# Patient Record
Sex: Male | Born: 1960
Health system: Southern US, Community
[De-identification: ages and names within clinical notes are randomized; demographics above are authoritative.]

## PROBLEM LIST (undated history)

## (undated) DIAGNOSIS — N184 Chronic kidney disease, stage 4 (severe): Secondary | ICD-10-CM

## (undated) DIAGNOSIS — Z9989 Dependence on other enabling machines and devices: Secondary | ICD-10-CM

## (undated) DIAGNOSIS — M1990 Unspecified osteoarthritis, unspecified site: Secondary | ICD-10-CM

## (undated) DIAGNOSIS — Z992 Dependence on renal dialysis: Secondary | ICD-10-CM

## (undated) DIAGNOSIS — Z8701 Personal history of pneumonia (recurrent): Secondary | ICD-10-CM

## (undated) DIAGNOSIS — F329 Major depressive disorder, single episode, unspecified: Secondary | ICD-10-CM

## (undated) DIAGNOSIS — I447 Left bundle-branch block, unspecified: Secondary | ICD-10-CM

## (undated) DIAGNOSIS — E118 Type 2 diabetes mellitus with unspecified complications: Secondary | ICD-10-CM

## (undated) DIAGNOSIS — G709 Myoneural disorder, unspecified: Secondary | ICD-10-CM

## (undated) DIAGNOSIS — R0600 Dyspnea, unspecified: Secondary | ICD-10-CM

## (undated) DIAGNOSIS — I209 Angina pectoris, unspecified: Secondary | ICD-10-CM

## (undated) DIAGNOSIS — E119 Type 2 diabetes mellitus without complications: Secondary | ICD-10-CM

## (undated) DIAGNOSIS — R011 Cardiac murmur, unspecified: Secondary | ICD-10-CM

## (undated) DIAGNOSIS — E538 Deficiency of other specified B group vitamins: Secondary | ICD-10-CM

## (undated) DIAGNOSIS — E13319 Other specified diabetes mellitus with unspecified diabetic retinopathy without macular edema: Secondary | ICD-10-CM

## (undated) DIAGNOSIS — I5032 Chronic diastolic (congestive) heart failure: Secondary | ICD-10-CM

## (undated) DIAGNOSIS — K219 Gastro-esophageal reflux disease without esophagitis: Secondary | ICD-10-CM

## (undated) DIAGNOSIS — N138 Other obstructive and reflux uropathy: Secondary | ICD-10-CM

## (undated) DIAGNOSIS — I5042 Chronic combined systolic (congestive) and diastolic (congestive) heart failure: Secondary | ICD-10-CM

## (undated) DIAGNOSIS — I2510 Atherosclerotic heart disease of native coronary artery without angina pectoris: Secondary | ICD-10-CM

## (undated) DIAGNOSIS — D649 Anemia, unspecified: Secondary | ICD-10-CM

## (undated) DIAGNOSIS — H4310 Vitreous hemorrhage, unspecified eye: Secondary | ICD-10-CM

## (undated) DIAGNOSIS — I10 Essential (primary) hypertension: Secondary | ICD-10-CM

## (undated) DIAGNOSIS — F32A Depression, unspecified: Secondary | ICD-10-CM

## (undated) DIAGNOSIS — R600 Localized edema: Secondary | ICD-10-CM

## (undated) DIAGNOSIS — Z794 Long term (current) use of insulin: Secondary | ICD-10-CM

## (undated) DIAGNOSIS — J189 Pneumonia, unspecified organism: Secondary | ICD-10-CM

## (undated) DIAGNOSIS — E1140 Type 2 diabetes mellitus with diabetic neuropathy, unspecified: Secondary | ICD-10-CM

## (undated) DIAGNOSIS — N400 Enlarged prostate without lower urinary tract symptoms: Secondary | ICD-10-CM

## (undated) DIAGNOSIS — E785 Hyperlipidemia, unspecified: Secondary | ICD-10-CM

## (undated) DIAGNOSIS — F419 Anxiety disorder, unspecified: Secondary | ICD-10-CM

## (undated) DIAGNOSIS — N401 Enlarged prostate with lower urinary tract symptoms: Secondary | ICD-10-CM

## (undated) HISTORY — DX: Myoneural disorder, unspecified: G70.9

## (undated) HISTORY — DX: Anxiety disorder, unspecified: F41.9

## (undated) HISTORY — DX: Depression, unspecified: F32.A

## (undated) HISTORY — DX: Gastro-esophageal reflux disease without esophagitis: K21.9

## (undated) HISTORY — DX: Other specified diabetes mellitus with unspecified diabetic retinopathy without macular edema: E13.319

## (undated) HISTORY — DX: Type 2 diabetes mellitus with diabetic neuropathy, unspecified: E11.40

## (undated) HISTORY — DX: Deficiency of other specified B group vitamins: E53.8

---
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
## 1999-06-11 ENCOUNTER — Emergency Department (HOSPITAL_COMMUNITY): Admission: EM | Admit: 1999-06-11 | Discharge: 1999-06-11 | Payer: Self-pay | Admitting: *Deleted

## 1999-06-11 ENCOUNTER — Encounter: Payer: Self-pay | Admitting: *Deleted

## 2010-01-31 ENCOUNTER — Emergency Department (HOSPITAL_COMMUNITY)
Admission: EM | Admit: 2010-01-31 | Discharge: 2010-01-31 | Payer: Self-pay | Source: Home / Self Care | Admitting: Family Medicine

## 2013-12-15 ENCOUNTER — Encounter (HOSPITAL_COMMUNITY): Payer: Self-pay | Admitting: Emergency Medicine

## 2013-12-15 ENCOUNTER — Emergency Department (INDEPENDENT_AMBULATORY_CARE_PROVIDER_SITE_OTHER)
Admission: EM | Admit: 2013-12-15 | Discharge: 2013-12-15 | Disposition: A | Discharge status: Home / Self Care | Payer: BC Managed Care – PPO | Source: Home / Self Care | Attending: Family Medicine | Admitting: Family Medicine

## 2013-12-15 DIAGNOSIS — R1032 Left lower quadrant pain: Secondary | ICD-10-CM

## 2013-12-15 HISTORY — DX: Essential (primary) hypertension: I10

## 2013-12-15 HISTORY — DX: Type 2 diabetes mellitus without complications: E11.9

## 2013-12-15 NOTE — ED Provider Notes (Signed)
BARTLETT MUHLENKAMP is a 53 y.o. male who presents to Urgent Care today for left-sided groin pain. Patient is a seven-day history of burning sensation in his left groin. He denies any injury. No fevers or chills. No bulging. No skin changes. No urinary changes. He feels well otherwise. He has a history of peripheral neuropathy due to diabetes. No new medications.   Past Medical History  Diagnosis Date  . Diabetes mellitus without complication   . Hypertension    History reviewed. No pertinent past surgical history. History  Substance Use Topics  . Smoking status: Never Smoker   . Smokeless tobacco: Not on file  . Alcohol Use: No   ROS as above Medications: No current facility-administered medications for this encounter.   Current Outpatient Prescriptions  Medication Sig Dispense Refill  . gabapentin (NEURONTIN) 300 MG capsule Take 300 mg by mouth 3 (three) times daily.    . hydrochlorothiazide (HYDRODIURIL) 12.5 MG tablet Take 12.5 mg by mouth daily.    . insulin aspart (NOVOLOG) 100 UNIT/ML injection Inject 50 Units into the skin 3 (three) times daily before meals.    [name] lisinopril (PRINIVIL,ZESTRIL) 40 MG tablet Take 40 mg by mouth daily.    . potassium citrate (UROCIT-K) 10 MEQ (1080 MG) SR tablet Take 10 mEq by mouth 3 (three) times daily with meals.     Allergies  Allergen Reactions  . Claritin [Loratadine]      Exam:  BP 145/93 mmHg  Pulse 85  Temp(Src) 98.2 F (36.8 C) (Oral)  Resp 16  SpO2 99% Gen: Well NAD HEENT: EOMI,  MMM Lungs: Normal work of breathing. CTABL Heart: RRR no MRG Abd: NABS, Soft. Nondistended, Nontender Exts: Brisk capillary refill, warm and well perfused.  Skin: Left groin normal-appearing. No bulging. No masses. Normal sensation. Genitals: No regional lymphadenopathy. No left-sided hernias. Testicle is normal to palpation without masses or tenderness. MSK: Mildly tender palpation insertion of the hip adductors. Hip range of motion is intact. No  pain with resisted hip adduction  No results found for this or any previous visit (from the past 24 hour(s)). No results found.  Assessment and Plan: 53 y.o. male with left groin pain. Unclear etiology. Possibly muscular possibly neuropathy. Plan for physical therapy. If not better follow-up with [facility] for nerve conduction testing.  Discussed warning signs or symptoms. Please see discharge instructions. Patient expresses understanding.     Gregor Hams, MD 12/15/13 [phone]

## 2013-12-15 NOTE — ED Notes (Signed)
C/o right sided groin pain x 7 days.  Denies urinary symptoms and discharge.

## 2013-12-15 NOTE — Discharge Instructions (Signed)
Thank you for coming in today. Go to physical therapy.  If not better follow up with Dr. Ermalene Postin at [facility].  Come back as needed.

## 2014-05-11 ENCOUNTER — Encounter: Payer: Self-pay | Admitting: Podiatry

## 2014-05-11 ENCOUNTER — Ambulatory Visit (INDEPENDENT_AMBULATORY_CARE_PROVIDER_SITE_OTHER): Payer: BLUE CROSS/BLUE SHIELD | Admitting: Podiatry

## 2014-05-11 VITALS — BP 192/96 | HR 104 | Ht 74.0 in | Wt 226.0 lb

## 2014-05-11 DIAGNOSIS — M21969 Unspecified acquired deformity of unspecified lower leg: Secondary | ICD-10-CM | POA: Diagnosis not present

## 2014-05-11 DIAGNOSIS — M216X2 Other acquired deformities of left foot: Secondary | ICD-10-CM | POA: Diagnosis not present

## 2014-05-11 DIAGNOSIS — M216X1 Other acquired deformities of right foot: Secondary | ICD-10-CM | POA: Diagnosis not present

## 2014-05-11 DIAGNOSIS — E1140 Type 2 diabetes mellitus with diabetic neuropathy, unspecified: Secondary | ICD-10-CM | POA: Insufficient documentation

## 2014-05-11 NOTE — Progress Notes (Signed)
Subjective: 54 year old male presents stating that he has diabetes and neuropathy. Feet are swelling with numbness and losing feeling. Diabetic x 19 years. Blood sugar is out of range for about a month. Average in 200.  Review of Systems - General ROS: negative Ophthalmic ROS: Recently had done Cataract surgery. ENT ROS: negative Hematological and Lymphatic ROS: negative Respiratory ROS: no cough, shortness of breath, or wheezing Cardiovascular ROS: no chest pain or dyspnea on exertion Gastrointestinal ROS: no abdominal pain, change in bowel habits, or black or bloody stools Genito-Urinary ROS: no dysuria, trouble voiding, or hematuria Musculoskeletal ROS: negative Neurological ROS: Numbness in hands and feet. Dermatological ROS: negative.  Objective: Dermatologic: Normal skin. No abnormal lesions. Vascular: All pedal pulses are palpable. No edema or erythema noted. 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 Contracted lesser digits left>right. Excess subtalar joint hyperpronation.  Normal ankle joint motion.  Neurologic: Failed to respond on Monofilament sensory testing 6 out of 6 bilateral.   Assessment: Diabetic not controlled. Hypermobile first ray bilateral. Peripheral neuropathy bilateral. Rule out tarsal tunnel syndrome.  Plan: Reviewed findings and available treatment options. Reviewed proper shoe gear. May benefit from custom orthotics to reduce STJ hyperpronation and reduce tension in Tarsal tunnel area.

## 2014-05-11 NOTE — Patient Instructions (Addendum)
Seen for diabetic foot with nerve problem. Noted of unstable foot and contracted toes. Need blood sugar under control and need custom orthotics. May benefit from Neuremedy.

## 2015-01-16 HISTORY — PX: CATARACT EXTRACTION W/ INTRAOCULAR LENS IMPLANT: SHX1309

## 2015-01-16 HISTORY — PX: CATARACT EXTRACTION: SUR2

## 2016-01-12 ENCOUNTER — Emergency Department (HOSPITAL_COMMUNITY): Payer: BLUE CROSS/BLUE SHIELD

## 2016-01-12 ENCOUNTER — Encounter (HOSPITAL_COMMUNITY): Payer: Self-pay | Admitting: Family Medicine

## 2016-01-12 ENCOUNTER — Emergency Department (HOSPITAL_COMMUNITY)
Admission: EM | Admit: 2016-01-12 | Discharge: 2016-01-12 | Disposition: A | Discharge status: Home / Self Care | Payer: BLUE CROSS/BLUE SHIELD | Attending: Emergency Medicine | Admitting: Emergency Medicine

## 2016-01-12 ENCOUNTER — Ambulatory Visit (HOSPITAL_COMMUNITY)
Admission: EM | Admit: 2016-01-12 | Discharge: 2016-01-12 | Disposition: A | Discharge status: Home / Self Care | Payer: BLUE CROSS/BLUE SHIELD | Attending: Family Medicine | Admitting: Family Medicine

## 2016-01-12 ENCOUNTER — Encounter (HOSPITAL_COMMUNITY): Payer: Self-pay | Admitting: Emergency Medicine

## 2016-01-12 DIAGNOSIS — E1140 Type 2 diabetes mellitus with diabetic neuropathy, unspecified: Secondary | ICD-10-CM | POA: Diagnosis not present

## 2016-01-12 DIAGNOSIS — R112 Nausea with vomiting, unspecified: Secondary | ICD-10-CM

## 2016-01-12 DIAGNOSIS — R739 Hyperglycemia, unspecified: Secondary | ICD-10-CM

## 2016-01-12 DIAGNOSIS — J3489 Other specified disorders of nose and nasal sinuses: Secondary | ICD-10-CM

## 2016-01-12 DIAGNOSIS — I10 Essential (primary) hypertension: Secondary | ICD-10-CM | POA: Insufficient documentation

## 2016-01-12 DIAGNOSIS — Z794 Long term (current) use of insulin: Secondary | ICD-10-CM | POA: Diagnosis not present

## 2016-01-12 DIAGNOSIS — E860 Dehydration: Secondary | ICD-10-CM | POA: Insufficient documentation

## 2016-01-12 DIAGNOSIS — Z79899 Other long term (current) drug therapy: Secondary | ICD-10-CM | POA: Insufficient documentation

## 2016-01-12 DIAGNOSIS — E1165 Type 2 diabetes mellitus with hyperglycemia: Secondary | ICD-10-CM | POA: Diagnosis not present

## 2016-01-12 DIAGNOSIS — R000 Tachycardia, unspecified: Secondary | ICD-10-CM

## 2016-01-12 LAB — URINALYSIS, ROUTINE W REFLEX MICROSCOPIC
BACTERIA UA: NONE SEEN
Bilirubin Urine: NEGATIVE
Glucose, UA: >=500 mg/dL — AB
HGB URINE DIPSTICK: NEGATIVE
Ketones, ur: 20 mg/dL — AB
Leukocytes, UA: NEGATIVE
Nitrite: NEGATIVE
PROTEIN: 100 mg/dL — AB
SPECIFIC GRAVITY, URINE: 1.025 (ref 1.005–1.030)
SQUAMOUS EPITHELIAL / LPF: NONE SEEN
pH: 6 (ref 5.0–8.0)

## 2016-01-12 LAB — BASIC METABOLIC PANEL
ANION GAP: 16 — AB (ref 5–15)
ANION GAP: 13 (ref 5–15)
BUN: 13 mg/dL (ref 6–20)
BUN: 10 mg/dL (ref 6–20)
CALCIUM: 8.9 mg/dL (ref 8.9–10.3)
CHLORIDE: 97 mmol/L — AB (ref 101–111)
CHLORIDE: 101 mmol/L (ref 101–111)
CO2: 22 mmol/L (ref 22–32)
CO2: 24 mmol/L (ref 22–32)
CREATININE: 1.13 mg/dL (ref 0.61–1.24)
Calcium: 9.3 mg/dL (ref 8.9–10.3)
Creatinine, Ser: 1.38 mg/dL — ABNORMAL HIGH (ref 0.61–1.24)
GFR calc non Af Amer: 56 mL/min — ABNORMAL LOW (ref >60)
GFR calc non Af Amer: >60 mL/min (ref >60)
GLUCOSE: 475 mg/dL — AB (ref 65–99)
Glucose, Bld: 359 mg/dL — ABNORMAL HIGH (ref 65–99)
POTASSIUM: 5.2 mmol/L — AB (ref 3.5–5.1)
Potassium: 3.9 mmol/L (ref 3.5–5.1)
SODIUM: 138 mmol/L (ref 135–145)
Sodium: 135 mmol/L (ref 135–145)

## 2016-01-12 LAB — CBC
HEMATOCRIT: 44.2 % (ref 39.0–52.0)
HEMOGLOBIN: 15.9 g/dL (ref 13.0–17.0)
MCH: 31.2 pg (ref 26.0–34.0)
MCHC: 36 g/dL (ref 30.0–36.0)
MCV: 86.8 fL (ref 78.0–100.0)
Platelets: 342 10*3/uL (ref 150–400)
RBC: 5.09 MIL/uL (ref 4.22–5.81)
RDW: 12.1 % (ref 11.5–15.5)
WBC: 8.3 10*3/uL (ref 4.0–10.5)

## 2016-01-12 LAB — POCT I-STAT, CHEM 8
BUN: 14 mg/dL (ref 6–20)
CHLORIDE: 98 mmol/L — AB (ref 101–111)
CREATININE: 1.1 mg/dL (ref 0.61–1.24)
Calcium, Ion: 1.12 mmol/L — ABNORMAL LOW (ref 1.15–1.40)
Glucose, Bld: 574 mg/dL (ref 65–99)
HEMATOCRIT: 47 % (ref 39.0–52.0)
Hemoglobin: 16 g/dL (ref 13.0–17.0)
Potassium: 4.5 mmol/L (ref 3.5–5.1)
Sodium: 136 mmol/L (ref 135–145)
TCO2: 25 mmol/L (ref 0–100)

## 2016-01-12 LAB — I-STAT VENOUS BLOOD GAS, ED
ACID-BASE DEFICIT: 2 mmol/L (ref 0.0–2.0)
Bicarbonate: 23.4 mmol/L (ref 20.0–28.0)
O2 Saturation: 86 %
PH VEN: 7.359 (ref 7.250–7.430)
PO2 VEN: 54 mmHg — AB (ref 32.0–45.0)
TCO2: 25 mmol/L (ref 0–100)
pCO2, Ven: 41.6 mmHg — ABNORMAL LOW (ref 44.0–60.0)

## 2016-01-12 LAB — CBG MONITORING, ED: GLUCOSE-CAPILLARY: 329 mg/dL — AB (ref 65–99)

## 2016-01-12 LAB — LIPASE, BLOOD: LIPASE: 20 U/L (ref 11–51)

## 2016-01-12 MED ORDER — SODIUM CHLORIDE 0.9 % IV BOLUS (SEPSIS)
1000.0000 mL | Freq: Once | INTRAVENOUS | Status: AC
  Administered 2016-01-12: 1000 mL via INTRAVENOUS

## 2016-01-12 MED ORDER — ONDANSETRON 4 MG PO TBDP
ORAL_TABLET | ORAL | Status: AC
  Filled 2016-01-12: qty 1

## 2016-01-12 MED ORDER — GABAPENTIN 600 MG PO TABS: 300.0000 mg | ORAL_TABLET | Freq: Once | ORAL | Status: DC

## 2016-01-12 MED ORDER — GABAPENTIN 600 MG PO TABS
300.0000 mg | ORAL_TABLET | Freq: Once | ORAL | Status: DC
  Filled 2016-01-12: qty 0.5

## 2016-01-12 MED ORDER — INSULIN ASPART PROT & ASPART (70-30 MIX) 100 UNIT/ML ~~LOC~~ SUSP
30.0000 [IU] | Freq: Once | SUBCUTANEOUS | Status: AC
  Administered 2016-01-12: 30 [IU] via SUBCUTANEOUS
  Filled 2016-01-12: qty 10

## 2016-01-12 MED ORDER — ONDANSETRON 4 MG PO TBDP
4.0000 mg | ORAL_TABLET | Freq: Once | ORAL | Status: AC
  Administered 2016-01-12: 4 mg via ORAL

## 2016-01-12 MED ORDER — INSULIN ASPART 100 UNIT/ML ~~LOC~~ SOLN: 30.0000 [IU] | Freq: Once | SUBCUTANEOUS | Status: DC

## 2016-01-12 MED ORDER — GABAPENTIN 300 MG PO CAPS
300.0000 mg | ORAL_CAPSULE | Freq: Once | ORAL | Status: AC
Start: 2016-01-12 — End: 2016-01-12
  Administered 2016-01-12: 300 mg via ORAL
  Filled 2016-01-12: qty 1

## 2016-01-12 MED ORDER — ONDANSETRON 4 MG PO TBDP: 4.0000 mg | ORAL_TABLET | Freq: Three times a day (TID) | ORAL | 0 refills | Status: DC | PRN

## 2016-01-12 MED ORDER — LACTATED RINGERS IV BOLUS (SEPSIS)
1000.0000 mL | Freq: Once | INTRAVENOUS | Status: AC
  Administered 2016-01-12: 1000 mL via INTRAVENOUS

## 2016-01-12 NOTE — ED Provider Notes (Signed)
CSN: [id number]     Arrival date & time 01/12/16  1153 History   First MD Initiated Contact with Patient 01/12/16 1252     Chief Complaint  Patient presents with  . Cough  . Nasal Congestion   (Consider location/radiation/quality/duration/timing/severity/associated sxs/prior Treatment) 55 year old male's states he is here for "dehydration". Said vomiting for 2 days and then it unable to hold any fluids down. Also complaining of epigastric discomfort. States he has not had a bowel movement for 2 days. Denies fever but he does have a runny nose. Denies sore throat, shortness of breath. He occasionally has mid anterior chest pain tends to come and go and describes it as a soreness. Started last evening. Currently afebrile. Heart rate noted to be 135-140. States he has not taken his lisinopril for at least 2 months. And although his medication list metoprolol he states he is unfamiliar with that medication by that or other names. He states he is not taking it.  Has a history of hypertension currently uncontrolled and tucked 2 diabetes mellitus.      Past Medical History:  Diagnosis Date  . Diabetes mellitus without complication ([place])   . Hypertension    History reviewed. No pertinent surgical history. History reviewed. No pertinent family history. Social History  Substance Use Topics  . Smoking status: Never Smoker  . Smokeless tobacco: Never Used  . Alcohol use No    Review of Systems  Constitutional: Positive for activity change and appetite change. Negative for fever.  HENT: Negative for ear discharge, ear pain and postnasal drip.   Respiratory: Negative for cough and shortness of breath.   Cardiovascular: Positive for chest pain. Negative for leg swelling.  Gastrointestinal: Positive for abdominal pain, constipation, nausea and vomiting.  Genitourinary: Negative.   Musculoskeletal: Negative.   Skin: Negative.   Neurological: Positive for dizziness. Negative for syncope and  speech difficulty.    Allergies  Claritin [loratadine]  Home Medications   Prior to Admission medications   Medication Sig Start Date End Date Taking? Authorizing Provider  diclofenac (VOLTAREN) 50 MG EC tablet  03/21/14   Historical Provider, MD  gabapentin (NEURONTIN) 300 MG capsule Take 300 mg by mouth 3 (three) times daily.    Historical Provider, MD  hydrochlorothiazide (HYDRODIURIL) 12.5 MG tablet Take 12.5 mg by mouth daily.    Historical Provider, MD  insulin aspart (NOVOLOG) 100 UNIT/ML injection Inject 50 Units into the skin 3 (three) times daily before meals.    Historical Provider, MD  LEVEMIR FLEXTOUCH 100 UNIT/ML Pen  04/14/14   Historical Provider, MD  lisinopril (PRINIVIL,ZESTRIL) 40 MG tablet Take 40 mg by mouth daily.    Historical Provider, MD  metFORMIN (GLUCOPHAGE) 500 MG tablet  04/27/14   Historical Provider, MD  metoprolol (LOPRESSOR) 50 MG tablet  03/21/14   Historical Provider, MD  potassium chloride (MICRO-K) 10 MEQ CR capsule  03/21/14   Historical Provider, MD   Meds Ordered and Administered this Visit   Medications  ondansetron (ZOFRAN-ODT) disintegrating tablet 4 mg (4 mg Oral Given 01/12/16 1324)  sodium chloride 0.9 % bolus 1,000 mL (1,000 mLs Intravenous Given 01/12/16 1324)    BP (!) 184/110 (BP Location: Right Arm)   Pulse 120   Temp 98.9 F (37.2 C)   Resp 18   SpO2 100%  Orthostatic VS for the past 24 hrs:  BP- Lying Pulse- Lying BP- Sitting Pulse- Sitting BP- Standing at 0 minutes Pulse- Standing at 0 minutes  01/12/16 1237 (!) 148/99  137 (!) 159/114 138 (!) 152/92 145    Physical Exam  Constitutional: He is oriented to person, place, and time. He appears well-developed and well-nourished. No distress.  Fully awake, alert and oriented. Talkative. Speech is clear and energetic. Does not appear toxic or lethargic. Skin is warm and dry showing no signs of distress.  HENT:  Head: Normocephalic and atraumatic.  Mouth/Throat: Oropharynx is clear and  moist.  Eyes: EOM are normal.  Neck: Normal range of motion. Neck supple.  Cardiovascular: Regular rhythm.   Tachycardia.  Pulmonary/Chest: Effort normal and breath sounds normal. No respiratory distress. He has no wheezes.  Abdominal: Soft. There is no rebound and no guarding.  Mild epigastric tenderness.  Musculoskeletal: Normal range of motion. He exhibits no edema.  Neurological: He is alert and oriented to person, place, and time. No cranial nerve deficit.  Skin: Skin is warm and dry.  Psychiatric: He has a normal mood and affect.  Nursing note and vitals reviewed.   Urgent Care Course   Clinical Course   ED ECG REPORT   Date: 01/12/2016  Rate: 135  Rhythm: sinus tachycardia  QRS Axis: normal  Intervals: normal  ST/T Wave abnormalities: nonspecific T wave changes  Conduction Disutrbances:none  Narrative Interpretation:   Old EKG Reviewed: none available  I have personally reviewed the EKG tracing and agree with the computerized printout as noted.   Procedures (including critical care time)  Labs Review Labs Reviewed  POCT I-STAT, CHEM 8 - Abnormal; Notable for the following:       Result Value   Chloride 98 (*)    Glucose, Bld 574 (*)    Calcium, Ion 1.12 (*)    All other components within normal limits   Results for orders placed or performed during the hospital encounter of 01/12/16  I-STAT, chem 8  Result Value Ref Range   Sodium 136 135 - 145 mmol/L   Potassium 4.5 3.5 - 5.1 mmol/L   Chloride 98 (L) 101 - 111 mmol/L   BUN 14 6 - 20 mg/dL   Creatinine, Ser 1.10 0.61 - 1.24 mg/dL   Glucose, Bld 574 (HH) 65 - 99 mg/dL   Calcium, Ion 1.12 (L) 1.15 - 1.40 mmol/L   TCO2 25 0 - 100 mmol/L   Hemoglobin 16.0 13.0 - 17.0 g/dL   HCT 47.0 39.0 - 52.0 %   Comment NOTIFIED PHYSICIAN      Imaging Review No results found.   Visual Acuity Review  Right Eye Distance:   Left Eye Distance:   Bilateral Distance:    Right Eye Near:   Left Eye Near:     Bilateral Near:         MDM   1. Dehydration   2. Type 2 diabetes mellitus with hyperglycemia, with long-term current use of insulin (HCC)   3. Non-intractable vomiting with nausea, unspecified vomiting type   4. Sinus tachycardia   5. Rhinorrhea   6. Essential hypertension    Patient received 1 L of normal saline IV. I-STAT  blood sugar of 574. Heart rate remains in the 120s. The patient remains fully awake and alert, calm and with no other complaints. It is explained to him that he needs to go to the emergency Department for additional management of his dehydration and hyperglycemia. Patient is in  agreement.    Janne Napoleon, NP 01/12/16 [phone]

## 2016-01-12 NOTE — Discharge Instructions (Signed)
Please be sure to remain compliant with your insulin and other medications. Please follow-up with your primary doctor as soon as possible to discuss whether or not changes to your medications need to be made.

## 2016-01-12 NOTE — ED Provider Notes (Signed)
I have personally seen and examined the patient. I have reviewed the documentation on PMH/FH/Soc Hx. I have discussed the plan of care with the resident and patient.  I have reviewed and agree with the resident's documentation. Please see associated encounter note.   EKG Interpretation None         Fatima Blank, MD 01/12/16 2136

## 2016-01-12 NOTE — ED Triage Notes (Addendum)
Pt here for URI symptoms and sts his neuropathy is flaring up. sts he taking medication daily but not helping. Pt is dizzy when standing, tachy at triage and sts that he has been vomiting. Took ibuprofen this am.

## 2016-01-12 NOTE — ED Triage Notes (Signed)
Pt sent here from urgent care for possible dehydration due to HR 130 and pt has history of cold symptoms and vomiting x3 days. 20 G in left a/c.

## 2016-01-12 NOTE — ED Provider Notes (Signed)
[facility] DEPT Provider Note   CSN: [id number] Arrival date & time: 01/12/16  1512     History   Chief Complaint Chief Complaint  Patient presents with  . Dehydration    HPI Ryan Jenkins is a 55 y.o. male.  The history is provided by the patient and medical records. No language interpreter was used.  This is a 55 year old male with a past medical history diabetes currently on metformin and Levemir 7030 who presents today with 3 days of nausea, vomiting, cough, congestion and hyperglycemia. Was seen here for urgent care given his blood sugars being in the 500s. States that he is generally compliant with his diabetes medications but he does not always take his insulin. He denies any known fevers but states he has had cough with production of yellow sputum. He is also had chills associated with this. Denies any new chest pain. Endorses epigastric pain associated with this as well. Is having increased urination but no dysuria or hematuria. No increased tone in his legs. No worsening or alleviating factors.  Past Medical History:  Diagnosis Date  . Diabetes mellitus without complication ([place])   . Hypertension     Patient Active Problem List   Diagnosis Date Noted  . 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 Diabetic neuropathy, type II diabetes mellitus ([place]) 05/11/2014  . 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 No pertinent surgical history.     Home Medications    Prior to Admission medications   Medication Sig Start Date End Date Taking? Authorizing Provider  gabapentin (NEURONTIN) 300 MG capsule Take 300 mg by mouth 3 (three) times daily.   Yes Historical Provider, MD  hydrochlorothiazide (HYDRODIURIL) 12.5 MG tablet Take 12.5 mg by mouth daily.   Yes Historical Provider, MD  insulin aspart protamine- aspart (NOVOLOG MIX 70/30) (70-30) 100 UNIT/ML injection Inject 30 Units into the skin 2 (two) times daily with a meal.   Yes Historical  Provider, MD  metFORMIN (GLUCOPHAGE) 500 MG tablet Take 500 mg by mouth daily with breakfast.  04/27/14  Yes Historical Provider, MD  lisinopril (PRINIVIL,ZESTRIL) 40 MG tablet Take 40 mg by mouth daily.    Historical Provider, MD  metoprolol (LOPRESSOR) 50 MG tablet  03/21/14   Historical Provider, MD  ondansetron (ZOFRAN ODT) 4 MG disintegrating tablet Take 1 tablet (4 mg total) by mouth every 8 (eight) hours as needed for nausea or vomiting. 01/12/16   Theodosia Quay, MD  potassium chloride (MICRO-K) 10 MEQ CR capsule  03/21/14   Historical Provider, MD    Family History No family history on file.  Social History Social History  Substance Use Topics  . Smoking status: Never Smoker  . Smokeless tobacco: Never Used  . Alcohol use No     Allergies   Claritin [loratadine]   Review of Systems Review of Systems  Constitutional: Positive for chills. Negative for fever.  HENT: Positive for congestion. Negative for ear pain and sore throat.   Eyes: Negative for pain and visual disturbance.  Respiratory: Positive for cough. Negative for shortness of breath.   Cardiovascular: Negative for chest pain and palpitations.  Gastrointestinal: Positive for abdominal pain (epigastric), nausea and vomiting.  Endocrine: Positive for polyuria.  Genitourinary: Negative for dysuria and hematuria.  Musculoskeletal: Negative for arthralgias and back pain.  Skin: Negative for color change and rash.  Neurological: Positive for light-headedness. Negative for seizures and syncope.  All other systems reviewed and are negative.    Physical Exam  Updated Vital Signs BP 186/93 (BP Location: Left Arm)   Pulse 119   Temp 98.3 F (36.8 C) (Oral)   Resp 14   SpO2 99%   Physical Exam  Constitutional: He is oriented to person, place, and time. He appears well-developed and well-nourished.  HENT:  Head: Normocephalic and atraumatic.  Eyes: Conjunctivae are normal.  Neck: Neck supple.  Cardiovascular:  Regular rhythm.  Tachycardia present.   No murmur heard. Pulmonary/Chest: Effort normal and breath sounds normal. No respiratory distress.  Abdominal: Soft. Bowel sounds are normal. He exhibits no distension. There is tenderness (mild epigastric ttp). There is no guarding.  Musculoskeletal: Normal range of motion. He exhibits no edema.  Neurological: He is alert and oriented to person, place, and time.  Skin: Skin is warm and dry.  Psychiatric: He has a normal mood and affect.  Nursing note and vitals reviewed.    ED Treatments / Results  Labs (all labs ordered are listed, but only abnormal results are displayed) Labs Reviewed  BASIC METABOLIC PANEL - Abnormal; Notable for the following:       Result Value   Potassium 5.2 (*)    Chloride 97 (*)    Glucose, Bld 475 (*)    Creatinine, Ser 1.38 (*)    GFR calc non Af Amer 56 (*)    Anion gap 16 (*)    All other components within normal limits  URINALYSIS, ROUTINE W REFLEX MICROSCOPIC - Abnormal; Notable for the following:    Color, Urine STRAW (*)    Glucose, UA >=500 (*)    Ketones, ur 20 (*)    Protein, ur 100 (*)    All other components within normal limits  BASIC METABOLIC PANEL - Abnormal; Notable for the following:    Glucose, Bld 359 (*)    All other components within normal limits  I-STAT VENOUS BLOOD GAS, ED - Abnormal; Notable for the following:    pCO2, Ven 41.6 (*)    pO2, Ven 54.0 (*)    All other components within normal limits  CBG MONITORING, ED - Abnormal; Notable for the following:    Glucose-Capillary 329 (*)    All other components within normal limits  CBC  LIPASE, BLOOD  BLOOD GAS, VENOUS    EKG  EKG Interpretation None       Radiology Dg Chest Portable 1 View  Result Date: 01/12/2016 CLINICAL DATA:  Cough, possible dehydration EXAM: PORTABLE CHEST 1 VIEW COMPARISON:  None. FINDINGS: The heart size and mediastinal contours are within normal limits. Both lungs are clear. The visualized  skeletal structures are unremarkable. IMPRESSION: No active disease. Electronically Signed   By: Lahoma Crocker M.D.   On: 01/12/2016 17:07    Procedures Procedures (including critical care time)  Medications Ordered in ED Medications  lactated ringers bolus 1,000 mL (0 mLs Intravenous Stopped 01/12/16 2131)  gabapentin (NEURONTIN) capsule 300 mg (300 mg Oral Given 01/12/16 2143)  insulin aspart protamine- aspart (NOVOLOG MIX 70/30) injection 30 Units (30 Units Subcutaneous Given 01/12/16 2307)     Initial Impression / Assessment and Plan / ED Course  I have reviewed the triage vital signs and the nursing notes.  Pertinent labs & imaging results that were available during my care of the patient were reviewed by me and considered in my medical decision making (see chart for details).  Clinical Course     Patient is a 55 year old male who presents today with hyperglycemia and concern for dehydration. He has had  nausea and vomiting recently and decreased intake by mouth.  On my exam the patient is tachycardic but otherwise in no acute distress. He has mild tenderness to his epigastric region, obtained a lipase which is negative and therefore doubt pancreatitis as a cause of this pain. He is given IV fluids here and after 2 L his symptoms did improve. His initial labs were concerning for an anion gap of 16. His bicarbonate was within appropriate limits. Venous blood gas demonstrates a pH which is within normal limits. Findings do not seem consistent with florid DKA.  On recheck of the patient's labs after getting IV fluids, his anion gap has resolved. His blood sugar has significantly improved to 359. At this point, patient was given a dose of his home NovoLog 7030. He is also noted to be tolerating food and drink by mouth.  Given his reassuring symptoms, feel it would be reasonable to discharge home at this point with Zofran for symptomatic control. Patient is agreeable with this. We discussed  compliance with his medications. Recommended follow-up with his primary doctor to discuss whether or not changes to his insulin regimen need to be made in the context of his hyperglycemia. Patient is agreeable and discharged home in good condition.  Final Clinical Impressions(s) / ED Diagnoses   Final diagnoses:  Dehydration  Hyperglycemia    New Prescriptions Discharge Medication List as of 01/12/2016  9:39 PM       Theodosia Quay, MD 01/13/16 0001    Fatima Blank, MD 01/14/16 0157

## 2016-01-12 NOTE — ED Notes (Signed)
Pt made aware of need to recheck blood sugar in 30 mins. Pt refused to stay. Pt made aware of risks. Return precautions reviewed.

## 2017-01-25 ENCOUNTER — Ambulatory Visit: Payer: Self-pay | Attending: Internal Medicine

## 2017-02-05 ENCOUNTER — Ambulatory Visit: Payer: Self-pay | Attending: Internal Medicine | Admitting: Internal Medicine

## 2017-02-05 ENCOUNTER — Ambulatory Visit: Payer: Self-pay | Attending: Internal Medicine | Admitting: Licensed Clinical Social Worker

## 2017-02-05 ENCOUNTER — Encounter: Payer: Self-pay | Admitting: Internal Medicine

## 2017-02-05 VITALS — BP 175/98 | HR 107 | Temp 98.3°F | Resp 16 | Ht 74.0 in | Wt 203.0 lb

## 2017-02-05 DIAGNOSIS — R634 Abnormal weight loss: Secondary | ICD-10-CM | POA: Insufficient documentation

## 2017-02-05 DIAGNOSIS — R269 Unspecified abnormalities of gait and mobility: Secondary | ICD-10-CM | POA: Insufficient documentation

## 2017-02-05 DIAGNOSIS — E1165 Type 2 diabetes mellitus with hyperglycemia: Secondary | ICD-10-CM | POA: Insufficient documentation

## 2017-02-05 DIAGNOSIS — F4323 Adjustment disorder with mixed anxiety and depressed mood: Secondary | ICD-10-CM

## 2017-02-05 DIAGNOSIS — I10 Essential (primary) hypertension: Secondary | ICD-10-CM | POA: Insufficient documentation

## 2017-02-05 DIAGNOSIS — F32A Depression, unspecified: Secondary | ICD-10-CM | POA: Insufficient documentation

## 2017-02-05 DIAGNOSIS — E1142 Type 2 diabetes mellitus with diabetic polyneuropathy: Secondary | ICD-10-CM | POA: Insufficient documentation

## 2017-02-05 DIAGNOSIS — Z794 Long term (current) use of insulin: Secondary | ICD-10-CM | POA: Insufficient documentation

## 2017-02-05 DIAGNOSIS — E119 Type 2 diabetes mellitus without complications: Secondary | ICD-10-CM | POA: Insufficient documentation

## 2017-02-05 DIAGNOSIS — F329 Major depressive disorder, single episode, unspecified: Secondary | ICD-10-CM | POA: Insufficient documentation

## 2017-02-05 DIAGNOSIS — Z7982 Long term (current) use of aspirin: Secondary | ICD-10-CM | POA: Insufficient documentation

## 2017-02-05 DIAGNOSIS — Z79899 Other long term (current) drug therapy: Secondary | ICD-10-CM | POA: Insufficient documentation

## 2017-02-05 DIAGNOSIS — Z114 Encounter for screening for human immunodeficiency virus [HIV]: Secondary | ICD-10-CM | POA: Insufficient documentation

## 2017-02-05 DIAGNOSIS — IMO0002 Reserved for concepts with insufficient information to code with codable children: Secondary | ICD-10-CM

## 2017-02-05 DIAGNOSIS — Z23 Encounter for immunization: Secondary | ICD-10-CM | POA: Insufficient documentation

## 2017-02-05 DIAGNOSIS — Z1159 Encounter for screening for other viral diseases: Secondary | ICD-10-CM

## 2017-02-05 LAB — POCT GLYCOSYLATED HEMOGLOBIN (HGB A1C): Hemoglobin A1C: 11.2

## 2017-02-05 LAB — GLUCOSE, POCT (MANUAL RESULT ENTRY): POC GLUCOSE: 156 mg/dL — AB (ref 70–99)

## 2017-02-05 MED ORDER — INSULIN GLARGINE 100 UNIT/ML SOLOSTAR PEN: 30.0000 [IU] | PEN_INJECTOR | Freq: Every day | SUBCUTANEOUS | 3 refills | Status: DC

## 2017-02-05 MED ORDER — TRUEPLUS LANCETS 28G MISC: 1 refills | Status: DC

## 2017-02-05 MED ORDER — GLUCOSE BLOOD VI STRP: ORAL_STRIP | 12 refills | Status: DC

## 2017-02-05 MED ORDER — AMLODIPINE BESYLATE 5 MG PO TABS: 5.0000 mg | ORAL_TABLET | Freq: Every day | ORAL | 3 refills | Status: DC

## 2017-02-05 MED ORDER — ASPIRIN EC 81 MG PO TBEC: 81.0000 mg | DELAYED_RELEASE_TABLET | Freq: Every day | ORAL | 4 refills | Status: DC

## 2017-02-05 MED ORDER — SERTRALINE HCL 50 MG PO TABS: ORAL_TABLET | ORAL | 3 refills | Status: DC

## 2017-02-05 MED ORDER — PEN NEEDLES 31G X 8 MM MISC: 6 refills | Status: DC

## 2017-02-05 MED ORDER — LISINOPRIL 5 MG PO TABS: 5.0000 mg | ORAL_TABLET | Freq: Every day | ORAL | 6 refills | Status: DC

## 2017-02-05 MED ORDER — TRUE METRIX METER W/DEVICE KIT: PACK | 0 refills | Status: DC

## 2017-02-05 MED ORDER — INSULIN ASPART 100 UNIT/ML FLEXPEN: PEN_INJECTOR | SUBCUTANEOUS | 11 refills | Status: DC

## 2017-02-05 MED ORDER — GABAPENTIN 300 MG PO CAPS: ORAL_CAPSULE | ORAL | 3 refills | Status: DC

## 2017-02-05 MED ORDER — TETANUS-DIPHTH-ACELL PERTUSSIS 5-2.5-18.5 LF-MCG/0.5 IM SUSP: 0.5000 mL | Freq: Once | INTRAMUSCULAR | 0 refills | Status: AC

## 2017-02-05 MED FILL — LISINOPRIL 5 MG TABLET: 5 | 30 days supply | Qty: 30 | Fill #0

## 2017-02-05 MED FILL — SERTRALINE HCL 50 MG TABLET: 50 | 30 days supply | Qty: 30 | Fill #0

## 2017-02-05 MED FILL — !LANTUS SOLOSTAR 100UNITS/M: 100 | 30 days supply | Qty: 9 | Fill #0

## 2017-02-05 MED FILL — !TRUE METRIX BLOOD GLUCOSE: 1 days supply | Qty: 1 | Fill #0

## 2017-02-05 MED FILL — TRUE METRIX TEST STRIP: 30 days supply | Qty: 100 | Fill #0

## 2017-02-05 MED FILL — ?AMLODIPINE BESYLATE 5 MG T: 5 MG | 30 days supply | Qty: 30 | Fill #0

## 2017-02-05 MED FILL — GABAPENTIN 300 MG CAPSULE: 300 | 30 days supply | Qty: 60 | Fill #0

## 2017-02-05 MED FILL — TRUEplus LANCETS 28G MISC: 30 days supply | Qty: 100 | Fill #0

## 2017-02-05 MED FILL — !HUMALOG 100 UNITS/ML KWIKP: 100 | 28 days supply | Qty: 3 | Fill #0

## 2017-02-05 NOTE — Progress Notes (Signed)
Patient ID: Ryan Jenkins, male    DOB: 01/29/1960  MRN: [id number]  CC: New Patient (Initial Visit); Hypertension; and Diabetes   Subjective: Ryan Jenkins is a 57 y.o. male who presents for new pt visit. Previous doctor was at [facility]. Last seen 2 yrs ago. Loss insurance.  His concerns today include:  Pt with hx of DM with neuropathy (in feet and hands) and HTN  1.  DM: currently taking Lantus 30 units once a day and Metformin 578m daily for past 2 mths -no device to check BS Eating habits: 2 meals a day.  Thinks he eats balance meals.  Not exercising "because I be so off balance." Loss 65 lbs partially intentional but for most part unintentional over past yr No polyuria/polydipsia or blurred vision.  last eye exam 2 yrs ago -pain/tingling/numbness in hands and feet. Ws on Gabapentin in past.  Lyrica caused depression  2.  HTN:  Out of all meds x 2 yrs -no CP/SOB.  Occasion LE edema -tries to limit salt   3.  Pos Depression screen:  "just life circumstances."  Going through a a divorce and not having a job.  Appetite ok. Not sleeping well.  "I'm just awake trying to get my mind to relax."  Also pain in extremities.  -denies SI/HI  HM:  Due for flu vaccine, Pneumovax, tdap Had colonoscopy 1 yr ago through [facility]  Had 5 polyps removed.  Told he needs repeat in 10 yrs He has turned in application for [facility] discount but needs one more supporting document  Patient Active Problem List   Diagnosis Date Noted  . 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 Diabetic neuropathy, type II diabetes mellitus ([place] 05/11/2014  . Metatarsal deformity 05/11/2014     Current Outpatient Medications on File Prior to Visit  Medication Sig Dispense Refill  . metFORMIN (GLUCOPHAGE) 500 MG tablet Take 500 mg by mouth daily with breakfast.   0   No current facility-administered medications on file prior to visit.     Allergies  Allergen Reactions  . Claritin [Loratadine]  Swelling    Joint swelling     Social History   Socioeconomic History  . Marital status: Single    Spouse name: Not on file  . Number of children: Not on file  . Years of education: 154 . Highest education level: Not on file  Social Needs  . Financial resource strain: Not on file  . Food insecurity - worry: Not on file  . Food insecurity - inability: Not on file  . Transportation needs - medical: Not on file  . Transportation needs - non-medical: Not on file  Occupational History  . 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 Smoking status: Never Smoker  . Smokeless tobacco: Never Used  Substance and Sexual Activity  . Alcohol use: No    Alcohol/week: 0.0 oz  . Drug use: No  . Sexual activity: Yes  Other Topics Concern  . Not on file  Social History Narrative  . Not on file    Family History  Problem Relation Age of Onset  . Renal cancer Mother   . Hypertension Mother   . Hypertension Sister   . Leukemia Maternal Uncle     Past Surgical History:  Procedure Laterality Date  . CATARACT EXTRACTION Bilateral 2017    ROS: Review of Systems  Respiratory: Negative for cough, chest tightness and shortness of  breath.   Gastrointestinal: Positive for abdominal pain (some pain RUQ x 3 mths. Constant. no difference with food). Negative for blood in stool, diarrhea, nausea and vomiting.  Endocrine: Positive for cold intolerance. Negative for polydipsia, polyphagia and polyuria.  Genitourinary: Negative for difficulty urinating and hematuria.  Neurological: Positive for numbness (hands and feet). Negative for syncope.       [place] in shower once last yr in June.  Feeling off balance for over 1 yr.  Psychiatric/Behavioral: Positive for dysphoric mood.    PHYSICAL EXAM: BP (!) 175/98   Pulse (!) 107   Temp 98.3 F (36.8 C) (Oral)   Resp 16   Ht 6' 2"  (1.88 m)   Wt 203 lb (92.1 kg)   SpO2 100%   BMI 26.06 kg/m   Physical Exam  General  appearance - alert, well appearing, AAM who looks older than stated age and in no distress Mental status - alert, oriented to person, place, and time.  Flatt affect and quite demeanor.  NL behavior, speech, dress, motor activity, and thought processes Eyes -looks like he has had lens implant.  EOMI Nose - normal and patent, no erythema, discharge or polyps Mouth - mucous membranes moist, pharynx normal without lesions Neck - no thyroid enlargement or nodules Lymphatics -no cervical or axillary LN Chest - clear to auscultation, no wheezes, rales or rhonchi, symmetric air entry Heart - mild tachycardia, regular rhythm, normal S1, S2, no murmurs, rubs, clicks or gallops Abdomen - soft, nontender, nondistended, no masses or organomegaly.  Negative Murphy's Neurological - Romberg mild swaying back but pt was opening eyes intermittently.  Gait - shuffling.  No tremors. Power 5/5 through out.  Sensation - gross sensation dec in lower extremities.   Extremities -no LE edema Diabetic Foot Exam - Simple   Simple Foot Form Visual Inspection No deformities, no ulcerations, no other skin breakdown bilaterally:  Yes Sensation Testing See comments:  Yes Pulse Check Posterior Tibialis and Dorsalis pulse intact bilaterally:  Yes Comments LEAP markedly abnormal with dec sensation on dorsum and plantar surfaces     Results for orders placed or performed in visit on 02/05/17  POCT glucose (manual entry)  Result Value Ref Range   POC Glucose 156 (A) 70 - 99 mg/dl  POCT glycosylated hemoglobin (Hb A1C)  Result Value Ref Range   Hemoglobin A1C 11.2     BS 153  Depression screen [facility] 2/9 02/05/2017  Decreased Interest 2  Down, Depressed, Hopeless 1  PHQ - 2 Score 3  Altered sleeping 1  Tired, decreased energy 3  Change in appetite 1  Feeling bad or failure about yourself  2  Trouble concentrating 1  Moving slowly or fidgety/restless 1  Suicidal thoughts 0  PHQ-9 Score 12    ASSESSMENT AND  PLAN: 1. Uncontrolled type 2 diabetes mellitus with peripheral neuropathy (HCC) Continue Metformin and Lantus.  Add low dose Novolog with 2 largest meals Check BS 2-3 times a day and bring in readings in 2 wks with clinical pharmacist.  Martin Majestic over signs/symptoms of hypoglycemia and how to treat -needs eye exam. Pt told to have done at [facility] or [place] -Restart Gabapentin for neuropathy symptoms.  Would benefit from DM shoes but no insurance.  Advised to wear closed in shoes at all times.  - POCT glucose (manual entry) - POCT glycosylated hemoglobin (Hb A1C) - Microalbumin / creatinine urine ratio - Comprehensive metabolic panel - CBC - Lipid panel - Insulin Glargine (LANTUS SOLOSTAR) 100 UNIT/ML  Solostar Pen; Inject 30 Units into the skin daily at 10 pm.  Dispense: 5 pen; Refill: 3 - insulin aspart (NOVOLOG FLEXPEN) 100 UNIT/ML FlexPen; 5 units twice a day with the two largest meals  Dispense: 15 mL; Refill: 11 - gabapentin (NEURONTIN) 300 MG capsule; 1 cap PO QHS x 1 wk then BID  Dispense: 60 capsule; Refill: 3 - Blood Glucose Monitoring Suppl (TRUE METRIX METER) w/Device KIT; Use as directed  Dispense: 1 kit; Refill: 0 - glucose blood (TRUE METRIX BLOOD GLUCOSE TEST) test strip; Use as instructed  Dispense: 100 each; Refill: 12 - TRUEPLUS LANCETS 28G MISC; Use as directed  Dispense: 100 each; Refill: 1  2. Essential hypertension Not at goal. Start low dose Lisinopril/Norvasc.  Low salt diet - lisinopril (PRINIVIL,ZESTRIL) 5 MG tablet; Take 1 tablet (5 mg total) by mouth daily.  Dispense: 30 tablet; Refill: 6 - amLODipine (NORVASC) 5 MG tablet; Take 1 tablet (5 mg total) by mouth daily.  Dispense: 90 tablet; Refill: 3 - aspirin EC 81 MG tablet; Take 1 tablet (81 mg total) by mouth daily.  Dispense: 100 tablet; Refill: 4  3. Depression, unspecified depression type Pt agreeable to counseling and and starting an antidepressant. We discussed starting Zoloft.  Pt advised that it can take 4 wks  before he starts feeling better - sertraline (ZOLOFT) 50 MG tablet; 1/2 tab daily x 2 wks then 1 tab daily PO  Dispense: 30 tablet; Refill: 3  4. Unintended weight loss -likely due to uncontrolled DM.  Pt to sign release for me to get colonoscopy report from [facility]. Routine blood tests today - TSH  5. Gait disturbance I think likely due to peripheral neuropathy Will refer for some P.T once he has been approved for [facility] discount - Vitamin B12  6. Need for Streptococcus pneumoniae and influenza vaccination - Flu Vaccine QUAD 6+ mos PF IM (Fluarix Quad PF) Pneumovax and Tdap to be given at pharmacy.   7. Screening for HIV (human immunodeficiency virus) - HIV antibody  8. Need for hepatitis C screening test - Hepatitis C Antibody   Patient was given the opportunity to ask questions.  Patient verbalized understanding of the plan and was able to repeat key elements of the plan.   Orders Placed This Encounter  Procedures  . Flu Vaccine QUAD 6+ mos PF IM (Fluarix Quad PF)  . Microalbumin / creatinine urine ratio  . Comprehensive metabolic panel  . CBC  . Lipid panel  . HIV antibody  . Hepatitis C Antibody  . TSH  . Vitamin B12  . POCT glucose (manual entry)  . POCT glycosylated hemoglobin (Hb A1C)     Requested Prescriptions   Signed Prescriptions Disp Refills  . lisinopril (PRINIVIL,ZESTRIL) 5 MG tablet 30 tablet 6    Sig: Take 1 tablet (5 mg total) by mouth daily.  . Insulin Glargine (LANTUS SOLOSTAR) 100 UNIT/ML Solostar Pen 5 pen 3    Sig: Inject 30 Units into the skin daily at 10 pm.  . insulin aspart (NOVOLOG FLEXPEN) 100 UNIT/ML FlexPen 15 mL 11    Sig: 5 units twice a day with the two largest meals  . amLODipine (NORVASC) 5 MG tablet 90 tablet 3    Sig: Take 1 tablet (5 mg total) by mouth daily.  [name] gabapentin (NEURONTIN) 300 MG capsule 60 capsule 3    Sig: 1 cap PO QHS x 1 wk then BID  . sertraline (ZOLOFT) 50 MG tablet 30 tablet 3  Sig: 1/2 tab daily  x 2 wks then 1 tab daily PO  . Blood Glucose Monitoring Suppl (TRUE METRIX METER) w/Device KIT 1 kit 0    Sig: Use as directed  . glucose blood (TRUE METRIX BLOOD GLUCOSE TEST) test strip 100 each 12    Sig: Use as instructed  . TRUEPLUS LANCETS 28G MISC 100 each 1    Sig: Use as directed  . aspirin EC 81 MG tablet 100 tablet 4    Sig: Take 1 tablet (81 mg total) by mouth daily.  . Insulin Pen Needle (PEN NEEDLES) 31G X 8 MM MISC 100 each 6    Sig: Use as directed  . Tdap (BOOSTRIX) 5-2.5-18.5 LF-MCG/0.5 injection 0.5 mL 0    Sig: Inject 0.5 mLs into the muscle once for 1 dose.    Return in about 6 weeks (around 03/19/2017).  Karle Plumber, MD, FACP

## 2017-02-05 NOTE — Patient Instructions (Addendum)
Please give appointment with Stacy in 2 weeks for titration of insulin. Please have patient sign a release for [name] to get his medical records from [facility] and colonoscopy report from [facility].  Start Novolog insulin 5 units with the two largest meals of the day. Check blood sugars 2-3 times a day and bring in readings on next visit in 2 weeks and 6 weeks.   Start Gabapentin 300 mg at nights for 1 week then take twice a day.   Start Zoloft for depression.    Influenza Virus Vaccine injection (Fluarix) What is this medicine? INFLUENZA VIRUS VACCINE (in floo EN zuh VAHY ruhs vak SEEN) helps to reduce the risk of getting influenza also known as the flu. This medicine may be used for other purposes; ask your health care provider or pharmacist if you have questions. COMMON BRAND NAME(S): Fluarix, Fluzone What should I tell my health care provider before I take this medicine? They need to know if you have any of these conditions: -bleeding disorder like hemophilia -fever or infection -Guillain-Barre syndrome or other neurological problems -immune system problems -infection with the human immunodeficiency virus (HIV) or AIDS -low blood platelet counts -multiple sclerosis -an unusual or allergic reaction to influenza virus vaccine, eggs, chicken proteins, latex, gentamicin, other medicines, foods, dyes or preservatives -pregnant or trying to get pregnant -breast-feeding How should I use this medicine? This vaccine is for injection into a muscle. It is given by a health care professional. A copy of Vaccine Information Statements will be given before each vaccination. Read this sheet carefully each time. The sheet may change frequently. Talk to your pediatrician regarding the use of this medicine in children. Special care may be needed. Overdosage: If you think you have taken too much of this medicine contact a poison control center or emergency room at once. NOTE: This medicine is only  for you. Do not share this medicine with others. What if I miss a dose? This does not apply. What may interact with this medicine? -chemotherapy or radiation therapy -medicines that lower your immune system like etanercept, anakinra, infliximab, and adalimumab -medicines that treat or prevent blood clots like warfarin -phenytoin -steroid medicines like prednisone or cortisone -theophylline -vaccines This list may not describe all possible interactions. Give your health care provider a list of all the medicines, herbs, non-prescription drugs, or dietary supplements you use. Also tell them if you smoke, drink alcohol, or use illegal drugs. Some items may interact with your medicine. What should I watch for while using this medicine? Report any side effects that do not go away within 3 days to your doctor or health care professional. Call your health care provider if any unusual symptoms occur within 6 weeks of receiving this vaccine. You may still catch the flu, but the illness is not usually as bad. You cannot get the flu from the vaccine. The vaccine will not protect against colds or other illnesses that may cause fever. The vaccine is needed every year. What side effects may I notice from receiving this medicine? Side effects that you should report to your doctor or health care professional as soon as possible: -allergic reactions like skin rash, itching or hives, swelling of the face, lips, or tongue Side effects that usually do not require medical attention (report to your doctor or health care professional if they continue or are bothersome): -fever -headache -muscle aches and pains -pain, tenderness, redness, or swelling at site where injected -weak or tired This list may  not describe all possible side effects. Call your doctor for medical advice about side effects. You may report side effects to FDA at 1-800-FDA-1088. Where should I keep my medicine? This vaccine is only given in a  clinic, pharmacy, doctor's office, or other health care setting and will not be stored at home. NOTE: This sheet is a summary. It may not cover all possible information. If you have questions about this medicine, talk to your doctor, pharmacist, or health care provider.  2018 Elsevier/Gold Standard (2007-07-30 09:30:40)

## 2017-02-06 ENCOUNTER — Other Ambulatory Visit: Payer: Self-pay | Admitting: Pharmacist

## 2017-02-06 ENCOUNTER — Telehealth: Payer: Self-pay | Admitting: Internal Medicine

## 2017-02-06 LAB — CBC
HEMATOCRIT: 43.5 % (ref 37.5–51.0)
HEMOGLOBIN: 14.8 g/dL (ref 13.0–17.7)
MCH: 30.3 pg (ref 26.6–33.0)
MCHC: 34 g/dL (ref 31.5–35.7)
MCV: 89 fL (ref 79–97)
Platelets: 303 10*3/uL (ref 150–379)
RBC: 4.89 x10E6/uL (ref 4.14–5.80)
RDW: 14.2 % (ref 12.3–15.4)
WBC: 5.3 10*3/uL (ref 3.4–10.8)

## 2017-02-06 LAB — COMPREHENSIVE METABOLIC PANEL
ALK PHOS: 90 IU/L (ref 39–117)
ALT: 12 IU/L (ref 0–44)
AST: 19 IU/L (ref 0–40)
Albumin/Globulin Ratio: 1.4 (ref 1.2–2.2)
Albumin: 4.1 g/dL (ref 3.5–5.5)
BUN/Creatinine Ratio: 8 — ABNORMAL LOW (ref 9–20)
BUN: 9 mg/dL (ref 6–24)
Bilirubin Total: 0.6 mg/dL (ref 0.0–1.2)
CALCIUM: 9.4 mg/dL (ref 8.7–10.2)
CO2: 26 mmol/L (ref 20–29)
CREATININE: 1.07 mg/dL (ref 0.76–1.27)
Chloride: 98 mmol/L (ref 96–106)
GFR calc Af Amer: 89 mL/min/{1.73_m2} (ref >59)
GFR calc non Af Amer: 77 mL/min/{1.73_m2} (ref >59)
GLUCOSE: 171 mg/dL — AB (ref 65–99)
Globulin, Total: 2.9 g/dL (ref 1.5–4.5)
Potassium: 3.6 mmol/L (ref 3.5–5.2)
Sodium: 139 mmol/L (ref 134–144)
Total Protein: 7 g/dL (ref 6.0–8.5)

## 2017-02-06 LAB — HIV ANTIBODY (ROUTINE TESTING W REFLEX): HIV SCREEN 4TH GENERATION: NONREACTIVE

## 2017-02-06 LAB — LIPID PANEL
Chol/HDL Ratio: 3.8 ratio (ref 0.0–5.0)
Cholesterol, Total: 184 mg/dL (ref 100–199)
HDL: 49 mg/dL (ref >39)
LDL CALC: 118 mg/dL — AB (ref 0–99)
Triglycerides: 85 mg/dL (ref 0–149)
VLDL CHOLESTEROL CAL: 17 mg/dL (ref 5–40)

## 2017-02-06 LAB — MICROALBUMIN / CREATININE URINE RATIO
CREATININE, UR: 112.6 mg/dL
MICROALBUM., U, RANDOM: 1464.1 ug/mL
Microalb/Creat Ratio: 1300.3 mg/g creat — ABNORMAL HIGH (ref 0.0–30.0)

## 2017-02-06 LAB — HEPATITIS C ANTIBODY: Hep C Virus Ab: <0.1 s/co ratio (ref 0.0–0.9)

## 2017-02-06 LAB — VITAMIN B12: VITAMIN B 12: 292 pg/mL (ref 232–1245)

## 2017-02-06 LAB — TSH: TSH: 1.89 u[IU]/mL (ref 0.450–4.500)

## 2017-02-06 MED ORDER — ATORVASTATIN CALCIUM 10 MG PO TABS
10.0000 mg | ORAL_TABLET | Freq: Every day | ORAL | 3 refills | Status: DC
Start: 2017-02-06 — End: 2018-06-03

## 2017-02-06 MED ORDER — VITAMIN B-12 1000 MCG PO TABS: 1000.0000 ug | ORAL_TABLET | Freq: Every day | ORAL | 3 refills | Status: DC

## 2017-02-06 MED ORDER — PNEUMOCOCCAL VAC POLYVALENT 25 MCG/0.5ML IJ INJ: INJECTION | INTRAMUSCULAR | 0 refills | Status: DC

## 2017-02-06 NOTE — Telephone Encounter (Signed)
PC placed to pt this evening.  Pt informed that his Vit B 12 level is in the low normal range.  This can contribute to peripheral neuropathy symptoms and feeling off balance.  I recommend replacement.   He has 1.3 gram protein in the urine.  This can indicate damage to the kidney from uncontrolled BP and DM.  Pt started on Lisinopril yesterday.  Will plan to recheck UA in 3 mths.  I stress the importance of good BP and BS control. LDL cholesterol 118.  I recommend starting Lipitor to help dec CV risk. Rxn sent to [facility] pharmacy for Lipitor and B12.  Pt will pick up this week. Results for orders placed or performed in visit on 02/05/17  Microalbumin / creatinine urine ratio  Result Value Ref Range   Creatinine, Urine 112.6 Not Estab. mg/dL   Albumin, Urine 1,464.1 Not Estab. ug/mL   Microalb/Creat Ratio 1,300.3 (H) 0.0 - 30.0 mg/g creat  Comprehensive metabolic panel  Result Value Ref Range   Glucose 171 (H) 65 - 99 mg/dL   BUN 9 6 - 24 mg/dL   Creatinine, Ser 1.07 0.76 - 1.27 mg/dL   GFR calc non Af Amer 77 >59 mL/min/1.73   GFR calc Af Amer 89 >59 mL/min/1.73   BUN/Creatinine Ratio 8 (L) 9 - 20   Sodium 139 134 - 144 mmol/L   Potassium 3.6 3.5 - 5.2 mmol/L   Chloride 98 96 - 106 mmol/L   CO2 26 20 - 29 mmol/L   Calcium 9.4 8.7 - 10.2 mg/dL   Total Protein 7.0 6.0 - 8.5 g/dL   Albumin 4.1 3.5 - 5.5 g/dL   Globulin, Total 2.9 1.5 - 4.5 g/dL   Albumin/Globulin Ratio 1.4 1.2 - 2.2   Bilirubin Total 0.6 0.0 - 1.2 mg/dL   Alkaline Phosphatase 90 39 - 117 IU/L   AST 19 0 - 40 IU/L   ALT 12 0 - 44 IU/L  CBC  Result Value Ref Range   WBC 5.3 3.4 - 10.8 x10E3/uL   RBC 4.89 4.14 - 5.80 x10E6/uL   Hemoglobin 14.8 13.0 - 17.7 g/dL   Hematocrit 43.5 37.5 - 51.0 %   MCV 89 79 - 97 fL   MCH 30.3 26.6 - 33.0 pg   MCHC 34.0 31.5 - 35.7 g/dL   RDW 14.2 12.3 - 15.4 %   Platelets 303 150 - 379 x10E3/uL  Lipid panel  Result Value Ref Range   Cholesterol, Total 184 100 - 199 mg/dL   Triglycerides 85 0 - 149 mg/dL   HDL 49 >39 mg/dL   VLDL Cholesterol Cal 17 5 - 40 mg/dL   LDL Calculated 118 (H) 0 - 99 mg/dL   Chol/HDL Ratio 3.8 0.0 - 5.0 ratio  HIV antibody  Result Value Ref Range   HIV Screen 4th Generation wRfx Non Reactive Non Reactive  Hepatitis C Antibody  Result Value Ref Range   Hep C Virus Ab <0.1 0.0 - 0.9 s/co ratio  TSH  Result Value Ref Range   TSH 1.890 0.450 - 4.500 uIU/mL  Vitamin B12  Result Value Ref Range   Vitamin B-12 292 232 - 1,245 pg/mL  POCT glucose (manual entry)  Result Value Ref Range   POC Glucose 156 (A) 70 - 99 mg/dl  POCT glycosylated hemoglobin (Hb A1C)  Result Value Ref Range   Hemoglobin A1C 11.2

## 2017-02-06 NOTE — BH Specialist Note (Signed)
Integrated Behavioral Health Initial Visit  MRN: [id number] Name: RUBENS CRANSTON  Number of [place] Clinician visits:: 1/6 Session Start time: 10:35 AM  Session End time: 11:05 AM Total time: 30 minutes  Type of Service: [place] Interpretor:No. Interpretor Name and Language: NA   Warm Hand Off Completed.       SUBJECTIVE: ROCIO WOLAK is a 57 y.o. male accompanied by self Patient was referred by Dr. Wynetta Emery for depression and anxiety. Patient reports the following symptoms/concerns: chronic pain, difficulty sleeping, racing thoughts, low energy, decreased concentration, and irritability Duration of problem: Over a month; Severity of problem: moderate  OBJECTIVE: Mood: Pleasant and Affect: Depressed Risk of harm to self or others: No plan to harm self or others  LIFE CONTEXT: Family and Social: Pt receives support from family and friends. He is currently in the process of divorcing spouse of over twenty years. He has three adult children School/Work: Pt is unemployed. He was denied disability Self-Care: Pt is open to medication management. No report of substance use Life Changes: Pt experiences chronic pain. He is getting a divorce after being married for over twenty years  GOALS ADDRESSED: Patient will: 1. Reduce symptoms of: anxiety and depression 2. Increase knowledge and/or ability of: coping skills  3. Demonstrate ability to: Increase adequate support systems for patient/family  INTERVENTIONS: Interventions utilized: Solution-Focused Strategies, Supportive Counseling, Psychoeducation and/or Health Education and Link to [employer]  Standardized Assessments completed: GAD-7 and PHQ 2&9  ASSESSMENT: Patient currently experiencing depression and anxiety triggered by chronic pain. He is in the process of getting a divorce from spouse of over twenty years. Pt reports difficulty sleeping, racing  thoughts, low energy, decreased concentration, and irritability. He receives support from family and friends.   Patient may benefit from psychoeducation, psychotherapy, and medication management. LCSWA educated pt on the correlation between one's physical and mental health, in addition, to how stress can negatively impact health. LCSWA discussed therapeutic interventions to decrease symptoms. Pt agreed to participate in medication management through PCP and was provided resources for psychotherapy. He has met with Financial Counsel[facility] to assist with orange card to meet with specialists for chronic pain.   PLAN: 1. Follow up with behavioral health clinician on : Pt was encouraged to contact LCSWA if symptoms worsen or fail to improve to schedule behavioral appointments at [facility]. 2. Behavioral recommendations: LCSWA recommends that pt apply healthy coping skills discussed, comply with medication management, and utilize provided resources. Pt is encouraged to schedule follow up appointment with LCSWA 3. Referral(s): [place] (In Clinic) and [place] (LME/Outside Clinic) 4. "From scale of 1-10, how likely are you to follow plan?": 8/10  Rebekah Chesterfield, LCSW 02/07/17 5:05 PM

## 2017-02-07 MED FILL — ?ATORVASTATIN 10 MG TABLET: 10 | 30 days supply | Qty: 30 | Fill #0

## 2017-03-19 ENCOUNTER — Ambulatory Visit: Payer: Self-pay | Admitting: Internal Medicine

## 2017-03-21 ENCOUNTER — Ambulatory Visit: Payer: Self-pay | Attending: Internal Medicine | Admitting: Internal Medicine

## 2017-03-21 ENCOUNTER — Encounter: Payer: Self-pay | Admitting: Internal Medicine

## 2017-03-21 VITALS — BP 161/106 | HR 98 | Temp 98.0°F | Resp 16 | Wt 208.0 lb

## 2017-03-21 DIAGNOSIS — Z7982 Long term (current) use of aspirin: Secondary | ICD-10-CM | POA: Insufficient documentation

## 2017-03-21 DIAGNOSIS — Z79899 Other long term (current) drug therapy: Secondary | ICD-10-CM | POA: Insufficient documentation

## 2017-03-21 DIAGNOSIS — G8929 Other chronic pain: Secondary | ICD-10-CM

## 2017-03-21 DIAGNOSIS — E1142 Type 2 diabetes mellitus with diabetic polyneuropathy: Secondary | ICD-10-CM

## 2017-03-21 DIAGNOSIS — S80211A Abrasion, right knee, initial encounter: Secondary | ICD-10-CM

## 2017-03-21 DIAGNOSIS — M545 Low back pain, unspecified: Secondary | ICD-10-CM

## 2017-03-21 DIAGNOSIS — E538 Deficiency of other specified B group vitamins: Secondary | ICD-10-CM

## 2017-03-21 DIAGNOSIS — R809 Proteinuria, unspecified: Secondary | ICD-10-CM

## 2017-03-21 DIAGNOSIS — Z794 Long term (current) use of insulin: Secondary | ICD-10-CM | POA: Insufficient documentation

## 2017-03-21 DIAGNOSIS — F329 Major depressive disorder, single episode, unspecified: Secondary | ICD-10-CM | POA: Insufficient documentation

## 2017-03-21 DIAGNOSIS — I10 Essential (primary) hypertension: Secondary | ICD-10-CM

## 2017-03-21 DIAGNOSIS — W19XXXA Unspecified fall, initial encounter: Secondary | ICD-10-CM

## 2017-03-21 DIAGNOSIS — R269 Unspecified abnormalities of gait and mobility: Secondary | ICD-10-CM

## 2017-03-21 DIAGNOSIS — E785 Hyperlipidemia, unspecified: Secondary | ICD-10-CM

## 2017-03-21 DIAGNOSIS — E1165 Type 2 diabetes mellitus with hyperglycemia: Secondary | ICD-10-CM | POA: Insufficient documentation

## 2017-03-21 DIAGNOSIS — F32A Depression, unspecified: Secondary | ICD-10-CM

## 2017-03-21 LAB — GLUCOSE, POCT (MANUAL RESULT ENTRY): POC Glucose: 86 mg/dl (ref 70–99)

## 2017-03-21 MED ORDER — GABAPENTIN 300 MG PO CAPS: 600.0000 mg | ORAL_CAPSULE | Freq: Three times a day (TID) | ORAL | 6 refills | Status: DC

## 2017-03-21 MED ORDER — SERTRALINE HCL 50 MG PO TABS: 50.0000 mg | ORAL_TABLET | Freq: Every day | ORAL | 6 refills | Status: DC

## 2017-03-21 MED ORDER — MUPIROCIN 2 % EX OINT: 1.0000 "application " | TOPICAL_OINTMENT | Freq: Two times a day (BID) | CUTANEOUS | 0 refills | Status: DC

## 2017-03-21 MED ORDER — LISINOPRIL 10 MG PO TABS: 10.0000 mg | ORAL_TABLET | Freq: Every day | ORAL | 4 refills | Status: DC

## 2017-03-21 MED FILL — GABAPENTIN 300 MG CAPSULE: 300 | 30 days supply | Qty: 60 | Fill #1

## 2017-03-21 MED FILL — MUPIROCIN 2% OINTMENT: 2 | 5 days supply | Qty: 22 | Fill #0

## 2017-03-21 MED FILL — TRUE METRIX TEST STRIP: 30 days supply | Qty: 100 | Fill #1

## 2017-03-21 MED FILL — !HUMALOG 100 UNITS/ML KWIKP: 100 | 28 days supply | Qty: 3 | Fill #1

## 2017-03-21 MED FILL — ?ATORVASTATIN 10 MG TABLET: 10 | 30 days supply | Qty: 30 | Fill #1

## 2017-03-21 MED FILL — LISINOPRIL 5 MG TAB: 5 | 30 days supply | Qty: 30 | Fill #1

## 2017-03-21 MED FILL — TRUEplus LANCETS 28G MISC: 30 days supply | Qty: 100 | Fill #1

## 2017-03-21 MED FILL — ?AMLODIPINE BESYLATE 5 MG T: 5 MG | 30 days supply | Qty: 30 | Fill #1

## 2017-03-21 MED FILL — LISINOPRIL 10 MG TABS: 10 | 30 days supply | Qty: 30 | Fill #0

## 2017-03-21 MED FILL — SERTRALINE HCL 50 MG TABLET: 50 | 30 days supply | Qty: 30 | Fill #1

## 2017-03-21 MED FILL — SERTRALINE HCL 50 MG TABLET: 50 | 28 days supply | Qty: 21 | Fill #0

## 2017-03-21 MED FILL — !LANTUS SOLOSTAR 100UNITS/M: 100 | 30 days supply | Qty: 9 | Fill #1

## 2017-03-21 NOTE — Patient Instructions (Addendum)
Please receive the pneumonia and tdap from the pharmacy  Please give appointment with Stacy in 2 weeks for recheck on blood pressure.  Stop Metformin. Continue Lantus at 30 units daily.  If your morning blood sugars run less than 80, then decrease Lantus to 27 units.  Increase lisinopril to 10 mg daily for blood pressure and because of the protein in your urine.  Increase gabapentin to 600 mg 3 times a day.  You have been referred for for physical therapy.  I recommend also getting an eye exam done.  I have placed an order for you to have an x-ray done on your back.  You can go to [facility] radiology department at any time to have this done.

## 2017-03-21 NOTE — Progress Notes (Signed)
Patient ID: Ryan Jenkins, male    DOB: 1960-04-03  MRN: [id number]  CC: Diabetes   Subjective: Ryan Jenkins is a 57 y.o. male who presents for 6 weeks follow-up visit. His concerns today include:   1.  Diabetes: On last visit metformin and Lantus were continued.  NovoLog 5 units was added to the 2 largest meals of the day. -reports significant diarrhea with Metformin but still taking -compliant with Lantus and Novolog -has cut back on sweets.  Lives with aunt who does all the cooking -checking BS BID. A.m 67-84, before dinner 102-160.  BS less than 80 in a.m about 3 x a wk Patient found to have macroalbuminuria with 1.3 g of protein in the urine.  He was started on lisinopril on last visit. -LDL cholesterol was 110.  Started on Lipitor. Tolerating okay  2. Found to have low normal B12 level of 292.  B12 supplement advised. Taking Vit B12 1000 mcg daily.  2.  HTN:  No device to check.  Did not take meds as yet for the a.m -limits salt No CP/SOB  3.  Depression:  Started on Zoloft.  Feels he is doing better on current dose.   4. Balance issues: had a fall 4 days ago outside the airport in [place].  Went to step up on the curb and RT foot missed it.  Fell on his knees and bruised the right knee.  Currently wearing a Band-Aid over the abrasion -still has shooting pains in legs -numbness and shooting pains BL hands involvng 3rd-5th fingers and radiates to elbows -Has chronic back pain issues.  Reports being told 2 yrs ago by ortho that he has protruding disc in lower back. Recommended PT but pt was not able to afford.  Placed on Naprosyn. Also saw a chiropractor for neck pain and was told he has bad disc in neck -he self increased Gabapentin to TID because 300 twice a day was not controlling the neuropathy symptoms in his upper and lower extremities.   Patient Active Problem List   Diagnosis Date Noted  . Hyperlipidemia 03/21/2017  . Vitamin B 12 deficiency 03/21/2017  .  Uncontrolled type 2 diabetes mellitus with peripheral neuropathy ([place]) 02/05/2017  . Essential hypertension 02/05/2017  . Depression 02/05/2017  . Unintended weight loss 02/05/2017  . Gait disturbance 02/05/2017  . 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 Diabetic neuropathy, type II diabetes mellitus ([place]) 05/11/2014  . Metatarsal deformity 05/11/2014     Current Outpatient Medications on File Prior to Visit  Medication Sig Dispense Refill  . amLODipine (NORVASC) 5 MG tablet Take 1 tablet (5 mg total) by mouth daily. 90 tablet 3  . aspirin EC 81 MG tablet Take 1 tablet (81 mg total) by mouth daily. 100 tablet 4  . atorvastatin (LIPITOR) 10 MG tablet Take 1 tablet (10 mg total) by mouth daily. 90 tablet 3  . Blood Glucose Monitoring Suppl (TRUE METRIX METER) w/Device KIT Use as directed 1 kit 0  . glucose blood (TRUE METRIX BLOOD GLUCOSE TEST) test strip Use as instructed 100 each 12  . insulin aspart (NOVOLOG FLEXPEN) 100 UNIT/ML FlexPen 5 units twice a day with the two largest meals 15 mL 11  . Insulin Glargine (LANTUS SOLOSTAR) 100 UNIT/ML Solostar Pen Inject 30 Units into the skin daily at 10 pm. 5 pen 3  . Insulin Pen Needle (PEN NEEDLES) 31G X 8 MM MISC Use as directed 100 each 6  . pneumococcal 23  valent vaccine (PNEUMOVAX 23) 25 MCG/0.5ML injection TO BE ADMINISTERED BY THE PHARMACIST 0.5 mL 0  . TRUEPLUS LANCETS 28G MISC Use as directed 100 each 1  . vitamin B-12 (CYANOCOBALAMIN) 1000 MCG tablet Take 1 tablet (1,000 mcg total) by mouth daily. 100 tablet 3   No current facility-administered medications on file prior to visit.     Allergies  Allergen Reactions  . Claritin [Loratadine] Swelling    Joint swelling   . Lyrica [Pregabalin]     depression    Social History   Socioeconomic History  . Marital status: Single    Spouse name: Not on file  . Number of children: Not on file  . Years of education: 38  . Highest education level: Not on file  Social Needs  .  Financial resource strain: Not on file  . Food insecurity - worry: Not on file  . Food insecurity - inability: Not on file  . Transportation needs - medical: Not on file  . Transportation needs - non-medical: Not on file  Occupational History  . 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 Smoking status: Never Smoker  . Smokeless tobacco: Never Used  Substance and Sexual Activity  . Alcohol use: No    Alcohol/week: 0.0 oz  . Drug use: No  . Sexual activity: Yes  Other Topics Concern  . Not on file  Social History Narrative  . Not on file    Family History  Problem Relation Age of Onset  . Renal cancer Mother   . Hypertension Mother   . Hypertension Sister   . Leukemia Maternal Uncle     Past Surgical History:  Procedure Laterality Date  . CATARACT EXTRACTION Bilateral 2017    ROS: Review of Systems Negative except as stated above PHYSICAL EXAM: BP (!) 161/106   Pulse 98   Temp 98 F (36.7 C) (Oral)   Resp 16   Wt 208 lb (94.3 kg)   SpO2 100%   BMI 26.71 kg/m   Wt Readings from Last 3 Encounters:  03/21/17 208 lb (94.3 kg)  02/05/17 203 lb (92.1 kg)  05/11/14 226 lb (102.5 kg)  BP: 160/100  Physical Exam  General appearance - alert, well appearing, and in no distress Mental status - alert, oriented to person, place, and time, normal mood, behavior, speech, dress, motor activity, and thought processes Chest - clear to auscultation, no wheezes, rales or rhonchi, symmetric air entry Heart - normal rate, regular rhythm, normal S1, S2, no murmurs, rubs, clicks or gallops Musculoskeletal -right knee: Band-Aid removed.  2 areas of abrasion inferior to the patella.  Largest one is about 3 cm in size with some drainage noted on the Band-Aid dressing. Extremities -no lower extremity edema  BS 86 Lab Results  Component Value Date   HGBA1C 11.2 02/05/2017    ASSESSMENT AND PLAN: 1. Type 2 diabetes mellitus with diabetic  polyneuropathy, with long-term current use of insulin (HCC) -Discontinue metformin due to diarrhea. Morning blood sugars running low; hopefully this will improve now that we have stopped 1 of his diabetes medications.  Patient advised that if morning blood sugars continue to run less than 80, he should decrease Lantus from 30 units to 27 units. - POCT glucose (manual entry) - Creatinine, serum - gabapentin (NEURONTIN) 300 MG capsule; Take 2 capsules (600 mg total) by mouth 3 (three) times daily. 1 cap PO QHS x 1 wk then BID  Dispense: 180  capsule; Refill: 6  2. Essential hypertension Not at goal.  He has not taken his medicines as yet for the morning.  However I will increase the lisinopril to 10 mg given the degree of protein in his urine.  Recheck urine microalbumin urea on next visit - lisinopril (PRINIVIL,ZESTRIL) 10 MG tablet; Take 1 tablet (10 mg total) by mouth daily.  Dispense: 90 tablet; Refill: 4  3. Hyperlipidemia, unspecified hyperlipidemia type Tolerating Lipitor  4. Vitamin B 12 deficiency Recheck B12 level today to see if it has improve with him taking 1000 mcg daily - Vitamin B12  5. Chronic midline low back pain without sciatica - DG Lumbar Spine Complete; Future - Ambulatory referral to Physical Therapy  6. Depression, unspecified depression type Doing better on Zoloft.  Continue current dose - sertraline (ZOLOFT) 50 MG tablet; Take 1 tablet (50 mg total) by mouth daily. 1/2 tab daily x 2 wks then 1 tab daily PO  Dispense: 30 tablet; Refill: 6  7. Gait disturbance - Ambulatory referral to Physical Therapy  8. Fall, initial encounter - Ambulatory referral to Physical Therapy  9. Abrasion, knee, right, initial encounter - mupirocin ointment (BACTROBAN) 2 %; Place 1 application into the nose 2 (two) times daily.  Dispense: 22 g; Refill: 0  10. Positive for macroalbuminuria See plan above to increase lisinopril. - Creatinine, serum   Patient was given the  opportunity to ask questions.  Patient verbalized understanding of the plan and was able to repeat key elements of the plan.   Orders Placed This Encounter  Procedures  . DG Lumbar Spine Complete  . Vitamin B12  . Creatinine, serum  . Ambulatory referral to Physical Therapy  . POCT glucose (manual entry)     Requested Prescriptions   Signed Prescriptions Disp Refills  . mupirocin ointment (BACTROBAN) 2 % 22 g 0    Sig: Place 1 application into the nose 2 (two) times daily.  . sertraline (ZOLOFT) 50 MG tablet 30 tablet 6    Sig: Take 1 tablet (50 mg total) by mouth daily. 1/2 tab daily x 2 wks then 1 tab daily PO  . lisinopril (PRINIVIL,ZESTRIL) 10 MG tablet 90 tablet 4    Sig: Take 1 tablet (10 mg total) by mouth daily.  [name] gabapentin (NEURONTIN) 300 MG capsule 180 capsule 6    Sig: Take 2 capsules (600 mg total) by mouth 3 (three) times daily. 1 cap PO QHS x 1 wk then BID    Return in about 2 months (around 05/21/2017).  Karle Plumber, MD, FACP

## 2017-03-22 ENCOUNTER — Telehealth: Payer: Self-pay

## 2017-03-22 LAB — VITAMIN B12: Vitamin B-12: 1026 pg/mL (ref 232–1245)

## 2017-03-22 LAB — CREATININE, SERUM
CREATININE: 0.82 mg/dL (ref 0.76–1.27)
GFR calc non Af Amer: 99 mL/min/{1.73_m2} (ref >59)
GFR, EST AFRICAN AMERICAN: 114 mL/min/{1.73_m2} (ref >59)

## 2017-03-22 NOTE — Telephone Encounter (Signed)
Contacted pt to go over lab results pt didn't answer left a detailed vm informing pt of results and if he has any questions or concerns to give me a call  If pt calls back please give results: B12 level has normalized. Continue taking the vitamin B12 supplement.

## 2017-03-27 MED FILL — $PNEUMOVAX 23 VIAL: 25 | 1 days supply | Qty: 1 | Fill #0

## 2017-03-27 MED FILL — $BOOSTRIX VACCINE SYRINGE: 5-2.5-18.5 | 1 days supply | Qty: 1 | Fill #0

## 2017-04-01 ENCOUNTER — Ambulatory Visit: Payer: Self-pay | Attending: Internal Medicine | Admitting: Physical Therapy

## 2017-04-04 ENCOUNTER — Encounter: Payer: Self-pay | Admitting: Pharmacist

## 2017-04-04 ENCOUNTER — Ambulatory Visit: Payer: Self-pay | Attending: Internal Medicine | Admitting: Pharmacist

## 2017-04-04 DIAGNOSIS — Z79899 Other long term (current) drug therapy: Secondary | ICD-10-CM | POA: Insufficient documentation

## 2017-04-04 DIAGNOSIS — I10 Essential (primary) hypertension: Secondary | ICD-10-CM

## 2017-04-04 MED ORDER — LISINOPRIL 20 MG PO TABS: 20.0000 mg | ORAL_TABLET | Freq: Every day | ORAL | 2 refills | Status: DC

## 2017-04-04 MED ORDER — LISINOPRIL 10 MG PO TABS: 10.0000 mg | ORAL_TABLET | Freq: Every day | ORAL | 0 refills | Status: DC

## 2017-04-04 NOTE — Patient Instructions (Addendum)
Thanks for coming to see [name]  Blood pressure looks good, follow up with Dr. Wynetta Emery as directed.     DASH Eating Plan DASH stands for "Dietary Approaches to Stop Hypertension." The DASH eating plan is a healthy eating plan that has been shown to reduce high blood pressure (hypertension). It may also reduce your risk for type 2 diabetes, heart disease, and stroke. The DASH eating plan may also help with weight loss. What are tips for following this plan? General guidelines  Avoid eating more than 2,300 mg (milligrams) of salt (sodium) a day. If you have hypertension, you may need to reduce your sodium intake to 1,500 mg a day.  Limit alcohol intake to no more than 1 drink a day for nonpregnant women and 2 drinks a day for men. One drink equals 12 oz of beer, 5 oz of wine, or 1 oz of hard liquor.  Work with your health care provider to maintain a healthy body weight or to lose weight. Ask what an ideal weight is for you.  Get at least 30 minutes of exercise that causes your heart to beat faster (aerobic exercise) most days of the week. Activities may include walking, swimming, or biking.  Work with your health care provider or diet and nutrition specialist (dietitian) to adjust your eating plan to your individual calorie needs. Reading food labels  Check food labels for the amount of sodium per serving. Choose foods with less than 5 percent of the Daily Value of sodium. Generally, foods with less than 300 mg of sodium per serving fit into this eating plan.  To find whole grains, look for the word "whole" as the first word in the ingredient list. Shopping  Buy products labeled as "low-sodium" or "no salt added."  Buy fresh foods. Avoid canned foods and premade or frozen meals. Cooking  Avoid adding salt when cooking. Use salt-free seasonings or herbs instead of table salt or sea salt. Check with your health care provider or pharmacist before using salt substitutes.  Do not fry foods.  Cook foods using healthy methods such as baking, boiling, grilling, and broiling instead.  Cook with heart-healthy oils, such as olive, canola, soybean, or sunflower oil. Meal planning   Eat a balanced diet that includes: ? 5 or more servings of fruits and vegetables each day. At each meal, try to fill half of your plate with fruits and vegetables. ? Up to 6-8 servings of whole grains each day. ? Less than 6 oz of lean meat, poultry, or fish each day. A 3-oz serving of meat is about the same size as a deck of cards. One egg equals 1 oz. ? 2 servings of low-fat dairy each day. ? A serving of nuts, seeds, or beans 5 times each week. ? Heart-healthy fats. Healthy fats called Omega-3 fatty acids are found in foods such as flaxseeds and coldwater fish, like sardines, salmon, and mackerel.  Limit how much you eat of the following: ? Canned or prepackaged foods. ? Food that is high in trans fat, such as fried foods. ? Food that is high in saturated fat, such as fatty meat. ? Sweets, desserts, sugary drinks, and other foods with added sugar. ? Full-fat dairy products.  Do not salt foods before eating.  Try to eat at least 2 vegetarian meals each week.  Eat more home-cooked food and less restaurant, buffet, and fast food.  When eating at a restaurant, ask that your food be prepared with less salt or no  salt, if possible. What foods are recommended? The items listed may not be a complete list. Talk with your dietitian about what dietary choices are best for you. Grains Whole-grain or whole-wheat bread. Whole-grain or whole-wheat pasta. Brown rice. [place]. Bulgur. Whole-grain and low-sodium cereals. Pita bread. Low-fat, low-sodium crackers. Whole-wheat flour tortillas. Vegetables Fresh or frozen vegetables (raw, steamed, roasted, or grilled). Low-sodium or reduced-sodium tomato and vegetable juice. Low-sodium or reduced-sodium tomato sauce and tomato paste. Low-sodium or reduced-sodium  canned vegetables. Fruits All fresh, dried, or frozen fruit. Canned fruit in natural juice (without added sugar). Meat and other protein foods Skinless chicken or [place]. Ground chicken or [place]. Pork with fat trimmed off. Fish and seafood. Egg whites. Dried beans, peas, or lentils. Unsalted nuts, nut butters, and seeds. Unsalted canned beans. Lean cuts of beef with fat trimmed off. Low-sodium, lean deli meat. Dairy Low-fat (1%) or fat-free (skim) milk. Fat-free, low-fat, or reduced-fat cheeses. Nonfat, low-sodium ricotta or cottage cheese. Low-fat or nonfat yogurt. Low-fat, low-sodium cheese. Fats and oils Soft margarine without trans fats. Vegetable oil. Low-fat, reduced-fat, or light mayonnaise and salad dressings (reduced-sodium). Canola, safflower, olive, soybean, and sunflower oils. Avocado. Seasoning and other foods Herbs. Spices. Seasoning mixes without salt. Unsalted popcorn and pretzels. Fat-free sweets. What foods are not recommended? The items listed may not be a complete list. Talk with your dietitian about what dietary choices are best for you. Grains Baked goods made with fat, such as croissants, muffins, or some breads. Dry pasta or rice meal packs. Vegetables Creamed or fried vegetables. Vegetables in a cheese sauce. Regular canned vegetables (not low-sodium or reduced-sodium). Regular canned tomato sauce and paste (not low-sodium or reduced-sodium). Regular tomato and vegetable juice (not low-sodium or reduced-sodium). Angie Fava. Olives. Fruits Canned fruit in a light or heavy syrup. Fried fruit. Fruit in cream or butter sauce. Meat and other protein foods Fatty cuts of meat. Ribs. Fried meat. Berniece Salines. Sausage. Bologna and other processed lunch meats. Salami. Fatback. Hotdogs. Bratwurst. Salted nuts and seeds. Canned beans with added salt. Canned or smoked fish. Whole eggs or egg yolks. Chicken or [place] with skin. Dairy Whole or 2% milk, cream, and half-and-half. Whole or  full-fat cream cheese. Whole-fat or sweetened yogurt. Full-fat cheese. Nondairy creamers. Whipped toppings. Processed cheese and cheese spreads. Fats and oils Butter. Stick margarine. Lard. Shortening. Ghee. Bacon fat. Tropical oils, such as coconut, palm kernel, or palm oil. Seasoning and other foods Salted popcorn and pretzels. Onion salt, garlic salt, seasoned salt, table salt, and sea salt. Worcestershire sauce. Tartar sauce. Barbecue sauce. Teriyaki sauce. Soy sauce, including reduced-sodium. Steak sauce. Canned and packaged gravies. Fish sauce. Oyster sauce. Cocktail sauce. Horseradish that you find on the shelf. Ketchup. Mustard. Meat flavorings and tenderizers. Bouillon cubes. Hot sauce and Tabasco sauce. Premade or packaged marinades. Premade or packaged taco seasonings. Relishes. Regular salad dressings. Where to find more information:  National Heart, Lung, and [place]: https://wilson-eaton.com/  American Heart Association: www.heart.org Summary  The DASH eating plan is a healthy eating plan that has been shown to reduce high blood pressure (hypertension). It may also reduce your risk for type 2 diabetes, heart disease, and stroke.  With the DASH eating plan, you should limit salt (sodium) intake to 2,300 mg a day. If you have hypertension, you may need to reduce your sodium intake to 1,500 mg a day.  When on the DASH eating plan, aim to eat more fresh fruits and vegetables, whole grains, lean proteins, low-fat dairy, and heart-healthy  fats.  Work with your health care provider or diet and nutrition specialist (dietitian) to adjust your eating plan to your individual calorie needs. This information is not intended to replace advice given to you by your health care provider. Make sure you discuss any questions you have with your health care provider. Document Released: 12/21/2010 Document Revised: 12/26/2015 Document Reviewed: 12/26/2015 Elsevier Interactive Patient Education  [employer].

## 2017-04-04 NOTE — Progress Notes (Signed)
   S:    Patient arrives in good spirits.  Presents to the clinic for hypertension evaluation.   Patient reports adherence with medications. He denies any adverse effects.  Current BP Medications include:  Amlodipine 5 mg daily, lisinopril 10 mg daily    O:   Last 3 Office BP readings: BP Readings from Last 3 Encounters:  04/04/17 125/82  03/21/17 (!) 161/106  02/05/17 (!) 175/98    BMET    Component Value Date/Time   NA 139 02/05/2017 1042   K 3.6 02/05/2017 1042   CL 98 02/05/2017 1042   CO2 26 02/05/2017 1042   GLUCOSE 171 (H) 02/05/2017 1042   GLUCOSE 359 (H) 01/12/2016 2057   BUN 9 02/05/2017 1042   CREATININE 0.82 03/21/2017 1014   CALCIUM 9.4 02/05/2017 1042   GFRNONAA 99 03/21/2017 1014   GFRAA 114 03/21/2017 1014    A/P: Hypertension longstanding currently controlled on current medications. Patient tolerating both BP meds well.  Continued current medications.   Results reviewed and written information provided.   Total time in face-to-face counseling 5 minutes.   F/U Clinic Visit with Dr. Wynetta Emery as scheduled.  Patient seen with Suzzanne Cloud, PharmD Candidate

## 2017-04-29 MED FILL — TRUE METRIX TEST STRIP: 30 days supply | Qty: 100 | Fill #2

## 2017-04-29 MED FILL — !LANTUS SOLOSTAR 100UNITS/M: 100 | 30 days supply | Qty: 9 | Fill #2

## 2017-04-29 MED FILL — LISINOPRIL 10 MG TABS: 10 | 30 days supply | Qty: 30 | Fill #1

## 2017-04-29 MED FILL — ?ATORVASTATIN 10MG TABLET: 10 | 30 days supply | Qty: 30 | Fill #2

## 2017-04-29 MED FILL — ?AMLODIPINE BESYLATE 5 MG T: 5 MG | 30 days supply | Qty: 30 | Fill #2

## 2017-04-29 MED FILL — GABAPENTIN 300 MG CAPSULE: 300 | 30 days supply | Qty: 60 | Fill #2

## 2017-04-29 MED FILL — SERTRALINE HCL 50 MG TABS: 50 | 30 days supply | Qty: 30 | Fill #1

## 2017-04-30 MED FILL — !HUMALOG 100 UNITS/ML KWIKP: 100 | 28 days supply | Qty: 3 | Fill #2

## 2017-05-01 ENCOUNTER — Other Ambulatory Visit: Payer: Self-pay | Admitting: Internal Medicine

## 2017-05-01 MED ORDER — INSULIN LISPRO 100 UNIT/ML (KWIKPEN): PEN_INJECTOR | SUBCUTANEOUS | 11 refills | Status: DC

## 2017-05-23 ENCOUNTER — Ambulatory Visit: Payer: Self-pay | Admitting: Internal Medicine

## 2017-06-18 ENCOUNTER — Emergency Department (HOSPITAL_COMMUNITY)
Admission: EM | Admit: 2017-06-18 | Discharge: 2017-06-18 | Disposition: A | Discharge status: Home / Self Care | Payer: Self-pay | Attending: Emergency Medicine | Admitting: Emergency Medicine

## 2017-06-18 ENCOUNTER — Other Ambulatory Visit: Payer: Self-pay

## 2017-06-18 ENCOUNTER — Encounter (HOSPITAL_COMMUNITY): Payer: Self-pay | Admitting: Emergency Medicine

## 2017-06-18 DIAGNOSIS — I10 Essential (primary) hypertension: Secondary | ICD-10-CM | POA: Insufficient documentation

## 2017-06-18 DIAGNOSIS — R197 Diarrhea, unspecified: Secondary | ICD-10-CM | POA: Insufficient documentation

## 2017-06-18 DIAGNOSIS — Z7982 Long term (current) use of aspirin: Secondary | ICD-10-CM | POA: Insufficient documentation

## 2017-06-18 DIAGNOSIS — Z79899 Other long term (current) drug therapy: Secondary | ICD-10-CM | POA: Insufficient documentation

## 2017-06-18 DIAGNOSIS — E119 Type 2 diabetes mellitus without complications: Secondary | ICD-10-CM | POA: Insufficient documentation

## 2017-06-18 DIAGNOSIS — R109 Unspecified abdominal pain: Secondary | ICD-10-CM | POA: Insufficient documentation

## 2017-06-18 DIAGNOSIS — Z794 Long term (current) use of insulin: Secondary | ICD-10-CM | POA: Insufficient documentation

## 2017-06-18 DIAGNOSIS — R112 Nausea with vomiting, unspecified: Secondary | ICD-10-CM | POA: Insufficient documentation

## 2017-06-18 LAB — COMPREHENSIVE METABOLIC PANEL
ALBUMIN: 3.6 g/dL (ref 3.5–5.0)
ALK PHOS: 58 U/L (ref 38–126)
ALT: 12 U/L — ABNORMAL LOW (ref 17–63)
AST: 17 U/L (ref 15–41)
Anion gap: 9 (ref 5–15)
BILIRUBIN TOTAL: 0.8 mg/dL (ref 0.3–1.2)
BUN: 16 mg/dL (ref 6–20)
CALCIUM: 9 mg/dL (ref 8.9–10.3)
CO2: 25 mmol/L (ref 22–32)
Chloride: 104 mmol/L (ref 101–111)
Creatinine, Ser: 1.2 mg/dL (ref 0.61–1.24)
GFR calc Af Amer: >60 mL/min (ref >60)
GFR calc non Af Amer: >60 mL/min (ref >60)
GLUCOSE: 172 mg/dL — AB (ref 65–99)
Potassium: 3.7 mmol/L (ref 3.5–5.1)
Sodium: 138 mmol/L (ref 135–145)
TOTAL PROTEIN: 7 g/dL (ref 6.5–8.1)

## 2017-06-18 LAB — CBC
HCT: 37.8 % — ABNORMAL LOW (ref 39.0–52.0)
Hemoglobin: 13.1 g/dL (ref 13.0–17.0)
MCH: 30.4 pg (ref 26.0–34.0)
MCHC: 34.7 g/dL (ref 30.0–36.0)
MCV: 87.7 fL (ref 78.0–100.0)
Platelets: 313 10*3/uL (ref 150–400)
RBC: 4.31 MIL/uL (ref 4.22–5.81)
RDW: 12.7 % (ref 11.5–15.5)
WBC: 6.5 10*3/uL (ref 4.0–10.5)

## 2017-06-18 LAB — LIPASE, BLOOD: Lipase: 31 U/L (ref 11–51)

## 2017-06-18 MED ORDER — SODIUM CHLORIDE 0.9 % IV BOLUS
1000.0000 mL | Freq: Once | INTRAVENOUS | Status: AC
  Administered 2017-06-18: 1000 mL via INTRAVENOUS

## 2017-06-18 MED ORDER — ONDANSETRON HCL 4 MG/2ML IJ SOLN
4.0000 mg | Freq: Once | INTRAMUSCULAR | Status: AC
Start: 2017-06-18 — End: 2017-06-18
  Administered 2017-06-18: 4 mg via INTRAVENOUS
  Filled 2017-06-18: qty 2

## 2017-06-18 MED ORDER — ONDANSETRON HCL 4 MG PO TABS: 4.0000 mg | ORAL_TABLET | Freq: Four times a day (QID) | ORAL | 0 refills | Status: DC

## 2017-06-18 MED ORDER — PANTOPRAZOLE SODIUM 20 MG PO TBEC: 20.0000 mg | DELAYED_RELEASE_TABLET | Freq: Every day | ORAL | 0 refills | Status: DC

## 2017-06-18 MED ORDER — FAMOTIDINE IN NACL 20-0.9 MG/50ML-% IV SOLN
20.0000 mg | Freq: Once | INTRAVENOUS | Status: AC
  Administered 2017-06-18: 20 mg via INTRAVENOUS
  Filled 2017-06-18: qty 50

## 2017-06-18 MED FILL — $LANTUS SOLOSTAR 100 UNITS/: 100 | 30 days supply | Qty: 9 | Fill #3

## 2017-06-18 MED FILL — !HUMALOG 100 UNITS/ML KWIKP: 100 | 28 days supply | Qty: 3 | Fill #3

## 2017-06-18 MED FILL — GABAPENTIN 300 MG CAPSULE: 300 | 30 days supply | Qty: 60 | Fill #3

## 2017-06-18 MED FILL — ?ATORVASTATIN 10MG TABLET: 10 | 30 days supply | Qty: 30 | Fill #3

## 2017-06-18 MED FILL — SERTRALINE HCL 50 MG TABS: 50 | 30 days supply | Qty: 30 | Fill #2

## 2017-06-18 MED FILL — ?AMLODIPINE BESYLATE 5 MG T: 5 MG | 30 days supply | Qty: 30 | Fill #3

## 2017-06-18 MED FILL — LISINOPRIL 10 MG TABS: 10 | 30 days supply | Qty: 30 | Fill #2

## 2017-06-18 NOTE — ED Notes (Signed)
Discharge instructions and prescriptions discussed with Pt. Pt verbalized understanding. Pt stable and ambulatory.   

## 2017-06-18 NOTE — ED Triage Notes (Signed)
abd pain n/v/d x 1 week with back pain

## 2017-06-18 NOTE — ED Notes (Signed)
Urine attempted.

## 2017-06-18 NOTE — ED Provider Notes (Signed)
[facility]PART[facility] Provider Note   CSN: [id number] Arrival date & time: 06/18/17  1406     History   Chief Complaint Chief Complaint  Patient presents with  . Abdominal Pain    HPI Ryan Jenkins is a 57 y.o. male presenting for evaluation of nausea, vomiting, diarrhea, and abdominal pain.  Patient states that on Thursday, he started to develop diarrhea.  The next day, he had some nausea and associated vomiting.  He reports intermittent abdominal pain, epigastric and lower abdominal which is present after eating or while having a bowel movement.  No pain at other times.  He is no longer vomiting, but reports continued nausea.  He denies fevers, chills, chest pain, shortness of breath, or abnormal urination.  There is no blood in his stool or vomitus.  He reports low back pain, states this is chronic and has been aggravated since the vomiting.  No pain rating directly to his middle back.  He has not been able to take any of his medications today including his blood pressure medicines.  He denies sick contacts.  No recent travel.  No history of abdominal problems including IBS, IBD, UC, Crohn's.  No history of abdominal surgeries.  HPI  Past Medical History:  Diagnosis Date  . Diabetes mellitus without complication ([place])   . Hypertension     Patient Active Problem List   Diagnosis Date Noted  . Hyperlipidemia 03/21/2017  . Vitamin B 12 deficiency 03/21/2017  . Uncontrolled type 2 diabetes mellitus with peripheral neuropathy ([place]) 02/05/2017  . Essential hypertension 02/05/2017  . Depression 02/05/2017  . Unintended weight loss 02/05/2017  . Gait disturbance 02/05/2017  . 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 Diabetic neuropathy, type II diabetes mellitus ([place]) 05/11/2014  . 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 CATARACT EXTRACTION Bilateral 2017        Home Medications     Prior to Admission medications   Medication Sig Start Date End Date Taking? Authorizing Provider  amLODipine (NORVASC) 5 MG tablet Take 1 tablet (5 mg total) by mouth daily. 02/05/17  Yes Ladell Pier, MD  aspirin EC 81 MG tablet Take 1 tablet (81 mg total) by mouth daily. 02/05/17  Yes Ladell Pier, MD  atorvastatin (LIPITOR) 10 MG tablet Take 1 tablet (10 mg total) by mouth daily. 02/06/17  Yes Ladell Pier, MD  bismuth subsalicylate (PEPTO BISMOL) 262 MG/15ML suspension Take 30 mLs by mouth every 6 (six) hours as needed for indigestion.   Yes [provider]  gabapentin (NEURONTIN) 300 MG capsule Take 2 capsules (600 mg total) by mouth 3 (three) times daily. 1 cap PO QHS x 1 wk then BID 03/21/17  Yes Ladell Pier, MD  Insulin Glargine (LANTUS SOLOSTAR) 100 UNIT/ML Solostar Pen Inject 30 Units into the skin daily at 10 pm. 02/05/17  Yes Ladell Pier, MD  insulin lispro (HUMALOG KWIKPEN) 100 UNIT/ML KiwkPen 5 units subcut with the two largest meals of the day 05/01/17  Yes Ladell Pier, MD  lisinopril (PRINIVIL,ZESTRIL) 10 MG tablet Take 1 tablet (10 mg total) by mouth daily. 04/04/17  Yes Ladell Pier, MD  sertraline (ZOLOFT) 50 MG tablet Take 1 tablet (50 mg total) by mouth daily. 1/2 tab daily x 2 wks then 1 tab daily PO 03/21/17  Yes Ladell Pier, MD  vitamin B-12 (CYANOCOBALAMIN) 1000 MCG tablet Take 1 tablet (1,000  mcg total) by mouth daily. 02/06/17  Yes Ladell Pier, MD  Blood Glucose Monitoring Suppl (TRUE METRIX METER) w/Device KIT Use as directed 02/05/17   Ladell Pier, MD  glucose blood (TRUE METRIX BLOOD GLUCOSE TEST) test strip Use as instructed 02/05/17   Ladell Pier, MD  Insulin Pen Needle (PEN NEEDLES) 31G X 8 MM MISC Use as directed 02/05/17   Ladell Pier, MD  mupirocin ointment (BACTROBAN) 2 % Place 1 application into the nose 2 (two) times daily. Patient not taking: Reported on 06/18/2017 03/21/17   Ladell Pier, MD  ondansetron (ZOFRAN) 4 MG tablet Take 1 tablet (4 mg total) by mouth every 6 (six) hours. 06/18/17   Chevon Fomby, PA-C  pantoprazole (PROTONIX) 20 MG tablet Take 1 tablet (20 mg total) by mouth daily. 06/18/17   Iraida Cragin, PA-C  pneumococcal 23 valent vaccine (PNEUMOVAX 23) 25 MCG/0.5ML injection TO BE ADMINISTERED BY THE PHARMACIST 02/06/17   Ladell Pier, MD  TRUEPLUS LANCETS 28G MISC Use as directed 02/05/17   Ladell Pier, MD    Family History Family History  Problem Relation Age of Onset  . Renal cancer Mother   . Hypertension Mother   . Hypertension Sister   . Leukemia Maternal Uncle     Social History Social History   Tobacco Use  . Smoking status: Never Smoker  . Smokeless tobacco: Never Used  Substance Use Topics  . Alcohol use: No    Alcohol/week: 0.0 oz  . Drug use: No     Allergies   Claritin [loratadine] and Lyrica [pregabalin]   Review of Systems Review of Systems  Constitutional: Negative for chills and fever.  HENT: Negative for congestion.   Respiratory: Negative for cough and shortness of breath.   Gastrointestinal: Positive for abdominal pain, diarrhea, nausea and vomiting.  Genitourinary: Negative for dysuria, frequency and hematuria.  Musculoskeletal: Positive for back pain (chronic).  Skin: Negative for pallor and rash.  Allergic/Immunologic: Negative for immunocompromised state.  Neurological: Negative for dizziness and headaches.  Hematological: Does not bruise/bleed easily.  Psychiatric/Behavioral: Negative for confusion.     Physical Exam Updated Vital Signs BP (!) 190/104 (BP Location: Right Arm)   Pulse 98   Temp 98 F (36.7 C) (Oral)   Resp 18   SpO2 100%   Physical Exam  Constitutional: He is oriented to person, place, and time. He appears well-developed and well-nourished. No distress.  Resting comfortably in no apparent distress  HENT:  Head: Normocephalic and atraumatic.  Eyes: Pupils  are equal, round, and reactive to light. Conjunctivae and EOM are normal.  Neck: Normal range of motion. Neck supple.  Cardiovascular: Normal rate, regular rhythm and intact distal pulses.  Pulmonary/Chest: Effort normal and breath sounds normal. No respiratory distress. He has no wheezes.  Abdominal: Soft. Bowel sounds are normal. He exhibits no distension and no mass. There is tenderness. There is no rebound and no guarding.  Mild tenderness to palpation of epigastric abdomen.  No obvious distention.  Soft without rigidity, guarding, or distention.  Bowel sounds normoactive.  Musculoskeletal: Normal range of motion. He exhibits no edema or tenderness.  Neurological: He is alert and oriented to person, place, and time.  Skin: Skin is warm and dry.  Psychiatric: He has a normal mood and affect.  Nursing note and vitals reviewed.    ED Treatments / Results  Labs (all labs ordered are listed, but only abnormal results are displayed) Labs Reviewed  COMPREHENSIVE  METABOLIC PANEL - Abnormal; Notable for the following components:      Result Value   Glucose, Bld 172 (*)    ALT 12 (*)    All other components within normal limits  CBC - Abnormal; Notable for the following components:   HCT 37.8 (*)    All other components within normal limits  LIPASE, BLOOD  URINALYSIS, ROUTINE W REFLEX MICROSCOPIC    EKG None  Radiology No results found.  Procedures Procedures (including critical care time)  Medications Ordered in ED Medications  sodium chloride 0.9 % bolus 1,000 mL (1,000 mLs Intravenous New Bag/Given 06/18/17 1947)  famotidine (PEPCID) IVPB 20 mg premix (20 mg Intravenous New Bag/Given 06/18/17 1948)  ondansetron (ZOFRAN) injection 4 mg (4 mg Intravenous Given 06/18/17 1947)     Initial Impression / Assessment and Plan / ED Course  I have reviewed the triage vital signs and the nursing notes.  Pertinent labs & imaging results that were available during my care of the patient  were reviewed by me and considered in my medical decision making (see chart for details).     Patient presenting for evaluation of nausea, vomiting, diarrhea, and abdominal pain.  Vomiting has resolved.  No fevers.  Mild tenderness palpation of epigastric abdomen, present with eating and bowel movements.  Initial labs reassuring, no leukocytosis.  Electrolytes stable.  Hemoglobin stable.  Creatinine stable.  Doubt significant dehydration, although patient is slightly tachycardic, may be mild dehydration.  Blood pressure is elevated, although patient states he has not been able to take his home blood pressure medications.  At this time, doubt intra-abdominal infection, perforation, obstruction, or surgical abdomen.  I do not believe CT scan would be beneficial.  Will treat symptomatically, reassess, and plan for discharge.  Patient reports improvement of symptoms with fluids, Zofran, Pepcid.  HR has improved. At this time, likely viral GI versus food poisoning.  Doubt surgical abdomen.  Discussed with patient.  Discussed importance of taking his home medications including his blood pressure.  Discussed food choices to relieve diarrhea.  Discussed follow-up with PCP for reevaluation to ensure that his symptoms have resolved and his blood pressure has improved.  Strict return precautions given.  At this time, patient appears safe for discharge. Pt states he understands and agrees to plan.   Final Clinical Impressions(s) / ED Diagnoses   Final diagnoses:  Nausea vomiting and diarrhea  Intermittent abdominal pain    ED Discharge Orders        Ordered    ondansetron (ZOFRAN) 4 MG tablet  Every 6 hours     06/18/17 2009    pantoprazole (PROTONIX) 20 MG tablet  Daily     06/18/17 2009       [place], PA-C 06/18/17 2014    Little, Wenda Over[place], MD 06/19/17 1424

## 2017-06-18 NOTE — Discharge Instructions (Signed)
Take Protonix daily.  Use Zofran as needed for nausea or vomiting. Continue taking your home medications as prescribed. Make sure you are staying well-hydrated with water. Avoid fatty, spicy, greasy, and dairy foods.  Avoid sugary drinks. Follow-up with your primary care doctor if your symptoms are not improving. Return to the emergency room if you develop high fevers, blood in your stool, your abdomen becomes very distended or hard, you have severe/constant abdominal pain, or with any new or concerning symptoms.

## 2017-06-25 ENCOUNTER — Encounter: Payer: Self-pay | Admitting: Internal Medicine

## 2017-06-25 ENCOUNTER — Ambulatory Visit: Payer: Self-pay | Attending: Internal Medicine | Admitting: Internal Medicine

## 2017-06-25 VITALS — BP 164/94 | HR 104 | Temp 98.3°F | Resp 16 | Wt 209.6 lb

## 2017-06-25 DIAGNOSIS — Z8051 Family history of malignant neoplasm of kidney: Secondary | ICD-10-CM | POA: Insufficient documentation

## 2017-06-25 DIAGNOSIS — Z8249 Family history of ischemic heart disease and other diseases of the circulatory system: Secondary | ICD-10-CM | POA: Insufficient documentation

## 2017-06-25 DIAGNOSIS — G8929 Other chronic pain: Secondary | ICD-10-CM | POA: Insufficient documentation

## 2017-06-25 DIAGNOSIS — E785 Hyperlipidemia, unspecified: Secondary | ICD-10-CM | POA: Insufficient documentation

## 2017-06-25 DIAGNOSIS — Z79899 Other long term (current) drug therapy: Secondary | ICD-10-CM | POA: Insufficient documentation

## 2017-06-25 DIAGNOSIS — Z9181 History of falling: Secondary | ICD-10-CM | POA: Insufficient documentation

## 2017-06-25 DIAGNOSIS — E1121 Type 2 diabetes mellitus with diabetic nephropathy: Secondary | ICD-10-CM | POA: Insufficient documentation

## 2017-06-25 DIAGNOSIS — Z794 Long term (current) use of insulin: Secondary | ICD-10-CM | POA: Insufficient documentation

## 2017-06-25 DIAGNOSIS — M545 Low back pain: Secondary | ICD-10-CM | POA: Insufficient documentation

## 2017-06-25 DIAGNOSIS — I10 Essential (primary) hypertension: Secondary | ICD-10-CM | POA: Insufficient documentation

## 2017-06-25 DIAGNOSIS — R269 Unspecified abnormalities of gait and mobility: Secondary | ICD-10-CM | POA: Insufficient documentation

## 2017-06-25 DIAGNOSIS — E1142 Type 2 diabetes mellitus with diabetic polyneuropathy: Secondary | ICD-10-CM | POA: Insufficient documentation

## 2017-06-25 DIAGNOSIS — IMO0002 Reserved for concepts with insufficient information to code with codable children: Secondary | ICD-10-CM

## 2017-06-25 DIAGNOSIS — Z7982 Long term (current) use of aspirin: Secondary | ICD-10-CM | POA: Insufficient documentation

## 2017-06-25 DIAGNOSIS — E538 Deficiency of other specified B group vitamins: Secondary | ICD-10-CM | POA: Insufficient documentation

## 2017-06-25 DIAGNOSIS — E1165 Type 2 diabetes mellitus with hyperglycemia: Secondary | ICD-10-CM

## 2017-06-25 DIAGNOSIS — F329 Major depressive disorder, single episode, unspecified: Secondary | ICD-10-CM | POA: Insufficient documentation

## 2017-06-25 LAB — POCT GLYCOSYLATED HEMOGLOBIN (HGB A1C): HbA1c, POC (controlled diabetic range): 8.3 % — AB (ref 0.0–7.0)

## 2017-06-25 LAB — GLUCOSE, POCT (MANUAL RESULT ENTRY)
POC Glucose: 64 mg/dl — AB (ref 70–99)
POC Glucose: 119 mg/dl — AB (ref 70–99)

## 2017-06-25 MED ORDER — LISINOPRIL 10 MG PO TABS: 15.0000 mg | ORAL_TABLET | Freq: Every day | ORAL | 2 refills | Status: DC

## 2017-06-25 MED ORDER — DAPAGLIFLOZIN PROPANEDIOL 5 MG PO TABS: 5.0000 mg | ORAL_TABLET | Freq: Every day | ORAL | 2 refills | Status: DC

## 2017-06-25 MED ORDER — GABAPENTIN 300 MG PO CAPS: 600.0000 mg | ORAL_CAPSULE | Freq: Three times a day (TID) | ORAL | 6 refills | Status: DC

## 2017-06-25 MED FILL — FARXIGA 5 MG TABLET: 5 | 30 days supply | Qty: 30 | Fill #0

## 2017-06-25 NOTE — Patient Instructions (Addendum)
Increase Lisinopril to 10 mg 1 1/2 tablets daily.   Your diabetes is better.  We will add a new medication called [place].  Continue your insulins.    Always eat right away when you take Humalog insulin.

## 2017-06-25 NOTE — Progress Notes (Signed)
Patient ID: Ryan Jenkins, male    DOB: [birth year]  MRN: [id number]  CC: Diabetes; Hypertension; and Hospitalization Follow-up (ED)   Subjective: Ryan Jenkins is a 57 y.o. male who presents for chronic disease management. His concerns today include:  Pt with hx of DM with neuropathy (in feet and hands), macroalbuminuria, HTN, HL, Vit B12 def chronic LBP  DM:  Checking BID in a.m and  Before dinner. A.m BS b/w 96-110 and before dinner 110-200.  No lows.  However blood sugar this morning in the office is 65.  Patient states he was rushing to come to this appointment and took his 5 units of Humalog and did not get to eat breakfast. Eating habits:  "I've cut out a lot of stuff.  I'm on [employer] for the first time same like." Exercise: trying to walk 1 mile 3 x a wk   Referred to physical therapy on last visit for gait disturbance and history of falls.  Was called by P.T and had appt scheduled but missed because he was sick.  Trip over edge of chair and fell forward 1 wk ago.   HTN:  Compliant with Norvasc and Lisinopril.  Took already for today.  Body pain is increasing.  Feet "feels like lead and poor grip."  Gabapentin was increased to 600 mg 3 times a day.  However patient states that he is only been taking it twice a day otherwise he runs out before month and.  Reports that the pharmacy has not been giving him enough to last the full month.  I spoke with the pharmacy and apparently they have been filling with last orders.  I will need to send a new updated prescription  Patient Active Problem List   Diagnosis Date Noted  . Hyperlipidemia 03/21/2017  . Vitamin B 12 deficiency 03/21/2017  . Uncontrolled type 2 diabetes mellitus with peripheral neuropathy ([place]) 02/05/2017  . Essential hypertension 02/05/2017  . Depression 02/05/2017  . Unintended weight loss 02/05/2017  . Gait disturbance 02/05/2017  . 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 Diabetic neuropathy, type II  diabetes mellitus ([place]) 05/11/2014  . Metatarsal deformity 05/11/2014     Current Outpatient Medications on File Prior to Visit  Medication Sig Dispense Refill  . amLODipine (NORVASC) 5 MG tablet Take 1 tablet (5 mg total) by mouth daily. 90 tablet 3  . aspirin EC 81 MG tablet Take 1 tablet (81 mg total) by mouth daily. 100 tablet 4  . atorvastatin (LIPITOR) 10 MG tablet Take 1 tablet (10 mg total) by mouth daily. 90 tablet 3  . bismuth subsalicylate (PEPTO BISMOL) 262 MG/15ML suspension Take 30 mLs by mouth every 6 (six) hours as needed for indigestion.    . Blood Glucose Monitoring Suppl (TRUE METRIX METER) w/Device KIT Use as directed 1 kit 0  . glucose blood (TRUE METRIX BLOOD GLUCOSE TEST) test strip Use as instructed 100 each 12  . Insulin Glargine (LANTUS SOLOSTAR) 100 UNIT/ML Solostar Pen Inject 30 Units into the skin daily at 10 pm. 5 pen 3  . insulin lispro (HUMALOG KWIKPEN) 100 UNIT/ML KiwkPen 5 units subcut with the two largest meals of the day 15 mL 11  . Insulin Pen Needle (PEN NEEDLES) 31G X 8 MM MISC Use as directed 100 each 6  . mupirocin ointment (BACTROBAN) 2 % Place 1 application into the nose 2 (two) times daily. (Patient not taking: Reported on 06/18/2017) 22 g 0  . ondansetron (ZOFRAN) 4 MG  tablet Take 1 tablet (4 mg total) by mouth every 6 (six) hours. 12 tablet 0  . pantoprazole (PROTONIX) 20 MG tablet Take 1 tablet (20 mg total) by mouth daily. 10 tablet 0  . pneumococcal 23 valent vaccine (PNEUMOVAX 23) 25 MCG/0.5ML injection TO BE ADMINISTERED BY THE PHARMACIST 0.5 mL 0  . sertraline (ZOLOFT) 50 MG tablet Take 1 tablet (50 mg total) by mouth daily. 1/2 tab daily x 2 wks then 1 tab daily PO 30 tablet 6  . TRUEPLUS LANCETS 28G MISC Use as directed 100 each 1  . vitamin B-12 (CYANOCOBALAMIN) 1000 MCG tablet Take 1 tablet (1,000 mcg total) by mouth daily. 100 tablet 3   No current facility-administered medications on file prior to visit.     Allergies  Allergen  Reactions  . Claritin [Loratadine] Swelling    Joint swelling   . Lyrica [Pregabalin]     depression    Social History   Socioeconomic History  . Marital status: Single    Spouse name: Not on file  . Number of children: Not on file  . Years of education: 67  . Highest education level: Not on file  Occupational History  . 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 Financial resource strain: Not on file  . Food insecurity:    Worry: Not on file    Inability: Not on file  . Transportation needs:    Medical: Not on file    Non-medical: Not on file  Tobacco Use  . Smoking status: Never Smoker  . Smokeless tobacco: Never Used  Substance and Sexual Activity  . Alcohol use: No    Alcohol/week: 0.0 oz  . Drug use: No  . Sexual activity: Yes  Lifestyle  . Physical activity:    Days per week: Not on file    Minutes per session: Not on file  . Stress: Not on file  Relationships  . Social connections:    Talks on phone: Not on file    Gets together: Not on file    Attends religious service: Not on file    Active member of club or organization: Not on file    Attends meetings of clubs or organizations: Not on file    Relationship status: Not on file  . Intimate partner violence:    Fear of current or ex partner: Not on file    Emotionally abused: Not on file    Physically abused: Not on file    Forced sexual activity: Not on file  Other Topics Concern  . Not on file  Social History Narrative  . Not on file    Family History  Problem Relation Age of Onset  . Renal cancer Mother   . Hypertension Mother   . Hypertension Sister   . Leukemia Maternal Uncle     Past Surgical History:  Procedure Laterality Date  . CATARACT EXTRACTION Bilateral 2017    ROS: Review of Systems Negative except as stated above   PHYSICAL EXAM: BP (!) 164/94   Pulse (!) 104   Temp 98.3 F (36.8 C) (Oral)   Resp 16   Wt 209 lb 9.6 oz (95.1 kg)   SpO2  100%   BMI 26.91 kg/m   BP 150/80  Physical Exam General appearance - alert, well appearing, and in no distress Mental status - normal mood, behavior, speech, dress, motor activity, and thought processes Neck - supple, no significant adenopathy Chest -  clear to auscultation, no wheezes, rales or rhonchi, symmetric air entry Heart - normal rate, regular rhythm, normal S1, S2, no murmurs, rubs, clicks or gallops Extremities - peripheral pulses normal, no pedal edema, no clubbing or cyanosis MSK: Patient ambulates with a cane.  Gait is wide-based. Results for orders placed or performed in visit on 06/25/17  POCT glucose (manual entry)  Result Value Ref Range   POC Glucose 64 (A) 70 - 99 mg/dl  POCT glycosylated hemoglobin (Hb A1C)  Result Value Ref Range   Hemoglobin A1C  4.0 - 5.6 %   HbA1c, POC (prediabetic range)  5.7 - 6.4 %   HbA1c, POC (controlled diabetic range) 8.3 (A) 0.0 - 7.0 %  POCT glucose (manual entry)  Result Value Ref Range   POC Glucose 119 (A) 70 - 99 mg/dl   ASSESSMENT AND PLAN: 1. Uncontrolled type 2 diabetes mellitus with peripheral neuropathy (HCC) A1c has improved to 8.3 today.  Commended him on this progress.  Continue healthy eating habits and regular exercise. -Add low-dose Farxiga -Patient given a glass of juice with a fruit cocktail today to get his blood sugar up.  Advised patient that he has to eat immediately after giving himself Humalog to avoid hypoglycemic episodes in the future. - POCT glucose (manual entry) - POCT glycosylated hemoglobin (Hb A1C) - dapagliflozin propanediol (FARXIGA) 5 MG TABS tablet; Take 5 mg by mouth daily.  Dispense: 90 tablet; Refill: 2 - POCT glucose (manual entry) - gabapentin (NEURONTIN) 300 MG capsule; Take 2 capsules (600 mg total) by mouth 3 (three) times daily.  Dispense: 180 capsule; Refill: 6  2. Essential hypertension Not at goal.  Will increase lisinopril to 15 mg daily.  This should also help decrease the  macroalbumin - lisinopril (PRINIVIL,ZESTRIL) 10 MG tablet; Take 1.5 tablets (15 mg total) by mouth daily.  Dispense: 135 tablet; Refill: 2  3. Gait disturbance We will resubmit referral to physical therapy.  Encourage patient to keep appointment - Ambulatory referral to Physical Therapy  4. History of falling - Ambulatory referral to Physical Therapy  5. Macroalbuminuric diabetic nephropathy (HCC) - Microalbumin / creatinine urine ratio  Patient was given the opportunity to ask questions.  Patient verbalized understanding of the plan and was able to repeat key elements of the plan.   Orders Placed This Encounter  Procedures  . Microalbumin / creatinine urine ratio  . Ambulatory referral to Physical Therapy  . POCT glucose (manual entry)  . POCT glycosylated hemoglobin (Hb A1C)  . POCT glucose (manual entry)     Requested Prescriptions   Signed Prescriptions Disp Refills  . dapagliflozin propanediol (FARXIGA) 5 MG TABS tablet 90 tablet 2    Sig: Take 5 mg by mouth daily.  [name] lisinopril (PRINIVIL,ZESTRIL) 10 MG tablet 135 tablet 2    Sig: Take 1.5 tablets (15 mg total) by mouth daily.  [name] gabapentin (NEURONTIN) 300 MG capsule 180 capsule 6    Sig: Take 2 capsules (600 mg total) by mouth 3 (three) times daily.    Return in about 3 months (around 09/25/2017).  Karle Plumber, MD, FACP

## 2017-07-15 MED FILL — PANTOPRAZOLE SOD DR 20 MG T: 20 | 10 days supply | Qty: 10 | Fill #0

## 2017-07-15 MED FILL — ?ONDANSETRON HCL 4 MG TABLE: 4 | 3 days supply | Qty: 12 | Fill #0

## 2017-07-31 MED FILL — $HUMALOG 100 UNITS/ML KWIKP: 100 | 28 days supply | Qty: 3 | Fill #4

## 2017-07-31 MED FILL — GABAPENTIN 300 MG CAPSULE: 300 | 30 days supply | Qty: 180 | Fill #0

## 2017-07-31 MED FILL — LISINOPRIL 10 MG TABS: 10 | 30 days supply | Qty: 30 | Fill #3

## 2017-07-31 MED FILL — ?ATORVASTATIN 10MG TABLET: 10 | 30 days supply | Qty: 30 | Fill #4

## 2017-07-31 MED FILL — $LANTUS SOLOSTAR 100 UNITS/: 100 | 30 days supply | Qty: 9 | Fill #4

## 2017-07-31 MED FILL — ?AMLODIPINE BESYLATE 5 MG T: 5 MG | 30 days supply | Qty: 30 | Fill #4

## 2017-07-31 MED FILL — SERTRALINE HCL 50 MG TABS: 50 | 30 days supply | Qty: 30 | Fill #3

## 2017-08-07 ENCOUNTER — Ambulatory Visit: Payer: Self-pay | Attending: Family Medicine | Admitting: Physician Assistant

## 2017-08-07 VITALS — BP 137/78 | HR 106 | Temp 98.2°F | Resp 18 | Ht 74.0 in | Wt 209.0 lb

## 2017-08-07 DIAGNOSIS — Z79899 Other long term (current) drug therapy: Secondary | ICD-10-CM | POA: Insufficient documentation

## 2017-08-07 DIAGNOSIS — E1142 Type 2 diabetes mellitus with diabetic polyneuropathy: Secondary | ICD-10-CM | POA: Insufficient documentation

## 2017-08-07 DIAGNOSIS — Z794 Long term (current) use of insulin: Secondary | ICD-10-CM | POA: Insufficient documentation

## 2017-08-07 DIAGNOSIS — E1165 Type 2 diabetes mellitus with hyperglycemia: Secondary | ICD-10-CM

## 2017-08-07 DIAGNOSIS — Z888 Allergy status to other drugs, medicaments and biological substances status: Secondary | ICD-10-CM | POA: Insufficient documentation

## 2017-08-07 DIAGNOSIS — N529 Male erectile dysfunction, unspecified: Secondary | ICD-10-CM | POA: Insufficient documentation

## 2017-08-07 DIAGNOSIS — Z7982 Long term (current) use of aspirin: Secondary | ICD-10-CM | POA: Insufficient documentation

## 2017-08-07 DIAGNOSIS — IMO0002 Reserved for concepts with insufficient information to code with codable children: Secondary | ICD-10-CM

## 2017-08-07 DIAGNOSIS — I10 Essential (primary) hypertension: Secondary | ICD-10-CM | POA: Insufficient documentation

## 2017-08-07 DIAGNOSIS — R1084 Generalized abdominal pain: Secondary | ICD-10-CM | POA: Insufficient documentation

## 2017-08-07 LAB — GLUCOSE, POCT (MANUAL RESULT ENTRY): POC Glucose: 171 mg/dl — AB (ref 70–99)

## 2017-08-07 NOTE — Progress Notes (Signed)
Patient ID: Ryan Jenkins, male   DOB: [birth year], 57 y.o.   MRN: [id number]   Romir Klimowicz, is a 57 y.o. male  BEE:[id number]  JOI:[id number]  DOB - March 19, 1960  Subjective:  Chief Complaint and HPI: Ryan Jenkins is a 57 y.o. male here today for "dumping."  He says he is having 2-3 large loose BMs daily.  First seen for this about 2 months ago.  At that time, labs unremarkable.  No melena, no hematochezia.  No dysphagia.  Appetite is ok.  Some nausea and bloating and has vomited about 2X/week.  No weight loss since s/sx started.    Also c/o erectile dysfunction.    ROS:   Constitutional:  No f/c, No night sweats, No unexplained weight loss. EENT:  No vision changes, No blurry vision, No hearing changes. No mouth, throat, or ear problems.  Respiratory: No cough, No SOB Cardiac: No CP, no palpitations GI:  + abd pain, + N/V/D. GU: No Urinary s/sx Musculoskeletal: No joint pain Neuro: No headache, no dizziness, no motor weakness.  Skin: No rash Endocrine:  No polydipsia. No polyuria.  Psych: Denies SI/HI  No problems updated.  ALLERGIES: Allergies  Allergen Reactions  . Claritin [Loratadine] Swelling    Joint swelling   . Lyrica [Pregabalin]     depression    PAST MEDICAL HISTORY: Past Medical History:  Diagnosis Date  . Diabetes mellitus without complication ([place])   . Hypertension     MEDICATIONS AT HOME: Prior to Admission medications   Medication Sig Start Date End Date Taking? Authorizing Provider  amLODipine (NORVASC) 5 MG tablet Take 1 tablet (5 mg total) by mouth daily. 02/05/17  Yes Ladell Pier, MD  aspirin EC 81 MG tablet Take 1 tablet (81 mg total) by mouth daily. 02/05/17  Yes Ladell Pier, MD  atorvastatin (LIPITOR) 10 MG tablet Take 1 tablet (10 mg total) by mouth daily. 02/06/17  Yes Ladell Pier, MD  bismuth subsalicylate (PEPTO BISMOL) 262 MG/15ML suspension Take 30 mLs by mouth every 6 (six) hours as needed for indigestion.    Yes [provider]  Blood Glucose Monitoring Suppl (TRUE METRIX METER) w/Device KIT Use as directed 02/05/17  Yes Ladell Pier, MD  dapagliflozin propanediol (FARXIGA) 5 MG TABS tablet Take 5 mg by mouth daily. 06/25/17  Yes Ladell Pier, MD  gabapentin (NEURONTIN) 300 MG capsule Take 2 capsules (600 mg total) by mouth 3 (three) times daily. 06/25/17  Yes Ladell Pier, MD  glucose blood (TRUE METRIX BLOOD GLUCOSE TEST) test strip Use as instructed 02/05/17  Yes Ladell Pier, MD  Insulin Glargine (LANTUS SOLOSTAR) 100 UNIT/ML Solostar Pen Inject 30 Units into the skin daily at 10 pm. 02/05/17  Yes Ladell Pier, MD  insulin lispro (HUMALOG KWIKPEN) 100 UNIT/ML KiwkPen 5 units subcut with the two largest meals of the day 05/01/17  Yes Ladell Pier, MD  Insulin Pen Needle (PEN NEEDLES) 31G X 8 MM MISC Use as directed 02/05/17  Yes Ladell Pier, MD  lisinopril (PRINIVIL,ZESTRIL) 10 MG tablet Take 1.5 tablets (15 mg total) by mouth daily. 06/25/17  Yes Ladell Pier, MD  ondansetron (ZOFRAN) 4 MG tablet Take 1 tablet (4 mg total) by mouth every 6 (six) hours. 06/18/17  Yes Caccavale, Sophia, PA-C  pantoprazole (PROTONIX) 20 MG tablet Take 1 tablet (20 mg total) by mouth daily. 06/18/17  Yes Caccavale, Sophia, PA-C  sertraline (ZOLOFT) 50 MG tablet Take 1 tablet (50  mg total) by mouth daily. 1/2 tab daily x 2 wks then 1 tab daily PO 03/21/17  Yes Ladell Pier, MD  TRUEPLUS LANCETS 28G MISC Use as directed 02/05/17  Yes Ladell Pier, MD  vitamin B-12 (CYANOCOBALAMIN) 1000 MCG tablet Take 1 tablet (1,000 mcg total) by mouth daily. 02/06/17  Yes Ladell Pier, MD  mupirocin ointment (BACTROBAN) 2 % Place 1 application into the nose 2 (two) times daily. Patient not taking: Reported on 06/18/2017 03/21/17   Ladell Pier, MD  pneumococcal 23 valent vaccine (PNEUMOVAX 23) 25 MCG/0.5ML injection TO BE ADMINISTERED BY THE PHARMACIST Patient not taking:  Reported on 08/07/2017 02/06/17   Ladell Pier, MD     Objective:  EXAM:   Vitals:   08/07/17 1342  BP: 137/78  Pulse: (!) 106  Resp: 18  Temp: 98.2 F (36.8 C)  TempSrc: Oral  SpO2: 98%  Weight: 209 lb (94.8 kg)  Height: 6' 2"  (1.88 m)    General appearance : A&OX3. NAD. Non-toxic-appearing HEENT: Atraumatic and Normocephalic.  PERRLA. EOM intact.  TM clear B. Mouth-MMM, post pharynx WNL w/o erythema, No PND. Neck: supple, no JVD. No cervical lymphadenopathy. No thyromegaly Chest/Lungs:  Breathing-non-labored, Good air entry bilaterally, breath sounds normal without rales, rhonchi, or wheezing  CVS: S1 S2 regular, no murmurs, gallops, rubs  Abdomen: Bowel sounds present, Non tender and not distended with no gaurding, rigidity or rebound. Extremities: Bilateral Lower Ext shows no edema, both legs are warm to touch with = pulse throughout Neurology:  CN II-XII grossly intact, Non focal.   Psych:  TP linear. J/I WNL. Normal speech. Appropriate eye contact and affect.  Skin:  No Rash  Data Review Lab Results  Component Value Date   HGBA1C 8.3 (A) 06/25/2017   HGBA1C 11.2 02/05/2017     Assessment & Plan   1. Uncontrolled type 2 diabetes mellitus with peripheral neuropathy ([place]) Work on diet/exercise - Glucose (CBG)  2. Erectile dysfunction, unspecified erectile dysfunction type - Ambulatory referral to Urology  3. Generalized abdominal pain No red flags/non-acute abdomen - Comprehensive metabolic panel - Lipase -h. pylori - CBC with Differential/Platelet - Ambulatory referral to Gastroenterology   Patient have been counseled extensively about nutrition and exercise  Return for keep 09/27/2017 appt.  The patient was given clear instructions to go to ER or return to medical center if symptoms don't improve, worsen or new problems develop. The patient verbalized understanding. The patient was told to call to get lab results if they haven't heard anything in  the next week.     Tritz Caldron, PA-C [facility] and [facility] [place], [place]   08/07/2017, 1:54 PM

## 2017-08-08 LAB — CBC WITH DIFFERENTIAL/PLATELET
BASOS: 0 %
Basophils Absolute: 0 10*3/uL (ref 0.0–0.2)
EOS (ABSOLUTE): 0.2 10*3/uL (ref 0.0–0.4)
EOS: 2 %
Hematocrit: 40.6 % (ref 37.5–51.0)
Hemoglobin: 13.3 g/dL (ref 13.0–17.7)
IMMATURE GRANS (ABS): 0 10*3/uL (ref 0.0–0.1)
IMMATURE GRANULOCYTES: 0 %
LYMPHS: 34 %
Lymphocytes Absolute: 2.6 10*3/uL (ref 0.7–3.1)
MCH: 30 pg (ref 26.6–33.0)
MCHC: 32.8 g/dL (ref 31.5–35.7)
MCV: 92 fL (ref 79–97)
Monocytes Absolute: 0.5 10*3/uL (ref 0.1–0.9)
Monocytes: 7 %
NEUTROS PCT: 57 %
Neutrophils Absolute: 4.4 10*3/uL (ref 1.4–7.0)
PLATELETS: 335 10*3/uL (ref 150–450)
RBC: 4.43 x10E6/uL (ref 4.14–5.80)
RDW: 14.5 % (ref 12.3–15.4)
WBC: 7.7 10*3/uL (ref 3.4–10.8)

## 2017-08-08 LAB — COMPREHENSIVE METABOLIC PANEL
ALT: 30 IU/L (ref 0–44)
AST: 24 IU/L (ref 0–40)
Albumin/Globulin Ratio: 1.6 (ref 1.2–2.2)
Albumin: 4.2 g/dL (ref 3.5–5.5)
Alkaline Phosphatase: 100 IU/L (ref 39–117)
BUN/Creatinine Ratio: 9 (ref 9–20)
BUN: 10 mg/dL (ref 6–24)
Bilirubin Total: 0.4 mg/dL (ref 0.0–1.2)
CALCIUM: 9.2 mg/dL (ref 8.7–10.2)
CHLORIDE: 102 mmol/L (ref 96–106)
CO2: 23 mmol/L (ref 20–29)
Creatinine, Ser: 1.07 mg/dL (ref 0.76–1.27)
GFR, EST AFRICAN AMERICAN: 89 mL/min/{1.73_m2} (ref >59)
GFR, EST NON AFRICAN AMERICAN: 77 mL/min/{1.73_m2} (ref >59)
GLUCOSE: 171 mg/dL — AB (ref 65–99)
Globulin, Total: 2.6 g/dL (ref 1.5–4.5)
Potassium: 4.1 mmol/L (ref 3.5–5.2)
Sodium: 139 mmol/L (ref 134–144)
TOTAL PROTEIN: 6.8 g/dL (ref 6.0–8.5)

## 2017-08-08 LAB — LIPASE: Lipase: 21 U/L (ref 13–78)

## 2017-08-09 ENCOUNTER — Telehealth: Payer: Self-pay | Admitting: *Deleted

## 2017-08-09 LAB — H. PYLORI BREATH TEST: H PYLORI BREATH TEST: NEGATIVE

## 2017-08-09 NOTE — Telephone Encounter (Signed)
-----   Message from [place] Donovan, [place] sent at 08/09/2017  7:50 AM EDT ----- Please call patient. Blood sugar was high and other Labs normal including negative for stomach ulcers.  Thanks, Cuyler Caldron, PA-C

## 2017-08-09 NOTE — Telephone Encounter (Signed)
Patient verified DOB Patient is aware of labs being normal and needing to take medications as prescribed for better control of the DM. Patient aware of needing to apply for GCCN/CAFA for referrals to be completed. Patient will report Monday to clinic for paperwork and appointment. No further questions.

## 2017-09-11 MED FILL — SERTRALINE HCL 50 MG TABS: 50 | 30 days supply | Qty: 30 | Fill #4

## 2017-09-11 MED FILL — $HUMALOG 100 UNITS/ML KWIKP: 100 | 28 days supply | Qty: 3 | Fill #5

## 2017-09-11 MED FILL — LISINOPRIL 10 MG TABS: 10 | 30 days supply | Qty: 30 | Fill #4

## 2017-09-11 MED FILL — AMLODIPINE BESYLATE 5 MG TA: 5 | 30 days supply | Qty: 30 | Fill #5

## 2017-09-11 MED FILL — ATORVASTATIN 10 MG TABLET: 10 | 30 days supply | Qty: 30 | Fill #5

## 2017-09-11 MED FILL — GABAPENTIN 300 MG CAPSULE: 300 | 30 days supply | Qty: 180 | Fill #1

## 2017-09-27 ENCOUNTER — Encounter: Payer: Self-pay | Admitting: Internal Medicine

## 2017-09-27 ENCOUNTER — Ambulatory Visit: Payer: Self-pay | Attending: Internal Medicine | Admitting: Internal Medicine

## 2017-09-27 VITALS — BP 140/86 | HR 96 | Temp 98.4°F | Resp 16 | Wt 214.6 lb

## 2017-09-27 DIAGNOSIS — Z79899 Other long term (current) drug therapy: Secondary | ICD-10-CM | POA: Insufficient documentation

## 2017-09-27 DIAGNOSIS — E538 Deficiency of other specified B group vitamins: Secondary | ICD-10-CM | POA: Insufficient documentation

## 2017-09-27 DIAGNOSIS — Z794 Long term (current) use of insulin: Secondary | ICD-10-CM | POA: Insufficient documentation

## 2017-09-27 DIAGNOSIS — I10 Essential (primary) hypertension: Secondary | ICD-10-CM

## 2017-09-27 DIAGNOSIS — E1143 Type 2 diabetes mellitus with diabetic autonomic (poly)neuropathy: Secondary | ICD-10-CM | POA: Insufficient documentation

## 2017-09-27 DIAGNOSIS — R112 Nausea with vomiting, unspecified: Secondary | ICD-10-CM

## 2017-09-27 DIAGNOSIS — Z7982 Long term (current) use of aspirin: Secondary | ICD-10-CM | POA: Insufficient documentation

## 2017-09-27 DIAGNOSIS — M545 Low back pain: Secondary | ICD-10-CM | POA: Insufficient documentation

## 2017-09-27 DIAGNOSIS — Z23 Encounter for immunization: Secondary | ICD-10-CM

## 2017-09-27 DIAGNOSIS — K3184 Gastroparesis: Secondary | ICD-10-CM

## 2017-09-27 DIAGNOSIS — G8929 Other chronic pain: Secondary | ICD-10-CM | POA: Insufficient documentation

## 2017-09-27 DIAGNOSIS — E1165 Type 2 diabetes mellitus with hyperglycemia: Secondary | ICD-10-CM

## 2017-09-27 DIAGNOSIS — E1142 Type 2 diabetes mellitus with diabetic polyneuropathy: Secondary | ICD-10-CM

## 2017-09-27 DIAGNOSIS — R197 Diarrhea, unspecified: Secondary | ICD-10-CM

## 2017-09-27 DIAGNOSIS — IMO0002 Reserved for concepts with insufficient information to code with codable children: Secondary | ICD-10-CM

## 2017-09-27 DIAGNOSIS — E785 Hyperlipidemia, unspecified: Secondary | ICD-10-CM | POA: Insufficient documentation

## 2017-09-27 DIAGNOSIS — R269 Unspecified abnormalities of gait and mobility: Secondary | ICD-10-CM

## 2017-09-27 LAB — GLUCOSE, POCT (MANUAL RESULT ENTRY): POC Glucose: 260 mg/dl — AB (ref 70–99)

## 2017-09-27 LAB — POCT GLYCOSYLATED HEMOGLOBIN (HGB A1C): HbA1c, POC (controlled diabetic range): 11 % — AB (ref 0.0–7.0)

## 2017-09-27 MED ORDER — INSULIN LISPRO 100 UNIT/ML (KWIKPEN): PEN_INJECTOR | SUBCUTANEOUS | 11 refills | Status: DC

## 2017-09-27 MED ORDER — INSULIN GLARGINE 100 UNIT/ML SOLOSTAR PEN: 36.0000 [IU] | PEN_INJECTOR | Freq: Every day | SUBCUTANEOUS | 3 refills | Status: DC

## 2017-09-27 MED FILL — $LANTUS SOLOSTAR 100 UNITS/: 100 | 25 days supply | Qty: 9 | Fill #0

## 2017-09-27 MED FILL — LISINOPRIL 10 MG TABS: 10 | 30 days supply | Qty: 45 | Fill #0

## 2017-09-27 MED FILL — $HUMALOG 100 UNITS/ML KWIKP: 100 | 28 days supply | Qty: 3 | Fill #0

## 2017-09-27 NOTE — Progress Notes (Signed)
Pt states he is having neuropathy pain in his hands, legs and feets

## 2017-09-27 NOTE — Patient Instructions (Addendum)
Your diabetes is not well controlled.  This can be contributing to some of your gastrointestinal symptoms. Increase Lantus to 36 units at bedtime.  Increase Humalog to 10 units with your 3 largest meals of the day. Avoid eating foods that are high in sugar including sugary drinks.  Consider eating smaller more frequent meals. You have been referred for gastric emptying study and x-ray of the abdomen.  Influenza Virus Vaccine injection (Fluarix) What is this medicine? INFLUENZA VIRUS VACCINE (in floo EN zuh VAHY ruhs vak SEEN) helps to reduce the risk of getting influenza also known as the flu. This medicine may be used for other purposes; ask your health care provider or pharmacist if you have questions. COMMON BRAND NAME(S): Fluarix, Fluzone What should I tell my health care provider before I take this medicine? They need to know if you have any of these conditions: -bleeding disorder like hemophilia -fever or infection -Guillain-Barre syndrome or other neurological problems -immune system problems -infection with the human immunodeficiency virus (HIV) or AIDS -low blood platelet counts -multiple sclerosis -an unusual or allergic reaction to influenza virus vaccine, eggs, chicken proteins, latex, gentamicin, other medicines, foods, dyes or preservatives -pregnant or trying to get pregnant -breast-feeding How should I use this medicine? This vaccine is for injection into a muscle. It is given by a health care professional. A copy of Vaccine Information Statements will be given before each vaccination. Read this sheet carefully each time. The sheet may change frequently. Talk to your pediatrician regarding the use of this medicine in children. Special care may be needed. Overdosage: If you think you have taken too much of this medicine contact a poison control center or emergency room at once. NOTE: This medicine is only for you. Do not share this medicine with others. What if I miss a  dose? This does not apply. What may interact with this medicine? -chemotherapy or radiation therapy -medicines that lower your immune system like etanercept, anakinra, infliximab, and adalimumab -medicines that treat or prevent blood clots like warfarin -phenytoin -steroid medicines like prednisone or cortisone -theophylline -vaccines This list may not describe all possible interactions. Give your health care provider a list of all the medicines, herbs, non-prescription drugs, or dietary supplements you use. Also tell them if you smoke, drink alcohol, or use illegal drugs. Some items may interact with your medicine. What should I watch for while using this medicine? Report any side effects that do not go away within 3 days to your doctor or health care professional. Call your health care provider if any unusual symptoms occur within 6 weeks of receiving this vaccine. You may still catch the flu, but the illness is not usually as bad. You cannot get the flu from the vaccine. The vaccine will not protect against colds or other illnesses that may cause fever. The vaccine is needed every year. What side effects may I notice from receiving this medicine? Side effects that you should report to your doctor or health care professional as soon as possible: -allergic reactions like skin rash, itching or hives, swelling of the face, lips, or tongue Side effects that usually do not require medical attention (report to your doctor or health care professional if they continue or are bothersome): -fever -headache -muscle aches and pains -pain, tenderness, redness, or swelling at site where injected -weak or tired This list may not describe all possible side effects. Call your doctor for medical advice about side effects. You may report side effects to FDA at  1-800-FDA-1088. Where should I keep my medicine? This vaccine is only given in a clinic, pharmacy, doctor's office, or other health care setting and  will not be stored at home. NOTE: This sheet is a summary. It may not cover all possible information. If you have questions about this medicine, talk to your doctor, pharmacist, or health care provider.  2018 Elsevier/Gold Standard (2007-07-30 09:30:40)

## 2017-09-27 NOTE — Progress Notes (Signed)
Patient ID: Ryan Jenkins, male    DOB: 02-Aug-1960  MRN: [id number]  CC: Diabetes and Hypertension   Subjective: Ryan Jenkins is a 57 y.o. male who presents for chronic disease management. His concerns today include:  Pt with hx of DM with neuropathy (in feet and hands), macroalbuminuria, HTN, HL, Vit B12 def chronic LBP  DM: Thinks he has DM gastroparesis.  Has intermittent diarrhea which he describes as "dumping." He would not have a BM for 3-4 days then on the 5th or 6 day "my body would dump."  Would have to sit on toilet for few hrs due to constant diarrhea. Last wk, he woke up and found that he was incontinent of feces. Sometimes he has vomiting at the same time.   Endorses abdominal cramps with these episodes. No blood in stools.  -endorses nausea and bloating after meals daily. H.pylori test neg Symptoms started in June.  -not on Metformin -A1C inc from 8.3 to 11 today.  Does not take his insulin on days of "dumping" -checking BS 2 x a day.  A.m range in 200s and 300s at bedtime.  On Lantus 30 units at bedtime and Humalog 10 units with lunch and dinner.   HTN:  Checks BP once a day.  Range 160s/120. Lisinopril was increased on last visit to 15 mg daily.  However patient states that he was still getting the 10 mg tablets from the pharmacy with old instructions of 1 tablet daily.  So he has not increased it to 15 mg.  Reports compliance with amlodipine.    Still reports intermittent episodes of falls.  Since last visit he reports falling once in the shower.  Referral to physical therapy was resubmitted on his last visit.  However in the interim his orange card/cone discount has expired.  He is in the process of trying to get it reinstated.    Patient Active Problem List   Diagnosis Date Noted  . Macroalbuminuric diabetic nephropathy ([place]) 06/25/2017  . History of falling 06/25/2017  . Hyperlipidemia 03/21/2017  . Vitamin B 12 deficiency 03/21/2017  . Uncontrolled type 2  diabetes mellitus with peripheral neuropathy ([place]) 02/05/2017  . Essential hypertension 02/05/2017  . Depression 02/05/2017  . Unintended weight loss 02/05/2017  . Gait disturbance 02/05/2017  . 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 Diabetic neuropathy, type II diabetes mellitus ([place]) 05/11/2014  . Metatarsal deformity 05/11/2014     Current Outpatient Medications on File Prior to Visit  Medication Sig Dispense Refill  . amLODipine (NORVASC) 5 MG tablet Take 1 tablet (5 mg total) by mouth daily. 90 tablet 3  . aspirin EC 81 MG tablet Take 1 tablet (81 mg total) by mouth daily. 100 tablet 4  . atorvastatin (LIPITOR) 10 MG tablet Take 1 tablet (10 mg total) by mouth daily. 90 tablet 3  . bismuth subsalicylate (PEPTO BISMOL) 262 MG/15ML suspension Take 30 mLs by mouth every 6 (six) hours as needed for indigestion.    . Blood Glucose Monitoring Suppl (TRUE METRIX METER) w/Device KIT Use as directed 1 kit 0  . dapagliflozin propanediol (FARXIGA) 5 MG TABS tablet Take 5 mg by mouth daily. 90 tablet 2  . gabapentin (NEURONTIN) 300 MG capsule Take 2 capsules (600 mg total) by mouth 3 (three) times daily. 180 capsule 6  . glucose blood (TRUE METRIX BLOOD GLUCOSE TEST) test strip Use as instructed 100 each 12  . Insulin Pen Needle (PEN NEEDLES) 31G X 8 MM MISC  Use as directed 100 each 6  . lisinopril (PRINIVIL,ZESTRIL) 10 MG tablet Take 1.5 tablets (15 mg total) by mouth daily. 135 tablet 2  . ondansetron (ZOFRAN) 4 MG tablet Take 1 tablet (4 mg total) by mouth every 6 (six) hours. 12 tablet 0  . pantoprazole (PROTONIX) 20 MG tablet Take 1 tablet (20 mg total) by mouth daily. 10 tablet 0  . pneumococcal 23 valent vaccine (PNEUMOVAX 23) 25 MCG/0.5ML injection TO BE ADMINISTERED BY THE PHARMACIST (Patient not taking: Reported on 08/07/2017) 0.5 mL 0  . sertraline (ZOLOFT) 50 MG tablet Take 1 tablet (50 mg total) by mouth daily. 1/2 tab daily x 2 wks then 1 tab daily PO 30 tablet 6  . TRUEPLUS  LANCETS 28G MISC Use as directed 100 each 1  . vitamin B-12 (CYANOCOBALAMIN) 1000 MCG tablet Take 1 tablet (1,000 mcg total) by mouth daily. 100 tablet 3   No current facility-administered medications on file prior to visit.     Allergies  Allergen Reactions  . Claritin [Loratadine] Swelling    Joint swelling   . Lyrica [Pregabalin]     depression    Social History   Socioeconomic History  . Marital status: Single    Spouse name: Not on file  . Number of children: Not on file  . Years of education: 50  . Highest education level: Not on file  Occupational History  . 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 Financial resource strain: Not on file  . Food insecurity:    Worry: Not on file    Inability: Not on file  . Transportation needs:    Medical: Not on file    Non-medical: Not on file  Tobacco Use  . Smoking status: Never Smoker  . Smokeless tobacco: Never Used  Substance and Sexual Activity  . Alcohol use: No    Alcohol/week: 0.0 standard drinks  . Drug use: No  . Sexual activity: Yes  Lifestyle  . Physical activity:    Days per week: Not on file    Minutes per session: Not on file  . Stress: Not on file  Relationships  . Social connections:    Talks on phone: Not on file    Gets together: Not on file    Attends religious service: Not on file    Active member of club or organization: Not on file    Attends meetings of clubs or organizations: Not on file    Relationship status: Not on file  . Intimate partner violence:    Fear of current or ex partner: Not on file    Emotionally abused: Not on file    Physically abused: Not on file    Forced sexual activity: Not on file  Other Topics Concern  . Not on file  Social History Narrative  . Not on file    Family History  Problem Relation Age of Onset  . Renal cancer Mother   . Hypertension Mother   . Hypertension Sister   . Leukemia Maternal Uncle     Past Surgical  History:  Procedure Laterality Date  . CATARACT EXTRACTION Bilateral 2017    ROS: Review of Systems  PHYSICAL EXAM: BP 140/86   Pulse 96   Temp 98.4 F (36.9 C) (Oral)   Resp 16   Wt 214 lb 9.6 oz (97.3 kg)   SpO2 100%   BMI 27.55 kg/m   Wt Readings from Last 3 Encounters:  09/27/17 214 lb 9.6 oz (97.3 kg)  08/07/17 209 lb (94.8 kg)  06/25/17 209 lb 9.6 oz (95.1 kg)    Physical Exam  General appearance - alert, well appearing, and in no distress Mental status - normal mood, behavior, speech, dress, motor activity, and thought processes Chest - clear to auscultation, no wheezes, rales or rhonchi, symmetric air entry Heart - normal rate, regular rhythm, normal S1, S2, no murmurs, rubs, clicks or gallops Abdomen - soft, nontender, nondistended, no masses or organomegaly Rectal - negative without mass, lesions or tenderness.  No fecal impaction appreciated.  Hemoccult was negative. Extremities -trace lower extremity edema.  Results for orders placed or performed in visit on 09/27/17  POCT glucose (manual entry)  Result Value Ref Range   POC Glucose 260 (A) 70 - 99 mg/dl  POCT glycosylated hemoglobin (Hb A1C)  Result Value Ref Range   Hemoglobin A1C     HbA1c POC (<> result, manual entry)     HbA1c, POC (prediabetic range)     HbA1c, POC (controlled diabetic range) 11.0 (A) 0.0 - 7.0 %    ASSESSMENT AND PLAN: 1. Uncontrolled type 2 diabetes mellitus with peripheral neuropathy [facility]) Discussed with patient's his current symptoms may likely represent diabetic enteropathy with gastroparesis.  Stressed the importance of good diabetes control.  Avoid eating foods with high sugar content.  Try eating smaller more frequent meals. Increase Lantus to 36 units daily. Increase Humalog to 10 units with his 3 largest meals of the day. Continue to monitor blood sugars and bring in readings on next visit. - POCT glucose (manual entry) - POCT glycosylated hemoglobin (Hb A1C) - insulin  lispro (HUMALOG KWIKPEN) 100 UNIT/ML KiwkPen; 10 units subcut with meals  Dispense: 15 mL; Refill: 11 - Insulin Glargine (LANTUS SOLOSTAR) 100 UNIT/ML Solostar Pen; Inject 36 Units into the skin daily at 10 pm.  Dispense: 5 pen; Refill: 3  2. Nausea and vomiting, intractability of vomiting not specified, unspecified vomiting type 3. Gastroparesis See discussion under #1 above.  We will hold off on prescribing Reglan until we get the results of the gastric emptying study. - NM Gastric Emptying; Future  4. Intermittent diarrhea See discussion under #1 above.  Will refer to GI once he gets the orange card/cone discount reinstated. - DG Abd 2 Views; Future  5. Essential hypertension Not at goal.  Increase lisinopril to 15 mg as intended.  I have spoken with the pharmacy about the prescription. I wanted to check urine microalbumin/creatinine ratio today to see whether the microalbuminurea has improved but patient was unable to produce urine.  We will recheck on his next visit 6. Need for immunization against influenza - Flu Vaccine QUAD 36+ mos IM  7. Gait disturbance Message sent to our referral coordinator about his referral to physical therapy.  However I do not think any movement will be done on this until he gets the orange card/cone discount reinstated.  Patient was given the opportunity to ask questions.  Patient verbalized understanding of the plan and was able to repeat key elements of the plan.   Orders Placed This Encounter  Procedures  . DG Abd 2 Views  . NM Gastric Emptying  . Flu Vaccine QUAD 36+ mos IM  . POCT glucose (manual entry)  . POCT glycosylated hemoglobin (Hb A1C)     Requested Prescriptions   Signed Prescriptions Disp Refills  . insulin lispro (HUMALOG KWIKPEN) 100 UNIT/ML KiwkPen 15 mL 11    Sig: 10 units subcut  with meals  . Insulin Glargine (LANTUS SOLOSTAR) 100 UNIT/ML Solostar Pen 5 pen 3    Sig: Inject 36 Units into the skin daily at 10 pm.     Return in about 4 weeks (around 10/25/2017).  Karle Plumber, MD, FACP

## 2017-10-03 ENCOUNTER — Emergency Department (HOSPITAL_COMMUNITY)
Admission: EM | Admit: 2017-10-03 | Discharge: 2017-10-03 | Disposition: A | Discharge status: Home / Self Care | Payer: No Typology Code available for payment source | Attending: Emergency Medicine | Admitting: Emergency Medicine

## 2017-10-03 ENCOUNTER — Other Ambulatory Visit: Payer: Self-pay

## 2017-10-03 ENCOUNTER — Encounter (HOSPITAL_COMMUNITY): Payer: Self-pay

## 2017-10-03 ENCOUNTER — Encounter (HOSPITAL_COMMUNITY)
Admission: RE | Admit: 2017-10-03 | Discharge: 2017-10-03 | Disposition: A | Discharge status: Home / Self Care | Payer: No Typology Code available for payment source | Source: Ambulatory Visit | Attending: Internal Medicine | Admitting: Internal Medicine

## 2017-10-03 DIAGNOSIS — Y9389 Activity, other specified: Secondary | ICD-10-CM | POA: Insufficient documentation

## 2017-10-03 DIAGNOSIS — Z794 Long term (current) use of insulin: Secondary | ICD-10-CM | POA: Insufficient documentation

## 2017-10-03 DIAGNOSIS — Z79899 Other long term (current) drug therapy: Secondary | ICD-10-CM | POA: Diagnosis not present

## 2017-10-03 DIAGNOSIS — M542 Cervicalgia: Secondary | ICD-10-CM | POA: Diagnosis present

## 2017-10-03 DIAGNOSIS — E1140 Type 2 diabetes mellitus with diabetic neuropathy, unspecified: Secondary | ICD-10-CM | POA: Insufficient documentation

## 2017-10-03 DIAGNOSIS — E119 Type 2 diabetes mellitus without complications: Secondary | ICD-10-CM | POA: Diagnosis not present

## 2017-10-03 DIAGNOSIS — I10 Essential (primary) hypertension: Secondary | ICD-10-CM | POA: Diagnosis not present

## 2017-10-03 DIAGNOSIS — Z7982 Long term (current) use of aspirin: Secondary | ICD-10-CM | POA: Diagnosis not present

## 2017-10-03 DIAGNOSIS — K3184 Gastroparesis: Secondary | ICD-10-CM | POA: Insufficient documentation

## 2017-10-03 DIAGNOSIS — Y999 Unspecified external cause status: Secondary | ICD-10-CM | POA: Diagnosis not present

## 2017-10-03 DIAGNOSIS — R51 Headache: Secondary | ICD-10-CM | POA: Insufficient documentation

## 2017-10-03 DIAGNOSIS — Y92410 Unspecified street and highway as the place of occurrence of the external cause: Secondary | ICD-10-CM | POA: Diagnosis not present

## 2017-10-03 DIAGNOSIS — M7918 Myalgia, other site: Secondary | ICD-10-CM

## 2017-10-03 MED ORDER — TECHNETIUM TC 99M SULFUR COLLOID
2.0000 | Freq: Once | INTRAVENOUS | Status: AC | PRN
  Administered 2017-10-03: 2 via ORAL

## 2017-10-03 MED ORDER — METHOCARBAMOL 500 MG PO TABS: 500.0000 mg | ORAL_TABLET | Freq: Two times a day (BID) | ORAL | 0 refills | Status: DC

## 2017-10-03 MED FILL — METHOCARBAMOL 500 MG TABS: 500 | 10 days supply | Qty: 20 | Fill #0

## 2017-10-03 NOTE — ED Notes (Signed)
Pt verbalized understanding of discharge paperwork.

## 2017-10-03 NOTE — Discharge Instructions (Addendum)
Please read attached information. If you experience any new or worsening signs or symptoms please return to the emergency room for evaluation. Please follow-up with your primary care provider or specialist as discussed. Please use medication prescribed only as directed and discontinue taking if you have any concerning signs or symptoms.   °
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
## 2017-10-04 ENCOUNTER — Other Ambulatory Visit: Payer: Self-pay | Admitting: Internal Medicine

## 2017-10-04 MED ORDER — ONDANSETRON HCL 4 MG PO TABS: 4.0000 mg | ORAL_TABLET | Freq: Three times a day (TID) | ORAL | 1 refills | Status: DC | PRN

## 2017-10-04 MED FILL — ONDANSETRON HCL 4 MG TABLET: 4 | 7 days supply | Qty: 20 | Fill #0

## 2017-10-07 ENCOUNTER — Telehealth: Payer: Self-pay

## 2017-10-07 NOTE — Telephone Encounter (Signed)
Contacted pt to go results pt didn't answer left a detailed vm informing pt of results and if he has any questions or concerns to give me a call  If pt calls back please give results: gastric emptying study was normal. I have sent a prescription to the pharmacy for Zofran to use as needed for nausea and vomiting until I see him in follow-up

## 2017-10-28 MED FILL — GABAPENTIN 300 MG CAPSULE: 300 | 30 days supply | Qty: 180 | Fill #2

## 2017-10-28 MED FILL — $HUMALOG 100 UNITS/ML KWIKP: 100 | 28 days supply | Qty: 3 | Fill #1

## 2017-11-04 ENCOUNTER — Ambulatory Visit: Payer: Self-pay | Attending: Internal Medicine | Admitting: Internal Medicine

## 2017-11-04 ENCOUNTER — Encounter: Payer: Self-pay | Admitting: Internal Medicine

## 2017-11-04 VITALS — BP 162/77 | HR 85 | Temp 98.5°F | Resp 16 | Wt 221.6 lb

## 2017-11-04 DIAGNOSIS — Z888 Allergy status to other drugs, medicaments and biological substances status: Secondary | ICD-10-CM | POA: Insufficient documentation

## 2017-11-04 DIAGNOSIS — E1143 Type 2 diabetes mellitus with diabetic autonomic (poly)neuropathy: Secondary | ICD-10-CM | POA: Insufficient documentation

## 2017-11-04 DIAGNOSIS — IMO0002 Reserved for concepts with insufficient information to code with codable children: Secondary | ICD-10-CM

## 2017-11-04 DIAGNOSIS — R197 Diarrhea, unspecified: Secondary | ICD-10-CM | POA: Insufficient documentation

## 2017-11-04 DIAGNOSIS — E785 Hyperlipidemia, unspecified: Secondary | ICD-10-CM | POA: Insufficient documentation

## 2017-11-04 DIAGNOSIS — E538 Deficiency of other specified B group vitamins: Secondary | ICD-10-CM | POA: Insufficient documentation

## 2017-11-04 DIAGNOSIS — N529 Male erectile dysfunction, unspecified: Secondary | ICD-10-CM | POA: Insufficient documentation

## 2017-11-04 DIAGNOSIS — Z79899 Other long term (current) drug therapy: Secondary | ICD-10-CM | POA: Insufficient documentation

## 2017-11-04 DIAGNOSIS — Z8249 Family history of ischemic heart disease and other diseases of the circulatory system: Secondary | ICD-10-CM | POA: Insufficient documentation

## 2017-11-04 DIAGNOSIS — F329 Major depressive disorder, single episode, unspecified: Secondary | ICD-10-CM | POA: Insufficient documentation

## 2017-11-04 DIAGNOSIS — Z7982 Long term (current) use of aspirin: Secondary | ICD-10-CM | POA: Insufficient documentation

## 2017-11-04 DIAGNOSIS — R269 Unspecified abnormalities of gait and mobility: Secondary | ICD-10-CM | POA: Insufficient documentation

## 2017-11-04 DIAGNOSIS — K3184 Gastroparesis: Secondary | ICD-10-CM | POA: Insufficient documentation

## 2017-11-04 DIAGNOSIS — E1165 Type 2 diabetes mellitus with hyperglycemia: Secondary | ICD-10-CM | POA: Insufficient documentation

## 2017-11-04 DIAGNOSIS — I10 Essential (primary) hypertension: Secondary | ICD-10-CM | POA: Insufficient documentation

## 2017-11-04 DIAGNOSIS — Z809 Family history of malignant neoplasm, unspecified: Secondary | ICD-10-CM | POA: Insufficient documentation

## 2017-11-04 DIAGNOSIS — E1121 Type 2 diabetes mellitus with diabetic nephropathy: Secondary | ICD-10-CM | POA: Insufficient documentation

## 2017-11-04 DIAGNOSIS — E1142 Type 2 diabetes mellitus with diabetic polyneuropathy: Secondary | ICD-10-CM | POA: Insufficient documentation

## 2017-11-04 DIAGNOSIS — Z794 Long term (current) use of insulin: Secondary | ICD-10-CM | POA: Insufficient documentation

## 2017-11-04 LAB — GLUCOSE, POCT (MANUAL RESULT ENTRY): POC Glucose: 181 mg/dl — AB (ref 70–99)

## 2017-11-04 MED ORDER — AMLODIPINE BESYLATE 10 MG PO TABS: 10.0000 mg | ORAL_TABLET | Freq: Every day | ORAL | 6 refills | Status: DC

## 2017-11-04 MED ORDER — SILDENAFIL CITRATE 100 MG PO TABS: ORAL_TABLET | ORAL | 2 refills | Status: DC

## 2017-11-04 MED ORDER — INSULIN GLARGINE 100 UNIT/ML SOLOSTAR PEN: 38.0000 [IU] | PEN_INJECTOR | Freq: Every day | SUBCUTANEOUS | 3 refills | Status: DC

## 2017-11-04 MED FILL — AMLODIPINE BESYLATE 10 MG T: 10 | 30 days supply | Qty: 30 | Fill #0

## 2017-11-04 NOTE — Progress Notes (Signed)
Patient ID: [id number] BUCKLIN, male    DOB: 10-26-1960  MRN: [id number]  CC: Diabetes and Hypertension   Subjective: Ryan Jenkins is a 57 y.o. male who presents for 1 mth f/u chronic disease management His concerns today include:  Pt with hx of DM with neuropathy (in feet and hands), macroalbuminuria,HTN, HL, Vit B12 def chronic LBP  Did not reapply for the [facility] discount card as yet.  His gastric emptying study was nl Using Imodium, Pepto Bismol and a detox tea intermittently.. Diarrhea has been less frequent since last visit.  States that it now occurs about every 3 days and not as explosive plosive.   Had c-scope done 2 yrs ago with [facility].  Had 5 polyps removed.  States he was told that he needed repeat in 10 years.  He will try to get a hold of this report for our record.  HTN:  Taking Lisnopril 15 mg and Norvasc daily as prescribed.  Checks BP Q a.m and reports that has been running high. Did not take meds as yet for a.m  DM: checking BS BID.  Before BF 97-150 and bedtime time around 200.  Did not eat as yet for a.m. reports compliance with Lantus and NovoLog. Not sure if he is taking [place] he will check to see if he has the bottle when he returns home.  Complains of inability to get an erection for over 1 year.  Gives history of ED for several years.  He was on Cialis and Viagra several years ago but states that they did not help.  Over the past year he has found that he is unable to get an erection at all even with masturbation.  Endorses sexual arousal mentally but not able to achieve erection.  He requests to have his testosterone level checked  Patient Active Problem List   Diagnosis Date Noted  . Intermittent diarrhea 09/27/2017  . Gastroparesis 09/27/2017  . Macroalbuminuric diabetic nephropathy ([place]) 06/25/2017  . History of falling 06/25/2017  . Hyperlipidemia 03/21/2017  . Vitamin B 12 deficiency 03/21/2017  . Uncontrolled type 2 diabetes mellitus with  peripheral neuropathy ([place]) 02/05/2017  . Essential hypertension 02/05/2017  . Depression 02/05/2017  . Unintended weight loss 02/05/2017  . Gait disturbance 02/05/2017  . 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 Diabetic neuropathy, type II diabetes mellitus ([place]) 05/11/2014  . Metatarsal deformity 05/11/2014     Current Outpatient Medications on File Prior to Visit  Medication Sig Dispense Refill  . aspirin EC 81 MG tablet Take 1 tablet (81 mg total) by mouth daily. 100 tablet 4  . atorvastatin (LIPITOR) 10 MG tablet Take 1 tablet (10 mg total) by mouth daily. 90 tablet 3  . bismuth subsalicylate (PEPTO BISMOL) 262 MG/15ML suspension Take 30 mLs by mouth every 6 (six) hours as needed for indigestion.    . Blood Glucose Monitoring Suppl (TRUE METRIX METER) w/Device KIT Use as directed 1 kit 0  . dapagliflozin propanediol (FARXIGA) 5 MG TABS tablet Take 5 mg by mouth daily. (Patient not taking: Reported on 11/04/2017) 90 tablet 2  . gabapentin (NEURONTIN) 300 MG capsule Take 2 capsules (600 mg total) by mouth 3 (three) times daily. 180 capsule 6  . glucose blood (TRUE METRIX BLOOD GLUCOSE TEST) test strip Use as instructed 100 each 12  . insulin lispro (HUMALOG KWIKPEN) 100 UNIT/ML KiwkPen 10 units subcut with meals 15 mL 11  . Insulin Pen Needle (PEN NEEDLES) 31G X 8 MM  MISC Use as directed 100 each 6  . lisinopril (PRINIVIL,ZESTRIL) 10 MG tablet Take 1.5 tablets (15 mg total) by mouth daily. 135 tablet 2  . pantoprazole (PROTONIX) 20 MG tablet Take 1 tablet (20 mg total) by mouth daily. 10 tablet 0  . pneumococcal 23 valent vaccine (PNEUMOVAX 23) 25 MCG/0.5ML injection TO BE ADMINISTERED BY THE PHARMACIST (Patient not taking: Reported on 08/07/2017) 0.5 mL 0  . sertraline (ZOLOFT) 50 MG tablet Take 1 tablet (50 mg total) by mouth daily. 1/2 tab daily x 2 wks then 1 tab daily PO 30 tablet 6  . TRUEPLUS LANCETS 28G MISC Use as directed 100 each 1  . vitamin B-12 (CYANOCOBALAMIN) 1000  MCG tablet Take 1 tablet (1,000 mcg total) by mouth daily. 100 tablet 3   No current facility-administered medications on file prior to visit.     Allergies  Allergen Reactions  . Claritin [Loratadine] Swelling    Joint swelling   . Lyrica [Pregabalin]     depression    Social History   Socioeconomic History  . Marital status: Single    Spouse name: Not on file  . Number of children: Not on file  . Years of education: 28  . Highest education level: Not on file  Occupational History  . 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 Financial resource strain: Not on file  . Food insecurity:    Worry: Not on file    Inability: Not on file  . Transportation needs:    Medical: Not on file    Non-medical: Not on file  Tobacco Use  . Smoking status: Never Smoker  . Smokeless tobacco: Never Used  Substance and Sexual Activity  . Alcohol use: No    Alcohol/week: 0.0 standard drinks  . Drug use: No  . Sexual activity: Yes  Lifestyle  . Physical activity:    Days per week: Not on file    Minutes per session: Not on file  . Stress: Not on file  Relationships  . Social connections:    Talks on phone: Not on file    Gets together: Not on file    Attends religious service: Not on file    Active member of club or organization: Not on file    Attends meetings of clubs or organizations: Not on file    Relationship status: Not on file  . Intimate partner violence:    Fear of current or ex partner: Not on file    Emotionally abused: Not on file    Physically abused: Not on file    Forced sexual activity: Not on file  Other Topics Concern  . Not on file  Social History Narrative  . Not on file    Family History  Problem Relation Age of Onset  . Renal cancer Mother   . Hypertension Mother   . Hypertension Sister   . Leukemia Maternal Uncle     Past Surgical History:  Procedure Laterality Date  . CATARACT EXTRACTION Bilateral 2017     ROS: Review of Systems Negative except as above PHYSICAL EXAM: BP (!) 162/77 (BP Location: Right Arm, Cuff Size: Normal) Comment: recheck  Pulse 85   Temp 98.5 F (36.9 C) (Oral)   Resp 16   Wt 221 lb 9.6 oz (100.5 kg)   SpO2 100%   BMI 28.45 kg/m   Physical Exam  General appearance - alert, well appearing, and in no distress Mental status -  normal mood, behavior, speech, dress, motor activity, and thought processes Chest - clear to auscultation, no wheezes, rales or rhonchi, symmetric air entry Heart - normal rate, regular rhythm, normal S1, S2, no murmurs, rubs, clicks or gallops Extremities - peripheral pulses normal, no pedal edema, no clubbing or cyanosis  Results for orders placed or performed in visit on 11/04/17  POCT glucose (manual entry)  Result Value Ref Range   POC Glucose 181 (A) 70 - 99 mg/dl    ASSESSMENT AND PLAN: 1. Uncontrolled type 2 diabetes mellitus with peripheral neuropathy ([facility]) Morning blood sugar not at goal.  I recommend increasing Lantus from 36 units to 38 units.  Encourage healthy eating habits - POCT glucose (manual entry) - Insulin Glargine (LANTUS SOLOSTAR) 100 UNIT/ML Solostar Pen; Inject 38 Units into the skin daily at 10 pm.  Dispense: 5 pen; Refill: 3  2. Essential hypertension Not at goal.  Patient to take his medicines when he returns home.  Continue to limit salt in the foods. - amLODipine (NORVASC) 10 MG tablet; Take 1 tablet (10 mg total) by mouth daily.  Dispense: 30 tablet; Refill: 6  3. Impotence of organic origin We discussed that diabetes on blood pressure can cause and contribute to ED and/or impotence. We discussed trying him with Viagra again. Pt advised of possible side effects of this medication including prolong erection, flushing, headaches, stuff nose and sudden vision and hearing changes.  Pt told to be seen in ER if he has erection lasting longer than 3-4 hrs, or if he has sudden vision changes or hearing loss. -  sildenafil (VIAGRA) 100 MG tablet; 1/2 to 1 tab 30 mins prior to sex.  Limit use to 1 tab Q 24 hrs  Dispense: 5 tablet; Refill: 2 - Testosterone; Future  4. Diarrhea, unspecified type Frequency has decreased.  We will still refer him to gastroenterology once he is approved for the [facility] card[facility] discount. Gastric emptying study was normal.   Patient was given the opportunity to ask questions.  Patient verbalized understanding of the plan and was able to repeat key elements of the plan.   Orders Placed This Encounter  Procedures  . Testosterone  . POCT glucose (manual entry)     Requested Prescriptions   Signed Prescriptions Disp Refills  . amLODipine (NORVASC) 10 MG tablet 30 tablet 6    Sig: Take 1 tablet (10 mg total) by mouth daily.  . Insulin Glargine (LANTUS SOLOSTAR) 100 UNIT/ML Solostar Pen 5 pen 3    Sig: Inject 38 Units into the skin daily at 10 pm.  . sildenafil (VIAGRA) 100 MG tablet 5 tablet 2    Sig: 1/2 to 1 tab 30 mins prior to sex.  Limit use to 1 tab Q 24 hrs    Return in about 2 months (around 01/04/2018).  Karle Plumber, MD, FACP

## 2017-11-04 NOTE — Patient Instructions (Signed)
Increase Lantus to 38 units daily.  Please check and see whether you have the diabetes medicine called Wilder Glade.  Your blood pressure is not well controlled.  Increase amlodipine to 10 mg daily.  Continue to limit salt in the foods.  Once you have applied and is approved for the [facility] card/[facility] discount card, please let me know so that we can refer you to the gastroenterologist.

## 2017-11-05 MED FILL — SILDENAFIL CITRATE 100 MG T: 100 | 30 days supply | Qty: 10 | Fill #0

## 2017-11-05 MED FILL — $LANTUS SOLOSTAR 100 UNITS/: 100 | 31 days supply | Qty: 12 | Fill #0

## 2017-11-11 ENCOUNTER — Other Ambulatory Visit: Payer: Self-pay

## 2017-11-12 ENCOUNTER — Other Ambulatory Visit: Payer: Self-pay

## 2017-11-27 MED FILL — $HUMALOG 100 UNITS/ML KWIKP: 100 | 28 days supply | Qty: 3 | Fill #6

## 2017-12-31 ENCOUNTER — Telehealth: Payer: Self-pay | Admitting: Internal Medicine

## 2017-12-31 NOTE — Telephone Encounter (Signed)
Pt called left LV, call back I LVM returning his call

## 2018-01-03 ENCOUNTER — Ambulatory Visit: Payer: Self-pay | Attending: Internal Medicine | Admitting: Internal Medicine

## 2018-01-03 ENCOUNTER — Encounter: Payer: Self-pay | Admitting: Internal Medicine

## 2018-01-03 VITALS — BP 171/98 | HR 94 | Temp 98.4°F | Resp 16 | Wt 229.6 lb

## 2018-01-03 DIAGNOSIS — Z79899 Other long term (current) drug therapy: Secondary | ICD-10-CM | POA: Insufficient documentation

## 2018-01-03 DIAGNOSIS — Z794 Long term (current) use of insulin: Secondary | ICD-10-CM | POA: Insufficient documentation

## 2018-01-03 DIAGNOSIS — E1142 Type 2 diabetes mellitus with diabetic polyneuropathy: Secondary | ICD-10-CM | POA: Insufficient documentation

## 2018-01-03 DIAGNOSIS — M545 Low back pain: Secondary | ICD-10-CM | POA: Insufficient documentation

## 2018-01-03 DIAGNOSIS — N529 Male erectile dysfunction, unspecified: Secondary | ICD-10-CM | POA: Insufficient documentation

## 2018-01-03 DIAGNOSIS — E785 Hyperlipidemia, unspecified: Secondary | ICD-10-CM | POA: Insufficient documentation

## 2018-01-03 DIAGNOSIS — Z888 Allergy status to other drugs, medicaments and biological substances status: Secondary | ICD-10-CM | POA: Insufficient documentation

## 2018-01-03 DIAGNOSIS — F329 Major depressive disorder, single episode, unspecified: Secondary | ICD-10-CM | POA: Insufficient documentation

## 2018-01-03 DIAGNOSIS — K3184 Gastroparesis: Secondary | ICD-10-CM | POA: Insufficient documentation

## 2018-01-03 DIAGNOSIS — I10 Essential (primary) hypertension: Secondary | ICD-10-CM | POA: Insufficient documentation

## 2018-01-03 DIAGNOSIS — IMO0002 Reserved for concepts with insufficient information to code with codable children: Secondary | ICD-10-CM

## 2018-01-03 DIAGNOSIS — E11319 Type 2 diabetes mellitus with unspecified diabetic retinopathy without macular edema: Secondary | ICD-10-CM | POA: Insufficient documentation

## 2018-01-03 DIAGNOSIS — E1165 Type 2 diabetes mellitus with hyperglycemia: Secondary | ICD-10-CM

## 2018-01-03 DIAGNOSIS — E1143 Type 2 diabetes mellitus with diabetic autonomic (poly)neuropathy: Secondary | ICD-10-CM | POA: Insufficient documentation

## 2018-01-03 DIAGNOSIS — Z8051 Family history of malignant neoplasm of kidney: Secondary | ICD-10-CM | POA: Insufficient documentation

## 2018-01-03 DIAGNOSIS — E1121 Type 2 diabetes mellitus with diabetic nephropathy: Secondary | ICD-10-CM | POA: Insufficient documentation

## 2018-01-03 DIAGNOSIS — Z8249 Family history of ischemic heart disease and other diseases of the circulatory system: Secondary | ICD-10-CM | POA: Insufficient documentation

## 2018-01-03 DIAGNOSIS — E538 Deficiency of other specified B group vitamins: Secondary | ICD-10-CM | POA: Insufficient documentation

## 2018-01-03 DIAGNOSIS — Z7982 Long term (current) use of aspirin: Secondary | ICD-10-CM | POA: Insufficient documentation

## 2018-01-03 LAB — POCT GLYCOSYLATED HEMOGLOBIN (HGB A1C): HBA1C, POC (CONTROLLED DIABETIC RANGE): 10.3 % — AB (ref 0.0–7.0)

## 2018-01-03 LAB — GLUCOSE, POCT (MANUAL RESULT ENTRY): POC GLUCOSE: 92 mg/dL (ref 70–99)

## 2018-01-03 MED ORDER — INSULIN GLARGINE 100 UNIT/ML SOLOSTAR PEN: 42.0000 [IU] | PEN_INJECTOR | Freq: Every day | SUBCUTANEOUS | 3 refills | Status: DC

## 2018-01-03 MED ORDER — HYDROCHLOROTHIAZIDE 25 MG PO TABS: 25.0000 mg | ORAL_TABLET | Freq: Every day | ORAL | 3 refills | Status: DC

## 2018-01-03 MED ORDER — DAPAGLIFLOZIN PROPANEDIOL 5 MG PO TABS: 10.0000 mg | ORAL_TABLET | Freq: Every day | ORAL | 2 refills | Status: DC

## 2018-01-03 MED ORDER — TADALAFIL 10 MG PO TABS: 10.0000 mg | ORAL_TABLET | Freq: Every day | ORAL | 0 refills | Status: DC | PRN

## 2018-01-03 NOTE — Patient Instructions (Addendum)
Please give patient an appointment with Lurena Joiner, our clinical pharmacist for repeat blood pressure check in 1 month.  Your blood pressure is uncontrolled.  Goal is 130/80 or lower.  We have added a new medication called hydrochlorothiazide for you to take once a day.  Continue to limit the salt in the foods.  After you have been on hydrochlorothiazide for 1 week, please return to the laboratory to have blood drawn to check your kidney function and potassium level.  Increase Lantus to 42 units once a day. You have been referred to an ophthalmologist for further evaluation of the diabetic retinopathy.  When you return to the laboratory we will check your testosterone level.

## 2018-01-03 NOTE — Progress Notes (Signed)
Pt states he is also having pain in his b/l legs and lower back

## 2018-01-03 NOTE — Progress Notes (Signed)
Patient ID: Ryan Jenkins, male    DOB: 1960-01-17  MRN: [id number]  CC: Hypertension and Diabetes   Subjective: Ryan Jenkins is a 57 y.o. male who presents for chronic ds management His concerns today include:  Pt with hx of DM with neuropathy (in feet and hands), macroalbuminuria,HTN, HL, Vit B12 def chronic LBP  ED:  Unable to get erection with 100 mg of Viagra.  He forgot to return to the lab to have testosterone level drawn  DM: checking BS BID.  He reports that the blood sugars are still high but have been coming down.  Fasting BS in the 150-210s and bedtime BS in the 120-200. Meds: He takes the Lantus consistently.  Humalog he takes 2-3 times a day with meals.  A lot of times he does not have his pen with him if he is away from the home during lunch.  Supposed to be on Farxiga 5 mg but patient states he has been taking 2 of the 5 mg.  He thought that we had increased it to 10 mg.  Farxiga 10 mg daily Saw Dr. Dorcas Carrow, optometrist, 3 wks ago.  Dx with DM retinopathy with "bleeding behind the eyes."  Referred to a retinal specialist but he cannot afford to see 1 Neuropathy in hands and feet worse.  Feels he will not be able to work consistently anymore.  He has applied for disability.  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  He reports that both systolic and diastolic blood pressures have been high up into the 170/110.  Limits salt in foods  Using, Kefer, a Probiotic drink.  It is help dec vomiting and diarrhea.   Patient Active Problem List   Diagnosis Date Noted  . Intermittent diarrhea 09/27/2017  . Gastroparesis 09/27/2017  . Macroalbuminuric diabetic nephropathy ([place]) 06/25/2017  . History of falling 06/25/2017  . Hyperlipidemia 03/21/2017  . Vitamin B 12 deficiency 03/21/2017  . Uncontrolled type 2 diabetes mellitus with peripheral neuropathy ([place]) 02/05/2017  . Essential hypertension 02/05/2017    . Depression 02/05/2017  . Unintended weight loss 02/05/2017  . Gait disturbance 02/05/2017  . 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 Diabetic neuropathy, type II diabetes mellitus ([place]) 05/11/2014  . Metatarsal deformity 05/11/2014     Current Outpatient Medications on File Prior to Visit  Medication Sig Dispense Refill  . amLODipine (NORVASC) 10 MG tablet Take 1 tablet (10 mg total) by mouth daily. 30 tablet 6  . aspirin EC 81 MG tablet Take 1 tablet (81 mg total) by mouth daily. 100 tablet 4  . atorvastatin (LIPITOR) 10 MG tablet Take 1 tablet (10 mg total) by mouth daily. 90 tablet 3  . bismuth subsalicylate (PEPTO BISMOL) 262 MG/15ML suspension Take 30 mLs by mouth every 6 (six) hours as needed for indigestion.    . Blood Glucose Monitoring Suppl (TRUE METRIX METER) w/Device KIT Use as directed 1 kit 0  . gabapentin (NEURONTIN) 300 MG capsule Take 2 capsules (600 mg total) by mouth 3 (three) times daily. 180 capsule 6  . glucose blood (TRUE METRIX BLOOD GLUCOSE TEST) test strip Use as instructed 100 each 12  . insulin lispro (HUMALOG KWIKPEN) 100 UNIT/ML KiwkPen 10 units subcut with meals 15 mL 11  . Insulin Pen Needle (PEN NEEDLES) 31G X 8 MM MISC Use as directed 100 each 6  . lisinopril (  PRINIVIL,ZESTRIL) 10 MG tablet Take 1.5 tablets (15 mg total) by mouth daily. 135 tablet 2  . pantoprazole (PROTONIX) 20 MG tablet Take 1 tablet (20 mg total) by mouth daily. (Patient not taking: Reported on 01/03/2018) 10 tablet 0  . sertraline (ZOLOFT) 50 MG tablet Take 1 tablet (50 mg total) by mouth daily. 1/2 tab daily x 2 wks then 1 tab daily PO 30 tablet 6  . TRUEPLUS LANCETS 28G MISC Use as directed 100 each 1  . vitamin B-12 (CYANOCOBALAMIN) 1000 MCG tablet Take 1 tablet (1,000 mcg total) by mouth daily. 100 tablet 3   No current facility-administered medications on file prior to visit.     Allergies  Allergen Reactions  . Claritin [Loratadine] Swelling    Joint swelling   .  Lyrica [Pregabalin]     depression    Social History   Socioeconomic History  . Marital status: Single    Spouse name: Not on file  . Number of children: Not on file  . Years of education: 63  . Highest education level: Not on file  Occupational History  . 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 Financial resource strain: Not on file  . Food insecurity:    Worry: Not on file    Inability: Not on file  . Transportation needs:    Medical: Not on file    Non-medical: Not on file  Tobacco Use  . Smoking status: Never Smoker  . Smokeless tobacco: Never Used  Substance and Sexual Activity  . Alcohol use: No    Alcohol/week: 0.0 standard drinks  . Drug use: No  . Sexual activity: Yes  Lifestyle  . Physical activity:    Days per week: Not on file    Minutes per session: Not on file  . Stress: Not on file  Relationships  . Social connections:    Talks on phone: Not on file    Gets together: Not on file    Attends religious service: Not on file    Active member of club or organization: Not on file    Attends meetings of clubs or organizations: Not on file    Relationship status: Not on file  . Intimate partner violence:    Fear of current or ex partner: Not on file    Emotionally abused: Not on file    Physically abused: Not on file    Forced sexual activity: Not on file  Other Topics Concern  . Not on file  Social History Narrative  . Not on file    Family History  Problem Relation Age of Onset  . Renal cancer Mother   . Hypertension Mother   . Hypertension Sister   . Leukemia Maternal Uncle     Past Surgical History:  Procedure Laterality Date  . CATARACT EXTRACTION Bilateral 2017    ROS: Review of Systems Negative except as above PHYSICAL EXAM: BP (!) 171/98 (BP Location: Right Arm, Cuff Size: Normal) Comment: recheck  Pulse 94   Temp 98.4 F (36.9 C) (Oral)   Resp 16   Wt 229 lb 9.6 oz (104.1 kg)   SpO2 100%    BMI 29.48 kg/m   Physical Exam  General appearance - alert, well appearing, and in no distress Mental status - normal mood, behavior, speech, dress, motor activity, and thought processes Mouth - mucous membranes moist, pharynx normal without lesions Neck - supple, no significant adenopathy Chest - clear to  auscultation, no wheezes, rales or rhonchi, symmetric air entry Heart - normal rate, regular rhythm, normal S1, S2, no murmurs, rubs, clicks or gallops Extremities - peripheral pulses normal, no pedal edema, no clubbing or cyanosis  Results for orders placed or performed in visit on 01/03/18  POCT glucose (manual entry)  Result Value Ref Range   POC Glucose 92 70 - 99 mg/dl  POCT glycosylated hemoglobin (Hb A1C)  Result Value Ref Range   Hemoglobin A1C     HbA1c POC (<> result, manual entry)     HbA1c, POC (prediabetic range)     HbA1c, POC (controlled diabetic range) 10.3 (A) 0.0 - 7.0 %    ASSESSMENT AND PLAN:  1. Uncontrolled type 2 diabetes mellitus with peripheral neuropathy (HCC) A1c has improved but still not at goal. Increase Lantus to 42 units at bedtime.  Continue Humalog.  Encourage him to try to remember to take a pen with him when he is away from home Continue to encourage healthy eating habits - POCT glucose (manual entry) - POCT glycosylated hemoglobin (Hb A1C) - Lipid panel; Future - Insulin Glargine (LANTUS SOLOSTAR) 100 UNIT/ML Solostar Pen; Inject 42 Units into the skin daily at 10 pm.  Dispense: 5 pen; Refill: 3 - dapagliflozin propanediol (FARXIGA) 5 MG TABS tablet; Take 10 mg by mouth daily.  Dispense: 60 tablet; Refill: 2  2. 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 Impotence of organic origin He will have the testosterone level  drawn when he comes to the lab next week to have BMP.  We discussed trying him with Cialis and if no results and testosterone level is normal, refer him to a urologist.  I think this problem is due to his diabetes He will stop the Viagra. - tadalafil (CIALIS) 10 MG tablet; Take 1 tablet (10 mg total) by mouth daily as needed for erectile dysfunction.  Dispense: 10 tablet; Refill: 0  4. Essential hypertension Not at goal.  Add hydrochlorothiazide.  Return to the lab in 1 week for chemistry. Follow-up in 1 month with clinical pharmacist for blood pressure recheck. - hydrochlorothiazide (HYDRODIURIL) 25 MG tablet; Take 1 tablet (25 mg total) by mouth daily.  Dispense: 90 tablet; Refill: 3 - Basic Metabolic Panel; Future   Patient was given the opportunity to ask questions.  Patient verbalized understanding of the plan and was able to repeat key elements of the plan.   Orders Placed This Encounter  Procedures  . Basic Metabolic Panel  . Lipid panel  . Ambulatory referral to Ophthalmology  . POCT glucose (manual entry)  . POCT glycosylated hemoglobin (Hb A1C)     Requested Prescriptions   Signed Prescriptions Disp Refills  . hydrochlorothiazide (HYDRODIURIL) 25 MG tablet 90 tablet 3    Sig: Take 1 tablet (25 mg total) by mouth daily.  . tadalafil (CIALIS) 10 MG tablet 10 tablet 0    Sig: Take 1 tablet (10 mg total) by mouth daily as needed for erectile dysfunction.  . Insulin Glargine (LANTUS SOLOSTAR) 100 UNIT/ML Solostar Pen 5 pen 3    Sig: Inject 42 Units into the skin daily at 10 pm.  . dapagliflozin propanediol (FARXIGA) 5 MG TABS tablet 60 tablet 2    Sig: Take 10 mg by mouth daily.    Return in about  2 months (around 03/06/2018).  Karle Plumber, MD, FACP

## 2018-01-16 MED FILL — $HUMALOG 100 UNITS/ML KWIKP: 100 | 90 days supply | Qty: 9 | Fill #7

## 2018-01-16 MED FILL — HYDROCHLOROTHIAZIDE 25 MG T: 25 | 30 days supply | Qty: 30 | Fill #0

## 2018-01-16 MED FILL — TADALAFIL 10 MG TABS: 10 | 30 days supply | Qty: 10 | Fill #0

## 2018-01-16 MED FILL — $LANTUS SOLOSTAR 100 UNITS/: 100 | 78 days supply | Qty: 30 | Fill #1

## 2018-01-25 ENCOUNTER — Emergency Department (HOSPITAL_BASED_OUTPATIENT_CLINIC_OR_DEPARTMENT_OTHER): Payer: Self-pay

## 2018-01-25 ENCOUNTER — Encounter (HOSPITAL_COMMUNITY): Payer: Self-pay

## 2018-01-25 ENCOUNTER — Emergency Department (HOSPITAL_COMMUNITY)
Admission: EM | Admit: 2018-01-25 | Discharge: 2018-01-25 | Disposition: A | Discharge status: Home / Self Care | Payer: Self-pay | Attending: Emergency Medicine | Admitting: Emergency Medicine

## 2018-01-25 DIAGNOSIS — I10 Essential (primary) hypertension: Secondary | ICD-10-CM | POA: Insufficient documentation

## 2018-01-25 DIAGNOSIS — Z7982 Long term (current) use of aspirin: Secondary | ICD-10-CM | POA: Insufficient documentation

## 2018-01-25 DIAGNOSIS — M79605 Pain in left leg: Secondary | ICD-10-CM | POA: Insufficient documentation

## 2018-01-25 DIAGNOSIS — K0889 Other specified disorders of teeth and supporting structures: Secondary | ICD-10-CM | POA: Insufficient documentation

## 2018-01-25 DIAGNOSIS — R52 Pain, unspecified: Secondary | ICD-10-CM

## 2018-01-25 DIAGNOSIS — E119 Type 2 diabetes mellitus without complications: Secondary | ICD-10-CM | POA: Insufficient documentation

## 2018-01-25 DIAGNOSIS — Z79899 Other long term (current) drug therapy: Secondary | ICD-10-CM | POA: Insufficient documentation

## 2018-01-25 MED ORDER — ACETAMINOPHEN 325 MG PO TABS
650.0000 mg | ORAL_TABLET | Freq: Once | ORAL | Status: AC
  Administered 2018-01-25: 650 mg via ORAL
  Filled 2018-01-25: qty 2

## 2018-01-25 NOTE — Discharge Instructions (Addendum)
Your Doppler study does not show a blood clot in your leg today.  Your pain may be coming from muscle strain or arthritis.  Recommend Tylenol and Aleve.  Follow-up with your primary care provider for further evaluation and treatment.  Contact a dentist for further evaluation and treatment of your dental pain, you do not appear to have an abscess at this time rather the gum is receeding and exposing the roots of your tooth.

## 2018-01-25 NOTE — ED Provider Notes (Signed)
[facility] EMERGENCY DEPARTMENT Provider Note   CSN: [id number] Arrival date & time: 01/25/18  1031     History   Chief Complaint Chief Complaint  Patient presents with  . Leg Pain    HPI Ryan Jenkins is a 58 y.o. male.  58yo male presents with complaint of left leg pain x 1 week, concerned by may have a blood clot in his leg. Pain located medial thigh, worse with movement and palpation, extends to medial/posterior knee and calf. Denies history of PE/DVT, injury to the leg. Reports slight swelling to the leg. No other complaints or concerns.   After initial assessment also reports right upper toothache x 1 week, denies trauma, fever, drainage.      Past Medical History:  Diagnosis Date  . Diabetes mellitus without complication ([place])   . Hypertension     Patient Active Problem List   Diagnosis Date Noted  . 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 Intermittent diarrhea 09/27/2017  . Gastroparesis 09/27/2017  . Macroalbuminuric diabetic nephropathy ([place]) 06/25/2017  . History of falling 06/25/2017  . Hyperlipidemia 03/21/2017  . Vitamin B 12 deficiency 03/21/2017  . Uncontrolled type 2 diabetes mellitus with peripheral neuropathy ([place]) 02/05/2017  . Essential hypertension 02/05/2017  . Depression 02/05/2017  . Unintended weight loss 02/05/2017  . Gait disturbance 02/05/2017  . 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 Diabetic neuropathy, type II diabetes mellitus ([place]) 05/11/2014  . 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 CATARACT EXTRACTION Bilateral 2017        Home Medications    Prior to Admission medications   Medication Sig Start Date End Date Taking? Authorizing Provider  amLODipine (NORVASC) 10 MG tablet Take 1 tablet (10 mg total) by mouth daily. 11/04/17   Ladell Pier, MD  aspirin EC 81 MG tablet Take 1  tablet (81 mg total) by mouth daily. 02/05/17   Ladell Pier, MD  atorvastatin (LIPITOR) 10 MG tablet Take 1 tablet (10 mg total) by mouth daily. 02/06/17   Ladell Pier, MD  bismuth subsalicylate (PEPTO BISMOL) 262 MG/15ML suspension Take 30 mLs by mouth every 6 (six) hours as needed for indigestion.    [provider]  Blood Glucose Monitoring Suppl (TRUE METRIX METER) w/Device KIT Use as directed 02/05/17   Ladell Pier, MD  dapagliflozin propanediol (FARXIGA) 5 MG TABS tablet Take 10 mg by mouth daily. 01/03/18   Ladell Pier, MD  gabapentin (NEURONTIN) 300 MG capsule Take 2 capsules (600 mg total) by mouth 3 (three) times daily. 06/25/17   Ladell Pier, MD  glucose blood (TRUE METRIX BLOOD GLUCOSE TEST) test strip Use as instructed 02/05/17   Ladell Pier, MD  hydrochlorothiazide (HYDRODIURIL) 25 MG tablet Take 1 tablet (25 mg total) by mouth daily. 01/03/18   Ladell Pier, MD  Insulin Glargine (LANTUS SOLOSTAR) 100 UNIT/ML Solostar Pen Inject 42 Units into the skin daily at 10 pm. 01/03/18   Ladell Pier, MD  insulin lispro (HUMALOG KWIKPEN) 100 UNIT/ML KiwkPen 10 units subcut with meals 09/27/17   Ladell Pier, MD  Insulin Pen Needle (PEN NEEDLES) 31G X 8 MM MISC Use as directed 02/05/17   Ladell Pier, MD  lisinopril (PRINIVIL,ZESTRIL) 10 MG tablet Take 1.5 tablets (15 mg total) by mouth daily. 06/25/17   Ladell Pier, MD  pantoprazole ([place]) 20  MG tablet Take 1 tablet (20 mg total) by mouth daily. Patient not taking: Reported on 01/03/2018 06/18/17   Caccavale, Sophia, PA-C  sertraline (ZOLOFT) 50 MG tablet Take 1 tablet (50 mg total) by mouth daily. 1/2 tab daily x 2 wks then 1 tab daily PO 03/21/17   Ladell Pier, MD  tadalafil (CIALIS) 10 MG tablet Take 1 tablet (10 mg total) by mouth daily as needed for erectile dysfunction. 01/03/18   Ladell Pier, MD  TRUEPLUS LANCETS 28G MISC Use as directed 02/05/17    Ladell Pier, MD  vitamin B-12 (CYANOCOBALAMIN) 1000 MCG tablet Take 1 tablet (1,000 mcg total) by mouth daily. 02/06/17   Ladell Pier, MD    Family History Family History  Problem Relation Age of Onset  . Renal cancer Mother   . Hypertension Mother   . Hypertension Sister   . Leukemia Maternal Uncle     Social History Social History   Tobacco Use  . Smoking status: Never Smoker  . Smokeless tobacco: Never Used  Substance Use Topics  . Alcohol use: No    Alcohol/week: 0.0 standard drinks  . Drug use: No     Allergies   Claritin [loratadine] and Lyrica [pregabalin]   Review of Systems Review of Systems  Constitutional: Negative for fever.  Gastrointestinal: Negative for abdominal pain.  Genitourinary: Negative for decreased urine volume and difficulty urinating.  Musculoskeletal: Positive for myalgias. Negative for back pain and gait problem.  Skin: Negative for color change, rash and wound.  Allergic/Immunologic: Positive for immunocompromised state.  Neurological: Negative for weakness and numbness.  Hematological: Negative for adenopathy. Does not bruise/bleed easily.  Psychiatric/Behavioral: Negative for confusion.  All other systems reviewed and are negative.    Physical Exam Updated Vital Signs BP (!) 159/96 (BP Location: Right Arm)   Pulse 90   Temp 98.3 F (36.8 C) (Oral)   Resp 16   SpO2 100%   Physical Exam Vitals signs and nursing note reviewed.  Constitutional:      General: He is not in acute distress.    Appearance: He is well-developed. He is not diaphoretic.  HENT:     Head: Normocephalic and atraumatic.     Mouth/Throat:   Cardiovascular:     Pulses: Normal pulses.  Pulmonary:     Effort: Pulmonary effort is normal.  Musculoskeletal:        General: Tenderness present. No swelling, deformity or signs of injury.     Left hip: He exhibits normal range of motion, normal strength and no bony tenderness.     Left knee: He  exhibits normal range of motion, no swelling, no effusion, no ecchymosis, no deformity and no erythema. Tenderness found. Medial joint line tenderness noted.     Right lower leg: No edema.     Left lower leg: No edema.       Legs:     Comments: TTP medial left thigh extending to medial left knee. No pain with palpation posterior knee or left calf, no leg swelling. Pain reproduced in thigh with palpation as well as abduction.   Skin:    General: Skin is warm and dry.     Findings: No erythema or rash.  Neurological:     Mental Status: He is alert and oriented to person, place, and time.  Psychiatric:        Behavior: Behavior normal.      ED Treatments / Results  Labs (all labs ordered are listed,  but only abnormal results are displayed) Labs Reviewed - No data to display  EKG None  Radiology No results found.  Procedures Procedures (including critical care time)  Medications Ordered in ED Medications  acetaminophen (TYLENOL) tablet 650 mg (650 mg Oral Given 01/25/18 1215)     Initial Impression / Assessment and Plan / ED Course  I have reviewed the triage vital signs and the nursing notes.  Pertinent labs & imaging results that were available during my care of the patient were reviewed by me and considered in my medical decision making (see chart for details).  Clinical Course as of Jan 25 1349  Sat Jan 25, 5665  [id number] 58 year old male presents with complaint of left leg pain concerned he may have a DVT.  No history of similar previously.  On exam he has tenderness to the medial left thigh extending to the medial left knee with joint line tenderness.  No tenderness palpation of the posterior knee or left calf.  Venous study of the left extremity is negative for DVT.  Patient also reports pain in a right upper tooth, on exam he is receding gingiva exposing the root of his tooth without obvious abscess.  Recommend patient follow-up with a dentist for his tooth, follow-up with  PCP for his leg, may take Tylenol and Aleve as directed.   [LM]    Clinical Course User Index [LM] Tacy Learn, PA-C   Final Clinical Impressions(s) / ED Diagnoses   Final diagnoses:  Left leg pain  Pain, dental    ED Discharge Orders    None       Tacy Learn, PA-C 01/25/18 1352    Hayden Rasmussen, MD 01/25/18 [phone]

## 2018-01-25 NOTE — ED Triage Notes (Signed)
Pt presents for evaluation of L leg pain x 1 week. Hx of neuropathy, has been using topical creams with no relief. No fall or injury.

## 2018-01-25 NOTE — Progress Notes (Signed)
VASCULAR LAB PRELIMINARY  PRELIMINARY  PRELIMINARY  PRELIMINARY  Left lower extremity venous duplex completed.    Preliminary report:  There is no DVT or SVT noted in the left lower extremity.   Called results to [place], RN  [facility], [place], RVT 01/25/2018, 1:24 PM

## 2018-01-25 NOTE — ED Notes (Signed)
Patient verbalizes understanding of discharge instructions. Opportunity for questioning and answers were provided. Armband removed by staff, pt discharged from ED ambulatory.   

## 2018-02-03 ENCOUNTER — Encounter: Payer: Self-pay | Admitting: Pharmacist

## 2018-02-03 NOTE — Progress Notes (Deleted)
   S:    Patient arrives ***.    Presents to the clinic for hypertension evaluation, counseling, and management. Patient was referred by Dr. Wynetta Emery on 01/03/2018 .  At that visit, pt's BP elevated at 171/98. HCTZ 25 mg added to regimen.   Since that visit, pt presented to ED 01/25/2018 with leg pain/concern for LE DVT. BP in the ED 159/96. Work-up negative for DVT.   Patient {Actions; denies-reports:120008} adherence with medications.  Current BP Medications include:  Amlodipine 10 mg, HCTZ 25 mg daily, lisinopril 15 mg daily  Antihypertensives tried in the past include: metoprolol   Dietary habits include: *** Exercise habits include:*** Family / Social history: FHx of HTN (mother, sister), never smoker, denies alcohol use  ASCVD risk factors include:***  Home BP readings: ***  O:  L arm after 5 minutes rest: ***, HR ***  Last 3 Office BP readings: BP Readings from Last 3 Encounters:  01/25/18 (!) 159/96  01/03/18 (!) 171/98  11/04/17 (!) 162/77   BMET    Component Value Date/Time   NA 139 08/07/2017 1424   K 4.1 08/07/2017 1424   CL 102 08/07/2017 1424   CO2 23 08/07/2017 1424   GLUCOSE 171 (H) 08/07/2017 1424   GLUCOSE 172 (H) 06/18/2017 1517   BUN 10 08/07/2017 1424   CREATININE 1.07 08/07/2017 1424   CALCIUM 9.2 08/07/2017 1424   GFRNONAA 77 08/07/2017 1424   GFRAA 89 08/07/2017 1424   Renal function: CrCl cannot be calculated (Patient's most recent lab result is older than the maximum 21 days allowed.).  Clinical ASCVD: No  The 10-year ASCVD risk score Mikey Bussing DC Jr., et al., 2013) is: 30.2%   Values used to calculate the score:     Age: 58 years     Sex: Male     Is Non-Hispanic African American: Yes     Diabetic: Yes     Tobacco smoker: No     Systolic Blood Pressure: 867 mmHg     Is BP treated: Yes     HDL Cholesterol: 49 mg/dL     Total Cholesterol: 184 mg/dL  A/P: Hypertension longstanding/newly diagnosed currently *** on current medications. BP  Goal = *** mmHg. Patient {Is/is not:9024} adherent with current medications.  -{Meds adjust:18428} ***.  -F/u labs ordered - BMP -HM: UTD on appropriate vaccines  -Counseled on lifestyle modifications for blood pressure control including reduced dietary sodium, increased exercise, adequate sleep  Results reviewed and written information provided.   Total time in face-to-face counseling *** minutes.   F/U Clinic Visit in ***.    Patient seen with:  Beckey Rutter, PharmD Candidate  [employer]  Class of 2022'

## 2018-02-20 ENCOUNTER — Telehealth: Payer: Self-pay | Admitting: Internal Medicine

## 2018-02-20 NOTE — Telephone Encounter (Signed)
Pt was supposed to see me in January for a BP check but did not show up. Spoke with him on the phone. He reports dizziness and nausea that has improved but still persists. He attributes this to HCTZ. Scheduled a BP check for next week w/ me. Will adjust his regimen at that appointment. He is scheduled to follow-up with his PCP 03/06/18.

## 2018-02-20 NOTE — Telephone Encounter (Signed)
Pt called requesting a nurse call him back in regards to  -hydrochlorothiazide (HYDRODIURIL) 25 MG tablet  -gabapentin (NEURONTIN) 300 MG capsule  he reports dizziness and Nausea with his recent intake of these two medications, please follow up

## 2018-02-20 NOTE — Telephone Encounter (Signed)
Lurena Joiner can you assist with this please

## 2018-02-25 ENCOUNTER — Encounter: Payer: Self-pay | Admitting: Pharmacist

## 2018-02-25 ENCOUNTER — Ambulatory Visit: Payer: Self-pay | Attending: Family Medicine | Admitting: Pharmacist

## 2018-02-25 VITALS — BP 161/90 | HR 98

## 2018-02-25 DIAGNOSIS — R51 Headache: Secondary | ICD-10-CM | POA: Insufficient documentation

## 2018-02-25 DIAGNOSIS — E1165 Type 2 diabetes mellitus with hyperglycemia: Secondary | ICD-10-CM | POA: Insufficient documentation

## 2018-02-25 DIAGNOSIS — I10 Essential (primary) hypertension: Secondary | ICD-10-CM | POA: Insufficient documentation

## 2018-02-25 DIAGNOSIS — IMO0002 Reserved for concepts with insufficient information to code with codable children: Secondary | ICD-10-CM

## 2018-02-25 DIAGNOSIS — T502X6A Underdosing of carbonic-anhydrase inhibitors, benzothiadiazides and other diuretics, initial encounter: Secondary | ICD-10-CM | POA: Insufficient documentation

## 2018-02-25 DIAGNOSIS — N529 Male erectile dysfunction, unspecified: Secondary | ICD-10-CM

## 2018-02-25 DIAGNOSIS — T464X6A Underdosing of angiotensin-converting-enzyme inhibitors, initial encounter: Secondary | ICD-10-CM | POA: Insufficient documentation

## 2018-02-25 DIAGNOSIS — T461X6A Underdosing of calcium-channel blockers, initial encounter: Secondary | ICD-10-CM | POA: Insufficient documentation

## 2018-02-25 DIAGNOSIS — Z91128 Patient's intentional underdosing of medication regimen for other reason: Secondary | ICD-10-CM | POA: Insufficient documentation

## 2018-02-25 DIAGNOSIS — E1142 Type 2 diabetes mellitus with diabetic polyneuropathy: Secondary | ICD-10-CM | POA: Insufficient documentation

## 2018-02-25 DIAGNOSIS — Z8249 Family history of ischemic heart disease and other diseases of the circulatory system: Secondary | ICD-10-CM | POA: Insufficient documentation

## 2018-02-25 MED FILL — AMLODIPINE BESYLATE 10 MG T: 10 | 30 days supply | Qty: 30 | Fill #1

## 2018-02-25 MED FILL — LISINOPRIL 10 MG TABS: 10 | 30 days supply | Qty: 45 | Fill #1

## 2018-02-25 NOTE — Progress Notes (Signed)
   S:    PCP: Dr. Wynetta Emery  Patient arrives in good spirits. Presents to the clinic for hypertension management. Patient was referred by Dr. Wynetta Emery on 02/20/18 after reporting dizziness with HCTZ.   Patient denies adherence with medications. Denies chest pain, dyspnea. Endorses "small headache".  Of note, reports not taking HCTZ d/t dizziness. Does not take the other two because he wants "a clear reason as to why he should take them". Has not taken anti-hypertensive medications in two weeks.   Current BP Medications include:   - amlodipine 10 mg daily - HCTZ 25 mg daily - lisinopril 15 mg (1.5 tablets daily of the 10 mg)  Dietary habits include: limits salt, denies drinking caffeine Exercise habits include: does not exercise d/t neuropathic pain Family / Social history:  - HTN (mother, sister) - Tobacco: never smoker - Alcohol: denies use  Home BP readings: not taking at home  O:  L arm after 5 minute use: 161/90, HR 98  Last 3 Office BP readings: BP Readings from Last 3 Encounters:  02/25/18 (!) 161/90  01/25/18 (!) 159/96  01/03/18 (!) 171/98   BMET    Component Value Date/Time   NA 139 08/07/2017 1424   K 4.1 08/07/2017 1424   CL 102 08/07/2017 1424   CO2 23 08/07/2017 1424   GLUCOSE 171 (H) 08/07/2017 1424   GLUCOSE 172 (H) 06/18/2017 1517   BUN 10 08/07/2017 1424   CREATININE 1.07 08/07/2017 1424   CALCIUM 9.2 08/07/2017 1424   GFRNONAA 77 08/07/2017 1424   GFRAA 89 08/07/2017 1424   Renal function: CrCl cannot be calculated (Patient's most recent lab result is older than the maximum 21 days allowed.).  Clinical ASCVD: No The 10-year ASCVD risk score Mikey Bussing DC Jr., et al., 2013) is: 30.8%   Values used to calculate the score:     Age: 43 years     Sex: Male     Is Non-Hispanic African American: Yes     Diabetic: Yes     Tobacco smoker: No     Systolic Blood Pressure: 591 mmHg     Is BP treated: Yes     HDL Cholesterol: 49 mg/dL     Total Cholesterol:  184 mg/dL  A/P: Hypertension longstanding currently uncontrolled not on medications. BP Goal = <130/80 mmHg. Patient is not adherent with current medications. Will discontinue HCTZ. After explaining risks/benefits of anti-hypertensive therapy, pt is agreeable to at least retrying amlodipine and lisinopril.   -Restarted amlodipine 10 mg daily. -Restarted lisinopril 15 mg (1.5 tablets daily of 10 mg)  -F/u labs ordered - BMP, Lipid per PCP recommendations -Counseled on lifestyle modifications for blood pressure control including reduced dietary sodium, increased exercise, adequate sleep - HM: UTD on vaccines  Results reviewed and written information provided. Total time in face-to-face counseling 15 minutes.   F/U Clinic Visit in 03/06/18 with PCP.    Benard Halsted, PharmD, [place] [phone]

## 2018-02-25 NOTE — Patient Instructions (Signed)
Thank you for coming to see [name] today.   Blood pressure today is elevated.  Please re-start lisinopril and amlodipine.   Lisinopril: take 1 and 1/2 tablets in the morning.   Amlodipine: take 1 tablet in the evening before bedtime.   Limiting salt and caffeine, as well as exercising as able for at least 30 minutes for 5 days out of the week, can also help you lower your blood pressure.  Take your blood pressure at home if you are able. Please write down these numbers and bring them to your visits.  If you have any questions about medications, please call me [phone].  Ryan Jenkins

## 2018-02-26 LAB — BASIC METABOLIC PANEL
BUN/Creatinine Ratio: 9 (ref 9–20)
BUN: 13 mg/dL (ref 6–24)
CO2: 24 mmol/L (ref 20–29)
CREATININE: 1.47 mg/dL — AB (ref 0.76–1.27)
Calcium: 9.4 mg/dL (ref 8.7–10.2)
Chloride: 95 mmol/L — ABNORMAL LOW (ref 96–106)
GFR calc Af Amer: 60 mL/min/{1.73_m2} (ref >59)
GFR calc non Af Amer: 52 mL/min/{1.73_m2} — ABNORMAL LOW (ref >59)
Glucose: 429 mg/dL — ABNORMAL HIGH (ref 65–99)
Potassium: 4.3 mmol/L (ref 3.5–5.2)
Sodium: 132 mmol/L — ABNORMAL LOW (ref 134–144)

## 2018-02-26 LAB — LIPID PANEL
Chol/HDL Ratio: 4.4 ratio (ref 0.0–5.0)
Cholesterol, Total: 172 mg/dL (ref 100–199)
HDL: 39 mg/dL — ABNORMAL LOW (ref >39)
LDL Calculated: 101 mg/dL — ABNORMAL HIGH (ref 0–99)
Triglycerides: 158 mg/dL — ABNORMAL HIGH (ref 0–149)
VLDL CHOLESTEROL CAL: 32 mg/dL (ref 5–40)

## 2018-02-26 LAB — TESTOSTERONE: Testosterone: 567 ng/dL (ref 264–916)

## 2018-02-28 ENCOUNTER — Telehealth: Payer: Self-pay

## 2018-02-28 NOTE — Telephone Encounter (Signed)
Contacted pt to go over lab results pt is aware and doesn't have any questions or concerns 

## 2018-03-06 ENCOUNTER — Ambulatory Visit: Payer: Self-pay | Attending: Internal Medicine | Admitting: Internal Medicine

## 2018-03-06 ENCOUNTER — Encounter: Payer: Self-pay | Admitting: Internal Medicine

## 2018-03-06 ENCOUNTER — Ambulatory Visit: Payer: Self-pay | Attending: Internal Medicine | Admitting: Licensed Clinical Social Worker

## 2018-03-06 VITALS — BP 170/98 | HR 101 | Temp 98.8°F | Resp 16 | Ht 74.0 in | Wt 214.0 lb

## 2018-03-06 DIAGNOSIS — E1165 Type 2 diabetes mellitus with hyperglycemia: Secondary | ICD-10-CM

## 2018-03-06 DIAGNOSIS — M542 Cervicalgia: Secondary | ICD-10-CM

## 2018-03-06 DIAGNOSIS — E11319 Type 2 diabetes mellitus with unspecified diabetic retinopathy without macular edema: Secondary | ICD-10-CM

## 2018-03-06 DIAGNOSIS — F331 Major depressive disorder, recurrent, moderate: Secondary | ICD-10-CM

## 2018-03-06 DIAGNOSIS — F419 Anxiety disorder, unspecified: Secondary | ICD-10-CM

## 2018-03-06 DIAGNOSIS — F32A Depression, unspecified: Secondary | ICD-10-CM

## 2018-03-06 DIAGNOSIS — E782 Mixed hyperlipidemia: Secondary | ICD-10-CM

## 2018-03-06 DIAGNOSIS — E1142 Type 2 diabetes mellitus with diabetic polyneuropathy: Secondary | ICD-10-CM

## 2018-03-06 DIAGNOSIS — IMO0002 Reserved for concepts with insufficient information to code with codable children: Secondary | ICD-10-CM

## 2018-03-06 DIAGNOSIS — I10 Essential (primary) hypertension: Secondary | ICD-10-CM

## 2018-03-06 DIAGNOSIS — F329 Major depressive disorder, single episode, unspecified: Secondary | ICD-10-CM

## 2018-03-06 DIAGNOSIS — N529 Male erectile dysfunction, unspecified: Secondary | ICD-10-CM

## 2018-03-06 LAB — GLUCOSE, POCT (MANUAL RESULT ENTRY): POC Glucose: 337 mg/dl — AB (ref 70–99)

## 2018-03-06 MED ORDER — INSULIN GLARGINE 100 UNIT/ML SOLOSTAR PEN: 40.0000 [IU] | PEN_INJECTOR | Freq: Every day | SUBCUTANEOUS | 3 refills | Status: DC

## 2018-03-06 MED ORDER — CYCLOBENZAPRINE HCL 5 MG PO TABS: 5.0000 mg | ORAL_TABLET | Freq: Two times a day (BID) | ORAL | 0 refills | Status: DC | PRN

## 2018-03-06 MED ORDER — ESCITALOPRAM OXALATE 10 MG PO TABS: 10.0000 mg | ORAL_TABLET | Freq: Every day | ORAL | 6 refills | Status: DC

## 2018-03-06 NOTE — Progress Notes (Signed)
Patient ID: Ryan Jenkins, male    DOB: [birth year]  MRN: [id number]  CC: Diabetes and Hypertension   Subjective: Ryan Jenkins is a 58 y.o. male who presents for chronic ds management. His concerns today include:  Pt with hx of DM with retinopathy, neuropathy (in feet and hands), macroalbuminuria,HTN, HL, Vit B12 def chronic LBP  HTN: saw [facility] 02/25/2018 for BP.  C/o dizziness whenever he took HCTZ.  He stopped all of his oral meds except Gabapentin because "I was scared the way my body was going."  Only taking Insulin and Gabapentin at this time.   Got very sick one night with dizziness, low BS, N/V.  This is when he decided to stop all oral meds.  Occur on 02/16/2018 Not checking BP Limits salt in foods  DM: checking BS in a.m.  Range 200-350 Still taking Lantus 34 units, did not increase to 42 units as discussed on last visit.  More consistent with taking Humalog pen with him when he knows he will be eating away from home -Referred to ophthalmology.  Pt states he was called twice by [facility].  Initially told that they do not have a charity program any more.  Called again a day later and was told they will be scheduling appt for him.  Sent forms to apply for charity care with them which he has not completed as yet  HL: Cholesterol was not at goal.  I recommend starting atorvastatin but he did not pick up this medicine as yet.     Impotence: No better with Cialis.  Testosterone level was normal.  He would like to move forward with referral to urology.  "My anxiety level and stress has been to the max." -just moved into another house and felt that played a role.  He was on Zoloft but stopped taking it when he stopped taking all of his other oral medications earlier this month.  He does not feel that it was helping.  He is open to trying a different antidepressant/antianxiety medication.  He is also open to doing some counseling.  Neck hurts since Sunday.  Muscles feel tight with  movement.  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 Diabetic retinopathy of both eyes associated with type 2 diabetes mellitus ([place]) 01/03/2018  . Intermittent diarrhea 09/27/2017  . Gastroparesis 09/27/2017  . Macroalbuminuric diabetic nephropathy ([place]) 06/25/2017  . History of falling 06/25/2017  . Hyperlipidemia 03/21/2017  . Vitamin B 12 deficiency 03/21/2017  . Uncontrolled type 2 diabetes mellitus with peripheral neuropathy ([place]) 02/05/2017  . Essential hypertension 02/05/2017  . Depression 02/05/2017  . Unintended weight loss 02/05/2017  . Gait disturbance 02/05/2017  . 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 Diabetic neuropathy, type II diabetes mellitus ([place]) 05/11/2014  . Metatarsal deformity 05/11/2014     Current Outpatient Medications on File Prior to Visit  Medication Sig Dispense Refill  . amLODipine (NORVASC) 10 MG tablet Take 1 tablet (10 mg total) by mouth daily. 30 tablet 6  . aspirin EC 81 MG tablet Take 1 tablet (81 mg total) by mouth daily. 100 tablet 4  . atorvastatin (LIPITOR) 10 MG tablet Take 1 tablet (10 mg total) by mouth daily. 90 tablet 3  . Blood Glucose Monitoring Suppl (TRUE METRIX METER) w/Device KIT  Use as directed 1 kit 0  . dapagliflozin propanediol (FARXIGA) 5 MG TABS tablet Take 10 mg by mouth daily. 60 tablet 2  . gabapentin (NEURONTIN) 300 MG capsule Take 2 capsules (600 mg total) by mouth 3 (three) times daily. 180 capsule 6  . glucose blood (TRUE METRIX BLOOD GLUCOSE TEST) test strip Use as instructed 100 each 12  . Insulin Glargine (LANTUS SOLOSTAR) 100 UNIT/ML Solostar Pen Inject 42 Units into the skin daily at 10 pm. 5 pen 3  . insulin lispro (HUMALOG KWIKPEN) 100 UNIT/ML KiwkPen 10 units subcut with meals 15 mL 11  . Insulin Pen Needle (PEN NEEDLES) 31G X 8 MM  MISC Use as directed 100 each 6  . lisinopril (PRINIVIL,ZESTRIL) 10 MG tablet Take 1.5 tablets (15 mg total) by mouth daily. 135 tablet 2  . pantoprazole (PROTONIX) 20 MG tablet Take 1 tablet (20 mg total) by mouth daily. (Patient not taking: Reported on 01/03/2018) 10 tablet 0  . sertraline (ZOLOFT) 50 MG tablet Take 1 tablet (50 mg total) by mouth daily. 1/2 tab daily x 2 wks then 1 tab daily PO 30 tablet 6  . tadalafil (CIALIS) 10 MG tablet Take 1 tablet (10 mg total) by mouth daily as needed for erectile dysfunction. 10 tablet 0  . TRUEPLUS LANCETS 28G MISC Use as directed 100 each 1  . vitamin B-12 (CYANOCOBALAMIN) 1000 MCG tablet Take 1 tablet (1,000 mcg total) by mouth daily. 100 tablet 3   No current facility-administered medications on file prior to visit.     Allergies  Allergen Reactions  . Claritin [Loratadine] Swelling    Joint swelling   . Hydrochlorothiazide     Dizziness  . Lyrica [Pregabalin]     depression    Social History   Socioeconomic History  . Marital status: Single    Spouse name: Not on file  . Number of children: Not on file  . Years of education: 70  . Highest education level: Not on file  Occupational History  . 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 Financial resource strain: Not on file  . Food insecurity:    Worry: Not on file    Inability: Not on file  . Transportation needs:    Medical: Not on file    Non-medical: Not on file  Tobacco Use  . Smoking status: Never Smoker  . Smokeless tobacco: Never Used  Substance and Sexual Activity  . Alcohol use: No    Alcohol/week: 0.0 standard drinks  . Drug use: No  . Sexual activity: Yes  Lifestyle  . Physical activity:    Days per week: Not on file    Minutes per session: Not on file  . Stress: Not on file  Relationships  . Social connections:    Talks on phone: Not on file    Gets together: Not on file    Attends religious service: Not on file     Active member of club or organization: Not on file    Attends meetings of clubs or organizations: Not on file    Relationship status: Not on file  . Intimate partner violence:    Fear of current or ex partner: Not on file    Emotionally abused: Not on file    Physically abused: Not on file    Forced sexual activity: Not on file  Other Topics Concern  . Not on file  Social History Narrative  . Not  on file    Family History  Problem Relation Age of Onset  . Renal cancer Mother   . Hypertension Mother   . Hypertension Sister   . Leukemia Maternal Uncle     Past Surgical History:  Procedure Laterality Date  . CATARACT EXTRACTION Bilateral 2017    ROS: Review of Systems Negative except as stated above  PHYSICAL EXAM: BP (!) 170/98   Pulse (!) 101   Temp 98.8 F (37.1 C) (Oral)   Resp 16   Ht 6' 2" (1.88 m)   Wt 214 lb (97.1 kg)   SpO2 100%   BMI 27.48 kg/m   Physical Exam  General appearance - alert, well appearing, and in no distress Mental status - normal mood, behavior, speech, dress, motor activity, and thought processes Chest - clear to auscultation, no wheezes, rales or rhonchi, symmetric air entry Heart - normal rate, regular rhythm, normal S1, S2, no murmurs, rubs, clicks or gallops Musculoskeletal -mild tenderness on palpation of the trapezius muscles on both sides.  No tenderness on palpation of the cervical spine.  Mild discomfort on flexion of the neck. Extremities -no lower extremity edema.  The 10-year ASCVD risk score Mikey Bussing DC Brooke Bonito., et al., 2013) is: 34.9%   Values used to calculate the score:     Age: 64 years     Sex: Male     Is Non-Hispanic African American: Yes     Diabetic: Yes     Tobacco smoker: No     Systolic Blood Pressure: 677 mmHg     Is BP treated: Yes     HDL Cholesterol: 39 mg/dL     Total Cholesterol: 172 mg/dL   Results for orders placed or performed in visit on 03/06/18  POCT glucose (manual entry)  Result Value Ref Range     POC Glucose 337 (A) 70 - 99 mg/dl   Lab Results  Component Value Date   HGBA1C 10.3 (A) 01/03/2018    Depression screen [facility] 2/9 03/06/2018 01/03/2018 11/04/2017  Decreased Interest _0 Down, Depressed, Hopeless _1 PHQ - 2 Score _2 Altered sleeping _3 Tired, decreased energy _4 Change in appetite _5 Feeling bad or failure about yourself  _6 Trouble concentrating _7 Moving slowly or fidgety/restless 0 1 0  Suicidal thoughts 1 0 0  PHQ-9 Score _8 GAD 7 : Generalized Anxiety Score 03/06/2018 01/03/2018 11/04/2017 09/27/2017  Nervous, Anxious, on Edge _9 Control/stop worrying _10 Worry too much - different things _11 Trouble relaxing _12 Restless 0 1 0 1  Easily annoyed or irritable _13 Afraid - awful might happen _14 Total GAD 7 Score _15 CMP Latest Ref Rng & Units 02/25/2018 08/07/2017 06/18/2017  Glucose 65 - 99 mg/dL 429(H) 171(H) 172(H)  BUN 6 - 24 mg/dL _16 Creatinine 0.76 - 1.27 mg/dL 1.47(H) 1.07 1.20  Sodium 134 - 144 mmol/L 132(L) 139 138  Potassium 3.5 - 5.2 mmol/L 4.3 4.1 3.7  Chloride 96 - 106 mmol/L 95(L) 102 104  CO2 20 - 29 mmol/L _17 Calcium 8.7 - 10.2 mg/dL 9.4 9.2 9.0  Total Protein 6.0 - 8.5 g/dL - 6.8 7.0  Total Bilirubin 0.0 - 1.2 mg/dL - 0.4 0.8  Alkaline Phos 39 - 117 IU/L - 100 58  AST 0 - 40 IU/L - 24 17  ALT 0 - 44 IU/L - 30 12(L)   Lipid Panel     Component Value Date/Time   CHOL 172 02/25/2018 1657   TRIG 158 (H) 02/25/2018 1657   HDL 39 (L) 02/25/2018 1657   CHOLHDL 4.4 02/25/2018 1657   LDLCALC 101 (H) 02/25/2018 1657    CBC    Component Value Date/Time   WBC 7.7 08/07/2017 1424   WBC 6.5 06/18/2017 1517   RBC 4.43 08/07/2017 1424   RBC 4.31 06/18/2017 1517   HGB 13.3 08/07/2017 1424   HCT 40.6 08/07/2017 1424   PLT 335 08/07/2017 1424   MCV 92 08/07/2017 1424   MCH 30.0 08/07/2017 1424   MCH 30.4 06/18/2017 1517   MCHC 32.8 08/07/2017  1424   MCHC 34.7 06/18/2017 1517   RDW 14.5 08/07/2017 1424   LYMPHSABS 2.6 08/07/2017 1424   EOSABS 0.2 08/07/2017 1424   BASOSABS 0.0 08/07/2017 1424    ASSESSMENT AND PLAN: 1. Uncontrolled type 2 diabetes mellitus with peripheral neuropathy (HCC) Encourage compliance with medications to help prevent progression and further complication from his diabetes.  I went over blood sugar goals.  Went over how to manage and treat hypoglycemic episodes.  I discourage stopping all medications for occasional hypoglycemia. Increase Lantus to 40 units daily.  Continue Humalog and Farxiga - POCT glucose (manual entry) - Insulin Glargine (LANTUS SOLOSTAR) 100 UNIT/ML Solostar Pen; Inject 40 Units into the skin daily at 10 pm.  Dispense: 5 pen; Refill: 3  2. Diabetic retinopathy of both eyes associated with type 2 diabetes mellitus, macular edema presence unspecified, unspecified retinopathy severity ([place]) Encouraged him to fill out the paperwork that the eye clinic at [facility] sent him as they were probably waiting to receive that back before they schedule an appointment.  I have given him the phone number for their eye clinic there so that he can call them once he is mailed off his paperwork  3. Essential hypertension Not at goal.  Patient is at increased risk for acute cardiovascular events given history of hypertension, diabetes and cholesterol all of which are uncontrolled.  Encourage compliance with medications and low-salt diet.  He will restart lisinopril and amlodipine we will hold off on restarting the HCTZ for now since he reports some dizziness when he took it.  4. Anxiety and depression Patient denies suicidal ideation.  Change Zoloft to Lexapro.  LCSW to meet with him today to inform him of counseling services available in the [facility] area - escitalopram (LEXAPRO) 10 MG tablet; Take 1 tablet (10 mg total) by mouth daily.  Dispense: 30 tablet; Refill: 6  5. 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  I recommend warm heat as needed and Flexeril as needed.  I warned him that the Flexeril can cause drowsiness - cyclobenzaprine (FLEXERIL) 5 MG tablet; Take 1 tablet (5 mg total) by mouth 2 (two) times daily as needed for muscle spasms (medication can cause drowsiness).  Dispense: 15 tablet; Refill: 0  6. Mixed hyperlipidemia Patient plans to pick up the prescription for Lipitor today and start taking  7. Impotence Likely related to diabetes. - Ambulatory referral to Urology    Patient was given the opportunity to ask questions.  Patient verbalized understanding of the plan and was able  to repeat key elements of the plan.   Orders Placed This Encounter  Procedures  . POCT glucose (manual entry)     Requested Prescriptions    No prescriptions requested or ordered in this encounter    No follow-ups on file.  Karle Plumber, MD, FACP

## 2018-03-06 NOTE — Patient Instructions (Addendum)
Please restart your 2 blood pressure medications.  Please check your blood pressure at least twice a week.  Goal is 130/80 or lower.  Your cholesterol is not at goal.  I have sent a prescription to the pharmacy for atorvastatin which you should start taking once a day.  Increase Lantus to 40 units daily.  Continue to monitor blood sugars.   Stop Zoloft.  We have changed it to Lexapro 10 mg daily.  I have sent a prescription for muscle relaxant to call Flexeril to use as needed.  It can cause some drowsiness.  Please use some warm heat like a heating pad to the neck as needed.

## 2018-03-11 NOTE — BH Specialist Note (Signed)
Integrated Behavioral Health Initial Visit  MRN: [id number] Name: Ryan Jenkins  Number of [place] Clinician visits:: 1/6 Session Start time: 12:00 PM  Session End time: 12:15 PM Total time: 15 minutes  Type of Service: [place] Interpretor:No. Interpretor Name and Language: N/A   Warm Hand Off Completed.       SUBJECTIVE: Ryan Jenkins is a 58 y.o. male accompanied by self Patient was referred by Dr. Wynetta Emery for counseling resources. Patient reports the following symptoms/concerns: Pt shared that he has been having difficulty managing mental and physical health Duration of problem: Ongoing; Severity of problem: moderate  OBJECTIVE: Mood: Anxious and Affect: Appropriate Risk of harm to self or others: No plan to harm self or others Pt scored positive on phq9; however, denies current SI/HI. Supportive resources, including crisis intervention, were provided  LIFE CONTEXT: Family and Social: Pt recently moved into a new residence at the beginning of the year.  School/Work: Pt is applying for disability Self-Care: Pt enjoys participating in ministry Life Changes: Pt has difficulty managing mental and physical health  GOALS ADDRESSED: Patient will: 1. Reduce symptoms of: anxiety, depression and stress 2. Increase knowledge and/or ability of: self-management skills  3. Demonstrate ability to: Increase healthy adjustment to current life circumstances and Improve medication compliance  INTERVENTIONS: Interventions utilized: Motivational Interviewing and Link to [employer] Assessments completed: GAD-7 and PHQ 2&9 with C-SSRS  ASSESSMENT: Patient currently experiencing anxiety and depression triggered by recent move, divorce, and difficulty managing ongoing health conditions. Pt reports chronic pain with neuropathy and an episode three weeks ago when he became really ill. Since then, pt  discontinued his medications reporting that one of his meds made him sick. Pt scored positive on phq9; however, denies current SI/HI. Supportive resources, including crisis intervention, were provided   Patient may benefit from psychotherapy and reinitiating medication management. The benefits of counseling were discussed and resources were provided. Pt agreed to start taking Lexapro to assist with managing mental health.   PLAN: 1. Follow up with behavioral health clinician on : Pt was encouraged to contact LCSWA if symptoms worsen or fail to improve to schedule behavioral appointments at [facility] 2. Behavioral recommendations: LCSWA recommends that pt comply with medication management and utilize provided resources 3. Referral(s): [place] (LME/Outside Clinic) 4. "From scale of 1-10, how likely are you to follow plan?":   Rebekah [place], LCSW 03/11/2018 1:13 PM

## 2018-04-02 MED FILL — ESCITALOPRAM 10 MG TABLET: 10 | 30 days supply | Qty: 30 | Fill #0

## 2018-04-02 MED FILL — CYCLOBENZAPRINE 5 MG TABLET: 5 | 7 days supply | Qty: 15 | Fill #0

## 2018-04-02 MED FILL — GABAPENTIN 300 MG CAPSULE: 300 | 30 days supply | Qty: 180 | Fill #3

## 2018-05-30 MED FILL — GABAPENTIN 300 MG CAPSULE: 300 | 30 days supply | Qty: 180 | Fill #4

## 2018-05-30 MED FILL — LISINOPRIL 10 MG TABS: 10 | 30 days supply | Qty: 45 | Fill #2

## 2018-06-03 ENCOUNTER — Ambulatory Visit: Payer: Self-pay | Attending: Internal Medicine | Admitting: Internal Medicine

## 2018-06-03 ENCOUNTER — Other Ambulatory Visit: Payer: Self-pay | Admitting: Pharmacist

## 2018-06-03 ENCOUNTER — Encounter: Payer: Self-pay | Admitting: Internal Medicine

## 2018-06-03 ENCOUNTER — Other Ambulatory Visit: Payer: Self-pay

## 2018-06-03 DIAGNOSIS — IMO0002 Reserved for concepts with insufficient information to code with codable children: Secondary | ICD-10-CM

## 2018-06-03 DIAGNOSIS — E1142 Type 2 diabetes mellitus with diabetic polyneuropathy: Secondary | ICD-10-CM

## 2018-06-03 DIAGNOSIS — R269 Unspecified abnormalities of gait and mobility: Secondary | ICD-10-CM

## 2018-06-03 DIAGNOSIS — E538 Deficiency of other specified B group vitamins: Secondary | ICD-10-CM

## 2018-06-03 DIAGNOSIS — E785 Hyperlipidemia, unspecified: Secondary | ICD-10-CM

## 2018-06-03 DIAGNOSIS — E1169 Type 2 diabetes mellitus with other specified complication: Secondary | ICD-10-CM

## 2018-06-03 DIAGNOSIS — N529 Male erectile dysfunction, unspecified: Secondary | ICD-10-CM

## 2018-06-03 DIAGNOSIS — E11319 Type 2 diabetes mellitus with unspecified diabetic retinopathy without macular edema: Secondary | ICD-10-CM

## 2018-06-03 DIAGNOSIS — F419 Anxiety disorder, unspecified: Secondary | ICD-10-CM

## 2018-06-03 DIAGNOSIS — F329 Major depressive disorder, single episode, unspecified: Secondary | ICD-10-CM

## 2018-06-03 DIAGNOSIS — E1165 Type 2 diabetes mellitus with hyperglycemia: Secondary | ICD-10-CM

## 2018-06-03 DIAGNOSIS — I10 Essential (primary) hypertension: Secondary | ICD-10-CM

## 2018-06-03 MED ORDER — AMLODIPINE BESYLATE 10 MG PO TABS: 10.0000 mg | ORAL_TABLET | Freq: Every day | ORAL | 6 refills | Status: DC

## 2018-06-03 MED ORDER — VITAMIN B-12 1000 MCG PO TABS: 1000.0000 ug | ORAL_TABLET | Freq: Every day | ORAL | 3 refills | Status: DC

## 2018-06-03 MED ORDER — ESCITALOPRAM OXALATE 10 MG PO TABS: 10.0000 mg | ORAL_TABLET | Freq: Every day | ORAL | 6 refills | Status: DC

## 2018-06-03 MED ORDER — INSULIN LISPRO (1 UNIT DIAL) 100 UNIT/ML (KWIKPEN): PEN_INJECTOR | SUBCUTANEOUS | 11 refills | Status: DC

## 2018-06-03 MED ORDER — DAPAGLIFLOZIN PROPANEDIOL 5 MG PO TABS: 10.0000 mg | ORAL_TABLET | Freq: Every day | ORAL | 2 refills | Status: DC

## 2018-06-03 MED ORDER — CYCLOBENZAPRINE HCL 5 MG PO TABS: 5.0000 mg | ORAL_TABLET | Freq: Two times a day (BID) | ORAL | 0 refills | Status: DC | PRN

## 2018-06-03 MED ORDER — DAPAGLIFLOZIN PROPANEDIOL 10 MG PO TABS: 10.0000 mg | ORAL_TABLET | Freq: Every day | ORAL | 2 refills | Status: DC

## 2018-06-03 MED ORDER — ATORVASTATIN CALCIUM 10 MG PO TABS: 10.0000 mg | ORAL_TABLET | Freq: Every day | ORAL | 3 refills | Status: DC

## 2018-06-03 MED FILL — CYCLOBENZAPRINE 5 MG TABLET: 5 | 7 days supply | Qty: 15 | Fill #0

## 2018-06-03 MED FILL — ATORVASTATIN 10 MG TABLET: 10 | 30 days supply | Qty: 30 | Fill #0

## 2018-06-03 MED FILL — ?HUMALOG 100 UNITS/ML KWIKP: 100 | 27 days supply | Qty: 9 | Fill #0

## 2018-06-03 MED FILL — FARXIGA 10 MG TABLET: 10 | 30 days supply | Qty: 30 | Fill #0

## 2018-06-03 MED FILL — AMLODIPINE BESYLATE 10 MG T: 10 | 30 days supply | Qty: 30 | Fill #0

## 2018-06-03 MED FILL — ESCITALOPRAM 10 MG TABLET: 10 | 30 days supply | Qty: 30 | Fill #0

## 2018-06-03 NOTE — Progress Notes (Signed)
Virtual Visit via Telephone Note Due to current restrictions/limitations of in-office visits due to the COVID-19 pandemic, this scheduled clinical appointment was converted to a telehealth visit  I connected with Ryan Jenkins on 06/03/18 at 8:55 a.m EDT by telephone and verified that I am speaking with the correct person using two identifiers. I am in my office.  The patient is at home.  Only the patient and myself participated in this encounter.  I discussed the limitations, risks, security and privacy concerns of performing an evaluation and management service by telephone and the availability of in person appointments. I also discussed with the patient that there may be a patient responsible charge related to this service. The patient expressed understanding and agreed to proceed.  History of Present Illness: Pt with hx of DM with retinopathy, neuropathy (in feet and hands), macroalbuminuria,HTN, HL, anxiety, Vit B12 def chronic LBP.  Last seen 02/2018.  HTN:  Checking BP 3 x a wk.  BP this a.m was 181/101.  Reports he was out of Lisinopril for 1 wk. Just received mailed RF yesterday.  Reports compliance with Norvasc.  Before running out of Lisinopril, BP range was 120s/80s -endorses LE edema when he does a lot of standing.  Wears support hose -slight HA occasionally.  NO CP/SOB  DIABETES TYPE 2 Last A1C:   Results for orders placed or performed in visit on 03/06/18  POCT glucose (manual entry)  Result Value Ref Range   POC Glucose 337 (A) 70 - 99 mg/dl    Last A1c 10.3 in 12/2017 Med Adherence:  _0  Yes   - Farxiga 10 mg, Lantus 42 units at nights and  Humalog 10 units with meals.  Some days he forgets to take Humalog pen with him to take with lunch Medication side effects:  _1  Yes    _2  No Home Monitoring?  _3  Yes  Before BF and at bedtime Home glucose results range: highest fasting is 120; bedtime range in 300s Diet Adherence: _4  Yes    _5  No Exercise: _6  Yes - walks a  little in his neighborhood.  Has to be careful because he still has problems with his balance.  Fell at home about 1 wk ago.  Has a cane which he takes with him most of the times but does not use in the house.  We will try to get him into physical therapy several times last year for this but was unsuccessful for various reasons.  One time he was called but did not keep the appointment because he was sick.  When the appointment was rescheduled by that time he had lost his orange card/cone discount..  Out of Vit B12 x 2 mths.  Last vit B 12 level 292 in 01/2017 Hypoglycemic episodes?: _7  Yes - about 2 episodes Numbness of the feet? _8  Yes    _9  No Retinopathy hx? _10  Yes - turned in form for charity care at [facility] about 1.5 wks ago.   Last eye exam:  Comments:   Impotence: no call for appt with urologist as yet.  Testosterone level was nl   HL:  LDL not at goal on last visit.  Out of Lipitor for about 1 wk.  Forgot to request from pharmacy  Anx/dep:  Zoloft changed to Lexapro on last visit.  Found it helpful but out of it for 1 mth.  Forgot to request refill  Outpatient Encounter Medications as of 06/03/2018  Medication Sig  . amLODipine (NORVASC) 10 MG tablet Take  1 tablet (10 mg total) by mouth daily.  [name] aspirin EC 81 MG tablet Take 1 tablet (81 mg total) by mouth daily.  [name] atorvastatin (LIPITOR) 10 MG tablet Take 1 tablet (10 mg total) by mouth daily.  . Blood Glucose Monitoring Suppl (TRUE METRIX METER) w/Device KIT Use as directed  . cyclobenzaprine (FLEXERIL) 5 MG tablet Take 1 tablet (5 mg total) by mouth 2 (two) times daily as needed for muscle spasms (medication can cause drowsiness).  . dapagliflozin propanediol (FARXIGA) 5 MG TABS tablet Take 10 mg by mouth daily.  [name] escitalopram (LEXAPRO) 10 MG tablet Take 1 tablet (10 mg total) by mouth daily.  [name] gabapentin (NEURONTIN) 300 MG capsule Take 2 capsules (600 mg total) by mouth 3 (three) times daily.  [name] glucose blood (TRUE METRIX BLOOD  GLUCOSE TEST) test strip Use as instructed  . Insulin Glargine (LANTUS SOLOSTAR) 100 UNIT/ML Solostar Pen Inject 40 Units into the skin daily at 10 pm.  . insulin lispro (HUMALOG KWIKPEN) 100 UNIT/ML KiwkPen 10 units subcut with meals  . Insulin Pen Needle (PEN NEEDLES) 31G X 8 MM MISC Use as directed  . lisinopril (PRINIVIL,ZESTRIL) 10 MG tablet Take 1.5 tablets (15 mg total) by mouth daily.  . pantoprazole (PROTONIX) 20 MG tablet Take 1 tablet (20 mg total) by mouth daily. (Patient not taking: Reported on 01/03/2018)  . tadalafil (CIALIS) 10 MG tablet Take 1 tablet (10 mg total) by mouth daily as needed for erectile dysfunction.  . TRUEPLUS LANCETS 28G MISC Use as directed  . vitamin B-12 (CYANOCOBALAMIN) 1000 MCG tablet Take 1 tablet (1,000 mcg total) by mouth daily.   No facility-administered encounter medications on file as of 06/03/2018.       Observations/Objective: Home blood pressure and blood sugar as stated above    Chemistry      Component Value Date/Time   NA 132 (L) 02/25/2018 1657   K 4.3 02/25/2018 1657   CL 95 (L) 02/25/2018 1657   CO2 24 02/25/2018 1657   BUN 13 02/25/2018 1657   CREATININE 1.47 (H) 02/25/2018 1657      Component Value Date/Time   CALCIUM 9.4 02/25/2018 1657   ALKPHOS 100 08/07/2017 1424   AST 24 08/07/2017 1424   ALT 30 08/07/2017 1424   BILITOT 0.4 08/07/2017 1424     Lab Results  Component Value Date   WBC 7.7 08/07/2017   HGB 13.3 08/07/2017   HCT 40.6 08/07/2017   MCV 92 08/07/2017   PLT 335 08/07/2017   Lab Results  Component Value Date   CHOL 172 02/25/2018   HDL 39 (L) 02/25/2018   LDLCALC 101 (H) 02/25/2018   TRIG 158 (H) 02/25/2018   CHOLHDL 4.4 02/25/2018     Assessment and Plan: 1. Uncontrolled type 2 diabetes mellitus with peripheral neuropathy ([place]) -Given that his bedtime blood sugars are above goal.  I recommend increasing the dose of Humalog with his dinner from 10 units to 13 units.  He will continue taking 10  units with breakfast and lunch.  Continue current dose of Lantus and for [employer]. -Check blood sugars before breakfast and before dinner.  Follow-up with clinical pharmacist in 2 weeks with these numbers for further titration of medication.  Patient to stop at the laboratory on that visit to have A1c and chemistry done. - dapagliflozin propanediol (FARXIGA) 5 MG TABS tablet; Take 10 mg by mouth daily.  Dispense: 60 tablet; Refill: 2 - Hemoglobin A1c; Future - Basic Metabolic Panel;  Future  2. 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  He just sent in the form 1-1/2 weeks ago.  He still has the phone number that I gave him on last visit for [facility] ophthalmology to call and schedule the appointment  3. Essential hypertension Not at goal.  He has been out of 1 of his blood pressure medication which he just got yesterday.  Continue amlodipine and lisinopril.  Continue to monitor blood pressure - amLODipine (NORVASC) 10 MG tablet; Take 1 tablet (10 mg total) by mouth daily.  Dispense: 30 tablet; Refill: 6  4. Hyperlipidemia associated with type 2 diabetes mellitus ([place]) Not at goal.  Refill Lipitor.  Encourage compliance - atorvastatin (LIPITOR) 10 MG tablet; Take 1 tablet (10 mg total) by mouth daily.  Dispense: 90 tablet; Refill: 3  5. Impotence We will send message to our referral coordinator letting them know that he has not received appointment as yet with the urologist  6. Anxiety and depression Reportedly did well with Lexapro.  Needing refill.  7. Gait disturbance Encourage patient to take the vitamin B12 daily as prescribed. Encouraged him to use his cane whenever he ambulates. Consider imaging of head on follow-up visit and resubmitting referral to P.T once COVID-19 pandemic dies down.  8. Vitamin B 12  deficiency - vitamin B-12 (CYANOCOBALAMIN) 1000 MCG tablet; Take 1 tablet (1,000 mcg total) by mouth daily.  Dispense: 100 tablet; Refill: 3   Follow Up Instructions: Follow-up in 3 months.   I discussed the assessment and treatment plan with the patient. The patient was provided an opportunity to ask questions and all were answered. The patient agreed with the plan and demonstrated an understanding of the instructions.   The patient was advised to call back or seek an in-person evaluation if the symptoms worsen or if the condition fails to improve as anticipated.  I provided 22 minutes of non-face-to-face time during this encounter.   Karle Plumber, MD

## 2018-06-17 ENCOUNTER — Ambulatory Visit: Payer: Self-pay | Attending: Internal Medicine | Admitting: Pharmacist

## 2018-06-17 ENCOUNTER — Other Ambulatory Visit: Payer: Self-pay

## 2018-06-17 ENCOUNTER — Encounter: Payer: Self-pay | Admitting: Pharmacist

## 2018-06-17 VITALS — BP 123/79 | HR 90

## 2018-06-17 DIAGNOSIS — I10 Essential (primary) hypertension: Secondary | ICD-10-CM

## 2018-06-17 MED ORDER — LISINOPRIL 10 MG PO TABS: 20.0000 mg | ORAL_TABLET | Freq: Every day | ORAL | 2 refills | Status: DC

## 2018-06-17 MED FILL — LISINOPRIL 10 MG TABS: 10 | 30 days supply | Qty: 60 | Fill #0

## 2018-06-17 NOTE — Patient Instructions (Addendum)
Thank you for coming to see [name] today.   Blood pressure today is well-controlled!   Continue taking blood pressure medications as prescribed.   Limiting salt and caffeine, as well as exercising as able for at least 30 minutes for 5 days out of the week, can also help you lower your blood pressure.  Take your blood pressure at home if you are able. Remember to rest and relax for 5 minutes before taking. Please write down these numbers and bring them to your visits.  If you have any questions about medications, please call me [phone].  Lurena Joiner

## 2018-06-17 NOTE — Progress Notes (Signed)
   S:    PCP: Dr. Wynetta Emery  Patient arrives in good spirits. Presents to the clinic for hypertension evaluation, counseling, and management. Patient was referred by Dr. Wynetta Emery on 06/03/18.    Patient reports adherence with medications.  Patient denies chest pain, dyspnea, HA or dizziness - reports BLE when standing or walking for prolonged period of time.   Current BP Medications include:   - Amlodipine 10 mg daily - lisinopril 10 mg (1.5 tablets daily - reports taking 2)  Dietary habits include:  - Reports limiting salt - Denies drinking caffeine consistently  Exercise habits include:  - Limited d/t neuropathy Family / Social history:  - HTN (mother, sister) - Never smoker    - Denies alcohol intake   Home BP readings:  - Reports SBPs 170s - Reports DBPs 70s  O:  L arm after 5 minutes rest: 123/79, HR 90  Last 3 Office BP readings: BP Readings from Last 3 Encounters:  03/06/18 (!) 170/98  02/25/18 (!) 161/90  01/25/18 (!) 159/96   BMET    Component Value Date/Time   NA 132 (L) 02/25/2018 1657   K 4.3 02/25/2018 1657   CL 95 (L) 02/25/2018 1657   CO2 24 02/25/2018 1657   GLUCOSE 429 (H) 02/25/2018 1657   GLUCOSE 172 (H) 06/18/2017 1517   BUN 13 02/25/2018 1657   CREATININE 1.47 (H) 02/25/2018 1657   CALCIUM 9.4 02/25/2018 1657   GFRNONAA 52 (L) 02/25/2018 1657   GFRAA 60 02/25/2018 1657   Renal function: CrCl cannot be calculated (Patient's most recent lab result is older than the maximum 21 days allowed.).  Clinical ASCVD: No  The 10-year ASCVD risk score Mikey Bussing DC Jr., et al., 2013) is: 34.9%   Values used to calculate the score:     Age: 58 years     Sex: Male     Is Non-Hispanic African American: Yes     Diabetic: Yes     Tobacco smoker: No     Systolic Blood Pressure: 166 mmHg     Is BP treated: Yes     HDL Cholesterol: 39 mg/dL     Total Cholesterol: 172 mg/dL  A/P: Hypertension longstanding currently at goal on current medications. BP Goal =  <130/80 mmHg. Patient is adherent with current medications. He is taking two 10 mg tablets (20mg  total) of lisinopril. Will update this in his profile as his BP looks good today.   He has a hx of elevated pressures and his blood pressures at home reported to be high. Today's clinic BP is good - I question the validity of his home cuff. Will have him follow-up in 2 weeks with BP cuff to assess accuracy.   -Continued lisinopril and amlodipine. -Counseled on lifestyle modifications for blood pressure control including reduced dietary sodium, increased exercise, adequate sleep -A1c, BMP at next encounter; will assess glycemic control/med titration at that visit.   Results reviewed and written information provided. Total time in face-to-face counseling 15 minutes.   F/U Clinic Visit in 2 weeks.    Benard Halsted, PharmD, [place] [phone]

## 2018-07-03 ENCOUNTER — Ambulatory Visit: Payer: Self-pay | Attending: Internal Medicine | Admitting: Pharmacist

## 2018-07-03 ENCOUNTER — Other Ambulatory Visit: Payer: Self-pay

## 2018-07-03 ENCOUNTER — Encounter: Payer: Self-pay | Admitting: Pharmacist

## 2018-07-03 DIAGNOSIS — E1165 Type 2 diabetes mellitus with hyperglycemia: Secondary | ICD-10-CM

## 2018-07-03 DIAGNOSIS — E1142 Type 2 diabetes mellitus with diabetic polyneuropathy: Secondary | ICD-10-CM

## 2018-07-03 DIAGNOSIS — IMO0002 Reserved for concepts with insufficient information to code with codable children: Secondary | ICD-10-CM

## 2018-07-03 LAB — POCT GLYCOSYLATED HEMOGLOBIN (HGB A1C): Hemoglobin A1C: 10.3 % — AB (ref 4.0–5.6)

## 2018-07-03 LAB — GLUCOSE, POCT (MANUAL RESULT ENTRY): POC Glucose: 210 mg/dl — AB (ref 70–99)

## 2018-07-03 MED ORDER — LANTUS SOLOSTAR 100 UNIT/ML ~~LOC~~ SOPN: 46.0000 [IU] | PEN_INJECTOR | Freq: Every day | SUBCUTANEOUS | 3 refills | Status: DC

## 2018-07-03 MED ORDER — ATORVASTATIN CALCIUM 40 MG PO TABS: 40.0000 mg | ORAL_TABLET | Freq: Every day | ORAL | 2 refills | Status: DC

## 2018-07-03 MED FILL — ATORVASTATIN CALCIUM 40 MG: 40 | 30 days supply | Qty: 30 | Fill #0

## 2018-07-03 MED FILL — $LANTUS SOLOSTAR 100 UNITS/: 100 | 32 days supply | Qty: 15 | Fill #0

## 2018-07-03 NOTE — Progress Notes (Signed)
S:    PCP: Dr. Wynetta Emery   No chief complaint on file.  Patient arrives in good spirits.  Presents for diabetes management at the request of Dr. Wynetta Emery who saw the pt via telemedicine 06/03/18 - Dr. Wynetta Emery increased his Humalog dose at dinner. All other insulin doses were kept the same.    Patient reports diabetes was diagnosed >20 yrs. 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   Family/Social History:  - FHx: negative for DM - Tobacco: never smoker  - Alcohol: no  Insurance coverage/medication affordability: self-pay  Patient reports adherence with medications but is taking differently than prescribed.  Current diabetes medications include:  - Cannot tolerate metformin - Dapagliflozin 10 mg daily - Humalog 10, 10, 13 (still taking 10 before dinner- notes only injecting 5-10 minutes before eating) - Lantus 40 units daily (taking 42 units)  Patient denies hypoglycemic events.  Patient reported dietary habits: reports doing well with diet; reports limiting carbs; admits to occasional consumption of sweets  Patient-reported exercise habits: pt struggles to find time to exercise   Patient denies polyuria, polydipsia.  Patient reports basline neuropathy. Patient reports visual changes. Patient reports self foot exams.   O:  POCT glucose: 210  POCT A1c: 10.3  Home fasting CBG: 110s-200s  2 hour post-prandial/random CBG: not checking  Lab Results  Component Value Date   HGBA1C 10.3 (A) 07/03/2018   There were no vitals filed for this visit.  Lipid Panel     Component Value Date/Time   CHOL 172 02/25/2018 1657   TRIG 158 (H) 02/25/2018 1657   HDL 39 (L) 02/25/2018 1657   CHOLHDL 4.4 02/25/2018 1657   LDLCALC 101 (H) 02/25/2018 1657   Clinical ASCVD: No  The 10-year ASCVD risk score Mikey Bussing DC Jr., et al., 2013) is: 21.4%   Values used to calculate the score:     Age: 58 years     Sex: Male     Is Non-Hispanic African American: Yes  Diabetic: Yes     Tobacco smoker: No     Systolic Blood Pressure: 350 mmHg     Is BP treated: Yes     HDL Cholesterol: 39 mg/dL     Total Cholesterol: 172 mg/dL   A/P: Diabetes longstanding currently uncontrolled. A1c today unchanged from 01/12/18. Patient is able to verbalize appropriate hypoglycemia management plan. Patient is adherent with medication but is taking differently than prescribed. Will titrate insulin to achieve better fasting control.  His ASCVD risk is elevated. For improved glycemic control and ASCVD risk reduction, a GLP-1 RA could be used, however, not ideal given his gastroparesis. GLP-1 RA agents slow gastric emptying time and have not been studied in the setting of gastroparesis.   -Increased dose of Lantus to 46 units.  -Continue 10, 10, and 13 of Humalog 15 minutes before meals. Counseling provided.  -Extensively discussed pathophysiology of DM, recommended lifestyle interventions, dietary effects on glycemic control -Counseled on s/sx of and management of hypoglycemia - A1c  - BMP  ASCVD risk - primary prevention in patient with DM. Last LDL is not controlled. ASCVD risk score is >20%  - high intensity statin indicated. Pt is amenable to increasing dose of atorvastatin. Recommend LFTs in 4-6 weeks.  -Increased dose of atorvastatin to 40 mg.   HM:  - UTD on vaccines   Written patient instructions provided. Total time in face to face counseling 15 minutes.   Follow up Pharmacist Clinic Visit in 1 month.  Patient seen with: Jonathon Bellows PharmD Candidate [phone] [place], PharmD, [place] [phone]

## 2018-07-03 NOTE — Patient Instructions (Addendum)
Thank you for coming to see me today. Please do the following:  1. Increase Lantus to 46 units daily.  2. Continue Humalog but remember to tale 10 units before breakfast, 10 units before lunch, and 13 units before dinner. Inject this 15 minutes before you eat.  3. Continue checking blood sugars at home.  4. Continue making the lifestyle changes we've discussed together during our visit. Diet and exercise play a significant role in improving your blood sugars.  5. Follow-up with me in 1 month.   Hypoglycemia or low blood sugar:   Low blood sugar can happen quickly and may become an emergency if not treated right away.   While this shouldn't happen often, it can be brought upon if you skip a meal or do not eat enough. Also, if your insulin or other diabetes medications are dosed too high, this can cause your blood sugar to go to low.   Warning signs of low blood sugar include: 1. Feeling shaky or dizzy 2. Feeling weak or tired  3. Excessive hunger 4. Feeling anxious or upset  5. Sweating even when you aren't exercising  What to do if I experience low blood sugar? 1. Check your blood sugar with your meter. If lower than 70, proceed to step 2.  2. Treat with 3-4 glucose tablets or 3 packets of regular sugar. If these aren't around, you can try hard candy. Yet another option would be to drink 4 ounces of fruit juice or 6 ounces of REGULAR soda.  3. Re-check your sugar in 15 minutes. If it is still below 70, do what you did in step 2 again. If has come back up, go ahead and eat a snack or small meal at this time.

## 2018-07-04 LAB — BASIC METABOLIC PANEL
BUN/Creatinine Ratio: 8 — ABNORMAL LOW (ref 9–20)
BUN: 13 mg/dL (ref 6–24)
CO2: 24 mmol/L (ref 20–29)
Calcium: 9.1 mg/dL (ref 8.7–10.2)
Chloride: 101 mmol/L (ref 96–106)
Creatinine, Ser: 1.63 mg/dL — ABNORMAL HIGH (ref 0.76–1.27)
GFR calc Af Amer: 53 mL/min/{1.73_m2} — ABNORMAL LOW (ref >59)
GFR calc non Af Amer: 46 mL/min/{1.73_m2} — ABNORMAL LOW (ref >59)
Glucose: 216 mg/dL — ABNORMAL HIGH (ref 65–99)
Potassium: 4.1 mmol/L (ref 3.5–5.2)
Sodium: 140 mmol/L (ref 134–144)

## 2018-07-07 ENCOUNTER — Telehealth: Payer: Self-pay

## 2018-07-07 NOTE — Telephone Encounter (Signed)
Contacted pt to go over lab results pt is aware and doesn't have any questions or concerns 

## 2018-07-25 ENCOUNTER — Other Ambulatory Visit: Payer: Self-pay | Admitting: Internal Medicine

## 2018-07-25 DIAGNOSIS — E1142 Type 2 diabetes mellitus with diabetic polyneuropathy: Secondary | ICD-10-CM

## 2018-07-25 DIAGNOSIS — IMO0002 Reserved for concepts with insufficient information to code with codable children: Secondary | ICD-10-CM

## 2018-07-25 MED FILL — FARXIGA 10 MG TABLET: 10 | 30 days supply | Qty: 30 | Fill #1

## 2018-07-25 MED FILL — LISINOPRIL 10 MG TABS: 10 | 30 days supply | Qty: 60 | Fill #1

## 2018-07-28 MED FILL — TRUE METRIX TEST STRIP: 30 days supply | Qty: 100 | Fill #0

## 2018-07-29 MED FILL — GABAPENTIN 300 MG CAPSULE: 300 | 30 days supply | Qty: 180 | Fill #0

## 2018-07-31 ENCOUNTER — Other Ambulatory Visit: Payer: Self-pay

## 2018-07-31 ENCOUNTER — Ambulatory Visit: Payer: Self-pay | Attending: Family Medicine | Admitting: Pharmacist

## 2018-07-31 ENCOUNTER — Encounter: Payer: Self-pay | Admitting: Pharmacist

## 2018-07-31 VITALS — BP 133/83

## 2018-07-31 DIAGNOSIS — IMO0002 Reserved for concepts with insufficient information to code with codable children: Secondary | ICD-10-CM

## 2018-07-31 DIAGNOSIS — E1165 Type 2 diabetes mellitus with hyperglycemia: Secondary | ICD-10-CM

## 2018-07-31 DIAGNOSIS — E1142 Type 2 diabetes mellitus with diabetic polyneuropathy: Secondary | ICD-10-CM

## 2018-07-31 LAB — GLUCOSE, POCT (MANUAL RESULT ENTRY): POC Glucose: 222 mg/dl — AB (ref 70–99)

## 2018-07-31 MED FILL — $HUMALOG 100 UNITS/ML KWIKP: 100 | 81 days supply | Qty: 27 | Fill #1

## 2018-07-31 MED FILL — $LANTUS SOLOSTAR 100 UNITS/: 100 | 32 days supply | Qty: 15 | Fill #1

## 2018-07-31 MED FILL — ESCITALOPRAM 10 MG TABLET: 10 | 30 days supply | Qty: 30 | Fill #1

## 2018-07-31 NOTE — Progress Notes (Signed)
    S:    PCP: Dr. Wynetta Emery   No chief complaint on file.  Patient arrives in good spirits.  Presents for diabetes management at the request of Dr. Wynetta Emery who saw the pt via telemedicine 06/03/18 - Dr. Wynetta Emery increased his Humalog dose at dinner. I saw him last month and increased his Lantus dose.    Patient reports diabetes was diagnosed >20 yrs. 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   Family/Social History:  - FHx: negative for DM - Tobacco: never smoker  - Alcohol: no  Insurance coverage/medication affordability: self-pay  Patient reports adherence with medications. Current diabetes medications include:  - Cannot tolerate metformin - Dapagliflozin 10 mg daily - Humalog 10, 10, 14  - Lantus 46 units daily  Patient denies hypoglycemic events.  Patient reported dietary habits: reports doing well with diet; reports limiting carbs; admits to occasional consumption of sweets  Patient-reported exercise habits: pt struggles to find time to exercise   Patient denies polyuria, polydipsia.  Patient reports worsening neuropathy. Patient reports visual changes. Patient reports self foot exams.   O:  POCT glucose: 222 (reports that he ate late last night)  Home fasting CBG: reports 80s-100s 2 hour post-prandial/random CBG: 150s - 250s (reports higher levels after lunch)  Lab Results  Component Value Date   HGBA1C 10.3 (A) 07/03/2018   Vitals:   07/31/18 0947  BP: 133/83   Lipid Panel     Component Value Date/Time   CHOL 172 02/25/2018 1657   TRIG 158 (H) 02/25/2018 1657   HDL 39 (L) 02/25/2018 1657   CHOLHDL 4.4 02/25/2018 1657   LDLCALC 101 (H) 02/25/2018 1657   Clinical ASCVD: No  The 10-year ASCVD risk score Mikey Bussing DC Jr., et al., 2013) is: 24.4%   Values used to calculate the score:     Age: 58 years     Sex: Male     Is Non-Hispanic African American: Yes     Diabetic: Yes     Tobacco smoker: No     Systolic Blood Pressure: 941  mmHg     Is BP treated: Yes     HDL Cholesterol: 39 mg/dL     Total Cholesterol: 172 mg/dL   A/P: Diabetes longstanding currently uncontrolled. A1c today unchanged from 01/12/18. Patient is able to verbalize appropriate hypoglycemia management plan. Patient is adherent with medication. His lunch levels have been elevated but fating levels, with the exception of this AM, are good for the most part. Will increase lunchtime Humalog.   -Continue Lantus 46 units  -Increase lunch dose of Humalog to 14 units. Continue 10 units before breakfast and 14 before dinner.   -Extensively discussed pathophysiology of DM, recommended lifestyle interventions, dietary effects on glycemic control -Counseled on s/sx of and management of hypoglycemia  ASCVD risk - primary prevention in patient with DM. Last LDL is not controlled. ASCVD risk score is >20%  - high intensity statin indicated.  - Continue atorvastatin 40 mg  -LFT  HM:  - UTD on vaccines   Written patient instructions provided. Total time in face to face counseling 15 minutes.   Follow up w/ PCP next month.   Benard Halsted, PharmD, [place] [phone]

## 2018-07-31 NOTE — Patient Instructions (Signed)
Thank you for coming to see me today. Please do the following:  1. Increase lunchtime Humalog to 14 units. Continue to take 10 units before breakfast and 14 before dinner.  2. Continue to take Lantus 46 units daily.  3. Continue checking blood sugars at home. 4. Continue making the lifestyle changes we've discussed together during our visit. Diet and exercise play a significant role in improving your blood sugars.  5. Follow-up with PCP next month.    Hypoglycemia or low blood sugar:   Low blood sugar can happen quickly and may become an emergency if not treated right away.   While this shouldn't happen often, it can be brought upon if you skip a meal or do not eat enough. Also, if your insulin or other diabetes medications are dosed too high, this can cause your blood sugar to go to low.   Warning signs of low blood sugar include: 1. Feeling shaky or dizzy 2. Feeling weak or tired  3. Excessive hunger 4. Feeling anxious or upset  5. Sweating even when you aren't exercising  What to do if I experience low blood sugar? 1. Check your blood sugar with your meter. If lower than 70, proceed to step 2.  2. Treat with 3-4 glucose tablets or 3 packets of regular sugar. If these aren't around, you can try hard candy. Yet another option would be to drink 4 ounces of fruit juice or 6 ounces of REGULAR soda.  3. Re-check your sugar in 15 minutes. If it is still below 70, do what you did in step 2 again. If has come back up, go ahead and eat a snack or small meal at this time.

## 2018-08-01 ENCOUNTER — Telehealth: Payer: Self-pay

## 2018-08-01 LAB — HEPATIC FUNCTION PANEL
ALT: 25 IU/L (ref 0–44)
AST: 19 IU/L (ref 0–40)
Albumin: 4.3 g/dL (ref 3.8–4.9)
Alkaline Phosphatase: 118 IU/L — ABNORMAL HIGH (ref 39–117)
Bilirubin Total: 0.3 mg/dL (ref 0.0–1.2)
Bilirubin, Direct: 0.1 mg/dL (ref 0.00–0.40)
Total Protein: 7.7 g/dL (ref 6.0–8.5)

## 2018-08-01 NOTE — Telephone Encounter (Signed)
Contacted pt to go over lab results pt didn't answer left a detailed vm informing pt of results and if he has any questions or concerns to give me a call  

## 2018-09-04 ENCOUNTER — Encounter: Payer: Self-pay | Admitting: Internal Medicine

## 2018-09-04 ENCOUNTER — Ambulatory Visit: Payer: Self-pay | Attending: Internal Medicine | Admitting: Internal Medicine

## 2018-09-04 ENCOUNTER — Other Ambulatory Visit: Payer: Self-pay

## 2018-09-04 ENCOUNTER — Telehealth: Payer: Self-pay

## 2018-09-04 DIAGNOSIS — F419 Anxiety disorder, unspecified: Secondary | ICD-10-CM

## 2018-09-04 DIAGNOSIS — E538 Deficiency of other specified B group vitamins: Secondary | ICD-10-CM

## 2018-09-04 DIAGNOSIS — R1110 Vomiting, unspecified: Secondary | ICD-10-CM

## 2018-09-04 DIAGNOSIS — K219 Gastro-esophageal reflux disease without esophagitis: Secondary | ICD-10-CM

## 2018-09-04 DIAGNOSIS — N529 Male erectile dysfunction, unspecified: Secondary | ICD-10-CM

## 2018-09-04 DIAGNOSIS — F329 Major depressive disorder, single episode, unspecified: Secondary | ICD-10-CM

## 2018-09-04 DIAGNOSIS — E1165 Type 2 diabetes mellitus with hyperglycemia: Secondary | ICD-10-CM

## 2018-09-04 DIAGNOSIS — I10 Essential (primary) hypertension: Secondary | ICD-10-CM

## 2018-09-04 DIAGNOSIS — IMO0002 Reserved for concepts with insufficient information to code with codable children: Secondary | ICD-10-CM

## 2018-09-04 DIAGNOSIS — E11319 Type 2 diabetes mellitus with unspecified diabetic retinopathy without macular edema: Secondary | ICD-10-CM

## 2018-09-04 DIAGNOSIS — R269 Unspecified abnormalities of gait and mobility: Secondary | ICD-10-CM

## 2018-09-04 DIAGNOSIS — E1142 Type 2 diabetes mellitus with diabetic polyneuropathy: Secondary | ICD-10-CM

## 2018-09-04 MED ORDER — ATORVASTATIN CALCIUM 40 MG PO TABS: 40.0000 mg | ORAL_TABLET | Freq: Every day | ORAL | 6 refills | Status: DC

## 2018-09-04 MED ORDER — CYCLOBENZAPRINE HCL 5 MG PO TABS: 5.0000 mg | ORAL_TABLET | Freq: Two times a day (BID) | ORAL | 0 refills | Status: DC | PRN

## 2018-09-04 MED ORDER — AMLODIPINE BESYLATE 10 MG PO TABS: 10.0000 mg | ORAL_TABLET | Freq: Every day | ORAL | 6 refills | Status: DC

## 2018-09-04 MED ORDER — INSULIN LISPRO (1 UNIT DIAL) 100 UNIT/ML (KWIKPEN): PEN_INJECTOR | SUBCUTANEOUS | 11 refills | Status: DC

## 2018-09-04 MED ORDER — GABAPENTIN 300 MG PO CAPS: ORAL_CAPSULE | ORAL | 6 refills | Status: DC

## 2018-09-04 MED ORDER — ESCITALOPRAM OXALATE 20 MG PO TABS: 20.0000 mg | ORAL_TABLET | Freq: Every day | ORAL | 6 refills | Status: DC

## 2018-09-04 MED ORDER — ONDANSETRON HCL 4 MG PO TABS: 4.0000 mg | ORAL_TABLET | Freq: Three times a day (TID) | ORAL | 0 refills | Status: DC | PRN

## 2018-09-04 MED ORDER — PANTOPRAZOLE SODIUM 20 MG PO TBEC: 20.0000 mg | DELAYED_RELEASE_TABLET | Freq: Every day | ORAL | 6 refills | Status: DC

## 2018-09-04 NOTE — Progress Notes (Signed)
Virtual Visit via Telephone Note Due to current restrictions/limitations of in-office visits due to the COVID-19 pandemic, this scheduled clinical appointment was converted to a telehealth visit  I connected with Ryan Jenkins on 09/04/18 at 8:42 a.m by telephone and verified that I am speaking with the correct person using two identifiers. I am in my office.  The patient is at home.  Only the patient and myself participated in this encounter.  I discussed the limitations, risks, security and privacy concerns of performing an evaluation and management service by telephone and the availability of in person appointments. I also discussed with the patient that there may be a patient responsible charge related to this service. The patient expressed understanding and agreed to proceed.   History of Present Illness: Pt with hx of DM withretinopathy,neuropathy (in feet and hands), macroalbuminuria,HTN, HL, anxiety, Vit B12 def chronic LBP.   Gait Disturbance: reports 4 falls in last several wks -legs gave away going up the 4 steps to his house about 1 mth ago.  His neighbor had to come and help him get in the house.  Has rails on both sides to these steps -uses his cane consistently.   -reports compliance with B12 supplement -He would try to get him into physical therapy in the past but unsuccessful in doing so for various reasons.  On last visit we discussed submitting referral again once the COVID-19 pandemic dies down.  His orange card has expired.  DM: checking BS BID.  Before BF BS 98-120 and bedtime 200-300.  BS this a.m was 112 Taking Lantus 46 units at nights and Humalog 14 units with lunch and dinner.  He has seen the clinical pharmacist since last evaluation by me. Eating habits: doing better but still drinks regular Ginger Ale Still has not f/u on eye appt at [facility] as yet.  He requests today that I give him the phone number again so that he can call the eye center there to get an  appointment Still has episodes of  intermittent vomiting and diarrhea followed by constipation.  "Starts as an ingestion type thing then it goes into this craziness."   Has pantoprazole on his medication list but patient states he is not on it.   -Had normal gastric emptying study 09/2017  HTN: was checking BP 2 x a day until two days ago when machine gave out.  Limits salt in foods. Range 119/87,  Taking Norvasc and Lisinopril as prescribed Very little LE edema  Dep/Anxiety: He has been feeling more depressed recently.  He and his wife have been separated and she recently filed for divorce.  No thoughts of HI/SI.  Would like to speak with LCSW.  Feels he would benefit from higher dose of Lexapro  Impotence:  Cialis does not work.  He never got an appointment with the urologist.  However he also needs to reapply for the orange card/cone discount  Outpatient Encounter Medications as of 09/04/2018  Medication Sig  . amLODipine (NORVASC) 10 MG tablet Take 1 tablet (10 mg total) by mouth daily.  [name] aspirin EC 81 MG tablet Take 1 tablet (81 mg total) by mouth daily.  [name] atorvastatin (LIPITOR) 40 MG tablet Take 1 tablet (40 mg total) by mouth daily.  . Blood Glucose Monitoring Suppl (TRUE METRIX METER) w/Device KIT Use as directed  . cyclobenzaprine (FLEXERIL) 5 MG tablet Take 1 tablet (5 mg total) by mouth 2 (two) times daily as needed for muscle spasms (medication can cause drowsiness).  [name]  dapagliflozin propanediol (FARXIGA) 10 MG TABS tablet Take 10 mg by mouth daily.  [name] escitalopram (LEXAPRO) 10 MG tablet Take 1 tablet (10 mg total) by mouth daily.  [name] gabapentin (NEURONTIN) 300 MG capsule TAKE 2 CAPSULES BY MOUTH 3 TIMES DAILY.  [name] glucose blood test strip Use as instructed  . Insulin Glargine (LANTUS SOLOSTAR) 100 UNIT/ML Solostar Pen Inject 46 Units into the skin daily at 10 pm.  . insulin lispro (HUMALOG KWIKPEN) 100 UNIT/ML KwikPen 10 units subcu before breakfast and lunch, 13 units with  dinner.  . Insulin Pen Needle (PEN NEEDLES) 31G X 8 MM MISC Use as directed  . lisinopril (ZESTRIL) 10 MG tablet Take 2 tablets (20 mg total) by mouth daily.  . pantoprazole (PROTONIX) 20 MG tablet Take 1 tablet (20 mg total) by mouth daily. (Patient not taking: Reported on 01/03/2018)  . tadalafil (CIALIS) 10 MG tablet Take 1 tablet (10 mg total) by mouth daily as needed for erectile dysfunction.  . TRUEPLUS LANCETS 28G MISC Use as directed  . vitamin B-12 (CYANOCOBALAMIN) 1000 MCG tablet Take 1 tablet (1,000 mcg total) by mouth daily.   No facility-administered encounter medications on file as of 09/04/2018.     Observations/Objective:   Chemistry      Component Value Date/Time   NA 140 07/03/2018 1016   K 4.1 07/03/2018 1016   CL 101 07/03/2018 1016   CO2 24 07/03/2018 1016   BUN 13 07/03/2018 1016   CREATININE 1.63 (H) 07/03/2018 1016      Component Value Date/Time   CALCIUM 9.1 07/03/2018 1016   ALKPHOS 118 (H) 07/31/2018 0927   AST 19 07/31/2018 0927   ALT 25 07/31/2018 0927   BILITOT 0.3 07/31/2018 0927     Lab Results  Component Value Date   HGBA1C 10.3 (A) 07/03/2018   Lab Results  Component Value Date   CHOL 172 02/25/2018   HDL 39 (L) 02/25/2018   LDLCALC 101 (H) 02/25/2018   TRIG 158 (H) 02/25/2018   CHOLHDL 4.4 02/25/2018     Assessment and Plan: 1. Uncontrolled type 2 diabetes mellitus with peripheral neuropathy (HCC) Morning blood sugars are good but blood sugars before bedtime are quite elevated.  He will continue his current dose of Lantus.  Change Humalog to 14 units with breakfast and lunch and 17 units with dinner. -Encouraged him to avoid sugary drinks - atorvastatin (LIPITOR) 40 MG tablet; Take 1 tablet (40 mg total) by mouth daily.  Dispense: 30 tablet; Refill: 6 - gabapentin (NEURONTIN) 300 MG capsule; TAKE 2 CAPSULES BY MOUTH 3 TIMES DAILY.  Dispense: 180 capsule; Refill: 6  2. 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  I have encouraged him to make an effort in following up with [facility] eye clinic.  Phone number given  3. Essential hypertension Reported blood pressure not at goal.  I recommend adding another blood pressure medication but patient declined stating that he does not want any more medications.  He will continue current dose of lisinopril and amlodipine. - amLODipine (NORVASC) 10 MG tablet; Take 1 tablet (10 mg total) by mouth daily.  Dispense: 30 tablet; Refill: 6  4. Anxiety and depression Increase dose of Lexapro to 20 mg daily.  Will have LCSW follow-up with him - escitalopram (LEXAPRO) 20 MG tablet; Take 1 tablet (20 mg total) by mouth daily.  Dispense: 30  tablet; Refill: 6  5. Impotence of organic origin - Ambulatory referral to Urology  6. Gait disturbance Contributing factors include diabetic neuropathy, B12 deficiency and retinopathy.  He would probably benefit from having a Rollator walker but is not able to afford one.  I will send a message to our caseworker to see if there is any programs out there that donates refurbish ones - Ambulatory referral to Physical Therapy - CT Head Wo Contrast; Future  7. Vitamin B 12 deficiency Patient advised to come to the lab to have this level checked.  Advised to continue taking the vitamin B12 supplement daily  8. Recurrent vomiting Will refer to GI for further evaluation of these recurrent episodes of vomiting and diarrhea followed by constipation. - Ambulatory referral to Gastroenterology - ondansetron (ZOFRAN) 4 MG tablet; Take 1 tablet (4 mg total) by mouth every 8 (eight) hours as needed for nausea or vomiting.  Dispense: 20 tablet; Refill: 0  9. Gastroesophageal reflux disease without esophagitis GERD precautions discussed - pantoprazole (PROTONIX) 20 MG tablet; Take 1 tablet (20 mg total) by mouth daily.   Dispense: 30 tablet; Refill: 6   Follow Up Instructions: Follow-up again in 3 months   I discussed the assessment and treatment plan with the patient. The patient was provided an opportunity to ask questions and all were answered. The patient agreed with the plan and demonstrated an understanding of the instructions.   The patient was advised to call back or seek an in-person evaluation if the symptoms worsen or if the condition fails to improve as anticipated.  I provided 23 minutes of non-face-to-face time during this encounter.   Karle Plumber, MD

## 2018-09-04 NOTE — Progress Notes (Signed)
N/V/D and constipation LBM this a.m.- but constipated  4 falls this month.  Interested in getting a walker  CBG- 113 this a.m at home  Need RF's

## 2018-09-04 NOTE — Telephone Encounter (Signed)
At request of dr Wynetta Emery, call placed to patient and explained to him that this CM has a new RW that is free that he can pick up at [facility]. This CM can check with the congregational nurse program to see if they have a rollator but that may be difficult to find. He decided he would like the RW and will pick it up tomorrow at [facility]

## 2018-09-05 ENCOUNTER — Encounter: Payer: Self-pay | Admitting: Internal Medicine

## 2018-09-05 MED FILL — PANTOPRAZOLE SOD DR 20 MG T: 20 | 30 days supply | Qty: 30 | Fill #0

## 2018-09-05 MED FILL — AMLODIPINE BESYLATE 10 MG T: 10 | 30 days supply | Qty: 30 | Fill #0

## 2018-09-05 MED FILL — ONDANSETRON HCL 4 MG TABLET: 4 | 6 days supply | Qty: 20 | Fill #0

## 2018-09-05 MED FILL — ATORVASTATIN CALCIUM 40 MG: 40 | 30 days supply | Qty: 30 | Fill #0

## 2018-09-05 MED FILL — GABAPENTIN 300 MG CAPSULE: 300 | 30 days supply | Qty: 180 | Fill #0

## 2018-09-05 MED FILL — CYCLOBENZAPRINE 5 MG TABLET: 5 | 7 days supply | Qty: 15 | Fill #0

## 2018-09-05 MED FILL — ESCITALOPRAM 20 MG TABLET: 20 | 30 days supply | Qty: 30 | Fill #0

## 2018-09-16 ENCOUNTER — Ambulatory Visit: Payer: Self-pay | Attending: Internal Medicine

## 2018-09-16 ENCOUNTER — Other Ambulatory Visit: Payer: Self-pay

## 2018-09-16 DIAGNOSIS — M6281 Muscle weakness (generalized): Secondary | ICD-10-CM | POA: Insufficient documentation

## 2018-09-16 DIAGNOSIS — R2681 Unsteadiness on feet: Secondary | ICD-10-CM | POA: Insufficient documentation

## 2018-09-16 DIAGNOSIS — R262 Difficulty in walking, not elsewhere classified: Secondary | ICD-10-CM | POA: Insufficient documentation

## 2018-09-16 NOTE — Therapy (Signed)
[place], [place], [address] Phone: [phone]   Fax:  [phone]  Physical Therapy Evaluation  Patient Details  Name: Ryan Jenkins MRN: [id number] Date of Birth: [birth year] Referring Provider (PT): Karle Plumber ,MD   Encounter Date: 09/16/2018  PT End of Session - 09/16/18 1047    Visit Number  1    Number of Visits  12    Date for PT Re-Evaluation  10/30/18    Authorization Type  Self pay    PT Start Time  7017    PT Stop Time  1137    PT Time Calculation (min)  50 min    Activity Tolerance  Patient tolerated treatment well    Behavior During Therapy  [facility] for tasks assessed/performed       Past Medical History:  Diagnosis Date  . Diabetes mellitus without complication ([place])   . Hypertension     Past Surgical History:  Procedure Laterality Date  . CATARACT EXTRACTION Bilateral 2017    There were no vitals filed for this visit.   Subjective Assessment - 09/16/18 1055    Subjective  He reports  he has walking and balance issues . He reports peripoheral neuropaty.   Now on gaba pentin.    disturbance  for 6-7 years.  Reports numbness in feet.    Limitations  Walking;Standing    How long can you walk comfortably?  1 block max    Diagnostic tests  None    Patient Stated Goals  He wants to  be able to walk and balance better.    Pain Score  7     Pain Orientation  Right;Left    Pain Descriptors / Indicators  Burning;Numbness    Pain Type  Neuropathic pain    Pain Onset  More than a month ago    Pain Frequency  Constant         [facility] PT Assessment - 09/16/18 0001      Assessment   Medical Diagnosis  gait disturbance    Referring Provider (PT)  Karle Plumber ,MD    Onset Date/Surgical Date  --   years ago   Next MD Visit  later this month    Prior Therapy  No      Precautions   Precautions  Fall      Restrictions   Weight Bearing Restrictions  No      Balance Screen    Has the patient fallen in the past 6 months  Yes    How many times?  3    Has the patient had a decrease in activity level because of a fear of falling?   No    Is the patient reluctant to leave their home because of a fear of falling?   No      Home Environment   Living Environment  Private residence    Living Arrangements  Alone    Type of [place] to enter    Entrance Stairs-Number of Steps  5    Entrance Stairs-Rails  Right;Left;Can reach both    [place]  One level    [place] - single point;Walker - 2 wheels      Prior Function   Level of Independence  Needs assistance with ADLs   help with shopping   Vocation  Full time employment    Vocation Requirements  private nursing  Cognition   Overall Cognitive Status  Within Functional Limits for tasks assessed      ROM / Strength   AROM / PROM / Strength  AROM;PROM;Strength      AROM   Overall AROM Comments  WFL UE and LE      Strength   Strength Assessment Site  Hip;Knee;Ankle    Right/Left Hip  Right;Left    Right Hip Flexion  4+/5    Right Hip Extension  4+/5    Right Hip External Rotation   4+/5    Right Hip Internal Rotation  5/5    Right Hip ABduction  4+/5    Right Hip ADduction  4+/5    Left Hip Flexion  4+/5    Left Hip Extension  4+/5    Left Hip External Rotation  4+/5    Left Hip Internal Rotation  5/5    Left Hip ABduction  4+/5    Left Hip ADduction  4+/5    Right/Left Knee  Right;Left    Right Knee Flexion  5/5    Right Knee Extension  5/5    Left Knee Flexion  5/5    Left Knee Extension  5/5    Right/Left Ankle  Right;Left    Right Ankle Dorsiflexion  4/5    Right Ankle Plantar Flexion  3+/5    Right Ankle Inversion  4+/5    Right Ankle Eversion  4+/5    Left Ankle Dorsiflexion  4/5    Left Ankle Plantar Flexion  3+/5    Left Ankle Inversion  4+/5    Left Ankle Eversion  4+/5      Transfers   Five time sit to stand comments   30 seconds using  UE for assist. to stand      Standardized Balance Assessment   Standardized Balance Assessment  Berg Balance Test      Berg Balance Test   Sit to Stand  Able to stand  independently using hands    Standing Unsupported  Able to stand safely 2 minutes    Sitting with Back Unsupported but Feet Supported on Floor or Stool  Able to sit safely and securely 2 minutes    Stand to Sit  Controls descent by using hands    Transfers  Able to transfer safely, definite need of hands    Standing Unsupported with Eyes Closed  Able to stand 10 seconds safely    Standing Unsupported with Feet Together  Able to place feet together independently and stand for 1 minute with supervision    From Standing, Reach Forward with Outstretched Arm  Can reach forward >12 cm safely (5")    From Standing Position, Pick up Object from Floor  Able to pick up shoe safely and easily    From Standing Position, Turn to Look Behind Over each Shoulder  Turn sideways only but maintains balance    Turn 360 Degrees  Able to turn 360 degrees safely but slowly    Standing Unsupported, Alternately Place Feet on Step/Stool  Able to complete 4 steps without aid or supervision    Standing Unsupported, One Foot in Front  Able to take small step independently and hold 30 seconds    Standing on One Leg  Tries to lift leg/unable to hold 3 seconds but remains standing independently    Total Score  40    Berg comment:  40/56 indicates need for cane at all times and possible need for RW due to  decreased feeling in LE  for balance                 Objective measurements completed on examination: See above findings.              PT Education - 09/16/18 1148    Education Details  POC , results of assessment.    Person(s) Educated  Patient    Methods  Explanation    Comprehension  Verbalized understanding       PT Short Term Goals - 09/16/18 1047      PT SHORT TERM GOAL #1   Title  He will be independent with initial  hEP    Time  3    Period  Weeks    Status  New        PT Long Term Goals - 09/16/18 1047      PT LONG TERM GOAL #1   Title  He will be indpendent with all HEP issued    Time  6    Period  Weeks    Status  New      PT LONG TERM GOAL #2   Title  He will be able to walk 2 blocks without excessive fatigue    Time  6    Period  Weeks    Status  New      PT LONG TERM GOAL #3   Title  He will improve sit to stand time to 20 sec or better    Time  6    Period  Weeks    Status  New      PT LONG TERM GOAL #4   Title  He will improve BERG score to 45/56 to demo improved balance.    Time  6    Period  Weeks    Status  New      PT LONG TERM GOAL #5   Title  He will report greater balance and ease with work and home tasks.    Time  6    Period  Weeks    Status  New             Plan - 09/16/18 1149    Clinical Impression Statement  Mr Celmer has long history of gait disturbance with instability and falls.   This is related I think to neuropathy and weakness of ankles/lower legs as his general hip and thigh strength is good. [name]  He should be able to improve strength somewhat and balance but if neuroapthy is worsening  that feed back will be limited and continue to make him suseptible to falls. He should improve with skilled PT and consistent HEP    Personal Factors and Comorbidities  Past/Current Experience;Time since onset of injury/illness/exacerbation    Examination-Activity Limitations  Locomotion Level;Transfers;Squat;Stairs    Examination-Participation Restrictions  Community Activity   work and home tasks   Stability/Clinical Decision Making  Stable/Uncomplicated    Clinical Decision Making  Low    Rehab Potential  Good    PT Frequency  2x / week    PT Duration  6 weeks    PT Treatment/Interventions  Gait training;Stair training;Functional mobility training;Therapeutic activities;Therapeutic exercise;Balance training;Patient/family education    PT Next Visit Plan   initiate HEP for strength and conditioning of Le in geneal and ankles specifically.    balance   nustep    PT Home Exercise Plan  sit ti stand    Consulted and Agree with Plan of Care  Patient       Patient will benefit from skilled therapeutic intervention in order to improve the following deficits and impairments:  Difficulty walking, Decreased activity tolerance, Decreased balance, Decreased strength  Visit Diagnosis: Difficulty in walking, not elsewhere classified  Muscle weakness (generalized)  Unsteadiness on feet     Problem List Patient Active Problem List   Diagnosis Date Noted  . Gastroesophageal reflux disease without esophagitis 09/04/2018  . Diabetic retinopathy of both eyes associated with type 2 diabetes mellitus ([place]) 01/03/2018  . Intermittent diarrhea 09/27/2017  . Gastroparesis 09/27/2017  . Macroalbuminuric diabetic nephropathy ([place]) 06/25/2017  . History of falling 06/25/2017  . Hyperlipidemia 03/21/2017  . Vitamin B 12 deficiency 03/21/2017  . Uncontrolled type 2 diabetes mellitus with peripheral neuropathy ([place]) 02/05/2017  . Essential hypertension 02/05/2017  . Depression 02/05/2017  . Unintended weight loss 02/05/2017  . Gait disturbance 02/05/2017  . 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 Diabetic neuropathy, type II diabetes mellitus ([place]) 05/11/2014  . Metatarsal deformity 05/11/2014    Darrel Hoover  PT 09/16/2018, 11:57 AM  [facility] [address] [place], [place], [address] Phone: [phone]   Fax:  [phone]  Name: Ryan Jenkins MRN: [id number] Date of Birth: 1960-04-06

## 2018-09-18 ENCOUNTER — Telehealth: Payer: Self-pay | Admitting: Licensed Clinical Social Worker

## 2018-09-18 NOTE — Telephone Encounter (Signed)
Call placed to patient to follow up on [facility] referral. Pt agreed to schedule appointment for 10/02/2018.

## 2018-10-01 ENCOUNTER — Ambulatory Visit: Payer: Self-pay | Admitting: Internal Medicine

## 2018-10-02 ENCOUNTER — Institutional Professional Consult (permissible substitution): Payer: Self-pay | Admitting: Licensed Clinical Social Worker

## 2018-10-03 ENCOUNTER — Telehealth: Payer: Self-pay | Admitting: Licensed Clinical Social Worker

## 2018-10-03 ENCOUNTER — Institutional Professional Consult (permissible substitution): Payer: Self-pay | Admitting: Licensed Clinical Social Worker

## 2018-10-03 NOTE — Telephone Encounter (Signed)
Call placed to patient to initiate scheduled [facility] appointment. LCSW left a message for a return call.

## 2018-11-03 ENCOUNTER — Encounter: Payer: Self-pay | Admitting: *Deleted

## 2018-11-13 ENCOUNTER — Ambulatory Visit: Payer: Self-pay | Admitting: Internal Medicine

## 2018-11-25 ENCOUNTER — Encounter: Payer: Self-pay | Admitting: Nurse Practitioner

## 2018-11-25 ENCOUNTER — Ambulatory Visit: Payer: Self-pay | Admitting: Nurse Practitioner

## 2018-11-25 ENCOUNTER — Other Ambulatory Visit: Payer: Self-pay

## 2018-11-25 VITALS — BP 162/98 | HR 104 | Temp 98.7°F | Ht 74.0 in | Wt 234.0 lb

## 2018-11-25 DIAGNOSIS — R1012 Left upper quadrant pain: Secondary | ICD-10-CM

## 2018-11-25 DIAGNOSIS — K219 Gastro-esophageal reflux disease without esophagitis: Secondary | ICD-10-CM

## 2018-11-25 DIAGNOSIS — K5909 Other constipation: Secondary | ICD-10-CM

## 2018-11-25 DIAGNOSIS — M25475 Effusion, left foot: Secondary | ICD-10-CM

## 2018-11-25 DIAGNOSIS — M62838 Other muscle spasm: Secondary | ICD-10-CM

## 2018-11-25 DIAGNOSIS — G479 Sleep disorder, unspecified: Secondary | ICD-10-CM

## 2018-11-25 DIAGNOSIS — R5383 Other fatigue: Secondary | ICD-10-CM

## 2018-11-25 DIAGNOSIS — H539 Unspecified visual disturbance: Secondary | ICD-10-CM

## 2018-11-25 DIAGNOSIS — K5900 Constipation, unspecified: Secondary | ICD-10-CM

## 2018-11-25 DIAGNOSIS — R197 Diarrhea, unspecified: Secondary | ICD-10-CM

## 2018-11-25 DIAGNOSIS — R109 Unspecified abdominal pain: Secondary | ICD-10-CM

## 2018-11-25 DIAGNOSIS — R112 Nausea with vomiting, unspecified: Secondary | ICD-10-CM

## 2018-11-25 DIAGNOSIS — R630 Anorexia: Secondary | ICD-10-CM

## 2018-11-25 DIAGNOSIS — R194 Change in bowel habit: Secondary | ICD-10-CM

## 2018-11-25 DIAGNOSIS — Z1159 Encounter for screening for other viral diseases: Secondary | ICD-10-CM

## 2018-11-25 HISTORY — DX: Anorexia: R63.0

## 2018-11-25 HISTORY — DX: Other muscle spasm: M62.838

## 2018-11-25 HISTORY — DX: Sleep disorder, unspecified: G47.9

## 2018-11-25 HISTORY — DX: Gastro-esophageal reflux disease without esophagitis: K21.9

## 2018-11-25 HISTORY — DX: Unspecified abdominal pain: R10.9

## 2018-11-25 HISTORY — DX: Change in bowel habit: R19.4

## 2018-11-25 HISTORY — DX: Other fatigue: R53.83

## 2018-11-25 HISTORY — DX: Diarrhea, unspecified: R19.7

## 2018-11-25 HISTORY — DX: Unspecified visual disturbance: H53.9

## 2018-11-25 HISTORY — DX: Effusion, left foot: M25.475

## 2018-11-25 HISTORY — DX: Constipation, unspecified: K59.00

## 2018-11-25 MED ORDER — LUBIPROSTONE 8 MCG PO CAPS: 8.0000 ug | ORAL_CAPSULE | Freq: Two times a day (BID) | ORAL | 3 refills | Status: DC

## 2018-11-25 NOTE — Progress Notes (Signed)
ASSESSMENT / PLAN:   1. 58 yo male with several month history of left sided abdominal pain. There does seem to be a musculoskeletal component to the L UQ pain but not so much LLQ pain.  Pain often associated with nausea and vomiting.  Patient gives a history of constipation though he has increased frequency of bowel movements associated with above episodes of pain. He had a normal gastric emptying study last year. -Better glucose control may help -Will scheduled for EGD for evaluation of frequent nausea, vomiting, LUQ pain.  He does take ibuprofen a few times a week so rule out PUD. The risks and benefits of EGD were discussed and the patient agrees to proceed.   2. Bowel changes.  He reports chronic constipation though often has several bowel movements during above episodes of abdominal pain / nausea / vomiting.  Failed Miralax in past.  -Trial of Amitiza 8 mcg BID  3. GERD.  Has frequent heartburn and regurgitation despite a.m. PPI. -Antireflux measures discussed.   -Will add Pepcid at bedtime  HPI:    Referring Provider:  Karle Plumber, MD     Reason for referral:  Abdominal pain    Chief Complaint:   Abdominal pain  Patient is a 58 year old male with PMH significant for hypertension, DM 2, peripheral neuropathy, GERD, chronic nausea, chronic constipation.  He gives a 6-8 month history of intermittent abdominal pain. Describes a sharp pain in both LUQ and LLQ though not always simultaneous.  Neither pain has any relationship to food nor bowel movements. The LUQ pain is sometimes aggravated by bending over.  He experiences one or both abdominal pains at least 2-3 times a week and pain often associated with nausea / vomiting. With these episodes of abdominal pain / nausea / vomiting he frequently experiences increased frequency of bowel movements. . Initial BM will be hard, it is followed by more liquid stools. . After several BMs patient will not have another BM for  several days.  He has chronic constipation and took Miralax for a long time. He has also tried fiber supplements .Most recently he has been relying on Mg+ Citrate a couple of times a week.  Patient says he had a normal colonoscopy by [facility] approximately 4 years ago, told to return in 10 years  Data Reviewed:  07/31/2018 alk phos, 118 o/w normal liver tests  10/03/2017 4 hour Gastric emptying study normal   Past Medical History:  Diagnosis Date  . Acid reflux 11/25/2018  . Anxiety   . Bilateral swelling of feet and ankles 11/25/2018   and legs  . Change in bowel habits 11/25/2018  . Constipation 11/25/2018  . Depression   . Diabetic neuropathy ([place])   . GERD (gastroesophageal reflux disease)   . Hypertension   . Muscle spasm 11/25/2018  . 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 Sleep disturbances 11/25/2018  . Vision changes 11/25/2018  . Vitamin B12 deficiency      Past Surgical History:  Procedure Laterality Date  . CATARACT EXTRACTION Bilateral 2017   Family History  Problem Relation Age of Onset  . Renal cancer Mother   . Hypertension Mother   . Pancreatic cancer Mother   . Hypertension Sister   . Stroke Sister   . Leukemia Maternal Uncle   . Sickle cell trait Maternal Aunt    Social History   Tobacco Use  .  Smoking status: Never Smoker  . Smokeless tobacco: Never Used  Substance Use Topics  . Alcohol use: No    Alcohol/week: 0.0 standard drinks  . Drug use: No   Current Outpatient Medications  Medication Sig Dispense Refill  . amLODipine (NORVASC) 10 MG tablet Take 1 tablet (10 mg total) by mouth daily. 30 tablet 6  . atorvastatin (LIPITOR) 40 MG tablet Take 1 tablet (40 mg total) by mouth daily. 30 tablet 6  . Blood Glucose Monitoring Suppl (TRUE METRIX METER) w/Device KIT Use as directed 1 kit 0  . cyclobenzaprine (FLEXERIL) 5 MG tablet Take 1 tablet (5 mg total) by mouth 2 (two) times daily as needed for muscle spasms (medication can cause  drowsiness). 15 tablet 0  . dapagliflozin propanediol (FARXIGA) 10 MG TABS tablet Take 10 mg by mouth daily. 30 tablet 2  . escitalopram (LEXAPRO) 20 MG tablet Take 1 tablet (20 mg total) by mouth daily. 30 tablet 6  . gabapentin (NEURONTIN) 300 MG capsule TAKE 2 CAPSULES BY MOUTH 3 TIMES DAILY. 180 capsule 6  . glucose blood test strip Use as instructed 100 each 12  . Insulin Glargine (LANTUS SOLOSTAR) 100 UNIT/ML Solostar Pen Inject 46 Units into the skin daily at 10 pm. 5 pen 3  . insulin lispro (HUMALOG KWIKPEN) 100 UNIT/ML KwikPen 14 units subcu before breakfast and lunch, 17 units with dinner. 15 mL 11  . Insulin Pen Needle (PEN NEEDLES) 31G X 8 MM MISC Use as directed 100 each 6  . lisinopril (ZESTRIL) 10 MG tablet Take 2 tablets (20 mg total) by mouth daily. 60 tablet 2  . ondansetron (ZOFRAN) 4 MG tablet Take 1 tablet (4 mg total) by mouth every 8 (eight) hours as needed for nausea or vomiting. 20 tablet 0  . pantoprazole (PROTONIX) 20 MG tablet Take 1 tablet (20 mg total) by mouth daily. 30 tablet 6  . TRUEPLUS LANCETS 28G MISC Use as directed 100 each 1  . vitamin B-12 (CYANOCOBALAMIN) 1000 MCG tablet Take 1 tablet (1,000 mcg total) by mouth daily. 100 tablet 3   No current facility-administered medications for this visit.    Allergies  Allergen Reactions  . Claritin [Loratadine] Swelling    Joint swelling   . Hydrochlorothiazide     Dizziness  . Lyrica [Pregabalin]     depression  . Metformin And Related     GI    Review of Systems: All systems reviewed and negative except where noted in HPI.   Physical Exam:    Wt Readings from Last 3 Encounters:  11/25/18 234 lb (106.1 kg)  03/06/18 214 lb (97.1 kg)  01/03/18 229 lb 9.6 oz (104.1 kg)    BP (!) 162/98 (BP Location: Left Arm, Patient Position: Sitting, Cuff Size: Large)   Pulse (!) 104   Temp 98.7 F (37.1 C) (Oral)   Ht _0  (1.88 m)   Wt 234 lb (106.1 kg)   BMI 30.04 kg/m  Constitutional:  Pleasant male  in no acute distress. Psychiatric: Normal mood and affect. Behavior is normal. EENT: Pupils normal.  Conjunctivae are normal. No scleral icterus. Neck supple.  Cardiovascular: Normal rate, regular rhythm. No edema Pulmonary/chest: Effort normal and breath sounds normal. No wheezing, rales or rhonchi. Abdominal: Soft, nondistended, nontender. Bowel sounds active throughout. There are no masses palpable. No hepatomegaly. Neurological: Alert and oriented to person place and time. Skin: Skin is warm and dry. No rashes noted.  Tye Savoy, NP  11/25/2018, 10:16 AM  Cc: Ladell Pier, MD

## 2018-11-25 NOTE — Patient Instructions (Addendum)
If you are age 58 or older, your body mass index should be between 23-30. Your Body mass index is 30.04 kg/m. If this is out of the aforementioned range listed, please consider follow up with your Primary Care Provider.  If you are age 66 or younger, your body mass index should be between 19-25. Your Body mass index is 30.04 kg/m. If this is out of the aformentioned range listed, please consider follow up with your Primary Care Provider.   You have been scheduled for an endoscopy. Please follow written instructions given to you at your visit today. If you use inhalers (even only as needed), please bring them with you on the day of your procedure. Your physician has requested that you go to www.startemmi.com and enter the access code given to you at your visit today. This web site gives a general overview about your procedure. However, you should still follow specific instructions given to you by our office regarding your preparation for the procedure.  We have sent the following medications to your pharmacy for you to pick up at your convenience: Amitiza 8 mcg twice daily.  Add Pepcid 20 mg at bedtime.  This is over-the counter.  Drink 64 ounces of water daily.  You have been given GERD literature.  We are requesting records from [facility].  Follow up with me after EGD.  Please call for an appointment.  Thank you for choosing me and [facility].   Tye Savoy, NP

## 2018-11-26 ENCOUNTER — Encounter: Payer: Self-pay | Admitting: Nurse Practitioner

## 2018-11-27 NOTE — Progress Notes (Signed)
Reviewed and agree with management plans. ? ?Ryan Ledlow L. Cochise Dinneen, MD, MPH  ?

## 2018-12-03 ENCOUNTER — Telehealth: Payer: Self-pay | Admitting: Gastroenterology

## 2018-12-03 MED ORDER — LUBIPROSTONE 8 MCG PO CAPS: 8.0000 ug | ORAL_CAPSULE | Freq: Two times a day (BID) | ORAL | 3 refills | Status: DC

## 2018-12-03 MED FILL — AMITIZA 8 MCG CAPSULE: 8 | 30 days supply | Qty: 60 | Fill #0

## 2018-12-03 NOTE — Telephone Encounter (Signed)
[facility]  

## 2018-12-05 ENCOUNTER — Other Ambulatory Visit: Payer: Self-pay | Admitting: Gastroenterology

## 2018-12-05 ENCOUNTER — Ambulatory Visit (INDEPENDENT_AMBULATORY_CARE_PROVIDER_SITE_OTHER): Payer: Self-pay

## 2018-12-05 DIAGNOSIS — Z1159 Encounter for screening for other viral diseases: Secondary | ICD-10-CM

## 2018-12-08 LAB — SARS CORONAVIRUS 2 (TAT 6-24 HRS): SARS Coronavirus 2: NEGATIVE

## 2018-12-09 ENCOUNTER — Ambulatory Visit: Payer: Self-pay | Admitting: Internal Medicine

## 2018-12-09 ENCOUNTER — Encounter: Payer: Self-pay | Admitting: Gastroenterology

## 2018-12-09 ENCOUNTER — Other Ambulatory Visit: Payer: Self-pay

## 2018-12-09 ENCOUNTER — Ambulatory Visit (AMBULATORY_SURGERY_CENTER): Payer: Self-pay | Admitting: Gastroenterology

## 2018-12-09 VITALS — BP 156/92 | HR 84 | Temp 98.3°F | Resp 9 | Ht 74.0 in | Wt 234.0 lb

## 2018-12-09 DIAGNOSIS — K259 Gastric ulcer, unspecified as acute or chronic, without hemorrhage or perforation: Secondary | ICD-10-CM

## 2018-12-09 DIAGNOSIS — R112 Nausea with vomiting, unspecified: Secondary | ICD-10-CM

## 2018-12-09 DIAGNOSIS — K3189 Other diseases of stomach and duodenum: Secondary | ICD-10-CM

## 2018-12-09 DIAGNOSIS — K2980 Duodenitis without bleeding: Secondary | ICD-10-CM

## 2018-12-09 DIAGNOSIS — K2950 Unspecified chronic gastritis without bleeding: Secondary | ICD-10-CM

## 2018-12-09 DIAGNOSIS — K2970 Gastritis, unspecified, without bleeding: Secondary | ICD-10-CM

## 2018-12-09 MED ORDER — SODIUM CHLORIDE 0.9 % IV SOLN: 500.0000 mL | Freq: Once | INTRAVENOUS | Status: DC

## 2018-12-09 MED ORDER — PANTOPRAZOLE SODIUM 40 MG PO TBEC: 40.0000 mg | DELAYED_RELEASE_TABLET | Freq: Two times a day (BID) | ORAL | 0 refills | Status: DC

## 2018-12-09 NOTE — Op Note (Signed)
[facility] Patient Name: Ryan Jenkins Procedure Date: 12/09/2018 9:34 AM MRN: [id number] Endoscopist: Thornton Park MD, MD Age: 58 Referring MD:  Date of Birth: 05/29/1960 Gender: Male Account #: [id number] Procedure:                Upper GI endoscopy Indications:              left-sided Abdominal pain, Nausea with vomiting Medicines:                Monitored Anesthesia Care Procedure:                Pre-Anesthesia Assessment:                           - Prior to the procedure, a History and Physical                            was performed, and patient medications and                            allergies were reviewed. The patient's tolerance of                            previous anesthesia was also reviewed. The risks                            and benefits of the procedure and the sedation                            options and risks were discussed with the patient.                            All questions were answered, and informed consent                            was obtained. Prior Anticoagulants: The patient has                            taken no previous anticoagulant or antiplatelet                            agents. ASA Grade Assessment: III - A patient with                            severe systemic disease. After reviewing the risks                            and benefits, the patient was deemed in                            satisfactory condition to undergo the procedure.                           After obtaining informed consent, the endoscope was  passed under direct vision. Throughout the                            procedure, the patient's blood pressure, pulse, and                            oxygen saturations were monitored continuously. The                            Endoscope was introduced through the mouth, and                            advanced to the third part of duodenum. The upper   GI endoscopy was accomplished without difficulty.                            The patient tolerated the procedure well. Scope In: Scope Out: Findings:                 The examined esophagus was normal. Biopsies were                            obtained from the proximal and distal esophagus                            with cold forceps for histology of suspected                            eosinophilic esophagitis.                           Many non-bleeding linear gastric ulcers with no                            stigmata of bleeding were found in the gastric body                            and in the gastric antrum. Biopsies were taken from                            the antrum, body, and fundus with a cold forceps                            for histology. Estimated blood loss was minimal.                           Diffuse moderately erythematous mucosa without                            active bleeding and with no stigmata of bleeding                            was found in the duodenal bulb. Biopsies were taken  with a cold forceps for histology. Estimated blood                            loss was minimal.                           The cardia and gastric fundus were normal on                            retroflexion.                           The exam was otherwise without abnormality. Complications:            No immediate complications. Estimated blood loss:                            Minimal. Estimated Blood Loss:     Estimated blood loss was minimal. Impression:               - Normal esophagus. Biopsied.                           - Non-bleeding gastric ulcers with no stigmata of                            bleeding. Biopsied.                           - Erythematous duodenopathy. Biopsied.                           - The examination was otherwise normal. Recommendation:           - Patient has a contact number available for                             emergencies. The signs and symptoms of potential                            delayed complications were discussed with the                            patient. Return to normal activities tomorrow.                            Written discharge instructions were provided to the                            patient.                           - Resume previous diet.                           - Continue present medications.                           -  Start pantoprazole 40 mg twice daily for 8 weeks.                           - Avoid all NSAIDs.                           - Await pathology results.                           - Follow-up with Dr. Tarri Glenn or Ms. Guenther in 8                            weeks, or earlier as needed. Thornton Park MD, MD 12/09/2018 9:58:33 AM This report has been signed electronically.

## 2018-12-09 NOTE — Progress Notes (Signed)
[facility] Temperature- June Bullock

## 2018-12-09 NOTE — Patient Instructions (Signed)
HANDOUTS PROVIDED ON: GASTRITIS   THE BIOPSIES TAKEN TODAY HAVE BEEN SENT FOR PATHOLOGY.  THE RESULTS CAN TAKE 2-3 WEEKS TO RECEIVE.   YOU MAY RESUME YOUR PREVIOUS DIET AND PRESENT MEDICATIONS.  START TAKING PANTOPRAZOLE 40mg  TWICE DAILY FOR 8 WEEKS.  AVOID ALL NSAIDS.  [place] YOU FOR ALLOWING [name] TO CARE FOR YOU TODAY!!!  YOU HAD AN ENDOSCOPIC PROCEDURE TODAY AT [facility]:   Refer to the procedure report that was given to you for any specific questions about what was found during the examination.  If the procedure report does not answer your questions, please call your gastroenterologist to clarify.  If you requested that your care partner not be given the details of your procedure findings, then the procedure report has been included in a sealed envelope for you to review at your convenience later.  Please Note:  You might notice some irritation and congestion in your nose or some drainage.  This is from the oxygen used during your procedure.  There is no need for concern and it should clear up in a day or so.  SYMPTOMS TO REPORT IMMEDIATELY:   Following upper endoscopy (EGD)  Vomiting of blood or coffee ground material  New chest pain or pain under the shoulder blades  Painful or persistently difficult swallowing  New shortness of breath  Fever of 100F or higher  Black, tarry-looking stools  For urgent or emergent issues, a gastroenterologist can be reached at any hour by calling [phone].   DIET:  We do recommend a small meal at first, but then you may proceed to your regular diet.  Drink plenty of fluids but you should avoid alcoholic beverages for 24 hours.  ACTIVITY:  You should plan to take it easy for the rest of today and you should NOT DRIVE or use heavy machinery until tomorrow (because of the sedation medicines used during the test).    FOLLOW UP: Our staff will call the number listed on your records 48-72 hours following your procedure to check on  you and address any questions or concerns that you may have regarding the information given to you following your procedure. If we do not reach you, we will leave a message.  We will attempt to reach you two times.  During this call, we will ask if you have developed any symptoms of COVID 19. If you develop any symptoms (ie: fever, flu-like symptoms, shortness of breath, cough etc.) before then, please call [phone].  If you test positive for Covid 19 in the 2 weeks post procedure, please call and report this information to [name].    If any biopsies were taken you will be contacted by phone or by letter within the next 1-3 weeks.  Please call us at [phone] if you have not heard about the biopsies in 3 weeks.    SIGNATURES/CONFIDENTIALITY: You and/or your care partner have signed paperwork which will be entered into your electronic medical record.  These signatures attest to the fact that that the information above on your After Visit Summary has been reviewed and is understood.  Full responsibility of the confidentiality of this discharge information lies with you and/or your care-partner.

## 2018-12-09 NOTE — Progress Notes (Signed)
Called to room to assist during endoscopic procedure.  Patient ID and intended procedure confirmed with present staff. Received instructions for my participation in the procedure from the performing physician.  

## 2018-12-09 NOTE — Progress Notes (Signed)
To PACU, VSS. Report to [email or web address] 

## 2018-12-10 ENCOUNTER — Encounter: Payer: Self-pay | Admitting: *Deleted

## 2018-12-10 ENCOUNTER — Other Ambulatory Visit: Payer: Self-pay | Admitting: Internal Medicine

## 2018-12-15 ENCOUNTER — Telehealth: Payer: Self-pay

## 2018-12-15 NOTE — Telephone Encounter (Signed)
  Follow up Call-  Call back number 12/09/2018  Post procedure Call Back phone  # [phone]  Permission to leave phone message Yes  Some recent data might be hidden     Patient questions:  Do you have a fever, pain , or abdominal swelling? No. Pain Score  0 *  Have you tolerated food without any problems? Yes.    Have you been able to return to your normal activities? Yes.    Do you have any questions about your discharge instructions: Diet   No. Medications  No. Follow up visit  No.  Do you have questions or concerns about your Care? No.  Actions: * If pain score is 4 or above: No action needed, pain <4. 1. Have you developed a fever since your procedure? no  2.   Have you had an respiratory symptoms (SOB or cough) since your procedure? no  3.   Have you tested positive for COVID 19 since your procedure no  4.   Have you had any family members/close contacts diagnosed with the COVID 19 since your procedure?  no   If yes to any of these questions please route to Joylene John, RN and Alphonsa Gin, Therapist, sports.

## 2018-12-22 ENCOUNTER — Encounter: Payer: Self-pay | Admitting: Gastroenterology

## 2018-12-25 ENCOUNTER — Encounter: Payer: Self-pay | Admitting: Internal Medicine

## 2018-12-25 ENCOUNTER — Ambulatory Visit (HOSPITAL_BASED_OUTPATIENT_CLINIC_OR_DEPARTMENT_OTHER): Payer: Self-pay | Admitting: Pharmacist

## 2018-12-25 ENCOUNTER — Ambulatory Visit: Payer: Self-pay | Attending: Internal Medicine | Admitting: Internal Medicine

## 2018-12-25 ENCOUNTER — Other Ambulatory Visit: Payer: Self-pay

## 2018-12-25 VITALS — BP 138/90 | HR 99 | Temp 98.3°F | Resp 16 | Wt 226.8 lb

## 2018-12-25 DIAGNOSIS — F419 Anxiety disorder, unspecified: Secondary | ICD-10-CM

## 2018-12-25 DIAGNOSIS — R1110 Vomiting, unspecified: Secondary | ICD-10-CM

## 2018-12-25 DIAGNOSIS — F329 Major depressive disorder, single episode, unspecified: Secondary | ICD-10-CM

## 2018-12-25 DIAGNOSIS — N1831 Chronic kidney disease, stage 3a: Secondary | ICD-10-CM

## 2018-12-25 DIAGNOSIS — K259 Gastric ulcer, unspecified as acute or chronic, without hemorrhage or perforation: Secondary | ICD-10-CM | POA: Insufficient documentation

## 2018-12-25 DIAGNOSIS — I10 Essential (primary) hypertension: Secondary | ICD-10-CM

## 2018-12-25 DIAGNOSIS — E1142 Type 2 diabetes mellitus with diabetic polyneuropathy: Secondary | ICD-10-CM

## 2018-12-25 DIAGNOSIS — Z23 Encounter for immunization: Secondary | ICD-10-CM

## 2018-12-25 DIAGNOSIS — IMO0002 Reserved for concepts with insufficient information to code with codable children: Secondary | ICD-10-CM

## 2018-12-25 DIAGNOSIS — N183 Chronic kidney disease, stage 3 unspecified: Secondary | ICD-10-CM | POA: Insufficient documentation

## 2018-12-25 DIAGNOSIS — E1165 Type 2 diabetes mellitus with hyperglycemia: Secondary | ICD-10-CM

## 2018-12-25 DIAGNOSIS — F32A Depression, unspecified: Secondary | ICD-10-CM

## 2018-12-25 DIAGNOSIS — E11319 Type 2 diabetes mellitus with unspecified diabetic retinopathy without macular edema: Secondary | ICD-10-CM

## 2018-12-25 HISTORY — DX: Gastric ulcer, unspecified as acute or chronic, without hemorrhage or perforation: K25.9

## 2018-12-25 LAB — GLUCOSE, POCT (MANUAL RESULT ENTRY): POC Glucose: 345 mg/dl — AB (ref 70–99)

## 2018-12-25 LAB — POCT GLYCOSYLATED HEMOGLOBIN (HGB A1C): HbA1c, POC (controlled diabetic range): 11.8 % — AB (ref 0.0–7.0)

## 2018-12-25 NOTE — Progress Notes (Signed)
Patient ID: Ryan Jenkins, male    DOB: 04-14-1960  MRN: [id number]  CC: Diabetes and Hypertension   Subjective: Ryan Jenkins is a 58 y.o. male who presents for chronic disease management His concerns today include:  Pt with hx of DM withretinopathy,neuropathy (in feet and hands), macroalbuminuria,HTN, HL,anxiety,Vit B12 def chronic LBP.  Patient does not have his medications with him today.  Referred to GI on last visit for recurrent vomiting.  Saw Dr. Tarri Glenn and had EGD done.  This revealed nonbleeding gastric ulcers that were negative for H. pylori.  Patient states he was told to stop aspirin.  Protonix dose was increased to 40 mg twice a day.  However he thinks he is still taking only 20 mg a day.  Amitza was added to help with chronic constipation. -He tells me today that he feels the acid in his stomach is getting really bad and he continues to have intermittent vomiting with diarrhea.  Of note he is taking Amitza This morning - vomiting and diarrhea getting worse, acid in my stomach is really bad  DIABETES TYPE 2 Last A1C:   Results for orders placed or performed in visit on 12/25/18  Glucose (CBG)  Result Value Ref Range   POC Glucose 345 (A) 70 - 99 mg/dl  HgB A1c  Result Value Ref Range   Hemoglobin A1C     HbA1c POC (<> result, manual entry)     HbA1c, POC (prediabetic range)     HbA1c, POC (controlled diabetic range) 11.8 (A) 0.0 - 7.0 %    Med Adherence:  []  Yes    [x]  No -he admits that he has not been taking his medications the way he should because he has been dealing with this stomach issue.  Some days he has vomiting and diarrhea.  He is supposed to be on Lantus, Humalog and Farxiga, stomach too upset with vomiting and diarrhea.  He states that he plans to get back on his medications Medication side effects:  []  Yes    []  No Home Monitoring?  []  Yes    []  No Home glucose results range: Diet Adherence: eating apple sauce, fruit and green veggie  smoothies (apple, carrot juice, bananna) Exercise: []  Yes    [x]  No Hypoglycemic episodes?: []  Yes    [x]  No Numbness of the feet? [x]  Yes    []  No Retinopathy hx? [x]  Yes    []  No Last eye exam: - not able to reach [facility] and wants # again Comments:   HTN: no device to check blood pressure.  Took meds already for morning that includes amlodipine, lisinopril 20 mg.  He limits salt in the foods. Patient Active Problem List   Diagnosis Date Noted   Gastroesophageal reflux disease without esophagitis 09/04/2018   Diabetic retinopathy of both eyes associated with type 2 diabetes mellitus ([place]) 01/03/2018   Intermittent diarrhea 09/27/2017   Gastroparesis 09/27/2017   Macroalbuminuric diabetic nephropathy ([place]) 06/25/2017   History of falling 06/25/2017   Hyperlipidemia 03/21/2017   Vitamin B 12 deficiency 03/21/2017   Uncontrolled type 2 diabetes mellitus with peripheral neuropathy ([place]) 02/05/2017   Essential hypertension 02/05/2017   Depression 02/05/2017   Unintended weight loss 02/05/2017   Gait disturbance 02/05/2017   Pronation deformity of both feet 05/11/2014   Diabetic neuropathy, type II diabetes mellitus ([place]) 05/11/2014   Metatarsal deformity 05/11/2014     Current Outpatient Medications on File Prior to Visit  Medication Sig Dispense Refill  amLODipine (NORVASC) 10 MG tablet Take 1 tablet (10 mg total) by mouth daily. 30 tablet 6   atorvastatin (LIPITOR) 40 MG tablet Take 1 tablet (40 mg total) by mouth daily. 30 tablet 6   Blood Glucose Monitoring Suppl (TRUE METRIX METER) w/Device KIT Use as directed 1 kit 0   cyclobenzaprine (FLEXERIL) 5 MG tablet TAKE 1 TABLET (5 MG TOTAL) BY MOUTH 2 (TWO) TIMES DAILY AS NEEDED FOR MUSCLE SPASMS (MEDICATION CAN CAUSE DROWSINESS). 15 tablet 0   dapagliflozin propanediol (FARXIGA) 10 MG TABS tablet Take 10 mg by mouth daily. 30 tablet 2   escitalopram (LEXAPRO) 20 MG tablet Take 1 tablet (20 mg total) by  mouth daily. 30 tablet 6   gabapentin (NEURONTIN) 300 MG capsule TAKE 2 CAPSULES BY MOUTH 3 TIMES DAILY. 180 capsule 6   glucose blood test strip Use as instructed 100 each 12   Insulin Glargine (LANTUS SOLOSTAR) 100 UNIT/ML Solostar Pen Inject 46 Units into the skin daily at 10 pm. 5 pen 3   insulin lispro (HUMALOG KWIKPEN) 100 UNIT/ML KwikPen 14 units subcu before breakfast and lunch, 17 units with dinner. 15 mL 11   Insulin Pen Needle (PEN NEEDLES) 31G X 8 MM MISC Use as directed 100 each 6   lisinopril (ZESTRIL) 10 MG tablet Take 2 tablets (20 mg total) by mouth daily. 60 tablet 2   ondansetron (ZOFRAN) 4 MG tablet Take 1 tablet (4 mg total) by mouth every 8 (eight) hours as needed for nausea or vomiting. 20 tablet 0   pantoprazole (PROTONIX) 40 MG tablet Take 1 tablet (40 mg total) by mouth 2 (two) times daily. 112 tablet 0   TRUEPLUS LANCETS 28G MISC Use as directed 100 each 1   vitamin B-12 (CYANOCOBALAMIN) 1000 MCG tablet Take 1 tablet (1,000 mcg total) by mouth daily. 100 tablet 3   No current facility-administered medications on file prior to visit.    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, Editor, commissioning  Tobacco Use   Smoking status: Never Smoker   Smokeless tobacco: Never Used  Substance and Sexual Activity   Alcohol use: No    Alcohol/week: 0.0 standard drinks   Drug use: No   Sexual activity: Yes  Other Topics Concern   Not on file  Social History Narrative   Not on file   Social Determinants of Health   Financial Resource Strain:      Difficulty of Paying Living Expenses: Not on file  Food Insecurity:    Worried About Charity fundraiser in the Last Year: Not on file   [employer] of Food in the Last Year: Not on file  Transportation Needs:    Lack of Transportation (Medical): Not on file   Lack of Transportation (Non-Medical): Not on file  Physical Activity:    Days of Exercise per Week: Not on file   Minutes of Exercise per Session: Not on file  Stress:    Feeling of Stress :  Not on file  Social Connections:    Frequency of Communication with Friends and Family: Not on file   Frequency of Social Gatherings with Friends and Family: Not on file   Attends Religious Services: Not on file   Active Member of Clubs or Organizations: Not on file   Attends Archivist Meetings: Not on file   Marital Status: Not on file  Intimate Partner Violence:    Fear of Current or Ex-Partner: Not on file   Emotionally Abused: Not on file   Physically Abused: Not on file   Sexually Abused: Not on file    Family History  Problem Relation Age of Onset   Renal cancer Mother    Hypertension Mother    Pancreatic cancer Mother    Hypertension Sister    Stroke Sister    Leukemia Maternal Uncle    Sickle cell trait Maternal Aunt    Colon cancer Neg Hx    Esophageal cancer Neg Hx    Rectal cancer Neg Hx    Stomach cancer Neg Hx     Past Surgical History:  Procedure Laterality Date   CATARACT EXTRACTION Bilateral 2017    ROS: Review of Systems Negative except as stated above  PHYSICAL EXAM: BP 138/90    Pulse 99    Temp 98.3 F (36.8 C) (Oral)    Resp 16    Wt 226 lb 12.8 oz (102.9 kg)    SpO2 100%    BMI 29.12 kg/m   Physical Exam  General appearance - alert, well appearing, and in no distress Mental status - normal mood, behavior, speech, dress, motor activity, and thought processes Chest - clear to auscultation, no wheezes, rales or rhonchi, symmetric air entry Heart - normal  rate, regular rhythm, normal S1, S2, no murmurs, rubs, clicks or gallops Abdomen - soft, nontender, nondistended, no masses or organomegaly Extremities - peripheral pulses normal, no pedal edema, no clubbing or cyanosis  CMP Latest Ref Rng & Units 07/31/2018 07/03/2018 02/25/2018  Glucose 65 - 99 mg/dL - 216(H) 429(H)  BUN 6 - 24 mg/dL - 13 13  Creatinine 0.76 - 1.27 mg/dL - 1.63(H) 1.47(H)  Sodium 134 - 144 mmol/L - 140 132(L)  Potassium 3.5 - 5.2 mmol/L - 4.1 4.3  Chloride 96 - 106 mmol/L - 101 95(L)  CO2 20 - 29 mmol/L - 24 24  Calcium 8.7 - 10.2 mg/dL - 9.1 9.4  Total Protein 6.0 - 8.5 g/dL 7.7 - -  Total Bilirubin 0.0 - 1.2 mg/dL 0.3 - -  Alkaline Phos 39 - 117 IU/L 118(H) - -  AST 0 - 40 IU/L 19 - -  ALT 0 - 44 IU/L 25 - -   Lipid Panel     Component Value Date/Time   CHOL 172 02/25/2018 1657   TRIG 158 (H) 02/25/2018 1657   HDL 39 (L) 02/25/2018 1657   CHOLHDL 4.4 02/25/2018 1657   LDLCALC 101 (H) 02/25/2018 1657    CBC    Component Value Date/Time   WBC 7.7 08/07/2017 1424   WBC 6.5 06/18/2017 1517   RBC 4.43 08/07/2017 1424   RBC 4.31 06/18/2017 1517   HGB 13.3 08/07/2017 1424   HCT 40.6 08/07/2017 1424   PLT 335 08/07/2017 1424   MCV 92 08/07/2017 1424   MCH 30.0 08/07/2017 1424   MCH 30.4 06/18/2017 1517   MCHC 32.8 08/07/2017 1424   MCHC 34.7 06/18/2017 1517   RDW 14.5 08/07/2017 1424   LYMPHSABS  2.6 08/07/2017 1424   EOSABS 0.2 08/07/2017 1424   BASOSABS 0.0 08/07/2017 1424    ASSESSMENT AND PLAN:  1. Uncontrolled type 2 diabetes mellitus with peripheral neuropathy ([place]) -Patient not compliant with medications.  He plans to get back on his medicines and improve on his eating habits. -Advised to check blood sugars at least twice a day before meals and record the readings.  I will give him a follow-up appointment with the clinical pharmacist in 1 month for recheck - Glucose (CBG) - HgB A1c - Comprehensive metabolic panel - Microalbumin / creatinine  urine ratio  2. Diabetic retinopathy of both eyes associated with type 2 diabetes mellitus, macular edema presence unspecified, unspecified retinopathy severity ([place]) I will resubmit referral to [facility] - Ambulatory referral to Ophthalmology  3. Essential hypertension Not at goal.  I wanted to increase the lisinopril to 30 mg but I will hold off until I see what his kidney function looks like  4. Gastric ulcer without hemorrhage or perforation, unspecified chronicity Went over a list of NSAIDs and told him not to take any of them.  He will start taking Protonix 40 mg twice a day.  He will keep follow-up appointment with gastroenterologist that is coming up  5. Stage 3a chronic kidney disease Avoid NSAIDs If kidney function is stable we can increase the lisinopril if not we will add a different blood pressure medication - Comprehensive metabolic panel - Microalbumin / creatinine urine ratio  6. Anxiety and depression -Our LCSW tried to reach out to him since last visit.  He continues to take the Lexapro  7. Recurrent vomiting For completeness I will do an ultrasound to look at the gallbladder but I think his symptoms are related to diabetes -I have told him to stop the Amitza given that he is having diarrhea - US Abdomen Limited RUQ; Future    Patient was given the opportunity to ask questions.  Patient verbalized understanding of the plan and was able to repeat key elements of the plan.   Orders Placed This Encounter  Procedures   US Abdomen Limited RUQ   Comprehensive metabolic panel   Microalbumin / creatinine urine ratio   Ambulatory referral to Ophthalmology   Glucose (CBG)   HgB A1c     Requested Prescriptions    No prescriptions requested or ordered in this encounter    Return in about 3 months (around 03/25/2019).  Karle Plumber, MD, FACP

## 2018-12-25 NOTE — Progress Notes (Signed)
Patient presents for vaccination against influenza per orders of Dr. Johnson. Consent given. Counseling provided. No contraindications exists. Vaccine administered without incident.   

## 2018-12-25 NOTE — Patient Instructions (Addendum)
Please give patient an appointment with the clinical pharmacist in 1 month for recheck of blood sugars.  Please check your blood sugars twice a day before meals and record your readings.  Bring those in with you on your next visit.  Your blood pressure is not at goal.  I would like to increase the lisinopril but I will hold off on doing so until I see what your kidney function looks like on blood test today.    Influenza Virus Vaccine injection (Fluarix) What is this medicine? INFLUENZA VIRUS VACCINE (in floo EN zuh VAHY ruhs vak SEEN) helps to reduce the risk of getting influenza also known as the flu. This medicine may be used for other purposes; ask your health care provider or pharmacist if you have questions. COMMON BRAND NAME(S): Fluarix, Fluzone What should I tell my health care provider before I take this medicine? They need to know if you have any of these conditions:  bleeding disorder like hemophilia  fever or infection  Guillain-Barre syndrome or other neurological problems  immune system problems  infection with the human immunodeficiency virus (HIV) or AIDS  low blood platelet counts  multiple sclerosis  an unusual or allergic reaction to influenza virus vaccine, eggs, chicken proteins, latex, gentamicin, other medicines, foods, dyes or preservatives  pregnant or trying to get pregnant  breast-feeding How should I use this medicine? This vaccine is for injection into a muscle. It is given by a health care professional. A copy of Vaccine Information Statements will be given before each vaccination. Read this sheet carefully each time. The sheet may change frequently. Talk to your pediatrician regarding the use of this medicine in children. Special care may be needed. Overdosage: If you think you have taken too much of this medicine contact a poison control center or emergency room at once. NOTE: This medicine is only for you. Do not share this medicine with  others. What if I miss a dose? This does not apply. What may interact with this medicine?  chemotherapy or radiation therapy  medicines that lower your immune system like etanercept, anakinra, infliximab, and adalimumab  medicines that treat or prevent blood clots like warfarin  phenytoin  steroid medicines like prednisone or cortisone  theophylline  vaccines This list may not describe all possible interactions. Give your health care provider a list of all the medicines, herbs, non-prescription drugs, or dietary supplements you use. Also tell them if you smoke, drink alcohol, or use illegal drugs. Some items may interact with your medicine. What should I watch for while using this medicine? Report any side effects that do not go away within 3 days to your doctor or health care professional. Call your health care provider if any unusual symptoms occur within 6 weeks of receiving this vaccine. You may still catch the flu, but the illness is not usually as bad. You cannot get the flu from the vaccine. The vaccine will not protect against colds or other illnesses that may cause fever. The vaccine is needed every year. What side effects may I notice from receiving this medicine? Side effects that you should report to your doctor or health care professional as soon as possible:  allergic reactions like skin rash, itching or hives, swelling of the face, lips, or tongue Side effects that usually do not require medical attention (report to your doctor or health care professional if they continue or are bothersome):  fever  headache  muscle aches and pains  pain, tenderness, redness, or  swelling at site where injected  weak or tired This list may not describe all possible side effects. Call your doctor for medical advice about side effects. You may report side effects to FDA at 1-800-FDA-1088. Where should I keep my medicine? This vaccine is only given in a clinic, pharmacy, doctor's  office, or other health care setting and will not be stored at home. NOTE: This sheet is a summary. It may not cover all possible information. If you have questions about this medicine, talk to your doctor, pharmacist, or health care provider.  2020 Elsevier/Gold Standard (2007-07-30 09:30:40)

## 2018-12-26 LAB — COMPREHENSIVE METABOLIC PANEL
ALT: 36 IU/L (ref 0–44)
AST: 23 IU/L (ref 0–40)
Albumin/Globulin Ratio: 1.2 (ref 1.2–2.2)
Albumin: 4.2 g/dL (ref 3.8–4.9)
Alkaline Phosphatase: 154 IU/L — ABNORMAL HIGH (ref 39–117)
BUN/Creatinine Ratio: 9 (ref 9–20)
BUN: 18 mg/dL (ref 6–24)
Bilirubin Total: 0.3 mg/dL (ref 0.0–1.2)
CO2: 25 mmol/L (ref 20–29)
Calcium: 9.6 mg/dL (ref 8.7–10.2)
Chloride: 100 mmol/L (ref 96–106)
Creatinine, Ser: 1.94 mg/dL — ABNORMAL HIGH (ref 0.76–1.27)
GFR calc Af Amer: 43 mL/min/{1.73_m2} — ABNORMAL LOW (ref >59)
GFR calc non Af Amer: 37 mL/min/{1.73_m2} — ABNORMAL LOW (ref >59)
Globulin, Total: 3.5 g/dL (ref 1.5–4.5)
Glucose: 342 mg/dL — ABNORMAL HIGH (ref 65–99)
Potassium: 4 mmol/L (ref 3.5–5.2)
Sodium: 141 mmol/L (ref 134–144)
Total Protein: 7.7 g/dL (ref 6.0–8.5)

## 2018-12-26 LAB — MICROALBUMIN / CREATININE URINE RATIO
Creatinine, Urine: 43.3 mg/dL
Microalb/Creat Ratio: 1146 mg/g creat — ABNORMAL HIGH (ref 0–29)
Microalbumin, Urine: 496.3 ug/mL

## 2018-12-27 ENCOUNTER — Telehealth: Payer: Self-pay | Admitting: Internal Medicine

## 2018-12-27 DIAGNOSIS — E1165 Type 2 diabetes mellitus with hyperglycemia: Secondary | ICD-10-CM

## 2018-12-27 DIAGNOSIS — IMO0002 Reserved for concepts with insufficient information to code with codable children: Secondary | ICD-10-CM

## 2018-12-27 DIAGNOSIS — E1121 Type 2 diabetes mellitus with diabetic nephropathy: Secondary | ICD-10-CM

## 2018-12-27 DIAGNOSIS — E1122 Type 2 diabetes mellitus with diabetic chronic kidney disease: Secondary | ICD-10-CM

## 2018-12-27 DIAGNOSIS — I10 Essential (primary) hypertension: Secondary | ICD-10-CM

## 2018-12-27 MED ORDER — CARVEDILOL 3.125 MG PO TABS: 3.1250 mg | ORAL_TABLET | Freq: Two times a day (BID) | ORAL | 3 refills | Status: DC

## 2018-12-27 MED ORDER — GABAPENTIN 300 MG PO CAPS: ORAL_CAPSULE | ORAL | 6 refills | Status: DC

## 2018-12-27 MED ORDER — LISINOPRIL 10 MG PO TABS: 10.0000 mg | ORAL_TABLET | Freq: Every day | ORAL | 6 refills | Status: DC

## 2018-12-27 NOTE — Telephone Encounter (Signed)
PC placed to pt today to discuss lab results.  Pt informed that his kidney function has worsen compared to 5 mths ago.  He is not on NSAIDS.  Still has over 1 gram protein in the urine. Recommend: -referral to kidney specialist. Would have to be [facility] or [facility] since uninsured -currently on Gabapentin 600 mg BID.  Dec to 300 mg a.m and 600 mg p.m -D/C Farxiga -dec Lisinopril to 10 mg daily.  Add Coreg 3.125 mg BID instead.  He will pick uup on Monday. Pt was able to repeat back instructions to me.

## 2018-12-29 MED FILL — ?CARVEDILOL 3.125 MG TABLET: 3.125 | 30 days supply | Qty: 60 | Fill #0

## 2018-12-30 ENCOUNTER — Other Ambulatory Visit: Payer: Self-pay

## 2018-12-30 ENCOUNTER — Ambulatory Visit (HOSPITAL_COMMUNITY)
Admission: RE | Admit: 2018-12-30 | Discharge: 2018-12-30 | Disposition: A | Discharge status: Home / Self Care | Payer: Self-pay | Source: Ambulatory Visit | Attending: Internal Medicine | Admitting: Internal Medicine

## 2018-12-30 DIAGNOSIS — R1110 Vomiting, unspecified: Secondary | ICD-10-CM | POA: Insufficient documentation

## 2018-12-30 MED FILL — PANTOPRAZOLE SOD DR 40 MG T: 40 | 30 days supply | Qty: 60 | Fill #0

## 2019-01-29 ENCOUNTER — Telehealth: Payer: Self-pay | Admitting: Licensed Clinical Social Worker

## 2019-01-29 NOTE — Telephone Encounter (Signed)
MSW Intern placed call to patient to follow up regarding voicemail left for Christa See, LCSW on 01/26/19. Patient shared he has experienced multiple psychosocial stressors over the past year including a recent divorce, neuropathy, and risk of homelessness. Patient reports he is interested in brief therapy. This MSW Intern scheduled patient for a behavioral health consult on 02/04/19.

## 2019-02-04 ENCOUNTER — Institutional Professional Consult (permissible substitution): Payer: Self-pay | Admitting: Licensed Clinical Social Worker

## 2019-02-04 MED FILL — PANTOPRAZOLE SOD DR 40 MG T: 40 | 26 days supply | Qty: 52 | Fill #1

## 2019-02-04 MED FILL — GABAPENTIN 300 MG CAPSULE: 300 | 30 days supply | Qty: 180 | Fill #2

## 2019-02-06 ENCOUNTER — Ambulatory Visit (INDEPENDENT_AMBULATORY_CARE_PROVIDER_SITE_OTHER): Payer: Self-pay | Admitting: Gastroenterology

## 2019-02-06 ENCOUNTER — Encounter: Payer: Self-pay | Admitting: Gastroenterology

## 2019-02-06 DIAGNOSIS — K5909 Other constipation: Secondary | ICD-10-CM

## 2019-02-06 DIAGNOSIS — K256 Chronic or unspecified gastric ulcer with both hemorrhage and perforation: Secondary | ICD-10-CM

## 2019-02-06 DIAGNOSIS — K219 Gastro-esophageal reflux disease without esophagitis: Secondary | ICD-10-CM

## 2019-02-06 NOTE — Progress Notes (Signed)
TELEHEALTH VISIT  Referring Provider: Ladell Pier, MD Primary Care Physician:  Ladell Pier, MD  Tele-visit due to COVID-19 pandemic Patient requested visit virtually, consented to the virtual encounter via audio enabled telemedicine application (D[employer]im[employer]) Contact made at: 15:00 02/06/19 Patient verified by name and date of birth Location of patient: Home Location provider: [facility] Names of persons participating: Me, patient, Tinnie Gens CMA Time spent on telehealth visit including chart review, encounter, and documentation: 28 minutes  I discussed the limitations of evaluation and management by telemedicine. The patient expressed understanding and agreed to proceed.  Chief Complaint:  Abdominal pain   IMPRESSION:  H pylori negative gastric ulcers on EGD Reflux esophagitis PPI QAM and Pepcid QPM Peptic duodenitis Chronic constipation     - failed Miralax    - Amitiza 8 mcg BID resulted in diarrhea No known family history of colon cancer or polyps  Clinically improving off NSAIDs and with PPI BID. Repeat EGD with recurrent symptoms to document healing of gastric ulcers.  Will use Amitiza for constipation PRN. Will obtain prior colonoscopy report to determine interval for next colonoscopy.   PLAN: Avoid all NSAIDs including ibuprofen Continue PPI therapy EGD if symptoms do not resolve or recur Continue Amitiza 66mg twice weekly as needed [place] [facility] [place]  Please see the "Patient Instructions" section for addition details about the plan.  HPI: Ryan HANDSis a 59y.o. male who is seen in scheduled follow-up. He was initially seen by NP GChester Holsteinin consultation for abdominal pain and bowel changes.   He had an EGD  12/09/18 that showed:  - Normal esophagus with mild reflux on biopsies. No eosinophilic esophagitis. - Many non-bleeding linear gastric ulcers with no stigmata of bleeding  were found in the gastric body and in the gastric antrum. Biopsies negative for H pylori - Peptic duodenitis.   He has done well since the procedure on pantoprazole BID. He reports rare vomiting. Pain has resolved. No longer using ibuprofen.   Trial of Amitiza for constipation resulted in diarrhea. Dr. JWynetta Emerysuggested that he use it twice weekly. He finds that he doesn't even use it that often.  No new complaints or concerns today.  He had a screening colonoscopy with [facility] 4 years ago.  No known family history of colon cancer or polyps. No family history of uterine/endometrial cancer, pancreatic cancer or gastric/stomach cancer.   Past Medical History:  Diagnosis Date  . Abdominal pain 11/25/2018  . Acid reflux 11/25/2018  . Anxiety   . Bilateral swelling of feet and ankles 11/25/2018   and legs  . Change in bowel habits 11/25/2018  . Constipation 11/25/2018  . Depression   . Diabetes mellitus without complication ([facility] [place]   . Diabetic neuropathy ([facility]   . Diarrhea 11/25/2018  . Fatigue 11/25/2018  . GERD (gastroesophageal reflux disease)   . Hypertension   . Loss of appetite 11/25/2018  . Muscle spasm 11/25/2018  . Neuromuscular disorder (HCC)    neuropathy  . Retinopathy due to secondary diabetes ([facility] [place]   . Sleep disturbances 11/25/2018  . Vision changes 11/25/2018  . Vitamin B12 deficiency     Past Surgical History:  Procedure Laterality Date  . CATARACT EXTRACTION Bilateral 2017    Current Outpatient Medications  Medication Sig Dispense Refill  . amLODipine (NORVASC) 10 MG tablet Take 1 tablet (10 mg total) by mouth daily. 30 tablet 6  . atorvastatin (LIPITOR) 40  MG tablet Take 1 tablet (40 mg total) by mouth daily. 30 tablet 6  . Blood Glucose Monitoring Suppl (TRUE METRIX METER) w/Device KIT Use as directed 1 kit 0  . carvedilol (COREG) 3.125 MG tablet Take 1 tablet (3.125 mg total) by mouth 2 (two) times daily with a meal. 60 tablet 3  . cyclobenzaprine  (FLEXERIL) 5 MG tablet TAKE 1 TABLET (5 MG TOTAL) BY MOUTH 2 (TWO) TIMES DAILY AS NEEDED FOR MUSCLE SPASMS (MEDICATION CAN CAUSE DROWSINESS). 15 tablet 0  . escitalopram (LEXAPRO) 20 MG tablet Take 1 tablet (20 mg total) by mouth daily. 30 tablet 6  . gabapentin (NEURONTIN) 300 MG capsule TAKE 2 CAPSULES BY MOUTH in evening and 1 cap Q a.m 90 capsule 6  . glucose blood test strip Use as instructed 100 each 12  . Insulin Glargine (LANTUS SOLOSTAR) 100 UNIT/ML Solostar Pen Inject 46 Units into the skin daily at 10 pm. 5 pen 3  . insulin lispro (HUMALOG KWIKPEN) 100 UNIT/ML KwikPen 14 units subcu before breakfast and lunch, 17 units with dinner. 15 mL 11  . Insulin Pen Needle (PEN NEEDLES) 31G X 8 MM MISC Use as directed 100 each 6  . lisinopril (ZESTRIL) 10 MG tablet Take 1 tablet (10 mg total) by mouth daily. 30 tablet 6  . ondansetron (ZOFRAN) 4 MG tablet Take 1 tablet (4 mg total) by mouth every 8 (eight) hours as needed for nausea or vomiting. 20 tablet 0  . pantoprazole (PROTONIX) 40 MG tablet Take 1 tablet (40 mg total) by mouth 2 (two) times daily. 112 tablet 0  . TRUEPLUS LANCETS 28G MISC Use as directed 100 each 1  . vitamin B-12 (CYANOCOBALAMIN) 1000 MCG tablet Take 1 tablet (1,000 mcg total) by mouth daily. 100 tablet 3   No current facility-administered medications for this visit.    Allergies as of 02/06/2019 - Review Complete 12/25/2018  Allergen Reaction Noted  . Claritin [loratadine] Swelling 12/15/2013  . Hydrochlorothiazide  02/25/2018  . Lyrica [pregabalin]  03/21/2017  . Metformin and related  07/03/2018    Family History  Problem Relation Age of Onset  . Renal cancer Mother   . Hypertension Mother   . Pancreatic cancer Mother   . Hypertension Sister   . Stroke Sister   . Leukemia Maternal Uncle   . Sickle cell trait Maternal Aunt   . Colon cancer Neg Hx   . Esophageal cancer Neg Hx   . Rectal cancer Neg Hx   . Stomach cancer Neg Hx     Social History    Socioeconomic History  . Marital status: Divorced    Spouse name: Not on file  . Number of children: 1  . Years of education: 31  . Highest education level: Not on file  Occupational History  . 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 Smoking status: Never Smoker  . Smokeless tobacco: Never Used  Substance and Sexual Activity  . Alcohol use: No    Alcohol/week: 0.0 standard drinks  . Drug use: No  . Sexual activity: Yes  Other Topics Concern  . Not on file  Social History Narrative  . Not on file   Social Determinants of Health   Financial Resource Strain:   . Difficulty of Paying Living Expenses: Not on file  Food Insecurity:   . Worried About Charity fundraiser in the Last Year: Not on file  . Ran Out of Food in  the Last Year: Not on file  Transportation Needs:   . Lack of Transportation (Medical): Not on file  . Lack of Transportation (Non-Medical): Not on file  Physical Activity:   . Days of Exercise per Week: Not on file  . Minutes of Exercise per Session: Not on file  Stress:   . Feeling of Stress : Not on file  Social Connections:   . Frequency of Communication with Friends and Family: Not on file  . Frequency of Social Gatherings with Friends and Family: Not on file  . Attends Religious Services: Not on file  . Active Member of Clubs or Organizations: Not on file  . Attends Archivist Meetings: Not on file  . Marital Status: Not on file  Intimate Partner Violence:   . Fear of Current or Ex-Partner: Not on file  . Emotionally Abused: Not on file  . Physically Abused: Not on file  . Sexually Abused: Not on file    Physical Exam: Complete physical exam not performed due to the limits inherent in a telehealth encounter.  General: Awake, alert, and oriented, and well communicative. In no acute distress.  Pulm: No labored breathing, speaking in full sentences without conversational dyspnea  Psych: Pleasant,  cooperative, normal speech, normal affect and normal insight Neuro: Alert and appropriate    Yeily Link L. Tarri Glenn, MD, MPH 02/06/2019, 2:59 PM

## 2019-02-09 ENCOUNTER — Ambulatory Visit: Payer: Self-pay | Attending: Internal Medicine | Admitting: Licensed Clinical Social Worker

## 2019-02-09 ENCOUNTER — Other Ambulatory Visit: Payer: Self-pay

## 2019-02-09 DIAGNOSIS — F331 Major depressive disorder, recurrent, moderate: Secondary | ICD-10-CM

## 2019-02-13 NOTE — BH Specialist Note (Signed)
Integrated Behavioral Health Initial Visit  MRN: [id number] Name: Ryan Jenkins  Number of [place] Clinician visits:: 1/6 Session Start time: 11:15 AM  Session End time: 11:45 AM Total time: 30  Type of Service: [place] Interpretor:No. Interpretor Name and Language: NA   Warm Hand Off Completed.       SUBJECTIVE: Ryan Jenkins is a 59 y.o. male accompanied by self Patient was referred by Dr. Wynetta Emery for anxiety and depression. Patient reports the following symptoms/concerns: Pt reports increase in anxiety and depression triggered by psychosocial stressors. Pt shared that he recently fell due to limited mobility and neuropathy. He does utilize cane and walker in the home. Pt has lost independent housing; however, is currently residing with friend and recently finalized divorce Duration of problem: Ongoing; Severity of problem: moderate  OBJECTIVE: Mood: Anxious and Depressed and Affect: Appropriate Risk of harm to self or others: No plan to harm self or others  LIFE CONTEXT: Family and Social: Pt receives support from family and friends School/Work: Pt works occasionally as a Teacher, music: Pt enjoys working in [place]: Pt reports difficulty in [place] and physical health triggered by stressors  GOALS ADDRESSED: Patient will: 1. Reduce symptoms of: anxiety and depression 2. Increase knowledge and/or ability of: coping skills and healthy habits  3. Demonstrate ability to: Increase healthy adjustment to current life circumstances and Increase adequate support systems for patient/family  INTERVENTIONS: Interventions utilized: Solution-Focused Strategies and Supportive Counseling  Standardized Assessments completed: GAD-7 and PHQ 2&9  ASSESSMENT: Patient currently experiencing depression and anxiety triggered by psychosocial stressors. He is reporting difficulty managing medical conditions,  divorce was recently finalized, and pt lost independent housing due to financial strain. Denies SI/HI   Patient may benefit from psychotherapy and medication management. Therapeutic strategies discussed to assist in the management or decrease of symptoms.  PLAN: 1. Follow up with behavioral health clinician on : Schedule follow up appointment with LCSW 2. Behavioral recommendations: Utilize healthy coping skills and comply with medication management 3. Referral(s): [place] (In Clinic) 4. "From scale of 1-10, how likely are you to follow plan?":   Rebekah Chesterfield, LCSW  02/13/2019 4:48 PM

## 2019-03-26 MED FILL — ESCITALOPRAM 20 MG TABLET: 20 | 30 days supply | Qty: 30 | Fill #2

## 2019-04-02 ENCOUNTER — Other Ambulatory Visit: Payer: Self-pay

## 2019-04-02 ENCOUNTER — Encounter: Payer: Self-pay | Admitting: Internal Medicine

## 2019-04-02 ENCOUNTER — Ambulatory Visit: Payer: Self-pay | Attending: Internal Medicine | Admitting: Internal Medicine

## 2019-04-02 ENCOUNTER — Other Ambulatory Visit: Payer: Self-pay | Admitting: Internal Medicine

## 2019-04-02 VITALS — BP 171/97 | HR 95 | Temp 97.0°F | Resp 16 | Wt 214.8 lb

## 2019-04-02 DIAGNOSIS — K2970 Gastritis, unspecified, without bleeding: Secondary | ICD-10-CM

## 2019-04-02 DIAGNOSIS — IMO0002 Reserved for concepts with insufficient information to code with codable children: Secondary | ICD-10-CM

## 2019-04-02 DIAGNOSIS — I10 Essential (primary) hypertension: Secondary | ICD-10-CM

## 2019-04-02 DIAGNOSIS — E1121 Type 2 diabetes mellitus with diabetic nephropathy: Secondary | ICD-10-CM

## 2019-04-02 DIAGNOSIS — R1110 Vomiting, unspecified: Secondary | ICD-10-CM

## 2019-04-02 DIAGNOSIS — K2990 Gastroduodenitis, unspecified, without bleeding: Secondary | ICD-10-CM

## 2019-04-02 DIAGNOSIS — Z9114 Patient's other noncompliance with medication regimen: Secondary | ICD-10-CM | POA: Insufficient documentation

## 2019-04-02 DIAGNOSIS — E1142 Type 2 diabetes mellitus with diabetic polyneuropathy: Secondary | ICD-10-CM

## 2019-04-02 DIAGNOSIS — E1165 Type 2 diabetes mellitus with hyperglycemia: Secondary | ICD-10-CM

## 2019-04-02 LAB — POCT GLYCOSYLATED HEMOGLOBIN (HGB A1C)

## 2019-04-02 LAB — GLUCOSE, POCT (MANUAL RESULT ENTRY): POC Glucose: 264 mg/dl — AB (ref 70–99)

## 2019-04-02 MED ORDER — DULOXETINE HCL 20 MG PO CPEP: 20.0000 mg | ORAL_CAPSULE | Freq: Every day | ORAL | 3 refills | Status: DC

## 2019-04-02 MED ORDER — LISINOPRIL 20 MG PO TABS: 20.0000 mg | ORAL_TABLET | Freq: Every day | ORAL | 6 refills | Status: DC

## 2019-04-02 MED ORDER — PANTOPRAZOLE SODIUM 40 MG PO TBEC: 40.0000 mg | DELAYED_RELEASE_TABLET | Freq: Every day | ORAL | 1 refills | Status: DC

## 2019-04-02 MED FILL — LISINOPRIL 20 MG TABLET: 20 | 30 days supply | Qty: 30 | Fill #0

## 2019-04-02 MED FILL — PANTOPRAZOLE SOD DR 20 MG T: 20 | 30 days supply | Qty: 30 | Fill #2

## 2019-04-02 MED FILL — DULoxetine HCL 20 MG CPEP: 20 | 30 days supply | Qty: 30 | Fill #0

## 2019-04-02 MED FILL — ?CARVEDILOL 3.125 MG TABLET: 3.125 | 30 days supply | Qty: 60 | Fill #1

## 2019-04-02 MED FILL — ONDANSETRON HCL 4 MG TABLET: 4 | 6 days supply | Qty: 20 | Fill #0

## 2019-04-02 MED FILL — ATORVASTATIN CALCIUM 40 MG: 40 | 30 days supply | Qty: 30 | Fill #2

## 2019-04-02 MED FILL — GABAPENTIN 300 MG CAPSULE: 300 | 30 days supply | Qty: 180 | Fill #3

## 2019-04-02 MED FILL — ?PANTOPRAZOLE SO DR 40MG TA: 40 | 30 days supply | Qty: 30 | Fill #0

## 2019-04-02 MED FILL — TRUE METRIX TEST STRIP: 30 days supply | Qty: 100 | Fill #1

## 2019-04-02 MED FILL — AMLODIPINE BESYLATE 10 MG T: 10 | 30 days supply | Qty: 30 | Fill #2

## 2019-04-02 NOTE — Telephone Encounter (Signed)
Please fill if patient may continue usage

## 2019-04-02 NOTE — Patient Instructions (Signed)
Please return tomorrow to meet with the clinical pharmacist.  Please bring all of your medications with you to that appointment including your insulin and needles.  We have added a medication called Cymbalta 20 mg daily to help with the peripheral neuropathy symptoms.

## 2019-04-02 NOTE — Progress Notes (Signed)
Patient ID: Ryan Jenkins, male    DOB: January 18, 1960  MRN: [id number]  CC: Diabetes and Hypertension   Subjective: Ryan Jenkins is a 58 y.o. male who presents for chronic ds management His concerns today include:  Pt with hx of DM withretinopathy,neuropathy (in feet and hands), macroalbuminuria with CKD 3,HTN, HL,anxiety,Vit B12 def chronic LBP, gastric ulcers.  DIABETES TYPE 2 Last A1C:   Results for orders placed or performed in visit on 04/02/19  POCT glucose (manual entry)  Result Value Ref Range   POC Glucose 264 (A) 70 - 99 mg/dl  POCT glycosylated hemoglobin (Hb A1C)  Result Value Ref Range   Hemoglobin A1C     HbA1c POC (<> result, manual entry)     HbA1c, POC (prediabetic range)     HbA1c, POC (controlled diabetic range)     A1C >15 Med Adherence: On last visit patient was not taking his medicines consistently and not set a goal to get back on them.  Patient tells me he is taking Lantus 46 units at at bedtime and Humalog 10/25/15.  However over the past week or more he states that he has had trouble getting the insulin to release from the pens.  He thinks the needles may be faulty.  I checked with the pharmacy and found out that his last refill on Humalog for 30-day supply was 12/10/2018 and Lantus was 07/31/2018.  However patient tells me that he has quite a bit of stock of both insulins at home. Medication side effects:  []  Yes    []  No Home Monitoring?  [x]  Yes but not regularly.   Home glucose results range: 200-400 Diet Adherence: Still gets intermittent flares of vomiting Exercise: []  Yes    [x]  No Hypoglycemic episodes?: []  Yes    []  No Numbness of the feet? [x]  Yes.  Complains of increased pain in his arms from the elbows down and legs from the knees down.  We had decreased the dose of gabapentin because of his kidney function.  He is now taking 300 mg TID Retinopathy hx? [x]  Yes  -he tells me that he has not followed up with [facility] ophthalmology as  yet. Last eye exam: Overdue  CKD 3: He was seen at [facility] last month.  His blood pressure was 173/100 on that visit.  His blood sugar on chemistry was over 600 with pseudohyponatremia, A1c 15%, SPEP and FLC unremarkable.  Urine protein 3.2 g.  Patient advised to increase his lisinopril back to 20 mg daily.  His next visit with them is 06/12/2019. -Patient reports compliance with taking his blood pressure medications including lisinopril which he is now taking 20 mg, Norvasc and carvedilol.  Again however on my check with the pharmacy, he last filled carvedilol 12/29/2018 and lisinopril and amlodipine 11/2018 all with 30-day supplies.  Patient insists that he has a lot of lisinopril and amlodipine at home but has to check to see whether he has the carvedilol.  HL: He thinks he is taking the Lipitor.  He saw the gastroenterologist Dr. Tarri Glenn in follow-up since last visit with me.  He was continued on PPI therapy.  Told to take Amitiza twice weekly as needed.  HM:  Had c-scope about 7-6 yrs ago through [facility]  Patient Active Problem List   Diagnosis Date Noted  . Gastric ulcer without hemorrhage or perforation 12/25/2018  . Stage 3a chronic kidney disease 12/25/2018  . Gastroesophageal reflux disease without esophagitis 09/04/2018  .  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 Intermittent diarrhea 09/27/2017  . Gastroparesis 09/27/2017  . Macroalbuminuric diabetic nephropathy ([place]) 06/25/2017  . History of falling 06/25/2017  . Hyperlipidemia 03/21/2017  . Vitamin B 12 deficiency 03/21/2017  . Uncontrolled type 2 diabetes mellitus with peripheral neuropathy ([place]) 02/05/2017  . Essential hypertension 02/05/2017  . Depression 02/05/2017  . Unintended weight loss 02/05/2017  . Gait disturbance 02/05/2017  . 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 Diabetic neuropathy, type II diabetes mellitus ([place]) 05/11/2014  .  Metatarsal deformity 05/11/2014     Current Outpatient Medications on File Prior to Visit  Medication Sig Dispense Refill  . amLODipine (NORVASC) 10 MG tablet Take 1 tablet (10 mg total) by mouth daily. 30 tablet 6  . atorvastatin (LIPITOR) 40 MG tablet Take 1 tablet (40 mg total) by mouth daily. 30 tablet 6  . Blood Glucose Monitoring Suppl (TRUE METRIX METER) w/Device KIT Use as directed 1 kit 0  . carvedilol (COREG) 3.125 MG tablet Take 1 tablet (3.125 mg total) by mouth 2 (two) times daily with a meal. 60 tablet 3  . cyclobenzaprine (FLEXERIL) 5 MG tablet TAKE 1 TABLET (5 MG TOTAL) BY MOUTH 2 (TWO) TIMES DAILY AS NEEDED FOR MUSCLE SPASMS (MEDICATION CAN CAUSE DROWSINESS). 15 tablet 0  . gabapentin (NEURONTIN) 300 MG capsule TAKE 2 CAPSULES BY MOUTH in evening and 1 cap Q a.m 90 capsule 6  . glucose blood test strip Use as instructed 100 each 12  . Insulin Glargine (LANTUS SOLOSTAR) 100 UNIT/ML Solostar Pen Inject 46 Units into the skin daily at 10 pm. 5 pen 3  . insulin lispro (HUMALOG KWIKPEN) 100 UNIT/ML KwikPen 14 units subcu before breakfast and lunch, 17 units with dinner. 15 mL 11  . Insulin Pen Needle (PEN NEEDLES) 31G X 8 MM MISC Use as directed 100 each 6  . ondansetron (ZOFRAN) 4 MG tablet Take 1 tablet (4 mg total) by mouth every 8 (eight) hours as needed for nausea or vomiting. 20 tablet 0  . TRUEPLUS LANCETS 28G MISC Use as directed 100 each 1  . vitamin B-12 (CYANOCOBALAMIN) 1000 MCG tablet Take 1 tablet (1,000 mcg total) by mouth daily. 100 tablet 3   No current facility-administered medications on file prior to visit.    Allergies  Allergen Reactions  . Claritin [Loratadine] Swelling    Joint swelling   . Hydrochlorothiazide     Dizziness  . Lyrica [Pregabalin]     depression  . Metformin And Related     GI    Social History   Socioeconomic History  . Marital status: Divorced    Spouse name: Not on file  . Number of children: 1  . Years of education: 99    . Highest education level: Not on file  Occupational History  . 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 Smoking status: Never Smoker  . Smokeless tobacco: Never Used  Substance and Sexual Activity  . Alcohol use: No    Alcohol/week: 0.0 standard drinks  . Drug use: No  . Sexual activity: Yes  Other Topics Concern  . Not on file  Social History Narrative  . Not on file   Social Determinants of Health   Financial Resource Strain:   . Difficulty of Paying Living Expenses:   Food Insecurity:   . Worried About Charity fundraiser in the Last Year:   .  Ran Out of Food in the Last Year:   Transportation Needs:   . Film/video editor (Medical):   [name] Lack of Transportation (Non-Medical):   Physical Activity:   . Days of Exercise per Week:   . Minutes of Exercise per Session:   Stress:   . Feeling of Stress :   Social Connections:   . Frequency of Communication with Friends and Family:   . Frequency of Social Gatherings with Friends and Family:   . Attends Religious Services:   . Active Member of Clubs or Organizations:   . Attends Archivist Meetings:   [name] Marital Status:   Intimate Partner Violence:   . Fear of Current or Ex-Partner:   . Emotionally Abused:   [name] Physically Abused:   . Sexually Abused:     Family History  Problem Relation Age of Onset  . Renal cancer Mother   . Hypertension Mother   . Pancreatic cancer Mother   . Hypertension Sister   . Stroke Sister   . Leukemia Maternal Uncle   . Sickle cell trait Maternal Aunt   . Colon cancer Neg Hx   . Esophageal cancer Neg Hx   . Rectal cancer Neg Hx   . Stomach cancer Neg Hx     Past Surgical History:  Procedure Laterality Date  . CATARACT EXTRACTION Bilateral 2017    ROS: Review of Systems Negative except as stated above  PHYSICAL EXAM: BP (!) 171/97   Pulse 95   Temp (!) 97 F (36.1 C)   Resp 16   Wt 214 lb 12.8 oz (97.4 kg)   SpO2 99%   BMI  27.58 kg/m   Wt Readings from Last 3 Encounters:  04/02/19 214 lb 12.8 oz (97.4 kg)  12/25/18 226 lb 12.8 oz (102.9 kg)  12/09/18 234 lb (106.1 kg)    Physical Exam  General appearance - alert, well appearing, older African-American male and in no distress Mental status - normal mood, behavior, speech, dress, motor activity, and thought processes Mouth - mucous membranes moist, pharynx normal without lesions Neck - supple, no significant adenopathy Chest - clear to auscultation, no wheezes, rales or rhonchi, symmetric air entry Heart - normal rate, regular rhythm, normal S1, S2, no murmurs, rubs, clicks or gallops Extremities -no lower extremity edema.  He has a cane with him today.  He transfers independently from chair to exam table. CMP Latest Ref Rng & Units 12/25/2018 07/31/2018 07/03/2018  Glucose 65 - 99 mg/dL 342(H) - 216(H)  BUN 6 - 24 mg/dL 18 - 13  Creatinine 0.76 - 1.27 mg/dL 1.94(H) - 1.63(H)  Sodium 134 - 144 mmol/L 141 - 140  Potassium 3.5 - 5.2 mmol/L 4.0 - 4.1  Chloride 96 - 106 mmol/L 100 - 101  CO2 20 - 29 mmol/L 25 - 24  Calcium 8.7 - 10.2 mg/dL 9.6 - 9.1  Total Protein 6.0 - 8.5 g/dL 7.7 7.7 -  Total Bilirubin 0.0 - 1.2 mg/dL 0.3 0.3 -  Alkaline Phos 39 - 117 IU/L 154(H) 118(H) -  AST 0 - 40 IU/L 23 19 -  ALT 0 - 44 IU/L 36 25 -   Lipid Panel     Component Value Date/Time   CHOL 172 02/25/2018 1657   TRIG 158 (H) 02/25/2018 1657   HDL 39 (L) 02/25/2018 1657   CHOLHDL 4.4 02/25/2018 1657   LDLCALC 101 (H) 02/25/2018 1657    CBC    Component Value Date/Time   WBC  7.7 08/07/2017 1424   WBC 6.5 06/18/2017 1517   RBC 4.43 08/07/2017 1424   RBC 4.31 06/18/2017 1517   HGB 13.3 08/07/2017 1424   HCT 40.6 08/07/2017 1424   PLT 335 08/07/2017 1424   MCV 92 08/07/2017 1424   MCH 30.0 08/07/2017 1424   MCH 30.4 06/18/2017 1517   MCHC 32.8 08/07/2017 1424   MCHC 34.7 06/18/2017 1517   RDW 14.5 08/07/2017 1424   LYMPHSABS 2.6 08/07/2017 1424   EOSABS  0.2 08/07/2017 1424   BASOSABS 0.0 08/07/2017 1424   Blood test done at [facility] 03/13/2019 reviewed.  Creatinine was 1.8 and estimated GFR was 47  ASSESSMENT AND PLAN: 1. History of medication noncompliance -This has been a major issue. I inquired whether he is having problems affording his medications.  He tells me that now he does because he is only working 2 days a week.  I will have our social worker touch base with him or when he sees the clinical pharmacist tomorrow he can double check as to whether he has the blue card with our pharmacy. -Discuss how uncontrolled diabetes and blood pressure are contributing to decline in kidney function, poor vision and his peripheral neuropathy.  Strongly encourage compliance with medications  2. Uncontrolled type 2 diabetes mellitus with peripheral neuropathy [facility]) Patient will return to see the clinical pharmacist tomorrow for medication reconciliation and to check his insulin pens and needles to see if there is any malfunctioning.  -He will continue the gabapentin at current dose.  I did not increase it due to his kidney function.  We will add low-dose of Cymbalta instead to help decrease the pain from neuropathy.  However I stressed the importance of blood sugar control.  Lexapro taken off med list as he has not been taking that for several months now. - POCT glucose (manual entry) - POCT glycosylated hemoglobin (Hb A1C) - DULoxetine (CYMBALTA) 20 MG capsule; Take 1 capsule (20 mg total) by mouth daily.  Dispense: 30 capsule; Refill: 3  3. Essential hypertension Not at goal.  He reports he has not taken his medicines as yet for today.  I suspect noncompliance based on last refill dates of carvedilol, lisinopril and amlodipine.  Discussed health risks associated with uncontrolled blood pressure - lisinopril (ZESTRIL) 20 MG tablet; Take 1 tablet (20 mg total) by mouth daily.  Dispense: 30 tablet; Refill: 6  4. Diabetic nephropathy  associated with type 2 diabetes mellitus ([place]) -Seen at [facility] residency nephrology clinic.  I have stressed the importance of getting his diabetes under control.  He will continue lisinopril.  5. Gastritis and gastroduodenitis Decrease proton pump inhibitor to once a day due to poor kidney function     Patient was given the opportunity to ask questions.  Patient verbalized understanding of the plan and was able to repeat key elements of the plan.   Orders Placed This Encounter  Procedures  . POCT glucose (manual entry)  . POCT glycosylated hemoglobin (Hb A1C)     Requested Prescriptions   Signed Prescriptions Disp Refills  . lisinopril (ZESTRIL) 20 MG tablet 30 tablet 6    Sig: Take 1 tablet (20 mg total) by mouth daily.  . DULoxetine (CYMBALTA) 20 MG capsule 30 capsule 3    Sig: Take 1 capsule (20 mg total) by mouth daily.  . pantoprazole (PROTONIX) 40 MG tablet 30 tablet 1    Sig: Take 1 tablet (40 mg total) by mouth daily.  Return in about 6 weeks (around 05/14/2019).  Karle Plumber, MD, FACP

## 2019-04-02 NOTE — Progress Notes (Signed)
Pt states is he is also having pain in b/l legs

## 2019-04-03 ENCOUNTER — Ambulatory Visit: Payer: Self-pay | Admitting: Pharmacist

## 2019-04-06 ENCOUNTER — Ambulatory Visit: Payer: Self-pay | Admitting: Pharmacist

## 2019-04-07 ENCOUNTER — Telehealth: Payer: Self-pay

## 2019-04-07 NOTE — Telephone Encounter (Signed)
At request of Dr Wynetta Emery, call placed to patient to discuss barriers to obtaining medications. He said that he was told in the past that he would not qualify for medicaid. He applied for disability about a year ago and his case is now coming up for review. He said that he will be seeing the disability doctors for assessments. He also noted that he is working with an Forensic psychologist.  He said that his Blue Card has expired and he needs to renew. Informed him that he can pick up an application at [facility]. Also explained to him that this CM will check with [facility] pharmacy to inquire if he is eligible for patient assistance programs or [facility] assistance.

## 2019-04-08 ENCOUNTER — Telehealth: Payer: Self-pay

## 2019-04-08 NOTE — Telephone Encounter (Signed)
Message from [facility] stating that the patient is approved for the [facility]) assistance with medications through 12/25/2019 and they have filled what they can with that program.   Call placed to the patient and informed him of above.  He said that he picked up medications on Friday, 04/03/2019 and paid more than $50.  Message sent to [facility] requesting clarification of what medications of his are /are not  covered by [facility] and if there are any possible substitutions, with provider approval, that will reduce the cost.

## 2019-04-09 ENCOUNTER — Telehealth: Payer: Self-pay

## 2019-04-09 NOTE — Telephone Encounter (Signed)
Call placed to the patient and shared the information obtained from [facility] regarding medication assistance.   He could qualify for some medications through [facility], but it is not a guaranteed $0 every month. Th cost /month can vary depending on what medications are in stock so his cost /month could vary.  Some medications could cost $4 or $10   The best thing for him to do at this time is to apply for the [place] . He said that he understood.  This CM also reminded him that he received 10 medications for $50. ? Also informed him that  he picked up 2 strengths of pantoprazole-the one he payed for was an old refill and it looks like the MD changed the strength, so the pharmacy can give him $4 back for that one. Again he verbalized understanding.

## 2019-04-29 ENCOUNTER — Encounter: Payer: Self-pay | Admitting: Internal Medicine

## 2019-04-29 NOTE — Progress Notes (Signed)
I received colonoscopy report from [facility].  It was done by Dr. Michail Sermon on 05/22/2013.  Findings were three 5-10 mm polyps removed from the sigmoid colon.  There was diverticulosis in the ascending colon.  Internal hemorrhoids noted.  1 4 mm polyp in the sigmoid colon was resected.  Pathology results revealed all hyperplastic polyps.  No adenomatous change or malignancy.

## 2019-05-08 ENCOUNTER — Ambulatory Visit: Payer: Self-pay | Admitting: Internal Medicine

## 2019-07-21 ENCOUNTER — Ambulatory Visit: Payer: Self-pay | Admitting: Pharmacist

## 2019-07-22 ENCOUNTER — Ambulatory Visit (HOSPITAL_BASED_OUTPATIENT_CLINIC_OR_DEPARTMENT_OTHER): Payer: Medicaid Other | Admitting: Physician Assistant

## 2019-07-22 ENCOUNTER — Observation Stay (HOSPITAL_COMMUNITY)
Admission: EM | Admit: 2019-07-22 | Discharge: 2019-07-24 | Disposition: A | Discharge status: Home / Self Care | Payer: Medicaid Other | Attending: Internal Medicine | Admitting: Internal Medicine

## 2019-07-22 ENCOUNTER — Encounter (HOSPITAL_COMMUNITY): Payer: Self-pay | Admitting: Obstetrics and Gynecology

## 2019-07-22 ENCOUNTER — Emergency Department (HOSPITAL_COMMUNITY): Payer: Medicaid Other

## 2019-07-22 ENCOUNTER — Other Ambulatory Visit: Payer: Self-pay

## 2019-07-22 VITALS — BP 210/128 | HR 109 | Temp 98.2°F | Resp 16 | Wt 226.0 lb

## 2019-07-22 DIAGNOSIS — E11319 Type 2 diabetes mellitus with unspecified diabetic retinopathy without macular edema: Secondary | ICD-10-CM | POA: Diagnosis present

## 2019-07-22 DIAGNOSIS — IMO0002 Reserved for concepts with insufficient information to code with codable children: Secondary | ICD-10-CM

## 2019-07-22 DIAGNOSIS — R0602 Shortness of breath: Secondary | ICD-10-CM | POA: Diagnosis not present

## 2019-07-22 DIAGNOSIS — E1142 Type 2 diabetes mellitus with diabetic polyneuropathy: Secondary | ICD-10-CM | POA: Diagnosis not present

## 2019-07-22 DIAGNOSIS — Z794 Long term (current) use of insulin: Secondary | ICD-10-CM | POA: Diagnosis not present

## 2019-07-22 DIAGNOSIS — H5462 Unqualified visual loss, left eye, normal vision right eye: Secondary | ICD-10-CM | POA: Diagnosis not present

## 2019-07-22 DIAGNOSIS — E1122 Type 2 diabetes mellitus with diabetic chronic kidney disease: Secondary | ICD-10-CM | POA: Insufficient documentation

## 2019-07-22 DIAGNOSIS — R42 Dizziness and giddiness: Secondary | ICD-10-CM | POA: Diagnosis not present

## 2019-07-22 DIAGNOSIS — H538 Other visual disturbances: Secondary | ICD-10-CM | POA: Diagnosis not present

## 2019-07-22 DIAGNOSIS — N1832 Chronic kidney disease, stage 3b: Secondary | ICD-10-CM | POA: Diagnosis not present

## 2019-07-22 DIAGNOSIS — I160 Hypertensive urgency: Secondary | ICD-10-CM

## 2019-07-22 DIAGNOSIS — Z79899 Other long term (current) drug therapy: Secondary | ICD-10-CM | POA: Insufficient documentation

## 2019-07-22 DIAGNOSIS — R748 Abnormal levels of other serum enzymes: Secondary | ICD-10-CM | POA: Insufficient documentation

## 2019-07-22 DIAGNOSIS — R0600 Dyspnea, unspecified: Secondary | ICD-10-CM

## 2019-07-22 DIAGNOSIS — R739 Hyperglycemia, unspecified: Secondary | ICD-10-CM

## 2019-07-22 DIAGNOSIS — Z20822 Contact with and (suspected) exposure to covid-19: Secondary | ICD-10-CM | POA: Diagnosis not present

## 2019-07-22 DIAGNOSIS — N183 Chronic kidney disease, stage 3 unspecified: Secondary | ICD-10-CM | POA: Diagnosis not present

## 2019-07-22 DIAGNOSIS — Z9114 Patient's other noncompliance with medication regimen: Secondary | ICD-10-CM

## 2019-07-22 DIAGNOSIS — I129 Hypertensive chronic kidney disease with stage 1 through stage 4 chronic kidney disease, or unspecified chronic kidney disease: Secondary | ICD-10-CM | POA: Diagnosis present

## 2019-07-22 DIAGNOSIS — R778 Other specified abnormalities of plasma proteins: Secondary | ICD-10-CM

## 2019-07-22 DIAGNOSIS — E1140 Type 2 diabetes mellitus with diabetic neuropathy, unspecified: Secondary | ICD-10-CM | POA: Diagnosis not present

## 2019-07-22 DIAGNOSIS — I10 Essential (primary) hypertension: Secondary | ICD-10-CM

## 2019-07-22 DIAGNOSIS — H4312 Vitreous hemorrhage, left eye: Secondary | ICD-10-CM | POA: Diagnosis not present

## 2019-07-22 DIAGNOSIS — E1121 Type 2 diabetes mellitus with diabetic nephropathy: Secondary | ICD-10-CM

## 2019-07-22 DIAGNOSIS — Z91148 Patient's other noncompliance with medication regimen for other reason: Secondary | ICD-10-CM

## 2019-07-22 DIAGNOSIS — E1165 Type 2 diabetes mellitus with hyperglycemia: Secondary | ICD-10-CM | POA: Insufficient documentation

## 2019-07-22 DIAGNOSIS — R1110 Vomiting, unspecified: Secondary | ICD-10-CM

## 2019-07-22 DIAGNOSIS — R7989 Other specified abnormal findings of blood chemistry: Secondary | ICD-10-CM

## 2019-07-22 DIAGNOSIS — E119 Type 2 diabetes mellitus without complications: Secondary | ICD-10-CM | POA: Diagnosis present

## 2019-07-22 LAB — URINALYSIS, ROUTINE W REFLEX MICROSCOPIC
Bacteria, UA: NONE SEEN
Bilirubin Urine: NEGATIVE
Glucose, UA: >=500 mg/dL — AB
Ketones, ur: NEGATIVE mg/dL
Leukocytes,Ua: NEGATIVE
Nitrite: NEGATIVE
Protein, ur: >=300 mg/dL — AB
Specific Gravity, Urine: 1.013 (ref 1.005–1.030)
pH: 6 (ref 5.0–8.0)

## 2019-07-22 LAB — BLOOD GAS, VENOUS
Acid-Base Excess: 1.7 mmol/L (ref 0.0–2.0)
Bicarbonate: 26.7 mmol/L (ref 20.0–28.0)
O2 Saturation: 70.4 %
pCO2, Ven: 45.9 mmHg (ref 44.0–60.0)
pH, Ven: 7.382 (ref 7.250–7.430)
pO2, Ven: 39.6 mmHg (ref 32.0–45.0)

## 2019-07-22 LAB — CBC WITH DIFFERENTIAL/PLATELET
Abs Immature Granulocytes: 0.01 10*3/uL (ref 0.00–0.07)
Basophils Absolute: 0.1 10*3/uL (ref 0.0–0.1)
Basophils Relative: 1 %
Eosinophils Absolute: 0.2 10*3/uL (ref 0.0–0.5)
Eosinophils Relative: 3 %
HCT: 38.4 % — ABNORMAL LOW (ref 39.0–52.0)
Hemoglobin: 13.3 g/dL (ref 13.0–17.0)
Immature Granulocytes: 0 %
Lymphocytes Relative: 30 %
Lymphs Abs: 2 10*3/uL (ref 0.7–4.0)
MCH: 30.4 pg (ref 26.0–34.0)
MCHC: 34.6 g/dL (ref 30.0–36.0)
MCV: 87.9 fL (ref 80.0–100.0)
Monocytes Absolute: 0.5 10*3/uL (ref 0.1–1.0)
Monocytes Relative: 8 %
Neutro Abs: 4 10*3/uL (ref 1.7–7.7)
Neutrophils Relative %: 58 %
Platelets: 287 10*3/uL (ref 150–400)
RBC: 4.37 MIL/uL (ref 4.22–5.81)
RDW: 12.8 % (ref 11.5–15.5)
WBC: 6.8 10*3/uL (ref 4.0–10.5)
nRBC: 0 % (ref 0.0–0.2)

## 2019-07-22 LAB — BASIC METABOLIC PANEL
Anion gap: 13 (ref 5–15)
BUN: 12 mg/dL (ref 6–20)
CO2: 25 mmol/L (ref 22–32)
Calcium: 8.8 mg/dL — ABNORMAL LOW (ref 8.9–10.3)
Chloride: 98 mmol/L (ref 98–111)
Creatinine, Ser: 2.04 mg/dL — ABNORMAL HIGH (ref 0.61–1.24)
GFR calc Af Amer: 40 mL/min — ABNORMAL LOW (ref >60)
GFR calc non Af Amer: 35 mL/min — ABNORMAL LOW (ref >60)
Glucose, Bld: 309 mg/dL — ABNORMAL HIGH (ref 70–99)
Potassium: 3 mmol/L — ABNORMAL LOW (ref 3.5–5.1)
Sodium: 136 mmol/L (ref 135–145)

## 2019-07-22 LAB — POCT GLYCOSYLATED HEMOGLOBIN (HGB A1C): HbA1c, POC (controlled diabetic range): 13.2 % — AB (ref 0.0–7.0)

## 2019-07-22 LAB — SARS CORONAVIRUS 2 BY RT PCR (HOSPITAL ORDER, PERFORMED IN ~~LOC~~ HOSPITAL LAB): SARS Coronavirus 2: NEGATIVE

## 2019-07-22 LAB — GLUCOSE, POCT (MANUAL RESULT ENTRY)

## 2019-07-22 LAB — BRAIN NATRIURETIC PEPTIDE: B Natriuretic Peptide: 230.6 pg/mL — ABNORMAL HIGH (ref 0.0–100.0)

## 2019-07-22 LAB — TROPONIN I (HIGH SENSITIVITY)
Troponin I (High Sensitivity): 52 ng/L — ABNORMAL HIGH (ref <18)
Troponin I (High Sensitivity): 55 ng/L — ABNORMAL HIGH (ref <18)

## 2019-07-22 LAB — CBG MONITORING, ED: Glucose-Capillary: 249 mg/dL — ABNORMAL HIGH (ref 70–99)

## 2019-07-22 LAB — OSMOLALITY: Osmolality: 298 mOsm/kg — ABNORMAL HIGH (ref 275–295)

## 2019-07-22 MED ORDER — ACETAMINOPHEN 650 MG RE SUPP: 650.0000 mg | Freq: Four times a day (QID) | RECTAL | Status: DC | PRN

## 2019-07-22 MED ORDER — ESCITALOPRAM OXALATE 20 MG PO TABS
20.0000 mg | ORAL_TABLET | Freq: Every day | ORAL | Status: DC
  Administered 2019-07-23 – 2019-07-24 (×2): 20 mg via ORAL
  Filled 2019-07-22 (×2): qty 1

## 2019-07-22 MED ORDER — INSULIN GLARGINE 100 UNIT/ML ~~LOC~~ SOLN
46.0000 [IU] | Freq: Every day | SUBCUTANEOUS | Status: DC
  Administered 2019-07-23 (×2): 46 [IU] via SUBCUTANEOUS
  Filled 2019-07-22 (×2): qty 0.46

## 2019-07-22 MED ORDER — AMLODIPINE BESYLATE 10 MG PO TABS
10.0000 mg | ORAL_TABLET | Freq: Every day | ORAL | Status: DC
  Administered 2019-07-23 – 2019-07-24 (×2): 10 mg via ORAL
  Filled 2019-07-22 (×2): qty 1

## 2019-07-22 MED ORDER — INSULIN ASPART 100 UNIT/ML ~~LOC~~ SOLN
0.0000 [IU] | Freq: Three times a day (TID) | SUBCUTANEOUS | Status: DC
  Administered 2019-07-23: 2 [IU] via SUBCUTANEOUS
  Administered 2019-07-23: 5 [IU] via SUBCUTANEOUS
  Administered 2019-07-23: 2 [IU] via SUBCUTANEOUS
  Administered 2019-07-24: 3 [IU] via SUBCUTANEOUS
  Filled 2019-07-22: qty 0.15

## 2019-07-22 MED ORDER — CARVEDILOL 3.125 MG PO TABS
3.1250 mg | ORAL_TABLET | Freq: Two times a day (BID) | ORAL | Status: DC
  Administered 2019-07-23 – 2019-07-24 (×3): 3.125 mg via ORAL
  Filled 2019-07-22 (×3): qty 1

## 2019-07-22 MED ORDER — LABETALOL HCL 5 MG/ML IV SOLN: 10.0000 mg | INTRAVENOUS | Status: DC | PRN

## 2019-07-22 MED ORDER — ACETAMINOPHEN 325 MG PO TABS
650.0000 mg | ORAL_TABLET | Freq: Four times a day (QID) | ORAL | Status: DC | PRN
  Administered 2019-07-23 (×2): 650 mg via ORAL
  Filled 2019-07-22 (×2): qty 2

## 2019-07-22 MED ORDER — ENOXAPARIN SODIUM 40 MG/0.4ML ~~LOC~~ SOLN
40.0000 mg | SUBCUTANEOUS | Status: DC
  Administered 2019-07-23 – 2019-07-24 (×2): 40 mg via SUBCUTANEOUS
  Filled 2019-07-22 (×2): qty 0.4

## 2019-07-22 MED ORDER — POTASSIUM CHLORIDE CRYS ER 20 MEQ PO TBCR
40.0000 meq | EXTENDED_RELEASE_TABLET | Freq: Once | ORAL | Status: AC
  Administered 2019-07-22: 40 meq via ORAL
  Filled 2019-07-22: qty 2

## 2019-07-22 MED ORDER — ONDANSETRON HCL 4 MG PO TABS: 4.0000 mg | ORAL_TABLET | Freq: Four times a day (QID) | ORAL | Status: DC | PRN

## 2019-07-22 MED ORDER — GABAPENTIN 300 MG PO CAPS
300.0000 mg | ORAL_CAPSULE | Freq: Once | ORAL | Status: AC
  Administered 2019-07-22: 300 mg via ORAL
  Filled 2019-07-22: qty 1

## 2019-07-22 MED ORDER — GABAPENTIN 300 MG PO CAPS
300.0000 mg | ORAL_CAPSULE | Freq: Three times a day (TID) | ORAL | Status: DC
  Administered 2019-07-23 – 2019-07-24 (×4): 300 mg via ORAL
  Filled 2019-07-22 (×4): qty 1

## 2019-07-22 MED ORDER — ATORVASTATIN CALCIUM 40 MG PO TABS
40.0000 mg | ORAL_TABLET | Freq: Every day | ORAL | Status: DC
  Administered 2019-07-23 – 2019-07-24 (×2): 40 mg via ORAL
  Filled 2019-07-22 (×2): qty 1

## 2019-07-22 MED ORDER — INSULIN ASPART 100 UNIT/ML ~~LOC~~ SOLN
0.0000 [IU] | Freq: Every day | SUBCUTANEOUS | Status: DC
  Administered 2019-07-23: 2 [IU] via SUBCUTANEOUS
  Filled 2019-07-22: qty 0.05

## 2019-07-22 MED ORDER — INSULIN ASPART 100 UNIT/ML ~~LOC~~ SOLN
4.0000 [IU] | Freq: Once | SUBCUTANEOUS | Status: AC
  Administered 2019-07-22: 4 [IU] via SUBCUTANEOUS
  Filled 2019-07-22: qty 0.04

## 2019-07-22 MED ORDER — ONDANSETRON HCL 4 MG/2ML IJ SOLN: 4.0000 mg | Freq: Four times a day (QID) | INTRAMUSCULAR | Status: DC | PRN

## 2019-07-22 MED ORDER — LABETALOL HCL 5 MG/ML IV SOLN
10.0000 mg | Freq: Once | INTRAVENOUS | Status: AC
  Administered 2019-07-22: 10 mg via INTRAVENOUS
  Filled 2019-07-22: qty 4

## 2019-07-22 MED ORDER — PANTOPRAZOLE SODIUM 40 MG PO TBEC
40.0000 mg | DELAYED_RELEASE_TABLET | Freq: Every day | ORAL | Status: DC | PRN
  Administered 2019-07-23: 40 mg via ORAL
  Filled 2019-07-22: qty 1

## 2019-07-22 MED ORDER — INSULIN ASPART 100 UNIT/ML ~~LOC~~ SOLN
4.0000 [IU] | Freq: Three times a day (TID) | SUBCUTANEOUS | Status: DC
  Administered 2019-07-23 – 2019-07-24 (×4): 4 [IU] via SUBCUTANEOUS
  Filled 2019-07-22: qty 0.04

## 2019-07-22 MED ORDER — LISINOPRIL 20 MG PO TABS: 20.0000 mg | ORAL_TABLET | Freq: Every day | ORAL | Status: DC

## 2019-07-22 NOTE — ED Provider Notes (Signed)
[facility] DEPT Provider Note   CSN: [id number] Arrival date & time: 07/22/19  1618     History Chief Complaint  Patient presents with  . Hyperglycemia  . Hypertension    Ryan Jenkins is a 59 y.o. male.  HPI   Patient presents to the ED for evaluation of high blood pressure and hyperglycemia.Patient has a history of hypertension hyperglycemia.  Patient states he has been taking his medications regularly.  However he has noticed that his blood sugars have been running high in the 350s over the last couple of weeks.  He also states he feels like he gets short of breath when he is talking.  About a month ago he started having decreased vision in his left eye.  Patient previously saw an ophthalmologist and was told he had issues with his retina.  He has another follow-up appointment scheduled.  Patient states he is noting same spot in the right eye and is having issues with work-up found clouding his field of vision in the left eye.  Patient went to his doctor's office today for a follow-up appointment.  [facility]  While there he was noted to be very hypertensive and had a blood sugar greater than 600.  They called EMS to transport him to the emergency room.  Patient denies any fevers or chills.  He denies any abdominal pain.  He does have history of intermittent vomiting and diarrhea but that has been going on for several months.  Patient attributes that to an ulcer and possibly his medications.  Medical records indicate previously the patient had not been entirely compliant with his medication regimen.  He has also been diagnosed with retinopathy and was referred to [facility] ophthalmology.  Past Medical History:  Diagnosis Date  . Abdominal pain 11/25/2018  . Acid reflux 11/25/2018  . Anxiety   . Bilateral swelling of feet and ankles 11/25/2018   and legs  . Change in bowel habits 11/25/2018  . Constipation  11/25/2018  . Depression   . Diabetes mellitus without complication ([place])   . Diabetic neuropathy ([place])   . Diarrhea 11/25/2018  . Fatigue 11/25/2018  . GERD (gastroesophageal reflux disease)   . Hypertension   . Loss of appetite 11/25/2018  . Muscle spasm 11/25/2018  . Neuromuscular disorder (HCC)    neuropathy  . Retinopathy due to secondary diabetes ([place])   . Sleep disturbances 11/25/2018  . Vision changes 11/25/2018  . Vitamin B12 deficiency     Patient Active Problem List   Diagnosis Date Noted  . History of medication noncompliance 04/02/2019  . Gastric ulcer without hemorrhage or perforation 12/25/2018  . Stage 3a chronic kidney disease 12/25/2018  . Gastroesophageal reflux disease without esophagitis 09/04/2018  . 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 Intermittent diarrhea 09/27/2017  . Gastroparesis 09/27/2017  . Macroalbuminuric diabetic nephropathy ([place]) 06/25/2017  . History of falling 06/25/2017  . Hyperlipidemia 03/21/2017  . Vitamin B 12 deficiency 03/21/2017  . Uncontrolled type 2 diabetes mellitus with peripheral neuropathy ([place]) 02/05/2017  . Essential hypertension 02/05/2017  . Depression 02/05/2017  . Unintended weight loss 02/05/2017  . Gait disturbance 02/05/2017  . 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 Diabetic neuropathy, type II diabetes mellitus ([place]) 05/11/2014  . 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 CATARACT EXTRACTION Bilateral 2017  Family History  Problem Relation Age of Onset  . Renal cancer Mother   . Hypertension Mother   . Pancreatic cancer Mother   . Hypertension Sister   . Stroke Sister   . Leukemia Maternal Uncle   . Sickle cell trait Maternal Aunt   . Colon cancer Neg Hx   . Esophageal cancer Neg Hx   . Rectal cancer Neg Hx   . Stomach cancer Neg Hx     Social History   Tobacco Use  . Smoking  status: Never Smoker  . Smokeless tobacco: Never Used  Vaping Use  . Vaping Use: Never used  Substance Use Topics  . Alcohol use: No    Alcohol/week: 0.0 standard drinks  . Drug use: No    Home Medications Prior to Admission medications   Medication Sig Start Date End Date Taking? Authorizing Provider  amLODipine (NORVASC) 10 MG tablet Take 1 tablet (10 mg total) by mouth daily. 09/04/18  Yes Ladell Pier, MD  atorvastatin (LIPITOR) 40 MG tablet Take 1 tablet (40 mg total) by mouth daily. 09/04/18  Yes Ladell Pier, MD  carvedilol (COREG) 3.125 MG tablet Take 1 tablet (3.125 mg total) by mouth 2 (two) times daily with a meal. 12/27/18  Yes Ladell Pier, MD  cyclobenzaprine (FLEXERIL) 5 MG tablet TAKE 1 TABLET (5 MG TOTAL) BY MOUTH 2 (TWO) TIMES DAILY AS NEEDED FOR MUSCLE SPASMS (MEDICATION CAN CAUSE DROWSINESS). 12/10/18  Yes Ladell Pier, MD  escitalopram (LEXAPRO) 20 MG tablet Take 20 mg by mouth daily. 04/02/19  Yes [provider]  gabapentin (NEURONTIN) 300 MG capsule TAKE 2 CAPSULES BY MOUTH in evening and 1 cap Q a.m Patient taking differently: Take 300 mg by mouth 3 (three) times daily.  12/27/18  Yes Ladell Pier, MD  Insulin Glargine (LANTUS SOLOSTAR) 100 UNIT/ML Solostar Pen Inject 46 Units into the skin daily at 10 pm. 07/03/18  Yes Ladell Pier, MD  insulin lispro (HUMALOG KWIKPEN) 100 UNIT/ML KwikPen 14 units subcu before breakfast and lunch, 17 units with dinner. 09/04/18  Yes Ladell Pier, MD  lisinopril (ZESTRIL) 20 MG tablet Take 1 tablet (20 mg total) by mouth daily. 04/02/19  Yes Ladell Pier, MD  ondansetron (ZOFRAN) 4 MG tablet TAKE 1 TABLET (4 MG TOTAL) BY MOUTH EVERY 8 (EIGHT) HOURS AS NEEDED FOR NAUSEA OR VOMITING. 04/02/19  Yes Ladell Pier, MD  vitamin B-12 (CYANOCOBALAMIN) 1000 MCG tablet Take 1 tablet (1,000 mcg total) by mouth daily. 06/03/18  Yes Ladell Pier, MD  Blood Glucose Monitoring Suppl (TRUE  METRIX METER) w/Device KIT Use as directed 02/05/17   Ladell Pier, MD  DULoxetine (CYMBALTA) 20 MG capsule Take 1 capsule (20 mg total) by mouth daily. Patient not taking: Reported on 07/22/2019 04/02/19   Ladell Pier, MD  glucose blood test strip Use as instructed 07/28/18   Ladell Pier, MD  Insulin Pen Needle (PEN NEEDLES) 31G X 8 MM MISC Use as directed 02/05/17   Ladell Pier, MD  pantoprazole (PROTONIX) 20 MG tablet Take 20 mg by mouth daily. Patient not taking: Reported on 07/22/2019 04/02/19   [provider]  pantoprazole (PROTONIX) 40 MG tablet Take 1 tablet (40 mg total) by mouth daily. Patient taking differently: Take 40 mg by mouth daily as needed (indigestion).  04/02/19   Ladell Pier, MD  TRUEPLUS LANCETS 28G MISC Use as directed 02/05/17   Ladell Pier, MD  Allergies    Claritin [loratadine], Hydrochlorothiazide, Lyrica [pregabalin], and Metformin and related  Review of Systems   Review of Systems  All other systems reviewed and are negative.   Physical Exam Updated Vital Signs BP (!) 161/112   Pulse 87   Temp 99.1 F (37.3 C) (Oral)   Resp 20   SpO2 99%   Physical Exam Vitals and nursing note reviewed.  Constitutional:      General: He is not in acute distress.    Appearance: He is well-developed.  HENT:     Head: Normocephalic and atraumatic.     Right Ear: External ear normal.     Left Ear: External ear normal.  Eyes:     General: No scleral icterus.       Right eye: No discharge.        Left eye: No discharge.     Conjunctiva/sclera: Conjunctivae normal.     Comments: Decreased visual acuity left eye  Neck:     Trachea: No tracheal deviation.  Cardiovascular:     Rate and Rhythm: Normal rate and regular rhythm.  Pulmonary:     Effort: Pulmonary effort is normal. No respiratory distress.     Breath sounds: Normal breath sounds. No stridor. No wheezing or rales.  Abdominal:     General: Bowel sounds are  normal. There is no distension.     Palpations: Abdomen is soft.     Tenderness: There is no abdominal tenderness. There is no guarding or rebound.  Musculoskeletal:        General: No tenderness.     Cervical back: Neck supple.  Skin:    General: Skin is warm and dry.     Findings: No rash.  Neurological:     General: No focal deficit present.     Mental Status: He is alert and oriented to person, place, and time.     Cranial Nerves: No cranial nerve deficit (no facial droop, extraocular movements intact, no slurred speech).     Sensory: No sensory deficit.     Motor: No abnormal muscle tone or seizure activity.     Coordination: Coordination normal.     ED Results / Procedures / Treatments   Labs (all labs ordered are listed, but only abnormal results are displayed) Labs Reviewed  BASIC METABOLIC PANEL - Abnormal; Notable for the following components:      Result Value   Potassium 3.0 (*)    Glucose, Bld 309 (*)    Creatinine, Ser 2.04 (*)    Calcium 8.8 (*)    GFR calc non Af Amer 35 (*)    GFR calc Af Amer 40 (*)    All other components within normal limits  CBC WITH DIFFERENTIAL/PLATELET - Abnormal; Notable for the following components:   HCT 38.4 (*)    All other components within normal limits  URINALYSIS, ROUTINE W REFLEX MICROSCOPIC - Abnormal; Notable for the following components:   Color, Urine STRAW (*)    Glucose, UA >=500 (*)    Hgb urine dipstick SMALL (*)    Protein, ur >=300 (*)    All other components within normal limits  OSMOLALITY - Abnormal; Notable for the following components:   Osmolality 298 (*)    All other components within normal limits  BRAIN NATRIURETIC PEPTIDE - Abnormal; Notable for the following components:   B Natriuretic Peptide 230.6 (*)    All other components within normal limits  CBG MONITORING, ED - Abnormal; Notable for  the following components:   Glucose-Capillary 249 (*)    All other components within normal limits   TROPONIN I (HIGH SENSITIVITY) - Abnormal; Notable for the following components:   Troponin I (High Sensitivity) 52 (*)    All other components within normal limits  TROPONIN I (HIGH SENSITIVITY) - Abnormal; Notable for the following components:   Troponin I (High Sensitivity) 55 (*)    All other components within normal limits  SARS CORONAVIRUS 2 BY RT PCR (HOSPITAL ORDER, [facility])  BLOOD GAS, VENOUS    EKG EKG Interpretation  Date/Time:  Wednesday July 22 2019 16:59:24 EDT Ventricular Rate:  103 PR Interval:    QRS Duration: 98 QT Interval:  363 QTC Calculation: 476 R Axis:   -20 Text Interpretation: Sinus tachycardia LVH with secondary repolarization abnormality Anterior Q waves, possibly due to LVH Since last tracing rate slower Confirmed by Dorie Rank [phone]) on 07/22/2019 5:06:18 PM   Radiology CT Head Wo Contrast  Result Date: 07/22/2019 CLINICAL DATA:  Neuro deficit, acute, stroke suspected. Additional history provided: Unable to see out of left eye for 3 weeks. EXAM: CT HEAD WITHOUT CONTRAST TECHNIQUE: Contiguous axial images were obtained from the base of the skull through the vertex without intravenous contrast. COMPARISON:  No pertinent prior studies available for comparison. FINDINGS: Brain: Cerebral volume is normal for age. There is no acute intracranial hemorrhage. No demarcated cortical infarct is identified. No extra-axial fluid collection. No evidence of intracranial mass. No midline shift. Vascular: No hyperdense vessel.  Atherosclerotic calcifications. Skull: Normal. Negative for fracture or focal lesion. Sinuses/Orbits: Visualized orbits show no acute finding. Mild paranasal sinus mucosal thickening, most notably within bilateral ethmoid air cells. No significant mastoid effusion. IMPRESSION: No CT evidence of acute intracranial abnormality. Mild paranasal sinus mucosal thickening, most notably ethmoidal. Electronically Signed   By: Kellie Simmering DO   On: 07/22/2019 18:37   DG Chest Portable 1 View  Result Date: 07/22/2019 CLINICAL DATA:  Elevated blood glucose levels. EXAM: PORTABLE CHEST 1 VIEW COMPARISON:  January 12, 2016 FINDINGS: The heart size and mediastinal contours are within normal limits. Both lungs are clear. The visualized skeletal structures are unremarkable. IMPRESSION: No active disease. Electronically Signed   By: Virgina Norfolk M.D.   On: 07/22/2019 17:45    Procedures Procedures (including critical care time)  Medications Ordered in ED Medications  gabapentin (NEURONTIN) capsule 300 mg (has no administration in time range)  insulin aspart (novoLOG) injection 4 Units (4 Units Subcutaneous Given 07/22/19 1931)  potassium chloride SA (KLOR-CON) CR tablet 40 mEq (40 mEq Oral Given 07/22/19 1930)    ED Course  I have reviewed the triage vital signs and the nursing notes.  Pertinent labs & imaging results that were available during my care of the patient were reviewed by me and considered in my medical decision making (see chart for details).  Clinical Course as of Jul 22 2231  Wed Jul 22, 2019  1655 Blood pressure reviewed.  144/88.  Decreased from the primary care doctor's office   [JK]  1733 Blood sugar is 249.  Decreased from the doctor's office   [JK]  1829 BNP normal.  Initial troponin elevated.   [JK]  1830 Hyperglycemia, hypokalemia noted.  Creatinine increasing compared to previous values.   [JK]  2231 Second troponin is unchanged   [JK]    Clinical Course User Index [JK] Dorie Rank, MD   MDM Rules/Calculators/A&P  Patient presented to the ED for evaluation of hyperglycemia and poorly controlled blood pressure.  Patient indicated that he had been having issues with shortness of breath.  In the ED the patient appeared comfortable and his blood pressure had decreased from the level at the doctor's office.  Patient's laboratory tests however do show an elevated  troponin and BNP.  Troponin elevation may be related to his poorly controlled hypertension and less likely from acute coronary syndrome.  However with his symptoms, renal insufficiency elevated troponin hyperglycemia and hypertension it is reasonable to bring her in for serial enzymes, possible cardiac consultation and stress testing.  Visual issues may be related to diabetic retinopathy, retinal detachment and less likely  occult stroke I will consult with the medical service for admission.   Final Clinical Impression(s) / ED Diagnoses Final diagnoses:  Dyspnea, unspecified type  Elevated troponin  Hypertension, unspecified type  Hyperglycemia      Dorie Rank, MD 07/22/19 2256

## 2019-07-22 NOTE — ED Triage Notes (Signed)
Per EMS: Patient is coming from his PCP office. Patient's CBG was greater than 600 at the office. Patient reports he has not had his insulin adjusted in a while and has been taking medication as prescribed. Patient also had not been able to see out of his left eye x3 weeks.

## 2019-07-22 NOTE — Progress Notes (Signed)
Pt states he has been having dizziness for a month  Pt states he hasn't taken anything for it

## 2019-07-22 NOTE — Progress Notes (Signed)
Patient ID: Ryan Jenkins, male   DOB: [birth year], 59 y.o.   MRN: [id number]   Ryan Jenkins, is a 59 y.o. male  QDI:[id number]  MMH:[id number]  DOB - July 13, 1960  Subjective:  Chief Complaint and HPI: Ryan Jenkins is a 59 y.o. male here today as an add on to my schedule.  He complains of dizziness for 1 month.  Says he is compliant with his medications but blood sugars running ~350s at home.  He denies CP/SOB.  He also c/o loss of vision in L eye-also for about 1 month.    Due to BP and blood sugar >600, I was gotten from a room with another patient.  EMS activated after briefly talking to and examining the patient.  He was unable to give [name] a urine  ROS:   Constitutional:  No f/c, No night sweats, No unexplained weight loss. EENT:  No hearing changes. No mouth, throat, or ear problems.  Respiratory: No cough, No SOB Cardiac: No CP, no palpitations GI:  No abd pain, No N/V/D. GU: No Urinary s/sx Musculoskeletal: No joint pain Neuro: No headache, + dizziness, +weakness.  Skin: No rash Endocrine:  No polydipsia. No polyuria.  Psych: Denies SI/HI  No problems updated.  ALLERGIES: Allergies  Allergen Reactions  . Claritin [Loratadine] Swelling    Joint swelling   . Hydrochlorothiazide     Dizziness  . Lyrica [Pregabalin]     depression  . Metformin And Related     GI    PAST MEDICAL HISTORY: Past Medical History:  Diagnosis Date  . Abdominal pain 11/25/2018  . Acid reflux 11/25/2018  . Anxiety   . Bilateral swelling of feet and ankles 11/25/2018   and legs  . Change in bowel habits 11/25/2018  . Constipation 11/25/2018  . Depression   . Diabetes mellitus without complication ([place])   . Diabetic neuropathy ([place])   . Diarrhea 11/25/2018  . Fatigue 11/25/2018  . GERD (gastroesophageal reflux disease)   . Hypertension   . Loss of appetite 11/25/2018  . Muscle spasm 11/25/2018  . Neuromuscular disorder (HCC)    neuropathy  . Retinopathy due to secondary  diabetes ([place])   . Sleep disturbances 11/25/2018  . Vision changes 11/25/2018  . Vitamin B12 deficiency     MEDICATIONS AT HOME: Prior to Admission medications   Medication Sig Start Date End Date Taking? Authorizing Provider  amLODipine (NORVASC) 10 MG tablet Take 1 tablet (10 mg total) by mouth daily. 09/04/18   Ladell Pier, MD  atorvastatin (LIPITOR) 40 MG tablet Take 1 tablet (40 mg total) by mouth daily. 09/04/18   Ladell Pier, MD  Blood Glucose Monitoring Suppl (TRUE METRIX METER) w/Device KIT Use as directed 02/05/17   Ladell Pier, MD  carvedilol (COREG) 3.125 MG tablet Take 1 tablet (3.125 mg total) by mouth 2 (two) times daily with a meal. 12/27/18   Ladell Pier, MD  cyclobenzaprine (FLEXERIL) 5 MG tablet TAKE 1 TABLET (5 MG TOTAL) BY MOUTH 2 (TWO) TIMES DAILY AS NEEDED FOR MUSCLE SPASMS (MEDICATION CAN CAUSE DROWSINESS). 12/10/18   Ladell Pier, MD  DULoxetine (CYMBALTA) 20 MG capsule Take 1 capsule (20 mg total) by mouth daily. 04/02/19   Ladell Pier, MD  gabapentin (NEURONTIN) 300 MG capsule TAKE 2 CAPSULES BY MOUTH in evening and 1 cap Q a.m 12/27/18   Ladell Pier, MD  glucose blood test strip Use as instructed 07/28/18   Ladell Pier, MD  Insulin  Glargine (LANTUS SOLOSTAR) 100 UNIT/ML Solostar Pen Inject 46 Units into the skin daily at 10 pm. 07/03/18   Ladell Pier, MD  insulin lispro (HUMALOG KWIKPEN) 100 UNIT/ML KwikPen 14 units subcu before breakfast and lunch, 17 units with dinner. 09/04/18   Ladell Pier, MD  Insulin Pen Needle (PEN NEEDLES) 31G X 8 MM MISC Use as directed 02/05/17   Ladell Pier, MD  lisinopril (ZESTRIL) 20 MG tablet Take 1 tablet (20 mg total) by mouth daily. 04/02/19   Ladell Pier, MD  ondansetron (ZOFRAN) 4 MG tablet TAKE 1 TABLET (4 MG TOTAL) BY MOUTH EVERY 8 (EIGHT) HOURS AS NEEDED FOR NAUSEA OR VOMITING. 04/02/19   Ladell Pier, MD  pantoprazole (PROTONIX) 40 MG tablet Take 1  tablet (40 mg total) by mouth daily. 04/02/19   Ladell Pier, MD  TRUEPLUS LANCETS 28G MISC Use as directed 02/05/17   Ladell Pier, MD  vitamin B-12 (CYANOCOBALAMIN) 1000 MCG tablet Take 1 tablet (1,000 mcg total) by mouth daily. 06/03/18   Ladell Pier, MD     Objective:  EXAM:   Vitals:   07/22/19 1535  BP: (!) 210/128  Pulse: (!) 109  Resp: 16  Temp: 98.2 F (36.8 C)  SpO2: 99%  Weight: 226 lb (102.5 kg)    General appearance : A&OX3. NAD. Non-toxic-appearing; appears fine HEENT: Atraumatic and Normocephalic.  PERRLA. EOM intact. Neck: supple, no JVD. No cervical lymphadenopathy. No thyromegaly Chest/Lungs:  Breathing-non-labored, Good air entry bilaterally, breath sounds normal without rales, rhonchi, or wheezing  CVS: S1 S2 regular, no murmurs, gallops, rubs  Extremities: Bilateral Lower Ext shows no edema, both legs are warm to touch with = pulse throughout Neurology:  CN II-XII grossly intact, Non focal.   Psych:  TP linear. J/I fair. Normal speech. Appropriate eye contact and affect.  Skin:  No Rash  Data Review Lab Results  Component Value Date   HGBA1C 13.2 (A) 07/22/2019   HGBA1C  04/02/2019     Comment:     >15.0   HGBA1C 11.8 (A) 12/25/2018     Assessment & Plan   1. Uncontrolled type 2 diabetes mellitus with peripheral neuropathy ([place]) Due to BP and blood sugar >600, I was gotten from a room with another patient.  EMS activated after briefly talking to and examining the patient.  EMS arrived within 5 mins of me seeing the patient and discussing with Dr Margarita Rana.   - Glucose (CBG) - HgB A1c  2. Hypertensive urgency Due to BP and blood sugar >600, I was gotten from a room with another patient.  EMS activated after briefly talking to and examining the patient.    3. Dizziness Due to BP and blood sugar >600, I was gotten from a room with another patient.  EMS activated after briefly talking to and examining the patient.    4. Vision loss of  left eye Due to BP and blood sugar >600, I was gotten from a room with another patient.  EMS activated after briefly talking to and examining the patient.     Patient have been counseled extensively about nutrition and exercise  Return in about 3 weeks (around 08/12/2019) for PCP;  chronic conditions and hospital f/up.  The patient was given clear instructions to go to ER or return to medical center if symptoms don't improve, worsen or new problems develop. The patient verbalized understanding. The patient was told to call to get lab results if they haven't  heard anything in the next week.     Stembridge Caldron, PA-C [facility] and [place], [place]   07/22/2019, 3:49 PM

## 2019-07-22 NOTE — H&P (Addendum)
History and Physical    Ryan Jenkins:[id number] DOB: 02/17/1960 DOA: 07/22/2019  PCP: Ladell Pier, MD  Patient coming from: Home  I have personally briefly reviewed patient's old medical records in [place]  Chief Complaint: HTN, hyperglycemia  HPI: Ryan Jenkins is a 59 y.o. male with medical history significant of poorly controlled DM2 and HTN.  Pt states he has been taking meds regularly.  Despite this control has been poor.  BGLs running 350s over last couple of weeks.  SOB with talking.  About 1 month ago started having decreased vision (described as blurry vision) in L eye.  This is in addition to decreased vision (areas of black spots / streaks "like someone took a black paint brush") in R eye that has been ongoing.  Pt went to PCP office, CBG > 600.  Sent in to ED.  Says he did take 46u lantus last night and takes this every night without missing he tells me.  Lantus is kept in refrigerator.  Intermittent vomiting and diarrhea for past couple of months.   ED Course: CT head neg.  CBG in 300s, given 4u novolog.   BPs running 210/128, improved to 161/112 after 68m labetalol.  K 3.0 replaced.  Review of Systems: As per HPI, otherwise all review of systems negative.  Past Medical History:  Diagnosis Date  . Abdominal pain 11/25/2018  . Acid reflux 11/25/2018  . Anxiety   . Bilateral swelling of feet and ankles 11/25/2018   and legs  . Change in bowel habits 11/25/2018  . Constipation 11/25/2018  . Depression   . Diabetes mellitus without complication ([facility]   . Diabetic neuropathy ([facility]   . Diarrhea 11/25/2018  . Fatigue 11/25/2018  . GERD (gastroesophageal reflux disease)   . Hypertension   . Loss of appetite 11/25/2018  . Muscle spasm 11/25/2018  . Neuromuscular disorder (HCC)    neuropathy  . Retinopathy due to secondary diabetes ([facility]   . Sleep disturbances 11/25/2018  . Vision changes 11/25/2018  . Vitamin B12 deficiency      Past Surgical History:  Procedure Laterality Date  . CATARACT EXTRACTION Bilateral 2017     reports that he has never smoked. He has never used smokeless tobacco. He reports that he does not drink alcohol and does not use drugs.  Allergies  Allergen Reactions  . Claritin [Loratadine] Swelling    Joint swelling   . Hydrochlorothiazide     Dizziness  . Lyrica [Pregabalin]     depression  . Metformin And Related     GI    Family History  Problem Relation Age of Onset  . Renal cancer Mother   . Hypertension Mother   . Pancreatic cancer Mother   . Hypertension Sister   . Stroke Sister   . Leukemia Maternal Uncle   . Sickle cell trait Maternal Aunt   . Colon cancer Neg Hx   . Esophageal cancer Neg Hx   . Rectal cancer Neg Hx   . Stomach cancer Neg Hx      Prior to Admission medications   Medication Sig Start Date End Date Taking? Authorizing Provider  amLODipine (NORVASC) 10 MG tablet Take 1 tablet (10 mg total) by mouth daily. 09/04/18  Yes JLadell Pier MD  atorvastatin (LIPITOR) 40 MG tablet Take 1 tablet (40 mg total) by mouth daily. 09/04/18  Yes JLadell Pier MD  carvedilol (COREG) 3.125 MG tablet Take 1 tablet (3.125 mg total) by  mouth 2 (two) times daily with a meal. 12/27/18  Yes Ladell Pier, MD  cyclobenzaprine (FLEXERIL) 5 MG tablet TAKE 1 TABLET (5 MG TOTAL) BY MOUTH 2 (TWO) TIMES DAILY AS NEEDED FOR MUSCLE SPASMS (MEDICATION CAN CAUSE DROWSINESS). 12/10/18  Yes Ladell Pier, MD  escitalopram (LEXAPRO) 20 MG tablet Take 20 mg by mouth daily. 04/02/19  Yes [provider]  gabapentin (NEURONTIN) 300 MG capsule TAKE 2 CAPSULES BY MOUTH in evening and 1 cap Q a.m Patient taking differently: Take 300 mg by mouth 3 (three) times daily.  12/27/18  Yes Ladell Pier, MD  Insulin Glargine (LANTUS SOLOSTAR) 100 UNIT/ML Solostar Pen Inject 46 Units into the skin daily at 10 pm. 07/03/18  Yes Ladell Pier, MD  insulin lispro  (HUMALOG KWIKPEN) 100 UNIT/ML KwikPen 14 units subcu before breakfast and lunch, 17 units with dinner. 09/04/18  Yes Ladell Pier, MD  lisinopril (ZESTRIL) 20 MG tablet Take 1 tablet (20 mg total) by mouth daily. 04/02/19  Yes Ladell Pier, MD  ondansetron (ZOFRAN) 4 MG tablet TAKE 1 TABLET (4 MG TOTAL) BY MOUTH EVERY 8 (EIGHT) HOURS AS NEEDED FOR NAUSEA OR VOMITING. 04/02/19  Yes Ladell Pier, MD  vitamin B-12 (CYANOCOBALAMIN) 1000 MCG tablet Take 1 tablet (1,000 mcg total) by mouth daily. 06/03/18  Yes Ladell Pier, MD  Blood Glucose Monitoring Suppl (TRUE METRIX METER) w/Device KIT Use as directed 02/05/17   Ladell Pier, MD  glucose blood test strip Use as instructed 07/28/18   Ladell Pier, MD  Insulin Pen Needle (PEN NEEDLES) 31G X 8 MM MISC Use as directed 02/05/17   Ladell Pier, MD  pantoprazole (PROTONIX) 40 MG tablet Take 1 tablet (40 mg total) by mouth daily. Patient taking differently: Take 40 mg by mouth daily as needed (indigestion).  04/02/19   Ladell Pier, MD  TRUEPLUS LANCETS 28G MISC Use as directed 02/05/17   Ladell Pier, MD    Physical Exam: Vitals:   07/22/19 1816 07/22/19 2030 07/22/19 2155 07/22/19 2200  BP: (!) 168/68 (!) 170/129 (!) 188/100 (!) 161/112  Pulse: 98 89 87 87  Resp: 17 16 15 20   Temp:      TempSrc:      SpO2: 95% 100% 100% 99%    Constitutional: NAD, calm, comfortable Eyes: PERRL, lids and conjunctivae normal ENMT: Mucous membranes are moist. Posterior pharynx clear of any exudate or lesions.Normal dentition.  Neck: normal, supple, no masses, no thyromegaly Respiratory: clear to auscultation bilaterally, no wheezing, no crackles. Normal respiratory effort. No accessory muscle use.  Cardiovascular: Regular rate and rhythm, no murmurs / rubs / gallops. No extremity edema. 2+ pedal pulses. No carotid bruits.  Abdomen: no tenderness, no masses palpated. No hepatosplenomegaly. Bowel sounds positive.   Musculoskeletal: no clubbing / cyanosis. No joint deformity upper and lower extremities. Good ROM, no contractures. Normal muscle tone.  Skin: No diabetic foot ulcer Neurologic: CN 2-12 grossly intact. Sensation intact, DTR normal. Strength 5/5 in all 4.  Psychiatric: Normal judgment and insight. Alert and oriented x 3. Normal mood.    Labs on Admission: I have personally reviewed following labs and imaging studies  CBC: Recent Labs  Lab 07/22/19 1711  WBC 6.8  NEUTROABS 4.0  HGB 13.3  HCT 38.4*  MCV 87.9  PLT 325   Basic Metabolic Panel: Recent Labs  Lab 07/22/19 1711  NA 136  K 3.0*  CL 98  CO2 25  GLUCOSE  309*  BUN 12  CREATININE 2.04*  CALCIUM 8.8*   GFR: CrCl cannot be calculated (Unknown ideal weight.). Liver Function Tests: No results for input(s): AST, ALT, ALKPHOS, BILITOT, PROT, ALBUMIN in the last 168 hours. No results for input(s): LIPASE, AMYLASE in the last 168 hours. No results for input(s): AMMONIA in the last 168 hours. Coagulation Profile: No results for input(s): INR, PROTIME in the last 168 hours. Cardiac Enzymes: No results for input(s): CKTOTAL, CKMB, CKMBINDEX, TROPONINI in the last 168 hours. BNP (last 3 results) No results for input(s): PROBNP in the last 8760 hours. HbA1C: Recent Labs    07/22/19 1538  HGBA1C 13.2*   CBG: Recent Labs  Lab 07/22/19 1708  GLUCAP 249*   Lipid Profile: No results for input(s): CHOL, HDL, LDLCALC, TRIG, CHOLHDL, LDLDIRECT in the last 72 hours. Thyroid Function Tests: No results for input(s): TSH, T4TOTAL, FREET4, T3FREE, THYROIDAB in the last 72 hours. Anemia Panel: No results for input(s): VITAMINB12, FOLATE, FERRITIN, TIBC, IRON, RETICCTPCT in the last 72 hours. Urine analysis:    Component Value Date/Time   COLORURINE STRAW (A) 07/22/2019 2000   APPEARANCEUR CLEAR 07/22/2019 2000   LABSPEC 1.013 07/22/2019 2000   PHURINE 6.0 07/22/2019 2000   GLUCOSEU >=500 (A) 07/22/2019 2000   HGBUR  SMALL (A) 07/22/2019 2000   BILIRUBINUR NEGATIVE 07/22/2019 2000   KETONESUR NEGATIVE 07/22/2019 2000   PROTEINUR >=300 (A) 07/22/2019 2000   NITRITE NEGATIVE 07/22/2019 2000   LEUKOCYTESUR NEGATIVE 07/22/2019 2000    Radiological Exams on Admission: CT Head Wo Contrast  Result Date: 07/22/2019 CLINICAL DATA:  Neuro deficit, acute, stroke suspected. Additional history provided: Unable to see out of left eye for 3 weeks. EXAM: CT HEAD WITHOUT CONTRAST TECHNIQUE: Contiguous axial images were obtained from the base of the skull through the vertex without intravenous contrast. COMPARISON:  No pertinent prior studies available for comparison. FINDINGS: Brain: Cerebral volume is normal for age. There is no acute intracranial hemorrhage. No demarcated cortical infarct is identified. No extra-axial fluid collection. No evidence of intracranial mass. No midline shift. Vascular: No hyperdense vessel.  Atherosclerotic calcifications. Skull: Normal. Negative for fracture or focal lesion. Sinuses/Orbits: Visualized orbits show no acute finding. Mild paranasal sinus mucosal thickening, most notably within bilateral ethmoid air cells. No significant mastoid effusion. IMPRESSION: No CT evidence of acute intracranial abnormality. Mild paranasal sinus mucosal thickening, most notably ethmoidal. Electronically Signed   By: Kellie Simmering DO   On: 07/22/2019 18:37   DG Chest Portable 1 View  Result Date: 07/22/2019 CLINICAL DATA:  Elevated blood glucose levels. EXAM: PORTABLE CHEST 1 VIEW COMPARISON:  January 12, 2016 FINDINGS: The heart size and mediastinal contours are within normal limits. Both lungs are clear. The visualized skeletal structures are unremarkable. IMPRESSION: No active disease. Electronically Signed   By: Virgina Norfolk M.D.   On: 07/22/2019 17:45    EKG: Independently reviewed.  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 Vision loss L eye - 1. Subacute over past 1 month 2. Blurry vision ? Diabetic retinopathy, stroke?\ 3. Suspicion for diabetic retinopathy with macular edema, especially given the classic "patchy" vision loss  in other eye. 4. MRI brain 5. Call optho in AM for retinal exam, treatment, and recs 2. Vision loss R eye - 1. This one sounds like a classic description of diabetic retinopathy with "patchy" areas of vision loss. 2. Optho eval in AM as above 3. Uncontrolled DM2 - 1. Cont Lantus 46u QHS for the moment, until we know what fasting CBG is.  Almost certainly going to need to increase though. 2. Add mod scale SSI AC/HS and 2am CBG check 3. Add 4u novolog mealtime. 4. Probably needs more adjustment. 5. Diabetes coordinator consult 4. Uncontrolled HTN - 1. Resume all home BP meds 2. PRN labetalol if needed 5. Elevated troponin - 1. Serial trops 2. Tele monitor 3. Consider calling cards in AM to see if they want to stress test. 6. CKD 3, nephropathy due to DM and HTN - 1. Creat of 2.0 is essentially unchanged from the 1.9 in Dec. 2. Need to control DM and HTN better 3. Repeat BMP in AM  DVT prophylaxis: Lovenox Code Status: Full Family Communication: Family at bedside Disposition Plan: Home after DM2 controlled, and optho exam / treatment plan Consults called: None, call optho in AM Admission status: Place in obs for now   Jasira Robinson, [facility]  How to contact the [facility] Attending or Consulting provider [place] or covering provider during after hours [place], for this patient?  1. Check the care team in [facility] and look for a) attending/consulting [facility] provider listed and b)  the [facility] team listed 2. Log into [email or web address]  Amion Physician Scheduling and messaging for groups and whole hospitals  On call and physician scheduling software for group practices, residents, hospitalists and other medical providers for call, clinic, rotation and shift schedules. On[employer] is a hospital-wide system for scheduling doctors and paging doctors on call. EasyPlot is for scientific plotting and data analysis.  [email or web address]  and use [place]'s universal password to access. If you do not have the password, please contact the hospital operator.  3. Locate the [facility] provider you are looking for under [facility] and page to a number that you can be directly reached. 4. If you still have difficulty reaching the provider, please page the [facility] (Director on Call) for the Hospitalists listed on amion for assistance.  07/22/2019, 11:09 PM

## 2019-07-23 ENCOUNTER — Observation Stay (HOSPITAL_COMMUNITY): Payer: Medicaid Other

## 2019-07-23 ENCOUNTER — Observation Stay (HOSPITAL_BASED_OUTPATIENT_CLINIC_OR_DEPARTMENT_OTHER): Payer: Medicaid Other

## 2019-07-23 DIAGNOSIS — I160 Hypertensive urgency: Secondary | ICD-10-CM

## 2019-07-23 DIAGNOSIS — R0602 Shortness of breath: Secondary | ICD-10-CM

## 2019-07-23 DIAGNOSIS — E1165 Type 2 diabetes mellitus with hyperglycemia: Secondary | ICD-10-CM

## 2019-07-23 DIAGNOSIS — R778 Other specified abnormalities of plasma proteins: Secondary | ICD-10-CM

## 2019-07-23 DIAGNOSIS — N1832 Chronic kidney disease, stage 3b: Secondary | ICD-10-CM | POA: Diagnosis not present

## 2019-07-23 DIAGNOSIS — I10 Essential (primary) hypertension: Secondary | ICD-10-CM

## 2019-07-23 DIAGNOSIS — E11319 Type 2 diabetes mellitus with unspecified diabetic retinopathy without macular edema: Secondary | ICD-10-CM | POA: Diagnosis not present

## 2019-07-23 DIAGNOSIS — H4312 Vitreous hemorrhage, left eye: Secondary | ICD-10-CM | POA: Diagnosis not present

## 2019-07-23 DIAGNOSIS — E1142 Type 2 diabetes mellitus with diabetic polyneuropathy: Secondary | ICD-10-CM

## 2019-07-23 LAB — HEMOGLOBIN A1C
Hgb A1c MFr Bld: 13.3 % — ABNORMAL HIGH (ref 4.8–5.6)
Mean Plasma Glucose: 335.01 mg/dL

## 2019-07-23 LAB — ECHOCARDIOGRAM COMPLETE: Weight: 3576.74 oz

## 2019-07-23 LAB — BASIC METABOLIC PANEL
Anion gap: 11 (ref 5–15)
BUN: 12 mg/dL (ref 6–20)
CO2: 27 mmol/L (ref 22–32)
Calcium: 8.8 mg/dL — ABNORMAL LOW (ref 8.9–10.3)
Chloride: 100 mmol/L (ref 98–111)
Creatinine, Ser: 1.82 mg/dL — ABNORMAL HIGH (ref 0.61–1.24)
GFR calc Af Amer: 46 mL/min — ABNORMAL LOW (ref >60)
GFR calc non Af Amer: 40 mL/min — ABNORMAL LOW (ref >60)
Glucose, Bld: 189 mg/dL — ABNORMAL HIGH (ref 70–99)
Potassium: 3.2 mmol/L — ABNORMAL LOW (ref 3.5–5.1)
Sodium: 138 mmol/L (ref 135–145)

## 2019-07-23 LAB — GLUCOSE, CAPILLARY
Glucose-Capillary: 173 mg/dL — ABNORMAL HIGH (ref 70–99)
Glucose-Capillary: 146 mg/dL — ABNORMAL HIGH (ref 70–99)
Glucose-Capillary: 144 mg/dL — ABNORMAL HIGH (ref 70–99)
Glucose-Capillary: 238 mg/dL — ABNORMAL HIGH (ref 70–99)
Glucose-Capillary: 173 mg/dL — ABNORMAL HIGH (ref 70–99)
Glucose-Capillary: 207 mg/dL — ABNORMAL HIGH (ref 70–99)

## 2019-07-23 LAB — TROPONIN I (HIGH SENSITIVITY)
Troponin I (High Sensitivity): 47 ng/L — ABNORMAL HIGH (ref <18)
Troponin I (High Sensitivity): 45 ng/L — ABNORMAL HIGH (ref <18)

## 2019-07-23 LAB — HIV ANTIBODY (ROUTINE TESTING W REFLEX): HIV Screen 4th Generation wRfx: NONREACTIVE

## 2019-07-23 MED ORDER — LIVING WELL WITH DIABETES BOOK
Freq: Once | Status: DC
  Filled 2019-07-23: qty 1

## 2019-07-23 MED ORDER — HYDRALAZINE HCL 25 MG PO TABS: 25.0000 mg | ORAL_TABLET | ORAL | Status: DC | PRN

## 2019-07-23 MED ORDER — LABETALOL HCL 5 MG/ML IV SOLN: 10.0000 mg | INTRAVENOUS | Status: DC | PRN

## 2019-07-23 MED ORDER — LISINOPRIL 20 MG PO TABS
40.0000 mg | ORAL_TABLET | Freq: Every day | ORAL | Status: DC
  Administered 2019-07-23 – 2019-07-24 (×2): 40 mg via ORAL
  Filled 2019-07-23 (×2): qty 2

## 2019-07-23 NOTE — Assessment & Plan Note (Addendum)
-   uncontrolled on admission - continue current regimen and modify as needed - EKG reviewed: ST with LVH - obtain echo given long standing hx HTN

## 2019-07-23 NOTE — Progress Notes (Signed)
PROGRESS NOTE    Ryan Jenkins   YYF:[id number]  DOB: June 20, 1960  DOA: 07/22/2019     0  PCP: Ladell Pier, MD  CC: elevated BP  Hospital Course: [name] is a 59 yo AA male with PMH poorly controlled DMII, poorly controlled HTN, peripheral neuropathy, depression/anxiety who presented to the ER with elevated BP after being seen outpatient and referred to the ER.  BP on admission ranged 160s-190s/80s-110s. His A1c was 13.3% as well. He also complained of worsening vision over the past 3-4 weeks especially in his left eye, stating he could only see vague figures in his field of vision.  He was admitted for further workup regarding his visual deficits as well as hypertensive urgency.     Interval History:  Admitted overnight. He is sitting up in bed re-confirming the vision changes over the past few weeks. We also talked about need for medication compliance and better glucose control going forward.   Old records reviewed in assessment of this patient  ROS: Constitutional: negative for chills and fatigue, Respiratory: negative, Cardiovascular: negative for chest pain and Gastrointestinal: negative for abdominal pain  Assessment & Plan: Uncontrolled type 2 diabetes mellitus with peripheral neuropathy (HCC) - A1c 13.3% - patient endorses Lantus 46 units daily at home and Humalog; does not inject over scar tissue and rotates sites - continue on Lantus and SSI here and monitor CBG  Essential hypertension - uncontrolled on admission - continue current regimen and modify as needed - EKG reviewed: ST with LVH - obtain echo given long standing hx HTN  CKD (chronic kidney disease), stage III - likely due to long standing DMII, uncontrolled - avoid nephrotoxic agents as able   History of medication noncompliance - diabetes and HTN management stressed again bedside   Diabetic retinopathy of both eyes associated with type 2 diabetes mellitus ([place]) - ophtho consulted,  greatly appreciate assistance - patient has left eye vitreous hemorrhage, likely due to uncontrolled diabetes - follow up outpatient on 7/12 @ 8:30 am with Dr Iona Hansen at [facility]  Vitreous hemorrhage of left eye ([place]) - see diabetic retinopathy   Elevated troponin - see HTN - follow up echo - no CP currently; EKG reviewed, no obvious ischemia; if CP will repeat EKG - close outpatient follow up with cardiology to discuss if needs stress testing as well  Hypertensive urgency - concern for noncompliance at home - BP has responded to treatment - continue amlodipine, coreg, lisinopril - follow up echo and will adjust regimen as needed   Antimicrobials: n/a  DVT prophylaxis: Lovenox Code Status: Full Family Communication: none Disposition Plan:  . Patient came from: Home        . Barriers to d/c OR conditions which need to be met to effect a safe d/c: none . The current disposition plan is discharge to: Home   Objective: Vitals:   07/23/19 0926 07/23/19 0927 07/23/19 1012 07/23/19 1252  BP: (!) 155/85 (!) 155/85 (!) 155/85 (!) 149/83  Pulse:  81 81 82  Resp:  _0 Temp: 98.3 F (36.8 C) 98.3 F (36.8 C) 98.3 F (36.8 C) 99 F (37.2 C)  TempSrc: Oral Oral Oral Oral  SpO2:  100% 100% 100%  Weight:        Intake/Output Summary (Last 24 hours) at 07/23/2019 1600 Last data filed at 07/23/2019 0900 Gross per 24 hour  Intake 370 ml  Output 750 ml  Net -380 ml   [employer]  07/23/19 0120  Weight: 101.4 kg    Examination: General appearance: alert, cooperative and no distress Head: Normocephalic, without obvious abnormality Eyes: EOMI Lungs: scattered bibasilar crackles Heart: regular rate and rhythm and S1, S2 normal Abdomen: normal findings: bowel sounds normal and soft, non-tender Extremities: no edema Skin: mobility and turgor normal Neurologic: left visual field extremely decreased, right eye fields intact with "floaters" per patient;  no sensation in bilateral LE up to mid shin; strength 5/5 throughout    Consultants:   Ophtho  Procedures:   n/a  Data Reviewed: I have personally reviewed following labs and imaging studies Results for orders placed or performed during the hospital encounter of 07/22/19 (from the past 24 hour(s))  CBG monitoring, ED     Status: Abnormal   Collection Time: 07/22/19  5:08 PM  Result Value Ref Range   Glucose-Capillary 249 (H) 70 - 99 mg/dL  Basic metabolic panel     Status: Abnormal   Collection Time: 07/22/19  5:11 PM  Result Value Ref Range   Sodium 136 135 - 145 mmol/L   Potassium 3.0 (L) 3.5 - 5.1 mmol/L   Chloride 98 98 - 111 mmol/L   CO2 25 22 - 32 mmol/L   Glucose, Bld 309 (H) 70 - 99 mg/dL   BUN 12 6 - 20 mg/dL   Creatinine, Ser 2.04 (H) 0.61 - 1.24 mg/dL   Calcium 8.8 (L) 8.9 - 10.3 mg/dL   GFR calc non Af Amer 35 (L) >60 mL/min   GFR calc Af Amer 40 (L) >60 mL/min   Anion gap 13 5 - 15  CBC with Differential (PNL)     Status: Abnormal   Collection Time: 07/22/19  5:11 PM  Result Value Ref Range   WBC 6.8 4.0 - 10.5 K/uL   RBC 4.37 4.22 - 5.81 MIL/uL   Hemoglobin 13.3 13.0 - 17.0 g/dL   HCT 38.4 (L) 39 - 52 %   MCV 87.9 80.0 - 100.0 fL   MCH 30.4 26.0 - 34.0 pg   MCHC 34.6 30.0 - 36.0 g/dL   RDW 12.8 11.5 - 15.5 %   Platelets 287 150 - 400 K/uL   nRBC 0.0 0.0 - 0.2 %   Neutrophils Relative % 58 %   Neutro Abs 4.0 1.7 - 7.7 K/uL   Lymphocytes Relative 30 %   Lymphs Abs 2.0 0.7 - 4.0 K/uL   Monocytes Relative 8 %   Monocytes Absolute 0.5 0 - 1 K/uL   Eosinophils Relative 3 %   Eosinophils Absolute 0.2 0 - 0 K/uL   Basophils Relative 1 %   Basophils Absolute 0.1 0 - 0 K/uL   Immature Granulocytes 0 %   Abs Immature Granulocytes 0.01 0.00 - 0.07 K/uL  Blood gas, venous     Status: None   Collection Time: 07/22/19  5:11 PM  Result Value Ref Range   FIO2 NOT PROVIDED    pH, Ven 7.382 7.25 - 7.43   pCO2, Ven 45.9 44 - 60 mmHg   pO2, Ven 39.6 32 - 45 mmHg    Bicarbonate 26.7 20.0 - 28.0 mmol/L   Acid-Base Excess 1.7 0.0 - 2.0 mmol/L   O2 Saturation 70.4 %   Patient temperature NOT PROVIDED   Osmolality     Status: Abnormal   Collection Time: 07/22/19  5:11 PM  Result Value Ref Range   Osmolality 298 (H) 275 - 295 mOsm/kg  Brain natriuretic peptide     Status: Abnormal  Collection Time: 07/22/19  5:11 PM  Result Value Ref Range   B Natriuretic Peptide 230.6 (H) 0.0 - 100.0 pg/mL  Troponin I (High Sensitivity)     Status: Abnormal   Collection Time: 07/22/19  5:11 PM  Result Value Ref Range   Troponin I (High Sensitivity) 52 (H) <18 ng/L  SARS Coronavirus 2 by RT PCR (hospital order, performed in [place] hospital lab) Nasopharyngeal Nasopharyngeal Swab     Status: None   Collection Time: 07/22/19  5:11 PM   Specimen: Nasopharyngeal Swab  Result Value Ref Range   SARS Coronavirus 2 NEGATIVE NEGATIVE  Urinalysis, Routine w reflex microscopic     Status: Abnormal   Collection Time: 07/22/19  8:00 PM  Result Value Ref Range   Color, Urine STRAW (A) YELLOW   APPearance CLEAR CLEAR   Specific Gravity, Urine 1.013 1.005 - 1.030   pH 6.0 5.0 - 8.0   Glucose, UA >=500 (A) NEGATIVE mg/dL   Hgb urine dipstick SMALL (A) NEGATIVE   Bilirubin Urine NEGATIVE NEGATIVE   Ketones, ur NEGATIVE NEGATIVE mg/dL   Protein, ur >=300 (A) NEGATIVE mg/dL   Nitrite NEGATIVE NEGATIVE   Leukocytes,Ua NEGATIVE NEGATIVE   RBC / HPF 0-5 0 - 5 RBC/hpf   WBC, UA 0-5 0 - 5 WBC/hpf   Bacteria, UA NONE SEEN NONE SEEN  Troponin I (High Sensitivity)     Status: Abnormal   Collection Time: 07/22/19  9:02 PM  Result Value Ref Range   Troponin I (High Sensitivity) 55 (H) <18 ng/L  Glucose, capillary     Status: Abnormal   Collection Time: 07/23/19  1:14 AM  Result Value Ref Range   Glucose-Capillary 173 (H) 70 - 99 mg/dL  Hemoglobin A1c     Status: Abnormal   Collection Time: 07/23/19  2:20 AM  Result Value Ref Range   Hgb A1c MFr Bld 13.3 (H) 4.8 - 5.6 %    Mean Plasma Glucose 335.01 mg/dL  HIV Antibody (routine testing w rflx)     Status: None   Collection Time: 07/23/19  2:20 AM  Result Value Ref Range   HIV Screen 4th Generation wRfx Non Reactive Non Reactive  Basic metabolic panel     Status: Abnormal   Collection Time: 07/23/19  2:20 AM  Result Value Ref Range   Sodium 138 135 - 145 mmol/L   Potassium 3.2 (L) 3.5 - 5.1 mmol/L   Chloride 100 98 - 111 mmol/L   CO2 27 22 - 32 mmol/L   Glucose, Bld 189 (H) 70 - 99 mg/dL   BUN 12 6 - 20 mg/dL   Creatinine, Ser 1.82 (H) 0.61 - 1.24 mg/dL   Calcium 8.8 (L) 8.9 - 10.3 mg/dL   GFR calc non Af Amer 40 (L) >60 mL/min   GFR calc Af Amer 46 (L) >60 mL/min   Anion gap 11 5 - 15  Troponin I (High Sensitivity)     Status: Abnormal   Collection Time: 07/23/19  2:20 AM  Result Value Ref Range   Troponin I (High Sensitivity) 47 (H) <18 ng/L  Troponin I (High Sensitivity)     Status: Abnormal   Collection Time: 07/23/19  4:20 AM  Result Value Ref Range   Troponin I (High Sensitivity) 45 (H) <18 ng/L  Glucose, capillary     Status: Abnormal   Collection Time: 07/23/19  7:22 AM  Result Value Ref Range   Glucose-Capillary 146 (H) 70 - 99 mg/dL  Glucose, capillary  Status: Abnormal   Collection Time: 07/23/19 10:53 AM  Result Value Ref Range   Glucose-Capillary 144 (H) 70 - 99 mg/dL    Recent Results (from the past 240 hour(s))  SARS Coronavirus 2 by RT PCR (hospital order, performed in [facility] hospital lab) Nasopharyngeal Nasopharyngeal Swab     Status: None   Collection Time: 07/22/19  5:11 PM   Specimen: Nasopharyngeal Swab  Result Value Ref Range Status   SARS Coronavirus 2 NEGATIVE NEGATIVE Final    Comment: (NOTE) SARS-CoV-2 target nucleic acids are NOT DETECTED.  The SARS-CoV-2 RNA is generally detectable in upper and lower respiratory specimens during the acute phase of infection. The lowest concentration of SARS-CoV-2 viral copies this assay can detect is 250 copies / mL. A  negative result does not preclude SARS-CoV-2 infection and should not be used as the sole basis for treatment or other patient management decisions.  A negative result may occur with improper specimen collection / handling, submission of specimen other than nasopharyngeal swab, presence of viral mutation(s) within the areas targeted by this assay, and inadequate number of viral copies (<250 copies / mL). A negative result must be combined with clinical observations, patient history, and epidemiological information.  Fact Sheet for Patients:   StrictlyIdeas.no  Fact Sheet for Healthcare Providers: BankingDealers.co.za  This test is not yet approved or  cleared by the [place] FDA and has been authorized for detection and/or diagnosis of SARS-CoV-2 by FDA under an Emergency Use Authorization (EUA).  This EUA will remain in effect (meaning this test can be used) for the duration of the COVID-19 declaration under Section 564(b)(1) of the Act, 21 U.S.C. section 360bbb-3(b)(1), unless the authorization is terminated or revoked sooner.  Performed at [facility], [place] [address], [place], [place] [address]      Radiology Studies: CT Head Wo Contrast  Result Date: 07/22/2019 CLINICAL DATA:  Neuro deficit, acute, stroke suspected. Additional history provided: Unable to see out of left eye for 3 weeks. EXAM: CT HEAD WITHOUT CONTRAST TECHNIQUE: Contiguous axial images were obtained from the base of the skull through the vertex without intravenous contrast. COMPARISON:  No pertinent prior studies available for comparison. FINDINGS: Brain: Cerebral volume is normal for age. There is no acute intracranial hemorrhage. No demarcated cortical infarct is identified. No extra-axial fluid collection. No evidence of intracranial mass. No midline shift. Vascular: No hyperdense vessel.  Atherosclerotic calcifications. Skull: Normal. Negative  for fracture or focal lesion. Sinuses/Orbits: Visualized orbits show no acute finding. Mild paranasal sinus mucosal thickening, most notably within bilateral ethmoid air cells. No significant mastoid effusion. IMPRESSION: No CT evidence of acute intracranial abnormality. Mild paranasal sinus mucosal thickening, most notably ethmoidal. Electronically Signed   By: Kellie Simmering DO   On: 07/22/2019 18:37   DG Chest Portable 1 View  Result Date: 07/22/2019 CLINICAL DATA:  Elevated blood glucose levels. EXAM: PORTABLE CHEST 1 VIEW COMPARISON:  January 12, 2016 FINDINGS: The heart size and mediastinal contours are within normal limits. Both lungs are clear. The visualized skeletal structures are unremarkable. IMPRESSION: No active disease. Electronically Signed   By: Virgina Norfolk M.D.   On: 07/22/2019 17:45   CT Head Wo Contrast  Final Result    DG Chest Portable 1 View  Final Result    MR BRAIN WO CONTRAST    (Results Pending)     Scheduled Meds: . amLODipine  10 mg Oral Daily  . atorvastatin  40 mg Oral Daily  .  carvedilol  3.125 mg Oral BID WC  . enoxaparin (LOVENOX) injection  40 mg Subcutaneous Q24H  . escitalopram  20 mg Oral Daily  . gabapentin  300 mg Oral TID  . insulin aspart  0-15 Units Subcutaneous TID WC  . insulin aspart  0-5 Units Subcutaneous QHS  . insulin aspart  4 Units Subcutaneous TID WC  . insulin glargine  46 Units Subcutaneous Q2200  . lisinopril  40 mg Oral Daily   PRN Meds: acetaminophen **OR** acetaminophen, hydrALAZINE, labetalol, ondansetron **OR** ondansetron (ZOFRAN) IV, pantoprazole Continuous Infusions:    LOS: 0 days  Time spent: Greater than 50% of the 35 minute visit was spent in counseling/coordination of care for the patient as laid out in the A&P.   Dwyane Dee, MD [facility] 07/23/2019, 4:00 PM   Contact via secure chat.  To contact the attending provider between 7A-7P or the covering provider during after hours 7P-7A, please  log into the web site [email or web address] and access using universal [place] password for that web site. If you do not have the password, please call the hospital operator.

## 2019-07-23 NOTE — Assessment & Plan Note (Signed)
-   see HTN - follow up echo - no CP currently; EKG reviewed, no obvious ischemia; if CP will repeat EKG - close outpatient follow up with cardiology to discuss if needs stress testing as well

## 2019-07-23 NOTE — Assessment & Plan Note (Addendum)
-   ophtho consulted, greatly appreciate assistance - patient has left eye vitreous hemorrhage, likely due to uncontrolled diabetes - follow up outpatient on 7/12 @ 8:30 am with Dr Iona Hansen at [facility]

## 2019-07-23 NOTE — Progress Notes (Signed)
Inpatient Diabetes Program Recommendations  AACE/ADA: New Consensus Statement on Inpatient Glycemic Control (2015)  Target Ranges:  Prepandial:   less than 140 mg/dL      Peak postprandial:   less than 180 mg/dL (1-2 hours)      Critically ill patients:  140 - 180 mg/dL   Lab Results  Component Value Date   GLUCAP 144 (H) 07/23/2019   HGBA1C 13.3 (H) 07/23/2019    Review of Glycemic Control  Diabetes history: DM2 Outpatient Diabetes medications: Lantus 46 units QHS, Humalog 14-14-17 tidwc Current orders for Inpatient glycemic control: Lantus 46 units QD, Novolog 0-15 units tidwc and hs + 4 units tidwc  HgbA1C - 13.3% - uncontrolled  Inpatient Diabetes Program Recommendations:     Agree with orders.  Spoke with pt about HgbA1C of 13.3%, averaging 335 mg/dL each day. Pt states he has been drinking regular sodas and not eating regular meals. Follows with MD on a regular basis and she has recently increased both basal and bolus insulins. Discussed diet and decreasing CHO intake overall. Sent Living Well with Diabetes book for reference. Answered questions.  Thank you. Lorenda Peck, RD, LDN, CDE Inpatient Diabetes Coordinator [phone]

## 2019-07-23 NOTE — Assessment & Plan Note (Signed)
-   A1c 13.3% - patient endorses Lantus 46 units daily at home and Humalog; does not inject over scar tissue and rotates sites - continue on Lantus and SSI here and monitor CBG

## 2019-07-23 NOTE — Consult Note (Signed)
  Ophthalmology Consult  This is a 59 yo male with h/o of Diabetes and HTN who was admitted for uncontrolled HTN and elevated blood sugar.  Pt has a past ocular history of cataract surgery in both eyes over 5 years ago and seeing an optometrist for glasses about 2 years ago.  The Optometrist recommended he see a retina specialist at that time but that was not financially possible at that time.  Pt complains of floaters in the right eye and decreased vision in the left eye.  He can only see shapes in the left eye for the past month.  On examination pt was found to be 20/30 in the right eye and Count Fingers vision in the left eye.  PERRL and extraocular motility was intact.  Full ductions and versions.  IOP was 14 in the right and 13 in the left.  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  Pt had a vitreous hemorrhage in the left eye with no view of the retina  A/P 1. Diabetic retinopathy with vitreous hemorrhage in the left eye.  D/W retina and Dr Iona Hansen at [facility] will see patient on Monday at 8:30 am to evaluate.  Stressed need for blood sugar control and relation to ocular disease. 2. Hypertension: stressed need for blood pressure control and relation to ocular disease. 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  Thank you for allowing me to participate in the care of this patient.  Please feel free to contact me if you have any concerns.  Darleen Crocker, M.D. (office) [phone] (cell) [phone]

## 2019-07-23 NOTE — Assessment & Plan Note (Signed)
-   diabetes and HTN management stressed again bedside

## 2019-07-23 NOTE — Assessment & Plan Note (Signed)
-   see diabetic retinopathy

## 2019-07-23 NOTE — Hospital Course (Addendum)
Ryan Jenkins is a 59 yo AA male with PMH poorly controlled DMII, poorly controlled HTN, peripheral neuropathy, depression/anxiety who presented to the ER with elevated BP after being seen outpatient and referred to the ER.  BP on admission ranged 160s-190s/80s-110s. His A1c was 13.3% as well. He also complained of worsening vision over the past 3-4 weeks especially in his left eye, stating he could only see vague figures in his field of vision.  He was admitted for further workup regarding his visual deficits as well as hypertensive urgency.   His BP responded well to treatment. His lisinopril was increased to 40 mg daily and he was continued on his home regimen of amlodipine and coreg.  Glucose levels remained controlled on his home regimen and good control of HTN and DMII was strongly encouraged multiple times during hospitalization.  His indeterminate troponins on admission were considered due to hypertensive urgency.  He had no ischemic changes appreciated on EKG nor chest pain.  He underwent an echo which showed normal EF 55 to 60% with grade 1 diastolic dysfunction.  There was no hypokinesis no regional wall motion abnormalities appreciated.  Due to his vision abnormalities, he also underwent MRI brain which was negative for acute stroke.  Ophthalmology was also consulted and he was found to have a vitreous hemorrhage in his left eye and he was arranged for outpatient follow-up on 07/27/2019 with Dr. Iona Hansen.

## 2019-07-23 NOTE — Assessment & Plan Note (Signed)
-   likely due to long standing DMII, uncontrolled - avoid nephrotoxic agents as able

## 2019-07-23 NOTE — Progress Notes (Signed)
Echocardiogram 2D Echocardiogram has been performed.  Oneal Deputy Avanni Turnbaugh 07/23/2019, 2:58 PM

## 2019-07-23 NOTE — Assessment & Plan Note (Signed)
-   concern for noncompliance at home - BP has responded to treatment - continue amlodipine, coreg, lisinopril - follow up echo and will adjust regimen as needed

## 2019-07-24 DIAGNOSIS — I160 Hypertensive urgency: Secondary | ICD-10-CM | POA: Diagnosis not present

## 2019-07-24 DIAGNOSIS — E1142 Type 2 diabetes mellitus with diabetic polyneuropathy: Secondary | ICD-10-CM | POA: Diagnosis not present

## 2019-07-24 DIAGNOSIS — H4312 Vitreous hemorrhage, left eye: Secondary | ICD-10-CM | POA: Diagnosis not present

## 2019-07-24 DIAGNOSIS — E1165 Type 2 diabetes mellitus with hyperglycemia: Secondary | ICD-10-CM | POA: Diagnosis not present

## 2019-07-24 LAB — BASIC METABOLIC PANEL
Anion gap: 6 (ref 5–15)
BUN: 14 mg/dL (ref 6–20)
CO2: 28 mmol/L (ref 22–32)
Calcium: 8.3 mg/dL — ABNORMAL LOW (ref 8.9–10.3)
Chloride: 103 mmol/L (ref 98–111)
Creatinine, Ser: 1.81 mg/dL — ABNORMAL HIGH (ref 0.61–1.24)
GFR calc Af Amer: 46 mL/min — ABNORMAL LOW (ref >60)
GFR calc non Af Amer: 40 mL/min — ABNORMAL LOW (ref >60)
Glucose, Bld: 165 mg/dL — ABNORMAL HIGH (ref 70–99)
Potassium: 3.4 mmol/L — ABNORMAL LOW (ref 3.5–5.1)
Sodium: 137 mmol/L (ref 135–145)

## 2019-07-24 LAB — CBC WITH DIFFERENTIAL/PLATELET
Abs Immature Granulocytes: 0.01 10*3/uL (ref 0.00–0.07)
Basophils Absolute: 0 10*3/uL (ref 0.0–0.1)
Basophils Relative: 0 %
Eosinophils Absolute: 0.2 10*3/uL (ref 0.0–0.5)
Eosinophils Relative: 3 %
HCT: 33 % — ABNORMAL LOW (ref 39.0–52.0)
Hemoglobin: 11.2 g/dL — ABNORMAL LOW (ref 13.0–17.0)
Immature Granulocytes: 0 %
Lymphocytes Relative: 38 %
Lymphs Abs: 2.8 10*3/uL (ref 0.7–4.0)
MCH: 30.3 pg (ref 26.0–34.0)
MCHC: 33.9 g/dL (ref 30.0–36.0)
MCV: 89.2 fL (ref 80.0–100.0)
Monocytes Absolute: 0.5 10*3/uL (ref 0.1–1.0)
Monocytes Relative: 7 %
Neutro Abs: 3.8 10*3/uL (ref 1.7–7.7)
Neutrophils Relative %: 52 %
Platelets: 259 10*3/uL (ref 150–400)
RBC: 3.7 MIL/uL — ABNORMAL LOW (ref 4.22–5.81)
RDW: 13.2 % (ref 11.5–15.5)
WBC: 7.3 10*3/uL (ref 4.0–10.5)
nRBC: 0 % (ref 0.0–0.2)

## 2019-07-24 LAB — GLUCOSE, CAPILLARY
Glucose-Capillary: 159 mg/dL — ABNORMAL HIGH (ref 70–99)
Glucose-Capillary: 167 mg/dL — ABNORMAL HIGH (ref 70–99)
Glucose-Capillary: 92 mg/dL (ref 70–99)

## 2019-07-24 LAB — MAGNESIUM: Magnesium: 1.5 mg/dL — ABNORMAL LOW (ref 1.7–2.4)

## 2019-07-24 MED ORDER — MAGNESIUM OXIDE 400 (241.3 MG) MG PO TABS
800.0000 mg | ORAL_TABLET | Freq: Once | ORAL | Status: AC
  Administered 2019-07-24: 800 mg via ORAL
  Filled 2019-07-24: qty 2

## 2019-07-24 MED ORDER — GABAPENTIN 300 MG PO CAPS: 300.0000 mg | ORAL_CAPSULE | Freq: Three times a day (TID) | ORAL | 3 refills | Status: DC

## 2019-07-24 MED ORDER — ESCITALOPRAM OXALATE 20 MG PO TABS: 20.0000 mg | ORAL_TABLET | Freq: Every day | ORAL | 3 refills | Status: DC

## 2019-07-24 MED ORDER — ONDANSETRON HCL 4 MG PO TABS: 4.0000 mg | ORAL_TABLET | Freq: Three times a day (TID) | ORAL | 0 refills | Status: DC | PRN

## 2019-07-24 MED ORDER — LANTUS SOLOSTAR 100 UNIT/ML ~~LOC~~ SOPN: 46.0000 [IU] | PEN_INJECTOR | Freq: Every day | SUBCUTANEOUS | 3 refills | Status: DC

## 2019-07-24 MED ORDER — CYCLOBENZAPRINE HCL 5 MG PO TABS: 5.0000 mg | ORAL_TABLET | Freq: Two times a day (BID) | ORAL | 0 refills | Status: DC | PRN

## 2019-07-24 MED ORDER — AMLODIPINE BESYLATE 10 MG PO TABS: 10.0000 mg | ORAL_TABLET | Freq: Every day | ORAL | 6 refills | Status: DC

## 2019-07-24 MED ORDER — CYCLOBENZAPRINE HCL 5 MG PO TABS
5.0000 mg | ORAL_TABLET | Freq: Three times a day (TID) | ORAL | Status: DC | PRN
  Administered 2019-07-24: 5 mg via ORAL
  Filled 2019-07-24: qty 1

## 2019-07-24 MED ORDER — POTASSIUM CHLORIDE CRYS ER 20 MEQ PO TBCR
40.0000 meq | EXTENDED_RELEASE_TABLET | Freq: Once | ORAL | Status: AC
  Administered 2019-07-24: 40 meq via ORAL
  Filled 2019-07-24: qty 2

## 2019-07-24 MED ORDER — INSULIN LISPRO (1 UNIT DIAL) 100 UNIT/ML (KWIKPEN): PEN_INJECTOR | SUBCUTANEOUS | 11 refills | Status: DC

## 2019-07-24 MED ORDER — LISINOPRIL 40 MG PO TABS: 40.0000 mg | ORAL_TABLET | Freq: Every day | ORAL | 3 refills | Status: DC

## 2019-07-24 MED ORDER — CARVEDILOL 3.125 MG PO TABS: 3.1250 mg | ORAL_TABLET | Freq: Two times a day (BID) | ORAL | 3 refills | Status: DC

## 2019-07-24 MED ORDER — ATORVASTATIN CALCIUM 40 MG PO TABS: 40.0000 mg | ORAL_TABLET | Freq: Every day | ORAL | 6 refills | Status: DC

## 2019-07-24 MED ORDER — PANTOPRAZOLE SODIUM 40 MG PO TBEC: 40.0000 mg | DELAYED_RELEASE_TABLET | Freq: Every day | ORAL | 1 refills | Status: DC

## 2019-07-24 MED FILL — HUMALOG 100 UNITS/ML KWIKPE: 100 | 33 days supply | Qty: 15 | Fill #0

## 2019-07-24 MED FILL — GABAPENTIN 300 MG CAPSULE: 300 | 30 days supply | Qty: 90 | Fill #0

## 2019-07-24 MED FILL — ONDANSETRON HCL 4 MG TABLET: 4 | 6 days supply | Qty: 20 | Fill #0

## 2019-07-24 MED FILL — ESCITALOPRAM 20 MG TABLET: 20 | 90 days supply | Qty: 90 | Fill #0

## 2019-07-24 MED FILL — LISINOPRIL 40 MG TABLET: 40 | 90 days supply | Qty: 90 | Fill #0

## 2019-07-24 MED FILL — CYCLOBENZAPRINE 5 MG TABLET: 5 | 7 days supply | Qty: 15 | Fill #0

## 2019-07-24 MED FILL — LANTUS SOLOSTAR 100 UNITS/M: 100 | 32 days supply | Qty: 15 | Fill #0

## 2019-07-24 NOTE — Discharge Summary (Addendum)
Physician Discharge Summary  Ryan Jenkins PQA:[id number] DOB: 12-22-1960 DOA: 07/22/2019  PCP: Ladell Pier, MD  Admit date: 07/22/2019 Discharge date: 07/24/2019  Admitted From: home Disposition:  home Admitting physician: Dr. Alcario Drought Discharging physician: Dwyane Dee, MD  Recommendations for Outpatient Follow-up:  1. Follow up with Dr. Iona Hansen 2. Continue control of DMII and HTN   Patient discharged to Home in Discharge Condition: stable CODE STATUS: Full Diet recommendation:  Diet Orders (From admission, onward)    Start     Ordered   07/24/19 0000  Diet Carb Modified        07/24/19 1142   07/22/19 2313  Diet Carb Modified Fluid consistency: Thin; Room service appropriate? Yes  Diet effective now       Question Answer Comment  Diet-HS Snack? Nothing   Calorie Level Medium 1600-2000   Fluid consistency: Thin   Room service appropriate? Yes      07/22/19 2313          Hospital Course: Ryan Jenkins is a 59 yo AA male with PMH poorly controlled DMII, poorly controlled HTN, peripheral neuropathy, depression/anxiety who presented to the ER with elevated BP after being seen outpatient and referred to the ER.  BP on admission ranged 160s-190s/80s-110s. His A1c was 13.3% as well. He also complained of worsening vision over the past 3-4 weeks especially in his left eye, stating he could only see vague figures in his field of vision.  He was admitted for further workup regarding his visual deficits as well as hypertensive urgency.   His BP responded well to treatment. His lisinopril was increased to 40 mg daily and he was continued on his home regimen of amlodipine and coreg.  Glucose levels remained controlled on his home regimen and good control of HTN and DMII was strongly encouraged multiple times during hospitalization.  His indeterminate troponins on admission were considered due to hypertensive urgency.  He had no ischemic changes appreciated on EKG nor chest  pain.  He underwent an echo which showed normal EF 55 to 60% with grade 1 diastolic dysfunction.  There was no hypokinesis no regional wall motion abnormalities appreciated.  Due to his vision abnormalities, he also underwent MRI brain which was negative for acute stroke.  Ophthalmology was also consulted and he was found to have a vitreous hemorrhage in his left eye and he was arranged for outpatient follow-up on 07/27/2019 with Dr. Iona Hansen.    Uncontrolled type 2 diabetes mellitus with peripheral neuropathy (HCC) - A1c 13.3% - patient endorses Lantus 46 units daily at home and Humalog; does not inject over scar tissue and rotates sites - continue on Lantus and SSI here and monitor CBG  Essential hypertension - uncontrolled on admission - continue current regimen and modify as needed - EKG reviewed: ST with LVH - obtain echo given long standing hx HTN  CKD (chronic kidney disease), stage III - likely due to long standing DMII, uncontrolled - avoid nephrotoxic agents as able   History of medication noncompliance - diabetes and HTN management stressed again bedside   Diabetic retinopathy of both eyes associated with type 2 diabetes mellitus ([place]) - ophtho consulted, greatly appreciate assistance - patient has left eye vitreous hemorrhage, likely due to uncontrolled diabetes - follow up outpatient on 7/12 @ 8:30 am with Dr Iona Hansen at [facility]  Vitreous hemorrhage of left eye ([place]) - see diabetic retinopathy   Elevated troponin - see HTN - follow up echo - no CP currently;  EKG reviewed, no obvious ischemia; if CP will repeat EKG - close outpatient follow up with cardiology to discuss if needs stress testing as well  Hypertensive urgency - concern for noncompliance at home - BP has responded to treatment - continue amlodipine, coreg, lisinopril - follow up echo and will adjust regimen as needed    The patient's chronic medical conditions were treated  accordingly per the patient's home medication regimen except as noted.  On day of discharge, patient was felt deemed stable for discharge. Patient/family member advised to call PCP or come back to ER if needed.   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 ([place]) 01/03/2018   Uncontrolled type 2 diabetes mellitus with peripheral neuropathy ([place]) 02/05/2017   Essential hypertension 02/05/2017    Resolved Hospital Problems   Diagnosis Date Noted Date Resolved   Hypertensive urgency 07/23/2019 07/24/2019    Discharge Instructions    Diet Carb Modified   Complete by: As directed    Increase activity slowly   Complete by: As directed      Allergies as of 07/24/2019      Reactions   Claritin [loratadine] Swelling   Joint swelling   Hydrochlorothiazide    Dizziness   Lyrica [pregabalin]    depression   Metformin And Related    GI      Medication List    TAKE these medications   amLODipine 10 MG tablet Commonly known as: NORVASC Take 1 tablet (10 mg total) by mouth daily.   atorvastatin 40 MG tablet Commonly known as: LIPITOR Take 1 tablet (40 mg total) by mouth daily.   carvedilol 3.125 MG tablet Commonly known as: COREG Take 1 tablet (3.125 mg total) by mouth 2 (two) times daily with a meal.   cyclobenzaprine 5 MG tablet Commonly known as: FLEXERIL TAKE 1 TABLET (5 MG TOTAL) BY MOUTH 2 (TWO) TIMES DAILY AS NEEDED FOR MUSCLE SPASMS (MEDICATION CAN CAUSE DROWSINESS).   escitalopram 20 MG tablet Commonly known as: LEXAPRO Take 20 mg by mouth daily.   gabapentin 300 MG capsule Commonly known as: NEURONTIN TAKE 2  CAPSULES BY MOUTH in evening and 1 cap Q a.m What changed:   how much to take  how to take this  when to take this  additional instructions   glucose blood test strip Use as instructed   insulin lispro 100 UNIT/ML KwikPen Commonly known as: HumaLOG KwikPen 14 units subcu before breakfast and lunch, 17 units with dinner.   Lantus SoloStar 100 UNIT/ML Solostar Pen Generic drug: insulin glargine Inject 46 Units into the skin daily at 10 pm.   lisinopril 40 MG tablet Commonly known as: ZESTRIL Take 1 tablet (40 mg total) by mouth daily. Start taking on: July 25, 2019 What changed:   medication strength  how much to take   ondansetron 4 MG tablet Commonly known as: ZOFRAN TAKE 1 TABLET (4 MG TOTAL) BY MOUTH EVERY 8 (EIGHT) HOURS AS NEEDED FOR NAUSEA OR VOMITING.   pantoprazole 40 MG tablet Commonly known as: PROTONIX Take 1 tablet (40 mg total) by mouth daily. What changed:   when to take this  reasons to take this  Pen Needles 31G X 8 MM Misc Use as directed   True Metrix Meter w/Device Kit Use as directed   TRUEplus Lancets 28G Misc Use as directed   vitamin B-12 1000 MCG tablet Commonly known as: CYANOCOBALAMIN Take 1 tablet (1,000 mcg total) by mouth daily.       Follow-up Information    Desma Maxim, MD Follow up on 07/27/2019.   Specialty: Ophthalmology Why: 8:30 am Contact information: [address] [place] [address] [phone]              Allergies  Allergen Reactions   Claritin [Loratadine] Swelling    Joint swelling    Hydrochlorothiazide     Dizziness   Lyrica [Pregabalin]     depression   Metformin And Related     GI    Future appointments: 7/12 with Dr. Iona Hansen  Consultations: Ophtho   Procedures/Studies: CT Head Wo Contrast  Result Date: 07/22/2019 CLINICAL DATA:  Neuro deficit, acute, stroke suspected. Additional history provided: Unable to see out of left eye for 3 weeks. EXAM:  CT HEAD WITHOUT CONTRAST TECHNIQUE: Contiguous axial images were obtained from the base of the skull through the vertex without intravenous contrast. COMPARISON:  No pertinent prior studies available for comparison. FINDINGS: Brain: Cerebral volume is normal for age. There is no acute intracranial hemorrhage. No demarcated cortical infarct is identified. No extra-axial fluid collection. No evidence of intracranial mass. No midline shift. Vascular: No hyperdense vessel.  Atherosclerotic calcifications. Skull: Normal. Negative for fracture or focal lesion. Sinuses/Orbits: Visualized orbits show no acute finding. Mild paranasal sinus mucosal thickening, most notably within bilateral ethmoid air cells. No significant mastoid effusion. IMPRESSION: No CT evidence of acute intracranial abnormality. Mild paranasal sinus mucosal thickening, most notably ethmoidal. Electronically Signed   By: Kellie Simmering DO   On: 07/22/2019 18:37   MR BRAIN WO CONTRAST  Result Date: 07/23/2019 CLINICAL DATA:  Left vision loss EXAM: MRI HEAD WITHOUT CONTRAST TECHNIQUE: Multiplanar, multiecho pulse sequences of the brain and surrounding structures were obtained without intravenous contrast. COMPARISON:  None. FINDINGS: Motion artifact is present. Brain: There is no acute infarction or intracranial hemorrhage. There is no intracranial mass, mass effect, or edema. There is no hydrocephalus or extra-axial fluid collection. Ventricles and sulci are within normal limits in size and configuration. Few small foci of T2 hyperintensity in the supratentorial white matter are nonspecific but may reflect minor chronic microvascular ischemic changes. Vascular: Major vessel flow voids at the skull base are preserved. Skull and upper cervical spine: Normal marrow signal is preserved. Sinuses/Orbits: Mild mucosal thickening. Bilateral lens replacements. Orbits otherwise unremarkable on this nondedicated study. Other: Sella is unremarkable.  Mastoid air  cells are clear. IMPRESSION: No evidence of recent infarction, hemorrhage, or mass. Electronically Signed   By: Macy Mis M.D.   On: 07/23/2019 19:00   DG Chest Portable 1 View  Result Date: 07/22/2019 CLINICAL DATA:  Elevated blood glucose levels. EXAM: PORTABLE CHEST 1 VIEW COMPARISON:  January 12, 2016 FINDINGS: The heart size and mediastinal contours are within normal limits. Both lungs are clear. The visualized skeletal structures are unremarkable. IMPRESSION: No active disease. Electronically Signed   By: Virgina Norfolk M.D.   On: 07/22/2019 17:45   ECHOCARDIOGRAM COMPLETE  Result Date: 07/23/2019    ECHOCARDIOGRAM REPORT   Patient Name:   JERMICHAEL BELMARES Date of Exam: 07/23/2019 Medical Rec #:  [id number]         Height:  74.0 in Accession #:    [id number]        Weight:       223.5 lb Date of Birth:  07-13-1960         BSA:          2.279 m Patient Age:    30 years          BP:           149/83 mmHg Patient Gender: M                 HR:           82 bpm. Exam Location:  Inpatient Procedure: 2D Echo, 3D Echo, Color Doppler and Cardiac Doppler Indications:    Elevated Troponins  History:        Patient has no prior history of Echocardiogram examinations.                 Risk Factors:Hypertension and Diabetes.  Sonographer:    Raquel Sarna Senior RDCS Referring Phys: [place]  1. Left ventricular ejection fraction, by estimation, is 55 to 60%. The left ventricle has normal function. The left ventricle has no regional wall motion abnormalities. There is moderate left ventricular hypertrophy. Left ventricular diastolic parameters are consistent with Grade I diastolic dysfunction (impaired relaxation).  2. Right ventricular systolic function is normal. The right ventricular size is normal. Tricuspid regurgitation signal is inadequate for assessing PA pressure.  3. The mitral valve is normal in structure. No evidence of mitral valve regurgitation. No evidence of mitral stenosis.   4. The aortic valve is tricuspid. Aortic valve regurgitation is not visualized. No aortic stenosis is present.  5. The inferior vena cava is normal in size with greater than 50% respiratory variability, suggesting right atrial pressure of 3 mmHg. FINDINGS  Left Ventricle: Left ventricular ejection fraction, by estimation, is 55 to 60%. The left ventricle has normal function. The left ventricle has no regional wall motion abnormalities. The left ventricular internal cavity size was normal in size. There is  moderate left ventricular hypertrophy. Left ventricular diastolic parameters are consistent with Grade I diastolic dysfunction (impaired relaxation). Right Ventricle: The right ventricular size is normal. No increase in right ventricular wall thickness. Right ventricular systolic function is normal. Tricuspid regurgitation signal is inadequate for assessing PA pressure. Left Atrium: Left atrial size was normal in size. Right Atrium: Right atrial size was normal in size. Pericardium: There is no evidence of pericardial effusion. Mitral Valve: The mitral valve is normal in structure. No evidence of mitral valve regurgitation. No evidence of mitral valve stenosis. Tricuspid Valve: The tricuspid valve is normal in structure. Tricuspid valve regurgitation is not demonstrated. Aortic Valve: The aortic valve is tricuspid. Aortic valve regurgitation is not visualized. No aortic stenosis is present. Pulmonic Valve: The pulmonic valve was normal in structure. Pulmonic valve regurgitation is not visualized. Aorta: The aortic root is normal in size and structure. Venous: The inferior vena cava is normal in size with greater than 50% respiratory variability, suggesting right atrial pressure of 3 mmHg. IAS/Shunts: No atrial level shunt detected by color flow Doppler.  LEFT VENTRICLE PLAX 2D LVIDd:         3.90 cm  Diastology LVIDs:         2.90 cm  LV e' lateral:   4.46 cm/s LV PW:         1.10 cm  LV E/e' lateral: 15.1 LV IVS:  1.70 cm  LV e' medial:    4.35 cm/s LVOT diam:     2.20 cm  LV E/e' medial:  15.5 LV SV:         72 LV SV Index:   32 LVOT Area:     3.80 cm  RIGHT VENTRICLE RV S prime:     8.49 cm/s TAPSE (M-mode): 1.9 cm LEFT ATRIUM             Index       RIGHT ATRIUM          Index LA diam:        4.00 cm 1.76 cm/m  RA Area:     8.41 cm LA Vol (A2C):   41.1 ml 18.04 ml/m RA Volume:   12.50 ml 5.49 ml/m LA Vol (A4C):   61.2 ml 26.86 ml/m LA Biplane Vol: 50.1 ml 21.99 ml/m  AORTIC VALVE LVOT Vmax:   80.30 cm/s LVOT Vmean:  61.900 cm/s LVOT VTI:    0.189 m  AORTA Ao Root diam: 3.20 cm Ao Asc diam:  3.60 cm MITRAL VALVE MV Area (PHT): 2.22 cm    SHUNTS MV Decel Time: 342 msec    Systemic VTI:  0.19 m MV E velocity: 67.30 cm/s  Systemic Diam: 2.20 cm MV A velocity: 95.10 cm/s MV E/A ratio:  0.71 Loralie Champagne MD Electronically signed by Loralie Champagne MD Signature Date/Time: 07/23/2019/4:26:26 PM    Final     Discharge Exam: BP 140/76 (BP Location: Left Arm)    Pulse 79    Temp 97.9 F (36.6 C) (Oral)    Resp 18    Ht _0  (1.88 m)    Wt 101.4 kg    SpO2 100%    BMI 28.70 kg/m  General appearance: alert, cooperative and no distress Head: Normocephalic, without obvious abnormality Eyes: EOMI Lungs: scattered bibasilar crackles Heart: regular rate and rhythm and S1, S2 normal Abdomen: normal findings: bowel sounds normal and soft, non-tender Extremities: no edema Skin: mobility and turgor normal Neurologic: left visual field extremely decreased, right eye fields intact with "floaters" per patient; no sensation in bilateral LE up to mid shin; strength 5/5 throughout    The results of significant diagnostics from this hospitalization (including imaging, microbiology, ancillary and laboratory) are listed below for reference.     Microbiology: Recent Results (from the past 240 hour(s))  SARS Coronavirus 2 by RT PCR (hospital order, performed in [facility] hospital lab) Nasopharyngeal Nasopharyngeal Swab      Status: None   Collection Time: 07/22/19  5:11 PM   Specimen: Nasopharyngeal Swab  Result Value Ref Range Status   SARS Coronavirus 2 NEGATIVE NEGATIVE Final    Comment: (NOTE) SARS-CoV-2 target nucleic acids are NOT DETECTED.  The SARS-CoV-2 RNA is generally detectable in upper and lower respiratory specimens during the acute phase of infection. The lowest concentration of SARS-CoV-2 viral copies this assay can detect is 250 copies / mL. A negative result does not preclude SARS-CoV-2 infection and should not be used as the sole basis for treatment or other patient management decisions.  A negative result may occur with improper specimen collection / handling, submission of specimen other than nasopharyngeal swab, presence of viral mutation(s) within the areas targeted by this assay, and inadequate number of viral copies (<250 copies / mL). A negative result must be combined with clinical observations, patient history, and epidemiological information.  Fact Sheet for Patients:   StrictlyIdeas.no  Fact Sheet for Healthcare  Providers: BankingDealers.co.za  This test is not yet approved or  cleared by the [place] and has been authorized for detection and/or diagnosis of SARS-CoV-2 by FDA under an Emergency Use Authorization (EUA).  This EUA will remain in effect (meaning this test can be used) for the duration of the COVID-19 declaration under Section 564(b)(1) of the Act, 21 U.S.C. section 360bbb-3(b)(1), unless the authorization is terminated or revoked sooner.  Performed at [facility], [place] [address] [place], [place] [address]      Labs: BNP (last 3 results) Recent Labs    07/22/19 1711  BNP 416.6*   Basic Metabolic Panel: Recent Labs  Lab 07/22/19 1711 07/23/19 0220 07/24/19 0503  NA 136 138 137  K 3.0* 3.2* 3.4*  CL 98 100 103  CO2 _0 GLUCOSE 309* 189* 165*  BUN _1 CREATININE 2.04* 1.82* 1.81*  CALCIUM 8.8* 8.8* 8.3*  MG  --   --  1.5*   Liver Function Tests: No results for input(s): AST, ALT, ALKPHOS, BILITOT, PROT, ALBUMIN in the last 168 hours. No results for input(s): LIPASE, AMYLASE in the last 168 hours. No results for input(s): AMMONIA in the last 168 hours. CBC: Recent Labs  Lab 07/22/19 1711 07/24/19 0503  WBC 6.8 7.3  NEUTROABS 4.0 3.8  HGB 13.3 11.2*  HCT 38.4* 33.0*  MCV 87.9 89.2  PLT 287 259   Cardiac Enzymes: No results for input(s): CKTOTAL, CKMB, CKMBINDEX, TROPONINI in the last 168 hours. BNP: Invalid input(s): POCBNP CBG: Recent Labs  Lab 07/23/19 1913 07/23/19 2207 07/24/19 0414 07/24/19 0716 07/24/19 1055  GLUCAP 238* 207* 159* 167* 92   D-Dimer No results for input(s): DDIMER in the last 72 hours. Hgb A1c Recent Labs    07/22/19 1538 07/23/19 0220  HGBA1C 13.2* 13.3*   Lipid Profile No results for input(s): CHOL, HDL, LDLCALC, TRIG, CHOLHDL, LDLDIRECT in the last 72 hours. Thyroid function studies No results for input(s): TSH, T4TOTAL, T3FREE, THYROIDAB in the last 72 hours.  Invalid input(s): FREET3 Anemia work up No results for input(s): VITAMINB12, FOLATE, FERRITIN, TIBC, IRON, RETICCTPCT in the last 72 hours. Urinalysis    Component Value Date/Time   COLORURINE STRAW (A) 07/22/2019 2000   APPEARANCEUR CLEAR 07/22/2019 2000   LABSPEC 1.013 07/22/2019 2000   PHURINE 6.0 07/22/2019 2000   GLUCOSEU >=500 (A) 07/22/2019 2000   HGBUR SMALL (A) 07/22/2019 2000   BILIRUBINUR NEGATIVE 07/22/2019 2000   KETONESUR NEGATIVE 07/22/2019 2000   PROTEINUR >=300 (A) 07/22/2019 2000   NITRITE NEGATIVE 07/22/2019 2000   LEUKOCYTESUR NEGATIVE 07/22/2019 2000   Sepsis Labs Invalid input(s): PROCALCITONIN,  WBC,  LACTICIDVEN Microbiology Recent Results (from the past 240 hour(s))  SARS Coronavirus 2 by RT PCR (hospital order, performed in [place] hospital lab) Nasopharyngeal Nasopharyngeal Swab      Status: None   Collection Time: 07/22/19  5:11 PM   Specimen: Nasopharyngeal Swab  Result Value Ref Range Status   SARS Coronavirus 2 NEGATIVE NEGATIVE Final    Comment: (NOTE) SARS-CoV-2 target nucleic acids are NOT DETECTED.  The SARS-CoV-2 RNA is generally detectable in upper and lower respiratory specimens during the acute phase of infection. The lowest concentration of SARS-CoV-2 viral copies this assay can detect is 250 copies / mL. A negative result does not preclude SARS-CoV-2 infection and should not be used as the sole basis for treatment or other patient management decisions.  A negative result may occur with  improper specimen collection / handling, submission of specimen other than nasopharyngeal swab, presence of viral mutation(s) within the areas targeted by this assay, and inadequate number of viral copies (<250 copies / mL). A negative result must be combined with clinical observations, patient history, and epidemiological information.  Fact Sheet for Patients:   StrictlyIdeas.no  Fact Sheet for Healthcare Providers: BankingDealers.co.za  This test is not yet approved or  cleared by the [place] FDA and has been authorized for detection and/or diagnosis of SARS-CoV-2 by FDA under an Emergency Use Authorization (EUA).  This EUA will remain in effect (meaning this test can be used) for the duration of the COVID-19 declaration under Section 564(b)(1) of the Act, 21 U.S.C. section 360bbb-3(b)(1), unless the authorization is terminated or revoked sooner.  Performed at [facility], [place] [address] [place], [place] [address]      Time coordinating discharge: Over 72 minutes    Dwyane Dee, MD  [facility] 07/24/2019, 11:43 AM Pager: Secure chat  If 7PM-7AM, please contact night-coverage [email or web address] Password [email or web address]

## 2019-07-24 NOTE — Plan of Care (Signed)
  Problem: Education: Goal: Knowledge of General Education information will improve Description: Including pain rating scale, medication(s)/side effects and non-pharmacologic comfort measures Outcome: Completed/Met   Problem: Health Behavior/Discharge Planning: Goal: Ability to manage health-related needs will improve Outcome: Completed/Met   Problem: Clinical Measurements: Goal: Ability to maintain clinical measurements within normal limits will improve Outcome: Completed/Met Goal: Will remain free from infection Outcome: Completed/Met Goal: Diagnostic test results will improve Outcome: Completed/Met Goal: Respiratory complications will improve Outcome: Completed/Met Goal: Cardiovascular complication will be avoided Outcome: Completed/Met   Problem: Activity: Goal: Risk for activity intolerance will decrease Outcome: Completed/Met   Problem: Activity: Goal: Risk for activity intolerance will decrease Outcome: Completed/Met   Problem: Nutrition: Goal: Adequate nutrition will be maintained Outcome: Completed/Met   Problem: Elimination: Goal: Will not experience complications related to bowel motility Outcome: Completed/Met Goal: Will not experience complications related to urinary retention Outcome: Completed/Met   Problem: Pain Managment: Goal: General experience of comfort will improve Outcome: Completed/Met   Problem: Safety: Goal: Ability to remain free from injury will improve Outcome: Completed/Met   Problem: Skin Integrity: Goal: Risk for impaired skin integrity will decrease Outcome: Completed/Met

## 2019-07-24 NOTE — Progress Notes (Signed)
Pt discharged home today per Dr. Sabino Gasser. Pt's IV sites D/C'd and WDL. Pt's VSS. Pt provided with home medication list, discharge instructions and prescriptions. Verbalized understanding. Pt left floor via WC in stable condition accompanied by NT.

## 2019-07-27 ENCOUNTER — Telehealth: Payer: Self-pay

## 2019-07-27 MED FILL — ATORVASTATIN CALCIUM 40 MG: 40 | 90 days supply | Qty: 90 | Fill #0

## 2019-07-27 MED FILL — PANTOPRAZOLE SOD DR 40 MG T: 40 | 30 days supply | Qty: 30 | Fill #0

## 2019-07-27 MED FILL — AMLODIPINE BESYLATE 10 MG T: 10 | 90 days supply | Qty: 90 | Fill #0

## 2019-07-27 MED FILL — CARVEDILOL 3.125 MG TABLET: 3.125 | 90 days supply | Qty: 180 | Fill #0

## 2019-07-27 NOTE — Telephone Encounter (Addendum)
Transition Care Management Follow-up Telephone Call  Date of discharge and from where: 07/24/2019, [facility]   How have you been since you were released from the hospital? He said he is doing okay.  Has lower back problems due to an old injury, not a new problem.  He was at the ophthalmologist at the time of this call. He said that he would come to the clinic this afternoon to meet with this CM  Any questions or concerns? He was assigned a [employer] plan that is not in network with [employer]. He will discuss further with this CM. He will need to either change insurance plans or change PCP who is in network..     Items Reviewed:  Did the pt receive and understand the discharge instructions provided? yes  Medications obtained and verified? he said that he has his medications and did not have any questions  Any new allergies since your discharge? none reported   Do you have support at home?  no, he said that he lives with an elderly friend  No home health or DME ordered.   He has a glucometer.   He said he was recently approved for disability.   He would like a home BP monitor  Functional Questionnaire: (I = Independent and D = Dependent) ADLs: independent  Follow up appointments reviewed:   PCP Hospital f/u appt confirmed?Dr Wynetta Emery 08/07/2019 @ [facility] f/u appt confirmed? none scheduled at this time  If their condition worsens, is the pt aware to call PCP or go to the Emergency Dept.?  yes  Was the patient provided with contact information for the PCP's office or ED? he has the clinic phone number  Was to pt encouraged to call back with questions or concerns?  Yes.  This CM met with the patient when he was in the clinic this afternoon.  Provided him with the contact information for [place] [employer] to call and change is insurance plan.  # [id number]. Informed him of the 3 plans that [facility] is in  Network with.

## 2019-07-28 ENCOUNTER — Other Ambulatory Visit: Payer: Self-pay | Admitting: Pharmacist

## 2019-07-28 MED ORDER — PEN NEEDLES 31G X 8 MM MISC: 6 refills | Status: DC

## 2019-07-28 MED FILL — TRUEPLUS PEN NDL 31GX5/16: 31G X 8 MM | 25 days supply | Qty: 100 | Fill #0

## 2019-08-07 ENCOUNTER — Other Ambulatory Visit: Payer: Self-pay

## 2019-08-07 ENCOUNTER — Ambulatory Visit: Payer: Medicaid Other | Attending: Internal Medicine | Admitting: Internal Medicine

## 2019-08-07 ENCOUNTER — Encounter: Payer: Self-pay | Admitting: Internal Medicine

## 2019-08-07 VITALS — BP 181/93 | HR 90 | Resp 16 | Wt 228.8 lb

## 2019-08-07 DIAGNOSIS — E1121 Type 2 diabetes mellitus with diabetic nephropathy: Secondary | ICD-10-CM

## 2019-08-07 DIAGNOSIS — E1142 Type 2 diabetes mellitus with diabetic polyneuropathy: Secondary | ICD-10-CM

## 2019-08-07 DIAGNOSIS — Z09 Encounter for follow-up examination after completed treatment for conditions other than malignant neoplasm: Secondary | ICD-10-CM

## 2019-08-07 DIAGNOSIS — H4312 Vitreous hemorrhage, left eye: Secondary | ICD-10-CM

## 2019-08-07 DIAGNOSIS — N521 Erectile dysfunction due to diseases classified elsewhere: Secondary | ICD-10-CM | POA: Insufficient documentation

## 2019-08-07 DIAGNOSIS — L509 Urticaria, unspecified: Secondary | ICD-10-CM

## 2019-08-07 DIAGNOSIS — IMO0002 Reserved for concepts with insufficient information to code with codable children: Secondary | ICD-10-CM

## 2019-08-07 DIAGNOSIS — I10 Essential (primary) hypertension: Secondary | ICD-10-CM

## 2019-08-07 DIAGNOSIS — E1169 Type 2 diabetes mellitus with other specified complication: Secondary | ICD-10-CM

## 2019-08-07 DIAGNOSIS — E1165 Type 2 diabetes mellitus with hyperglycemia: Secondary | ICD-10-CM

## 2019-08-07 LAB — GLUCOSE, POCT (MANUAL RESULT ENTRY): POC Glucose: 232 mg/dl — AB (ref 70–99)

## 2019-08-07 MED ORDER — FUROSEMIDE 20 MG PO TABS: 20.0000 mg | ORAL_TABLET | Freq: Every day | ORAL | 3 refills | Status: DC

## 2019-08-07 MED ORDER — INSULIN LISPRO (1 UNIT DIAL) 100 UNIT/ML (KWIKPEN): PEN_INJECTOR | SUBCUTANEOUS | 11 refills | Status: DC

## 2019-08-07 MED ORDER — CETIRIZINE HCL 10 MG PO TABS: 5.0000 mg | ORAL_TABLET | Freq: Every day | ORAL | 1 refills | Status: DC

## 2019-08-07 MED FILL — CETIRIZINE HCL 10 MG TABS: 10 | 30 days supply | Qty: 15 | Fill #0

## 2019-08-07 MED FILL — FUROSEMIDE 20 MG TABS: 20 | 30 days supply | Qty: 30 | Fill #0

## 2019-08-07 NOTE — Patient Instructions (Addendum)
Change Humalog insulin to 14 units with breakfast and 17 units with lunch and dinner.  We have added a low-dose of a fluid pill called furosemide to help decrease the swelling in your legs.   I have referred you to the nephrologist, urologist and an allergist.

## 2019-08-07 NOTE — Progress Notes (Signed)
Patient ID: Ryan Jenkins, male    DOB: [birth year]  MRN: [id number]  CC: Transition of care Date of hospitalization: 7/7-09/2019 Date of call from case manager: 07/27/2019  Subjective: Ryan Jenkins is a 59 y.o. male who presents for transition of care His concerns today include:  Pt with hx of DM withretinopathy,neuropathy (in feet and hands), DM nephropathy with CHK 3,HTN, HL,anxiety,Vit B12 def chronic LBP, gastric ulcers.  Patient hospitalized with hypertensive urgency after being sent from the office.  Blood pressure on admission reported as 160s to 190s over 80s to 110.  Patient complained of loss of vision in the left eye x3 to 4 weeks prior.  His A1c was 13.3.  Patient was kept on same blood pressure regiment of amlodipine, carvedilol and lisinopril but lisinopril was increased to 40 mg daily.  He had indeterminate troponins on admission with no ischemic changes on EKG and no chest pains.  Echo revealed EF of 55 to 60% with grade 1 diastolic dysfunction.  There was no wall motion abnormalities.  MRI of the brain was negative for acute stroke.  He was seen by ophthalmology and was diagnosed with vitreous hemorrhage in his left eye.  Outpatient follow-up was arranged.  Today: Since last visit with me he was approved for Medicaid.  Diabetic retinopathy/trace hemorrhage: He is seeing the ophthalmologist Dr. Iona Hansen in follow-up.  Plan is for surgery on 08/12/2019.  He has noted a dull ache above the left eye medial corner and just below the eyebrow.  It comes and goes.  HTN: He reports compliance with his blood pressure medications and have taken them already for today.  He is limiting salt in the foods.  Endorses lower extremity edema for the past several months.  No PND orthopnea.  Some shortness of breath at times.  No chest pains.  DM: A1c in the hospital was 13.3.  Reports currently taking Lantus 46 units and NovoLog 14 units with breakfast and lunch and 17 units with dinner.   He does not have a log book with him but reports his blood sugars before breakfast are ranging 97-110 and before dinner 210-250.  He feels he is doing better with his eating habits.  CKD/diabetic nephropathy: Prior to him getting Medicaid, he was seen once by nephrology clinic at [facility].  Urine protein was 3.2 g.  SPEP and FLC unremarkable.  He is not on any NSAIDs.  He is requesting referral to urology for erectile dysfunction.  We had referred him last year but he was not seen because he did not have the orange card/cone discount.  We have tried him with Viagra and Cialis both of which did not work for him.  Other complaint today is of intermittent hives which he gets during the summer months for the past several years.  He denies any food allergies.  He thinks it must be an environmental thing. Patient Active Problem List   Diagnosis Date Noted  . Vitreous hemorrhage of left eye ([place]) 07/22/2019  . Elevated troponin 07/22/2019  . Vision loss of left eye 07/22/2019  . History of medication noncompliance 04/02/2019  . Gastric ulcer without hemorrhage or perforation 12/25/2018  . CKD (chronic kidney disease), stage III 12/25/2018  . Gastroesophageal reflux disease without esophagitis 09/04/2018  . 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 Intermittent diarrhea 09/27/2017  . Gastroparesis 09/27/2017  . Macroalbuminuric diabetic nephropathy ([place]) 06/25/2017  . History of falling  06/25/2017  . Hyperlipidemia 03/21/2017  . Vitamin B 12 deficiency 03/21/2017  . Uncontrolled type 2 diabetes mellitus with peripheral neuropathy ([place]) 02/05/2017  . Essential hypertension 02/05/2017  . Depression 02/05/2017  . Unintended weight loss 02/05/2017  . Gait disturbance 02/05/2017  . 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 Diabetic neuropathy, type II diabetes mellitus ([place]) 05/11/2014  . Metatarsal deformity 05/11/2014      Current Outpatient Medications on File Prior to Visit  Medication Sig Dispense Refill  . amLODipine (NORVASC) 10 MG tablet Take 1 tablet (10 mg total) by mouth daily. 30 tablet 6  . atorvastatin (LIPITOR) 40 MG tablet Take 1 tablet (40 mg total) by mouth daily. 30 tablet 6  . Blood Glucose Monitoring Suppl (TRUE METRIX METER) w/Device KIT Use as directed 1 kit 0  . carvedilol (COREG) 3.125 MG tablet Take 1 tablet (3.125 mg total) by mouth 2 (two) times daily with a meal. 60 tablet 3  . cyclobenzaprine (FLEXERIL) 5 MG tablet Take 1 tablet (5 mg total) by mouth 2 (two) times daily as needed for muscle spasms (medication can cause drowsiness). 15 tablet 0  . escitalopram (LEXAPRO) 20 MG tablet Take 1 tablet (20 mg total) by mouth daily. 30 tablet 3  . gabapentin (NEURONTIN) 300 MG capsule Take 1 capsule (300 mg total) by mouth 3 (three) times daily. 90 capsule 3  . glucose blood test strip Use as instructed 100 each 12  . insulin glargine (LANTUS SOLOSTAR) 100 UNIT/ML Solostar Pen Inject 46 Units into the skin daily at 10 pm. 5 pen 3  . insulin lispro (HUMALOG KWIKPEN) 100 UNIT/ML KwikPen 14 units subcu before breakfast and lunch, 17 units with dinner. 15 mL 11  . Insulin Pen Needle (PEN NEEDLES) 31G X 8 MM MISC Use as directed 100 each 6  . lisinopril (ZESTRIL) 40 MG tablet Take 1 tablet (40 mg total) by mouth daily. 30 tablet 3  . ondansetron (ZOFRAN) 4 MG tablet Take 1 tablet (4 mg total) by mouth every 8 (eight) hours as needed for nausea or vomiting. 20 tablet 0  . pantoprazole (PROTONIX) 40 MG tablet Take 1 tablet (40 mg total) by mouth daily. 30 tablet 1  . TRUEPLUS LANCETS 28G MISC Use as directed 100 each 1  . vitamin B-12 (CYANOCOBALAMIN) 1000 MCG tablet Take 1 tablet (1,000 mcg total) by mouth daily. 100 tablet 3   No current facility-administered medications on file prior to visit.    Allergies  Allergen Reactions  . Claritin [Loratadine] Swelling    Joint swelling   .  Hydrochlorothiazide     Dizziness  . Lyrica [Pregabalin]     depression  . Metformin And Related     GI    Social History   Socioeconomic History  . Marital status: Divorced    Spouse name: Not on file  . Number of children: 1  . Years of education: 87  . Highest education level: Not on file  Occupational History  . 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 Smoking status: Never Smoker  . Smokeless tobacco: Never Used  Vaping Use  . Vaping Use: Never used  Substance and Sexual Activity  . Alcohol use: No    Alcohol/week: 0.0 standard drinks  . Drug use: No  . Sexual activity: Yes  Other Topics Concern  . Not on file  Social History Narrative  . Not on file   Social Determinants of Health  Financial Resource Strain:   . Difficulty of Paying Living Expenses:   Food Insecurity:   . Worried About Charity fundraiser in the Last Year:   . Arboriculturist in the Last Year:   Transportation Needs:   . Film/video editor (Medical):   [name] Lack of Transportation (Non-Medical):   Physical Activity:   . Days of Exercise per Week:   . Minutes of Exercise per Session:   Stress:   . Feeling of Stress :   Social Connections:   . Frequency of Communication with Friends and Family:   . Frequency of Social Gatherings with Friends and Family:   . Attends Religious Services:   . Active Member of Clubs or Organizations:   . Attends Archivist Meetings:   [name] Marital Status:   Intimate Partner Violence:   . Fear of Current or Ex-Partner:   . Emotionally Abused:   [name] Physically Abused:   . Sexually Abused:     Family History  Problem Relation Age of Onset  . Renal cancer Mother   . Hypertension Mother   . Pancreatic cancer Mother   . Hypertension Sister   . Stroke Sister   . Leukemia Maternal Uncle   . Sickle cell trait Maternal Aunt   . Colon cancer Neg Hx   . Esophageal cancer Neg Hx   . Rectal cancer Neg Hx   . Stomach  cancer Neg Hx     Past Surgical History:  Procedure Laterality Date  . CATARACT EXTRACTION Bilateral 2017    ROS: Review of Systems Negative except as stated above  PHYSICAL EXAM: BP (!) 181/93   Pulse 90   Resp 16   Wt (!) 228 lb 12.8 oz (103.8 kg)   SpO2 99%   BMI 29.38 kg/m   . Physical Exam  General appearance - alert, well appearing, and in no distress Mental status - normal mood, behavior, speech, dress, motor activity, and thought processes Eyes - pupils equal and reactive, extraocular eye movements intact Mouth - mucous membranes moist, pharynx normal without lesions Neck - supple, no significant adenopathy Chest - clear to auscultation, no wheezes, rales or rhonchi, symmetric air entry Heart - normal rate, regular rhythm, normal S1, S2, no murmurs, rubs, clicks or gallops Extremities -trace to 1+ bilateral lower extremity edema.  1+ ankle edema MSK: Patient ambulates with a cane  CMP Latest Ref Rng & Units 07/24/2019 07/23/2019 07/22/2019  Glucose 70 - 99 mg/dL 165(H) 189(H) 309(H)  BUN 6 - 20 mg/dL 14 12 12   Creatinine 0.61 - 1.24 mg/dL 1.81(H) 1.82(H) 2.04(H)  Sodium 135 - 145 mmol/L 137 138 136  Potassium 3.5 - 5.1 mmol/L 3.4(L) 3.2(L) 3.0(L)  Chloride 98 - 111 mmol/L 103 100 98  CO2 22 - 32 mmol/L 28 27 25   Calcium 8.9 - 10.3 mg/dL 8.3(L) 8.8(L) 8.8(L)  Total Protein 6.0 - 8.5 g/dL - - -  Total Bilirubin 0.0 - 1.2 mg/dL - - -  Alkaline Phos 39 - 117 IU/L - - -  AST 0 - 40 IU/L - - -  ALT 0 - 44 IU/L - - -   Lipid Panel     Component Value Date/Time   CHOL 172 02/25/2018 1657   TRIG 158 (H) 02/25/2018 1657   HDL 39 (L) 02/25/2018 1657   CHOLHDL 4.4 02/25/2018 1657   LDLCALC 101 (H) 02/25/2018 1657    CBC    Component Value Date/Time   WBC 7.3 07/24/2019  0503   RBC 3.70 (L) 07/24/2019 0503   HGB 11.2 (L) 07/24/2019 0503   HGB 13.3 08/07/2017 1424   HCT 33.0 (L) 07/24/2019 0503   HCT 40.6 08/07/2017 1424   PLT 259 07/24/2019 0503   PLT 335  08/07/2017 1424   MCV 89.2 07/24/2019 0503   MCV 92 08/07/2017 1424   MCH 30.3 07/24/2019 0503   MCHC 33.9 07/24/2019 0503   RDW 13.2 07/24/2019 0503   RDW 14.5 08/07/2017 1424   LYMPHSABS 2.8 07/24/2019 0503   LYMPHSABS 2.6 08/07/2017 1424   MONOABS 0.5 07/24/2019 0503   EOSABS 0.2 07/24/2019 0503   EOSABS 0.2 08/07/2017 1424   BASOSABS 0.0 07/24/2019 0503   BASOSABS 0.0 08/07/2017 1424    ASSESSMENT AND PLAN: 1. Hospital discharge follow-up -Stressed the importance of taking his medications as prescribed.  2. Uncontrolled type 2 diabetes mellitus with peripheral neuropathy (HCC) Continue Lantus insulin 46 units daily.  Change Humalog insulin to 14 units with breakfast and 17 units with lunch and dinner.  Continue to monitor blood sugars. - POCT glucose (manual entry) - insulin lispro (HUMALOG KWIKPEN) 100 UNIT/ML KwikPen; 14 units subcu before breakfast and 17 units with lunch and  dinner.  Dispense: 15 mL; Refill: 11  3. Diabetic nephropathy associated with type 2 diabetes mellitus [facility]) Now that he has Medicaid, we will refer him to [facility] - Basic Metabolic Panel - Ambulatory referral to Nephrology  4. Essential hypertension Not at goal.  Will add low-dose of furosemide - furosemide (LASIX) 20 MG tablet; Take 1 tablet (20 mg total) by mouth daily.  Dispense: 30 tablet; Refill: 3  5. Vitreous hemorrhage of left eye [facility]) Patient has establish care with ophthalmology Dr. Iona Hansen and surgery is planned for next week  6. Erectile dysfunction associated with type 2 diabetes mellitus ([place]) - Ambulatory referral to Urology  7. Hives Of questionable etiology.  We will have him take Zyrtec daily and refer him to allergy. - cetirizine (ZYRTEC) 10 MG tablet; Take 0.5 tablets (5 mg total) by mouth daily.  Dispense: 30 tablet; Refill: 1 - Ambulatory referral to Allergy    Patient was given the opportunity to ask questions.  Patient verbalized understanding of  the plan and was able to repeat key elements of the plan.   Orders Placed This Encounter  Procedures  . POCT glucose (manual entry)     Requested Prescriptions    No prescriptions requested or ordered in this encounter    No follow-ups on file.  Karle Plumber, MD, FACP

## 2019-08-08 LAB — BASIC METABOLIC PANEL
BUN/Creatinine Ratio: 8 — ABNORMAL LOW (ref 9–20)
BUN: 14 mg/dL (ref 6–24)
CO2: 26 mmol/L (ref 20–29)
Calcium: 8.9 mg/dL (ref 8.7–10.2)
Chloride: 103 mmol/L (ref 96–106)
Creatinine, Ser: 1.85 mg/dL — ABNORMAL HIGH (ref 0.76–1.27)
GFR calc Af Amer: 45 mL/min/{1.73_m2} — ABNORMAL LOW (ref >59)
GFR calc non Af Amer: 39 mL/min/{1.73_m2} — ABNORMAL LOW (ref >59)
Glucose: 164 mg/dL — ABNORMAL HIGH (ref 65–99)
Potassium: 3.9 mmol/L (ref 3.5–5.2)
Sodium: 140 mmol/L (ref 134–144)

## 2019-08-09 NOTE — Progress Notes (Signed)
Kidney function is not 100% but stable.  Continue current blood pressure medications.  I have submitted the referral for him to see the nephrologist at [facility] here in [place].

## 2019-08-11 ENCOUNTER — Telehealth: Payer: Self-pay

## 2019-08-11 NOTE — Telephone Encounter (Signed)
Contacted pt to go over lab results pt didn't answer lvm asking pt to give a call back at his earliest convenience  

## 2019-09-18 ENCOUNTER — Ambulatory Visit (HOSPITAL_COMMUNITY)
Admission: EM | Admit: 2019-09-18 | Discharge: 2019-09-18 | Disposition: A | Discharge status: Home / Self Care | Payer: Medicaid Other | Attending: Internal Medicine | Admitting: Internal Medicine

## 2019-09-18 ENCOUNTER — Other Ambulatory Visit: Payer: Self-pay

## 2019-09-18 ENCOUNTER — Encounter (HOSPITAL_COMMUNITY): Payer: Self-pay | Admitting: *Deleted

## 2019-09-18 DIAGNOSIS — K047 Periapical abscess without sinus: Secondary | ICD-10-CM

## 2019-09-18 MED ORDER — AMOXICILLIN-POT CLAVULANATE 875-125 MG PO TABS: 1.0000 | ORAL_TABLET | Freq: Two times a day (BID) | ORAL | 0 refills | Status: AC

## 2019-09-18 MED ORDER — HYDROCODONE-ACETAMINOPHEN 5-325 MG PO TABS: 1.0000 | ORAL_TABLET | Freq: Four times a day (QID) | ORAL | 0 refills | Status: DC | PRN

## 2019-09-18 NOTE — Discharge Instructions (Signed)
Please use dental resource to contact offices to seek permenant treatment/relief.   Begin augmentin twice daily x 1 week for infection/abscess  For pain please take 600mg -800mg  of Ibuprofen every 8 hours, take with 1000 mg of Tylenol Extra strength every 8 hours. These are safe to take together. Please take with food.   I have also provided 2 days worth of stronger pain medication. This should only be used for severe pain. Do not drive or operate machinery while taking this medication.   Please return if you start to experience significant swelling of your face, experiencing fever.

## 2019-09-18 NOTE — ED Triage Notes (Signed)
Patient reports right upper dental pain x 3 days.

## 2019-09-18 NOTE — ED Provider Notes (Signed)
Ryan Jenkins    CSN: [id number] Arrival date & time: 09/18/19  1545      History   Chief Complaint Chief Complaint  Patient presents with  . Dental Pain    HPI Ryan Jenkins is a 59 y.o. male history of GERD, CKD stage III, DM type II, presenting today for evaluation of dental pain.  Patient reports over the past 3 days has had increased pain and swelling to his right upper jaw.  Pain has been radiating up towards his right eye.  Does report he has a bad tooth in this area.  Denies any fevers.  Denies difficulty swallowing or neck stiffness.  Using ibuprofen without relief of symptoms.  Recently had retinal hemorrhage and recently had surgery to remove this.  Denies any recent worsening of pain or changes in vision.  HPI  Past Medical History:  Diagnosis Date  . Abdominal pain 11/25/2018  . Acid reflux 11/25/2018  . Anxiety   . Bilateral swelling of feet and ankles 11/25/2018   and legs  . Change in bowel habits 11/25/2018  . Constipation 11/25/2018  . Depression   . Diabetes mellitus without complication ([place])   . Diabetic neuropathy ([place])   . Diarrhea 11/25/2018  . Fatigue 11/25/2018  . GERD (gastroesophageal reflux disease)   . Hypertension   . Loss of appetite 11/25/2018  . Muscle spasm 11/25/2018  . Neuromuscular disorder ([facility])    neuropathy  . Retinopathy due to secondary diabetes ([place])   . Sleep disturbances 11/25/2018  . Vision changes 11/25/2018  . Vitamin B12 deficiency     Patient Active Problem List   Diagnosis Date Noted  . Erectile dysfunction associated with type 2 diabetes mellitus ([place]) 08/07/2019  . Hives 08/07/2019  . Vitreous hemorrhage of left eye ([place]) 07/22/2019  . Elevated troponin 07/22/2019  . Vision loss of left eye 07/22/2019  . History of medication noncompliance 04/02/2019  . Gastric ulcer without hemorrhage or perforation 12/25/2018  . CKD (chronic kidney disease), stage III 12/25/2018  . Gastroesophageal reflux  disease without esophagitis 09/04/2018  . Diabetic retinopathy of both eyes associated with type 2 diabetes mellitus ([place]) 01/03/2018  . Intermittent diarrhea 09/27/2017  . Gastroparesis 09/27/2017  . Macroalbuminuric diabetic nephropathy ([place]) 06/25/2017  . History of falling 06/25/2017  . Hyperlipidemia 03/21/2017  . Vitamin B 12 deficiency 03/21/2017  . Uncontrolled type 2 diabetes mellitus with peripheral neuropathy ([place]) 02/05/2017  . Essential hypertension 02/05/2017  . Depression 02/05/2017  . Unintended weight loss 02/05/2017  . Gait disturbance 02/05/2017  . 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 Diabetic neuropathy, type II diabetes mellitus ([place]) 05/11/2014  . 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 CATARACT EXTRACTION Bilateral 2017       Home Medications    Prior to Admission medications   Medication Sig Start Date End Date Taking? Authorizing Provider  amLODipine (NORVASC) 10 MG tablet Take 1 tablet (10 mg total) by mouth daily. 07/24/19   Dwyane Dee, MD  amoxicillin-clavulanate (AUGMENTIN) 875-125 MG tablet Take 1 tablet by mouth every 12 (twelve) hours for 7 days. 09/18/19 09/25/19  Jnya Brossard C, PA-C  atorvastatin (LIPITOR) 40 MG tablet Take 1 tablet (40 mg total) by mouth daily. 07/24/19   Dwyane Dee, MD  Blood Glucose Monitoring Suppl (TRUE METRIX METER) w/Device KIT Use as directed 02/05/17   Ladell Pier, MD  carvedilol (COREG) 3.125 MG tablet Take 1 tablet (3.125 mg  total) by mouth 2 (two) times daily with a meal. 07/24/19   Dwyane Dee, MD  cetirizine (ZYRTEC) 10 MG tablet Take 0.5 tablets (5 mg total) by mouth daily. 08/07/19   Ladell Pier, MD  cyclobenzaprine (FLEXERIL) 5 MG tablet Take 1 tablet (5 mg total) by mouth 2 (two) times daily as needed for muscle spasms (medication can cause drowsiness). 07/24/19   Dwyane Dee, MD  escitalopram (LEXAPRO) 20 MG tablet Take 1 tablet (20 mg  total) by mouth daily. 07/24/19   Dwyane Dee, MD  furosemide (LASIX) 20 MG tablet Take 1 tablet (20 mg total) by mouth daily. 08/07/19   Ladell Pier, MD  gabapentin (NEURONTIN) 300 MG capsule Take 1 capsule (300 mg total) by mouth 3 (three) times daily. 07/24/19   Dwyane Dee, MD  glucose blood test strip Use as instructed 07/28/18   Ladell Pier, MD  HYDROcodone-acetaminophen (NORCO/VICODIN) 5-325 MG tablet Take 1-2 tablets by mouth every 6 (six) hours as needed for severe pain. 09/18/19   Jakevious Hollister C, PA-C  insulin glargine (LANTUS SOLOSTAR) 100 UNIT/ML Solostar Pen Inject 46 Units into the skin daily at 10 pm. 07/24/19   Dwyane Dee, MD  insulin lispro (HUMALOG KWIKPEN) 100 UNIT/ML KwikPen 14 units subcu before breakfast and 17 units with lunch and  dinner. 08/07/19   Ladell Pier, MD  Insulin Pen Needle (PEN NEEDLES) 31G X 8 MM MISC Use as directed 07/28/19   Ladell Pier, MD  lisinopril (ZESTRIL) 40 MG tablet Take 1 tablet (40 mg total) by mouth daily. 07/25/19   Dwyane Dee, MD  ondansetron (ZOFRAN) 4 MG tablet Take 1 tablet (4 mg total) by mouth every 8 (eight) hours as needed for nausea or vomiting. 07/24/19   Dwyane Dee, MD  pantoprazole (PROTONIX) 40 MG tablet Take 1 tablet (40 mg total) by mouth daily. 07/24/19   Dwyane Dee, MD  TRUEPLUS LANCETS 28G MISC Use as directed 02/05/17   Ladell Pier, MD  vitamin B-12 (CYANOCOBALAMIN) 1000 MCG tablet Take 1 tablet (1,000 mcg total) by mouth daily. 06/03/18   Ladell Pier, MD    Family History Family History  Problem Relation Age of Onset  . Renal cancer Mother   . Hypertension Mother   . Pancreatic cancer Mother   . Hypertension Sister   . Stroke Sister   . Leukemia Maternal Uncle   . Sickle cell trait Maternal Aunt   . Colon cancer Neg Hx   . Esophageal cancer Neg Hx   . Rectal cancer Neg Hx   . Stomach cancer Neg Hx     Social History Social History   Tobacco Use  . Smoking status:  Never Smoker  . Smokeless tobacco: Never Used  Vaping Use  . Vaping Use: Never used  Substance Use Topics  . Alcohol use: No    Alcohol/week: 0.0 standard drinks  . Drug use: No     Allergies   Claritin [loratadine], Hydrochlorothiazide, Lyrica [pregabalin], and Metformin and related   Review of Systems Review of Systems  Constitutional: Negative for activity change, appetite change, chills, fatigue and fever.  HENT: Positive for dental problem. Negative for congestion, ear pain, rhinorrhea, sinus pressure, sore throat and trouble swallowing.   Eyes: Negative for discharge and redness.  Respiratory: Negative for cough, chest tightness and shortness of breath.   Cardiovascular: Negative for chest pain.  Gastrointestinal: Negative for abdominal pain, diarrhea, nausea and vomiting.  Musculoskeletal: Negative for myalgias.  Skin: Negative  for rash.  Neurological: Positive for headaches. Negative for dizziness and light-headedness.     Physical Exam Triage Vital Signs ED Triage Vitals  Enc Vitals Group     BP      Pulse      Resp      Temp      Temp src      SpO2      Weight      Height      Head Circumference      Peak Flow      Pain Score      Pain Loc      Pain Edu?      Excl. in [place]?    No data found.  Updated Vital Signs There were no vitals taken for this visit.  Visual Acuity Right Eye Distance:   Left Eye Distance:   Bilateral Distance:    Right Eye Near:   Left Eye Near:    Bilateral Near:     Physical Exam Vitals and nursing note reviewed.  Constitutional:      Appearance: He is well-developed.     Comments: No acute distress  HENT:     Head: Normocephalic and atraumatic.     Ears:     Comments: Bilateral ears without tenderness to palpation of external auricle, tragus and mastoid, EAC's without erythema or swelling, TM's with good bony landmarks and cone of light. Non erythematous.     Nose: Nose normal.     Mouth/Throat:     Comments:  Gingival swelling erythema and tenderness noted of the above tooth of right upper jaw, mild tenderness to palpation along maxillary area, no soft palate swelling, posterior pharynx patent Eyes:     Extraocular Movements: Extraocular movements intact.     Conjunctiva/sclera: Conjunctivae normal.     Pupils: Pupils are equal, round, and reactive to light.  Cardiovascular:     Rate and Rhythm: Normal rate.  Pulmonary:     Effort: Pulmonary effort is normal. No respiratory distress.  Abdominal:     General: There is no distension.  Musculoskeletal:        General: Normal range of motion.     Cervical back: Neck supple.  Skin:    General: Skin is warm and dry.  Neurological:     Mental Status: He is alert and oriented to person, place, and time.      UC Treatments / Results  Labs (all labs ordered are listed, but only abnormal results are displayed) Labs Reviewed - No data to display  EKG   Radiology No results found.  Procedures Procedures (including critical care time)  Medications Ordered in UC Medications - No data to display  Initial Impression / Assessment and Plan / UC Course  I have reviewed the triage vital signs and the nursing notes.  Pertinent labs & imaging results that were available during my care of the patient were reviewed by me and considered in my medical decision making (see chart for details).     Exam suggestive of dental abscess.  Initiated on Augmentin, continue Tylenol for mild to moderate pain, hydrocodone for severe pain.  Warm compresses with close monitoring.  No sign of deep space infection at this time, continue to monitor,Discussed strict return precautions. Patient verbalized understanding and is agreeable with plan.  Final Clinical Impressions(s) / UC Diagnoses   Final diagnoses:  Dental abscess     Discharge Instructions     Please use dental resource to contact  offices to seek permenant treatment/relief.   Begin augmentin  twice daily x 1 week for infection/abscess  For pain please take 624m-800mg of Ibuprofen every 8 hours, take with 1000 mg of Tylenol Extra strength every 8 hours. These are safe to take together. Please take with food.   I have also provided 2 days worth of stronger pain medication. This should only be used for severe pain. Do not drive or operate machinery while taking this medication.   Please return if you start to experience significant swelling of your face, experiencing fever.    ED Prescriptions    Medication Sig Dispense Auth. Provider   amoxicillin-clavulanate (AUGMENTIN) 875-125 MG tablet Take 1 tablet by mouth every 12 (twelve) hours for 7 days. 14 tablet Gilford Lardizabal C, PA-C   HYDROcodone-acetaminophen (NORCO/VICODIN) 5-325 MG tablet Take 1-2 tablets by mouth every 6 (six) hours as needed for severe pain. 8 tablet Jari Carollo, HOaklynC, PA-C     I have reviewed the PDMP during this encounter.   WJanith Lima PVermont09/03/21 1653

## 2019-09-22 ENCOUNTER — Ambulatory Visit: Payer: Self-pay | Admitting: Allergy & Immunology

## 2019-10-07 MED FILL — LANTUS SOLOSTAR 100 UNITS/M: 100 | 32 days supply | Qty: 15 | Fill #1

## 2019-10-07 MED FILL — TRUEPLUS PEN NDL 31GX5/16: 31G X 8 MM | 25 days supply | Qty: 100 | Fill #0

## 2019-10-07 MED FILL — HUMALOG 100 UNITS/ML KWIKPE: 100 | 33 days supply | Qty: 15 | Fill #1

## 2019-10-14 MED FILL — ONDANSETRON HCL 4 MG TABLET: 4 | 6 days supply | Qty: 20 | Fill #1

## 2019-10-14 MED FILL — LANTUS SOLOSTAR 100 UNITS/M: 100 | 32 days supply | Qty: 15 | Fill #1

## 2019-10-14 MED FILL — PANTOPRAZOLE SOD DR 40 MG T: 40 | 30 days supply | Qty: 30 | Fill #1

## 2019-10-14 MED FILL — HUMALOG 100 UNITS/ML KWIKPE: 100 | 30 days supply | Qty: 15 | Fill #0

## 2019-10-14 MED FILL — LISINOPRIL 40 MG TABLET: 40 | 30 days supply | Qty: 30 | Fill #1

## 2019-10-14 MED FILL — TRUEPLUS PEN NDL 31GX5/16: 31G X 8 MM | 25 days supply | Qty: 100 | Fill #0

## 2019-11-06 HISTORY — PX: CARDIAC CATHETERIZATION: SHX172

## 2020-07-06 ENCOUNTER — Other Ambulatory Visit: Payer: Self-pay

## 2020-07-06 ENCOUNTER — Emergency Department (HOSPITAL_COMMUNITY): Payer: Medicaid Other

## 2020-07-06 ENCOUNTER — Observation Stay (HOSPITAL_COMMUNITY)
Admission: EM | Admit: 2020-07-06 | Discharge: 2020-07-09 | Disposition: A | Discharge status: Home / Self Care | Payer: Medicaid Other | Attending: Family Medicine | Admitting: Family Medicine

## 2020-07-06 DIAGNOSIS — Z79899 Other long term (current) drug therapy: Secondary | ICD-10-CM | POA: Diagnosis not present

## 2020-07-06 DIAGNOSIS — Z91148 Patient's other noncompliance with medication regimen for other reason: Secondary | ICD-10-CM

## 2020-07-06 DIAGNOSIS — R7989 Other specified abnormal findings of blood chemistry: Secondary | ICD-10-CM | POA: Diagnosis not present

## 2020-07-06 DIAGNOSIS — R079 Chest pain, unspecified: Secondary | ICD-10-CM | POA: Diagnosis not present

## 2020-07-06 DIAGNOSIS — Z20822 Contact with and (suspected) exposure to covid-19: Secondary | ICD-10-CM | POA: Diagnosis not present

## 2020-07-06 DIAGNOSIS — N184 Chronic kidney disease, stage 4 (severe): Secondary | ICD-10-CM | POA: Insufficient documentation

## 2020-07-06 DIAGNOSIS — E1140 Type 2 diabetes mellitus with diabetic neuropathy, unspecified: Secondary | ICD-10-CM | POA: Diagnosis present

## 2020-07-06 DIAGNOSIS — I10 Essential (primary) hypertension: Secondary | ICD-10-CM | POA: Diagnosis present

## 2020-07-06 DIAGNOSIS — R609 Edema, unspecified: Secondary | ICD-10-CM

## 2020-07-06 DIAGNOSIS — E1122 Type 2 diabetes mellitus with diabetic chronic kidney disease: Secondary | ICD-10-CM | POA: Diagnosis not present

## 2020-07-06 DIAGNOSIS — M2540 Effusion, unspecified joint: Secondary | ICD-10-CM

## 2020-07-06 DIAGNOSIS — Z7984 Long term (current) use of oral hypoglycemic drugs: Secondary | ICD-10-CM | POA: Insufficient documentation

## 2020-07-06 DIAGNOSIS — R52 Pain, unspecified: Secondary | ICD-10-CM

## 2020-07-06 DIAGNOSIS — Z794 Long term (current) use of insulin: Secondary | ICD-10-CM | POA: Insufficient documentation

## 2020-07-06 DIAGNOSIS — R600 Localized edema: Secondary | ICD-10-CM

## 2020-07-06 DIAGNOSIS — Z9114 Patient's other noncompliance with medication regimen: Secondary | ICD-10-CM

## 2020-07-06 DIAGNOSIS — I5033 Acute on chronic diastolic (congestive) heart failure: Secondary | ICD-10-CM | POA: Diagnosis present

## 2020-07-06 DIAGNOSIS — R109 Unspecified abdominal pain: Secondary | ICD-10-CM | POA: Diagnosis not present

## 2020-07-06 DIAGNOSIS — M25569 Pain in unspecified knee: Secondary | ICD-10-CM

## 2020-07-06 DIAGNOSIS — I130 Hypertensive heart and chronic kidney disease with heart failure and stage 1 through stage 4 chronic kidney disease, or unspecified chronic kidney disease: Secondary | ICD-10-CM | POA: Insufficient documentation

## 2020-07-06 LAB — CBC
HCT: 31.9 % — ABNORMAL LOW (ref 39.0–52.0)
Hemoglobin: 10.4 g/dL — ABNORMAL LOW (ref 13.0–17.0)
MCH: 29.5 pg (ref 26.0–34.0)
MCHC: 32.6 g/dL (ref 30.0–36.0)
MCV: 90.4 fL (ref 80.0–100.0)
Platelets: 285 10*3/uL (ref 150–400)
RBC: 3.53 MIL/uL — ABNORMAL LOW (ref 4.22–5.81)
RDW: 14.6 % (ref 11.5–15.5)
WBC: 8 10*3/uL (ref 4.0–10.5)
nRBC: 0 % (ref 0.0–0.2)

## 2020-07-06 LAB — RESP PANEL BY RT-PCR (FLU A&B, COVID) ARPGX2
Influenza A by PCR: NEGATIVE
Influenza B by PCR: NEGATIVE
SARS Coronavirus 2 by RT PCR: NEGATIVE

## 2020-07-06 LAB — COMPREHENSIVE METABOLIC PANEL
ALT: 21 U/L (ref 0–44)
AST: 25 U/L (ref 15–41)
Albumin: 3.5 g/dL (ref 3.5–5.0)
Alkaline Phosphatase: 71 U/L (ref 38–126)
Anion gap: 7 (ref 5–15)
BUN: 31 mg/dL — ABNORMAL HIGH (ref 6–20)
CO2: 24 mmol/L (ref 22–32)
Calcium: 8.7 mg/dL — ABNORMAL LOW (ref 8.9–10.3)
Chloride: 104 mmol/L (ref 98–111)
Creatinine, Ser: 2.58 mg/dL — ABNORMAL HIGH (ref 0.61–1.24)
GFR, Estimated: 28 mL/min — ABNORMAL LOW (ref >60)
Glucose, Bld: 324 mg/dL — ABNORMAL HIGH (ref 70–99)
Potassium: 3.4 mmol/L — ABNORMAL LOW (ref 3.5–5.1)
Sodium: 135 mmol/L (ref 135–145)
Total Bilirubin: 0.6 mg/dL (ref 0.3–1.2)
Total Protein: 7.1 g/dL (ref 6.5–8.1)

## 2020-07-06 LAB — TROPONIN I (HIGH SENSITIVITY)
Troponin I (High Sensitivity): 41 ng/L — ABNORMAL HIGH (ref <18)
Troponin I (High Sensitivity): 40 ng/L — ABNORMAL HIGH (ref <18)

## 2020-07-06 LAB — BRAIN NATRIURETIC PEPTIDE: B Natriuretic Peptide: 570.7 pg/mL — ABNORMAL HIGH (ref 0.0–100.0)

## 2020-07-06 LAB — LIPASE, BLOOD: Lipase: 27 U/L (ref 11–51)

## 2020-07-06 MED ORDER — ONDANSETRON HCL 4 MG/2ML IJ SOLN
4.0000 mg | Freq: Once | INTRAMUSCULAR | Status: AC
  Administered 2020-07-06: 4 mg via INTRAVENOUS
  Filled 2020-07-06: qty 2

## 2020-07-06 MED ORDER — FENTANYL CITRATE (PF) 100 MCG/2ML IJ SOLN
50.0000 ug | Freq: Once | INTRAMUSCULAR | Status: AC
  Administered 2020-07-06: 50 ug via INTRAVENOUS
  Filled 2020-07-06: qty 2

## 2020-07-06 NOTE — ED Triage Notes (Signed)
Pt complains of chest pain and leg swelling x 2 days.

## 2020-07-06 NOTE — ED Notes (Signed)
Patient back from CT.

## 2020-07-06 NOTE — ED Provider Notes (Signed)
Emergency Medicine Provider Triage Evaluation Note  Ryan Jenkins 60 y.o. male was evaluated in triage.  Pt complains of chest pain that began yesterday.  He reports over the last 2 days, he has had some abdominal pain, vomiting.  He reports yesterday, started hurting.  He states that the sharp pain in middle of chest.  Does not radiate anywhere.  No associated diaphoresis, difficulty breathing.  He also has noted swelling in his bilateral lower extremities which he states is new.  No fevers, cough.   Review of Systems  Positive: Chest pain, abdominal pain, nausea/vomiting Negative: Fevers, difficulty breathing.  Physical Exam  BP 134/82   Pulse 70   Temp 98.2 F (36.8 C) (Oral)   Resp 18   Ht 5\' 4"  (1.626 m)   Wt 65.8 kg   SpO2 100%   BMI 24.89 kg/m  Gen:   Awake, no distress   HEENT:  Atraumatic  Resp:  Normal effort.  CTA B Cardiac:  Normal rate  Abd:   Nondistended, nontender  MSK:   Moves extremities without difficulty  Neuro:  Speech clear   Other:   2+ pitting edema noted bilateral extremities.  Medical Decision Making  Medically screening exam initiated at 6:57 PM  Appropriate orders placed.  Ryan Jenkins was informed that the remainder of the evaluation will be completed by another provider, this initial triage assessment does not replace that evaluation. They are counseled that they will need to remain in the ED until the completion of their workup, including full H&P and results of any tests.  Risks of leaving the emergency department prior to completion of treatment were discussed. Patient was advised to inform ED staff if they are leaving before their treatment is complete. The patient acknowledged these risks and time was allowed for questions.     The patient appears stable so that the remainder of the MSE may be completed by another provider.    Clinical Impression  Chest pain   Portions of this note were generated with Dragon dictation software.  Dictation errors may occur despite best attempts at proofreading.     Volanda Napoleon, PA-C 07/06/20 1858    Truddie Hidden, MD 07/06/20 2228

## 2020-07-07 ENCOUNTER — Observation Stay (HOSPITAL_BASED_OUTPATIENT_CLINIC_OR_DEPARTMENT_OTHER): Payer: Medicaid Other

## 2020-07-07 ENCOUNTER — Observation Stay (HOSPITAL_COMMUNITY): Payer: Medicaid Other

## 2020-07-07 ENCOUNTER — Encounter (HOSPITAL_COMMUNITY): Payer: Self-pay | Admitting: Internal Medicine

## 2020-07-07 DIAGNOSIS — E1140 Type 2 diabetes mellitus with diabetic neuropathy, unspecified: Secondary | ICD-10-CM

## 2020-07-07 DIAGNOSIS — I5033 Acute on chronic diastolic (congestive) heart failure: Secondary | ICD-10-CM

## 2020-07-07 DIAGNOSIS — R079 Chest pain, unspecified: Secondary | ICD-10-CM

## 2020-07-07 DIAGNOSIS — Z794 Long term (current) use of insulin: Secondary | ICD-10-CM

## 2020-07-07 DIAGNOSIS — R609 Edema, unspecified: Secondary | ICD-10-CM

## 2020-07-07 DIAGNOSIS — Z9114 Patient's other noncompliance with medication regimen: Secondary | ICD-10-CM

## 2020-07-07 DIAGNOSIS — N184 Chronic kidney disease, stage 4 (severe): Secondary | ICD-10-CM

## 2020-07-07 DIAGNOSIS — I10 Essential (primary) hypertension: Secondary | ICD-10-CM

## 2020-07-07 LAB — HEMOGLOBIN A1C
Hgb A1c MFr Bld: 7.8 % — ABNORMAL HIGH (ref 4.8–5.6)
Mean Plasma Glucose: 177.16 mg/dL

## 2020-07-07 LAB — CBC WITH DIFFERENTIAL/PLATELET
Abs Immature Granulocytes: 0.07 10*3/uL (ref 0.00–0.07)
Basophils Absolute: 0 10*3/uL (ref 0.0–0.1)
Basophils Relative: 0 %
Eosinophils Absolute: 0.1 10*3/uL (ref 0.0–0.5)
Eosinophils Relative: 1 %
HCT: 28.3 % — ABNORMAL LOW (ref 39.0–52.0)
Hemoglobin: 9.4 g/dL — ABNORMAL LOW (ref 13.0–17.0)
Immature Granulocytes: 1 %
Lymphocytes Relative: 21 %
Lymphs Abs: 1.6 10*3/uL (ref 0.7–4.0)
MCH: 29.9 pg (ref 26.0–34.0)
MCHC: 33.2 g/dL (ref 30.0–36.0)
MCV: 90.1 fL (ref 80.0–100.0)
Monocytes Absolute: 0.8 10*3/uL (ref 0.1–1.0)
Monocytes Relative: 10 %
Neutro Abs: 5.1 10*3/uL (ref 1.7–7.7)
Neutrophils Relative %: 67 %
Platelets: 247 10*3/uL (ref 150–400)
RBC: 3.14 MIL/uL — ABNORMAL LOW (ref 4.22–5.81)
RDW: 14.5 % (ref 11.5–15.5)
WBC: 7.7 10*3/uL (ref 4.0–10.5)
nRBC: 0 % (ref 0.0–0.2)

## 2020-07-07 LAB — GLUCOSE, CAPILLARY
Glucose-Capillary: 150 mg/dL — ABNORMAL HIGH (ref 70–99)
Glucose-Capillary: 159 mg/dL — ABNORMAL HIGH (ref 70–99)
Glucose-Capillary: 128 mg/dL — ABNORMAL HIGH (ref 70–99)
Glucose-Capillary: 134 mg/dL — ABNORMAL HIGH (ref 70–99)

## 2020-07-07 LAB — ECHOCARDIOGRAM COMPLETE
Area-P 1/2: 11.67 cm2
Height: 74 in
S' Lateral: 2.9 cm
Weight: 3283.97 oz

## 2020-07-07 LAB — BASIC METABOLIC PANEL
Anion gap: 6 (ref 5–15)
BUN: 25 mg/dL — ABNORMAL HIGH (ref 6–20)
CO2: 25 mmol/L (ref 22–32)
Calcium: 8.2 mg/dL — ABNORMAL LOW (ref 8.9–10.3)
Chloride: 104 mmol/L (ref 98–111)
Creatinine, Ser: 2.33 mg/dL — ABNORMAL HIGH (ref 0.61–1.24)
GFR, Estimated: 31 mL/min — ABNORMAL LOW (ref >60)
Glucose, Bld: 196 mg/dL — ABNORMAL HIGH (ref 70–99)
Potassium: 3.2 mmol/L — ABNORMAL LOW (ref 3.5–5.1)
Sodium: 135 mmol/L (ref 135–145)

## 2020-07-07 LAB — CBG MONITORING, ED: Glucose-Capillary: 213 mg/dL — ABNORMAL HIGH (ref 70–99)

## 2020-07-07 MED ORDER — TAMSULOSIN HCL 0.4 MG PO CAPS
0.4000 mg | ORAL_CAPSULE | Freq: Every day | ORAL | Status: DC
  Administered 2020-07-07 – 2020-07-09 (×3): 0.4 mg via ORAL
  Filled 2020-07-07 (×3): qty 1

## 2020-07-07 MED ORDER — ROSUVASTATIN CALCIUM 20 MG PO TABS: 20.0000 mg | ORAL_TABLET | Freq: Every day | ORAL | Status: DC

## 2020-07-07 MED ORDER — HYDRALAZINE HCL 25 MG PO TABS: 25.0000 mg | ORAL_TABLET | Freq: Four times a day (QID) | ORAL | Status: DC | PRN

## 2020-07-07 MED ORDER — METOPROLOL SUCCINATE ER 50 MG PO TB24
150.0000 mg | ORAL_TABLET | Freq: Every day | ORAL | Status: DC
  Administered 2020-07-07: 150 mg via ORAL
  Filled 2020-07-07: qty 1

## 2020-07-07 MED ORDER — NORTRIPTYLINE HCL 25 MG PO CAPS
25.0000 mg | ORAL_CAPSULE | Freq: Every day | ORAL | Status: DC
  Administered 2020-07-07 – 2020-07-08 (×2): 25 mg via ORAL
  Filled 2020-07-07 (×3): qty 1

## 2020-07-07 MED ORDER — TRAZODONE HCL 50 MG PO TABS: 150.0000 mg | ORAL_TABLET | Freq: Every day | ORAL | Status: DC

## 2020-07-07 MED ORDER — HYDRALAZINE HCL 50 MG PO TABS
125.0000 mg | ORAL_TABLET | Freq: Three times a day (TID) | ORAL | Status: DC
  Administered 2020-07-07 – 2020-07-09 (×6): 125 mg via ORAL
  Filled 2020-07-07 (×6): qty 1

## 2020-07-07 MED ORDER — TRAZODONE HCL 100 MG PO TABS
100.0000 mg | ORAL_TABLET | Freq: Every day | ORAL | Status: DC
  Administered 2020-07-07 – 2020-07-08 (×2): 100 mg via ORAL
  Filled 2020-07-07 (×2): qty 1

## 2020-07-07 MED ORDER — OXYCODONE-ACETAMINOPHEN 5-325 MG PO TABS
1.0000 | ORAL_TABLET | Freq: Four times a day (QID) | ORAL | Status: DC | PRN
  Administered 2020-07-07 – 2020-07-09 (×5): 1 via ORAL
  Filled 2020-07-07 (×5): qty 1

## 2020-07-07 MED ORDER — ACETAMINOPHEN 325 MG PO TABS
650.0000 mg | ORAL_TABLET | ORAL | Status: DC | PRN
  Administered 2020-07-07: 650 mg via ORAL
  Filled 2020-07-07: qty 2

## 2020-07-07 MED ORDER — LACTATED RINGERS IV SOLN: INTRAVENOUS | Status: DC

## 2020-07-07 MED ORDER — INSULIN ASPART 100 UNIT/ML IJ SOLN
0.0000 [IU] | Freq: Three times a day (TID) | INTRAMUSCULAR | Status: DC
  Administered 2020-07-07: 2 [IU] via SUBCUTANEOUS
  Administered 2020-07-07 – 2020-07-08 (×3): 1 [IU] via SUBCUTANEOUS
  Administered 2020-07-08 – 2020-07-09 (×2): 2 [IU] via SUBCUTANEOUS
  Administered 2020-07-09: 1 [IU] via SUBCUTANEOUS
  Filled 2020-07-07: qty 0.09

## 2020-07-07 MED ORDER — ALPRAZOLAM 0.25 MG PO TABS
0.2500 mg | ORAL_TABLET | Freq: Two times a day (BID) | ORAL | Status: DC | PRN
  Administered 2020-07-07 (×2): 0.25 mg via ORAL
  Filled 2020-07-07 (×2): qty 1

## 2020-07-07 MED ORDER — ZOLPIDEM TARTRATE 5 MG PO TABS
5.0000 mg | ORAL_TABLET | Freq: Every evening | ORAL | Status: DC | PRN
  Administered 2020-07-07 – 2020-07-08 (×2): 5 mg via ORAL
  Filled 2020-07-07 (×3): qty 1

## 2020-07-07 MED ORDER — RENA-VITE PO TABS
1.0000 | ORAL_TABLET | Freq: Every day | ORAL | Status: DC
  Administered 2020-07-07 – 2020-07-08 (×2): 1 via ORAL
  Filled 2020-07-07 (×3): qty 1

## 2020-07-07 MED ORDER — ROSUVASTATIN CALCIUM 20 MG PO TABS
20.0000 mg | ORAL_TABLET | Freq: Every day | ORAL | Status: DC
  Administered 2020-07-07 – 2020-07-09 (×3): 20 mg via ORAL
  Filled 2020-07-07 (×3): qty 1

## 2020-07-07 MED ORDER — POTASSIUM CHLORIDE CRYS ER 20 MEQ PO TBCR
40.0000 meq | EXTENDED_RELEASE_TABLET | Freq: Once | ORAL | Status: AC
  Administered 2020-07-07: 40 meq via ORAL
  Filled 2020-07-07: qty 2

## 2020-07-07 MED ORDER — CLONIDINE HCL 0.1 MG PO TABS
0.1000 mg | ORAL_TABLET | Freq: Three times a day (TID) | ORAL | Status: DC
  Administered 2020-07-07 – 2020-07-09 (×9): 0.1 mg via ORAL
  Filled 2020-07-07 (×9): qty 1

## 2020-07-07 MED ORDER — ESCITALOPRAM OXALATE 20 MG PO TABS
20.0000 mg | ORAL_TABLET | Freq: Every day | ORAL | Status: DC
  Administered 2020-07-07 – 2020-07-09 (×3): 20 mg via ORAL
  Filled 2020-07-07 (×3): qty 1

## 2020-07-07 MED ORDER — CHLORTHALIDONE 25 MG PO TABS
12.5000 mg | ORAL_TABLET | ORAL | Status: DC
  Administered 2020-07-07 – 2020-07-09 (×2): 12.5 mg via ORAL
  Filled 2020-07-07 (×2): qty 0.5

## 2020-07-07 MED ORDER — CARVEDILOL 3.125 MG PO TABS
3.1250 mg | ORAL_TABLET | Freq: Two times a day (BID) | ORAL | Status: DC
  Administered 2020-07-07: 3.125 mg via ORAL
  Filled 2020-07-07: qty 1

## 2020-07-07 MED ORDER — FINASTERIDE 5 MG PO TABS
5.0000 mg | ORAL_TABLET | Freq: Every day | ORAL | Status: DC
  Administered 2020-07-07 – 2020-07-09 (×3): 5 mg via ORAL
  Filled 2020-07-07 (×3): qty 1

## 2020-07-07 MED ORDER — AMLODIPINE BESYLATE 10 MG PO TABS
10.0000 mg | ORAL_TABLET | Freq: Every day | ORAL | Status: DC
  Administered 2020-07-07 – 2020-07-09 (×3): 10 mg via ORAL
  Filled 2020-07-07 (×3): qty 1

## 2020-07-07 MED ORDER — HYDRALAZINE HCL 25 MG PO TABS
25.0000 mg | ORAL_TABLET | Freq: Three times a day (TID) | ORAL | Status: DC
  Administered 2020-07-07 (×2): 25 mg via ORAL
  Filled 2020-07-07 (×2): qty 1

## 2020-07-07 MED ORDER — METOPROLOL SUCCINATE ER 50 MG PO TB24
150.0000 mg | ORAL_TABLET | Freq: Every day | ORAL | Status: DC
  Administered 2020-07-07 – 2020-07-09 (×3): 150 mg via ORAL
  Filled 2020-07-07 (×3): qty 1

## 2020-07-07 MED ORDER — ALUM & MAG HYDROXIDE-SIMETH 200-200-20 MG/5ML PO SUSP
30.0000 mL | ORAL | Status: DC | PRN
  Administered 2020-07-07 – 2020-07-08 (×2): 30 mL via ORAL
  Filled 2020-07-07 (×2): qty 30

## 2020-07-07 MED ORDER — ENOXAPARIN SODIUM 30 MG/0.3ML IJ SOSY
30.0000 mg | PREFILLED_SYRINGE | INTRAMUSCULAR | Status: DC
  Administered 2020-07-07 – 2020-07-08 (×2): 30 mg via SUBCUTANEOUS
  Filled 2020-07-07 (×2): qty 0.3

## 2020-07-07 MED ORDER — HYDRALAZINE HCL 100 MG PO TABS: 100.0000 mg | ORAL_TABLET | Freq: Three times a day (TID) | ORAL | Status: DC

## 2020-07-07 MED ORDER — ONDANSETRON HCL 4 MG/2ML IJ SOLN: 4.0000 mg | Freq: Four times a day (QID) | INTRAMUSCULAR | Status: DC | PRN

## 2020-07-07 MED ORDER — INSULIN ASPART 100 UNIT/ML IJ SOLN
0.0000 [IU] | Freq: Every day | INTRAMUSCULAR | Status: DC
Start: 2020-07-07 — End: 2020-07-09
  Administered 2020-07-07: 2 [IU] via SUBCUTANEOUS
  Filled 2020-07-07: qty 0.05

## 2020-07-07 MED ORDER — GLUCERNA SHAKE PO LIQD
237.0000 mL | Freq: Three times a day (TID) | ORAL | Status: DC
  Administered 2020-07-07 – 2020-07-08 (×4): 237 mL via ORAL
  Filled 2020-07-07 (×8): qty 237

## 2020-07-07 MED ORDER — INSULIN GLARGINE 100 UNIT/ML ~~LOC~~ SOLN
10.0000 [IU] | Freq: Every day | SUBCUTANEOUS | Status: DC
  Administered 2020-07-07 – 2020-07-08 (×3): 10 [IU] via SUBCUTANEOUS
  Filled 2020-07-07 (×4): qty 0.1

## 2020-07-07 MED ORDER — ASPIRIN EC 81 MG PO TBEC
81.0000 mg | DELAYED_RELEASE_TABLET | Freq: Every day | ORAL | Status: DC
  Administered 2020-07-07 – 2020-07-09 (×3): 81 mg via ORAL
  Filled 2020-07-07 (×3): qty 1

## 2020-07-07 MED ORDER — DAPAGLIFLOZIN PROPANEDIOL 5 MG PO TABS
5.0000 mg | ORAL_TABLET | Freq: Every day | ORAL | Status: DC
  Administered 2020-07-07 – 2020-07-09 (×3): 5 mg via ORAL
  Filled 2020-07-07 (×3): qty 1

## 2020-07-07 MED ORDER — CLONIDINE HCL 0.1 MG/24HR TD PTWK: 0.1000 mg | MEDICATED_PATCH | TRANSDERMAL | Status: DC

## 2020-07-07 NOTE — Progress Notes (Addendum)
This is a pleasant 60 year old African-American gentleman with a history of type 2 diabetes mellitus, neuropathy, hypertension, CKD stage IV and possibly noncompliance was admitted early morning due to chest pain And hypertensive urgency.  Patient seen and examined, he still complains of chest pain but he points to lower chest and upper abdomen.  He tells me that his pain gets worse with eating food.  Gets better with not eating food.  Does not change with upper body movement.  It is crampy and radiating to the sides but not to the back.  No shortness of breath or any other complaint such as diaphoresis or fever.  Denies eating spicy food, drinking alcohol or smoking or doing any other illegal drugs.  On examination, lungs clear to auscultation, no tenderness on the abdominal examination, he has trace pitting edema bilateral lower extremities.  Cardiac enzymes only slightly elevated, not consistent with ACS.  Transthoracic echo done but results are pending.  Doubt ACS.  Blood pressure still elevated but he is about to get his home medications and I hope that his blood pressure will get controlled.  We will monitor closely.  He also tells me that he has been having back pain and this is started 2 days ago when he fell and he also fell yesterday.  He has point tenderness on the lower thoracic spine.  We will obtain x-ray spine.  He is also complaining of decreased range of motion in both knees, more so on the left knee.  On examination, he seems to have moderate to large joint effusion on the left knee and small to moderate amount of effusion on the right knee.  We will proceed with CT right and left knee.  He might need arthrocentesis.  We will recheck his BMP to check his renal function and make decision about [place].  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  Consulted orthopedic/[facility].

## 2020-07-07 NOTE — Progress Notes (Signed)
Initial Nutrition Assessment  DOCUMENTATION CODES:  Not applicable  INTERVENTION:  Add Glucerna Shake po TID, each supplement provides 220 kcal and 10 grams of protein.  Add Rena-Vite daily.  NUTRITION DIAGNOSIS:  Increased nutrient needs related to acute illness as evidenced by estimated needs.  GOAL:  Patient will meet greater than or equal to 90% of their needs  MONITOR:  PO intake, Supplement acceptance, Labs, Weight trends, I & O's  REASON FOR ASSESSMENT:  Malnutrition Screening Tool    ASSESSMENT:  60 yo male with a PMH of TDM2 with neuropathy, HTN, CKD stage 4, suspected non-adherence to medication / diet regimen.  Last year around this time pt was admitted for HTN urgency, uncontrolled T2DM with A1C of 13.3%. Did have urinary retention that was managed with foley for the next couple of months, foley ultimately removed. Pt had been in SNF up until ~2 weeks ago. Pt now presents to [facility] ED with chest pain.  RD working remotely.  Unable to reach pt via phone. Pt reported decreased appetite on MST. Per H&P, "Just developed nausea in the ED today. No N/V prior to today."  Per Epic, pt's weight has fluctuated similarly throughout the years. Pt weighs between 101-106 kg then it drops off to 93-97 kg a few months later and he regains the weight. Currently, pt has lost ~23 lbs (10%) of his body weight in the last 11 months, which is not necessarily significant for the time frame.  Recommend adding Glucerna shakes TID and Rena-Vite to promote intake.  Medications: reviewed; SSI, mealtime Novolog, bedtime Lantus, Percocet PRN (given twice today), Xanax PRN (given once today)  Labs: reviewed; K 3.2 (L), CBG 150-253 (H) HbA1c: 7.8% (06/2020)  NUTRITION - FOCUSED PHYSICAL EXAM: Unable to perform  Diet Order:   Diet Order             Diet heart healthy/carb modified Room service appropriate? Yes; Fluid consistency: Thin  Diet effective now                  EDUCATION NEEDS:   Education needs have been addressed  Skin:  Skin Assessment: Reviewed RN Assessment (Abrasions on both legs)  Last BM:  07/06/20  Height:  Ht Readings from Last 1 Encounters:  07/07/20 6\' 2"  (1.88 m)   Weight:  Wt Readings from Last 1 Encounters:  07/07/20 93.1 kg   Ideal Body Weight:  86.4 kg  BMI:  Body mass index is 26.35 kg/m.  Estimated Nutritional Needs:  Kcal:  2000-2200 Protein:  100-115 grams Fluid:  >2 L  Derrel Nip, RD, LDN (she/her/hers) Registered Dietitian I After-Hours/Weekend Pager # in [place]

## 2020-07-07 NOTE — Progress Notes (Signed)
  Echocardiogram 2D Echocardiogram has been performed.  Ryan Jenkins 07/07/2020, 9:17 AM

## 2020-07-07 NOTE — Progress Notes (Signed)
PT Cancellation Note  Patient Details Name: Ryan Jenkins MRN: [id number] DOB: 08-08-1960   Cancelled Treatment:    Reason Eval/Treat Not Completed: Patient at procedure or test/unavailable. Upon arrival, pt not in room. RN states pt down for CT and xray imaging. Will check back as schedule permits for PT eval.    Tori Avedis Bevis PT, DPT 07/07/20, 10:37 AM

## 2020-07-07 NOTE — Discharge Instructions (Signed)

## 2020-07-07 NOTE — ED Notes (Addendum)
Patient frustrated and said the RN did not put his covers on right. He said his legs were hanging off the bed. He said his dry brief has been on him all day. RN helped boost patient up in the bed, replaced his covers, brought another warm blanket and took his brief off. Patient said he wants every name of every body and doctor who worked on him. He is dissatisfied. He said "why did the doctor tell me I was getting a bed tonight and you tell me there are no beds upstairs."

## 2020-07-07 NOTE — H&P (Signed)
History and Physical    CONO GEBHARD QTM:[id number] DOB: Jan 13, 1961 DOA: 07/06/2020  PCP: Pcp, No  Patient coming from: Home  I have personally briefly reviewed patient's old medical records in [place]  Chief Complaint: CP  HPI: Ryan Jenkins is a 60 y.o. male with medical history significant of DM2 with neuropathy, HTN, CKD 4, suspected non-adherence to medication / diet regimen.  Last year around this time pt was admitted for HTN urgency, uncontrolled DM2 with A1C of 13.3%.  Pt then moved out of state to [place].  Pt was admitted to hospital in [place] from 10/19-11/11 with hypertensive urgency, initially had peripheral edema.  Diuresis with lasix attempted but this ended up with him in acute kidney failure, had respiratory failure due to pulmonary edema requiring intubation.  Ultimately spent several days getting dialysis before having renal recovery.  LHC at that admission = 60% stenosis of RCA nothing stent able; TEE = preserved EF, "unremarkable" according to hospitalist..  Since then baseline creat seems to be ~2.5 or so.  Did have urinary retention that was managed with foley for the next couple of months, foley ultimately removed.  Pt had been in SNF up until ~2 weeks ago.  Looks like DM2 and HTN had been fairly well controlled in SNF with 2 most recent A1Cs of 5.x and 7.0 most recently just 1 month ago (per PCP note).  BP on that PCP visit was 126/62.  Today pt presents to ED for evaluation of CP.  Symptoms onset with pedal edema in BLE on Monday of last week.  Since then developed onset of CP.  Sharp and heavy at times.  Just developed nausea in the ED today.  No N/V prior to today.  No SOB, no fever, no Cough.   ED Course: trops 41 and 40, BNP 570.  Creat 2.58.   Review of Systems: As per HPI, otherwise all review of systems negative.  Past Medical History:  Diagnosis Date   Abdominal pain 11/25/2018   Acid reflux 11/25/2018   Anxiety     Bilateral swelling of feet and ankles 11/25/2018   and legs   Change in bowel habits 11/25/2018   Constipation 11/25/2018   Depression    Diabetes mellitus without complication ([place])    Diabetic neuropathy ([place])    Diarrhea 11/25/2018   Fatigue 11/25/2018   GERD (gastroesophageal reflux disease)    Hypertension    Loss of appetite 11/25/2018   Muscle spasm 11/25/2018   Neuromuscular disorder ([place])    neuropathy   Retinopathy due to secondary diabetes [facility])    Sleep disturbances 11/25/2018   Vision changes 11/25/2018   Vitamin B12 deficiency     Past Surgical History:  Procedure Laterality Date   CATARACT EXTRACTION Bilateral 2017     reports that he has never smoked. He has never used smokeless tobacco. He reports that he does not drink alcohol and does not use drugs.  Allergies  Allergen Reactions   Claritin [Loratadine] Swelling    Joint swelling    Hydrochlorothiazide     Dizziness   Lyrica [Pregabalin]     depression   Metformin And Related     GI    Family History  Problem Relation Age of Onset   Renal cancer Mother    Hypertension Mother    Pancreatic cancer Mother    Hypertension Sister    Stroke Sister    Leukemia Maternal Uncle    Sickle cell trait Maternal Aunt  Colon cancer Neg Hx    Esophageal cancer Neg Hx    Rectal cancer Neg Hx    Stomach cancer Neg Hx      Prior to Admission medications   Medication Sig Start Date End Date Taking? Authorizing Provider  amLODipine (NORVASC) 10 MG tablet Take 1 tablet (10 mg total) by mouth daily. 07/24/19   Dwyane Dee, MD  atorvastatin (LIPITOR) 40 MG tablet Take 1 tablet (40 mg total) by mouth daily. 07/24/19   Dwyane Dee, MD  Blood Glucose Monitoring Suppl (TRUE METRIX METER) w/Device KIT Use as directed 02/05/17   Ladell Pier, MD  carvedilol (COREG) 3.125 MG tablet Take 1 tablet (3.125 mg total) by mouth 2 (two) times daily with a meal. 07/24/19   Dwyane Dee, MD  cetirizine (ZYRTEC) 10 MG  tablet Take 0.5 tablets (5 mg total) by mouth daily. 08/07/19   Ladell Pier, MD  cyclobenzaprine (FLEXERIL) 5 MG tablet Take 1 tablet (5 mg total) by mouth 2 (two) times daily as needed for muscle spasms (medication can cause drowsiness). 07/24/19   Dwyane Dee, MD  escitalopram (LEXAPRO) 20 MG tablet Take 1 tablet (20 mg total) by mouth daily. 07/24/19   Dwyane Dee, MD  furosemide (LASIX) 20 MG tablet Take 1 tablet (20 mg total) by mouth daily. 08/07/19   Ladell Pier, MD  gabapentin (NEURONTIN) 300 MG capsule Take 1 capsule (300 mg total) by mouth 3 (three) times daily. 07/24/19   Dwyane Dee, MD  glucose blood test strip Use as instructed 07/28/18   Ladell Pier, MD  HYDROcodone-acetaminophen (NORCO/VICODIN) 5-325 MG tablet Take 1-2 tablets by mouth every 6 (six) hours as needed for severe pain. 09/18/19   Wieters, Hallie C, PA-C  insulin glargine (LANTUS SOLOSTAR) 100 UNIT/ML Solostar Pen Inject 46 Units into the skin daily at 10 pm. 07/24/19   Dwyane Dee, MD  insulin lispro (HUMALOG KWIKPEN) 100 UNIT/ML KwikPen 14 units subcu before breakfast and 17 units with lunch and  dinner. 08/07/19   Ladell Pier, MD  Insulin Pen Needle (PEN NEEDLES) 31G X 8 MM MISC Use as directed 07/28/19   Ladell Pier, MD  lisinopril (ZESTRIL) 40 MG tablet Take 1 tablet (40 mg total) by mouth daily. 07/25/19   Dwyane Dee, MD  ondansetron (ZOFRAN) 4 MG tablet Take 1 tablet (4 mg total) by mouth every 8 (eight) hours as needed for nausea or vomiting. 07/24/19   Dwyane Dee, MD  pantoprazole (PROTONIX) 40 MG tablet Take 1 tablet (40 mg total) by mouth daily. 07/24/19   Dwyane Dee, MD  TRUEPLUS LANCETS 28G MISC Use as directed 02/05/17   Ladell Pier, MD  vitamin B-12 (CYANOCOBALAMIN) 1000 MCG tablet Take 1 tablet (1,000 mcg total) by mouth daily. 06/03/18   Ladell Pier, MD    Physical Exam: Vitals:   07/06/20 1930 07/06/20 2105 07/06/20 2200 07/06/20 2300  BP: (!) 166/86  (!) 178/115 (!) 174/99 (!) 181/96  Pulse: 98 99 (!) 105 (!) 104  Resp: 12 19 20 16   Temp:      TempSrc:      SpO2: 98% 100% 100% 100%    Constitutional: NAD, calm, comfortable Eyes: PERRL, lids and conjunctivae normal ENMT: Mucous membranes are moist. Posterior pharynx clear of any exudate or lesions.Normal dentition.  Neck: normal, supple, no masses, no thyromegaly Respiratory: clear to auscultation bilaterally, no wheezing, no crackles. Normal respiratory effort. No accessory muscle use.  Cardiovascular: Regular rate and rhythm, no  murmurs / rubs / gallops. 2+ BLE edema. 2+ pedal pulses. No carotid bruits.  Abdomen: no tenderness, no masses palpated. No hepatosplenomegaly. Bowel sounds positive.  Musculoskeletal: no clubbing / cyanosis. No joint deformity upper and lower extremities. Good ROM, no contractures. Normal muscle tone.  Skin: no rashes, lesions, ulcers. No induration Neurologic: CN 2-12 grossly intact. Sensation intact, DTR normal. Strength 5/5 in all 4.  Psychiatric: Normal judgment and insight. Alert and oriented x 3. Normal mood.    Labs on Admission: I have personally reviewed following labs and imaging studies  CBC: Recent Labs  Lab 07/06/20 1901  WBC 8.0  HGB 10.4*  HCT 31.9*  MCV 90.4  PLT 354   Basic Metabolic Panel: Recent Labs  Lab 07/06/20 1901  NA 135  K 3.4*  CL 104  CO2 24  GLUCOSE 324*  BUN 31*  CREATININE 2.58*  CALCIUM 8.7*   GFR: CrCl cannot be calculated (Unknown ideal weight.). Liver Function Tests: Recent Labs  Lab 07/06/20 1901  AST 25  ALT 21  ALKPHOS 71  BILITOT 0.6  PROT 7.1  ALBUMIN 3.5   Recent Labs  Lab 07/06/20 1901  LIPASE 27   No results for input(s): AMMONIA in the last 168 hours. Coagulation Profile: No results for input(s): INR, PROTIME in the last 168 hours. Cardiac Enzymes: No results for input(s): CKTOTAL, CKMB, CKMBINDEX, TROPONINI in the last 168 hours. BNP (last 3 results) No results for  input(s): PROBNP in the last 8760 hours. HbA1C: No results for input(s): HGBA1C in the last 72 hours. CBG: No results for input(s): GLUCAP in the last 168 hours. Lipid Profile: No results for input(s): CHOL, HDL, LDLCALC, TRIG, CHOLHDL, LDLDIRECT in the last 72 hours. Thyroid Function Tests: No results for input(s): TSH, T4TOTAL, FREET4, T3FREE, THYROIDAB in the last 72 hours. Anemia Panel: No results for input(s): VITAMINB12, FOLATE, FERRITIN, TIBC, IRON, RETICCTPCT in the last 72 hours. Urine analysis:    Component Value Date/Time   COLORURINE STRAW (A) 07/22/2019 2000   APPEARANCEUR CLEAR 07/22/2019 2000   LABSPEC 1.013 07/22/2019 2000   PHURINE 6.0 07/22/2019 2000   GLUCOSEU >=500 (A) 07/22/2019 2000   HGBUR SMALL (A) 07/22/2019 2000   BILIRUBINUR NEGATIVE 07/22/2019 2000   KETONESUR NEGATIVE 07/22/2019 2000   PROTEINUR >=300 (A) 07/22/2019 2000   NITRITE NEGATIVE 07/22/2019 2000   LEUKOCYTESUR NEGATIVE 07/22/2019 2000    Radiological Exams on Admission: CT Abdomen Pelvis Wo Contrast  Result Date: 07/06/2020 CLINICAL DATA:  Chest and abdominal pain EXAM: CT ABDOMEN AND PELVIS WITHOUT CONTRAST TECHNIQUE: Multidetector CT imaging of the abdomen and pelvis was performed following the standard protocol without IV contrast. COMPARISON:  None. FINDINGS: Lower chest: No acute abnormality. Hepatobiliary: No focal liver abnormality is seen. No gallstones, gallbladder wall thickening, or biliary dilatation. Pancreas: Unremarkable. No pancreatic ductal dilatation or surrounding inflammatory changes. Spleen: Normal in size without focal abnormality. Adrenals/Urinary Tract: Right adrenal gland is within normal limits. 3 cm hypodense lesion in the left adrenal gland is noted likely representing an adenoma. Kidneys are well visualize without evidence of renal calculi or obstructive changes. Mild perinephric stranding is noted of uncertain significance. Ureters are within normal limits. Bladder is  partially distended. Stomach/Bowel: The appendix is well visualized and within normal limits. No obstructive or inflammatory changes of the colon are seen. Small bowel and stomach are unremarkable. Vascular/Lymphatic: No significant vascular findings are present. No enlarged abdominal or pelvic lymph nodes. Reproductive: Prostate is unremarkable. Calcification of the seminal vesicles  and vas deferens is noted. Other: No abdominal wall hernia or abnormality. No abdominopelvic ascites. Musculoskeletal: Degenerative changes of lumbar spine are noted. IMPRESSION: No acute abnormality is noted within the abdomen. 3 cm hypodense lesion in the left adrenal gland likely representing an adenoma. No other focal abnormality is noted. Electronically Signed   By: Inez Catalina M.D.   On: 07/06/2020 23:28   DG Chest 2 View  Result Date: 07/06/2020 CLINICAL DATA:  Chest pain EXAM: CHEST - 2 VIEW COMPARISON:  07/22/2019 FINDINGS: The heart size and mediastinal contours are within normal limits. Both lungs are clear. The visualized skeletal structures are unremarkable. IMPRESSION: No active cardiopulmonary disease. Electronically Signed   By: Fidela Salisbury MD   On: 07/06/2020 20:06    EKG: Independently reviewed.  Assessment/Plan Principal Problem:   Chest pain Active Problems:   Diabetic neuropathy, type II diabetes mellitus (HCC)   Essential hypertension   History of medication noncompliance   CKD (chronic kidney disease) stage 4, GFR 15-29 ml/min (HCC)   Acute on chronic diastolic CHF (congestive heart failure) (HCC)    CP / HTN urgency - Maybe with a bit of acute on chronic diastolic CHF Thinking that BP control is the biggest issue here, more so than fluid retention. Suspect that meds he was taking while in SNF may not be the same (dosing and frequency) as meds he is now taking out of SNF! Plan: Get med rec done STAT Resume home meds that we think he is on / should be on according to med rec and see how  BP / BGL / CP responds Will get updated 2d echo Doubt very much that with stable baseline low trops, 60% RCA dz on LHC in oct last year, and CKD 4 (that ended up with him on dialysis shortly after getting LHC in oct last year), that cardiology is going to do a LHC on this patient this admission. CKD 4 - Does need to follow up with nephrology in the local area to establish care Creat 2.5 believed to be baseline (actually had been running 3.0 for several months after hospital stay before coming down to 2.5 in March with his nephrologist). DM2 - Cont Lantus 10 QHS Cont Farxiga Add sensitive SSI AC/HS HTN - Resume home meds once med rec completed  DVT prophylaxis: Lovenox Code Status: Full Family Communication: No family in room Disposition Plan: Home after HTN treatment, CP treatment Consults called: None Admission status: Place in obs    Erik Nessel, [place] [facility]  How to contact the [facility] Attending or Consulting provider [place] or covering provider during after hours [place], for this patient?  Check the care team in [facility] and look for a) attending/consulting [facility] provider listed and b) the [facility] team listed Log into [email or web address]  Amion Physician Scheduling and messaging for groups and whole hospitals  On call and physician scheduling software for group practices, residents, hospitalists and other medical providers for call, clinic, rotation and shift schedules. On[employer] is a hospital-wide system for scheduling doctors and paging doctors on call. EasyPlot is for scientific plotting and data analysis.  [email or web address]  and use 's universal password to access. If you do not have the password, please contact the hospital operator.  Locate the [facility] provider you are looking for under [facility] and page to a number that you can be directly reached. If you still have difficulty reaching the provider, please page the [facility] (Director on Call) for  the Hospitalists  listed on amion for assistance.  07/07/2020, 12:56 AM

## 2020-07-07 NOTE — ED Provider Notes (Signed)
[facility] DEPT Provider Note   CSN: [id number] Arrival date & time: 07/06/20  1821     History Chief Complaint  Patient presents with   Chest Pain    Ryan Jenkins is a 60 y.o. male past medical history of CHF, diabetes, hypertension, GERD, CKD, presenting to the emergency department with chief complaint of chest pain.  Patient has complicated recent medical history.  States he was on "life support."  Reports he was admitted around October of last year for heart failure and renal failure.  States he was in the hospital for multiple months, however is unable to give me any other detail.  He was then in inpatient rehab for some time and recently was discharged.  All of this occurred right after he moved to [place] from this area.  A week ago he moved back here to [facility] to be close to family.  Patient presents for evaluation of chest pain.  He states symptoms initially began with pedal edema on Monday of last week. Chest pain is described as both sharp and heavy at times.  He has associated chills without shortness of breath or fever.  He has not been having cough.  He just developed nausea while in the ED today, however has not been having nausea or vomiting over the last week.  He does complain of epigastric abdominal pain without diarrhea or constipation.  Wonders if he is having an ulcer. No known COVID exposures.  He is concerned for the swelling in his legs and developing recurrent heart failure exacerbation.   The history is provided by the patient.      Past Medical History:  Diagnosis Date   Abdominal pain 11/25/2018   Acid reflux 11/25/2018   Anxiety    Bilateral swelling of feet and ankles 11/25/2018   and legs   Change in bowel habits 11/25/2018   Constipation 11/25/2018   Depression    Diabetes mellitus without complication ([place])    Diabetic neuropathy ([place])    Diarrhea 11/25/2018   Fatigue 11/25/2018   GERD (gastroesophageal  reflux disease)    Hypertension    Loss of appetite 11/25/2018   Muscle spasm 11/25/2018   Neuromuscular disorder ([place])    neuropathy   Retinopathy due to secondary diabetes ([place])    Sleep disturbances 11/25/2018   Vision changes 11/25/2018   Vitamin B12 deficiency     Patient Active Problem List   Diagnosis Date Noted   CKD (chronic kidney disease) stage 4, GFR 15-29 ml/min ([place]) 07/07/2020   Chest pain 07/07/2020   Acute on chronic diastolic CHF (congestive heart failure) ([place]) 07/07/2020   Erectile dysfunction associated with type 2 diabetes mellitus ([place]) 08/07/2019   Hives 08/07/2019   Vitreous hemorrhage of left eye ([place]) 07/22/2019   Elevated troponin 07/22/2019   Vision loss of left eye 07/22/2019   History of medication noncompliance 04/02/2019   Gastric ulcer without hemorrhage or perforation 12/25/2018   CKD (chronic kidney disease), stage III ([place]) 12/25/2018   Gastroesophageal reflux disease without esophagitis 09/04/2018   Diabetic retinopathy of both eyes associated with type 2 diabetes mellitus ([place]) 01/03/2018   Intermittent diarrhea 09/27/2017   Gastroparesis 09/27/2017   Macroalbuminuric diabetic nephropathy ([place]) 06/25/2017   History of falling 06/25/2017   Hyperlipidemia 03/21/2017   Vitamin B 12 deficiency 03/21/2017   Uncontrolled type 2 diabetes mellitus with peripheral neuropathy ([place]) 02/05/2017   Essential hypertension 02/05/2017   Depression 02/05/2017   Unintended weight loss 02/05/2017  Gait disturbance 02/05/2017   Pronation deformity of both feet 05/11/2014   Diabetic neuropathy, type II diabetes mellitus ([place]) 05/11/2014   Metatarsal deformity 05/11/2014    Past Surgical History:  Procedure Laterality Date   CATARACT EXTRACTION Bilateral 2017       Family History  Problem Relation Age of Onset   Renal cancer Mother    Hypertension Mother    Pancreatic cancer Mother    Hypertension Sister    Stroke Sister    Leukemia Maternal  Uncle    Sickle cell trait Maternal Aunt    Colon cancer Neg Hx    Esophageal cancer Neg Hx    Rectal cancer Neg Hx    Stomach cancer Neg Hx     Social History   Tobacco Use   Smoking status: Never   Smokeless tobacco: Never  Vaping Use   Vaping Use: Never used  Substance Use Topics   Alcohol use: No    Alcohol/week: 0.0 standard drinks   Drug use: No    Home Medications Prior to Admission medications   Medication Sig Start Date End Date Taking? Authorizing Provider  amLODipine (NORVASC) 10 MG tablet Take 1 tablet (10 mg total) by mouth daily. 07/24/19   Dwyane Dee, MD  atorvastatin (LIPITOR) 40 MG tablet Take 1 tablet (40 mg total) by mouth daily. 07/24/19   Dwyane Dee, MD  Blood Glucose Monitoring Suppl (TRUE METRIX METER) w/Device KIT Use as directed 02/05/17   Ladell Pier, MD  carvedilol (COREG) 3.125 MG tablet Take 1 tablet (3.125 mg total) by mouth 2 (two) times daily with a meal. 07/24/19   Dwyane Dee, MD  cetirizine (ZYRTEC) 10 MG tablet Take 0.5 tablets (5 mg total) by mouth daily. 08/07/19   Ladell Pier, MD  cyclobenzaprine (FLEXERIL) 5 MG tablet Take 1 tablet (5 mg total) by mouth 2 (two) times daily as needed for muscle spasms (medication can cause drowsiness). 07/24/19   Dwyane Dee, MD  escitalopram (LEXAPRO) 20 MG tablet Take 1 tablet (20 mg total) by mouth daily. 07/24/19   Dwyane Dee, MD  furosemide (LASIX) 20 MG tablet Take 1 tablet (20 mg total) by mouth daily. 08/07/19   Ladell Pier, MD  gabapentin (NEURONTIN) 300 MG capsule Take 1 capsule (300 mg total) by mouth 3 (three) times daily. 07/24/19   Dwyane Dee, MD  glucose blood test strip Use as instructed 07/28/18   Ladell Pier, MD  HYDROcodone-acetaminophen (NORCO/VICODIN) 5-325 MG tablet Take 1-2 tablets by mouth every 6 (six) hours as needed for severe pain. 09/18/19   Wieters, Hallie C, PA-C  insulin glargine (LANTUS SOLOSTAR) 100 UNIT/ML Solostar Pen Inject 46 Units into the  skin daily at 10 pm. 07/24/19   Dwyane Dee, MD  insulin lispro (HUMALOG KWIKPEN) 100 UNIT/ML KwikPen 14 units subcu before breakfast and 17 units with lunch and  dinner. 08/07/19   Ladell Pier, MD  Insulin Pen Needle (PEN NEEDLES) 31G X 8 MM MISC Use as directed 07/28/19   Ladell Pier, MD  lisinopril (ZESTRIL) 40 MG tablet Take 1 tablet (40 mg total) by mouth daily. 07/25/19   Dwyane Dee, MD  ondansetron (ZOFRAN) 4 MG tablet Take 1 tablet (4 mg total) by mouth every 8 (eight) hours as needed for nausea or vomiting. 07/24/19   Dwyane Dee, MD  pantoprazole (PROTONIX) 40 MG tablet Take 1 tablet (40 mg total) by mouth daily. 07/24/19   Dwyane Dee, MD  [place]  Use as directed 02/05/17   Ladell Pier, MD  vitamin B-12 (CYANOCOBALAMIN) 1000 MCG tablet Take 1 tablet (1,000 mcg total) by mouth daily. 06/03/18   Ladell Pier, MD    Allergies    Claritin [loratadine], Hydrochlorothiazide, Lyrica [pregabalin], and Metformin and related  Review of Systems   Review of Systems  Constitutional:  Positive for chills.  Cardiovascular:  Positive for chest pain and leg swelling.  Gastrointestinal:  Positive for abdominal pain, nausea and vomiting.  Musculoskeletal:  Positive for back pain.  All other systems reviewed and are negative.  Physical Exam Updated Vital Signs BP (!) 181/96   Pulse (!) 104   Temp 98.9 F (37.2 C) (Oral)   Resp 16   SpO2 100%   Physical Exam Vitals and nursing note reviewed.  Constitutional:      Appearance: He is well-developed.  HENT:     Head: Normocephalic and atraumatic.  Eyes:     Conjunctiva/sclera: Conjunctivae normal.  Cardiovascular:     Rate and Rhythm: Normal rate and regular rhythm.  Pulmonary:     Effort: Pulmonary effort is normal. No respiratory distress.     Breath sounds: Normal breath sounds.  Abdominal:     General: Bowel sounds are normal.     Palpations: Abdomen is soft.     Tenderness: There is  generalized abdominal tenderness. There is no guarding or rebound.  Musculoskeletal:     Right lower leg: Edema present.     Left lower leg: Edema present.     Comments: Bilateral pitting pedal lower extremity edema  Skin:    General: Skin is warm.  Neurological:     Mental Status: He is alert.  Psychiatric:        Behavior: Behavior normal.    ED Results / Procedures / Treatments   Labs (all labs ordered are listed, but only abnormal results are displayed) Labs Reviewed  CBC - Abnormal; Notable for the following components:      Result Value   RBC 3.53 (*)    Hemoglobin 10.4 (*)    HCT 31.9 (*)    All other components within normal limits  COMPREHENSIVE METABOLIC PANEL - Abnormal; Notable for the following components:   Potassium 3.4 (*)    Glucose, Bld 324 (*)    BUN 31 (*)    Creatinine, Ser 2.58 (*)    Calcium 8.7 (*)    GFR, Estimated 28 (*)    All other components within normal limits  BRAIN NATRIURETIC PEPTIDE - Abnormal; Notable for the following components:   B Natriuretic Peptide 570.7 (*)    All other components within normal limits  TROPONIN I (HIGH SENSITIVITY) - Abnormal; Notable for the following components:   Troponin I (High Sensitivity) 41 (*)    All other components within normal limits  TROPONIN I (HIGH SENSITIVITY) - Abnormal; Notable for the following components:   Troponin I (High Sensitivity) 40 (*)    All other components within normal limits  RESP PANEL BY RT-PCR (FLU A&B, COVID) ARPGX2  LIPASE, BLOOD  HEMOGLOBIN A1C    EKG EKG Interpretation  Date/Time:  Wednesday July 06 2020 18:51:19 EDT Ventricular Rate:  99 PR Interval:  167 QRS Duration: 106 QT Interval:  383 QTC Calculation: 492 R Axis:   -32 Text Interpretation: Sinus rhythm LVH with secondary repolarization abnormality Anterior infarct, old No acute changes Nonspecific ST and T wave abnormality No significant change since last tracing Confirmed by Varney Biles [id number]) on  07/06/2020 11:49:34 PM  Radiology CT Abdomen Pelvis Wo Contrast  Result Date: 07/06/2020 CLINICAL DATA:  Chest and abdominal pain EXAM: CT ABDOMEN AND PELVIS WITHOUT CONTRAST TECHNIQUE: Multidetector CT imaging of the abdomen and pelvis was performed following the standard protocol without IV contrast. COMPARISON:  None. FINDINGS: Lower chest: No acute abnormality. Hepatobiliary: No focal liver abnormality is seen. No gallstones, gallbladder wall thickening, or biliary dilatation. Pancreas: Unremarkable. No pancreatic ductal dilatation or surrounding inflammatory changes. Spleen: Normal in size without focal abnormality. Adrenals/Urinary Tract: Right adrenal gland is within normal limits. 3 cm hypodense lesion in the left adrenal gland is noted likely representing an adenoma. Kidneys are well visualize without evidence of renal calculi or obstructive changes. Mild perinephric stranding is noted of uncertain significance. Ureters are within normal limits. Bladder is partially distended. Stomach/Bowel: The appendix is well visualized and within normal limits. No obstructive or inflammatory changes of the colon are seen. Small bowel and stomach are unremarkable. Vascular/Lymphatic: No significant vascular findings are present. No enlarged abdominal or pelvic lymph nodes. Reproductive: Prostate is unremarkable. Calcification of the seminal vesicles and vas deferens is noted. Other: No abdominal wall hernia or abnormality. No abdominopelvic ascites. Musculoskeletal: Degenerative changes of lumbar spine are noted. IMPRESSION: No acute abnormality is noted within the abdomen. 3 cm hypodense lesion in the left adrenal gland likely representing an adenoma. No other focal abnormality is noted. Electronically Signed   By: Inez Catalina M.D.   On: 07/06/2020 23:28   DG Chest 2 View  Result Date: 07/06/2020 CLINICAL DATA:  Chest pain EXAM: CHEST - 2 VIEW COMPARISON:  07/22/2019 FINDINGS: The heart size and mediastinal  contours are within normal limits. Both lungs are clear. The visualized skeletal structures are unremarkable. IMPRESSION: No active cardiopulmonary disease. Electronically Signed   By: Fidela Salisbury MD   On: 07/06/2020 20:06    Procedures Procedures   Medications Ordered in ED Medications  acetaminophen (TYLENOL) tablet 650 mg (has no administration in time range)  ondansetron (ZOFRAN) injection 4 mg (has no administration in time range)  enoxaparin (LOVENOX) injection 30 mg (has no administration in time range)  ondansetron (ZOFRAN) injection 4 mg (4 mg Intravenous Given 07/06/20 2227)  fentaNYL (SUBLIMAZE) injection 50 mcg (50 mcg Intravenous Given 07/06/20 2250)    ED Course  I have reviewed the triage vital signs and the nursing notes.  Pertinent labs & imaging results that were available during my care of the patient were reviewed by me and considered in my medical decision making (see chart for details).     MDM Rules/Calculators/A&P                          Patient with history and presentation as above.  He is afebrile here, hypertensive.  On examination his abdomen is soft with generalized tenderness, no peritoneal signs.  He does have peripheral edema.  His blood work was initiated in triage and reveals mildly low hemoglobin at 10.4.  Troponins are elevated to 40 though are flat and consistent with previous, this is likely his baseline.  His BNP is also elevated at 570.  Metabolic panel shows elevation in creatinine 2.6.  It is unclear of patient's baseline, 1 year ago was 1.85.  Chest x-ray without large effusion.  CT imaging of his abdomen is negative for acute findings.  COVID swab was also ordered due to viral-like symptoms.  There is concern for patient's chest pain with peripheral edema, he  does not appear to be prescribed diuretic on his med list.  Was unable to obtain outside records at the time of patient's work-up and disposition.  He however has no follow-up outpatient  and would benefit from monitoring of kidney function, potential diuresis, potential cardiac work-up.  Consulted with hospitalist, Dr. Alcario Drought is accepting admission for further management.   Final Clinical Impression(s) / ED Diagnoses Final diagnoses:  Chest pain, unspecified type  Peripheral edema  Elevated serum creatinine    Rx / DC Orders ED Discharge Orders     None        Robinson, [place] N, PA-C 07/07/20 4401    Varney Biles, MD 07/08/20 1504

## 2020-07-07 NOTE — Progress Notes (Signed)
Received pt on stretcher, accompanied  by ER RN, pt aox4, able to transfer from stretcher to bed with FWW, 1 person assist, pt calm and cooperative, pt c/o of generalized pain, no s/s of respiratory distress,scheduled meds given and will continue plan of care.

## 2020-07-07 NOTE — TOC Progression Note (Signed)
Transition of Care [facility]) - Progression Note    Patient Details  Name: Ryan Jenkins MRN: [id number] Date of Birth: 1960-03-01  [facility] CM/SW Contact  Purcell Mouton, RN Phone Number: 07/07/2020, 4:00 PM  Clinical Narrative:     Pt asked for [facility] and shower stool. Ordered from [employer].   Expected Discharge Plan: Home/Self Care Barriers to Discharge: No Barriers Identified  Expected Discharge Plan and Services Expected Discharge Plan: Home/Self Care       Living arrangements for the past 2 months: Single Family Home                                       Social Determinants of Health (SDOH) Interventions    Readmission Risk Interventions No flowsheet data found.

## 2020-07-08 DIAGNOSIS — R079 Chest pain, unspecified: Secondary | ICD-10-CM | POA: Diagnosis not present

## 2020-07-08 LAB — CBC WITH DIFFERENTIAL/PLATELET
Abs Immature Granulocytes: 0.03 10*3/uL (ref 0.00–0.07)
Basophils Absolute: 0.1 10*3/uL (ref 0.0–0.1)
Basophils Relative: 1 %
Eosinophils Absolute: 0.2 10*3/uL (ref 0.0–0.5)
Eosinophils Relative: 2 %
HCT: 28.7 % — ABNORMAL LOW (ref 39.0–52.0)
Hemoglobin: 9.5 g/dL — ABNORMAL LOW (ref 13.0–17.0)
Immature Granulocytes: 0 %
Lymphocytes Relative: 27 %
Lymphs Abs: 2.1 10*3/uL (ref 0.7–4.0)
MCH: 29.9 pg (ref 26.0–34.0)
MCHC: 33.1 g/dL (ref 30.0–36.0)
MCV: 90.3 fL (ref 80.0–100.0)
Monocytes Absolute: 0.7 10*3/uL (ref 0.1–1.0)
Monocytes Relative: 9 %
Neutro Abs: 4.6 10*3/uL (ref 1.7–7.7)
Neutrophils Relative %: 61 %
Platelets: 250 10*3/uL (ref 150–400)
RBC: 3.18 MIL/uL — ABNORMAL LOW (ref 4.22–5.81)
RDW: 14.3 % (ref 11.5–15.5)
WBC: 7.7 10*3/uL (ref 4.0–10.5)
nRBC: 0 % (ref 0.0–0.2)

## 2020-07-08 LAB — BASIC METABOLIC PANEL
Anion gap: 6 (ref 5–15)
BUN: 25 mg/dL — ABNORMAL HIGH (ref 6–20)
CO2: 25 mmol/L (ref 22–32)
Calcium: 8.4 mg/dL — ABNORMAL LOW (ref 8.9–10.3)
Chloride: 104 mmol/L (ref 98–111)
Creatinine, Ser: 2.25 mg/dL — ABNORMAL HIGH (ref 0.61–1.24)
GFR, Estimated: 33 mL/min — ABNORMAL LOW (ref >60)
Glucose, Bld: 150 mg/dL — ABNORMAL HIGH (ref 70–99)
Potassium: 3.7 mmol/L (ref 3.5–5.1)
Sodium: 135 mmol/L (ref 135–145)

## 2020-07-08 LAB — GLUCOSE, CAPILLARY
Glucose-Capillary: 102 mg/dL — ABNORMAL HIGH (ref 70–99)
Glucose-Capillary: 137 mg/dL — ABNORMAL HIGH (ref 70–99)
Glucose-Capillary: 157 mg/dL — ABNORMAL HIGH (ref 70–99)
Glucose-Capillary: 154 mg/dL — ABNORMAL HIGH (ref 70–99)

## 2020-07-08 LAB — BRAIN NATRIURETIC PEPTIDE: B Natriuretic Peptide: 284.2 pg/mL — ABNORMAL HIGH (ref 0.0–100.0)

## 2020-07-08 MED ORDER — DOCUSATE SODIUM 100 MG PO CAPS
100.0000 mg | ORAL_CAPSULE | Freq: Two times a day (BID) | ORAL | Status: DC
  Administered 2020-07-08 – 2020-07-09 (×3): 100 mg via ORAL
  Filled 2020-07-08 (×3): qty 1

## 2020-07-08 MED ORDER — ENOXAPARIN SODIUM 40 MG/0.4ML IJ SOSY: 40.0000 mg | PREFILLED_SYRINGE | INTRAMUSCULAR | Status: DC

## 2020-07-08 MED ORDER — PANTOPRAZOLE SODIUM 40 MG PO TBEC
40.0000 mg | DELAYED_RELEASE_TABLET | Freq: Two times a day (BID) | ORAL | Status: DC
  Administered 2020-07-08 – 2020-07-09 (×2): 40 mg via ORAL
  Filled 2020-07-08 (×2): qty 1

## 2020-07-08 MED ORDER — POLYETHYLENE GLYCOL 3350 17 G PO PACK
17.0000 g | PACK | Freq: Every day | ORAL | Status: DC
  Administered 2020-07-08 – 2020-07-09 (×2): 17 g via ORAL
  Filled 2020-07-08 (×2): qty 1

## 2020-07-08 NOTE — Progress Notes (Signed)
PROGRESS NOTE    Ryan Jenkins  NIO:[id number] DOB: 23-Dec-1960 DOA: 07/06/2020 PCP: [name], No   Brief Narrative:  Ryan Jenkins is a 60 y.o. male with medical history significant of DM2 with neuropathy, HTN, CKD 4, suspected non-adherence to medication / diet regimen presented to ED for complaint of chest pain since about 1 to 2 days along with mild pedal edema bilateral lower extremities.  No other complaint.   Last year around this time pt was admitted for HTN urgency, uncontrolled DM2 with A1C of 13.3%. Pt then moved out of state to [place].  Pt was admitted to hospital in [place] from but ended up with having acute kidney failure, had respiratory failure due to pulmonary edema requiring intubation.  Ultimately spent several days getting dialysis before having renal recovery.  LHC at that admission = 60% stenosis of RCA nothing stent able; TEE = preserved EF, "unremarkable" according to hospitalist..  Since then baseline creat seems to be ~2.5 or so. Did have urinary retention that was managed with foley for the next couple of months, foley ultimately removed.   Pt had been in SNF up until ~2 weeks ago.  Looks like DM2 and HTN had been fairly well controlled in SNF with 2 most recent A1Cs of 5.x and 7.0 most recently just 1 month ago (per PCP note).  BP on that PCP visit was 126/62.   Assessment & Plan:   Principal Problem:   Chest pain Active Problems:   Diabetic neuropathy, type II diabetes mellitus (HCC)   Essential hypertension   History of medication noncompliance   CKD (chronic kidney disease) stage 4, GFR 15-29 ml/min (HCC)   Acute on chronic diastolic CHF (congestive heart failure) (HCC)   Chest pain: Troponins around 40x2 but transthoracic echo negative for any wall motion abnormality and has normal ejection fraction with grade 1 diastolic dysfunction.  He is pointing towards upper abdomen, likely has gastritis.  Upper abdominal pain/presumed gastritis: CT abdomen  and pelvis negative for acute pathology.  He complains burning pain.  Likely has gastritis.  We will start him on p.o. omeprazole twice daily.  Bilateral knee arthritis/CPPD/joint effusion: Consulted [facility] yesterday around 4 PM.  Patient was seen this morning by PA, who had recommended consulting IR to do arthrocentesis however IR was not consulted and I was not also informed about their recommendations.  Just came to know about their recommendations while I was reviewing patient's chart.  I have consulted IR.  Ortho PA is not available on secure chat and there is no page number to call her either.  Type 2 diabetes mellitus: Blood sugar controlled, continue Farxiga, Lantus 10 units and SSI.  Essential hypertension: Blood pressure now better controlled.  Continue current regimen which includes clonidine, Toprol-XL, hydralazine, chlorthalidone and amlodipine.  BPH: Continue Flomax.  Anxiety: Continue benzodiazepines.  Hyperlipidemia: Continue Crestor.  CKD stage IIIb/IV: Creatinine has been hovering between those ranges.  Stable.  Monitor.  DVT prophylaxis: enoxaparin (LOVENOX) injection 40 mg Start: 07/09/20 1000   Code Status: Full Code  Family Communication: None present at bedside.  Plan of care discussed with patient in length and he verbalized understanding and agreed with it.  Status is: Observation  The patient will require care spanning > 2 midnights and should be moved to inpatient because: Inpatient level of care appropriate due to severity of illness  Dispo: The patient is from: Home              Anticipated  d/c is to: Home              Patient currently is not medically stable to d/c.   Difficult to place patient No        Estimated body mass index is 26.35 kg/m as calculated from the following:   Height as of this encounter: 6\' 2"  (1.88 m).   Weight as of this encounter: 93.1 kg.      Nutritional status:  Nutrition Problem: Increased nutrient  needs Etiology: acute illness   Signs/Symptoms: estimated needs   Interventions: Glucerna shake, MVI    Consultants:  Orthopedics IR  Procedures:  None  Antimicrobials:  Anti-infectives (From admission, onward)    None          Subjective: Seen and examined and still complains of epigastric burning pain.  Some pain in the left knee and a little bit on the right knee as well.  No new complaint.  Overall feels better than yesterday.  Objective: Vitals:   07/07/20 2102 07/07/20 2333 07/08/20 0504 07/08/20 1125  BP: (!) 144/78 (!) 164/79 (!) 172/83 130/69  Pulse: 71 76 79 64  Resp: 18 18 18    Temp: 98.2 F (36.8 C) 98.4 F (36.9 C) 98.1 F (36.7 C)   TempSrc: Oral Oral Oral   SpO2: 97% 99% 99%   Weight:      Height:        Intake/Output Summary (Last 24 hours) at 07/08/2020 1324 Last data filed at 07/08/2020 1230 Gross per 24 hour  Intake 360 ml  Output 1250 ml  Net -890 ml   Filed Weights   07/07/20 0350  Weight: 93.1 kg    Examination:  General exam: Appears calm and comfortable  Respiratory system: Clear to auscultation. Respiratory effort normal. Cardiovascular system: S1 & S2 heard, RRR. No JVD, murmurs, rubs, gallops or clicks.  Trace pitting edema bilateral lower extremity Gastrointestinal system: Abdomen is nondistended, soft and nontender. No organomegaly or masses felt. Normal bowel sounds heard. Central nervous system: Alert and oriented. No focal neurological deficits. Extremities: Symmetric 5 x 5 power.  Palpable effusion bilateral knees, left greater than the right. Skin: No rashes, lesions or ulcers.  Psychiatry: Judgement and insight appear normal. Mood & affect appropriate.    Data Reviewed: I have personally reviewed following labs and imaging studies  CBC: Recent Labs  Lab 07/06/20 1901 07/07/20 1004 07/08/20 0938  WBC 8.0 7.7 7.7  NEUTROABS  --  5.1 4.6  HGB 10.4* 9.4* 9.5*  HCT 31.9* 28.3* 28.7*  MCV 90.4 90.1 90.3   PLT 285 247 952   Basic Metabolic Panel: Recent Labs  Lab 07/06/20 1901 07/07/20 1004 07/08/20 0938  NA 135 135 135  K 3.4* 3.2* 3.7  CL 104 104 104  CO2 24 25 25   GLUCOSE 324* 196* 150*  BUN 31* 25* 25*  CREATININE 2.58* 2.33* 2.25*  CALCIUM 8.7* 8.2* 8.4*   GFR: Estimated Creatinine Clearance: 40.6 mL/min (A) (by C-G formula based on SCr of 2.25 mg/dL (H)). Liver Function Tests: Recent Labs  Lab 07/06/20 1901  AST 25  ALT 21  ALKPHOS 71  BILITOT 0.6  PROT 7.1  ALBUMIN 3.5   Recent Labs  Lab 07/06/20 1901  LIPASE 27   No results for input(s): AMMONIA in the last 168 hours. Coagulation Profile: No results for input(s): INR, PROTIME in the last 168 hours. Cardiac Enzymes: No results for input(s): CKTOTAL, CKMB, CKMBINDEX, TROPONINI in the last 168 hours. BNP (last  3 results) No results for input(s): PROBNP in the last 8760 hours. HbA1C: Recent Labs    07/07/20 0036  HGBA1C 7.8*   CBG: Recent Labs  Lab 07/07/20 1118 07/07/20 1646 07/07/20 2032 07/08/20 0842 07/08/20 1228  GLUCAP 159* 128* 134* 102* 137*   Lipid Profile: No results for input(s): CHOL, HDL, LDLCALC, TRIG, CHOLHDL, LDLDIRECT in the last 72 hours. Thyroid Function Tests: No results for input(s): TSH, T4TOTAL, FREET4, T3FREE, THYROIDAB in the last 72 hours. Anemia Panel: No results for input(s): VITAMINB12, FOLATE, FERRITIN, TIBC, IRON, RETICCTPCT in the last 72 hours. Sepsis Labs: No results for input(s): PROCALCITON, LATICACIDVEN in the last 168 hours.  Recent Results (from the past 240 hour(s))  Resp Panel by RT-PCR (Flu A&B, Covid) Nasopharyngeal Swab     Status: None   Collection Time: 07/06/20 10:03 PM   Specimen: Nasopharyngeal Swab; Nasopharyngeal(NP) swabs in vial transport medium  Result Value Ref Range Status   SARS Coronavirus 2 by RT PCR NEGATIVE NEGATIVE Final    Comment: (NOTE) SARS-CoV-2 target nucleic acids are NOT DETECTED.  The SARS-CoV-2 RNA is generally  detectable in upper respiratory specimens during the acute phase of infection. The lowest concentration of SARS-CoV-2 viral copies this assay can detect is 138 copies/mL. A negative result does not preclude SARS-Cov-2 infection and should not be used as the sole basis for treatment or other patient management decisions. A negative result may occur with  improper specimen collection/handling, submission of specimen other than nasopharyngeal swab, presence of viral mutation(s) within the areas targeted by this assay, and inadequate number of viral copies(<138 copies/mL). A negative result must be combined with clinical observations, patient history, and epidemiological information. The expected result is Negative.  Fact Sheet for Patients:  EntrepreneurPulse.com.au  Fact Sheet for Healthcare Providers:  IncredibleEmployment.be  This test is no t yet approved or cleared by the [place] FDA and  has been authorized for detection and/or diagnosis of SARS-CoV-2 by FDA under an Emergency Use Authorization (EUA). This EUA will remain  in effect (meaning this test can be used) for the duration of the COVID-19 declaration under Section 564(b)(1) of the Act, 21 U.S.C.section 360bbb-3(b)(1), unless the authorization is terminated  or revoked sooner.       Influenza A by PCR NEGATIVE NEGATIVE Final   Influenza B by PCR NEGATIVE NEGATIVE Final    Comment: (NOTE) The Xpert Xpress SARS-CoV-2/FLU/RSV plus assay is intended as an aid in the diagnosis of influenza from Nasopharyngeal swab specimens and should not be used as a sole basis for treatment. Nasal washings and aspirates are unacceptable for Xpert Xpress SARS-CoV-2/FLU/RSV testing.  Fact Sheet for Patients: EntrepreneurPulse.com.au  Fact Sheet for Healthcare Providers: IncredibleEmployment.be  This test is not yet approved or cleared by the [place] FDA  and has been authorized for detection and/or diagnosis of SARS-CoV-2 by FDA under an Emergency Use Authorization (EUA). This EUA will remain in effect (meaning this test can be used) for the duration of the COVID-19 declaration under Section 564(b)(1) of the Act, 21 U.S.C. section 360bbb-3(b)(1), unless the authorization is terminated or revoked.  Performed at [facility], [place] [address] [place], [place] [address]       Radiology Studies: CT Abdomen Pelvis Wo Contrast  Result Date: 07/06/2020 CLINICAL DATA:  Chest and abdominal pain EXAM: CT ABDOMEN AND PELVIS WITHOUT CONTRAST TECHNIQUE: Multidetector CT imaging of the abdomen and pelvis was performed following the standard protocol without IV contrast. COMPARISON:  None. FINDINGS: Lower  chest: No acute abnormality. Hepatobiliary: No focal liver abnormality is seen. No gallstones, gallbladder wall thickening, or biliary dilatation. Pancreas: Unremarkable. No pancreatic ductal dilatation or surrounding inflammatory changes. Spleen: Normal in size without focal abnormality. Adrenals/Urinary Tract: Right adrenal gland is within normal limits. 3 cm hypodense lesion in the left adrenal gland is noted likely representing an adenoma. Kidneys are well visualize without evidence of renal calculi or obstructive changes. Mild perinephric stranding is noted of uncertain significance. Ureters are within normal limits. Bladder is partially distended. Stomach/Bowel: The appendix is well visualized and within normal limits. No obstructive or inflammatory changes of the colon are seen. Small bowel and stomach are unremarkable. Vascular/Lymphatic: No significant vascular findings are present. No enlarged abdominal or pelvic lymph nodes. Reproductive: Prostate is unremarkable. Calcification of the seminal vesicles and vas deferens is noted. Other: No abdominal wall hernia or abnormality. No abdominopelvic ascites. Musculoskeletal: Degenerative  changes of lumbar spine are noted. IMPRESSION: No acute abnormality is noted within the abdomen. 3 cm hypodense lesion in the left adrenal gland likely representing an adenoma. No other focal abnormality is noted. Electronically Signed   By: Inez Catalina M.D.   On: 07/06/2020 23:28   DG Chest 2 View  Result Date: 07/06/2020 CLINICAL DATA:  Chest pain EXAM: CHEST - 2 VIEW COMPARISON:  07/22/2019 FINDINGS: The heart size and mediastinal contours are within normal limits. Both lungs are clear. The visualized skeletal structures are unremarkable. IMPRESSION: No active cardiopulmonary disease. Electronically Signed   By: Fidela Salisbury MD   On: 07/06/2020 20:06   DG THORACOLUMABAR SPINE  Result Date: 07/07/2020 CLINICAL DATA:  Patient fell 3 days ago EXAM: THORACOLUMBAR SPINE 2V COMPARISON:  CT abdomen pelvis 07/06/2020 FINDINGS: There is no evidence of fracture of the lower thoracic and lumbar. Preserved disc heights with peripherally calcified posterior bulging discs from L3-S1. Tubular calcifications in the pelvis. IMPRESSION: No evidence of fracture in the visualized lower thoracic and lumbar spine. Electronically Signed   By: Maurine Simmering   On: 07/07/2020 11:26   CT KNEE LEFT WO CONTRAST  Result Date: 07/07/2020 CLINICAL DATA:  Limping, acute, knee pain (Ped 0-5y) EXAM: CT OF THE left KNEE WITHOUT CONTRAST TECHNIQUE: Multidetector CT imaging of the left knee was performed according to the standard protocol. Multiplanar CT image reconstructions were also generated. COMPARISON:  None. FINDINGS: Bones/Joint/Cartilage There is no evidence of fracture. There is tricompartment osteophyte formation. There is subchondral sclerosis and cystic change. There is a broad area of articular surface irregularity with likely osteochondral defects along the weight-bearing medial femoral condyle (sagittal image 31). Similar finding along the lateral patellar facet (axial images 53-57). There is a deep popliteal groove  (coronal image 54). There is chondrocalcinosis of the medial and lateral menisci and along the ACL. There is a moderate-sized joint effusion. There is a moderate size Baker cyst. Ligaments Suboptimally assessed by CT. Muscles and Tendons No significant muscle atrophy. Soft tissues Vascular calcifications. IMPRESSION: Tricompartment arthritis of the right knee, with articular surface irregularity along the weight-bearing medial femoral condyle and lateral patellar facet. Moderate-sized joint effusion and Baker cyst. Chondrocalcinosis of the medial and lateral menisci and deep popliteal groove. Findings can be seen with degenerative joint disease and/or CPPD arthropathy. Electronically Signed   By: Maurine Simmering   On: 07/07/2020 11:35   CT KNEE RIGHT WO CONTRAST  Result Date: 07/07/2020 CLINICAL DATA:  Limping, acute, knee pain (Ped 0-5y) EXAM: CT OF THE right KNEE WITHOUT CONTRAST TECHNIQUE: Multidetector CT imaging  of the right knee was performed according to the standard protocol. Multiplanar CT image reconstructions were also generated. COMPARISON:  None. FINDINGS: Bones/Joint/Cartilage There is no evidence of acute fracture. Minimal tricompartment osteophyte formation. There are multiple 4-5 mm osteochondral defect or erosive change along the lateral patellar facet, medial trochlea, and anterior weight-bearing medial femoral condyle (axial images 51-54, 71, and coronal image 42 respectively). Additional possible erosive change along the medial upper margin of the joint along the peripheral medial femoral condyle, with well corticated margins (coronal image 61). There is chondrocalcinosis involving both menisci. There is mild medial joint space narrowing. There is a moderate size joint effusion. Ligaments Suboptimally assessed by CT. Muscles and Tendons No muscle atrophy. Soft tissues Vascular calcifications. IMPRESSION: Tricompartment arthritis of the right knee with erosive change versus multiple osteochondral  defects as described above. Moderate-sized joint effusion. Findings can be seen with degenerative joint disease and/or CPPD arthropathy. Rheumatoid arthritis is also in the differential. Electronically Signed   By: Maurine Simmering   On: 07/07/2020 11:22   ECHOCARDIOGRAM COMPLETE  Result Date: 07/07/2020    ECHOCARDIOGRAM REPORT   Patient Name:   CONCEPCION KIRKPATRICK Date of Exam: 07/07/2020 Medical Rec #:  [id number]         Height:       74.0 in Accession #:    [id number]        Weight:       205.2 lb Date of Birth:  1960/08/07         BSA:          2.198 m Patient Age:    17 years          BP:           172/88 mmHg Patient Gender: M                 HR:           90 bpm. Exam Location:  Inpatient Procedure: 2D Echo, Cardiac Doppler and Color Doppler Indications:    CHF, chest pain  History:        Patient has prior history of Echocardiogram examinations, most                 recent 07/23/2019. Signs/Symptoms:CKD and Chest Pain; Risk                 Factors:Diabetes and Hypertension.  Sonographer:    Dustin Flock Referring Phys: [place]  1. Left ventricular ejection fraction, by estimation, is 60 to 65%. The left ventricle has normal function. The left ventricle has no regional wall motion abnormalities. There is severe left ventricular hypertrophy. Left ventricular diastolic parameters  are consistent with Grade I diastolic dysfunction (impaired relaxation). Elevated left atrial pressure.  2. Right ventricular systolic function is normal. The right ventricular size is normal.  3. Left atrial size was mildly dilated.  4. The mitral valve is normal in structure. Trivial mitral valve regurgitation. No evidence of mitral stenosis.  5. The aortic valve is tricuspid. Aortic valve regurgitation is not visualized. Mild aortic valve sclerosis is present, with no evidence of aortic valve stenosis.  6. The inferior vena cava is normal in size with greater than 50% respiratory variability, suggesting  right atrial pressure of 3 mmHg. FINDINGS  Left Ventricle: Left ventricular ejection fraction, by estimation, is 60 to 65%. The left ventricle has normal function. The left ventricle has no regional wall motion abnormalities. The left ventricular internal  cavity size was normal in size. There is  severe left ventricular hypertrophy. Left ventricular diastolic parameters are consistent with Grade I diastolic dysfunction (impaired relaxation). Elevated left atrial pressure. Right Ventricle: The right ventricular size is normal. Right ventricular systolic function is normal. Left Atrium: Left atrial size was mildly dilated. Right Atrium: Right atrial size was normal in size. Pericardium: Trivial pericardial effusion is present. Mitral Valve: The mitral valve is normal in structure. Trivial mitral valve regurgitation. No evidence of mitral valve stenosis. Tricuspid Valve: The tricuspid valve is normal in structure. Tricuspid valve regurgitation is not demonstrated. No evidence of tricuspid stenosis. Aortic Valve: The aortic valve is tricuspid. Aortic valve regurgitation is not visualized. Mild aortic valve sclerosis is present, with no evidence of aortic valve stenosis. Pulmonic Valve: The pulmonic valve was normal in structure. Pulmonic valve regurgitation is not visualized. No evidence of pulmonic stenosis. Aorta: The aortic root is normal in size and structure. Venous: The inferior vena cava is normal in size with greater than 50% respiratory variability, suggesting right atrial pressure of 3 mmHg. IAS/Shunts: No atrial level shunt detected by color flow Doppler.  LEFT VENTRICLE PLAX 2D LVIDd:         4.00 cm  Diastology LVIDs:         2.90 cm  LV e' medial:    6.09 cm/s LV PW:         1.50 cm  LV E/e' medial:  16.4 LV IVS:        1.50 cm  LV e' lateral:   6.31 cm/s LVOT diam:     2.20 cm  LV E/e' lateral: 15.8 LV SV:         73 LV SV Index:   33 LVOT Area:     3.80 cm  RIGHT VENTRICLE RV Basal diam:  3.80 cm RV S  prime:     11.20 cm/s TAPSE (M-mode): 1.8 cm LEFT ATRIUM             Index       RIGHT ATRIUM           Index LA diam:        4.00 cm 1.82 cm/m  RA Area:     13.70 cm LA Vol (A2C):   77.3 ml 35.18 ml/m RA Volume:   31.80 ml  14.47 ml/m LA Vol (A4C):   73.0 ml 33.22 ml/m LA Biplane Vol: 76.4 ml 34.77 ml/m  AORTIC VALVE LVOT Vmax:   104.00 cm/s LVOT Vmean:  66.400 cm/s LVOT VTI:    0.193 m  AORTA Ao Root diam: 3.30 cm MITRAL VALVE MV Area (PHT): 11.67 cm    SHUNTS MV Decel Time: 65 msec      Systemic VTI:  0.19 m MV E velocity: 100.00 cm/s  Systemic Diam: 2.20 cm MV A velocity: 107.00 cm/s MV E/A ratio:  0.93 Kirk Ruths MD Electronically signed by Kirk Ruths MD Signature Date/Time: 07/07/2020/11:37:43 AM    Final     Scheduled Meds:  amLODipine  10 mg Oral Daily   aspirin EC  81 mg Oral Daily   chlorthalidone  12.5 mg Oral QODAY   cloNIDine  0.1 mg Oral TID   dapagliflozin propanediol  5 mg Oral Daily   docusate sodium  100 mg Oral BID   [START ON 07/09/2020] enoxaparin (LOVENOX) injection  40 mg Subcutaneous Q24H   escitalopram  20 mg Oral Daily   feeding supplement (GLUCERNA SHAKE)  237 mL Oral TID BM  finasteride  5 mg Oral Daily   hydrALAZINE  125 mg Oral Q8H   insulin aspart  0-5 Units Subcutaneous QHS   insulin aspart  0-9 Units Subcutaneous TID WC   insulin glargine  10 Units Subcutaneous QHS   metoprolol succinate  150 mg Oral Daily   multivitamin  1 tablet Oral QHS   nortriptyline  25 mg Oral QHS   pantoprazole  40 mg Oral BID AC   polyethylene glycol  17 g Oral Daily   rosuvastatin  20 mg Oral Daily   tamsulosin  0.4 mg Oral Daily   traZODone  100 mg Oral QHS   Continuous Infusions:   LOS: 0 days   Time spent: 35 minutes   Darliss Cheney, MD [facility]  07/08/2020, 1:24 PM   How to contact the [facility] Attending or Consulting provider [place] or covering provider during after hours [place], for this patient?  Check the care team in [facility] and look for a)  attending/consulting [facility] provider listed and b) the [facility] team listed. Page or secure chat [phone]. Log into [email or web address] and use [place]'s universal password to access. If you do not have the password, please contact the hospital operator. Locate the [facility] provider you are looking for under [facility] and page to a number that you can be directly reached. If you still have difficulty reaching the provider, please page the [facility] (Director on Call) for the Hospitalists listed on amion for assistance.

## 2020-07-08 NOTE — Evaluation (Signed)
Physical Therapy Evaluation Patient Details Name: Ryan Jenkins MRN: [id number] DOB: [birth year] Today's Date: 07/08/2020   History of Present Illness  pt is a 60 y.o. male who presented toED with complaints of chest pain and peripheral edema.  PMH significant for DMII with neuropathy, HTN, CKD 4, GERD, and CHF.  Clinical Impression  Pt is a 60 y.o. male with above listed HPI. Pt required MIN guard for safety with sit to stand transfer and was able to ambulate with supervision a total of 27ft with 1 standing rest break 2/2 B knee pain, no LOB observed. Pt is safe to mobilize with nursing staff outside of therapy sessions. Pt lives with his brother and reports that he can obtain supervision from other family/friends if needed upon discharge. Recommend home with family and HHPT. Pt will benefit from continued skilled physical therapy in order to address listed impairments and maximize functional mobility.     Follow Up Recommendations Home health PT    Equipment Recommendations  None recommended by PT    Recommendations for Other Services       Precautions / Restrictions Precautions Precautions: Fall Restrictions Weight Bearing Restrictions: No      Mobility  Bed Mobility Overal bed mobility: Needs Assistance Bed Mobility: Supine to Sit     Supine to sit: Independent     General bed mobility comments: HOB elevated, pt able to sit EOB safely independently    Transfers Overall transfer level: Needs assistance Equipment used: Rolling walker (2 wheeled) Transfers: Sit to/from Stand Sit to Stand: Min guard         General transfer comment: Min guard for safety, pt demonstrated safe hand placement for transfer  Ambulation/Gait Ambulation/Gait assistance: Supervision Gait Distance (Feet): 250 Feet Assistive device: Rolling walker (2 wheeled) Gait Pattern/deviations: Step-through pattern;Decreased stride length Gait velocity: decr   General Gait Details: Pt able to  ambulate a total of 2109ft with supervision and use of RW, no LOB observed. Pt required x1 standing rest break 2/2 B knee pain.  Stairs            Wheelchair Mobility    Modified Rankin (Stroke Patients Only)       Balance Overall balance assessment: Needs assistance Sitting-balance support: Feet unsupported;Single extremity supported Sitting balance-Leahy Scale: Good     Standing balance support: Bilateral upper extremity supported Standing balance-Leahy Scale: Poor Standing balance comment: use of RW for standing balance                             Pertinent Vitals/Pain Pain Assessment: 0-10 Pain Location: "all over" Pain Intervention(s): Limited activity within patient's tolerance;Monitored during session;Repositioned    Home Living Family/patient expects to be discharged to:: Private residence Living Arrangements: Other relatives Available Help at Discharge: Family (moved in with brother) Type of Home: Apartment Home Access: Stairs to enter   Technical brewer of Steps: 15 Home Layout: One level Home Equipment: Environmental consultant - 2 wheels;Shower seat;Bedside commode Additional Comments: d/c from SNF on Monday. Did not need assist with ADLs.    Prior Function Level of Independence: Independent with assistive device(s)         Comments: use of RW since prior hospitalization, was using cane prior.     Hand Dominance   Dominant Hand: Right    Extremity/Trunk Assessment   Upper Extremity Assessment Upper Extremity Assessment: RUE deficits/detail;LUE deficits/detail RUE Sensation: decreased light touch (fingertips) LUE Sensation: decreased light touch (at  fingertips)    Lower Extremity Assessment Lower Extremity Assessment: RLE deficits/detail;LLE deficits/detail RLE Sensation: decreased light touch (plantar surface 2/2 neuropathy) LLE Sensation: decreased light touch (plantar surface 2/2 neuropathy)    Cervical / Trunk Assessment Cervical /  Trunk Assessment: Normal  Communication   Communication: No difficulties  Cognition Arousal/Alertness: Awake/alert Behavior During Therapy: WFL for tasks assessed/performed Overall Cognitive Status: Within Functional Limits for tasks assessed                                        General Comments General comments (skin integrity, edema, etc.): edema noted in both knees, L>R    Exercises     Assessment/Plan    PT Assessment Patient needs continued PT services  PT Problem List Decreased strength;Decreased activity tolerance;Decreased balance;Decreased mobility;Pain       PT Treatment Interventions DME instruction;Gait training;Stair training;Functional mobility training;Therapeutic activities;Therapeutic exercise;Balance training;Patient/family education    PT Goals (Current goals can be found in the Care Plan section)  Acute Rehab PT Goals Patient Stated Goal: wean from RW and get back to use of cane PT Goal Formulation: With patient Time For Goal Achievement: 07/22/20 Potential to Achieve Goals: Good    Frequency Min 3X/week   Barriers to discharge        Co-evaluation               AM-PAC PT "6 Clicks" Mobility  Outcome Measure Help needed turning from your back to your side while in a flat bed without using bedrails?: None Help needed moving from lying on your back to sitting on the side of a flat bed without using bedrails?: None Help needed moving to and from a bed to a chair (including a wheelchair)?: A Little Help needed standing up from a chair using your arms (e.g., wheelchair or bedside chair)?: A Little Help needed to walk in hospital room?: A Little Help needed climbing 3-5 steps with a railing? : A Little 6 Click Score: 20    End of Session Equipment Utilized During Treatment: Gait belt Activity Tolerance: Patient tolerated treatment well Patient left: in chair;with call bell/phone within reach;with chair alarm set Nurse  Communication: Mobility status PT Visit Diagnosis: Unsteadiness on feet (R26.81);Pain;Muscle weakness (generalized) (M62.81) Pain - Right/Left:  (Bilateral) Pain - part of body: Knee    Time: 8250-5397 PT Time Calculation (min) (ACUTE ONLY): 21 min   Charges:   PT Evaluation $PT Eval Low Complexity: 1 Low          Festus Barren PT, DPT  Acute Rehabilitation Services  Office [phone]    07/08/2020, 11:31 AM

## 2020-07-08 NOTE — Consult Note (Addendum)
Patient ID: Ryan Jenkins MRN: [id number] DOB/AGE: 60-20-1962 60 y.o.  Admit date: 07/06/2020  Admission Diagnoses:  Principal Problem:   Chest pain Active Problems:   Diabetic neuropathy, type II diabetes mellitus ([place])   Essential hypertension   History of medication noncompliance   CKD (chronic kidney disease) stage 4, GFR 15-29 ml/min (HCC)   Acute on chronic diastolic CHF (congestive heart failure) (HCC)   HPI: Ryan Jenkins is a 60 y.o. male with medical history significant of DM2 with neuropathy, HTN, CKD 4, suspected non-adherence to medication / diet regimen.Patient was recently released from SNF (2 weeks ago) and presented to the ED for CP.   Orthopedics was asked to consult in regards to bilateral knee Effusions. He states he has intermittent episodes of knee effusions over the last 8 months. This particular episode is ongoing since Monday when he got off a plane. He states the left knee is worse than the right. He has pain with ROM and weight bearing. He has never had fluid drained from his knee. He denies injury. He states he has decreased LE sensation at baseline due to neuropathy. Denies fevers/sweats/chills.  Past Medical History: Past Medical History:  Diagnosis Date   Abdominal pain 11/25/2018   Acid reflux 11/25/2018   Anxiety    Bilateral swelling of feet and ankles 11/25/2018   and legs   Change in bowel habits 11/25/2018   Constipation 11/25/2018   Depression    Diabetes mellitus without complication ([place])    Diabetic neuropathy ([place])    Diarrhea 11/25/2018   Fatigue 11/25/2018   GERD (gastroesophageal reflux disease)    Hypertension    Loss of appetite 11/25/2018   Muscle spasm 11/25/2018   Neuromuscular disorder ([place])    neuropathy   Retinopathy due to secondary diabetes [facility])    Sleep disturbances 11/25/2018   Vision changes 11/25/2018   Vitamin B12 deficiency     Surgical History: Past Surgical History:  Procedure Laterality  Date   CATARACT EXTRACTION Bilateral 2017    Family History: Family History  Problem Relation Age of Onset   Renal cancer Mother    Hypertension Mother    Pancreatic cancer Mother    Hypertension Sister    Stroke Sister    Leukemia Maternal Uncle    Sickle cell trait Maternal Aunt    Colon cancer Neg Hx    Esophageal cancer Neg Hx    Rectal cancer Neg Hx    Stomach cancer Neg Hx     Social History: Social History   Socioeconomic History   Marital status: Divorced    Spouse name: Not on file   Number of children: 1   Years of education: 12   Highest education level: Not on file  Occupational History   Occupation: unemployed    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, Editor, commissioning  Tobacco Use   Smoking status: Never   Smokeless tobacco: Never  Vaping Use   Vaping Use: Never used  Substance and Sexual Activity   Alcohol use: No    Alcohol/week: 0.0 standard drinks   Drug use: No   Sexual activity: Yes  Other Topics Concern   Not on file  Social History Narrative   Not on file   Social Determinants of Health   Financial Resource Strain: Not on file  Food Insecurity: Not on file  Transportation Needs: Not on file  Physical Activity: Not on file  Stress: Not on file  Social Connections: Not on file  Intimate Partner Violence: Not on file    Allergies: Claritin [loratadine], Hydrochlorothiazide, Latex, Lyrica [pregabalin], and Metformin and related  Medications: I have reviewed the patient's current medications.  Vital Signs: Patient Vitals for the past 24 hrs:  BP Temp Temp src Pulse Resp SpO2  07/08/20 0504 (!) 172/83 98.1 F (36.7 C) Oral 79 18 99 %  07/07/20 2333 (!) 164/79 98.4 F (36.9 C) Oral 76 18 99 %  07/07/20 2102 (!) 144/78 98.2 F (36.8 C) Oral 71 18 97 %  07/07/20 1446 (!) 151/83 97.8 F (36.6 C) Oral 72 18 99 %  07/07/20 1131 (!) 154/82 98.3 F (36.8 C) Oral 71 18 97 %    Radiology: CT Abdomen Pelvis Wo Contrast  Result Date: 07/06/2020 CLINICAL DATA:   Chest and abdominal pain EXAM: CT ABDOMEN AND PELVIS WITHOUT CONTRAST TECHNIQUE: Multidetector CT imaging of the abdomen and pelvis was performed following the standard protocol without IV contrast. COMPARISON:  None. FINDINGS: Lower chest: No acute abnormality. Hepatobiliary: No focal liver abnormality is seen. No gallstones, gallbladder wall thickening, or biliary dilatation. Pancreas: Unremarkable. No pancreatic ductal dilatation or surrounding inflammatory changes. Spleen: Normal in size without focal abnormality. Adrenals/Urinary Tract: Right adrenal gland is within normal limits. 3 cm hypodense lesion in the left adrenal gland is noted likely representing an adenoma. Kidneys are well visualize without evidence of renal calculi or obstructive changes. Mild perinephric stranding is noted of uncertain significance. Ureters are within normal limits. Bladder is partially distended. Stomach/Bowel: The appendix is well visualized and within normal limits. No obstructive or inflammatory changes of the colon are seen. Small bowel and stomach are unremarkable. Vascular/Lymphatic: No significant vascular findings are present. No enlarged abdominal or pelvic lymph nodes. Reproductive: Prostate is unremarkable. Calcification of the seminal vesicles and vas deferens is noted. Other: No abdominal wall hernia or abnormality. No abdominopelvic ascites. Musculoskeletal: Degenerative changes of lumbar spine are noted. IMPRESSION: No acute abnormality is noted within the abdomen. 3 cm hypodense lesion in the left adrenal gland likely representing an adenoma. No other focal abnormality is noted. Electronically Signed   By: Inez Catalina M.D.   On: 07/06/2020 23:28   DG Chest 2 View  Result Date: 07/06/2020 CLINICAL DATA:  Chest pain EXAM: CHEST - 2 VIEW COMPARISON:  07/22/2019 FINDINGS: The heart size and mediastinal contours are within normal limits. Both lungs are clear. The visualized skeletal structures are unremarkable.  IMPRESSION: No active cardiopulmonary disease. Electronically Signed   By: Fidela Salisbury MD   On: 07/06/2020 20:06   DG THORACOLUMABAR SPINE  Result Date: 07/07/2020 CLINICAL DATA:  Patient fell 3 days ago EXAM: THORACOLUMBAR SPINE 2V COMPARISON:  CT abdomen pelvis 07/06/2020 FINDINGS: There is no evidence of fracture of the lower thoracic and lumbar. Preserved disc heights with peripherally calcified posterior bulging discs from L3-S1. Tubular calcifications in the pelvis. IMPRESSION: No evidence of fracture in the visualized lower thoracic and lumbar spine. Electronically Signed   By: Maurine Simmering   On: 07/07/2020 11:26   CT KNEE LEFT WO CONTRAST  Result Date: 07/07/2020 CLINICAL DATA:  Limping, acute, knee pain (Ped 0-5y) EXAM: CT OF THE left KNEE WITHOUT CONTRAST TECHNIQUE: Multidetector CT imaging of the left knee was performed according to the standard protocol. Multiplanar CT image reconstructions were also generated. COMPARISON:  None. FINDINGS: Bones/Joint/Cartilage There is no evidence of fracture. There is tricompartment osteophyte formation. There is subchondral sclerosis and cystic change. There is a broad area of articular surface irregularity with  likely osteochondral defects along the weight-bearing medial femoral condyle (sagittal image 31). Similar finding along the lateral patellar facet (axial images 53-57). There is a deep popliteal groove (coronal image 54). There is chondrocalcinosis of the medial and lateral menisci and along the ACL. There is a moderate-sized joint effusion. There is a moderate size Baker cyst. Ligaments Suboptimally assessed by CT. Muscles and Tendons No significant muscle atrophy. Soft tissues Vascular calcifications. IMPRESSION: Tricompartment arthritis of the right knee, with articular surface irregularity along the weight-bearing medial femoral condyle and lateral patellar facet. Moderate-sized joint effusion and Baker cyst. Chondrocalcinosis of the medial and  lateral menisci and deep popliteal groove. Findings can be seen with degenerative joint disease and/or CPPD arthropathy. Electronically Signed   By: Maurine Simmering   On: 07/07/2020 11:35   CT KNEE RIGHT WO CONTRAST  Result Date: 07/07/2020 CLINICAL DATA:  Limping, acute, knee pain (Ped 0-5y) EXAM: CT OF THE right KNEE WITHOUT CONTRAST TECHNIQUE: Multidetector CT imaging of the right knee was performed according to the standard protocol. Multiplanar CT image reconstructions were also generated. COMPARISON:  None. FINDINGS: Bones/Joint/Cartilage There is no evidence of acute fracture. Minimal tricompartment osteophyte formation. There are multiple 4-5 mm osteochondral defect or erosive change along the lateral patellar facet, medial trochlea, and anterior weight-bearing medial femoral condyle (axial images 51-54, 71, and coronal image 42 respectively). Additional possible erosive change along the medial upper margin of the joint along the peripheral medial femoral condyle, with well corticated margins (coronal image 61). There is chondrocalcinosis involving both menisci. There is mild medial joint space narrowing. There is a moderate size joint effusion. Ligaments Suboptimally assessed by CT. Muscles and Tendons No muscle atrophy. Soft tissues Vascular calcifications. IMPRESSION: Tricompartment arthritis of the right knee with erosive change versus multiple osteochondral defects as described above. Moderate-sized joint effusion. Findings can be seen with degenerative joint disease and/or CPPD arthropathy. Rheumatoid arthritis is also in the differential. Electronically Signed   By: Maurine Simmering   On: 07/07/2020 11:22   ECHOCARDIOGRAM COMPLETE  Result Date: 07/07/2020    ECHOCARDIOGRAM REPORT   Patient Name:   Ryan Jenkins Date of Exam: 07/07/2020 Medical Rec #:  [id number]         Height:       74.0 in Accession #:    [id number]        Weight:       205.2 lb Date of Birth:  August 31, 1960         BSA:          2.198  m Patient Age:    52 years          BP:           172/88 mmHg Patient Gender: M                 HR:           90 bpm. Exam Location:  Inpatient Procedure: 2D Echo, Cardiac Doppler and Color Doppler Indications:    CHF, chest pain  History:        Patient has prior history of Echocardiogram examinations, most                 recent 07/23/2019. Signs/Symptoms:CKD and Chest Pain; Risk                 Factors:Diabetes and Hypertension.  Sonographer:    Dustin Flock Referring Phys: [place]  1. Left ventricular ejection fraction,  by estimation, is 60 to 65%. The left ventricle has normal function. The left ventricle has no regional wall motion abnormalities. There is severe left ventricular hypertrophy. Left ventricular diastolic parameters  are consistent with Grade I diastolic dysfunction (impaired relaxation). Elevated left atrial pressure.  2. Right ventricular systolic function is normal. The right ventricular size is normal.  3. Left atrial size was mildly dilated.  4. The mitral valve is normal in structure. Trivial mitral valve regurgitation. No evidence of mitral stenosis.  5. The aortic valve is tricuspid. Aortic valve regurgitation is not visualized. Mild aortic valve sclerosis is present, with no evidence of aortic valve stenosis.  6. The inferior vena cava is normal in size with greater than 50% respiratory variability, suggesting right atrial pressure of 3 mmHg. FINDINGS  Left Ventricle: Left ventricular ejection fraction, by estimation, is 60 to 65%. The left ventricle has normal function. The left ventricle has no regional wall motion abnormalities. The left ventricular internal cavity size was normal in size. There is  severe left ventricular hypertrophy. Left ventricular diastolic parameters are consistent with Grade I diastolic dysfunction (impaired relaxation). Elevated left atrial pressure. Right Ventricle: The right ventricular size is normal. Right ventricular systolic  function is normal. Left Atrium: Left atrial size was mildly dilated. Right Atrium: Right atrial size was normal in size. Pericardium: Trivial pericardial effusion is present. Mitral Valve: The mitral valve is normal in structure. Trivial mitral valve regurgitation. No evidence of mitral valve stenosis. Tricuspid Valve: The tricuspid valve is normal in structure. Tricuspid valve regurgitation is not demonstrated. No evidence of tricuspid stenosis. Aortic Valve: The aortic valve is tricuspid. Aortic valve regurgitation is not visualized. Mild aortic valve sclerosis is present, with no evidence of aortic valve stenosis. Pulmonic Valve: The pulmonic valve was normal in structure. Pulmonic valve regurgitation is not visualized. No evidence of pulmonic stenosis. Aorta: The aortic root is normal in size and structure. Venous: The inferior vena cava is normal in size with greater than 50% respiratory variability, suggesting right atrial pressure of 3 mmHg. IAS/Shunts: No atrial level shunt detected by color flow Doppler.  LEFT VENTRICLE PLAX 2D LVIDd:         4.00 cm  Diastology LVIDs:         2.90 cm  LV e' medial:    6.09 cm/s LV PW:         1.50 cm  LV E/e' medial:  16.4 LV IVS:        1.50 cm  LV e' lateral:   6.31 cm/s LVOT diam:     2.20 cm  LV E/e' lateral: 15.8 LV SV:         73 LV SV Index:   33 LVOT Area:     3.80 cm  RIGHT VENTRICLE RV Basal diam:  3.80 cm RV S prime:     11.20 cm/s TAPSE (M-mode): 1.8 cm LEFT ATRIUM             Index       RIGHT ATRIUM           Index LA diam:        4.00 cm 1.82 cm/m  RA Area:     13.70 cm LA Vol (A2C):   77.3 ml 35.18 ml/m RA Volume:   31.80 ml  14.47 ml/m LA Vol (A4C):   73.0 ml 33.22 ml/m LA Biplane Vol: 76.4 ml 34.77 ml/m  AORTIC VALVE LVOT Vmax:   104.00 cm/s LVOT Vmean:  66.400  cm/s LVOT VTI:    0.193 m  AORTA Ao Root diam: 3.30 cm MITRAL VALVE MV Area (PHT): 11.67 cm    SHUNTS MV Decel Time: 65 msec      Systemic VTI:  0.19 m MV E velocity: 100.00 cm/s  Systemic  Diam: 2.20 cm MV A velocity: 107.00 cm/s MV E/A ratio:  0.93 Kirk Ruths MD Electronically signed by Kirk Ruths MD Signature Date/Time: 07/07/2020/11:37:43 AM    Final     Labs: Recent Labs    07/06/20 1901 07/07/20 1004  WBC 8.0 7.7  RBC 3.53* 3.14*  HCT 31.9* 28.3*  PLT 285 247   Recent Labs    07/06/20 1901 07/07/20 1004  NA 135 135  K 3.4* 3.2*  CL 104 104  CO2 24 25  BUN 31* 25*  CREATININE 2.58* 2.33*  GLUCOSE 324* 196*  CALCIUM 8.7* 8.2*   No results for input(s): LABPT, INR in the last 72 hours.  Review of Systems: As stated in HPI  Physical Exam: Body mass index is 26.35 kg/m.  AAOx3  Slightly TTP over bilateral knees left worse than right.   Mild to moderate right knee effusion, moderate to severe joint effusion. No open lesions. NO significant redness or warmth.   Patient does have some mild pain with ROM left worse than right. Mildly decreased ROM in tact.   5/5 plantar flexion and dorsiflexion.  Distal sensation to light touch decreased bilaterally, but patient stated this is his baseline due to neuropathy.  Palpable DP pulses bilaterally.   Assessment and Plan:  Bilateral knee effusions, degenerative joint disease versus CPPD  Recommend IR to do bilateral knee aspitations for pain control as well as fluid analysis which will help better assess for pseudo gout.   Will f/u after results of arthrocentesis. Then he can follow up in 2 weeks as an out-patient for additional work-up/ treatment.    [facility] PA-C [facility]   Agree with above.  Spoke to Hospitalist this afternoon and unfortunately there was miscommunication concerning the arthrocentesis.     Per Hospitalist: radiology with perform knee tap Saturday.  Further recommendations pending the results.

## 2020-07-09 ENCOUNTER — Observation Stay (HOSPITAL_COMMUNITY): Payer: Medicaid Other

## 2020-07-09 DIAGNOSIS — R079 Chest pain, unspecified: Secondary | ICD-10-CM | POA: Diagnosis not present

## 2020-07-09 LAB — GLUCOSE, CAPILLARY
Glucose-Capillary: 131 mg/dL — ABNORMAL HIGH (ref 70–99)
Glucose-Capillary: 152 mg/dL — ABNORMAL HIGH (ref 70–99)

## 2020-07-09 LAB — BASIC METABOLIC PANEL
Anion gap: 4 — ABNORMAL LOW (ref 5–15)
BUN: 31 mg/dL — ABNORMAL HIGH (ref 6–20)
CO2: 26 mmol/L (ref 22–32)
Calcium: 8.2 mg/dL — ABNORMAL LOW (ref 8.9–10.3)
Chloride: 103 mmol/L (ref 98–111)
Creatinine, Ser: 2.55 mg/dL — ABNORMAL HIGH (ref 0.61–1.24)
GFR, Estimated: 28 mL/min — ABNORMAL LOW (ref >60)
Glucose, Bld: 151 mg/dL — ABNORMAL HIGH (ref 70–99)
Potassium: 3.9 mmol/L (ref 3.5–5.1)
Sodium: 133 mmol/L — ABNORMAL LOW (ref 135–145)

## 2020-07-09 LAB — SYNOVIAL CELL COUNT + DIFF, W/ CRYSTALS
Crystals, Fluid: NONE SEEN
Crystals, Fluid: NONE SEEN
Eosinophils-Synovial: 0 % (ref 0–1)
Eosinophils-Synovial: 1 % (ref 0–1)
Lymphocytes-Synovial Fld: 25 % — ABNORMAL HIGH (ref 0–20)
Lymphocytes-Synovial Fld: 7 % (ref 0–20)
Monocyte-Macrophage-Synovial Fluid: 43 % — ABNORMAL LOW (ref 50–90)
Monocyte-Macrophage-Synovial Fluid: 61 % (ref 50–90)
Neutrophil, Synovial: 31 % — ABNORMAL HIGH (ref 0–25)
Neutrophil, Synovial: 32 % — ABNORMAL HIGH (ref 0–25)
WBC, Synovial: 35 /mm3 (ref 0–200)
WBC, Synovial: 47 /mm3 (ref 0–200)

## 2020-07-09 MED ORDER — COLCHICINE 0.6 MG PO TABS
1.2000 mg | ORAL_TABLET | Freq: Once | ORAL | Status: AC
  Administered 2020-07-09: 1.2 mg via ORAL
  Filled 2020-07-09: qty 2

## 2020-07-09 MED ORDER — PANTOPRAZOLE SODIUM 40 MG PO TBEC: 40.0000 mg | DELAYED_RELEASE_TABLET | Freq: Every day | ORAL | 0 refills | Status: DC

## 2020-07-09 MED ORDER — AMLODIPINE BESYLATE 10 MG PO TABS: 10.0000 mg | ORAL_TABLET | Freq: Every day | ORAL | 0 refills | Status: DC

## 2020-07-09 MED ORDER — DAPAGLIFLOZIN PROPANEDIOL 5 MG PO TABS: 5.0000 mg | ORAL_TABLET | Freq: Every day | ORAL | 0 refills | Status: DC

## 2020-07-09 MED ORDER — COLCHICINE 0.6 MG PO TABS: 0.6000 mg | ORAL_TABLET | Freq: Two times a day (BID) | ORAL | 0 refills | Status: DC

## 2020-07-09 NOTE — TOC Progression Note (Signed)
Transition of Care [facility]) - Progression Note    Patient Details  Name: Ryan Jenkins MRN: [id number] Date of Birth: [birth year]  [facility] CM/SW Contact  Joaquin Courts, RN Phone Number: 07/09/2020, 1:49 PM  Clinical Narrative:  CM followed up with [employer] regarding rollator delivery, agency currently out of stock.  [employer] to deliver rollator to bedside, referral given to rep Jermaine.  CM reached out to area agencies for HHPT services, no agency is able to service at this time.  Patient and MD made aware of this, patient is in agreement with outpatient PT services, referral placed.  [facility] date and time.     Expected Discharge Plan: Home/Self Care Barriers to Discharge: No [facility] will accept this patient  Expected Discharge Plan and Services Expected Discharge Plan: Home/Self Care       Living arrangements for the past 2 months: Single Family Home Expected Discharge Date: 07/09/20               DME Arranged: Gilford Rile rolling with seat DME Agency: Other - Comment [facility]) Date DME Agency Contacted: 07/09/20 Time DME Agency Contacted: [id number] Representative spoke with at DME Agency: [place] Determinants of Health ([place]) Interventions    Readmission Risk Interventions No flowsheet data found.

## 2020-07-09 NOTE — Procedures (Signed)
Successful bilateral knee joint aspirations yielding 70 mL of fluid on the right and 60 mL on the left. No immediate complication.

## 2020-07-09 NOTE — Progress Notes (Signed)
AVS given to patient and explained at the bedside. Medications and follow up appointments have been explained with pt verbalizing understanding.  

## 2020-07-09 NOTE — Plan of Care (Signed)
  Problem: Education: Goal: Ability to verbalize understanding of medication therapies will improve Outcome: Completed/Met

## 2020-07-09 NOTE — Discharge Summary (Signed)
Physician Discharge Summary  Ryan Jenkins GYB:[id number] DOB: Dec 29, 1960 DOA: 07/06/2020  PCP: Ryan Jenkins, No  Admit date: 07/06/2020 Discharge date: 07/09/2020    Admitted From: Home Disposition: Home  Recommendations for Outpatient Follow-up:  Follow up with PCP in 1-2 weeks Follow-up with orthopedics in 1 week Please obtain BMP/CBC in one week Please follow up with your PCP on the following pending results: Unresulted Labs (From admission, onward)     Start     Ordered   07/09/20 1319  Synovial cell count + diff, w/ crystals  RELEASE UPON ORDERING,   TIMED        07/09/20 1319   07/09/20 1319  Body fluid culture w Gram Stain  RELEASE UPON ORDERING,   STAT        07/09/20 1319   07/09/20 1307  Synovial cell count + diff, w/ crystals  RELEASE UPON ORDERING,   TIMED        07/09/20 1307   07/09/20 1307  Body fluid culture w Gram Stain  RELEASE UPON ORDERING,   STAT        07/09/20 1307   07/09/20 8768  Basic metabolic panel  Daily,   R      07/08/20 1334   07/08/20 1343  Body fluid culture w Gram Stain  Once,   R       Question:  Are there also cytology or pathology orders on this specimen?  Answer:  No   07/08/20 1342   07/08/20 1343  Synovial cell count + diff, w/ crystals  Once,   R        07/08/20 1342              Home Health: Yes Equipment/Devices: None  Discharge Condition: Stable CODE STATUS: Full code Diet recommendation: Cardiac  Subjective: Seen and examined.  Feels much better.  No more abdominal pain or chest pain.  Knee pain is already improved.  Brief/Interim Summary: Ryan Jenkins is a 60 y.o. male with medical history significant of DM2 with neuropathy, HTN, CKD 4, suspected non-adherence to medication / diet regimen presented to ED for complaint of chest pain since about 1 to 2 days along with mild pedal edema bilateral lower extremities.  No other complaint.   Last year around this time pt was admitted for HTN urgency, uncontrolled DM2 with A1C  of 13.3%. Pt then moved out of state to [place].  Pt was admitted to hospital in [place] from but ended up with having acute kidney failure, had respiratory failure due to pulmonary edema requiring intubation.  Ultimately spent several days getting dialysis before having renal recovery.  LHC at that admission = 60% stenosis of RCA nothing stent able; TEE = preserved EF, "unremarkable" according to hospitalist..  Since then baseline creat seems to be ~2.5 or so. Did have urinary retention that was managed with foley for the next couple of months, foley ultimately removed.   Pt had been in SNF up until ~2 weeks ago.  Looks like DM2 and HTN had been fairly well controlled in SNF with 2 most recent A1Cs of 5.x and 7.0 most recently just 1 month ago (per PCP note).  BP on that PCP visit was 126/62.  Patient was this time admitted due to chest pain.  Detailed hospitalization as below.  Chest pain/upper abdominal pain/presumed gastritis: Troponins around 40x2 but transthoracic echo negative for any wall motion abnormality and has normal ejection fraction with grade 1 diastolic dysfunction.  He is pointing  towards upper abdomen and has very minimal epigastric tenderness as well.  He characterizes his pain as burning.  Likely has gastritis.  He was started on omeprazole here and is being discharged on that.  Bilateral knee arthritis/CPPD/joint effusion: He complained of bilateral knee joint pain which is chronic for him.  Consulted orthopedics who deferred arthrocentesis to IR.  Patient underwent bilateral knee arthrocentesis today.  Fluid analysis pending however patient is feeling much better and prefers to go home.  I suspect CPPD as mentioned in the CT scan.  There are no signs or symptoms of septic arthritis.  And for that reason, we will let him go today.  I have prescribed him colchicine which he will get 1.2 mg here before discharge and then 0.6 mg twice daily for 7 days and follow-up with orthopedics in  1 week.   Type 2 diabetes mellitus: Blood sugar controlled, continue Farxiga, Lantus 10 units and SSI.   Essential hypertension: Blood pressure now better controlled.  Continue current regimen which includes clonidine, Toprol-XL, hydralazine, chlorthalidone and amlodipine.   BPH: Continue Flomax.   Anxiety: Continue benzodiazepines.   Hyperlipidemia: Continue Crestor.   CKD stage IIIb/IV: Creatinine has remained stable.  Discharge Diagnoses:  Principal Problem:   Chest pain Active Problems:   Diabetic neuropathy, type II diabetes mellitus (HCC)   Essential hypertension   History of medication noncompliance   CKD (chronic kidney disease) stage 4, GFR 15-29 ml/min (HCC)   Acute on chronic diastolic CHF (congestive heart failure) (HCC)    Discharge Instructions   Allergies as of 07/09/2020       Reactions   Claritin [loratadine] Swelling   Joint swelling   Hydrochlorothiazide Other (See Comments)   Dizziness   Latex Hives   Lyrica [pregabalin] Other (See Comments)   depression   Metformin And Related Other (See Comments)   GI        Medication List     STOP taking these medications    atorvastatin 40 MG tablet Commonly known as: LIPITOR   cetirizine 10 MG tablet Commonly known as: ZYRTEC   cyclobenzaprine 5 MG tablet Commonly known as: FLEXERIL   furosemide 20 MG tablet Commonly known as: LASIX   gabapentin 300 MG capsule Commonly known as: NEURONTIN   HYDROcodone-acetaminophen 5-325 MG tablet Commonly known as: NORCO/VICODIN   insulin lispro 100 UNIT/ML KwikPen Commonly known as: HumaLOG KwikPen   Lantus SoloStar 100 UNIT/ML Solostar Pen Generic drug: insulin glargine   lisinopril 40 MG tablet Commonly known as: ZESTRIL   vitamin B-12 1000 MCG tablet Commonly known as: CYANOCOBALAMIN       TAKE these medications    acetaminophen 500 MG tablet Commonly known as: TYLENOL Take 1,000 mg by mouth in the morning and at bedtime.    amLODipine 10 MG tablet Commonly known as: NORVASC Take 1 tablet (10 mg total) by mouth daily. Start taking on: July 10, 2020   carvedilol 3.125 MG tablet Commonly known as: COREG Take 1 tablet (3.125 mg total) by mouth 2 (two) times daily with a meal.   chlorthalidone 25 MG tablet Commonly known as: HYGROTON Take 12.5 mg by mouth every other day.   cloNIDine 0.1 mg/24hr patch Commonly known as: CATAPRES - Dosed in mg/24 hr Place 0.1 mg onto the skin once a week.   colchicine 0.6 MG tablet Take 1 tablet (0.6 mg total) by mouth 2 (two) times daily for 7 days.   D-Mannose 350 MG Caps Take 1,050 capsules by mouth  in the morning and at bedtime.   dapagliflozin propanediol 5 MG Tabs tablet Commonly known as: FARXIGA Take 1 tablet (5 mg total) by mouth daily. Start taking on: July 10, 2020   escitalopram 20 MG tablet Commonly known as: LEXAPRO Take 1 tablet (20 mg total) by mouth daily.   finasteride 5 MG tablet Commonly known as: PROSCAR Take 5 mg by mouth daily.   glucose blood test strip Use as instructed   hydrALAZINE 100 MG tablet Commonly known as: APRESOLINE Take 100 mg by mouth 3 (three) times daily.   hydrALAZINE 25 MG tablet Commonly known as: APRESOLINE Take 25 mg by mouth 3 (three) times daily.   insulin detemir 100 UNIT/ML injection Commonly known as: LEVEMIR Inject 10 Units into the skin daily.   loperamide 2 MG capsule Commonly known as: IMODIUM Take 2 mg by mouth See admin instructions. Give 1 tablet by mouth every 2 hours as needed for each loose stool not to exceed 58m in 24 hours.   metoprolol succinate 100 MG 24 hr tablet Commonly known as: TOPROL-XL Take 150 mg by mouth at bedtime. Take with or immediately following a meal.   multivitamin with minerals tablet Take 1 tablet by mouth daily.   nortriptyline 25 MG capsule Commonly known as: PAMELOR Take 25 mg by mouth at bedtime.   ondansetron 4 MG tablet Commonly known as: ZOFRAN Take  1 tablet (4 mg total) by mouth every 8 (eight) hours as needed for nausea or vomiting.   pantoprazole 40 MG tablet Commonly known as: PROTONIX Take 1 tablet (40 mg total) by mouth daily.   Pen Needles 31G X 8 MM Misc Use as directed   rosuvastatin 20 MG tablet Commonly known as: CRESTOR Take 20 mg by mouth daily.   tadalafil 5 MG tablet Commonly known as: CIALIS Take 5 mg by mouth daily.   tamsulosin 0.4 MG Caps capsule Commonly known as: FLOMAX Take 0.4 mg by mouth daily after breakfast.   traMADol 50 MG tablet Commonly known as: ULTRAM Take 50 mg by mouth 2 (two) times daily.   traZODone 50 MG tablet Commonly known as: DESYREL Take 150 mg by mouth at bedtime.   True Metrix Meter w/Device Kit Use as directed   TRUEplus Lancets 28G Misc Use as directed               Durable Medical Equipment  (From admission, onward)           Start     Ordered   07/08/20 0923  For home use only DME 4 wheeled rolling walker with seat  Once       Question:  Patient needs a walker to treat with the following condition  Answer:  Fear for personal safety   07/08/20 0923   07/07/20 1556  For home use only DME Bedside commode  Once       Question:  Patient needs a bedside commode to treat with the following condition  Answer:  Fear for personal safety   07/07/20 1557   07/07/20 1556  For home use only DME Shower stool  Once        07/07/20 1557            Follow-up Information     [facility] Go in 5 day(s).   Why: Appointment at 6/29 at 10:50 AM Contact information: [address] [place] [address]       BMelina Schools MD  Follow up in 1 week(s).   Specialty: Orthopedic Surgery Contact information: [address] [place] [address] [phone]                Allergies  Allergen Reactions   Claritin [Loratadine] Swelling    Joint swelling    Hydrochlorothiazide Other (See  Comments)    Dizziness   Latex Hives   Lyrica [Pregabalin] Other (See Comments)    depression   Metformin And Related Other (See Comments)    GI    Consultations: Orthopedics   Procedures/Studies: CT Abdomen Pelvis Wo Contrast  Result Date: 07/06/2020 CLINICAL DATA:  Chest and abdominal pain EXAM: CT ABDOMEN AND PELVIS WITHOUT CONTRAST TECHNIQUE: Multidetector CT imaging of the abdomen and pelvis was performed following the standard protocol without IV contrast. COMPARISON:  None. FINDINGS: Lower chest: No acute abnormality. Hepatobiliary: No focal liver abnormality is seen. No gallstones, gallbladder wall thickening, or biliary dilatation. Pancreas: Unremarkable. No pancreatic ductal dilatation or surrounding inflammatory changes. Spleen: Normal in size without focal abnormality. Adrenals/Urinary Tract: Right adrenal gland is within normal limits. 3 cm hypodense lesion in the left adrenal gland is noted likely representing an adenoma. Kidneys are well visualize without evidence of renal calculi or obstructive changes. Mild perinephric stranding is noted of uncertain significance. Ureters are within normal limits. Bladder is partially distended. Stomach/Bowel: The appendix is well visualized and within normal limits. No obstructive or inflammatory changes of the colon are seen. Small bowel and stomach are unremarkable. Vascular/Lymphatic: No significant vascular findings are present. No enlarged abdominal or pelvic lymph nodes. Reproductive: Prostate is unremarkable. Calcification of the seminal vesicles and vas deferens is noted. Other: No abdominal wall hernia or abnormality. No abdominopelvic ascites. Musculoskeletal: Degenerative changes of lumbar spine are noted. IMPRESSION: No acute abnormality is noted within the abdomen. 3 cm hypodense lesion in the left adrenal gland likely representing an adenoma. No other focal abnormality is noted. Electronically Signed   By: Inez Catalina M.D.   On:  07/06/2020 23:28   DG Chest 2 View  Result Date: 07/06/2020 CLINICAL DATA:  Chest pain EXAM: CHEST - 2 VIEW COMPARISON:  07/22/2019 FINDINGS: The heart size and mediastinal contours are within normal limits. Both lungs are clear. The visualized skeletal structures are unremarkable. IMPRESSION: No active cardiopulmonary disease. Electronically Signed   By: Fidela Salisbury MD   On: 07/06/2020 20:06   DG THORACOLUMABAR SPINE  Result Date: 07/07/2020 CLINICAL DATA:  Patient fell 3 days ago EXAM: THORACOLUMBAR SPINE 2V COMPARISON:  CT abdomen pelvis 07/06/2020 FINDINGS: There is no evidence of fracture of the lower thoracic and lumbar. Preserved disc heights with peripherally calcified posterior bulging discs from L3-S1. Tubular calcifications in the pelvis. IMPRESSION: No evidence of fracture in the visualized lower thoracic and lumbar spine. Electronically Signed   By: Maurine Simmering   On: 07/07/2020 11:26   CT KNEE LEFT WO CONTRAST  Result Date: 07/07/2020 CLINICAL DATA:  Limping, acute, knee pain (Ped 0-5y) EXAM: CT OF THE left KNEE WITHOUT CONTRAST TECHNIQUE: Multidetector CT imaging of the left knee was performed according to the standard protocol. Multiplanar CT image reconstructions were also generated. COMPARISON:  None. FINDINGS: Bones/Joint/Cartilage There is no evidence of fracture. There is tricompartment osteophyte formation. There is subchondral sclerosis and cystic change. There is a broad area of articular surface irregularity with likely osteochondral defects along the weight-bearing medial femoral condyle (sagittal image 31). Similar finding along the lateral patellar facet (axial images 53-57). There is a  deep popliteal groove (coronal image 54). There is chondrocalcinosis of the medial and lateral menisci and along the ACL. There is a moderate-sized joint effusion. There is a moderate size Baker cyst. Ligaments Suboptimally assessed by CT. Muscles and Tendons No significant muscle atrophy.  Soft tissues Vascular calcifications. IMPRESSION: Tricompartment arthritis of the right knee, with articular surface irregularity along the weight-bearing medial femoral condyle and lateral patellar facet. Moderate-sized joint effusion and Baker cyst. Chondrocalcinosis of the medial and lateral menisci and deep popliteal groove. Findings can be seen with degenerative joint disease and/or CPPD arthropathy. Electronically Signed   By: Maurine Simmering   On: 07/07/2020 11:35   CT KNEE RIGHT WO CONTRAST  Result Date: 07/07/2020 CLINICAL DATA:  Limping, acute, knee pain (Ped 0-5y) EXAM: CT OF THE right KNEE WITHOUT CONTRAST TECHNIQUE: Multidetector CT imaging of the right knee was performed according to the standard protocol. Multiplanar CT image reconstructions were also generated. COMPARISON:  None. FINDINGS: Bones/Joint/Cartilage There is no evidence of acute fracture. Minimal tricompartment osteophyte formation. There are multiple 4-5 mm osteochondral defect or erosive change along the lateral patellar facet, medial trochlea, and anterior weight-bearing medial femoral condyle (axial images 51-54, 71, and coronal image 42 respectively). Additional possible erosive change along the medial upper margin of the joint along the peripheral medial femoral condyle, with well corticated margins (coronal image 61). There is chondrocalcinosis involving both menisci. There is mild medial joint space narrowing. There is a moderate size joint effusion. Ligaments Suboptimally assessed by CT. Muscles and Tendons No muscle atrophy. Soft tissues Vascular calcifications. IMPRESSION: Tricompartment arthritis of the right knee with erosive change versus multiple osteochondral defects as described above. Moderate-sized joint effusion. Findings can be seen with degenerative joint disease and/or CPPD arthropathy. Rheumatoid arthritis is also in the differential. Electronically Signed   By: Maurine Simmering   On: 07/07/2020 11:22   DG FLUORO  GUIDED NEEDLE PLC ASPIRATION/INJECTION LOC  Result Date: 07/09/2020 CLINICAL DATA:  Bilateral knee pain with joint effusions. Request for bilateral knee aspirations for diagnostic and therapeutic purposes. EXAM: BILATERAL KNEE ASPIRATION UNDER FLUOROSCOPY COMPARISON:  Bilateral knee CT 07/07/2020 FLUOROSCOPY TIME:  Fluoroscopy Time:  6 seconds Radiation Exposure Index (if provided by the fluoroscopic device): 0.30 mGy Number of Acquired Spot Images: 0 PROCEDURE: Overlying skin prepped with Betadine, draped in the usual sterile fashion, and infiltrated locally with 1% lidocaine. 18 gauge 1.5 inch needles were advanced into the right and left knee joints via a medial patellofemoral approach. Clear joint fluid was aspirated bilaterally (blood tinged on the right). 70 mL of fluid were collected on the right and 60 mL on the left and were sent for the requested laboratory studies. The needles were removed and bandages were placed. There was no immediate complication. IMPRESSION: Technically successful bilateral knee joint aspirations. Electronically Signed   By: Logan Bores M.D.   On: 07/09/2020 13:21   DG FLUORO GUIDED NEEDLE PLC ASPIRATION/INJECTION LOC  Result Date: 07/09/2020 CLINICAL DATA:  Bilateral knee pain with joint effusions. Request for bilateral knee aspirations for diagnostic and therapeutic purposes. EXAM: BILATERAL KNEE ASPIRATION UNDER FLUOROSCOPY COMPARISON:  Bilateral knee CT 07/07/2020 FLUOROSCOPY TIME:  Fluoroscopy Time:  6 seconds Radiation Exposure Index (if provided by the fluoroscopic device): 0.30 mGy Number of Acquired Spot Images: 0 PROCEDURE: Overlying skin prepped with Betadine, draped in the usual sterile fashion, and infiltrated locally with 1% lidocaine. 18 gauge 1.5 inch needles were advanced into the right and left knee joints via a medial patellofemoral  approach. Clear joint fluid was aspirated bilaterally (blood tinged on the right). 70 mL of fluid were collected on the right  and 60 mL on the left and were sent for the requested laboratory studies. The needles were removed and bandages were placed. There was no immediate complication. IMPRESSION: Technically successful bilateral knee joint aspirations. Electronically Signed   By: Logan Bores M.D.   On: 07/09/2020 13:21   ECHOCARDIOGRAM COMPLETE  Result Date: 07/07/2020    ECHOCARDIOGRAM REPORT   Patient Name:   Ryan Jenkins Date of Exam: 07/07/2020 Medical Rec #:  [id number]         Height:       74.0 in Accession #:    [id number]        Weight:       205.2 lb Date of Birth:  21-Jun-1960         BSA:          2.198 m Patient Age:    82 years          BP:           172/88 mmHg Patient Gender: M                 HR:           90 bpm. Exam Location:  Inpatient Procedure: 2D Echo, Cardiac Doppler and Color Doppler Indications:    CHF, chest pain  History:        Patient has prior history of Echocardiogram examinations, most                 recent 07/23/2019. Signs/Symptoms:CKD and Chest Pain; Risk                 Factors:Diabetes and Hypertension.  Sonographer:    Dustin Flock Referring Phys: [place]  1. Left ventricular ejection fraction, by estimation, is 60 to 65%. The left ventricle has normal function. The left ventricle has no regional wall motion abnormalities. There is severe left ventricular hypertrophy. Left ventricular diastolic parameters  are consistent with Grade I diastolic dysfunction (impaired relaxation). Elevated left atrial pressure.  2. Right ventricular systolic function is normal. The right ventricular size is normal.  3. Left atrial size was mildly dilated.  4. The mitral valve is normal in structure. Trivial mitral valve regurgitation. No evidence of mitral stenosis.  5. The aortic valve is tricuspid. Aortic valve regurgitation is not visualized. Mild aortic valve sclerosis is present, with no evidence of aortic valve stenosis.  6. The inferior vena cava is normal in size with  greater than 50% respiratory variability, suggesting right atrial pressure of 3 mmHg. FINDINGS  Left Ventricle: Left ventricular ejection fraction, by estimation, is 60 to 65%. The left ventricle has normal function. The left ventricle has no regional wall motion abnormalities. The left ventricular internal cavity size was normal in size. There is  severe left ventricular hypertrophy. Left ventricular diastolic parameters are consistent with Grade I diastolic dysfunction (impaired relaxation). Elevated left atrial pressure. Right Ventricle: The right ventricular size is normal. Right ventricular systolic function is normal. Left Atrium: Left atrial size was mildly dilated. Right Atrium: Right atrial size was normal in size. Pericardium: Trivial pericardial effusion is present. Mitral Valve: The mitral valve is normal in structure. Trivial mitral valve regurgitation. No evidence of mitral valve stenosis. Tricuspid Valve: The tricuspid valve is normal in structure. Tricuspid valve regurgitation is not demonstrated. No evidence of tricuspid stenosis. Aortic Valve:  The aortic valve is tricuspid. Aortic valve regurgitation is not visualized. Mild aortic valve sclerosis is present, with no evidence of aortic valve stenosis. Pulmonic Valve: The pulmonic valve was normal in structure. Pulmonic valve regurgitation is not visualized. No evidence of pulmonic stenosis. Aorta: The aortic root is normal in size and structure. Venous: The inferior vena cava is normal in size with greater than 50% respiratory variability, suggesting right atrial pressure of 3 mmHg. IAS/Shunts: No atrial level shunt detected by color flow Doppler.  LEFT VENTRICLE PLAX 2D LVIDd:         4.00 cm  Diastology LVIDs:         2.90 cm  LV e' medial:    6.09 cm/s LV PW:         1.50 cm  LV E/e' medial:  16.4 LV IVS:        1.50 cm  LV e' lateral:   6.31 cm/s LVOT diam:     2.20 cm  LV E/e' lateral: 15.8 LV SV:         73 LV SV Index:   33 LVOT Area:      3.80 cm  RIGHT VENTRICLE RV Basal diam:  3.80 cm RV S prime:     11.20 cm/s TAPSE (M-mode): 1.8 cm LEFT ATRIUM             Index       RIGHT ATRIUM           Index LA diam:        4.00 cm 1.82 cm/m  RA Area:     13.70 cm LA Vol (A2C):   77.3 ml 35.18 ml/m RA Volume:   31.80 ml  14.47 ml/m LA Vol (A4C):   73.0 ml 33.22 ml/m LA Biplane Vol: 76.4 ml 34.77 ml/m  AORTIC VALVE LVOT Vmax:   104.00 cm/s LVOT Vmean:  66.400 cm/s LVOT VTI:    0.193 m  AORTA Ao Root diam: 3.30 cm MITRAL VALVE MV Area (PHT): 11.67 cm    SHUNTS MV Decel Time: 65 msec      Systemic VTI:  0.19 m MV E velocity: 100.00 cm/s  Systemic Diam: 2.20 cm MV A velocity: 107.00 cm/s MV E/A ratio:  0.93 Kirk Ruths MD Electronically signed by Kirk Ruths MD Signature Date/Time: 07/07/2020/11:37:43 AM    Final      Discharge Exam: Vitals:   07/09/20 0830 07/09/20 1300  BP: (!) 142/73 136/71  Pulse: 67 69  Resp:  16  Temp:  97.7 F (36.5 C)  SpO2:  100%   Vitals:   07/08/20 2044 07/09/20 0450 07/09/20 0830 07/09/20 1300  BP: 126/63 132/70 (!) 142/73 136/71  Pulse: 63 69 67 69  Resp: 18 19  16   Temp: 98.6 F (37 C) 98.7 F (37.1 C)  97.7 F (36.5 C)  TempSrc: Oral Oral  Oral  SpO2: 99% 98%  100%  Weight:      Height:        General: Pt is alert, awake, not in acute distress Cardiovascular: RRR, S1/S2 +, no rubs, no gallops Respiratory: CTA bilaterally, no wheezing, no rhonchi Abdominal: Soft, NT, ND, bowel sounds + Extremities: no edema, no cyanosis    The results of significant diagnostics from this hospitalization (including imaging, microbiology, ancillary and laboratory) are listed below for reference.     Microbiology: Recent Results (from the past 240 hour(s))  Resp Panel by RT-PCR (Flu A&B, Covid) Nasopharyngeal Swab     Status:  None   Collection Time: 07/06/20 10:03 PM   Specimen: Nasopharyngeal Swab; Nasopharyngeal(NP) swabs in vial transport medium  Result Value Ref Range Status   SARS  Coronavirus 2 by RT PCR NEGATIVE NEGATIVE Final    Comment: (NOTE) SARS-CoV-2 target nucleic acids are NOT DETECTED.  The SARS-CoV-2 RNA is generally detectable in upper respiratory specimens during the acute phase of infection. The lowest concentration of SARS-CoV-2 viral copies this assay can detect is 138 copies/mL. A negative result does not preclude SARS-Cov-2 infection and should not be used as the sole basis for treatment or other patient management decisions. A negative result may occur with  improper specimen collection/handling, submission of specimen other than nasopharyngeal swab, presence of viral mutation(s) within the areas targeted by this assay, and inadequate number of viral copies(<138 copies/mL). A negative result must be combined with clinical observations, patient history, and epidemiological information. The expected result is Negative.  Fact Sheet for Patients:  EntrepreneurPulse.com.au  Fact Sheet for Healthcare Providers:  IncredibleEmployment.be  This test is no t yet approved or cleared by the [place] FDA and  has been authorized for detection and/or diagnosis of SARS-CoV-2 by FDA under an Emergency Use Authorization (EUA). This EUA will remain  in effect (meaning this test can be used) for the duration of the COVID-19 declaration under Section 564(b)(1) of the Act, 21 U.S.C.section 360bbb-3(b)(1), unless the authorization is terminated  or revoked sooner.       Influenza A by PCR NEGATIVE NEGATIVE Final   Influenza B by PCR NEGATIVE NEGATIVE Final    Comment: (NOTE) The Xpert Xpress SARS-CoV-2/FLU/RSV plus assay is intended as an aid in the diagnosis of influenza from Nasopharyngeal swab specimens and should not be used as a sole basis for treatment. Nasal washings and aspirates are unacceptable for Xpert Xpress SARS-CoV-2/FLU/RSV testing.  Fact Sheet for  Patients: EntrepreneurPulse.com.au  Fact Sheet for Healthcare Providers: IncredibleEmployment.be  This test is not yet approved or cleared by the [place] FDA and has been authorized for detection and/or diagnosis of SARS-CoV-2 by FDA under an Emergency Use Authorization (EUA). This EUA will remain in effect (meaning this test can be used) for the duration of the COVID-19 declaration under Section 564(b)(1) of the Act, 21 U.S.C. section 360bbb-3(b)(1), unless the authorization is terminated or revoked.  Performed at [facility], [place] [address], [place], [place] [address]      Labs: BNP (last 3 results) Recent Labs    07/22/19 1711 07/06/20 1901 07/08/20 0938  BNP 230.6* 570.7* 229.7*   Basic Metabolic Panel: Recent Labs  Lab 07/06/20 1901 07/07/20 1004 07/08/20 0938 07/09/20 0403  NA 135 135 135 133*  K 3.4* 3.2* 3.7 3.9  CL 104 104 104 103  CO2 24 25 25 26   GLUCOSE 324* 196* 150* 151*  BUN 31* 25* 25* 31*  CREATININE 2.58* 2.33* 2.25* 2.55*  CALCIUM 8.7* 8.2* 8.4* 8.2*   Liver Function Tests: Recent Labs  Lab 07/06/20 1901  AST 25  ALT 21  ALKPHOS 71  BILITOT 0.6  PROT 7.1  ALBUMIN 3.5   Recent Labs  Lab 07/06/20 1901  LIPASE 27   No results for input(s): AMMONIA in the last 168 hours. CBC: Recent Labs  Lab 07/06/20 1901 07/07/20 1004 07/08/20 0938  WBC 8.0 7.7 7.7  NEUTROABS  --  5.1 4.6  HGB 10.4* 9.4* 9.5*  HCT 31.9* 28.3* 28.7*  MCV 90.4 90.1 90.3  PLT 285 247 250   Cardiac Enzymes:  No results for input(s): CKTOTAL, CKMB, CKMBINDEX, TROPONINI in the last 168 hours. BNP: Invalid input(s): POCBNP CBG: Recent Labs  Lab 07/08/20 1228 07/08/20 1713 07/08/20 2042 07/09/20 0734 07/09/20 1121  GLUCAP 137* 157* 154* 131* 152*   D-Dimer No results for input(s): DDIMER in the last 72 hours. Hgb A1c Recent Labs    07/07/20 0036  HGBA1C 7.8*   Lipid Profile No results  for input(s): CHOL, HDL, LDLCALC, TRIG, CHOLHDL, LDLDIRECT in the last 72 hours. Thyroid function studies No results for input(s): TSH, T4TOTAL, T3FREE, THYROIDAB in the last 72 hours.  Invalid input(s): FREET3 Anemia work up No results for input(s): VITAMINB12, FOLATE, FERRITIN, TIBC, IRON, RETICCTPCT in the last 72 hours. Urinalysis    Component Value Date/Time   COLORURINE STRAW (A) 07/22/2019 2000   APPEARANCEUR CLEAR 07/22/2019 2000   LABSPEC 1.013 07/22/2019 2000   PHURINE 6.0 07/22/2019 2000   GLUCOSEU >=500 (A) 07/22/2019 2000   HGBUR SMALL (A) 07/22/2019 2000   BILIRUBINUR NEGATIVE 07/22/2019 2000   KETONESUR NEGATIVE 07/22/2019 2000   PROTEINUR >=300 (A) 07/22/2019 2000   NITRITE NEGATIVE 07/22/2019 2000   LEUKOCYTESUR NEGATIVE 07/22/2019 2000   Sepsis Labs Invalid input(s): PROCALCITONIN,  WBC,  LACTICIDVEN Microbiology Recent Results (from the past 240 hour(s))  Resp Panel by RT-PCR (Flu A&B, Covid) Nasopharyngeal Swab     Status: None   Collection Time: 07/06/20 10:03 PM   Specimen: Nasopharyngeal Swab; Nasopharyngeal(NP) swabs in vial transport medium  Result Value Ref Range Status   SARS Coronavirus 2 by RT PCR NEGATIVE NEGATIVE Final    Comment: (NOTE) SARS-CoV-2 target nucleic acids are NOT DETECTED.  The SARS-CoV-2 RNA is generally detectable in upper respiratory specimens during the acute phase of infection. The lowest concentration of SARS-CoV-2 viral copies this assay can detect is 138 copies/mL. A negative result does not preclude SARS-Cov-2 infection and should not be used as the sole basis for treatment or other patient management decisions. A negative result may occur with  improper specimen collection/handling, submission of specimen other than nasopharyngeal swab, presence of viral mutation(s) within the areas targeted by this assay, and inadequate number of viral copies(<138 copies/mL). A negative result must be combined with clinical  observations, patient history, and epidemiological information. The expected result is Negative.  Fact Sheet for Patients:  EntrepreneurPulse.com.au  Fact Sheet for Healthcare Providers:  IncredibleEmployment.be  This test is no t yet approved or cleared by the [place] FDA and  has been authorized for detection and/or diagnosis of SARS-CoV-2 by FDA under an Emergency Use Authorization (EUA). This EUA will remain  in effect (meaning this test can be used) for the duration of the COVID-19 declaration under Section 564(b)(1) of the Act, 21 U.S.C.section 360bbb-3(b)(1), unless the authorization is terminated  or revoked sooner.       Influenza A by PCR NEGATIVE NEGATIVE Final   Influenza B by PCR NEGATIVE NEGATIVE Final    Comment: (NOTE) The Xpert Xpress SARS-CoV-2/FLU/RSV plus assay is intended as an aid in the diagnosis of influenza from Nasopharyngeal swab specimens and should not be used as a sole basis for treatment. Nasal washings and aspirates are unacceptable for Xpert Xpress SARS-CoV-2/FLU/RSV testing.  Fact Sheet for Patients: EntrepreneurPulse.com.au  Fact Sheet for Healthcare Providers: IncredibleEmployment.be  This test is not yet approved or cleared by the [place] FDA and has been authorized for detection and/or diagnosis of SARS-CoV-2 by FDA under an Emergency Use Authorization (EUA). This EUA will remain in effect (meaning this  test can be used) for the duration of the COVID-19 declaration under Section 564(b)(1) of the Act, 21 U.S.C. section 360bbb-3(b)(1), unless the authorization is terminated or revoked.  Performed at [facility], [place] [address], [place], [place] [address]      Time coordinating discharge: Over 30 minutes  SIGNED:   Darliss Cheney, MD  [facility] 07/09/2020, 1:28 PM  If 7PM-7AM, please contact  night-coverage [email or web address]

## 2020-07-09 NOTE — Plan of Care (Signed)
  Problem: Cardiac: Goal: Ability to achieve and maintain adequate cardiopulmonary perfusion will improve Outcome: Completed/Met   Problem: Education: Goal: Ability to verbalize understanding of medication therapies will improve Outcome: Completed/Met

## 2020-07-10 LAB — GRAM STAIN

## 2020-07-12 NOTE — Progress Notes (Signed)
Subjective:    Ryan Jenkins - 60 y.o. male MRN [id number]  Date of birth: 1960-02-04  HPI  Ryan Jenkins is to establish care and hospital discharge follow-up. Patient has a PMH significant for essential hypertension, acute on chronic diastolic congestive heart failure, gastroparesis, gastroesophageal reflux disease without esophagitis, gastric ulcer without hemorrhage or perforation, uncontrolled type 2 diabetes mellitus with peripheral neuropathy, macroalbuminuric diabetic nephropathy, diabetic retinopathy of both eyes associated with type 2 diabetes mellitus, erectile dysfunction associated with type 2 diabetes mellitus, chronic kidney disease stage III, chronic kidney disease stage 4, depression, hyperlipidemia, and vitamin B12 deficiency.   Visit 07/06/2020 - 07/09/2020 at [facility] per MD note: Ryan Jenkins is a 60 y.o. male with medical history significant of DM2 with neuropathy, HTN, CKD 4, suspected non-adherence to medication / diet regimen presented to ED for complaint of chest pain since about 1 to 2 days along with mild pedal edema bilateral lower extremities.  No other complaint.   Last year around this time pt was admitted for HTN urgency, uncontrolled DM2 with A1C of 13.3%. Pt then moved out of state to [place].  Pt was admitted to hospital in [place] from but ended up with having acute kidney failure, had respiratory failure due to pulmonary edema requiring intubation.  Ultimately spent several days getting dialysis before having renal recovery.  LHC at that admission = 60% stenosis of RCA nothing stent able; TEE = preserved EF, "unremarkable" according to hospitalist..  Since then baseline creat seems to be ~2.5 or so. Did have urinary retention that was managed with foley for the next couple of months, foley ultimately removed.   Pt had been in SNF up until ~2 weeks ago.  Looks like DM2 and HTN had been fairly well controlled in SNF with 2 most recent A1Cs  of 5.x and 7.0 most recently just 1 month ago (per PCP note).  BP on that PCP visit was 126/62.   Patient was this time admitted due to chest pain.  Detailed hospitalization as below.   Chest pain/upper abdominal pain/presumed gastritis: Troponins around 40x2 but transthoracic echo negative for any wall motion abnormality and has normal ejection fraction with grade 1 diastolic dysfunction.  He is pointing towards upper abdomen and has very minimal epigastric tenderness as well.  He characterizes his pain as burning.  Likely has gastritis.  He was started on omeprazole here and is being discharged on that.   Bilateral knee arthritis/CPPD/joint effusion: He complained of bilateral knee joint pain which is chronic for him.  Consulted orthopedics who deferred arthrocentesis to IR.  Patient underwent bilateral knee arthrocentesis today.  Fluid analysis pending however patient is feeling much better and prefers to go home.  I suspect CPPD as mentioned in the CT scan.  There are no signs or symptoms of septic arthritis.  And for that reason, we will let him go today.  I have prescribed him colchicine which he will get 1.2 mg here before discharge and then 0.6 mg twice daily for 7 days and follow-up with orthopedics in 1 week.   Type 2 diabetes mellitus: Blood sugar controlled, continue Farxiga, Lantus 10 units and SSI.   Essential hypertension: Blood pressure now better controlled.  Continue current regimen which includes clonidine, Toprol-XL, hydralazine, chlorthalidone and amlodipine.   BPH: Continue Flomax.   Anxiety: Continue benzodiazepines.   Hyperlipidemia: Continue Crestor.   CKD stage IIIb/IV: Creatinine has remained stable.   Discharge Diagnoses:  Principal Problem:  Chest pain Active Problems:   Diabetic neuropathy, type II diabetes mellitus ([place])   Essential hypertension   History of medication noncompliance   CKD (chronic kidney disease) stage 4, GFR 15-29 ml/min (H[facility])   Acute on  chronic diastolic CHF (congestive heart failure) ([place])    07/13/2020: CHEST PAIN/GASTRITIS FOLLOW-UP: No longer having chest pain. Still having stomach pain located at the upper stomach. Taking Omeprazole as prescribed.    2. KNEE ARTHRITIS FOLLOW-UP: Reports he is aware of 07/19/2020 appointment with Physical Therapy. Using a Rolator to assist with ambulation.   3.DIABETES TYPE 2 FOLLOW-UP: Reports since hospital discharge has only been taking [place]. Wasn't aware he was supposed to take Lantus. Reports his brother manages his medications. Reports taking Flexeril for neuropathy and muscle aches. Requesting blood sugar testing supplies.  4. HYPERTENSION FOLLOW-UP: Taking Clonidine, Toprol-XL, Hydralazine, Chlorthalidone and Amlodipine. Reports his blood pressures have always been too high. Requesting a blood pressure monitor.  5. HEART FAILURE FOLLOW-UP: Hasn't seen Cardiology since leaving [place].   6. BPH FOLLOW-UP: Taking Flomax. He does have incontinence sometimes and wearing Depends.   7. SCROTAL SWELLING: Reports scrotal swelling and pain since catheter was discontinued in hospital. Concern for repeat infections. No blood in urine. Requesting referral to Urology.   8. ANXIETY FOLLOW-UP: Reports he isn't sure which medications he is taking for anxiety. Reports he will have his brother call back with names of medications. Denies thoughts of self-harm, suicidal ideations, and homicidal ideations.  9. CHOLESTEROL FOLLOW-UP: Taking Crestor.  10. CKD FOLLOW-UP: Hasn't seen Nephrology since leaving [place].  11. INSOMNIA: Began 1 month ago. Cannot identify any triggers. Was given Ambien and reports did not help.  ROS per HPI    Health Maintenance:  Health Maintenance Due  Topic Date Due   OPHTHALMOLOGY EXAM  Never done   Zoster Vaccines- Shingrix (1 of 2) Never done   FOOT EXAM  02/05/2018   COVID-19 Vaccine (3 - Booster for Pfizer series) 10/25/2019     Past  Medical History: Patient Active Problem List   Diagnosis Date Noted   CKD (chronic kidney disease) stage 4, GFR 15-29 ml/min ([place]) 07/07/2020   Chest pain 07/07/2020   Acute on chronic diastolic CHF (congestive heart failure) ([place]) 07/07/2020   Erectile dysfunction associated with type 2 diabetes mellitus ([place]) 08/07/2019   Hives 08/07/2019   Vitreous hemorrhage of left eye ([place]) 07/22/2019   Elevated troponin 07/22/2019   Vision loss of left eye 07/22/2019   History of medication noncompliance 04/02/2019   Gastric ulcer without hemorrhage or perforation 12/25/2018   CKD (chronic kidney disease), stage III ([place]) 12/25/2018   Gastroesophageal reflux disease without esophagitis 09/04/2018   Diabetic retinopathy of both eyes associated with type 2 diabetes mellitus ([place]) 01/03/2018   Intermittent diarrhea 09/27/2017   Gastroparesis 09/27/2017   Macroalbuminuric diabetic nephropathy ([place]) 06/25/2017   History of falling 06/25/2017   Hyperlipidemia 03/21/2017   Vitamin B 12 deficiency 03/21/2017   Uncontrolled type 2 diabetes mellitus with peripheral neuropathy ([place]) 02/05/2017   Essential hypertension 02/05/2017   Depression 02/05/2017   Unintended weight loss 02/05/2017   Gait disturbance 02/05/2017   Pronation deformity of both feet 05/11/2014   Diabetic neuropathy, type II diabetes mellitus ([place]) 05/11/2014   Metatarsal deformity 05/11/2014    Social History   reports that he has never smoked. He has never used smokeless tobacco. He reports that he does not drink alcohol and does not use drugs.   Family History  family  history includes Hypertension in his mother and sister; Leukemia in his maternal uncle; Pancreatic cancer in his mother; Renal cancer in his mother; Sickle cell trait in his maternal aunt; Stroke in his sister.   Medications: reviewed and updated   Objective:   Physical Exam BP (!) 157/95   Pulse 62   Temp 98.1 F (36.7 C)   Resp 18   Ht 6' 1.23" (1.86 m)    Wt 197 lb 3.2 oz (89.4 kg)   SpO2 98%   BMI 25.86 kg/m   Physical Exam HENT:     Head: Normocephalic and atraumatic.  Eyes:     Extraocular Movements: Extraocular movements intact.     Conjunctiva/sclera: Conjunctivae normal.     Pupils: Pupils are equal, round, and reactive to light.  Cardiovascular:     Rate and Rhythm: Normal rate and regular rhythm.     Pulses: Normal pulses.     Heart sounds: Normal heart sounds.  Pulmonary:     Effort: Pulmonary effort is normal.     Breath sounds: Normal breath sounds.  Abdominal:     General: Bowel sounds are normal.     Palpations: Abdomen is soft.  Musculoskeletal:     Cervical back: Normal range of motion and neck supple.  Neurological:     General: No focal deficit present.     Mental Status: He is alert and oriented to person, place, and time.  Psychiatric:        Mood and Affect: Mood normal.        Behavior: Behavior normal.     Assessment & Plan:  1. Hospital discharge follow-up: - Reviewed hospital course, current medications, ensured proper follow-up in place, and addressed concerns.  - BMP to evaluate kidney function and electrolyte balance. - CBC to screen for anemia. - Basic Metabolic Panel - CBC  2. Chest pain, unspecified type: - Resolved since hospital discharge.   3. Gastritis, presence of bleeding unspecified, unspecified chronicity, unspecified gastritis type: - Continuing stomach pain. - Continue Pantoprazole as prescribed.  - Colonoscopy last completed 05/22/2013.  - CT abdomen pelvis on 07/06/2020 resulted the appendix is well visualized and within normal limits. No obstructive or inflammatory changes of the colon are seen. Small bowel and stomach are unremarkable. - Referral to Gastroenterology for further evaluation and management.  - Ambulatory referral to Gastroenterology  4. Osteoarthritis of both knees, unspecified osteoarthritis type: - Keep appointment scheduled with Physical Therapy on  07/19/2020.   5. Type 2 diabetes mellitus with diabetic neuropathy, with long-term current use of insulin ([place]): - Hemoglobin A1c not at goal on 07/07/2020 at 7.8%, goal < 7%. This is improved from previous hemoglobin A1c of 13.3% on 07/23/2019. Next hemoglobin A1c due September 2022.  - Increase Dapagliflozin Propanediol from 5 mg daily to 10 mg daily. Patient has CKD stage 4.  - Discussed the importance of healthy eating habits, low-carbohydrate diet, low-sugar diet, regular aerobic exercise (at least 150 minutes a week as tolerated) and medication compliance to achieve or maintain control of diabetes. - To achieve an A1C goal of less than or equal to 7.0 percent, a fasting blood sugar of 80 to 130 mg/dL and a postprandial glucose (90 to 120 minutes after a meal) less than 180 mg/dL. In the event of sugars less than 60 mg/dl or greater than 400 mg/dl please notify the clinic ASAP. It is recommended that you undergo annual eye exams and annual foot exams. - Follow-up with clinical pharmacist in 4  weeks for diabetes checkup. Write your home blood sugar results down each day and bring those results to your appointment along with your home glucose monitor. Medications may be revised at that time if needed. - dapagliflozin propanediol (FARXIGA) 10 MG TABS tablet; Take 1 tablet (10 mg total) by mouth daily.  Dispense: 30 tablet; Refill: 0 - Blood Glucose Monitoring Suppl (TRUE METRIX METER) w/Device KIT; Use as directed  Dispense: 1 kit; Refill: 0 - TRUEplus Lancets 28G MISC; Use as directed  Dispense: 100 each; Refill: 4 - glucose blood (TRUE METRIX BLOOD GLUCOSE TEST) test strip; Use as instructed  Dispense: 100 each; Refill: 12  6. Essential hypertension: - Blood pressure not at goal during today's visit. Patient asymptomatic without chest pressure, chest pain, palpitations, shortness of breath, and worst headache of life. - Continue Clonidine, Metoprolol Succinate, Hydralazine, Chlorthalidone, and  Amlodipine as prescribed.  - Counseled on blood pressure goal of less than 130/80, low-sodium, DASH diet, medication compliance, 150 minutes of moderate intensity exercise per week as tolerated. Discussed medication compliance, adverse effects. - [facility] for further evaluation and management.  - Ambulatory referral to [facility] - [facility] [place] - Blood Pressure Monitor DEVI; Use as directed to check home blood pressure 2-3 times a week  Dispense: 1 each; Refill: 0  7. Acute on chronic diastolic CHF (congestive heart failure) ([place]): - Referral to [facility] for further evaluation and management.  - Ambulatory referral to [facility] - [facility] [place]  8. CKD (chronic kidney disease) stage 4, GFR 15-29 ml/min ([place]): - Referral to Nephrology for further evaluation and management. - Ambulatory referral to Nephrology  9. Benign prostatic hyperplasia, unspecified whether lower urinary tract symptoms present: - Continue Tamsulosin as prescribed.  - Per patient request referral to Urology for further evaluation and management. - Ambulatory referral to Urology  10. Scrotal swelling: - Began after having foley catheter discontinued in the hospital setting.  - Per patient request referral to Urology for further evaluation and management. - Ambulatory referral to Urology  11. Anxiety: 12. Insomnia, unspecified type - Patient denies thoughts of self-harm, suicidal ideations, and homicidal ideations.  - Patient unsure of medications he is taking for anxiety.  - Patient reports his brother manages his medications and will call back to office to give updated medication list.  - Will hold prescribing insomnia medication pending updated medication list. Patient agreeable.    Patient was given clear instructions to go to Emergency Department or return to medical center if symptoms don't improve, worsen, or  new problems develop.The patient verbalized understanding.  I discussed the assessment and treatment plan with the patient. The patient was provided an opportunity to ask questions and all were answered. The patient agreed with the plan and demonstrated an understanding of the instructions.   The patient was advised to call back or seek an in-person evaluation if the symptoms worsen or if the condition fails to improve as anticipated.    Durene Fruits, NP 07/13/2020, 5:29 PM Primary Care at [facility]

## 2020-07-13 ENCOUNTER — Ambulatory Visit (INDEPENDENT_AMBULATORY_CARE_PROVIDER_SITE_OTHER): Payer: Medicaid Other | Admitting: Family

## 2020-07-13 ENCOUNTER — Other Ambulatory Visit: Payer: Self-pay

## 2020-07-13 ENCOUNTER — Encounter: Payer: Self-pay | Admitting: Family

## 2020-07-13 VITALS — BP 157/95 | HR 62 | Temp 98.1°F | Resp 18 | Ht 73.23 in | Wt 197.2 lb

## 2020-07-13 DIAGNOSIS — G4700 Insomnia, unspecified: Secondary | ICD-10-CM

## 2020-07-13 DIAGNOSIS — I5033 Acute on chronic diastolic (congestive) heart failure: Secondary | ICD-10-CM

## 2020-07-13 DIAGNOSIS — R079 Chest pain, unspecified: Secondary | ICD-10-CM | POA: Diagnosis not present

## 2020-07-13 DIAGNOSIS — K2970 Gastritis, unspecified, without bleeding: Secondary | ICD-10-CM

## 2020-07-13 DIAGNOSIS — N184 Chronic kidney disease, stage 4 (severe): Secondary | ICD-10-CM

## 2020-07-13 DIAGNOSIS — M170 Bilateral primary osteoarthritis of knee: Secondary | ICD-10-CM

## 2020-07-13 DIAGNOSIS — Z794 Long term (current) use of insulin: Secondary | ICD-10-CM

## 2020-07-13 DIAGNOSIS — Z09 Encounter for follow-up examination after completed treatment for conditions other than malignant neoplasm: Secondary | ICD-10-CM

## 2020-07-13 DIAGNOSIS — I10 Essential (primary) hypertension: Secondary | ICD-10-CM

## 2020-07-13 DIAGNOSIS — N5089 Other specified disorders of the male genital organs: Secondary | ICD-10-CM

## 2020-07-13 DIAGNOSIS — E1140 Type 2 diabetes mellitus with diabetic neuropathy, unspecified: Secondary | ICD-10-CM

## 2020-07-13 DIAGNOSIS — F419 Anxiety disorder, unspecified: Secondary | ICD-10-CM

## 2020-07-13 DIAGNOSIS — N400 Enlarged prostate without lower urinary tract symptoms: Secondary | ICD-10-CM

## 2020-07-13 MED ORDER — SITAGLIPTIN PHOSPHATE 25 MG PO TABS: 25.0000 mg | ORAL_TABLET | Freq: Every day | ORAL | 0 refills | Status: DC

## 2020-07-13 MED ORDER — TRUEPLUS LANCETS 28G MISC: 4 refills | Status: DC

## 2020-07-13 MED ORDER — DAPAGLIFLOZIN PROPANEDIOL 10 MG PO TABS: 10.0000 mg | ORAL_TABLET | Freq: Every day | ORAL | 0 refills | Status: DC

## 2020-07-13 MED ORDER — BLOOD PRESSURE MONITOR DEVI: 0 refills | Status: DC

## 2020-07-13 MED ORDER — TRUE METRIX BLOOD GLUCOSE TEST VI STRP: ORAL_STRIP | 12 refills | Status: DC

## 2020-07-13 MED ORDER — TRUE METRIX METER W/DEVICE KIT: PACK | 0 refills | Status: DC

## 2020-07-13 NOTE — Progress Notes (Signed)
Pt presents to establish care and for hospital follow-up , pt needs blood glucose and blood pressure monitor

## 2020-07-14 ENCOUNTER — Ambulatory Visit: Payer: Medicaid Other | Admitting: Physical Therapy

## 2020-07-14 LAB — CULTURE, BODY FLUID W GRAM STAIN -BOTTLE
Culture: NO GROWTH
Culture: NO GROWTH

## 2020-07-14 LAB — BASIC METABOLIC PANEL
BUN/Creatinine Ratio: 11 (ref 10–24)
BUN: 25 mg/dL (ref 8–27)
CO2: 21 mmol/L (ref 20–29)
Calcium: 8.9 mg/dL (ref 8.6–10.2)
Chloride: 101 mmol/L (ref 96–106)
Creatinine, Ser: 2.29 mg/dL — ABNORMAL HIGH (ref 0.76–1.27)
Glucose: 233 mg/dL — ABNORMAL HIGH (ref 65–99)
Potassium: 4.2 mmol/L (ref 3.5–5.2)
Sodium: 139 mmol/L (ref 134–144)
eGFR: 32 mL/min/{1.73_m2} — ABNORMAL LOW (ref >59)

## 2020-07-14 LAB — CBC
Hematocrit: 34.6 % — ABNORMAL LOW (ref 37.5–51.0)
Hemoglobin: 10.9 g/dL — ABNORMAL LOW (ref 13.0–17.7)
MCH: 29.5 pg (ref 26.6–33.0)
MCHC: 31.5 g/dL (ref 31.5–35.7)
MCV: 94 fL (ref 79–97)
Platelets: 395 10*3/uL (ref 150–450)
RBC: 3.7 x10E6/uL — ABNORMAL LOW (ref 4.14–5.80)
RDW: 15.1 % (ref 11.6–15.4)
WBC: 7 10*3/uL (ref 3.4–10.8)

## 2020-07-14 NOTE — Progress Notes (Signed)
Kidney function slightly improved since 07/09/2020 at hospital discharge.   Anemia slightly improved since 07/08/2020 at hospital discharge.  We will continue with plan discussed during office visit 07/13/2020.

## 2020-07-19 ENCOUNTER — Ambulatory Visit: Payer: Medicaid Other

## 2020-07-20 ENCOUNTER — Other Ambulatory Visit: Payer: Self-pay

## 2020-07-20 ENCOUNTER — Ambulatory Visit: Payer: Self-pay | Attending: Family Medicine | Admitting: Pharmacist

## 2020-07-20 ENCOUNTER — Encounter: Payer: Self-pay | Admitting: Pharmacist

## 2020-07-20 DIAGNOSIS — E1140 Type 2 diabetes mellitus with diabetic neuropathy, unspecified: Secondary | ICD-10-CM

## 2020-07-20 DIAGNOSIS — R1110 Vomiting, unspecified: Secondary | ICD-10-CM

## 2020-07-20 DIAGNOSIS — Z794 Long term (current) use of insulin: Secondary | ICD-10-CM

## 2020-07-20 MED ORDER — CLONIDINE 0.1 MG/24HR TD PTWK
0.1000 mg | MEDICATED_PATCH | TRANSDERMAL | 2 refills | Status: DC
  Filled 2020-07-20: qty 4, 28d supply, fill #0
  Filled 2020-08-30: qty 4, 28d supply, fill #1

## 2020-07-20 MED ORDER — CHLORTHALIDONE 25 MG PO TABS
12.5000 mg | ORAL_TABLET | ORAL | 1 refills | Status: DC
  Filled 2020-07-20: qty 15, 60d supply, fill #0
  Filled 2020-09-13: qty 15, 60d supply, fill #1

## 2020-07-20 MED ORDER — TRAZODONE HCL 50 MG PO TABS
150.0000 mg | ORAL_TABLET | Freq: Every day | ORAL | 2 refills | Status: DC
  Filled 2020-07-20: qty 90, 30d supply, fill #0
  Filled 2020-08-30: qty 90, 30d supply, fill #1

## 2020-07-20 MED ORDER — TRUE METRIX METER W/DEVICE KIT
PACK | 0 refills | Status: DC
  Filled 2020-07-20: qty 1, 365d supply, fill #0

## 2020-07-20 MED ORDER — TAMSULOSIN HCL 0.4 MG PO CAPS
0.4000 mg | ORAL_CAPSULE | Freq: Every day | ORAL | 2 refills | Status: DC
  Filled 2020-07-20: qty 30, 30d supply, fill #0
  Filled 2020-08-30: qty 30, 30d supply, fill #1

## 2020-07-20 MED ORDER — INSULIN DETEMIR 100 UNIT/ML ~~LOC~~ SOLN
10.0000 [IU] | Freq: Every day | SUBCUTANEOUS | 2 refills | Status: DC
  Filled 2020-07-20: qty 10, 28d supply, fill #0
  Filled 2020-08-17: qty 10, 28d supply, fill #1

## 2020-07-20 MED ORDER — AMLODIPINE BESYLATE 10 MG PO TABS
10.0000 mg | ORAL_TABLET | Freq: Every day | ORAL | 2 refills | Status: DC
  Filled 2020-07-20: qty 30, 30d supply, fill #0
  Filled 2020-08-17: qty 30, 30d supply, fill #1
  Filled 2020-09-22: qty 30, 30d supply, fill #2

## 2020-07-20 MED ORDER — FINASTERIDE 5 MG PO TABS
5.0000 mg | ORAL_TABLET | Freq: Every day | ORAL | 2 refills | Status: DC
  Filled 2020-07-20: qty 30, 30d supply, fill #0
  Filled 2020-08-17 (×2): qty 30, 30d supply, fill #1
  Filled 2020-09-13: qty 30, 30d supply, fill #2

## 2020-07-20 MED ORDER — "INSULIN SYRINGE-NEEDLE U-100 31G X 5/16"" 0.3 ML MISC"
0 refills | Status: DC
  Filled 2020-07-20: qty 100, 30d supply, fill #0

## 2020-07-20 MED ORDER — ONDANSETRON HCL 4 MG PO TABS
4.0000 mg | ORAL_TABLET | Freq: Three times a day (TID) | ORAL | 0 refills | Status: DC | PRN
  Filled 2020-07-20: qty 20, 7d supply, fill #0

## 2020-07-20 MED ORDER — METOPROLOL SUCCINATE ER 100 MG PO TB24
150.0000 mg | ORAL_TABLET | Freq: Every evening | ORAL | 2 refills | Status: DC
  Filled 2020-07-20: qty 45, 30d supply, fill #0
  Filled 2020-08-17: qty 45, 30d supply, fill #1
  Filled 2020-09-13: qty 45, 30d supply, fill #2

## 2020-07-20 MED ORDER — TRUE METRIX BLOOD GLUCOSE TEST VI STRP
ORAL_STRIP | 12 refills | Status: DC
  Filled 2020-07-20: qty 100, 25d supply, fill #0

## 2020-07-20 MED ORDER — NORTRIPTYLINE HCL 25 MG PO CAPS
25.0000 mg | ORAL_CAPSULE | Freq: Every day | ORAL | 2 refills | Status: DC
  Filled 2020-07-20: qty 30, 30d supply, fill #0
  Filled 2020-08-17: qty 30, 30d supply, fill #1
  Filled 2020-09-13: qty 30, 30d supply, fill #2

## 2020-07-20 MED ORDER — ATORVASTATIN CALCIUM 40 MG PO TABS
40.0000 mg | ORAL_TABLET | Freq: Every day | ORAL | 2 refills | Status: DC
  Filled 2020-07-20: qty 30, 30d supply, fill #0
  Filled 2020-08-17: qty 30, 30d supply, fill #1
  Filled 2020-09-22: qty 30, 30d supply, fill #2

## 2020-07-20 MED ORDER — HYDRALAZINE HCL 100 MG PO TABS
100.0000 mg | ORAL_TABLET | Freq: Three times a day (TID) | ORAL | 2 refills | Status: DC
  Filled 2020-07-20: qty 90, 30d supply, fill #0
  Filled 2020-08-17: qty 90, 30d supply, fill #1
  Filled 2020-09-13: qty 90, 30d supply, fill #2

## 2020-07-20 MED ORDER — DAPAGLIFLOZIN PROPANEDIOL 10 MG PO TABS
10.0000 mg | ORAL_TABLET | Freq: Every day | ORAL | 0 refills | Status: AC
  Filled 2020-07-20: qty 30, 30d supply, fill #0

## 2020-07-20 MED ORDER — ESCITALOPRAM OXALATE 20 MG PO TABS
20.0000 mg | ORAL_TABLET | Freq: Every day | ORAL | 3 refills | Status: DC
  Filled 2020-07-20: qty 30, 30d supply, fill #0
  Filled 2020-08-17: qty 30, 30d supply, fill #1
  Filled 2020-09-22: qty 30, 30d supply, fill #2

## 2020-07-20 MED ORDER — CYCLOBENZAPRINE HCL 5 MG PO TABS
5.0000 mg | ORAL_TABLET | Freq: Two times a day (BID) | ORAL | 0 refills | Status: DC | PRN
  Filled 2020-07-20: qty 15, 8d supply, fill #0

## 2020-07-20 MED ORDER — TADALAFIL 5 MG PO TABS
5.0000 mg | ORAL_TABLET | Freq: Every day | ORAL | 2 refills | Status: DC
  Filled 2020-07-20: qty 30, 30d supply, fill #0
  Filled 2020-08-17 – 2020-08-30 (×2): qty 30, 30d supply, fill #1
  Filled 2020-09-13: qty 10, 30d supply, fill #1

## 2020-07-20 MED ORDER — HYDRALAZINE HCL 25 MG PO TABS
25.0000 mg | ORAL_TABLET | Freq: Three times a day (TID) | ORAL | 2 refills | Status: DC
  Filled 2020-07-20: qty 90, 30d supply, fill #0
  Filled 2020-08-17: qty 90, 30d supply, fill #1
  Filled 2020-09-13: qty 90, 30d supply, fill #2

## 2020-07-20 MED ORDER — TRUEPLUS LANCETS 28G MISC
4 refills | Status: DC
  Filled 2020-07-20: qty 100, 25d supply, fill #0

## 2020-07-20 NOTE — Progress Notes (Signed)
     S:    PCP: Dr. Wynetta Emery   No chief complaint on file.  Patient arrives in good spirits.  Presents for diabetes management at the request of Amy Minette Brine who saw the pt on 6/296/2022. His Farxiga dose was increased at that visit.   Today, he reports he did not get the [place] d/t cost. Michela Pitcher that his copay was $600. Additionally, he is not taking any medication except pantoprazole and amlodipine. He tells me he wanted to bring me  his list to make sure he needed to be on everything.    Patient reports diabetes was diagnosed >20 yrs ago. He has been on basal-bolus insulin previously but only has [place] and Levemir on his profile now. His kidney function is contraindicative to metformin use.   Family/Social History:  - FHx: negative for DM - Tobacco: never smoker  - Alcohol: no  Insurance coverage/medication affordability: self-pay  Patient denies adherence with medications. Current diabetes medications include:  - Dapagliflozin 10 mg daily (not taking d/t cost) - Levemir 10u daily (not taking)  Patient denies hypoglycemic events.  Patient reported dietary habits:  -Intake is poor d/t GERD symptoms and nausea  Patient-reported exercise habits: -Limited   Patient denies polyuria, polydipsia.  Patient reports worsening neuropathy. Patient reports visual changes. Patient reports self foot exams.   O:  POCT glucose: 260 - fasting   Home fasting CBG: not checking consistently - requests new meter  Lab Results  Component Value Date   HGBA1C 7.8 (H) 07/07/2020   There were no vitals filed for this visit.  Lipid Panel     Component Value Date/Time   CHOL 172 02/25/2018 1657   TRIG 158 (H) 02/25/2018 1657   HDL 39 (L) 02/25/2018 1657   CHOLHDL 4.4 02/25/2018 1657   LDLCALC 101 (H) 02/25/2018 1657   Clinical ASCVD: No  The ASCVD Risk score Mikey Bussing DC Jr., et al., 2013) failed to calculate for the following reasons:   The valid total cholesterol range is 130 to 320  mg/dL   A/P: Diabetes longstanding currently uncontrolled. Patient is able to verbalize appropriate hypoglycemia management plan. Patient is not adherent with medication. I have advised him to pick up the Levemir and new meter. He is to begin 10units at bedtime, check his home CBGs and return in 2-3 weeks for reassessment. I have resent his Wilder Glade to our pharmacy to see if we can assist him in obtaining this. Of note, his recent kidney function has shown improvement but is poor. We will have to monitor this closely with the Farxiga.  -Start Levemir 10 units daily.  -Resume Farxiga 10mg  daily.   -Extensively discussed pathophysiology of DM, recommended lifestyle interventions, dietary effects on glycemic control -Counseled on s/sx of and management of hypoglycemia -Next A1c anticipated 09/2020.  ASCVD risk - primary prevention in patient with DM. Needs updated lipid panel. D/t renal function I recommend changing rosuvastatin to atorvastatin. Recommend lipid panel at follow-up.  -Stop rosuvastatin. -Start atorvastatin 40 mg daily.   Of note, I have refilled all medications that were continued S/p hospital discharge and that were continued by Amy. I have sent these to our pharmacy to assist with access. I have removed meds he is not taking.   Written patient instructions provided. Total time in face to face counseling 45 minutes.   Follow up w/ me in 2-3 weeks.   Benard Halsted, PharmD, [facility] [place], [place] [phone]

## 2020-07-21 ENCOUNTER — Ambulatory Visit: Payer: Medicaid Other | Attending: Family Medicine

## 2020-07-21 ENCOUNTER — Other Ambulatory Visit: Payer: Self-pay

## 2020-07-26 ENCOUNTER — Other Ambulatory Visit: Payer: Self-pay

## 2020-08-02 ENCOUNTER — Ambulatory Visit: Payer: Self-pay | Admitting: Pharmacist

## 2020-08-03 ENCOUNTER — Other Ambulatory Visit: Payer: Self-pay

## 2020-08-03 ENCOUNTER — Observation Stay (HOSPITAL_COMMUNITY)
Admission: EM | Admit: 2020-08-03 | Discharge: 2020-08-04 | Disposition: A | Discharge status: Home / Self Care | Payer: Medicaid Other | Attending: Internal Medicine | Admitting: Internal Medicine

## 2020-08-03 ENCOUNTER — Emergency Department (HOSPITAL_COMMUNITY): Payer: Medicaid Other

## 2020-08-03 ENCOUNTER — Encounter (HOSPITAL_COMMUNITY): Payer: Self-pay | Admitting: Emergency Medicine

## 2020-08-03 DIAGNOSIS — N184 Chronic kidney disease, stage 4 (severe): Secondary | ICD-10-CM | POA: Diagnosis not present

## 2020-08-03 DIAGNOSIS — Z91040 Latex allergy status: Secondary | ICD-10-CM | POA: Insufficient documentation

## 2020-08-03 DIAGNOSIS — Z79899 Other long term (current) drug therapy: Secondary | ICD-10-CM | POA: Diagnosis not present

## 2020-08-03 DIAGNOSIS — R2240 Localized swelling, mass and lump, unspecified lower limb: Secondary | ICD-10-CM | POA: Insufficient documentation

## 2020-08-03 DIAGNOSIS — R0602 Shortness of breath: Secondary | ICD-10-CM | POA: Diagnosis present

## 2020-08-03 DIAGNOSIS — Z9181 History of falling: Secondary | ICD-10-CM

## 2020-08-03 DIAGNOSIS — I5033 Acute on chronic diastolic (congestive) heart failure: Secondary | ICD-10-CM | POA: Diagnosis not present

## 2020-08-03 DIAGNOSIS — E1122 Type 2 diabetes mellitus with diabetic chronic kidney disease: Secondary | ICD-10-CM | POA: Insufficient documentation

## 2020-08-03 DIAGNOSIS — I130 Hypertensive heart and chronic kidney disease with heart failure and stage 1 through stage 4 chronic kidney disease, or unspecified chronic kidney disease: Secondary | ICD-10-CM | POA: Diagnosis not present

## 2020-08-03 DIAGNOSIS — IMO0002 Reserved for concepts with insufficient information to code with codable children: Secondary | ICD-10-CM

## 2020-08-03 DIAGNOSIS — Z20822 Contact with and (suspected) exposure to covid-19: Secondary | ICD-10-CM | POA: Diagnosis not present

## 2020-08-03 DIAGNOSIS — I5031 Acute diastolic (congestive) heart failure: Secondary | ICD-10-CM

## 2020-08-03 DIAGNOSIS — Z794 Long term (current) use of insulin: Secondary | ICD-10-CM | POA: Insufficient documentation

## 2020-08-03 DIAGNOSIS — I509 Heart failure, unspecified: Secondary | ICD-10-CM

## 2020-08-03 DIAGNOSIS — E1142 Type 2 diabetes mellitus with diabetic polyneuropathy: Secondary | ICD-10-CM

## 2020-08-03 LAB — RESP PANEL BY RT-PCR (FLU A&B, COVID) ARPGX2
Influenza A by PCR: NEGATIVE
Influenza B by PCR: NEGATIVE
SARS Coronavirus 2 by RT PCR: NEGATIVE

## 2020-08-03 LAB — CBC
HCT: 30.4 % — ABNORMAL LOW (ref 39.0–52.0)
Hemoglobin: 9.7 g/dL — ABNORMAL LOW (ref 13.0–17.0)
MCH: 29.9 pg (ref 26.0–34.0)
MCHC: 31.9 g/dL (ref 30.0–36.0)
MCV: 93.8 fL (ref 80.0–100.0)
Platelets: 325 10*3/uL (ref 150–400)
RBC: 3.24 MIL/uL — ABNORMAL LOW (ref 4.22–5.81)
RDW: 14.9 % (ref 11.5–15.5)
WBC: 6.2 10*3/uL (ref 4.0–10.5)
nRBC: 0 % (ref 0.0–0.2)

## 2020-08-03 LAB — BASIC METABOLIC PANEL
Anion gap: 11 (ref 5–15)
BUN: 36 mg/dL — ABNORMAL HIGH (ref 6–20)
CO2: 22 mmol/L (ref 22–32)
Calcium: 8.8 mg/dL — ABNORMAL LOW (ref 8.9–10.3)
Chloride: 104 mmol/L (ref 98–111)
Creatinine, Ser: 2.65 mg/dL — ABNORMAL HIGH (ref 0.61–1.24)
GFR, Estimated: 27 mL/min — ABNORMAL LOW (ref >60)
Glucose, Bld: 357 mg/dL — ABNORMAL HIGH (ref 70–99)
Potassium: 3.9 mmol/L (ref 3.5–5.1)
Sodium: 137 mmol/L (ref 135–145)

## 2020-08-03 LAB — HEPATIC FUNCTION PANEL
ALT: 20 U/L (ref 0–44)
AST: 19 U/L (ref 15–41)
Albumin: 3.4 g/dL — ABNORMAL LOW (ref 3.5–5.0)
Alkaline Phosphatase: 137 U/L — ABNORMAL HIGH (ref 38–126)
Bilirubin, Direct: 0.1 mg/dL (ref 0.0–0.2)
Indirect Bilirubin: 0.8 mg/dL (ref 0.3–0.9)
Total Bilirubin: 0.9 mg/dL (ref 0.3–1.2)
Total Protein: 7.2 g/dL (ref 6.5–8.1)

## 2020-08-03 LAB — MAGNESIUM: Magnesium: 2 mg/dL (ref 1.7–2.4)

## 2020-08-03 LAB — HIV ANTIBODY (ROUTINE TESTING W REFLEX): HIV Screen 4th Generation wRfx: NONREACTIVE

## 2020-08-03 LAB — BRAIN NATRIURETIC PEPTIDE: B Natriuretic Peptide: 552.3 pg/mL — ABNORMAL HIGH (ref 0.0–100.0)

## 2020-08-03 LAB — GLUCOSE, CAPILLARY: Glucose-Capillary: 321 mg/dL — ABNORMAL HIGH (ref 70–99)

## 2020-08-03 LAB — CBG MONITORING, ED: Glucose-Capillary: 257 mg/dL — ABNORMAL HIGH (ref 70–99)

## 2020-08-03 LAB — TROPONIN I (HIGH SENSITIVITY)
Troponin I (High Sensitivity): 28 ng/L — ABNORMAL HIGH (ref <18)
Troponin I (High Sensitivity): 27 ng/L — ABNORMAL HIGH (ref <18)

## 2020-08-03 MED ORDER — CLONIDINE HCL 0.1 MG/24HR TD PTWK
0.1000 mg | MEDICATED_PATCH | TRANSDERMAL | Status: DC
  Administered 2020-08-04: 0.1 mg via TRANSDERMAL
  Filled 2020-08-03: qty 1

## 2020-08-03 MED ORDER — CHLORTHALIDONE 25 MG PO TABS
12.5000 mg | ORAL_TABLET | ORAL | Status: DC
  Filled 2020-08-03 (×2): qty 0.5

## 2020-08-03 MED ORDER — HEPARIN SODIUM (PORCINE) 5000 UNIT/ML IJ SOLN
5000.0000 [IU] | Freq: Three times a day (TID) | INTRAMUSCULAR | Status: DC
  Administered 2020-08-03 – 2020-08-04 (×2): 5000 [IU] via SUBCUTANEOUS
  Filled 2020-08-03 (×2): qty 1

## 2020-08-03 MED ORDER — PANTOPRAZOLE SODIUM 40 MG PO TBEC
40.0000 mg | DELAYED_RELEASE_TABLET | Freq: Every day | ORAL | Status: DC
  Administered 2020-08-04: 40 mg via ORAL
  Filled 2020-08-03: qty 1

## 2020-08-03 MED ORDER — ESCITALOPRAM OXALATE 10 MG PO TABS
20.0000 mg | ORAL_TABLET | Freq: Every day | ORAL | Status: DC
  Administered 2020-08-04: 20 mg via ORAL
  Filled 2020-08-03: qty 2

## 2020-08-03 MED ORDER — INSULIN ASPART 100 UNIT/ML IJ SOLN
0.0000 [IU] | Freq: Three times a day (TID) | INTRAMUSCULAR | Status: DC
  Administered 2020-08-04: 2 [IU] via SUBCUTANEOUS

## 2020-08-03 MED ORDER — HYDRALAZINE HCL 25 MG PO TABS: 25.0000 mg | ORAL_TABLET | Freq: Three times a day (TID) | ORAL | Status: DC

## 2020-08-03 MED ORDER — TRAZODONE HCL 50 MG PO TABS
150.0000 mg | ORAL_TABLET | Freq: Every day | ORAL | Status: DC
  Administered 2020-08-03: 150 mg via ORAL
  Filled 2020-08-03: qty 3

## 2020-08-03 MED ORDER — TAMSULOSIN HCL 0.4 MG PO CAPS
0.4000 mg | ORAL_CAPSULE | Freq: Every day | ORAL | Status: DC
  Administered 2020-08-04: 0.4 mg via ORAL
  Filled 2020-08-03: qty 1

## 2020-08-03 MED ORDER — ONDANSETRON 4 MG PO TBDP
4.0000 mg | ORAL_TABLET | Freq: Once | ORAL | Status: AC
  Administered 2020-08-03: 4 mg via ORAL
  Filled 2020-08-03: qty 1

## 2020-08-03 MED ORDER — FUROSEMIDE 10 MG/ML IJ SOLN
40.0000 mg | Freq: Once | INTRAMUSCULAR | Status: AC
  Administered 2020-08-03: 40 mg via INTRAVENOUS
  Filled 2020-08-03: qty 4

## 2020-08-03 MED ORDER — METOPROLOL SUCCINATE ER 50 MG PO TB24
150.0000 mg | ORAL_TABLET | Freq: Every day | ORAL | Status: DC
  Administered 2020-08-03 – 2020-08-04 (×2): 150 mg via ORAL
  Filled 2020-08-03 (×2): qty 1

## 2020-08-03 MED ORDER — TRAMADOL HCL 50 MG PO TABS
50.0000 mg | ORAL_TABLET | Freq: Two times a day (BID) | ORAL | Status: DC
  Administered 2020-08-03 – 2020-08-04 (×2): 50 mg via ORAL
  Filled 2020-08-03 (×2): qty 1

## 2020-08-03 MED ORDER — FINASTERIDE 5 MG PO TABS
5.0000 mg | ORAL_TABLET | Freq: Every day | ORAL | Status: DC
  Administered 2020-08-03 – 2020-08-04 (×2): 5 mg via ORAL
  Filled 2020-08-03 (×2): qty 1

## 2020-08-03 MED ORDER — SODIUM CHLORIDE 0.9% FLUSH: 3.0000 mL | INTRAVENOUS | Status: DC | PRN

## 2020-08-03 MED ORDER — ACETAMINOPHEN 325 MG PO TABS: 650.0000 mg | ORAL_TABLET | ORAL | Status: DC | PRN

## 2020-08-03 MED ORDER — AMLODIPINE BESYLATE 5 MG PO TABS
10.0000 mg | ORAL_TABLET | Freq: Every day | ORAL | Status: DC
  Administered 2020-08-04: 10 mg via ORAL
  Filled 2020-08-03: qty 2

## 2020-08-03 MED ORDER — SODIUM CHLORIDE 0.9 % IV SOLN: 250.0000 mL | INTRAVENOUS | Status: DC | PRN

## 2020-08-03 MED ORDER — ATORVASTATIN CALCIUM 40 MG PO TABS
40.0000 mg | ORAL_TABLET | Freq: Every day | ORAL | Status: DC
  Administered 2020-08-04: 40 mg via ORAL
  Filled 2020-08-03: qty 1

## 2020-08-03 MED ORDER — HYDRALAZINE HCL 50 MG PO TABS
125.0000 mg | ORAL_TABLET | Freq: Three times a day (TID) | ORAL | Status: DC
  Administered 2020-08-03 – 2020-08-04 (×2): 125 mg via ORAL
  Filled 2020-08-03 (×3): qty 3

## 2020-08-03 MED ORDER — SODIUM CHLORIDE 0.9% FLUSH
3.0000 mL | Freq: Two times a day (BID) | INTRAVENOUS | Status: DC
  Administered 2020-08-03: 3 mL via INTRAVENOUS

## 2020-08-03 MED ORDER — INSULIN DETEMIR 100 UNIT/ML ~~LOC~~ SOLN
10.0000 [IU] | Freq: Every day | SUBCUTANEOUS | Status: DC
  Administered 2020-08-03 – 2020-08-04 (×2): 10 [IU] via SUBCUTANEOUS
  Filled 2020-08-03 (×2): qty 0.1

## 2020-08-03 MED ORDER — INSULIN ASPART 100 UNIT/ML IJ SOLN
0.0000 [IU] | Freq: Every day | INTRAMUSCULAR | Status: DC
Start: 2020-08-03 — End: 2020-08-04
  Administered 2020-08-03: 4 [IU] via SUBCUTANEOUS

## 2020-08-03 MED ORDER — NORTRIPTYLINE HCL 25 MG PO CAPS
25.0000 mg | ORAL_CAPSULE | Freq: Every day | ORAL | Status: DC
  Administered 2020-08-03: 25 mg via ORAL
  Filled 2020-08-03 (×2): qty 1

## 2020-08-03 MED ORDER — ONDANSETRON HCL 4 MG/2ML IJ SOLN: 4.0000 mg | Freq: Four times a day (QID) | INTRAMUSCULAR | Status: DC | PRN

## 2020-08-03 MED ORDER — DAPAGLIFLOZIN PROPANEDIOL 10 MG PO TABS: 10.0000 mg | ORAL_TABLET | Freq: Every day | ORAL | Status: DC

## 2020-08-03 NOTE — H&P (Signed)
History and Physical    Ryan Jenkins WHQ:[id number] DOB: Jun 11, 1960 DOA: 08/03/2020  PCP: Pcp, No  Patient coming from: Home  I have personally briefly reviewed patient's old medical records in [place]  Chief Complaint: Shortness of breath/leg swelling  HPI: Ryan Jenkins is a 60 y.o. male with medical history significant of DM2 with neuropathy, HTN, CKD 4, suspected non-adherence to medication / diet regimen presented to ED with complaint of 3 days duration of dry cough, shortness of breath and bilateral lower extremity swelling.  Shortness of breath is mostly exertional.  He cannot verify if he has any nocturnal dyspnea.  No other complaints such as fever, chills, sweating, any problem with urination or bowel bowel movements.  Patient has recently moved to [facility] about couple of months ago.  He is in the process of establishing care with different physicians.  He has upcoming appointment with his GI doctor in August due to chronic epigastric pain, likely gastritis and has appointment to see cardiologist in 2 days.  No recent travel or sick contact.  He is fully vaccinated for COVID-19 according to him.  ED Course: Upon arrival to ED, he was hemodynamically stable.  In fact saturating 100% on room air.  Chest x-ray showed interstitial edema.  Renal function at his baseline.  BNP slightly elevated than his baseline.  Patient did not receive any diuretics.  Hospitalist were called to admit the patient for acute on chronic congestive heart failure.  COVID test is pending.  Review of Systems: As per HPI otherwise negative.    Past Medical History:  Diagnosis Date   Abdominal pain 11/25/2018   Acid reflux 11/25/2018   Anxiety    Bilateral swelling of feet and ankles 11/25/2018   and legs   Change in bowel habits 11/25/2018   Constipation 11/25/2018   Depression    Diabetes mellitus without complication ([place])    Diabetic neuropathy ([place])    Diarrhea 11/25/2018    Fatigue 11/25/2018   GERD (gastroesophageal reflux disease)    Hypertension    Loss of appetite 11/25/2018   Muscle spasm 11/25/2018   Neuromuscular disorder ([place])    neuropathy   Retinopathy due to secondary diabetes [facility])    Sleep disturbances 11/25/2018   Vision changes 11/25/2018   Vitamin B12 deficiency     Past Surgical History:  Procedure Laterality Date   CATARACT EXTRACTION Bilateral 2017     reports that he has never smoked. He has never used smokeless tobacco. He reports that he does not drink alcohol and does not use drugs.  Allergies  Allergen Reactions   Claritin [Loratadine] Swelling    Joint swelling    Hydrochlorothiazide Other (See Comments)    Dizziness   Latex Hives   Lyrica [Pregabalin] Other (See Comments)    depression   Metformin And Related Other (See Comments)    GI    Family History  Problem Relation Age of Onset   Renal cancer Mother    Hypertension Mother    Pancreatic cancer Mother    Hypertension Sister    Stroke Sister    Leukemia Maternal Uncle    Sickle cell trait Maternal Aunt    Colon cancer Neg Hx    Esophageal cancer Neg Hx    Rectal cancer Neg Hx    Stomach cancer Neg Hx     Prior to Admission medications   Medication Sig Start Date End Date Taking? Authorizing Provider  acetaminophen (TYLENOL) 500 MG tablet  Take 1,000 mg by mouth in the morning and at bedtime.    [provider]  amLODipine (NORVASC) 10 MG tablet Take 1 tablet (10 mg total) by mouth daily. 07/20/20 08/19/20  Charlott Rakes, MD  atorvastatin (LIPITOR) 40 MG tablet Take 1 tablet (40 mg total) by mouth daily. 07/20/20   Charlott Rakes, MD  Blood Glucose Monitoring Suppl (TRUE METRIX METER) w/Device KIT Use as directed 07/20/20   Ladell Pier, MD  Blood Pressure Monitor DEVI Use as directed to check home blood pressure 2-3 times a week 07/13/20   Camillia Herter, NP  chlorthalidone (HYGROTON) 25 MG tablet Take 0.5 tablets (12.5 mg total) by mouth  every other day. 07/20/20   Charlott Rakes, MD  cloNIDine (CATAPRES - DOSED IN MG/24 HR) 0.1 mg/24hr patch Place 1 patch (0.1 mg total) onto the skin once a week. 07/20/20   Charlott Rakes, MD  cyclobenzaprine (FLEXERIL) 5 MG tablet Take 1 tablet (5 mg total) by mouth 2 (two) times daily as needed for muscle spasms (medication can cause drowsiness). 07/20/20   Ladell Pier, MD  D-Mannose 350 MG CAPS Take 1,050 capsules by mouth in the morning and at bedtime.    [provider]  dapagliflozin propanediol (FARXIGA) 10 MG TABS tablet Take 1 tablet (10 mg total) by mouth daily. 07/20/20 08/19/20  Charlott Rakes, MD  escitalopram (LEXAPRO) 20 MG tablet Take 1 tablet (20 mg total) by mouth daily. 07/20/20   Charlott Rakes, MD  finasteride (PROSCAR) 5 MG tablet Take 1 tablet (5 mg total) by mouth daily. 07/20/20   Charlott Rakes, MD  glucose blood (TRUE METRIX BLOOD GLUCOSE TEST) test strip Use as instructed 07/20/20   Ladell Pier, MD  hydrALAZINE (APRESOLINE) 100 MG tablet Take 1 tablet (100 mg total) by mouth 3 (three) times daily. 07/20/20   Charlott Rakes, MD  hydrALAZINE (APRESOLINE) 25 MG tablet Take 1 tablet (25 mg total) by mouth 3 (three) times daily. 07/20/20   Charlott Rakes, MD  insulin detemir (LEVEMIR) 100 UNIT/ML injection Inject 0.1 mLs (10 Units total) into the skin daily. 07/20/20   Charlott Rakes, MD  Insulin Syringe-Needle U-100 (TRUEPLUS INSULIN SYRINGE) 31G X 5/16" 0.3 ML MISC Use to inject Levemir at bedtime. 07/20/20   Charlott Rakes, MD  loperamide (IMODIUM) 2 MG capsule Take 2 mg by mouth See admin instructions. Give 1 tablet by mouth every 2 hours as needed for each loose stool not to exceed 33m in 24 hours.    [provider]  metoprolol succinate (TOPROL-XL) 100 MG 24 hr tablet Take 1.5 tablets (150 mg total) by mouth at bedtime. Take with or immediately following a meal. 07/20/20   NCharlott Rakes MD  Multiple Vitamins-Minerals (MULTIVITAMIN WITH MINERALS) tablet  Take 1 tablet by mouth daily.    [provider]  nortriptyline (PAMELOR) 25 MG capsule Take 1 capsule (25 mg total) by mouth at bedtime. 07/20/20   NCharlott Rakes MD  ondansetron (ZOFRAN) 4 MG tablet Take 1 tablet (4 mg total) by mouth every 8 (eight) hours as needed for nausea or vomiting. 07/20/20   NCharlott Rakes MD  pantoprazole (PROTONIX) 40 MG tablet Take 1 tablet (40 mg total) by mouth daily. 07/09/20 08/08/20  PDarliss Cheney MD  tadalafil (CIALIS) 5 MG tablet Take 1 tablet (5 mg total) by mouth daily. 07/20/20   NCharlott Rakes MD  tamsulosin (FLOMAX) 0.4 MG CAPS capsule Take 1 capsule (0.4 mg total) by mouth daily after breakfast. 07/20/20  Charlott Rakes, MD  traMADol (ULTRAM) 50 MG tablet Take 50 mg by mouth 2 (two) times daily.    [provider]  traZODone (DESYREL) 50 MG tablet Take 3 tablets (150 mg total) by mouth at bedtime. 07/20/20   Charlott Rakes, MD  TRUEplus Lancets 28G MISC Use as directed 07/20/20   Ladell Pier, MD    Physical Exam: Vitals:   08/03/20 1133 08/03/20 1407 08/03/20 1710  BP: (!) 153/74 (!) 159/73 (!) 145/86  Pulse: 83 77 87  Resp: 16 14 15   Temp: 97.9 F (36.6 C)    TempSrc: Oral    SpO2: 95% 100% 98%    Constitutional: NAD, calm, comfortable Vitals:   08/03/20 1133 08/03/20 1407 08/03/20 1710  BP: (!) 153/74 (!) 159/73 (!) 145/86  Pulse: 83 77 87  Resp: 16 14 15   Temp: 97.9 F (36.6 C)    TempSrc: Oral    SpO2: 95% 100% 98%   Eyes: PERRL, lids and conjunctivae normal ENMT: Mucous membranes are moist. Posterior pharynx clear of any exudate or lesions.Normal dentition.  Neck: normal, supple, no masses, no thyromegaly Respiratory: clear to auscultation bilaterally, no wheezing, no crackles. Normal respiratory effort. No accessory muscle use.  Cardiovascular: Regular rate and rhythm, no murmurs / rubs / gallops.  +1 pitting edema bilateral lower extremity, 2+ pedal pulses. No carotid bruits.  Abdomen: no tenderness, no  masses palpated. No hepatosplenomegaly. Bowel sounds positive.  Musculoskeletal: no clubbing / cyanosis. No joint deformity upper and lower extremities. Good ROM, no contractures. Normal muscle tone.  Skin: no rashes, lesions, ulcers. No induration Neurologic: CN 2-12 grossly intact. Sensation intact, DTR normal. Strength 5/5 in all 4.  Psychiatric: Normal judgment and insight. Alert and oriented x 3. Normal mood.    Labs on Admission: I have personally reviewed following labs and imaging studies  CBC: Recent Labs  Lab 08/03/20 1155  WBC 6.2  HGB 9.7*  HCT 30.4*  MCV 93.8  PLT 791   Basic Metabolic Panel: Recent Labs  Lab 08/03/20 1155  NA 137  K 3.9  CL 104  CO2 22  GLUCOSE 357*  BUN 36*  CREATININE 2.65*  CALCIUM 8.8*   GFR: CrCl cannot be calculated (Unknown ideal weight.). Liver Function Tests: Recent Labs  Lab 08/03/20 1155  AST 19  ALT 20  ALKPHOS 137*  BILITOT 0.9  PROT 7.2  ALBUMIN 3.4*   No results for input(s): LIPASE, AMYLASE in the last 168 hours. No results for input(s): AMMONIA in the last 168 hours. Coagulation Profile: No results for input(s): INR, PROTIME in the last 168 hours. Cardiac Enzymes: No results for input(s): CKTOTAL, CKMB, CKMBINDEX, TROPONINI in the last 168 hours. BNP (last 3 results) No results for input(s): PROBNP in the last 8760 hours. HbA1C: No results for input(s): HGBA1C in the last 72 hours. CBG: No results for input(s): GLUCAP in the last 168 hours. Lipid Profile: No results for input(s): CHOL, HDL, LDLCALC, TRIG, CHOLHDL, LDLDIRECT in the last 72 hours. Thyroid Function Tests: No results for input(s): TSH, T4TOTAL, FREET4, T3FREE, THYROIDAB in the last 72 hours. Anemia Panel: No results for input(s): VITAMINB12, FOLATE, FERRITIN, TIBC, IRON, RETICCTPCT in the last 72 hours. Urine analysis:    Component Value Date/Time   COLORURINE STRAW (A) 07/22/2019 2000   APPEARANCEUR CLEAR 07/22/2019 2000   LABSPEC 1.013  07/22/2019 2000   PHURINE 6.0 07/22/2019 2000   GLUCOSEU >=500 (A) 07/22/2019 2000   HGBUR SMALL (A) 07/22/2019 2000  BILIRUBINUR NEGATIVE 07/22/2019 [place] NEGATIVE 07/22/2019 2000   PROTEINUR >=300 (A) 07/22/2019 2000   NITRITE NEGATIVE 07/22/2019 2000   LEUKOCYTESUR NEGATIVE 07/22/2019 2000    Radiological Exams on Admission: DG Chest 2 View  Result Date: 08/03/2020 CLINICAL DATA:  Shortness of breath EXAM: CHEST - 2 VIEW COMPARISON:  07/06/2020 FINDINGS: Symmetric interstitial prominence with a few suspected Kerley lines and fissure thickening. No pleural effusion or consolidation. No pneumothorax. Normal heart size and mediastinal contours. IMPRESSION: Early interstitial edema. Electronically Signed   By: Monte Fantasia M.D.   On: 08/03/2020 11:58    EKG: Independently reviewed.  Normal sinus rhythm with no acute ST-T wave changes.  Assessment/Plan Active Problems:   Acute CHF (congestive heart failure) (HCC)   Acute on chronic diastolic congestive heart failure: Patient has echo done recently a month ago which showed normal ejection fraction with grade 1 diastolic dysfunction.  Chest x-ray with interstitial edema.  Last year Pt was admitted to hospital in [place] from but ended up with having acute kidney failure, had respiratory failure due to pulmonary edema requiring intubation.  Ultimately spent several days getting dialysis before having renal recovery.  He told ED physician that he is not supposed to get diuretics.  This patient was in fact discharged by myself during his recent hospitalization on 07/09/2020.  He was also seen by nephrologist.  There was no such discussion that he cannot get any diuretics.  His renal function is at baseline.  I will give him a trial of 1 dose of IV Lasix 40 mg.  Reassess tomorrow morning clinically and check renal function.  Chronic upper abdominal pain: The pain is burning and worsens with eating food.  He did have the same pain  during his recent hospitalization and he has upcoming appointment to see gastroenterologist.  This is likely gastritis.  He was discharged on omeprazole for that.  I will resume that.  Type 2 diabetes mellitus: Resume 10 units of Lantus and continue SSI.  Hypertension: Slightly elevated.  He is on several medications for hypertension which I will resume.  BPH: Continue Flomax and finasteride.  Anxiety: Continue benzodiazepines.  Hyperlipidemia: Continue Crestor.  CKD stage IV: At baseline.  DVT prophylaxis: heparin injection 5,000 Units Start: 08/03/20 2200 Code Status: Full code Family Communication: None present at bedside.  Plan of care discussed with patient in length and he verbalized understanding and agreed with it. Disposition Plan: Likely discharge home tomorrow. Consults called: None Admission status: Observation   Status is: Observation  The patient remains OBS appropriate and will d/c before 2 midnights.  Dispo: The patient is from: Home              Anticipated d/c is to: Home              Patient currently is not medically stable to d/c.   Difficult to place patient No       Darliss Cheney MD [facility]  08/03/2020, 5:51 PM  To contact the attending provider between 7A-7P or the covering provider during after hours 7P-7A, please log into the web site [email or web address]

## 2020-08-03 NOTE — ED Notes (Signed)
Attempted report x1. 

## 2020-08-03 NOTE — ED Provider Notes (Signed)
[facility] EMERGENCY DEPARTMENT Provider Note   CSN: [id number] Arrival date & time: 08/03/20  1113     History Chief Complaint  Patient presents with   Leg Swelling    Ryan Jenkins is a 60 y.o. male presenting for evaluation of leg swelling and worsening shortness of breath.  Patient states he has had increasing leg swelling for the past 3 days.  This morning he developed chest pain, nausea, vomiting, shortness of breath.  The nausea, vomiting, pain has mostly resolved, however he continues to feel short of breath.  This is worse with exertion and talking.  He states he has a history of heart failure, but is unable to take Lasix due to his poor kidney function.  He has a follow-up scheduled with cardiology in 2 days, this will be his first appointment.  Patient states he has had difficulty with his medications due to insurance, however this just recently got addressed.  He denies recent fevers, abdominal pain, change in urination or bowel movements.  HPI     Past Medical History:  Diagnosis Date   Abdominal pain 11/25/2018   Acid reflux 11/25/2018   Anxiety    Bilateral swelling of feet and ankles 11/25/2018   and legs   Change in bowel habits 11/25/2018   Constipation 11/25/2018   Depression    Diabetes mellitus without complication ([place])    Diabetic neuropathy ([place])    Diarrhea 11/25/2018   Fatigue 11/25/2018   GERD (gastroesophageal reflux disease)    Hypertension    Loss of appetite 11/25/2018   Muscle spasm 11/25/2018   Neuromuscular disorder ([place])    neuropathy   Retinopathy due to secondary diabetes ([place])    Sleep disturbances 11/25/2018   Vision changes 11/25/2018   Vitamin B12 deficiency     Patient Active Problem List   Diagnosis Date Noted   Acute CHF (congestive heart failure) ([place]) 08/03/2020   CKD (chronic kidney disease) stage 4, GFR 15-29 ml/min ([place]) 07/07/2020   Chest pain 07/07/2020   Acute on chronic diastolic CHF  (congestive heart failure) ([place]) 07/07/2020   Erectile dysfunction associated with type 2 diabetes mellitus ([place]) 08/07/2019   Hives 08/07/2019   Vitreous hemorrhage of left eye ([place]) 07/22/2019   Elevated troponin 07/22/2019   Vision loss of left eye 07/22/2019   History of medication noncompliance 04/02/2019   Gastric ulcer without hemorrhage or perforation 12/25/2018   CKD (chronic kidney disease), stage III ([place]) 12/25/2018   Gastroesophageal reflux disease without esophagitis 09/04/2018   Diabetic retinopathy of both eyes associated with type 2 diabetes mellitus ([place]) 01/03/2018   Intermittent diarrhea 09/27/2017   Gastroparesis 09/27/2017   Macroalbuminuric diabetic nephropathy ([place]) 06/25/2017   History of falling 06/25/2017   Hyperlipidemia 03/21/2017   Vitamin B 12 deficiency 03/21/2017   Uncontrolled type 2 diabetes mellitus with peripheral neuropathy ([place]) 02/05/2017   Essential hypertension 02/05/2017   Depression 02/05/2017   Unintended weight loss 02/05/2017   Gait disturbance 02/05/2017   Pronation deformity of both feet 05/11/2014   Diabetic neuropathy, type II diabetes mellitus ([place]) 05/11/2014   Metatarsal deformity 05/11/2014    Past Surgical History:  Procedure Laterality Date   CATARACT EXTRACTION Bilateral 2017       Family History  Problem Relation Age of Onset   Renal cancer Mother    Hypertension Mother    Pancreatic cancer Mother    Hypertension Sister    Stroke Sister    Leukemia Maternal Uncle  Sickle cell trait Maternal Aunt    Colon cancer Neg Hx    Esophageal cancer Neg Hx    Rectal cancer Neg Hx    Stomach cancer Neg Hx     Social History   Tobacco Use   Smoking status: Never   Smokeless tobacco: Never  Vaping Use   Vaping Use: Never used  Substance Use Topics   Alcohol use: No    Alcohol/week: 0.0 standard drinks   Drug use: No    Home Medications Prior to Admission medications   Medication Sig Start Date End Date  Taking? Authorizing Provider  acetaminophen (TYLENOL) 500 MG tablet Take 1,000 mg by mouth in the morning and at bedtime.    [provider]  amLODipine (NORVASC) 10 MG tablet Take 1 tablet (10 mg total) by mouth daily. 07/20/20 08/19/20  Charlott Rakes, MD  atorvastatin (LIPITOR) 40 MG tablet Take 1 tablet (40 mg total) by mouth daily. 07/20/20   Charlott Rakes, MD  Blood Glucose Monitoring Suppl (TRUE METRIX METER) w/Device KIT Use as directed 07/20/20   Ladell Pier, MD  Blood Pressure Monitor DEVI Use as directed to check home blood pressure 2-3 times a week 07/13/20   Camillia Herter, NP  chlorthalidone (HYGROTON) 25 MG tablet Take 0.5 tablets (12.5 mg total) by mouth every other day. 07/20/20   Charlott Rakes, MD  cloNIDine (CATAPRES - DOSED IN MG/24 HR) 0.1 mg/24hr patch Place 1 patch (0.1 mg total) onto the skin once a week. 07/20/20   Charlott Rakes, MD  cyclobenzaprine (FLEXERIL) 5 MG tablet Take 1 tablet (5 mg total) by mouth 2 (two) times daily as needed for muscle spasms (medication can cause drowsiness). 07/20/20   Ladell Pier, MD  D-Mannose 350 MG CAPS Take 1,050 capsules by mouth in the morning and at bedtime.    [provider]  dapagliflozin propanediol (FARXIGA) 10 MG TABS tablet Take 1 tablet (10 mg total) by mouth daily. 07/20/20 08/19/20  Charlott Rakes, MD  escitalopram (LEXAPRO) 20 MG tablet Take 1 tablet (20 mg total) by mouth daily. 07/20/20   Charlott Rakes, MD  finasteride (PROSCAR) 5 MG tablet Take 1 tablet (5 mg total) by mouth daily. 07/20/20   Charlott Rakes, MD  glucose blood (TRUE METRIX BLOOD GLUCOSE TEST) test strip Use as instructed 07/20/20   Ladell Pier, MD  hydrALAZINE (APRESOLINE) 100 MG tablet Take 1 tablet (100 mg total) by mouth 3 (three) times daily. 07/20/20   Charlott Rakes, MD  hydrALAZINE (APRESOLINE) 25 MG tablet Take 1 tablet (25 mg total) by mouth 3 (three) times daily. 07/20/20   Charlott Rakes, MD  insulin detemir (LEVEMIR) 100  UNIT/ML injection Inject 0.1 mLs (10 Units total) into the skin daily. 07/20/20   Charlott Rakes, MD  Insulin Syringe-Needle U-100 (TRUEPLUS INSULIN SYRINGE) 31G X 5/16" 0.3 ML MISC Use to inject Levemir at bedtime. 07/20/20   Charlott Rakes, MD  loperamide (IMODIUM) 2 MG capsule Take 2 mg by mouth See admin instructions. Give 1 tablet by mouth every 2 hours as needed for each loose stool not to exceed 51m in 24 hours.    [provider]  metoprolol succinate (TOPROL-XL) 100 MG 24 hr tablet Take 1.5 tablets (150 mg total) by mouth at bedtime. Take with or immediately following a meal. 07/20/20   NCharlott Rakes MD  Multiple Vitamins-Minerals (MULTIVITAMIN WITH MINERALS) tablet Take 1 tablet by mouth daily.    [provider]  nortriptyline (PAMELOR) 25 MG  capsule Take 1 capsule (25 mg total) by mouth at bedtime. 07/20/20   Charlott Rakes, MD  ondansetron (ZOFRAN) 4 MG tablet Take 1 tablet (4 mg total) by mouth every 8 (eight) hours as needed for nausea or vomiting. 07/20/20   Charlott Rakes, MD  pantoprazole (PROTONIX) 40 MG tablet Take 1 tablet (40 mg total) by mouth daily. 07/09/20 08/08/20  Darliss Cheney, MD  tadalafil (CIALIS) 5 MG tablet Take 1 tablet (5 mg total) by mouth daily. 07/20/20   Charlott Rakes, MD  tamsulosin (FLOMAX) 0.4 MG CAPS capsule Take 1 capsule (0.4 mg total) by mouth daily after breakfast. 07/20/20   Charlott Rakes, MD  traMADol (ULTRAM) 50 MG tablet Take 50 mg by mouth 2 (two) times daily.    [provider]  traZODone (DESYREL) 50 MG tablet Take 3 tablets (150 mg total) by mouth at bedtime. 07/20/20   Charlott Rakes, MD  TRUEplus Lancets 28G MISC Use as directed 07/20/20   Ladell Pier, MD    Allergies    Claritin [loratadine], Hydrochlorothiazide, Latex, Lyrica [pregabalin], and Metformin and related  Review of Systems   Review of Systems  Respiratory:  Positive for shortness of breath.   Cardiovascular:  Positive for leg swelling.  All other  systems reviewed and are negative.  Physical Exam Updated Vital Signs BP (!) 145/86   Pulse 87   Temp 97.9 F (36.6 C) (Oral)   Resp 15   SpO2 98%   Physical Exam Vitals and nursing note reviewed.  Constitutional:      General: He is not in acute distress.    Appearance: Normal appearance.     Comments: Appears nontoxic  HENT:     Head: Normocephalic and atraumatic.  Eyes:     Conjunctiva/sclera: Conjunctivae normal.     Pupils: Pupils are equal, round, and reactive to light.  Cardiovascular:     Rate and Rhythm: Normal rate and regular rhythm.     Pulses: Normal pulses.  Pulmonary:     Effort: Pulmonary effort is normal. No respiratory distress.     Breath sounds: Normal breath sounds. No wheezing.     Comments: Rales in bilateral lower lobes, left worse than right.  Speaking in short sentences.  Sats stable on room air Abdominal:     General: There is no distension.     Palpations: Abdomen is soft.     Tenderness: There is no abdominal tenderness.  Musculoskeletal:        General: Normal range of motion.     Cervical back: Normal range of motion and neck supple.     Right lower leg: Edema present.     Left lower leg: Edema present.     Comments: 2-3+ pitting edema bilaterally  Skin:    General: Skin is warm and dry.     Capillary Refill: Capillary refill takes less than 2 seconds.  Neurological:     Mental Status: He is alert and oriented to person, place, and time.  Psychiatric:        Mood and Affect: Mood and affect normal.        Speech: Speech normal.        Behavior: Behavior normal.    ED Results / Procedures / Treatments   Labs (all labs ordered are listed, but only abnormal results are displayed) Labs Reviewed  BASIC METABOLIC PANEL - Abnormal; Notable for the following components:      Result Value   Glucose, Bld 357 (*)  BUN 36 (*)    Creatinine, Ser 2.65 (*)    Calcium 8.8 (*)    GFR, Estimated 27 (*)    All other components within normal  limits  CBC - Abnormal; Notable for the following components:   RBC 3.24 (*)    Hemoglobin 9.7 (*)    HCT 30.4 (*)    All other components within normal limits  HEPATIC FUNCTION PANEL - Abnormal; Notable for the following components:   Albumin 3.4 (*)    Alkaline Phosphatase 137 (*)    All other components within normal limits  BRAIN NATRIURETIC PEPTIDE - Abnormal; Notable for the following components:   B Natriuretic Peptide 552.3 (*)    All other components within normal limits  TROPONIN I (HIGH SENSITIVITY) - Abnormal; Notable for the following components:   Troponin I (High Sensitivity) 28 (*)    All other components within normal limits  RESP PANEL BY RT-PCR (FLU A&B, COVID) [id number]  TROPONIN I (HIGH SENSITIVITY)    EKG EKG Interpretation  Date/Time:  Wednesday August 03 2020 11:24:49 EDT Ventricular Rate:  83 PR Interval:  172 QRS Duration: 102 QT Interval:  400 QTC Calculation: 470 R Axis:   -16 Text Interpretation: Normal sinus rhythm Minimal voltage criteria for LVH, may be normal variant ( Cornell product ) Confirmed by Lennice Sites ([id number]) on 08/03/2020 5:01:44 PM  Radiology DG Chest 2 View  Result Date: 08/03/2020 CLINICAL DATA:  Shortness of breath EXAM: CHEST - 2 VIEW COMPARISON:  07/06/2020 FINDINGS: Symmetric interstitial prominence with a few suspected Kerley lines and fissure thickening. No pleural effusion or consolidation. No pneumothorax. Normal heart size and mediastinal contours. IMPRESSION: Early interstitial edema. Electronically Signed   By: Monte Fantasia M.D.   On: 08/03/2020 11:58    Procedures Procedures   Medications Ordered in ED Medications  ondansetron (ZOFRAN-ODT) disintegrating tablet 4 mg (has no administration in time range)  furosemide (LASIX) injection 40 mg (has no administration in time range)    ED Course  I have reviewed the triage vital signs and the nursing notes.  Pertinent labs & imaging results that were available during  my care of the patient were reviewed by me and considered in my medical decision making (see chart for details).    MDM Rules/Calculators/A&P                           Patient presenting for evaluation of worsening leg swelling and shortness of breath.  On exam, patient is not in acute distress.  He is speaking in short sentences, has rales in bilateral lower lobes.  He does have evidence of worsening heart failure with pitting edema bilaterally.  Patient with a complex medical history including CKD stage IV and heart failure.  He recently moved to the area, is working to establish with specialty clinics, but has not yet been able to follow-up in the heart failure clinic.  Labs obtained from triage interpreted by me.  Shows slight worsening kidney function and slightly elevated BNP when compared to discharge less than a month ago.  Chest x-ray viewed and independently interpreted by me, shows some interstitial edema.  EKG is nonischemic.  Troponin is minimally elevated, but not as high as previous.  This is likely due to demand/heart failure.  Due to patient's kidney function, I feel he would benefit from observation while getting gentle diuresis to ensure kidney function does not worsen.  Discussed with Dr. Doristine Bosworth from  [facility] hospitalist service, patient to be admitted.  Final Clinical Impression(s) / ED Diagnoses Final diagnoses:  Acute on chronic heart failure, unspecified heart failure type [facility])    Rx / DC Orders ED Discharge Orders     None        Franchot Heidelberg, PA-C 08/03/20 1748    Lennice Sites, DO 08/03/20 1850

## 2020-08-03 NOTE — ED Triage Notes (Signed)
Patient complains of "rattling sound" in his chest that started on Monday, also complains of bilateral lower extremity edema. History of congestive heart failure. Patient alert, oriented, and in no apparent distress at this time.

## 2020-08-04 ENCOUNTER — Other Ambulatory Visit: Payer: Self-pay

## 2020-08-04 DIAGNOSIS — I5031 Acute diastolic (congestive) heart failure: Secondary | ICD-10-CM | POA: Diagnosis not present

## 2020-08-04 LAB — BASIC METABOLIC PANEL
Anion gap: 9 (ref 5–15)
BUN: 36 mg/dL — ABNORMAL HIGH (ref 6–20)
CO2: 22 mmol/L (ref 22–32)
Calcium: 8.6 mg/dL — ABNORMAL LOW (ref 8.9–10.3)
Chloride: 106 mmol/L (ref 98–111)
Creatinine, Ser: 2.69 mg/dL — ABNORMAL HIGH (ref 0.61–1.24)
GFR, Estimated: 26 mL/min — ABNORMAL LOW (ref >60)
Glucose, Bld: 199 mg/dL — ABNORMAL HIGH (ref 70–99)
Potassium: 3.5 mmol/L (ref 3.5–5.1)
Sodium: 137 mmol/L (ref 135–145)

## 2020-08-04 LAB — GLUCOSE, CAPILLARY: Glucose-Capillary: 155 mg/dL — ABNORMAL HIGH (ref 70–99)

## 2020-08-04 MED ORDER — FUROSEMIDE 40 MG PO TABS
40.0000 mg | ORAL_TABLET | Freq: Every day | ORAL | 0 refills | Status: DC | PRN
  Filled 2020-08-04: qty 30, 30d supply, fill #0

## 2020-08-04 NOTE — Evaluation (Signed)
Physical Therapy Evaluation Patient Details Name: Ryan Jenkins MRN: [id number] DOB: Jan 06, 1961 Today's Date: 08/04/2020   History of Present Illness  60 y.o. male presenting on 7/20 for 3 days duration of dry cough, SOB, and bil LE edema. Chest x-ray showed interstitial edema. PMH: DM2 with neuropathy, HTN, CKD 4, suspected non-adherence to medication / diet regimen.  Clinical Impression  Pt performs functional mobility and gait without requiring physical assist to perform. Pt does have decreased sensation to light tough on bil medial and lateral malleoli. Pt ambulates for household distances and reports his balance to be poor. Pt accepts dynamic gait challenges, but with decreased gait speed to perform and no overt LOB noted. Pt negotiates stairs with use of bil railings and step-to pattern, reporting fatigue. Pt with deficits in sensation, balance, strength, and activity tolerance and will benefit from acute PT services to improve safety and quality of mobility. SPT recommends outpatient PT to challenge pt's dynamic balance and improve safety.     Follow Up Recommendations Outpatient PT    Equipment Recommendations  None recommended by PT    Recommendations for Other Services       Precautions / Restrictions Precautions Precautions: Fall Restrictions Weight Bearing Restrictions: No      Mobility  Bed Mobility Overal bed mobility: Modified Independent             General bed mobility comments: HOB elevated    Transfers Overall transfer level: Needs assistance Equipment used: Rolling walker (2 wheeled) Transfers: Sit to/from Stand Sit to Stand: Supervision         General transfer comment: supervision for safety and slow to rise.  Ambulation/Gait Ambulation/Gait assistance: Supervision Gait Distance (Feet): 300 Feet Assistive device: Rolling walker (2 wheeled) Gait Pattern/deviations: Step-through pattern;Decreased stride length Gait velocity:  decreased Gait velocity interpretation: 1.31 - 2.62 ft/sec, indicative of limited community ambulator General Gait Details: pt with slow step-through gait. Pt accepts dynamic gait challenges without overt LOB noted. Pt reports balance is not great.  Stairs Stairs: Yes Stairs assistance: Min guard Stair Management: Two rails;Step to pattern;Forwards Number of Stairs:  (flight) General stair comments: Pt ascends and descends stairs with increased time and bil use of railings with step to pattern. Pt reports fatigue after stair negotiation.  Wheelchair Mobility    Modified Rankin (Stroke Patients Only)       Balance Overall balance assessment: Needs assistance Sitting-balance support: Feet supported Sitting balance-Leahy Scale: Good Sitting balance - Comments: Pt changes socks without reliance of UE support in sitting.   Standing balance support: During functional activity Standing balance-Leahy Scale: Poor Standing balance comment: pt reliant on bil UE support                             Pertinent Vitals/Pain Pain Assessment: No/denies pain    Home Living Family/patient expects to be discharged to:: Private residence Living Arrangements: Other relatives (brother) Available Help at Discharge: Family;Available 24 hours/day Type of Home: Apartment Home Access: Stairs to enter Entrance Stairs-Rails: Can reach both Entrance Stairs-Number of Steps: 19 Home Layout: One level Home Equipment: Walker - 2 wheels;Bedside commode;Tub bench      Prior Function Level of Independence: Independent with assistive device(s)         Comments: pt uses rollator in home and RW in community for mobility     Hand Dominance        Extremity/Trunk Assessment   Upper Extremity Assessment  Upper Extremity Assessment: Overall WFL for tasks assessed    Lower Extremity Assessment Lower Extremity Assessment: Generalized weakness (pt with hx of neuropathy. decreased sensation  to light touch at L4 and S1 dermatomes.)    Cervical / Trunk Assessment Cervical / Trunk Assessment: Normal  Communication   Communication: No difficulties  Cognition Arousal/Alertness: Awake/alert Behavior During Therapy: WFL for tasks assessed/performed Overall Cognitive Status: Within Functional Limits for tasks assessed                                        General Comments General comments (skin integrity, edema, etc.): Pt RR initally fluctuating low 20s to 32, all other VSS on RA.    Exercises     Assessment/Plan    PT Assessment Patient needs continued PT services  PT Problem List Decreased strength;Decreased activity tolerance;Decreased balance;Decreased mobility;Impaired sensation;Decreased knowledge of precautions       PT Treatment Interventions DME instruction;Gait training;Stair training;Functional mobility training;Therapeutic activities;Therapeutic exercise;Balance training;Patient/family education    PT Goals (Current goals can be found in the Care Plan section)  Acute Rehab PT Goals Patient Stated Goal: improve balance PT Goal Formulation: With patient Time For Goal Achievement: 08/18/20 Potential to Achieve Goals: Good Additional Goals Additional Goal #1: Pt will score > 19/24 on DGI for improved safety and balance with dynamic gait activities and to maximize independence.    Frequency Min 3X/week   Barriers to discharge        Co-evaluation               AM-PAC PT "6 Clicks" Mobility  Outcome Measure Help needed turning from your back to your side while in a flat bed without using bedrails?: None Help needed moving from lying on your back to sitting on the side of a flat bed without using bedrails?: None Help needed moving to and from a bed to a chair (including a wheelchair)?: A Little Help needed standing up from a chair using your arms (e.g., wheelchair or bedside chair)?: None Help needed to walk in hospital room?: A  Little Help needed climbing 3-5 steps with a railing? : A Little 6 Click Score: 21    End of Session Equipment Utilized During Treatment: Gait belt Activity Tolerance: Patient tolerated treatment well Patient left: in chair;with call bell/phone within reach Nurse Communication: Mobility status PT Visit Diagnosis: Unsteadiness on feet (R26.81);Muscle weakness (generalized) (M62.81);Difficulty in walking, not elsewhere classified (R26.2)    Time: 6237-6283 PT Time Calculation (min) (ACUTE ONLY): 34 min   Charges:   PT Evaluation $PT Eval Low Complexity: 1 Low PT Treatments $Therapeutic Activity: 8-22 mins        Acute Rehab  Pager: [phone]   Garwin Brothers, SPT  08/04/2020, 9:27 AM

## 2020-08-04 NOTE — Discharge Summary (Signed)
Discharge Summary  Ryan Jenkins XTA:[id number] DOB: [birth year]  PCP: Pcp, No  Admit date: 08/03/2020 Discharge date: 08/04/2020  Time spent:  7mns  Recommendations for Outpatient Follow-up:  F/u with PCP within a week  for hospital discharge follow up, repeat cbc/bmp at follow up F/u with cardiology/nephrology/ophthalmology as already scheduled Outpatient referral to diabetes/diet education made Outpatient referral to PT made    Discharge Diagnoses:  Active Hospital Problems   Diagnosis Date Noted   Acute CHF (congestive heart failure) ([facility] 08/03/2020    Resolved Hospital Problems  No resolved problems to display.    Discharge Condition: stable  Diet recommendation: heart healthy/carb modified  Filed Weights   08/03/20 1809 08/03/20 1852 08/04/20 0447  Weight: 89.8 kg 91.1 kg 89.9 kg    History of present illness: (Per admitting MD Dr. PDoristine Bosworth HPI: Ryan BUNTROCKis a 60y.o. male with medical history significant of DM2 with neuropathy, HTN, CKD 4, suspected non-adherence to medication / diet regimen presented to ED with complaint of 3 days duration of dry cough, shortness of breath and bilateral lower extremity swelling.  Shortness of breath is mostly exertional.  He cannot verify if he has any nocturnal dyspnea.  No other complaints such as fever, chills, sweating, any problem with urination or bowel bowel movements.  [facility]  He is in the process of establishing care with different physicians.  He has upcoming appointment with his GI doctor in August due to chronic epigastric pain, likely gastritis and has appointment to see cardiologist in 2 days.  No recent travel or sick contact.  He is fully vaccinated for COVID-19 according to him.   ED Course: Upon arrival to ED, he was hemodynamically stable.  In fact saturating 100% on room air.  Chest x-ray showed interstitial edema.  Renal function at his  baseline.  BNP slightly elevated than his baseline.  Patient did not receive any diuretics.  Hospitalist were called to admit the patient for acute on chronic congestive heart failure.  COVID test is pending.  Hospital Course:  Active Problems:   Acute CHF (congestive heart failure) (HCC)  Acute on chronic diastolic CHF exacerbation -Presented with short of breath, lower extremity edema, chest x-ray on presentation with interstitial edema -Report he was discharged home without Lasix recently -Received IV Lasix with good response, edema resolved, short of breath resolved -He is feeling better -Noticed that he is on chlorthalidone every other day at home, appeared recently started by PCP in June -He is given Lasix as needed if swelling or weight gain -He is advised to check daily weight monitor intake and output, close follow-up with PCP and cardiology   HTN; continue home meds , listed  below, he is advised to check blood pressure at home, bring blood pressure record to PCP and cardiology for further blood pressure meds adjustment  Insulin-dependent type 2 diabetes Continue Levemir and Farxiga  CKD stage IV, creatinine at baseline, [place] [facility]  Procedures: None  Consultations: None  Discharge Exam: BP 124/72 (BP Location: Left Arm)   Pulse 66   Temp 98.4 F (36.9 C) (Oral)   Resp 16   Ht _0  (1.88 m)   Wt 89.9 kg   SpO2 95%   BMI 25.46 kg/m   General: NAD, pleasant Cardiovascular: RRR, no edema Respiratory: Clear to auscultation bilaterally ,normal respiratory effort  Discharge Instructions You were cared for by a hospitalist  during your hospital stay. If you have any questions about your discharge medications or the care you received while you were in the hospital after you are discharged, you can call the unit and asked to speak with the hospitalist on call if the hospitalist that took care of you is not available. Once you are discharged,  your primary care physician will handle any further medical issues. Please note that NO REFILLS for any discharge medications will be authorized once you are discharged, as it is imperative that you return to your primary care physician (or establish a relationship with a primary care physician if you do not have one) for your aftercare needs so that they can reassess your need for medications and monitor your lab values.  Discharge Instructions     Ambulatory referral to Nutrition and Diabetic Education   Complete by: As directed    Ambulatory referral to Physical Therapy   Complete by: As directed    Diet - low sodium heart healthy   Complete by: As directed    Carb modified diet   Increase activity slowly   Complete by: As directed       Allergies as of 08/04/2020       Reactions   Claritin [loratadine] Swelling   Joint swelling   Hydrochlorothiazide Other (See Comments)   Dizziness   Latex Hives   Lyrica [pregabalin] Other (See Comments)   depression   Metformin And Related Other (See Comments)   GI        Medication List     TAKE these medications    acetaminophen 500 MG tablet Commonly known as: TYLENOL Take 1,000 mg by mouth in the morning and at bedtime.   amLODipine 10 MG tablet Commonly known as: NORVASC Take 1 tablet (10 mg total) by mouth daily.   atorvastatin 40 MG tablet Commonly known as: LIPITOR Take 1 tablet (40 mg total) by mouth daily.   Blood Pressure Monitor Devi Use as directed to check home blood pressure 2-3 times a week   chlorthalidone 25 MG tablet Commonly known as: HYGROTON Take 0.5 tablets (12.5 mg total) by mouth every other day.   cloNIDine 0.1 mg/24hr patch Commonly known as: CATAPRES - Dosed in mg/24 hr Place 1 patch (0.1 mg total) onto the skin once a week.   cyclobenzaprine 5 MG tablet Commonly known as: FLEXERIL Take 1 tablet (5 mg total) by mouth 2 (two) times daily as needed for muscle spasms (medication can cause  drowsiness).   D-Mannose 350 MG Caps Take 1,050 capsules by mouth in the morning and at bedtime.   escitalopram 20 MG tablet Commonly known as: LEXAPRO Take 1 tablet (20 mg total) by mouth daily.   Farxiga 10 MG Tabs tablet Generic drug: dapagliflozin propanediol Take 1 tablet (10 mg total) by mouth daily.   finasteride 5 MG tablet Commonly known as: PROSCAR Take 1 tablet (5 mg total) by mouth daily.   furosemide 40 MG tablet Commonly known as: Lasix Take 1 tablet (40 mg total) by mouth daily as needed for fluid or edema.   hydrALAZINE 100 MG tablet Commonly known as: APRESOLINE Take 1 tablet (100 mg total) by mouth 3 (three) times daily.   hydrALAZINE 25 MG tablet Commonly known as: APRESOLINE Take 1 tablet (25 mg total) by mouth 3 (three) times daily.   Levemir 100 UNIT/ML injection Generic drug: insulin detemir Inject 0.1 mLs (10 Units total) into the skin daily.   loperamide 2 MG capsule Commonly  known as: IMODIUM Take 2 mg by mouth See admin instructions. Give 1 tablet by mouth every 2 hours as needed for each loose stool not to exceed 24m in 24 hours.   metoprolol succinate 100 MG 24 hr tablet Commonly known as: TOPROL-XL Take 1.5 tablets (150 mg total) by mouth at bedtime. Take with or immediately following a meal.   multivitamin with minerals tablet Take 1 tablet by mouth daily.   nortriptyline 25 MG capsule Commonly known as: PAMELOR Take 1 capsule (25 mg total) by mouth at bedtime.   ondansetron 4 MG tablet Commonly known as: ZOFRAN Take 1 tablet (4 mg total) by mouth every 8 (eight) hours as needed for nausea or vomiting.   pantoprazole 40 MG tablet Commonly known as: PROTONIX Take 1 tablet (40 mg total) by mouth daily.   tadalafil 5 MG tablet Commonly known as: CIALIS Take 1 tablet (5 mg total) by mouth daily.   tamsulosin 0.4 MG Caps capsule Commonly known as: FLOMAX Take 1 capsule (0.4 mg total) by mouth daily after breakfast.   traMADol  50 MG tablet Commonly known as: ULTRAM Take 50 mg by mouth 2 (two) times daily.   traZODone 50 MG tablet Commonly known as: DESYREL Take 3 tablets (150 mg total) by mouth at bedtime.   True Metrix Blood Glucose Test test strip Generic drug: glucose blood Use as instructed   True Metrix Meter w/Device Kit Use as directed   TRUEplus Insulin Syringe 31G X 5/16" 0.3 ML Misc Generic drug: Insulin Syringe-Needle U-100 Use to inject Levemir at bedtime.   TRUEplus Lancets 28G Misc Use as directed       Allergies  Allergen Reactions   Claritin [Loratadine] Swelling    Joint swelling    Hydrochlorothiazide Other (See Comments)    Dizziness   Latex Hives   Lyrica [Pregabalin] Other (See Comments)    depression   Metformin And Related Other (See Comments)    GI    Follow-up Information     f/u with your pcp in three weeks Follow up.          f/u with nephrology as already scheduled Follow up.          f/u with cardiology Follow up.          [facility]   Contact information: [phone]                 The results of significant diagnostics from this hospitalization (including imaging, microbiology, ancillary and laboratory) are listed below for reference.    Significant Diagnostic Studies: CT Abdomen Pelvis Wo Contrast  Result Date: 07/06/2020 CLINICAL DATA:  Chest and abdominal pain EXAM: CT ABDOMEN AND PELVIS WITHOUT CONTRAST TECHNIQUE: Multidetector CT imaging of the abdomen and pelvis was performed following the standard protocol without IV contrast. COMPARISON:  None. FINDINGS: Lower chest: No acute abnormality. Hepatobiliary: No focal liver abnormality is seen. No gallstones, gallbladder wall thickening, or biliary dilatation. Pancreas: Unremarkable. No pancreatic ductal dilatation or surrounding inflammatory changes. Spleen: Normal in size without focal abnormality. Adrenals/Urinary Tract: Right  adrenal gland is within normal limits. 3 cm hypodense lesion in the left adrenal gland is noted likely representing an adenoma. Kidneys are well visualize without evidence of renal calculi or obstructive changes. Mild perinephric stranding is noted of uncertain significance. Ureters are within normal limits. Bladder is partially distended. Stomach/Bowel: The appendix is well visualized and within normal limits. No obstructive  or inflammatory changes of the colon are seen. Small bowel and stomach are unremarkable. Vascular/Lymphatic: No significant vascular findings are present. No enlarged abdominal or pelvic lymph nodes. Reproductive: Prostate is unremarkable. Calcification of the seminal vesicles and vas deferens is noted. Other: No abdominal wall hernia or abnormality. No abdominopelvic ascites. Musculoskeletal: Degenerative changes of lumbar spine are noted. IMPRESSION: No acute abnormality is noted within the abdomen. 3 cm hypodense lesion in the left adrenal gland likely representing an adenoma. No other focal abnormality is noted. Electronically Signed   By: Inez Catalina M.D.   On: 07/06/2020 23:28   DG Chest 2 View  Result Date: 08/03/2020 CLINICAL DATA:  Shortness of breath EXAM: CHEST - 2 VIEW COMPARISON:  07/06/2020 FINDINGS: Symmetric interstitial prominence with a few suspected Kerley lines and fissure thickening. No pleural effusion or consolidation. No pneumothorax. Normal heart size and mediastinal contours. IMPRESSION: Early interstitial edema. Electronically Signed   By: Monte Fantasia M.D.   On: 08/03/2020 11:58   DG Chest 2 View  Result Date: 07/06/2020 CLINICAL DATA:  Chest pain EXAM: CHEST - 2 VIEW COMPARISON:  07/22/2019 FINDINGS: The heart size and mediastinal contours are within normal limits. Both lungs are clear. The visualized skeletal structures are unremarkable. IMPRESSION: No active cardiopulmonary disease. Electronically Signed   By: Fidela Salisbury MD   On: 07/06/2020 20:06    DG THORACOLUMABAR SPINE  Result Date: 07/07/2020 CLINICAL DATA:  Patient fell 3 days ago EXAM: THORACOLUMBAR SPINE 2V COMPARISON:  CT abdomen pelvis 07/06/2020 FINDINGS: There is no evidence of fracture of the lower thoracic and lumbar. Preserved disc heights with peripherally calcified posterior bulging discs from L3-S1. Tubular calcifications in the pelvis. IMPRESSION: No evidence of fracture in the visualized lower thoracic and lumbar spine. Electronically Signed   By: Maurine Simmering   On: 07/07/2020 11:26   CT KNEE LEFT WO CONTRAST  Result Date: 07/07/2020 CLINICAL DATA:  Limping, acute, knee pain (Ped 0-5y) EXAM: CT OF THE left KNEE WITHOUT CONTRAST TECHNIQUE: Multidetector CT imaging of the left knee was performed according to the standard protocol. Multiplanar CT image reconstructions were also generated. COMPARISON:  None. FINDINGS: Bones/Joint/Cartilage There is no evidence of fracture. There is tricompartment osteophyte formation. There is subchondral sclerosis and cystic change. There is a broad area of articular surface irregularity with likely osteochondral defects along the weight-bearing medial femoral condyle (sagittal image 31). Similar finding along the lateral patellar facet (axial images 53-57). There is a deep popliteal groove (coronal image 54). There is chondrocalcinosis of the medial and lateral menisci and along the ACL. There is a moderate-sized joint effusion. There is a moderate size Baker cyst. Ligaments Suboptimally assessed by CT. Muscles and Tendons No significant muscle atrophy. Soft tissues Vascular calcifications. IMPRESSION: Tricompartment arthritis of the right knee, with articular surface irregularity along the weight-bearing medial femoral condyle and lateral patellar facet. Moderate-sized joint effusion and Baker cyst. Chondrocalcinosis of the medial and lateral menisci and deep popliteal groove. Findings can be seen with degenerative joint disease and/or CPPD  arthropathy. Electronically Signed   By: Maurine Simmering   On: 07/07/2020 11:35   CT KNEE RIGHT WO CONTRAST  Result Date: 07/07/2020 CLINICAL DATA:  Limping, acute, knee pain (Ped 0-5y) EXAM: CT OF THE right KNEE WITHOUT CONTRAST TECHNIQUE: Multidetector CT imaging of the right knee was performed according to the standard protocol. Multiplanar CT image reconstructions were also generated. COMPARISON:  None. FINDINGS: Bones/Joint/Cartilage There is no evidence of acute fracture. Minimal tricompartment osteophyte  formation. There are multiple 4-5 mm osteochondral defect or erosive change along the lateral patellar facet, medial trochlea, and anterior weight-bearing medial femoral condyle (axial images 51-54, 71, and coronal image 42 respectively). Additional possible erosive change along the medial upper margin of the joint along the peripheral medial femoral condyle, with well corticated margins (coronal image 61). There is chondrocalcinosis involving both menisci. There is mild medial joint space narrowing. There is a moderate size joint effusion. Ligaments Suboptimally assessed by CT. Muscles and Tendons No muscle atrophy. Soft tissues Vascular calcifications. IMPRESSION: Tricompartment arthritis of the right knee with erosive change versus multiple osteochondral defects as described above. Moderate-sized joint effusion. Findings can be seen with degenerative joint disease and/or CPPD arthropathy. Rheumatoid arthritis is also in the differential. Electronically Signed   By: Maurine Simmering   On: 07/07/2020 11:22   DG FLUORO GUIDED NEEDLE PLC ASPIRATION/INJECTION LOC  Result Date: 07/09/2020 CLINICAL DATA:  Bilateral knee pain with joint effusions. Request for bilateral knee aspirations for diagnostic and therapeutic purposes. EXAM: BILATERAL KNEE ASPIRATION UNDER FLUOROSCOPY COMPARISON:  Bilateral knee CT 07/07/2020 FLUOROSCOPY TIME:  Fluoroscopy Time:  6 seconds Radiation Exposure Index (if provided by the  fluoroscopic device): 0.30 mGy Number of Acquired Spot Images: 0 PROCEDURE: Overlying skin prepped with Betadine, draped in the usual sterile fashion, and infiltrated locally with 1% lidocaine. 18 gauge 1.5 inch needles were advanced into the right and left knee joints via a medial patellofemoral approach. Clear joint fluid was aspirated bilaterally (blood tinged on the right). 70 mL of fluid were collected on the right and 60 mL on the left and were sent for the requested laboratory studies. The needles were removed and bandages were placed. There was no immediate complication. IMPRESSION: Technically successful bilateral knee joint aspirations. Electronically Signed   By: Logan Bores M.D.   On: 07/09/2020 13:21   DG FLUORO GUIDED NEEDLE PLC ASPIRATION/INJECTION LOC  Result Date: 07/09/2020 CLINICAL DATA:  Bilateral knee pain with joint effusions. Request for bilateral knee aspirations for diagnostic and therapeutic purposes. EXAM: BILATERAL KNEE ASPIRATION UNDER FLUOROSCOPY COMPARISON:  Bilateral knee CT 07/07/2020 FLUOROSCOPY TIME:  Fluoroscopy Time:  6 seconds Radiation Exposure Index (if provided by the fluoroscopic device): 0.30 mGy Number of Acquired Spot Images: 0 PROCEDURE: Overlying skin prepped with Betadine, draped in the usual sterile fashion, and infiltrated locally with 1% lidocaine. 18 gauge 1.5 inch needles were advanced into the right and left knee joints via a medial patellofemoral approach. Clear joint fluid was aspirated bilaterally (blood tinged on the right). 70 mL of fluid were collected on the right and 60 mL on the left and were sent for the requested laboratory studies. The needles were removed and bandages were placed. There was no immediate complication. IMPRESSION: Technically successful bilateral knee joint aspirations. Electronically Signed   By: Logan Bores M.D.   On: 07/09/2020 13:21   ECHOCARDIOGRAM COMPLETE  Result Date: 07/07/2020    ECHOCARDIOGRAM REPORT   Patient  Name:   LAKER THOMPSON Date of Exam: 07/07/2020 Medical Rec #:  [id number]         Height:       74.0 in Accession #:    [id number]        Weight:       205.2 lb Date of Birth:  1960/05/25         BSA:          2.198 m Patient Age:    3 years  BP:           172/88 mmHg Patient Gender: M                 HR:           90 bpm. Exam Location:  Inpatient Procedure: 2D Echo, Cardiac Doppler and Color Doppler Indications:    CHF, chest pain  History:        Patient has prior history of Echocardiogram examinations, most                 recent 07/23/2019. Signs/Symptoms:CKD and Chest Pain; Risk                 Factors:Diabetes and Hypertension.  Sonographer:    Dustin Flock Referring Phys: [place]  1. Left ventricular ejection fraction, by estimation, is 60 to 65%. The left ventricle has normal function. The left ventricle has no regional wall motion abnormalities. There is severe left ventricular hypertrophy. Left ventricular diastolic parameters  are consistent with Grade I diastolic dysfunction (impaired relaxation). Elevated left atrial pressure.  2. Right ventricular systolic function is normal. The right ventricular size is normal.  3. Left atrial size was mildly dilated.  4. The mitral valve is normal in structure. Trivial mitral valve regurgitation. No evidence of mitral stenosis.  5. The aortic valve is tricuspid. Aortic valve regurgitation is not visualized. Mild aortic valve sclerosis is present, with no evidence of aortic valve stenosis.  6. The inferior vena cava is normal in size with greater than 50% respiratory variability, suggesting right atrial pressure of 3 mmHg. FINDINGS  Left Ventricle: Left ventricular ejection fraction, by estimation, is 60 to 65%. The left ventricle has normal function. The left ventricle has no regional wall motion abnormalities. The left ventricular internal cavity size was normal in size. There is  severe left ventricular hypertrophy. Left  ventricular diastolic parameters are consistent with Grade I diastolic dysfunction (impaired relaxation). Elevated left atrial pressure. Right Ventricle: The right ventricular size is normal. Right ventricular systolic function is normal. Left Atrium: Left atrial size was mildly dilated. Right Atrium: Right atrial size was normal in size. Pericardium: Trivial pericardial effusion is present. Mitral Valve: The mitral valve is normal in structure. Trivial mitral valve regurgitation. No evidence of mitral valve stenosis. Tricuspid Valve: The tricuspid valve is normal in structure. Tricuspid valve regurgitation is not demonstrated. No evidence of tricuspid stenosis. Aortic Valve: The aortic valve is tricuspid. Aortic valve regurgitation is not visualized. Mild aortic valve sclerosis is present, with no evidence of aortic valve stenosis. Pulmonic Valve: The pulmonic valve was normal in structure. Pulmonic valve regurgitation is not visualized. No evidence of pulmonic stenosis. Aorta: The aortic root is normal in size and structure. Venous: The inferior vena cava is normal in size with greater than 50% respiratory variability, suggesting right atrial pressure of 3 mmHg. IAS/Shunts: No atrial level shunt detected by color flow Doppler.  LEFT VENTRICLE PLAX 2D LVIDd:         4.00 cm  Diastology LVIDs:         2.90 cm  LV e' medial:    6.09 cm/s LV PW:         1.50 cm  LV E/e' medial:  16.4 LV IVS:        1.50 cm  LV e' lateral:   6.31 cm/s LVOT diam:     2.20 cm  LV E/e' lateral: 15.8 LV SV:  73 LV SV Index:   33 LVOT Area:     3.80 cm  RIGHT VENTRICLE RV Basal diam:  3.80 cm RV S prime:     11.20 cm/s TAPSE (M-mode): 1.8 cm LEFT ATRIUM             Index       RIGHT ATRIUM           Index LA diam:        4.00 cm 1.82 cm/m  RA Area:     13.70 cm LA Vol (A2C):   77.3 ml 35.18 ml/m RA Volume:   31.80 ml  14.47 ml/m LA Vol (A4C):   73.0 ml 33.22 ml/m LA Biplane Vol: 76.4 ml 34.77 ml/m  AORTIC VALVE LVOT Vmax:    104.00 cm/s LVOT Vmean:  66.400 cm/s LVOT VTI:    0.193 m  AORTA Ao Root diam: 3.30 cm MITRAL VALVE MV Area (PHT): 11.67 cm    SHUNTS MV Decel Time: 65 msec      Systemic VTI:  0.19 m MV E velocity: 100.00 cm/s  Systemic Diam: 2.20 cm MV A velocity: 107.00 cm/s MV E/A ratio:  0.93 Kirk Ruths MD Electronically signed by Kirk Ruths MD Signature Date/Time: 07/07/2020/11:37:43 AM    Final     Microbiology: Recent Results (from the past 240 hour(s))  Resp Panel by RT-PCR (Flu A&B, Covid) Nasopharyngeal Swab     Status: None   Collection Time: 08/03/20  6:13 PM   Specimen: Nasopharyngeal Swab; Nasopharyngeal(NP) swabs in vial transport medium  Result Value Ref Range Status   SARS Coronavirus 2 by RT PCR NEGATIVE NEGATIVE Final    Comment: (NOTE) SARS-CoV-2 target nucleic acids are NOT DETECTED.  The SARS-CoV-2 RNA is generally detectable in upper respiratory specimens during the acute phase of infection. The lowest concentration of SARS-CoV-2 viral copies this assay can detect is 138 copies/mL. A negative result does not preclude SARS-Cov-2 infection and should not be used as the sole basis for treatment or other patient management decisions. A negative result may occur with  improper specimen collection/handling, submission of specimen other than nasopharyngeal swab, presence of viral mutation(s) within the areas targeted by this assay, and inadequate number of viral copies(<138 copies/mL). A negative result must be combined with clinical observations, patient history, and epidemiological information. The expected result is Negative.  Fact Sheet for Patients:  EntrepreneurPulse.com.au  Fact Sheet for Healthcare Providers:  IncredibleEmployment.be  This test is no t yet approved or cleared by the [place] FDA and  has been authorized for detection and/or diagnosis of SARS-CoV-2 by FDA under an Emergency Use Authorization (EUA). This EUA will  remain  in effect (meaning this test can be used) for the duration of the COVID-19 declaration under Section 564(b)(1) of the Act, 21 U.S.C.section 360bbb-3(b)(1), unless the authorization is terminated  or revoked sooner.       Influenza A by PCR NEGATIVE NEGATIVE Final   Influenza B by PCR NEGATIVE NEGATIVE Final    Comment: (NOTE) The Xpert Xpress SARS-CoV-2/FLU/RSV plus assay is intended as an aid in the diagnosis of influenza from Nasopharyngeal swab specimens and should not be used as a sole basis for treatment. Nasal washings and aspirates are unacceptable for Xpert Xpress SARS-CoV-2/FLU/RSV testing.  Fact Sheet for Patients: EntrepreneurPulse.com.au  Fact Sheet for Healthcare Providers: IncredibleEmployment.be  This test is not yet approved or cleared by the [place] FDA and has been authorized for detection and/or diagnosis of SARS-CoV-2 by FDA  under an Emergency Use Authorization (EUA). This EUA will remain in effect (meaning this test can be used) for the duration of the COVID-19 declaration under Section 564(b)(1) of the Act, 21 U.S.C. section 360bbb-3(b)(1), unless the authorization is terminated or revoked.  Performed at [facility], [place] [address] [place], [place] [address]      Labs: Basic Metabolic Panel: Recent Labs  Lab 08/03/20 1155 08/03/20 1755 08/04/20 0242  NA 137  --  137  K 3.9  --  3.5  CL 104  --  106  CO2 22  --  22  GLUCOSE 357*  --  199*  BUN 36*  --  36*  CREATININE 2.65*  --  2.69*  CALCIUM 8.8*  --  8.6*  MG  --  2.0  --    Liver Function Tests: Recent Labs  Lab 08/03/20 1155  AST 19  ALT 20  ALKPHOS 137*  BILITOT 0.9  PROT 7.2  ALBUMIN 3.4*   No results for input(s): LIPASE, AMYLASE in the last 168 hours. No results for input(s): AMMONIA in the last 168 hours. CBC: Recent Labs  Lab 08/03/20 1155  WBC 6.2  HGB 9.7*  HCT 30.4*  MCV 93.8  PLT 325   Cardiac  Enzymes: No results for input(s): CKTOTAL, CKMB, CKMBINDEX, TROPONINI in the last 168 hours. BNP: BNP (last 3 results) Recent Labs    07/06/20 1901 07/08/20 0938 08/03/20 1155  BNP 570.7* 284.2* 552.3*    ProBNP (last 3 results) No results for input(s): PROBNP in the last 8760 hours.  CBG: Recent Labs  Lab 08/03/20 1833 08/03/20 2133 08/04/20 0731  GLUCAP 257* 321* 155*       Signed:  Florencia Reasons MD, PhD, FACP  [facility] 08/04/2020, 10:03 AM

## 2020-08-04 NOTE — Care Management (Signed)
1121 08-04-20 Case Manager spoke with patient regarding outpatient Physical Therapy. Patient is closer to the [employer] and states he has transportation. Ambulatory referral sent to the [facility] and the office will call the patient. Patient states his brother will provide transportation home via private vehicle. No further needs from Case Manager at this time.

## 2020-08-04 NOTE — Plan of Care (Signed)

## 2020-08-04 NOTE — Progress Notes (Signed)
Pt is alert and oriented. Discharge instructions/ AVS given to pt. 

## 2020-08-05 ENCOUNTER — Ambulatory Visit: Payer: Self-pay | Admitting: Cardiovascular Disease

## 2020-08-08 ENCOUNTER — Other Ambulatory Visit: Payer: Self-pay

## 2020-08-15 NOTE — Progress Notes (Signed)
Patient did not show for appointment.   

## 2020-08-16 ENCOUNTER — Encounter: Payer: Self-pay | Admitting: Family

## 2020-08-16 DIAGNOSIS — Z09 Encounter for follow-up examination after completed treatment for conditions other than malignant neoplasm: Secondary | ICD-10-CM

## 2020-08-16 DIAGNOSIS — E785 Hyperlipidemia, unspecified: Secondary | ICD-10-CM

## 2020-08-16 DIAGNOSIS — I10 Essential (primary) hypertension: Secondary | ICD-10-CM

## 2020-08-16 DIAGNOSIS — I5033 Acute on chronic diastolic (congestive) heart failure: Secondary | ICD-10-CM

## 2020-08-16 DIAGNOSIS — E1140 Type 2 diabetes mellitus with diabetic neuropathy, unspecified: Secondary | ICD-10-CM

## 2020-08-17 ENCOUNTER — Ambulatory Visit: Payer: Medicaid Other | Admitting: Physical Therapy

## 2020-08-17 ENCOUNTER — Other Ambulatory Visit: Payer: Self-pay

## 2020-08-17 ENCOUNTER — Other Ambulatory Visit: Payer: Self-pay | Admitting: Internal Medicine

## 2020-08-17 ENCOUNTER — Other Ambulatory Visit: Payer: Self-pay | Admitting: Family Medicine

## 2020-08-17 DIAGNOSIS — E1140 Type 2 diabetes mellitus with diabetic neuropathy, unspecified: Secondary | ICD-10-CM

## 2020-08-17 DIAGNOSIS — Z794 Long term (current) use of insulin: Secondary | ICD-10-CM

## 2020-08-17 NOTE — Telephone Encounter (Signed)
Requested medications are due for refill today.  yes  Requested medications are on the active medications list.  yes  Last refill. 07/30/2020  Future visit scheduled.   Yes at [facility]  Notes to clinic.  No PCP per chart. Has been seen at [facility].

## 2020-08-18 ENCOUNTER — Other Ambulatory Visit: Payer: Self-pay

## 2020-08-19 ENCOUNTER — Other Ambulatory Visit: Payer: Self-pay

## 2020-08-19 MED ORDER — CYCLOBENZAPRINE HCL 5 MG PO TABS
5.0000 mg | ORAL_TABLET | Freq: Two times a day (BID) | ORAL | 0 refills | Status: DC | PRN
Start: 2020-08-19 — End: 2021-02-08
  Filled 2020-08-19: qty 15, 8d supply, fill #0

## 2020-08-22 ENCOUNTER — Encounter: Payer: Self-pay | Admitting: Physical Therapy

## 2020-08-22 ENCOUNTER — Other Ambulatory Visit: Payer: Self-pay

## 2020-08-22 ENCOUNTER — Ambulatory Visit: Payer: Medicaid Other | Attending: Internal Medicine | Admitting: Physical Therapy

## 2020-08-22 DIAGNOSIS — R2681 Unsteadiness on feet: Secondary | ICD-10-CM

## 2020-08-22 DIAGNOSIS — R2689 Other abnormalities of gait and mobility: Secondary | ICD-10-CM | POA: Diagnosis present

## 2020-08-22 DIAGNOSIS — R296 Repeated falls: Secondary | ICD-10-CM

## 2020-08-22 NOTE — Patient Instructions (Signed)
Access Code: [email or web address] URL: [email or web address] Date: 08/22/2020 Prepared by: Hilda Blades  Exercises Standing Marching - 1-2 x daily - 5-7 x weekly - 2 sets - 20 reps Standing Hip Abduction with Counter Support - 1-2 x daily - 5-7 x weekly - 2 sets - 10 reps Standing Hip Extension with Counter Support - 1-2 x daily - 5-7 x weekly - 2 sets - 10 reps Heel Raises with Counter Support - 1-2 x daily - 5-7 x weekly - 2 sets - 10 reps Sit to Stand with Armchair - 1-2 x daily - 5-7 x weekly - 3 sets - 5 reps Standing Romberg to 1/2 Tandem Stance - 2-3 x daily - 7 x weekly - 3 sets - 30 hold

## 2020-08-23 ENCOUNTER — Other Ambulatory Visit: Payer: Self-pay | Admitting: Family Medicine

## 2020-08-23 ENCOUNTER — Other Ambulatory Visit: Payer: Self-pay

## 2020-08-23 DIAGNOSIS — Z794 Long term (current) use of insulin: Secondary | ICD-10-CM

## 2020-08-23 DIAGNOSIS — E1140 Type 2 diabetes mellitus with diabetic neuropathy, unspecified: Secondary | ICD-10-CM

## 2020-08-23 NOTE — Therapy (Signed)
[place], [place], [address] Phone: [phone]   Fax:  [phone]  Physical Therapy Evaluation  Patient Details  Name: Ryan Jenkins MRN: [id number] Date of Birth: April 23, 1960 Referring Provider (PT): Florencia Reasons, MD   Encounter Date: 08/22/2020   PT End of Session - 08/22/20 1613     Visit Number 1    Number of Visits 12    Date for PT Re-Evaluation 10/03/20    Authorization Type [employer]    Authorization Time Period request authorization after 12th visit    Authorization - Visit Number 1    Authorization - Number of Visits 12    PT Start Time 4332    PT Stop Time 1700    PT Time Calculation (min) 45 min    Equipment Utilized During Treatment Gait belt    Activity Tolerance Patient tolerated treatment well    Behavior During Therapy [place] [facility] for tasks assessed/performed             Past Medical History:  Diagnosis Date   Abdominal pain 11/25/2018   Acid reflux 11/25/2018   Anxiety    Bilateral swelling of feet and ankles 11/25/2018   and legs   Change in bowel habits 11/25/2018   Constipation 11/25/2018   Depression    Diabetes mellitus without complication ([place])    Diabetic neuropathy ([place])    Diarrhea 11/25/2018   Fatigue 11/25/2018   GERD (gastroesophageal reflux disease)    Hypertension    Loss of appetite 11/25/2018   Muscle spasm 11/25/2018   Neuromuscular disorder ([place])    neuropathy   Retinopathy due to secondary diabetes ([place])    Sleep disturbances 11/25/2018   Vision changes 11/25/2018   Vitamin B12 deficiency     Past Surgical History:  Procedure Laterality Date   CATARACT EXTRACTION Bilateral 2017    There were no vitals filed for this visit.    Subjective Assessment - 08/22/20 1617     Subjective Patient reports he needs to work on balance mostly, states he would like to walk without a walker and try to use a cane if he can. He was hospitalized on October  2021 and has been using the walker since that time. He reports he has had PT in the past for balance and walking problems from peripheral neuropathy.    Pertinent History Bilateral peripheral neuropathy, bilateral knee pain    Limitations Standing;Walking;House hold activities    How long can you walk comfortably? 15 minutes    Patient Stated Goals He wants to  be able to walk without walker and balance better.    Currently in Pain? Yes    Pain Score 0-No pain    Pain Type Chronic pain                [facility] PT Assessment - 08/23/20 0001       Assessment   Medical Diagnosis History of falling    Referring Provider (PT) Florencia Reasons, MD    Onset Date/Surgical Date --   October 2021   Prior Therapy Yes      Precautions   Precautions Fall      Restrictions   Weight Bearing Restrictions No      Balance Screen   Has the patient fallen in the past 6 months Yes    How many times? 1    Has the patient had a decrease in activity level because of a fear of falling?  Yes    Is the patient reluctant to leave their home because of a fear of falling?  Yes      [place] Private residence    Living Arrangements Other relatives   brother   Type of [place] to enter    Entrance Stairs-Number of Steps 20    Entrance Stairs-Rails Right;Left;Can reach both    [place] One level    [place] - 2 wheels      Prior Function   Level of Independence Independent with basic ADLs;Independent with household mobility with device;Independent with community mobility with device      Cognition   Overall Cognitive Status Within Functional Limits for tasks assessed      Observation/Other Assessments   Observations Patient appears in no apparent distress    Focus on Therapeutic Outcomes (FOTO)  NA - MCD      ROM / Strength   AROM / PROM / Strength Strength      Strength   Strength Assessment Site Hip;Knee;Ankle    Right/Left  Hip Right;Left    Right Hip Flexion 4-/5    Right Hip Extension 3-/5    Right Hip ABduction 3-/5    Left Hip Flexion 4-/5    Left Hip Extension 3-/5    Left Hip ABduction 3-/5    Right/Left Knee Right;Left    Right Knee Flexion 4/5    Right Knee Extension 4/5    Left Knee Flexion 4/5    Left Knee Extension 4/5    Right/Left Ankle Right;Left    Right Ankle Dorsiflexion 3-/5    Right Ankle Plantar Flexion 3-/5    Left Ankle Dorsiflexion 3-/5    Left Ankle Plantar Flexion 3-/5      Ambulation/Gait   Ambulation/Gait Yes    Ambulation/Gait Assistance 6: Modified independent (Device/Increase time)    Ambulation Distance (Feet) 605 Feet    Assistive device Rolling walker      6 minute walk test results    Aerobic Endurance Distance Walked 605      Balance   Balance Assessed Yes      Standardized Balance Assessment   Standardized Balance Assessment Timed Up and Go Test;Berg Balance Test;Five Times Sit to Stand    Five times sit to stand comments  30 seconds   patient required to use BUE for support     Berg Balance Test   Sit to Stand Able to stand  independently using hands    Standing Unsupported Able to stand safely 2 minutes    Sitting with Back Unsupported but Feet Supported on Floor or Stool Able to sit safely and securely 2 minutes    Stand to Sit Controls descent by using hands    Transfers Able to transfer safely, definite need of hands    Standing Unsupported with Eyes Closed Able to stand 10 seconds with supervision    Standing Unsupported with Feet Together Able to place feet together independently and stand for 1 minute with supervision    From Standing, Reach Forward with Outstretched Arm Can reach forward >12 cm safely (5")    From Standing Position, Pick up Object from Floor Able to pick up shoe, needs supervision    From Standing Position, Turn to Look Behind Over each Shoulder Looks behind from both sides and weight shifts well    Turn 360 Degrees Able to turn  360 degrees safely but slowly  Standing Unsupported, Alternately Place Feet on Step/Stool Able to complete >2 steps/needs minimal assist    Standing Unsupported, One Foot in [employer] balance while stepping or standing    Standing on One Leg Tries to lift leg/unable to hold 3 seconds but remains standing independently    Total Score 37      Timed Up and Go Test   Normal TUG (seconds) 23                        Objective measurements completed on examination: See above findings.       [place] Adult PT Treatment/Exercise - 08/23/20 0001       Neuro Re-ed    Neuro Re-ed Details  1/2 tandem x 30 sec      Exercises   Exercises Knee/Hip      Knee/Hip Exercises: Standing   Heel Raises 10 reps    Hip Flexion 10 reps    Hip Abduction 10 reps    Hip Extension 10 reps      Knee/Hip Exercises: Seated   Sit to Sand 5 reps;with UE support                    PT Education - 08/22/20 1612     Education Details Exam findings, POC, HEP    Person(s) Educated Patient    Methods Explanation;Demonstration;Verbal cues;Handout    Comprehension Verbalized understanding;Returned demonstration;Verbal cues required;Need further instruction              PT Short Term Goals - 08/22/20 1614       PT SHORT TERM GOAL #1   Title Patient will be I with initial HEP to progress with PT    Baseline HEP provided at eval    Time 3    Period Weeks    Status New    Target Date 09/12/20      PT SHORT TERM GOAL #2   Title Patient will be able to perform sit<>stand without using UE for support to indicate improve LE strength    Baseline patient requires BUE for support with sit<>stand    Time 3    Period Weeks    Status New    Target Date 09/12/20               PT Long Term Goals - 08/22/20 1615       PT LONG TERM GOAL #1   Title Patient will be I with final HEP to maintain progress from PT    Baseline HEP provided at eval    Time 6    Period Weeks     Status New    Target Date 10/03/20      PT LONG TERM GOAL #2   Title Patient will improve 5xSTS to </= 20 seconds without UE support to indicate reduced fall risk    Baseline 30 seconds using BUE support    Time 6    Period Weeks    Status New    Target Date 10/03/20      PT LONG TERM GOAL #3   Title Patient will achieve >/= 47/56 on BERG to indicate he can safely use cane in the community    Baseline 37/56    Time 6    Period Weeks    Status New    Target Date 10/03/20      PT LONG TERM GOAL #4   Title Patient will be able to walk >/=  800 ft in 6 minutes using LRAD in order to improve community access    Baseline 605 ft    Time 6    Period Weeks    Status New    Target Date 10/03/20      PT LONG TERM GOAL #5   Title Patient will demonstrate TUG </= 13 seconds to indicate reduced fall risk    Baseline 23 seconds    Time 6    Period Weeks    Status New    Target Date 10/03/20                    Plan - 08/22/20 1624     Clinical Impression Statement Patient presents to PT with report of chronic balance, gait, and strength deficits due to previous hospitalization and peripheral neuropathy. Currently patient is ambulating using a cane which is appropriate based upon BERG balance assessment. He demonstrates high risk of falls with 5xSTS and TUG, as well as poor walking endurance with 6MWT. Patient also demostrates gross strength deficits of BLE likely from deconditioning and decreased activity. Patient was provided exercises to initiate strengthening and balance training, and he would benefit from continued skilled PT to progress his balance, walking ability and strength in order to maximize functional level and allow for ambulation with LRAD.    Personal Factors and Comorbidities Fitness;Past/Current Experience;Time since onset of injury/illness/exacerbation;Comorbidity 3+    Comorbidities See past medical history list    Examination-Activity Limitations Locomotion  Level;Squat;Stairs;Stand;Lift;Dressing    Examination-Participation Restrictions Meal Prep;Cleaning;Community Activity;Shop;Laundry;Yard Work    Merchant [employer] officer Stable/Uncomplicated    Designer, jewellery Low    Rehab Potential Fair    PT Frequency 2x / week    PT Duration 6 weeks    PT Treatment/Interventions ADLs/Self Care Home Management;Aquatic Therapy;Cryotherapy;Electrical Stimulation;Iontophoresis 4mg /ml Dexamethasone;Moist Heat;Traction;Ultrasound;Neuromuscular re-education;Balance training;Therapeutic exercise;Therapeutic activities;Functional mobility training;Stair training;Gait training;Patient/family education;Manual techniques;Dry needling;Passive range of motion;Taping;Vasopneumatic Device;Spinal Manipulations;Joint Manipulations    PT Next Visit Plan Review HEP and progress PRN, progress general LE strengthening and balance training, gait training using cane when applicable    Consulted and Agree with Plan of Care Patient             Patient will benefit from skilled therapeutic intervention in order to improve the following deficits and impairments:  Abnormal gait, Decreased range of motion, Difficulty walking, Decreased endurance, Decreased activity tolerance, Decreased balance, Decreased strength, Impaired sensation  Visit Diagnosis: Other abnormalities of gait and mobility  Unsteadiness on feet  Repeated falls     Problem List Patient Active Problem List   Diagnosis Date Noted   Acute CHF (congestive heart failure) ([place]) 08/03/2020   CKD (chronic kidney disease) stage 4, GFR 15-29 ml/min ([place]) 07/07/2020   Chest pain 07/07/2020   Acute on chronic diastolic CHF (congestive heart failure) ([place]) 07/07/2020   Erectile dysfunction associated with type 2 diabetes mellitus ([place]) 08/07/2019   Hives 08/07/2019   Vitreous hemorrhage of left eye ([place]) 07/22/2019   Elevated troponin 07/22/2019   Vision loss of left eye 07/22/2019   History of  medication noncompliance 04/02/2019   Gastric ulcer without hemorrhage or perforation 12/25/2018   CKD (chronic kidney disease), stage III ([place]) 12/25/2018   Gastroesophageal reflux disease without esophagitis 09/04/2018   Diabetic retinopathy of both eyes associated with type 2 diabetes mellitus ([place]) 01/03/2018   Intermittent diarrhea 09/27/2017   Gastroparesis 09/27/2017   Macroalbuminuric diabetic nephropathy ([place]) 06/25/2017   History of falling 06/25/2017   Hyperlipidemia  03/21/2017   Vitamin B 12 deficiency 03/21/2017   Uncontrolled type 2 diabetes mellitus with peripheral neuropathy ([place]) 02/05/2017   Essential hypertension 02/05/2017   Depression 02/05/2017   Unintended weight loss 02/05/2017   Gait disturbance 02/05/2017   Pronation deformity of both feet 05/11/2014   Diabetic neuropathy, type II diabetes mellitus ([place]) 05/11/2014   Metatarsal deformity 05/11/2014    Hilda Blades, PT, DPT, LAT, ATC 08/23/20  8:26 AM Phone: [phone] Fax: [facility] [address] [place], [place], [address] Phone: [phone]   Fax:  [phone]  Name: Ryan Jenkins MRN: [id number] Date of Birth: [birth year]

## 2020-08-24 ENCOUNTER — Other Ambulatory Visit: Payer: Self-pay

## 2020-08-24 MED ORDER — LISINOPRIL 5 MG PO TABS
5.0000 mg | ORAL_TABLET | Freq: Every day | ORAL | 6 refills | Status: DC
  Filled 2020-08-24: qty 30, 30d supply, fill #0

## 2020-08-26 ENCOUNTER — Other Ambulatory Visit: Payer: Self-pay

## 2020-08-26 MED ORDER — CEFDINIR 300 MG PO CAPS
ORAL_CAPSULE | ORAL | 0 refills | Status: DC
  Filled 2020-08-26: qty 5, 5d supply, fill #0

## 2020-08-30 ENCOUNTER — Other Ambulatory Visit: Payer: Self-pay

## 2020-08-30 ENCOUNTER — Ambulatory Visit: Payer: Medicaid Other | Admitting: Physical Therapy

## 2020-08-31 ENCOUNTER — Other Ambulatory Visit: Payer: Self-pay

## 2020-09-02 ENCOUNTER — Telehealth: Payer: Self-pay | Admitting: Physical Therapy

## 2020-09-02 ENCOUNTER — Other Ambulatory Visit: Payer: Self-pay

## 2020-09-02 ENCOUNTER — Ambulatory Visit: Payer: Medicaid Other | Admitting: Physical Therapy

## 2020-09-02 NOTE — Telephone Encounter (Signed)
Called patient regarding his missed appt today and he stated he has been sick for a week.  Asked that he make his next appt or at least call to cancel due to the cancellation/no show policy.  He plans to make his appt Tues. 09/06/20.  Raeford Razor, PT 09/02/20 12:40 PM Phone: [phone] Fax: [phone]

## 2020-09-05 ENCOUNTER — Ambulatory Visit: Payer: Medicaid Other | Admitting: Internal Medicine

## 2020-09-06 ENCOUNTER — Telehealth: Payer: Self-pay | Admitting: Physical Therapy

## 2020-09-06 ENCOUNTER — Ambulatory Visit: Payer: Medicaid Other | Admitting: Physical Therapy

## 2020-09-06 NOTE — Telephone Encounter (Signed)
Attempted to contact patient due to missed PT appointment. Patient informed of missed appointment and advised of attendance policy. Informed patient of next scheduled appointment on 09/09/2020, and remaining appointments would be canceled per attendance policy.  Hilda Blades, PT, DPT, LAT, ATC 09/06/20  4:57 PM Phone: [phone] Fax: [phone]

## 2020-09-08 ENCOUNTER — Other Ambulatory Visit: Payer: Self-pay

## 2020-09-09 ENCOUNTER — Ambulatory Visit: Payer: Medicaid Other | Admitting: Physical Therapy

## 2020-09-09 ENCOUNTER — Telehealth: Payer: Self-pay | Admitting: Physical Therapy

## 2020-09-09 NOTE — Telephone Encounter (Signed)
Spoke with patient due to missed PT appointment. Patient states he called the clinic this morning and was told his insurance was not accepted and he would have to pay out of pocket so patient elected to be discharged from therapy. Patient was advised of attendance policy and that if he would like to return to therapy he would need a new PT referral. Patient expressed understanding.  Hilda Blades, PT, DPT, LAT, ATC 09/09/20  12:13 PM Phone: [phone] Fax: [phone]

## 2020-09-13 ENCOUNTER — Other Ambulatory Visit: Payer: Self-pay

## 2020-09-13 ENCOUNTER — Ambulatory Visit: Payer: Medicaid Other | Admitting: Physical Therapy

## 2020-09-13 ENCOUNTER — Other Ambulatory Visit: Payer: Self-pay | Admitting: Family Medicine

## 2020-09-13 DIAGNOSIS — R1110 Vomiting, unspecified: Secondary | ICD-10-CM

## 2020-09-13 NOTE — Telephone Encounter (Signed)
Requested medications are due for refill today.  yes  Requested medications are on the active medications list.  yes  Last refill. 07/20/2020 & 08/19/2020  Future visit scheduled.   Yes with Amy Minette Brine @ [facility]  Notes to clinic.  Medication not delegated. No PCP is listed.

## 2020-09-14 ENCOUNTER — Ambulatory Visit: Payer: Medicaid Other | Admitting: Skilled Nursing Facility1

## 2020-09-15 ENCOUNTER — Encounter: Payer: Medicaid Other | Admitting: Physical Therapy

## 2020-09-16 ENCOUNTER — Other Ambulatory Visit: Payer: Self-pay

## 2020-09-18 NOTE — Progress Notes (Signed)
Erroneous encounter

## 2020-09-20 ENCOUNTER — Encounter: Payer: Self-pay | Admitting: Family

## 2020-09-20 ENCOUNTER — Telehealth: Payer: Self-pay | Admitting: Family

## 2020-09-20 ENCOUNTER — Other Ambulatory Visit: Payer: Self-pay

## 2020-09-20 ENCOUNTER — Ambulatory Visit
Admission: EM | Admit: 2020-09-20 | Discharge: 2020-09-20 | Disposition: A | Discharge status: Home / Self Care | Payer: Medicaid Other | Attending: Internal Medicine | Admitting: Internal Medicine

## 2020-09-20 DIAGNOSIS — E785 Hyperlipidemia, unspecified: Secondary | ICD-10-CM

## 2020-09-20 DIAGNOSIS — R350 Frequency of micturition: Secondary | ICD-10-CM

## 2020-09-20 DIAGNOSIS — R300 Dysuria: Secondary | ICD-10-CM

## 2020-09-20 DIAGNOSIS — E1140 Type 2 diabetes mellitus with diabetic neuropathy, unspecified: Secondary | ICD-10-CM

## 2020-09-20 DIAGNOSIS — I10 Essential (primary) hypertension: Secondary | ICD-10-CM

## 2020-09-20 DIAGNOSIS — Z09 Encounter for follow-up examination after completed treatment for conditions other than malignant neoplasm: Secondary | ICD-10-CM

## 2020-09-20 DIAGNOSIS — N184 Chronic kidney disease, stage 4 (severe): Secondary | ICD-10-CM

## 2020-09-20 DIAGNOSIS — I5033 Acute on chronic diastolic (congestive) heart failure: Secondary | ICD-10-CM

## 2020-09-20 NOTE — Progress Notes (Signed)
Erroneous encounter

## 2020-09-20 NOTE — ED Triage Notes (Signed)
One week h/o cloudy urine, dysuria and urinary urgency. Denies abdominal pain, hematuria and urinary frequency.   Per Pt he has a h/o neuropathy and c/o sxs worsening within the last two weeks. Sxs are affecting his ambulation, noting increased fatigue.

## 2020-09-20 NOTE — Telephone Encounter (Signed)
Pt state having high sugars level and issues with yeast infection pt was also giving the mobile unit information today as a backup if needed last seen 07-13-20 had an appt for today 09/20/20 showed an hour late

## 2020-09-20 NOTE — ED Provider Notes (Signed)
[facility]    CSN: [id number] Arrival date & time: 09/20/20  1509      History   Chief Complaint Chief Complaint  Patient presents with   Dysuria   Generalized Body Aches    HPI Ryan Jenkins is a 60 y.o. male.   Patient presents with 1 week history of cloudy colored urine, dysuria, urinary urgency, urinary frequency.  Denies any lower abdominal pain, hematuria, pelvic pain, back pain, fever, penile discharge.  No concern for STD.  Patient states that he has had four urinary tract infections since the month of October after being hospitalized and having a Foley catheter in place.  Patient was hospitalized for CHF exacerbation and complications with his chronic kidney disease.  Was on life support for approximately 1 week per patient.  Patient states that he has been urinating appropriately recently since symptoms started.  Patient also has concerns for worsening peripheral neuropathy.  Patient states that neuropathic pain has been worsening over the past 2 weeks and is causing issues with ambulation. Walks with walker at baseline. Also having increased fatigue.  Patient was previously on gabapentin, but it was stopped by PCP a few months prior due to risk of kidney damage.  Patient states that he has not been taking any other medications for neuropathy since this.  Although, patient currently has nortriptyline and tramadol on medication list.  Patient denies any chest pain, shortness of breath, fever, headache, dizziness, blurred vision, nausea, vomiting, abdominal pain saddle anesthesia, urinary or bowel incontinence.   Dysuria  Past Medical History:  Diagnosis Date   Abdominal pain 11/25/2018   Acid reflux 11/25/2018   Anxiety    Bilateral swelling of feet and ankles 11/25/2018   and legs   Change in bowel habits 11/25/2018   Constipation 11/25/2018   Depression    Diabetes mellitus without complication ([place])    Diabetic neuropathy ([place])    Diarrhea  11/25/2018   Fatigue 11/25/2018   GERD (gastroesophageal reflux disease)    Hypertension    Loss of appetite 11/25/2018   Muscle spasm 11/25/2018   Neuromuscular disorder ([place])    neuropathy   Retinopathy due to secondary diabetes ([place])    Sleep disturbances 11/25/2018   Vision changes 11/25/2018   Vitamin B12 deficiency     Patient Active Problem List   Diagnosis Date Noted   Acute CHF (congestive heart failure) ([place]) 08/03/2020   CKD (chronic kidney disease) stage 4, GFR 15-29 ml/min ([place]) 07/07/2020   Chest pain 07/07/2020   Acute on chronic diastolic CHF (congestive heart failure) ([place]) 07/07/2020   Erectile dysfunction associated with type 2 diabetes mellitus ([place]) 08/07/2019   Hives 08/07/2019   Vitreous hemorrhage of left eye ([place]) 07/22/2019   Elevated troponin 07/22/2019   Vision loss of left eye 07/22/2019   History of medication noncompliance 04/02/2019   Gastric ulcer without hemorrhage or perforation 12/25/2018   CKD (chronic kidney disease), stage III ([place]) 12/25/2018   Gastroesophageal reflux disease without esophagitis 09/04/2018   Diabetic retinopathy of both eyes associated with type 2 diabetes mellitus ([place]) 01/03/2018   Intermittent diarrhea 09/27/2017   Gastroparesis 09/27/2017   Macroalbuminuric diabetic nephropathy ([place]) 06/25/2017   History of falling 06/25/2017   Hyperlipidemia 03/21/2017   Vitamin B 12 deficiency 03/21/2017   Uncontrolled type 2 diabetes mellitus with peripheral neuropathy ([place]) 02/05/2017   Essential hypertension 02/05/2017   Depression 02/05/2017   Unintended weight loss 02/05/2017   Gait disturbance 02/05/2017   Pronation deformity  of both feet 05/11/2014   Diabetic neuropathy, type II diabetes mellitus ([place]) 05/11/2014   Metatarsal deformity 05/11/2014    Past Surgical History:  Procedure Laterality Date   CATARACT EXTRACTION Bilateral 2017       Home Medications    Prior to Admission medications   Medication Sig  Start Date End Date Taking? Authorizing Provider  acetaminophen (TYLENOL) 500 MG tablet Take 1,000 mg by mouth in the morning and at bedtime.    [provider]  amLODipine (NORVASC) 10 MG tablet Take 1 tablet (10 mg total) by mouth daily. 07/20/20 09/18/20  Charlott Rakes, MD  atorvastatin (LIPITOR) 40 MG tablet Take 1 tablet (40 mg total) by mouth daily. 07/20/20   Charlott Rakes, MD  Blood Glucose Monitoring Suppl (TRUE METRIX METER) w/Device KIT Use as directed 07/20/20   Ladell Pier, MD  Blood Pressure Monitor DEVI Use as directed to check home blood pressure 2-3 times a week 07/13/20   Camillia Herter, NP  cefdinir (OMNICEF) 300 MG capsule Take 1 capsule by mouth daily 08/26/20     chlorthalidone (HYGROTON) 25 MG tablet Take 0.5 tablets (12.5 mg total) by mouth every other day. 07/20/20   Charlott Rakes, MD  cloNIDine (CATAPRES - DOSED IN MG/24 HR) 0.1 mg/24hr patch Place 1 patch (0.1 mg total) onto the skin once a week. 07/20/20   Charlott Rakes, MD  cyclobenzaprine (FLEXERIL) 5 MG tablet Take 1 tablet (5 mg total) by mouth 2 (two) times daily as needed for muscle spasms (medication can cause drowsiness). 08/19/20   Charlott Rakes, MD  D-Mannose 350 MG CAPS Take 1,050 capsules by mouth in the morning and at bedtime.    [provider]  escitalopram (LEXAPRO) 20 MG tablet Take 1 tablet (20 mg total) by mouth daily. 07/20/20   Charlott Rakes, MD  finasteride (PROSCAR) 5 MG tablet Take 1 tablet (5 mg total) by mouth daily. 07/20/20   Charlott Rakes, MD  furosemide (LASIX) 40 MG tablet Take 1 tablet (40 mg total) by mouth daily as needed for fluid or edema. 08/04/20 09/07/20  Florencia Reasons, MD  glucose blood (TRUE METRIX BLOOD GLUCOSE TEST) test strip Use as instructed 07/20/20   Ladell Pier, MD  hydrALAZINE (APRESOLINE) 100 MG tablet Take 1 tablet (100 mg total) by mouth 3 (three) times daily. 07/20/20   Charlott Rakes, MD  hydrALAZINE (APRESOLINE) 25 MG tablet Take 1 tablet (25 mg  total) by mouth 3 (three) times daily. 07/20/20   Charlott Rakes, MD  insulin detemir (LEVEMIR) 100 UNIT/ML injection Inject 0.1 mLs (10 Units total) into the skin daily. 07/20/20   Charlott Rakes, MD  Insulin Syringe-Needle U-100 (TRUEPLUS INSULIN SYRINGE) 31G X 5/16" 0.3 ML MISC Use to inject Levemir at bedtime. 07/20/20   Charlott Rakes, MD  lisinopril (ZESTRIL) 5 MG tablet Take 1 tablet by mouth daily 08/24/20     loperamide (IMODIUM) 2 MG capsule Take 2 mg by mouth See admin instructions. Give 1 tablet by mouth every 2 hours as needed for each loose stool not to exceed 15m in 24 hours.    [provider]  metoprolol succinate (TOPROL-XL) 100 MG 24 hr tablet Take 1.5 tablets (150 mg total) by mouth at bedtime. Take with or immediately following a meal. 07/20/20   NCharlott Rakes MD  Multiple Vitamins-Minerals (MULTIVITAMIN WITH MINERALS) tablet Take 1 tablet by mouth daily.    [provider]  nortriptyline (PAMELOR) 25 MG capsule Take 1 capsule (25 mg total)  by mouth at bedtime. 07/20/20   Charlott Rakes, MD  ondansetron (ZOFRAN) 4 MG tablet Take 1 tablet (4 mg total) by mouth every 8 (eight) hours as needed for nausea or vomiting. 07/20/20   Charlott Rakes, MD  pantoprazole (PROTONIX) 40 MG tablet Take 1 tablet (40 mg total) by mouth daily. 07/09/20 08/08/20  Darliss Cheney, MD  tadalafil (CIALIS) 5 MG tablet Take 1 tablet (5 mg total) by mouth daily. 07/20/20   Charlott Rakes, MD  tamsulosin (FLOMAX) 0.4 MG CAPS capsule Take 1 capsule (0.4 mg total) by mouth daily after breakfast. 07/20/20   Charlott Rakes, MD  traMADol (ULTRAM) 50 MG tablet Take 50 mg by mouth 2 (two) times daily.    [provider]  traZODone (DESYREL) 50 MG tablet Take 3 tablets (150 mg total) by mouth at bedtime. 07/20/20   Charlott Rakes, MD  TRUEplus Lancets 28G MISC Use as directed 07/20/20   Ladell Pier, MD    Family History Family History  Problem Relation Age of Onset   Renal cancer Mother     Hypertension Mother    Pancreatic cancer Mother    Hypertension Sister    Stroke Sister    Leukemia Maternal Uncle    Sickle cell trait Maternal Aunt    Colon cancer Neg Hx    Esophageal cancer Neg Hx    Rectal cancer Neg Hx    Stomach cancer Neg Hx     Social History Social History   Tobacco Use   Smoking status: Never   Smokeless tobacco: Never  Vaping Use   Vaping Use: Never used  Substance Use Topics   Alcohol use: No    Alcohol/week: 0.0 standard drinks   Drug use: No     Allergies   Claritin [loratadine], Hydrochlorothiazide, Latex, Lyrica [pregabalin], and Metformin and related   Review of Systems Review of Systems Per HPI  Physical Exam Triage Vital Signs ED Triage Vitals  Enc Vitals Group     BP 09/20/20 1625 124/68     Pulse Rate 09/20/20 1625 66     Resp 09/20/20 1625 18     Temp 09/20/20 1625 98.1 F (36.7 C)     Temp Source 09/20/20 1625 Oral     SpO2 09/20/20 1625 96 %     Weight --      Height --      Head Circumference --      Peak Flow --      Pain Score 09/20/20 1627 8     Pain Loc --      Pain Edu? --      Excl. in [place]? --    No data found.  Updated Vital Signs BP 124/68 (BP Location: Left Arm)   Pulse 66   Temp 98.1 F (36.7 C) (Oral)   Resp 18   SpO2 96%   Visual Acuity Right Eye Distance:   Left Eye Distance:   Bilateral Distance:    Right Eye Near:   Left Eye Near:    Bilateral Near:     Physical Exam Constitutional:      General: He is not in acute distress.    Appearance: Normal appearance. He is not ill-appearing, toxic-appearing or diaphoretic.  HENT:     Head: Normocephalic and atraumatic.  Eyes:     Extraocular Movements: Extraocular movements intact.     Conjunctiva/sclera: Conjunctivae normal.  Cardiovascular:     Rate and Rhythm: Normal rate and regular rhythm.  Pulses: Normal pulses.     Heart sounds: Normal heart sounds.  Pulmonary:     Effort: Pulmonary effort is normal.     Breath sounds:  Normal breath sounds.  Abdominal:     General: Abdomen is flat. Bowel sounds are normal.     Palpations: Abdomen is soft.  Musculoskeletal:        General: No swelling, tenderness, deformity or signs of injury. Normal range of motion.     Right lower leg: No edema.     Left lower leg: No edema.  Skin:    General: Skin is warm and dry.  Neurological:     General: No focal deficit present.     Mental Status: He is alert and oriented to person, place, and time. Mental status is at baseline.  Psychiatric:        Mood and Affect: Mood normal.        Behavior: Behavior normal.        Thought Content: Thought content normal.        Judgment: Judgment normal.     UC Treatments / Results  Labs (all labs ordered are listed, but only abnormal results are displayed) Labs Reviewed  URINE CULTURE  POCT URINALYSIS DIP (MANUAL ENTRY)    EKG   Radiology No results found.  Procedures Procedures (including critical care time)  Medications Ordered in UC Medications - No data to display  Initial Impression / Assessment and Plan / UC Course  I have reviewed the triage vital signs and the nursing notes.  Pertinent labs & imaging results that were available during my care of the patient were reviewed by me and considered in my medical decision making (see chart for details).     Patient was unable to provide urine sample for urinalysis and urine culture.  Discussed performing an I&O cath to obtain urine sample but patient declined.  Patient was given the option to bring a urine sample back tomorrow, and patient agreed.  While in the process of ordering urine culture and medication to treat urinary tract infection, patient came out of room stating that he would rather be evaluated and treated at the hospital instead of at urgent care.  Discussed that it is not necessary for patient to be further evaluated tat he hospital given patient's clinical symptoms as this can be treated outpatient, but  patient declined further care here at the urgent care and wished to be seen at the hospital for further management.  Declined any medications or urinalysis at this time.  Patient stated that he was leaving to go to [facility].  Regarding worsening peripheral neuropathy, patient was advised that he needs to follow-up with his primary care physician for further evaluation and management.  No red flags seen at this time.  No worsening signs of CHF, shortness of breath, chest pain, neurological signs that could indicate causes of worsening peripheral neuropathy.  Patient does state that blood sugars have been ranging in the 300s recently, so it is possible that this could be because of worsening peripheral neuropathy.  No signs on physical exam to indicate further evaluation at the hospital at this time.  Final Clinical Impressions(s) / UC Diagnoses   Final diagnoses:  Dysuria  Urinary frequency   Discharge Instructions   None    ED Prescriptions   None    PDMP not reviewed this encounter.   Odis Luster, FNP 09/20/20 [phone]

## 2020-09-21 ENCOUNTER — Encounter: Payer: Medicaid Other | Admitting: Physical Therapy

## 2020-09-21 ENCOUNTER — Encounter: Payer: Medicaid Other | Admitting: Family

## 2020-09-21 ENCOUNTER — Other Ambulatory Visit: Payer: Self-pay

## 2020-09-21 ENCOUNTER — Other Ambulatory Visit: Payer: Self-pay | Admitting: Family Medicine

## 2020-09-21 DIAGNOSIS — E1140 Type 2 diabetes mellitus with diabetic neuropathy, unspecified: Secondary | ICD-10-CM

## 2020-09-21 DIAGNOSIS — N184 Chronic kidney disease, stage 4 (severe): Secondary | ICD-10-CM

## 2020-09-21 DIAGNOSIS — Z13220 Encounter for screening for lipoid disorders: Secondary | ICD-10-CM

## 2020-09-21 DIAGNOSIS — I5033 Acute on chronic diastolic (congestive) heart failure: Secondary | ICD-10-CM

## 2020-09-21 DIAGNOSIS — I10 Essential (primary) hypertension: Secondary | ICD-10-CM

## 2020-09-22 ENCOUNTER — Other Ambulatory Visit: Payer: Self-pay

## 2020-09-22 NOTE — Telephone Encounter (Signed)
Provider no longer at this practice

## 2020-09-23 ENCOUNTER — Encounter: Payer: Medicaid Other | Admitting: Physical Therapy

## 2020-09-26 ENCOUNTER — Other Ambulatory Visit: Payer: Self-pay

## 2020-09-27 ENCOUNTER — Other Ambulatory Visit (HOSPITAL_COMMUNITY): Payer: Self-pay

## 2020-09-28 ENCOUNTER — Other Ambulatory Visit: Payer: Self-pay

## 2020-09-29 ENCOUNTER — Ambulatory Visit (HOSPITAL_BASED_OUTPATIENT_CLINIC_OR_DEPARTMENT_OTHER): Payer: Self-pay | Admitting: Cardiovascular Disease

## 2020-10-19 ENCOUNTER — Inpatient Hospital Stay: Payer: Medicaid Other | Admitting: Family

## 2020-10-24 ENCOUNTER — Inpatient Hospital Stay: Payer: Medicaid Other | Admitting: Family

## 2020-10-24 NOTE — Progress Notes (Signed)
Erroneous encounter

## 2020-10-25 ENCOUNTER — Encounter: Payer: Medicaid Other | Admitting: Family

## 2020-10-25 DIAGNOSIS — N3090 Cystitis, unspecified without hematuria: Secondary | ICD-10-CM

## 2020-10-25 DIAGNOSIS — Z09 Encounter for follow-up examination after completed treatment for conditions other than malignant neoplasm: Secondary | ICD-10-CM

## 2020-12-10 ENCOUNTER — Emergency Department (HOSPITAL_COMMUNITY): Payer: Medicaid Other

## 2020-12-10 ENCOUNTER — Emergency Department (HOSPITAL_COMMUNITY)
Admission: EM | Admit: 2020-12-10 | Discharge: 2020-12-10 | Disposition: A | Discharge status: Home / Self Care | Payer: Medicaid Other | Attending: Emergency Medicine | Admitting: Emergency Medicine

## 2020-12-10 ENCOUNTER — Other Ambulatory Visit: Payer: Self-pay

## 2020-12-10 DIAGNOSIS — N184 Chronic kidney disease, stage 4 (severe): Secondary | ICD-10-CM | POA: Diagnosis not present

## 2020-12-10 DIAGNOSIS — Z79899 Other long term (current) drug therapy: Secondary | ICD-10-CM | POA: Diagnosis not present

## 2020-12-10 DIAGNOSIS — I5033 Acute on chronic diastolic (congestive) heart failure: Secondary | ICD-10-CM | POA: Insufficient documentation

## 2020-12-10 DIAGNOSIS — Z794 Long term (current) use of insulin: Secondary | ICD-10-CM | POA: Insufficient documentation

## 2020-12-10 DIAGNOSIS — E11319 Type 2 diabetes mellitus with unspecified diabetic retinopathy without macular edema: Secondary | ICD-10-CM | POA: Insufficient documentation

## 2020-12-10 DIAGNOSIS — M7581 Other shoulder lesions, right shoulder: Secondary | ICD-10-CM

## 2020-12-10 DIAGNOSIS — M25711 Osteophyte, right shoulder: Secondary | ICD-10-CM | POA: Diagnosis not present

## 2020-12-10 DIAGNOSIS — R079 Chest pain, unspecified: Secondary | ICD-10-CM | POA: Diagnosis present

## 2020-12-10 DIAGNOSIS — E1140 Type 2 diabetes mellitus with diabetic neuropathy, unspecified: Secondary | ICD-10-CM | POA: Diagnosis not present

## 2020-12-10 DIAGNOSIS — Z91040 Latex allergy status: Secondary | ICD-10-CM | POA: Insufficient documentation

## 2020-12-10 DIAGNOSIS — I130 Hypertensive heart and chronic kidney disease with heart failure and stage 1 through stage 4 chronic kidney disease, or unspecified chronic kidney disease: Secondary | ICD-10-CM | POA: Insufficient documentation

## 2020-12-10 DIAGNOSIS — E1121 Type 2 diabetes mellitus with diabetic nephropathy: Secondary | ICD-10-CM | POA: Diagnosis not present

## 2020-12-10 LAB — CBC
HCT: 38.3 % — ABNORMAL LOW (ref 39.0–52.0)
Hemoglobin: 12.9 g/dL — ABNORMAL LOW (ref 13.0–17.0)
MCH: 29.9 pg (ref 26.0–34.0)
MCHC: 33.7 g/dL (ref 30.0–36.0)
MCV: 88.7 fL (ref 80.0–100.0)
Platelets: 333 10*3/uL (ref 150–400)
RBC: 4.32 MIL/uL (ref 4.22–5.81)
RDW: 13.4 % (ref 11.5–15.5)
WBC: 6.7 10*3/uL (ref 4.0–10.5)
nRBC: 0 % (ref 0.0–0.2)

## 2020-12-10 LAB — BASIC METABOLIC PANEL
Anion gap: 8 (ref 5–15)
BUN: 35 mg/dL — ABNORMAL HIGH (ref 6–20)
CO2: 23 mmol/L (ref 22–32)
Calcium: 9.1 mg/dL (ref 8.9–10.3)
Chloride: 102 mmol/L (ref 98–111)
Creatinine, Ser: 2.94 mg/dL — ABNORMAL HIGH (ref 0.61–1.24)
GFR, Estimated: 24 mL/min — ABNORMAL LOW (ref >60)
Glucose, Bld: 387 mg/dL — ABNORMAL HIGH (ref 70–99)
Potassium: 4.7 mmol/L (ref 3.5–5.1)
Sodium: 133 mmol/L — ABNORMAL LOW (ref 135–145)

## 2020-12-10 LAB — TROPONIN I (HIGH SENSITIVITY)
Troponin I (High Sensitivity): 47 ng/L — ABNORMAL HIGH (ref <18)
Troponin I (High Sensitivity): 52 ng/L — ABNORMAL HIGH (ref <18)

## 2020-12-10 LAB — VITAMIN B12: Vitamin B-12: 399 pg/mL (ref 180–914)

## 2020-12-10 MED ORDER — GABAPENTIN 100 MG PO CAPS: 200.0000 mg | ORAL_CAPSULE | Freq: Every day | ORAL | 0 refills | Status: DC

## 2020-12-10 MED ORDER — KETOROLAC TROMETHAMINE 15 MG/ML IJ SOLN
15.0000 mg | Freq: Once | INTRAMUSCULAR | Status: AC
  Administered 2020-12-10: 15 mg via INTRAVENOUS
  Filled 2020-12-10: qty 1

## 2020-12-10 MED ORDER — ASPIRIN 325 MG PO TABS
325.0000 mg | ORAL_TABLET | Freq: Every day | ORAL | Status: DC
  Administered 2020-12-10: 325 mg via ORAL
  Filled 2020-12-10: qty 1

## 2020-12-10 MED ORDER — HYDRALAZINE HCL 25 MG PO TABS
100.0000 mg | ORAL_TABLET | Freq: Once | ORAL | Status: AC
  Administered 2020-12-10: 100 mg via ORAL
  Filled 2020-12-10: qty 4

## 2020-12-10 MED ORDER — ONDANSETRON HCL 4 MG/2ML IJ SOLN
4.0000 mg | Freq: Once | INTRAMUSCULAR | Status: AC
  Administered 2020-12-10: 4 mg via INTRAVENOUS
  Filled 2020-12-10: qty 2

## 2020-12-10 MED ORDER — AMLODIPINE BESYLATE 5 MG PO TABS
10.0000 mg | ORAL_TABLET | Freq: Once | ORAL | Status: AC
  Administered 2020-12-10: 10 mg via ORAL
  Filled 2020-12-10: qty 2

## 2020-12-10 NOTE — ED Triage Notes (Signed)
Patient reports pain started in right arm at 330 am , then started vomiting, developed chest pain centralized around 6 am. Says he has neuropathy but this pain is different. Pain 8/10.

## 2020-12-10 NOTE — Discharge Instructions (Addendum)
Your work-up is reassuring today.  You do have bone spurs in your right shoulder that likely are causing your nerve pain.  I am restarting you on your gabapentin however you might only have enough for 3 weeks.  You need to follow-up with a specialist as well as a primary care provider about your symptoms.  I am attaching a number for you to call to get established.

## 2020-12-10 NOTE — ED Provider Notes (Addendum)
[facility] DEPT Provider Note   CSN: [id number] Arrival date & time: 12/10/20  8115     History Chief Complaint  Patient presents with   Chest Pain    Ryan Jenkins is a 60 y.o. male past medical history of vitamin B12 deficiency, diabetic neuropathy, hypertension and anxiety presenting today with multiple complaints.  He reports that around 130 his right shoulder began to hurt with pain all down his arm.  At 330 it worked him out of his sleep and he called an ambulance.  History of neuropathy but reports that this feels different.  Also states that he was nauseous and vomiting 5 days ago, it got better however he started to be nauseous and vomited again this morning.  Also complaining of back pain which is chronic.  Currently being treated for urinary tract infection with Bactrim. Does not feel as though his dysuria is improving.  States that he was seeing [facility] for his neuropathy however he then began to see a different clinic where he had difficulties with appointment scheduling.  Previously was on gabapentin however ran out of refills.  Also taking B12 for his continued deficiency.  He has not used anything to try and relieve his nerve pain and does not feel as though anything makes it worse.  "It is always deep inside and painful."  Requesting a shoulder x-ray.  No trauma.    Past Medical History:  Diagnosis Date   Abdominal pain 11/25/2018   Acid reflux 11/25/2018   Anxiety    Bilateral swelling of feet and ankles 11/25/2018   and legs   Change in bowel habits 11/25/2018   Constipation 11/25/2018   Depression    Diabetes mellitus without complication ([place])    Diabetic neuropathy ([place])    Diarrhea 11/25/2018   Fatigue 11/25/2018   GERD (gastroesophageal reflux disease)    Hypertension    Loss of appetite 11/25/2018   Muscle spasm 11/25/2018   Neuromuscular disorder ([place])    neuropathy   Retinopathy due to secondary  diabetes ([place])    Sleep disturbances 11/25/2018   Vision changes 11/25/2018   Vitamin B12 deficiency     Patient Active Problem List   Diagnosis Date Noted   Acute CHF (congestive heart failure) ([place]) 08/03/2020   CKD (chronic kidney disease) stage 4, GFR 15-29 ml/min ([place]) 07/07/2020   Chest pain 07/07/2020   Acute on chronic diastolic CHF (congestive heart failure) ([place]) 07/07/2020   Erectile dysfunction associated with type 2 diabetes mellitus ([place]) 08/07/2019   Hives 08/07/2019   Vitreous hemorrhage of left eye ([place]) 07/22/2019   Elevated troponin 07/22/2019   Vision loss of left eye 07/22/2019   History of medication noncompliance 04/02/2019   Gastric ulcer without hemorrhage or perforation 12/25/2018   CKD (chronic kidney disease), stage III ([place]) 12/25/2018   Gastroesophageal reflux disease without esophagitis 09/04/2018   Diabetic retinopathy of both eyes associated with type 2 diabetes mellitus ([place]) 01/03/2018   Intermittent diarrhea 09/27/2017   Gastroparesis 09/27/2017   Macroalbuminuric diabetic nephropathy ([place]) 06/25/2017   History of falling 06/25/2017   Hyperlipidemia 03/21/2017   Vitamin B 12 deficiency 03/21/2017   Uncontrolled type 2 diabetes mellitus with peripheral neuropathy 02/05/2017   Essential hypertension 02/05/2017   Depression 02/05/2017   Unintended weight loss 02/05/2017   Gait disturbance 02/05/2017   Pronation deformity of both feet 05/11/2014   Diabetic neuropathy, type II diabetes mellitus ([place]) 05/11/2014   Metatarsal deformity 05/11/2014  Past Surgical History:  Procedure Laterality Date   CATARACT EXTRACTION Bilateral 2017       Family History  Problem Relation Age of Onset   Renal cancer Mother    Hypertension Mother    Pancreatic cancer Mother    Hypertension Sister    Stroke Sister    Leukemia Maternal Uncle    Sickle cell trait Maternal Aunt    Colon cancer Neg Hx    Esophageal cancer Neg Hx    Rectal cancer Neg Hx     Stomach cancer Neg Hx     Social History   Tobacco Use   Smoking status: Never   Smokeless tobacco: Never  Vaping Use   Vaping Use: Never used  Substance Use Topics   Alcohol use: No    Alcohol/week: 0.0 standard drinks   Drug use: No    Home Medications Prior to Admission medications   Medication Sig Start Date End Date Taking? Authorizing Provider  acetaminophen (TYLENOL) 500 MG tablet Take 1,000 mg by mouth in the morning and at bedtime.    [provider]  amLODipine (NORVASC) 10 MG tablet Take 1 tablet (10 mg total) by mouth daily. 07/20/20 10/26/20  Charlott Rakes, MD  atorvastatin (LIPITOR) 40 MG tablet Take 1 tablet (40 mg total) by mouth daily. 07/20/20   Charlott Rakes, MD  Blood Glucose Monitoring Suppl (TRUE METRIX METER) w/Device KIT Use as directed 07/20/20   Ladell Pier, MD  Blood Pressure Monitor DEVI Use as directed to check home blood pressure 2-3 times a week 07/13/20   Camillia Herter, NP  cefdinir (OMNICEF) 300 MG capsule Take 1 capsule by mouth daily 08/26/20     chlorthalidone (HYGROTON) 25 MG tablet Take 0.5 tablets (12.5 mg total) by mouth every other day. 07/20/20   Charlott Rakes, MD  cloNIDine (CATAPRES - DOSED IN MG/24 HR) 0.1 mg/24hr patch Place 1 patch (0.1 mg total) onto the skin once a week. 07/20/20   Charlott Rakes, MD  cyclobenzaprine (FLEXERIL) 5 MG tablet Take 1 tablet (5 mg total) by mouth 2 (two) times daily as needed for muscle spasms (medication can cause drowsiness). 08/19/20   Charlott Rakes, MD  D-Mannose 350 MG CAPS Take 1,050 capsules by mouth in the morning and at bedtime.    [provider]  escitalopram (LEXAPRO) 20 MG tablet Take 1 tablet (20 mg total) by mouth daily. 07/20/20   Charlott Rakes, MD  finasteride (PROSCAR) 5 MG tablet Take 1 tablet (5 mg total) by mouth daily. 07/20/20   Charlott Rakes, MD  furosemide (LASIX) 40 MG tablet Take 1 tablet (40 mg total) by mouth daily as needed for fluid or edema. 08/04/20  09/03/20  Florencia Reasons, MD  glucose blood (TRUE METRIX BLOOD GLUCOSE TEST) test strip Use as instructed 07/20/20   Ladell Pier, MD  hydrALAZINE (APRESOLINE) 100 MG tablet Take 1 tablet (100 mg total) by mouth 3 (three) times daily. 07/20/20   Charlott Rakes, MD  hydrALAZINE (APRESOLINE) 25 MG tablet Take 1 tablet (25 mg total) by mouth 3 (three) times daily. 07/20/20   Charlott Rakes, MD  insulin detemir (LEVEMIR) 100 UNIT/ML injection Inject 0.1 mLs (10 Units total) into the skin daily. 07/20/20   Charlott Rakes, MD  Insulin Syringe-Needle U-100 (TRUEPLUS INSULIN SYRINGE) 31G X 5/16" 0.3 ML MISC Use to inject Levemir at bedtime. 07/20/20   Charlott Rakes, MD  lisinopril (ZESTRIL) 5 MG tablet Take 1 tablet by mouth daily 08/24/20  loperamide (IMODIUM) 2 MG capsule Take 2 mg by mouth See admin instructions. Give 1 tablet by mouth every 2 hours as needed for each loose stool not to exceed 57m in 24 hours.    [provider]  metoprolol succinate (TOPROL-XL) 100 MG 24 hr tablet Take 1.5 tablets (150 mg total) by mouth at bedtime. Take with or immediately following a meal. 07/20/20   NCharlott Rakes MD  Multiple Vitamins-Minerals (MULTIVITAMIN WITH MINERALS) tablet Take 1 tablet by mouth daily.    [provider]  nortriptyline (PAMELOR) 25 MG capsule Take 1 capsule (25 mg total) by mouth at bedtime. 07/20/20   NCharlott Rakes MD  ondansetron (ZOFRAN) 4 MG tablet Take 1 tablet (4 mg total) by mouth every 8 (eight) hours as needed for nausea or vomiting. 07/20/20   NCharlott Rakes MD  pantoprazole (PROTONIX) 40 MG tablet Take 1 tablet (40 mg total) by mouth daily. 07/09/20 08/08/20  PDarliss Cheney MD  tadalafil (CIALIS) 5 MG tablet Take 1 tablet (5 mg total) by mouth daily. 07/20/20   NCharlott Rakes MD  tamsulosin (FLOMAX) 0.4 MG CAPS capsule Take 1 capsule (0.4 mg total) by mouth daily after breakfast. 07/20/20   NCharlott Rakes MD  traMADol (ULTRAM) 50 MG tablet Take 50 mg by mouth 2 (two)  times daily.    [provider]  traZODone (DESYREL) 50 MG tablet Take 3 tablets (150 mg total) by mouth at bedtime. 07/20/20   NCharlott Rakes MD  TRUEplus Lancets 28G MISC Use as directed 07/20/20   JLadell Pier MD    Allergies    Claritin [loratadine], Hydrochlorothiazide, Latex, Lyrica [pregabalin], and Metformin and related  Review of Systems   Review of Systems  Respiratory:  Positive for chest tightness. Negative for shortness of breath.   Cardiovascular:  Positive for chest pain. Negative for palpitations.  Gastrointestinal:  Positive for abdominal pain and nausea.  Genitourinary:  Positive for dysuria.  Musculoskeletal:  Positive for back pain.  All other systems reviewed and are negative.  Physical Exam Updated Vital Signs BP (!) 190/93   Pulse (!) 105   Temp 98 F (36.7 C) (Oral)   Resp 18   Ht 6' 2"  (1.88 m)   Wt 91.6 kg   SpO2 100%   BMI 25.94 kg/m   Physical Exam Vitals and nursing note reviewed.  Constitutional:      Appearance: Normal appearance. He is well-developed. He is not ill-appearing.  HENT:     Head: Normocephalic and atraumatic.  Eyes:     General: No scleral icterus.    Conjunctiva/sclera: Conjunctivae normal.  Cardiovascular:     Rate and Rhythm: Regular rhythm. Tachycardia present.  Pulmonary:     Effort: Pulmonary effort is normal. No respiratory distress.     Breath sounds: Normal breath sounds. No decreased breath sounds or wheezing.  Abdominal:     Palpations: Abdomen is soft.     Tenderness: There is no guarding.  Musculoskeletal:        General: Normal range of motion.     Cervical back: Normal range of motion and neck supple.     Comments: Full range of motion and no tenderness to palpation of bilateral shoulders.  Unable to elicit neuropathic symptoms.  Skin:    General: Skin is warm and dry.     Findings: No rash.  Neurological:     Mental Status: He is alert.     Motor: No weakness.     Comments: Patient with  5 out of 5 strength in the bilateral upper extremities.  Sensation intact.  Normal radial pulses.  Psychiatric:        Mood and Affect: Mood normal.    ED Results / Procedures / Treatments   Labs (all labs ordered are listed, but only abnormal results are displayed) Labs Reviewed  BASIC METABOLIC PANEL - Abnormal; Notable for the following components:      Result Value   Sodium 133 (*)    Glucose, Bld 387 (*)    BUN 35 (*)    Creatinine, Ser 2.94 (*)    GFR, Estimated 24 (*)    All other components within normal limits  CBC - Abnormal; Notable for the following components:   Hemoglobin 12.9 (*)    HCT 38.3 (*)    All other components within normal limits  TROPONIN I (HIGH SENSITIVITY) - Abnormal; Notable for the following components:   Troponin I (High Sensitivity) 47 (*)    All other components within normal limits  TROPONIN I (HIGH SENSITIVITY) - Abnormal; Notable for the following components:   Troponin I (High Sensitivity) 52 (*)    All other components within normal limits  RESP PANEL BY RT-PCR (FLU A&B, COVID) ARPGX2  VITAMIN B12  URINALYSIS, ROUTINE W REFLEX MICROSCOPIC    EKG EKG Interpretation  Date/Time:  Saturday December 10 2020 09:40:59 EST Ventricular Rate:  106 PR Interval:  164 QRS Duration: 101 QT Interval:  358 QTC Calculation: 476 R Axis:   -36 Text Interpretation: Sinus tachycardia LVH with secondary repolarization abnormality Anterior infarct, old Similar to prior tracing Confirmed by Wynona Dove ([id number]) on 12/10/2020 11:07:35 AM  Radiology DG Chest 2 View  Result Date: 12/10/2020 CLINICAL DATA:  Patient reported right sided chest pain that radiates to his right arm that started this morning around 330 am, along with some nausea/vomiting. Pt reported he has neuropathy but this pain is different. Hx of CHF and diabetes. EXAM: CHEST - 2 VIEW COMPARISON:  08/03/2020 FINDINGS: The heart size and mediastinal contours are within normal limits. Both lungs  are clear. No pleural effusion or pneumothorax. The visualized skeletal structures are unremarkable. IMPRESSION: No active cardiopulmonary disease. Electronically Signed   By: Lajean Manes M.D.   On: 12/10/2020 10:18    Procedures Procedures   Medications Ordered in ED Medications  aspirin tablet 325 mg (325 mg Oral Given 12/10/20 1200)  ketorolac (TORADOL) 15 MG/ML injection 15 mg (15 mg Intravenous Given 12/10/20 1027)  ondansetron (ZOFRAN) injection 4 mg (4 mg Intravenous Given 12/10/20 1027)  amLODipine (NORVASC) tablet 10 mg (10 mg Oral Given 12/10/20 1200)  hydrALAZINE (APRESOLINE) tablet 100 mg (100 mg Oral Given 12/10/20 1200)    ED Course  I have reviewed the triage vital signs and the nursing notes.  Pertinent labs & imaging results that were available during my care of the patient were reviewed by me and considered in my medical decision making (see chart for details).    MDM Rules/Calculators/A&P The emergent differential diagnosis of chest pain includes: Acute coronary syndrome, pericarditis, aortic dissection, pulmonary embolism, tension pneumothorax, and esophageal rupture. All were considered throughout eval of the patient.  Fully evaluated by me.  Case discussed with MD agree.  He was in no acute distress.  He has a known history of kidney disease and is on Bactrim for UTI.  Today is the last day of this antibiotic.  I believe both of these explain his elevated troponin.  Second troponin flat. His EKG  was not concerning.  Confirmed by MD Pearline Cables. Heart score 4, but given context of CKD causing decreased clearance of troponin I do not believe he needs to be admitted at this time.  He was originally hypertensive however reported that he had not taken his medications.  Blood pressure came down after hydralazine and amlodipine administration.  Reports that the pain in his shoulder improved after Toradol.  X-ray shows some bone spurring which likely is contributing to his neuropathic  pain down his right arm.  I am sending him a referral to sports medicine for further evaluation and treatment.  I am restarting his gabapentin on a low-dose once a day due to his kidney disease.  He was unable to urinate in the department today however I believe he can follow-up with his primary care or urologist about his UTI if he does not feel the treatment has adequately treated his symptoms. Patient is agreeable to this plan, to be discharged at this time.   Final Clinical Impression(s) / ED Diagnoses Final diagnoses:  AC (acromioclavicular) joint bone spurs, right  Nonspecific chest pain    Rx / DC Orders Results and diagnoses were explained to the patient. Return precautions discussed in full. Patient had no additional questions and expressed complete understanding.    Rhae Hammock, PA-C 12/10/20 1357    Jeanell Sparrow, DO 12/10/20 1557

## 2020-12-12 ENCOUNTER — Other Ambulatory Visit (HOSPITAL_COMMUNITY): Payer: Self-pay

## 2021-01-17 ENCOUNTER — Other Ambulatory Visit (HOSPITAL_COMMUNITY): Payer: Self-pay

## 2021-01-31 ENCOUNTER — Other Ambulatory Visit: Payer: Self-pay

## 2021-01-31 ENCOUNTER — Inpatient Hospital Stay (HOSPITAL_BASED_OUTPATIENT_CLINIC_OR_DEPARTMENT_OTHER)
Admission: EM | Admit: 2021-01-31 | Discharge: 2021-02-08 | DRG: 291 | Disposition: A | Discharge status: Home / Self Care | Payer: Medicaid Other | Attending: Family Medicine | Admitting: Family Medicine

## 2021-01-31 ENCOUNTER — Emergency Department (HOSPITAL_BASED_OUTPATIENT_CLINIC_OR_DEPARTMENT_OTHER): Payer: Medicaid Other | Admitting: Radiology

## 2021-01-31 ENCOUNTER — Encounter (HOSPITAL_BASED_OUTPATIENT_CLINIC_OR_DEPARTMENT_OTHER): Payer: Self-pay | Admitting: Emergency Medicine

## 2021-01-31 DIAGNOSIS — I130 Hypertensive heart and chronic kidney disease with heart failure and stage 1 through stage 4 chronic kidney disease, or unspecified chronic kidney disease: Principal | ICD-10-CM | POA: Diagnosis present

## 2021-01-31 DIAGNOSIS — E1142 Type 2 diabetes mellitus with diabetic polyneuropathy: Secondary | ICD-10-CM | POA: Diagnosis present

## 2021-01-31 DIAGNOSIS — D631 Anemia in chronic kidney disease: Secondary | ICD-10-CM | POA: Diagnosis present

## 2021-01-31 DIAGNOSIS — E785 Hyperlipidemia, unspecified: Secondary | ICD-10-CM | POA: Diagnosis present

## 2021-01-31 DIAGNOSIS — I10 Essential (primary) hypertension: Secondary | ICD-10-CM | POA: Diagnosis present

## 2021-01-31 DIAGNOSIS — J949 Pleural condition, unspecified: Secondary | ICD-10-CM

## 2021-01-31 DIAGNOSIS — I248 Other forms of acute ischemic heart disease: Secondary | ICD-10-CM | POA: Diagnosis present

## 2021-01-31 DIAGNOSIS — Z20822 Contact with and (suspected) exposure to covid-19: Secondary | ICD-10-CM | POA: Diagnosis present

## 2021-01-31 DIAGNOSIS — Z794 Long term (current) use of insulin: Secondary | ICD-10-CM

## 2021-01-31 DIAGNOSIS — R0989 Other specified symptoms and signs involving the circulatory and respiratory systems: Secondary | ICD-10-CM

## 2021-01-31 DIAGNOSIS — J9601 Acute respiratory failure with hypoxia: Secondary | ICD-10-CM | POA: Diagnosis present

## 2021-01-31 DIAGNOSIS — J189 Pneumonia, unspecified organism: Secondary | ICD-10-CM

## 2021-01-31 DIAGNOSIS — D75839 Thrombocytosis, unspecified: Secondary | ICD-10-CM | POA: Diagnosis not present

## 2021-01-31 DIAGNOSIS — E11319 Type 2 diabetes mellitus with unspecified diabetic retinopathy without macular edema: Secondary | ICD-10-CM | POA: Diagnosis present

## 2021-01-31 DIAGNOSIS — N179 Acute kidney failure, unspecified: Secondary | ICD-10-CM | POA: Diagnosis present

## 2021-01-31 DIAGNOSIS — F419 Anxiety disorder, unspecified: Secondary | ICD-10-CM | POA: Diagnosis present

## 2021-01-31 DIAGNOSIS — N184 Chronic kidney disease, stage 4 (severe): Secondary | ICD-10-CM

## 2021-01-31 DIAGNOSIS — E1122 Type 2 diabetes mellitus with diabetic chronic kidney disease: Secondary | ICD-10-CM | POA: Diagnosis present

## 2021-01-31 DIAGNOSIS — I2510 Atherosclerotic heart disease of native coronary artery without angina pectoris: Secondary | ICD-10-CM | POA: Diagnosis present

## 2021-01-31 DIAGNOSIS — K219 Gastro-esophageal reflux disease without esophagitis: Secondary | ICD-10-CM | POA: Diagnosis present

## 2021-01-31 DIAGNOSIS — Z79899 Other long term (current) drug therapy: Secondary | ICD-10-CM

## 2021-01-31 DIAGNOSIS — F32A Depression, unspecified: Secondary | ICD-10-CM | POA: Diagnosis present

## 2021-01-31 DIAGNOSIS — E8809 Other disorders of plasma-protein metabolism, not elsewhere classified: Secondary | ICD-10-CM | POA: Diagnosis present

## 2021-01-31 DIAGNOSIS — J690 Pneumonitis due to inhalation of food and vomit: Secondary | ICD-10-CM | POA: Diagnosis present

## 2021-01-31 DIAGNOSIS — I5033 Acute on chronic diastolic (congestive) heart failure: Secondary | ICD-10-CM | POA: Diagnosis present

## 2021-01-31 DIAGNOSIS — Z8249 Family history of ischemic heart disease and other diseases of the circulatory system: Secondary | ICD-10-CM

## 2021-01-31 DIAGNOSIS — E11649 Type 2 diabetes mellitus with hypoglycemia without coma: Secondary | ICD-10-CM | POA: Diagnosis present

## 2021-01-31 DIAGNOSIS — Z91040 Latex allergy status: Secondary | ICD-10-CM

## 2021-01-31 DIAGNOSIS — I509 Heart failure, unspecified: Secondary | ICD-10-CM

## 2021-01-31 DIAGNOSIS — N400 Enlarged prostate without lower urinary tract symptoms: Secondary | ICD-10-CM | POA: Diagnosis present

## 2021-01-31 DIAGNOSIS — Z888 Allergy status to other drugs, medicaments and biological substances status: Secondary | ICD-10-CM

## 2021-01-31 DIAGNOSIS — E119 Type 2 diabetes mellitus without complications: Secondary | ICD-10-CM | POA: Diagnosis present

## 2021-01-31 DIAGNOSIS — Z09 Encounter for follow-up examination after completed treatment for conditions other than malignant neoplasm: Secondary | ICD-10-CM

## 2021-01-31 HISTORY — DX: Atherosclerotic heart disease of native coronary artery without angina pectoris: I25.10

## 2021-01-31 HISTORY — DX: Chronic diastolic (congestive) heart failure: I50.32

## 2021-01-31 HISTORY — DX: Type 2 diabetes mellitus with unspecified complications: E11.8

## 2021-01-31 HISTORY — DX: Benign prostatic hyperplasia without lower urinary tract symptoms: N40.0

## 2021-01-31 HISTORY — DX: Vitreous hemorrhage, unspecified eye: H43.10

## 2021-01-31 HISTORY — DX: Chronic kidney disease, stage 4 (severe): N18.4

## 2021-01-31 HISTORY — DX: Anemia, unspecified: D64.9

## 2021-01-31 LAB — RESP PANEL BY RT-PCR (FLU A&B, COVID) ARPGX2
Influenza A by PCR: NEGATIVE
Influenza B by PCR: NEGATIVE
SARS Coronavirus 2 by RT PCR: NEGATIVE

## 2021-01-31 LAB — BASIC METABOLIC PANEL
Anion gap: 10 (ref 5–15)
BUN: 40 mg/dL — ABNORMAL HIGH (ref 6–20)
CO2: 25 mmol/L (ref 22–32)
Calcium: 8.9 mg/dL (ref 8.9–10.3)
Chloride: 100 mmol/L (ref 98–111)
Creatinine, Ser: 2.81 mg/dL — ABNORMAL HIGH (ref 0.61–1.24)
GFR, Estimated: 25 mL/min — ABNORMAL LOW (ref >60)
Glucose, Bld: 248 mg/dL — ABNORMAL HIGH (ref 70–99)
Potassium: 4.7 mmol/L (ref 3.5–5.1)
Sodium: 135 mmol/L (ref 135–145)

## 2021-01-31 LAB — CBC
HCT: 30.7 % — ABNORMAL LOW (ref 39.0–52.0)
Hemoglobin: 10.2 g/dL — ABNORMAL LOW (ref 13.0–17.0)
MCH: 30.2 pg (ref 26.0–34.0)
MCHC: 33.2 g/dL (ref 30.0–36.0)
MCV: 90.8 fL (ref 80.0–100.0)
Platelets: 320 10*3/uL (ref 150–400)
RBC: 3.38 MIL/uL — ABNORMAL LOW (ref 4.22–5.81)
RDW: 12.9 % (ref 11.5–15.5)
WBC: 9 10*3/uL (ref 4.0–10.5)
nRBC: 0 % (ref 0.0–0.2)

## 2021-01-31 LAB — BRAIN NATRIURETIC PEPTIDE: B Natriuretic Peptide: 645.8 pg/mL — ABNORMAL HIGH (ref 0.0–100.0)

## 2021-01-31 MED ORDER — FUROSEMIDE 10 MG/ML IJ SOLN
20.0000 mg | Freq: Once | INTRAMUSCULAR | Status: AC
  Administered 2021-01-31: 20 mg via INTRAVENOUS
  Filled 2021-01-31: qty 2

## 2021-01-31 MED ORDER — FUROSEMIDE 10 MG/ML IJ SOLN
40.0000 mg | Freq: Once | INTRAMUSCULAR | Status: AC
  Administered 2021-01-31: 40 mg via INTRAVENOUS
  Filled 2021-01-31: qty 4

## 2021-01-31 NOTE — ED Triage Notes (Signed)
Presents for leg edema, SOB, oliguria since Thursday, was given lasix by PCP but swelling was not helped by this. H/o CHF. RA at home.

## 2021-01-31 NOTE — ED Provider Notes (Signed)
[facility] EMERGENCY DEPT Provider Note   CSN: [id number] Arrival date & time: 01/31/21  1912     History  Chief Complaint  Patient presents with   Leg Swelling    Ryan Jenkins is a 61 y.o. male.  Medical history includes diabetes, CKD stage IV, hypertension, hyperlipidemia, and CHF.  Patient presents emergency department with worsening shortness of breath and bilateral leg swelling over the past 4 to 5 days.  He also has a productive cough that is worsened since then as well.  His doctor prescribed him Lasix at home 40 mg to take, however his symptoms have not improved.  He states that he is not making very much urine right now.  He denies any chest pain, fevers, chills.  HPI     Home Medications Prior to Admission medications   Medication Sig Start Date End Date Taking? Authorizing Provider  acetaminophen (TYLENOL) 500 MG tablet Take 1,000 mg by mouth every 6 (six) hours as needed for mild pain or headache.    [provider]  amLODipine (NORVASC) 10 MG tablet Take 1 tablet (10 mg total) by mouth daily. 07/20/20 01/21/21  Charlott Rakes, MD  atorvastatin (LIPITOR) 40 MG tablet Take 1 tablet (40 mg total) by mouth daily. 07/20/20   Charlott Rakes, MD  Blood Glucose Monitoring Suppl (TRUE METRIX METER) w/Device KIT Use as directed 07/20/20   Ladell Pier, MD  Blood Pressure Monitor DEVI Use as directed to check home blood pressure 2-3 times a week 07/13/20   Camillia Herter, NP  cefdinir (OMNICEF) 300 MG capsule Take 1 capsule by mouth daily Patient not taking: Reported on 12/10/2020 08/26/20     chlorthalidone (HYGROTON) 25 MG tablet Take 0.5 tablets (12.5 mg total) by mouth every other day. Patient not taking: Reported on 12/10/2020 07/20/20   Charlott Rakes, MD  cloNIDine (CATAPRES - DOSED IN MG/24 HR) 0.1 mg/24hr patch Place 1 patch (0.1 mg total) onto the skin once a week. 07/20/20   Charlott Rakes, MD  cyclobenzaprine (FLEXERIL) 5 MG tablet Take 1  tablet (5 mg total) by mouth 2 (two) times daily as needed for muscle spasms (medication can cause drowsiness). Patient not taking: Reported on 12/10/2020 08/19/20   Charlott Rakes, MD  D-Mannose 350 MG CAPS Take 1,050 capsules by mouth in the morning and at bedtime.    [provider]  escitalopram (LEXAPRO) 20 MG tablet Take 1 tablet (20 mg total) by mouth daily. 07/20/20   Charlott Rakes, MD  finasteride (PROSCAR) 5 MG tablet Take 1 tablet (5 mg total) by mouth daily. 07/20/20   Charlott Rakes, MD  furosemide (LASIX) 40 MG tablet Take 1 tablet (40 mg total) by mouth daily as needed for fluid or edema. Patient not taking: Reported on 12/10/2020 08/04/20 12/10/20  Florencia Reasons, MD  gabapentin (NEURONTIN) 100 MG capsule Take 2 capsules (200 mg total) by mouth daily for 21 days. 12/10/20 12/31/20  Redwine, Madison A, PA-C  glucose blood (TRUE METRIX BLOOD GLUCOSE TEST) test strip Use as instructed 07/20/20   Ladell Pier, MD  hydrALAZINE (APRESOLINE) 100 MG tablet Take 1 tablet (100 mg total) by mouth 3 (three) times daily. 07/20/20   Charlott Rakes, MD  hydrALAZINE (APRESOLINE) 25 MG tablet Take 1 tablet (25 mg total) by mouth 3 (three) times daily. Patient not taking: Reported on 12/10/2020 07/20/20   Charlott Rakes, MD  insulin detemir (LEVEMIR) 100 UNIT/ML injection Inject 0.1 mLs (10 Units total) into the skin daily.  Patient taking differently: Inject 40 Units into the skin at bedtime. 07/20/20   Charlott Rakes, MD  Insulin Syringe-Needle U-100 (TRUEPLUS INSULIN SYRINGE) 31G X 5/16" 0.3 ML MISC Use to inject Levemir at bedtime. 07/20/20   Charlott Rakes, MD  lisinopril (ZESTRIL) 10 MG tablet Take 10 mg by mouth daily. 11/21/20   [provider]  lisinopril (ZESTRIL) 5 MG tablet Take 1 tablet by mouth daily Patient not taking: Reported on 12/10/2020 08/24/20     metoprolol succinate (TOPROL-XL) 100 MG 24 hr tablet Take 1.5 tablets (150 mg total) by mouth at bedtime. Take with or  immediately following a meal. 07/20/20   Charlott Rakes, MD  Multiple Vitamins-Minerals (MULTIVITAMIN WITH MINERALS) tablet Take 1 tablet by mouth daily.    [provider]  nortriptyline (PAMELOR) 25 MG capsule Take 1 capsule (25 mg total) by mouth at bedtime. 07/20/20   Charlott Rakes, MD  ondansetron (ZOFRAN) 4 MG tablet Take 1 tablet (4 mg total) by mouth every 8 (eight) hours as needed for nausea or vomiting. Patient not taking: Reported on 12/10/2020 07/20/20   Charlott Rakes, MD  pantoprazole (PROTONIX) 40 MG tablet Take 1 tablet (40 mg total) by mouth daily. 07/09/20 08/08/20  Darliss Cheney, MD  tadalafil (CIALIS) 5 MG tablet Take 1 tablet (5 mg total) by mouth daily. Patient not taking: Reported on 12/10/2020 07/20/20   Charlott Rakes, MD  tamsulosin (FLOMAX) 0.4 MG CAPS capsule Take 1 capsule (0.4 mg total) by mouth daily after breakfast. Patient not taking: Reported on 12/10/2020 07/20/20   Charlott Rakes, MD  traZODone (DESYREL) 50 MG tablet Take 3 tablets (150 mg total) by mouth at bedtime. Patient taking differently: Take 150 mg by mouth at bedtime as needed for sleep. 07/20/20   Charlott Rakes, MD  TRUEplus Lancets 28G MISC Use as directed 07/20/20   Ladell Pier, MD      Allergies    Claritin [loratadine], Hydrochlorothiazide, Latex, Lyrica [pregabalin], and Metformin and related    Review of Systems   Review of Systems  Respiratory:  Positive for cough and shortness of breath.   Cardiovascular:  Positive for leg swelling.  All other systems reviewed and are negative.  Physical Exam Updated Vital Signs BP (!) 155/83    Pulse (!) 109    Temp 99.6 F (37.6 C)    Resp (!) 28    Wt 93.9 kg    SpO2 93%    BMI 26.58 kg/m  Physical Exam Vitals and nursing note reviewed.  Constitutional:      General: He is not in acute distress.    Appearance: Normal appearance. He is not ill-appearing, toxic-appearing or diaphoretic.  HENT:     Head: Normocephalic and atraumatic.      Nose: No nasal deformity.     Mouth/Throat:     Lips: Pink. No lesions.     Mouth: Mucous membranes are moist. No injury, lacerations, oral lesions or angioedema.     Pharynx: Oropharynx is clear. Uvula midline. No pharyngeal swelling, oropharyngeal exudate, posterior oropharyngeal erythema or uvula swelling.  Eyes:     General: Gaze aligned appropriately. No scleral icterus.       Right eye: No discharge.        Left eye: No discharge.     Conjunctiva/sclera: Conjunctivae normal.     Right eye: Right conjunctiva is not injected. No exudate or hemorrhage.    Left eye: Left conjunctiva is not injected. No exudate or hemorrhage. Cardiovascular:  Rate and Rhythm: Regular rhythm. Tachycardia present.     Pulses: Normal pulses.          Radial pulses are 2+ on the right side and 2+ on the left side.       Dorsalis pedis pulses are 2+ on the right side and 2+ on the left side.     Heart sounds: Normal heart sounds, S1 normal and S2 normal. Heart sounds not distant. No murmur heard.   No friction rub. No gallop. No S3 or S4 sounds.  Pulmonary:     Effort: Pulmonary effort is normal. No accessory muscle usage or respiratory distress.     Breath sounds: No stridor. Rales present. No wheezing or rhonchi.  Chest:     Chest wall: No tenderness.  Abdominal:     General: Abdomen is flat. Bowel sounds are normal. There is no distension.     Palpations: Abdomen is soft. There is no mass or pulsatile mass.     Tenderness: There is no abdominal tenderness. There is no guarding or rebound.  Musculoskeletal:     Right lower leg: 3+ Edema present.     Left lower leg: 3+ Edema present.  Skin:    General: Skin is warm and dry.     Coloration: Skin is not jaundiced or pale.     Findings: No bruising, erythema, lesion or rash.  Neurological:     General: No focal deficit present.     Mental Status: He is alert and oriented to person, place, and time.     GCS: GCS eye subscore is 4. GCS verbal  subscore is 5. GCS motor subscore is 6.  Psychiatric:        Mood and Affect: Mood normal.        Behavior: Behavior normal. Behavior is cooperative.    ED Results / Procedures / Treatments   Labs (all labs ordered are listed, but only abnormal results are displayed) Labs Reviewed  BASIC METABOLIC PANEL - Abnormal; Notable for the following components:      Result Value   Glucose, Bld 248 (*)    BUN 40 (*)    Creatinine, Ser 2.81 (*)    GFR, Estimated 25 (*)    All other components within normal limits  CBC - Abnormal; Notable for the following components:   RBC 3.38 (*)    Hemoglobin 10.2 (*)    HCT 30.7 (*)    All other components within normal limits  BRAIN NATRIURETIC PEPTIDE - Abnormal; Notable for the following components:   B Natriuretic Peptide 645.8 (*)    All other components within normal limits  RESP PANEL BY RT-PCR (FLU A&B, COVID) ARPGX2  URINALYSIS, ROUTINE W REFLEX MICROSCOPIC    EKG EKG Interpretation  Date/Time:  Tuesday January 31 2021 19:19:50 EST Ventricular Rate:  108 PR Interval:  148 QRS Duration: 92 QT Interval:  334 QTC Calculation: 447 R Axis:   67 Text Interpretation: Sinus tachycardia Possible Anterior infarct , age undetermined ST & T wave abnormality, consider inferior ischemia Abnormal ECG When compared with ECG of 10-Dec-2020 09:40, PREVIOUS ECG IS PRESENT Confirmed by Regan Lemming ([id number]) on 01/31/2021 8:55:01 PM  Radiology DG Chest 2 View  Result Date: 01/31/2021 CLINICAL DATA:  Short of breath.  Leg edema EXAM: CHEST - 2 VIEW COMPARISON:  12/10/2020 FINDINGS: Heart size normal. Question mild vascular congestion. Mild perihilar airspace disease has developed since the prior study. No pleural effusion. IMPRESSION: Mild perihilar airspace disease and vascular  congestion. This may represent pneumonia or mild fluid overload. Electronically Signed   By: Franchot Gallo M.D.   On: 01/31/2021 19:42    Procedures Procedures  Cardiac monitoring  in ED  Medications Ordered in ED Medications  furosemide (LASIX) injection 40 mg (40 mg Intravenous Given 01/31/21 2108)  furosemide (LASIX) injection 20 mg (20 mg Intravenous Given 01/31/21 2127)    ED Course/ Medical Decision Making/ A&P                           Medical Decision Making Amount and/or Complexity of Data Reviewed External Data Reviewed: labs, radiology, ECG and notes. Labs: ordered. Decision-making details documented in ED Course. Radiology: ordered and independent interpretation performed. Decision-making details documented in ED Course. ECG/medicine tests: ordered and independent interpretation performed. Decision-making details documented in ED Course.  Risk Prescription drug management. Decision regarding hospitalization.   This is a 61 y.o. male with a PMH of diabetes, CKD stage IV, hypertension, hyperlipidemia, and CHF who presents to the ED with worsening cough, shortness of breath, and bilateral leg swelling.   Review of Past Records: Admitted on 08/03/20 for CHF exasperation. He has followed with cards since. [facility] for CKD. Last echo in June of 2022 with normal EF, severe LVH, and grade I diastolic dysfunction.  Patient is tachycardic to 108.  Respiration rate 32.  He is now requiring 3 L of oxygen which is new for him.  He is afebrile. Exam notable for bilateral rales and bilateral lower extremity 3+ pitting edema.  I personally reviewed all laboratory work and imaging. Abnormal results outlined below. Labs are with no leukocytosis.  Anemia stable from baseline.  Glucose 248 with no evidence of anion gap or acidosis.  Creatinine is stable at 2.81.  BNP is 645 which is elevated from baseline.  Chest x-ray reveals pulmonary vascular congestion. EKG with ST.  Plan to give 60 mg IV Lasix.  Will admit to medicine service given new oxygen requirement and heart failure exasperation.  Dr. Cyd Silence to accept patient.  I have seen and evaluated this  patient in conjunction with my attending physician who agrees and has made changes to the plan accordingly.  Portions of this note were generated with [employer]. Dictation errors may occur despite best attempts at proofreading.  Final Clinical Impression(s) / ED Diagnoses Final diagnoses:  Acute on chronic congestive heart failure, unspecified heart failure type [facility])    Rx / DC Orders ED Discharge Orders     None         Adolphus Birchwood, PA-C 01/31/21 2139    Regan Lemming, MD 02/01/21 1145

## 2021-01-31 NOTE — ED Notes (Signed)
Transported to xray 

## 2021-01-31 NOTE — ED Notes (Signed)
Sating 89% in room on RA, 2L O2 via [place] applied, improved to 97%

## 2021-02-01 ENCOUNTER — Other Ambulatory Visit: Payer: Self-pay

## 2021-02-01 ENCOUNTER — Encounter (HOSPITAL_COMMUNITY): Payer: Self-pay | Admitting: Internal Medicine

## 2021-02-01 ENCOUNTER — Inpatient Hospital Stay (HOSPITAL_COMMUNITY): Payer: Medicaid Other

## 2021-02-01 DIAGNOSIS — Z79899 Other long term (current) drug therapy: Secondary | ICD-10-CM | POA: Diagnosis not present

## 2021-02-01 DIAGNOSIS — J9601 Acute respiratory failure with hypoxia: Secondary | ICD-10-CM | POA: Diagnosis present

## 2021-02-01 DIAGNOSIS — F419 Anxiety disorder, unspecified: Secondary | ICD-10-CM | POA: Diagnosis present

## 2021-02-01 DIAGNOSIS — I248 Other forms of acute ischemic heart disease: Secondary | ICD-10-CM | POA: Diagnosis present

## 2021-02-01 DIAGNOSIS — Z20822 Contact with and (suspected) exposure to covid-19: Secondary | ICD-10-CM | POA: Diagnosis present

## 2021-02-01 DIAGNOSIS — F32A Depression, unspecified: Secondary | ICD-10-CM | POA: Diagnosis present

## 2021-02-01 DIAGNOSIS — I509 Heart failure, unspecified: Secondary | ICD-10-CM | POA: Diagnosis present

## 2021-02-01 DIAGNOSIS — E1122 Type 2 diabetes mellitus with diabetic chronic kidney disease: Secondary | ICD-10-CM | POA: Diagnosis present

## 2021-02-01 DIAGNOSIS — N184 Chronic kidney disease, stage 4 (severe): Secondary | ICD-10-CM | POA: Diagnosis present

## 2021-02-01 DIAGNOSIS — E785 Hyperlipidemia, unspecified: Secondary | ICD-10-CM | POA: Diagnosis present

## 2021-02-01 DIAGNOSIS — I5031 Acute diastolic (congestive) heart failure: Secondary | ICD-10-CM

## 2021-02-01 DIAGNOSIS — Z888 Allergy status to other drugs, medicaments and biological substances status: Secondary | ICD-10-CM | POA: Diagnosis not present

## 2021-02-01 DIAGNOSIS — N400 Enlarged prostate without lower urinary tract symptoms: Secondary | ICD-10-CM | POA: Diagnosis present

## 2021-02-01 DIAGNOSIS — J690 Pneumonitis due to inhalation of food and vomit: Secondary | ICD-10-CM | POA: Diagnosis present

## 2021-02-01 DIAGNOSIS — I10 Essential (primary) hypertension: Secondary | ICD-10-CM | POA: Diagnosis not present

## 2021-02-01 DIAGNOSIS — N179 Acute kidney failure, unspecified: Secondary | ICD-10-CM | POA: Diagnosis present

## 2021-02-01 DIAGNOSIS — I2510 Atherosclerotic heart disease of native coronary artery without angina pectoris: Secondary | ICD-10-CM | POA: Diagnosis present

## 2021-02-01 DIAGNOSIS — E8809 Other disorders of plasma-protein metabolism, not elsewhere classified: Secondary | ICD-10-CM | POA: Diagnosis present

## 2021-02-01 DIAGNOSIS — K219 Gastro-esophageal reflux disease without esophagitis: Secondary | ICD-10-CM | POA: Diagnosis present

## 2021-02-01 DIAGNOSIS — D631 Anemia in chronic kidney disease: Secondary | ICD-10-CM | POA: Diagnosis present

## 2021-02-01 DIAGNOSIS — I130 Hypertensive heart and chronic kidney disease with heart failure and stage 1 through stage 4 chronic kidney disease, or unspecified chronic kidney disease: Secondary | ICD-10-CM | POA: Diagnosis present

## 2021-02-01 DIAGNOSIS — E11649 Type 2 diabetes mellitus with hypoglycemia without coma: Secondary | ICD-10-CM | POA: Diagnosis present

## 2021-02-01 DIAGNOSIS — I5033 Acute on chronic diastolic (congestive) heart failure: Secondary | ICD-10-CM | POA: Diagnosis present

## 2021-02-01 DIAGNOSIS — E11319 Type 2 diabetes mellitus with unspecified diabetic retinopathy without macular edema: Secondary | ICD-10-CM | POA: Diagnosis present

## 2021-02-01 DIAGNOSIS — Z91040 Latex allergy status: Secondary | ICD-10-CM | POA: Diagnosis not present

## 2021-02-01 DIAGNOSIS — Z794 Long term (current) use of insulin: Secondary | ICD-10-CM | POA: Diagnosis not present

## 2021-02-01 DIAGNOSIS — I2583 Coronary atherosclerosis due to lipid rich plaque: Secondary | ICD-10-CM | POA: Diagnosis not present

## 2021-02-01 LAB — HEMOGLOBIN A1C
Hgb A1c MFr Bld: 13.4 % — ABNORMAL HIGH (ref 4.8–5.6)
Mean Plasma Glucose: 337.88 mg/dL

## 2021-02-01 LAB — URINALYSIS, ROUTINE W REFLEX MICROSCOPIC
Bilirubin Urine: NEGATIVE
Glucose, UA: NEGATIVE mg/dL
Hgb urine dipstick: NEGATIVE
Ketones, ur: NEGATIVE mg/dL
Leukocytes,Ua: NEGATIVE
Nitrite: NEGATIVE
Protein, ur: 100 mg/dL — AB
Specific Gravity, Urine: 1.011 (ref 1.005–1.030)
pH: 7.5 (ref 5.0–8.0)

## 2021-02-01 LAB — CBC WITH DIFFERENTIAL/PLATELET
Abs Immature Granulocytes: 0.02 10*3/uL (ref 0.00–0.07)
Basophils Absolute: 0 10*3/uL (ref 0.0–0.1)
Basophils Relative: 0 %
Eosinophils Absolute: 0.1 10*3/uL (ref 0.0–0.5)
Eosinophils Relative: 1 %
HCT: 29.2 % — ABNORMAL LOW (ref 39.0–52.0)
Hemoglobin: 9.5 g/dL — ABNORMAL LOW (ref 13.0–17.0)
Immature Granulocytes: 0 %
Lymphocytes Relative: 11 %
Lymphs Abs: 1 10*3/uL (ref 0.7–4.0)
MCH: 30.3 pg (ref 26.0–34.0)
MCHC: 32.5 g/dL (ref 30.0–36.0)
MCV: 93 fL (ref 80.0–100.0)
Monocytes Absolute: 0.9 10*3/uL (ref 0.1–1.0)
Monocytes Relative: 9 %
Neutro Abs: 7.6 10*3/uL (ref 1.7–7.7)
Neutrophils Relative %: 79 %
Platelets: 288 10*3/uL (ref 150–400)
RBC: 3.14 MIL/uL — ABNORMAL LOW (ref 4.22–5.81)
RDW: 13 % (ref 11.5–15.5)
WBC: 9.5 10*3/uL (ref 4.0–10.5)
nRBC: 0 % (ref 0.0–0.2)

## 2021-02-01 LAB — COMPREHENSIVE METABOLIC PANEL
ALT: 28 U/L (ref 0–44)
AST: 33 U/L (ref 15–41)
Albumin: 3.1 g/dL — ABNORMAL LOW (ref 3.5–5.0)
Alkaline Phosphatase: 142 U/L — ABNORMAL HIGH (ref 38–126)
Anion gap: 8 (ref 5–15)
BUN: 43 mg/dL — ABNORMAL HIGH (ref 6–20)
CO2: 24 mmol/L (ref 22–32)
Calcium: 8.2 mg/dL — ABNORMAL LOW (ref 8.9–10.3)
Chloride: 103 mmol/L (ref 98–111)
Creatinine, Ser: 2.8 mg/dL — ABNORMAL HIGH (ref 0.61–1.24)
GFR, Estimated: 25 mL/min — ABNORMAL LOW (ref >60)
Glucose, Bld: 149 mg/dL — ABNORMAL HIGH (ref 70–99)
Potassium: 4.4 mmol/L (ref 3.5–5.1)
Sodium: 135 mmol/L (ref 135–145)
Total Bilirubin: 1.1 mg/dL (ref 0.3–1.2)
Total Protein: 6.6 g/dL (ref 6.5–8.1)

## 2021-02-01 LAB — TROPONIN I (HIGH SENSITIVITY)
Troponin I (High Sensitivity): 68 ng/L — ABNORMAL HIGH (ref <18)
Troponin I (High Sensitivity): 65 ng/L — ABNORMAL HIGH (ref <18)
Troponin I (High Sensitivity): 77 ng/L — ABNORMAL HIGH (ref <18)

## 2021-02-01 LAB — PROCALCITONIN: Procalcitonin: 0.52 ng/mL

## 2021-02-01 LAB — GLUCOSE, CAPILLARY
Glucose-Capillary: 136 mg/dL — ABNORMAL HIGH (ref 70–99)
Glucose-Capillary: 140 mg/dL — ABNORMAL HIGH (ref 70–99)

## 2021-02-01 LAB — BRAIN NATRIURETIC PEPTIDE: B Natriuretic Peptide: 862.4 pg/mL — ABNORMAL HIGH (ref 0.0–100.0)

## 2021-02-01 LAB — D-DIMER, QUANTITATIVE: D-Dimer, Quant: 1.26 ug/mL-FEU — ABNORMAL HIGH (ref 0.00–0.50)

## 2021-02-01 MED ORDER — ASPIRIN 325 MG PO TABS
325.0000 mg | ORAL_TABLET | Freq: Every day | ORAL | Status: DC
  Administered 2021-02-01 – 2021-02-03 (×3): 325 mg via ORAL
  Filled 2021-02-01 (×3): qty 1

## 2021-02-01 MED ORDER — GUAIFENESIN-CODEINE 100-10 MG/5ML PO SOLN
5.0000 mL | Freq: Once | ORAL | Status: AC
  Administered 2021-02-01: 5 mL via ORAL
  Filled 2021-02-01: qty 5

## 2021-02-01 MED ORDER — METOPROLOL SUCCINATE ER 50 MG PO TB24
150.0000 mg | ORAL_TABLET | Freq: Every day | ORAL | Status: DC
  Administered 2021-02-01: 150 mg via ORAL
  Filled 2021-02-01: qty 1

## 2021-02-01 MED ORDER — FUROSEMIDE 10 MG/ML IJ SOLN
40.0000 mg | Freq: Two times a day (BID) | INTRAMUSCULAR | Status: DC
  Administered 2021-02-01 – 2021-02-02 (×2): 40 mg via INTRAVENOUS
  Filled 2021-02-01 (×2): qty 4

## 2021-02-01 MED ORDER — ORAL CARE MOUTH RINSE
15.0000 mL | Freq: Two times a day (BID) | OROMUCOSAL | Status: DC
  Administered 2021-02-01 – 2021-02-08 (×14): 15 mL via OROMUCOSAL

## 2021-02-01 MED ORDER — ESCITALOPRAM OXALATE 20 MG PO TABS
20.0000 mg | ORAL_TABLET | Freq: Every day | ORAL | Status: DC
  Administered 2021-02-01 – 2021-02-03 (×3): 20 mg via ORAL
  Filled 2021-02-01 (×3): qty 1

## 2021-02-01 MED ORDER — ONDANSETRON HCL 4 MG/2ML IJ SOLN
4.0000 mg | Freq: Four times a day (QID) | INTRAMUSCULAR | Status: DC | PRN
  Administered 2021-02-01 – 2021-02-02 (×2): 4 mg via INTRAVENOUS
  Filled 2021-02-01 (×2): qty 2

## 2021-02-01 MED ORDER — INSULIN DETEMIR 100 UNIT/ML ~~LOC~~ SOLN
40.0000 [IU] | Freq: Every day | SUBCUTANEOUS | Status: DC
  Administered 2021-02-01: 40 [IU] via SUBCUTANEOUS
  Filled 2021-02-01: qty 0.4

## 2021-02-01 MED ORDER — LEVALBUTEROL HCL 0.63 MG/3ML IN NEBU: 0.6300 mg | INHALATION_SOLUTION | Freq: Four times a day (QID) | RESPIRATORY_TRACT | Status: DC | PRN

## 2021-02-01 MED ORDER — BENZONATATE 100 MG PO CAPS
100.0000 mg | ORAL_CAPSULE | Freq: Once | ORAL | Status: AC
  Administered 2021-02-01: 100 mg via ORAL
  Filled 2021-02-01: qty 1

## 2021-02-01 MED ORDER — PANTOPRAZOLE SODIUM 40 MG PO TBEC
40.0000 mg | DELAYED_RELEASE_TABLET | Freq: Every day | ORAL | Status: DC
Start: 2021-02-01 — End: 2021-02-08
  Administered 2021-02-01 – 2021-02-08 (×8): 40 mg via ORAL
  Filled 2021-02-01 (×8): qty 1

## 2021-02-01 MED ORDER — ACETAMINOPHEN 500 MG PO TABS
1000.0000 mg | ORAL_TABLET | Freq: Once | ORAL | Status: AC
Start: 2021-02-01 — End: 2021-02-01
  Administered 2021-02-01: 1000 mg via ORAL
  Filled 2021-02-01: qty 2

## 2021-02-01 MED ORDER — ACETAMINOPHEN 325 MG PO TABS
650.0000 mg | ORAL_TABLET | Freq: Four times a day (QID) | ORAL | Status: DC | PRN
  Administered 2021-02-03 – 2021-02-07 (×6): 650 mg via ORAL
  Filled 2021-02-01 (×6): qty 2

## 2021-02-01 MED ORDER — ALBUTEROL SULFATE (2.5 MG/3ML) 0.083% IN NEBU
2.5000 mg | INHALATION_SOLUTION | Freq: Four times a day (QID) | RESPIRATORY_TRACT | Status: DC | PRN
  Administered 2021-02-05: 2.5 mg via RESPIRATORY_TRACT
  Filled 2021-02-01: qty 3

## 2021-02-01 MED ORDER — GUAIFENESIN-DM 100-10 MG/5ML PO SYRP
5.0000 mL | ORAL_SOLUTION | ORAL | Status: DC | PRN
  Administered 2021-02-01: 5 mL via ORAL
  Filled 2021-02-01: qty 10

## 2021-02-01 MED ORDER — HEPARIN SODIUM (PORCINE) 5000 UNIT/ML IJ SOLN
5000.0000 [IU] | Freq: Three times a day (TID) | INTRAMUSCULAR | Status: DC
  Administered 2021-02-01 – 2021-02-03 (×6): 5000 [IU] via SUBCUTANEOUS
  Filled 2021-02-01 (×6): qty 1

## 2021-02-01 MED ORDER — FINASTERIDE 5 MG PO TABS
5.0000 mg | ORAL_TABLET | Freq: Every day | ORAL | Status: DC
  Administered 2021-02-01 – 2021-02-08 (×8): 5 mg via ORAL
  Filled 2021-02-01 (×8): qty 1

## 2021-02-01 MED ORDER — FUROSEMIDE 10 MG/ML IJ SOLN
40.0000 mg | Freq: Once | INTRAMUSCULAR | Status: AC
  Administered 2021-02-01: 40 mg via INTRAVENOUS
  Filled 2021-02-01: qty 4

## 2021-02-01 MED ORDER — HYDRALAZINE HCL 100 MG PO TABS: 100.0000 mg | ORAL_TABLET | Freq: Three times a day (TID) | ORAL | Status: DC

## 2021-02-01 MED ORDER — INSULIN ASPART 100 UNIT/ML IJ SOLN
0.0000 [IU] | Freq: Three times a day (TID) | INTRAMUSCULAR | Status: DC
  Administered 2021-02-01 – 2021-02-04 (×5): 1 [IU] via SUBCUTANEOUS
  Administered 2021-02-05: 2 [IU] via SUBCUTANEOUS
  Administered 2021-02-05 (×2): 1 [IU] via SUBCUTANEOUS
  Administered 2021-02-06: 3 [IU] via SUBCUTANEOUS
  Administered 2021-02-06 (×2): 2 [IU] via SUBCUTANEOUS
  Administered 2021-02-07 – 2021-02-08 (×5): 3 [IU] via SUBCUTANEOUS

## 2021-02-01 MED ORDER — ONDANSETRON HCL 4 MG PO TABS: 4.0000 mg | ORAL_TABLET | Freq: Four times a day (QID) | ORAL | Status: DC | PRN

## 2021-02-01 MED ORDER — HYDRALAZINE HCL 50 MG PO TABS
100.0000 mg | ORAL_TABLET | Freq: Three times a day (TID) | ORAL | Status: DC
  Administered 2021-02-01 – 2021-02-08 (×21): 100 mg via ORAL
  Filled 2021-02-01 (×21): qty 2

## 2021-02-01 MED ORDER — NORTRIPTYLINE HCL 25 MG PO CAPS
25.0000 mg | ORAL_CAPSULE | Freq: Every day | ORAL | Status: DC
  Administered 2021-02-01 – 2021-02-02 (×2): 25 mg via ORAL
  Filled 2021-02-01 (×2): qty 1

## 2021-02-01 MED ORDER — TRAZODONE HCL 50 MG PO TABS: 150.0000 mg | ORAL_TABLET | Freq: Every evening | ORAL | Status: DC | PRN

## 2021-02-01 MED ORDER — ATORVASTATIN CALCIUM 40 MG PO TABS
40.0000 mg | ORAL_TABLET | Freq: Every day | ORAL | Status: DC
Start: 2021-02-01 — End: 2021-02-08
  Administered 2021-02-01 – 2021-02-08 (×8): 40 mg via ORAL
  Filled 2021-02-01 (×8): qty 1

## 2021-02-01 MED ORDER — ALBUTEROL SULFATE (2.5 MG/3ML) 0.083% IN NEBU: 2.5000 mg | INHALATION_SOLUTION | Freq: Four times a day (QID) | RESPIRATORY_TRACT | Status: DC

## 2021-02-01 MED ORDER — ACETAMINOPHEN 650 MG RE SUPP: 650.0000 mg | Freq: Four times a day (QID) | RECTAL | Status: DC | PRN

## 2021-02-01 MED ORDER — AMLODIPINE BESYLATE 10 MG PO TABS
10.0000 mg | ORAL_TABLET | Freq: Every day | ORAL | Status: DC
  Administered 2021-02-01 – 2021-02-04 (×4): 10 mg via ORAL
  Filled 2021-02-01 (×4): qty 1

## 2021-02-01 NOTE — Progress Notes (Signed)
Plan of Care Note for accepted transfer   Patient: Ryan Jenkins MRN: [id number]   [place]: 01/31/2021  Facility requesting transfer: [facility] Requesting Provider: Paulita Cradle PA Reason for transfer: Acute diastolic congestive heart failure Facility course:   61 year old male with past medical history of diastolic congestive heart failure (Echo 06/2020 EF 60-65% with G1DD), Diabetes mellitus type 2, diabetic polyneuropathy, hypertension, chronic kidney disease stage IV (baseline Cr 2.6-2.7) who presented to [facility] with complaints of shortness of breath, leg swelling.  On arrival, patient was found to be slightly hypoxic with oxygen saturations in the upper 80s.  Patient was placed on 3 L of oxygen via nasal cannula.  Patient noted to have evidence of vascular congestion and mild pulmonary edema on chest x-ray.  BNP was found to be 645, slightly up from previous values.  Patient was clinically felt to be suffering from acute congestive heart failure and therefore 60 mg of intravenous Lasix was administered.    Plan of care: The patient is accepted for admission to Telemetry unit, at [facility]..    Author: Vernelle Emerald, MD 02/01/2021  Check [email or web address] for on-call coverage.  Nursing staff, Please call [place] number on Amion as soon as patient's arrival, so appropriate admitting provider can evaluate the pt.

## 2021-02-01 NOTE — H&P (Signed)
History and Physical    DEAVON PODGORSKI GUR:[id number] DOB: 1960/12/15 DOA: 01/31/2021  PCP: Armanda Heritage, NP  Patient coming from: Home  Chief Complaint: swelling legs  HPI: Ryan Jenkins is a 61 y.o. male with medical history significant of CKD4, DM2, HTN, anxiety/depression, BPH. Presenting with BLE swelling. His swelling started 1 week ago. When he first noticed it, he called his PCP. He was given a prescription for lasix. He has taken it as prescribed and not noticed an improvement. He reports that during this time he also developed a dry cough that was unresponsive to cough meds. He also reports that he has had intermittent chest tightness for at least 2 weeks. He was evaluated on 01/27/21 w/ his cardiologist. An echo was performed at that time. When his swelling did not improve yesterday, he decided to come to the ED for assistance. He denies any other aggravating or alleviating factors.    ED Course: His BNP was elevated. CXR showed some pulm vasc congestion. He was started on lasix. TRH was called for admission.   Review of Systems:  Denies palpitations, N/V/D, fevers, sick contacts, abdominal pain. Review of systems is otherwise negative for all not mentioned in HPI.   PMHx Past Medical History:  Diagnosis Date   Abdominal pain 11/25/2018   Acid reflux 11/25/2018   Anxiety    Bilateral swelling of feet and ankles 11/25/2018   and legs   Change in bowel habits 11/25/2018   Constipation 11/25/2018   Depression    Diabetes mellitus without complication ([place])    Diabetic neuropathy ([place])    Diarrhea 11/25/2018   Fatigue 11/25/2018   GERD (gastroesophageal reflux disease)    Hypertension    Loss of appetite 11/25/2018   Muscle spasm 11/25/2018   Neuromuscular disorder ([place])    neuropathy   Retinopathy due to secondary diabetes [facility])    Sleep disturbances 11/25/2018   Vision changes 11/25/2018   Vitamin B12 deficiency     PSHx Past Surgical History:  Procedure  Laterality Date   CATARACT EXTRACTION Bilateral 2017    SocHx  reports that he has never smoked. He has never used smokeless tobacco. He reports that he does not drink alcohol and does not use drugs.  Allergies  Allergen Reactions   Claritin [Loratadine] Swelling    Joint swelling    Hydrochlorothiazide Other (See Comments)    Dizziness   Latex Hives   Lyrica [Pregabalin] Other (See Comments)    depression   Metformin And Related Other (See Comments)    GI    FamHx Family History  Problem Relation Age of Onset   Renal cancer Mother    Hypertension Mother    Pancreatic cancer Mother    Hypertension Sister    Stroke Sister    Leukemia Maternal Uncle    Sickle cell trait Maternal Aunt    Colon cancer Neg Hx    Esophageal cancer Neg Hx    Rectal cancer Neg Hx    Stomach cancer Neg Hx     Prior to Admission medications   Medication Sig Start Date End Date Taking? Authorizing Provider  amLODipine (NORVASC) 10 MG tablet Take 1 tablet (10 mg total) by mouth daily. 07/20/20 02/01/21 Yes Charlott Rakes, MD  atorvastatin (LIPITOR) 40 MG tablet Take 1 tablet (40 mg total) by mouth daily. 07/20/20  Yes Newlin, Charlane Ferretti, MD  D-Mannose 350 MG CAPS Take 1,050 capsules by mouth in the morning and at bedtime.   Yes [provider]  escitalopram (LEXAPRO) 20 MG tablet Take 1 tablet (20 mg total) by mouth daily. 07/20/20  Yes Charlott Rakes, MD  finasteride (PROSCAR) 5 MG tablet Take 1 tablet (5 mg total) by mouth daily. 07/20/20  Yes Charlott Rakes, MD  hydrALAZINE (APRESOLINE) 100 MG tablet Take 1 tablet (100 mg total) by mouth 3 (three) times daily. 07/20/20  Yes Newlin, Charlane Ferretti, MD  insulin detemir (LEVEMIR) 100 UNIT/ML injection Inject 0.1 mLs (10 Units total) into the skin daily. Patient taking differently: Inject 40 Units into the skin at bedtime. 07/20/20  Yes Charlott Rakes, MD  lisinopril (ZESTRIL) 10 MG tablet Take 10 mg by mouth daily. 11/21/20  Yes [provider]   metoprolol succinate (TOPROL-XL) 100 MG 24 hr tablet Take 1.5 tablets (150 mg total) by mouth at bedtime. Take with or immediately following a meal. 07/20/20  Yes Newlin, Enobong, MD  nortriptyline (PAMELOR) 25 MG capsule Take 1 capsule (25 mg total) by mouth at bedtime. 07/20/20  Yes Charlott Rakes, MD  pantoprazole (PROTONIX) 40 MG tablet Take 1 tablet (40 mg total) by mouth daily. 07/09/20 02/01/21 Yes Pahwani, Einar Grad, MD  traZODone (DESYREL) 50 MG tablet Take 3 tablets (150 mg total) by mouth at bedtime. Patient taking differently: Take 150 mg by mouth at bedtime as needed for sleep. 07/20/20  Yes Charlott Rakes, MD  acetaminophen (TYLENOL) 500 MG tablet Take 1,000 mg by mouth every 6 (six) hours as needed for mild pain or headache.    [provider]  Blood Glucose Monitoring Suppl (TRUE METRIX METER) w/Device KIT Use as directed 07/20/20   Ladell Pier, MD  Blood Pressure Monitor DEVI Use as directed to check home blood pressure 2-3 times a week 07/13/20   Camillia Herter, NP  cefdinir (OMNICEF) 300 MG capsule Take 1 capsule by mouth daily Patient not taking: Reported on 12/10/2020 08/26/20     chlorthalidone (HYGROTON) 25 MG tablet Take 0.5 tablets (12.5 mg total) by mouth every other day. Patient not taking: Reported on 12/10/2020 07/20/20   Charlott Rakes, MD  cloNIDine (CATAPRES - DOSED IN MG/24 HR) 0.1 mg/24hr patch Place 1 patch (0.1 mg total) onto the skin once a week. Patient not taking: Reported on 02/01/2021 07/20/20   Charlott Rakes, MD  cyclobenzaprine (FLEXERIL) 5 MG tablet Take 1 tablet (5 mg total) by mouth 2 (two) times daily as needed for muscle spasms (medication can cause drowsiness). Patient not taking: Reported on 12/10/2020 08/19/20   Charlott Rakes, MD  furosemide (LASIX) 40 MG tablet Take 1 tablet (40 mg total) by mouth daily as needed for fluid or edema. Patient not taking: Reported on 12/10/2020 08/04/20 12/10/20  Florencia Reasons, MD  gabapentin (NEURONTIN) 100 MG capsule  Take 2 capsules (200 mg total) by mouth daily for 21 days. 12/10/20 12/31/20  Redwine, Madison A, PA-C  glucose blood (TRUE METRIX BLOOD GLUCOSE TEST) test strip Use as instructed 07/20/20   Ladell Pier, MD  hydrALAZINE (APRESOLINE) 25 MG tablet Take 1 tablet (25 mg total) by mouth 3 (three) times daily. Patient not taking: Reported on 12/10/2020 07/20/20   Charlott Rakes, MD  Insulin Syringe-Needle U-100 (TRUEPLUS INSULIN SYRINGE) 31G X 5/16" 0.3 ML MISC Use to inject Levemir at bedtime. 07/20/20   Charlott Rakes, MD  lisinopril (ZESTRIL) 5 MG tablet Take 1 tablet by mouth daily Patient not taking: Reported on 12/10/2020 08/24/20     ondansetron (ZOFRAN) 4 MG tablet Take 1 tablet (4 mg total) by mouth every  8 (eight) hours as needed for nausea or vomiting. Patient not taking: Reported on 12/10/2020 07/20/20   Charlott Rakes, MD  tadalafil (CIALIS) 5 MG tablet Take 1 tablet (5 mg total) by mouth daily. Patient not taking: Reported on 12/10/2020 07/20/20   Charlott Rakes, MD  tamsulosin (FLOMAX) 0.4 MG CAPS capsule Take 1 capsule (0.4 mg total) by mouth daily after breakfast. Patient not taking: Reported on 12/10/2020 07/20/20   Charlott Rakes, MD  TRUEplus Lancets 28G MISC Use as directed 07/20/20   Ladell Pier, MD    Physical Exam: Vitals:   02/01/21 1046 02/01/21 1200 02/01/21 1316 02/01/21 1346  BP:  (!) 154/77 (!) 148/68   Pulse:  (!) 115 (!) 112   Resp:  (!) 30 20   Temp:   99.3 F (37.4 C)   TempSrc:   Oral   SpO2: 96% 97% 91%   Weight:    102.8 kg  Height:    6' 2"  (1.88 m)    General: 61 y.o. male resting in bed in NAD Eyes: PERRL, normal sclera ENMT: Nares patent w/o discharge, orophaynx clear, dentition normal, ears w/o discharge/lesions/ulcers Neck: Supple, trachea midline Cardiovascular: RRR, +S1, S2, no m/g/r, equal pulses throughout Respiratory: decreased at bases, no w/r/r, slightly increased WOB on 4L [place] GI: BS+, NDNT, no masses noted, no organomegaly  noted MSK: No c/c; BLE edema Neuro: A&O x 3, no focal deficits Psyc: Appropriate interaction and affect, calm/cooperative  Labs on Admission: I have personally reviewed following labs and imaging studies  CBC: Recent Labs  Lab 01/31/21 1925  WBC 9.0  HGB 10.2*  HCT 30.7*  MCV 90.8  PLT 201   Basic Metabolic Panel: Recent Labs  Lab 01/31/21 1925  NA 135  K 4.7  CL 100  CO2 25  GLUCOSE 248*  BUN 40*  CREATININE 2.81*  CALCIUM 8.9   GFR: Estimated Creatinine Clearance: 35.7 mL/min (A) (by C-G formula based on SCr of 2.81 mg/dL (H)). Liver Function Tests: No results for input(s): AST, ALT, ALKPHOS, BILITOT, PROT, ALBUMIN in the last 168 hours. No results for input(s): LIPASE, AMYLASE in the last 168 hours. No results for input(s): AMMONIA in the last 168 hours. Coagulation Profile: No results for input(s): INR, PROTIME in the last 168 hours. Cardiac Enzymes: No results for input(s): CKTOTAL, CKMB, CKMBINDEX, TROPONINI in the last 168 hours. BNP (last 3 results) No results for input(s): PROBNP in the last 8760 hours. HbA1C: No results for input(s): HGBA1C in the last 72 hours. CBG: No results for input(s): GLUCAP in the last 168 hours. Lipid Profile: No results for input(s): CHOL, HDL, LDLCALC, TRIG, CHOLHDL, LDLDIRECT in the last 72 hours. Thyroid Function Tests: No results for input(s): TSH, T4TOTAL, FREET4, T3FREE, THYROIDAB in the last 72 hours. Anemia Panel: No results for input(s): VITAMINB12, FOLATE, FERRITIN, TIBC, IRON, RETICCTPCT in the last 72 hours. Urine analysis:    Component Value Date/Time   COLORURINE YELLOW 01/31/2021 2139   APPEARANCEUR CLEAR 01/31/2021 2139   LABSPEC 1.011 01/31/2021 2139   PHURINE 7.5 01/31/2021 2139   GLUCOSEU NEGATIVE 01/31/2021 2139   HGBUR NEGATIVE 01/31/2021 2139   BILIRUBINUR NEGATIVE 01/31/2021 2139   [place] NEGATIVE 01/31/2021 2139   PROTEINUR 100 (A) 01/31/2021 2139   NITRITE NEGATIVE 01/31/2021 2139    LEUKOCYTESUR NEGATIVE 01/31/2021 2139    Radiological Exams on Admission: DG Chest 2 View  Result Date: 01/31/2021 CLINICAL DATA:  Short of breath.  Leg edema EXAM: CHEST - 2 VIEW COMPARISON:  12/10/2020 FINDINGS: Heart size normal. Question mild vascular congestion. Mild perihilar airspace disease has developed since the prior study. No pleural effusion. IMPRESSION: Mild perihilar airspace disease and vascular congestion. This may represent pneumonia or mild fluid overload. Electronically Signed   By: Franchot Gallo M.D.   On: 01/31/2021 19:42    EKG: Independently reviewed. Sinus, minimal ST elevation in V2   Echo 01/27/21: Left Ventricle: Left ventricle size is normal.There is moderate  concentric hypertrophy.    Left Ventricle: Systolic function is normal. EF: 60-65%. Doppler  parameters consistent with mild diastolic dysfunction and low to normal LA  pressure.    Left Atrium: Left atrium is mildly dilated.    Right Ventricle: Right ventricle is normal.Systolic function is normal.    No major valvular abnormalities.  Assessment/Plan Acute on chronic diastolic HF Chest pain/Elevated trp     - admitted to inpt, progressive     - BNP elevated; had echo at the start of his symptoms on 01/27/21 w/ [facility]. Showed EF 47-15 % w/ distolic dysfxn     - for now continue lasix as 66m IV BID; follow I&O and daily weights     - wean O2 as able     - EKG as above, no current CP, trend troponin for now  CKD4     - baseline Scr is between 2.6 and 2.9; he is at baseline     - watch nephrotoxins  DM2     - A1c, SSI, glucose checks, DM diet, levemir  HTN     - resume home regimen as able, hold lisinopril  HLD     - continue home regimen  Anxiety     - continue home regimen  BPH     - continue home regimen  GERD     - PPI  DVT prophylaxis: lovenox   Code Status: FULL  Family Communication: w/ family at bedside  Consults called: None   Status is: Inpatient  Remains inpatient  appropriate because: severity of illness   TJonnie FinnerDO [facility]  If 7PM-7AM, please contact night-coverage [email or web address]  02/01/2021, 2:37 PM

## 2021-02-01 NOTE — Progress Notes (Signed)
°   02/01/21 1625  Notify: Charge Nurse/RN  Name of Charge Nurse/RN Notified Marissa L RN  Date Charge Nurse/RN Notified 02/01/21  Time Charge Nurse/RN Notified 1625  Notify: Provider  Provider Name/Title Dr. Marylyn Ishihara  Date Provider Notified 02/01/21  Time Provider Notified 1630  Notification Type Page  Notification Reason Change in status  Provider response No new orders  Date of Provider Response 02/01/21  Time of Provider Response [id number]  Notify: Rapid Response  Name of Rapid Response RN Notified Royston Cowper RN  Date Rapid Response Notified 02/01/21  Time Rapid Response Notified 1626

## 2021-02-01 NOTE — ED Provider Notes (Signed)
Pt pending inpatient bed at [facility] at this time.  He was diagnosed with acute CHF exacerbation.  Patient began to report some chest pain this morning while boarding.  EKG and troponin ordered.  EKG reviewed, demonstrates no evidence of acute ischemia.  Troponin is elevated, elevated from his baseline.  He does have some chest pain.  Will give aspirin.  Favor NSTEMI likely secondary to demand ischemia from CHF exacerbation, dyspnea.  BNP was repeated, mildly increased from prior.  Patient has begun to have substantial urine output.  Continue to monitor urine output and may repeat Lasix dose if needed.  .Critical Care Performed by: Jeanell Sparrow, DO Authorized by: Jeanell Sparrow, DO   Critical care provider statement:    Critical care time (minutes):  30   Critical care time was exclusive of:  Separately billable procedures and treating other patients   Critical care was necessary to treat or prevent imminent or life-threatening deterioration of the following conditions:  Cardiac failure   Critical care was time spent personally by me on the following activities:  Development of treatment plan with patient or surrogate, discussions with consultants, evaluation of patient's response to treatment, examination of patient, ordering and review of laboratory studies, ordering and review of radiographic studies, ordering and performing treatments and interventions, pulse oximetry, re-evaluation of patient's condition and review of old charts    Jeanell Sparrow, DO 02/01/21 1132

## 2021-02-01 NOTE — ED Notes (Signed)
Handoff report given to Beverlee Nims RN on 4E at [facility]

## 2021-02-01 NOTE — ED Notes (Signed)
Patient on 7 L [place]. O2 sats 100%. Patient does not wear O2 at home. O2 weaned to 5 L and O2 currently 98%. Will attempt to wean O2 as tolerated by patient. RN aware.

## 2021-02-01 NOTE — Plan of Care (Signed)
Problem: Education: Goal: Knowledge of General Education information will improve Description: Including pain rating scale, medication(s)/side effects and non-pharmacologic comfort measures Outcome: Completed/Met   Problem: Activity: Goal: Risk for activity intolerance will decrease Outcome: Completed/Met   Problem: Nutrition: Goal: Adequate nutrition will be maintained Outcome: Completed/Met   Problem: Pain Managment: Goal: General experience of comfort will improve Outcome: Completed/Met   Problem: Safety: Goal: Ability to remain free from injury will improve Outcome: Completed/Met

## 2021-02-01 NOTE — Progress Notes (Addendum)
° ° °  OVERNIGHT PROGRESS REPORT   Notified by RN for patient desaturating. Patient had coughing and desaturation after with sats into the high 70s low 80s. Sats increased to high 90s on 15 L high flow. Breathing treatment given by RT, chest x-ray completed/pending results. Patient appears in no obvious respiratory distress at this time.  UPDATE: 2122 Hrs  CXR read -   "IMPRESSION: Interval worsening of perihilar airspace disease in the lungs bilaterally, possible edema or infiltrate.     Electronically Signed   By: Brett Fairy M.D.   On: 02/01/2021 21:13 "    Additional Lasix ordered - in process.    Gershon Cull MSNA MSN ACNPC-AG Acute Care Nurse Practitioner [place]

## 2021-02-01 NOTE — Progress Notes (Signed)
Paged J. Olena Heckle, NP at 2000 as pt c/o of worsening SOB. Spo2 85% on 7L. Increased  to 15L, sats between 88-94%. Pt also c/o nausea. IV Zofran given. Provider came to bedside to evaluate patient.

## 2021-02-01 NOTE — ED Notes (Signed)
Handoff report given to [facility] 

## 2021-02-01 NOTE — Progress Notes (Signed)
°   02/01/21 1323  Assess: if the MEWS score is Yellow or Red  Were vital signs taken at a resting state? Yes  Focused Assessment No change from prior assessment  Does the patient meet 2 or more of the SIRS criteria? No  MEWS guidelines implemented *See Row Information* No, previously yellow, continue vital signs every 4 hours  Take Vital Signs  Increase Vital Sign Frequency  Yellow: Q 2hr X 2 then Q 4hr X 2, if remains yellow, continue Q 4hrs  Escalate  MEWS: Escalate Yellow: discuss with charge nurse/RN and consider discussing with provider and RRT  Notify: Charge Nurse/RN  Name of Charge Nurse/RN Notified Marissa L RN  Date Charge Nurse/RN Notified 02/01/21  Time Charge Nurse/RN Notified 1323

## 2021-02-01 NOTE — Progress Notes (Signed)
°   02/01/21 1600  Assess: MEWS Score  Temp 100.3 F (37.9 C)  BP (!) 152/74  Pulse Rate (!) 112  Resp (!) 30  Level of Consciousness Alert  SpO2 94 %  O2 Device Nasal Cannula  O2 Flow Rate (L/min) 6 L/min  Assess: MEWS Score  MEWS Temp 0  MEWS Systolic 0  MEWS Pulse 2  MEWS RR 2  MEWS LOC 0  MEWS Score 4  MEWS Score Color Red  Assess: if the MEWS score is Yellow or Red  Were vital signs taken at a resting state? Yes  Focused Assessment Change from prior assessment (see assessment flowsheet)  Does the patient meet 2 or more of the SIRS criteria? Yes  Does the patient have a confirmed or suspected source of infection? No  MEWS guidelines implemented *See Row Information* No, previously red, continue vital signs every 4 hours  Treat  MEWS Interventions Escalated (See documentation below)  Take Vital Signs  Increase Vital Sign Frequency  Red: Q 1hr X 4 then Q 4hr X 4, if remains red, continue Q 4hrs  Escalate  MEWS: Escalate Red: discuss with charge nurse/RN and provider, consider discussing with RRT  Assess: SIRS CRITERIA  SIRS Temperature  0  SIRS Pulse 1  SIRS Respirations  1  SIRS WBC 0  SIRS Score Sum  2

## 2021-02-02 ENCOUNTER — Inpatient Hospital Stay (HOSPITAL_COMMUNITY): Payer: Medicaid Other

## 2021-02-02 ENCOUNTER — Other Ambulatory Visit (HOSPITAL_COMMUNITY): Payer: Medicaid Other

## 2021-02-02 DIAGNOSIS — I509 Heart failure, unspecified: Secondary | ICD-10-CM

## 2021-02-02 DIAGNOSIS — J9601 Acute respiratory failure with hypoxia: Secondary | ICD-10-CM

## 2021-02-02 DIAGNOSIS — I5033 Acute on chronic diastolic (congestive) heart failure: Secondary | ICD-10-CM | POA: Diagnosis not present

## 2021-02-02 LAB — BLOOD GAS, ARTERIAL
Acid-base deficit: 0.6 mmol/L (ref 0.0–2.0)
Bicarbonate: 23.9 mmol/L (ref 20.0–28.0)
FIO2: 100
O2 Saturation: 95 %
Patient temperature: 98.6
pCO2 arterial: 41.3 mmHg (ref 32.0–48.0)
pH, Arterial: 7.38 (ref 7.350–7.450)
pO2, Arterial: 75.8 mmHg — ABNORMAL LOW (ref 83.0–108.0)

## 2021-02-02 LAB — COMPREHENSIVE METABOLIC PANEL
ALT: 22 U/L (ref 0–44)
AST: 22 U/L (ref 15–41)
Albumin: 2.6 g/dL — ABNORMAL LOW (ref 3.5–5.0)
Alkaline Phosphatase: 115 U/L (ref 38–126)
Anion gap: 9 (ref 5–15)
BUN: 44 mg/dL — ABNORMAL HIGH (ref 6–20)
CO2: 25 mmol/L (ref 22–32)
Calcium: 8.1 mg/dL — ABNORMAL LOW (ref 8.9–10.3)
Chloride: 102 mmol/L (ref 98–111)
Creatinine, Ser: 2.19 mg/dL — ABNORMAL HIGH (ref 0.61–1.24)
GFR, Estimated: 34 mL/min — ABNORMAL LOW (ref >60)
Glucose, Bld: 106 mg/dL — ABNORMAL HIGH (ref 70–99)
Potassium: 4 mmol/L (ref 3.5–5.1)
Sodium: 136 mmol/L (ref 135–145)
Total Bilirubin: 0.4 mg/dL (ref 0.3–1.2)
Total Protein: 5.5 g/dL — ABNORMAL LOW (ref 6.5–8.1)

## 2021-02-02 LAB — CBC
HCT: 26.2 % — ABNORMAL LOW (ref 39.0–52.0)
Hemoglobin: 8.5 g/dL — ABNORMAL LOW (ref 13.0–17.0)
MCH: 30.4 pg (ref 26.0–34.0)
MCHC: 32.4 g/dL (ref 30.0–36.0)
MCV: 93.6 fL (ref 80.0–100.0)
Platelets: 258 10*3/uL (ref 150–400)
RBC: 2.8 MIL/uL — ABNORMAL LOW (ref 4.22–5.81)
RDW: 12.9 % (ref 11.5–15.5)
WBC: 7.7 10*3/uL (ref 4.0–10.5)
nRBC: 0 % (ref 0.0–0.2)

## 2021-02-02 LAB — GLUCOSE, CAPILLARY
Glucose-Capillary: 28 mg/dL — CL (ref 70–99)
Glucose-Capillary: 31 mg/dL — CL (ref 70–99)
Glucose-Capillary: 109 mg/dL — ABNORMAL HIGH (ref 70–99)
Glucose-Capillary: 140 mg/dL — ABNORMAL HIGH (ref 70–99)
Glucose-Capillary: 38 mg/dL — CL (ref 70–99)
Glucose-Capillary: 131 mg/dL — ABNORMAL HIGH (ref 70–99)
Glucose-Capillary: 104 mg/dL — ABNORMAL HIGH (ref 70–99)
Glucose-Capillary: 115 mg/dL — ABNORMAL HIGH (ref 70–99)

## 2021-02-02 LAB — ECHOCARDIOGRAM COMPLETE
AR max vel: 1.92 cm2
AV Area VTI: 2.02 cm2
AV Area mean vel: 1.9 cm2
AV Mean grad: 3 mmHg
AV Peak grad: 5.7 mmHg
Ao pk vel: 1.19 m/s
Area-P 1/2: 4.71 cm2
Calc EF: 55.8 %
Height: 74 in
S' Lateral: 3 cm
Single Plane A2C EF: 56.1 %
Single Plane A4C EF: 57.3 %
Weight: 3516.8 oz

## 2021-02-02 MED ORDER — DEXTROSE 50 % IV SOLN
INTRAVENOUS | Status: AC
  Administered 2021-02-02: 50 mL
  Filled 2021-02-02: qty 50

## 2021-02-02 MED ORDER — DEXTROSE 50 % IV SOLN
25.0000 g | INTRAVENOUS | Status: AC
  Administered 2021-02-02: 25 g via INTRAVENOUS

## 2021-02-02 MED ORDER — CHLORHEXIDINE GLUCONATE CLOTH 2 % EX PADS
6.0000 | MEDICATED_PAD | Freq: Every day | CUTANEOUS | Status: DC
  Administered 2021-02-02 – 2021-02-08 (×7): 6 via TOPICAL

## 2021-02-02 MED ORDER — DEXTROSE 50 % IV SOLN
INTRAVENOUS | Status: AC
  Filled 2021-02-02: qty 50

## 2021-02-02 MED ORDER — DEXTROSE 50 % IV SOLN: 1.0000 | Freq: Once | INTRAVENOUS | Status: AC

## 2021-02-02 MED ORDER — FUROSEMIDE 10 MG/ML IJ SOLN
80.0000 mg | Freq: Two times a day (BID) | INTRAMUSCULAR | Status: DC
  Administered 2021-02-02 – 2021-02-03 (×2): 80 mg via INTRAVENOUS
  Filled 2021-02-02 (×3): qty 8

## 2021-02-02 MED ORDER — METOPROLOL SUCCINATE ER 100 MG PO TB24
100.0000 mg | ORAL_TABLET | Freq: Every day | ORAL | Status: DC
  Administered 2021-02-02: 100 mg via ORAL
  Filled 2021-02-02: qty 1

## 2021-02-02 MED ORDER — FUROSEMIDE 10 MG/ML IJ SOLN
60.0000 mg | Freq: Once | INTRAMUSCULAR | Status: AC
  Administered 2021-02-02: 60 mg via INTRAVENOUS
  Filled 2021-02-02: qty 6

## 2021-02-02 NOTE — Progress Notes (Signed)
Inpatient Diabetes Program Recommendations  AACE/ADA: New Consensus Statement on Inpatient Glycemic Control (2015)  Target Ranges:  Prepandial:   less than 140 mg/dL      Peak postprandial:   less than 180 mg/dL (1-2 hours)      Critically ill patients:  140 - 180 mg/dL   Lab Results  Component Value Date   GLUCAP 140 (H) 02/02/2021   HGBA1C 13.4 (H) 02/01/2021    Review of Glycemic Control  Latest Reference Range & Units 02/01/21 17:00 02/01/21 21:04 02/02/21 04:11 02/02/21 04:25 02/02/21 04:42 02/02/21 07:31 02/02/21 08:02  Glucose-Capillary 70 - 99 mg/dL 136 (H) 140 (H) 31 (LL) 28 (LL) 109 (H) 38 (LL) 140 (H)   Diabetes history: DM 2 Outpatient Diabetes medications: Levemir 40 units qhs Current orders for Inpatient glycemic control:  Novolog 0-9 units tid  Inpatient Diabetes Program Recommendations:    Watch on current regimen. Hypoglycemia with home dose of Levemir 40 units  Spoke with pt over the phone regarding A1c level and glucose control at home (this coordinator working remotely). Pt reports getting a new PCP, no changes last visit and a referral to a specialist recently. The Endocrinologist has not contacted him yet. Encouraged him to follow up with his new PCP to make sure referral went through. Compared to last A1c, his current A1c of 13.4% is higher. Pt does report his trends being mostly in the 250-300 range. Pt reports in the last 2 weeks he has had hypoglycemia twice and has missed his insulin dose once. Pt checks his glucose bid. Encouraged glucose control and to continually checking glucose for his Endocrinology appt. Stressed importance of glucose control. Pt usually sees his doctor every 3 months.  Thanks, Tama Headings RN, MSN, BC-ADM Inpatient Diabetes Coordinator Team Pager [phone] (8a-5p)

## 2021-02-02 NOTE — Progress Notes (Signed)
Hypoglycemic Event  CBG: 31  Treatment: 8 oz juice/soda  Symptoms: Pale and Sweaty  Follow-up CBG: Time:0425 CBG Result: 28  Possible Reasons for Event: Medication regimen:   and Change in activity  Comments/MD notified:[name]    Ryan Jenkins  Hypoglycemic Event  CBG: 28   Treatment: D50 50 mL (25 gm)  Symptoms: Pale, Sweaty, and Shaky  Follow-up CBG: TKPT:4656 CBG Result:109  Possible Reasons for Event: Medication regimen:   and Unknown  Comments/MD notified: [name], NP evaluated pt at bedside. Requested RN notify of follow up blood sugar. Discussed patient's Levemir HS.     Ryan Jenkins

## 2021-02-02 NOTE — Progress Notes (Signed)
° ° °  OVERNIGHT PROGRESS REPORT  Notified by RN for hypoglycemic event. Dextrose and PO orange juice given.  CBG rechecks ongoing.   CBG schedule changed to include 3 AM   Gershon Cull MSNA MSN ACNPC-AG Acute Care Nurse Practitioner [place]

## 2021-02-02 NOTE — Progress Notes (Signed)
Hypoglycemic Event  CBG: 38  Treatment: 8 oz juice/soda and D50 50 mL (25 gm)  Symptoms: None  Follow-up CBG: Time:0730 CBG Result:38  Possible Reasons for Event: Inadequate meal intake  Comments/MD notified:Dr. Randie Heinz, Tsosie Billing

## 2021-02-02 NOTE — Progress Notes (Signed)
° °  Echocardiogram 2D Echocardiogram has been performed.  Ryan Jenkins 02/02/2021, 8:03 AM

## 2021-02-02 NOTE — Progress Notes (Signed)
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  SOB SPO2 85% on 7 L increased to 15 L sats 88-94% and still requiring NRB 15 L Also apparently dropped blood sugar 28  Hospital-Problem based course  NSTEMI Acute hypoxic respiratory failure on admission presumably 2/2 CHF exacerbation Was on 30 L of oxygen this a.m., D escalated oxygen after reducing Lasix-given a total of 100 mg today Strict I/O, standing weight, and continue to diurese Lasix 80 twice daily Drop Toprol XL dose 150-->100 Continue hydralazine 100 3 times daily Follow echo--depending on that may need cardiology input-no underlying cardiologist at this time  holding clonidine--continue amlodipine 10, continue aspirin 325 Profound hypoglycemia likely secondary to taking on a constant dose of Levemir but not eating adequately Patient eats his largest meal in the morning and has experienced AM lows might need a.m. long-acting?-- Consider only sliding scale at this time Sugars are improved after D50, have remained in the 100-1 40 range, eating 100% of meals CKD 4 (prior admission [place] 2021 temporarily on CRRT at that time) Will need outpatient follow-up with nephrology Hold lisinopril 10, chlorthalidone 25 BPH Resume Flomax 0.4, Proscar 5 Anxiety Continue Lexapro 20, Pamelor 25, trazodone 150 Prior vitreous hemorrhage left eye 2021  DVT prophylaxis: Heparin Code Status: Full Family Communication: None present at the  bedside Disposition:  Status is: Inpatient  Remains inpatient appropriate because:   Hypoglycemic hypoxic       Consultants:  None at this time  Procedures: Echocardiogram 1/19 is pending  Antimicrobials: None   Subjective: Overnight events noted patient hypoglycemic hypoxic requiring 30 L-reevaluated later in the day-oxygenation has much improved he is passed over 1 L of urine with increase in Lasix dosing Tells me that he eats his largest meal at the beginning of the day 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 lives at home with family He has no chest pain no fever no chills at this time  Objective: Vitals:   02/02/21 0021 02/02/21 0408 02/02/21 0443 02/02/21 0723  BP: 120/64 127/62  122/75  Pulse: 88 84  78  Resp: (!) 23 18  20   Temp: 98.3 F (36.8 C) 97.8 F (36.6 C)  97.9 F (36.6 C)  TempSrc: Oral Oral  Oral  SpO2: 96% 100%  93%  Weight:   99.7 kg   Height:        Intake/Output Summary (Last 24 hours) at 02/02/2021 0751 Last data filed at 02/02/2021 0500 Gross per 24 hour  Intake 840 ml  Output 1725 ml  Net -885 ml   Filed Weights   01/31/21 1923 02/01/21 1346 02/02/21 0443  Weight: 93.9 kg 102.8 kg 99.7 kg    Examination:  Alert African-American male nodding significant cardiopulmonary distress Mild crackles and rales on initial exam posterolaterally S1-S2 no murmur Sinus tach on monitors Abdomen soft no rebound no guarding Trace lower extremity edema Neurologically intact no focal deficit psych euthymic congruent  Data Reviewed: personally reviewed   CBC    Component Value Date/Time   WBC  7.7 02/02/2021 0459   RBC 2.80 (L) 02/02/2021 0459   HGB 8.5 (L) 02/02/2021 0459   HGB 10.9 (L) 07/13/2020 1208   HCT 26.2 (L) 02/02/2021 0459   HCT 34.6 (L) 07/13/2020 1208   PLT 258 02/02/2021 0459   PLT 395 07/13/2020 1208   MCV 93.6 02/02/2021 0459   MCV 94 07/13/2020 1208   MCH 30.4 02/02/2021 0459   MCHC 32.4 02/02/2021 0459   RDW 12.9  02/02/2021 0459   RDW 15.1 07/13/2020 1208   LYMPHSABS 1.0 02/01/2021 1508   LYMPHSABS 2.6 08/07/2017 1424   MONOABS 0.9 02/01/2021 1508   EOSABS 0.1 02/01/2021 1508   EOSABS 0.2 08/07/2017 1424   BASOSABS 0.0 02/01/2021 1508   BASOSABS 0.0 08/07/2017 1424   CMP Latest Ref Rng & Units 02/02/2021 02/01/2021 01/31/2021  Glucose 70 - 99 mg/dL 106(H) 149(H) 248(H)  BUN 6 - 20 mg/dL 44(H) 43(H) 40(H)  Creatinine 0.61 - 1.24 mg/dL 2.19(H) 2.80(H) 2.81(H)  Sodium 135 - 145 mmol/L 136 135 135  Potassium 3.5 - 5.1 mmol/L 4.0 4.4 4.7  Chloride 98 - 111 mmol/L 102 103 100  CO2 22 - 32 mmol/L 25 24 25   Calcium 8.9 - 10.3 mg/dL 8.1(L) 8.2(L) 8.9  Total Protein 6.5 - 8.1 g/dL 5.5(L) 6.6 -  Total Bilirubin 0.3 - 1.2 mg/dL 0.4 1.1 -  Alkaline Phos 38 - 126 U/L 115 142(H) -  AST 15 - 41 U/L 22 33 -  ALT 0 - 44 U/L 22 28 -     Radiology Studies: DG Chest 2 View  Result Date: 01/31/2021 CLINICAL DATA:  Short of breath.  Leg edema EXAM: CHEST - 2 VIEW COMPARISON:  12/10/2020 FINDINGS: Heart size normal. Question mild vascular congestion. Mild perihilar airspace disease has developed since the prior study. No pleural effusion. IMPRESSION: Mild perihilar airspace disease and vascular congestion. This may represent pneumonia or mild fluid overload. Electronically Signed   By: Franchot Gallo M.D.   On: 01/31/2021 19:42   DG CHEST PORT 1 VIEW  Result Date: 02/01/2021 CLINICAL DATA:  Respiratory compromise. EXAM: PORTABLE CHEST 1 VIEW COMPARISON:  01/31/2021. FINDINGS: The heart size and mediastinal contours are within normal limits. Perihilar airspace disease is noted bilaterally and increased from the prior exam. No effusion or pneumothorax. No acute osseous abnormality. IMPRESSION: Interval worsening of perihilar airspace disease in the lungs bilaterally, possible edema or infiltrate. Electronically Signed   By: Brett Fairy M.D.   On: 02/01/2021 21:13     Scheduled Meds:  amLODipine  10 mg Oral Daily    aspirin  325 mg Oral Daily   atorvastatin  40 mg Oral Daily   Chlorhexidine Gluconate Cloth  6 each Topical Daily   escitalopram  20 mg Oral Daily   finasteride  5 mg Oral Daily   furosemide  80 mg Intravenous BID   heparin  5,000 Units Subcutaneous Q8H   hydrALAZINE  100 mg Oral TID   insulin aspart  0-9 Units Subcutaneous TID WC   mouth rinse  15 mL Mouth Rinse BID   metoprolol succinate  150 mg Oral QHS   nortriptyline  25 mg Oral QHS   pantoprazole  40 mg Oral Daily   Continuous Infusions:   LOS: 1 day   Time spent: [place], MD [facility] To contact the attending provider between 7A-7P or the covering provider during after hours 7P-7A, please log into the web site [email or web address] and access using universal [place] password  for that web site. If you do not have the password, please call the hospital operator.  02/02/2021, 7:51 AM

## 2021-02-02 NOTE — Progress Notes (Signed)
Ryan Loll, NP., patient urine output 525 post lasix. Still requiring nonbreather with 15L [place] (with salter on). Patient reports less SOB. Left lung continues to be very dim.

## 2021-02-02 NOTE — Progress Notes (Signed)
Pt 's repeat blood sugar after an Amp of d 50 IV given per MD orders was 140. MD in the room to round on Patient. New orders placed by MD.

## 2021-02-03 ENCOUNTER — Inpatient Hospital Stay (HOSPITAL_COMMUNITY): Payer: Medicaid Other

## 2021-02-03 ENCOUNTER — Encounter (HOSPITAL_COMMUNITY): Payer: Self-pay | Admitting: Internal Medicine

## 2021-02-03 ENCOUNTER — Encounter (HOSPITAL_COMMUNITY): Payer: Medicaid Other

## 2021-02-03 LAB — CBC WITH DIFFERENTIAL/PLATELET
Abs Immature Granulocytes: 0.02 10*3/uL (ref 0.00–0.07)
Basophils Absolute: 0 10*3/uL (ref 0.0–0.1)
Basophils Relative: 0 %
Eosinophils Absolute: 0.1 10*3/uL (ref 0.0–0.5)
Eosinophils Relative: 1 %
HCT: 25.1 % — ABNORMAL LOW (ref 39.0–52.0)
Hemoglobin: 8 g/dL — ABNORMAL LOW (ref 13.0–17.0)
Immature Granulocytes: 0 %
Lymphocytes Relative: 13 %
Lymphs Abs: 1.1 10*3/uL (ref 0.7–4.0)
MCH: 30.3 pg (ref 26.0–34.0)
MCHC: 31.9 g/dL (ref 30.0–36.0)
MCV: 95.1 fL (ref 80.0–100.0)
Monocytes Absolute: 0.7 10*3/uL (ref 0.1–1.0)
Monocytes Relative: 9 %
Neutro Abs: 6.1 10*3/uL (ref 1.7–7.7)
Neutrophils Relative %: 77 %
Platelets: 280 10*3/uL (ref 150–400)
RBC: 2.64 MIL/uL — ABNORMAL LOW (ref 4.22–5.81)
RDW: 13.1 % (ref 11.5–15.5)
WBC: 8.1 10*3/uL (ref 4.0–10.5)
nRBC: 0 % (ref 0.0–0.2)

## 2021-02-03 LAB — GLUCOSE, CAPILLARY
Glucose-Capillary: 90 mg/dL (ref 70–99)
Glucose-Capillary: 80 mg/dL (ref 70–99)
Glucose-Capillary: 100 mg/dL — ABNORMAL HIGH (ref 70–99)
Glucose-Capillary: 148 mg/dL — ABNORMAL HIGH (ref 70–99)
Glucose-Capillary: 154 mg/dL — ABNORMAL HIGH (ref 70–99)

## 2021-02-03 LAB — TROPONIN I (HIGH SENSITIVITY)
Troponin I (High Sensitivity): 59 ng/L — ABNORMAL HIGH (ref <18)
Troponin I (High Sensitivity): 57 ng/L — ABNORMAL HIGH (ref <18)

## 2021-02-03 LAB — OCCULT BLOOD X 1 CARD TO LAB, STOOL: Fecal Occult Bld: NEGATIVE

## 2021-02-03 LAB — RETICULOCYTES
Immature Retic Fract: 15.3 % (ref 2.3–15.9)
RBC.: 2.66 MIL/uL — ABNORMAL LOW (ref 4.22–5.81)
Retic Count, Absolute: 32.5 10*3/uL (ref 19.0–186.0)
Retic Ct Pct: 1.2 % (ref 0.4–3.1)

## 2021-02-03 LAB — IRON AND TIBC
Iron: 10 ug/dL — ABNORMAL LOW (ref 45–182)
Saturation Ratios: 6 % — ABNORMAL LOW (ref 17.9–39.5)
TIBC: 159 ug/dL — ABNORMAL LOW (ref 250–450)
UIBC: 149 ug/dL

## 2021-02-03 LAB — COMPREHENSIVE METABOLIC PANEL
ALT: 24 U/L (ref 0–44)
AST: 27 U/L (ref 15–41)
Albumin: 2.5 g/dL — ABNORMAL LOW (ref 3.5–5.0)
Alkaline Phosphatase: 136 U/L — ABNORMAL HIGH (ref 38–126)
Anion gap: 8 (ref 5–15)
BUN: 55 mg/dL — ABNORMAL HIGH (ref 6–20)
CO2: 24 mmol/L (ref 22–32)
Calcium: 7.8 mg/dL — ABNORMAL LOW (ref 8.9–10.3)
Chloride: 102 mmol/L (ref 98–111)
Creatinine, Ser: 3.43 mg/dL — ABNORMAL HIGH (ref 0.61–1.24)
GFR, Estimated: 20 mL/min — ABNORMAL LOW (ref >60)
Glucose, Bld: 91 mg/dL (ref 70–99)
Potassium: 4.2 mmol/L (ref 3.5–5.1)
Sodium: 134 mmol/L — ABNORMAL LOW (ref 135–145)
Total Bilirubin: 0.6 mg/dL (ref 0.3–1.2)
Total Protein: 6 g/dL — ABNORMAL LOW (ref 6.5–8.1)

## 2021-02-03 LAB — FERRITIN: Ferritin: 329 ng/mL (ref 24–336)

## 2021-02-03 LAB — MAGNESIUM: Magnesium: 2.1 mg/dL (ref 1.7–2.4)

## 2021-02-03 LAB — FOLATE: Folate: 6.6 ng/mL (ref >5.9)

## 2021-02-03 LAB — VITAMIN B12: Vitamin B-12: 370 pg/mL (ref 180–914)

## 2021-02-03 MED ORDER — ASPIRIN EC 81 MG PO TBEC
81.0000 mg | DELAYED_RELEASE_TABLET | Freq: Every day | ORAL | Status: DC
  Administered 2021-02-03 – 2021-02-08 (×6): 81 mg via ORAL
  Filled 2021-02-03 (×6): qty 1

## 2021-02-03 MED ORDER — HEPARIN (PORCINE) 25000 UT/250ML-% IV SOLN
1650.0000 [IU]/h | INTRAVENOUS | Status: DC
  Administered 2021-02-03: 1650 [IU]/h via INTRAVENOUS
  Filled 2021-02-03: qty 250

## 2021-02-03 MED ORDER — POLYETHYLENE GLYCOL 3350 17 G PO PACK
17.0000 g | PACK | Freq: Every day | ORAL | Status: DC
  Administered 2021-02-03 – 2021-02-08 (×6): 17 g via ORAL
  Filled 2021-02-03 (×6): qty 1

## 2021-02-03 MED ORDER — HEPARIN SODIUM (PORCINE) 5000 UNIT/ML IJ SOLN
5000.0000 [IU] | Freq: Three times a day (TID) | INTRAMUSCULAR | Status: DC
  Administered 2021-02-04 – 2021-02-08 (×13): 5000 [IU] via SUBCUTANEOUS
  Filled 2021-02-03 (×12): qty 1

## 2021-02-03 MED ORDER — METOPROLOL SUCCINATE ER 50 MG PO TB24
50.0000 mg | ORAL_TABLET | Freq: Every day | ORAL | Status: DC
  Administered 2021-02-03 – 2021-02-07 (×5): 50 mg via ORAL
  Filled 2021-02-03 (×5): qty 1

## 2021-02-03 MED ORDER — FUROSEMIDE 10 MG/ML IJ SOLN
40.0000 mg | Freq: Every day | INTRAMUSCULAR | Status: DC
  Administered 2021-02-04: 40 mg via INTRAVENOUS
  Filled 2021-02-03: qty 4

## 2021-02-03 MED ORDER — MELATONIN 3 MG PO TABS
3.0000 mg | ORAL_TABLET | Freq: Every day | ORAL | Status: DC
  Administered 2021-02-03 – 2021-02-07 (×5): 3 mg via ORAL
  Filled 2021-02-03 (×5): qty 1

## 2021-02-03 MED ORDER — HEPARIN BOLUS VIA INFUSION
3000.0000 [IU] | Freq: Once | INTRAVENOUS | Status: AC
  Administered 2021-02-03: 3000 [IU] via INTRAVENOUS
  Filled 2021-02-03: qty 3000

## 2021-02-03 MED ORDER — ESCITALOPRAM OXALATE 10 MG PO TABS
5.0000 mg | ORAL_TABLET | Freq: Every day | ORAL | Status: DC
  Administered 2021-02-04 – 2021-02-08 (×5): 5 mg via ORAL
  Filled 2021-02-03 (×5): qty 1

## 2021-02-03 MED ORDER — TRAMADOL HCL 50 MG PO TABS
50.0000 mg | ORAL_TABLET | Freq: Once | ORAL | Status: AC
  Administered 2021-02-03: 50 mg via ORAL
  Filled 2021-02-03: qty 1

## 2021-02-03 MED ORDER — TECHNETIUM TO 99M ALBUMIN AGGREGATED
4.0000 | Freq: Once | INTRAVENOUS | Status: AC
  Administered 2021-02-03: 4.3 via INTRAVENOUS

## 2021-02-03 NOTE — Progress Notes (Addendum)
Note persistent Hypoxia on review of patient Tells me sob is better but pain in Upper L back witth radiation around the sides of back of chest--seems better with laying down No n/v He has chronic leg pain he states form sciatica, no recent travel no Tobacco, no     On review of Data, feel prudent R/o other causes hypoxia I.e.PE  A/P  Hypoxia DDX CHf but could be PE--doubt infectious--check CXR in am Order Stat VQ scan, Stat LE Duples [name] Start heparin   Can d/c the heparin if VQ is neg  Family sister + brother updated  Verneita Griffes, MD [facility] 5:06 PM

## 2021-02-03 NOTE — Progress Notes (Signed)
Patient is resting well at this time, No need of bipap. Pt is on salter and sleeping.

## 2021-02-03 NOTE — Plan of Care (Signed)
°  Problem: Education: Goal: Ability to demonstrate management of disease process will improve Outcome: Progressing   Problem: Activity: Goal: Capacity to carry out activities will improve Outcome: Progressing   Problem: Coping: Goal: Level of anxiety will decrease Outcome: Progressing   Problem: Skin Integrity: Goal: Risk for impaired skin integrity will decrease Outcome: Progressing

## 2021-02-03 NOTE — Consult Note (Addendum)
Cardiology Consultation:   Patient ID: ANTONY SIAN MRN: [id number]; DOB: 25-Mar-1960  Admit date: 01/31/2021 Date of Consult: 02/03/2021  PCP:  Armanda Heritage, NP   [facility] Providers Cardiologist:  [name], [facility] Click here to update MD or APP on Care Team, Refresh:1}     Patient Profile:   LAVONNE CASS is a 61 y.o. male with a hx of chronic diatolic CHF, CAD, uncontrolled diabetes with polyneuropathy, longstanding HTN, CKD stage IV, poor ambulatory status, GERD, prior vitreous hemorrhage of left eye 2021, BPH who is being seen 02/03/2021 for the evaluation of CHF at the request of Dr. Verlon Au.  History of Present Illness:   Mr. Craney was previously evaluated by [facility]. Per their notes, the patient previously moved from [place] to [place]. He was hospitalized in 10-11/2019 at an outside facility with severe respiratory distress requiring intubation, ARF requiring dialysis, diastolic CHF with EF 09%. During that admission he had elevated troponin of 0.94 and underwent cath with 60% prox RCA; no PCI at that time. Renal duplex that admission was negative for RAS. He was discharged to rehab from which he signed out [place]. He decided to move back to [facility] and during the transition stopped taking all of his medications. He did re-establish care with nephrology, cardiology, and primary care here locally. He was admitted here locally 06/2020 with chest pain presumed due to gastritis and then 03/2669 with a/c diastolic HF exacerbation. Last cardiology OV @ [facility] was 11/2020, felt to be doing well - Farxiga and carvedilol were started. Repeat echo was ordered for surveillance. This was performed 01/27/21 with normal EF, mild diastolic dysfunction.   It is challenging to follow what medication he is actually on / should be on. For example, at discharge this summer he was on [place] and Toprol. This was not reported to [facility] so Wilder Glade was then prescribed and he was started on  carvedilol. His MAR coming into this admission still has Toprol. There are several medications listed as not taking. The patient does not actually recall what medications he is taking.  He presented to [facility] ED 01/31/21 with complaints of bilateral LE edema, episodic paroxysms of chest pain, SOB, oliguria and dry cough for 1 weeks' time. He states he'd called his PCP on Thurs 01/26/21 with these symptoms and was prescribed Lasix, dose unknown. Symptoms did not improve. In ED, initial BP 151/73. On arrival he was hypoxic in the upper 80s% requiring supplemental O2. Initial EKG showed Mild perihilar airspace disease and vascular congestion, query PNA vs CHF. Covid/flu negative. He developed worsening hypoxia requiring transition to NRB with f/u CXR showing worsening CHF/enlarging small bilateral pleural effusions. He required NRB for part of his stay but has since been transitioned back to nasal cannula O2. He has received IV Lasix with variable dosing (92m on 1/17, 413mx 2 on 1/18, 4010m 10m37m80mg30m1/19 then 80mg 11m AM). Admit Cr 2.81 -> rising to 3.43 today, also with elevated procalcitonin of 0.52, elevated d-dimer of 1.26, hypoalbuminemia of 2.5 and anemia of 8.0 (9-12 range since 06/2020), hsTroponin low/flat similar to prior values 68-65-77-59. Home lisinopril, chlorthalidone on hold. He is currently normotensive. Also having issues with leg pains, chest pain, anxiety, and profound hypoglycemia at times requiring medication adjustment. He reports the chest pain has been on and off for the last week, sometimes lasting a brief second as a hard/sharp chest pain, otherwise lasting over an hour, no specific pattern/exacerbating feature but worse  with coughing. He does not presently have chest pain. Still feels SOB at times.  Additional data - Admit stated weight 207, trending 226->219->220 - neg 2.7L thus far   Past Medical History:  Diagnosis Date   Anemia    Anxiety    BPH (benign prostatic  hyperplasia)    CAD (coronary artery disease)    Chronic diastolic CHF (congestive heart failure) (HCC)    Chronic kidney disease, stage IV (severe) (HCC)    Depression    Diabetes mellitus with complication (HCC)    Diabetic neuropathy ([place])    GERD (gastroesophageal reflux disease)    Hypertension    Retinopathy due to secondary diabetes ([place])    Sleep disturbances 11/25/2018   Vision changes 11/25/2018   Vitamin B12 deficiency    Vitreous hemorrhage ([place])     Past Surgical History:  Procedure Laterality Date   CATARACT EXTRACTION Bilateral 2017     Home Medications:  Prior to Admission medications   Medication Sig Start Date End Date Taking? Authorizing Provider  amLODipine (NORVASC) 10 MG tablet Take 1 tablet (10 mg total) by mouth daily. 07/20/20 02/01/21 Yes Charlott Rakes, MD  atorvastatin (LIPITOR) 40 MG tablet Take 1 tablet (40 mg total) by mouth daily. 07/20/20  Yes Newlin, Charlane Ferretti, MD  D-Mann[place] 350 MG CAPS Take 1,050 capsules by mouth in the morning and at bedtime.   Yes [provider]  escitalopram (LEXAPRO) 20 MG tablet Take 1 tablet (20 mg total) by mouth daily. 07/20/20  Yes Charlott Rakes, MD  finasteride (PROSCAR) 5 MG tablet Take 1 tablet (5 mg total) by mouth daily. 07/20/20  Yes Charlott Rakes, MD  hydrALAZINE (APRESOLINE) 100 MG tablet Take 1 tablet (100 mg total) by mouth 3 (three) times daily. 07/20/20  Yes Newlin, Charlane Ferretti, MD  insulin detemir (LEVEMIR) 100 UNIT/ML injection Inject 0.1 mLs (10 Units total) into the skin daily. Patient taking differently: Inject 40 Units into the skin at bedtime. 07/20/20  Yes Charlott Rakes, MD  lisinopril (ZESTRIL) 10 MG tablet Take 10 mg by mouth daily. 11/21/20  Yes [provider]  metoprolol succinate (TOPROL-XL) 100 MG 24 hr tablet Take 1.5 tablets (150 mg total) by mouth at bedtime. Take with or immediately following a meal. 07/20/20  Yes Newlin, Enobong, MD  nortriptyline (PAMELOR) 25 MG capsule Take 1 capsule  (25 mg total) by mouth at bedtime. 07/20/20  Yes Charlott Rakes, MD  pantoprazole (PROTONIX) 40 MG tablet Take 1 tablet (40 mg total) by mouth daily. 07/09/20 02/01/21 Yes Pahwani, Einar Grad, MD  traZODone (DESYREL) 50 MG tablet Take 3 tablets (150 mg total) by mouth at bedtime. Patient taking differently: Take 150 mg by mouth at bedtime as needed for sleep. 07/20/20  Yes Charlott Rakes, MD  acetaminophen (TYLENOL) 500 MG tablet Take 1,000 mg by mouth every 6 (six) hours as needed for mild pain or headache.    [provider]  Blood Glucose Monitoring Suppl (TRUE METRIX METER) w/Device KIT Use as directed 07/20/20   Ladell Pier, MD  Blood Pressure Monitor DEVI Use as directed to check home blood pressure 2-3 times a week 07/13/20   Camillia Herter, NP  cefdinir (OMNICEF) 300 MG capsule Take 1 capsule by mouth daily Patient not taking: Reported on 12/10/2020 08/26/20     chlorthalidone (HYGROTON) 25 MG tablet Take 0.5 tablets (12.5 mg total) by mouth every other day. Patient not taking: Reported on 12/10/2020 07/20/20   Charlott Rakes, MD  cloNIDine (CATAPRES - DOSED  IN MG/24 HR) 0.1 mg/24hr patch Place 1 patch (0.1 mg total) onto the skin once a week. Patient not taking: Reported on 02/01/2021 07/20/20   Charlott Rakes, MD  cyclobenzaprine (FLEXERIL) 5 MG tablet Take 1 tablet (5 mg total) by mouth 2 (two) times daily as needed for muscle spasms (medication can cause drowsiness). Patient not taking: Reported on 12/10/2020 08/19/20   Charlott Rakes, MD  furosemide (LASIX) 40 MG tablet Take 1 tablet (40 mg total) by mouth daily as needed for fluid or edema. Patient not taking: Reported on 12/10/2020 08/04/20 12/10/20  Florencia Reasons, MD  gabapentin (NEURONTIN) 100 MG capsule Take 2 capsules (200 mg total) by mouth daily for 21 days. 12/10/20 12/31/20  Redwine, Madison A, PA-C  glucose blood (TRUE METRIX BLOOD GLUCOSE TEST) test strip Use as instructed 07/20/20   Ladell Pier, MD  hydrALAZINE (APRESOLINE)  25 MG tablet Take 1 tablet (25 mg total) by mouth 3 (three) times daily. Patient not taking: Reported on 12/10/2020 07/20/20   Charlott Rakes, MD  Insulin Syringe-Needle U-100 (TRUEPLUS INSULIN SYRINGE) 31G X 5/16" 0.3 ML MISC Use to inject Levemir at bedtime. 07/20/20   Charlott Rakes, MD  lisinopril (ZESTRIL) 5 MG tablet Take 1 tablet by mouth daily Patient not taking: Reported on 12/10/2020 08/24/20     ondansetron (ZOFRAN) 4 MG tablet Take 1 tablet (4 mg total) by mouth every 8 (eight) hours as needed for nausea or vomiting. Patient not taking: Reported on 12/10/2020 07/20/20   Charlott Rakes, MD  tadalafil (CIALIS) 5 MG tablet Take 1 tablet (5 mg total) by mouth daily. Patient not taking: Reported on 12/10/2020 07/20/20   Charlott Rakes, MD  tamsulosin [facility]) 0.4 MG CAPS capsule Take 1 capsule (0.4 mg total) by mouth daily after breakfast. Patient not taking: Reported on 12/10/2020 07/20/20   Charlott Rakes, MD  TRUEplus Lancets 28G MISC Use as directed 07/20/20   Ladell Pier, MD    Inpatient Medications: Scheduled Meds:  amLODipine  10 mg Oral Daily   aspirin  325 mg Oral Daily   atorvastatin  40 mg Oral Daily   Chlorhexidine Gluconate Cloth  6 each Topical Daily   [START ON 02/04/2021] escitalopram  5 mg Oral Daily   finasteride  5 mg Oral Daily   [START ON 02/04/2021] furosemide  40 mg Intravenous Daily   heparin  5,000 Units Subcutaneous Q8H   hydrALAZINE  100 mg Oral TID   insulin aspart  0-9 Units Subcutaneous TID WC   mouth rinse  15 mL Mouth Rinse BID   melatonin  3 mg Oral QHS   metoprolol succinate  50 mg Oral QHS   pantoprazole  40 mg Oral Daily   polyethylene glycol  17 g Oral Daily   Continuous Infusions:  PRN Meds: acetaminophen **OR** acetaminophen, albuterol, guaiFENesin-dextromethorphan, ondansetron **OR** ondansetron (ZOFRAN) IV  Allergies:    Allergies  Allergen Reactions   Claritin [Loratadine] Swelling    Joint swelling    Hydrochlorothiazide Other  (See Comments)    Dizziness   Latex Hives   Lyrica [Pregabalin] Other (See Comments)    depression   Metformin And Related Other (See Comments)    GI    Social History:   Social History   Socioeconomic History   Marital status: Divorced    Spouse name: Not on file   Number of children: 1   Years of education: 12   Highest education level: Not on file  Occupational History   Occupation:  unemployed    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, Editor, commissioning  Tobacco Use   Smoking status: Never   Smokeless tobacco: Never  Vaping Use   Vaping Use: Never used  Substance and Sexual Activity   Alcohol use: No    Alcohol/week: 0.0 standard drinks   Drug use: No   Sexual activity: Yes  Other Topics Concern   Not on file  Social History Narrative   Not on file   Social Determinants of Health   Financial Resource Strain: Not on file  Food Insecurity: Not on file  Transportation Needs: Not on file  Physical Activity: Not on file  Stress: Not on file  Social Connections: Not on file  Intimate Partner Violence: Not on file    Family History:    Family History  Problem Relation Age of Onset   Renal cancer Mother    Hypertension Mother    Pancreatic cancer Mother    Hypertension Sister    Stroke Sister    Leukemia Maternal Uncle    Sickle cell trait Maternal Aunt    Colon cancer Neg Hx    Esophageal cancer Neg Hx    Rectal cancer Neg Hx    Stomach cancer Neg Hx      ROS:  Please see the history of present illness.   All other ROS reviewed and negative.     Physical Exam/Data:   Vitals:   02/02/21 2039 02/02/21 2300 02/03/21 0455 02/03/21 1233  BP: 128/70 (!) 113/59 114/61 125/66  Pulse: 98 93 81 91  Resp:  18    Temp: 99.1 F (37.3 C) 100 F (37.8 C) 98.9 F (37.2 C) 98.8 F (37.1 C)  TempSrc: Oral Oral Oral Oral  SpO2: (!) 89% (!) 86% (!) 89% (!) 88%  Weight:      Height:        Intake/Output Summary (Last 24 hours) at 02/03/2021 1420 Last data filed at 02/03/2021  1125 Gross per 24 hour  Intake 360 ml  Output 425 ml  Net -65 ml   Last 3 Weights 02/02/2021 02/02/2021 02/01/2021  Weight (lbs) 220 lb 219 lb 12.8 oz 226 lb 9.6 oz  Weight (kg) 99.791 kg 99.701 kg 102.785 kg     Body mass index is 28.25 kg/m.  General: Well developed, well nourished AAM in no acute distress. Head: Normocephalic, atraumatic, sclera non-icteric, no xanthomas, nares are without discharge. Neck: Negative for carotid bruits. JVP not elevated. Lungs: Clear bilaterally to auscultation without wheezes, rales, or rhonchi. Breathing is unlabored. Heart: RRR S1 S2 without murmurs, rubs, or gallops.  Abdomen: Soft, non-tender, non-distended with normoactive bowel sounds. No rebound/guarding. Extremities: No clubbing or cyanosis. No edema. Distal pedal pulses are 2+ and equal bilaterally. Neuro: Alert and oriented X 3. Moves all extremities spontaneously. Psych:  Responds to questions appropriately with a normal affect.    EKG:  The EKG was personally reviewed and demonstrates:  Multiple tracings reviewed Initial tracing sinus tach 108bpm, possible prior anterior infarct, downsloped TWI inferiorly and subtle ST depression V5-V6  NSR 76bpm, left axis deviation, possible prior anterior infarct, nonspecific TW changes I, avL, improved from admit overall   Telemetry:  Telemetry was personally reviewed and demonstrates:   NSR  Relevant CV Studies: 2D echo 02/02/21   1. Left ventricular ejection fraction, by estimation, is 60 to 65%. The  left ventricle has normal function. The left ventricle has no regional  wall motion abnormalities. There is mild left ventricular hypertrophy.  Left ventricular diastolic  parameters  are consistent with Grade II diastolic dysfunction (pseudonormalization).  Elevated left atrial pressure.   2. Right ventricular systolic function is normal. The right ventricular  size is normal.   3. The mitral valve is normal in structure. Trivial mitral valve   regurgitation. No evidence of mitral stenosis.   4. The aortic valve is tricuspid. Aortic valve regurgitation is not  visualized. Aortic valve sclerosis is present, with no evidence of aortic  valve stenosis.   5. The inferior vena cava is normal in size with greater than 50%  respiratory variability, suggesting right atrial pressure of 3 mmHg.   Cath 10/2019  Prox RCA lesion with 60% stenosis.   Coronary angiogram demonstrated a 60% proximal nondominant small right  coronary stenosis which is not responsible for patient's current clinical  picture.   Total contrast used these procedures 35 cc likely will not affect  patient's renal status   Sheath removal, bedrest, hydration, continue medical management  Coronary Findings Diagnostic Dominance: Left  Right Coronary Artery: Prox RCA lesion with 60% stenosis.   Intervention  No interventions have been documented.   Technique - Cardiology  The patient was brought to the cardiovascular procedural area non-sedated, in the fasting state. Blood pressure, EKG, and pulse oximetric monitoring was performed continuously throughout the procedure. No sedation was given. It is estimated the patient lost 21m of blood during the procedure.   The skin of the right groin and right arm was clipped, prepped and draped in the usual sterile manner. (If not otherwise specified, skin prep was bilateral.) Pressure Channel 1 zeroed. The skin at the access site was anesthetized. Using a flashback needle with the Modified Seldinger technique the right radial artery was succesfully accessed in a retrograde fashion over the guidewire, under fluoroscopic guidance using a Ss Kit 641f10cm Flex Straight 0.021in X 45cm. A (Catheter 37f70fl3.5 Crv 100cm Radopq Braid Slct Sup) catheter was inserted. The left coronary artery was selectively engaged and injected. Multiple views of the injected vessel were taken. Catheter removed intact.   A (Catheter 37fr39f5 Crv 100cm  Radopq Braid Slct Sup) catheter was inserted. Catheter removed intact.   A (Catheter 37fr 43f2 Crv 100cm 2 Sh Radopq Braid Sup) catheter was inserted. The right coronary artery was non-selectively injected. The right coronary artery was selectively engaged and injected. Catheter removed intact.   Patient HPI - Cardiology  The patient is a 59 ye30 old male who presents with acute pulmonary edema   Cath/PCI consent: I discussed the risks, benefits, and alternatives of the coronary angiogram/possible percutaneous transluminal coronary angioplasty (PTCA)/possible stent(s). The risks include, but are not limited to: stroke, heart attack, death, arrhythmia, renal failure, bleeding, site infection, arterial pseudoaneurysm formation, arterio-venous fistula formation, and peripheral embolization. I also discussed the possible outcomes including: normal findings, coronary artery disease amenable to percutaneous coronary intervention, and coronary artery disease needing surgical myocardial revascularization. The use of bare metal stents and drug eluting stents was discussed, including the rationale and length of treatment with antiplatelet medications for each type of stent. The patient was informed that additional, unanticipated procedures may be necessary during the procedure, based on my clinical judgement. I discussed the possibility of using a closure device to close the arteriotomy if a femoral artery approach is used. I outlined the risks associated with closure device use, including, but not limited to: bleeding, site infection, arterial pseudoaneurysm formation, arterio-venous fistula formation, arterial injury, and clot formation. I provided the patient with an opportunity  to ask questions. The patient gave informed, written consent. Based on procedural findings, I will make further recommendations.   I discussed the patient's code status and the patient is a full code.  Laboratory Data:  High Sensitivity  Troponin:   Recent Labs  Lab 02/01/21 0916 02/01/21 1508 02/01/21 1644 02/03/21 1222  TROPONINIHS 68* 65* 77* 59*     Chemistry Recent Labs  Lab 02/01/21 1508 02/02/21 0459 02/03/21 0425  NA 135 136 134*  K 4.4 4.0 4.2  CL 103 102 102  CO2 24 25 24   GLUCOSE 149* 106* 91  BUN 43* 44* 55*  CREATININE 2.80* 2.19* 3.43*  CALCIUM 8.2* 8.1* 7.8*  MG  --   --  2.1  GFRNONAA 25* 34* 20*  ANIONGAP 8 9 8     Recent Labs  Lab 02/01/21 1508 02/02/21 0459 02/03/21 0425  PROT 6.6 5.5* 6.0*  ALBUMIN 3.1* 2.6* 2.5*  AST 33 22 27  ALT 28 22 24   ALKPHOS 142* 115 136*  BILITOT 1.1 0.4 0.6   Lipids No results for input(s): CHOL, TRIG, HDL, LABVLDL, LDLCALC, CHOLHDL in the last 168 hours.  Hematology Recent Labs  Lab 02/01/21 1508 02/02/21 0459 02/03/21 0425 02/03/21 0429  WBC 9.5 7.7 8.1  --   RBC 3.14* 2.80* 2.64* 2.66*  HGB 9.5* 8.5* 8.0*  --   HCT 29.2* 26.2* 25.1*  --   MCV 93.0 93.6 95.1  --   MCH 30.3 30.4 30.3  --   MCHC 32.5 32.4 31.9  --   RDW 13.0 12.9 13.1  --   PLT 288 258 280  --    Thyroid No results for input(s): TSH, FREET4 in the last 168 hours.  BNP Recent Labs  Lab 01/31/21 1929 02/01/21 0916  BNP 645.8* 862.4*    DDimer  Recent Labs  Lab 02/01/21 1644  DDIMER 1.26*     Radiology/Studies:  DG Chest 2 View  Result Date: 01/31/2021 CLINICAL DATA:  Short of breath.  Leg edema EXAM: CHEST - 2 VIEW COMPARISON:  12/10/2020 FINDINGS: Heart size normal. Question mild vascular congestion. Mild perihilar airspace disease has developed since the prior study. No pleural effusion. IMPRESSION: Mild perihilar airspace disease and vascular congestion. This may represent pneumonia or mild fluid overload. Electronically Signed   By: Franchot Gallo M.D.   On: 01/31/2021 19:42   DG CHEST PORT 1 VIEW  Result Date: 02/02/2021 CLINICAL DATA:  Acute respiratory failure with hypoxia. EXAM: PORTABLE CHEST 1 VIEW COMPARISON:  02/01/2021 FINDINGS: Borderline  cardiomegaly. Interstitial and alveolar pulmonary edema with perihilar manifestation, right more than left. Small bilateral pleural effusions. IMPRESSION: Congestive heart failure pattern, worsened over the last several days. Perihilar edema with enlarging effusions. Electronically Signed   By: Nelson Chimes M.D.   On: 02/02/2021 08:26   DG CHEST PORT 1 VIEW  Result Date: 02/01/2021 CLINICAL DATA:  Respiratory compromise. EXAM: PORTABLE CHEST 1 VIEW COMPARISON:  01/31/2021. FINDINGS: The heart size and mediastinal contours are within normal limits. Perihilar airspace disease is noted bilaterally and increased from the prior exam. No effusion or pneumothorax. No acute osseous abnormality. IMPRESSION: Interval worsening of perihilar airspace disease in the lungs bilaterally, possible edema or infiltrate. Electronically Signed   By: Brett Fairy M.D.   On: 02/01/2021 21:13   ECHOCARDIOGRAM COMPLETE  Result Date: 02/02/2021    ECHOCARDIOGRAM REPORT   Patient Name:   JAYVIER BURGHER Date of Exam: 02/02/2021 Medical Rec #:  [id number]  Height:       74.0 in Accession #:    [id number]        Weight:       219.8 lb Date of Birth:  April 03, 1960         BSA:          2.262 m Patient Age:    60 years          BP:           127/62 mmHg Patient Gender: M                 HR:           75 bpm. Exam Location:  Inpatient Procedure: 2D Echo, Color Doppler and Cardiac Doppler Indications:    ACUTE CHF  History:        Patient has prior history of Echocardiogram examinations, most                 recent 07/07/2020. Risk Factors:Diabetes and Hypertension. GERD.  Sonographer:    Beryle Beams Referring Phys: [id number] [place]  1. Left ventricular ejection fraction, by estimation, is 60 to 65%. The left ventricle has normal function. The left ventricle has no regional wall motion abnormalities. There is mild left ventricular hypertrophy. Left ventricular diastolic parameters are consistent with Grade II  diastolic dysfunction (pseudonormalization). Elevated left atrial pressure.  2. Right ventricular systolic function is normal. The right ventricular size is normal.  3. The mitral valve is normal in structure. Trivial mitral valve regurgitation. No evidence of mitral stenosis.  4. The aortic valve is tricuspid. Aortic valve regurgitation is not visualized. Aortic valve sclerosis is present, with no evidence of aortic valve stenosis.  5. The inferior vena cava is normal in size with greater than 50% respiratory variability, suggesting right atrial pressure of 3 mmHg. FINDINGS  Left Ventricle: Left ventricular ejection fraction, by estimation, is 60 to 65%. The left ventricle has normal function. The left ventricle has no regional wall motion abnormalities. The left ventricular internal cavity size was normal in size. There is  mild left ventricular hypertrophy. Left ventricular diastolic parameters are consistent with Grade II diastolic dysfunction (pseudonormalization). Elevated left atrial pressure. Right Ventricle: The right ventricular size is normal. Right ventricular systolic function is normal. Left Atrium: Left atrial size was normal in size. Right Atrium: Right atrial size was normal in size. Pericardium: Trivial pericardial effusion is present. Mitral Valve: The mitral valve is normal in structure. Trivial mitral valve regurgitation. No evidence of mitral valve stenosis. Tricuspid Valve: The tricuspid valve is normal in structure. Tricuspid valve regurgitation is not demonstrated. No evidence of tricuspid stenosis. Aortic Valve: The aortic valve is tricuspid. Aortic valve regurgitation is not visualized. Aortic valve sclerosis is present, with no evidence of aortic valve stenosis. Aortic valve mean gradient measures 3.0 mmHg. Aortic valve peak gradient measures 5.7  mmHg. Aortic valve area, by VTI measures 2.02 cm. Pulmonic Valve: The pulmonic valve was normal in structure. Pulmonic valve regurgitation is  not visualized. No evidence of pulmonic stenosis. Aorta: The aortic root is normal in size and structure. Venous: The inferior vena cava is normal in size with greater than 50% respiratory variability, suggesting right atrial pressure of 3 mmHg. IAS/Shunts: No atrial level shunt detected by color flow Doppler.  LEFT VENTRICLE PLAX 2D LVIDd:         4.10 cm     Diastology LVIDs:         3.00  cm     LV e' medial:    6.64 cm/s LV PW:         1.30 cm     LV E/e' medial:  18.4 LV IVS:        1.20 cm     LV e' lateral:   9.03 cm/s LVOT diam:     2.00 cm     LV E/e' lateral: 13.5 LV SV:         48 LV SV Index:   21 LVOT Area:     3.14 cm  LV Volumes (MOD) LV vol d, MOD A2C: 80.1 ml LV vol d, MOD A4C: 89.6 ml LV vol s, MOD A2C: 35.2 ml LV vol s, MOD A4C: 38.3 ml LV SV MOD A2C:     44.9 ml LV SV MOD A4C:     89.6 ml LV SV MOD BP:      47.3 ml RIGHT VENTRICLE RV S prime:     10.50 cm/s TAPSE (M-mode): 2.0 cm LEFT ATRIUM             Index        RIGHT ATRIUM           Index LA diam:        4.60 cm 2.03 cm/m   RA Area:     10.50 cm LA Vol (A2C):   69.8 ml 30.85 ml/m  RA Volume:   19.70 ml  8.71 ml/m LA Vol (A4C):   60.1 ml 26.56 ml/m LA Biplane Vol: 67.5 ml 29.83 ml/m  AORTIC VALVE                    PULMONIC VALVE AV Area (Vmax):    1.92 cm     PV Vmax:       0.52 m/s AV Area (Vmean):   1.90 cm     PV Vmean:      35.100 cm/s AV Area (VTI):     2.02 cm     PV VTI:        0.109 m AV Vmax:           119.00 cm/s  PV Peak grad:  1.1 mmHg AV Vmean:          80.100 cm/s  PV Mean grad:  1.0 mmHg AV VTI:            0.236 m AV Peak Grad:      5.7 mmHg AV Mean Grad:      3.0 mmHg LVOT Vmax:         72.80 cm/s LVOT Vmean:        48.500 cm/s LVOT VTI:          0.152 m LVOT/AV VTI ratio: 0.64  AORTA Ao Root diam: 2.80 cm Ao Asc diam:  3.10 cm MITRAL VALVE MV Area (PHT): 4.71 cm     SHUNTS MV Decel Time: 161 msec     Systemic VTI:  0.15 m MV E velocity: 122.00 cm/s  Systemic Diam: 2.00 cm MV A velocity: 48.00 cm/s MV E/A ratio:   2.54 Kirk Ruths MD Electronically signed by Kirk Ruths MD Signature Date/Time: 02/02/2021/11:27:31 AM    Final      Assessment and Plan:   1. Acute hypoxic respiratory failure, acute on chronic diastolic CHF  - complex clinical situation given his recurrent HF as well as advanced renal failure - weights inconsistent, 207, 226, 219, 220, only -2.5L thus far with marginal UOP  recorded (525 yesterday, 300 today) - remains hypoxic requiring supplemental O2 - has confounding issues with elevated d-dimer, mild-moderately elevated procalcitonin so may need workup for additional contributing conditions - BP stable - no further edema on exam - will review further rx with MD - at discharge will need concrete plan for follow-up as the patient is at risk for disjointed continuity of care/medication confusion due to receiving care at multiple institutions and he is not particularly engaged with his medication regimen at home  2. AKI superimposed on CKD stage IV - may be multifactorial in setting of known renal disease, +/- cardiorenal state, diuretic use - diuretic dose held this PM and written to resume at lower dose in AM - previously required RRT during similar admission in [place] so recommend consideration of nephrology consult again this admission - holding lisinopril, chlorthalidone  3. CAD (60% RCA 2021), episodic chest pain, low/flat chronic troponemia, mixed features - initial EKG concerning for ischemia, but follow-up tracings improved and troponin remains relatively flat - consider reducing ASA to 43m daily unless on higher dose for other medical reasons - anticipate continued medical therapy given acute AKI, but may need to consider repeat catheterization if BP persists (? consider Imdur)  4. HTN - BP presently controlled on amlodipine 154mdaily, hydralazine 10080mID, Toprol 54m34mS, also previously intermittently receiving Lasix  Additional issues per primary team: - acute  on chronic anemia - uncontrolled DM with labile hypoglycemia, A1C 13.4 - hypoalbuminemia, hypoclacemia - leg pain  Risk Assessment/Risk Scores:     HEAR Score (for undifferentiated chest pain):  HEAR Score: 4  New York Heart Association (NYHA) Functional Class NYHA Class III   For questions or updates, please contact [facility] Please consult [email or web address] for contact info under    Signed, DaynCharlie Pitter-C  02/03/2021 2:20 PM  Agree with findings by DaynMelina CopaC  Asked to see this 60 y2r old African-American male with history of diabetes, hypertension and diastolic heart failure.  He has chronic renal sufficiency with a serum creatinine that began at 2.8 and trended to 3.5.  He is volume overloaded.  2D echo revealed normal LV systolic function with grade 2 diastolic dysfunction and without valvular abnormalities.  He did have a recent cath that showed a lesion in a nondominant right.  He admits to dietary indiscretion with regards to salt and medication noncompliance.  He has gotten escalated doses of furosemide with minimal diuresis.  He is on multiple antihypertensive hypertensive medications.  I believe he would benefit from a short nutrition consult to talk about dietary modifications at home.  I would pursue a renal consult as well.  I would hold his diuresis for now since he has a progressively elevated serum creatinine and restart at a lower dose once his creatinine begins to decline.  There is no good long-term solution but at least this will temporize.  JonaLorretta HarpD., FACPRoyaltonCCNorthwestern Memorial HospitalHALaverta BaltimoreABuckhorn[address]  [phone]/2023 3:45 PM

## 2021-02-03 NOTE — Progress Notes (Signed)
ANTICOAGULATION CONSULT NOTE   Pharmacy Consult for heparin  Indication: pulmonary embolus - presumed  Allergies  Allergen Reactions   Claritin [Loratadine] Swelling    Joint swelling    Hydrochlorothiazide Other (See Comments)    Dizziness   Latex Hives   Lyrica [Pregabalin] Other (See Comments)    depression   Metformin And Related Other (See Comments)    GI    Patient Measurements: Height: 6' 2"  (188 cm) Weight: 99.8 kg (220 lb) IBW/kg (Calculated) : 82.2 Heparin Dosing Weight: 99.8  Vital Signs: Temp: 98.8 F (37.1 C) (01/20 1233) Temp Source: Oral (01/20 1233) BP: 125/66 (01/20 1233) Pulse Rate: 91 (01/20 1233)  Labs: Recent Labs    02/01/21 1508 02/01/21 1644 02/02/21 0459 02/03/21 0425 02/03/21 1222 02/03/21 1355  HGB 9.5*  --  8.5* 8.0*  --   --   HCT 29.2*  --  26.2* 25.1*  --   --   PLT 288  --  258 280  --   --   CREATININE 2.80*  --  2.19* 3.43*  --   --   TROPONINIHS 65* 77*  --   --  59* 57*     Estimated Creatinine Clearance: 28.9 mL/min (A) (by C-G formula based on SCr of 3.43 mg/dL (H)).   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 30 tablet 2    atorvastatin (LIPITOR) 40 MG tablet Take 1 tablet (40 mg total) by mouth daily. 30 tablet 2 01/31/2021    D-Mannose 350 MG CAPS Take 1,050 capsules by mouth in the morning and at bedtime.   01/31/2021   escitalopram (LEXAPRO) 20 MG tablet Take 1 tablet (20 mg total) by mouth daily. 30 tablet 3 01/31/2021   finasteride (PROSCAR) 5 MG tablet Take 1 tablet (5 mg total) by mouth daily. 30 tablet 2 01/31/2021   hydrALAZINE (APRESOLINE) 100 MG tablet Take 1 tablet (100 mg total) by mouth 3 (three) times daily. 90 tablet 2 01/31/2021   insulin detemir (LEVEMIR) 100 UNIT/ML injection Inject 0.1 mLs (10 Units total) into the skin daily. (Patient taking differently: Inject 40 Units into the skin at bedtime.) 10 mL 2 01/31/2021   lisinopril (ZESTRIL) 10 MG tablet Take 10 mg by mouth daily.   01/31/2021   metoprolol succinate (TOPROL-XL) 100 MG 24 hr tablet Take 1.5 tablets (150 mg total) by mouth at bedtime. Take with or immediately following a meal. 45 tablet 2 01/31/2021   nortriptyline (PAMELOR) 25 MG capsule Take 1 capsule (25 mg total) by mouth at bedtime. 30 capsule 2 01/31/2021   pantoprazole (PROTONIX) 40 MG tablet Take 1 tablet (40 mg total) by mouth daily. 30 tablet 0    traZODone (DESYREL) 50  MG tablet Take 3 tablets (150 mg total) by mouth at bedtime. (Patient taking differently: Take 150 mg by mouth at bedtime as needed for sleep.) 90 tablet 2 01/31/2021   acetaminophen (TYLENOL) 500 MG tablet Take 1,000 mg by mouth every 6 (six) hours as needed for mild pain or headache.      Blood Glucose Monitoring Suppl (TRUE METRIX METER) w/Device KIT Use as directed 1 kit 0    Blood Pressure Monitor DEVI Use as directed to check home blood pressure 2-3 times a week 1 each 0    cefdinir (OMNICEF) 300 MG capsule Take 1 capsule by mouth daily (Patient not taking: Reported on 12/10/2020) 5 capsule 0    chlorthalidone (HYGROTON) 25 MG tablet Take 0.5 tablets (12.5 mg total) by mouth every other day. (Patient not taking: Reported on 12/10/2020) 30 tablet 1    cloNIDine (CATAPRES - DOSED IN MG/24 HR) 0.1 mg/24hr patch Place 1  patch (0.1 mg total) onto the skin once a week. (Patient not taking: Reported on 02/01/2021) 4 patch 2 Completed Course   cyclobenzaprine (FLEXERIL) 5 MG tablet Take 1 tablet (5 mg total) by mouth 2 (two) times daily as needed for muscle spasms (medication can cause drowsiness). (Patient not taking: Reported on 12/10/2020) 15 tablet 0    furosemide (LASIX) 40 MG tablet Take 1 tablet (40 mg total) by mouth daily as needed for fluid or edema. (Patient not taking: Reported on 12/10/2020) 30 tablet 0    gabapentin (NEURONTIN) 100 MG capsule Take 2 capsules (200 mg total) by mouth daily for 21 days. 42 capsule 0    glucose blood (TRUE METRIX BLOOD GLUCOSE TEST) test strip Use as instructed 100 each 12    hydrALAZINE (APRESOLINE) 25 MG tablet Take 1 tablet (25 mg total) by mouth 3 (three) times daily. (Patient not taking: Reported on 12/10/2020) 90 tablet 2    Insulin Syringe-Needle U-100 (TRUEPLUS INSULIN SYRINGE) 31G X 5/16" 0.3 ML MISC Use to inject Levemir at bedtime. 100 each 0    lisinopril (ZESTRIL) 5 MG tablet Take 1 tablet by mouth daily (Patient not taking: Reported on 12/10/2020) 30 tablet 6    ondansetron (ZOFRAN) 4 MG tablet Take 1 tablet (4 mg total) by mouth every 8 (eight) hours as needed for nausea or vomiting. (Patient not taking: Reported on 12/10/2020) 20 tablet 0    tadalafil (CIALIS) 5 MG tablet Take 1 tablet (5 mg total) by mouth daily. (Patient not taking: Reported on 12/10/2020) 30 tablet 2    tamsulosin (FLOMAX) 0.4 MG CAPS capsule Take 1 capsule (0.4 mg total) by mouth daily after breakfast. (Patient not taking: Reported on 12/10/2020) 30 capsule 2    TRUEplus Lancets 28G MISC Use as directed 100 each 4     Assessment: Pharmacy consulted to dose heparin for r/o PE in 61 yo M with persistent hypoxia. No anticoagulants PTA.  PMH: Prior vitreous hemorrhage left eye 08/04/2019 HG 10.2 on admit> 8.0 today PLT 280 WNL SCr 3.43  02/03/2021 VQ negative for PE  No bleeding  reported Goal of Therapy:  Heparin level 0.3-0.7 units/ml Monitor platelets by anticoagulation protocol: Yes   Plan:  VQ negative for PE>> DC heparin drip Resume SQH for VTE px  Eudelia Bunch, Pharm.D 02/03/2021 7:41 PM

## 2021-02-03 NOTE — Progress Notes (Signed)
PROGRESS NOTE   Ryan Jenkins  RDE:[id number] DOB: 1960-11-13 DOA: 01/31/2021 PCP: Armanda Heritage, NP  Brief Narrative:  61 year old African-American male DM ty ii + Neuropathy htn ckd iv (baseline creatinine 2.5) Prior admission [place] 2021,-->CRRT CPPD BPH Anxiety Prior vitreous hemorrhage left eye 08/04/2019  [facility] 01/31/2021 SOB DOE bilateral LL edema oliguria Work-up = BNP 645-Rx 60 Lasix at [facility]- found to have NSTEMI 2/2 demand ischemia CXR = pulmonary vascular congestion  Overnight 1/18 developed ?  SOB SPO2 85% on 7 L increased to 15 L sats 88-94% and still requiring NRB 15 L Also apparently dropped blood sugar 28  Hospital-Problem based course  NSTEMI Cardio-renal syndrome type 1  Acute hypoxic respiratory failure on admission presumably 2/2 CHF exacerbation Was on 30 L of oxygen on admit--now 15 L NRB -2.8 liters, standing weight Dropped Toprol XL dose 150-->50 Continue hydralazine 100 3 times daily HfpEF on echo grd 2 DD --cardiology consulted for rec's on diuretics--have cut back to 40 daily lasix IV holding clonidine--continue amlodipine 10, continue aspirin 325 AKI superimposed on CKD 4 (prior admission [place] 2021 temporarily on CRRT at that time) Needs discussion with nephrology this admit Await Cardiology input and repeat am labs Hold lisinopril 10, chlorthalidone 25 Chest back leg pains--? 2/2 Azotemia and muliple psychotropics? EKG 1/20 nsr, pr 0.12, no St-T inversion, unchanged from prior Troponin pending Anxiety Cut back Lexapro 20, stop Pamelor 25, trazodone 150 Melatonin tonight Profound hypoglycemia on admit likely secondary to taking on a constant dose of Levemir but not eating adequately--Received D50 x 2 Patient eats his largest meal in the morning and has experienced AM lows might need a.m. long-acting?\ sliding scale =sugars 90-100 eating 100% of meals BPH Resume Flomax 0.4, Proscar 5  Prior vitreous  hemorrhage left eye 2021  DVT prophylaxis: Heparin Code Status: Full Family Communication:  d/w brother at bedside 1/19 Disposition:  Status is: Inpatient  Remains inpatient appropriate because:   Hypoglycemic hypoxic       Consultants:  None at this time  Procedures: Echocardiogram 1/19 is pending  Antimicrobials: None   Subjective: "I dont feel good" Chest pain back pain leg pain No stool x 3-4 d He hesitates to move as he feels worse when he does  Objective: Vitals:   02/02/21 1800 02/02/21 2039 02/02/21 2300 02/03/21 0455  BP:  128/70 (!) 113/59 114/61  Pulse:  98 93 81  Resp:   18   Temp:  99.1 F (37.3 C) 100 F (37.8 C) 98.9 F (37.2 C)  TempSrc:  Oral Oral Oral  SpO2: 91% (!) 89% (!) 86% (!) 89%  Weight:      Height:        Intake/Output Summary (Last 24 hours) at 02/03/2021 1209 Last data filed at 02/03/2021 1125 Gross per 24 hour  Intake 460 ml  Output 425 ml  Net 35 ml    Filed Weights   02/01/21 1346 02/02/21 0443 02/02/21 0850  Weight: 102.8 kg 99.7 kg 99.8 kg    Examination:  Alert African-American male less resp distress No rales rhonchi S1-S2 no murmur NSR Abdomen soft no rebound no guarding Rectal exam--stool brown Trace lower extremity edema Neurologically intact no focal deficit  psych euthymic   Data Reviewed: personally reviewed   CBC    Component Value Date/Time   WBC 8.1 02/03/2021 0425   RBC 2.66 (L) 02/03/2021 0429   RBC 2.64 (L) 02/03/2021 0425   HGB 8.0 (L) 02/03/2021 0425  HGB 10.9 (L) 07/13/2020 1208   HCT 25.1 (L) 02/03/2021 0425   HCT 34.6 (L) 07/13/2020 1208   PLT 280 02/03/2021 0425   PLT 395 07/13/2020 1208   MCV 95.1 02/03/2021 0425   MCV 94 07/13/2020 1208   MCH 30.3 02/03/2021 0425   MCHC 31.9 02/03/2021 0425   RDW 13.1 02/03/2021 0425   RDW 15.1 07/13/2020 1208   LYMPHSABS 1.1 02/03/2021 0425   LYMPHSABS 2.6 08/07/2017 1424   MONOABS 0.7 02/03/2021 0425   EOSABS 0.1 02/03/2021 0425    EOSABS 0.2 08/07/2017 1424   BASOSABS 0.0 02/03/2021 0425   BASOSABS 0.0 08/07/2017 1424   CMP Latest Ref Rng & Units 02/03/2021 02/02/2021 02/01/2021  Glucose 70 - 99 mg/dL 91 106(H) 149(H)  BUN 6 - 20 mg/dL 55(H) 44(H) 43(H)  Creatinine 0.61 - 1.24 mg/dL 3.43(H) 2.19(H) 2.80(H)  Sodium 135 - 145 mmol/L 134(L) 136 135  Potassium 3.5 - 5.1 mmol/L 4.2 4.0 4.4  Chloride 98 - 111 mmol/L 102 102 103  CO2 22 - 32 mmol/L 24 25 24   Calcium 8.9 - 10.3 mg/dL 7.8(L) 8.1(L) 8.2(L)  Total Protein 6.5 - 8.1 g/dL 6.0(L) 5.5(L) 6.6  Total Bilirubin 0.3 - 1.2 mg/dL 0.6 0.4 1.1  Alkaline Phos 38 - 126 U/L 136(H) 115 142(H)  AST 15 - 41 U/L 27 22 33  ALT 0 - 44 U/L 24 22 28      Radiology Studies: DG CHEST PORT 1 VIEW  Result Date: 02/02/2021 CLINICAL DATA:  Acute respiratory failure with hypoxia. EXAM: PORTABLE CHEST 1 VIEW COMPARISON:  02/01/2021 FINDINGS: Borderline cardiomegaly. Interstitial and alveolar pulmonary edema with perihilar manifestation, right more than left. Small bilateral pleural effusions. IMPRESSION: Congestive heart failure pattern, worsened over the last several days. Perihilar edema with enlarging effusions. Electronically Signed   By: Nelson Chimes M.D.   On: 02/02/2021 08:26   DG CHEST PORT 1 VIEW  Result Date: 02/01/2021 CLINICAL DATA:  Respiratory compromise. EXAM: PORTABLE CHEST 1 VIEW COMPARISON:  01/31/2021. FINDINGS: The heart size and mediastinal contours are within normal limits. Perihilar airspace disease is noted bilaterally and increased from the prior exam. No effusion or pneumothorax. No acute osseous abnormality. IMPRESSION: Interval worsening of perihilar airspace disease in the lungs bilaterally, possible edema or infiltrate. Electronically Signed   By: Brett Fairy M.D.   On: 02/01/2021 21:13   ECHOCARDIOGRAM COMPLETE  Result Date: 02/02/2021    ECHOCARDIOGRAM REPORT   Patient Name:   Ryan Jenkins Date of Exam: 02/02/2021 Medical Rec #:  [id number]          Height:       74.0 in Accession #:    [id number]        Weight:       219.8 lb Date of Birth:  1960-06-04         BSA:          2.262 m Patient Age:    80 years          BP:           127/62 mmHg Patient Gender: M                 HR:           75 bpm. Exam Location:  Inpatient Procedure: 2D Echo, Color Doppler and Cardiac Doppler Indications:    ACUTE CHF  History:        Patient has prior history of Echocardiogram examinations, most  recent 07/07/2020. Risk Factors:Diabetes and Hypertension. GERD.  Sonographer:    Beryle Beams Referring Phys: [id number] [place]  1. Left ventricular ejection fraction, by estimation, is 60 to 65%. The left ventricle has normal function. The left ventricle has no regional wall motion abnormalities. There is mild left ventricular hypertrophy. Left ventricular diastolic parameters are consistent with Grade II diastolic dysfunction (pseudonormalization). Elevated left atrial pressure.  2. Right ventricular systolic function is normal. The right ventricular size is normal.  3. The mitral valve is normal in structure. Trivial mitral valve regurgitation. No evidence of mitral stenosis.  4. The aortic valve is tricuspid. Aortic valve regurgitation is not visualized. Aortic valve sclerosis is present, with no evidence of aortic valve stenosis.  5. The inferior vena cava is normal in size with greater than 50% respiratory variability, suggesting right atrial pressure of 3 mmHg. FINDINGS  Left Ventricle: Left ventricular ejection fraction, by estimation, is 60 to 65%. The left ventricle has normal function. The left ventricle has no regional wall motion abnormalities. The left ventricular internal cavity size was normal in size. There is  mild left ventricular hypertrophy. Left ventricular diastolic parameters are consistent with Grade II diastolic dysfunction (pseudonormalization). Elevated left atrial pressure. Right Ventricle: The right ventricular size is  normal. Right ventricular systolic function is normal. Left Atrium: Left atrial size was normal in size. Right Atrium: Right atrial size was normal in size. Pericardium: Trivial pericardial effusion is present. Mitral Valve: The mitral valve is normal in structure. Trivial mitral valve regurgitation. No evidence of mitral valve stenosis. Tricuspid Valve: The tricuspid valve is normal in structure. Tricuspid valve regurgitation is not demonstrated. No evidence of tricuspid stenosis. Aortic Valve: The aortic valve is tricuspid. Aortic valve regurgitation is not visualized. Aortic valve sclerosis is present, with no evidence of aortic valve stenosis. Aortic valve mean gradient measures 3.0 mmHg. Aortic valve peak gradient measures 5.7  mmHg. Aortic valve area, by VTI measures 2.02 cm. Pulmonic Valve: The pulmonic valve was normal in structure. Pulmonic valve regurgitation is not visualized. No evidence of pulmonic stenosis. Aorta: The aortic root is normal in size and structure. Venous: The inferior vena cava is normal in size with greater than 50% respiratory variability, suggesting right atrial pressure of 3 mmHg. IAS/Shunts: No atrial level shunt detected by color flow Doppler.  LEFT VENTRICLE PLAX 2D LVIDd:         4.10 cm     Diastology LVIDs:         3.00 cm     LV e' medial:    6.64 cm/s LV PW:         1.30 cm     LV E/e' medial:  18.4 LV IVS:        1.20 cm     LV e' lateral:   9.03 cm/s LVOT diam:     2.00 cm     LV E/e' lateral: 13.5 LV SV:         48 LV SV Index:   21 LVOT Area:     3.14 cm  LV Volumes (MOD) LV vol d, MOD A2C: 80.1 ml LV vol d, MOD A4C: 89.6 ml LV vol s, MOD A2C: 35.2 ml LV vol s, MOD A4C: 38.3 ml LV SV MOD A2C:     44.9 ml LV SV MOD A4C:     89.6 ml LV SV MOD BP:      47.3 ml RIGHT VENTRICLE RV S prime:  10.50 cm/s TAPSE (M-mode): 2.0 cm LEFT ATRIUM             Index        RIGHT ATRIUM           Index LA diam:        4.60 cm 2.03 cm/m   RA Area:     10.50 cm LA Vol (A2C):   69.8 ml  30.85 ml/m  RA Volume:   19.70 ml  8.71 ml/m LA Vol (A4C):   60.1 ml 26.56 ml/m LA Biplane Vol: 67.5 ml 29.83 ml/m  AORTIC VALVE                    PULMONIC VALVE AV Area (Vmax):    1.92 cm     PV Vmax:       0.52 m/s AV Area (Vmean):   1.90 cm     PV Vmean:      35.100 cm/s AV Area (VTI):     2.02 cm     PV VTI:        0.109 m AV Vmax:           119.00 cm/s  PV Peak grad:  1.1 mmHg AV Vmean:          80.100 cm/s  PV Mean grad:  1.0 mmHg AV VTI:            0.236 m AV Peak Grad:      5.7 mmHg AV Mean Grad:      3.0 mmHg LVOT Vmax:         72.80 cm/s LVOT Vmean:        48.500 cm/s LVOT VTI:          0.152 m LVOT/AV VTI ratio: 0.64  AORTA Ao Root diam: 2.80 cm Ao Asc diam:  3.10 cm MITRAL VALVE MV Area (PHT): 4.71 cm     SHUNTS MV Decel Time: 161 msec     Systemic VTI:  0.15 m MV E velocity: 122.00 cm/s  Systemic Diam: 2.00 cm MV A velocity: 48.00 cm/s MV E/A ratio:  2.54 Kirk Ruths MD Electronically signed by Kirk Ruths MD Signature Date/Time: 02/02/2021/11:27:31 AM    Final      Scheduled Meds:  amLODipine  10 mg Oral Daily   aspirin  325 mg Oral Daily   atorvastatin  40 mg Oral Daily   Chlorhexidine Gluconate Cloth  6 each Topical Daily   escitalopram  20 mg Oral Daily   finasteride  5 mg Oral Daily   [START ON 02/04/2021] furosemide  40 mg Intravenous Daily   heparin  5,000 Units Subcutaneous Q8H   hydrALAZINE  100 mg Oral TID   insulin aspart  0-9 Units Subcutaneous TID WC   mouth rinse  15 mL Mouth Rinse BID   metoprolol succinate  100 mg Oral QHS   nortriptyline  25 mg Oral QHS   pantoprazole  40 mg Oral Daily   Continuous Infusions:   LOS: 2 days   Time spent: [place], MD [facility] To contact the attending provider between 7A-7P or the covering provider during after hours 7P-7A, please log into the web site [email or web address] and access using universal [place] password for that web site. If you do not have the password, please call the hospital  operator.  02/03/2021, 12:09 PM

## 2021-02-03 NOTE — Progress Notes (Addendum)
ANTICOAGULATION CONSULT NOTE - Initial Consult  Pharmacy Consult for heparin  Indication: pulmonary embolus - presumed  Allergies  Allergen Reactions   Claritin [Loratadine] Swelling    Joint swelling    Hydrochlorothiazide Other (See Comments)    Dizziness   Latex Hives   Lyrica [Pregabalin] Other (See Comments)    depression   Metformin And Related Other (See Comments)    GI    Patient Measurements: Height: 6' 2"  (188 cm) Weight: 99.8 kg (220 lb) IBW/kg (Calculated) : 82.2 Heparin Dosing Weight: 99.8  Vital Signs: Temp: 98.8 F (37.1 C) (01/20 1233) Temp Source: Oral (01/20 1233) BP: 125/66 (01/20 1233) Pulse Rate: 91 (01/20 1233)  Labs: Recent Labs    02/01/21 1508 02/01/21 1644 02/02/21 0459 02/03/21 0425 02/03/21 1222 02/03/21 1355  HGB 9.5*  --  8.5* 8.0*  --   --   HCT 29.2*  --  26.2* 25.1*  --   --   PLT 288  --  258 280  --   --   CREATININE 2.80*  --  2.19* 3.43*  --   --   TROPONINIHS 65* 77*  --   --  59* 57*    Estimated Creatinine Clearance: 28.9 mL/min (A) (by C-G formula based on SCr of 3.43 mg/dL (H)).   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 30 tablet 2    atorvastatin (LIPITOR) 40 MG tablet Take 1 tablet (40 mg total) by mouth daily. 30  tablet 2 01/31/2021   D-Mannose 350 MG CAPS Take 1,050 capsules by mouth in the morning and at bedtime.   01/31/2021   escitalopram (LEXAPRO) 20 MG tablet Take 1 tablet (20 mg total) by mouth daily. 30 tablet 3 01/31/2021   finasteride (PROSCAR) 5 MG tablet Take 1 tablet (5 mg total) by mouth daily. 30 tablet 2 01/31/2021   hydrALAZINE (APRESOLINE) 100 MG tablet Take 1 tablet (100 mg total) by mouth 3 (three) times daily. 90 tablet 2 01/31/2021   insulin detemir (LEVEMIR) 100 UNIT/ML injection Inject 0.1 mLs (10 Units total) into the skin daily. (Patient taking differently: Inject 40 Units into the skin at bedtime.) 10 mL 2 01/31/2021   lisinopril (ZESTRIL) 10 MG tablet Take 10 mg by mouth daily.   01/31/2021   metoprolol succinate (TOPROL-XL) 100 MG 24 hr tablet Take 1.5 tablets (150 mg total) by mouth at bedtime. Take with or immediately following a meal. 45 tablet 2 01/31/2021   nortriptyline (PAMELOR) 25 MG capsule Take 1 capsule (25 mg total) by mouth at bedtime. 30 capsule 2 01/31/2021   pantoprazole (PROTONIX) 40 MG tablet Take 1 tablet (40 mg total) by mouth daily. 30 tablet 0    traZODone (DESYREL)  50 MG tablet Take 3 tablets (150 mg total) by mouth at bedtime. (Patient taking differently: Take 150 mg by mouth at bedtime as needed for sleep.) 90 tablet 2 01/31/2021   acetaminophen (TYLENOL) 500 MG tablet Take 1,000 mg by mouth every 6 (six) hours as needed for mild pain or headache.      Blood Glucose Monitoring Suppl (TRUE METRIX METER) w/Device KIT Use as directed 1 kit 0    Blood Pressure Monitor DEVI Use as directed to check home blood pressure 2-3 times a week 1 each 0    cefdinir (OMNICEF) 300 MG capsule Take 1 capsule by mouth daily (Patient not taking: Reported on 12/10/2020) 5 capsule 0    chlorthalidone (HYGROTON) 25 MG tablet Take 0.5 tablets (12.5 mg total) by mouth every other day. (Patient not taking: Reported on 12/10/2020) 30 tablet 1    cloNIDine (CATAPRES - DOSED IN MG/24 HR) 0.1  mg/24hr patch Place 1 patch (0.1 mg total) onto the skin once a week. (Patient not taking: Reported on 02/01/2021) 4 patch 2 Completed Course   cyclobenzaprine (FLEXERIL) 5 MG tablet Take 1 tablet (5 mg total) by mouth 2 (two) times daily as needed for muscle spasms (medication can cause drowsiness). (Patient not taking: Reported on 12/10/2020) 15 tablet 0    furosemide (LASIX) 40 MG tablet Take 1 tablet (40 mg total) by mouth daily as needed for fluid or edema. (Patient not taking: Reported on 12/10/2020) 30 tablet 0    gabapentin (NEURONTIN) 100 MG capsule Take 2 capsules (200 mg total) by mouth daily for 21 days. 42 capsule 0    glucose blood (TRUE METRIX BLOOD GLUCOSE TEST) test strip Use as instructed 100 each 12    hydrALAZINE (APRESOLINE) 25 MG tablet Take 1 tablet (25 mg total) by mouth 3 (three) times daily. (Patient not taking: Reported on 12/10/2020) 90 tablet 2    Insulin Syringe-Needle U-100 (TRUEPLUS INSULIN SYRINGE) 31G X 5/16" 0.3 ML MISC Use to inject Levemir at bedtime. 100 each 0    lisinopril (ZESTRIL) 5 MG tablet Take 1 tablet by mouth daily (Patient not taking: Reported on 12/10/2020) 30 tablet 6    ondansetron (ZOFRAN) 4 MG tablet Take 1 tablet (4 mg total) by mouth every 8 (eight) hours as needed for nausea or vomiting. (Patient not taking: Reported on 12/10/2020) 20 tablet 0    tadalafil (CIALIS) 5 MG tablet Take 1 tablet (5 mg total) by mouth daily. (Patient not taking: Reported on 12/10/2020) 30 tablet 2    tamsulosin (FLOMAX) 0.4 MG CAPS capsule Take 1 capsule (0.4 mg total) by mouth daily after breakfast. (Patient not taking: Reported on 12/10/2020) 30 capsule 2    TRUEplus Lancets 28G MISC Use as directed 100 each 4     Assessment: Pharmacy consulted to dose heparin for r/o PE in 61 yo M with persistent hypoxia. No anticoagulants PTA.  PMH: Prior vitreous hemorrhage left eye 08/04/2019 HG 10.2 on admit> 8.0 today PLT 280 WNL SCr 3.43  No bleeding reported Goal of  Therapy:  Heparin level 0.3-0.7 units/ml Monitor platelets by anticoagulation protocol: Yes   Plan:  DC SQH 5000 units q8h- LD 1336 Heparin bolus 3000 units Heparin drip 1650 units/hr Check 8 hr heparin level Daily heparin level & CBC while on heparin F/u VQ scan> DC heparin if negative for PE per MD orders  Eudelia Bunch, Pharm.D 02/03/2021 5:07 PM

## 2021-02-04 ENCOUNTER — Inpatient Hospital Stay (HOSPITAL_COMMUNITY): Payer: Medicaid Other

## 2021-02-04 LAB — GLUCOSE, CAPILLARY
Glucose-Capillary: 111 mg/dL — ABNORMAL HIGH (ref 70–99)
Glucose-Capillary: 141 mg/dL — ABNORMAL HIGH (ref 70–99)
Glucose-Capillary: 141 mg/dL — ABNORMAL HIGH (ref 70–99)
Glucose-Capillary: 152 mg/dL — ABNORMAL HIGH (ref 70–99)

## 2021-02-04 LAB — BASIC METABOLIC PANEL
Anion gap: 10 (ref 5–15)
BUN: 62 mg/dL — ABNORMAL HIGH (ref 6–20)
CO2: 23 mmol/L (ref 22–32)
Calcium: 8 mg/dL — ABNORMAL LOW (ref 8.9–10.3)
Chloride: 101 mmol/L (ref 98–111)
Creatinine, Ser: 3.57 mg/dL — ABNORMAL HIGH (ref 0.61–1.24)
GFR, Estimated: 19 mL/min — ABNORMAL LOW (ref >60)
Glucose, Bld: 132 mg/dL — ABNORMAL HIGH (ref 70–99)
Potassium: 4.4 mmol/L (ref 3.5–5.1)
Sodium: 134 mmol/L — ABNORMAL LOW (ref 135–145)

## 2021-02-04 LAB — CBC WITH DIFFERENTIAL/PLATELET
Abs Immature Granulocytes: 0.03 10*3/uL (ref 0.00–0.07)
Basophils Absolute: 0 10*3/uL (ref 0.0–0.1)
Basophils Relative: 0 %
Eosinophils Absolute: 0.1 10*3/uL (ref 0.0–0.5)
Eosinophils Relative: 2 %
HCT: 24.9 % — ABNORMAL LOW (ref 39.0–52.0)
Hemoglobin: 8.2 g/dL — ABNORMAL LOW (ref 13.0–17.0)
Immature Granulocytes: 0 %
Lymphocytes Relative: 14 %
Lymphs Abs: 1 10*3/uL (ref 0.7–4.0)
MCH: 30.7 pg (ref 26.0–34.0)
MCHC: 32.9 g/dL (ref 30.0–36.0)
MCV: 93.3 fL (ref 80.0–100.0)
Monocytes Absolute: 0.6 10*3/uL (ref 0.1–1.0)
Monocytes Relative: 9 %
Neutro Abs: 5.3 10*3/uL (ref 1.7–7.7)
Neutrophils Relative %: 75 %
Platelets: 322 10*3/uL (ref 150–400)
RBC: 2.67 MIL/uL — ABNORMAL LOW (ref 4.22–5.81)
RDW: 13.1 % (ref 11.5–15.5)
WBC: 7.1 10*3/uL (ref 4.0–10.5)
nRBC: 0 % (ref 0.0–0.2)

## 2021-02-04 LAB — BRAIN NATRIURETIC PEPTIDE: B Natriuretic Peptide: 803.1 pg/mL — ABNORMAL HIGH (ref 0.0–100.0)

## 2021-02-04 MED ORDER — AMLODIPINE BESYLATE 5 MG PO TABS
2.5000 mg | ORAL_TABLET | Freq: Every day | ORAL | Status: DC
  Administered 2021-02-05 – 2021-02-07 (×3): 2.5 mg via ORAL
  Filled 2021-02-04 (×3): qty 1

## 2021-02-04 MED ORDER — LIP MEDEX EX OINT
1.0000 "application " | TOPICAL_OINTMENT | CUTANEOUS | Status: DC | PRN
  Administered 2021-02-04: 1 via TOPICAL
  Filled 2021-02-04: qty 7

## 2021-02-04 MED ORDER — FUROSEMIDE 10 MG/ML IJ SOLN
60.0000 mg | Freq: Two times a day (BID) | INTRAMUSCULAR | Status: DC
Start: 2021-02-04 — End: 2021-02-07
  Administered 2021-02-04 – 2021-02-07 (×6): 60 mg via INTRAVENOUS
  Filled 2021-02-04 (×6): qty 6

## 2021-02-04 NOTE — Progress Notes (Signed)
°   02/04/21 0900  Mobility  Activity Contraindicated/medical hold   Pt O2 levels too high. Need to be equal to or <6L for mobility specialist. Hold mobility.  [facility] Office: [phone]

## 2021-02-04 NOTE — Progress Notes (Addendum)
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  SOB SPO2 85% on 7 L increased to 15 L sats 88-94% and still requiring NRB 15 L Also apparently dropped blood sugar 28  Hospital-Problem based course  NSTEMI Cardio-renal syndrome type 1, HFpEF grade 2 diastolic dysfunction EF 60 to 65% this admission Acute hypoxic respiratory failure on admission presumably 2/2 CHF exacerbation Was on 30 L of oxygen on admit--now 15 L--attempt to wean--OOB as able -3.2 liters, wght 102--->99.8 Dropped Toprol XL dose 150-->50 Continue hydralazine 100 3 times daily holding clonidine amlodipine 10-->2.5, continue aspirin 325 PE ruled out with negative VQ scan 1/20 Improved hypoxia is explained by cardiorenal syndrome discontinue Doppler ultrasound bilaterally AKI superimposed on CKD 4 (prior admission [place] 2021 temporarily on CRRT at that time) Nephrology consulted 1/21 --I have discussed the patient with Dr. Percell Locus recommends Lasix 60 mg IV twice daily and titrate Hold lisinopril 10, chlorthalidone 25, amlodipine Chest back leg pains--? 2/2 Azotemia and muliple psychotropics? Etiology unclear Monitor while on tramadol in setting azotemia Anxiety Cut back Lexapro 20, stop Pamelor 25, trazodone 150 Melatonin to continue Profound hypoglycemia on admit 2/2 not eating adequately--Received D50 x 2 Patient eats his largest meal in the morning sliding scale alone for now =sugars 100-140 eating 100% of  meals BPH Resume Flomax 0.4, Proscar 5 Prior vitreous hemorrhage left eye 2021  DVT prophylaxis: Heparin Code Status: Full Family Communication: No family present today--will call sister  Disposition:  Status is: Inpatient  Remains inpatient appropriate because:   Hypoglycemic hypoxic Might need HD   Consultants:  None at this time  Procedures: Echocardiogram 1/19 is pending  Antimicrobials: None   Subjective:  "How are you going to fix me" I discussed with him briefly the real possibility of him going on dialysis I told him that he looks a lot better than he did yesterday but that the nephrologist will see him and discuss in more detail He seems unsurprised by this His shortness of breath is much better today He is able to sit up in the bed without significant distress He has no taste for food and he forced himself to have soup this morning  Objective: Vitals:   02/03/21 0455 02/03/21 1233 02/03/21 2018 02/04/21 0300  BP: 114/61 125/66 131/66 133/76  Pulse: 81 91 91 72  Resp:      Temp: 98.9 F (37.2 C) 98.8 F (37.1 C) (!) 97.5 F (36.4 C) 97.8 F (36.6 C)  TempSrc: Oral Oral Oral Oral  SpO2: (!) 89% (!) 88% (!) 87% (!) 89%  Weight:      Height:        Intake/Output Summary (Last 24 hours) at 02/04/2021 0832 Last data filed at 02/03/2021 1800 Gross per 24 hour  Intake 150.63 ml  Output 550 ml  Net -399.37 ml    Filed Weights   02/01/21 1346 02/02/21 0443 02/02/21 0850  Weight: 102.8 kg 99.7 kg 99.8 kg    Examination:  EOMI NCAT no icterus Mild JVD, positive hepatojugular S1-S2 sinus Abdomen soft Slight  rales posterolaterally left side Neurologically intact slightly tremulous Power 5/5 otherwise Psych-affect flat  Data Reviewed: personally reviewed   CBC    Component Value Date/Time   WBC 7.1 02/04/2021 0754   RBC 2.67 (L) 02/04/2021 0754   HGB 8.2 (L) 02/04/2021 0754   HGB 10.9 (L) 07/13/2020 1208   HCT 24.9 (L) 02/04/2021 0754    HCT 34.6 (L) 07/13/2020 1208   PLT 322 02/04/2021 0754   PLT 395 07/13/2020 1208   MCV 93.3 02/04/2021 0754   MCV 94 07/13/2020 1208   MCH 30.7 02/04/2021 0754   MCHC 32.9 02/04/2021 0754   RDW 13.1 02/04/2021 0754   RDW 15.1 07/13/2020 1208   LYMPHSABS 1.0 02/04/2021 0754   LYMPHSABS 2.6 08/07/2017 1424   MONOABS 0.6 02/04/2021 0754   EOSABS 0.1 02/04/2021 0754   EOSABS 0.2 08/07/2017 1424   BASOSABS 0.0 02/04/2021 0754   BASOSABS 0.0 08/07/2017 1424   CMP Latest Ref Rng & Units 02/04/2021 02/03/2021 02/02/2021  Glucose 70 - 99 mg/dL 132(H) 91 106(H)  BUN 6 - 20 mg/dL 62(H) 55(H) 44(H)  Creatinine 0.61 - 1.24 mg/dL 3.57(H) 3.43(H) 2.19(H)  Sodium 135 - 145 mmol/L 134(L) 134(L) 136  Potassium 3.5 - 5.1 mmol/L 4.4 4.2 4.0  Chloride 98 - 111 mmol/L 101 102 102  CO2 22 - 32 mmol/L 23 24 25   Calcium 8.9 - 10.3 mg/dL 8.0(L) 7.8(L) 8.1(L)  Total Protein 6.5 - 8.1 g/dL - 6.0(L) 5.5(L)  Total Bilirubin 0.3 - 1.2 mg/dL - 0.6 0.4  Alkaline Phos 38 - 126 U/L - 136(H) 115  AST 15 - 41 U/L - 27 22  ALT 0 - 44 U/L - 24 22     Radiology Studies: NM Pulmonary Perfusion  Result Date: 02/03/2021 CLINICAL DATA:  Worsening shortness of breath. EXAM: NUCLEAR MEDICINE PERFUSION LUNG SCAN TECHNIQUE: Perfusion images were obtained in multiple projections after intravenous injection of radiopharmaceutical. Ventilation scans intentionally deferred if perfusion scan and chest x-ray adequate for interpretation during COVID 19 epidemic. RADIOPHARMACEUTICALS:  4.3 millicuries mCi [id number] MAA IV COMPARISON:  None. FINDINGS: Predominantly homogeneous distribution of tracer activity is seen throughout both lungs. No focal segmental or subsegmental perfusion defects are identified. IMPRESSION: Normal nuclear medicine perfusion lung scan. Electronically Signed   By: Virgina Norfolk M.D.   On: 02/03/2021 19:11   DG CHEST PORT 1 VIEW  Result Date: 02/04/2021 CLINICAL DATA:  History of pneumonia. EXAM: PORTABLE  CHEST 1 VIEW COMPARISON:  Chest radiograph 02/02/2021. FINDINGS: Monitoring leads overlie the patient. Stable cardiac and mediastinal contours. Slight interval increase in right-greater-than-left midlung consolidative opacities. No pleural effusion or pneumothorax. IMPRESSION: Interval increase in right greater than left predominantly perihilar consolidation which may represent pneumonia in the appropriate clinical setting. Followup PA and lateral chest X-ray is recommended in 3-4 weeks following trial of antibiotic therapy to ensure resolution and exclude underlying malignancy. Electronically Signed   By: Lovey Newcomer M.D.   On: 02/04/2021 08:11     Scheduled Meds:  amLODipine  10 mg Oral Daily   aspirin EC  81 mg Oral Daily   atorvastatin  40 mg Oral Daily   Chlorhexidine Gluconate Cloth  6 each Topical Daily   escitalopram  5 mg Oral Daily   finasteride  5 mg Oral Daily   furosemide  40 mg Intravenous Daily   heparin  5,000 Units Subcutaneous Q8H   hydrALAZINE  100 mg Oral TID   insulin aspart  0-9 Units Subcutaneous TID WC  mouth rinse  15 mL Mouth Rinse BID   melatonin  3 mg Oral QHS   metoprolol succinate  50 mg Oral QHS   pantoprazole  40 mg Oral Daily   polyethylene glycol  17 g Oral Daily   Continuous Infusions:   LOS: 3 days   Time spent: [place], MD [facility] To contact the attending provider between 7A-7P or the covering provider during after hours 7P-7A, please log into the web site [email or web address] and access using universal [place] password for that web site. If you do not have the password, please call the hospital operator.  02/04/2021, 8:32 AM

## 2021-02-04 NOTE — Progress Notes (Signed)
Progress Note  Patient Name: Ryan Jenkins Date of Encounter: 02/04/2021  [facility] Cardiologist: None   Subjective   Feeling well.  Breathing improving.   Inpatient Medications    Scheduled Meds:  amLODipine  10 mg Oral Daily   aspirin EC  81 mg Oral Daily   aspirin  325 mg Oral Daily   atorvastatin  40 mg Oral Daily   Chlorhexidine Gluconate Cloth  6 each Topical Daily   escitalopram  5 mg Oral Daily   finasteride  5 mg Oral Daily   furosemide  40 mg Intravenous Daily   heparin  5,000 Units Subcutaneous Q8H   hydrALAZINE  100 mg Oral TID   insulin aspart  0-9 Units Subcutaneous TID WC   mouth rinse  15 mL Mouth Rinse BID   melatonin  3 mg Oral QHS   metoprolol succinate  50 mg Oral QHS   pantoprazole  40 mg Oral Daily   polyethylene glycol  17 g Oral Daily   Continuous Infusions:  PRN Meds: acetaminophen **OR** acetaminophen, albuterol, guaiFENesin-dextromethorphan, ondansetron **OR** ondansetron (ZOFRAN) IV   Vital Signs    Vitals:   02/03/21 0455 02/03/21 1233 02/03/21 2018 02/04/21 0300  BP: 114/61 125/66 131/66 133/76  Pulse: 81 91 91 72  Resp:      Temp: 98.9 F (37.2 C) 98.8 F (37.1 C) (!) 97.5 F (36.4 C) 97.8 F (36.6 C)  TempSrc: Oral Oral Oral Oral  SpO2: (!) 89% (!) 88% (!) 87% (!) 89%  Weight:      Height:        Intake/Output Summary (Last 24 hours) at 02/04/2021 0625 Last data filed at 02/03/2021 1800 Gross per 24 hour  Intake 150.63 ml  Output 850 ml  Net -699.37 ml   Last 3 Weights 02/02/2021 02/02/2021 02/01/2021  Weight (lbs) 220 lb 219 lb 12.8 oz 226 lb 9.6 oz  Weight (kg) 99.791 kg 99.701 kg 102.785 kg      Telemetry    Sinus rhythm - Personally Reviewed  ECG    N/a - Personally Reviewed  Physical Exam   GEN: No acute distress.   Neck: +JVP to mandible Cardiac: RRR, no murmurs, rubs, or gallops.  Respiratory: Mild crackles bilaterally GI: Soft, nontender, non-distended  MS: No edema; No deformity. Neuro:   Nonfocal  Psych: Normal affect   Labs    High Sensitivity Troponin:   Recent Labs  Lab 02/01/21 0916 02/01/21 1508 02/01/21 1644 02/03/21 1222 02/03/21 1355  TROPONINIHS 68* 65* 77* 59* 57*     Chemistry Recent Labs  Lab 02/01/21 1508 02/02/21 0459 02/03/21 0425  NA 135 136 134*  K 4.4 4.0 4.2  CL 103 102 102  CO2 24 25 24   GLUCOSE 149* 106* 91  BUN 43* 44* 55*  CREATININE 2.80* 2.19* 3.43*  CALCIUM 8.2* 8.1* 7.8*  MG  --   --  2.1  PROT 6.6 5.5* 6.0*  ALBUMIN 3.1* 2.6* 2.5*  AST 33 22 27  ALT 28 22 24   ALKPHOS 142* 115 136*  BILITOT 1.1 0.4 0.6  GFRNONAA 25* 34* 20*  ANIONGAP 8 9 8     Lipids No results for input(s): CHOL, TRIG, HDL, LABVLDL, LDLCALC, CHOLHDL in the last 168 hours.  Hematology Recent Labs  Lab 02/01/21 1508 02/02/21 0459 02/03/21 0425 02/03/21 0429  WBC 9.5 7.7 8.1  --   RBC 3.14* 2.80* 2.64* 2.66*  HGB 9.5* 8.5* 8.0*  --   HCT 29.2* 26.2* 25.1*  --  MCV 93.0 93.6 95.1  --   MCH 30.3 30.4 30.3  --   MCHC 32.5 32.4 31.9  --   RDW 13.0 12.9 13.1  --   PLT 288 258 280  --    Thyroid No results for input(s): TSH, FREET4 in the last 168 hours.  BNP Recent Labs  Lab 01/31/21 1929 02/01/21 0916  BNP 645.8* 862.4*    DDimer  Recent Labs  Lab 02/01/21 1644  DDIMER 1.26*     Radiology    NM Pulmonary Perfusion  Result Date: 02/03/2021 CLINICAL DATA:  Worsening shortness of breath. EXAM: NUCLEAR MEDICINE PERFUSION LUNG SCAN TECHNIQUE: Perfusion images were obtained in multiple projections after intravenous injection of radiopharmaceutical. Ventilation scans intentionally deferred if perfusion scan and chest x-ray adequate for interpretation during COVID 19 epidemic. RADIOPHARMACEUTICALS:  4.3 millicuries mCi EG-[id number] MAA IV COMPARISON:  None. FINDINGS: Predominantly homogeneous distribution of tracer activity is seen throughout both lungs. No focal segmental or subsegmental perfusion defects are identified. IMPRESSION: Normal nuclear  medicine perfusion lung scan. Electronically Signed   By: Virgina Norfolk M.D.   On: 02/03/2021 19:11   DG CHEST PORT 1 VIEW  Result Date: 02/02/2021 CLINICAL DATA:  Acute respiratory failure with hypoxia. EXAM: PORTABLE CHEST 1 VIEW COMPARISON:  02/01/2021 FINDINGS: Borderline cardiomegaly. Interstitial and alveolar pulmonary edema with perihilar manifestation, right more than left. Small bilateral pleural effusions. IMPRESSION: Congestive heart failure pattern, worsened over the last several days. Perihilar edema with enlarging effusions. Electronically Signed   By: Nelson Chimes M.D.   On: 02/02/2021 08:26   ECHOCARDIOGRAM COMPLETE  Result Date: 02/02/2021    ECHOCARDIOGRAM REPORT   Patient Name:   Ryan Jenkins Date of Exam: 02/02/2021 Medical Rec #:  [id number]         Height:       74.0 in Accession #:    [id number]        Weight:       219.8 lb Date of Birth:  11/29/1960         BSA:          2.262 m Patient Age:    61 years          BP:           127/62 mmHg Patient Gender: M                 HR:           75 bpm. Exam Location:  Inpatient Procedure: 2D Echo, Color Doppler and Cardiac Doppler Indications:    ACUTE CHF  History:        Patient has prior history of Echocardiogram examinations, most                 recent 07/07/2020. Risk Factors:Diabetes and Hypertension. GERD.  Sonographer:    Beryle Beams Referring Phys: [id number] Martin  1. Left ventricular ejection fraction, by estimation, is 60 to 65%. The left ventricle has normal function. The left ventricle has no regional wall motion abnormalities. There is mild left ventricular hypertrophy. Left ventricular diastolic parameters are consistent with Grade II diastolic dysfunction (pseudonormalization). Elevated left atrial pressure.  2. Right ventricular systolic function is normal. The right ventricular size is normal.  3. The mitral valve is normal in structure. Trivial mitral valve regurgitation. No evidence of mitral  stenosis.  4. The aortic valve is tricuspid. Aortic valve regurgitation is not visualized. Aortic valve sclerosis is present, with no evidence  of aortic valve stenosis.  5. The inferior vena cava is normal in size with greater than 50% respiratory variability, suggesting right atrial pressure of 3 mmHg. FINDINGS  Left Ventricle: Left ventricular ejection fraction, by estimation, is 60 to 65%. The left ventricle has normal function. The left ventricle has no regional wall motion abnormalities. The left ventricular internal cavity size was normal in size. There is  mild left ventricular hypertrophy. Left ventricular diastolic parameters are consistent with Grade II diastolic dysfunction (pseudonormalization). Elevated left atrial pressure. Right Ventricle: The right ventricular size is normal. Right ventricular systolic function is normal. Left Atrium: Left atrial size was normal in size. Right Atrium: Right atrial size was normal in size. Pericardium: Trivial pericardial effusion is present. Mitral Valve: The mitral valve is normal in structure. Trivial mitral valve regurgitation. No evidence of mitral valve stenosis. Tricuspid Valve: The tricuspid valve is normal in structure. Tricuspid valve regurgitation is not demonstrated. No evidence of tricuspid stenosis. Aortic Valve: The aortic valve is tricuspid. Aortic valve regurgitation is not visualized. Aortic valve sclerosis is present, with no evidence of aortic valve stenosis. Aortic valve mean gradient measures 3.0 mmHg. Aortic valve peak gradient measures 5.7  mmHg. Aortic valve area, by VTI measures 2.02 cm. Pulmonic Valve: The pulmonic valve was normal in structure. Pulmonic valve regurgitation is not visualized. No evidence of pulmonic stenosis. Aorta: The aortic root is normal in size and structure. Venous: The inferior vena cava is normal in size with greater than 50% respiratory variability, suggesting right atrial pressure of 3 mmHg. IAS/Shunts: No atrial  level shunt detected by color flow Doppler.  LEFT VENTRICLE PLAX 2D LVIDd:         4.10 cm     Diastology LVIDs:         3.00 cm     LV e' medial:    6.64 cm/s LV PW:         1.30 cm     LV E/e' medial:  18.4 LV IVS:        1.20 cm     LV e' lateral:   9.03 cm/s LVOT diam:     2.00 cm     LV E/e' lateral: 13.5 LV SV:         48 LV SV Index:   21 LVOT Area:     3.14 cm  LV Volumes (MOD) LV vol d, MOD A2C: 80.1 ml LV vol d, MOD A4C: 89.6 ml LV vol s, MOD A2C: 35.2 ml LV vol s, MOD A4C: 38.3 ml LV SV MOD A2C:     44.9 ml LV SV MOD A4C:     89.6 ml LV SV MOD BP:      47.3 ml RIGHT VENTRICLE RV S prime:     10.50 cm/s TAPSE (M-mode): 2.0 cm LEFT ATRIUM             Index        RIGHT ATRIUM           Index LA diam:        4.60 cm 2.03 cm/m   RA Area:     10.50 cm LA Vol (A2C):   69.8 ml 30.85 ml/m  RA Volume:   19.70 ml  8.71 ml/m LA Vol (A4C):   60.1 ml 26.56 ml/m LA Biplane Vol: 67.5 ml 29.83 ml/m  AORTIC VALVE                    PULMONIC VALVE  AV Area (Vmax):    1.92 cm     PV Vmax:       0.52 m/s AV Area (Vmean):   1.90 cm     PV Vmean:      35.100 cm/s AV Area (VTI):     2.02 cm     PV VTI:        0.109 m AV Vmax:           119.00 cm/s  PV Peak grad:  1.1 mmHg AV Vmean:          80.100 cm/s  PV Mean grad:  1.0 mmHg AV VTI:            0.236 m AV Peak Grad:      5.7 mmHg AV Mean Grad:      3.0 mmHg LVOT Vmax:         72.80 cm/s LVOT Vmean:        48.500 cm/s LVOT VTI:          0.152 m LVOT/AV VTI ratio: 0.64  AORTA Ao Root diam: 2.80 cm Ao Asc diam:  3.10 cm MITRAL VALVE MV Area (PHT): 4.71 cm     SHUNTS MV Decel Time: 161 msec     Systemic VTI:  0.15 m MV E velocity: 122.00 cm/s  Systemic Diam: 2.00 cm MV A velocity: 48.00 cm/s MV E/A ratio:  2.54 Kirk Ruths MD Electronically signed by Kirk Ruths MD Signature Date/Time: 02/02/2021/11:27:31 AM    Final     Cardiac Studies   Echo 02/02/21: 1. Left ventricular ejection fraction, by estimation, is 60 to 65%. The  left ventricle has normal function.  The left ventricle has no regional  wall motion abnormalities. There is mild left ventricular hypertrophy.  Left ventricular diastolic parameters  are consistent with Grade II diastolic dysfunction (pseudonormalization).  Elevated left atrial pressure.   2. Right ventricular systolic function is normal. The right ventricular  size is normal.   3. The mitral valve is normal in structure. Trivial mitral valve  regurgitation. No evidence of mitral stenosis.   4. The aortic valve is tricuspid. Aortic valve regurgitation is not  visualized. Aortic valve sclerosis is present, with no evidence of aortic  valve stenosis.   5. The inferior vena cava is normal in size with greater than 50%  respiratory variability, suggesting right atrial pressure of 3 mmHg.   Patient Profile     61 y.o. male with chronic diastolic heart failure, uncontrolled diabetes, hypertension, CKD 4, GERD, vitreous hemorrhage of the left eye, moderate CAD with hypoxic respiratory failure and acute on chronic diastolic heart failure and acute on chronic renal failure.  Assessment & Plan    #Acute on chronic diastolic heart failure: #Hypoxic respiratory failure: Renal function is worsened with attempted diuresis and his urine output has been poor.  Renal function slightly worse and persistent pulmonary edema on CXR.  Agree with nephrology consultation as he may need CVVHD as in the past for volume removal.  # AKI superimposed on CKD 4: Chlorthalidone and his ACE inhibitor have been held due to worsening renal function.  Renal function worsened with increasing his diuretic dose yet he remains volume overloaded and urine output has been poor.  Lasix was reduced.  Agree with nephrology consultation.  #CAD: #Elevated troponin: Cath in 2001 revealed 60% RCA disease.  Has had intermittent chest pain and initial EKG was concerning for ischemia.  High-sensitivity troponin has been mildly elevated and flat.  Given  his chronic kidney  disease recommend an attempt at medical therapy prior to proceeding with cardiac catheterization.  Aspirin reduced to 81 mg daily.  Continue metoprolol.  #Hypertension: Blood pressure well controlled.  Continue amlodipine, hydralazine, metoprolol  #Diabetes mellitus: Very poorly controlled.  Hemoglobin A1c 13.4%.     For questions or updates, please contact Julian Please consult [email or web address] for contact info under        Signed, Skeet Latch, MD  02/04/2021, 6:25 AM

## 2021-02-05 ENCOUNTER — Inpatient Hospital Stay (HOSPITAL_COMMUNITY): Payer: Medicaid Other

## 2021-02-05 DIAGNOSIS — N179 Acute kidney failure, unspecified: Secondary | ICD-10-CM

## 2021-02-05 DIAGNOSIS — N184 Chronic kidney disease, stage 4 (severe): Secondary | ICD-10-CM

## 2021-02-05 LAB — CBC WITH DIFFERENTIAL/PLATELET
Abs Immature Granulocytes: 0.03 10*3/uL (ref 0.00–0.07)
Basophils Absolute: 0 10*3/uL (ref 0.0–0.1)
Basophils Relative: 0 %
Eosinophils Absolute: 0.2 10*3/uL (ref 0.0–0.5)
Eosinophils Relative: 2 %
HCT: 26.1 % — ABNORMAL LOW (ref 39.0–52.0)
Hemoglobin: 8.3 g/dL — ABNORMAL LOW (ref 13.0–17.0)
Immature Granulocytes: 1 %
Lymphocytes Relative: 14 %
Lymphs Abs: 0.9 10*3/uL (ref 0.7–4.0)
MCH: 30 pg (ref 26.0–34.0)
MCHC: 31.8 g/dL (ref 30.0–36.0)
MCV: 94.2 fL (ref 80.0–100.0)
Monocytes Absolute: 0.6 10*3/uL (ref 0.1–1.0)
Monocytes Relative: 9 %
Neutro Abs: 4.9 10*3/uL (ref 1.7–7.7)
Neutrophils Relative %: 74 %
Platelets: 359 10*3/uL (ref 150–400)
RBC: 2.77 MIL/uL — ABNORMAL LOW (ref 4.22–5.81)
RDW: 12.8 % (ref 11.5–15.5)
WBC: 6.7 10*3/uL (ref 4.0–10.5)
nRBC: 0 % (ref 0.0–0.2)

## 2021-02-05 LAB — GLUCOSE, CAPILLARY
Glucose-Capillary: 147 mg/dL — ABNORMAL HIGH (ref 70–99)
Glucose-Capillary: 152 mg/dL — ABNORMAL HIGH (ref 70–99)
Glucose-Capillary: 140 mg/dL — ABNORMAL HIGH (ref 70–99)
Glucose-Capillary: 189 mg/dL — ABNORMAL HIGH (ref 70–99)

## 2021-02-05 LAB — COMPREHENSIVE METABOLIC PANEL
ALT: 18 U/L (ref 0–44)
AST: 14 U/L — ABNORMAL LOW (ref 15–41)
Albumin: 2.5 g/dL — ABNORMAL LOW (ref 3.5–5.0)
Alkaline Phosphatase: 165 U/L — ABNORMAL HIGH (ref 38–126)
Anion gap: 9 (ref 5–15)
BUN: 63 mg/dL — ABNORMAL HIGH (ref 6–20)
CO2: 24 mmol/L (ref 22–32)
Calcium: 8.2 mg/dL — ABNORMAL LOW (ref 8.9–10.3)
Chloride: 101 mmol/L (ref 98–111)
Creatinine, Ser: 3.31 mg/dL — ABNORMAL HIGH (ref 0.61–1.24)
GFR, Estimated: 20 mL/min — ABNORMAL LOW (ref >60)
Glucose, Bld: 153 mg/dL — ABNORMAL HIGH (ref 70–99)
Potassium: 4.2 mmol/L (ref 3.5–5.1)
Sodium: 134 mmol/L — ABNORMAL LOW (ref 135–145)
Total Bilirubin: 0.9 mg/dL (ref 0.3–1.2)
Total Protein: 6.3 g/dL — ABNORMAL LOW (ref 6.5–8.1)

## 2021-02-05 LAB — BRAIN NATRIURETIC PEPTIDE: B Natriuretic Peptide: 863.4 pg/mL — ABNORMAL HIGH (ref 0.0–100.0)

## 2021-02-05 LAB — CREATININE, URINE, RANDOM: Creatinine, Urine: 35.21 mg/dL

## 2021-02-05 LAB — SODIUM, URINE, RANDOM: Sodium, Ur: 106 mmol/L

## 2021-02-05 MED ORDER — SALINE SPRAY 0.65 % NA SOLN
1.0000 | NASAL | Status: DC | PRN
  Administered 2021-02-07 (×2): 1 via NASAL
  Filled 2021-02-05 (×2): qty 44

## 2021-02-05 MED ORDER — LIDOCAINE 5 % EX PTCH
1.0000 | MEDICATED_PATCH | CUTANEOUS | Status: DC
  Administered 2021-02-05 – 2021-02-08 (×4): 1 via TRANSDERMAL
  Filled 2021-02-05 (×4): qty 1

## 2021-02-05 MED ORDER — TRAMADOL HCL 50 MG PO TABS
50.0000 mg | ORAL_TABLET | Freq: Four times a day (QID) | ORAL | Status: DC | PRN
  Administered 2021-02-05 – 2021-02-06 (×2): 50 mg via ORAL
  Filled 2021-02-05 (×2): qty 1

## 2021-02-05 MED ORDER — MENTHOL 3 MG MT LOZG
1.0000 | LOZENGE | OROMUCOSAL | Status: DC | PRN
  Administered 2021-02-05: 3 mg via ORAL
  Filled 2021-02-05: qty 9

## 2021-02-05 NOTE — Progress Notes (Signed)
PROGRESS NOTE   Ryan Jenkins  QIO:[id number] DOB: [birth year] DOA: 01/31/2021 PCP: Armanda Heritage, NP  Brief Narrative:  61 year old African-American male DM ty ii + Neuropathy htn ckd iv (baseline creatinine 2.5) Prior admission [place] 2021,-->CRRT CPPD BPH Anxiety Prior vitreous hemorrhage left eye 08/04/2019  presented to med Center 01/31/2021 SOB DOE bilateral LL edema oliguria Work-up = BNP 645-Rx 60 Lasix at [facility]- found to have NSTEMI 2/2 demand ischemia CXR = pulmonary vascular congestion  Overnight 1/18 developed ?  SOB SPO2 85% on 7 L increased to 15 L sats 88-94% and still requiring NRB 15 L Also apparently dropped blood sugar 28  Subsequently aggressively diuresed prompting Cardiology and Nephrology input May require Hd planning this admit?  Hospital-Problem based course  NSTEMI Cardio-renal syndrome type 1, HFpEF grade 2 diastolic dysfunction EF 60 to 65% this admission Acute CHF exacerbation Was on 30 L of oxygen on admit--I cut back to 13 L--sats stayed in 90's -4.7 liters, wght 102--->98.7 Dropped Toprol XL dose 150-->50 Continue hydralazine 100 3 times daily holding clonidine amlodipine 10-->2.5, continue aspirin 325 Nutritionist consulted to assist with diet explanation hypoxic respiratory failure on admission presumably 2/2 above PE ruled out with negative VQ scan 1/20 As still having hypoxia, follow Respiratory viral panel ord 1/22 Get 2 vw dedicated cxr AKI superimposed on CKD 4 (prior admission [place] 2021 temporarily on CRRT at that time) Nephrology consulted 1/21 -- Dr. Jonnie Finner to see Continue Lasix 60 mg IV twice daily and titrate Hold lisinopril 10, chlorthalidone 25, amlodipine cut back Chest back leg pains--? 2/2 Azotemia and muliple psychotropics? Etiology unclear Cautious use tramadol in setting azotemia Anxiety Cut back Lexapro 20, stop Pamelor 25, trazodone 150 Melatonin to continue Profound hypoglycemia on admit 2/2 not  eating adequately--Received D50 x 2 Patient eats his largest meal in the morning sliding scale alone for now =sugars 140-160 eating 100% of meals BPH Resume Flomax 0.4, Proscar 5 Prior vitreous hemorrhage left eye 2021  DVT prophylaxis: Heparin Code Status: Full Family Communication: No family present today--will call sister  Disposition:  Status is: Inpatient  Remains inpatient appropriate because:   Hypoglycemic hypoxic Might need HD   Consultants:  None at this time  Procedures: Echocardiogram 1/19 is pending  Antimicrobials: None   Subjective:  "I feel about the same What are you going to give me for my back" No cp no fever No n/v  Objective: Vitals:   02/04/21 1420 02/04/21 2150 02/05/21 0015 02/05/21 0630  BP: 132/65 134/64  (!) 144/69  Pulse: 87 89  88  Resp: 17     Temp: 98.1 F (36.7 C) 98 F (36.7 C)  99 F (37.2 C)  TempSrc: Oral Oral  Oral  SpO2: 93% 93% 95% 90%  Weight:    98.7 kg  Height:        Intake/Output Summary (Last 24 hours) at 02/05/2021 1102 Last data filed at 02/05/2021 0700 Gross per 24 hour  Intake --  Output 1500 ml  Net -1500 ml    Filed Weights   02/02/21 0443 02/02/21 0850 02/05/21 0630  Weight: 99.7 kg 99.8 kg 98.7 kg    Examination:  EOMI NCAT no icterus Neck veins less distended S1-S2 sinus Abdomen soft Slight rales posterolaterally Right side Less tremulous Power 5/5 otherwise  Data Reviewed: personally reviewed   CBC    Component Value Date/Time   WBC 6.7 02/05/2021 0437   RBC 2.77 (L) 02/05/2021 0437   HGB 8.3 (L) 02/05/2021 3244  HGB 10.9 (L) 07/13/2020 1208   HCT 26.1 (L) 02/05/2021 0437   HCT 34.6 (L) 07/13/2020 1208   PLT 359 02/05/2021 0437   PLT 395 07/13/2020 1208   MCV 94.2 02/05/2021 0437   MCV 94 07/13/2020 1208   MCH 30.0 02/05/2021 0437   MCHC 31.8 02/05/2021 0437   RDW 12.8 02/05/2021 0437   RDW 15.1 07/13/2020 1208   LYMPHSABS 0.9 02/05/2021 0437   LYMPHSABS 2.6 08/07/2017  1424   MONOABS 0.6 02/05/2021 0437   EOSABS 0.2 02/05/2021 0437   EOSABS 0.2 08/07/2017 1424   BASOSABS 0.0 02/05/2021 0437   BASOSABS 0.0 08/07/2017 1424   CMP Latest Ref Rng & Units 02/05/2021 02/04/2021 02/03/2021  Glucose 70 - 99 mg/dL 153(H) 132(H) 91  BUN 6 - 20 mg/dL 63(H) 62(H) 55(H)  Creatinine 0.61 - 1.24 mg/dL 3.31(H) 3.57(H) 3.43(H)  Sodium 135 - 145 mmol/L 134(L) 134(L) 134(L)  Potassium 3.5 - 5.1 mmol/L 4.2 4.4 4.2  Chloride 98 - 111 mmol/L 101 101 102  CO2 22 - 32 mmol/L 24 23 24   Calcium 8.9 - 10.3 mg/dL 8.2(L) 8.0(L) 7.8(L)  Total Protein 6.5 - 8.1 g/dL 6.3(L) - 6.0(L)  Total Bilirubin 0.3 - 1.2 mg/dL 0.9 - 0.6  Alkaline Phos 38 - 126 U/L 165(H) - 136(H)  AST 15 - 41 U/L 14(L) - 27  ALT 0 - 44 U/L 18 - 24     Radiology Studies: NM Pulmonary Perfusion  Result Date: 02/03/2021 CLINICAL DATA:  Worsening shortness of breath. EXAM: NUCLEAR MEDICINE PERFUSION LUNG SCAN TECHNIQUE: Perfusion images were obtained in multiple projections after intravenous injection of radiopharmaceutical. Ventilation scans intentionally deferred if perfusion scan and chest x-ray adequate for interpretation during COVID 19 epidemic. RADIOPHARMACEUTICALS:  4.3 millicuries mCi WR-[id number] MAA IV COMPARISON:  None. FINDINGS: Predominantly homogeneous distribution of tracer activity is seen throughout both lungs. No focal segmental or subsegmental perfusion defects are identified. IMPRESSION: Normal nuclear medicine perfusion lung scan. Electronically Signed   By: Virgina Norfolk M.D.   On: 02/03/2021 19:11   DG CHEST PORT 1 VIEW  Result Date: 02/04/2021 CLINICAL DATA:  History of pneumonia. EXAM: PORTABLE CHEST 1 VIEW COMPARISON:  Chest radiograph 02/02/2021. FINDINGS: Monitoring leads overlie the patient. Stable cardiac and mediastinal contours. Slight interval increase in right-greater-than-left midlung consolidative opacities. No pleural effusion or pneumothorax. IMPRESSION: Interval increase in right  greater than left predominantly perihilar consolidation which may represent pneumonia in the appropriate clinical setting. Followup PA and lateral chest X-ray is recommended in 3-4 weeks following trial of antibiotic therapy to ensure resolution and exclude underlying malignancy. Electronically Signed   By: Lovey Newcomer M.D.   On: 02/04/2021 08:11     Scheduled Meds:  amLODipine  2.5 mg Oral Daily   aspirin EC  81 mg Oral Daily   atorvastatin  40 mg Oral Daily   Chlorhexidine Gluconate Cloth  6 each Topical Daily   escitalopram  5 mg Oral Daily   finasteride  5 mg Oral Daily   furosemide  60 mg Intravenous BID   heparin  5,000 Units Subcutaneous Q8H   hydrALAZINE  100 mg Oral TID   insulin aspart  0-9 Units Subcutaneous TID WC   lidocaine  1 patch Transdermal Q24H   mouth rinse  15 mL Mouth Rinse BID   melatonin  3 mg Oral QHS   metoprolol succinate  50 mg Oral QHS   pantoprazole  40 mg Oral Daily   polyethylene glycol  17 g Oral  Daily   Continuous Infusions:   LOS: 4 days   Time spent: [place], MD [facility] To contact the attending provider between 7A-7P or the covering provider during after hours 7P-7A, please log into the web site [email or web address] and access using universal [place] password for that web site. If you do not have the password, please call the hospital operator.  02/05/2021, 11:02 AM

## 2021-02-05 NOTE — Progress Notes (Signed)
Progress Note  Patient Name: Ryan Jenkins Date of Encounter: 02/05/2021  [facility] Cardiologist: None   Subjective   Feeling poorly.  Had increased oxygen requirements overnight.  Complains of back pain.  Non-productive cough.  Nose and throat are sore.  Inpatient Medications    Scheduled Meds:  amLODipine  2.5 mg Oral Daily   aspirin EC  81 mg Oral Daily   atorvastatin  40 mg Oral Daily   Chlorhexidine Gluconate Cloth  6 each Topical Daily   escitalopram  5 mg Oral Daily   finasteride  5 mg Oral Daily   furosemide  60 mg Intravenous BID   heparin  5,000 Units Subcutaneous Q8H   hydrALAZINE  100 mg Oral TID   insulin aspart  0-9 Units Subcutaneous TID WC   mouth rinse  15 mL Mouth Rinse BID   melatonin  3 mg Oral QHS   metoprolol succinate  50 mg Oral QHS   pantoprazole  40 mg Oral Daily   polyethylene glycol  17 g Oral Daily   Continuous Infusions:  PRN Meds: acetaminophen **OR** acetaminophen, albuterol, guaiFENesin-dextromethorphan, lip balm, ondansetron **OR** ondansetron (ZOFRAN) IV   Vital Signs    Vitals:   02/04/21 1420 02/04/21 2150 02/05/21 0015 02/05/21 0630  BP: 132/65 134/64  (!) 144/69  Pulse: 87 89  88  Resp: 17     Temp: 98.1 F (36.7 C) 98 F (36.7 C)  99 F (37.2 C)  TempSrc: Oral Oral  Oral  SpO2: 93% 93% 95% 90%  Weight:    98.7 kg  Height:        Intake/Output Summary (Last 24 hours) at 02/05/2021 0825 Last data filed at 02/05/2021 0700 Gross per 24 hour  Intake --  Output 1500 ml  Net -1500 ml   Last 3 Weights 02/05/2021 02/02/2021 02/02/2021  Weight (lbs) 217 lb 9.5 oz 220 lb 219 lb 12.8 oz  Weight (kg) 98.7 kg 99.791 kg 99.701 kg      Telemetry    Sinus rhythm - Personally Reviewed  ECG    N/a - Personally Reviewed  Physical Exam   GEN: No acute distress.   Neck: +JVP to mandible Cardiac: RRR, no murmurs, rubs, or gallops.  Respiratory: Rhonchi at R base. GI: Soft, nontender, non-distended  MS: No edema; No  deformity. Neuro:  Nonfocal  Psych: Normal affect   Labs    High Sensitivity Troponin:   Recent Labs  Lab 02/01/21 0916 02/01/21 1508 02/01/21 1644 02/03/21 1222 02/03/21 1355  TROPONINIHS 68* 65* 77* 59* 57*     Chemistry Recent Labs  Lab 02/02/21 0459 02/03/21 0425 02/04/21 0444 02/05/21 0437  NA 136 134* 134* 134*  K 4.0 4.2 4.4 4.2  CL 102 102 101 101  CO2 25 24 23 24   GLUCOSE 106* 91 132* 153*  BUN 44* 55* 62* 63*  CREATININE 2.19* 3.43* 3.57* 3.31*  CALCIUM 8.1* 7.8* 8.0* 8.2*  MG  --  2.1  --   --   PROT 5.5* 6.0*  --  6.3*  ALBUMIN 2.6* 2.5*  --  2.5*  AST 22 27  --  14*  ALT 22 24  --  18  ALKPHOS 115 136*  --  165*  BILITOT 0.4 0.6  --  0.9  GFRNONAA 34* 20* 19* 20*  ANIONGAP 9 8 10 9     Lipids No results for input(s): CHOL, TRIG, HDL, LABVLDL, LDLCALC, CHOLHDL in the last 168 hours.  Hematology Recent Labs  Lab 02/03/21 0425 02/03/21 0429 02/04/21 0754 02/05/21 0437  WBC 8.1  --  7.1 6.7  RBC 2.64* 2.66* 2.67* 2.77*  HGB 8.0*  --  8.2* 8.3*  HCT 25.1*  --  24.9* 26.1*  MCV 95.1  --  93.3 94.2  MCH 30.3  --  30.7 30.0  MCHC 31.9  --  32.9 31.8  RDW 13.1  --  13.1 12.8  PLT 280  --  322 359   Thyroid No results for input(s): TSH, FREET4 in the last 168 hours.  BNP Recent Labs  Lab 01/31/21 1929 02/01/21 0916 02/04/21 0754  BNP 645.8* 862.4* 803.1*    DDimer  Recent Labs  Lab 02/01/21 1644  DDIMER 1.26*     Radiology    NM Pulmonary Perfusion  Result Date: 02/03/2021 CLINICAL DATA:  Worsening shortness of breath. EXAM: NUCLEAR MEDICINE PERFUSION LUNG SCAN TECHNIQUE: Perfusion images were obtained in multiple projections after intravenous injection of radiopharmaceutical. Ventilation scans intentionally deferred if perfusion scan and chest x-ray adequate for interpretation during COVID 19 epidemic. RADIOPHARMACEUTICALS:  4.3 millicuries mCi AY-30Z MAA IV COMPARISON:  None. FINDINGS: Predominantly homogeneous distribution of tracer  activity is seen throughout both lungs. No focal segmental or subsegmental perfusion defects are identified. IMPRESSION: Normal nuclear medicine perfusion lung scan. Electronically Signed   By: Virgina Norfolk M.D.   On: 02/03/2021 19:11   DG CHEST PORT 1 VIEW  Result Date: 02/04/2021 CLINICAL DATA:  History of pneumonia. EXAM: PORTABLE CHEST 1 VIEW COMPARISON:  Chest radiograph 02/02/2021. FINDINGS: Monitoring leads overlie the patient. Stable cardiac and mediastinal contours. Slight interval increase in right-greater-than-left midlung consolidative opacities. No pleural effusion or pneumothorax. IMPRESSION: Interval increase in right greater than left predominantly perihilar consolidation which may represent pneumonia in the appropriate clinical setting. Followup PA and lateral chest X-ray is recommended in 3-4 weeks following trial of antibiotic therapy to ensure resolution and exclude underlying malignancy. Electronically Signed   By: Lovey Newcomer M.D.   On: 02/04/2021 08:11    Cardiac Studies   Echo 02/02/21: 1. Left ventricular ejection fraction, by estimation, is 60 to 65%. The  left ventricle has normal function. The left ventricle has no regional  wall motion abnormalities. There is mild left ventricular hypertrophy.  Left ventricular diastolic parameters  are consistent with Grade II diastolic dysfunction (pseudonormalization).  Elevated left atrial pressure.   2. Right ventricular systolic function is normal. The right ventricular  size is normal.   3. The mitral valve is normal in structure. Trivial mitral valve  regurgitation. No evidence of mitral stenosis.   4. The aortic valve is tricuspid. Aortic valve regurgitation is not  visualized. Aortic valve sclerosis is present, with no evidence of aortic  valve stenosis.   5. The inferior vena cava is normal in size with greater than 50%  respiratory variability, suggesting right atrial pressure of 3 mmHg.   Patient Profile     61  y.o. male with chronic diastolic heart failure, uncontrolled diabetes, hypertension, CKD 4, GERD, vitreous hemorrhage of the left eye, moderate CAD with hypoxic respiratory failure and acute on chronic diastolic heart failure and acute on chronic renal failure.  Assessment & Plan    #Acute on chronic diastolic heart failure: #Hypoxic respiratory failure: Renal function is worsened with attempted diuresis and his urine output has been poor.  Yesterday he was net -1.5L  and renal function is improving.  CXR with persistent infiltrates.  Volume status is difficult to assess.  His JVP  is elevated but IVC was not dilated on echo and he has no LE edema.  Previously required CVVHD for volume removal.  Given the cough and CXR findings, will add a respiratory viral panel.  With lack of leukocytosis and fever, bacterial pneumonia seems less likely.   # AKI superimposed on CKD 4: Chlorthalidone and his ACE inhibitor have been held due to worsening renal function.  Renal function worsened with increasing his diuretic dose yet he remains volume overloaded and urine output has been poor.  Lasix was reduced.    #CAD: #Elevated troponin: Cath in 2001 revealed 60% RCA disease.  Has had intermittent chest pain and initial EKG was concerning for ischemia.  High-sensitivity troponin has been mildly elevated and flat.  Given his chronic kidney disease recommend an attempt at medical therapy prior to proceeding with cardiac catheterization.  Aspirin reduced to 81 mg daily.  Continue metoprolol.  #Hypertension: Blood pressure well controlled.  Continue amlodipine, hydralazine, metoprolol  #Diabetes mellitus: Very poorly controlled.  Hemoglobin A1c 13.4%.  # Back pain:  Will order his home tramadol and add a licocaine patch.  Tylenol already ordered.  Hold home gabapentin 2/2 renal dysfunction.      For questions or updates, please contact [place] Please consult [email or web address] for contact info under         Signed, Skeet Latch, MD  02/05/2021, 8:25 AM

## 2021-02-05 NOTE — Consult Note (Signed)
Renal Service Consult Note [facility]  VENCENT HAUSCHILD 02/05/2021 Sol Blazing, MD Requesting Physician: Dr. Verlon Au  Reason for Consult: Renal failure HPI: The patient is a 61 y.o. 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 Lasix obtained from PCP was not helping.  H/o CHF. On RA at home. In ED BNP was high , CXR showed vasc congestion. Pt was admitted and started on Lasix at 40 mg bid IV.  Lisinopril and hygroton were stopped, norvasc was cut back.  CReat has risen from 2.8 on admit up to 3.5 yesterday and 3.4 today.  We are asked to see for renal failure.   Pt seen in room.  C/o sore throat, and still SOB, but the swelling in the legs is "a lot better".  They are doing a resp panel swab on him today it seems. Pt lives [place] w/ his brother. F/[facility] Dr Carolin Sicks at [facility].  No prod cough or fevers, no abd pain, no n/v/d, no rash or voiding issues. Has foley cath placed here.   ROS - denies CP, no joint pain, no HA, no blurry vision, no rash, no diarrhea, no nausea/ vomiting   Past Medical History  Past Medical History:  Diagnosis Date   Anemia    Anxiety    BPH (benign prostatic hyperplasia)    CAD (coronary artery disease)    Chronic diastolic CHF (congestive heart failure) (HCC)    Chronic kidney disease, stage IV (severe) ([place])    Depression    Diabetes mellitus with complication (HCC)    Diabetic neuropathy (HCC)    GERD (gastroesophageal reflux disease)    Hypertension    Retinopathy due to secondary diabetes ([place])    Sleep disturbances 11/25/2018   Vision changes 11/25/2018   Vitamin B12 deficiency    Vitreous hemorrhage (HCC)    Past Surgical History  Past Surgical History:  Procedure Laterality Date   CATARACT EXTRACTION Bilateral 2017   Family History  Family History  Problem Relation Age of Onset   Renal cancer Mother     Hypertension Mother    Pancreatic cancer Mother    Hypertension Sister    Stroke Sister    Leukemia Maternal Uncle    Sickle cell trait Maternal Aunt    Colon cancer Neg Hx    Esophageal cancer Neg Hx    Rectal cancer Neg Hx    Stomach cancer Neg Hx    Social History  reports that he has never smoked. He has never used smokeless tobacco. He reports that he does not drink alcohol and does not use drugs. Allergies  Allergies  Allergen Reactions   Claritin [Loratadine] Swelling    Joint swelling    Hydrochlorothiazide Other (See Comments)    Dizziness   Latex Hives   Lyrica [Pregabalin] Other (See Comments)    depression   Metformin And Related Other (See Comments)    GI   Home medications Prior to Admission medications   Medication Sig Start Date End Date Taking? Authorizing Provider  amLODipine (NORVASC) 10 MG tablet Take 1 tablet (10 mg total) by mouth daily. 07/20/20 02/01/21 Yes Charlott Rakes, MD  atorvastatin (LIPITOR) 40 MG tablet Take 1 tablet (40 mg total) by mouth daily. 07/20/20  Yes Newlin, Charlane Ferretti, MD  D-Mannose 350 MG CAPS Take 1,050 capsules by mouth in the morning and at bedtime.  Yes [provider]  escitalopram (LEXAPRO) 20 MG tablet Take 1 tablet (20 mg total) by mouth daily. 07/20/20  Yes Charlott Rakes, MD  finasteride (PROSCAR) 5 MG tablet Take 1 tablet (5 mg total) by mouth daily. 07/20/20  Yes Charlott Rakes, MD  hydrALAZINE (APRESOLINE) 100 MG tablet Take 1 tablet (100 mg total) by mouth 3 (three) times daily. 07/20/20  Yes Newlin, Charlane Ferretti, MD  insulin detemir (LEVEMIR) 100 UNIT/ML injection Inject 0.1 mLs (10 Units total) into the skin daily. Patient taking differently: Inject 40 Units into the skin at bedtime. 07/20/20  Yes Charlott Rakes, MD  lisinopril (ZESTRIL) 10 MG tablet Take 10 mg by mouth daily. 11/21/20  Yes [provider]  metoprolol succinate (TOPROL-XL) 100 MG 24 hr tablet Take 1.5 tablets (150 mg total) by mouth at bedtime. Take  with or immediately following a meal. 07/20/20  Yes Newlin, Enobong, MD  nortriptyline (PAMELOR) 25 MG capsule Take 1 capsule (25 mg total) by mouth at bedtime. 07/20/20  Yes Charlott Rakes, MD  pantoprazole (PROTONIX) 40 MG tablet Take 1 tablet (40 mg total) by mouth daily. 07/09/20 02/01/21 Yes Pahwani, Einar Grad, MD  traZODone (DESYREL) 50 MG tablet Take 3 tablets (150 mg total) by mouth at bedtime. Patient taking differently: Take 150 mg by mouth at bedtime as needed for sleep. 07/20/20  Yes Charlott Rakes, MD  acetaminophen (TYLENOL) 500 MG tablet Take 1,000 mg by mouth every 6 (six) hours as needed for mild pain or headache.    [provider]  Blood Glucose Monitoring Suppl (TRUE METRIX METER) w/Device KIT Use as directed 07/20/20   Ladell Pier, MD  Blood Pressure Monitor DEVI Use as directed to check home blood pressure 2-3 times a week 07/13/20   Camillia Herter, NP  cefdinir (OMNICEF) 300 MG capsule Take 1 capsule by mouth daily Patient not taking: Reported on 12/10/2020 08/26/20     chlorthalidone (HYGROTON) 25 MG tablet Take 0.5 tablets (12.5 mg total) by mouth every other day. Patient not taking: Reported on 12/10/2020 07/20/20   Charlott Rakes, MD  cloNIDine (CATAPRES - DOSED IN MG/24 HR) 0.1 mg/24hr patch Place 1 patch (0.1 mg total) onto the skin once a week. Patient not taking: Reported on 02/01/2021 07/20/20   Charlott Rakes, MD  cyclobenzaprine (FLEXERIL) 5 MG tablet Take 1 tablet (5 mg total) by mouth 2 (two) times daily as needed for muscle spasms (medication can cause drowsiness). Patient not taking: Reported on 12/10/2020 08/19/20   Charlott Rakes, MD  furosemide (LASIX) 40 MG tablet Take 1 tablet (40 mg total) by mouth daily as needed for fluid or edema. Patient not taking: Reported on 12/10/2020 08/04/20 12/10/20  Florencia Reasons, MD  gabapentin (NEURONTIN) 100 MG capsule Take 2 capsules (200 mg total) by mouth daily for 21 days. 12/10/20 12/31/20  Redwine, Madison A, PA-C  glucose  blood (TRUE METRIX BLOOD GLUCOSE TEST) test strip Use as instructed 07/20/20   Ladell Pier, MD  hydrALAZINE (APRESOLINE) 25 MG tablet Take 1 tablet (25 mg total) by mouth 3 (three) times daily. Patient not taking: Reported on 12/10/2020 07/20/20   Charlott Rakes, MD  Insulin Syringe-Needle U-100 (TRUEPLUS INSULIN SYRINGE) 31G X 5/16" 0.3 ML MISC Use to inject Levemir at bedtime. 07/20/20   Charlott Rakes, MD  lisinopril (ZESTRIL) 5 MG tablet Take 1 tablet by mouth daily Patient not taking: Reported on 12/10/2020 08/24/20     ondansetron (ZOFRAN) 4 MG tablet Take 1 tablet (4 mg total) by  mouth every 8 (eight) hours as needed for nausea or vomiting. Patient not taking: Reported on 12/10/2020 07/20/20   Charlott Rakes, MD  tadalafil (CIALIS) 5 MG tablet Take 1 tablet (5 mg total) by mouth daily. Patient not taking: Reported on 12/10/2020 07/20/20   Charlott Rakes, MD  tamsulosin (FLOMAX) 0.4 MG CAPS capsule Take 1 capsule (0.4 mg total) by mouth daily after breakfast. Patient not taking: Reported on 12/10/2020 07/20/20   Charlott Rakes, MD  TRUEplus Lancets 28G MISC Use as directed 07/20/20   Ladell Pier, MD     Vitals:   02/04/21 2150 02/05/21 0015 02/05/21 0630 02/05/21 1406  BP: 134/64  (!) 144/69 135/67  Pulse: 89  88 90  Resp:    18  Temp: 98 F (36.7 C)  99 F (37.2 C) 98.3 F (36.8 C)  TempSrc: Oral  Oral Oral  SpO2: 93% 95% 90% 91%  Weight:   98.7 kg   Height:       Exam Gen alert, no distress, nasal O2  No rash, cyanosis or gangrene Sclera anicteric, throat clear  No jvd or bruits, JVP around 8 Chest clear bilat to bases, no rales/ wheezing RRR no RG Abd soft ntnd no mass or ascites +bs GU foley in place, draining clear light yellowish urine MS no joint effusions or deformity Ext no LE or UE edema, trace-1+ hip edema, no wounds or ulcers Neuro is alert, Ox 3 , nf, no asterixis     Home meds include - norvasc 10, lipitor, lexaprol, proscar, hydralazine 100 tid,  insulin detemir, lisinopril 10 qd, metoprolol xl 150 hs, nortriptyline , protonix, trazodone prn, chlorthalidone 12.5 qd, clonidine patch 0.1 weekly, lasix 40 qd prn edema, gabapentin 200 x 2 daily x 21 days, flomax,  prns'/ supps/ vits     Date    Creat  eGFR   2017   1.10- 1.38     2019   0.82- 1.20   2020   1.47- 1.94   2021   1.81- 2.04   June- jul 2022 2.29- 2.69 26- 33 ml/min, stage IV    Nov 2022  2.94   Feb 01, 2020  2.81  24   Jan 18  2.80  25   Jan 19  2.19   Jan 20  3.43  20   Jan 21  3.57   Feb 05, 2021  3.31  20    UA 01/31/21 - prot 100, 0-5 rbc, 6-10 wbc, rare bacteria   CXR 01/31/21 - IMPRESSION: Mild perihilar airspace disease and vascular congestion. This may represent pneumonia or mild fluid overload.   CXR 1/18, 1/19, 1/21 - all show worsening of bilat perihilar edema/ congestion    BP's 110- 145/ 60- 80, no shock here   HR = NSR 85- 115, RR 18- 24 afeb    Nasal O2 >>  started 7 lpm > [place] 15 lpm > [facility] 15 lpm > [place] 15 lpm, this is current dosing.     UOP 1/18 3425 cc, 1/19 525, 1/20 850 cc, 1/21 850 cc, 1/22 1550 cc UOP   Net I/O = 1.7 L in and 7.2 L out = net 5.5 L negative    Wts show 103kg peak on admit and down to 99kg today (initial wt thrown out because usually was dragged from a prior admission)   Assessment/ Plan: AKI on CKD -  b/l creat of 2.3- 2.8 from mid - nov 2022, eGFR 24- 33 ml/min. Creat here  2.8 on admit and increased to 3.5 yest in setting of LE edema and SOB/ pulm edema by CXR, getting IV lasix for last 5 days since admission. Wt's are down 4-5kg and I/O are 5 L net negative.  Serial CXRs shows perihilar changes have worsened , or blurred out more on subsequent images. Question of infection/ viral syndrome, because still SOB and having URI sx's.  Resp panel ordered today. Is not on any abx. UA unremarkable. AKI likely due to diuresis. No acei/ ARB/ vanc IV or contrast. Creat down slightly today, agree w/ continued IV lasix 60 bid for now. No uremic  symptoms. Will get renal US and urine lytes. Will follow.  NSTEMI - EF 60-65% Acute CHF exacerbation - getting IV lasix as above, improving overall BPH - on flomax, proscar HTN - getting hydralazine and norvasc low dose, clonidine on hold. BP's good.       Kelly Splinter  MD 02/05/2021, 4:40 PM  Recent Labs  Lab 02/04/21 0754 02/05/21 0437  WBC 7.1 6.7  HGB 8.2* 8.3*   Recent Labs  Lab 02/04/21 0444 02/05/21 0437  K 4.4 4.2  BUN 62* 63*  CREATININE 3.57* 3.31*  CALCIUM 8.0* 8.2*

## 2021-02-06 ENCOUNTER — Inpatient Hospital Stay (HOSPITAL_COMMUNITY): Payer: Medicaid Other

## 2021-02-06 LAB — BASIC METABOLIC PANEL
Anion gap: 9 (ref 5–15)
BUN: 57 mg/dL — ABNORMAL HIGH (ref 6–20)
CO2: 25 mmol/L (ref 22–32)
Calcium: 8.2 mg/dL — ABNORMAL LOW (ref 8.9–10.3)
Chloride: 101 mmol/L (ref 98–111)
Creatinine, Ser: 2.88 mg/dL — ABNORMAL HIGH (ref 0.61–1.24)
GFR, Estimated: 24 mL/min — ABNORMAL LOW (ref >60)
Glucose, Bld: 175 mg/dL — ABNORMAL HIGH (ref 70–99)
Potassium: 4.1 mmol/L (ref 3.5–5.1)
Sodium: 135 mmol/L (ref 135–145)

## 2021-02-06 LAB — RESPIRATORY PANEL BY PCR

## 2021-02-06 LAB — GLUCOSE, CAPILLARY
Glucose-Capillary: 167 mg/dL — ABNORMAL HIGH (ref 70–99)
Glucose-Capillary: 155 mg/dL — ABNORMAL HIGH (ref 70–99)
Glucose-Capillary: 235 mg/dL — ABNORMAL HIGH (ref 70–99)
Glucose-Capillary: 191 mg/dL — ABNORMAL HIGH (ref 70–99)
Glucose-Capillary: 242 mg/dL — ABNORMAL HIGH (ref 70–99)

## 2021-02-06 MED ORDER — SODIUM CHLORIDE 0.9 % IV SOLN: INTRAVENOUS | Status: DC | PRN

## 2021-02-06 MED ORDER — SODIUM CHLORIDE (PF) 0.9 % IJ SOLN
INTRAMUSCULAR | Status: AC
  Filled 2021-02-06: qty 50

## 2021-02-06 MED ORDER — SODIUM CHLORIDE 0.9 % IV SOLN
2.0000 g | Freq: Two times a day (BID) | INTRAVENOUS | Status: DC
  Administered 2021-02-06 – 2021-02-08 (×5): 2 g via INTRAVENOUS
  Filled 2021-02-06 (×5): qty 2

## 2021-02-06 NOTE — Progress Notes (Signed)
PROGRESS NOTE   Ryan Jenkins  AUQ:[id number] DOB: 1960/11/12 DOA: 01/31/2021 PCP: Armanda Heritage, NP  Brief Narrative:   61 year old African-American male DM ty ii + Neuropathy htn ckd iv (baseline creatinine 2.5) Prior admission [place] 2021,-->CRRT CPPD BPH Anxiety Prior vitreous hemorrhage left eye 08/04/2019  presented to [facility] 01/31/2021 SOB DOE bilateral LL edema oliguria Work-up = BNP 645-Rx 60 Lasix at [facility]- found to have NSTEMI 2/2 demand ischemia CXR = pulmonary vascular congestion  Overnight 1/18 developed ?  SOB SPO2 85% on 7 L increased to 15 L sats 88-94% and still requiring NRB 15 L Also apparently dropped blood sugar 28  Subsequently aggressively diuresed prompting Cardiology and Nephrology input Found to have pneumonia on x-ray 1/23 antibiotics started Anticipate he will need a couple of days in the [place] and then follow-up with nephrology as an outpatient as I do not think he will need HD immediately  Hospital-Problem based course   hypoxic respiratory failure on admission presumably 2/2 above PE ruled out with negative VQ scan 1/20 Likely aspiration pneumonia present on admission As still having hypoxia, follow Respiratory viral panel ord 1/22 Surprisingly 1/23 2 view CXR shows middle lobe pneumonia Cefepime started and he feels much better with less back pain less winded and we are able to de-escalate his oxygen NSTEMI Cardio-renal syndrome type 1, HFpEF grade 2 diastolic dysfunction EF 60 to 65% this admission Acute CHF exacerbation Was on 30 L of oxygen on admit--I cut back to 13 L--sats stayed in 90's -7.8 liters, wght 102---> 95.9 Dropped Toprol XL dose 150-->50 Continue hydralazine 100 3 times daily holding clonidine amlodipine 10-->2.5, continue aspirin 325 Nutritionist consulted to assist with diet explanation AKI superimposed on CKD 4 (prior admission [place] 2021 temporarily on CRRT at that time) Nephrology consulted 1/21  Continue Lasix 60 mg IV twice daily and titrate Hold lisinopril 10, chlorthalidone 25, amlodipine cut back Chest back leg pains--? 2/2 Azotemia and muliple psychotropics? See above discussion Cautious use tramadol in setting azotemia Anxiety Cut back Lexapro to 20--increase in the next several days stop Pamelor 25, trazodone 150 Melatonin to continue Profound hypoglycemia on admit 2/2 not eating adequately--Received D50 x 2 Patient eats his largest meal in the morning sliding scale alone for now =sugars 1 91-2 35 eating 100% of meals BPH Resume Flomax 0.4, Proscar 5 Prior vitreous hemorrhage left eye 2021  DVT prophylaxis: Heparin Code Status: Full Family Communication: Called Sister Disposition:  Status is: Inpatient  Remains inpatient appropriate because:   Hypoglycemic hypoxic Might need HD   Consultants:  None at this time  Procedures: Echocardiogram 1/19 is pending  Antimicrobials: None   Subjective:  Overall much better Pain controlled-no n/v Now on only 8 liters, and is de-escalating  Objective: Vitals:   02/06/21 0927 02/06/21 1241 02/06/21 1450 02/06/21 1740  BP: (!) 151/71 (!) 141/71    Pulse: 89 87    Resp:  18    Temp:  98.3 F (36.8 C)    TempSrc:  Oral    SpO2: 94% 96% 95% 94%  Weight:      Height:        Intake/Output Summary (Last 24 hours) at 02/06/2021 1820 Last data filed at 02/06/2021 1614 Gross per 24 hour  Intake 774.2 ml  Output 3125 ml  Net -2350.8 ml    Filed Weights   02/02/21 0850 02/05/21 0630 02/06/21 0500  Weight: 99.8 kg 98.7 kg 95.9 kg    Examination:  EOMI NCAT no icterus  Neck veins less distended S1-S2 sinus Abdomen soft Slight rales posterolaterally Right side Less tremulous Power 5/5 otherwise  Data Reviewed: personally reviewed   CBC    Component Value Date/Time   WBC 6.7 02/05/2021 0437   RBC 2.77 (L) 02/05/2021 0437   HGB 8.3 (L) 02/05/2021 0437   HGB 10.9 (L) 07/13/2020 1208   HCT 26.1 (L)  02/05/2021 0437   HCT 34.6 (L) 07/13/2020 1208   PLT 359 02/05/2021 0437   PLT 395 07/13/2020 1208   MCV 94.2 02/05/2021 0437   MCV 94 07/13/2020 1208   MCH 30.0 02/05/2021 0437   MCHC 31.8 02/05/2021 0437   RDW 12.8 02/05/2021 0437   RDW 15.1 07/13/2020 1208   LYMPHSABS 0.9 02/05/2021 0437   LYMPHSABS 2.6 08/07/2017 1424   MONOABS 0.6 02/05/2021 0437   EOSABS 0.2 02/05/2021 0437   EOSABS 0.2 08/07/2017 1424   BASOSABS 0.0 02/05/2021 0437   BASOSABS 0.0 08/07/2017 1424   CMP Latest Ref Rng & Units 02/06/2021 02/05/2021 02/04/2021  Glucose 70 - 99 mg/dL 175(H) 153(H) 132(H)  BUN 6 - 20 mg/dL 57(H) 63(H) 62(H)  Creatinine 0.61 - 1.24 mg/dL 2.88(H) 3.31(H) 3.57(H)  Sodium 135 - 145 mmol/L 135 134(L) 134(L)  Potassium 3.5 - 5.1 mmol/L 4.1 4.2 4.4  Chloride 98 - 111 mmol/L 101 101 101  CO2 22 - 32 mmol/L 25 24 23   Calcium 8.9 - 10.3 mg/dL 8.2(L) 8.2(L) 8.0(L)  Total Protein 6.5 - 8.1 g/dL - 6.3(L) -  Total Bilirubin 0.3 - 1.2 mg/dL - 0.9 -  Alkaline Phos 38 - 126 U/L - 165(H) -  AST 15 - 41 U/L - 14(L) -  ALT 0 - 44 U/L - 18 -     Radiology Studies: DG Chest 2 View  Result Date: 02/06/2021 CLINICAL DATA:  Pneumonia EXAM: CHEST - 2 VIEW COMPARISON:  02/04/2021 FINDINGS: Right perihilar opacity has a less confluent appearance. There is increased patchy density the right upper lung and left lower lung. No pleural effusion. Similar cardiomediastinal contours. IMPRESSION: Left confluent appearance of right perihilar opacity. However, there is increased patchy density in the right upper and left lower lungs. Overall findings are suspicious for progression of pneumonia. Electronically Signed   By: Macy Mis M.D.   On: 02/06/2021 09:03   US RENAL  Result Date: 02/05/2021 CLINICAL DATA:  Acute on chronic renal failure EXAM: RENAL / URINARY TRACT ULTRASOUND COMPLETE COMPARISON:  None. FINDINGS: Right Kidney: Renal measurements: 11.2 x 5.5 x 5.5 cm = volume: 178 mL. Echogenicity within  normal limits. No mass or hydronephrosis visualized. Left Kidney: Renal measurements: 11.5 x 5.8 x 5.4 cm = volume: 189 mL. Echogenicity within normal limits. No mass or hydronephrosis visualized. Bladder: Decompressed by Foley catheter. Other: None. IMPRESSION: No acute abnormality detected.  No hydronephrosis. Electronically Signed   By: Delanna Ahmadi M.D.   On: 02/05/2021 20:15     Scheduled Meds:  amLODipine  2.5 mg Oral Daily   aspirin EC  81 mg Oral Daily   atorvastatin  40 mg Oral Daily   Chlorhexidine Gluconate Cloth  6 each Topical Daily   escitalopram  5 mg Oral Daily   finasteride  5 mg Oral Daily   furosemide  60 mg Intravenous BID   heparin  5,000 Units Subcutaneous Q8H   hydrALAZINE  100 mg Oral TID   insulin aspart  0-9 Units Subcutaneous TID WC   lidocaine  1 patch Transdermal Q24H   mouth rinse  15  mL Mouth Rinse BID   melatonin  3 mg Oral QHS   metoprolol succinate  50 mg Oral QHS   pantoprazole  40 mg Oral Daily   polyethylene glycol  17 g Oral Daily   sodium chloride (PF)       Continuous Infusions:  sodium chloride Stopped (02/06/21 1334)   ceFEPime (MAXIPIME) IV 2 g (02/06/21 1139)     LOS: 5 days   Time spent: 30  Nita Sells, MD [facility] To contact the attending provider between 7A-7P or the covering provider during after hours 7P-7A, please log into the web site [email or web address] and access using universal [place] password for that web site. If you do not have the password, please call the hospital operator.  02/06/2021, 6:20 PM

## 2021-02-06 NOTE — Progress Notes (Addendum)
[facility] Progress Note  Subjective: 2400 cc UOP yest, BP's good, creatinine down 2.8 this am.   Vitals:   02/05/21 0630 02/05/21 1406 02/06/21 0500 02/06/21 0542  BP: (!) 144/69 135/67  (!) 144/71  Pulse: 88 90  84  Resp:  18  16  Temp: 99 F (37.2 C) 98.3 F (36.8 C)  98.6 F (37 C)  TempSrc: Oral Oral  Oral  SpO2: 90% 91%  95%  Weight: 98.7 kg  95.9 kg   Height:        Exam: Gen alert, no distress, nasal O2  No jvd or bruits Chest clear bilat to bases RRR no RG Abd soft ntnd no mass or ascites +bs GU foley in place Ext no pretib or UE edema, trace-1+ hip edema Neuro is alert, Ox 3 , nf, no asterixis      Home meds include - norvasc 10, lipitor, lexaprol, proscar, hydralazine 100 tid, insulin detemir, lisinopril 10 qd, metoprolol xl 150 hs, nortriptyline , protonix, trazodone prn, chlorthalidone 12.5 qd, clonidine patch 0.1 weekly, lasix 40 qd prn edema, gabapentin 200 x 2 daily x 21 days, flomax,  prns'/ supps/ vits      Date                          Creat               eGFR   2017                          1.10- 1.38                       2019                          0.82- 1.20   2020                          1.47- 1.94   2021                          1.81- 2.04   June- jul 2022           2.29- 2.69        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 This may represent pneumonia or mild fluid overload.   CXR  1/18, 1/19, 1/21 - all show worsening of bilat perihilar edema/ congestion     Assessment/ Plan: AKI on CKD -  b/l creat of 2.3- 2.8 from June - nov 2022, eGFR 24- 33 ml/min. Creat here 2.8 on admit and increased to 3.5 yest in setting of LE edema and SOB/ pulm edema by CXR, getting IV lasix for last 5 days since admission. Wt's are down 4-5kg and I/O are 5 L net negative. Serial CXRs shows perihilar changes worsening, however LE edema is much better and wt's are down 6kg.  Resp panel pending. Still requiring high flow [place] O2 10L. Urine lytes c/w diuresis, UA unremarkable and renal US w/o obstruction. AKI likely due to decomp CHF. Creat down again today. Continue diuresis on IV lasix 60 bid. NSTEMI - EF 60-65% Acute CHF exacerbation - getting IV lasix as above, improving overall BPH - on flomax, proscar HTN - getting hydralazine and norvasc here, clonidine on hold. BP's wnl.           Ryan Jenkins 02/06/2021, 6:33 AM   Recent Labs  Lab 02/04/21 0754 02/05/21 0437 02/06/21 0423  K  --  4.2 4.1  BUN  --  63* 57*  CREATININE  --  3.31* 2.88*  CALCIUM  --  8.2* 8.2*  HGB 8.2* 8.3*  --    Inpatient medications:  amLODipine  2.5 mg Oral Daily   aspirin EC  81 mg Oral Daily   atorvastatin  40 mg Oral Daily   Chlorhexidine Gluconate Cloth  6 each Topical Daily   escitalopram  5 mg Oral Daily   finasteride  5 mg Oral Daily   furosemide  60 mg Intravenous BID   heparin  5,000 Units Subcutaneous Q8H   hydrALAZINE  100 mg Oral TID   insulin aspart  0-9 Units Subcutaneous TID WC   lidocaine  1 patch Transdermal Q24H   mouth rinse  15 mL Mouth Rinse BID   melatonin  3 mg Oral QHS   metoprolol succinate  50 mg Oral QHS   pantoprazole  40 mg Oral Daily   polyethylene glycol  17 g Oral Daily    acetaminophen **OR** acetaminophen, albuterol, guaiFENesin-dextromethorphan, lip balm, menthol-cetylpyridinium, ondansetron **OR** ondansetron (ZOFRAN) IV, sodium chloride, traMADol

## 2021-02-06 NOTE — Progress Notes (Signed)
Progress Note  Patient Name: Ryan Jenkins Date of Encounter: 02/06/2021  [facility] Cardiologist: None   Subjective   Feeling better.  Respiratory status is improving. Back pain is better.   Inpatient Medications    Scheduled Meds:  sodium chloride (PF)       amLODipine  2.5 mg Oral Daily   aspirin EC  81 mg Oral Daily   atorvastatin  40 mg Oral Daily   Chlorhexidine Gluconate Cloth  6 each Topical Daily   escitalopram  5 mg Oral Daily   finasteride  5 mg Oral Daily   furosemide  60 mg Intravenous BID   heparin  5,000 Units Subcutaneous Q8H   hydrALAZINE  100 mg Oral TID   insulin aspart  0-9 Units Subcutaneous TID WC   lidocaine  1 patch Transdermal Q24H   mouth rinse  15 mL Mouth Rinse BID   melatonin  3 mg Oral QHS   metoprolol succinate  50 mg Oral QHS   pantoprazole  40 mg Oral Daily   polyethylene glycol  17 g Oral Daily   Continuous Infusions:  PRN Meds: acetaminophen **OR** acetaminophen, albuterol, guaiFENesin-dextromethorphan, lip balm, menthol-cetylpyridinium, ondansetron **OR** ondansetron (ZOFRAN) IV, sodium chloride, traMADol   Vital Signs    Vitals:   02/05/21 0630 02/05/21 1406 02/06/21 0500 02/06/21 0542  BP: (!) 144/69 135/67  (!) 144/71  Pulse: 88 90  84  Resp:  18  16  Temp: 99 F (37.2 C) 98.3 F (36.8 C)  98.6 F (37 C)  TempSrc: Oral Oral  Oral  SpO2: 90% 91%  95%  Weight: 98.7 kg  95.9 kg   Height:        Intake/Output Summary (Last 24 hours) at 02/06/2021 0859 Last data filed at 02/06/2021 0224 Gross per 24 hour  Intake 120 ml  Output 2475 ml  Net -2355 ml   Last 3 Weights 02/06/2021 02/05/2021 02/02/2021  Weight (lbs) 211 lb 8 oz 217 lb 9.5 oz 220 lb  Weight (kg) 95.936 kg 98.7 kg 99.791 kg      Telemetry    Sinus rhythm - Personally Reviewed  ECG    N/a - Personally Reviewed  Physical Exam   GEN: No acute distress.   Neck: +JVP to mandible Cardiac: RRR, no murmurs, rubs, or gallops.  Respiratory: Rhonchi  at R base. GI: Soft, nontender, non-distended  MS: No edema; No deformity. Neuro:  Nonfocal  Psych: Normal affect   Labs    High Sensitivity Troponin:   Recent Labs  Lab 02/01/21 0916 02/01/21 1508 02/01/21 1644 02/03/21 1222 02/03/21 1355  TROPONINIHS 68* 65* 77* 59* 57*     Chemistry Recent Labs  Lab 02/02/21 0459 02/03/21 0425 02/04/21 0444 02/05/21 0437 02/06/21 0423  NA 136 134* 134* 134* 135  K 4.0 4.2 4.4 4.2 4.1  CL 102 102 101 101 101  CO2 25 24 23 24 25   GLUCOSE 106* 91 132* 153* 175*  BUN 44* 55* 62* 63* 57*  CREATININE 2.19* 3.43* 3.57* 3.31* 2.88*  CALCIUM 8.1* 7.8* 8.0* 8.2* 8.2*  MG  --  2.1  --   --   --   PROT 5.5* 6.0*  --  6.3*  --   ALBUMIN 2.6* 2.5*  --  2.5*  --   AST 22 27  --  14*  --   ALT 22 24  --  18  --   ALKPHOS 115 136*  --  165*  --  BILITOT 0.4 0.6  --  0.9  --   GFRNONAA 34* 20* 19* 20* 24*  ANIONGAP 9 8 10 9 9     Lipids No results for input(s): CHOL, TRIG, HDL, LABVLDL, LDLCALC, CHOLHDL in the last 168 hours.  Hematology Recent Labs  Lab 02/03/21 0425 02/03/21 0429 02/04/21 0754 02/05/21 0437  WBC 8.1  --  7.1 6.7  RBC 2.64* 2.66* 2.67* 2.77*  HGB 8.0*  --  8.2* 8.3*  HCT 25.1*  --  24.9* 26.1*  MCV 95.1  --  93.3 94.2  MCH 30.3  --  30.7 30.0  MCHC 31.9  --  32.9 31.8  RDW 13.1  --  13.1 12.8  PLT 280  --  322 359   Thyroid No results for input(s): TSH, FREET4 in the last 168 hours.  BNP Recent Labs  Lab 02/01/21 0916 02/04/21 0754 02/05/21 0831  BNP 862.4* 803.1* 863.4*    DDimer  Recent Labs  Lab 02/01/21 1644  DDIMER 1.26*     Radiology    US RENAL  Result Date: 02/05/2021 CLINICAL DATA:  Acute on chronic renal failure EXAM: RENAL / URINARY TRACT ULTRASOUND COMPLETE COMPARISON:  None. FINDINGS: Right Kidney: Renal measurements: 11.2 x 5.5 x 5.5 cm = volume: 178 mL. Echogenicity within normal limits. No mass or hydronephrosis visualized. Left Kidney: Renal measurements: 11.5 x 5.8 x 5.4 cm =  volume: 189 mL. Echogenicity within normal limits. No mass or hydronephrosis visualized. Bladder: Decompressed by Foley catheter. Other: None. IMPRESSION: No acute abnormality detected.  No hydronephrosis. Electronically Signed   By: Delanna Ahmadi M.D.   On: 02/05/2021 20:15    Cardiac Studies   Echo 02/02/21: 1. Left ventricular ejection fraction, by estimation, is 60 to 65%. The  left ventricle has normal function. The left ventricle has no regional  wall motion abnormalities. There is mild left ventricular hypertrophy.  Left ventricular diastolic parameters  are consistent with Grade II diastolic dysfunction (pseudonormalization).  Elevated left atrial pressure.   2. Right ventricular systolic function is normal. The right ventricular  size is normal.   3. The mitral valve is normal in structure. Trivial mitral valve  regurgitation. No evidence of mitral stenosis.   4. The aortic valve is tricuspid. Aortic valve regurgitation is not  visualized. Aortic valve sclerosis is present, with no evidence of aortic  valve stenosis.   5. The inferior vena cava is normal in size with greater than 50%  respiratory variability, suggesting right atrial pressure of 3 mmHg.   Patient Profile     61 y.o. male with chronic diastolic heart failure, uncontrolled diabetes, hypertension, CKD 4, GERD, vitreous hemorrhage of the left eye, moderate CAD with hypoxic respiratory failure and acute on chronic diastolic heart failure and acute on chronic renal failure.  Assessment & Plan    #Acute on chronic diastolic heart failure: #Hypoxic respiratory failure: Yesterday he was net -3L  and renal function continues to improve.  Weight is down 98.7kg-->95.9 kg today.  Oxygen down from 15L to 11L.  Respiratory viral panel was negative.  Continue IV lasix.  Appreciate nephrology input.  No indication for CVVHD at this time.   # AKI superimposed on CKD 4: Chlorthalidone and his ACE inhibitor have been held due to  worsening renal function.  Renal function worsened with increasing his diuretic dose but now stabilized to improving.   #CAD: #Elevated troponin: Cath in 2001 revealed 60% RCA disease.  Has had intermittent chest pain and initial EKG  was concerning for ischemia.  High-sensitivity troponin has been mildly elevated and flat.  Given his chronic kidney disease recommend an attempt at medical therapy prior to proceeding with cardiac catheterization.  Aspirin reduced to 81 mg daily.  Continue metoprolol.  #Hypertension: Blood pressure well controlled.  Continue amlodipine, hydralazine, metoprolol  # Diabetes mellitus: Very poorly controlled.  Hemoglobin A1c 13.4%.  Consider SGLT2 inhibitor once improved.   # Back pain:  Better with his home tramadol and a licocaine patch.  Tylenol already ordered.  Hold home gabapentin 2/2 renal dysfunction.      For questions or updates, please contact [place] Please consult [email or web address] for contact info under        Signed, Skeet Latch, MD  02/06/2021, 8:59 AM

## 2021-02-06 NOTE — Progress Notes (Signed)
PHARMACY NOTE -  Cefepime  Pharmacy has been assisting with dosing of cefepime for HAP. Dosage remains stable at 2g IV q12 hr and further renal adjustments per institutional Pharmacy antibiotic protocol  Pharmacy will sign off, following peripherally for culture results or dose adjustments. Please reconsult if a change in clinical status warrants re-evaluation of dosage.  Reuel Boom, PharmD, BCPS [phone] 02/06/2021, 9:51 AM

## 2021-02-07 ENCOUNTER — Inpatient Hospital Stay (HOSPITAL_COMMUNITY): Payer: Medicaid Other

## 2021-02-07 DIAGNOSIS — I2583 Coronary atherosclerosis due to lipid rich plaque: Secondary | ICD-10-CM

## 2021-02-07 DIAGNOSIS — E1142 Type 2 diabetes mellitus with diabetic polyneuropathy: Secondary | ICD-10-CM

## 2021-02-07 DIAGNOSIS — N179 Acute kidney failure, unspecified: Secondary | ICD-10-CM

## 2021-02-07 DIAGNOSIS — I10 Essential (primary) hypertension: Secondary | ICD-10-CM

## 2021-02-07 DIAGNOSIS — N184 Chronic kidney disease, stage 4 (severe): Secondary | ICD-10-CM

## 2021-02-07 DIAGNOSIS — I2510 Atherosclerotic heart disease of native coronary artery without angina pectoris: Secondary | ICD-10-CM

## 2021-02-07 DIAGNOSIS — I5033 Acute on chronic diastolic (congestive) heart failure: Secondary | ICD-10-CM

## 2021-02-07 LAB — CBC WITH DIFFERENTIAL/PLATELET
Abs Immature Granulocytes: 0.13 10*3/uL — ABNORMAL HIGH (ref 0.00–0.07)
Basophils Absolute: 0.1 10*3/uL (ref 0.0–0.1)
Basophils Relative: 1 %
Eosinophils Absolute: 0.2 10*3/uL (ref 0.0–0.5)
Eosinophils Relative: 3 %
HCT: 27.4 % — ABNORMAL LOW (ref 39.0–52.0)
Hemoglobin: 9 g/dL — ABNORMAL LOW (ref 13.0–17.0)
Immature Granulocytes: 2 %
Lymphocytes Relative: 17 %
Lymphs Abs: 1.3 10*3/uL (ref 0.7–4.0)
MCH: 30.5 pg (ref 26.0–34.0)
MCHC: 32.8 g/dL (ref 30.0–36.0)
MCV: 92.9 fL (ref 80.0–100.0)
Monocytes Absolute: 0.6 10*3/uL (ref 0.1–1.0)
Monocytes Relative: 8 %
Neutro Abs: 5.3 10*3/uL (ref 1.7–7.7)
Neutrophils Relative %: 69 %
Platelets: 469 10*3/uL — ABNORMAL HIGH (ref 150–400)
RBC: 2.95 MIL/uL — ABNORMAL LOW (ref 4.22–5.81)
RDW: 12.8 % (ref 11.5–15.5)
WBC: 7.7 10*3/uL (ref 4.0–10.5)
nRBC: 0 % (ref 0.0–0.2)

## 2021-02-07 LAB — COMPREHENSIVE METABOLIC PANEL
ALT: 18 U/L (ref 0–44)
AST: 16 U/L (ref 15–41)
Albumin: 2.5 g/dL — ABNORMAL LOW (ref 3.5–5.0)
Alkaline Phosphatase: 192 U/L — ABNORMAL HIGH (ref 38–126)
Anion gap: 9 (ref 5–15)
BUN: 50 mg/dL — ABNORMAL HIGH (ref 6–20)
CO2: 26 mmol/L (ref 22–32)
Calcium: 8.1 mg/dL — ABNORMAL LOW (ref 8.9–10.3)
Chloride: 102 mmol/L (ref 98–111)
Creatinine, Ser: 2.76 mg/dL — ABNORMAL HIGH (ref 0.61–1.24)
GFR, Estimated: 25 mL/min — ABNORMAL LOW (ref >60)
Glucose, Bld: 219 mg/dL — ABNORMAL HIGH (ref 70–99)
Potassium: 4.1 mmol/L (ref 3.5–5.1)
Sodium: 137 mmol/L (ref 135–145)
Total Bilirubin: 0.6 mg/dL (ref 0.3–1.2)
Total Protein: 6.8 g/dL (ref 6.5–8.1)

## 2021-02-07 LAB — GLUCOSE, CAPILLARY
Glucose-Capillary: 207 mg/dL — ABNORMAL HIGH (ref 70–99)
Glucose-Capillary: 205 mg/dL — ABNORMAL HIGH (ref 70–99)
Glucose-Capillary: 201 mg/dL — ABNORMAL HIGH (ref 70–99)
Glucose-Capillary: 239 mg/dL — ABNORMAL HIGH (ref 70–99)
Glucose-Capillary: 202 mg/dL — ABNORMAL HIGH (ref 70–99)

## 2021-02-07 MED ORDER — AMLODIPINE BESYLATE 5 MG PO TABS: 5.0000 mg | ORAL_TABLET | Freq: Every day | ORAL | Status: DC

## 2021-02-07 MED ORDER — AMLODIPINE BESYLATE 5 MG PO TABS
7.5000 mg | ORAL_TABLET | Freq: Once | ORAL | Status: AC
  Administered 2021-02-07: 15:00:00 7.5 mg via ORAL
  Filled 2021-02-07: qty 2

## 2021-02-07 MED ORDER — FUROSEMIDE 40 MG PO TABS: 40.0000 mg | ORAL_TABLET | Freq: Every day | ORAL | Status: DC

## 2021-02-07 MED ORDER — AMLODIPINE BESYLATE 10 MG PO TABS
10.0000 mg | ORAL_TABLET | Freq: Every day | ORAL | Status: DC
  Administered 2021-02-08: 10:00:00 10 mg via ORAL
  Filled 2021-02-07: qty 1

## 2021-02-07 MED ORDER — FUROSEMIDE 40 MG PO TABS
40.0000 mg | ORAL_TABLET | Freq: Two times a day (BID) | ORAL | Status: DC
  Administered 2021-02-07 – 2021-02-08 (×2): 40 mg via ORAL
  Filled 2021-02-07 (×2): qty 1

## 2021-02-07 NOTE — Assessment & Plan Note (Signed)
Glucose controlled - Continue aspart

## 2021-02-07 NOTE — Evaluation (Signed)
Physical Therapy Evaluation Patient Details Name: Ryan Jenkins MRN: [id number] DOB: 03-Oct-1960 Today's Date: 02/07/2021  History of Present Illness  patient is a 61 year old male who presented to the hosptial with SOB and BLE edema.  patient was found to have acute congestive heart failure, NSTEMI, aspiraton pneumonia and profound hypoglycemia. PMH: DM II with neuropathy, HTN, CKD,  Clinical Impression  Pt presents with dependencies in mobility secondary to the above diagnosis. Pt maintained O2 sats at 94-97% on RA with gait. Pt did demonstrate decreased activity tolerance and mobility. Recommend HHPT and a manual w/c for community access secondary to CHF. Pt will continue to benefit from acute PT to maximize mobility and independence for return home with HHPT services.     Recommendations for follow up therapy are one component of a multi-disciplinary discharge planning process, led by the attending physician.  Recommendations may be updated based on patient status, additional functional criteria and insurance authorization.  Follow Up Recommendations Home health PT    Assistance Recommended at Discharge Intermittent Supervision/Assistance  Patient can return home with the following  Assistance with cooking/housework;A little help with walking and/or transfers    Equipment Recommendations Wheelchair (measurements PT)  Recommendations for Other Services       Functional Status Assessment Patient has had a recent decline in their functional status and demonstrates the ability to make significant improvements in function in a reasonable and predictable amount of time.     Precautions / Restrictions Precautions Precautions: Fall Precaution Comments: monitor O2a nd HR Restrictions Weight Bearing Restrictions: No      Mobility  Bed Mobility               General bed mobility comments: Sitting in recliner    Transfers Overall transfer level: Needs assistance Equipment  used: Rolling walker (2 wheels) Transfers: Sit to/from Stand Sit to Stand: Min assist           General transfer comment: Cues to increase forward trunk flexion to assist with initial stand up    Ambulation/Gait Ambulation/Gait assistance: Min guard Gait Distance (Feet): 60 Feet Assistive device: Rolling walker (2 wheels) Gait Pattern/deviations: Step-through pattern, Decreased stride length Gait velocity: decreased     General Gait Details: ambulated on RA O2 sats 94-97%  Stairs            Wheelchair Mobility    Modified Rankin (Stroke Patients Only)       Balance Overall balance assessment: Mild deficits observed, not formally tested                                           Pertinent Vitals/Pain Pain Assessment Pain Assessment: 0-10 Pain Score: 3  Pain Location: peripheral neuropathy Pain Descriptors / Indicators: Discomfort Pain Intervention(s): Limited activity within patient's tolerance, Monitored during session    Home Living Family/patient expects to be discharged to:: Private residence Living Arrangements: Other relatives (brother) Available Help at Discharge: Family;Available 24 hours/day Type of Home: Apartment Home Access: Stairs to enter Entrance Stairs-Rails: Can reach both Entrance Stairs-Number of Steps: 19   Home Layout: One level Home Equipment: Conservation officer, nature (2 wheels);Rollator (4 wheels) Additional Comments: Mod indep with household mobility    Prior Function Prior Level of Function : Independent/Modified Independent                     Hand  Dominance   Dominant Hand: Right    Extremity/Trunk Assessment   Upper Extremity Assessment Upper Extremity Assessment: Defer to OT evaluation RUE Deficits / Details: WFL for ROM MMT not tested secondary to patient reporting h/o RTC with shots provided by ortho for shoulder and bilateral knees.    Lower Extremity Assessment Lower Extremity Assessment:  Overall WFL for tasks assessed    Cervical / Trunk Assessment Cervical / Trunk Assessment: Normal  Communication   Communication: No difficulties  Cognition Arousal/Alertness: Awake/alert Behavior During Therapy: WFL for tasks assessed/performed Overall Cognitive Status: Within Functional Limits for tasks assessed                                          General Comments      Exercises     Assessment/Plan    PT Assessment Patient needs continued PT services  PT Problem List Decreased activity tolerance;Impaired sensation;Decreased mobility;Cardiopulmonary status limiting activity;Decreased strength;Decreased balance       PT Treatment Interventions DME instruction;Functional mobility training;Balance training;Gait training;Therapeutic activities;Neuromuscular re-education;Stair training;Therapeutic exercise    PT Goals (Current goals can be found in the Care Plan section)  Acute Rehab PT Goals Patient Stated Goal: To get stronger and go home PT Goal Formulation: With patient Time For Goal Achievement: 02/20/21 Potential to Achieve Goals: Good    Frequency Min 3X/week     Co-evaluation               AM-PAC PT "6 Clicks" Mobility  Outcome Measure Help needed turning from your back to your side while in a flat bed without using bedrails?: A Little Help needed moving from lying on your back to sitting on the side of a flat bed without using bedrails?: A Little Help needed moving to and from a bed to a chair (including a wheelchair)?: A Little Help needed standing up from a chair using your arms (e.g., wheelchair or bedside chair)?: A Little Help needed to walk in hospital room?: A Little Help needed climbing 3-5 steps with a railing? : A Lot 6 Click Score: 17    End of Session Equipment Utilized During Treatment: Gait belt Activity Tolerance: Patient tolerated treatment well Patient left: in chair;with chair alarm set Nurse Communication:  Mobility status PT Visit Diagnosis: Other abnormalities of gait and mobility (R26.89);Muscle weakness (generalized) (M62.81)    Time: 1040-1108 PT Time Calculation (min) (ACUTE ONLY): 28 min   Charges:   PT Evaluation $PT Eval Moderate Complexity: 1 Mod PT Treatments $Gait Training: 8-22 mins        Lelon Mast 02/07/2021, 11:51 AM

## 2021-02-07 NOTE — Progress Notes (Signed)
[facility] Progress Note  Subjective: 3600 cc UOP yest, creat down slightly to 2.76, pt's breathing much better and is off of O2 this afternoon.   Vitals:   02/07/21 0949 02/07/21 1142 02/07/21 1207 02/07/21 1344  BP:    (!) 154/76  Pulse:  85  88  Resp:    18  Temp:    (!) 97.5 F (36.4 C)  TempSrc:    Oral  SpO2: 96% 94% 98% 95%  Weight:      Height:        Exam: Gen alert, no distress, nasal O2  No jvd or bruits Chest clear bilat to bases RRR no RG Abd soft ntnd no mass or ascites +bs GU foley in place Ext no pretib or UE edema, trace-1+ hip edema Neuro is alert, Ox 3 , nf, no asterixis      Home meds include - norvasc 10, lipitor, lexaprol, proscar, hydralazine 100 tid, insulin detemir, lisinopril 10 qd, metoprolol xl 150 hs, nortriptyline , protonix, trazodone prn, chlorthalidone 12.5 qd, clonidine patch 0.1 weekly, lasix 40 qd prn edema, gabapentin 200 x 2 daily x 21 days, flomax,  prns'/ supps/ vits      Date                          Creat               eGFR   2017                          1.10- 1.38                       2019                          0.82- 1.20   2020                          1.47- 1.94   2021                          1.81- 2.04   June- jul 2022           2.29- 2.69        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 This may represent pneumonia or mild fluid overload.   CXR 1/18, 1/19, 1/21 -  all show worsening of bilat perihilar edema/ congestion     Assessment/ Plan: AKI on CKD -  b/l creat of 2.3- 2.8 from June - nov 2022, eGFR 24- 33 ml/min. Creat here 2.8 on admit and increased to 3.5 yest in setting of LE edema and SOB/ pulm edema by CXR. Pt has diuresed very well on 40 IV lasix bid then 60 mg IV lasix bid. Urine lytes c/w diuresis, UA unremarkable and renal US w/o obstruction. AKI due to decomp CHF. On room air today. Creat down to baseline 2.7, peak creat here was 3.5. Will stop IV lasix and start po lasix at 40 bid to continue upon dc.  Pt already has f/u up w/ Dr Carolin Sicks on 2/08 at [facility]. Will sign off.  NSTEMI - EF 60-65% Acute CHF exacerbation - getting IV lasix as above, improving overall BPH - on flomax, proscar HTN - agree w/ cardiology rec's           Ryan Jenkins 02/07/2021, 3:21 PM   Recent Labs  Lab 02/05/21 0437 02/06/21 0423 02/07/21 0438  K 4.2 4.1 4.1  BUN 63* 57* 50*  CREATININE 3.31* 2.88* 2.76*  CALCIUM 8.2* 8.2* 8.1*  HGB 8.3*  --  9.0*    Inpatient medications:  [START ON 02/08/2021] amLODipine  10 mg Oral Daily   aspirin EC  81 mg Oral Daily   atorvastatin  40 mg Oral Daily   Chlorhexidine Gluconate Cloth  6 each Topical Daily   escitalopram  5 mg Oral Daily   finasteride  5 mg Oral Daily   furosemide  60 mg Intravenous BID   [START ON 02/08/2021] furosemide  40 mg Oral Daily   heparin  5,000 Units Subcutaneous Q8H   hydrALAZINE  100 mg Oral TID   insulin aspart  0-9 Units Subcutaneous TID WC   lidocaine  1 patch Transdermal Q24H   mouth rinse  15 mL Mouth Rinse BID   melatonin  3 mg Oral QHS   metoprolol succinate  50 mg Oral QHS   pantoprazole  40 mg Oral Daily   polyethylene glycol  17 g Oral Daily    sodium chloride Stopped (02/06/21 1334)   ceFEPime (MAXIPIME) IV 2 g (02/07/21 1147)   sodium chloride, acetaminophen **OR** acetaminophen, albuterol, guaiFENesin-dextromethorphan, lip balm, menthol-cetylpyridinium, ondansetron  **OR** ondansetron (ZOFRAN) IV, sodium chloride, traMADol

## 2021-02-07 NOTE — Assessment & Plan Note (Signed)
Improving with diuresis - Consult nephrology

## 2021-02-07 NOTE — Evaluation (Signed)
Occupational Therapy Evaluation Patient Details Name: Ryan Jenkins MRN: [id number] DOB: [birth year] Today's Date: 02/07/2021   History of Present Illness patient is a 61 year old male who presented to the hosptial with SOB and BLE edema.  patient was found to have acute congestive heart failure, NSTEMI, aspiraton pneumonia and profound hypoglycemia. PMH: DM II with neuropathy, HTN, CKD,   Clinical Impression   Patient is a 61 year old male who was admitted for above. Patient was previouly living with brother in 2nd story apartment with MI for ADLs with RW. Currently, patient is min A for ADLs with RW with increased time and noted fatigue. Patient was noted to have decreased activity tolerance, increased need for supplemental O2, decreased standing balance impacting participation in ADLs. Patient would continue to benefit from skilled OT services at this time while admitted and after d/c to address noted deficits in order to improve overall safety and independence in ADLs.       Recommendations for follow up therapy are one component of a multi-disciplinary discharge planning process, led by the attending physician.  Recommendations may be updated based on patient status, additional functional criteria and insurance authorization.   Follow Up Recommendations  Home health OT (pending progress in acute stay)    Assistance Recommended at Discharge Frequent or constant Supervision/Assistance  Patient can return home with the following Help with stairs or ramp for entrance;Direct supervision/assist for financial management;Direct supervision/assist for medications management;Assistance with cooking/housework;A little help with walking and/or transfers;A little help with bathing/dressing/bathroom    Functional Status Assessment  Patient has had a recent decline in their functional status and demonstrates the ability to make significant improvements in function in a reasonable and predictable  amount of time.  Equipment Recommendations  None recommended by OT    Recommendations for Other Services       Precautions / Restrictions Precautions Precautions: Fall Precaution Comments: monitor O2a nd HR Restrictions Weight Bearing Restrictions: No      Mobility Bed Mobility Overal bed mobility: Needs Assistance Bed Mobility: Supine to Sit     Supine to sit: Min assist, HOB elevated     General bed mobility comments: with increased time and cues for proper positioning.    Transfers                          Balance Overall balance assessment: Mild deficits observed, not formally tested                                         ADL either performed or assessed with clinical judgement   ADL Overall ADL's : Needs assistance/impaired Eating/Feeding: Modified independent;Sitting   Grooming: Wash/dry hands;Wash/dry face;Sitting   Upper Body Bathing: Set up;Sitting   Lower Body Bathing: Minimal assistance;Sit to/from stand;Sitting/lateral leans   Upper Body Dressing : Set up;Sitting   Lower Body Dressing: Minimal assistance;Sit to/from stand;Sitting/lateral leans   Toilet Transfer: Minimal assistance;Ambulation;Rolling walker (2 wheels) Toilet Transfer Details (indicate cue type and reason): patient was able to take few steps over to recliner in room with RW with cues for proper hand and foot placement for transitions. patient reported onset of dizziness upon sitting in recliner with O2 noted to be 89% on 3.5L after txfer. patient was able to deep breath O2 saturation back up to 93% on 3.5L within 30 seconds. Toileting- Water quality scientist  and Hygiene: Minimal assistance;Sit to/from stand       Functional mobility during ADLs: Minimal assistance;Rolling walker (2 wheels)       Vision Patient Visual Report: No change from baseline       Perception     Praxis      Pertinent Vitals/Pain Pain Assessment Pain Assessment:  Faces Faces Pain Scale: Hurts a little bit Pain Location: peripheral neuropathy Pain Descriptors / Indicators: Discomfort Pain Intervention(s): Monitored during session     Hand Dominance Right   Extremity/Trunk Assessment Upper Extremity Assessment Upper Extremity Assessment: RUE deficits/detail RUE Deficits / Details: WFL for ROM MMT not tested secondary to patient reporting h/o RTC with shots provided by ortho for shoulder and bilateral knees.   Lower Extremity Assessment Lower Extremity Assessment: Defer to PT evaluation   Cervical / Trunk Assessment Cervical / Trunk Assessment: Normal   Communication Communication Communication: No difficulties   Cognition Arousal/Alertness: Awake/alert Behavior During Therapy: WFL for tasks assessed/performed Overall Cognitive Status: Within Functional Limits for tasks assessed                                       General Comments       Exercises     Shoulder Instructions      Home Living Family/patient expects to be discharged to:: Private residence Living Arrangements: Other relatives (brother) Available Help at Discharge: Family;Available 24 hours/day Type of Home: Apartment Home Access: Stairs to enter Entrance Stairs-Number of Steps: 19 Entrance Stairs-Rails: Can reach both Home Layout: One level     Bathroom Shower/Tub: Teacher, early years/pre: Standard     Home Equipment: Conservation officer, nature (2 wheels);BSC/3in1;Tub bench          Prior Functioning/Environment Prior Level of Function : Independent/Modified Independent                        OT Problem List: Decreased activity tolerance;Decreased knowledge of use of DME or AE;Decreased safety awareness      OT Treatment/Interventions: Self-care/ADL training;Therapeutic exercise;Neuromuscular education;Energy conservation;DME and/or AE instruction;Therapeutic activities;Patient/family education;Balance training    OT  Goals(Current goals can be found in the care plan section) Acute Rehab OT Goals Patient Stated Goal: to get back to apartment OT Goal Formulation: With patient Time For Goal Achievement: 02/21/21 Potential to Achieve Goals: Good  OT Frequency: Min 2X/week    Co-evaluation              AM-PAC OT "6 Clicks" Daily Activity     Outcome Measure Help from another person eating meals?: None Help from another person taking care of personal grooming?: A Little Help from another person toileting, which includes using toliet, bedpan, or urinal?: A Little Help from another person bathing (including washing, rinsing, drying)?: A Lot Help from another person to put on and taking off regular upper body clothing?: A Little Help from another person to put on and taking off regular lower body clothing?: A Lot 6 Click Score: 17   End of Session Equipment Utilized During Treatment: Gait belt;Rolling walker (2 wheels) Nurse Communication: Mobility status  Activity Tolerance: Patient tolerated treatment well Patient left: in chair;with call bell/phone within reach;with chair alarm set  OT Visit Diagnosis: Unsteadiness on feet (R26.81)                Time: 0938-1829 OT Time Calculation (min): 44 min  Charges:  OT General Charges $OT Visit: 1 Visit OT Evaluation $OT Eval Moderate Complexity: 1 Mod OT Treatments $Self Care/Home Management : 23-37 mins  Jackelyn Poling OTR/L, MS Acute Rehabilitation Department Office# [phone] Pager# [phone]   Marcellina Millin 02/07/2021, 10:17 AM

## 2021-02-07 NOTE — Progress Notes (Addendum)
Progress Note  Patient Name: Ryan Jenkins Date of Encounter: 02/07/2021  Primary Cardiologist: [name], [place] much better. Breathing improved. No chest pain. Visibly looks much better than last week. In good spirits sitting up in chair.  Inpatient Medications    Scheduled Meds:  amLODipine  2.5 mg Oral Daily   aspirin EC  81 mg Oral Daily   atorvastatin  40 mg Oral Daily   Chlorhexidine Gluconate Cloth  6 each Topical Daily   escitalopram  5 mg Oral Daily   finasteride  5 mg Oral Daily   furosemide  60 mg Intravenous BID   heparin  5,000 Units Subcutaneous Q8H   hydrALAZINE  100 mg Oral TID   insulin aspart  0-9 Units Subcutaneous TID WC   lidocaine  1 patch Transdermal Q24H   mouth rinse  15 mL Mouth Rinse BID   melatonin  3 mg Oral QHS   metoprolol succinate  50 mg Oral QHS   pantoprazole  40 mg Oral Daily   polyethylene glycol  17 g Oral Daily   Continuous Infusions:  sodium chloride Stopped (02/06/21 1334)   ceFEPime (MAXIPIME) IV 2 g (02/06/21 2127)   PRN Meds: sodium chloride, acetaminophen **OR** acetaminophen, albuterol, guaiFENesin-dextromethorphan, lip balm, menthol-cetylpyridinium, ondansetron **OR** ondansetron (ZOFRAN) IV, sodium chloride, traMADol   Vital Signs    Vitals:   02/07/21 0437 02/07/21 0500 02/07/21 0730 02/07/21 0853  BP: (!) 143/73   (!) 150/78  Pulse: 81     Resp: 20   16  Temp: 97.9 F (36.6 C)   (!) 97.5 F (36.4 C)  TempSrc: Oral   Oral  SpO2: 94%  99% 98%  Weight:  94.2 kg    Height:        Intake/Output Summary (Last 24 hours) at 02/07/2021 0937 Last data filed at 02/06/2021 2055 Gross per 24 hour  Intake 774.2 ml  Output 2450 ml  Net -1675.8 ml   Last 3 Weights 02/07/2021 02/06/2021 02/05/2021  Weight (lbs) 207 lb 11.2 oz 211 lb 8 oz 217 lb 9.5 oz  Weight (kg) 94.212 kg 95.936 kg 98.7 kg     Telemetry    NSR - Personally Reviewed Physical Exam   GEN: No acute distress.  HEENT:  Normocephalic, atraumatic, sclera non-icteric. Neck: No JVD or bruits. Cardiac: RRR no murmurs, rubs, or gallops.  Respiratory: Mildly diminished BS bilaterally. No wheezing or rhonchi. Breathing is unlabored. GI: Soft, nontender, non-distended, BS +x 4. MS: no deformity. Extremities: No clubbing or cyanosis. No edema. Distal pedal pulses are 2+ and equal bilaterally. Neuro:  AAOx3. Follows commands. Psych:  Responds to questions appropriately with a normal affect.  Labs    High Sensitivity Troponin:   Recent Labs  Lab 02/01/21 0916 02/01/21 1508 02/01/21 1644 02/03/21 1222 02/03/21 1355  TROPONINIHS 68* 65* 77* 59* 57*      Cardiac EnzymesNo results for input(s): TROPONINI in the last 168 hours. No results for input(s): TROPIPOC in the last 168 hours.   Chemistry Recent Labs  Lab 02/03/21 0425 02/04/21 0444 02/05/21 0437 02/06/21 0423 02/07/21 0438  NA 134*   < > 134* 135 137  K 4.2   < > 4.2 4.1 4.1  CL 102   < > 101 101 102  CO2 24   < > 24 25 26   GLUCOSE 91   < > 153* 175* 219*  BUN 55*   < > 63* 57* 50*  CREATININE  3.43*   < > 3.31* 2.88* 2.76*  CALCIUM 7.8*   < > 8.2* 8.2* 8.1*  PROT 6.0*  --  6.3*  --  6.8  ALBUMIN 2.5*  --  2.5*  --  2.5*  AST 27  --  14*  --  16  ALT 24  --  18  --  18  ALKPHOS 136*  --  165*  --  192*  BILITOT 0.6  --  0.9  --  0.6  GFRNONAA 20*   < > 20* 24* 25*  ANIONGAP 8   < > 9 9 9    < > = values in this interval not displayed.     Hematology Recent Labs  Lab 02/04/21 0754 02/05/21 0437 02/07/21 0438  WBC 7.1 6.7 7.7  RBC 2.67* 2.77* 2.95*  HGB 8.2* 8.3* 9.0*  HCT 24.9* 26.1* 27.4*  MCV 93.3 94.2 92.9  MCH 30.7 30.0 30.5  MCHC 32.9 31.8 32.8  RDW 13.1 12.8 12.8  PLT 322 359 469*    BNP Recent Labs  Lab 02/01/21 0916 02/04/21 0754 02/05/21 0831  BNP 862.4* 803.1* 863.4*     DDimer  Recent Labs  Lab 02/01/21 1644  DDIMER 1.26*     Radiology    DG Chest 2 View  Result Date: 02/06/2021 CLINICAL DATA:   Pneumonia EXAM: CHEST - 2 VIEW COMPARISON:  02/04/2021 FINDINGS: Right perihilar opacity has a less confluent appearance. There is increased patchy density the right upper lung and left lower lung. No pleural effusion. Similar cardiomediastinal contours. IMPRESSION: Left confluent appearance of right perihilar opacity. However, there is increased patchy density in the right upper and left lower lungs. Overall findings are suspicious for progression of pneumonia. Electronically Signed   By: Macy Mis M.D.   On: 02/06/2021 09:03   US RENAL  Result Date: 02/05/2021 CLINICAL DATA:  Acute on chronic renal failure EXAM: RENAL / URINARY TRACT ULTRASOUND COMPLETE COMPARISON:  None. FINDINGS: Right Kidney: Renal measurements: 11.2 x 5.5 x 5.5 cm = volume: 178 mL. Echogenicity within normal limits. No mass or hydronephrosis visualized. Left Kidney: Renal measurements: 11.5 x 5.8 x 5.4 cm = volume: 189 mL. Echogenicity within normal limits. No mass or hydronephrosis visualized. Bladder: Decompressed by Foley catheter. Other: None. IMPRESSION: No acute abnormality detected.  No hydronephrosis. Electronically Signed   By: Delanna Ahmadi M.D.   On: 02/05/2021 20:15   DG CHEST PORT 1 VIEW  Result Date: 02/07/2021 CLINICAL DATA:  Pneumonia.  Follow-up exam. EXAM: PORTABLE CHEST 1 VIEW COMPARISON:  12/07/2021 FINDINGS: Persistent airspace disease in the right lung particularly in the right perihilar region. Aeration at the left lung base has slightly improved. Heart size is stable. Trachea is midline. Negative for a pneumothorax. IMPRESSION: 1. Improved aeration at the left lung base. 2. No significant change in the parenchymal disease / airspace disease in the right lung. Electronically Signed   By: Markus Daft M.D.   On: 02/07/2021 08:50    Cardiac Studies   2d echo 02/02/21  1. Left ventricular ejection fraction, by estimation, is 60 to 65%. The  left ventricle has normal function. The left ventricle has no  regional  wall motion abnormalities. There is mild left ventricular hypertrophy.  Left ventricular diastolic parameters  are consistent with Grade II diastolic dysfunction (pseudonormalization).  Elevated left atrial pressure.   2. Right ventricular systolic function is normal. The right ventricular  size is normal.   3. The mitral valve is normal in  structure. Trivial mitral valve  regurgitation. No evidence of mitral stenosis.   4. The aortic valve is tricuspid. Aortic valve regurgitation is not  visualized. Aortic valve sclerosis is present, with no evidence of aortic  valve stenosis.   5. The inferior vena cava is normal in size with greater than 50%  respiratory variability, suggesting right atrial pressure of 3 mmHg.   Patient Profile     61 y.o. male with chronic diatolic CHF, CAD, uncontrolled diabetes with polyneuropathy, longstanding HTN, CKD stage IV, poor ambulatory status, GERD, prior vitreous hemorrhage of left eye 2021, BPH. Has been challenging to discern what the patient was taking prior to admission, has received care at variable institutions. Presented to [facility] ED with LE edema, episodic paroxysms of chest pain, SOB, oliguria and dry cough for 1 weeks' time. Troponins low/flat. Treated with diuresis but continued to have hypoxia. VQ negative for PE. Repeat CXR concerning for PNA, felt likely aspiration. Nephrology following for AKI on CKD.  Assessment & Plan   1. Acute hypoxic respiratory failure, suspected aspiration PNA with acute on chronic diastolic CHF  - started on diuretics on admission - 2V CXR 02/06/21 more clearly showed left confluent appearance of right perihilar opacity with increased patchy density in RUL/LLL suspicious for PNA - IM team feels he likely had aspiration PNA present on admission -> started on abx with improvement in oxygenation, requiring 3.5L at this time (down from 11-12 HFNC) - RVP negative - remains on IV Lasix 60mg  BID at this time - Cr slowly  improving, good UOP yesterday, net -9.1L thus far, weight 226->207  - will review further rx with MD - at discharge will need concrete plan for follow-up as the patient is at risk for disjointed continuity of care/medication confusion due to receiving care at multiple institutions   2. AKI superimposed on CKD stage IV - may be multifactorial in setting of known renal disease, +/- cardiorenal state, diuretic use - previously required RRT during similar admission in [place] - holding lisinopril, chlorthalidone - peak Cr 3.57, improving to 2.76 today - nephrology following   3. CAD (60% RCA 2021), episodic chest pain, low/flat chronic troponemia, mixed features - initial EKG concerning for ischemia, but follow-up tracings improved and troponin remained relatively flat  - echo 02/02/21 with normal EF and no RWMA - continue ASA, BB, statin - given CKD, medical therapy advised prior to proceeding with cath due to risk of progressive renal failure - some of CP could be due to PNA as well  4. HTN - BP 150s on amlodipine 2.5mg , Lasix 60mg  IV BID, hydralazine 100mg  TID, Toprol 50mg  daily - consider titration of amlodipine to 5mg  daily (cut back earlier this admission)  5. DM - poorly controlled, A1C 13.4% - SGLT2i on hold in setting of renal issues - per medicine team   For questions or updates, please contact [place] Please consult [email or web address] for contact info under Cardiology/STEMI.  Signed, Charlie Pitter, PA-C 02/07/2021, 9:37 AM

## 2021-02-07 NOTE — Care Management (Signed)
°  °  Durable Medical Equipment  (From admission, onward)           Start     Ordered   02/07/21 1302  For home use only DME standard manual wheelchair with seat cushion  Once       Comments: Patient suffers from congestive heart failure which impairs their ability to perform daily activities like bathing, dressing, feeding, grooming, and toileting in the home.  A cane or walker will not resolve issue with performing activities of daily living. A wheelchair will allow patient to safely perform daily activities. Patient can safely propel the wheelchair in the home or has a caregiver who can provide assistance. Length of need 6 months . Accessories: elevating leg rests (ELRs), wheel locks, extensions and anti-tippers.   02/07/21 1301

## 2021-02-07 NOTE — Assessment & Plan Note (Addendum)
Blood pressure slightly elevated - Continue amlodipine, furosemide, metoprolol -Stop chlorthalidone - Plan to transition lisinopril to losartan tomorrow and discharge

## 2021-02-07 NOTE — Hospital Course (Signed)
Mr. Million is a 61 y.o. M with CKD IV, hx AKI requiring temporary HD, CAD never PCI, dCHF, DM, and HTN who presented with 1 week progressive LE bilateral swelling, chest tightness and cough.  In the ER, BNP elevated, CXR with interstitial edema.  Admitted on Lasix.  1/18: Admitted  1/20: Cardiology consulted for CHF and persistent hypoxia 1/22: Nephrology consulted for worsening renal function

## 2021-02-07 NOTE — Assessment & Plan Note (Signed)
At baseline patient uses no oxygen.  He is here he is required up to 10 L of high flow nasal cannula.  He is now weaned off with diuresis.

## 2021-02-07 NOTE — Assessment & Plan Note (Signed)
Net -2 L yesterday, 9.1 L on admission.  Echo this this admission shows normal EF, grade 2 DD -Continue Furosemide 40 mg IV twice a day  -K supplement -Strict I/Os, daily weights, telemetry  -Daily monitoring renal function -COnsult Cardiology   -Continue metoprolol

## 2021-02-07 NOTE — TOC Transition Note (Signed)
Transition of Care [facility]) - CM/SW Discharge Note   Patient Details  Name: Ryan Jenkins MRN: [id number] Date of Birth: Oct 18, 1960  Transition of Care [facility]) CM/SW Contact:  Dessa Phi, RN Phone Number: 02/07/2021, 2:42 PM   Clinical Narrative: Attempted to get [facility] agency to accept-None able to accept:No-[facility]. Patient/MD updated agree to otpt PT-referral sent. [facility] to deliver w/c to rm prior d/c. No further CM needs.      Final next level of care: OP Rehab Barriers to Discharge: No Barriers Identified   Patient Goals and CMS Choice Patient states their goals for this hospitalization and ongoing recovery are:: home CMS Medicare.gov Compare Post Acute Care list provided to:: Patient Choice offered to / list presented to : Patient  Discharge Placement                       Discharge Plan and Services   Discharge Planning Services: CM Consult Post Acute Care Choice: Durable Medical Equipment          DME Arranged: Wheelchair manual DME Agency: [facility] Date DME Agency Contacted: 02/07/21 Time DME Agency Contacted: [phone] Representative spoke with at DME Agency: [place] ([place]) Interventions     Readmission Risk Interventions No flowsheet data found.

## 2021-02-07 NOTE — TOC Initial Note (Signed)
Transition of Care [facility]) - Initial/Assessment Note    Patient Details  Name: Ryan Jenkins MRN: [id number] Date of Birth: April 13, 1960  Transition of Care [facility]) CM/SW Contact:    Dessa Phi, RN Phone Number: 02/07/2021, 12:44 PM  Clinical Narrative: PT recc HHC-informed patient if unable to get [facility] agency to accept-will set up otpt PT-patient in agreement. Recc w/c-[facility] to deliver to rm prior d/c-await home dme order, & narrative-MD notified.On 02 will monitor if qualifies w/documented sats, & order-[facility] following. Patient has own transport home.                 Expected Discharge Plan: [place] Barriers to Discharge: Continued Medical Work up   Patient Goals and CMS Choice Patient states their goals for this hospitalization and ongoing recovery are:: home CMS Medicare.gov Compare Post Acute Care list provided to:: Patient Choice offered to / list presented to : Patient  Expected Discharge Plan and Services Expected Discharge Plan: [place]   Discharge Planning Services: CM Consult   Living arrangements for the past 2 months: Single Family Home                                      Prior Living Arrangements/Services Living arrangements for the past 2 months: Single Family Home Lives with:: Self Patient language and need for interpreter reviewed:: Yes Do you feel safe going back to the place where you live?: Yes      Need for Family Participation in Patient Care: No (Comment) Care giver support system in place?: Yes (comment)   Criminal Activity/Legal Involvement Pertinent to Current Situation/Hospitalization: No - Comment as needed  Activities of Daily Living Home Assistive Devices/Equipment: Walker (specify type), Bedside commode/3-in-1, Shower chair without back, Raised toilet seat with rails, Other (Comment) (utilizing BSC to elevate toilet seat) ADL Screening (condition at time of admission) Patient's  cognitive ability adequate to safely complete daily activities?: Yes Is the patient deaf or have difficulty hearing?: No Does the patient have difficulty seeing, even when wearing glasses/contacts?: No Does the patient have difficulty concentrating, remembering, or making decisions?: No Patient able to express need for assistance with ADLs?: Yes Does the patient have difficulty dressing or bathing?: Yes Independently performs ADLs?: No Communication: Independent Dressing (OT): Needs assistance Is this a change from baseline?: Pre-admission baseline Grooming: Needs assistance Is this a change from baseline?: Pre-admission baseline Feeding: Independent Bathing: Needs assistance Is this a change from baseline?: Pre-admission baseline Toileting: Independent In/Out Bed: Independent Walks in Home: Independent with device (comment) (uses walker) Does the patient have difficulty walking or climbing stairs?: Yes Weakness of Legs: Both Weakness of Arms/Hands: Both  Permission Sought/Granted Permission sought to share information with : Case Manager Permission granted to share information with : Yes, Verbal Permission Granted  Share Information with NAME: Case Manager           Emotional Assessment              Admission diagnosis:  Acute congestive heart failure ([place]) [I50.9] Acute respiratory failure with hypoxia ([place]) [J96.01] Acute on chronic congestive heart failure, unspecified heart failure type ([place]) [I50.9] Patient Active Problem List   Diagnosis Date Noted   Acute renal failure superimposed on stage 4 chronic kidney disease ([place])    Acute respiratory failure with hypoxia ([place]) 02/01/2021   Acute congestive heart failure ([place]) 01/31/2021  Acute CHF (congestive heart failure) ([place]) 08/03/2020   CKD (chronic kidney disease) stage 4, GFR 15-29 ml/min (HCC) 07/07/2020   Chest pain 07/07/2020   Acute on chronic diastolic CHF (congestive heart failure) ([place]) 07/07/2020    Erectile dysfunction associated with type 2 diabetes mellitus ([place]) 08/07/2019   Hives 08/07/2019   Vitreous hemorrhage of left eye ([place]) 07/22/2019   Elevated troponin 07/22/2019   Vision loss of left eye 07/22/2019   History of medication noncompliance 04/02/2019   Gastric ulcer without hemorrhage or perforation 12/25/2018   CKD (chronic kidney disease), stage III ([place]) 12/25/2018   Gastroesophageal reflux disease without esophagitis 09/04/2018   Diabetic retinopathy of both eyes associated with type 2 diabetes mellitus ([place]) 01/03/2018   Intermittent diarrhea 09/27/2017   Gastroparesis 09/27/2017   Macroalbuminuric diabetic nephropathy ([place]) 06/25/2017   History of falling 06/25/2017   Hyperlipidemia 03/21/2017   Vitamin B 12 deficiency 03/21/2017   Uncontrolled type 2 diabetes mellitus with peripheral neuropathy 02/05/2017   Essential hypertension 02/05/2017   Depression 02/05/2017   Unintended weight loss 02/05/2017   Gait disturbance 02/05/2017   Pronation deformity of both feet 05/11/2014   Diabetic neuropathy, type II diabetes mellitus ([place]) 05/11/2014   Metatarsal deformity 05/11/2014   PCP:  Armanda Heritage, NP Pharmacy:   [facility] [place], [place] - [phone] AT [address] [place] [address] Phone: [phone] Fax: [phone]     Social Determinants of Health (SDOH) Interventions    Readmission Risk Interventions No flowsheet data found.

## 2021-02-07 NOTE — Assessment & Plan Note (Signed)
Continue aspirin and atorvastatin. ?

## 2021-02-07 NOTE — Progress Notes (Signed)
°  Progress Note   Patient: Ryan Jenkins:[id number] DOB: 06/23/1960 DOA: 01/31/2021     6 DOS: the patient was seen and examined on 02/07/2021   Brief hospital course: Mr. Malecha is a 61 y.o. M with CKD IV, hx AKI requiring temporary HD, CAD never PCI, dCHF, DM, and HTN who presented with 1 week progressive LE bilateral swelling, chest tightness and cough.  In the ER, BNP elevated, CXR with interstitial edema.  Admitted on Lasix.  1/18: Admitted  1/20: Cardiology consulted for CHF and persistent hypoxia 1/22: Nephrology consulted for worsening renal function   Assessment and Plan * Acute on chronic congestive heart failure ([place]) Net -2 L yesterday, 9.1 L on admission.  Echo this this admission shows normal EF, grade 2 DD -Continue Furosemide 40 mg IV twice a day  -K supplement -Strict I/Os, daily weights, telemetry  -Daily monitoring renal function -COnsult Cardiology   -Continue metoprolol  Coronary artery disease - Continue aspirin and atorvastatin  Acute renal failure superimposed on stage 4 chronic kidney disease ([place]) Improving with diuresis - Consult nephrology  Acute respiratory failure with hypoxia ([place])- (present on admission) At baseline patient uses no oxygen.  He is here he is required up to 10 L of high flow nasal cannula.  He is now weaned off with diuresis.  CKD (chronic kidney disease) stage 4, GFR 15-29 ml/min (HCC)- (present on admission)    Essential hypertension- (present on admission) Blood pressure slightly elevated - Continue amlodipine, furosemide, metoprolol -Stop chlorthalidone - Plan to transition lisinopril to losartan tomorrow and discharge  Type 2 diabetes mellitus with peripheral neuropathy ([place])- (present on admission) Glucose controlled - Continue aspart      Subjective: Sitting up chair, no acute distress, swelling is resolved, mild orthopnea, no more dyspnea on exertion.  Objective Vital signs were reviewed and  unremarkable.  Mild hypertension. Adult male, sitting up in recliner, interactive, no acute distress, attention normal, affect pleasant, judgment insight appear normal.  Heart rate regular, JVP not visible sitting upright in the chair, no rales bilaterally, no wheezing, and respiratory effort normal, no lower extremity edema.  Data Reviewed: Discussed with cardiology and nephrology Patient metabolic panel notable for creatinine of 2.7.  Chest x-ray personally reviewed, shows resolving edema.  Ferritin reduced.  CBC unremarkable.     Disposition: Status is: Inpatient  Remains inpatient appropriate because: Ongoing diuresis for his severe episode of congestive heart failure and respiratory failure.  Likely transition to oral diuretics and discharge tomorrow             Author: Edwin Dada, MD 02/07/2021 6:22 PM  For on call review [email or web address].

## 2021-02-08 LAB — TSH: TSH: 1.672 u[IU]/mL (ref 0.350–4.500)

## 2021-02-08 LAB — BASIC METABOLIC PANEL
Anion gap: 7 (ref 5–15)
BUN: 49 mg/dL — ABNORMAL HIGH (ref 6–20)
CO2: 26 mmol/L (ref 22–32)
Calcium: 8 mg/dL — ABNORMAL LOW (ref 8.9–10.3)
Chloride: 104 mmol/L (ref 98–111)
Creatinine, Ser: 2.73 mg/dL — ABNORMAL HIGH (ref 0.61–1.24)
GFR, Estimated: 26 mL/min — ABNORMAL LOW (ref >60)
Glucose, Bld: 230 mg/dL — ABNORMAL HIGH (ref 70–99)
Potassium: 4 mmol/L (ref 3.5–5.1)
Sodium: 137 mmol/L (ref 135–145)

## 2021-02-08 LAB — GLUCOSE, CAPILLARY
Glucose-Capillary: 191 mg/dL — ABNORMAL HIGH (ref 70–99)
Glucose-Capillary: 214 mg/dL — ABNORMAL HIGH (ref 70–99)
Glucose-Capillary: 207 mg/dL — ABNORMAL HIGH (ref 70–99)

## 2021-02-08 MED ORDER — METOPROLOL SUCCINATE ER 50 MG PO TB24: 50.0000 mg | ORAL_TABLET | Freq: Every evening | ORAL | 6 refills | Status: DC

## 2021-02-08 MED ORDER — ATORVASTATIN CALCIUM 40 MG PO TABS: 40.0000 mg | ORAL_TABLET | Freq: Every day | ORAL | 6 refills | Status: DC

## 2021-02-08 MED ORDER — ASPIRIN 81 MG PO TBEC: 81.0000 mg | DELAYED_RELEASE_TABLET | Freq: Every day | ORAL | 11 refills | Status: DC

## 2021-02-08 MED ORDER — AMLODIPINE BESYLATE 10 MG PO TABS: 10.0000 mg | ORAL_TABLET | Freq: Every day | ORAL | 6 refills | Status: DC

## 2021-02-08 MED ORDER — POTASSIUM CHLORIDE CRYS ER 10 MEQ PO TBCR: 10.0000 meq | EXTENDED_RELEASE_TABLET | Freq: Every day | ORAL | 3 refills | Status: DC

## 2021-02-08 MED ORDER — HYDRALAZINE HCL 100 MG PO TABS: 100.0000 mg | ORAL_TABLET | Freq: Three times a day (TID) | ORAL | 6 refills | Status: DC

## 2021-02-08 MED ORDER — PANTOPRAZOLE SODIUM 40 MG PO TBEC: 40.0000 mg | DELAYED_RELEASE_TABLET | Freq: Every day | ORAL | 6 refills | Status: DC

## 2021-02-08 MED ORDER — FUROSEMIDE 40 MG PO TABS: 40.0000 mg | ORAL_TABLET | Freq: Two times a day (BID) | ORAL | 6 refills | Status: DC

## 2021-02-08 NOTE — TOC Transition Note (Signed)
Transition of Care [facility]) - CM/SW Discharge Note   Patient Details  Name: Ryan Jenkins MRN: [id number] Date of Birth: 1960-08-18  Transition of Care [facility]) CM/SW Contact:  Dessa Phi, RN Phone Number: 02/08/2021, 10:03 AM   Clinical Narrative: No further CM needs. See prior note.     Final next level of care: OP Rehab Barriers to Discharge: No Barriers Identified   Patient Goals and CMS Choice Patient states their goals for this hospitalization and ongoing recovery are:: home CMS Medicare.gov Compare Post Acute Care list provided to:: Patient Choice offered to / list presented to : Patient  Discharge Placement                       Discharge Plan and Services   Discharge Planning Services: CM Consult Post Acute Care Choice: Durable Medical Equipment          DME Arranged: Wheelchair manual DME Agency: [facility] Date DME Agency Contacted: 02/07/21 Time DME Agency Contacted: [phone] Representative spoke with at DME Agency: [place] ([place]) Interventions     Readmission Risk Interventions No flowsheet data found.

## 2021-02-08 NOTE — Progress Notes (Signed)
Pt discharge instructions given by nurse and MD.  Pt brother present for instructions with MD.  Pt left with wheelchair to take home. Pt belonging returned except wallet reported missing.  Laundry contacted, safety zone completed.  Pt taken to front entrance in his wheelchair.

## 2021-02-08 NOTE — Discharge Summary (Signed)
Physician Discharge Summary   Patient: Ryan Jenkins MRN: [id number] DOB: 1960/07/05  Admit date:     01/31/2021  Discharge date: 02/08/21  Discharge Physician: Edwin Dada   PCP: Armanda Heritage, NP   Recommendations at discharge:  Follow up with PCP Armanda Heritage in 1 week Armanda Heritage: Please check BMP in 1 week on new furosemide dose and new BP meds, discharge Cr 2.7 Armanda Heritage: Please review glucose log and resume Levemir at low dose given renal function if needed Follow up with Dr. Jeri Cos Kidney in 2 weeks Follow up with [name], Cardiology in 2-4 weeks       Discharge Diagnoses Principal Problem:   Acute on chronic congestive heart failure [facility]) Active Problems:   Type 2 diabetes mellitus with peripheral neuropathy ([place])   Essential hypertension   CKD (chronic kidney disease) stage 4, GFR 15-29 ml/min (HCC)   Acute respiratory failure with hypoxia ([place])   Acute renal failure superimposed on stage 4 chronic kidney disease ([place])   Coronary artery disease         Hospital Course   Ryan Jenkins is a 61 y.o. M with CKD IV, hx AKI requiring temporary HD, CAD never PCI, dCHF, DM, and HTN who presented with 1 week progressive LE bilateral swelling, chest tightness and cough.  In the ER, BNP elevated, CXR with interstitial edema.  Admitted on Lasix.  1/18: Admitted  1/20: Cardiology consulted for CHF and persistent hypoxia 1/22: Nephrology consulted for worsening renal function      * Acute on chronic congestive heart failure ([place]) Admitted with leg swelling, cough, orthopnea, and CXR showing edema.    Echo this this admission shows normal EF, grade 2 DD.  Treated with high dose IV Lasix and renal function improved.  Diuresed 10L and weaned off O2.  Discharged on Lasix 40 PO BID, hydralazine, metoprolol.  Needs Cardiology and Nephrology follow up.    Acute respiratory failure with hypoxia ([place])- (present on admission) At baseline  patient uses no oxygen.  Here he required up to 15 L of high flow nasal cannula due to CHF.  Weaned off with diuresis and ambulated without desaturation or dyspnea.  Coronary artery disease  Acute renal failure superimposed on stage 4 chronic kidney disease CKD (chronic kidney disease) stage 4, GFR 15-29 ml/min    Cr up to 3.5 mg/dL, improved to 2.7 mg/dL with diuresis.  Stabilized prior to discharge.  Essential hypertension- (present on admission)  Type 2 diabetes mellitus with peripheral neuropathy ([place])- (present on admission)  Possible community acquired pneumonia Opacities on CXR were thought to potentially be pneumonia.  Treated with 3 days cefepime.  No fever or WBC at any time, and hypoxia improved with diuresis.  Likely 3 days was sufficient if this truly was infection, although I am somewhat doubtful.            Pain control - [employer] Controlled Substance Reporting System database was reviewed.    Consultants: Cardiology. Nephrology Procedures performed: Echo  Disposition: Home Diet recommendation: Cardiac diet  DISCHARGE MEDICATION: Allergies as of 02/08/2021       Reactions   Claritin [loratadine] Swelling   Joint swelling   Hydrochlorothiazide Other (See Comments)   Dizziness   Latex Hives   Lyrica [pregabalin] Other (See Comments)   depression   Metformin And Related Other (See Comments)   GI        Medication List     STOP taking these medications  cefdinir 300 MG capsule Commonly known as: OMNICEF   chlorthalidone 25 MG tablet Commonly known as: HYGROTON   cloNIDine 0.1 mg/24hr patch Commonly known as: CATAPRES - Dosed in mg/24 hr   cyclobenzaprine 5 MG tablet Commonly known as: FLEXERIL   Levemir 100 UNIT/ML injection Generic drug: insulin detemir   lisinopril 10 MG tablet Commonly known as: ZESTRIL   lisinopril 5 MG tablet Commonly known as: ZESTRIL   ondansetron 4 MG tablet Commonly known as: ZOFRAN   tadalafil 5  MG tablet Commonly known as: CIALIS   tamsulosin 0.4 MG Caps capsule Commonly known as: FLOMAX       TAKE these medications    acetaminophen 500 MG tablet Commonly known as: TYLENOL Take 1,000 mg by mouth every 6 (six) hours as needed for mild pain or headache.   amLODipine 10 MG tablet Commonly known as: NORVASC Take 1 tablet (10 mg total) by mouth daily.   aspirin 81 MG EC tablet Take 1 tablet (81 mg total) by mouth daily. Swallow whole. Start taking on: February 09, 2021   atorvastatin 40 MG tablet Commonly known as: LIPITOR Take 1 tablet (40 mg total) by mouth daily.   Blood Pressure Monitor Devi Use as directed to check home blood pressure 2-3 times a week   D-Mannose 350 MG Caps Take 1,050 capsules by mouth in the morning and at bedtime.   escitalopram 20 MG tablet Commonly known as: LEXAPRO Take 1 tablet (20 mg total) by mouth daily.   finasteride 5 MG tablet Commonly known as: PROSCAR Take 1 tablet (5 mg total) by mouth daily.   furosemide 40 MG tablet Commonly known as: Lasix Take 1 tablet (40 mg total) by mouth 2 (two) times daily. What changed:  when to take this reasons to take this   gabapentin 100 MG capsule Commonly known as: Neurontin Take 2 capsules (200 mg total) by mouth daily for 21 days.   hydrALAZINE 100 MG tablet Commonly known as: APRESOLINE Take 1 tablet (100 mg total) by mouth 3 (three) times daily. What changed: Another medication with the same name was removed. Continue taking this medication, and follow the directions you see here.   metoprolol succinate 50 MG 24 hr tablet Commonly known as: TOPROL-XL Take 1 tablet (50 mg total) by mouth at bedtime. Take with or immediately following a meal. What changed:  medication strength how much to take   nortriptyline 25 MG capsule Commonly known as: PAMELOR Take 1 capsule (25 mg total) by mouth at bedtime.   pantoprazole 40 MG tablet Commonly known as: PROTONIX Take 1 tablet (40  mg total) by mouth daily.   potassium chloride 10 MEQ tablet Commonly known as: KLOR-CON M Take 1 tablet (10 mEq total) by mouth daily.   traZODone 50 MG tablet Commonly known as: DESYREL Take 3 tablets (150 mg total) by mouth at bedtime. What changed:  when to take this reasons to take this   True Metrix Blood Glucose Test test strip Generic drug: glucose blood Use as instructed   True Metrix Meter w/Device Kit Use as directed   TRUEplus Insulin Syringe 31G X 5/16" 0.3 ML Misc Generic drug: Insulin Syringe-Needle U-100 Use to inject Levemir at bedtime.   TRUEplus Lancets 28G Misc Use as directed               Durable Medical Equipment  (From admission, onward)           Start     Ordered  02/07/21 1302  For home use only DME standard manual wheelchair with seat cushion  Once       Comments: Patient suffers from congestive heart failure which impairs their ability to perform daily activities like bathing, dressing, feeding, grooming, and toileting in the home.  A cane or walker will not resolve issue with performing activities of daily living. A wheelchair will allow patient to safely perform daily activities. Patient can safely propel the wheelchair in the home or has a caregiver who can provide assistance. Length of need 6 months . Accessories: elevating leg rests (ELRs), wheel locks, extensions and anti-tippers.   02/07/21 1301            Follow-up Information     [facility] Follow up.   Specialty: Rehabilitation Why: they will call you to make appt. Contact information: [address] [phone] mc [address] [place] [phone]        Armanda Heritage, NP. Schedule an appointment as soon as possible for a visit in 1 week(s).   Specialty: Nurse Practitioner Contact information: [place] [phone]         Valene Bors, MD. Schedule an  appointment as soon as possible for a visit in 2 week(s).   Specialty: Cardiology Contact information: [address] [phone] [phone]         Rosita Fire, MD. Go to.   Specialties: Nephrology, Internal Medicine Why: Feb 8 at [facility] information: [place] [phone]                Discharge Instructions     ([place]) Call MD:  Anytime you have any of the following symptoms: 1) 3 pound weight gain in 24 hours or 5 pounds in 1 week 2) shortness of breath, with or without a dry hacking cough 3) swelling in the hands, feet or stomach 4) if you have to sleep on extra pillows at night in order to breathe.   Complete by: As directed    Avoid straining   Complete by: As directed    Diet - low sodium heart healthy   Complete by: As directed    Discharge instructions   Complete by: As directed    From Dr. Loleta Books: You were admitted for swelling and trouble breathing.  Here, we found that this was from congestive heart failure flaring up.  Take the following medicines for your heart: Take furosemide/Lasix 40 mg twice daily Take it before breakfast and before supper, it is a diuretic and will also help lower blood pressure Take furosemide with potassium  Take metoprolol 50 mg daily (this slows and protects the heart, and lowers blood pressure) This is lower dose than you were previously taking  Take hydralazine 100 mg three times daily This will lower blood pressure  Take amlodipine 10 mg once daily This will lower blood pressure  Take aspirin 81 mg daily and atorvastatin 40 mg nightly Aspirin protect the heart and atorvastatin is a cholesterol medicine   STOP chlorthalidone, clonidine and lisinopril These are some of your old blood pressure medicines  For your diabetes/blood sugars: For now, STOP levemir Check your blood sugar daily and bring to your PCP  and ask them about restarting Levemir insulin   Go see your primary care in 1 week Have them check your kidney function and potassium level  Go see Dr.  Badal, your cardiologist in 2-4 weeks Call his office for an appointment. Bring this medication list with you  Go see Dr. Carolin Sicks, the kidney specialist on Feb 8 at your follow up appintment   Heart Failure patients record your daily weight using the same scale at the same time of day   Complete by: As directed    Increase activity slowly   Complete by: As directed    STOP any activity that causes chest pain, shortness of breath, dizziness, sweating, or exessive weakness   Complete by: As directed        Discharge Exam: Filed Weights   02/05/21 0630 02/06/21 0500 02/07/21 0500  Weight: 98.7 kg 95.9 kg 94.2 kg   General: Pt is alert, awake, not in acute distress Cardiovascular: RRR, nl S1-S2, no murmurs appreciated.   No LE edema.   Respiratory: Normal respiratory rate and rhythm.  CTAB without rales or wheezes. Abdominal: Abdomen soft and non-tender.  No distension or HSM.   Neuro/Psych: Strength symmetric in upper and lower extremities.  Judgment and insight appear normal.   Condition at discharge: good  The results of significant diagnostics from this hospitalization (including imaging, microbiology, ancillary and laboratory) are listed below for reference.   Imaging Studies: DG Chest 2 View  Result Date: 02/06/2021 CLINICAL DATA:  Pneumonia EXAM: CHEST - 2 VIEW COMPARISON:  02/04/2021 FINDINGS: Right perihilar opacity has a less confluent appearance. There is increased patchy density the right upper lung and left lower lung. No pleural effusion. Similar cardiomediastinal contours. IMPRESSION: Left confluent appearance of right perihilar opacity. However, there is increased patchy density in the right upper and left lower lungs. Overall findings are suspicious for progression of pneumonia. Electronically Signed   By: Macy Mis M.D.   On: 02/06/2021 09:03   DG Chest 2 View  Result Date: 01/31/2021 CLINICAL DATA:  Short of breath.  Leg edema EXAM: CHEST - 2 VIEW COMPARISON:  12/10/2020 FINDINGS: Heart size normal. Question mild vascular congestion. Mild perihilar airspace disease has developed since the prior study. No pleural effusion. IMPRESSION: Mild perihilar airspace disease and vascular congestion. This may represent pneumonia or mild fluid overload. Electronically Signed   By: Franchot Gallo M.D.   On: 01/31/2021 19:42   NM Pulmonary Perfusion  Result Date: 02/03/2021 CLINICAL DATA:  Worsening shortness of breath. EXAM: NUCLEAR MEDICINE PERFUSION LUNG SCAN TECHNIQUE: Perfusion images were obtained in multiple projections after intravenous injection of radiopharmaceutical. Ventilation scans intentionally deferred if perfusion scan and chest x-ray adequate for interpretation during COVID 19 epidemic. RADIOPHARMACEUTICALS:  4.3 millicuries mCi AT-55D MAA IV COMPARISON:  None. FINDINGS: Predominantly homogeneous distribution of tracer activity is seen throughout both lungs. No focal segmental or subsegmental perfusion defects are identified. IMPRESSION: Normal nuclear medicine perfusion lung scan. Electronically Signed   By: Virgina Norfolk M.D.   On: 02/03/2021 19:11   US RENAL  Result Date: 02/05/2021 CLINICAL DATA:  Acute on chronic renal failure EXAM: RENAL / URINARY TRACT ULTRASOUND COMPLETE COMPARISON:  None. FINDINGS: Right Kidney: Renal measurements: 11.2 x 5.5 x 5.5 cm = volume: 178 mL. Echogenicity within normal limits. No mass or hydronephrosis visualized. Left Kidney: Renal measurements: 11.5 x 5.8 x 5.4 cm = volume: 189 mL. Echogenicity within normal limits. No mass or hydronephrosis visualized. Bladder: Decompressed by Foley catheter. Other: None. IMPRESSION: No acute abnormality detected.  No hydronephrosis. Electronically Signed   By: Delanna Ahmadi M.D.   On: 02/05/2021 20:15   DG CHEST PORT 1  VIEW  Result Date: 02/07/2021 CLINICAL DATA:  Pneumonia.  Follow-up exam. EXAM: PORTABLE CHEST 1 VIEW COMPARISON:  12/07/2021 FINDINGS: Persistent airspace disease in the right lung particularly in the right perihilar region. Aeration at the left lung base has slightly improved. Heart size is stable. Trachea is midline. Negative for a pneumothorax. IMPRESSION: 1. Improved aeration at the left lung base. 2. No significant change in the parenchymal disease / airspace disease in the right lung. Electronically Signed   By: Markus Daft M.D.   On: 02/07/2021 08:50   DG CHEST PORT 1 VIEW  Result Date: 02/04/2021 CLINICAL DATA:  History of pneumonia. EXAM: PORTABLE CHEST 1 VIEW COMPARISON:  Chest radiograph 02/02/2021. FINDINGS: Monitoring leads overlie the patient. Stable cardiac and mediastinal contours. Slight interval increase in right-greater-than-left midlung consolidative opacities. No pleural effusion or pneumothorax. IMPRESSION: Interval increase in right greater than left predominantly perihilar consolidation which may represent pneumonia in the appropriate clinical setting. Followup PA and lateral chest X-ray is recommended in 3-4 weeks following trial of antibiotic therapy to ensure resolution and exclude underlying malignancy. Electronically Signed   By: Lovey Newcomer M.D.   On: 02/04/2021 08:11   DG CHEST PORT 1 VIEW  Result Date: 02/02/2021 CLINICAL DATA:  Acute respiratory failure with hypoxia. EXAM: PORTABLE CHEST 1 VIEW COMPARISON:  02/01/2021 FINDINGS: Borderline cardiomegaly. Interstitial and alveolar pulmonary edema with perihilar manifestation, right more than left. Small bilateral pleural effusions. IMPRESSION: Congestive heart failure pattern, worsened over the last several days. Perihilar edema with enlarging effusions. Electronically Signed   By: Nelson Chimes M.D.   On: 02/02/2021 08:26   DG CHEST PORT 1 VIEW  Result Date: 02/01/2021 CLINICAL DATA:  Respiratory compromise. EXAM: PORTABLE  CHEST 1 VIEW COMPARISON:  01/31/2021. FINDINGS: The heart size and mediastinal contours are within normal limits. Perihilar airspace disease is noted bilaterally and increased from the prior exam. No effusion or pneumothorax. No acute osseous abnormality. IMPRESSION: Interval worsening of perihilar airspace disease in the lungs bilaterally, possible edema or infiltrate. Electronically Signed   By: Brett Fairy M.D.   On: 02/01/2021 21:13   ECHOCARDIOGRAM COMPLETE  Result Date: 02/02/2021    ECHOCARDIOGRAM REPORT   Patient Name:   Ryan Jenkins Date of Exam: 02/02/2021 Medical Rec #:  [id number]         Height:       74.0 in Accession #:    [id number]        Weight:       219.8 lb Date of Birth:  [birth year]         BSA:          2.262 m Patient Age:    30 years          BP:           127/62 mmHg Patient Gender: M                 HR:           75 bpm. Exam Location:  Inpatient Procedure: 2D Echo, Color Doppler and Cardiac Doppler Indications:    ACUTE CHF  History:        Patient has prior history of Echocardiogram examinations, most                 recent 07/07/2020. Risk Factors:Diabetes and Hypertension. GERD.  Sonographer:    Beryle Beams Referring Phys: [id number] [place]  1. Left ventricular ejection fraction, by estimation, is 60 to  65%. The left ventricle has normal function. The left ventricle has no regional wall motion abnormalities. There is mild left ventricular hypertrophy. Left ventricular diastolic parameters are consistent with Grade II diastolic dysfunction (pseudonormalization). Elevated left atrial pressure.  2. Right ventricular systolic function is normal. The right ventricular size is normal.  3. The mitral valve is normal in structure. Trivial mitral valve regurgitation. No evidence of mitral stenosis.  4. The aortic valve is tricuspid. Aortic valve regurgitation is not visualized. Aortic valve sclerosis is present, with no evidence of aortic valve stenosis.  5. The  inferior vena cava is normal in size with greater than 50% respiratory variability, suggesting right atrial pressure of 3 mmHg. FINDINGS  Left Ventricle: Left ventricular ejection fraction, by estimation, is 60 to 65%. The left ventricle has normal function. The left ventricle has no regional wall motion abnormalities. The left ventricular internal cavity size was normal in size. There is  mild left ventricular hypertrophy. Left ventricular diastolic parameters are consistent with Grade II diastolic dysfunction (pseudonormalization). Elevated left atrial pressure. Right Ventricle: The right ventricular size is normal. Right ventricular systolic function is normal. Left Atrium: Left atrial size was normal in size. Right Atrium: Right atrial size was normal in size. Pericardium: Trivial pericardial effusion is present. Mitral Valve: The mitral valve is normal in structure. Trivial mitral valve regurgitation. No evidence of mitral valve stenosis. Tricuspid Valve: The tricuspid valve is normal in structure. Tricuspid valve regurgitation is not demonstrated. No evidence of tricuspid stenosis. Aortic Valve: The aortic valve is tricuspid. Aortic valve regurgitation is not visualized. Aortic valve sclerosis is present, with no evidence of aortic valve stenosis. Aortic valve mean gradient measures 3.0 mmHg. Aortic valve peak gradient measures 5.7  mmHg. Aortic valve area, by VTI measures 2.02 cm. Pulmonic Valve: The pulmonic valve was normal in structure. Pulmonic valve regurgitation is not visualized. No evidence of pulmonic stenosis. Aorta: The aortic root is normal in size and structure. Venous: The inferior vena cava is normal in size with greater than 50% respiratory variability, suggesting right atrial pressure of 3 mmHg. IAS/Shunts: No atrial level shunt detected by color flow Doppler.  LEFT VENTRICLE PLAX 2D LVIDd:         4.10 cm     Diastology LVIDs:         3.00 cm     LV e' medial:    6.64 cm/s LV PW:          1.30 cm     LV E/e' medial:  18.4 LV IVS:        1.20 cm     LV e' lateral:   9.03 cm/s LVOT diam:     2.00 cm     LV E/e' lateral: 13.5 LV SV:         48 LV SV Index:   21 LVOT Area:     3.14 cm  LV Volumes (MOD) LV vol d, MOD A2C: 80.1 ml LV vol d, MOD A4C: 89.6 ml LV vol s, MOD A2C: 35.2 ml LV vol s, MOD A4C: 38.3 ml LV SV MOD A2C:     44.9 ml LV SV MOD A4C:     89.6 ml LV SV MOD BP:      47.3 ml RIGHT VENTRICLE RV S prime:     10.50 cm/s TAPSE (M-mode): 2.0 cm LEFT ATRIUM             Index        RIGHT ATRIUM  Index LA diam:        4.60 cm 2.03 cm/m   RA Area:     10.50 cm LA Vol (A2C):   69.8 ml 30.85 ml/m  RA Volume:   19.70 ml  8.71 ml/m LA Vol (A4C):   60.1 ml 26.56 ml/m LA Biplane Vol: 67.5 ml 29.83 ml/m  AORTIC VALVE                    PULMONIC VALVE AV Area (Vmax):    1.92 cm     PV Vmax:       0.52 m/s AV Area (Vmean):   1.90 cm     PV Vmean:      35.100 cm/s AV Area (VTI):     2.02 cm     PV VTI:        0.109 m AV Vmax:           119.00 cm/s  PV Peak grad:  1.1 mmHg AV Vmean:          80.100 cm/s  PV Mean grad:  1.0 mmHg AV VTI:            0.236 m AV Peak Grad:      5.7 mmHg AV Mean Grad:      3.0 mmHg LVOT Vmax:         72.80 cm/s LVOT Vmean:        48.500 cm/s LVOT VTI:          0.152 m LVOT/AV VTI ratio: 0.64  AORTA Ao Root diam: 2.80 cm Ao Asc diam:  3.10 cm MITRAL VALVE MV Area (PHT): 4.71 cm     SHUNTS MV Decel Time: 161 msec     Systemic VTI:  0.15 m MV E velocity: 122.00 cm/s  Systemic Diam: 2.00 cm MV A velocity: 48.00 cm/s MV E/A ratio:  2.54 Kirk Ruths MD Electronically signed by Kirk Ruths MD Signature Date/Time: 02/02/2021/11:27:31 AM    Final     Microbiology: Results for orders placed or performed during the hospital encounter of 01/31/21  Resp Panel by RT-PCR (Flu A&B, Covid) Nasopharyngeal Swab     Status: None   Collection Time: 01/31/21  8:43 PM   Specimen: Nasopharyngeal Swab; Nasopharyngeal(NP) swabs in vial transport medium  Result Value Ref Range  Status   SARS Coronavirus 2 by RT PCR NEGATIVE NEGATIVE Final    Comment: (NOTE) SARS-CoV-2 target nucleic acids are NOT DETECTED.  The SARS-CoV-2 RNA is generally detectable in upper respiratory specimens during the acute phase of infection. The lowest concentration of SARS-CoV-2 viral copies this assay can detect is 138 copies/mL. A negative result does not preclude SARS-Cov-2 infection and should not be used as the sole basis for treatment or other patient management decisions. A negative result may occur with  improper specimen collection/handling, submission of specimen other than nasopharyngeal swab, presence of viral mutation(s) within the areas targeted by this assay, and inadequate number of viral copies(<138 copies/mL). A negative result must be combined with clinical observations, patient history, and epidemiological information. The expected result is Negative.  Fact Sheet for Patients:  EntrepreneurPulse.com.au  Fact Sheet for Healthcare Providers:  IncredibleEmployment.be  This test is no t yet approved or cleared by the [place] FDA and  has been authorized for detection and/or diagnosis of SARS-CoV-2 by FDA under an Emergency Use Authorization (EUA). This EUA will remain  in effect (meaning this test can be used) for the duration of the COVID-19  declaration under Section 564(b)(1) of the Act, 21 U.S.C.section 360bbb-3(b)(1), unless the authorization is terminated  or revoked sooner.       Influenza A by PCR NEGATIVE NEGATIVE Final   Influenza B by PCR NEGATIVE NEGATIVE Final    Comment: (NOTE) The Xpert Xpress SARS-CoV-2/FLU/RSV plus assay is intended as an aid in the diagnosis of influenza from Nasopharyngeal swab specimens and should not be used as a sole basis for treatment. Nasal washings and aspirates are unacceptable for Xpert Xpress SARS-CoV-2/FLU/RSV testing.  Fact Sheet for  Patients: EntrepreneurPulse.com.au  Fact Sheet for Healthcare Providers: IncredibleEmployment.be  This test is not yet approved or cleared by the [place] FDA and has been authorized for detection and/or diagnosis of SARS-CoV-2 by FDA under an Emergency Use Authorization (EUA). This EUA will remain in effect (meaning this test can be used) for the duration of the COVID-19 declaration under Section 564(b)(1) of the Act, 21 U.S.C. section 360bbb-3(b)(1), unless the authorization is terminated or revoked.  Performed at [facility], [address] [place], [place] [address]   Respiratory (~20 pathogens) panel by PCR     Status: None   Collection Time: 02/05/21  8:29 AM   Specimen: Nasopharyngeal Swab; Respiratory  Result Value Ref Range Status   Adenovirus NOT DETECTED NOT DETECTED Final   Coronavirus 229E NOT DETECTED NOT DETECTED Final    Comment: (NOTE) The Coronavirus on the Respiratory Panel, DOES NOT test for the novel  Coronavirus (2019 nCoV)    Coronavirus HKU1 NOT DETECTED NOT DETECTED Final   Coronavirus NL63 NOT DETECTED NOT DETECTED Final   Coronavirus OC43 NOT DETECTED NOT DETECTED Final   Metapneumovirus NOT DETECTED NOT DETECTED Final   Rhinovirus / Enterovirus NOT DETECTED NOT DETECTED Final   Influenza A NOT DETECTED NOT DETECTED Final   Influenza B NOT DETECTED NOT DETECTED Final   Parainfluenza Virus 1 NOT DETECTED NOT DETECTED Final   Parainfluenza Virus 2 NOT DETECTED NOT DETECTED Final   Parainfluenza Virus 3 NOT DETECTED NOT DETECTED Final   Parainfluenza Virus 4 NOT DETECTED NOT DETECTED Final   Respiratory Syncytial Virus NOT DETECTED NOT DETECTED Final   Bordetella pertussis NOT DETECTED NOT DETECTED Final   Bordetella Parapertussis NOT DETECTED NOT DETECTED Final   Chlamydophila pneumoniae NOT DETECTED NOT DETECTED Final   Mycoplasma pneumoniae NOT DETECTED NOT DETECTED Final    Comment:  Performed at [facility], [address] [place], [place] [address]    Labs: CBC: Recent Labs  Lab 02/01/21 1508 02/02/21 0459 02/03/21 0425 02/04/21 0754 02/05/21 0437 02/07/21 0438  WBC 9.5 7.7 8.1 7.1 6.7 7.7  NEUTROABS 7.6  --  6.1 5.3 4.9 5.3  HGB 9.5* 8.5* 8.0* 8.2* 8.3* 9.0*  HCT 29.2* 26.2* 25.1* 24.9* 26.1* 27.4*  MCV 93.0 93.6 95.1 93.3 94.2 92.9  PLT 288 258 280 322 359 256*   Basic Metabolic Panel: Recent Labs  Lab 02/03/21 0425 02/04/21 0444 02/05/21 0437 02/06/21 0423 02/07/21 0438 02/08/21 0428  NA 134* 134* 134* 135 137 137  K 4.2 4.4 4.2 4.1 4.1 4.0  CL 102 101 101 101 102 104  CO2 24 23 24 25 26 26   GLUCOSE 91 132* 153* 175* 219* 230*  BUN 55* 62* 63* 57* 50* 49*  CREATININE 3.43* 3.57* 3.31* 2.88* 2.76* 2.73*  CALCIUM 7.8* 8.0* 8.2* 8.2* 8.1* 8.0*  MG 2.1  --   --   --   --   --    Liver  Function Tests: Recent Labs  Lab 02/01/21 1508 02/02/21 0459 02/03/21 0425 02/05/21 0437 02/07/21 0438  AST 33 22 27 14* 16  ALT 28 22 24 18 18   ALKPHOS 142* 115 136* 165* 192*  BILITOT 1.1 0.4 0.6 0.9 0.6  PROT 6.6 5.5* 6.0* 6.3* 6.8  ALBUMIN 3.1* 2.6* 2.5* 2.5* 2.5*   CBG: Recent Labs  Lab 02/07/21 1633 02/07/21 2008 02/08/21 0304 02/08/21 0732 02/08/21 1158  GLUCAP 239* 202* 191* 214* 207*    Discharge time spent: 35 minutes.  Signed: Edwin Dada, MD [facility] 02/08/2021

## 2021-02-08 NOTE — Plan of Care (Signed)
  Problem: Education: Goal: Ability to demonstrate management of disease process will improve Outcome: Progressing   Problem: Activity: Goal: Capacity to carry out activities will improve Outcome: Progressing   

## 2021-02-08 NOTE — Progress Notes (Addendum)
Progress Note  Patient Name: Ryan Jenkins Date of Encounter: 02/08/2021  Primary Cardiologist: [name], [place] remains improved. He feels he's come a long way. Still with intermittent lower ribcage discomfort (has had on/off since admission) with some tenderness to palpation - started after coughing.  Inpatient Medications    Scheduled Meds:  amLODipine  10 mg Oral Daily   aspirin EC  81 mg Oral Daily   atorvastatin  40 mg Oral Daily   Chlorhexidine Gluconate Cloth  6 each Topical Daily   escitalopram  5 mg Oral Daily   finasteride  5 mg Oral Daily   furosemide  40 mg Oral BID   heparin  5,000 Units Subcutaneous Q8H   hydrALAZINE  100 mg Oral TID   insulin aspart  0-9 Units Subcutaneous TID WC   lidocaine  1 patch Transdermal Q24H   mouth rinse  15 mL Mouth Rinse BID   melatonin  3 mg Oral QHS   metoprolol succinate  50 mg Oral QHS   pantoprazole  40 mg Oral Daily   polyethylene glycol  17 g Oral Daily   Continuous Infusions:  sodium chloride Stopped (02/06/21 1334)   ceFEPime (MAXIPIME) IV 2 g (02/07/21 2125)   PRN Meds: sodium chloride, acetaminophen **OR** acetaminophen, albuterol, guaiFENesin-dextromethorphan, lip balm, menthol-cetylpyridinium, ondansetron **OR** ondansetron (ZOFRAN) IV, sodium chloride, traMADol   Vital Signs    Vitals:   02/07/21 1344 02/07/21 2011 02/08/21 0502 02/08/21 0800  BP: (!) 154/76 (!) 152/70 (!) 154/74   Pulse: 88 95 83   Resp: 18 18 18 14   Temp: (!) 97.5 F (36.4 C) 98.7 F (37.1 C) 98.3 F (36.8 C)   TempSrc: Oral Oral Oral   SpO2: 95% 95% 91%   Weight:      Height:        Intake/Output Summary (Last 24 hours) at 02/08/2021 0851 Last data filed at 02/08/2021 0600 Gross per 24 hour  Intake 780 ml  Output 1150 ml  Net -370 ml   Last 3 Weights 02/07/2021 02/06/2021 02/05/2021  Weight (lbs) 207 lb 11.2 oz 211 lb 8 oz 217 lb 9.5 oz  Weight (kg) 94.212 kg 95.936 kg 98.7 kg      Telemetry    NSR - ringing for VT/VF this AM but this is artifactual as you can march QRS complexes throughout - Personally Reviewed  Physical Exam   GEN: No acute distress.  HEENT: Normocephalic, atraumatic, sclera non-icteric. Neck: No JVD or bruits. Cardiac: RRR no murmurs, rubs, or gallops.  Respiratory: Clear to auscultation bilaterally. Breathing is unlabored. GI: Soft, nontender, non-distended, BS +x 4. MS: no deformity. Extremities: No clubbing or cyanosis. No edema. Distal pedal pulses are 2+ and equal bilaterally. Neuro:  AAOx3. Follows commands. Psych:  Responds to questions appropriately with a normal affect.  Labs    High Sensitivity Troponin:   Recent Labs  Lab 02/01/21 0916 02/01/21 1508 02/01/21 1644 02/03/21 1222 02/03/21 1355  TROPONINIHS 68* 65* 77* 59* 57*      Cardiac EnzymesNo results for input(s): TROPONINI in the last 168 hours. No results for input(s): TROPIPOC in the last 168 hours.   Chemistry Recent Labs  Lab 02/03/21 0425 02/04/21 0444 02/05/21 0437 02/06/21 0423 02/07/21 0438 02/08/21 0428  NA 134*   < > 134* 135 137 137  K 4.2   < > 4.2 4.1 4.1 4.0  CL 102   < > 101 101 102 104  CO2  24   < > 24 25 26 26   GLUCOSE 91   < > 153* 175* 219* 230*  BUN 55*   < > 63* 57* 50* 49*  CREATININE 3.43*   < > 3.31* 2.88* 2.76* 2.73*  CALCIUM 7.8*   < > 8.2* 8.2* 8.1* 8.0*  PROT 6.0*  --  6.3*  --  6.8  --   ALBUMIN 2.5*  --  2.5*  --  2.5*  --   AST 27  --  14*  --  16  --   ALT 24  --  18  --  18  --   ALKPHOS 136*  --  165*  --  192*  --   BILITOT 0.6  --  0.9  --  0.6  --   GFRNONAA 20*   < > 20* 24* 25* 26*  ANIONGAP 8   < > 9 9 9 7    < > = values in this interval not displayed.     Hematology Recent Labs  Lab 02/04/21 0754 02/05/21 0437 02/07/21 0438  WBC 7.1 6.7 7.7  RBC 2.67* 2.77* 2.95*  HGB 8.2* 8.3* 9.0*  HCT 24.9* 26.1* 27.4*  MCV 93.3 94.2 92.9  MCH 30.7 30.0 30.5  MCHC 32.9 31.8 32.8  RDW 13.1 12.8 12.8  PLT  322 359 469*    BNP Recent Labs  Lab 02/01/21 0916 02/04/21 0754 02/05/21 0831  BNP 862.4* 803.1* 863.4*     DDimer  Recent Labs  Lab 02/01/21 1644  DDIMER 1.26*     Radiology    DG CHEST PORT 1 VIEW  Result Date: 02/07/2021 CLINICAL DATA:  Pneumonia.  Follow-up exam. EXAM: PORTABLE CHEST 1 VIEW COMPARISON:  12/07/2021 FINDINGS: Persistent airspace disease in the right lung particularly in the right perihilar region. Aeration at the left lung base has slightly improved. Heart size is stable. Trachea is midline. Negative for a pneumothorax. IMPRESSION: 1. Improved aeration at the left lung base. 2. No significant change in the parenchymal disease / airspace disease in the right lung. Electronically Signed   By: Markus Daft M.D.   On: 02/07/2021 08:50    Cardiac Studies     2d echo 02/02/21  1. Left ventricular ejection fraction, by estimation, is 60 to 65%. The  left ventricle has normal function. The left ventricle has no regional  wall motion abnormalities. There is mild left ventricular hypertrophy.  Left ventricular diastolic parameters  are consistent with Grade II diastolic dysfunction (pseudonormalization).  Elevated left atrial pressure.   2. Right ventricular systolic function is normal. The right ventricular  size is normal.   3. The mitral valve is normal in structure. Trivial mitral valve  regurgitation. No evidence of mitral stenosis.   4. The aortic valve is tricuspid. Aortic valve regurgitation is not  visualized. Aortic valve sclerosis is present, with no evidence of aortic  valve stenosis.   5. The inferior vena cava is normal in size with greater than 50%  respiratory variability, suggesting right atrial pressure of 3 mmHg.   Patient Profile     61 y.o. male with chronic diatolic CHF, CAD, uncontrolled diabetes with polyneuropathy, longstanding HTN, CKD stage IV, poor ambulatory status, GERD, prior vitreous hemorrhage of left eye 2021, BPH. Has been  challenging to discern what the patient was taking prior to admission, has received care at variable institutions. Presented to [facility] ED with LE edema, episodic paroxysms of chest pain, SOB, oliguria and dry cough for 1  weeks' time. Troponins low/flat. Treated with diuresis but continued to have hypoxia. VQ negative for PE. Repeat CXR concerning for PNA, felt likely aspiration. Started on abx with improvement in O2 requirement. Nephrology also followed for AKI on CKD.  Assessment & Plan    1. Acute hypoxic respiratory failure, suspected aspiration PNA with acute on chronic diastolic CHF  - started on diuretics on admission - 2V CXR 02/06/21 more clearly showed left confluent appearance of right perihilar opacity with increased patchy density in RUL/LLL suspicious for PNA - IM team felt he likely had aspiration PNA present on admission -> started on abx with significant improvement in oxygenation over several days' time -> now on RA - RVP negative - transitioned to oral Lasix yesterday - will discuss further recs with MD - primary cardiologist is @ [facility] and patient confirms desire to continue to follow up there (did offer f/u with [facility] if needed but patient politely declined)   2. AKI superimposed on CKD stage IV - may be multifactorial in setting of known renal disease, +/- cardiorenal state, diuretic use - previously required RRT during similar admission in [place] - holding lisinopril, chlorthalidone - peak Cr 3.57, appears to be plateauing in the 2.7 range - renal s/o yesterday and they arranged their follow-up   3. CAD (60% RCA 2021), episodic chest pain, low/flat chronic troponemia, mixed features - initial EKG concerning for ischemia, but follow-up tracings improved and troponin remained relatively flat  - echo 02/02/21 with normal EF and no RWMA - continue ASA, BB, statin - given CKD, medical therapy advised prior to proceeding with cath due to risk of progressive renal failure -  mixed features of chest discomfort this admission - this AM he's been experiencing some intermittent ribcage discomfort with TTP, no associated sx -> will repeat EKG this AM (addendum: repeat EKG shows NSR 86bpm, possible prior anterolateral infarct, probable LVH, nonspecific TW flattening I, avL, no change from prior)   4. HTN - amlodipine has been increased back from 2.5 to 63m daily to address rising BP, follow with this change - also on Lasix 462mPO BID, hydralazine 1003mID, Toprol 69m22mily   5. DM - poorly controlled, A1C 13.4% - SGLT2i on hold in setting of renal issues - per medicine team   Other issues per primary team to include - anemia, thrombocytosis - elevated alk phos   For questions or updates, please contact [facility] consult [email or web address] for contact info under Cardiology/STEMI.  Signed, DaynCharlie Pitter-C 02/08/2021, 8:51 AM

## 2021-02-20 ENCOUNTER — Encounter (HOSPITAL_BASED_OUTPATIENT_CLINIC_OR_DEPARTMENT_OTHER): Payer: Self-pay | Admitting: Urology

## 2021-02-20 ENCOUNTER — Other Ambulatory Visit: Payer: Self-pay

## 2021-02-20 ENCOUNTER — Emergency Department (HOSPITAL_BASED_OUTPATIENT_CLINIC_OR_DEPARTMENT_OTHER)
Admission: EM | Admit: 2021-02-20 | Discharge: 2021-02-20 | Disposition: A | Discharge status: Home / Self Care | Payer: Medicaid Other | Attending: Emergency Medicine | Admitting: Emergency Medicine

## 2021-02-20 DIAGNOSIS — R0602 Shortness of breath: Secondary | ICD-10-CM | POA: Diagnosis not present

## 2021-02-20 DIAGNOSIS — Z794 Long term (current) use of insulin: Secondary | ICD-10-CM | POA: Insufficient documentation

## 2021-02-20 DIAGNOSIS — Z79899 Other long term (current) drug therapy: Secondary | ICD-10-CM | POA: Diagnosis not present

## 2021-02-20 DIAGNOSIS — Z7982 Long term (current) use of aspirin: Secondary | ICD-10-CM | POA: Diagnosis not present

## 2021-02-20 DIAGNOSIS — R21 Rash and other nonspecific skin eruption: Secondary | ICD-10-CM | POA: Diagnosis present

## 2021-02-20 DIAGNOSIS — B029 Zoster without complications: Secondary | ICD-10-CM

## 2021-02-20 DIAGNOSIS — Z91040 Latex allergy status: Secondary | ICD-10-CM | POA: Insufficient documentation

## 2021-02-20 MED ORDER — OXYCODONE-ACETAMINOPHEN 5-325 MG PO TABS
1.0000 | ORAL_TABLET | Freq: Once | ORAL | Status: AC
  Administered 2021-02-20: 1 via ORAL
  Filled 2021-02-20: qty 1

## 2021-02-20 MED ORDER — OXYCODONE-ACETAMINOPHEN 5-325 MG PO TABS: 1.0000 | ORAL_TABLET | Freq: Four times a day (QID) | ORAL | 0 refills | Status: DC | PRN

## 2021-02-20 MED ORDER — VALACYCLOVIR HCL 1 G PO TABS: 1000.0000 mg | ORAL_TABLET | Freq: Three times a day (TID) | ORAL | 0 refills | Status: DC

## 2021-02-20 NOTE — ED Provider Notes (Signed)
[facility] EMERGENCY DEPT Provider Note   CSN: [id number] Arrival date & time: 02/20/21  1914     History  Chief Complaint  Patient presents with   Rash    Ryan Jenkins is a 61 y.o. male.  He is here with a painful rash across his upper right trunk that began about a week ago.  He saw his PA about it but he did not have the rash initially and it was thought to be muscular.  He was recently admitted for CHF and pneumonia and feels his breathing is improving slowly.  No fevers or chills.  The history is provided by the patient.  Rash Location:  Torso Torso rash location:  R axilla, R chest and upper back Quality: painful and redness   Pain details:    Quality:  Stinging and aching   Severity:  Moderate   Onset quality:  Gradual   Duration:  1 week   Timing:  Constant   Progression:  Unchanged Chronicity:  New Relieved by:  Nothing Worsened by:  Nothing Ineffective treatments:  None tried Associated symptoms: shortness of breath (improving since admission)   Associated symptoms: no abdominal pain, no fever, no nausea and not vomiting       Home Medications Prior to Admission medications   Medication Sig Start Date End Date Taking? Authorizing Provider  acetaminophen (TYLENOL) 500 MG tablet Take 1,000 mg by mouth every 6 (six) hours as needed for mild pain or headache.    [provider]  amLODipine (NORVASC) 10 MG tablet Take 1 tablet (10 mg total) by mouth daily. 02/08/21   Danford, Suann Larry, MD  aspirin EC 81 MG EC tablet Take 1 tablet (81 mg total) by mouth daily. Swallow whole. 02/09/21   Danford, Suann Larry, MD  atorvastatin (LIPITOR) 40 MG tablet Take 1 tablet (40 mg total) by mouth daily. 02/08/21   Danford, Suann Larry, MD  Blood Glucose Monitoring Suppl (TRUE METRIX METER) w/Device KIT Use as directed 07/20/20   Ladell Pier, MD  Blood Pressure Monitor DEVI Use as directed to check home blood pressure 2-3 times a week 07/13/20    Camillia Herter, NP  D-Mannose 350 MG CAPS Take 1,050 capsules by mouth in the morning and at bedtime.    [provider]  escitalopram (LEXAPRO) 20 MG tablet Take 1 tablet (20 mg total) by mouth daily. 07/20/20   Charlott Rakes, MD  finasteride (PROSCAR) 5 MG tablet Take 1 tablet (5 mg total) by mouth daily. 07/20/20   Charlott Rakes, MD  furosemide (LASIX) 40 MG tablet Take 1 tablet (40 mg total) by mouth 2 (two) times daily. 02/08/21   Danford, Suann Larry, MD  gabapentin (NEURONTIN) 100 MG capsule Take 2 capsules (200 mg total) by mouth daily for 21 days. 12/10/20 12/31/20  Redwine, [place] A, PA-C  glucose blood (TRUE METRIX BLOOD GLUCOSE TEST) test strip Use as instructed 07/20/20   Ladell Pier, MD  hydrALAZINE (APRESOLINE) 100 MG tablet Take 1 tablet (100 mg total) by mouth 3 (three) times daily. 02/08/21   Danford, Suann Larry, MD  Insulin Syringe-Needle U-100 (TRUEPLUS INSULIN SYRINGE) 31G X 5/16" 0.3 ML MISC Use to inject Levemir at bedtime. 07/20/20   Charlott Rakes, MD  metoprolol succinate (TOPROL-XL) 50 MG 24 hr tablet Take 1 tablet (50 mg total) by mouth at bedtime. Take with or immediately following a meal. 02/08/21   Danford, Suann Larry, MD  nortriptyline (PAMELOR) 25 MG capsule Take 1  capsule (25 mg total) by mouth at bedtime. 07/20/20   Charlott Rakes, MD  pantoprazole (PROTONIX) 40 MG tablet Take 1 tablet (40 mg total) by mouth daily. 02/08/21   Danford, Suann Larry, MD  potassium chloride (KLOR-CON M) 10 MEQ tablet Take 1 tablet (10 mEq total) by mouth daily. 02/08/21   Danford, Suann Larry, MD  traZODone (DESYREL) 50 MG tablet Take 3 tablets (150 mg total) by mouth at bedtime. Patient taking differently: Take 150 mg by mouth at bedtime as needed for sleep. 07/20/20   Charlott Rakes, MD  TRUEplus Lancets 28G MISC Use as directed 07/20/20   Ladell Pier, MD      Allergies    Claritin [loratadine], Hydrochlorothiazide, Latex, Lyrica [pregabalin], and  Metformin and related    Review of Systems   Review of Systems  Constitutional:  Negative for fever.  Respiratory:  Positive for shortness of breath (improving since admission).   Cardiovascular:  Negative for chest pain.  Gastrointestinal:  Negative for abdominal pain, nausea and vomiting.  Musculoskeletal:  Positive for back pain.  Skin:  Positive for rash.   Physical Exam Updated Vital Signs BP (!) 152/78    Pulse 94    Temp 98 F (36.7 C) (Oral)    Resp 18    Ht 6' 2"  (1.88 m)    Wt 94.2 kg    SpO2 97%    BMI 26.67 kg/m  Physical Exam Vitals and nursing note reviewed.  Constitutional:      General: He is not in acute distress.    Appearance: Normal appearance. He is well-developed.  HENT:     Head: Normocephalic and atraumatic.  Eyes:     Conjunctiva/sclera: Conjunctivae normal.  Cardiovascular:     Rate and Rhythm: Normal rate and regular rhythm.     Heart sounds: No murmur heard. Pulmonary:     Effort: Pulmonary effort is normal. No respiratory distress.     Breath sounds: Normal breath sounds.  Abdominal:     Palpations: Abdomen is soft.     Tenderness: There is no abdominal tenderness.  Musculoskeletal:        General: No swelling.     Cervical back: Neck supple.  Skin:    General: Skin is warm and dry.     Capillary Refill: Capillary refill takes less than 2 seconds.     Findings: Rash present.     Comments: Patient is a dermatomal vesicular rash across his right upper back into his axilla  Neurological:     Mental Status: He is alert.    ED Results / Procedures / Treatments   Labs (all labs ordered are listed, but only abnormal results are displayed) Labs Reviewed - No data to display  EKG None  Radiology No results found.  Procedures Procedures    Medications Ordered in ED Medications - No data to display  ED Course/ Medical Decision Making/ A&P                           Medical Decision Making Risk Prescription drug  management.  61 year old male here with painful vesicular rash over his right axilla and upper back.  Rash consistent with zoster.  Otherwise nontoxic-appearing.  Reviewed prescribing information in PMP.  Provide prescription medication and antiviral.  Commended close follow-up with PCP.  Return instructions discussed         Final Clinical Impression(s) / ED Diagnoses Final diagnoses:  Herpes zoster without  complication    Rx / DC Orders ED Discharge Orders          Ordered    oxyCODONE-acetaminophen (PERCOCET/ROXICET) 5-325 MG tablet  Every 6 hours PRN        02/20/21 2209    valACYclovir (VALTREX) 1000 MG tablet  3 times daily        02/20/21 2209              Hayden Rasmussen, MD 02/21/21 [phone]

## 2021-02-20 NOTE — Discharge Instructions (Signed)
You are seen in the emergency department for painful rash on your right trunk.  This is shingles.  We are prescribing you an antivirus medication and some pain medication.  Please use caution with the pain medication as it may make you dizzy nauseous and constipated.  Follow-up with your regular doctor.  Return to the emergency department if any worsening or concerning symptoms.

## 2021-02-20 NOTE — ED Triage Notes (Signed)
Rash to right chest with blistering around to back started 1 week ago  Painful  Concern for shingles

## 2021-02-28 ENCOUNTER — Ambulatory Visit: Payer: Medicaid Other | Admitting: Physical Therapy

## 2021-03-08 ENCOUNTER — Encounter: Payer: Self-pay | Admitting: Nurse Practitioner

## 2021-03-15 ENCOUNTER — Other Ambulatory Visit: Payer: Self-pay

## 2021-03-15 ENCOUNTER — Ambulatory Visit: Payer: Medicaid Other | Attending: Family Medicine | Admitting: Physical Therapy

## 2021-03-15 ENCOUNTER — Encounter: Payer: Self-pay | Admitting: Physical Therapy

## 2021-03-15 DIAGNOSIS — R2681 Unsteadiness on feet: Secondary | ICD-10-CM | POA: Insufficient documentation

## 2021-03-15 DIAGNOSIS — M6281 Muscle weakness (generalized): Secondary | ICD-10-CM | POA: Insufficient documentation

## 2021-03-15 DIAGNOSIS — R296 Repeated falls: Secondary | ICD-10-CM | POA: Insufficient documentation

## 2021-03-15 DIAGNOSIS — R2689 Other abnormalities of gait and mobility: Secondary | ICD-10-CM | POA: Insufficient documentation

## 2021-03-15 NOTE — Therapy (Signed)
OUTPATIENT PHYSICAL THERAPY LOWER EXTREMITY EVALUATION   Patient Name: Ryan Jenkins MRN: [id number] DOB:11-10-1960, 61 y.o., male Today's Date: 03/15/2021   PT End of Session - 03/15/21 1526     Visit Number 1    Date for PT Re-Evaluation 05/10/21    Authorization Type [employer]    Authorization Time Period request authorization after 12th visit    Authorization - Number of Visits 12    PT Start Time 0330    PT Stop Time 0412    PT Time Calculation (min) 42 min    Equipment Utilized During Treatment Gait belt    Activity Tolerance Patient tolerated treatment well    Behavior During Therapy WFL for tasks assessed/performed             Past Medical History:  Diagnosis Date   Anemia    Anxiety    BPH (benign prostatic hyperplasia)    CAD (coronary artery disease)    Chronic diastolic CHF (congestive heart failure) ([place])    Chronic kidney disease, stage IV (severe) ([place])    Depression    Diabetes mellitus with complication ([place])    Diabetic neuropathy ([place])    GERD (gastroesophageal reflux disease)    Hypertension    Retinopathy due to secondary diabetes ([place])    Sleep disturbances 11/25/2018   Vision changes 11/25/2018   Vitamin B12 deficiency    Vitreous hemorrhage ([place])    Past Surgical History:  Procedure Laterality Date   CATARACT EXTRACTION Bilateral 2017   Patient Active Problem List   Diagnosis Date Noted   Coronary artery disease 02/07/2021   Acute renal failure superimposed on stage 4 chronic kidney disease (HCC)    Acute respiratory failure with hypoxia ([place]) 02/01/2021   Acute on chronic congestive heart failure ([place]) 01/31/2021   Acute CHF (congestive heart failure) ([place]) 08/03/2020   CKD (chronic kidney disease) stage 4, GFR 15-29 ml/min ([place]) 07/07/2020   Chest pain 07/07/2020   Acute on chronic diastolic CHF (congestive heart failure) ([place]) 07/07/2020   Erectile dysfunction associated with type 2 diabetes mellitus ([place]) 08/07/2019    Hives 08/07/2019   Vitreous hemorrhage of left eye ([place]) 07/22/2019   Elevated troponin 07/22/2019   Vision loss of left eye 07/22/2019   History of medication noncompliance 04/02/2019   Gastric ulcer without hemorrhage or perforation 12/25/2018   CKD (chronic kidney disease), stage III ([place]) 12/25/2018   Gastroesophageal reflux disease without esophagitis 09/04/2018   Diabetic retinopathy of both eyes associated with type 2 diabetes mellitus ([place]) 01/03/2018   Intermittent diarrhea 09/27/2017   Gastroparesis 09/27/2017   Macroalbuminuric diabetic nephropathy ([place]) 06/25/2017   History of falling 06/25/2017   Hyperlipidemia 03/21/2017   Vitamin B 12 deficiency 03/21/2017   Type 2 diabetes mellitus with peripheral neuropathy ([place]) 02/05/2017   Essential hypertension 02/05/2017   Depression 02/05/2017   Unintended weight loss 02/05/2017   Gait disturbance 02/05/2017   Pronation deformity of both feet 05/11/2014   Diabetic neuropathy, type II diabetes mellitus ([place]) 05/11/2014   Metatarsal deformity 05/11/2014    PCP: Armanda Heritage, NP  REFERRING PROVIDER: Edwin Dada,*  REFERRING DIAG: Unsteady gait [R26.81]  THERAPY DIAG:  Other abnormalities of gait and mobility  Unsteadiness on feet  Repeated falls  ONSET DATE: Chronic  SUBJECTIVE:  MOI/History of condition:   Ryan Jenkins is a 61 y.o. male who presents to clinic with chief complaint of weakness in legs and trouble walking (chronic) d/t deconditioning with frequent falls.  Slow onset of weakness is chronic d/t complex medical history.  He is unable to lay flat on back d/t CHF.  His weakness prevents him from completing ADLs.  He is recovering from shingles and is still having pain in his R axillary area.  Pt reports he  monitors BP at home and expects to receive a pulse ox shortly.  He reports he monitors his blood sugar regularly "i'm back on the wagon".   Red flags:  denies chest pain, pain in L UQ, and dyspnea  Pertinent past history:  CHF, diabetes, peripheral neuropathy in feet and hands (no feeling), Fall risk, stage 4 CKD  Pain:  Are you having pain? No  Occupation: NA  Hobbies/Recreation: NA  Assistive Device: FWW  Patient Goals: work on balance and strength, walk around a store  PRECAUTIONS: Fall, CHF (no recumbent positions), peripheral neuropathy, MONITOR VITALS  WEIGHT BEARING RESTRICTIONS No  FALLS:  Has patient fallen in last 6 months? Yes, Number of falls: 2 (legs "gave away))  LIVING ENVIRONMENT: Lives with: lives with their family Stairs: Yes; Internal: 18 steps; can reach both  PLOF: Independent with basic ADLs  DIAGNOSTIC FINDINGS: NA   OBJECTIVE:    GENERAL OBSERVATION:   Slow gait, unable to stand from chair without min assist  SENSATION:  Light touch: Deficits bil feet and hands (known from PDN)  Vitals: 135/76, 02 sat 99, BPM 84  LE MMT:  MMT Right 03/15/2021 Left 03/15/2021  Hip flexion (L2, L3) 2+ 2+  Knee extension (L3) 3 3  Knee flexion 3 3+  Hip abduction 3 3  Hip extension D (STS) D (STS)  Hip external rotation    Hip internal rotation    Hip adduction    Ankle dorsiflexion (L4) Unable to bil heel raise Unable to bil heel raise  Ankle plantarflexion (S1)    Ankle inversion    Ankle eversion    Great Toe ext (L5)    Grossly     (Blank rows = not tested, score listed is out of 5 possible points.  N = WNL, D = diminished, C = clear for gross weakness with myotome testing, * = concordant pain with testing)  LE ROM:  ROM Right 03/15/2021 Left 03/15/2021  Hip flexion    Hip extension    Hip abduction    Hip adduction    Hip internal rotation    Hip external rotation    Knee flexion    Knee extension    Ankle dorsiflexion    Ankle  plantarflexion    Ankle inversion    Ankle eversion     (Blank rows = not tested, N = WNL, * = concordant pain with testing)  FUNCTIONAL TESTS:  30'' STS: 2x w/ UE  Gait speed: .5 m/s  2 MWT: 16'  Progressive balance screen:  Feet together: 10'' unstable Semi Tandem: R in rear 2'', L in rear 2''  GAIT: Comments: slow unstable gait with wide base of support   TODAY'S TREATMENT: See HEP   PATIENT EDUCATION:  POC, diagnosis, prognosis, HEP, and outcome measures.  Pt educated via explanation, demonstration, and handout (HEP).  Pt confirms understanding verbally.   ASTERISK SIGNS   Asterisk Signs        LE gross MMT  2 MWT 160'       30'' STS 2x                         HOME EXERCISE PROGRAM: Access Code: [email or web address] URL: [email or web address] Date: 03/15/2021 Prepared by: Shearon Balo  Exercises Seated Hip Abduction with Resistance - 1 x daily - 7 x weekly - 3 sets - 10 reps Seated March with Resistance - 1 x daily - 7 x weekly - 3 sets - 10 reps Seated Long Arc Quad - 1 x daily - 7 x weekly - 3 sets - 10 reps Seated Hip Adduction Isometrics with Ball - 1 x daily - 7 x weekly - 1 sets - 10 reps - 10 hold Seated Hamstring Curl with Anchored Resistance - 1 x daily - 7 x weekly - 3 sets - 10 reps   ASSESSMENT:  CLINICAL IMPRESSION: Demitrius is a 61 y.o. male who presents to clinic with signs and sxs consistent with significant deconditioning and gross global weakness with concurrent significant peripheral neuropathy resulting in high fall risk and difficulty with ADLs.  Pt HR and 02 sat monitored throughout with normal response to exercise.  OBJECTIVE IMPAIRMENTS: gait, balance, endurance, global strength  ACTIVITY LIMITATIONS: walking, standing, transfers, bending, lifting, shopping  PERSONAL FACTORS: See medical history and pertinent history   REHAB POTENTIAL: Fair chronic condition with complex medical history    CLINICAL DECISION  MAKING: Stable/uncomplicated  EVALUATION COMPLEXITY: Low   GOALS:  SHORT TERM GOALS:  STG Name Target Date Goal status  1 Monico will be >75% HEP compliant to improve carryover between sessions and facilitate independent management of condition  Baseline: No HEP 04/05/2021 INITIAL   LONG TERM GOALS:   LTG Name Target Date Goal status  1 Faysal will improve the following MMTs to >/= 4/5 to show improvement in strength:  LE grossly    Baseline: see chart 05/10/2021 INITIAL  2 Christien will improve 30'' STS (MCID 2) to >/= 5x (w/ UE?: Y) to show improved LE strength and improved transfers   Baseline: 2x  w/ UE? Y 05/10/2021 INITIAL  3 Salaam will improve 10 meter max gait speed to .70 m/s (.1 m/s MCID) to show functional improvement in ambulation   Eval: .5 m/s  Norms:   05/10/2021 INITIAL  4 [place] will improve two minute walk test to 250 feet (MCID 40 ft).  Baseline: 160' 05/10/2021 INITIAL   PLAN: PT FREQUENCY: 1-2x/week  PT DURATION: 8 weeks (Ending 05/10/2021)  PLANNED INTERVENTIONS: Therapeutic exercises, Aquatic therapy, Therapeutic activity, Neuro Muscular re-education, Gait training, Patient/Family education, Joint mobilization, Dry Needling, Electrical stimulation, Spinal mobilization and/or manipulation, Moist heat, Taping, Vasopneumatic device, Ionotophoresis 4mg /ml Dexamethasone, and Manual therapy  PLAN FOR NEXT SESSION: progressive LE, gait, balance   Shearon Balo PT, DPT 03/15/2021, 5:09 PM

## 2021-03-21 ENCOUNTER — Ambulatory Visit: Payer: Medicaid Other | Admitting: Physical Therapy

## 2021-03-21 ENCOUNTER — Encounter: Payer: Self-pay | Admitting: Physical Therapy

## 2021-03-21 ENCOUNTER — Other Ambulatory Visit: Payer: Self-pay

## 2021-03-21 VITALS — BP 145/81 | HR 86 | Wt 208.0 lb

## 2021-03-21 DIAGNOSIS — R2681 Unsteadiness on feet: Secondary | ICD-10-CM

## 2021-03-21 DIAGNOSIS — M6281 Muscle weakness (generalized): Secondary | ICD-10-CM | POA: Diagnosis not present

## 2021-03-21 DIAGNOSIS — R296 Repeated falls: Secondary | ICD-10-CM

## 2021-03-21 DIAGNOSIS — R2689 Other abnormalities of gait and mobility: Secondary | ICD-10-CM

## 2021-03-21 NOTE — Therapy (Signed)
OUTPATIENT PHYSICAL THERAPY TREATMENT NOTE   Patient Name: Ryan Jenkins MRN: [id number] DOB:1960/03/23, 61 y.o., male Today's Date: 03/21/2021  PCP: Armanda Heritage, NP REFERRING PROVIDER: Armanda Heritage, NP   PT End of Session - 03/21/21 1526     Visit Number 2    Date for PT Re-Evaluation 05/10/21    Authorization Type [employer]    Authorization Time Period request authorization after 12th visit    Authorization - Number of Visits 12    PT Start Time 1530    PT Stop Time 1612    PT Time Calculation (min) 42 min    Equipment Utilized During Treatment Gait belt    Activity Tolerance Patient tolerated treatment well    Behavior During Therapy WFL for tasks assessed/performed             Past Medical History:  Diagnosis Date   Anemia    Anxiety    BPH (benign prostatic hyperplasia)    CAD (coronary artery disease)    Chronic diastolic CHF (congestive heart failure) ([place])    Chronic kidney disease, stage IV (severe) ([place])    Depression    Diabetes mellitus with complication ([place])    Diabetic neuropathy ([place])    GERD (gastroesophageal reflux disease)    Hypertension    Retinopathy due to secondary diabetes ([place])    Sleep disturbances 11/25/2018   Vision changes 11/25/2018   Vitamin B12 deficiency    Vitreous hemorrhage ([place])    Past Surgical History:  Procedure Laterality Date   CATARACT EXTRACTION Bilateral 2017   Patient Active Problem List   Diagnosis Date Noted   Coronary artery disease 02/07/2021   Acute renal failure superimposed on stage 4 chronic kidney disease ([place])    Acute respiratory failure with hypoxia ([place]) 02/01/2021   Acute on chronic congestive heart failure ([place]) 01/31/2021   Acute CHF (congestive heart failure) ([place]) 08/03/2020   CKD (chronic kidney disease) stage 4, GFR 15-29 ml/min ([place]) 07/07/2020   Chest pain 07/07/2020   Acute on chronic diastolic CHF (congestive heart failure) ([place]) 07/07/2020   Erectile dysfunction  associated with type 2 diabetes mellitus ([place]) 08/07/2019   Hives 08/07/2019   Vitreous hemorrhage of left eye ([place]) 07/22/2019   Elevated troponin 07/22/2019   Vision loss of left eye 07/22/2019   History of medication noncompliance 04/02/2019   Gastric ulcer without hemorrhage or perforation 12/25/2018   CKD (chronic kidney disease), stage III ([place]) 12/25/2018   Gastroesophageal reflux disease without esophagitis 09/04/2018   Diabetic retinopathy of both eyes associated with type 2 diabetes mellitus ([place]) 01/03/2018   Intermittent diarrhea 09/27/2017   Gastroparesis 09/27/2017   Macroalbuminuric diabetic nephropathy ([place]) 06/25/2017   History of falling 06/25/2017   Hyperlipidemia 03/21/2017   Vitamin B 12 deficiency 03/21/2017   Type 2 diabetes mellitus with peripheral neuropathy ([place]) 02/05/2017   Essential hypertension 02/05/2017   Depression 02/05/2017   Unintended weight loss 02/05/2017   Gait disturbance 02/05/2017   Pronation deformity of both feet 05/11/2014   Diabetic neuropathy, type II diabetes mellitus ([place]) 05/11/2014   Metatarsal deformity 05/11/2014    REFERRING DIAG: Unsteady gait [R26.81]  THERAPY DIAG:  Muscle weakness  Other abnormalities of gait and mobility  Unsteadiness on feet  Repeated falls  PERTINENT HISTORY: CHF, diabetes, peripheral neuropathy in feet and hands (no feeling), Fall risk, stage 4 CKD  PRECAUTIONS/RESTRICTIONS:   Fall, CHF (no recumbent positions), peripheral neuropathy, MONITOR VITALS  SUBJECTIVE:  Pt reports that he is doing  well overall.  He has been somewhat HEP compliant.  He reports a fall when going to the bathroom with no injury.  He feels like his feet and ankles gave way.  Pain:  Are you having pain? No  OBJECTIVE:  LE MMT:   MMT Right 03/15/2021 Left 03/15/2021  Hip flexion (L2, L3) 2+ 2+  Knee extension (L3) 3 3  Knee flexion 3 3+  Hip abduction 3 3  Hip extension D (STS) D (STS)  Hip external rotation       Hip internal rotation      Hip adduction      Ankle dorsiflexion (L4) Unable to bil heel raise Unable to bil heel raise  Ankle plantarflexion (S1)      Ankle inversion      Ankle eversion      Great Toe ext (L5)      Grossly        (Blank rows = not tested, score listed is out of 5 possible points.  N = WNL, D = diminished, C = clear for gross weakness with myotome testing, * = concordant pain with testing)   LE ROM:   ROM Right 03/15/2021 Left 03/15/2021  Hip flexion      Hip extension      Hip abduction      Hip adduction      Hip internal rotation      Hip external rotation      Knee flexion      Knee extension      Ankle dorsiflexion      Ankle plantarflexion      Ankle inversion      Ankle eversion        (Blank rows = not tested, N = WNL, * = concordant pain with testing)   TREATMENT 03/21/21:  Therapeutic Exercise: - nu-step L3 38mwhile taking subjective and planning session with patient - standing heel raise 3x10 partial ROM - standing march - 3x20 - standing HS curl - 3x20 - STS - no UE - minA - 2x5 68 cm - LAQ - 5# - 3x10   ASTERISK SIGNS     Asterisk Signs  Eval            LE gross MMT              2 MWT 160'            30'' STS 2x                                              HOME EXERCISE PROGRAM: Access Code: [email or web address]: [email or web address] Date: 03/15/2021 Prepared by: KShearon Balo  Exercises Seated Hip Abduction with Resistance - 1 x daily - 7 x weekly - 3 sets - 10 reps Seated March with Resistance - 1 x daily - 7 x weekly - 3 sets - 10 reps Seated Long Arc Quad - 1 x daily - 7 x weekly - 3 sets - 10 reps Seated Hip Adduction Isometrics with Ball - 1 x daily - 7 x weekly - 1 sets - 10 reps - 10 hold Seated Hamstring Curl with Anchored Resistance - 1 x daily - 7 x weekly - 3 sets - 10 reps     ASSESSMENT:   CLINICAL IMPRESSION: CIbrahamis progressing well with therapy.  Pt reports no increase in  baseline pain  following therapy.  Today we concentrated on lower extremity strengthening and increasing activity tolerance.  Pt with min/mod fatigue throughout.  CGA and minA provided throughout as needed.  Will continue to progress as appropriate.  Pt will continue to benefit from skilled physical therapy to address remaining deficits and achieve listed goals.  Continue per POC.    OBJECTIVE IMPAIRMENTS: gait, balance, endurance, global strength   ACTIVITY LIMITATIONS: walking, standing, transfers, bending, lifting, shopping   PERSONAL FACTORS: See medical history and pertinent history     REHAB POTENTIAL: Fair chronic condition with complex medical history     CLINICAL DECISION MAKING: Stable/uncomplicated   EVALUATION COMPLEXITY: Low     GOALS:   SHORT TERM GOALS:   STG Name Target Date Goal status  1 Demarie will be >75% HEP compliant to improve carryover between sessions and facilitate independent management of condition   Baseline: No HEP 04/05/2021 INITIAL    LONG TERM GOALS:    LTG Name Target Date Goal status  1 Jahmel will improve the following MMTs to >/= 4/5 to show improvement in strength:  LE grossly     Baseline: see chart 05/10/2021 INITIAL  2 Tavaughn will improve 30'' STS (MCID 2) to >/= 5x (w/ UE?: Y) to show improved LE strength and improved transfers    Baseline: 2x  w/ UE? Y 05/10/2021 INITIAL  3 Kendricks will improve 10 meter max gait speed to .70 m/s (.1 m/s MCID) to show functional improvement in ambulation    Eval: .5 m/s   Norms:    05/10/2021 INITIAL  4 [place] will improve two minute walk test to 250 feet (MCID 40 ft).   Baseline: 160' 05/10/2021 INITIAL    PLAN: PT FREQUENCY: 1-2x/week   PT DURATION: 8 weeks (Ending 05/10/2021)   PLANNED INTERVENTIONS: Therapeutic exercises, Aquatic therapy, Therapeutic activity, Neuro Muscular re-education, Gait training, Patient/Family education, Joint mobilization, Dry Needling, Electrical stimulation, Spinal  mobilization and/or manipulation, Moist heat, Taping, Vasopneumatic device, Ionotophoresis '4mg'$ /ml Dexamethasone, and Manual therapy   PLAN FOR NEXT SESSION: progressive LE, gait, balance    Kevan Ny Kaydan Wilhoite PT 03/21/2021, 4:15 PM

## 2021-03-23 ENCOUNTER — Other Ambulatory Visit: Payer: Self-pay

## 2021-03-23 ENCOUNTER — Ambulatory Visit: Payer: Medicaid Other

## 2021-03-23 DIAGNOSIS — R2681 Unsteadiness on feet: Secondary | ICD-10-CM

## 2021-03-23 DIAGNOSIS — R296 Repeated falls: Secondary | ICD-10-CM

## 2021-03-23 DIAGNOSIS — M6281 Muscle weakness (generalized): Secondary | ICD-10-CM

## 2021-03-23 DIAGNOSIS — R2689 Other abnormalities of gait and mobility: Secondary | ICD-10-CM

## 2021-03-23 NOTE — Therapy (Signed)
OUTPATIENT PHYSICAL THERAPY TREATMENT NOTE   Patient Name: Ryan Jenkins MRN: [id number] DOB:February 24, 1960, 61 y.o., male Today's Date: 03/23/2021  PCP: Armanda Heritage, NP REFERRING PROVIDER: Edwin Dada,*   PT End of Session - 03/23/21 1523     Visit Number 3    Date for PT Re-Evaluation 05/10/21    Authorization Type [employer]    Authorization Time Period request authorization after 12th visit    Authorization - Visit Number 2    Authorization - Number of Visits 12    PT Start Time 7353    PT Stop Time 1606    PT Time Calculation (min) 41 min    Equipment Utilized During Treatment Gait belt    Activity Tolerance Patient tolerated treatment well    Behavior During Therapy WFL for tasks assessed/performed              Past Medical History:  Diagnosis Date   Anemia    Anxiety    BPH (benign prostatic hyperplasia)    CAD (coronary artery disease)    Chronic diastolic CHF (congestive heart failure) ([place])    Chronic kidney disease, stage IV (severe) ([place])    Depression    Diabetes mellitus with complication ([place])    Diabetic neuropathy ([place])    GERD (gastroesophageal reflux disease)    Hypertension    Retinopathy due to secondary diabetes ([place])    Sleep disturbances 11/25/2018   Vision changes 11/25/2018   Vitamin B12 deficiency    Vitreous hemorrhage ([place])    Past Surgical History:  Procedure Laterality Date   CATARACT EXTRACTION Bilateral 2017   Patient Active Problem List   Diagnosis Date Noted   Coronary artery disease 02/07/2021   Acute renal failure superimposed on stage 4 chronic kidney disease ([place])    Acute respiratory failure with hypoxia ([place]) 02/01/2021   Acute on chronic congestive heart failure ([place]) 01/31/2021   Acute CHF (congestive heart failure) ([place]) 08/03/2020   CKD (chronic kidney disease) stage 4, GFR 15-29 ml/min ([place]) 07/07/2020   Chest pain 07/07/2020   Acute on chronic diastolic CHF (congestive heart failure) ([place])  07/07/2020   Erectile dysfunction associated with type 2 diabetes mellitus ([place]) 08/07/2019   Hives 08/07/2019   Vitreous hemorrhage of left eye ([place]) 07/22/2019   Elevated troponin 07/22/2019   Vision loss of left eye 07/22/2019   History of medication noncompliance 04/02/2019   Gastric ulcer without hemorrhage or perforation 12/25/2018   CKD (chronic kidney disease), stage III ([place]) 12/25/2018   Gastroesophageal reflux disease without esophagitis 09/04/2018   Diabetic retinopathy of both eyes associated with type 2 diabetes mellitus ([place]) 01/03/2018   Intermittent diarrhea 09/27/2017   Gastroparesis 09/27/2017   Macroalbuminuric diabetic nephropathy ([place]) 06/25/2017   History of falling 06/25/2017   Hyperlipidemia 03/21/2017   Vitamin B 12 deficiency 03/21/2017   Type 2 diabetes mellitus with peripheral neuropathy ([place]) 02/05/2017   Essential hypertension 02/05/2017   Depression 02/05/2017   Unintended weight loss 02/05/2017   Gait disturbance 02/05/2017   Pronation deformity of both feet 05/11/2014   Diabetic neuropathy, type II diabetes mellitus ([place]) 05/11/2014   Metatarsal deformity 05/11/2014    REFERRING DIAG: Unsteady gait [R26.81]  THERAPY DIAG:  Muscle weakness  Other abnormalities of gait and mobility  Unsteadiness on feet  Repeated falls  PERTINENT HISTORY: CHF, diabetes, peripheral neuropathy in feet and hands (no feeling), Fall risk, stage 4 CKD  PRECAUTIONS/RESTRICTIONS:   Fall, CHF (no recumbent positions), peripheral neuropathy, MONITOR VITALS  SUBJECTIVE: Patient reports that he is in some pain today and that both of his ankles have been swollen. He has spoken to his kidney doctor who is supposed to get him some more Lasix this week.   Pain:  Are you having pain? Yes Right foot 5/10 Aching Bilateral feet swollen  OBJECTIVE:  LE MMT:   MMT Right 03/15/2021 Left 03/15/2021  Hip flexion (L2, L3) 2+ 2+  Knee extension (L3) 3 3  Knee flexion 3  3+  Hip abduction 3 3  Hip extension D (STS) D (STS)  Hip external rotation      Hip internal rotation      Hip adduction      Ankle dorsiflexion (L4) Unable to bil heel raise Unable to bil heel raise  Ankle plantarflexion (S1)      Ankle inversion      Ankle eversion      Great Toe ext (L5)      Grossly        (Blank rows = not tested, score listed is out of 5 possible points.  N = WNL, D = diminished, C = clear for gross weakness with myotome testing, * = concordant pain with testing)   LE ROM:   ROM Right 03/15/2021 Left 03/15/2021  Hip flexion      Hip extension      Hip abduction      Hip adduction      Hip internal rotation      Hip external rotation      Knee flexion      Knee extension      Ankle dorsiflexion      Ankle plantarflexion      Ankle inversion      Ankle eversion        (Blank rows = not tested, N = WNL, * = concordant pain with testing)   TREATMENT  [facility] Adult PT Treatment:                                                DATE: 03/23/2021 Therapeutic Exercise: Nustep level 3 x 5 min while gathering subjective Standing heel raises 3x10 partial ROM Standing march 3x20 STS, no UE, minA, 2x5 from 68 cm table Seated march 5# x20 Seated hamstring curl GTB x10 BIL, BlueTB x10 BIL LAQ 5# 3x10 Seated clamshell GTB 3x10 Seated hip adduction 5" hold 2x10   03/21/21:  Therapeutic Exercise: - nu-step L3 3mwhile taking subjective and planning session with patient - standing heel raise 3x10 partial ROM - standing march - 3x20 - standing HS curl - 3x20 - STS - no UE - minA - 2x5 68 cm - LAQ - 5# - 3x10   ASTERISK SIGNS     Asterisk Signs  Eval            LE gross MMT              2 MWT 160'            30'' STS 2x                                              HOME EXERCISE PROGRAM: Access Code: [email or web address]: [email or web address] Date: 03/15/2021 Prepared by:  Reeves Reinhartsen   Exercises Seated Hip Abduction with Resistance - 1 x  daily - 7 x weekly - 3 sets - 10 reps Seated March with Resistance - 1 x daily - 7 x weekly - 3 sets - 10 reps Seated Long Arc Quad - 1 x daily - 7 x weekly - 3 sets - 10 reps Seated Hip Adduction Isometrics with Ball - 1 x daily - 7 x weekly - 1 sets - 10 reps - 10 hold Seated Hamstring Curl with Anchored Resistance - 1 x daily - 7 x weekly - 3 sets - 10 reps     ASSESSMENT:   CLINICAL IMPRESSION: Patient presents to PT with bilateral ankle swelling and moderate pain. Today's session focused on bilateral lower extremity strengthening and increasing his overall activity tolerance. He has moderate fatigue today throughout session, needing frequent rest breaks. CGA and minA provided as needed. Patient continues to benefit from skilled PT services and should be progressed as able to improve functional independence.     OBJECTIVE IMPAIRMENTS: gait, balance, endurance, global strength   ACTIVITY LIMITATIONS: walking, standing, transfers, bending, lifting, shopping   PERSONAL FACTORS: See medical history and pertinent history     REHAB POTENTIAL: Fair chronic condition with complex medical history     CLINICAL DECISION MAKING: Stable/uncomplicated   EVALUATION COMPLEXITY: Low     GOALS:   SHORT TERM GOALS:   STG Name Target Date Goal status  1 Abby will be >75% HEP compliant to improve carryover between sessions and facilitate independent management of condition   Baseline: No HEP 04/05/2021 INITIAL    LONG TERM GOALS:    LTG Name Target Date Goal status  1 [place] will improve the following MMTs to >/= 4/5 to show improvement in strength:  LE grossly     Baseline: see chart 05/10/2021 INITIAL  2 [place] will improve 30'' STS (MCID 2) to >/= 5x (w/ UE?: Y) to show improved LE strength and improved transfers    Baseline: 2x  w/ UE? Y 05/10/2021 INITIAL  3 [place] will improve 10 meter max gait speed to .70 m/s (.1 m/s MCID) to show functional improvement in ambulation     Eval: .5 m/s   Norms:    05/10/2021 INITIAL  4 Marguerite will improve two minute walk test to 250 feet (MCID 40 ft).   Baseline: 160' 05/10/2021 INITIAL    PLAN: PT FREQUENCY: 1-2x/week   PT DURATION: 8 weeks (Ending 05/10/2021)   PLANNED INTERVENTIONS: Therapeutic exercises, Aquatic therapy, Therapeutic activity, Neuro Muscular re-education, Gait training, Patient/Family education, Joint mobilization, Dry Needling, Electrical stimulation, Spinal mobilization and/or manipulation, Moist heat, Taping, Vasopneumatic device, Ionotophoresis '4mg'$ /ml Dexamethasone, and Manual therapy   PLAN FOR NEXT SESSION: progressive LE, gait, balance    Evelene Croon PTA 03/23/2021, 4:07 PM

## 2021-03-28 ENCOUNTER — Ambulatory Visit: Payer: Medicaid Other | Admitting: Physical Therapy

## 2021-03-30 ENCOUNTER — Ambulatory Visit: Payer: Medicaid Other

## 2021-03-30 ENCOUNTER — Other Ambulatory Visit: Payer: Self-pay

## 2021-03-30 DIAGNOSIS — M6281 Muscle weakness (generalized): Secondary | ICD-10-CM | POA: Diagnosis not present

## 2021-03-30 NOTE — Therapy (Signed)
?OUTPATIENT PHYSICAL THERAPY TREATMENT NOTE ? ? ?Patient Name: Ryan Jenkins ?MRN: [id number] ?DOB:August 29, 1960, 61 y.o., male ?Today's Date: 03/30/2021 ? ?PCP: Armanda Heritage, NP ?REFERRING PROVIDER: Edwin Dada,* ? ? ? ? ? ?Past Medical History:  ?Diagnosis Date  ? Anemia   ? Anxiety   ? BPH (benign prostatic hyperplasia)   ? CAD (coronary artery disease)   ? Chronic diastolic CHF (congestive heart failure) ([place])   ? Chronic kidney disease, stage IV (severe) ([place])   ? Depression   ? Diabetes mellitus with complication ([place])   ? Diabetic neuropathy ([place])   ? GERD (gastroesophageal reflux disease)   ? Hypertension   ? Retinopathy due to secondary diabetes ([place])   ? Sleep disturbances 11/25/2018  ? Vision changes 11/25/2018  ? Vitamin B12 deficiency   ? Vitreous hemorrhage ([place])   ? ?Past Surgical History:  ?Procedure Laterality Date  ? CATARACT EXTRACTION Bilateral 2017  ? ?Patient Active Problem List  ? Diagnosis Date Noted  ? Coronary artery disease 02/07/2021  ? Acute renal failure superimposed on stage 4 chronic kidney disease ([place])   ? Acute respiratory failure with hypoxia ([place]) 02/01/2021  ? Acute on chronic congestive heart failure ([place]) 01/31/2021  ? Acute CHF (congestive heart failure) ([place]) 08/03/2020  ? CKD (chronic kidney disease) stage 4, GFR 15-29 ml/min ([facility]) 07/07/2020  ? Chest pain 07/07/2020  ? Acute on chronic diastolic CHF (congestive heart failure) ([place]) 07/07/2020  ? Erectile dysfunction associated with type 2 diabetes mellitus ([place]) 08/07/2019  ? Hives 08/07/2019  ? Vitreous hemorrhage of left eye ([place]) 07/22/2019  ? Elevated troponin 07/22/2019  ? Vision loss of left eye 07/22/2019  ? History of medication noncompliance 04/02/2019  ? Gastric ulcer without hemorrhage or perforation 12/25/2018  ? CKD (chronic kidney disease), stage III ([place]) 12/25/2018  ? Gastroesophageal reflux disease without esophagitis 09/04/2018  ? Diabetic retinopathy of both eyes associated with type 2  diabetes mellitus ([place]) 01/03/2018  ? Intermittent diarrhea 09/27/2017  ? Gastroparesis 09/27/2017  ? Macroalbuminuric diabetic nephropathy ([place]) 06/25/2017  ? History of falling 06/25/2017  ? Hyperlipidemia 03/21/2017  ? Vitamin B 12 deficiency 03/21/2017  ? Type 2 diabetes mellitus with peripheral neuropathy ([place]) 02/05/2017  ? Essential hypertension 02/05/2017  ? Depression 02/05/2017  ? Unintended weight loss 02/05/2017  ? Gait disturbance 02/05/2017  ? Pronation deformity of both feet 05/11/2014  ? Diabetic neuropathy, type II diabetes mellitus ([place]) 05/11/2014  ? Metatarsal deformity 05/11/2014  ? ? ?REFERRING DIAG: Unsteady gait [R26.81] ? ?THERAPY DIAG:  ?No diagnosis found. ? ?PERTINENT HISTORY: CHF, diabetes, peripheral neuropathy in feet and hands (no feeling), Fall risk, stage 4 CKD ? ?PRECAUTIONS/RESTRICTIONS:  ? ?Fall, CHF (no recumbent positions), peripheral neuropathy, MONITOR VITALS ? ?SUBJECTIVE: Patient reports he is feeling a bit weak today in his legs. ? ?Pain:  ?Are you having pain? No  ?Right foot ?0/10 ?Aching ?Bilateral feet swollen ? ?OBJECTIVE: ? ?LE MMT: ?  ?MMT Right ?03/15/2021 Left ?03/15/2021  ?Hip flexion (L2, L3) 2+ 2+  ?Knee extension (L3) 3 3  ?Knee flexion 3 3+  ?Hip abduction 3 3  ?Hip extension D (STS) D (STS)  ?Hip external rotation      ?Hip internal rotation      ?Hip adduction      ?Ankle dorsiflexion (L4) Unable to bil heel raise Unable to bil heel raise  ?Ankle plantarflexion (S1)      ?Ankle inversion      ?Ankle eversion      ?  Great Toe ext (L5)      ?Grossly      ?  ?(Blank rows = not tested, score listed is out of 5 possible points.  N = WNL, D = diminished, C = clear for gross weakness with myotome testing, * = concordant pain with testing) ?  ?LE ROM: ?  ?ROM Right ?03/15/2021 Left ?03/15/2021  ?Hip flexion      ?Hip extension      ?Hip abduction      ?Hip adduction      ?Hip internal rotation      ?Hip external rotation      ?Knee flexion      ?Knee extension      ?Ankle  dorsiflexion      ?Ankle plantarflexion      ?Ankle inversion      ?Ankle eversion      ?  ?(Blank rows = not tested, N = WNL, * = concordant pain with testing) ? ? ?TREATMENT  ?[facility] Adult PT Treatment:                                                DATE: 03/30/2021 ?Therapeutic Exercise: ?Nustep level 3 x 5 min while gathering subjective ?Standing heel raises 3x10 partial ROM ?Standing march 3x20 ?STS, no UE, minA, 2x5 from 64 cm table (no assist needed) ?Seated march 5# 2x20 ?Seated hamstring curl BlueTB 2x10 BIL (blackTB next session) ?LAQ 5# 3x10 ?Seated clamshell BlueTB 3x10 ?Seated hip adduction 5" hold 3x10 ? ? ?[facility] Adult PT Treatment:                                                DATE: 03/23/2021 ?Therapeutic Exercise: ?Nustep level 3 x 5 min while gathering subjective ?Standing heel raises 3x10 partial ROM ?Standing march 3x20 ?STS, no UE, minA, 2x5 from 68 cm table ?Seated march 5# x20 ?Seated hamstring curl GTB x10 BIL, BlueTB x10 BIL ?LAQ 5# 3x10 ?Seated clamshell GTB 3x10 ?Seated hip adduction 5" hold 2x10 ? ? ?03/21/21: ? ?Therapeutic Exercise: ?- nu-step L3 39mwhile taking subjective and planning session with patient ?- standing heel raise 3x10 partial ROM ?- standing march - 3x20 ?- standing HS curl - 3x20 ?- STS - no UE - minA - 2x5 68 cm ?- LAQ - 5# - 3x10 ? ? ?ASTERISK SIGNS ?  ?  ?Asterisk Signs  Eval            ?LE gross MMT              ?2 MWT 160'            ?325' STS 2x            ?               ?               ?  ?  ?HOME EXERCISE PROGRAM: ?Access Code: [email or web address] ?URL: [email or web address] ?Date: 03/15/2021 ?Prepared by: KShearon Balo?  ?Exercises ?Seated Hip Abduction with Resistance - 1 x daily - 7 x weekly - 3 sets - 10 reps ?Seated March with Resistance - 1 x daily - 7 x weekly - 3 sets - 10 reps ?  Seated Long Arc Quad - 1 x daily - 7 x weekly - 3 sets - 10 reps ?Seated Hip Adduction Isometrics with Ball - 1 x daily - 7 x weekly - 1 sets - 10 reps - 10 hold ?Seated  Hamstring Curl with Anchored Resistance - 1 x daily - 7 x weekly - 3 sets - 10 reps ?  ?  ?ASSESSMENT: ?  ?CLINICAL IMPRESSION: ?Patient presents to PT with improved swelling in LE citing Lasix and compression stockings helping. Today's session focused on bilateral lower extremity strengthening and increasing overall activity tolerance. He did not require CGA or minA today with STS. Patient continues to benefit from skilled PT services and should be progressed as able to improve functional independence. ? ?  ?OBJECTIVE IMPAIRMENTS: gait, balance, endurance, global strength ?  ?ACTIVITY LIMITATIONS: walking, standing, transfers, bending, lifting, shopping ?  ?PERSONAL FACTORS: See medical history and pertinent history ?  ?  ?REHAB POTENTIAL: Fair chronic condition with complex medical history   ?  ?CLINICAL DECISION MAKING: Stable/uncomplicated ?  ?EVALUATION COMPLEXITY: Low ?  ?  ?GOALS: ?  ?SHORT TERM GOALS: ?  ?STG Name Target Date Goal status  ?[place] will be >75% HEP compliant to improve carryover between sessions and facilitate independent management of condition ?  ?Baseline: No HEP 04/05/2021 INITIAL  ?  ?LONG TERM GOALS:  ?  ?LTG Name Target Date Goal status  ?[place] will improve the following MMTs to >/= 4/5 to show improvement in strength:  LE grossly   ?  ?Baseline: see chart 05/10/2021 INITIAL  ?2 [place] will improve 30'' STS (MCID 2) to >/= 5x (w/ UE?: Y) to show improved LE strength and improved transfers  ?  ?Baseline: 2x  w/ UE? Y 05/10/2021 INITIAL  ?3 Javad will improve 10 meter max gait speed to .70 m/s (.1 m/s MCID) to show functional improvement in ambulation  ?  ?Eval: .5 m/s ?  ?[place]: ?  ? 05/10/2021 INITIAL  ?[place] will improve two minute walk test to 250 feet (MCID 40 ft). ?  ?Baseline: 160' 05/10/2021 INITIAL  ?  ?PLAN: ?PT FREQUENCY: 1-2x/week ?  ?PT DURATION: 8 weeks (Ending 05/10/2021) ?  ?PLANNED INTERVENTIONS: Therapeutic exercises, Aquatic therapy, Therapeutic activity, Neuro  Muscular re-education, Gait training, Patient/Family education, Joint mobilization, Dry Needling, Electrical stimulation, Spinal mobilization and/or manipulation, Moist heat, Taping, Vasopneumatic device, Ionotopho

## 2021-04-04 ENCOUNTER — Encounter: Payer: Self-pay | Admitting: Physical Therapy

## 2021-04-04 ENCOUNTER — Other Ambulatory Visit: Payer: Self-pay

## 2021-04-04 ENCOUNTER — Ambulatory Visit: Payer: Medicaid Other | Admitting: Physical Therapy

## 2021-04-04 DIAGNOSIS — R2689 Other abnormalities of gait and mobility: Secondary | ICD-10-CM

## 2021-04-04 DIAGNOSIS — R2681 Unsteadiness on feet: Secondary | ICD-10-CM

## 2021-04-04 DIAGNOSIS — M6281 Muscle weakness (generalized): Secondary | ICD-10-CM | POA: Diagnosis not present

## 2021-04-04 DIAGNOSIS — R296 Repeated falls: Secondary | ICD-10-CM

## 2021-04-04 NOTE — Therapy (Signed)
?OUTPATIENT PHYSICAL THERAPY TREATMENT NOTE ? ? ?Patient Name: Ryan Jenkins ?MRN: [id number] ?DOB:1960/06/22, 61 y.o., male ?Today's Date: 04/04/2021 ? ?PCP: Armanda Heritage, NP ?REFERRING PROVIDER: Armanda Heritage, NP ? ? PT End of Session - 04/04/21 1531   ? ? Visit Number 5   ? Date for PT Re-Evaluation 05/10/21   ? Authorization Type [employer]   ? Authorization Time Period request authorization after 12th visit   ? Authorization - Visit Number 3   ? Authorization - Number of Visits 12   ? PT Start Time [phone]   ? PT Stop Time 0413   ? PT Time Calculation (min) 42 min   ? Equipment Utilized During Treatment Gait belt   ? Activity Tolerance Patient tolerated treatment well   ? Behavior During Therapy [facility] for tasks assessed/performed   ? ?  ?  ? ?  ? ? ? ? ?Past Medical History:  ?Diagnosis Date  ? Anemia   ? Anxiety   ? BPH (benign prostatic hyperplasia)   ? CAD (coronary artery disease)   ? Chronic diastolic CHF (congestive heart failure) ([place])   ? Chronic kidney disease, stage IV (severe) ([place])   ? Depression   ? Diabetes mellitus with complication ([place])   ? Diabetic neuropathy ([place])   ? GERD (gastroesophageal reflux disease)   ? Hypertension   ? Retinopathy due to secondary diabetes ([place])   ? Sleep disturbances 11/25/2018  ? Vision changes 11/25/2018  ? Vitamin B12 deficiency   ? Vitreous hemorrhage ([place])   ? ?Past Surgical History:  ?Procedure Laterality Date  ? CATARACT EXTRACTION Bilateral 2017  ? ?Patient Active Problem List  ? Diagnosis Date Noted  ? Coronary artery disease 02/07/2021  ? Acute renal failure superimposed on stage 4 chronic kidney disease ([place])   ? Acute respiratory failure with hypoxia ([place]) 02/01/2021  ? Acute on chronic congestive heart failure ([place]) 01/31/2021  ? Acute CHF (congestive heart failure) ([place]) 08/03/2020  ? CKD (chronic kidney disease) stage 4, GFR 15-29 ml/min (HCC) 07/07/2020  ? Chest pain 07/07/2020  ? Acute on chronic diastolic CHF (congestive heart failure) ([place])  07/07/2020  ? Erectile dysfunction associated with type 2 diabetes mellitus ([place]) 08/07/2019  ? Hives 08/07/2019  ? Vitreous hemorrhage of left eye ([place]) 07/22/2019  ? Elevated troponin 07/22/2019  ? Vision loss of left eye 07/22/2019  ? History of medication noncompliance 04/02/2019  ? Gastric ulcer without hemorrhage or perforation 12/25/2018  ? CKD (chronic kidney disease), stage III ([place]) 12/25/2018  ? Gastroesophageal reflux disease without esophagitis 09/04/2018  ? Diabetic retinopathy of both eyes associated with type 2 diabetes mellitus ([place]) 01/03/2018  ? Intermittent diarrhea 09/27/2017  ? Gastroparesis 09/27/2017  ? Macroalbuminuric diabetic nephropathy ([place]) 06/25/2017  ? History of falling 06/25/2017  ? Hyperlipidemia 03/21/2017  ? Vitamin B 12 deficiency 03/21/2017  ? Type 2 diabetes mellitus with peripheral neuropathy ([place]) 02/05/2017  ? Essential hypertension 02/05/2017  ? Depression 02/05/2017  ? Unintended weight loss 02/05/2017  ? Gait disturbance 02/05/2017  ? Pronation deformity of both feet 05/11/2014  ? Diabetic neuropathy, type II diabetes mellitus ([place]) 05/11/2014  ? Metatarsal deformity 05/11/2014  ? ? ?REFERRING DIAG: Unsteady gait [R26.81] ? ?THERAPY DIAG:  ?Unsteadiness on feet ? ?Other abnormalities of gait and mobility ? ?Muscle weakness ? ?Repeated falls ? ?PERTINENT HISTORY: CHF, diabetes, peripheral neuropathy in feet and hands (no feeling), Fall risk, stage 4 CKD ? ?PRECAUTIONS/RESTRICTIONS:  ? ?Fall, CHF (no recumbent positions), peripheral neuropathy, MONITOR  VITALS ? ?SUBJECTIVE:  ? ?Pt reports HEP compliance.  He states that his exercises are relatively easy now. ? ?Pain:  ?Are you having pain? No  ?Right foot ?0/10 ?Aching ?Bilateral feet swollen ? ?OBJECTIVE: ? ?LE MMT: ?  ?MMT Right ?03/15/2021 Left ?03/15/2021  ?Hip flexion (L2, L3) 2+ 2+  ?Knee extension (L3) 3 3  ?Knee flexion 3 3+  ?Hip abduction 3 3  ?Hip extension D (STS) D (STS)  ?Hip external rotation      ?Hip internal  rotation      ?Hip adduction      ?Ankle dorsiflexion (L4) Unable to bil heel raise Unable to bil heel raise  ?Ankle plantarflexion (S1)      ?Ankle inversion      ?Ankle eversion      ?Great Toe ext (L5)      ?Grossly      ?  ?(Blank rows = not tested, score listed is out of 5 possible points.  N = WNL, D = diminished, C = clear for gross weakness with myotome testing, * = concordant pain with testing) ?  ?LE ROM: ?  ?ROM Right ?03/15/2021 Left ?03/15/2021  ?Hip flexion      ?Hip extension      ?Hip abduction      ?Hip adduction      ?Hip internal rotation      ?Hip external rotation      ?Knee flexion      ?Knee extension      ?Ankle dorsiflexion      ?Ankle plantarflexion      ?Ankle inversion      ?Ankle eversion      ?  ?(Blank rows = not tested, N = WNL, * = concordant pain with testing) ? ?ASTERISK SIGNS ?  ?  ?Asterisk Signs  Eval  3/21          ?LE gross MMT              ?2 MWT 160'  198'          ?6'' STS 2x            ? balance              ?               ?  ?  ?HOME EXERCISE PROGRAM: ?Access Code: [email or web address] ?URL: [email or web address] ?Date: 03/15/2021 ?Prepared by: Shearon Balo ?  ?Exercises ?Seated Hip Abduction with Resistance - 1 x daily - 7 x weekly - 3 sets - 10 reps ?Seated March with Resistance - 1 x daily - 7 x weekly - 3 sets - 10 reps ?Seated Long Arc Quad - 1 x daily - 7 x weekly - 3 sets - 10 reps ?Seated Hip Adduction Isometrics with Ball - 1 x daily - 7 x weekly - 1 sets - 10 reps - 10 hold ?Seated Hamstring Curl with Anchored Resistance - 1 x daily - 7 x weekly - 3 sets - 10 reps ? ? ?TREATMENT  ? ?[facility] Adult PT Treatment: DATE: 04/04/2021 ?Therapeutic Exercise: ?Nustep level 5 x 5 min while gathering subjective ?Standing heel raises 3x10 partial ROM ?Walking 2' for endurance ?Standing march 3x20 ?STS, no UE, 2x5 from 64 cm table (no assist needed) ?LAQ 7.5# 2x10 ?Standing hip adduction 1x20, 1x10 ? ?[facility] Adult PT Treatment:  DATE: 03/30/2021 ?Therapeutic Exercise: ?Nustep level 3 x 5 min while gathering subjective ?Standing heel raises 3x10 partial ROM ?Standing march 3x20 ?STS, no UE, minA, 2x5 from 64 cm table (no assist needed) ?Seated march 5# 2x20 ?Seated hamstring curl BlueTB 2x10 BIL (blackTB next session) ?LAQ 5# 3x10 ?Seated clamshell BlueTB 3x10 ?Seated hip adduction 5" hold 3x10 ? ? ?[facility] Adult PT Treatment:                                                DATE: 03/23/2021 ?Therapeutic Exercise: ?Nustep level 3 x 5 min while gathering subjective ?Standing heel raises 3x10 partial ROM ?Standing march 3x20 ?STS, no UE, minA, 2x5 from 68 cm table ?Seated march 5# x20 ?Seated hamstring curl GTB x10 BIL, BlueTB x10 BIL ?LAQ 5# 3x10 ?Seated clamshell GTB 3x10 ?Seated hip adduction 5" hold 2x10 ? ? ?03/21/21: ? ?Therapeutic Exercise: ?- nu-step L3 58mwhile taking subjective and planning session with patient ?- standing heel raise 3x10 partial ROM ?- standing march - 3x20 ?- standing HS curl - 3x20 ?- STS - no UE - minA - 2x5 68 cm ?- LAQ - # - 3x10 ? ? ? ?  ?  ?ASSESSMENT: ?  ?CLINICAL IMPRESSION: ?Pt is slowly progressing toward goals.  Working toward more standing activities.  He fatigues rapidly, but is able to tolerate significantly more than when starting therapy.  2 MWT improved by ~23%. ? ?  ?OBJECTIVE IMPAIRMENTS: gait, balance, endurance, global strength ?  ?ACTIVITY LIMITATIONS: walking, standing, transfers, bending, lifting, shopping ?  ?PERSONAL FACTORS: See medical history and pertinent history ?  ?  ?REHAB POTENTIAL: Fair chronic condition with complex medical history   ?  ?CLINICAL DECISION MAKING: Stable/uncomplicated ?  ?EVALUATION COMPLEXITY: Low ?  ?  ?GOALS: ?  ?SHORT TERM GOALS: ?  ?STG Name Target Date Goal status  ?1[place] be >75% HEP compliant to improve carryover between sessions and facilitate independent management of condition ?  ?Baseline: No HEP 04/05/2021 MET 3/21  ?  ?LONG TERM GOALS:  ?  ?LTG Name  Target Date Goal status  ?1[place] improve the following MMTs to >/= 4/5 to show improvement in strength:  LE grossly   ?  ?Baseline: see chart 05/10/2021 INITIAL  ?2 CT[place]will improve 30'' STS (MCID

## 2021-04-06 ENCOUNTER — Other Ambulatory Visit: Payer: Self-pay

## 2021-04-06 ENCOUNTER — Encounter: Payer: Self-pay | Admitting: Physical Therapy

## 2021-04-06 ENCOUNTER — Ambulatory Visit: Payer: Medicaid Other | Admitting: Physical Therapy

## 2021-04-06 DIAGNOSIS — R296 Repeated falls: Secondary | ICD-10-CM

## 2021-04-06 DIAGNOSIS — M6281 Muscle weakness (generalized): Secondary | ICD-10-CM | POA: Diagnosis not present

## 2021-04-06 DIAGNOSIS — R2689 Other abnormalities of gait and mobility: Secondary | ICD-10-CM

## 2021-04-06 DIAGNOSIS — R2681 Unsteadiness on feet: Secondary | ICD-10-CM

## 2021-04-06 NOTE — Therapy (Signed)
?OUTPATIENT PHYSICAL THERAPY TREATMENT NOTE ? ? ?Patient Name: Ryan Jenkins ?MRN: [id number] ?DOB:1960-02-13, 61 y.o., male ?Today's Date: 04/06/2021 ? ?PCP: Armanda Heritage, NP ?REFERRING PROVIDER: Edwin Dada,* ? ? PT End of Session - 04/06/21 1532   ? ? Visit Number 6   ? Number of Visits 12   ? Date for PT Re-Evaluation 05/10/21   ? Authorization Type [employer]   ? Authorization Time Period request authorization after 12th visit   ? Authorization - Number of Visits 12   ? PT Start Time 2119   ? PT Stop Time 4174   ? PT Time Calculation (min) 43 min   ? Equipment Utilized During Treatment Gait belt   ? Activity Tolerance Patient tolerated treatment well   ? Behavior During Therapy [facility] for tasks assessed/performed   ? ?  ?  ? ?  ? ? ? ? ?Past Medical History:  ?Diagnosis Date  ? Anemia   ? Anxiety   ? BPH (benign prostatic hyperplasia)   ? CAD (coronary artery disease)   ? Chronic diastolic CHF (congestive heart failure) ([place])   ? Chronic kidney disease, stage IV (severe) ([place])   ? Depression   ? Diabetes mellitus with complication ([place])   ? Diabetic neuropathy ([place])   ? GERD (gastroesophageal reflux disease)   ? Hypertension   ? Retinopathy due to secondary diabetes ([place])   ? Sleep disturbances 11/25/2018  ? Vision changes 11/25/2018  ? Vitamin B12 deficiency   ? Vitreous hemorrhage ([place])   ? ?Past Surgical History:  ?Procedure Laterality Date  ? CATARACT EXTRACTION Bilateral 2017  ? ?Patient Active Problem List  ? Diagnosis Date Noted  ? Coronary artery disease 02/07/2021  ? Acute renal failure superimposed on stage 4 chronic kidney disease ([place])   ? Acute respiratory failure with hypoxia ([place]) 02/01/2021  ? Acute on chronic congestive heart failure ([place]) 01/31/2021  ? Acute CHF (congestive heart failure) ([place]) 08/03/2020  ? CKD (chronic kidney disease) stage 4, GFR 15-29 ml/min (HCC) 07/07/2020  ? Chest pain 07/07/2020  ? Acute on chronic diastolic CHF (congestive heart failure) ([place])  07/07/2020  ? Erectile dysfunction associated with type 2 diabetes mellitus ([place]) 08/07/2019  ? Hives 08/07/2019  ? Vitreous hemorrhage of left eye ([place]) 07/22/2019  ? Elevated troponin 07/22/2019  ? Vision loss of left eye 07/22/2019  ? History of medication noncompliance 04/02/2019  ? Gastric ulcer without hemorrhage or perforation 12/25/2018  ? CKD (chronic kidney disease), stage III ([place]) 12/25/2018  ? Gastroesophageal reflux disease without esophagitis 09/04/2018  ? Diabetic retinopathy of both eyes associated with type 2 diabetes mellitus ([place]) 01/03/2018  ? Intermittent diarrhea 09/27/2017  ? Gastroparesis 09/27/2017  ? Macroalbuminuric diabetic nephropathy ([place]) 06/25/2017  ? History of falling 06/25/2017  ? Hyperlipidemia 03/21/2017  ? Vitamin B 12 deficiency 03/21/2017  ? Type 2 diabetes mellitus with peripheral neuropathy ([place]) 02/05/2017  ? Essential hypertension 02/05/2017  ? Depression 02/05/2017  ? Unintended weight loss 02/05/2017  ? Gait disturbance 02/05/2017  ? Pronation deformity of both feet 05/11/2014  ? Diabetic neuropathy, type II diabetes mellitus ([place]) 05/11/2014  ? Metatarsal deformity 05/11/2014  ? ? ?REFERRING DIAG: Unsteady gait [R26.81] ? ?THERAPY DIAG:  ?Unsteadiness on feet ? ?Other abnormalities of gait and mobility ? ?Muscle weakness ? ?Repeated falls ? ?PERTINENT HISTORY: CHF, diabetes, peripheral neuropathy in feet and hands (no feeling), Fall risk, stage 4 CKD ? ?PRECAUTIONS/RESTRICTIONS:  ? ?Fall, CHF (no recumbent positions), peripheral neuropathy, MONITOR VITALS ? ?  SUBJECTIVE:  ? ?Pt reports that he was tired after last session.  He reports his legs felt heavy. ? ?Pain:  ?Are you having pain? No  ?Right foot ?0/10 ?Aching ?Bilateral feet swollen ? ?OBJECTIVE: ? ?LE MMT: ?  ?MMT Right ?03/15/2021 Left ?03/15/2021  ?Hip flexion (L2, L3) 2+ 2+  ?Knee extension (L3) 3 3  ?Knee flexion 3 3+  ?Hip abduction 3 3  ?Hip extension D (STS) D (STS)  ?Hip external rotation      ?Hip internal  rotation      ?Hip adduction      ?Ankle dorsiflexion (L4) Unable to bil heel raise Unable to bil heel raise  ?Ankle plantarflexion (S1)      ?Ankle inversion      ?Ankle eversion      ?Great Toe ext (L5)      ?Grossly      ?  ?(Blank rows = not tested, score listed is out of 5 possible points.  N = WNL, D = diminished, C = clear for gross weakness with myotome testing, * = concordant pain with testing) ?  ?LE ROM: ?  ?ROM Right ?03/15/2021 Left ?03/15/2021  ?Hip flexion      ?Hip extension      ?Hip abduction      ?Hip adduction      ?Hip internal rotation      ?Hip external rotation      ?Knee flexion      ?Knee extension      ?Ankle dorsiflexion      ?Ankle plantarflexion      ?Ankle inversion      ?Ankle eversion      ?  ?(Blank rows = not tested, N = WNL, * = concordant pain with testing) ? ?ASTERISK SIGNS ?  ?  ?Asterisk Signs  Eval  3/21          ?LE gross MMT              ?2 MWT 160'  198'          ?62'' STS 2x            ? balance              ?               ?  ?  ?HOME EXERCISE PROGRAM: ?Access Code: [email or web address] ?URL: [email or web address] ?Date: 03/15/2021 ?Prepared by: Shearon Balo ?  ?Exercises ?Seated Hip Abduction with Resistance - 1 x daily - 7 x weekly - 3 sets - 10 reps ?Seated March with Resistance - 1 x daily - 7 x weekly - 3 sets - 10 reps ?Seated Long Arc Quad - 1 x daily - 7 x weekly - 3 sets - 10 reps ?Seated Hip Adduction Isometrics with Ball - 1 x daily - 7 x weekly - 1 sets - 10 reps - 10 hold ?Seated Hamstring Curl with Anchored Resistance - 1 x daily - 7 x weekly - 3 sets - 10 reps ? ? ?TREATMENT  ? ?[facility] Adult PT Treatment: DATE: 04/06/2021 ?Therapeutic Exercise: ?Recumbent bike - 5 min L2 ?Standing heel raises 3x10 partial ROM ?Walking w/ SPC for endurance - 4 laps in // bars ?Step up 2'' - 5x ea with SPC in // (CGA to mod assist) ?Standing march 2x20 ?STS, no UE, 2x5 from 64 cm table (no assist needed) (NT) ?Knee ext machine - bil 2x10 @ 5# ?Standing hip abduction  2x20 ? ?[facility] Adult  PT Treatment: DATE: 04/04/2021 ?Therapeutic Exercise: ?Nustep level 5 x 5 min while gathering subjective ?Standing heel raises 3x10 partial ROM ?Walking 2' for endurance ?Standing march 3x20 ?STS, no UE, 2x5 from 64 cm table (no assist needed) ?LAQ 7.5# 2x10 ?Standing hip adduction 1x20, 1x10 ? ?[facility] Adult PT Treatment:                                                DATE: 03/30/2021 ?Therapeutic Exercise: ?Nustep level 3 x 5 min while gathering subjective ?Standing heel raises 3x10 partial ROM ?Standing march 3x20 ?STS, no UE, minA, 2x5 from 64 cm table (no assist needed) ?Seated march 5# 2x20 ?Seated hamstring curl BlueTB 2x10 BIL (blackTB next session) ?LAQ 5# 3x10 ?Seated clamshell BlueTB 3x10 ?Seated hip adduction 5" hold 3x10 ? ? ?[facility] Adult PT Treatment:                                                DATE: 03/23/2021 ?Therapeutic Exercise: ?Nustep level 3 x 5 min while gathering subjective ?Standing heel raises 3x10 partial ROM ?Standing march 3x20 ?STS, no UE, minA, 2x5 from 68 cm table ?Seated march 5# x20 ?Seated hamstring curl GTB x10 BIL, BlueTB x10 BIL ?LAQ 5# 3x10 ?Seated clamshell GTB 3x10 ?Seated hip adduction 5" hold 2x10 ? ? ?03/21/21: ? ?Therapeutic Exercise: ?- nu-step L3 61mwhile taking subjective and planning session with patient ?- standing heel raise 3x10 partial ROM ?- standing march - 3x20 ?- standing HS curl - 3x20 ?- STS - no UE - minA - 2x5 68 cm ?- LAQ - # - 3x10 ? ? ? ?  ?  ?ASSESSMENT: ?  ?CLINICAL IMPRESSION: ?CKhyleris doing well with therapy.  He is making slow progress toward goals.  Able to add in step up to 2'' step today.  He requires CGA to mod assist with this.  Working toward ambulation with SPC, but this requires close supervision. ? ?  ?OBJECTIVE IMPAIRMENTS: gait, balance, endurance, global strength ?  ?ACTIVITY LIMITATIONS: walking, standing, transfers, bending, lifting, shopping ?  ?PERSONAL FACTORS: See medical history and pertinent history ?  ?  ?REHAB  POTENTIAL: Fair chronic condition with complex medical history   ?  ?CLINICAL DECISION MAKING: Stable/uncomplicated ?  ?EVALUATION COMPLEXITY: Low ?  ?  ?GOALS: ?  ?SHORT TERM GOALS: ?  ?STG Name Target Date Goal statu

## 2021-04-10 ENCOUNTER — Ambulatory Visit: Payer: Medicaid Other

## 2021-04-13 ENCOUNTER — Ambulatory Visit: Payer: Medicaid Other

## 2021-04-18 ENCOUNTER — Ambulatory Visit: Payer: Medicaid Other | Attending: Family Medicine

## 2021-04-18 DIAGNOSIS — M6281 Muscle weakness (generalized): Secondary | ICD-10-CM | POA: Insufficient documentation

## 2021-04-18 DIAGNOSIS — R2681 Unsteadiness on feet: Secondary | ICD-10-CM | POA: Diagnosis present

## 2021-04-18 DIAGNOSIS — R296 Repeated falls: Secondary | ICD-10-CM | POA: Diagnosis present

## 2021-04-18 DIAGNOSIS — R2689 Other abnormalities of gait and mobility: Secondary | ICD-10-CM | POA: Insufficient documentation

## 2021-04-18 NOTE — Therapy (Signed)
?OUTPATIENT PHYSICAL THERAPY TREATMENT NOTE ? ? ?Patient Name: Ryan Jenkins ?MRN: [id number] ?DOB:[birth year], 61 y.o., male ?Today's Date: 04/18/2021 ? ?PCP: Armanda Heritage, NP ?REFERRING PROVIDER: Armanda Heritage, NP ? ? PT End of Session - 04/18/21 1522   ? ? Visit Number 7   ? Number of Visits 12   ? Date for PT Re-Evaluation 05/10/21   ? Authorization Type [employer]   ? Authorization Time Period request authorization after 12th visit   ? Authorization - Visit Number 5   ? Authorization - Number of Visits 12   ? PT Start Time 1522   ? PT Stop Time 0086   ? PT Time Calculation (min) 44 min   ? Equipment Utilized During Treatment Gait belt   ? Activity Tolerance Patient tolerated treatment well   ? Behavior During Therapy [facility] for tasks assessed/performed   ? ?  ?  ? ?  ? ? ? ? ? ?Past Medical History:  ?Diagnosis Date  ? Anemia   ? Anxiety   ? BPH (benign prostatic hyperplasia)   ? CAD (coronary artery disease)   ? Chronic diastolic CHF (congestive heart failure) ([place])   ? Chronic kidney disease, stage IV (severe) ([place])   ? Depression   ? Diabetes mellitus with complication ([place])   ? Diabetic neuropathy ([place])   ? GERD (gastroesophageal reflux disease)   ? Hypertension   ? Retinopathy due to secondary diabetes ([place])   ? Sleep disturbances 11/25/2018  ? Vision changes 11/25/2018  ? Vitamin B12 deficiency   ? Vitreous hemorrhage ([place])   ? ?Past Surgical History:  ?Procedure Laterality Date  ? CATARACT EXTRACTION Bilateral 2017  ? ?Patient Active Problem List  ? Diagnosis Date Noted  ? Coronary artery disease 02/07/2021  ? Acute renal failure superimposed on stage 4 chronic kidney disease ([place])   ? Acute respiratory failure with hypoxia ([place]) 02/01/2021  ? Acute on chronic congestive heart failure ([place]) 01/31/2021  ? Acute CHF (congestive heart failure) ([place]) 08/03/2020  ? CKD (chronic kidney disease) stage 4, GFR 15-29 ml/min (HCC) 07/07/2020  ? Chest pain 07/07/2020  ? Acute on chronic diastolic CHF  (congestive heart failure) ([place]) 07/07/2020  ? Erectile dysfunction associated with type 2 diabetes mellitus ([place]) 08/07/2019  ? Hives 08/07/2019  ? Vitreous hemorrhage of left eye ([place]) 07/22/2019  ? Elevated troponin 07/22/2019  ? Vision loss of left eye 07/22/2019  ? History of medication noncompliance 04/02/2019  ? Gastric ulcer without hemorrhage or perforation 12/25/2018  ? CKD (chronic kidney disease), stage III ([place]) 12/25/2018  ? Gastroesophageal reflux disease without esophagitis 09/04/2018  ? Diabetic retinopathy of both eyes associated with type 2 diabetes mellitus ([place]) 01/03/2018  ? Intermittent diarrhea 09/27/2017  ? Gastroparesis 09/27/2017  ? Macroalbuminuric diabetic nephropathy ([place]) 06/25/2017  ? History of falling 06/25/2017  ? Hyperlipidemia 03/21/2017  ? Vitamin B 12 deficiency 03/21/2017  ? Type 2 diabetes mellitus with peripheral neuropathy ([place]) 02/05/2017  ? Essential hypertension 02/05/2017  ? Depression 02/05/2017  ? Unintended weight loss 02/05/2017  ? Gait disturbance 02/05/2017  ? Pronation deformity of both feet 05/11/2014  ? Diabetic neuropathy, type II diabetes mellitus ([place]) 05/11/2014  ? Metatarsal deformity 05/11/2014  ? ? ?REFERRING DIAG: Unsteady gait [R26.81] ? ?THERAPY DIAG:  ?No diagnosis found. ? ?PERTINENT HISTORY: CHF, diabetes, peripheral neuropathy in feet and hands (no feeling), Fall risk, stage 4 CKD ? ?PRECAUTIONS/RESTRICTIONS:  ? ?Fall, CHF (no recumbent positions), peripheral neuropathy, MONITOR VITALS ? ?SUBJECTIVE: Pt reports  that he was tired after last session.  ? ? ?Pain:  ?Are you having pain? No  ?Right foot ?0/10 ?Aching ?Bilateral feet swollen ? ?OBJECTIVE: ? ?LE MMT: ?  ?MMT Right ?03/15/2021 Left ?03/15/2021  ?Hip flexion (L2, L3) 2+ 2+  ?Knee extension (L3) 3 3  ?Knee flexion 3 3+  ?Hip abduction 3 3  ?Hip extension D (STS) D (STS)  ?Hip external rotation      ?Hip internal rotation      ?Hip adduction      ?Ankle dorsiflexion (L4) Unable to bil heel raise  Unable to bil heel raise  ?Ankle plantarflexion (S1)      ?Ankle inversion      ?Ankle eversion      ?Great Toe ext (L5)      ?Grossly      ?  ?(Blank rows = not tested, score listed is out of 5 possible points.  N = WNL, D = diminished, C = clear for gross weakness with myotome testing, * = concordant pain with testing) ?  ?LE ROM: ?  ?ROM Right ?03/15/2021 Left ?03/15/2021  ?Hip flexion      ?Hip extension      ?Hip abduction      ?Hip adduction      ?Hip internal rotation      ?Hip external rotation      ?Knee flexion      ?Knee extension      ?Ankle dorsiflexion      ?Ankle plantarflexion      ?Ankle inversion      ?Ankle eversion      ?  ?(Blank rows = not tested, N = WNL, * = concordant pain with testing) ? ?ASTERISK SIGNS ?  ?  ?Asterisk Signs  Eval  3/21          ?LE gross MMT              ?2 MWT 160'  198'          ?60'' STS 2x            ? balance              ?               ?  ?  ?HOME EXERCISE PROGRAM: ?Access Code: [email or web address] ?URL: [email or web address] ?Date: 03/15/2021 ?Prepared by: Shearon Balo ?  ?Exercises ?Seated Hip Abduction with Resistance - 1 x daily - 7 x weekly - 3 sets - 10 reps ?Seated March with Resistance - 1 x daily - 7 x weekly - 3 sets - 10 reps ?Seated Long Arc Quad - 1 x daily - 7 x weekly - 3 sets - 10 reps ?Seated Hip Adduction Isometrics with Ball - 1 x daily - 7 x weekly - 1 sets - 10 reps - 10 hold ?Seated Hamstring Curl with Anchored Resistance - 1 x daily - 7 x weekly - 3 sets - 10 reps ? ? ?TREATMENT  ?[facility] Adult PT Treatment:                                                DATE: 04/18/2021 ?Therapeutic Exercise: ?Recumbent bike - 5 min L2 ?Standing heel raises 3x10 partial ROM ?Walking w/ SPC for endurance - 4 laps in // bars (more stable turning to R than L) ?Step up  2'' x 5 ea with SPC in // (CGA to min assist) ?STS, no UE, 2x5 from chair with 1 airex (no assist needed)  ?Knee ext machine - bil 2x10 @ 5# ?Standing hip abduction 2x10 (1 instance of L knee  buckling) ?Standing march 2x10 BIL in // bars ? ? ?[facility] Adult PT Treatment: DATE: 04/06/2021 ?Therapeutic Exercise: ?Recumbent bike - 5 min L2 ?Standing heel raises 3x10 partial ROM ?Walking w/ SPC for endurance - 4 laps in // bars ?Step up 2'' - 5x ea with SPC in // (CGA to mod assist) ?Standing march 2x20 ?STS, no UE, 2x5 from 64 cm table (no assist needed) (NT) ?Knee ext machine - bil 2x10 @ 5# ?Standing hip abduction 2x20 ? ?[facility] Adult PT Treatment: DATE: 04/04/2021 ?Therapeutic Exercise: ?Nustep level 5 x 5 min while gathering subjective ?Standing heel raises 3x10 partial ROM ?Walking 2' for endurance ?Standing march 3x20 ?STS, no UE, 2x5 from 64 cm table (no assist needed) ?LAQ 7.5# 2x10 ?Standing hip adduction 1x20, 1x10 ? ?  ?  ?ASSESSMENT: ?  ?CLINICAL IMPRESSION: ?Patient presents to PT with no current pain and reports HEP compliance. Session today focused on improving standing activity tolerance and bilateral lower extremity strengthening. He had one instance of L knee buckling during standing hip abduction in parallel bars with patient able to recover. He was able to complete step ups with up to minA this session. Patient continues to benefit from skilled PT services and should be progressed as able to improve functional independence. ?. ? ?  ?OBJECTIVE IMPAIRMENTS: gait, balance, endurance, global strength ?  ?ACTIVITY LIMITATIONS: walking, standing, transfers, bending, lifting, shopping ?  ?PERSONAL FACTORS: See medical history and pertinent history ?  ?  ?REHAB POTENTIAL: Fair chronic condition with complex medical history   ?  ?CLINICAL DECISION MAKING: Stable/uncomplicated ?  ?EVALUATION COMPLEXITY: Low ?  ?  ?GOALS: ?  ?SHORT TERM GOALS: ?  ?STG Name Target Date Goal status  ?[place] will be >75% HEP compliant to improve carryover between sessions and facilitate independent management of condition ?  ?Baseline: No HEP 04/05/2021 MET 3/21  ?  ?LONG TERM GOALS:  ?  ?LTG Name Target Date Goal status   ?[place] will improve the following MMTs to >/= 4/5 to show improvement in strength:  LE grossly   ?  ?Baseline: see chart 05/10/2021 INITIAL  ?2 [place] will improve 30'' STS (MCID 2) to >/= 5x (w/ UE?: Y) to show i

## 2021-04-20 ENCOUNTER — Encounter: Payer: Self-pay | Admitting: Physical Therapy

## 2021-04-20 ENCOUNTER — Ambulatory Visit: Payer: Medicaid Other | Admitting: Physical Therapy

## 2021-04-20 DIAGNOSIS — R2681 Unsteadiness on feet: Secondary | ICD-10-CM

## 2021-04-20 DIAGNOSIS — R2689 Other abnormalities of gait and mobility: Secondary | ICD-10-CM

## 2021-04-20 DIAGNOSIS — R296 Repeated falls: Secondary | ICD-10-CM

## 2021-04-20 DIAGNOSIS — M6281 Muscle weakness (generalized): Secondary | ICD-10-CM

## 2021-04-20 NOTE — Therapy (Addendum)
? ?PHYSICAL THERAPY UNPLANNED DISCHARGE SUMMARY  ? ?Visits from Start of Care: 8 ? ?Current functional level related to goals / functional outcomes: ?Current status unknown ?  ?Remaining deficits: ?Current status unknown ?  ?Education / Equipment: ?Pt has not returned since visit listed below ? ?Patient goals were not assessed. Patient is being discharged due to not returning since the last visit. ? ?(the below note was addended to include the above D/C summary on 05/31/21) ? ?OUTPATIENT PHYSICAL THERAPY TREATMENT NOTE ? ? ?Patient Name: Ryan Jenkins ?MRN: [id number] ?DOB:1960/03/14, 61 y.o., male ?Today's Date: 04/20/2021 ? ?PCP: Armanda Heritage, NP ?REFERRING PROVIDER: Edwin Dada,* ? ? PT End of Session - 04/20/21 1526   ? ? Visit Number 8   ? Number of Visits 12   ? Date for PT Re-Evaluation 05/10/21   ? Authorization Type [employer]   ? Authorization Time Period request authorization after 12th visit   ? Authorization - Visit Number 8   ? Authorization - Number of Visits 12   ? PT Start Time 0330   ? PT Stop Time [id number]   ? PT Time Calculation (min) 42 min   ? Equipment Utilized During Treatment Gait belt   ? Activity Tolerance Patient tolerated treatment well   ? Behavior During Therapy [facility] for tasks assessed/performed   ? ?  ?  ? ?  ? ? ? ? ? ?Past Medical History:  ?Diagnosis Date  ? Anemia   ? Anxiety   ? BPH (benign prostatic hyperplasia)   ? CAD (coronary artery disease)   ? Chronic diastolic CHF (congestive heart failure) ([place])   ? Chronic kidney disease, stage IV (severe) ([place])   ? Depression   ? Diabetes mellitus with complication ([place])   ? Diabetic neuropathy ([place])   ? GERD (gastroesophageal reflux disease)   ? Hypertension   ? Retinopathy due to secondary diabetes ([place])   ? Sleep disturbances 11/25/2018  ? Vision changes 11/25/2018  ? Vitamin B12 deficiency   ? Vitreous hemorrhage ([place])   ? ?Past Surgical History:  ?Procedure Laterality Date  ? CATARACT EXTRACTION Bilateral 2017   ? ?Patient Active Problem List  ? Diagnosis Date Noted  ? Coronary artery disease 02/07/2021  ? Acute renal failure superimposed on stage 4 chronic kidney disease ([place])   ? Acute respiratory failure with hypoxia ([place]) 02/01/2021  ? Acute on chronic congestive heart failure ([place]) 01/31/2021  ? Acute CHF (congestive heart failure) ([place]) 08/03/2020  ? CKD (chronic kidney disease) stage 4, GFR 15-29 ml/min (HCC) 07/07/2020  ? Chest pain 07/07/2020  ? Acute on chronic diastolic CHF (congestive heart failure) ([employer]) 07/07/2020  ? Erectile dysfunction associated with type 2 diabetes mellitus ([place]) 08/07/2019  ? Hives 08/07/2019  ? Vitreous hemorrhage of left eye ([place]) 07/22/2019  ? Elevated troponin 07/22/2019  ? Vision loss of left eye 07/22/2019  ? History of medication noncompliance 04/02/2019  ? Gastric ulcer without hemorrhage or perforation 12/25/2018  ? CKD (chronic kidney disease), stage III ([place]) 12/25/2018  ? Gastroesophageal reflux disease without esophagitis 09/04/2018  ? Diabetic retinopathy of both eyes associated with type 2 diabetes mellitus ([place]) 01/03/2018  ? Intermittent diarrhea 09/27/2017  ? Gastroparesis 09/27/2017  ? Macroalbuminuric diabetic nephropathy ([place]) 06/25/2017  ? History of falling 06/25/2017  ? Hyperlipidemia 03/21/2017  ? Vitamin B 12 deficiency 03/21/2017  ? Type 2 diabetes mellitus with peripheral neuropathy ([place]) 02/05/2017  ? Essential hypertension 02/05/2017  ? Depression 02/05/2017  ? Unintended weight  loss 02/05/2017  ? Gait disturbance 02/05/2017  ? Pronation deformity of both feet 05/11/2014  ? Diabetic neuropathy, type II diabetes mellitus ([place]) 05/11/2014  ? Metatarsal deformity 05/11/2014  ? ? ?REFERRING DIAG: Unsteady gait [R26.81] ? ?THERAPY DIAG:  ?Unsteadiness on feet ? ?Other abnormalities of gait and mobility ? ?Muscle weakness ? ?Repeated falls ? ?PERTINENT HISTORY: CHF, diabetes, peripheral neuropathy in feet and hands (no feeling), Fall risk, stage 4  CKD ? ?PRECAUTIONS/RESTRICTIONS:  ? ?Fall, CHF (no recumbent positions), peripheral neuropathy, MONITOR VITALS ? ?SUBJECTIVE:  ? ?Pt reports that he feels his endurance is improving slowly. ? ?Pain:  ?Are you having pain? No  ?Right foot ?0/10 ?Aching ?Bilateral feet swollen ? ?OBJECTIVE: ? ?LE MMT: ?  ?MMT Right ?03/15/2021 Left ?03/15/2021  ?Hip flexion (L2, L3) 2+ 2+  ?Knee extension (L3) 3 3  ?Knee flexion 3 3+  ?Hip abduction 3 3  ?Hip extension D (STS) D (STS)  ?Hip external rotation      ?Hip internal rotation      ?Hip adduction      ?Ankle dorsiflexion (L4) Unable to bil heel raise Unable to bil heel raise  ?Ankle plantarflexion (S1)      ?Ankle inversion      ?Ankle eversion      ?Great Toe ext (L5)      ?Grossly      ?  ?(Blank rows = not tested, score listed is out of 5 possible points.  N = WNL, D = diminished, C = clear for gross weakness with myotome testing, * = concordant pain with testing) ?  ?LE ROM: ?  ?ROM Right ?03/15/2021 Left ?03/15/2021  ?Hip flexion      ?Hip extension      ?Hip abduction      ?Hip adduction      ?Hip internal rotation      ?Hip external rotation      ?Knee flexion      ?Knee extension      ?Ankle dorsiflexion      ?Ankle plantarflexion      ?Ankle inversion      ?Ankle eversion      ?  ?(Blank rows = not tested, N = WNL, * = concordant pain with testing) ? ?ASTERISK SIGNS ?  ?  ?Asterisk Signs  Eval  3/21  4/6        ?LE gross MMT              ?2 MWT 160'  198'  245'        ?62'' STS 2x            ? balance              ?               ?  ?  ?HOME EXERCISE PROGRAM: ?Access Code: [email or web address] ?URL: [email or web address] ?Date: 03/15/2021 ?Prepared by: Shearon Balo ?  ?Exercises ?Seated Hip Abduction with Resistance - 1 x daily - 7 x weekly - 3 sets - 10 reps ?Seated March with Resistance - 1 x daily - 7 x weekly - 3 sets - 10 reps ?Seated Long Arc Quad - 1 x daily - 7 x weekly - 3 sets - 10 reps ?Seated Hip Adduction Isometrics with Ball - 1 x daily - 7 x weekly - 1  sets - 10 reps - 10 hold ?Seated Hamstring Curl with Anchored Resistance - 1 x daily - 7 x weekly - 3  sets - 10 reps ? ? ?TREATMENT  ? ?[facility] Adult PT Treatment:                                                DATE: 04/20/2021 ?Therapeutic Exercise: ?Recumbent bike - 5 min L4 ?Walking 3 laps // with 2 hurdles ?Step up 4'' 2x10 ea with// (CGA to min assist) ?STS, no UE, 2x5 from chair with 1 airex (no assist needed)  ?Knee ext machine - bil 2x10 @ 10# ?2 MWT - 245' ? ?[facility] Adult PT Treatment:                                                DATE: 04/18/2021 ?Therapeutic Exercise: ?Recumbent bike - 5 min L2 ?Standing heel raises 3x10 partial ROM ?Walking w/ SPC for endurance - 4 laps in // bars (more stable turning to R than L) ?Step up 2'' x 5 ea with SPC in // (CGA to min assist) ?STS, no UE, 2x5 from chair with 1 airex (no assist needed)  ?Knee ext machine - bil 2x10 @ 5# ?Standing hip abduction 2x10 (1 instance of L knee buckling) ?Standing march 2x10 BIL in // bars ? ? ?[facility] Adult PT Treatment: DATE: 04/06/2021 ?Therapeutic Exercise: ?Recumbent bike - 5 min L2 ?Standing heel raises 3x10 partial ROM ?Walking w/ SPC for endurance - 4 laps in // bars ?Step up 2'' - 5x ea with SPC in // (CGA to mod assist) ?Standing march 2x20 ?STS, no UE, 2x5 from 64 cm table (no assist needed) (NT) ?Knee ext machine - bil 2x10 @ 5# ?Standing hip abduction 2x20 ? ?[facility] Adult PT Treatment: DATE: 04/04/2021 ?Therapeutic Exercise: ?Nustep level 5 x 5 min while gathering subjective ?Standing heel raises 3x10 partial ROM ?Walking 2' for endurance ?Standing march 3x20 ?STS, no UE, 2x5 from 64 cm table (no assist needed) ?LAQ 7.5# 2x10 ?Standing hip adduction 1x20, 1x10 ? ?  ?  ?ASSESSMENT: ?  ?CLINICAL IMPRESSION: ?Pt continues to make slow progress toward goals he has improved his 2 MWT by > 40' indicating significant improvement in endurance.  Continues to demonstrate instability in gait and will continue to benefit from strengthening and  endurance training in supervised setting. ? ?OBJECTIVE IMPAIRMENTS: gait, balance, endurance, global strength ?  ?ACTIVITY LIMITATIONS: walking, standing, transfers, bending, lifting, shopping ?  ?PERSONAL FACTORS: See medical his

## 2021-04-26 ENCOUNTER — Ambulatory Visit: Payer: Medicaid Other

## 2021-04-26 NOTE — Therapy (Incomplete)
?OUTPATIENT PHYSICAL THERAPY TREATMENT NOTE ? ? ?Patient Name: Ryan Jenkins ?MRN: [id number] ?DOB:[birth year], 61 y.o., male ?Today's Date: 04/26/2021 ? ?PCP: Armanda Heritage, NP ?REFERRING PROVIDER: Edwin Dada,* ? ? ? ? ? ? ? ?Past Medical History:  ?Diagnosis Date  ? Anemia   ? Anxiety   ? BPH (benign prostatic hyperplasia)   ? CAD (coronary artery disease)   ? Chronic diastolic CHF (congestive heart failure) ([place])   ? Chronic kidney disease, stage IV (severe) ([place])   ? Depression   ? Diabetes mellitus with complication ([place])   ? Diabetic neuropathy ([place])   ? GERD (gastroesophageal reflux disease)   ? Hypertension   ? Retinopathy due to secondary diabetes ([place])   ? Sleep disturbances 11/25/2018  ? Vision changes 11/25/2018  ? Vitamin B12 deficiency   ? Vitreous hemorrhage ([place])   ? ?Past Surgical History:  ?Procedure Laterality Date  ? CATARACT EXTRACTION Bilateral 2017  ? ?Patient Active Problem List  ? Diagnosis Date Noted  ? Coronary artery disease 02/07/2021  ? Acute renal failure superimposed on stage 4 chronic kidney disease ([place])   ? Acute respiratory failure with hypoxia ([place]) 02/01/2021  ? Acute on chronic congestive heart failure ([place]) 01/31/2021  ? Acute CHF (congestive heart failure) ([place]) 08/03/2020  ? CKD (chronic kidney disease) stage 4, GFR 15-29 ml/min (HCC) 07/07/2020  ? Chest pain 07/07/2020  ? Acute on chronic diastolic CHF (congestive heart failure) ([place]) 07/07/2020  ? Erectile dysfunction associated with type 2 diabetes mellitus ([place]) 08/07/2019  ? Hives 08/07/2019  ? Vitreous hemorrhage of left eye ([place]) 07/22/2019  ? Elevated troponin 07/22/2019  ? Vision loss of left eye 07/22/2019  ? History of medication noncompliance 04/02/2019  ? Gastric ulcer without hemorrhage or perforation 12/25/2018  ? CKD (chronic kidney disease), stage III ([place]) 12/25/2018  ? Gastroesophageal reflux disease without esophagitis 09/04/2018  ? Diabetic retinopathy of both eyes associated with type 2  diabetes mellitus ([place]) 01/03/2018  ? Intermittent diarrhea 09/27/2017  ? Gastroparesis 09/27/2017  ? Macroalbuminuric diabetic nephropathy ([place]) 06/25/2017  ? History of falling 06/25/2017  ? Hyperlipidemia 03/21/2017  ? Vitamin B 12 deficiency 03/21/2017  ? Type 2 diabetes mellitus with peripheral neuropathy ([place]) 02/05/2017  ? Essential hypertension 02/05/2017  ? Depression 02/05/2017  ? Unintended weight loss 02/05/2017  ? Gait disturbance 02/05/2017  ? Pronation deformity of both feet 05/11/2014  ? Diabetic neuropathy, type II diabetes mellitus ([place]) 05/11/2014  ? Metatarsal deformity 05/11/2014  ? ? ?REFERRING DIAG: Unsteady gait [R26.81] ? ?THERAPY DIAG:  ?No diagnosis found. ? ?PERTINENT HISTORY: CHF, diabetes, peripheral neuropathy in feet and hands (no feeling), Fall risk, stage 4 CKD ? ?PRECAUTIONS/RESTRICTIONS:  ? ?Fall, CHF (no recumbent positions), peripheral neuropathy, MONITOR VITALS ? ?SUBJECTIVE: *** ? ?Pt reports that he feels his endurance is improving slowly. ? ?Pain:  ?Are you having pain? No  ?Right foot ?***/10 ?Aching ?Bilateral feet swollen ? ?OBJECTIVE: ? ?LE MMT: ?  ?MMT Right ?03/15/2021 Left ?03/15/2021  ?Hip flexion (L2, L3) 2+ 2+  ?Knee extension (L3) 3 3  ?Knee flexion 3 3+  ?Hip abduction 3 3  ?Hip extension D (STS) D (STS)  ?Hip external rotation      ?Hip internal rotation      ?Hip adduction      ?Ankle dorsiflexion (L4) Unable to bil heel raise Unable to bil heel raise  ?Ankle plantarflexion (S1)      ?Ankle inversion      ?Ankle eversion      ?  Great Toe ext (L5)      ?Grossly      ?  ?(Blank rows = not tested, score listed is out of 5 possible points.  N = WNL, D = diminished, C = clear for gross weakness with myotome testing, * = concordant pain with testing) ?  ?LE ROM: ?  ?ROM Right ?03/15/2021 Left ?03/15/2021  ?Hip flexion      ?Hip extension      ?Hip abduction      ?Hip adduction      ?Hip internal rotation      ?Hip external rotation      ?Knee flexion      ?Knee extension       ?Ankle dorsiflexion      ?Ankle plantarflexion      ?Ankle inversion      ?Ankle eversion      ?  ?(Blank rows = not tested, N = WNL, * = concordant pain with testing) ? ?ASTERISK SIGNS ?  ?  ?Asterisk Signs  Eval  3/21  4/6  4/12      ?LE gross MMT              ?2 MWT 160'  198'  245'        ?92'' STS 2x      ***      ? balance              ?               ?  ?  ?HOME EXERCISE PROGRAM: ?Access Code: [email or web address] ?URL: [email or web address] ?Date: 03/15/2021 ?Prepared by: Shearon Balo ?  ?Exercises ?Seated Hip Abduction with Resistance - 1 x daily - 7 x weekly - 3 sets - 10 reps ?Seated March with Resistance - 1 x daily - 7 x weekly - 3 sets - 10 reps ?Seated Long Arc Quad - 1 x daily - 7 x weekly - 3 sets - 10 reps ?Seated Hip Adduction Isometrics with Ball - 1 x daily - 7 x weekly - 1 sets - 10 reps - 10 hold ?Seated Hamstring Curl with Anchored Resistance - 1 x daily - 7 x weekly - 3 sets - 10 reps ? ? ?TREATMENT  ?[facility] Adult PT Treatment:                                                DATE: 04/26/2021 ?Therapeutic Exercise: ?Recumbent bike - 5 min L4 ?Knee ext machine - bil 2x10 @ 10# ?Walking 3 laps // with 2 hurdles ?Standing heel raises 3x10 partial ROM ?Standing march 3x20 ?Standing hip abduction 2x20 ?Step up 4'' 2x10 ea with // (CGA to min assist) ?30" STS *** reps from chair with 1 airex, no UE support  ? ?[facility] Adult PT Treatment:                                                DATE: 04/20/2021 ?Therapeutic Exercise: ?Recumbent bike - 5 min L4 ?Walking 3 laps // with 2 hurdles ?Step up 4'' 2x10 ea with// (CGA to min assist) ?STS, no UE, 2x5 from chair with 1 airex (no assist needed)  ?Knee ext machine - bil 2x10 @ 10# ?2  MWT - 245' ? ?[facility] Adult PT Treatment:                                                DATE: 04/18/2021 ?Therapeutic Exercise: ?Recumbent bike - 5 min L2 ?Standing heel raises 3x10 partial ROM ?Walking w/ SPC for endurance - 4 laps in // bars (more stable turning to R than  L) ?Step up 2'' x 5 ea with SPC in // (CGA to min assist) ?STS, no UE, 2x5 from chair with 1 airex (no assist needed)  ?Knee ext machine - bil 2x10 @ 5# ?Standing hip abduction 2x10 (1 instance of L knee buckling) ?Standing march 2x10 BIL in // bars ? ? ?[facility] Adult PT Treatment: DATE: 04/06/2021 ?Therapeutic Exercise: ?Recumbent bike - 5 min L2 ?Standing heel raises 3x10 partial ROM ?Walking w/ SPC for endurance - 4 laps in // bars ?Step up 2'' - 5x ea with SPC in // (CGA to mod assist) ?Standing march 2x20 ?STS, no UE, 2x5 from 64 cm table (no assist needed) (NT) ?Knee ext machine - bil 2x10 @ 5# ?Standing hip abduction 2x20 ? ?  ?  ?ASSESSMENT: ?  ?CLINICAL IMPRESSION: ?*** ? ?Pt continues to make slow progress toward goals he has improved his 2 MWT by > 40' indicating significant improvement in endurance.  Continues to demonstrate instability in gait and will continue to benefit from strengthening and endurance training in supervised setting. ? ?OBJECTIVE IMPAIRMENTS: gait, balance, endurance, global strength ?  ?ACTIVITY LIMITATIONS: walking, standing, transfers, bending, lifting, shopping ?  ?PERSONAL FACTORS: See medical history and pertinent history ?  ?  ?REHAB POTENTIAL: Fair chronic condition with complex medical history   ?  ?CLINICAL DECISION MAKING: Stable/uncomplicated ?  ?EVALUATION COMPLEXITY: Low ?  ?  ?GOALS: ?  ?SHORT TERM GOALS: ?  ?STG Name Target Date Goal status  ?[place] will be >75% HEP compliant to improve carryover between sessions and facilitate independent management of condition ?  ?Baseline: No HEP 04/05/2021 MET 3/21  ?  ?LONG TERM GOALS:  ?  ?LTG Name Target Date Goal status  ?[place] will improve the following MMTs to >/= 4/5 to show improvement in strength:  LE grossly   ?  ?Baseline: see chart 05/10/2021 INITIAL  ?2 [place] will improve 30'' STS (MCID 2) to >/= 5x (w/ UE?: Y) to show improved LE strength and improved transfers  ?  ?Baseline: 2x  w/ UE? Y 05/10/2021 INITIAL  ?3  [place] will improve 10 meter max gait speed to .70 m/s (.1 m/s MCID) to show functional improvement in ambulation  ?  ?Eval: .5 m/s ?  ?[place]: ?  ? 05/10/2021 INITIAL  ?[place] will improve two minute walk test

## 2021-05-04 ENCOUNTER — Telehealth: Payer: Self-pay

## 2021-05-04 ENCOUNTER — Ambulatory Visit: Payer: Medicaid Other

## 2021-05-04 NOTE — Telephone Encounter (Signed)
Spoke with patient about 2nd missed appointment today. He states he had an oral surgery today and was not able to come to PT. Confirmed next appointment time. ? ?Evelene Croon, PTA ?05/04/21 5:07 PM ? ?

## 2021-05-04 NOTE — Therapy (Incomplete)
?OUTPATIENT PHYSICAL THERAPY TREATMENT NOTE ? ? ?Patient Name: Ryan Jenkins ?MRN: [id number] ?DOB:[birth year], 61 y.o., male ?Today's Date: 05/04/2021 ? ?PCP: Armanda Heritage, NP ?REFERRING PROVIDER: Edwin Dada,* ? ? ? ? ? ? ? ?Past Medical History:  ?Diagnosis Date  ? Anemia   ? Anxiety   ? BPH (benign prostatic hyperplasia)   ? CAD (coronary artery disease)   ? Chronic diastolic CHF (congestive heart failure) ([place])   ? Chronic kidney disease, stage IV (severe) ([place])   ? Depression   ? Diabetes mellitus with complication ([place])   ? Diabetic neuropathy ([place])   ? GERD (gastroesophageal reflux disease)   ? Hypertension   ? Retinopathy due to secondary diabetes ([place])   ? Sleep disturbances 11/25/2018  ? Vision changes 11/25/2018  ? Vitamin B12 deficiency   ? Vitreous hemorrhage ([place])   ? ?Past Surgical History:  ?Procedure Laterality Date  ? CATARACT EXTRACTION Bilateral 2017  ? ?Patient Active Problem List  ? Diagnosis Date Noted  ? Coronary artery disease 02/07/2021  ? Acute renal failure superimposed on stage 4 chronic kidney disease ([place])   ? Acute respiratory failure with hypoxia ([place]) 02/01/2021  ? Acute on chronic congestive heart failure ([place]) 01/31/2021  ? Acute CHF (congestive heart failure) ([place]) 08/03/2020  ? CKD (chronic kidney disease) stage 4, GFR 15-29 ml/min (HCC) 07/07/2020  ? Chest pain 07/07/2020  ? Acute on chronic diastolic CHF (congestive heart failure) ([place]) 07/07/2020  ? Erectile dysfunction associated with type 2 diabetes mellitus ([place]) 08/07/2019  ? Hives 08/07/2019  ? Vitreous hemorrhage of left eye ([place]) 07/22/2019  ? Elevated troponin 07/22/2019  ? Vision loss of left eye 07/22/2019  ? History of medication noncompliance 04/02/2019  ? Gastric ulcer without hemorrhage or perforation 12/25/2018  ? CKD (chronic kidney disease), stage III ([place]) 12/25/2018  ? Gastroesophageal reflux disease without esophagitis 09/04/2018  ? Diabetic retinopathy of both eyes associated with type 2  diabetes mellitus ([place]) 01/03/2018  ? Intermittent diarrhea 09/27/2017  ? Gastroparesis 09/27/2017  ? Macroalbuminuric diabetic nephropathy ([place]) 06/25/2017  ? History of falling 06/25/2017  ? Hyperlipidemia 03/21/2017  ? Vitamin B 12 deficiency 03/21/2017  ? Type 2 diabetes mellitus with peripheral neuropathy ([place]) 02/05/2017  ? Essential hypertension 02/05/2017  ? Depression 02/05/2017  ? Unintended weight loss 02/05/2017  ? Gait disturbance 02/05/2017  ? Pronation deformity of both feet 05/11/2014  ? Diabetic neuropathy, type II diabetes mellitus ([place]) 05/11/2014  ? Metatarsal deformity 05/11/2014  ? ? ?REFERRING DIAG: Unsteady gait [R26.81] ? ?THERAPY DIAG:  ?No diagnosis found. ? ?PERTINENT HISTORY: CHF, diabetes, peripheral neuropathy in feet and hands (no feeling), Fall risk, stage 4 CKD ? ?PRECAUTIONS/RESTRICTIONS:  ? ?Fall, CHF (no recumbent positions), peripheral neuropathy, MONITOR VITALS ? ?SUBJECTIVE: *** ? ?Pt reports that he feels his endurance is improving slowly. ? ?Pain:  ?Are you having pain? No  ?Right foot ?***/10 ?Aching ?Bilateral feet swollen ? ?OBJECTIVE: ? ?LE MMT: ?  ?MMT Right ?03/15/2021 Left ?03/15/2021  ?Hip flexion (L2, L3) 2+ 2+  ?Knee extension (L3) 3 3  ?Knee flexion 3 3+  ?Hip abduction 3 3  ?Hip extension D (STS) D (STS)  ?Hip external rotation      ?Hip internal rotation      ?Hip adduction      ?Ankle dorsiflexion (L4) Unable to bil heel raise Unable to bil heel raise  ?Ankle plantarflexion (S1)      ?Ankle inversion      ?Ankle eversion      ?  Great Toe ext (L5)      ?Grossly      ?  ?(Blank rows = not tested, score listed is out of 5 possible points.  N = WNL, D = diminished, C = clear for gross weakness with myotome testing, * = concordant pain with testing) ?  ?LE ROM: ?  ?ROM Right ?03/15/2021 Left ?03/15/2021  ?Hip flexion      ?Hip extension      ?Hip abduction      ?Hip adduction      ?Hip internal rotation      ?Hip external rotation      ?Knee flexion      ?Knee extension       ?Ankle dorsiflexion      ?Ankle plantarflexion      ?Ankle inversion      ?Ankle eversion      ?  ?(Blank rows = not tested, N = WNL, * = concordant pain with testing) ? ?ASTERISK SIGNS ?  ?  ?Asterisk Signs  Eval  3/21  4/6  4/20      ?LE gross MMT              ?2 MWT 160'  198'  245'        ?61'' STS 2x      ***      ? balance              ?               ?  ?  ?HOME EXERCISE PROGRAM: ?Access Code: [email or web address] ?URL: [email or web address] ?Date: 03/15/2021 ?Prepared by: Shearon Balo ?  ?Exercises ?Seated Hip Abduction with Resistance - 1 x daily - 7 x weekly - 3 sets - 10 reps ?Seated March with Resistance - 1 x daily - 7 x weekly - 3 sets - 10 reps ?Seated Long Arc Quad - 1 x daily - 7 x weekly - 3 sets - 10 reps ?Seated Hip Adduction Isometrics with Ball - 1 x daily - 7 x weekly - 1 sets - 10 reps - 10 hold ?Seated Hamstring Curl with Anchored Resistance - 1 x daily - 7 x weekly - 3 sets - 10 reps ? ? ?TREATMENT  ?[facility] Adult PT Treatment:                                                DATE: 05/04/2021 ?Therapeutic Exercise: ?Recumbent bike - 5 min L4 ?Knee ext machine - bil 2x10 @ 10# ?Walking 3 laps // with 2 hurdles ?Standing heel raises 3x10 partial ROM ?Standing march 3x20 ?Standing hip abduction 2x20 ?Step up 4'' 2x10 ea with // (CGA to min assist) ?30" STS *** reps from chair with 1 airex, no UE support  ? ?[facility] Adult PT Treatment:                                                DATE: 04/20/2021 ?Therapeutic Exercise: ?Recumbent bike - 5 min L4 ?Walking 3 laps // with 2 hurdles ?Step up 4'' 2x10 ea with// (CGA to min assist) ?STS, no UE, 2x5 from chair with 1 airex (no assist needed)  ?Knee ext machine - bil 2x10 @ 10# ?2  MWT - 245' ? ?[facility] Adult PT Treatment:                                                DATE: 04/18/2021 ?Therapeutic Exercise: ?Recumbent bike - 5 min L2 ?Standing heel raises 3x10 partial ROM ?Walking w/ SPC for endurance - 4 laps in // bars (more stable turning to R than  L) ?Step up 2'' x 5 ea with SPC in // (CGA to min assist) ?STS, no UE, 2x5 from chair with 1 airex (no assist needed)  ?Knee ext machine - bil 2x10 @ 5# ?Standing hip abduction 2x10 (1 instance of L knee buckling) ?Standing march 2x10 BIL in // bars ? ?  ?  ?ASSESSMENT: ?  ?CLINICAL IMPRESSION: ?*** ? ?Pt continues to make slow progress toward goals he has improved his 2 MWT by > 40' indicating significant improvement in endurance.  Continues to demonstrate instability in gait and will continue to benefit from strengthening and endurance training in supervised setting. ? ?OBJECTIVE IMPAIRMENTS: gait, balance, endurance, global strength ?  ?ACTIVITY LIMITATIONS: walking, standing, transfers, bending, lifting, shopping ?  ?PERSONAL FACTORS: See medical history and pertinent history ?  ?  ?REHAB POTENTIAL: Fair chronic condition with complex medical history   ?  ?CLINICAL DECISION MAKING: Stable/uncomplicated ?  ?EVALUATION COMPLEXITY: Low ?  ?  ?GOALS: ?  ?SHORT TERM GOALS: ?  ?STG Name Target Date Goal status  ?[place] will be >75% HEP compliant to improve carryover between sessions and facilitate independent management of condition ?  ?Baseline: No HEP 04/05/2021 MET 3/21  ?  ?LONG TERM GOALS:  ?  ?LTG Name Target Date Goal status  ?[place] will improve the following MMTs to >/= 4/5 to show improvement in strength:  LE grossly   ?  ?Baseline: see chart 05/10/2021 INITIAL  ?2 [place] will improve 30'' STS (MCID 2) to >/= 5x (w/ UE?: Y) to show improved LE strength and improved transfers  ?  ?Baseline: 2x  w/ UE? Y 05/10/2021 INITIAL  ?3 [place] will improve 10 meter max gait speed to .70 m/s (.1 m/s MCID) to show functional improvement in ambulation  ?  ?Eval: .5 m/s ?  ?[place]: ?  ? 05/10/2021 INITIAL  ?[place] will improve two minute walk test to 250 feet (MCID 40 ft). ?  ?Baseline: 160' ? ?4/6: 245' 05/10/2021 ongoing  ?  ?PLAN: ?PT FREQUENCY: 1-2x/week ?  ?PT DURATION: 8 weeks (Ending 05/10/2021) ?  ?PLANNED  INTERVENTIONS: Therapeutic exercises, Aquatic therapy, Therapeutic activity, Neuro Muscular re-education, Gait training, Patient/Family education, Joint mobilization, Dry Needling, Electrical stimulation, Spinal mo

## 2021-05-11 ENCOUNTER — Ambulatory Visit: Payer: Medicaid Other | Admitting: Physical Therapy

## 2021-06-04 ENCOUNTER — Emergency Department (HOSPITAL_COMMUNITY): Payer: Medicaid Other

## 2021-06-04 ENCOUNTER — Encounter (HOSPITAL_COMMUNITY): Payer: Self-pay | Admitting: Emergency Medicine

## 2021-06-04 ENCOUNTER — Other Ambulatory Visit: Payer: Self-pay

## 2021-06-04 ENCOUNTER — Inpatient Hospital Stay (HOSPITAL_COMMUNITY)
Admission: EM | Admit: 2021-06-04 | Discharge: 2021-06-13 | DRG: 871 | Disposition: A | Discharge status: Home Health | Payer: Medicaid Other | Attending: Family Medicine | Admitting: Family Medicine

## 2021-06-04 DIAGNOSIS — I2510 Atherosclerotic heart disease of native coronary artery without angina pectoris: Secondary | ICD-10-CM | POA: Diagnosis present

## 2021-06-04 DIAGNOSIS — Z888 Allergy status to other drugs, medicaments and biological substances status: Secondary | ICD-10-CM

## 2021-06-04 DIAGNOSIS — F32A Depression, unspecified: Secondary | ICD-10-CM | POA: Diagnosis present

## 2021-06-04 DIAGNOSIS — A419 Sepsis, unspecified organism: Principal | ICD-10-CM | POA: Diagnosis present

## 2021-06-04 DIAGNOSIS — I2583 Coronary atherosclerosis due to lipid rich plaque: Secondary | ICD-10-CM

## 2021-06-04 DIAGNOSIS — E785 Hyperlipidemia, unspecified: Secondary | ICD-10-CM | POA: Diagnosis present

## 2021-06-04 DIAGNOSIS — R6520 Severe sepsis without septic shock: Secondary | ICD-10-CM | POA: Diagnosis present

## 2021-06-04 DIAGNOSIS — E11319 Type 2 diabetes mellitus with unspecified diabetic retinopathy without macular edema: Secondary | ICD-10-CM | POA: Diagnosis present

## 2021-06-04 DIAGNOSIS — B370 Candidal stomatitis: Secondary | ICD-10-CM | POA: Diagnosis present

## 2021-06-04 DIAGNOSIS — N400 Enlarged prostate without lower urinary tract symptoms: Secondary | ICD-10-CM | POA: Diagnosis present

## 2021-06-04 DIAGNOSIS — R0789 Other chest pain: Secondary | ICD-10-CM | POA: Diagnosis present

## 2021-06-04 DIAGNOSIS — E872 Acidosis, unspecified: Secondary | ICD-10-CM | POA: Diagnosis present

## 2021-06-04 DIAGNOSIS — J189 Pneumonia, unspecified organism: Secondary | ICD-10-CM | POA: Diagnosis not present

## 2021-06-04 DIAGNOSIS — Z8249 Family history of ischemic heart disease and other diseases of the circulatory system: Secondary | ICD-10-CM

## 2021-06-04 DIAGNOSIS — Z794 Long term (current) use of insulin: Secondary | ICD-10-CM

## 2021-06-04 DIAGNOSIS — E1142 Type 2 diabetes mellitus with diabetic polyneuropathy: Secondary | ICD-10-CM | POA: Diagnosis present

## 2021-06-04 DIAGNOSIS — E119 Type 2 diabetes mellitus without complications: Secondary | ICD-10-CM | POA: Diagnosis present

## 2021-06-04 DIAGNOSIS — Z91040 Latex allergy status: Secondary | ICD-10-CM

## 2021-06-04 DIAGNOSIS — Z79899 Other long term (current) drug therapy: Secondary | ICD-10-CM

## 2021-06-04 DIAGNOSIS — Z20822 Contact with and (suspected) exposure to covid-19: Secondary | ICD-10-CM | POA: Diagnosis present

## 2021-06-04 DIAGNOSIS — K219 Gastro-esophageal reflux disease without esophagitis: Secondary | ICD-10-CM | POA: Diagnosis present

## 2021-06-04 DIAGNOSIS — N183 Chronic kidney disease, stage 3 unspecified: Secondary | ICD-10-CM | POA: Diagnosis present

## 2021-06-04 DIAGNOSIS — N179 Acute kidney failure, unspecified: Secondary | ICD-10-CM | POA: Diagnosis not present

## 2021-06-04 DIAGNOSIS — E871 Hypo-osmolality and hyponatremia: Secondary | ICD-10-CM | POA: Diagnosis present

## 2021-06-04 DIAGNOSIS — E1122 Type 2 diabetes mellitus with diabetic chronic kidney disease: Secondary | ICD-10-CM | POA: Diagnosis present

## 2021-06-04 DIAGNOSIS — Z8619 Personal history of other infectious and parasitic diseases: Secondary | ICD-10-CM

## 2021-06-04 DIAGNOSIS — E1143 Type 2 diabetes mellitus with diabetic autonomic (poly)neuropathy: Secondary | ICD-10-CM | POA: Diagnosis present

## 2021-06-04 DIAGNOSIS — I5032 Chronic diastolic (congestive) heart failure: Secondary | ICD-10-CM | POA: Diagnosis present

## 2021-06-04 DIAGNOSIS — R7989 Other specified abnormal findings of blood chemistry: Secondary | ICD-10-CM | POA: Diagnosis present

## 2021-06-04 DIAGNOSIS — Z7982 Long term (current) use of aspirin: Secondary | ICD-10-CM

## 2021-06-04 DIAGNOSIS — Z9842 Cataract extraction status, left eye: Secondary | ICD-10-CM

## 2021-06-04 DIAGNOSIS — J9601 Acute respiratory failure with hypoxia: Secondary | ICD-10-CM | POA: Diagnosis present

## 2021-06-04 DIAGNOSIS — I130 Hypertensive heart and chronic kidney disease with heart failure and stage 1 through stage 4 chronic kidney disease, or unspecified chronic kidney disease: Secondary | ICD-10-CM | POA: Diagnosis present

## 2021-06-04 DIAGNOSIS — F419 Anxiety disorder, unspecified: Secondary | ICD-10-CM | POA: Diagnosis present

## 2021-06-04 DIAGNOSIS — K3184 Gastroparesis: Secondary | ICD-10-CM | POA: Diagnosis present

## 2021-06-04 DIAGNOSIS — I10 Essential (primary) hypertension: Secondary | ICD-10-CM | POA: Diagnosis present

## 2021-06-04 DIAGNOSIS — N184 Chronic kidney disease, stage 4 (severe): Secondary | ICD-10-CM | POA: Diagnosis present

## 2021-06-04 DIAGNOSIS — Z9841 Cataract extraction status, right eye: Secondary | ICD-10-CM

## 2021-06-04 LAB — BASIC METABOLIC PANEL
Anion gap: 11 (ref 5–15)
BUN: 47 mg/dL — ABNORMAL HIGH (ref 6–20)
CO2: 19 mmol/L — ABNORMAL LOW (ref 22–32)
Calcium: 8.5 mg/dL — ABNORMAL LOW (ref 8.9–10.3)
Chloride: 109 mmol/L (ref 98–111)
Creatinine, Ser: 3.43 mg/dL — ABNORMAL HIGH (ref 0.61–1.24)
GFR, Estimated: 20 mL/min — ABNORMAL LOW (ref >60)
Glucose, Bld: 169 mg/dL — ABNORMAL HIGH (ref 70–99)
Potassium: 3.8 mmol/L (ref 3.5–5.1)
Sodium: 139 mmol/L (ref 135–145)

## 2021-06-04 LAB — CBC
HCT: 30.4 % — ABNORMAL LOW (ref 39.0–52.0)
Hemoglobin: 10.2 g/dL — ABNORMAL LOW (ref 13.0–17.0)
MCH: 30 pg (ref 26.0–34.0)
MCHC: 33.6 g/dL (ref 30.0–36.0)
MCV: 89.4 fL (ref 80.0–100.0)
Platelets: 306 10*3/uL (ref 150–400)
RBC: 3.4 MIL/uL — ABNORMAL LOW (ref 4.22–5.81)
RDW: 14 % (ref 11.5–15.5)
WBC: 15 10*3/uL — ABNORMAL HIGH (ref 4.0–10.5)
nRBC: 0 % (ref 0.0–0.2)

## 2021-06-04 LAB — RESP PANEL BY RT-PCR (FLU A&B, COVID) ARPGX2
Influenza A by PCR: NEGATIVE
Influenza B by PCR: NEGATIVE
SARS Coronavirus 2 by RT PCR: NEGATIVE

## 2021-06-04 LAB — BRAIN NATRIURETIC PEPTIDE: B Natriuretic Peptide: 1151.1 pg/mL — ABNORMAL HIGH (ref 0.0–100.0)

## 2021-06-04 LAB — TROPONIN I (HIGH SENSITIVITY)
Troponin I (High Sensitivity): 76 ng/L — ABNORMAL HIGH (ref <18)
Troponin I (High Sensitivity): 94 ng/L — ABNORMAL HIGH (ref <18)

## 2021-06-04 LAB — GLUCOSE, CAPILLARY: Glucose-Capillary: 280 mg/dL — ABNORMAL HIGH (ref 70–99)

## 2021-06-04 MED ORDER — PANTOPRAZOLE SODIUM 40 MG PO TBEC
40.0000 mg | DELAYED_RELEASE_TABLET | Freq: Every day | ORAL | Status: DC
  Administered 2021-06-04 – 2021-06-13 (×10): 40 mg via ORAL
  Filled 2021-06-04 (×10): qty 1

## 2021-06-04 MED ORDER — ACETAMINOPHEN 500 MG PO TABS
1000.0000 mg | ORAL_TABLET | Freq: Once | ORAL | Status: AC
  Administered 2021-06-04: 1000 mg via ORAL
  Filled 2021-06-04: qty 2

## 2021-06-04 MED ORDER — ONDANSETRON HCL 4 MG/2ML IJ SOLN
4.0000 mg | Freq: Four times a day (QID) | INTRAMUSCULAR | Status: AC | PRN
  Administered 2021-06-04 – 2021-06-05 (×2): 4 mg via INTRAVENOUS
  Filled 2021-06-04 (×2): qty 2

## 2021-06-04 MED ORDER — CEFTRIAXONE SODIUM 2 G IJ SOLR
2.0000 g | Freq: Once | INTRAMUSCULAR | Status: AC
Start: 2021-06-04 — End: 2021-06-04
  Administered 2021-06-04: 2 g via INTRAVENOUS
  Filled 2021-06-04: qty 20

## 2021-06-04 MED ORDER — SODIUM CHLORIDE 0.9 % IV SOLN
500.0000 mg | INTRAVENOUS | Status: DC
  Administered 2021-06-05: 500 mg via INTRAVENOUS
  Filled 2021-06-04: qty 5

## 2021-06-04 MED ORDER — METOPROLOL SUCCINATE ER 50 MG PO TB24
50.0000 mg | ORAL_TABLET | Freq: Every day | ORAL | Status: DC
  Administered 2021-06-05 – 2021-06-13 (×9): 50 mg via ORAL
  Filled 2021-06-04 (×9): qty 1

## 2021-06-04 MED ORDER — HYDRALAZINE HCL 25 MG PO TABS
100.0000 mg | ORAL_TABLET | Freq: Three times a day (TID) | ORAL | Status: DC
  Administered 2021-06-04 – 2021-06-13 (×26): 100 mg via ORAL
  Filled 2021-06-04 (×25): qty 4

## 2021-06-04 MED ORDER — ATORVASTATIN CALCIUM 40 MG PO TABS
40.0000 mg | ORAL_TABLET | Freq: Every day | ORAL | Status: DC
  Administered 2021-06-05 – 2021-06-13 (×9): 40 mg via ORAL
  Filled 2021-06-04 (×9): qty 1

## 2021-06-04 MED ORDER — ESCITALOPRAM OXALATE 20 MG PO TABS
20.0000 mg | ORAL_TABLET | Freq: Every day | ORAL | Status: DC
  Administered 2021-06-05 – 2021-06-13 (×9): 20 mg via ORAL
  Filled 2021-06-04 (×9): qty 1

## 2021-06-04 MED ORDER — SODIUM CHLORIDE 0.9% FLUSH
3.0000 mL | Freq: Two times a day (BID) | INTRAVENOUS | Status: DC
  Administered 2021-06-04 – 2021-06-13 (×17): 3 mL via INTRAVENOUS

## 2021-06-04 MED ORDER — ASPIRIN 81 MG PO TBEC
81.0000 mg | DELAYED_RELEASE_TABLET | Freq: Every day | ORAL | Status: DC
  Administered 2021-06-05 – 2021-06-13 (×9): 81 mg via ORAL
  Filled 2021-06-04 (×9): qty 1

## 2021-06-04 MED ORDER — NORTRIPTYLINE HCL 25 MG PO CAPS
25.0000 mg | ORAL_CAPSULE | Freq: Every day | ORAL | Status: DC
  Administered 2021-06-04 – 2021-06-12 (×9): 25 mg via ORAL
  Filled 2021-06-04 (×11): qty 1

## 2021-06-04 MED ORDER — GABAPENTIN 100 MG PO CAPS
200.0000 mg | ORAL_CAPSULE | Freq: Every day | ORAL | Status: DC
  Administered 2021-06-05 – 2021-06-13 (×9): 200 mg via ORAL
  Filled 2021-06-04 (×9): qty 2

## 2021-06-04 MED ORDER — FINASTERIDE 5 MG PO TABS
5.0000 mg | ORAL_TABLET | Freq: Every day | ORAL | Status: DC
  Administered 2021-06-05 – 2021-06-13 (×9): 5 mg via ORAL
  Filled 2021-06-04 (×9): qty 1

## 2021-06-04 MED ORDER — AMLODIPINE BESYLATE 10 MG PO TABS
10.0000 mg | ORAL_TABLET | Freq: Every day | ORAL | Status: DC
  Administered 2021-06-05 – 2021-06-13 (×9): 10 mg via ORAL
  Filled 2021-06-04 (×9): qty 1

## 2021-06-04 MED ORDER — SODIUM CHLORIDE 0.9 % IV SOLN
2.0000 g | INTRAVENOUS | Status: AC
  Administered 2021-06-05 – 2021-06-08 (×4): 2 g via INTRAVENOUS
  Filled 2021-06-04 (×4): qty 20

## 2021-06-04 MED ORDER — ACETAMINOPHEN 650 MG RE SUPP
650.0000 mg | Freq: Four times a day (QID) | RECTAL | Status: DC | PRN
Start: 2021-06-04 — End: 2021-06-13

## 2021-06-04 MED ORDER — SODIUM CHLORIDE 0.9 % IV SOLN
500.0000 mg | Freq: Once | INTRAVENOUS | Status: AC
  Administered 2021-06-04: 500 mg via INTRAVENOUS
  Filled 2021-06-04 (×2): qty 5

## 2021-06-04 MED ORDER — POLYETHYLENE GLYCOL 3350 17 G PO PACK
17.0000 g | PACK | Freq: Every day | ORAL | Status: DC | PRN
  Administered 2021-06-08: 17 g via ORAL
  Filled 2021-06-04: qty 1

## 2021-06-04 MED ORDER — ENOXAPARIN SODIUM 30 MG/0.3ML IJ SOSY
30.0000 mg | PREFILLED_SYRINGE | INTRAMUSCULAR | Status: DC
  Administered 2021-06-04 – 2021-06-12 (×9): 30 mg via SUBCUTANEOUS
  Filled 2021-06-04 (×10): qty 0.3

## 2021-06-04 MED ORDER — ACETAMINOPHEN 325 MG PO TABS
650.0000 mg | ORAL_TABLET | Freq: Four times a day (QID) | ORAL | Status: DC | PRN
  Administered 2021-06-05 – 2021-06-13 (×9): 650 mg via ORAL
  Filled 2021-06-04 (×9): qty 2

## 2021-06-04 MED ORDER — INSULIN ASPART 100 UNIT/ML IJ SOLN
0.0000 [IU] | Freq: Three times a day (TID) | INTRAMUSCULAR | Status: DC
  Administered 2021-06-05: 2 [IU] via SUBCUTANEOUS
  Administered 2021-06-05: 3 [IU] via SUBCUTANEOUS
  Administered 2021-06-06 – 2021-06-09 (×6): 2 [IU] via SUBCUTANEOUS
  Administered 2021-06-10 (×2): 3 [IU] via SUBCUTANEOUS
  Administered 2021-06-11: 2 [IU] via SUBCUTANEOUS
  Administered 2021-06-11 – 2021-06-13 (×3): 3 [IU] via SUBCUTANEOUS

## 2021-06-04 MED ORDER — INSULIN ASPART 100 UNIT/ML IJ SOLN
0.0000 [IU] | Freq: Every day | INTRAMUSCULAR | Status: DC
  Administered 2021-06-04: 3 [IU] via SUBCUTANEOUS

## 2021-06-04 NOTE — ED Provider Notes (Signed)
[facility] EMERGENCY DEPARTMENT Provider Note   CSN: [id number] Arrival date & time: 06/04/21  1558     History  Chief Complaint  Patient presents with   Chest Pain    Ryan Jenkins is a 61 y.o. male with history of CKD, CAD, CHF, DM, HTN presenting to the ED with chest pain, cough, and fatigue.  Patient states that over the last several days he has had a worsening cough that is nonproductive but has caused a few episodes of posttussive emesis.  Last night, he developed sharp left-sided chest pain that has since resolved.  Does endorse some mild shortness of breath as well as orthopnea.  He also has noticed some increase swelling in his lower extremities.  No fevers.  He does note that he has felt extremely fatigued and generally weak and has been unable to ambulate due to this weakness.   Chest Pain Associated symptoms: cough, shortness of breath and weakness (generalized)   Associated symptoms: no abdominal pain, no fever and no nausea       Home Medications Prior to Admission medications   Medication Sig Start Date End Date Taking? Authorizing Provider  acetaminophen (TYLENOL) 500 MG tablet Take 1,000 mg by mouth every 6 (six) hours as needed for mild pain or headache.    [provider]  amLODipine (NORVASC) 10 MG tablet Take 1 tablet (10 mg total) by mouth daily. 02/08/21   Danford, Suann Larry, MD  aspirin EC 81 MG EC tablet Take 1 tablet (81 mg total) by mouth daily. Swallow whole. 02/09/21   Danford, Suann Larry, MD  atorvastatin (LIPITOR) 40 MG tablet Take 1 tablet (40 mg total) by mouth daily. 02/08/21   Danford, Suann Larry, MD  Blood Glucose Monitoring Suppl (TRUE METRIX METER) w/Device KIT Use as directed 07/20/20   Ladell Pier, MD  Blood Pressure Monitor DEVI Use as directed to check home blood pressure 2-3 times a week 07/13/20   Camillia Herter, NP  D-Mannose 350 MG CAPS Take 1,050 capsules by mouth in the morning and at bedtime.     [provider]  escitalopram (LEXAPRO) 20 MG tablet Take 1 tablet (20 mg total) by mouth daily. 07/20/20   Charlott Rakes, MD  finasteride (PROSCAR) 5 MG tablet Take 1 tablet (5 mg total) by mouth daily. 07/20/20   Charlott Rakes, MD  furosemide (LASIX) 40 MG tablet Take 1 tablet (40 mg total) by mouth 2 (two) times daily. 02/08/21   Danford, Suann Larry, MD  gabapentin (NEURONTIN) 100 MG capsule Take 2 capsules (200 mg total) by mouth daily for 21 days. 12/10/20 12/31/20  Redwine, Madison A, PA-C  glucose blood (TRUE METRIX BLOOD GLUCOSE TEST) test strip Use as instructed 07/20/20   Ladell Pier, MD  hydrALAZINE (APRESOLINE) 100 MG tablet Take 1 tablet (100 mg total) by mouth 3 (three) times daily. 02/08/21   Danford, Suann Larry, MD  Insulin Syringe-Needle U-100 (TRUEPLUS INSULIN SYRINGE) 31G X 5/16" 0.3 ML MISC Use to inject Levemir at bedtime. 07/20/20   Charlott Rakes, MD  metoprolol succinate (TOPROL-XL) 50 MG 24 hr tablet Take 1 tablet (50 mg total) by mouth at bedtime. Take with or immediately following a meal. 02/08/21   Danford, Suann Larry, MD  nortriptyline (PAMELOR) 25 MG capsule Take 1 capsule (25 mg total) by mouth at bedtime. 07/20/20   Charlott Rakes, MD  oxyCODONE-acetaminophen (PERCOCET/ROXICET) 5-325 MG tablet Take 1 tablet by mouth every 6 (six) hours as needed  for severe pain. 02/20/21   Hayden Rasmussen, MD  pantoprazole (PROTONIX) 40 MG tablet Take 1 tablet (40 mg total) by mouth daily. 02/08/21   Danford, Suann Larry, MD  potassium chloride (KLOR-CON M) 10 MEQ tablet Take 1 tablet (10 mEq total) by mouth daily. 02/08/21   Danford, Suann Larry, MD  traZODone (DESYREL) 50 MG tablet Take 3 tablets (150 mg total) by mouth at bedtime. Patient taking differently: Take 150 mg by mouth at bedtime as needed for sleep. 07/20/20   Charlott Rakes, MD  TRUEplus Lancets 28G MISC Use as directed 07/20/20   Ladell Pier, MD  valACYclovir (VALTREX) 1000 MG tablet Take 1  tablet (1,000 mg total) by mouth 3 (three) times daily. 02/20/21   Hayden Rasmussen, MD      Allergies    Claritin [loratadine], Hydrochlorothiazide, Latex, Lyrica [pregabalin], and Metformin and related    Review of Systems   Review of Systems  Constitutional:  Negative for fever.  Respiratory:  Positive for cough and shortness of breath.   Cardiovascular:  Positive for chest pain and leg swelling.  Gastrointestinal:  Negative for abdominal pain and nausea.  Neurological:  Positive for weakness (generalized). Negative for light-headedness.   Physical Exam Updated Vital Signs BP (!) 143/77   Pulse 90   Temp 99.5 F (37.5 C) (Oral)   Resp (!) 21   SpO2 96%  Physical Exam Constitutional:      Appearance: He is ill-appearing. He is not toxic-appearing or diaphoretic.  HENT:     Head: Normocephalic and atraumatic.  Cardiovascular:     Rate and Rhythm: Normal rate and regular rhythm.     Pulses:          Radial pulses are 2+ on the right side and 2+ on the left side.     Heart sounds: Normal heart sounds. No murmur heard.   No friction rub. No gallop.  Pulmonary:     Effort: Pulmonary effort is normal. No tachypnea, accessory muscle usage or respiratory distress.     Breath sounds: Examination of the right-upper field reveals rales. Rales present. No decreased breath sounds, wheezing or rhonchi.  Abdominal:     Tenderness: There is no abdominal tenderness. There is no guarding or rebound.  Musculoskeletal:     Cervical back: Neck supple.     Right lower leg: Edema present.     Left lower leg: Edema present.  Skin:    General: Skin is warm and dry.  Neurological:     General: No focal deficit present.     Mental Status: He is alert and oriented to person, place, and time.    ED Results / Procedures / Treatments   Labs (all labs ordered are listed, but only abnormal results are displayed) Labs Reviewed  BASIC METABOLIC PANEL - Abnormal; Notable for the following  components:      Result Value   CO2 19 (*)    Glucose, Bld 169 (*)    BUN 47 (*)    Creatinine, Ser 3.43 (*)    Calcium 8.5 (*)    GFR, Estimated 20 (*)    All other components within normal limits  CBC - Abnormal; Notable for the following components:   WBC 15.0 (*)    RBC 3.40 (*)    Hemoglobin 10.2 (*)    HCT 30.4 (*)    All other components within normal limits  TROPONIN I (HIGH SENSITIVITY) - Abnormal; Notable for the following components:  Troponin I (High Sensitivity) 76 (*)    All other components within normal limits  CULTURE, BLOOD (ROUTINE X 2)  CULTURE, BLOOD (ROUTINE X 2)  RESP PANEL BY RT-PCR (FLU A&B, COVID) ARPGX2  BRAIN NATRIURETIC PEPTIDE  COMPREHENSIVE METABOLIC PANEL  CBC  TROPONIN I (HIGH SENSITIVITY)    EKG EKG Interpretation  Date/Time:  Sunday Jun 04 2021 16:09:46 EDT Ventricular Rate:  91 PR Interval:  174 QRS Duration: 96 QT Interval:  384 QTC Calculation: 472 R Axis:   -21 Text Interpretation: Normal sinus rhythm Septal infarct , age undetermined T wave inversion lead I, V6, seen on prior tracing When compared with ECG of 08-Feb-2021 10:02, PREVIOUS ECG IS PRESENT Confirmed by Octaviano Glow [phone]) on 06/04/2021 6:11:17 PM  Radiology DG Chest 2 View  Result Date: 06/04/2021 CLINICAL DATA:  Chest pain.  Shortness of breath. EXAM: CHEST - 2 VIEW COMPARISON:  February 07, 2021 FINDINGS: Mild patchy opacity in the right mid lower lung. The cardiomediastinal silhouette is stable. No pneumothorax. No nodules or masses. No left-sided infiltrates. IMPRESSION: Mild patchy opacities in the right lung are worrisome for developing pneumonia. Recommend short-term follow-up imaging to ensure resolution. Electronically Signed   By: Dorise Bullion III M.D.   On: 06/04/2021 17:09    Procedures Procedures    Medications Ordered in ED Medications  cefTRIAXone (ROCEPHIN) 2 g in sodium chloride 0.9 % 100 mL IVPB (has no administration in time range)   azithromycin (ZITHROMAX) 500 mg in sodium chloride 0.9 % 250 mL IVPB (has no administration in time range)  aspirin EC tablet 81 mg (has no administration in time range)  amLODipine (NORVASC) tablet 10 mg (has no administration in time range)  atorvastatin (LIPITOR) tablet 40 mg (has no administration in time range)  metoprolol succinate (TOPROL-XL) 24 hr tablet 50 mg (has no administration in time range)  escitalopram (LEXAPRO) tablet 20 mg (has no administration in time range)  pantoprazole (PROTONIX) EC tablet 40 mg (has no administration in time range)  finasteride (PROSCAR) tablet 5 mg (has no administration in time range)  enoxaparin (LOVENOX) injection 30 mg (has no administration in time range)  cefTRIAXone (ROCEPHIN) 2 g in sodium chloride 0.9 % 100 mL IVPB (has no administration in time range)  azithromycin (ZITHROMAX) 500 mg in sodium chloride 0.9 % 250 mL IVPB (has no administration in time range)  sodium chloride flush (NS) 0.9 % injection 3 mL (has no administration in time range)  acetaminophen (TYLENOL) tablet 650 mg (has no administration in time range)    Or  acetaminophen (TYLENOL) suppository 650 mg (has no administration in time range)  polyethylene glycol (MIRALAX / GLYCOLAX) packet 17 g (has no administration in time range)    ED Course/ Medical Decision Making/ A&P Clinical Course as of 06/04/21 1924  Sun Jun 04, 2021  1809 61 yo male presenting with chest pain, SOB, fatigue, found to have RUL PNA, AKI, Trops chronically elevated 76 today.  Will admit for AKI and PNA.  Pt on 2L [place] [MT]    Clinical Course User Index [MT] Trifan, Carola Rhine, MD                           Medical Decision Making Amount and/or Complexity of Data Reviewed Labs: ordered. Radiology: ordered.  Risk Decision regarding hospitalization.   Ryan Jenkins is a 61 y.o. male with history of CKD, CAD, CHF, DM, HTN presenting to the ED with  chest pain, cough, and fatigue.  On exam, the  patient is afebrile and hemodynamically stable.  He does have some mild crackles over the right upper lung fields.  He is generally ill-appearing.  He does have pitting edema of the lower extremities, about 1+.  He is currently on 2 L via nasal cannula due to oxygen saturations of 90-91% on room air.  He does not wear oxygen at baseline.  Differential diagnosis includes CHF exacerbation, pneumonia, ACS, electrolyte abnormality.  Low concern for stroke given no focal deficits on exam.  Chest x-ray is most consistent with a developing right upper lobe pneumonia.  CBC is notable for a white count of 15.  In the setting of his leukocytosis, chest x-ray findings, and cough as well as his findings on lung exam, I do think his presentation is most consistent with pneumonia.  Given IV Rocephin and azithromycin in the ED for CAP coverage.  He does have an elevated troponin to 76.  On chart review, it does appear his troponin is typically elevated in the 50s-60s.  ECG was obtained which shows very mild depressions in V6 and T wave inversions in lead I, but on comparison to prior tracings, it appears largely unchanged.  BMP is notable for a bicarb of 19 and an AKI with a creatinine of 3.43 and BUN of 47.  In the setting of his generalized weakness due to pneumonia and AKI, patient will require admission to the hospital for further management.  Hospitalist team was contacted for admission the patient was admitted to their service in stable condition.  Final Clinical Impression(s) / ED Diagnoses Final diagnoses:  Community acquired pneumonia of right upper lobe of lung    Rx / DC Orders ED Discharge Orders     None         Sondra Come, MD 06/04/21 1924    Wyvonnia Dusky, MD 06/04/21 2342

## 2021-06-04 NOTE — ED Triage Notes (Signed)
Pt to triage via [facility] from home.  Reports chest pain, mild SOB, nausea, and vomiting x 12 hours.    EMS- ASA '325mg'$  NTG x 2- Pain 8/10 to 3/10 IV- 18g LAC CBG 160 O2 90% on 2 liters.  Placed on 2 liters [place] PTA.  Pt states he has a foley cath with leg bag since Friday.

## 2021-06-04 NOTE — Plan of Care (Signed)
The patient is admitted with pneu. A & O x 4. He stated he has H/A of 7/10 on a pain scale and the tylenol 650 mg will not touch it. He's also having N/V and no antiemetic. CBG read 280 and no sliding scale. Notified Dr. Nevada Crane and received orders for the above problems. Will implement as received and continue to monitor.

## 2021-06-04 NOTE — H&P (Signed)
History and Physical   Ryan Jenkins JEH:[id number] DOB: Apr 25, 1960 DOA: 06/04/2021  PCP: Armanda Heritage, NP   Patient coming from: Home  Chief Complaint: Shortness of breath, chest pain, nausea vomiting  HPI: Ryan Jenkins is a 61 y.o. male with medical history significant of diabetes with neuropathy and gastroparesis, hypertension, CKD 4, depression, anxiety, hyperlipidemia, GERD, CHF, CAD, anemia presenting with multiple complaints including shortness of breath and chest pain.  Patient reports that starting earlier today he experienced some shortness of breath with chest pain and some nausea and vomiting.  States that different his typical shortness of breath associated with his CHF.  Currently chest pain-free.  Called EMS and in route received aspirin, nitroglycerin, placed on oxygen.  Does have Foley in place, recently placed for the first time by urology.  Denies fevers, chills, abdominal pain, constipation, diarrhea.  ED Course: Vital signs in the ED significant for blood pressure in the 378H 885O systolic.  Requiring 2 L to maintain saturations.  Lab work-up included BMP with bicarb 19, BUN 47, creatinine elevated to 3.43 from baseline of around 2.8, glucose 169, calcium 8.5.  CBC with leukocytosis to 15, hemoglobin stable at 10.2.  Troponin mildly elevated at 76 with repeat pending.  Blood cultures pending.  Chest x-ray showing mild patchy opacity at the right lower lobe concerning for pneumonia.  Patient received ceftriaxone and azithromycin.  Review of Systems: As per HPI otherwise all other systems reviewed and are negative.  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 He has never used smokeless tobacco. He reports that he does not drink alcohol and does not use drugs.  Allergies  Allergen Reactions   Claritin [Loratadine] Swelling    Joint swelling    Hydrochlorothiazide Other (See Comments)    Dizziness   Latex Hives   Lyrica [Pregabalin] Other (See Comments)    depression   Metformin And Related Other (See Comments)    GI    Family History  Problem Relation Age of Onset   Renal cancer Mother    Hypertension Mother    Pancreatic cancer Mother    Hypertension Sister    Stroke Sister    Leukemia Maternal Uncle    Sickle cell trait Maternal Aunt    Colon cancer Neg Hx    Esophageal cancer Neg Hx    Rectal cancer Neg Hx    Stomach cancer Neg Hx   Reviewed on admission  Prior to Admission medications   Medication Sig Start Date End Date Taking? Authorizing Provider  acetaminophen (TYLENOL) 500 MG tablet Take 1,000 mg by mouth every 6 (six) hours as needed for mild pain or headache.    [provider]  amLODipine (NORVASC) 10 MG tablet Take 1 tablet (  10 mg total) by mouth daily. 02/08/21   Danford, Suann Larry, MD  aspirin EC 81 MG EC tablet Take 1 tablet (81 mg total) by mouth daily. Swallow whole. 02/09/21   Danford, Suann Larry, MD  atorvastatin (LIPITOR) 40 MG tablet Take 1 tablet (40 mg total) by mouth daily. 02/08/21   Danford, Suann Larry, MD  Blood Glucose Monitoring Suppl (TRUE METRIX METER) w/Device KIT Use as directed 07/20/20   Ladell Pier, MD  Blood Pressure Monitor DEVI Use as directed to check home blood pressure 2-3 times a week 07/13/20   Camillia Herter, NP  D-Mannose 350 MG CAPS Take 1,050 capsules by mouth in the morning and at bedtime.    [provider]  escitalopram (LEXAPRO) 20 MG tablet Take 1 tablet (20 mg total) by mouth daily. 07/20/20   Charlott Rakes, MD  finasteride (PROSCAR) 5 MG tablet Take 1 tablet (5 mg total) by mouth daily. 07/20/20   Charlott Rakes, MD  furosemide (LASIX) 40 MG tablet Take 1 tablet (40 mg total) by mouth 2 (two) times daily. 02/08/21   Danford, Suann Larry, MD  gabapentin (NEURONTIN) 100 MG capsule Take 2 capsules (200 mg total) by mouth daily for 21 days. 12/10/20 12/31/20  Redwine, Madison A, PA-C  glucose blood (TRUE METRIX BLOOD GLUCOSE TEST) test strip Use as instructed 07/20/20   Ladell Pier, MD  hydrALAZINE (APRESOLINE) 100 MG tablet Take 1 tablet (100 mg total) by mouth 3 (three) times daily. 02/08/21   Danford, Suann Larry, MD  Insulin Syringe-Needle U-100 (TRUEPLUS INSULIN SYRINGE) 31G X 5/16" 0.3 ML MISC Use to inject Levemir at bedtime. 07/20/20   Charlott Rakes, MD  metoprolol succinate (TOPROL-XL) 50 MG 24 hr tablet Take 1 tablet (50 mg total) by mouth at bedtime. Take with or immediately following a meal. 02/08/21   Danford, Suann Larry, MD  nortriptyline (PAMELOR) 25 MG capsule Take 1 capsule (25 mg total) by mouth at bedtime. 07/20/20   Charlott Rakes, MD  oxyCODONE-acetaminophen (PERCOCET/ROXICET) 5-325 MG tablet Take 1 tablet by mouth every 6 (six) hours as needed for severe pain. 02/20/21   Hayden Rasmussen, MD  pantoprazole (PROTONIX) 40 MG tablet Take 1 tablet (40 mg total) by mouth daily. 02/08/21   Danford, Suann Larry, MD  potassium chloride (KLOR-CON M) 10 MEQ tablet Take 1 tablet (10 mEq total) by mouth daily. 02/08/21   Danford, Suann Larry, MD  traZODone (DESYREL) 50 MG tablet Take 3 tablets (150 mg total) by mouth at bedtime. Patient taking differently: Take 150 mg by mouth at bedtime as needed for sleep. 07/20/20   Charlott Rakes, MD  TRUEplus Lancets 28G MISC Use  as directed 07/20/20   Ladell Pier, MD  valACYclovir (VALTREX) 1000 MG tablet Take 1 tablet (1,000 mg total) by mouth 3 (three) times daily. 02/20/21   Hayden Rasmussen, MD    Physical Exam: Vitals:   06/04/21 1608 06/04/21 1721 06/04/21 1730 06/04/21 2000  BP: 131/65 (!) 152/78 (!) 143/77   Pulse: 92 87 90   Resp: 18 18 (!) 21   Temp: 99.5 F (37.5 C)   99.5 F (37.5 C)  TempSrc: Oral   Oral  SpO2: 91% 97% 96%     Physical Exam Constitutional:      General: He is not in acute distress.    Appearance: Normal appearance.  HENT:     Head: Normocephalic and atraumatic.     Mouth/Throat:     Mouth: Mucous  membranes are moist.     Pharynx: Oropharynx is clear.  Eyes:     Extraocular Movements: Extraocular movements intact.     Pupils: Pupils are equal, round, and reactive to light.  Cardiovascular:     Rate and Rhythm: Normal rate and regular rhythm.     Pulses: Normal pulses.     Heart sounds: Normal heart sounds.  Pulmonary:     Effort: Pulmonary effort is normal. No respiratory distress.     Breath sounds: Rales (Trace to mild) present.  Abdominal:     General: Bowel sounds are normal. There is no distension.     Palpations: Abdomen is soft.     Tenderness: There is no abdominal tenderness.  Musculoskeletal:        General: No swelling or deformity.     Right lower leg: Edema present.     Left lower leg: Edema present.  Skin:    General: Skin is warm and dry.  Neurological:     General: No focal deficit present.     Mental Status: Mental status is at baseline.   Labs on Admission: I have personally reviewed following labs and imaging studies  CBC: Recent Labs  Lab 06/04/21 1620  WBC 15.0*  HGB 10.2*  HCT 30.4*  MCV 89.4  PLT 161    Basic Metabolic Panel: Recent Labs  Lab 06/04/21 1620  NA 139  K 3.8  CL 109  CO2 19*  GLUCOSE 169*  BUN 47*  CREATININE 3.43*  CALCIUM 8.5*    GFR: CrCl cannot be calculated (Unknown ideal weight.).  Liver  Function Tests: No results for input(s): AST, ALT, ALKPHOS, BILITOT, PROT, ALBUMIN in the last 168 hours.  Urine analysis:    Component Value Date/Time   COLORURINE YELLOW 01/31/2021 2139   APPEARANCEUR CLEAR 01/31/2021 2139   LABSPEC 1.011 01/31/2021 2139   PHURINE 7.5 01/31/2021 2139   GLUCOSEU NEGATIVE 01/31/2021 2139   HGBUR NEGATIVE 01/31/2021 2139   [place] NEGATIVE 01/31/2021 2139   [place] NEGATIVE 01/31/2021 2139   PROTEINUR 100 (A) 01/31/2021 2139   NITRITE NEGATIVE 01/31/2021 2139   LEUKOCYTESUR NEGATIVE 01/31/2021 2139    Radiological Exams on Admission: DG Chest 2 View  Result Date: 06/04/2021 CLINICAL DATA:  Chest pain.  Shortness of breath. EXAM: CHEST - 2 VIEW COMPARISON:  February 07, 2021 FINDINGS: Mild patchy opacity in the right mid lower lung. The cardiomediastinal silhouette is stable. No pneumothorax. No nodules or masses. No left-sided infiltrates. IMPRESSION: Mild patchy opacities in the right lung are worrisome for developing pneumonia. Recommend short-term follow-up imaging to ensure resolution. Electronically Signed   By: Dorise Bullion III M.D.   On: 06/04/2021 17:09    EKG: Independently reviewed.  Sinus rhythm at 91 bpm.  T wave inversion lead I and V6 similar to previous.  Some upsloping J-point elevation in multiple leads.  Assessment/Plan Principal Problem:   Pneumonia Active Problems:   Type 2 diabetes mellitus with peripheral neuropathy (HCC)   Essential hypertension   Depression   Hyperlipidemia   Gastroesophageal reflux disease without esophagitis   CKD (chronic kidney disease), stage III (HCC)   Acute respiratory failure with hypoxia (HCC)   Acute renal failure superimposed on stage 4 chronic kidney disease (HCC)   Coronary artery disease   Pneumonia Acute respiratory failure with hypoxia > Patient presenting with onset of shortness of breath and chest pain earlier today with some associated nausea and vomiting. > Not reporting  any fevers and is afebrile  in the ED however does have leukocytosis to 15.  Chest x-ray showing mild patchy opacity of the right lower lobe concerning for pneumonia. > Started on antibiotics in the ED with ceftriaxone and azithromycin.  Requiring 2 L currently. > Does not appear to be volume overloaded based on chest x-ray however does have lower extremity edema.  Getting BNP as below to determine volume status. - Monitor on telemetry - Continue ceftriaxone and azithromycin - Trend fever curve and white count  AKI on CKD 4 > Patient has elevated creatinine to 3.43 from baseline of around 2.8.  > Volume status is a little bit unclear as no volume overload on chest x-ray however does have lower extremity edema.  Will check BNP to clarify before giving significant IV fluids and or diuresing. - Trend renal function electrolytes - Check BMP - Based on BMP results decide on gentle IV fluids versus diuresis.  CHF > Last echo January of this year with EF 14-60%, grade 2 diastolic dysfunction, normal RV function. > As above unclear volume status with lower extremity edema but no evidence of pulmonary edema and evidence of AKI as above.  With his edema and mild rales suspect he is volume up not going down. - Check BNP - We will determine need for diuresis versus gentle fluids based on BMP as above - Daily weights, I's and O's - Trend renal function electrolytes   Hypertension - Continue amlodipine, hydralazine, metoprolol, - Possibly Lasix as above  Hyperlipidemia CAD - Continue home atorvastatin and aspirin  Diabetes - SSI  GERD - Continue home PPI  Neuropathy - Continue home nortriptyline and gabapentin  Depression - Continue home Lexapro  DVT prophylaxis: Lovenox Code Status:   Full Family Communication:  None on admission, states his family is aware of his admission. Disposition Plan:   Patient is from:  Home  Anticipated DC to:  Home  Anticipated DC date:  1 to 3  days  Anticipated DC barriers: None  Consults called:  None  Admission status:  Observation, telemetry  Severity of Illness: The appropriate patient status for this patient is OBSERVATION. Observation status is judged to be reasonable and necessary in order to provide the required intensity of service to ensure the patient's safety. The patient's presenting symptoms, physical exam findings, and initial radiographic and laboratory data in the context of their medical condition is felt to place them at decreased risk for further clinical deterioration. Furthermore, it is anticipated that the patient will be medically stable for discharge from the hospital within 2 midnights of admission.    Marcelyn Bruins MD [facility]  How to contact the [facility] Attending or Consulting provider [place] or covering provider during after hours [place], for this patient?   Check the care team in [facility] and look for a) attending/consulting [facility] provider listed and b) the [facility] team listed Log into [email or web address] and use [place]'s universal password to access. If you do not have the password, please contact the hospital operator. Locate the [facility] provider you are looking for under [facility] and page to a number that you can be directly reached. If you still have difficulty reaching the provider, please page the [facility] (Director on Call) for the Hospitalists listed on amion for assistance.  06/04/2021, 8:10 PM

## 2021-06-05 ENCOUNTER — Inpatient Hospital Stay (HOSPITAL_COMMUNITY): Payer: Medicaid Other

## 2021-06-05 DIAGNOSIS — A419 Sepsis, unspecified organism: Secondary | ICD-10-CM

## 2021-06-05 DIAGNOSIS — I130 Hypertensive heart and chronic kidney disease with heart failure and stage 1 through stage 4 chronic kidney disease, or unspecified chronic kidney disease: Secondary | ICD-10-CM | POA: Diagnosis present

## 2021-06-05 DIAGNOSIS — B370 Candidal stomatitis: Secondary | ICD-10-CM | POA: Diagnosis present

## 2021-06-05 DIAGNOSIS — I5033 Acute on chronic diastolic (congestive) heart failure: Secondary | ICD-10-CM

## 2021-06-05 DIAGNOSIS — E1143 Type 2 diabetes mellitus with diabetic autonomic (poly)neuropathy: Secondary | ICD-10-CM | POA: Diagnosis present

## 2021-06-05 DIAGNOSIS — I5032 Chronic diastolic (congestive) heart failure: Secondary | ICD-10-CM | POA: Diagnosis present

## 2021-06-05 DIAGNOSIS — E1142 Type 2 diabetes mellitus with diabetic polyneuropathy: Secondary | ICD-10-CM | POA: Diagnosis present

## 2021-06-05 DIAGNOSIS — E785 Hyperlipidemia, unspecified: Secondary | ICD-10-CM | POA: Diagnosis present

## 2021-06-05 DIAGNOSIS — R6520 Severe sepsis without septic shock: Secondary | ICD-10-CM | POA: Diagnosis present

## 2021-06-05 DIAGNOSIS — E872 Acidosis, unspecified: Secondary | ICD-10-CM | POA: Diagnosis present

## 2021-06-05 DIAGNOSIS — Z8619 Personal history of other infectious and parasitic diseases: Secondary | ICD-10-CM | POA: Diagnosis not present

## 2021-06-05 DIAGNOSIS — E1122 Type 2 diabetes mellitus with diabetic chronic kidney disease: Secondary | ICD-10-CM | POA: Diagnosis present

## 2021-06-05 DIAGNOSIS — R0789 Other chest pain: Secondary | ICD-10-CM | POA: Diagnosis present

## 2021-06-05 DIAGNOSIS — Z20822 Contact with and (suspected) exposure to covid-19: Secondary | ICD-10-CM | POA: Diagnosis present

## 2021-06-05 DIAGNOSIS — J9601 Acute respiratory failure with hypoxia: Secondary | ICD-10-CM | POA: Diagnosis present

## 2021-06-05 DIAGNOSIS — N1832 Chronic kidney disease, stage 3b: Secondary | ICD-10-CM | POA: Diagnosis not present

## 2021-06-05 DIAGNOSIS — J189 Pneumonia, unspecified organism: Secondary | ICD-10-CM | POA: Diagnosis present

## 2021-06-05 DIAGNOSIS — N179 Acute kidney failure, unspecified: Secondary | ICD-10-CM | POA: Diagnosis present

## 2021-06-05 DIAGNOSIS — R0602 Shortness of breath: Secondary | ICD-10-CM | POA: Diagnosis present

## 2021-06-05 DIAGNOSIS — K219 Gastro-esophageal reflux disease without esophagitis: Secondary | ICD-10-CM | POA: Diagnosis present

## 2021-06-05 DIAGNOSIS — E11319 Type 2 diabetes mellitus with unspecified diabetic retinopathy without macular edema: Secondary | ICD-10-CM | POA: Diagnosis present

## 2021-06-05 DIAGNOSIS — K3184 Gastroparesis: Secondary | ICD-10-CM | POA: Diagnosis present

## 2021-06-05 DIAGNOSIS — E871 Hypo-osmolality and hyponatremia: Secondary | ICD-10-CM | POA: Diagnosis present

## 2021-06-05 DIAGNOSIS — N184 Chronic kidney disease, stage 4 (severe): Secondary | ICD-10-CM | POA: Diagnosis present

## 2021-06-05 DIAGNOSIS — N400 Enlarged prostate without lower urinary tract symptoms: Secondary | ICD-10-CM | POA: Diagnosis present

## 2021-06-05 DIAGNOSIS — I2510 Atherosclerotic heart disease of native coronary artery without angina pectoris: Secondary | ICD-10-CM | POA: Diagnosis present

## 2021-06-05 DIAGNOSIS — F32A Depression, unspecified: Secondary | ICD-10-CM | POA: Diagnosis present

## 2021-06-05 LAB — CBC
HCT: 27.4 % — ABNORMAL LOW (ref 39.0–52.0)
Hemoglobin: 9 g/dL — ABNORMAL LOW (ref 13.0–17.0)
MCH: 29.8 pg (ref 26.0–34.0)
MCHC: 32.8 g/dL (ref 30.0–36.0)
MCV: 90.7 fL (ref 80.0–100.0)
Platelets: 249 10*3/uL (ref 150–400)
RBC: 3.02 MIL/uL — ABNORMAL LOW (ref 4.22–5.81)
RDW: 14.2 % (ref 11.5–15.5)
WBC: 13 10*3/uL — ABNORMAL HIGH (ref 4.0–10.5)
nRBC: 0 % (ref 0.0–0.2)

## 2021-06-05 LAB — COMPREHENSIVE METABOLIC PANEL
ALT: 13 U/L (ref 0–44)
AST: 14 U/L — ABNORMAL LOW (ref 15–41)
Albumin: 2.7 g/dL — ABNORMAL LOW (ref 3.5–5.0)
Alkaline Phosphatase: 61 U/L (ref 38–126)
Anion gap: 7 (ref 5–15)
BUN: 52 mg/dL — ABNORMAL HIGH (ref 6–20)
CO2: 20 mmol/L — ABNORMAL LOW (ref 22–32)
Calcium: 8 mg/dL — ABNORMAL LOW (ref 8.9–10.3)
Chloride: 113 mmol/L — ABNORMAL HIGH (ref 98–111)
Creatinine, Ser: 3.74 mg/dL — ABNORMAL HIGH (ref 0.61–1.24)
GFR, Estimated: 18 mL/min — ABNORMAL LOW (ref >60)
Glucose, Bld: 128 mg/dL — ABNORMAL HIGH (ref 70–99)
Potassium: 3.8 mmol/L (ref 3.5–5.1)
Sodium: 140 mmol/L (ref 135–145)
Total Bilirubin: 0.7 mg/dL (ref 0.3–1.2)
Total Protein: 6.1 g/dL — ABNORMAL LOW (ref 6.5–8.1)

## 2021-06-05 LAB — GLUCOSE, CAPILLARY
Glucose-Capillary: 110 mg/dL — ABNORMAL HIGH (ref 70–99)
Glucose-Capillary: 165 mg/dL — ABNORMAL HIGH (ref 70–99)
Glucose-Capillary: 149 mg/dL — ABNORMAL HIGH (ref 70–99)
Glucose-Capillary: 169 mg/dL — ABNORMAL HIGH (ref 70–99)

## 2021-06-05 LAB — ECHOCARDIOGRAM COMPLETE
AR max vel: 2.48 cm2
AV Peak grad: 6.9 mmHg
Ao pk vel: 1.31 m/s
Area-P 1/2: 6.07 cm2
Calc EF: 57.2 %
Height: 74 in
S' Lateral: 3.4 cm
Single Plane A2C EF: 56.8 %
Single Plane A4C EF: 57.2 %
Weight: 3552.05 oz

## 2021-06-05 LAB — STREP PNEUMONIAE URINARY ANTIGEN: Strep Pneumo Urinary Antigen: NEGATIVE

## 2021-06-05 LAB — PROCALCITONIN: Procalcitonin: 1.43 ng/mL

## 2021-06-05 LAB — HEMOGLOBIN A1C
Hgb A1c MFr Bld: 7.1 % — ABNORMAL HIGH (ref 4.8–5.6)
Mean Plasma Glucose: 157.07 mg/dL

## 2021-06-05 MED ORDER — FUROSEMIDE 10 MG/ML IJ SOLN
40.0000 mg | Freq: Once | INTRAMUSCULAR | Status: AC
Start: 2021-06-05 — End: 2021-06-05
  Administered 2021-06-05: 40 mg via INTRAVENOUS
  Filled 2021-06-05: qty 4

## 2021-06-05 MED ORDER — IPRATROPIUM-ALBUTEROL 0.5-2.5 (3) MG/3ML IN SOLN
3.0000 mL | Freq: Four times a day (QID) | RESPIRATORY_TRACT | Status: DC | PRN
  Administered 2021-06-05: 3 mL via RESPIRATORY_TRACT
  Filled 2021-06-05 (×2): qty 3

## 2021-06-05 MED ORDER — CHLORHEXIDINE GLUCONATE CLOTH 2 % EX PADS
6.0000 | MEDICATED_PAD | Freq: Every day | CUTANEOUS | Status: DC
  Administered 2021-06-05 – 2021-06-13 (×9): 6 via TOPICAL

## 2021-06-05 NOTE — Progress Notes (Signed)
Pt experienced SOB, 02 Sat dropped to 87% at 4L. Increased pt to 6L and called RT. Paged MD. Will continue to monitor the pt.    Lavenia Atlas, RN

## 2021-06-05 NOTE — Progress Notes (Signed)
PROGRESS NOTE    Ryan Jenkins  DQQ:[id number] DOB: August 05, 1960 DOA: 06/04/2021 PCP: Armanda Heritage, NP   Brief Narrative:  Ryan Jenkins is a 61 y.o. male with medical history significant of diabetes with neuropathy and gastroparesis, hypertension, CKD 4, depression, anxiety, hyperlipidemia, GERD, CHF, CAD, anemia presented with multiple complaints including shortness of breath and chest pain and was admitted with acute hypoxic respiratory failure and sepsis secondary to pneumonia.  Assessment & Plan:   Principal Problem:   Pneumonia Active Problems:   Type 2 diabetes mellitus with peripheral neuropathy (HCC)   Essential hypertension   Depression   Hyperlipidemia   Gastroesophageal reflux disease without esophagitis   CKD (chronic kidney disease), stage III (HCC)   Acute respiratory failure with hypoxia (HCC)   Acute renal failure superimposed on stage 4 chronic kidney disease (HCC)   Coronary artery disease   Severe sepsis (HCC)  Severe sepsis and acute hypoxic respiratory failure secondary to community-acquired pneumonia: Patient meets severe sepsis criteria based on temperature of 100.6, tachycardia, tachypnea and leukocytosis of 15 as well as acute hypoxia.  No lactic acid drawn yesterday.  We will draw 1 today.  Patient at one point in time required 8 L of oxygen, currently down to 4 L.  Still feels shortness of breath but better than yesterday.  Leukocytosis improving, last temperature spike just after midnight 06/05/21.  We will check urine antigen for Legionella and streptococci as well as a sputum culture.  Blood culture negative.  Continue Rocephin and Zithromax.  Procalcitonin 1.43.  AKI on CKD stage IV: Patient's baseline creatinine is 2.7 with GFR of 26, presented with 3.43 which is slightly worse at 3.74 today.  This is likely secondary to acute infection.  However, patient also has elevated BNP but chest x-ray does not show pulmonary edema or vascular congestion.   Last echo showed normal ejection fraction.  We will avoid IV fluids until CHF is ruled out.  Repeat labs in the morning.  Chest pain: Patient also complained of chest pain, mostly after coughing.  He also tells me that he had herpes zoster at the right anterior chest going all the way under the right axilla 3 months ago and he continues to have pain there despite of being on gabapentin.  Troponin only slightly elevated on almost flat.  Will repeat echo to rule out wall motion abnormality.  Chronic diastolic congestive heart failure: Last echo done in January 2023 shows grade 2 diastolic dysfunction but normal ejection fraction.  Patient has elevated BNP but chest x-ray negative for pulm edema or vascular congestion.  Per H&P, patient had lower extremity edema but on my examination, there is no edema at all.  We will check echo to see if there is any change in ejection fraction.  Essential hypertension: Blood pressure controlled.  Continue home medications which includes amlodipine, finasteride, hydralazine and metoprolol.  Hyperlipidemia: Continue statin.  Diabetes mellitus type 2: Blood sugar controlled on SSI.  GERD: Continue PPI.  Neuropathy: Continue home nortriptyline and gabapentin.  Chronic mild depression: Continue Lexapro.  DVT prophylaxis: enoxaparin (LOVENOX) injection 30 mg Start: 06/04/21 1915   Code Status: Full Code  Family Communication:  None present at bedside.  Plan of care discussed with patient in length and he/she verbalized understanding and agreed with it.  Status is: Inpatient Remains inpatient appropriate because: Patient is still very sick.   Estimated body mass index is 28.5 kg/m as calculated from the following:   Height as of  this encounter: '6\' 2"'$  (1.88 m).   Weight as of this encounter: 100.7 kg.    Nutritional Assessment: Body mass index is 28.5 kg/m.[name] Seen by dietician.  I agree with the assessment and plan as outlined below: Nutrition Status:         . Skin Assessment: I have examined the patient's skin and I agree with the wound assessment as performed by the wound care RN as outlined below:    Consultants:  None  Procedures:  None  Antimicrobials:  Anti-infectives (From admission, onward)    Start     Dose/Rate Route Frequency Ordered Stop   06/05/21 1800  cefTRIAXone (ROCEPHIN) 2 g in sodium chloride 0.9 % 100 mL IVPB        2 g 200 mL/hr over 30 Minutes Intravenous Every 24 hours 06/04/21 1910 06/09/21 1759   06/05/21 1800  azithromycin (ZITHROMAX) 500 mg in sodium chloride 0.9 % 250 mL IVPB        500 mg 250 mL/hr over 60 Minutes Intravenous Every 24 hours 06/04/21 1910 06/09/21 1759   06/04/21 1815  cefTRIAXone (ROCEPHIN) 2 g in sodium chloride 0.9 % 100 mL IVPB        2 g 200 mL/hr over 30 Minutes Intravenous  Once 06/04/21 1812 06/04/21 2200   06/04/21 1815  azithromycin (ZITHROMAX) 500 mg in sodium chloride 0.9 % 250 mL IVPB        500 mg 250 mL/hr over 60 Minutes Intravenous  Once 06/04/21 1812 06/04/21 2214         Subjective: Seen and examined.  He states that his breathing is better than yesterday.  Still complains of chest pain especially with the cough.  No other complaint.  Objective: Vitals:   06/05/21 0401 06/05/21 0632 06/05/21 0650 06/05/21 0738  BP: 134/68   (!) 150/75  Pulse: 93 94  99  Resp: '19 12  17  '$ Temp: 99.3 F (37.4 C)   99.8 F (37.7 C)  TempSrc: Oral   Oral  SpO2: 99% 95%  94%  Weight:   100.7 kg   Height:        Intake/Output Summary (Last 24 hours) at 06/05/2021 1115 Last data filed at 06/05/2021 1000 Gross per 24 hour  Intake 960 ml  Output 350 ml  Net 610 ml   Filed Weights   06/04/21 2100 06/05/21 0650  Weight: 100.8 kg 100.7 kg    Examination:  General exam: Appears calm and comfortable  Respiratory system: Scattered rhonchi bilaterally, no wheezes. Respiratory effort normal. Cardiovascular system: S1 & S2 heard, RRR. No JVD, murmurs, rubs, gallops or  clicks. No pedal edema. Gastrointestinal system: Abdomen is nondistended, soft and nontender. No organomegaly or masses felt. Normal bowel sounds heard. Central nervous system: Alert and oriented. No focal neurological deficits. Extremities: Symmetric 5 x 5 power. Skin: No rashes, lesions or ulcers Psychiatry: Judgement and insight appear normal. Mood & affect appropriate.    Data Reviewed: I have personally reviewed following labs and imaging studies  CBC: Recent Labs  Lab 06/04/21 1620 06/05/21 0414  WBC 15.0* 13.0*  HGB 10.2* 9.0*  HCT 30.4* 27.4*  MCV 89.4 90.7  PLT 306 759   Basic Metabolic Panel: Recent Labs  Lab 06/04/21 1620 06/05/21 0414  NA 139 140  K 3.8 3.8  CL 109 113*  CO2 19* 20*  GLUCOSE 169* 128*  BUN 47* 52*  CREATININE 3.43* 3.74*  CALCIUM 8.5* 8.0*   GFR: Estimated Creatinine Clearance: 26.6 mL/min (  A) (by C-G formula based on SCr of 3.74 mg/dL (H)). Liver Function Tests: Recent Labs  Lab 06/05/21 0414  AST 14*  ALT 13  ALKPHOS 61  BILITOT 0.7  PROT 6.1*  ALBUMIN 2.7*   No results for input(s): LIPASE, AMYLASE in the last 168 hours. No results for input(s): AMMONIA in the last 168 hours. Coagulation Profile: No results for input(s): INR, PROTIME in the last 168 hours. Cardiac Enzymes: No results for input(s): CKTOTAL, CKMB, CKMBINDEX, TROPONINI in the last 168 hours. BNP (last 3 results) No results for input(s): PROBNP in the last 8760 hours. HbA1C: No results for input(s): HGBA1C in the last 72 hours. CBG: Recent Labs  Lab 06/04/21 2148 06/05/21 0603  GLUCAP 280* 110*   Lipid Profile: No results for input(s): CHOL, HDL, LDLCALC, TRIG, CHOLHDL, LDLDIRECT in the last 72 hours. Thyroid Function Tests: No results for input(s): TSH, T4TOTAL, FREET4, T3FREE, THYROIDAB in the last 72 hours. Anemia Panel: No results for input(s): VITAMINB12, FOLATE, FERRITIN, TIBC, IRON, RETICCTPCT in the last 72 hours. Sepsis Labs: Recent Labs   Lab 06/05/21 0414  PROCALCITON 1.43    Recent Results (from the past 240 hour(s))  Blood culture (routine x 2)     Status: None (Preliminary result)   Collection Time: 06/04/21  6:17 PM   Specimen: BLOOD LEFT ARM  Result Value Ref Range Status   Specimen Description BLOOD LEFT ARM  Final   Special Requests   Final    BOTTLES DRAWN AEROBIC AND ANAEROBIC Blood Culture results may not be optimal due to an inadequate volume of blood received in culture bottles   Culture   Final    NO GROWTH < 12 HOURS Performed at [facility], [place] [address], [place], [place] [address]    Report Status PENDING  Incomplete  Resp Panel by RT-PCR (Flu A&B, Covid) Peripheral     Status: None   Collection Time: 06/04/21  7:08 PM   Specimen: Peripheral; Nasopharyngeal(NP) swabs in vial transport medium  Result Value Ref Range Status   SARS Coronavirus 2 by RT PCR NEGATIVE NEGATIVE Final    Comment: (NOTE) SARS-CoV-2 target nucleic acids are NOT DETECTED.  The SARS-CoV-2 RNA is generally detectable in upper respiratory specimens during the acute phase of infection. The lowest concentration of SARS-CoV-2 viral copies this assay can detect is 138 copies/mL. A negative result does not preclude SARS-Cov-2 infection and should not be used as the sole basis for treatment or other patient management decisions. A negative result may occur with  improper specimen collection/handling, submission of specimen other than nasopharyngeal swab, presence of viral mutation(s) within the areas targeted by this assay, and inadequate number of viral copies(<138 copies/mL). A negative result must be combined with clinical observations, patient history, and epidemiological information. The expected result is Negative.  Fact Sheet for Patients:  EntrepreneurPulse.com.au  Fact Sheet for Healthcare Providers:  IncredibleEmployment.be  This test is no t yet approved or cleared by  the [place] FDA and  has been authorized for detection and/or diagnosis of SARS-CoV-2 by FDA under an Emergency Use Authorization (EUA). This EUA will remain  in effect (meaning this test can be used) for the duration of the COVID-19 declaration under Section 564(b)(1) of the Act, 21 U.S.C.section 360bbb-3(b)(1), unless the authorization is terminated  or revoked sooner.       Influenza A by PCR NEGATIVE NEGATIVE Final   Influenza B by PCR NEGATIVE NEGATIVE Final    Comment: (NOTE) The  Xpert Xpress SARS-CoV-2/FLU/RSV plus assay is intended as an aid in the diagnosis of influenza from Nasopharyngeal swab specimens and should not be used as a sole basis for treatment. Nasal washings and aspirates are unacceptable for Xpert Xpress SARS-CoV-2/FLU/RSV testing.  Fact Sheet for Patients: EntrepreneurPulse.com.au  Fact Sheet for Healthcare Providers: IncredibleEmployment.be  This test is not yet approved or cleared by the [place] FDA and has been authorized for detection and/or diagnosis of SARS-CoV-2 by FDA under an Emergency Use Authorization (EUA). This EUA will remain in effect (meaning this test can be used) for the duration of the COVID-19 declaration under Section 564(b)(1) of the Act, 21 U.S.C. section 360bbb-3(b)(1), unless the authorization is terminated or revoked.  Performed at [facility], [place] [address] [place], [place] [address]      Radiology Studies: DG Chest 2 View  Result Date: 06/04/2021 CLINICAL DATA:  Chest pain.  Shortness of breath. EXAM: CHEST - 2 VIEW COMPARISON:  February 07, 2021 FINDINGS: Mild patchy opacity in the right mid lower lung. The cardiomediastinal silhouette is stable. No pneumothorax. No nodules or masses. No left-sided infiltrates. IMPRESSION: Mild patchy opacities in the right lung are worrisome for developing pneumonia. Recommend short-term follow-up imaging to ensure resolution.  Electronically Signed   By: Dorise Bullion III M.D.   On: 06/04/2021 17:09    Scheduled Meds:  amLODipine  10 mg Oral Daily   aspirin EC  81 mg Oral Daily   atorvastatin  40 mg Oral Daily   Chlorhexidine Gluconate Cloth  6 each Topical Q0600   enoxaparin (LOVENOX) injection  30 mg Subcutaneous Q24H   escitalopram  20 mg Oral Daily   finasteride  5 mg Oral Daily   gabapentin  200 mg Oral Daily   hydrALAZINE  100 mg Oral TID   insulin aspart  0-15 Units Subcutaneous TID WC   insulin aspart  0-5 Units Subcutaneous QHS   metoprolol succinate  50 mg Oral Daily   nortriptyline  25 mg Oral QHS   pantoprazole  40 mg Oral Daily   sodium chloride flush  3 mL Intravenous Q12H   Continuous Infusions:  azithromycin     cefTRIAXone (ROCEPHIN)  IV       LOS: 0 days   Darliss Cheney, MD [facility]  06/05/2021, 11:15 AM   *Please note that this is a verbal dictation therefore any spelling or grammatical errors are due to the "[employer]" system interpretation.  Please page via Maili and do not message via secure chat for urgent patient care matters. Secure chat can be used for non urgent patient care matters.  How to contact the [facility] Attending or Consulting provider [place] or covering provider during after hours [place], for this patient?  Check the care team in [facility] and look for a) attending/consulting [facility] provider listed and b) the [facility] team listed. Page or secure chat [phone]. Log into [email or web address] and use [place]'s universal password to access. If you do not have the password, please contact the hospital operator. Locate the [facility] provider you are looking for under [facility] and page to a number that you can be directly reached. If you still have difficulty reaching the provider, please page the [facility] (Director on Call) for the Hospitalists listed on amion for assistance.

## 2021-06-05 NOTE — Progress Notes (Signed)
Mobility Specialist: Progress Note   06/05/21 1704  Mobility  Activity Ambulated with assistance in hallway  Level of Assistance Contact guard assist, steadying assist  Assistive Device Front wheel walker  Distance Ambulated (ft) 100 ft  Activity Response Tolerated well  $Mobility charge 1 Mobility   Pre-Mobility on 8 L/min [place]: 83 HR, 96% SpO2 During Mobility on 6 L/min [place]: 98-100% SpO2 Post-Mobility on 4 L/min [place]: 81 HR, 97% SpO2  Pt received in the bed and agreeable to ambulation. Began ambulation on 8 L/min [place] but was able to wean pt to 4 L/min  by end of session with sats maintaining >95% SpO2, RN aware. Distance limited secondary to fatigue and mild SOB. Pt to recliner after session with call bell and phone at his side. Pt's brother present in the room.   [facility] Devory Mckinzie Mobility Specialist Mobility Specialist 5 North: [phone] Mobility Specialist 6 North: [phone]

## 2021-06-06 ENCOUNTER — Inpatient Hospital Stay (HOSPITAL_COMMUNITY): Payer: Medicaid Other

## 2021-06-06 DIAGNOSIS — J189 Pneumonia, unspecified organism: Secondary | ICD-10-CM | POA: Diagnosis not present

## 2021-06-06 DIAGNOSIS — N1832 Chronic kidney disease, stage 3b: Secondary | ICD-10-CM

## 2021-06-06 DIAGNOSIS — N184 Chronic kidney disease, stage 4 (severe): Secondary | ICD-10-CM | POA: Diagnosis not present

## 2021-06-06 DIAGNOSIS — N179 Acute kidney failure, unspecified: Secondary | ICD-10-CM | POA: Diagnosis not present

## 2021-06-06 LAB — URINALYSIS, ROUTINE W REFLEX MICROSCOPIC
Bilirubin Urine: NEGATIVE
Glucose, UA: NEGATIVE mg/dL
Ketones, ur: NEGATIVE mg/dL
Nitrite: NEGATIVE
Protein, ur: 100 mg/dL — AB
Specific Gravity, Urine: 1.016 (ref 1.005–1.030)
WBC, UA: >50 WBC/hpf — ABNORMAL HIGH (ref 0–5)
pH: 5 (ref 5.0–8.0)

## 2021-06-06 LAB — GLUCOSE, CAPILLARY
Glucose-Capillary: 146 mg/dL — ABNORMAL HIGH (ref 70–99)
Glucose-Capillary: 128 mg/dL — ABNORMAL HIGH (ref 70–99)
Glucose-Capillary: 74 mg/dL (ref 70–99)
Glucose-Capillary: 142 mg/dL — ABNORMAL HIGH (ref 70–99)
Glucose-Capillary: 139 mg/dL — ABNORMAL HIGH (ref 70–99)

## 2021-06-06 LAB — CBC WITH DIFFERENTIAL/PLATELET
Abs Immature Granulocytes: 0.06 10*3/uL (ref 0.00–0.07)
Basophils Absolute: 0 10*3/uL (ref 0.0–0.1)
Basophils Relative: 0 %
Eosinophils Absolute: 0 10*3/uL (ref 0.0–0.5)
Eosinophils Relative: 0 %
HCT: 26.5 % — ABNORMAL LOW (ref 39.0–52.0)
Hemoglobin: 9 g/dL — ABNORMAL LOW (ref 13.0–17.0)
Immature Granulocytes: 1 %
Lymphocytes Relative: 9 %
Lymphs Abs: 1.1 10*3/uL (ref 0.7–4.0)
MCH: 30.1 pg (ref 26.0–34.0)
MCHC: 34 g/dL (ref 30.0–36.0)
MCV: 88.6 fL (ref 80.0–100.0)
Monocytes Absolute: 0.7 10*3/uL (ref 0.1–1.0)
Monocytes Relative: 6 %
Neutro Abs: 10.2 10*3/uL — ABNORMAL HIGH (ref 1.7–7.7)
Neutrophils Relative %: 84 %
Platelets: 246 10*3/uL (ref 150–400)
RBC: 2.99 MIL/uL — ABNORMAL LOW (ref 4.22–5.81)
RDW: 14.2 % (ref 11.5–15.5)
WBC: 12 10*3/uL — ABNORMAL HIGH (ref 4.0–10.5)
nRBC: 0 % (ref 0.0–0.2)

## 2021-06-06 LAB — BASIC METABOLIC PANEL
Anion gap: 8 (ref 5–15)
BUN: 62 mg/dL — ABNORMAL HIGH (ref 6–20)
CO2: 18 mmol/L — ABNORMAL LOW (ref 22–32)
Calcium: 7.9 mg/dL — ABNORMAL LOW (ref 8.9–10.3)
Chloride: 104 mmol/L (ref 98–111)
Creatinine, Ser: 4.35 mg/dL — ABNORMAL HIGH (ref 0.61–1.24)
GFR, Estimated: 15 mL/min — ABNORMAL LOW (ref >60)
Glucose, Bld: 146 mg/dL — ABNORMAL HIGH (ref 70–99)
Potassium: 3.6 mmol/L (ref 3.5–5.1)
Sodium: 130 mmol/L — ABNORMAL LOW (ref 135–145)

## 2021-06-06 LAB — MAGNESIUM: Magnesium: 1.7 mg/dL (ref 1.7–2.4)

## 2021-06-06 LAB — CREATININE, URINE, RANDOM: Creatinine, Urine: 193.71 mg/dL

## 2021-06-06 LAB — SODIUM, URINE, RANDOM: Sodium, Ur: 12 mmol/L

## 2021-06-06 MED ORDER — FUROSEMIDE 10 MG/ML IJ SOLN
80.0000 mg | Freq: Once | INTRAMUSCULAR | Status: AC
  Administered 2021-06-06: 80 mg via INTRAVENOUS
  Filled 2021-06-06: qty 8

## 2021-06-06 MED ORDER — AZITHROMYCIN 500 MG PO TABS
500.0000 mg | ORAL_TABLET | Freq: Every day | ORAL | Status: AC
  Administered 2021-06-06 – 2021-06-08 (×3): 500 mg via ORAL
  Filled 2021-06-06 (×4): qty 1

## 2021-06-06 NOTE — Progress Notes (Signed)
PHARMACIST - PHYSICIAN COMMUNICATION  CONCERNING: IV to Oral Route Change Policy  RECOMMENDATION: This patient is receiving azithromycin by the intravenous route.  Based on criteria approved by the Pharmacy and Therapeutics Committee, the intravenous medication(s) is/are being converted to the equivalent oral dose form(s).   DESCRIPTION: These criteria include: The patient is eating (either orally or via tube) and/or has been taking other orally administered medications for a least 24 hours The patient has no evidence of active gastrointestinal bleeding or impaired GI absorption (gastrectomy, short bowel, patient on TNA or NPO).  If you have questions about this conversion, please contact the Pharmacy Department  '[]'$   [phone] )  [facility] '[]'$   [phone] )  [facility] '[x]'$   [phone] )  [facility] '[]'$   [phone] )  [facility] '[]'$   [phone] )  [place], PharmD, BCPS Clinical Pharmacist 06/06/2021 11:16 AM

## 2021-06-06 NOTE — Consult Note (Signed)
Reason for Consult: Renal failure Referring Physician: Dr. 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 Patient has also had significant proteinuria already in 2021. 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 Relatively normal renal ultrasoudn 02/05/2021. Patient only net neg 290 ml during this hospitalization. On urine microscopy there are florid WBC's in the setting of a foley that was just placed last week, some coarse granular casts but otherwise not an active sediment. - No absolute indication for RRT and the patient appears to be  comfortable. - Will send  urine lytes, urinalysis, renal ultrasound for cortical thickness and size of kidneys, less likely to be obstructive with foley in place. - Will also dose Lasix 46m IV x1 and monitor response. - Monitor Daily I/Os, Daily weights  -Maintain MAP>65 for optimal renal perfusion.  -Avoid nephrotoxic medications including NSAIDs, contrast  Hypertension - resumed home regimen. HFpEF - decompensated, see above. DM - per primary CASHD BPH on flomax and proxcar     HPI: Ryan KNISLEYis an 61y.o. male HTN, DM with evidence of end organ damage with neuropathy + gastroparesis + retinopathy, HLD, HFpEF, CASHD, BPH, CKD4 presenting with shortness of breath with chest pain, nausea and vomiting. He did receive nitroglycerin with EMS.  He is on Lasix 416mtwice daily at home and also has a foley placed by urology.   In the ED he required 2L O2 supplementation, BUN Cr 45/3.43 with possible RLL pneumonia on CXR. Patient treated with ceftriaxone and azithromycin. Influenza and COVID were negative. BNP noted to be 1151. Patient received Lasix 40108m1 on 5/22.   ROS Pertinent items are noted in HPI.  Chemistry and  CBC: Creatinine, Ser  Date/Time Value Ref Range Status  06/06/2021 04:46 AM 4.35 (H) 0.61 - 1.24 mg/dL Final  06/05/2021 04:14 AM 3.74 (H) 0.61 - 1.24 mg/dL Final  06/04/2021 04:20 PM 3.43 (H) 0.61 - 1.24 mg/dL Final  02/08/2021 04:28 AM 2.73 (H) 0.61 - 1.24 mg/dL Final  02/07/2021 04:38 AM 2.76 (H) 0.61 - 1.24 mg/dL Final  02/06/2021 04:23 AM 2.88 (H) 0.61 - 1.24 mg/dL Final  02/05/2021 04:37 AM 3.31 (H) 0.61 - 1.24 mg/dL Final  02/04/2021 04:44 AM 3.57 (H) 0.61 - 1.24 mg/dL Final  02/03/2021 04:25 AM 3.43 (H) 0.61 - 1.24 mg/dL Final    Comment:    DELTA CHECK NOTED  02/02/2021 04:59 AM 2.19 (H) 0.61 - 1.24 mg/dL Final  02/01/2021 03:08 PM 2.80 (H) 0.61 - 1.24 mg/dL Final  01/31/2021 07:25 PM 2.81 (H) 0.61 - 1.24 mg/dL Final  12/10/2020 10:30 AM 2.94 (H) 0.61 - 1.24 mg/dL Final  08/04/2020 02:42 AM 2.69 (H) 0.61 - 1.24 mg/dL Final  08/03/2020 11:55 AM 2.65 (H) 0.61 - 1.24 mg/dL Final  07/13/2020 12:08 PM 2.29 (H) 0.76 - 1.27 mg/dL Final  07/09/2020 04:03 AM 2.55 (H) 0.61 - 1.24 mg/dL Final  07/08/2020 09:38 AM 2.25 (H) 0.61 - 1.24 mg/dL Final  07/07/2020 10:04 AM 2.33 (H) 0.61 - 1.24 mg/dL Final  07/06/2020 07:01 PM 2.58 (H) 0.61 - 1.24 mg/dL Final  08/07/2019 12:15 PM 1.85 (H) 0.76 - 1.27 mg/dL Final  07/24/2019 05:03 AM 1.81 (H) 0.61 - 1.24 mg/dL Final  07/23/2019 02:20 AM 1.82 (H) 0.61 - 1.24 mg/dL Final  07/22/2019 05:11  PM 2.04 (H) 0.61 - 1.24 mg/dL Final  12/25/2018 11:11 AM 1.94 (H) 0.76 - 1.27 mg/dL Final  07/03/2018 10:16 AM 1.63 (H) 0.76 - 1.27 mg/dL Final  02/25/2018 04:57 PM 1.47 (H) 0.76 - 1.27 mg/dL Final  08/07/2017 02:24 PM 1.07 0.76 - 1.27 mg/dL Final  06/18/2017 03:17 PM 1.20 0.61 - 1.24 mg/dL Final  03/21/2017 10:14 AM 0.82 0.76 - 1.27 mg/dL Final  02/05/2017 10:42 AM 1.07 0.76 - 1.27 mg/dL Final  01/12/2016 08:57 PM 1.13 0.61 - 1.24 mg/dL Final  01/12/2016 04:03 PM 1.38 (H) 0.61 - 1.24 mg/dL Final  01/12/2016 02:11 PM 1.10 0.61 - 1.24 mg/dL Final   Recent  Labs  Lab 06/04/21 1620 06/05/21 0414 06/06/21 0446  NA 139 140 130*  K 3.8 3.8 3.6  CL 109 113* 104  CO2 19* 20* 18*  GLUCOSE 169* 128* 146*  BUN 47* 52* 62*  CREATININE 3.43* 3.74* 4.35*  CALCIUM 8.5* 8.0* 7.9*   Recent Labs  Lab 06/04/21 1620 06/05/21 0414 06/06/21 0446  WBC 15.0* 13.0* 12.0*  NEUTROABS  --   --  10.2*  HGB 10.2* 9.0* 9.0*  HCT 30.4* 27.4* 26.5*  MCV 89.4 90.7 88.6  PLT 306 249 246   Liver Function Tests: Recent Labs  Lab 06/05/21 0414  AST 14*  ALT 13  ALKPHOS 61  BILITOT 0.7  PROT 6.1*  ALBUMIN 2.7*   No results for input(s): LIPASE, AMYLASE in the last 168 hours. No results for input(s): AMMONIA in the last 168 hours. Cardiac Enzymes: No results for input(s): CKTOTAL, CKMB, CKMBINDEX, TROPONINI in the last 168 hours. Iron Studies: No results for input(s): IRON, TIBC, TRANSFERRIN, FERRITIN in the last 72 hours. PT/INR: _0 (inr:5)  Xrays/Other Studies: ) Results for orders placed or performed during the hospital encounter of 06/04/21 (from the past 48 hour(s))  Basic metabolic panel     Status: Abnormal   Collection Time: 06/04/21  4:20 PM  Result Value Ref Range   Sodium 139 135 - 145 mmol/L   Potassium 3.8 3.5 - 5.1 mmol/L   Chloride 109 98 - 111 mmol/L   CO2 19 (L) 22 - 32 mmol/L   Glucose, Bld 169 (H) 70 - 99 mg/dL    Comment: Glucose reference range applies only to samples taken after fasting for at least 8 hours.   BUN 47 (H) 6 - 20 mg/dL   Creatinine, Ser 3.43 (H) 0.61 - 1.24 mg/dL   Calcium 8.5 (L) 8.9 - 10.3 mg/dL   GFR, Estimated 20 (L) >60 mL/min    Comment: (NOTE) Calculated using the CKD-EPI Creatinine Equation (2021)    Anion gap 11 5 - 15    Comment: Performed at [facility] [address], [place], [place] [address]  CBC     Status: Abnormal   Collection Time: 06/04/21  4:20 PM  Result Value Ref Range   WBC 15.0 (H) 4.0 - 10.5 K/uL   RBC 3.40 (L) 4.22 - 5.81 MIL/uL   Hemoglobin 10.2 (L) 13.0 -  17.0 g/dL   HCT 30.4 (L) 39.0 - 52.0 %   MCV 89.4 80.0 - 100.0 fL   MCH 30.0 26.0 - 34.0 pg   MCHC 33.6 30.0 - 36.0 g/dL   RDW 14.0 11.5 - 15.5 %   Platelets 306 150 - 400 K/uL   nRBC 0.0 0.0 - 0.2 %    Comment: Performed at [facility], [place] [address], [place], [place] [address]  Troponin  I (High Sensitivity)     Status: Abnormal   Collection Time: 06/04/21  4:20 PM  Result Value Ref Range   Troponin I (High Sensitivity) 76 (H) <18 ng/L    Comment: (NOTE) Elevated high sensitivity troponin I (hsTnI) values and significant  changes across serial measurements may suggest ACS but many other  chronic and acute conditions are known to elevate hsTnI results.  Refer to the "Links" section for chest pain algorithms and additional  guidance. Performed at [facility], [place] [address], [place], [place] [address]   Brain natriuretic peptide     Status: Abnormal   Collection Time: 06/04/21  4:20 PM  Result Value Ref Range   B Natriuretic Peptide 1,151.1 (H) 0.0 - 100.0 pg/mL    Comment: Performed at [facility] [address] [place], [place] [address]  Blood culture (routine x 2)     Status: None (Preliminary result)   Collection Time: 06/04/21  6:17 PM   Specimen: BLOOD LEFT ARM  Result Value Ref Range   Specimen Description BLOOD LEFT ARM    Special Requests      BOTTLES DRAWN AEROBIC AND ANAEROBIC Blood Culture results may not be optimal due to an inadequate volume of blood received in culture bottles   Culture      NO GROWTH 2 DAYS Performed at [facility], [place] [address] [place], [place] [address]    Report Status PENDING   Resp Panel by RT-PCR (Flu A&B, Covid) Peripheral     Status: None   Collection Time: 06/04/21  7:08 PM   Specimen: Peripheral; Nasopharyngeal(NP) swabs in vial transport medium  Result Value Ref Range   SARS Coronavirus 2 by RT PCR NEGATIVE NEGATIVE    Comment: (NOTE) SARS-CoV-2 target nucleic acids are NOT DETECTED.  The  SARS-CoV-2 RNA is generally detectable in upper respiratory specimens during the acute phase of infection. The lowest concentration of SARS-CoV-2 viral copies this assay can detect is 138 copies/mL. A negative result does not preclude SARS-Cov-2 infection and should not be used as the sole basis for treatment or other patient management decisions. A negative result may occur with  improper specimen collection/handling, submission of specimen other than nasopharyngeal swab, presence of viral mutation(s) within the areas targeted by this assay, and inadequate number of viral copies(<138 copies/mL). A negative result must be combined with clinical observations, patient history, and epidemiological information. The expected result is Negative.  Fact Sheet for Patients:  EntrepreneurPulse.com.au  Fact Sheet for Healthcare Providers:  IncredibleEmployment.be  This test is no t yet approved or cleared by the [place] FDA and  has been authorized for detection and/or diagnosis of SARS-CoV-2 by FDA under an Emergency Use Authorization (EUA). This EUA will remain  in effect (meaning this test can be used) for the duration of the COVID-19 declaration under Section 564(b)(1) of the Act, 21 U.S.C.section 360bbb-3(b)(1), unless the authorization is terminated  or revoked sooner.       Influenza A by PCR NEGATIVE NEGATIVE   Influenza B by PCR NEGATIVE NEGATIVE    Comment: (NOTE) The Xpert Xpress SARS-CoV-2/FLU/RSV plus assay is intended as an aid in the diagnosis of influenza from Nasopharyngeal swab specimens and should not be used as a sole basis for treatment. Nasal washings and aspirates are unacceptable for Xpert Xpress SARS-CoV-2/FLU/RSV testing.  Fact Sheet for Patients: EntrepreneurPulse.com.au  Fact Sheet for Healthcare Providers: IncredibleEmployment.be  This test is not yet approved or cleared by the  [place] Architectural technologist and has been authorized for detection and/or diagnosis of SARS-CoV-2 by FDA under an Print production planner (EUA). This EUA will remain in effect (meaning this test can be used) for the duration of the COVID-19 declaration under Section 564(b)(1) of the Act, 21 U.S.C. section 360bbb-3(b)(1), unless the authorization is terminated or revoked.  Performed at [facility], [place] [address] [place], [place] [address]   Troponin I (High Sensitivity)     Status: Abnormal   Collection Time: 06/04/21  7:19 PM  Result Value Ref Range   Troponin I (High Sensitivity) 94 (H) <18 ng/L    Comment: (NOTE) Elevated high sensitivity troponin I (hsTnI) values and significant  changes across serial measurements may suggest ACS but many other  chronic and acute conditions are known to elevate hsTnI results.  Refer to the "Links" section for chest pain algorithms and additional  guidance. Performed at [facility], [place] [address], [place], [place] [address]   Blood culture (routine x 2)     Status: None (Preliminary result)   Collection Time: 06/04/21  7:19 PM   Specimen: BLOOD RIGHT ARM  Result Value Ref Range   Specimen Description BLOOD RIGHT ARM    Special Requests      BOTTLES DRAWN AEROBIC AND ANAEROBIC Blood Culture adequate volume   Culture      NO GROWTH 2 DAYS Performed at [facility], [place] [address], [place], [place] [address]    Report Status PENDING   Glucose, capillary     Status: Abnormal   Collection Time: 06/04/21  9:48 PM  Result Value Ref Range   Glucose-Capillary 280 (H) 70 - 99 mg/dL    Comment: Glucose reference range applies only to samples taken after fasting for at least 8 hours.  Comprehensive metabolic panel     Status: Abnormal   Collection Time: 06/05/21  4:14 AM  Result Value Ref Range   Sodium 140 135 - 145 mmol/L   Potassium 3.8 3.5 - 5.1 mmol/L   Chloride 113 (H) 98 - 111 mmol/L   CO2 20 (L) 22 - 32 mmol/L   Glucose,  Bld 128 (H) 70 - 99 mg/dL    Comment: Glucose reference range applies only to samples taken after fasting for at least 8 hours.   BUN 52 (H) 6 - 20 mg/dL   Creatinine, Ser 3.74 (H) 0.61 - 1.24 mg/dL   Calcium 8.0 (L) 8.9 - 10.3 mg/dL   Total Protein 6.1 (L) 6.5 - 8.1 g/dL   Albumin 2.7 (L) 3.5 - 5.0 g/dL   AST 14 (L) 15 - 41 U/L   ALT 13 0 - 44 U/L   Alkaline Phosphatase 61 38 - 126 U/L   Total Bilirubin 0.7 0.3 - 1.2 mg/dL   GFR, Estimated 18 (L) >60 mL/min    Comment: (NOTE) Calculated using the CKD-EPI Creatinine Equation (2021)    Anion gap 7 5 - 15    Comment: Performed at [facility], [place] [address] [place], [place] [address]  CBC     Status: Abnormal   Collection Time: 06/05/21  4:14 AM  Result Value Ref Range   WBC 13.0 (H) 4.0 - 10.5 K/uL   RBC 3.02 (L) 4.22 - 5.81 MIL/uL   Hemoglobin 9.0 (L) 13.0 - 17.0 g/dL   HCT 27.4 (L) 39.0 - 52.0 %   MCV 90.7 80.0 - 100.0 fL   MCH 29.8 26.0 - 34.0 pg  MCHC 32.8 30.0 - 36.0 g/dL   RDW 14.2 11.5 - 15.5 %   Platelets 249 150 - 400 K/uL   nRBC 0.0 0.0 - 0.2 %    Comment: Performed at [facility], [place] [address], [place], [place] [address]  Procalcitonin - Baseline     Status: None   Collection Time: 06/05/21  4:14 AM  Result Value Ref Range   Procalcitonin 1.43 ng/mL    Comment:        Interpretation: PCT > 0.5 ng/mL and <= 2 ng/mL: Systemic infection (sepsis) is possible, but other conditions are known to elevate PCT as well. (NOTE)       Sepsis PCT Algorithm           Lower Respiratory Tract                                      Infection PCT Algorithm    ----------------------------     ----------------------------         PCT < 0.25 ng/mL                PCT < 0.10 ng/mL          Strongly encourage             Strongly discourage   discontinuation of antibiotics    initiation of antibiotics    ----------------------------     -----------------------------       PCT 0.25 - 0.50 ng/mL            PCT 0.10 -  0.25 ng/mL               OR       >80% decrease in PCT            Discourage initiation of                                            antibiotics      Encourage discontinuation           of antibiotics    ----------------------------     -----------------------------         PCT >= 0.50 ng/mL              PCT 0.26 - 0.50 ng/mL                AND       <80% decrease in PCT             Encourage initiation of                                             antibiotics       Encourage continuation           of antibiotics    ----------------------------     -----------------------------        PCT >= 0.50 ng/mL                  PCT > 0.50 ng/mL               AND         increase in PCT  Strongly encourage                                      initiation of antibiotics    Strongly encourage escalation           of antibiotics                                     -----------------------------                                           PCT <= 0.25 ng/mL                                                 OR                                        > 80% decrease in PCT                                      Discontinue / Do not initiate                                             antibiotics  Performed at [facility], [place] [address], [place], [place] [address]   Hemoglobin A1c     Status: Abnormal   Collection Time: 06/05/21  4:14 AM  Result Value Ref Range   Hgb A1c MFr Bld 7.1 (H) 4.8 - 5.6 %    Comment: (NOTE) Pre diabetes:          5.7%-6.4%  Diabetes:              >6.4%  Glycemic control for   <7.0% adults with diabetes    Mean Plasma Glucose 157.07 mg/dL    Comment: Performed at [facility] [address], [place], [place] [address]  Glucose, capillary     Status: Abnormal   Collection Time: 06/05/21  6:03 AM  Result Value Ref Range   Glucose-Capillary 110 (H) 70 - 99 mg/dL    Comment: Glucose reference range applies only to samples taken after fasting  for at least 8 hours.   Comment 1 Notify RN    Comment 2 Document in Chart   Strep pneumoniae urinary antigen     Status: None   Collection Time: 06/05/21 11:18 AM  Result Value Ref Range   Strep Pneumo Urinary Antigen NEGATIVE NEGATIVE    Comment:        Infection due to S. pneumoniae cannot be absolutely ruled out since the antigen present may be below the detection limit of the test. Performed at [facility], [address] [place], [place] [address]   Glucose, capillary     Status: Abnormal   Collection Time: 06/05/21 11:31  AM  Result Value Ref Range   Glucose-Capillary 165 (H) 70 - 99 mg/dL    Comment: Glucose reference range applies only to samples taken after fasting for at least 8 hours.  Glucose, capillary     Status: Abnormal   Collection Time: 06/05/21  4:29 PM  Result Value Ref Range   Glucose-Capillary 149 (H) 70 - 99 mg/dL    Comment: Glucose reference range applies only to samples taken after fasting for at least 8 hours.   Comment 1 Notify RN    Comment 2 Document in Chart   Glucose, capillary     Status: Abnormal   Collection Time: 06/05/21  8:46 PM  Result Value Ref Range   Glucose-Capillary 169 (H) 70 - 99 mg/dL    Comment: Glucose reference range applies only to samples taken after fasting for at least 8 hours.  CBC with Differential/Platelet     Status: Abnormal   Collection Time: 06/06/21  4:46 AM  Result Value Ref Range   WBC 12.0 (H) 4.0 - 10.5 K/uL   RBC 2.99 (L) 4.22 - 5.81 MIL/uL   Hemoglobin 9.0 (L) 13.0 - 17.0 g/dL   HCT 26.5 (L) 39.0 - 52.0 %   MCV 88.6 80.0 - 100.0 fL   MCH 30.1 26.0 - 34.0 pg   MCHC 34.0 30.0 - 36.0 g/dL   RDW 14.2 11.5 - 15.5 %   Platelets 246 150 - 400 K/uL   nRBC 0.0 0.0 - 0.2 %   Neutrophils Relative % 84 %   Neutro Abs 10.2 (H) 1.7 - 7.7 K/uL   Lymphocytes Relative 9 %   Lymphs Abs 1.1 0.7 - 4.0 K/uL   Monocytes Relative 6 %   Monocytes Absolute 0.7 0.1 - 1.0 K/uL   Eosinophils Relative 0 %   Eosinophils  Absolute 0.0 0.0 - 0.5 K/uL   Basophils Relative 0 %   Basophils Absolute 0.0 0.0 - 0.1 K/uL   Immature Granulocytes 1 %   Abs Immature Granulocytes 0.06 0.00 - 0.07 K/uL    Comment: Performed at [facility], [address], [place], [place] [address]  Basic metabolic panel     Status: Abnormal   Collection Time: 06/06/21  4:46 AM  Result Value Ref Range   Sodium 130 (L) 135 - 145 mmol/L   Potassium 3.6 3.5 - 5.1 mmol/L   Chloride 104 98 - 111 mmol/L   CO2 18 (L) 22 - 32 mmol/L   Glucose, Bld 146 (H) 70 - 99 mg/dL    Comment: Glucose reference range applies only to samples taken after fasting for at least 8 hours.   BUN 62 (H) 6 - 20 mg/dL   Creatinine, Ser 4.35 (H) 0.61 - 1.24 mg/dL   Calcium 7.9 (L) 8.9 - 10.3 mg/dL   GFR, Estimated 15 (L) >60 mL/min    Comment: (NOTE) Calculated using the CKD-EPI Creatinine Equation (2021)    Anion gap 8 5 - 15    Comment: Performed at [facility] [address] [place], [place] [address]  Magnesium     Status: None   Collection Time: 06/06/21  4:46 AM  Result Value Ref Range   Magnesium 1.7 1.7 - 2.4 mg/dL    Comment: Performed at [facility] [address], [place], [place] [address]  Glucose, capillary     Status: Abnormal   Collection Time: 06/06/21  6:25 AM  Result Value Ref Range   Glucose-Capillary 146 (H)  70 - 99 mg/dL    Comment: Glucose reference range applies only to samples taken after fasting for at least 8 hours.   DG Chest 2 View  Result Date: 06/04/2021 CLINICAL DATA:  Chest pain.  Shortness of breath. EXAM: CHEST - 2 VIEW COMPARISON:  February 07, 2021 FINDINGS: Mild patchy opacity in the right mid lower lung. The cardiomediastinal silhouette is stable. No pneumothorax. No nodules or masses. No left-sided infiltrates. IMPRESSION: Mild patchy opacities in the right lung are worrisome for developing pneumonia. Recommend short-term follow-up imaging to ensure resolution. Electronically Signed   By: Dorise Bullion III M.D.   On: 06/04/2021 17:09   DG CHEST PORT 1 VIEW  Result Date: 06/05/2021 CLINICAL DATA:  Shortness of breath. History of congestive heart failure, diabetes, hypertension EXAM: PORTABLE CHEST 1 VIEW COMPARISON:  Chest x-ray from yesterday FINDINGS: Mild cardiomegaly. There are confluent opacities seen at the perihilar regions greater on the right side predominantly in the right middle and lower lobes likely on the basis of worsening pulmonary edema. Superimposed pneumonia cannot be entirely excluded. No pleural effusion. The visualized skeletal structures are unremarkable. IMPRESSION: Perihilar batwing kind of opacities greater on the right likely on the basis of pulmonary edema and has worsened in the interim. Superimposed pneumonia cannot be excluded. Electronically Signed   By: Frazier Richards M.D.   On: 06/05/2021 13:01   ECHOCARDIOGRAM COMPLETE  Result Date: 06/05/2021    ECHOCARDIOGRAM REPORT   Patient Name:   Ryan Jenkins Date of Exam: 06/05/2021 Medical Rec #:  [id number]         Height:       74.0 in Accession #:    [id number]        Weight:       222.0 lb Date of Birth:  Aug 25, 1960         BSA:          2.272 m Patient Age:    61 years          BP:           134/68 mmHg Patient Gender: M                 HR:           103 bpm. Exam Location:  Inpatient Procedure: 2D Echo, Color Doppler and Cardiac Doppler Indications:    CHF  History:        Patient has prior history of Echocardiogram examinations. CHF,                 CAD; Risk Factors:Hypertension and Diabetes.  Sonographer:    Jyl Heinz Referring Phys: [id number] RAVI PAHWANI IMPRESSIONS  1. Left ventricular ejection fraction, by estimation, is 60 to 65%. The left ventricle has normal function. The left ventricle has no regional wall motion abnormalities. There is moderate concentric left ventricular hypertrophy. Left ventricular diastolic parameters are consistent with Grade II diastolic dysfunction (pseudonormalization).  2.  Right ventricular systolic function is normal. The right ventricular size is normal.  3. The mitral valve is normal in structure. Trivial mitral valve regurgitation.  4. The aortic valve is tricuspid. There is mild calcification of the aortic valve. There is mild thickening of the aortic valve. Aortic valve regurgitation is not visualized. Aortic valve sclerosis/calcification is present, without any evidence of aortic stenosis.  5. The inferior vena cava is normal in size with greater than 50% respiratory variability, suggesting right atrial pressure of 3 mmHg. Comparison(s): Compared  to prior TTE in 01/2021, there is no significant change. FINDINGS  Left Ventricle: Left ventricular ejection fraction, by estimation, is 60 to 65%. The left ventricle has normal function. The left ventricle has no regional wall motion abnormalities. The left ventricular internal cavity size was normal in size. There is  moderate concentric left ventricular hypertrophy. Left ventricular diastolic parameters are consistent with Grade II diastolic dysfunction (pseudonormalization). Right Ventricle: The right ventricular size is normal. No increase in right ventricular wall thickness. Right ventricular systolic function is normal. Left Atrium: Left atrial size was normal in size. Right Atrium: Right atrial size was normal in size. Pericardium: Trivial pericardial effusion is present. Mitral Valve: The mitral valve is normal in structure. There is mild thickening of the mitral valve leaflet(s). Trivial mitral valve regurgitation. Tricuspid Valve: The tricuspid valve is normal in structure. Tricuspid valve regurgitation is trivial. Aortic Valve: The aortic valve is tricuspid. There is mild calcification of the aortic valve. There is mild thickening of the aortic valve. Aortic valve regurgitation is not visualized. Aortic valve sclerosis/calcification is present, without any evidence of aortic stenosis. Aortic valve peak gradient measures 6.9  mmHg. Pulmonic Valve: The pulmonic valve was normal in structure. Pulmonic valve regurgitation is trivial. Aorta: The aortic root and ascending aorta are structurally normal, with no evidence of dilitation. Venous: The inferior vena cava is normal in size with greater than 50% respiratory variability, suggesting right atrial pressure of 3 mmHg. IAS/Shunts: The atrial septum is grossly normal.  LEFT VENTRICLE PLAX 2D LVIDd:         5.00 cm      Diastology LVIDs:         3.40 cm      LV e' medial:    6.64 cm/s LV PW:         1.00 cm      LV E/e' medial:  18.5 LV IVS:        1.20 cm      LV e' lateral:   8.81 cm/s LVOT diam:     2.10 cm      LV E/e' lateral: 14.0 LV SV:         55 LV SV Index:   24 LVOT Area:     3.46 cm  LV Volumes (MOD) LV vol d, MOD A2C: 145.0 ml LV vol d, MOD A4C: 153.0 ml LV vol s, MOD A2C: 62.7 ml LV vol s, MOD A4C: 65.5 ml LV SV MOD A2C:     82.3 ml LV SV MOD A4C:     153.0 ml LV SV MOD BP:      85.5 ml RIGHT VENTRICLE            IVC RV Basal diam:  3.70 cm    IVC diam: 1.70 cm RV S prime:     9.95 cm/s TAPSE (M-mode): 1.8 cm LEFT ATRIUM           Index        RIGHT ATRIUM           Index LA diam:      4.50 cm 1.98 cm/m   RA Area:     15.50 cm LA Vol (A2C): 56.8 ml 25.00 ml/m  RA Volume:   39.20 ml  17.25 ml/m LA Vol (A4C): 71.2 ml 31.34 ml/m  AORTIC VALVE AV Area (Vmax): 2.48 cm AV Vmax:        131.00 cm/s AV Peak Grad:   6.9 mmHg LVOT Vmax:  93.80 cm/s LVOT Vmean:     71.300 cm/s LVOT VTI:       0.159 m  AORTA Ao Root diam: 3.40 cm Ao Asc diam:  3.30 cm MITRAL VALVE MV Area (PHT): 6.07 cm     SHUNTS MV Decel Time: 125 msec     Systemic VTI:  0.16 m MV E velocity: 123.00 cm/s  Systemic Diam: 2.10 cm MV A velocity: 66.10 cm/s MV E/A ratio:  1.86 Gwyndolyn Kaufman MD Electronically signed by Gwyndolyn Kaufman MD Signature Date/Time: 06/05/2021/2:51:43 PM    Final     PMH:   Past Medical History:  Diagnosis Date   Anemia    Anxiety    BPH (benign prostatic hyperplasia)    CAD  (coronary artery disease)    Chronic diastolic CHF (congestive heart failure) (HCC)    Chronic kidney disease, stage IV (severe) (HCC)    Depression    Diabetes mellitus with complication (HCC)    Diabetic neuropathy ([place])    GERD (gastroesophageal reflux disease)    Hypertension    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 Authorizing Provider  acetaminophen (TYLENOL) 500 MG tablet Take 1,000 mg by mouth every 6 (six) hours as needed for mild pain or headache.   Yes [provider]  amLODipine (NORVASC) 10 MG tablet Take 1 tablet (10 mg total) by mouth daily. 02/08/21  Yes Danford, Suann Larry, MD  aspirin EC 81 MG EC tablet Take 1 tablet (81 mg total) by mouth daily. Swallow whole. 02/09/21  Yes Danford, Suann Larry, MD  atorvastatin (LIPITOR) 40 MG tablet Take 1 tablet (40 mg total) by mouth daily. 02/08/21  Yes Danford, Suann Larry, MD  carvedilol (COREG) 6.25 MG tablet Take 6.25 mg by mouth 2 (two) times daily. 05/13/21  Yes [provider]  D-Mannose 350 MG CAPS Take 1,050 capsules by mouth in the morning and at bedtime.   Yes [provider]  escitalopram (LEXAPRO) 20 MG tablet Take 1 tablet (20 mg total) by mouth daily. 07/20/20  Yes Charlott Rakes, MD  finasteride (PROSCAR) 5 MG tablet Take 1 tablet (5 mg total) by mouth daily. 07/20/20  Yes Charlott Rakes, MD   furosemide (LASIX) 40 MG tablet Take 1 tablet (40 mg total) by mouth 2 (two) times daily. 02/08/21  Yes Danford, Suann Larry, MD  gabapentin (NEURONTIN) 300 MG capsule Take 300 mg by mouth 3 (three) times daily. 05/26/21  Yes [provider]  glipiZIDE (GLUCOTROL) 5 MG tablet Take 5 mg by mouth daily. 05/25/21  Yes [provider]  hydrALAZINE (APRESOLINE) 100 MG tablet Take 1 tablet (100 mg total) by mouth 3 (three) times daily. 02/08/21  Yes Danford, Suann Larry, MD  LANTUS SOLOSTAR 100 UNIT/ML Solostar Pen Inject 40 Units into the skin at bedtime. 05/25/21  Yes [provider]  lisinopril (ZESTRIL) 10 MG tablet Take 10 mg by mouth daily. 04/24/21  Yes [provider]  metoprolol succinate (TOPROL-XL) 50 MG 24 hr tablet Take 1 tablet (50 mg total) by mouth at bedtime. Take with or immediately following a meal. 02/08/21  Yes Danford, Suann Larry, MD  nortriptyline (PAMELOR) 25 MG capsule Take 1 capsule (25 mg total) by mouth at bedtime. 07/20/20  Yes Charlott Rakes, MD  pantoprazole (PROTONIX) 40 MG tablet Take 1 tablet (40 mg total) by mouth daily. 02/08/21  Yes Danford, Suann Larry, MD  potassium chloride (KLOR-CON M) 10 MEQ tablet Take 1 tablet (10 mEq total) by mouth daily. 02/08/21  Yes Danford, Suann Larry, MD  tamsulosin (FLOMAX) 0.4 MG CAPS capsule Take 0.4 mg by mouth at bedtime. 05/25/21  Yes [provider]  traZODone (DESYREL) 50 MG tablet Take 3 tablets (150 mg total) by mouth at bedtime. Patient taking differently: Take 150 mg by mouth at bedtime as needed for sleep. 07/20/20  Yes Charlott Rakes, MD  Blood Glucose Monitoring Suppl (TRUE METRIX METER) w/Device KIT Use as directed 07/20/20   Ladell Pier, MD  Blood Pressure Monitor DEVI Use as directed to check home blood pressure 2-3 times a week 07/13/20   Camillia Herter, NP  glucose blood (TRUE METRIX BLOOD GLUCOSE TEST) test strip Use as instructed 07/20/20   Ladell Pier, MD   Insulin Syringe-Needle U-100 (TRUEPLUS INSULIN SYRINGE) 31G X 5/16" 0.3 ML MISC Use to inject Levemir at bedtime. 07/20/20   Charlott Rakes, MD  TRUEplus Lancets 28G MISC Use as directed 07/20/20   Ladell Pier, MD    Discontinued Meds:   Medications Discontinued During This Encounter  Medication Reason   gabapentin (NEURONTIN) 100 MG capsule Dose change   valACYclovir (VALTREX) 1000 MG tablet Completed Course   oxyCODONE-acetaminophen (PERCOCET/ROXICET) 5-325 MG tablet Completed Course    Social History:  reports that he has never smoked. He has never used smokeless tobacco. He reports that he does not drink alcohol and does not use drugs.  Family History:   Family History  Problem Relation Age of Onset   Renal cancer Mother    Hypertension Mother    Pancreatic cancer Mother    Hypertension Sister    Stroke Sister    Leukemia Maternal Uncle    Sickle cell trait Maternal Aunt    Colon cancer Neg Hx    Esophageal cancer Neg Hx    Rectal cancer Neg Hx    Stomach cancer Neg Hx     Blood pressure (!) 158/72, pulse (!) 101, temperature 99.5 F (37.5 C), temperature source Oral, resp. rate 17, height 6' 2" (1.88 m), weight 102.3 kg, SpO2 100 %. Gen alert, no distress, nasal O2  No carotid bruits Chest clear bilat to bases RRR no RG Abd soft ntnd no mass or ascites +bs GU foley in place Ext no pretib or UE edema, trace-1+ hip edema Neuro is alert, Ox 3 , nf, no asterixis       Ayven Glasco, Hunt Oris, MD 06/06/2021, 10:44 AM

## 2021-06-06 NOTE — Progress Notes (Signed)
Mobility Specialist: Progress Note   06/06/21 1822  Mobility  Activity Stood at bedside  Level of Assistance Minimal assist, patient does 75% or more  Assistive Device Front wheel walker  Activity Response Tolerated poorly  $Mobility charge 1 Mobility   Pre-Mobility: 94 HR, 100% SpO2 Post-Mobility: 93 HR, 140/66 (88) BP, 100% SpO2  Pt received in the bed, reluctant but agreeable to mobility. Attempted x2 to stand, minA. Able to stand on second attempt from elevated bed height. Pt requesting to sit back down after a few seconds secondary to chest pain and nausea, RN notified. Pt assisted back to bed with call bell and phone at his side. Bed alarm is on.   [facility] Buzz Axel Mobility Specialist Mobility Specialist 5 [address]: [phone] Mobility Specialist 6 [address]: [phone]

## 2021-06-06 NOTE — Progress Notes (Signed)
PROGRESS NOTE    Ryan Jenkins  XVQ:[id number] DOB: Feb 03, 1960 DOA: 06/04/2021 PCP: Armanda Heritage, NP   Brief Narrative:  Ryan Jenkins is a 61 y.o. male with medical history significant of diabetes with neuropathy and gastroparesis, hypertension, CKD 4, depression, anxiety, hyperlipidemia, GERD, CHF, CAD, anemia presented with multiple complaints including shortness of breath and chest pain and was admitted with acute hypoxic respiratory failure and severe sepsis secondary to pneumonia.  Assessment & Plan:   Principal Problem:   Pneumonia Active Problems:   Type 2 diabetes mellitus with peripheral neuropathy (HCC)   Essential hypertension   Depression   Hyperlipidemia   Gastroesophageal reflux disease without esophagitis   CKD (chronic kidney disease), stage III (HCC)   Acute respiratory failure with hypoxia (HCC)   Acute renal failure superimposed on stage 4 chronic kidney disease (HCC)   Coronary artery disease   Severe sepsis (HCC)  Severe sepsis and acute hypoxic respiratory failure secondary to community-acquired pneumonia: Patient meets severe sepsis criteria based on temperature of 100.6, tachycardia, tachypnea and leukocytosis of 15 as well as acute hypoxia.  No lactic acid drawn yesterday.  Urine streptococcal antigen negative.  Legionella pending.  Sputum culture pending.  Leukocytosis improving, last low-grade temperature spike on 06/05/2021 at 1:28 PM.  Procalcitonin 1.43.  Blood culture negative.  We will continue Rocephin and Zithromax.  He is feeling slightly better than yesterday.  Mild metabolic acidosis and AKI on CKD stage IV: Patient's baseline creatinine is 2.7 with GFR of 26, presented with 3.43 which worsened slightly more today at 4.35.  This is likely secondary to acute infection and we had to give him 1 dose of Lasix yesterday as well.  Due to rising creatinine, I have consulted nephrology.  I will hold off to further Lasix and will defer to  nephrology.  Chest pain: Patient also complained of chest pain, mostly after coughing.  He also tells me that he had herpes zoster at the right anterior chest going all the way under the right axilla 3 months ago and he continues to have pain there despite of being on gabapentin.  Troponin only slightly elevated on almost flat.  Echo once again showed 60 to 65% ejection fraction with grade 2 diastolic dysfunction and no wall motion abnormality.  Acute on chronic chronic diastolic congestive heart failure: Patient has elevated BNP but initial chest x-ray negative for pulm edema or vascular congestion.  Yesterday around noon, patient had worsening shortness of breath and he had crackles on exam, chest x-ray was done and at that point in time this showed vascular congestion/pulmonary edema.  We gave him 1 dose of Lasix, he felt better.  Essential hypertension: Blood pressure controlled.  Continue home medications which includes amlodipine, finasteride, hydralazine and metoprolol.  Hyperlipidemia: Continue statin.  Diabetes mellitus type 2: Blood sugar controlled on SSI.  GERD: Continue PPI.  Neuropathy: Continue home nortriptyline and gabapentin.  Chronic mild depression: Continue Lexapro.  DVT prophylaxis: enoxaparin (LOVENOX) injection 30 mg Start: 06/04/21 1915   Code Status: Full Code  Family Communication:  None present at bedside.  Plan of care discussed with patient in length and he/she verbalized understanding and agreed with it.  Status is: Inpatient Remains inpatient appropriate because: Patient is still very sick.   Estimated body mass index is 28.96 kg/m as calculated from the following:   Height as of this encounter: '6\' 2"'$  (1.88 m).   Weight as of this encounter: 102.3 kg.    Nutritional Assessment:  Body mass index is 28.96 kg/m.[name] Seen by dietician.  I agree with the assessment and plan as outlined below: Nutrition Status:        . Skin Assessment: I have examined  the patient's skin and I agree with the wound assessment as performed by the wound care RN as outlined below:    Consultants:  None  Procedures:  None  Antimicrobials:  Anti-infectives (From admission, onward)    Start     Dose/Rate Route Frequency Ordered Stop   06/05/21 1800  cefTRIAXone (ROCEPHIN) 2 g in sodium chloride 0.9 % 100 mL IVPB        2 g 200 mL/hr over 30 Minutes Intravenous Every 24 hours 06/04/21 1910 06/09/21 1759   06/05/21 1800  azithromycin (ZITHROMAX) 500 mg in sodium chloride 0.9 % 250 mL IVPB        500 mg 250 mL/hr over 60 Minutes Intravenous Every 24 hours 06/04/21 1910 06/09/21 1759   06/04/21 1815  cefTRIAXone (ROCEPHIN) 2 g in sodium chloride 0.9 % 100 mL IVPB        2 g 200 mL/hr over 30 Minutes Intravenous  Once 06/04/21 1812 06/04/21 2200   06/04/21 1815  azithromycin (ZITHROMAX) 500 mg in sodium chloride 0.9 % 250 mL IVPB        500 mg 250 mL/hr over 60 Minutes Intravenous  Once 06/04/21 1812 06/04/21 2214         Subjective:  Patient seen and examined.  Breathing has improved.  No new complaint.  Objective: Vitals:   06/06/21 0348 06/06/21 0600 06/06/21 0646 06/06/21 0836  BP: 140/73   (!) 158/72  Pulse: 99   (!) 101  Resp: '18 20 18 17  '$ Temp: 98 F (36.7 C)   99.5 F (37.5 C)  TempSrc: Oral   Oral  SpO2: 100%   100%  Weight:   102.3 kg   Height:        Intake/Output Summary (Last 24 hours) at 06/06/2021 1011 Last data filed at 06/06/2021 0616 Gross per 24 hour  Intake 0 ml  Output 900 ml  Net -900 ml    Filed Weights   06/04/21 2100 06/05/21 0650 06/06/21 0646  Weight: 100.8 kg 100.7 kg 102.3 kg    Examination:  General exam: Appears calm and comfortable  Respiratory system: Very faint crackles with rhonchi bilaterally. Respiratory effort normal. Cardiovascular system: S1 & S2 heard, RRR. No JVD, murmurs, rubs, gallops or clicks. No pedal edema. Gastrointestinal system: Abdomen is nondistended, soft and nontender. No  organomegaly or masses felt. Normal bowel sounds heard. Central nervous system: Alert and oriented. No focal neurological deficits. Extremities: Symmetric 5 x 5 power. Skin: No rashes, lesions or ulcers.  Psychiatry: Judgement and insight appear normal. Mood & affect appropriate.    Data Reviewed: I have personally reviewed following labs and imaging studies  CBC: Recent Labs  Lab 06/04/21 1620 06/05/21 0414 06/06/21 0446  WBC 15.0* 13.0* 12.0*  NEUTROABS  --   --  10.2*  HGB 10.2* 9.0* 9.0*  HCT 30.4* 27.4* 26.5*  MCV 89.4 90.7 88.6  PLT 306 249 270    Basic Metabolic Panel: Recent Labs  Lab 06/04/21 1620 06/05/21 0414 06/06/21 0446  NA 139 140 130*  K 3.8 3.8 3.6  CL 109 113* 104  CO2 19* 20* 18*  GLUCOSE 169* 128* 146*  BUN 47* 52* 62*  CREATININE 3.43* 3.74* 4.35*  CALCIUM 8.5* 8.0* 7.9*  MG  --   --  1.7    GFR: Estimated Creatinine Clearance: 23 mL/min (A) (by C-G formula based on SCr of 4.35 mg/dL (H)). Liver Function Tests: Recent Labs  Lab 06/05/21 0414  AST 14*  ALT 13  ALKPHOS 61  BILITOT 0.7  PROT 6.1*  ALBUMIN 2.7*    No results for input(s): LIPASE, AMYLASE in the last 168 hours. No results for input(s): AMMONIA in the last 168 hours. Coagulation Profile: No results for input(s): INR, PROTIME in the last 168 hours. Cardiac Enzymes: No results for input(s): CKTOTAL, CKMB, CKMBINDEX, TROPONINI in the last 168 hours. BNP (last 3 results) No results for input(s): PROBNP in the last 8760 hours. HbA1C: Recent Labs    06/05/21 0414  HGBA1C 7.1*   CBG: Recent Labs  Lab 06/05/21 0603 06/05/21 1131 06/05/21 1629 06/05/21 2046 06/06/21 0625  GLUCAP 110* 165* 149* 169* 146*    Lipid Profile: No results for input(s): CHOL, HDL, LDLCALC, TRIG, CHOLHDL, LDLDIRECT in the last 72 hours. Thyroid Function Tests: No results for input(s): TSH, T4TOTAL, FREET4, T3FREE, THYROIDAB in the last 72 hours. Anemia Panel: No results for input(s):  VITAMINB12, FOLATE, FERRITIN, TIBC, IRON, RETICCTPCT in the last 72 hours. Sepsis Labs: Recent Labs  Lab 06/05/21 0414  PROCALCITON 1.43     Recent Results (from the past 240 hour(s))  Blood culture (routine x 2)     Status: None (Preliminary result)   Collection Time: 06/04/21  6:17 PM   Specimen: BLOOD LEFT ARM  Result Value Ref Range Status   Specimen Description BLOOD LEFT ARM  Final   Special Requests   Final    BOTTLES DRAWN AEROBIC AND ANAEROBIC Blood Culture results may not be optimal due to an inadequate volume of blood received in culture bottles   Culture   Final    NO GROWTH 2 DAYS Performed at [facility], [place] [address] [place], [place] [address]    Report Status PENDING  Incomplete  Resp Panel by RT-PCR (Flu A&B, Covid) Peripheral     Status: None   Collection Time: 06/04/21  7:08 PM   Specimen: Peripheral; Nasopharyngeal(NP) swabs in vial transport medium  Result Value Ref Range Status   SARS Coronavirus 2 by RT PCR NEGATIVE NEGATIVE Final    Comment: (NOTE) SARS-CoV-2 target nucleic acids are NOT DETECTED.  The SARS-CoV-2 RNA is generally detectable in upper respiratory specimens during the acute phase of infection. The lowest concentration of SARS-CoV-2 viral copies this assay can detect is 138 copies/mL. A negative result does not preclude SARS-Cov-2 infection and should not be used as the sole basis for treatment or other patient management decisions. A negative result may occur with  improper specimen collection/handling, submission of specimen other than nasopharyngeal swab, presence of viral mutation(s) within the areas targeted by this assay, and inadequate number of viral copies(<138 copies/mL). A negative result must be combined with clinical observations, patient history, and epidemiological information. The expected result is Negative.  Fact Sheet for Patients:  EntrepreneurPulse.com.au  Fact Sheet for Healthcare  Providers:  IncredibleEmployment.be  This test is no t yet approved or cleared by the [place] FDA and  has been authorized for detection and/or diagnosis of SARS-CoV-2 by FDA under an Emergency Use Authorization (EUA). This EUA will remain  in effect (meaning this test can be used) for the duration of the COVID-19 declaration under Section 564(b)(1) of the Act, 21 U.S.C.section 360bbb-3(b)(1), unless the authorization is terminated  or revoked sooner.  Influenza A by PCR NEGATIVE NEGATIVE Final   Influenza B by PCR NEGATIVE NEGATIVE Final    Comment: (NOTE) The Xpert Xpress SARS-CoV-2/FLU/RSV plus assay is intended as an aid in the diagnosis of influenza from Nasopharyngeal swab specimens and should not be used as a sole basis for treatment. Nasal washings and aspirates are unacceptable for Xpert Xpress SARS-CoV-2/FLU/RSV testing.  Fact Sheet for Patients: EntrepreneurPulse.com.au  Fact Sheet for Healthcare Providers: IncredibleEmployment.be  This test is not yet approved or cleared by the [place] FDA and has been authorized for detection and/or diagnosis of SARS-CoV-2 by FDA under an Emergency Use Authorization (EUA). This EUA will remain in effect (meaning this test can be used) for the duration of the COVID-19 declaration under Section 564(b)(1) of the Act, 21 U.S.C. section 360bbb-3(b)(1), unless the authorization is terminated or revoked.  Performed at [facility], [place] [address], [place], [place] [address]   Blood culture (routine x 2)     Status: None (Preliminary result)   Collection Time: 06/04/21  7:19 PM   Specimen: BLOOD RIGHT ARM  Result Value Ref Range Status   Specimen Description BLOOD RIGHT ARM  Final   Special Requests   Final    BOTTLES DRAWN AEROBIC AND ANAEROBIC Blood Culture adequate volume   Culture   Final    NO GROWTH 2 DAYS Performed at [facility], [place] [address], [place], [place] [address]    Report Status PENDING  Incomplete      Radiology Studies: DG Chest 2 View  Result Date: 06/04/2021 CLINICAL DATA:  Chest pain.  Shortness of breath. EXAM: CHEST - 2 VIEW COMPARISON:  February 07, 2021 FINDINGS: Mild patchy opacity in the right mid lower lung. The cardiomediastinal silhouette is stable. No pneumothorax. No nodules or masses. No left-sided infiltrates. IMPRESSION: Mild patchy opacities in the right lung are worrisome for developing pneumonia. Recommend short-term follow-up imaging to ensure resolution. Electronically Signed   By: Dorise Bullion III M.D.   On: 06/04/2021 17:09   DG CHEST PORT 1 VIEW  Result Date: 06/05/2021 CLINICAL DATA:  Shortness of breath. History of congestive heart failure, diabetes, hypertension EXAM: PORTABLE CHEST 1 VIEW COMPARISON:  Chest x-ray from yesterday FINDINGS: Mild cardiomegaly. There are confluent opacities seen at the perihilar regions greater on the right side predominantly in the right middle and lower lobes likely on the basis of worsening pulmonary edema. Superimposed pneumonia cannot be entirely excluded. No pleural effusion. The visualized skeletal structures are unremarkable. IMPRESSION: Perihilar batwing kind of opacities greater on the right likely on the basis of pulmonary edema and has worsened in the interim. Superimposed pneumonia cannot be excluded. Electronically Signed   By: Frazier Richards M.D.   On: 06/05/2021 13:01   ECHOCARDIOGRAM COMPLETE  Result Date: 06/05/2021    ECHOCARDIOGRAM REPORT   Patient Name:   Ryan Jenkins Date of Exam: 06/05/2021 Medical Rec #:  [id number]         Height:       74.0 in Accession #:    [id number]        Weight:       222.0 lb Date of Birth:  1960/11/17         BSA:          2.272 m Patient Age:    60 years          BP:           134/68 mmHg Patient Gender:  M                 HR:           103 bpm. Exam Location:  Inpatient Procedure: 2D Echo, Color Doppler and  Cardiac Doppler Indications:    CHF  History:        Patient has prior history of Echocardiogram examinations. CHF,                 CAD; Risk Factors:Hypertension and Diabetes.  Sonographer:    Jyl Heinz Referring Phys: [id number] Vessie Olmsted IMPRESSIONS  1. Left ventricular ejection fraction, by estimation, is 60 to 65%. The left ventricle has normal function. The left ventricle has no regional wall motion abnormalities. There is moderate concentric left ventricular hypertrophy. Left ventricular diastolic parameters are consistent with Grade II diastolic dysfunction (pseudonormalization).  2. Right ventricular systolic function is normal. The right ventricular size is normal.  3. The mitral valve is normal in structure. Trivial mitral valve regurgitation.  4. The aortic valve is tricuspid. There is mild calcification of the aortic valve. There is mild thickening of the aortic valve. Aortic valve regurgitation is not visualized. Aortic valve sclerosis/calcification is present, without any evidence of aortic stenosis.  5. The inferior vena cava is normal in size with greater than 50% respiratory variability, suggesting right atrial pressure of 3 mmHg. Comparison(s): Compared to prior TTE in 01/2021, there is no significant change. FINDINGS  Left Ventricle: Left ventricular ejection fraction, by estimation, is 60 to 65%. The left ventricle has normal function. The left ventricle has no regional wall motion abnormalities. The left ventricular internal cavity size was normal in size. There is  moderate concentric left ventricular hypertrophy. Left ventricular diastolic parameters are consistent with Grade II diastolic dysfunction (pseudonormalization). Right Ventricle: The right ventricular size is normal. No increase in right ventricular wall thickness. Right ventricular systolic function is normal. Left Atrium: Left atrial size was normal in size. Right Atrium: Right atrial size was normal in size. Pericardium:  Trivial pericardial effusion is present. Mitral Valve: The mitral valve is normal in structure. There is mild thickening of the mitral valve leaflet(s). Trivial mitral valve regurgitation. Tricuspid Valve: The tricuspid valve is normal in structure. Tricuspid valve regurgitation is trivial. Aortic Valve: The aortic valve is tricuspid. There is mild calcification of the aortic valve. There is mild thickening of the aortic valve. Aortic valve regurgitation is not visualized. Aortic valve sclerosis/calcification is present, without any evidence of aortic stenosis. Aortic valve peak gradient measures 6.9 mmHg. Pulmonic Valve: The pulmonic valve was normal in structure. Pulmonic valve regurgitation is trivial. Aorta: The aortic root and ascending aorta are structurally normal, with no evidence of dilitation. Venous: The inferior vena cava is normal in size with greater than 50% respiratory variability, suggesting right atrial pressure of 3 mmHg. IAS/Shunts: The atrial septum is grossly normal.  LEFT VENTRICLE PLAX 2D LVIDd:         5.00 cm      Diastology LVIDs:         3.40 cm      LV e' medial:    6.64 cm/s LV PW:         1.00 cm      LV E/e' medial:  18.5 LV IVS:        1.20 cm      LV e' lateral:   8.81 cm/s LVOT diam:     2.10 cm      LV E/e' lateral:  14.0 LV SV:         55 LV SV Index:   24 LVOT Area:     3.46 cm  LV Volumes (MOD) LV vol d, MOD A2C: 145.0 ml LV vol d, MOD A4C: 153.0 ml LV vol s, MOD A2C: 62.7 ml LV vol s, MOD A4C: 65.5 ml LV SV MOD A2C:     82.3 ml LV SV MOD A4C:     153.0 ml LV SV MOD BP:      85.5 ml RIGHT VENTRICLE            IVC RV Basal diam:  3.70 cm    IVC diam: 1.70 cm RV S prime:     9.95 cm/s TAPSE (M-mode): 1.8 cm LEFT ATRIUM           Index        RIGHT ATRIUM           Index LA diam:      4.50 cm 1.98 cm/m   RA Area:     15.50 cm LA Vol (A2C): 56.8 ml 25.00 ml/m  RA Volume:   39.20 ml  17.25 ml/m LA Vol (A4C): 71.2 ml 31.34 ml/m  AORTIC VALVE AV Area (Vmax): 2.48 cm AV Vmax:         131.00 cm/s AV Peak Grad:   6.9 mmHg LVOT Vmax:      93.80 cm/s LVOT Vmean:     71.300 cm/s LVOT VTI:       0.159 m  AORTA Ao Root diam: 3.40 cm Ao Asc diam:  3.30 cm MITRAL VALVE MV Area (PHT): 6.07 cm     SHUNTS MV Decel Time: 125 msec     Systemic VTI:  0.16 m MV E velocity: 123.00 cm/s  Systemic Diam: 2.10 cm MV A velocity: 66.10 cm/s MV E/A ratio:  1.86 Gwyndolyn Kaufman MD Electronically signed by Gwyndolyn Kaufman MD Signature Date/Time: 06/05/2021/2:51:43 PM    Final     Scheduled Meds:  amLODipine  10 mg Oral Daily   aspirin EC  81 mg Oral Daily   atorvastatin  40 mg Oral Daily   Chlorhexidine Gluconate Cloth  6 each Topical Q0600   enoxaparin (LOVENOX) injection  30 mg Subcutaneous Q24H   escitalopram  20 mg Oral Daily   finasteride  5 mg Oral Daily   gabapentin  200 mg Oral Daily   hydrALAZINE  100 mg Oral TID   insulin aspart  0-15 Units Subcutaneous TID WC   insulin aspart  0-5 Units Subcutaneous QHS   metoprolol succinate  50 mg Oral Daily   nortriptyline  25 mg Oral QHS   pantoprazole  40 mg Oral Daily   sodium chloride flush  3 mL Intravenous Q12H   Continuous Infusions:  azithromycin 500 mg (06/05/21 1852)   cefTRIAXone (ROCEPHIN)  IV 2 g (06/05/21 1853)     LOS: 1 day   Darliss Cheney, MD [facility]  06/06/2021, 10:11 AM   *Please note that this is a verbal dictation therefore any spelling or grammatical errors are due to the "[place] [facility]" system interpretation.  Please page via [place] and do not message via secure chat for urgent patient care matters. Secure chat can be used for non urgent patient care matters.  How to contact the [facility] Attending or Consulting provider [place] or covering provider during after hours [place], for this patient?  Check the care team in [facility] and look for a) attending/consulting  [place] provider listed and b) the [facility] team listed. Page or secure chat [phone]. Log into [email or web address] and use [place] universal password to  access. If you do not have the password, please contact the hospital operator. Locate the [facility] provider you are looking for under [facility] and page to a number that you can be directly reached. If you still have difficulty reaching the provider, please page the [facility] (Director on Call) for the Hospitalists listed on amion for assistance.

## 2021-06-07 DIAGNOSIS — N1832 Chronic kidney disease, stage 3b: Secondary | ICD-10-CM | POA: Diagnosis not present

## 2021-06-07 DIAGNOSIS — N179 Acute kidney failure, unspecified: Secondary | ICD-10-CM | POA: Diagnosis not present

## 2021-06-07 DIAGNOSIS — J189 Pneumonia, unspecified organism: Secondary | ICD-10-CM | POA: Diagnosis not present

## 2021-06-07 DIAGNOSIS — I2510 Atherosclerotic heart disease of native coronary artery without angina pectoris: Secondary | ICD-10-CM | POA: Diagnosis not present

## 2021-06-07 LAB — LEGIONELLA PNEUMOPHILA SEROGP 1 UR AG: L. pneumophila Serogp 1 Ur Ag: NEGATIVE

## 2021-06-07 LAB — BASIC METABOLIC PANEL
Anion gap: 13 (ref 5–15)
BUN: 75 mg/dL — ABNORMAL HIGH (ref 6–20)
CO2: 17 mmol/L — ABNORMAL LOW (ref 22–32)
Calcium: 8.4 mg/dL — ABNORMAL LOW (ref 8.9–10.3)
Chloride: 106 mmol/L (ref 98–111)
Creatinine, Ser: 4.87 mg/dL — ABNORMAL HIGH (ref 0.61–1.24)
GFR, Estimated: 13 mL/min — ABNORMAL LOW (ref >60)
Glucose, Bld: 110 mg/dL — ABNORMAL HIGH (ref 70–99)
Potassium: 4 mmol/L (ref 3.5–5.1)
Sodium: 136 mmol/L (ref 135–145)

## 2021-06-07 LAB — GLUCOSE, CAPILLARY
Glucose-Capillary: 107 mg/dL — ABNORMAL HIGH (ref 70–99)
Glucose-Capillary: 117 mg/dL — ABNORMAL HIGH (ref 70–99)
Glucose-Capillary: 132 mg/dL — ABNORMAL HIGH (ref 70–99)
Glucose-Capillary: 117 mg/dL — ABNORMAL HIGH (ref 70–99)

## 2021-06-07 MED ORDER — PHENOL 1.4 % MT LIQD
1.0000 | OROMUCOSAL | Status: DC | PRN
  Administered 2021-06-07: 1 via OROMUCOSAL
  Filled 2021-06-07: qty 177

## 2021-06-07 MED ORDER — NYSTATIN 100000 UNIT/ML MT SUSP
5.0000 mL | Freq: Four times a day (QID) | OROMUCOSAL | Status: DC
  Administered 2021-06-07 – 2021-06-13 (×23): 500000 [IU] via ORAL
  Filled 2021-06-07 (×21): qty 5

## 2021-06-07 MED ORDER — SODIUM CHLORIDE 0.9 % IV SOLN: INTRAVENOUS | Status: DC

## 2021-06-07 NOTE — Progress Notes (Signed)
[facility] Progress Note   61 y.o. male HTN, DM with evidence of end organ damage with neuropathy + gastroparesis + retinopathy, HLD, HFpEF, CASHD, BPH, CKD4 presenting with shortness of breath with chest pain, nausea and vomiting. He did receive nitroglycerin with EMS.  He is on Lasix '40mg'$  twice daily at home and also has a foley placed by urology.    In the ED he required 2L O2 supplementation, BUN Cr 45/3.43 with possible RLL pneumonia on CXR. Patient treated with ceftriaxone and azithromycin. Influenza and COVID were negative. BNP noted to be 1151. Patient received Lasix '40mg'$  x1 on 5/22.   Assessment/ Plan:   Acute on chronic renal failure CKD IIIb - certainly has had progression of kidney disease with creatinine in the mid to high 2's end of 2022 and then 01/2021 cr was in the 3.3-3.6 range -> but last labs at [facility] Cr was 2.7 with Dr. Carolin Sicks. Patient has also had significant proteinuria already in 2021. 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 Relatively normal renal ultrasound 02/05/2021. Patient only net neg 190 ml during this hospitalization. On urine microscopy there are florid WBC's in the setting of a foley that was just placed last week by urology with 1L urine in the bladder, some coarse granular casts but otherwise not an active sediment. - No absolute indication for RRT and the patient appears to be  comfortable.  - FeNA 0.22%, urinalysis shows a lot of RBC and WBC (has a foley in place), renal ultrasound normal size and no e/o obstruction with foley in place. Renal function worsening and had dosed Lasix '80mg'$  IV x1 on 5/23 as the FenA  of 0.22% can be consistent with prerenal azotemia or heart failure. At this point will hold further  Lasix, gentle hydration and monitor response. Patient also with poor appetite which may be secondary to the PNA, less likely to be from uremia.  - Monitor Daily I/Os, Daily weights  -Maintain MAP>65 for optimal  renal perfusion.  -Avoid nephrotoxic medications including NSAIDs, contrast   Hypertension - resumed home regimen. CAP - on abx HFpEF - decompensated, see above. DM - per primary CASHD BPH on flomax and proxcar  Subjective:   Feeling a little better, cough with yellowish sputum. Mild shortness of breath.   Objective:   BP 139/71 (BP Location: Left Arm)   Pulse 95   Temp 98.7 F (37.1 C) (Oral)   Resp 16   Ht '6\' 2"'$  (1.88 m)   Wt 100.7 kg   SpO2 100%   BMI 28.50 kg/m   Intake/Output Summary (Last 24 hours) at 06/07/2021 1107 Last data filed at 06/06/2021 1600 Gross per 24 hour  Intake 100 ml  Output --  Net 100 ml   Weight change: -1.6 kg  Physical Exam: Gen alert, no distress, nasal O2  No carotid bruits Chest clear bilat to bases RRR no RG Abd soft ntnd no mass or ascites +bs GU foley in place Ext no pretib or UE edema, trace-1+ hip edema Neuro is alert, Ox 3 , nf, no asterixis  Imaging: US RENAL  Result Date: 06/06/2021 CLINICAL DATA:  Inpatient. Renal failure. Stage 4 chronic kidney disease. EXAM: RENAL / URINARY TRACT ULTRASOUND COMPLETE COMPARISON:  02/05/2021 renal sonogram. FINDINGS: Right Kidney: Renal measurements: 11.2 x 6.1 x 6.4 cm = volume: 229 mL. Limited visualization of the lower pole due to overlying bowel gas. Echogenic right renal parenchyma, normal thickness. No renal masses. No hydronephrosis. Left Kidney: Renal  measurements: 11.6 x 7.3 x 6.6 = volume: 295 cc. Echogenic renal parenchyma, normal thickness. No renal masses. No hydronephrosis. Echogenicity within normal limits. No mass or hydronephrosis visualized. Bladder: Bladder not visualized, either due to empty bladder and/or overlying bowel gas. Other: Trace simple fluid in Morrison's pouch. IMPRESSION: 1. No hydronephrosis. 2. Echogenic normal size kidneys, compatible with reported clinical history of nonspecific acute on chronic renal parenchymal disease. 3. Nonvisualization of the urinary  bladder. 4. Trace simple fluid in Morrison's pouch. Electronically Signed   By: Ilona Sorrel M.D.   On: 06/06/2021 15:09   DG CHEST PORT 1 VIEW  Result Date: 06/05/2021 CLINICAL DATA:  Shortness of breath. History of congestive heart failure, diabetes, hypertension EXAM: PORTABLE CHEST 1 VIEW COMPARISON:  Chest x-ray from yesterday FINDINGS: Mild cardiomegaly. There are confluent opacities seen at the perihilar regions greater on the right side predominantly in the right middle and lower lobes likely on the basis of worsening pulmonary edema. Superimposed pneumonia cannot be entirely excluded. No pleural effusion. The visualized skeletal structures are unremarkable. IMPRESSION: Perihilar batwing kind of opacities greater on the right likely on the basis of pulmonary edema and has worsened in the interim. Superimposed pneumonia cannot be excluded. Electronically Signed   By: Frazier Richards M.D.   On: 06/05/2021 13:01   ECHOCARDIOGRAM COMPLETE  Result Date: 06/05/2021    ECHOCARDIOGRAM REPORT   Patient Name:   Ryan Jenkins Date of Exam: 06/05/2021 Medical Rec #:  [id number]         Height:       74.0 in Accession #:    [id number]        Weight:       222.0 lb Date of Birth:  07-13-1960         BSA:          2.272 m Patient Age:    50 years          BP:           134/68 mmHg Patient Gender: M                 HR:           103 bpm. Exam Location:  Inpatient Procedure: 2D Echo, Color Doppler and Cardiac Doppler Indications:    CHF  History:        Patient has prior history of Echocardiogram examinations. CHF,                 CAD; Risk Factors:Hypertension and Diabetes.  Sonographer:    Jyl Heinz Referring Phys: [id number] RAVI PAHWANI IMPRESSIONS  1. Left ventricular ejection fraction, by estimation, is 60 to 65%. The left ventricle has normal function. The left ventricle has no regional wall motion abnormalities. There is moderate concentric left ventricular hypertrophy. Left ventricular diastolic parameters  are consistent with Grade II diastolic dysfunction (pseudonormalization).  2. Right ventricular systolic function is normal. The right ventricular size is normal.  3. The mitral valve is normal in structure. Trivial mitral valve regurgitation.  4. The aortic valve is tricuspid. There is mild calcification of the aortic valve. There is mild thickening of the aortic valve. Aortic valve regurgitation is not visualized. Aortic valve sclerosis/calcification is present, without any evidence of aortic stenosis.  5. The inferior vena cava is normal in size with greater than 50% respiratory variability, suggesting right atrial pressure of 3 mmHg. Comparison(s): Compared to prior TTE in 01/2021, there is no significant change. FINDINGS  Left  Ventricle: Left ventricular ejection fraction, by estimation, is 60 to 65%. The left ventricle has normal function. The left ventricle has no regional wall motion abnormalities. The left ventricular internal cavity size was normal in size. There is  moderate concentric left ventricular hypertrophy. Left ventricular diastolic parameters are consistent with Grade II diastolic dysfunction (pseudonormalization). Right Ventricle: The right ventricular size is normal. No increase in right ventricular wall thickness. Right ventricular systolic function is normal. Left Atrium: Left atrial size was normal in size. Right Atrium: Right atrial size was normal in size. Pericardium: Trivial pericardial effusion is present. Mitral Valve: The mitral valve is normal in structure. There is mild thickening of the mitral valve leaflet(s). Trivial mitral valve regurgitation. Tricuspid Valve: The tricuspid valve is normal in structure. Tricuspid valve regurgitation is trivial. Aortic Valve: The aortic valve is tricuspid. There is mild calcification of the aortic valve. There is mild thickening of the aortic valve. Aortic valve regurgitation is not visualized. Aortic valve sclerosis/calcification is present,  without any evidence of aortic stenosis. Aortic valve peak gradient measures 6.9 mmHg. Pulmonic Valve: The pulmonic valve was normal in structure. Pulmonic valve regurgitation is trivial. Aorta: The aortic root and ascending aorta are structurally normal, with no evidence of dilitation. Venous: The inferior vena cava is normal in size with greater than 50% respiratory variability, suggesting right atrial pressure of 3 mmHg. IAS/Shunts: The atrial septum is grossly normal.  LEFT VENTRICLE PLAX 2D LVIDd:         5.00 cm      Diastology LVIDs:         3.40 cm      LV e' medial:    6.64 cm/s LV PW:         1.00 cm      LV E/e' medial:  18.5 LV IVS:        1.20 cm      LV e' lateral:   8.81 cm/s LVOT diam:     2.10 cm      LV E/e' lateral: 14.0 LV SV:         55 LV SV Index:   24 LVOT Area:     3.46 cm  LV Volumes (MOD) LV vol d, MOD A2C: 145.0 ml LV vol d, MOD A4C: 153.0 ml LV vol s, MOD A2C: 62.7 ml LV vol s, MOD A4C: 65.5 ml LV SV MOD A2C:     82.3 ml LV SV MOD A4C:     153.0 ml LV SV MOD BP:      85.5 ml RIGHT VENTRICLE            IVC RV Basal diam:  3.70 cm    IVC diam: 1.70 cm RV S prime:     9.95 cm/s TAPSE (M-mode): 1.8 cm LEFT ATRIUM           Index        RIGHT ATRIUM           Index LA diam:      4.50 cm 1.98 cm/m   RA Area:     15.50 cm LA Vol (A2C): 56.8 ml 25.00 ml/m  RA Volume:   39.20 ml  17.25 ml/m LA Vol (A4C): 71.2 ml 31.34 ml/m  AORTIC VALVE AV Area (Vmax): 2.48 cm AV Vmax:        131.00 cm/s AV Peak Grad:   6.9 mmHg LVOT Vmax:      93.80 cm/s LVOT Vmean:     71.300 cm/s  LVOT VTI:       0.159 m  AORTA Ao Root diam: 3.40 cm Ao Asc diam:  3.30 cm MITRAL VALVE MV Area (PHT): 6.07 cm     SHUNTS MV Decel Time: 125 msec     Systemic VTI:  0.16 m MV E velocity: 123.00 cm/s  Systemic Diam: 2.10 cm MV A velocity: 66.10 cm/s MV E/A ratio:  1.86 Gwyndolyn Kaufman MD Electronically signed by Gwyndolyn Kaufman MD Signature Date/Time: 06/05/2021/2:51:43 PM    Final     Labs: BMET Recent Labs  Lab  06/04/21 1620 06/05/21 0414 06/06/21 0446 06/07/21 0635  NA 139 140 130* 136  K 3.8 3.8 3.6 4.0  CL 109 113* 104 106  CO2 19* 20* 18* 17*  GLUCOSE 169* 128* 146* 110*  BUN 47* 52* 62* 75*  CREATININE 3.43* 3.74* 4.35* 4.87*  CALCIUM 8.5* 8.0* 7.9* 8.4*   CBC Recent Labs  Lab 06/04/21 1620 06/05/21 0414 06/06/21 0446  WBC 15.0* 13.0* 12.0*  NEUTROABS  --   --  10.2*  HGB 10.2* 9.0* 9.0*  HCT 30.4* 27.4* 26.5*  MCV 89.4 90.7 88.6  PLT 306 249 246    Medications:     amLODipine  10 mg Oral Daily   aspirin EC  81 mg Oral Daily   atorvastatin  40 mg Oral Daily   azithromycin  500 mg Oral Daily   Chlorhexidine Gluconate Cloth  6 each Topical Q0600   enoxaparin (LOVENOX) injection  30 mg Subcutaneous Q24H   escitalopram  20 mg Oral Daily   finasteride  5 mg Oral Daily   gabapentin  200 mg Oral Daily   hydrALAZINE  100 mg Oral TID   insulin aspart  0-15 Units Subcutaneous TID WC   insulin aspart  0-5 Units Subcutaneous QHS   metoprolol succinate  50 mg Oral Daily   nortriptyline  25 mg Oral QHS   pantoprazole  40 mg Oral Daily   sodium chloride flush  3 mL Intravenous Q12H      Otelia Santee, MD 06/07/2021, 11:07 AM

## 2021-06-07 NOTE — Progress Notes (Signed)
PROGRESS NOTE    Ryan Jenkins  YSA:[id number] DOB: [birth year] DOA: 06/04/2021 PCP: Armanda Heritage, NP   Brief Narrative:  Ryan Jenkins is a 61 y.o. male with medical history significant of diabetes with neuropathy and gastroparesis, hypertension, CKD 4, depression, anxiety, hyperlipidemia, GERD, CHF, CAD, anemia presented with multiple complaints including shortness of breath and chest pain and was admitted with acute hypoxic respiratory failure and severe sepsis secondary to pneumonia.  Assessment & Plan:   Principal Problem:   Pneumonia Active Problems:   Type 2 diabetes mellitus with peripheral neuropathy (HCC)   Essential hypertension   Depression   Hyperlipidemia   Gastroesophageal reflux disease without esophagitis   CKD (chronic kidney disease), stage III (HCC)   Acute respiratory failure with hypoxia (HCC)   Acute renal failure superimposed on stage 4 chronic kidney disease (HCC)   Coronary artery disease   Severe sepsis (HCC)  Severe sepsis and acute hypoxic respiratory failure secondary to community-acquired pneumonia: Patient meets severe sepsis criteria based on temperature of 100.6, tachycardia, tachypnea and leukocytosis of 15 as well as acute hypoxia.  No lactic acid drawn yesterday.  Urine streptococcal antigen negative.  Legionella pending.  Sputum culture pending.  Leukocytosis improving, last low-grade temperature spike on 06/05/2021 at 1:28 PM.  Procalcitonin 1.43.  Blood culture negative.  Leukocytosis improving.  He is afebrile now.  We will continue Rocephin and Zithromax.  Breathing is improving.  He is down to 2 L of oxygen now.  Continue to wean.  Mild metabolic acidosis and AKI on CKD stage IV: Patient's baseline creatinine is 2.7 with GFR of 26, presented with 3.43 which worsened slightly more today at 4.87.  Per chart, it appears that he has had only 190 cc output since admission, I doubt the accuracy.  I have placed order for strict I's and O's and  have discussed with the nurse as well.  Nephrology on board and managing.  Chest pain: Patient also complained of chest pain, mostly after coughing.  He also tells me that he had herpes zoster at the right anterior chest going all the way under the right axilla 3 months ago and he continues to have pain there despite of being on gabapentin.  Troponin only slightly elevated on almost flat.  Echo once again showed 60 to 65% ejection fraction with grade 2 diastolic dysfunction and no wall motion abnormality.  Acute on chronic chronic diastolic congestive heart failure: Patient had elevated BNP but initial chest x-ray negative for pulm edema or vascular congestion.  On 06/05/2021 around noon, patient had worsening shortness of breath and he had crackles on exam, chest x-ray was done and at that point in time this showed vascular congestion/pulmonary edema.  We gave him 1 dose of Lasix, he felt better.  He was given another dose of Lasix by nephrology on 06/06/2021.  Continues to improve but renal function is worsening now.  Will defer to nephrology for any further diuretics.  Essential hypertension: Blood pressure controlled.  Continue home medications which includes amlodipine, finasteride, hydralazine and metoprolol.  Oral candidiasis: Start nystatin swish and swallow.  Hyperlipidemia: Continue statin.  Diabetes mellitus type 2: Blood sugar controlled on SSI.  GERD: Continue PPI.  Neuropathy: Continue home nortriptyline and gabapentin.  Chronic mild depression: Continue Lexapro.  DVT prophylaxis: enoxaparin (LOVENOX) injection 30 mg Start: 06/04/21 1915   Code Status: Full Code  Family Communication:  None present at bedside.  Plan of care discussed with patient in length and he/she  verbalized understanding and agreed with it.  Status is: Inpatient Remains inpatient appropriate because: Patient is still very sick with rising creatinine.   Estimated body mass index is 28.5 kg/m as calculated  from the following:   Height as of this encounter: '6\' 2"'$  (1.88 m).   Weight as of this encounter: 100.7 kg.    Nutritional Assessment: Body mass index is 28.5 kg/m.[name] Seen by dietician.  I agree with the assessment and plan as outlined below: Nutrition Status:        . Skin Assessment: I have examined the patient's skin and I agree with the wound assessment as performed by the wound care RN as outlined below:    Consultants:  Nephrology  Procedures:  None  Antimicrobials:  Anti-infectives (From admission, onward)    Start     Dose/Rate Route Frequency Ordered Stop   06/06/21 1800  azithromycin (ZITHROMAX) tablet 500 mg        500 mg Oral Daily 06/06/21 1114 06/09/21 1759   06/05/21 1800  cefTRIAXone (ROCEPHIN) 2 g in sodium chloride 0.9 % 100 mL IVPB        2 g 200 mL/hr over 30 Minutes Intravenous Every 24 hours 06/04/21 1910 06/09/21 1759   06/05/21 1800  azithromycin (ZITHROMAX) 500 mg in sodium chloride 0.9 % 250 mL IVPB  Status:  Discontinued        500 mg 250 mL/hr over 60 Minutes Intravenous Every 24 hours 06/04/21 1910 06/06/21 1114   06/04/21 1815  cefTRIAXone (ROCEPHIN) 2 g in sodium chloride 0.9 % 100 mL IVPB        2 g 200 mL/hr over 30 Minutes Intravenous  Once 06/04/21 1812 06/04/21 2200   06/04/21 1815  azithromycin (ZITHROMAX) 500 mg in sodium chloride 0.9 % 250 mL IVPB        500 mg 250 mL/hr over 60 Minutes Intravenous  Once 06/04/21 1812 06/04/21 2214         Subjective: Complaining of generalized weakness and throat pain.  On exam, he appears to have oral thrush.  Objective: Vitals:   06/07/21 0408 06/07/21 0438 06/07/21 0915 06/07/21 1100  BP: (!) 144/70  (!) 152/71 139/71  Pulse: 92  98 95  Resp: '11 17 15 16  '$ Temp: 98 F (36.7 C)  100 F (37.8 C) 98.7 F (37.1 C)  TempSrc: Oral  Oral Oral  SpO2: 100%  100% 100%  Weight:  100.7 kg    Height:        Intake/Output Summary (Last 24 hours) at 06/07/2021 1128 Last data filed at  06/06/2021 1600 Gross per 24 hour  Intake 100 ml  Output --  Net 100 ml    Filed Weights   06/05/21 0650 06/06/21 0646 06/07/21 0438  Weight: 100.7 kg 102.3 kg 100.7 kg    Examination:  General exam: Appears calm and comfortable but physically weak Respiratory system: Clear to auscultation. Respiratory effort normal. Cardiovascular system: S1 & S2 heard, RRR. No JVD, murmurs, rubs, gallops or clicks. No pedal edema. Gastrointestinal system: Abdomen is nondistended, soft and nontender. No organomegaly or masses felt. Normal bowel sounds heard. Central nervous system: Alert and oriented. No focal neurological deficits. Extremities: Symmetric 5 x 5 power. Skin: No rashes, lesions or ulcers.  Psychiatry: Judgement and insight appear normal. Mood & affect appropriate.   Data Reviewed: I have personally reviewed following labs and imaging studies  CBC: Recent Labs  Lab 06/04/21 1620 06/05/21 0414 06/06/21 0446  WBC 15.0* 13.0*  12.0*  NEUTROABS  --   --  10.2*  HGB 10.2* 9.0* 9.0*  HCT 30.4* 27.4* 26.5*  MCV 89.4 90.7 88.6  PLT 306 249 983    Basic Metabolic Panel: Recent Labs  Lab 06/04/21 1620 06/05/21 0414 06/06/21 0446 06/07/21 0635  NA 139 140 130* 136  K 3.8 3.8 3.6 4.0  CL 109 113* 104 106  CO2 19* 20* 18* 17*  GLUCOSE 169* 128* 146* 110*  BUN 47* 52* 62* 75*  CREATININE 3.43* 3.74* 4.35* 4.87*  CALCIUM 8.5* 8.0* 7.9* 8.4*  MG  --   --  1.7  --     GFR: Estimated Creatinine Clearance: 20.4 mL/min (A) (by C-G formula based on SCr of 4.87 mg/dL (H)). Liver Function Tests: Recent Labs  Lab 06/05/21 0414  AST 14*  ALT 13  ALKPHOS 61  BILITOT 0.7  PROT 6.1*  ALBUMIN 2.7*    No results for input(s): LIPASE, AMYLASE in the last 168 hours. No results for input(s): AMMONIA in the last 168 hours. Coagulation Profile: No results for input(s): INR, PROTIME in the last 168 hours. Cardiac Enzymes: No results for input(s): CKTOTAL, CKMB, CKMBINDEX,  TROPONINI in the last 168 hours. BNP (last 3 results) No results for input(s): PROBNP in the last 8760 hours. HbA1C: Recent Labs    06/05/21 0414  HGBA1C 7.1*    CBG: Recent Labs  Lab 06/06/21 1714 06/06/21 1759 06/06/21 2154 06/07/21 0618 06/07/21 1103  GLUCAP 74 142* 139* 107* 117*    Lipid Profile: No results for input(s): CHOL, HDL, LDLCALC, TRIG, CHOLHDL, LDLDIRECT in the last 72 hours. Thyroid Function Tests: No results for input(s): TSH, T4TOTAL, FREET4, T3FREE, THYROIDAB in the last 72 hours. Anemia Panel: No results for input(s): VITAMINB12, FOLATE, FERRITIN, TIBC, IRON, RETICCTPCT in the last 72 hours. Sepsis Labs: Recent Labs  Lab 06/05/21 0414  PROCALCITON 1.43     Recent Results (from the past 240 hour(s))  Blood culture (routine x 2)     Status: None (Preliminary result)   Collection Time: 06/04/21  6:17 PM   Specimen: BLOOD LEFT ARM  Result Value Ref Range Status   Specimen Description BLOOD LEFT ARM  Final   Special Requests   Final    BOTTLES DRAWN AEROBIC AND ANAEROBIC Blood Culture results may not be optimal due to an inadequate volume of blood received in culture bottles   Culture   Final    NO GROWTH 3 DAYS Performed at [facility], [place] [address], [place], [place] [address]    Report Status PENDING  Incomplete  Resp Panel by RT-PCR (Flu A&B, Covid) Peripheral     Status: None   Collection Time: 06/04/21  7:08 PM   Specimen: Peripheral; Nasopharyngeal(NP) swabs in vial transport medium  Result Value Ref Range Status   SARS Coronavirus 2 by RT PCR NEGATIVE NEGATIVE Final    Comment: (NOTE) SARS-CoV-2 target nucleic acids are NOT DETECTED.  The SARS-CoV-2 RNA is generally detectable in upper respiratory specimens during the acute phase of infection. The lowest concentration of SARS-CoV-2 viral copies this assay can detect is 138 copies/mL. A negative result does not preclude SARS-Cov-2 infection and should not be used as the sole  basis for treatment or other patient management decisions. A negative result may occur with  improper specimen collection/handling, submission of specimen other than nasopharyngeal swab, presence of viral mutation(s) within the areas targeted by this assay, and inadequate number of viral copies(<138 copies/mL). A negative result  must be combined with clinical observations, patient history, and epidemiological information. The expected result is Negative.  Fact Sheet for Patients:  EntrepreneurPulse.com.au  Fact Sheet for Healthcare Providers:  IncredibleEmployment.be  This test is no t yet approved or cleared by the [place] FDA and  has been authorized for detection and/or diagnosis of SARS-CoV-2 by FDA under an Emergency Use Authorization (EUA). This EUA will remain  in effect (meaning this test can be used) for the duration of the COVID-19 declaration under Section 564(b)(1) of the Act, 21 U.S.C.section 360bbb-3(b)(1), unless the authorization is terminated  or revoked sooner.       Influenza A by PCR NEGATIVE NEGATIVE Final   Influenza B by PCR NEGATIVE NEGATIVE Final    Comment: (NOTE) The Xpert Xpress SARS-CoV-2/FLU/RSV plus assay is intended as an aid in the diagnosis of influenza from Nasopharyngeal swab specimens and should not be used as a sole basis for treatment. Nasal washings and aspirates are unacceptable for Xpert Xpress SARS-CoV-2/FLU/RSV testing.  Fact Sheet for Patients: EntrepreneurPulse.com.au  Fact Sheet for Healthcare Providers: IncredibleEmployment.be  This test is not yet approved or cleared by the [place] FDA and has been authorized for detection and/or diagnosis of SARS-CoV-2 by FDA under an Emergency Use Authorization (EUA). This EUA will remain in effect (meaning this test can be used) for the duration of the COVID-19 declaration under Section 564(b)(1) of the Act,  21 U.S.C. section 360bbb-3(b)(1), unless the authorization is terminated or revoked.  Performed at [facility], [place] [address] [place], [place] [address]   Blood culture (routine x 2)     Status: None (Preliminary result)   Collection Time: 06/04/21  7:19 PM   Specimen: BLOOD RIGHT ARM  Result Value Ref Range Status   Specimen Description BLOOD RIGHT ARM  Final   Special Requests   Final    BOTTLES DRAWN AEROBIC AND ANAEROBIC Blood Culture adequate volume   Culture   Final    NO GROWTH 3 DAYS Performed at [facility], [place] [address] [place], [place] [address]    Report Status PENDING  Incomplete      Radiology Studies: US RENAL  Result Date: 06/06/2021 CLINICAL DATA:  Inpatient. Renal failure. Stage 4 chronic kidney disease. EXAM: RENAL / URINARY TRACT ULTRASOUND COMPLETE COMPARISON:  02/05/2021 renal sonogram. FINDINGS: Right Kidney: Renal measurements: 11.2 x 6.1 x 6.4 cm = volume: 229 mL. Limited visualization of the lower pole due to overlying bowel gas. Echogenic right renal parenchyma, normal thickness. No renal masses. No hydronephrosis. Left Kidney: Renal measurements: 11.6 x 7.3 x 6.6 = volume: 295 cc. Echogenic renal parenchyma, normal thickness. No renal masses. No hydronephrosis. Echogenicity within normal limits. No mass or hydronephrosis visualized. Bladder: Bladder not visualized, either due to empty bladder and/or overlying bowel gas. Other: Trace simple fluid in Morrison's pouch. IMPRESSION: 1. No hydronephrosis. 2. Echogenic normal size kidneys, compatible with reported clinical history of nonspecific acute on chronic renal parenchymal disease. 3. Nonvisualization of the urinary bladder. 4. Trace simple fluid in Morrison's pouch. Electronically Signed   By: Ilona Sorrel M.D.   On: 06/06/2021 15:09   DG CHEST PORT 1 VIEW  Result Date: 06/05/2021 CLINICAL DATA:  Shortness of breath. History of congestive heart failure, diabetes, hypertension EXAM: PORTABLE  CHEST 1 VIEW COMPARISON:  Chest x-ray from yesterday FINDINGS: Mild cardiomegaly. There are confluent opacities seen at the perihilar regions greater on the right side predominantly in the right middle and lower lobes  likely on the basis of worsening pulmonary edema. Superimposed pneumonia cannot be entirely excluded. No pleural effusion. The visualized skeletal structures are unremarkable. IMPRESSION: Perihilar batwing kind of opacities greater on the right likely on the basis of pulmonary edema and has worsened in the interim. Superimposed pneumonia cannot be excluded. Electronically Signed   By: Frazier Richards M.D.   On: 06/05/2021 13:01   ECHOCARDIOGRAM COMPLETE  Result Date: 06/05/2021    ECHOCARDIOGRAM REPORT   Patient Name:   CHASKE PASKETT Date of Exam: 06/05/2021 Medical Rec #:  [id number]         Height:       74.0 in Accession #:    [id number]        Weight:       222.0 lb Date of Birth:  [birth year]         BSA:          2.272 m Patient Age:    32 years          BP:           134/68 mmHg Patient Gender: M                 HR:           103 bpm. Exam Location:  Inpatient Procedure: 2D Echo, Color Doppler and Cardiac Doppler Indications:    CHF  History:        Patient has prior history of Echocardiogram examinations. CHF,                 CAD; Risk Factors:Hypertension and Diabetes.  Sonographer:    Jyl Heinz Referring Phys: [id number] Justin Buechner IMPRESSIONS  1. Left ventricular ejection fraction, by estimation, is 60 to 65%. The left ventricle has normal function. The left ventricle has no regional wall motion abnormalities. There is moderate concentric left ventricular hypertrophy. Left ventricular diastolic parameters are consistent with Grade II diastolic dysfunction (pseudonormalization).  2. Right ventricular systolic function is normal. The right ventricular size is normal.  3. The mitral valve is normal in structure. Trivial mitral valve regurgitation.  4. The aortic valve is tricuspid. There  is mild calcification of the aortic valve. There is mild thickening of the aortic valve. Aortic valve regurgitation is not visualized. Aortic valve sclerosis/calcification is present, without any evidence of aortic stenosis.  5. The inferior vena cava is normal in size with greater than 50% respiratory variability, suggesting right atrial pressure of 3 mmHg. Comparison(s): Compared to prior TTE in 01/2021, there is no significant change. FINDINGS  Left Ventricle: Left ventricular ejection fraction, by estimation, is 60 to 65%. The left ventricle has normal function. The left ventricle has no regional wall motion abnormalities. The left ventricular internal cavity size was normal in size. There is  moderate concentric left ventricular hypertrophy. Left ventricular diastolic parameters are consistent with Grade II diastolic dysfunction (pseudonormalization). Right Ventricle: The right ventricular size is normal. No increase in right ventricular wall thickness. Right ventricular systolic function is normal. Left Atrium: Left atrial size was normal in size. Right Atrium: Right atrial size was normal in size. Pericardium: Trivial pericardial effusion is present. Mitral Valve: The mitral valve is normal in structure. There is mild thickening of the mitral valve leaflet(s). Trivial mitral valve regurgitation. Tricuspid Valve: The tricuspid valve is normal in structure. Tricuspid valve regurgitation is trivial. Aortic Valve: The aortic valve is tricuspid. There is mild calcification of the aortic valve. There is mild thickening of the aortic  valve. Aortic valve regurgitation is not visualized. Aortic valve sclerosis/calcification is present, without any evidence of aortic stenosis. Aortic valve peak gradient measures 6.9 mmHg. Pulmonic Valve: The pulmonic valve was normal in structure. Pulmonic valve regurgitation is trivial. Aorta: The aortic root and ascending aorta are structurally normal, with no evidence of dilitation.  Venous: The inferior vena cava is normal in size with greater than 50% respiratory variability, suggesting right atrial pressure of 3 mmHg. IAS/Shunts: The atrial septum is grossly normal.  LEFT VENTRICLE PLAX 2D LVIDd:         5.00 cm      Diastology LVIDs:         3.40 cm      LV e' medial:    6.64 cm/s LV PW:         1.00 cm      LV E/e' medial:  18.5 LV IVS:        1.20 cm      LV e' lateral:   8.81 cm/s LVOT diam:     2.10 cm      LV E/e' lateral: 14.0 LV SV:         55 LV SV Index:   24 LVOT Area:     3.46 cm  LV Volumes (MOD) LV vol d, MOD A2C: 145.0 ml LV vol d, MOD A4C: 153.0 ml LV vol s, MOD A2C: 62.7 ml LV vol s, MOD A4C: 65.5 ml LV SV MOD A2C:     82.3 ml LV SV MOD A4C:     153.0 ml LV SV MOD BP:      85.5 ml RIGHT VENTRICLE            IVC RV Basal diam:  3.70 cm    IVC diam: 1.70 cm RV S prime:     9.95 cm/s TAPSE (M-mode): 1.8 cm LEFT ATRIUM           Index        RIGHT ATRIUM           Index LA diam:      4.50 cm 1.98 cm/m   RA Area:     15.50 cm LA Vol (A2C): 56.8 ml 25.00 ml/m  RA Volume:   39.20 ml  17.25 ml/m LA Vol (A4C): 71.2 ml 31.34 ml/m  AORTIC VALVE AV Area (Vmax): 2.48 cm AV Vmax:        131.00 cm/s AV Peak Grad:   6.9 mmHg LVOT Vmax:      93.80 cm/s LVOT Vmean:     71.300 cm/s LVOT VTI:       0.159 m  AORTA Ao Root diam: 3.40 cm Ao Asc diam:  3.30 cm MITRAL VALVE MV Area (PHT): 6.07 cm     SHUNTS MV Decel Time: 125 msec     Systemic VTI:  0.16 m MV E velocity: 123.00 cm/s  Systemic Diam: 2.10 cm MV A velocity: 66.10 cm/s MV E/A ratio:  1.86 Gwyndolyn Kaufman MD Electronically signed by Gwyndolyn Kaufman MD Signature Date/Time: 06/05/2021/2:51:43 PM    Final     Scheduled Meds:  amLODipine  10 mg Oral Daily   aspirin EC  81 mg Oral Daily   atorvastatin  40 mg Oral Daily   azithromycin  500 mg Oral Daily   Chlorhexidine Gluconate Cloth  6 each Topical Q0600   enoxaparin (LOVENOX) injection  30 mg Subcutaneous Q24H   escitalopram  20 mg Oral Daily   finasteride  5 mg Oral  Daily   gabapentin  200 mg Oral Daily   hydrALAZINE  100 mg Oral TID   insulin aspart  0-15 Units Subcutaneous TID WC   insulin aspart  0-5 Units Subcutaneous QHS   metoprolol succinate  50 mg Oral Daily   nortriptyline  25 mg Oral QHS   pantoprazole  40 mg Oral Daily   sodium chloride flush  3 mL Intravenous Q12H   Continuous Infusions:  cefTRIAXone (ROCEPHIN)  IV 2 g (06/06/21 1711)     LOS: 2 days   Darliss Cheney, MD [facility]  06/07/2021, 11:28 AM   *Please note that this is a verbal dictation therefore any spelling or grammatical errors are due to the "[employer]" system interpretation.  Please page via [place] and do not message via secure chat for urgent patient care matters. Secure chat can be used for non urgent patient care matters.  How to contact the [facility] Attending or Consulting provider [place] or covering provider during after hours [place], for this patient?  Check the care team in [facility] and look for a) attending/consulting TRH provider listed and b) the [facility] team listed. Page or secure chat [phone]. Log into [email or web address] and use [place]'s universal password to access. If you do not have the password, please contact the hospital operator. Locate the [facility] provider you are looking for under [facility] and page to a number that you can be directly reached. If you still have difficulty reaching the provider, please page the [facility] (Director on Call) for the Hospitalists listed on amion for assistance.

## 2021-06-07 NOTE — Progress Notes (Signed)
Mobility Specialist: Progress Note   06/07/21 1515  Mobility  Activity Ambulated with assistance in hallway  Level of Assistance Contact guard assist, steadying assist  Assistive Device Front wheel walker  Distance Ambulated (ft) 100 ft  Activity Response Tolerated fair  $Mobility charge 1 Mobility   Pre-Mobility on 1 L/min [place]: 85 HR, 100% SpO2 Post-Mobility on RA: 90 HR, 136/68 (88) BP, 100% SpO2  Pt received in the bed and agreeable to ambulation. Began ambulation on 1 L/min [place] but was able to wean pt to RA. Mild posterior lean upon standing requiring contact guard to correct. C/o BLE pain as well as UE fatigue. Pt sitting EOB after session with NT present in the room.   [facility] Jaylan Hinojosa Mobility Specialist Mobility Specialist 5 North: [phone] Mobility Specialist 6 North: [phone]

## 2021-06-08 DIAGNOSIS — J189 Pneumonia, unspecified organism: Secondary | ICD-10-CM | POA: Diagnosis not present

## 2021-06-08 LAB — GLUCOSE, CAPILLARY
Glucose-Capillary: 120 mg/dL — ABNORMAL HIGH (ref 70–99)
Glucose-Capillary: 135 mg/dL — ABNORMAL HIGH (ref 70–99)
Glucose-Capillary: 149 mg/dL — ABNORMAL HIGH (ref 70–99)
Glucose-Capillary: 128 mg/dL — ABNORMAL HIGH (ref 70–99)

## 2021-06-08 LAB — CBC WITH DIFFERENTIAL/PLATELET
Abs Immature Granulocytes: 0.04 10*3/uL (ref 0.00–0.07)
Basophils Absolute: 0 10*3/uL (ref 0.0–0.1)
Basophils Relative: 0 %
Eosinophils Absolute: 0.2 10*3/uL (ref 0.0–0.5)
Eosinophils Relative: 2 %
HCT: 26.2 % — ABNORMAL LOW (ref 39.0–52.0)
Hemoglobin: 8.4 g/dL — ABNORMAL LOW (ref 13.0–17.0)
Immature Granulocytes: 1 %
Lymphocytes Relative: 9 %
Lymphs Abs: 0.7 10*3/uL (ref 0.7–4.0)
MCH: 29.3 pg (ref 26.0–34.0)
MCHC: 32.1 g/dL (ref 30.0–36.0)
MCV: 91.3 fL (ref 80.0–100.0)
Monocytes Absolute: 0.7 10*3/uL (ref 0.1–1.0)
Monocytes Relative: 9 %
Neutro Abs: 6.2 10*3/uL (ref 1.7–7.7)
Neutrophils Relative %: 79 %
Platelets: 270 10*3/uL (ref 150–400)
RBC: 2.87 MIL/uL — ABNORMAL LOW (ref 4.22–5.81)
RDW: 14.2 % (ref 11.5–15.5)
WBC: 7.8 10*3/uL (ref 4.0–10.5)
nRBC: 0 % (ref 0.0–0.2)

## 2021-06-08 LAB — BASIC METABOLIC PANEL
Anion gap: 10 (ref 5–15)
BUN: 82 mg/dL — ABNORMAL HIGH (ref 6–20)
CO2: 15 mmol/L — ABNORMAL LOW (ref 22–32)
Calcium: 8.1 mg/dL — ABNORMAL LOW (ref 8.9–10.3)
Chloride: 107 mmol/L (ref 98–111)
Creatinine, Ser: 5.07 mg/dL — ABNORMAL HIGH (ref 0.61–1.24)
GFR, Estimated: 12 mL/min — ABNORMAL LOW (ref >60)
Glucose, Bld: 142 mg/dL — ABNORMAL HIGH (ref 70–99)
Potassium: 3.9 mmol/L (ref 3.5–5.1)
Sodium: 132 mmol/L — ABNORMAL LOW (ref 135–145)

## 2021-06-08 LAB — BRAIN NATRIURETIC PEPTIDE: B Natriuretic Peptide: 1013.6 pg/mL — ABNORMAL HIGH (ref 0.0–100.0)

## 2021-06-08 LAB — PROCALCITONIN: Procalcitonin: 1.46 ng/mL

## 2021-06-08 LAB — MAGNESIUM: Magnesium: 2 mg/dL (ref 1.7–2.4)

## 2021-06-08 MED ORDER — FUROSEMIDE 10 MG/ML IJ SOLN
120.0000 mg | Freq: Two times a day (BID) | INTRAVENOUS | Status: DC
  Administered 2021-06-08: 120 mg via INTRAVENOUS
  Filled 2021-06-08 (×2): qty 12
  Filled 2021-06-08: qty 2

## 2021-06-08 MED ORDER — SODIUM BICARBONATE 650 MG PO TABS
650.0000 mg | ORAL_TABLET | Freq: Two times a day (BID) | ORAL | Status: DC
Start: 2021-06-08 — End: 2021-06-13
  Administered 2021-06-08 – 2021-06-13 (×11): 650 mg via ORAL
  Filled 2021-06-08 (×11): qty 1

## 2021-06-08 NOTE — Progress Notes (Signed)
PROGRESS NOTE    [name]  WIO:[id number] DOB: [birth year] DOA: 06/04/2021 PCP: Armanda Heritage, NP   Brief Narrative:  Ryan Jenkins is a 61 y.o. male with medical history significant of diabetes with neuropathy and gastroparesis, hypertension, CKD 4, depression, anxiety, hyperlipidemia, GERD, CHF, CAD, anemia presented with multiple complaints including shortness of breath and chest pain and was admitted with acute hypoxic respiratory failure and severe sepsis secondary to pneumonia.  Assessment & Plan:   Principal Problem:   Pneumonia Active Problems:   Type 2 diabetes mellitus with peripheral neuropathy (HCC)   Essential hypertension   Depression   Hyperlipidemia   Gastroesophageal reflux disease without esophagitis   CKD (chronic kidney disease), stage III (HCC)   Acute respiratory failure with hypoxia (HCC)   Acute renal failure superimposed on stage 4 chronic kidney disease (HCC)   Coronary artery disease   Severe sepsis (HCC)  Severe sepsis and acute hypoxic respiratory failure secondary to community-acquired pneumonia: Patient meets severe sepsis criteria based on temperature of 100.6, tachycardia, tachypnea and leukocytosis of 15 as well as acute hypoxia.  No lactic acid drawn yesterday.  Urine streptococcal antigen negative.  Legionella pending.  Sputum culture pending.  Leukocytosis improving, last low-grade temperature spike on 06/05/2021 at 1:28 PM.  Procalcitonin 1.43.  Blood culture negative.  Leukocytosis improving.  He is afebrile now.  We will continue Rocephin and Zithromax.  Breathing is improving.  He is weaned to room air and saturating 100%.  Mild metabolic acidosis and AKI on CKD stage IV: Patient's baseline creatinine is 2.7 with GFR of 26, presented with 3.43 which worsened slightly more again today with creatinine of 5.07.  Looks like he has had almost a liter of urine output in last 24 hours.  CO2 also going down.  Nephrology on board and  managing.  Chest pain: Patient also complained of chest pain, mostly after coughing.  He also tells me that he had herpes zoster at the right anterior chest going all the way under the right axilla 3 months ago and he continues to have pain there despite of being on gabapentin.  Troponin only slightly elevated on almost flat.  Echo once again showed 60 to 65% ejection fraction with grade 2 diastolic dysfunction and no wall motion abnormality.  Acute on chronic chronic diastolic congestive heart failure: Patient had elevated BNP but initial chest x-ray negative for pulm edema or vascular congestion.  On 06/05/2021 around noon, patient had worsening shortness of breath and he had crackles on exam, chest x-ray was done and at that point in time this showed vascular congestion/pulmonary edema.  We gave him 1 dose of Lasix, he felt better.  He was given another dose of Lasix by nephrology on 06/06/2021.  From respiratory standpoint, he has improved significantly and is on room air but his creatinine continues to get worse.  Lasix was held yesterday and he was given fluid challenge but creatinine still got worse so now nephrology is giving him high-dose of IV Lasix changing.    Essential hypertension: Blood pressure controlled.  Continue home medications which includes amlodipine, finasteride, hydralazine and metoprolol.  Oral candidiasis: Continue nystatin swish and swallow.  Hyperlipidemia: Continue statin.  Diabetes mellitus type 2: Blood sugar controlled on SSI.  GERD: Continue PPI.  Neuropathy: Continue home nortriptyline and gabapentin.  Chronic mild depression: Continue Lexapro.  DVT prophylaxis: enoxaparin (LOVENOX) injection 30 mg Start: 06/04/21 1915   Code Status: Full Code  Family Communication:  None present  at bedside.  Plan of care discussed with patient in length and he/she verbalized understanding and agreed with it.  Status is: Inpatient Remains inpatient appropriate because:  Patient is still very sick with rising creatinine.   Estimated body mass index is 28.22 kg/m as calculated from the following:   Height as of this encounter: '6\' 2"'$  (1.88 m).   Weight as of this encounter: 99.7 kg.    Nutritional Assessment: Body mass index is 28.22 kg/m.[name] Seen by dietician.  I agree with the assessment and plan as outlined below: Nutrition Status:        . Skin Assessment: I have examined the patient's skin and I agree with the wound assessment as performed by the wound care RN as outlined below:    Consultants:  Nephrology  Procedures:  None  Antimicrobials:  Anti-infectives (From admission, onward)    Start     Dose/Rate Route Frequency Ordered Stop   06/06/21 1800  azithromycin (ZITHROMAX) tablet 500 mg        500 mg Oral Daily 06/06/21 1114 06/09/21 1759   06/05/21 1800  cefTRIAXone (ROCEPHIN) 2 g in sodium chloride 0.9 % 100 mL IVPB        2 g 200 mL/hr over 30 Minutes Intravenous Every 24 hours 06/04/21 1910 06/09/21 1759   06/05/21 1800  azithromycin (ZITHROMAX) 500 mg in sodium chloride 0.9 % 250 mL IVPB  Status:  Discontinued        500 mg 250 mL/hr over 60 Minutes Intravenous Every 24 hours 06/04/21 1910 06/06/21 1114   06/04/21 1815  cefTRIAXone (ROCEPHIN) 2 g in sodium chloride 0.9 % 100 mL IVPB        2 g 200 mL/hr over 30 Minutes Intravenous  Once 06/04/21 1812 06/04/21 2200   06/04/21 1815  azithromycin (ZITHROMAX) 500 mg in sodium chloride 0.9 % 250 mL IVPB        500 mg 250 mL/hr over 60 Minutes Intravenous  Once 06/04/21 1812 06/04/21 2214         Subjective:  Seen and examined.  Complains of weakness.  No shortness of breath.  No other complaint.  Objective: Vitals:   06/07/21 2300 06/07/21 2321 06/08/21 0535 06/08/21 0821  BP:  (!) 157/75 (!) 156/77 (!) 160/77  Pulse:  95 92 92  Resp: '20 12 20 20  '$ Temp:  99.3 F (37.4 C) 99.6 F (37.6 C) 98.8 F (37.1 C)  TempSrc:  Oral Axillary Oral  SpO2:  100% 100% 100%   Weight:   99.7 kg   Height:        Intake/Output Summary (Last 24 hours) at 06/08/2021 1153 Last data filed at 06/08/2021 2229 Gross per 24 hour  Intake 966.65 ml  Output 925 ml  Net 41.65 ml    Filed Weights   06/06/21 0646 06/07/21 0438 06/08/21 0535  Weight: 102.3 kg 100.7 kg 99.7 kg    Examination:  General exam: Appears calm and comfortable  Respiratory system: Clear to auscultation. Respiratory effort normal. Cardiovascular system: S1 & S2 heard, RRR. No JVD, murmurs, rubs, gallops or clicks. No pedal edema. Gastrointestinal system: Abdomen is nondistended, soft and nontender. No organomegaly or masses felt. Normal bowel sounds heard. Central nervous system: Alert and oriented. No focal neurological deficits. Extremities: Symmetric 5 x 5 power. Skin: No rashes, lesions or ulcers.  Psychiatry: Judgement and insight appear normal. Mood & affect appropriate.   Data Reviewed: I have personally reviewed following labs and imaging studies  CBC:  Recent Labs  Lab 06/04/21 1620 06/05/21 0414 06/06/21 0446 06/08/21 0208  WBC 15.0* 13.0* 12.0* 7.8  NEUTROABS  --   --  10.2* 6.2  HGB 10.2* 9.0* 9.0* 8.4*  HCT 30.4* 27.4* 26.5* 26.2*  MCV 89.4 90.7 88.6 91.3  PLT 306 249 246 606    Basic Metabolic Panel: Recent Labs  Lab 06/04/21 1620 06/05/21 0414 06/06/21 0446 06/07/21 0635 06/08/21 0208  NA 139 140 130* 136 132*  K 3.8 3.8 3.6 4.0 3.9  CL 109 113* 104 106 107  CO2 19* 20* 18* 17* 15*  GLUCOSE 169* 128* 146* 110* 142*  BUN 47* 52* 62* 75* 82*  CREATININE 3.43* 3.74* 4.35* 4.87* 5.07*  CALCIUM 8.5* 8.0* 7.9* 8.4* 8.1*  MG  --   --  1.7  --  2.0    GFR: Estimated Creatinine Clearance: 19.5 mL/min (A) (by C-G formula based on SCr of 5.07 mg/dL (H)). Liver Function Tests: Recent Labs  Lab 06/05/21 0414  AST 14*  ALT 13  ALKPHOS 61  BILITOT 0.7  PROT 6.1*  ALBUMIN 2.7*    No results for input(s): LIPASE, AMYLASE in the last 168 hours. No results  for input(s): AMMONIA in the last 168 hours. Coagulation Profile: No results for input(s): INR, PROTIME in the last 168 hours. Cardiac Enzymes: No results for input(s): CKTOTAL, CKMB, CKMBINDEX, TROPONINI in the last 168 hours. BNP (last 3 results) No results for input(s): PROBNP in the last 8760 hours. HbA1C: No results for input(s): HGBA1C in the last 72 hours.  CBG: Recent Labs  Lab 06/07/21 0618 06/07/21 1103 06/07/21 1715 06/07/21 2015 06/08/21 0536  GLUCAP 107* 117* 132* 117* 120*    Lipid Profile: No results for input(s): CHOL, HDL, LDLCALC, TRIG, CHOLHDL, LDLDIRECT in the last 72 hours. Thyroid Function Tests: No results for input(s): TSH, T4TOTAL, FREET4, T3FREE, THYROIDAB in the last 72 hours. Anemia Panel: No results for input(s): VITAMINB12, FOLATE, FERRITIN, TIBC, IRON, RETICCTPCT in the last 72 hours. Sepsis Labs: Recent Labs  Lab 06/05/21 0414 06/08/21 0208  PROCALCITON 1.43 1.46     Recent Results (from the past 240 hour(s))  Blood culture (routine x 2)     Status: None (Preliminary result)   Collection Time: 06/04/21  6:17 PM   Specimen: BLOOD LEFT ARM  Result Value Ref Range Status   Specimen Description BLOOD LEFT ARM  Final   Special Requests   Final    BOTTLES DRAWN AEROBIC AND ANAEROBIC Blood Culture results may not be optimal due to an inadequate volume of blood received in culture bottles   Culture   Final    NO GROWTH 4 DAYS Performed at [facility], [place] [address], [place], [place] [address]    Report Status PENDING  Incomplete  Resp Panel by RT-PCR (Flu A&B, Covid) Peripheral     Status: None   Collection Time: 06/04/21  7:08 PM   Specimen: Peripheral; Nasopharyngeal(NP) swabs in vial transport medium  Result Value Ref Range Status   SARS Coronavirus 2 by RT PCR NEGATIVE NEGATIVE Final    Comment: (NOTE) SARS-CoV-2 target nucleic acids are NOT DETECTED.  The SARS-CoV-2 RNA is generally detectable in upper  respiratory specimens during the acute phase of infection. The lowest concentration of SARS-CoV-2 viral copies this assay can detect is 138 copies/mL. A negative result does not preclude SARS-Cov-2 infection and should not be used as the sole basis for treatment or other patient management decisions. A negative result may  occur with  improper specimen collection/handling, submission of specimen other than nasopharyngeal swab, presence of viral mutation(s) within the areas targeted by this assay, and inadequate number of viral copies(<138 copies/mL). A negative result must be combined with clinical observations, patient history, and epidemiological information. The expected result is Negative.  Fact Sheet for Patients:  EntrepreneurPulse.com.au  Fact Sheet for Healthcare Providers:  IncredibleEmployment.be  This test is no t yet approved or cleared by the [place] FDA and  has been authorized for detection and/or diagnosis of SARS-CoV-2 by FDA under an Emergency Use Authorization (EUA). This EUA will remain  in effect (meaning this test can be used) for the duration of the COVID-19 declaration under Section 564(b)(1) of the Act, 21 U.S.C.section 360bbb-3(b)(1), unless the authorization is terminated  or revoked sooner.       Influenza A by PCR NEGATIVE NEGATIVE Final   Influenza B by PCR NEGATIVE NEGATIVE Final    Comment: (NOTE) The Xpert Xpress SARS-CoV-2/FLU/RSV plus assay is intended as an aid in the diagnosis of influenza from Nasopharyngeal swab specimens and should not be used as a sole basis for treatment. Nasal washings and aspirates are unacceptable for Xpert Xpress SARS-CoV-2/FLU/RSV testing.  Fact Sheet for Patients: EntrepreneurPulse.com.au  Fact Sheet for Healthcare Providers: IncredibleEmployment.be  This test is not yet approved or cleared by the [place] FDA and has been  authorized for detection and/or diagnosis of SARS-CoV-2 by FDA under an Emergency Use Authorization (EUA). This EUA will remain in effect (meaning this test can be used) for the duration of the COVID-19 declaration under Section 564(b)(1) of the Act, 21 U.S.C. section 360bbb-3(b)(1), unless the authorization is terminated or revoked.  Performed at [facility], [place] [address] [place], [place] [address]   Blood culture (routine x 2)     Status: None (Preliminary result)   Collection Time: 06/04/21  7:19 PM   Specimen: BLOOD RIGHT ARM  Result Value Ref Range Status   Specimen Description BLOOD RIGHT ARM  Final   Special Requests   Final    BOTTLES DRAWN AEROBIC AND ANAEROBIC Blood Culture adequate volume   Culture   Final    NO GROWTH 4 DAYS Performed at [facility], [place] [address], [place], [place] [address]    Report Status PENDING  Incomplete      Radiology Studies: US RENAL  Result Date: 06/06/2021 CLINICAL DATA:  Inpatient. Renal failure. Stage 4 chronic kidney disease. EXAM: RENAL / URINARY TRACT ULTRASOUND COMPLETE COMPARISON:  02/05/2021 renal sonogram. FINDINGS: Right Kidney: Renal measurements: 11.2 x 6.1 x 6.4 cm = volume: 229 mL. Limited visualization of the lower pole due to overlying bowel gas. Echogenic right renal parenchyma, normal thickness. No renal masses. No hydronephrosis. Left Kidney: Renal measurements: 11.6 x 7.3 x 6.6 = volume: 295 cc. Echogenic renal parenchyma, normal thickness. No renal masses. No hydronephrosis. Echogenicity within normal limits. No mass or hydronephrosis visualized. Bladder: Bladder not visualized, either due to empty bladder and/or overlying bowel gas. Other: Trace simple fluid in Morrison's pouch. IMPRESSION: 1. No hydronephrosis. 2. Echogenic normal size kidneys, compatible with reported clinical history of nonspecific acute on chronic renal parenchymal disease. 3. Nonvisualization of the urinary bladder. 4. Trace simple fluid  in Morrison's pouch. Electronically Signed   By: Ilona Sorrel M.D.   On: 06/06/2021 15:09    Scheduled Meds:  amLODipine  10 mg Oral Daily   aspirin EC  81 mg Oral Daily   atorvastatin  40  mg Oral Daily   azithromycin  500 mg Oral Daily   Chlorhexidine Gluconate Cloth  6 each Topical Q0600   enoxaparin (LOVENOX) injection  30 mg Subcutaneous Q24H   escitalopram  20 mg Oral Daily   finasteride  5 mg Oral Daily   gabapentin  200 mg Oral Daily   hydrALAZINE  100 mg Oral TID   insulin aspart  0-15 Units Subcutaneous TID WC   insulin aspart  0-5 Units Subcutaneous QHS   metoprolol succinate  50 mg Oral Daily   nortriptyline  25 mg Oral QHS   nystatin  5 mL Oral QID   pantoprazole  40 mg Oral Daily   sodium bicarbonate  650 mg Oral BID   sodium chloride flush  3 mL Intravenous Q12H   Continuous Infusions:  sodium chloride 75 mL/hr at 06/07/21 1245   cefTRIAXone (ROCEPHIN)  IV 2 g (06/07/21 1741)   furosemide       LOS: 3 days   Darliss Cheney, MD [facility]  06/08/2021, 11:53 AM   *Please note that this is a verbal dictation therefore any spelling or grammatical errors are due to the "[place]" system interpretation.  Please page via [place] and do not message via secure chat for urgent patient care matters. Secure chat can be used for non urgent patient care matters.  How to contact the [facility] Attending or Consulting provider [place] or covering provider during after hours [place], for this patient?  Check the care team in [facility] and look for a) attending/consulting [facility] provider listed and b) the [facility] team listed. Page or secure chat [phone]. Log into [email or web address] and use [place]'s universal password to access. If you do not have the password, please contact the hospital operator. Locate the [facility] provider you are looking for under [facility] and page to a number that you can be directly reached. If you still have difficulty reaching the provider, please page the  [facility] (Director on Call) for the Hospitalists listed on amion for assistance.

## 2021-06-08 NOTE — Progress Notes (Addendum)
[facility] Progress Note   61 y.o. male HTN, DM with evidence of end organ damage with neuropathy + gastroparesis + retinopathy, HLD, HFpEF, CASHD, BPH, CKD4 presenting with shortness of breath with chest pain, nausea and vomiting. He did receive nitroglycerin with EMS.  He is on Lasix '40mg'$  twice daily at home and also has a foley placed by urology.    In the ED he required 2L O2 supplementation, BUN Cr 45/3.43 with possible RLL pneumonia on CXR. Patient treated with ceftriaxone and azithromycin. Influenza and COVID were negative. BNP noted to be 1151. Patient received Lasix '40mg'$  x1 on 5/22.   Assessment/ Plan:   Acute on chronic renal failure CKD IIIb - certainly has had progression of kidney disease with creatinine in the mid to high 2's end of 2022 and then 01/2021 cr was in the 3.3-3.6 range -> but last labs at [facility] Cr was 2.7 with Dr. Carolin Sicks. Patient has also had significant proteinuria already in 2021. 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 Relatively normal renal ultrasound 02/05/2021. Patient only net neg 190 ml during this hospitalization. On urine microscopy there are florid WBC's in the setting of a foley that was just placed last week by urology with 1L urine in the bladder, some coarse granular casts but otherwise not an active sediment. - No absolute indication for RRT and the patient appears to be  comfortable.  - FeNA 0.22%, urinalysis shows a lot of RBC and WBC (has a foley in place), renal ultrasound normal size and no e/o obstruction with foley in place. Renal function worsening and had dosed Lasix '80mg'$  IV x1 on 5/23 as the FenA  of 0.22% can be consistent with prerenal azotemia or heart failure.   He has not gotten better with lasix 40 -> 80 but has felt better each time the lasix was given. But bec renal function cont to worsen I gave a fluid challenge yesterday but renal function cont to worsen. He is essentially net even since the  hospitalization. He has grade II diastolic dysfunction by echocardiogram with preserved EF.  Will try higher dose lasix challenge with '120mg'$  BID x2 doses and see if there is a response, especially given that symptomatically he feels better when the Lasix is given.  - Monitor Daily I/Os, Daily weights  -Maintain MAP>65 for optimal renal perfusion.  -Avoid nephrotoxic medications including NSAIDs, contrast   Hypertension - resumed home regimen. CAP - on abx HFpEF - decompensated, see above. DM - per primary CASHD BPH on flomax and proxcar  Subjective:   Feeling a little better, cough with yellowish sputum. Mild shortness of breath. Felt better after Lasix. Has some nausea intermittently.   Objective:   BP (!) 160/77 (BP Location: Left Arm)   Pulse 92   Temp 98.8 F (37.1 C) (Oral)   Resp 20   Ht '6\' 2"'$  (1.88 m)   Wt 99.7 kg   SpO2 100%   BMI 28.22 kg/m   Intake/Output Summary (Last 24 hours) at 06/08/2021 1104 Last data filed at 06/08/2021 9201 Gross per 24 hour  Intake 966.65 ml  Output 925 ml  Net 41.65 ml   Weight change: -1 kg  Physical Exam: Gen alert, no distress, nasal O2  No carotid bruits Chest clear bilat to bases RRR no RG Abd soft ntnd no mass or ascites +bs GU foley in place Ext no pretib or UE edema, trace-1+ hip edema Neuro is alert, Ox 3 , nf, no asterixis  Imaging: US RENAL  Result Date: 06/06/2021 CLINICAL DATA:  Inpatient. Renal failure. Stage 4 chronic kidney disease. EXAM: RENAL / URINARY TRACT ULTRASOUND COMPLETE COMPARISON:  02/05/2021 renal sonogram. FINDINGS: Right Kidney: Renal measurements: 11.2 x 6.1 x 6.4 cm = volume: 229 mL. Limited visualization of the lower pole due to overlying bowel gas. Echogenic right renal parenchyma, normal thickness. No renal masses. No hydronephrosis. Left Kidney: Renal measurements: 11.6 x 7.3 x 6.6 = volume: 295 cc. Echogenic renal parenchyma, normal thickness. No renal masses. No hydronephrosis. Echogenicity  within normal limits. No mass or hydronephrosis visualized. Bladder: Bladder not visualized, either due to empty bladder and/or overlying bowel gas. Other: Trace simple fluid in Morrison's pouch. IMPRESSION: 1. No hydronephrosis. 2. Echogenic normal size kidneys, compatible with reported clinical history of nonspecific acute on chronic renal parenchymal disease. 3. Nonvisualization of the urinary bladder. 4. Trace simple fluid in Morrison's pouch. Electronically Signed   By: Ilona Sorrel M.D.   On: 06/06/2021 15:09    Labs: BMET Recent Labs  Lab 06/04/21 1620 06/05/21 0414 06/06/21 0446 06/07/21 0635 06/08/21 0208  NA 139 140 130* 136 132*  K 3.8 3.8 3.6 4.0 3.9  CL 109 113* 104 106 107  CO2 19* 20* 18* 17* 15*  GLUCOSE 169* 128* 146* 110* 142*  BUN 47* 52* 62* 75* 82*  CREATININE 3.43* 3.74* 4.35* 4.87* 5.07*  CALCIUM 8.5* 8.0* 7.9* 8.4* 8.1*   CBC Recent Labs  Lab 06/04/21 1620 06/05/21 0414 06/06/21 0446 06/08/21 0208  WBC 15.0* 13.0* 12.0* 7.8  NEUTROABS  --   --  10.2* 6.2  HGB 10.2* 9.0* 9.0* 8.4*  HCT 30.4* 27.4* 26.5* 26.2*  MCV 89.4 90.7 88.6 91.3  PLT 306 249 246 270    Medications:     amLODipine  10 mg Oral Daily   aspirin EC  81 mg Oral Daily   atorvastatin  40 mg Oral Daily   azithromycin  500 mg Oral Daily   Chlorhexidine Gluconate Cloth  6 each Topical Q0600   enoxaparin (LOVENOX) injection  30 mg Subcutaneous Q24H   escitalopram  20 mg Oral Daily   finasteride  5 mg Oral Daily   gabapentin  200 mg Oral Daily   hydrALAZINE  100 mg Oral TID   insulin aspart  0-15 Units Subcutaneous TID WC   insulin aspart  0-5 Units Subcutaneous QHS   metoprolol succinate  50 mg Oral Daily   nortriptyline  25 mg Oral QHS   nystatin  5 mL Oral QID   pantoprazole  40 mg Oral Daily   sodium chloride flush  3 mL Intravenous Q12H      Otelia Santee, MD 06/08/2021, 11:04 AM

## 2021-06-08 NOTE — Progress Notes (Signed)
Mobility Specialist Progress Note    06/08/21 1149  Mobility  Activity Ambulated with assistance in hallway  Level of Assistance Minimal assist, patient does 75% or more  Assistive Device Front wheel walker  Distance Ambulated (ft) 110 ft  Activity Response Tolerated well  $Mobility charge 1 Mobility   Pre-Mobility: 89 HR, 152/76 BP, 100% SpO2 During Mobility: 98 HR Post-Mobility: 91 HR, 100% SpO2  Pt received in bed and agreeable. C/o pain in arms and legs. Took x1 standing rest break leaning against the wall. Returned to chair with call bell in reach.    Hildred Alamin Mobility Specialist  Primary: 5N M.S. Phone: [phone] Secondary: 6N M.S. Phone: [phone]

## 2021-06-09 DIAGNOSIS — J189 Pneumonia, unspecified organism: Secondary | ICD-10-CM | POA: Diagnosis not present

## 2021-06-09 LAB — BASIC METABOLIC PANEL
Anion gap: 13 (ref 5–15)
BUN: 88 mg/dL — ABNORMAL HIGH (ref 6–20)
CO2: 15 mmol/L — ABNORMAL LOW (ref 22–32)
Calcium: 8.3 mg/dL — ABNORMAL LOW (ref 8.9–10.3)
Chloride: 108 mmol/L (ref 98–111)
Creatinine, Ser: 4.96 mg/dL — ABNORMAL HIGH (ref 0.61–1.24)
GFR, Estimated: 13 mL/min — ABNORMAL LOW (ref >60)
Glucose, Bld: 104 mg/dL — ABNORMAL HIGH (ref 70–99)
Potassium: 3.9 mmol/L (ref 3.5–5.1)
Sodium: 136 mmol/L (ref 135–145)

## 2021-06-09 LAB — CULTURE, BLOOD (ROUTINE X 2)
Culture: NO GROWTH
Culture: NO GROWTH
Special Requests: ADEQUATE

## 2021-06-09 LAB — GLUCOSE, CAPILLARY
Glucose-Capillary: 107 mg/dL — ABNORMAL HIGH (ref 70–99)
Glucose-Capillary: 137 mg/dL — ABNORMAL HIGH (ref 70–99)
Glucose-Capillary: 126 mg/dL — ABNORMAL HIGH (ref 70–99)

## 2021-06-09 LAB — FERRITIN: Ferritin: 295 ng/mL (ref 24–336)

## 2021-06-09 LAB — IRON AND TIBC
Iron: 15 ug/dL — ABNORMAL LOW (ref 45–182)
Saturation Ratios: 8 % — ABNORMAL LOW (ref 17.9–39.5)
TIBC: 190 ug/dL — ABNORMAL LOW (ref 250–450)
UIBC: 175 ug/dL

## 2021-06-09 MED ORDER — FUROSEMIDE 10 MG/ML IJ SOLN
80.0000 mg | Freq: Three times a day (TID) | INTRAMUSCULAR | Status: AC
  Administered 2021-06-09 – 2021-06-11 (×7): 80 mg via INTRAVENOUS
  Filled 2021-06-09 (×7): qty 8

## 2021-06-09 NOTE — Evaluation (Signed)
Physical Therapy Evaluation Patient Details Name: Ryan Jenkins MRN: [id number] DOB: 10-30-1960 Today's Date: 06/09/2021  History of Present Illness  Patient is a 61 yo male presenting to the ED with chest pain, mild SOB, nausea, and vomiting x 12 hours on 06/04/21. Admitted with acute hypoxic respiratory failure and severe sepsis secondary to pneumonia same day. PMH includes: diabetes with neuropathy and gastroparesis, hypertension, CKD 4, depression, anxiety, hyperlipidemia, GERD, CHF, CAD, anemia  Clinical Impression  Pt admitted with above diagnosis. Pt was able to take a few steps chair to bed due to significant pain in bil LES.  Pt reports the pain has gotten worse over the last week.  Pt needed max assist to get out of chair to standing and min assist for transfers.  May need SNF short term if pain issues arent controlled when time for d/c home. Will follow acutely. Pt currently with functional limitations due to the deficits listed below (see PT Problem List). Pt will benefit from skilled PT to increase their independence and safety with mobility to allow discharge to the venue listed below.          Recommendations for follow up therapy are one component of a multi-disciplinary discharge planning process, led by the attending physician.  Recommendations may be updated based on patient status, additional functional criteria and insurance authorization.  Follow Up Recommendations Skilled nursing-short term rehab (<3 hours/day) (unless right LE pain improves)    Assistance Recommended at Discharge Intermittent Supervision/Assistance  Patient can return home with the following  A lot of help with walking and/or transfers;A lot of help with bathing/dressing/bathroom;Help with stairs or ramp for entrance;Assist for transportation    Equipment Recommendations None recommended by PT  Recommendations for Other Services       Functional Status Assessment Patient has had a recent decline in  their functional status and demonstrates the ability to make significant improvements in function in a reasonable and predictable amount of time.     Precautions / Restrictions Precautions Precautions: Fall Precaution Comments: watch O2 Restrictions Weight Bearing Restrictions: No      Mobility  Bed Mobility               General bed mobility comments: patient up in chair upon arrival    Transfers Overall transfer level: Needs assistance Equipment used: Rolling walker (2 wheels) Transfers: Sit to/from Stand, Bed to chair/wheelchair/BSC Sit to Stand: Max assist Stand pivot transfers: Min assist, Min guard         General transfer comment: Pt needed max assist to stand fromlow recliner.  Had to assist pt to widen BOS as well as use momentum to come to standing. Pt took a few pivotal steps to bed stating he could not walk to door due to significant pain bil LEs hips to feet.    Ambulation/Gait                  Stairs            Wheelchair Mobility    Modified Rankin (Stroke Patients Only)       Balance Overall balance assessment: Mild deficits observed, not formally tested                                           Pertinent Vitals/Pain Pain Assessment Pain Assessment: Faces Faces Pain Scale: Hurts even more Pain Location: bilateral  legs due to neuropathy Pain Descriptors / Indicators: Discomfort, Grimacing, Guarding Pain Intervention(s): Limited activity within patient's tolerance, Monitored during session, Repositioned    Home Living Family/patient expects to be discharged to:: Private residence Living Arrangements: Other relatives (Brother, sister lives close by) Available Help at Discharge: Family;Personal care attendant;Available PRN/intermittently Type of Home: Apartment Home Access: Level entry       Home Layout: One level Home Equipment: Conservation officer, nature (2 wheels);Rollator (4 wheels);BSC/3in1;Shower  seat Additional Comments: Mod indep with household mobility    Prior Function Prior Level of Function : Independent/Modified Independent             Mobility Comments: walks with RW at baseline, does not drive ADLs Comments: independent with ADLs, intermittent assistance with IADLs     Hand Dominance   Dominant Hand: Right    Extremity/Trunk Assessment   Upper Extremity Assessment Upper Extremity Assessment: Defer to OT evaluation    Lower Extremity Assessment Lower Extremity Assessment: RLE deficits/detail;LLE deficits/detail RLE Deficits / Details: grossly 3/5 RLE Sensation: history of peripheral neuropathy LLE Deficits / Details: grossly 3/5 LLE Sensation: history of peripheral neuropathy    Cervical / Trunk Assessment Cervical / Trunk Assessment: Kyphotic (minimally)  Communication   Communication: No difficulties  Cognition Arousal/Alertness: Awake/alert Behavior During Therapy: WFL for tasks assessed/performed Overall Cognitive Status: Within Functional Limits for tasks assessed                                 General Comments: alert and oriented        General Comments General comments (skin integrity, edema, etc.): VSS on RA    Exercises General Exercises - Lower Extremity Ankle Circles/Pumps: AROM, Both, 5 reps, Supine Long Arc Quad: AROM, Both, 10 reps, Seated   Assessment/Plan    PT Assessment Patient needs continued PT services  PT Problem List Decreased activity tolerance;Decreased balance;Decreased mobility;Decreased knowledge of use of DME;Decreased safety awareness;Decreased knowledge of precautions;Pain       PT Treatment Interventions DME instruction;Gait training;Therapeutic activities;Functional mobility training;Therapeutic exercise;Balance training;Patient/family education    PT Goals (Current goals can be found in the Care Plan section)  Acute Rehab PT Goals Patient Stated Goal: to go home PT Goal Formulation: With  patient Time For Goal Achievement: 06/23/21 Potential to Achieve Goals: Good    Frequency Min 3X/week     Co-evaluation               AM-PAC PT "6 Clicks" Mobility  Outcome Measure Help needed turning from your back to your side while in a flat bed without using bedrails?: A Little Help needed moving from lying on your back to sitting on the side of a flat bed without using bedrails?: A Little Help needed moving to and from a bed to a chair (including a wheelchair)?: A Little Help needed standing up from a chair using your arms (e.g., wheelchair or bedside chair)?: A Lot Help needed to walk in hospital room?: A Lot Help needed climbing 3-5 steps with a railing? : A Lot 6 Click Score: 15    End of Session Equipment Utilized During Treatment: Gait belt Activity Tolerance: Patient limited by fatigue Patient left: with call bell/phone within reach;in bed;with bed alarm set;with family/visitor present Nurse Communication: Mobility status PT Visit Diagnosis: Unsteadiness on feet (R26.81);Muscle weakness (generalized) (M62.81)    Time: 3875-6433 PT Time Calculation (min) (ACUTE ONLY): 23 min   Charges:   PT  Evaluation $PT Eval Moderate Complexity: 1 Mod PT Treatments $Therapeutic Activity: 8-22 mins        Ferrah Panagopoulos M,PT Acute Rehab Services [phone] (pager)   Alvira Philips 06/09/2021, 4:27 PM

## 2021-06-09 NOTE — Progress Notes (Signed)
Patient ID: Ryan Jenkins, male   DOB: 26-Sep-1960, 61 y.o.   MRN: [id number] [facility] Progress Note   Assessment/ Plan:   1. Acute kidney Injury on chronic kidney disease stage IIIb (baseline creatinine around 2.7).  Underlying chronic kidney disease suspected to be from hypertension and diabetes and acute kidney injury.  Etiology of AKI suspected to be ATN from pneumonia/sepsis versus cardiorenal in the setting of decompensated diastolic heart failure.  Fair response to furosemide with unchanged renal function; will convert him from furosemide 120 mg twice daily to 80 mg 3 times daily to help promote diuresis.  He does not have any acute indications for dialysis at this time. 2.  Community-acquired pneumonia/sepsis: With acute hypoxic respiratory failure earlier and improving with antimicrobial therapy-ceftriaxone and azithromycin. 3.  Acute exacerbation of diastolic heart failure: Ongoing intermittent furosemide doses to try and improve oxygenation. 4.  Metabolic acidosis: Non-anion gap and likely from progressive chronic kidney disease, will continue oral sodium bicarbonate at low-dose to limit sodium loading. 5.  Anemia: Without overt blood loss, likely secondary to chronic kidney disease and exacerbated by acute illness.  Check iron studies and begin ESA.  Subjective:   Reports to be feeling fair and denies any chest pain or shortness of breath   Objective:   BP (!) 155/74 (BP Location: Left Arm)   Pulse 89   Temp 98.9 F (37.2 C) (Oral)   Resp (!) 21   Ht '6\' 2"'$  (1.88 m)   Wt 100.2 kg   SpO2 100%   BMI 28.36 kg/m   Intake/Output Summary (Last 24 hours) at 06/09/2021 0707 Last data filed at 06/09/2021 0500 Gross per 24 hour  Intake 243 ml  Output 1450 ml  Net -1207 ml   Weight change: 0.5 kg  Physical Exam: Gen: Comfortably resting flat in bed, not on supplemental oxygen CVS: Pulse regular rhythm, normal rate, S1 and S2 normal Resp: Anteriorly clear to  auscultation, no rales/rhonchi Abd: Soft, obese, nontender, bowel sounds normal Ext: Trace lower extremity edema  Imaging: No results found.  Labs: BMET Recent Labs  Lab 06/04/21 1620 06/05/21 0414 06/06/21 0446 06/07/21 0635 06/08/21 0208 06/09/21 0148  NA 139 140 130* 136 132* 136  K 3.8 3.8 3.6 4.0 3.9 3.9  CL 109 113* 104 106 107 108  CO2 19* 20* 18* 17* 15* 15*  GLUCOSE 169* 128* 146* 110* 142* 104*  BUN 47* 52* 62* 75* 82* 88*  CREATININE 3.43* 3.74* 4.35* 4.87* 5.07* 4.96*  CALCIUM 8.5* 8.0* 7.9* 8.4* 8.1* 8.3*   CBC Recent Labs  Lab 06/04/21 1620 06/05/21 0414 06/06/21 0446 06/08/21 0208  WBC 15.0* 13.0* 12.0* 7.8  NEUTROABS  --   --  10.2* 6.2  HGB 10.2* 9.0* 9.0* 8.4*  HCT 30.4* 27.4* 26.5* 26.2*  MCV 89.4 90.7 88.6 91.3  PLT 306 249 246 270    Medications:     amLODipine  10 mg Oral Daily   aspirin EC  81 mg Oral Daily   atorvastatin  40 mg Oral Daily   Chlorhexidine Gluconate Cloth  6 each Topical Q0600   enoxaparin (LOVENOX) injection  30 mg Subcutaneous Q24H   escitalopram  20 mg Oral Daily   finasteride  5 mg Oral Daily   gabapentin  200 mg Oral Daily   hydrALAZINE  100 mg Oral TID   insulin aspart  0-15 Units Subcutaneous TID WC   insulin aspart  0-5 Units Subcutaneous QHS   metoprolol succinate  50 mg Oral Daily   nortriptyline  25 mg Oral QHS   nystatin  5 mL Oral QID   pantoprazole  40 mg Oral Daily   sodium bicarbonate  650 mg Oral BID   sodium chloride flush  3 mL Intravenous Q12H   Elmarie Shiley, MD 06/09/2021, 7:07 AM

## 2021-06-09 NOTE — Evaluation (Signed)
Occupational Therapy Evaluation Patient Details Name: Ryan Jenkins MRN: [id number] DOB: [birth year] Today's Date: 06/09/2021   History of Present Illness Patient is a 61 yo male presenting to the ED with chest pain, mild SOB, nausea, and vomiting x 12 hours on 06/04/21. Admitted with acute hypoxic respiratory failure and severe sepsis secondary to pneumonia same day. PMH includes: diabetes with neuropathy and gastroparesis, hypertension, CKD 4, depression, anxiety, hyperlipidemia, GERD, CHF, CAD, anemia   Clinical Impression   Prior to this admission, patient living with brother in a first floor apartment and has a PCA that comes MWF for 4 hours who typically only assists for IADLS and provides supervision so patient can shower. Patient reports independence with functional mobility with RW, and with ADLs, and does not drive. Patient presenting with decreased activity tolerance, weakness, and pain in bilateral legs due to neuropathy. Currently patient min A for ADLs, and politely declining transfers due to pain, but has been mobilizing with mobility specialists. OT will continue to follow acutely, but recommending HHOT at discharge to promote functional independence at discharge.      Recommendations for follow up therapy are one component of a multi-disciplinary discharge planning process, led by the attending physician.  Recommendations may be updated based on patient status, additional functional criteria and insurance authorization.   Follow Up Recommendations  Home health OT    Assistance Recommended at Discharge Intermittent Supervision/Assistance  Patient can return home with the following A little help with walking and/or transfers;A little help with bathing/dressing/bathroom;Assistance with cooking/housework;Direct supervision/assist for medications management;Direct supervision/assist for financial management;Help with stairs or ramp for entrance;Assist for transportation     Functional Status Assessment  Patient has had a recent decline in their functional status and demonstrates the ability to make significant improvements in function in a reasonable and predictable amount of time.  Equipment Recommendations  None recommended by OT (Patient has DME needed)    Recommendations for Other Services       Precautions / Restrictions Precautions Precautions: Fall Precaution Comments: watch O2 Restrictions Weight Bearing Restrictions: No      Mobility Bed Mobility               General bed mobility comments: patient up in chair upon arrival    Transfers                   General transfer comment: patient deferring due to bilateral leg pain      Balance Overall balance assessment: Mild deficits observed, not formally tested                                         ADL either performed or assessed with clinical judgement   ADL Overall ADL's : Needs assistance/impaired Eating/Feeding: Set up;Sitting   Grooming: Set up;Sitting   Upper Body Bathing: Set up;Sitting   Lower Body Bathing: Minimal assistance;Sit to/from stand;Sitting/lateral leans   Upper Body Dressing : Set up;Sitting   Lower Body Dressing: Minimal assistance;Sit to/from stand;Sitting/lateral leans   Toilet Transfer: Minimal assistance;Ambulation;Rolling walker (2 wheels)   Toileting- Clothing Manipulation and Hygiene: Min guard;Sitting/lateral lean       Functional mobility during ADLs: Minimal assistance;Rolling walker (2 wheels) General ADL Comments: Patient presenting with decreased activity tolerance, weakness, and bilateral leg pain due to nueropathy     Vision Baseline Vision/History: 1 Wears glasses Ability to See in Adequate Light:  0 Adequate Patient Visual Report: No change from baseline       Perception     Praxis      Pertinent Vitals/Pain Pain Assessment Pain Assessment: Faces Faces Pain Scale: Hurts even more Pain  Location: bilateral legs due to neuropathy Pain Descriptors / Indicators: Discomfort, Grimacing, Guarding Pain Intervention(s): Limited activity within patient's tolerance, Monitored during session     Hand Dominance Right   Extremity/Trunk Assessment Upper Extremity Assessment Upper Extremity Assessment: Generalized weakness   Lower Extremity Assessment Lower Extremity Assessment: Defer to PT evaluation   Cervical / Trunk Assessment Cervical / Trunk Assessment: Kyphotic (minimally)   Communication Communication Communication: No difficulties   Cognition Arousal/Alertness: Awake/alert Behavior During Therapy: WFL for tasks assessed/performed Overall Cognitive Status: Within Functional Limits for tasks assessed                                 General Comments: alert and oriented, but had not eaten lunch and there was a reminder on the white board to assist patient with making sure he remembers to order his lunch     General Comments  VSS on RA    Exercises     Shoulder Instructions      Home Living Family/patient expects to be discharged to:: Private residence Living Arrangements: Other relatives (Brother, sister lives close by) Available Help at Discharge: Family;Personal care attendant;Available PRN/intermittently Type of Home: Apartment Home Access: Level entry     Home Layout: One level     Bathroom Shower/Tub: Teacher, early years/pre: Standard     Home Equipment: Conservation officer, nature (2 wheels);Rollator (4 wheels);BSC/3in1;Shower seat          Prior Functioning/Environment Prior Level of Function : Independent/Modified Independent             Mobility Comments: walks with RW at baseline, does not drive ADLs Comments: independent with ADLs, intermittent assistance with IADLs        OT Problem List: Decreased range of motion;Decreased strength;Decreased activity tolerance;Impaired balance (sitting and/or standing);Decreased  coordination;Decreased cognition;Decreased safety awareness;Decreased knowledge of use of DME or AE;Decreased knowledge of precautions;Impaired sensation;Pain      OT Treatment/Interventions: Self-care/ADL training;Therapeutic exercise;Energy conservation;DME and/or AE instruction;Manual therapy;Therapeutic activities;Cognitive remediation/compensation;Patient/family education;Balance training    OT Goals(Current goals can be found in the care plan section) Acute Rehab OT Goals Patient Stated Goal: to get stronger OT Goal Formulation: With patient Time For Goal Achievement: 06/22/21 Potential to Achieve Goals: Good ADL Goals Pt Will Perform Grooming: Independently;standing Pt Will Perform Lower Body Bathing: Independently;sitting/lateral leans;sit to/from stand;with adaptive equipment Pt Will Perform Lower Body Dressing: Independently;with adaptive equipment;sit to/from stand;sitting/lateral leans Pt Will Transfer to Toilet: Independently;ambulating Additional ADL Goal #1: Patient will demonstrate increased activity tolerance to complete functional task in standing for 3-5 minutes without need for seated rest break to promote  independence at home. Additional ADL Goal #2: Patient will be able to verbalize 3 strategies for energy conservation for safe discharge home.  OT Frequency: Min 2X/week    Co-evaluation              AM-PAC OT "6 Clicks" Daily Activity     Outcome Measure Help from another person eating meals?: A Little Help from another person taking care of personal grooming?: A Little Help from another person toileting, which includes using toliet, bedpan, or urinal?: A Little Help from another person bathing (including washing, rinsing, drying)?: A  Little Help from another person to put on and taking off regular upper body clothing?: A Little Help from another person to put on and taking off regular lower body clothing?: A Little 6 Click Score: 18   End of Session Nurse  Communication: Mobility status  Activity Tolerance: Patient limited by fatigue;Patient limited by pain Patient left: in chair;with call bell/phone within reach;with chair alarm set  OT Visit Diagnosis: Unsteadiness on feet (R26.81);Other abnormalities of gait and mobility (R26.89);Muscle weakness (generalized) (M62.81);Pain Pain - Right/Left:  (Bilateral legs) Pain - part of body: Leg (Bilateral)                Time: 1438-8875 OT Time Calculation (min): 25 min Charges:  OT General Charges $OT Visit: 1 Visit OT Evaluation $OT Eval Moderate Complexity: 1 Mod OT Treatments $Self Care/Home Management : 8-22 mins  Corinne Ports E. Williams Dietrick, OTR/L Acute Rehabilitation Services [phone] [place] 06/09/2021, 2:58 PM

## 2021-06-09 NOTE — Progress Notes (Signed)
Mobility Specialist: Progress Note   06/09/21 1136  Mobility  Activity Ambulated with assistance in hallway  Level of Assistance Contact guard assist, steadying assist  Assistive Device Front wheel walker  Distance Ambulated (ft) 100 ft  Activity Response Tolerated fair  $Mobility charge 1 Mobility   Pre-Mobility: 86 HR, 98% SpO2 Post-Mobility: 100% SpO2  Pt received in the bed and agreeable to mobility. C/o R side and RLE pain during session. C/o UE fatigue by the end of the session. Stopped x4 during ambulation for brief standing breaks secondary to fatigue. Pt set up at the sink per request from NT. Will f/u later today for second bout of ambulation.   [facility] Heith Haigler Mobility Specialist Mobility Specialist 5 North: [phone] Mobility Specialist 6 [address]: [phone]

## 2021-06-09 NOTE — Progress Notes (Signed)
PROGRESS NOTE    [name]  ZWC:[id number] DOB: 08/16/1960 DOA: 06/04/2021 PCP: Armanda Heritage, NP   Brief Narrative:  Ryan Jenkins is a 61 y.o. male with medical history significant of diabetes with neuropathy and gastroparesis, hypertension, CKD 4, depression, anxiety, hyperlipidemia, GERD, CHF, CAD, anemia presented with multiple complaints including shortness of breath and chest pain and was admitted with acute hypoxic respiratory failure and severe sepsis secondary to pneumonia.  Assessment & Plan:   Principal Problem:   Pneumonia Active Problems:   Type 2 diabetes mellitus with peripheral neuropathy (HCC)   Essential hypertension   Depression   Hyperlipidemia   Gastroesophageal reflux disease without esophagitis   CKD (chronic kidney disease), stage III (HCC)   Acute respiratory failure with hypoxia (HCC)   Acute renal failure superimposed on stage 4 chronic kidney disease (HCC)   Coronary artery disease   Severe sepsis (HCC)  Severe sepsis and acute hypoxic respiratory failure secondary to community-acquired pneumonia: Patient meets severe sepsis criteria based on temperature of 100.6, tachycardia, tachypnea and leukocytosis of 15 as well as acute hypoxia.  No lactic acid drawn yesterday.  Urine streptococcal antigen negative.  Legionella pending.  Sputum culture pending.  Leukocytosis improving, last low-grade temperature spike on 06/05/2021 at 1:28 PM.  Procalcitonin 1.43.  Blood culture negative.  Leukocytosis improving.  He is afebrile now.  He completed 5 days of Rocephin and Zithromax on 06/09/2021.  We will repeat procalcitonin.  He is saturating 100% on room air.  Mild metabolic acidosis and AKI on CKD stage IV: Patient's baseline creatinine is 2.7 with GFR of 26, presented with 3.43 which worsened And peaked at 5.07, slightly better at 4.96 today.  CO2 still down, he remains on bicarb tablets.  He has been started on aggressive 3 times daily 80 mg IV Lasix  diuresis.  Management per nephrology.  Chest pain: Patient also complained of chest pain, mostly after coughing.  He also tells me that he had herpes zoster at the right anterior chest going all the way under the right axilla 3 months ago and he continues to have pain there despite of being on gabapentin.  Troponin only slightly elevated on almost flat.  Echo once again showed 60 to 65% ejection fraction with grade 2 diastolic dysfunction and no wall motion abnormality.  Acute on chronic chronic diastolic congestive heart failure: Patient had elevated BNP but initial chest x-ray negative for pulm edema or vascular congestion.  On 06/05/2021 around noon, patient had worsening shortness of breath and he had crackles on exam, chest x-ray was done and at that point in time this showed vascular congestion/pulmonary edema.  We gave him 1 dose of Lasix, he felt better.  He was given another dose of Lasix by nephrology on 06/06/2021.  He has been started on aggressive Lasix dosage by nephrology.  Essential hypertension: Blood pressure controlled.  Continue home medications which includes amlodipine, finasteride, hydralazine and metoprolol.  Oral candidiasis: Continue nystatin swish and swallow.  Hyperlipidemia: Continue statin.  Diabetes mellitus type 2: Blood sugar controlled on SSI.  GERD: Continue PPI.  Neuropathy: Continue home nortriptyline and gabapentin.  Chronic mild depression: Continue Lexapro.  DVT prophylaxis: enoxaparin (LOVENOX) injection 30 mg Start: 06/04/21 1915   Code Status: Full Code  Family Communication:  None present at bedside.  Plan of care discussed with patient in length and he/she verbalized understanding and agreed with it.  Status is: Inpatient Remains inpatient appropriate because: Patient is still very sick with  rising creatinine.   Estimated body mass index is 28.36 kg/m as calculated from the following:   Height as of this encounter: '6\' 2"'$  (1.88 m).   Weight as  of this encounter: 100.2 kg.    Nutritional Assessment: Body mass index is 28.36 kg/m.[name] Seen by dietician.  I agree with the assessment and plan as outlined below: Nutrition Status:        . Skin Assessment: I have examined the patient's skin and I agree with the wound assessment as performed by the wound care RN as outlined below:    Consultants:  Nephrology  Procedures:  None  Antimicrobials:  Anti-infectives (From admission, onward)    Start     Dose/Rate Route Frequency Ordered Stop   06/06/21 1800  azithromycin (ZITHROMAX) tablet 500 mg        500 mg Oral Daily 06/06/21 1114 06/08/21 1652   06/05/21 1800  cefTRIAXone (ROCEPHIN) 2 g in sodium chloride 0.9 % 100 mL IVPB        2 g 200 mL/hr over 30 Minutes Intravenous Every 24 hours 06/04/21 1910 06/08/21 1726   06/05/21 1800  azithromycin (ZITHROMAX) 500 mg in sodium chloride 0.9 % 250 mL IVPB  Status:  Discontinued        500 mg 250 mL/hr over 60 Minutes Intravenous Every 24 hours 06/04/21 1910 06/06/21 1114   06/04/21 1815  cefTRIAXone (ROCEPHIN) 2 g in sodium chloride 0.9 % 100 mL IVPB        2 g 200 mL/hr over 30 Minutes Intravenous  Once 06/04/21 1812 06/04/21 2200   06/04/21 1815  azithromycin (ZITHROMAX) 500 mg in sodium chloride 0.9 % 250 mL IVPB        500 mg 250 mL/hr over 60 Minutes Intravenous  Once 06/04/21 1812 06/04/21 2214         Subjective:  Seen and examined.  Complains of weakness.  No other complaint. Objective: Vitals:   06/09/21 0456 06/09/21 0500 06/09/21 0846 06/09/21 0847  BP: (!) 155/74 (!) 155/74 (!) 157/78 (!) 157/78  Pulse: 89   93  Resp: (!) '21 20 18 18  '$ Temp: 98.9 F (37.2 C)     TempSrc: Oral     SpO2: 100%   100%  Weight: 100.2 kg     Height:        Intake/Output Summary (Last 24 hours) at 06/09/2021 1131 Last data filed at 06/09/2021 0800 Gross per 24 hour  Intake 302 ml  Output 1450 ml  Net -1148 ml    Filed Weights   06/07/21 0438 06/08/21 0535 06/09/21  0456  Weight: 100.7 kg 99.7 kg 100.2 kg    Examination:  General exam: Appears calm and comfortable, appears weak Respiratory system: Clear to auscultation. Respiratory effort normal. Cardiovascular system: S1 & S2 heard, RRR. No JVD, murmurs, rubs, gallops or clicks. No pedal edema. Gastrointestinal system: Abdomen is nondistended, soft and nontender. No organomegaly or masses felt. Normal bowel sounds heard. Central nervous system: Alert and oriented. No focal neurological deficits. Extremities: Symmetric 5 x 5 power. Skin: No rashes, lesions or ulcers.  Psychiatry: Judgement and insight appear normal. Mood & affect appropriate.   Data Reviewed: I have personally reviewed following labs and imaging studies  CBC: Recent Labs  Lab 06/04/21 1620 06/05/21 0414 06/06/21 0446 06/08/21 0208  WBC 15.0* 13.0* 12.0* 7.8  NEUTROABS  --   --  10.2* 6.2  HGB 10.2* 9.0* 9.0* 8.4*  HCT 30.4* 27.4* 26.5* 26.2*  MCV  89.4 90.7 88.6 91.3  PLT 306 249 246 412    Basic Metabolic Panel: Recent Labs  Lab 06/05/21 0414 06/06/21 0446 06/07/21 0635 06/08/21 0208 06/09/21 0148  NA 140 130* 136 132* 136  K 3.8 3.6 4.0 3.9 3.9  CL 113* 104 106 107 108  CO2 20* 18* 17* 15* 15*  GLUCOSE 128* 146* 110* 142* 104*  BUN 52* 62* 75* 82* 88*  CREATININE 3.74* 4.35* 4.87* 5.07* 4.96*  CALCIUM 8.0* 7.9* 8.4* 8.1* 8.3*  MG  --  1.7  --  2.0  --     GFR: Estimated Creatinine Clearance: 20 mL/min (A) (by C-G formula based on SCr of 4.96 mg/dL (H)). Liver Function Tests: Recent Labs  Lab 06/05/21 0414  AST 14*  ALT 13  ALKPHOS 61  BILITOT 0.7  PROT 6.1*  ALBUMIN 2.7*    No results for input(s): LIPASE, AMYLASE in the last 168 hours. No results for input(s): AMMONIA in the last 168 hours. Coagulation Profile: No results for input(s): INR, PROTIME in the last 168 hours. Cardiac Enzymes: No results for input(s): CKTOTAL, CKMB, CKMBINDEX, TROPONINI in the last 168 hours. BNP (last 3  results) No results for input(s): PROBNP in the last 8760 hours. HbA1C: No results for input(s): HGBA1C in the last 72 hours.  CBG: Recent Labs  Lab 06/08/21 0536 06/08/21 1151 06/08/21 1654 06/08/21 2142 06/09/21 0629  GLUCAP 120* 135* 149* 128* 107*    Lipid Profile: No results for input(s): CHOL, HDL, LDLCALC, TRIG, CHOLHDL, LDLDIRECT in the last 72 hours. Thyroid Function Tests: No results for input(s): TSH, T4TOTAL, FREET4, T3FREE, THYROIDAB in the last 72 hours. Anemia Panel: Recent Labs    06/09/21 0934  FERRITIN 295  TIBC 190*  IRON 15*   Sepsis Labs: Recent Labs  Lab 06/05/21 0414 06/08/21 0208  PROCALCITON 1.43 1.46     Recent Results (from the past 240 hour(s))  Blood culture (routine x 2)     Status: None   Collection Time: 06/04/21  6:17 PM   Specimen: BLOOD LEFT ARM  Result Value Ref Range Status   Specimen Description BLOOD LEFT ARM  Final   Special Requests   Final    BOTTLES DRAWN AEROBIC AND ANAEROBIC Blood Culture results may not be optimal due to an inadequate volume of blood received in culture bottles   Culture   Final    NO GROWTH 5 DAYS Performed at [facility], [place] [address] [place], [place] [address]    Report Status 06/09/2021 FINAL  Final  Resp Panel by RT-PCR (Flu A&B, Covid) Peripheral     Status: None   Collection Time: 06/04/21  7:08 PM   Specimen: Peripheral; Nasopharyngeal(NP) swabs in vial transport medium  Result Value Ref Range Status   SARS Coronavirus 2 by RT PCR NEGATIVE NEGATIVE Final    Comment: (NOTE) SARS-CoV-2 target nucleic acids are NOT DETECTED.  The SARS-CoV-2 RNA is generally detectable in upper respiratory specimens during the acute phase of infection. The lowest concentration of SARS-CoV-2 viral copies this assay can detect is 138 copies/mL. A negative result does not preclude SARS-Cov-2 infection and should not be used as the sole basis for treatment or other patient management decisions. A  negative result may occur with  improper specimen collection/handling, submission of specimen other than nasopharyngeal swab, presence of viral mutation(s) within the areas targeted by this assay, and inadequate number of viral copies(<138 copies/mL). A negative result must be combined with clinical observations,  patient history, and epidemiological information. The expected result is Negative.  Fact Sheet for Patients:  EntrepreneurPulse.com.au  Fact Sheet for Healthcare Providers:  IncredibleEmployment.be  This test is no t yet approved or cleared by the [place] FDA and  has been authorized for detection and/or diagnosis of SARS-CoV-2 by FDA under an Emergency Use Authorization (EUA). This EUA will remain  in effect (meaning this test can be used) for the duration of the COVID-19 declaration under Section 564(b)(1) of the Act, 21 U.S.C.section 360bbb-3(b)(1), unless the authorization is terminated  or revoked sooner.       Influenza A by PCR NEGATIVE NEGATIVE Final   Influenza B by PCR NEGATIVE NEGATIVE Final    Comment: (NOTE) The Xpert Xpress SARS-CoV-2/FLU/RSV plus assay is intended as an aid in the diagnosis of influenza from Nasopharyngeal swab specimens and should not be used as a sole basis for treatment. Nasal washings and aspirates are unacceptable for Xpert Xpress SARS-CoV-2/FLU/RSV testing.  Fact Sheet for Patients: EntrepreneurPulse.com.au  Fact Sheet for Healthcare Providers: IncredibleEmployment.be  This test is not yet approved or cleared by the [place] FDA and has been authorized for detection and/or diagnosis of SARS-CoV-2 by FDA under an Emergency Use Authorization (EUA). This EUA will remain in effect (meaning this test can be used) for the duration of the COVID-19 declaration under Section 564(b)(1) of the Act, 21 U.S.C. section 360bbb-3(b)(1), unless the authorization  is terminated or revoked.  Performed at [facility], [place] [address] [place], [place] [address]   Blood culture (routine x 2)     Status: None   Collection Time: 06/04/21  7:19 PM   Specimen: BLOOD RIGHT ARM  Result Value Ref Range Status   Specimen Description BLOOD RIGHT ARM  Final   Special Requests   Final    BOTTLES DRAWN AEROBIC AND ANAEROBIC Blood Culture adequate volume   Culture   Final    NO GROWTH 5 DAYS Performed at [facility], [place] [address], [place], [place] [address]    Report Status 06/09/2021 FINAL  Final      Radiology Studies: No results found.  Scheduled Meds:  amLODipine  10 mg Oral Daily   aspirin EC  81 mg Oral Daily   atorvastatin  40 mg Oral Daily   Chlorhexidine Gluconate Cloth  6 each Topical Q0600   enoxaparin (LOVENOX) injection  30 mg Subcutaneous Q24H   escitalopram  20 mg Oral Daily   finasteride  5 mg Oral Daily   furosemide  80 mg Intravenous TID   gabapentin  200 mg Oral Daily   hydrALAZINE  100 mg Oral TID   insulin aspart  0-15 Units Subcutaneous TID WC   insulin aspart  0-5 Units Subcutaneous QHS   metoprolol succinate  50 mg Oral Daily   nortriptyline  25 mg Oral QHS   nystatin  5 mL Oral QID   pantoprazole  40 mg Oral Daily   sodium bicarbonate  650 mg Oral BID   sodium chloride flush  3 mL Intravenous Q12H   Continuous Infusions:  sodium chloride Stopped (06/08/21 1930)     LOS: 4 days   Darliss Cheney, MD [facility]  06/09/2021, 11:31 AM   *Please note that this is a verbal dictation therefore any spelling or grammatical errors are due to the "[place] [employer]" system interpretation.  Please page via [place] and do not message via secure chat for urgent patient care matters. Secure chat can be  used for non urgent patient care matters.  How to contact the [facility] Attending or Consulting provider [place] or covering provider during after hours [place], for this patient?  Check the care team in [facility] and  look for a) attending/consulting TR[facility] provider listed and b) the [facility] team listed. Page or secure chat [phone]. Log into [email or web address] and use [facility]'s universal password to access. If you do not have the password, please contact the hospital operator. Locate the [facility] provider you are looking for under [facility] and page to a number that you can be directly reached. If you still have difficulty reaching the provider, please page the [facility] (Director on Call) for the Hospitalists listed on amion for assistance.

## 2021-06-10 DIAGNOSIS — J189 Pneumonia, unspecified organism: Secondary | ICD-10-CM | POA: Diagnosis not present

## 2021-06-10 LAB — RENAL FUNCTION PANEL
Albumin: 2.4 g/dL — ABNORMAL LOW (ref 3.5–5.0)
Anion gap: 11 (ref 5–15)
BUN: 87 mg/dL — ABNORMAL HIGH (ref 6–20)
CO2: 17 mmol/L — ABNORMAL LOW (ref 22–32)
Calcium: 8.3 mg/dL — ABNORMAL LOW (ref 8.9–10.3)
Chloride: 105 mmol/L (ref 98–111)
Creatinine, Ser: 4.73 mg/dL — ABNORMAL HIGH (ref 0.61–1.24)
GFR, Estimated: 13 mL/min — ABNORMAL LOW (ref >60)
Glucose, Bld: 110 mg/dL — ABNORMAL HIGH (ref 70–99)
Phosphorus: 5.1 mg/dL — ABNORMAL HIGH (ref 2.5–4.6)
Potassium: 3.7 mmol/L (ref 3.5–5.1)
Sodium: 133 mmol/L — ABNORMAL LOW (ref 135–145)

## 2021-06-10 LAB — GLUCOSE, CAPILLARY
Glucose-Capillary: 118 mg/dL — ABNORMAL HIGH (ref 70–99)
Glucose-Capillary: 102 mg/dL — ABNORMAL HIGH (ref 70–99)
Glucose-Capillary: 158 mg/dL — ABNORMAL HIGH (ref 70–99)
Glucose-Capillary: 163 mg/dL — ABNORMAL HIGH (ref 70–99)
Glucose-Capillary: 106 mg/dL — ABNORMAL HIGH (ref 70–99)

## 2021-06-10 NOTE — Progress Notes (Signed)
Patient ID: Ryan Jenkins, male   DOB: 03-Aug-1960, 61 y.o.   MRN: [id number] [facility] Progress Note   Assessment/ Plan:   1. Acute kidney Injury on chronic kidney disease stage IIIb (baseline creatinine around 2.7).  Underlying chronic kidney disease suspected to be from hypertension and diabetes and acute kidney injury.  Etiology of AKI suspected to be ATN from pneumonia/sepsis versus cardiorenal in the setting of decompensated diastolic heart failure.  Improving urine output overnight with slightly better BUN and creatinine which may be an indication of early signs of renal recovery.  Mild hyponatremia noted and anticipate will improve with diuretics, begin fluid restriction. 2.  Community-acquired pneumonia/sepsis: With acute hypoxic respiratory failure earlier and improving with antimicrobial therapy-ceftriaxone and azithromycin. 3.  Acute exacerbation of diastolic heart failure: Ongoing intermittent furosemide doses to try and improve oxygenation. 4.  Metabolic acidosis: Non-anion gap and likely from progressive chronic kidney disease, continue oral sodium bicarbonate at low-dose to limit sodium loading. 5.  Anemia: Without overt blood loss, likely secondary to chronic kidney disease and exacerbated by acute illness.  Check iron studies and begin ESA.  Subjective:   Denies any complaints at this time, attempted to ambulate with walker yesterday "it was hard".   Objective:   BP (!) 150/76 (BP Location: Left Arm)   Pulse 82   Temp 98.5 F (36.9 C) (Oral)   Resp 19   Ht '6\' 2"'$  (1.88 m)   Wt 97.7 kg   SpO2 99%   BMI 27.64 kg/m   Intake/Output Summary (Last 24 hours) at 06/10/2021 0850 Last data filed at 06/10/2021 0500 Gross per 24 hour  Intake 1047.86 ml  Output 2530 ml  Net -1482.14 ml   Weight change: 0.589 kg  Physical Exam: Gen: Appears comfortable resting in bed, no apparent distress CVS: Pulse regular rhythm, normal rate, S1 and S2 normal Resp:  Anteriorly clear to auscultation, no rales/rhonchi Abd: Soft, obese, nontender, bowel sounds normal Ext: Trace lower extremity edema  Imaging: No results found.  Labs: BMET Recent Labs  Lab 06/04/21 1620 06/05/21 0414 06/06/21 0446 06/07/21 0635 06/08/21 0208 06/09/21 0148 06/10/21 0137  NA 139 140 130* 136 132* 136 133*  K 3.8 3.8 3.6 4.0 3.9 3.9 3.7  CL 109 113* 104 106 107 108 105  CO2 19* 20* 18* 17* 15* 15* 17*  GLUCOSE 169* 128* 146* 110* 142* 104* 110*  BUN 47* 52* 62* 75* 82* 88* 87*  CREATININE 3.43* 3.74* 4.35* 4.87* 5.07* 4.96* 4.73*  CALCIUM 8.5* 8.0* 7.9* 8.4* 8.1* 8.3* 8.3*  PHOS  --   --   --   --   --   --  5.1*   CBC Recent Labs  Lab 06/04/21 1620 06/05/21 0414 06/06/21 0446 06/08/21 0208  WBC 15.0* 13.0* 12.0* 7.8  NEUTROABS  --   --  10.2* 6.2  HGB 10.2* 9.0* 9.0* 8.4*  HCT 30.4* 27.4* 26.5* 26.2*  MCV 89.4 90.7 88.6 91.3  PLT 306 249 246 270    Medications:     amLODipine  10 mg Oral Daily   aspirin EC  81 mg Oral Daily   atorvastatin  40 mg Oral Daily   Chlorhexidine Gluconate Cloth  6 each Topical Q0600   enoxaparin (LOVENOX) injection  30 mg Subcutaneous Q24H   escitalopram  20 mg Oral Daily   finasteride  5 mg Oral Daily   furosemide  80 mg Intravenous TID   gabapentin  200 mg Oral Daily  hydrALAZINE  100 mg Oral TID   insulin aspart  0-15 Units Subcutaneous TID WC   insulin aspart  0-5 Units Subcutaneous QHS   metoprolol succinate  50 mg Oral Daily   nortriptyline  25 mg Oral QHS   nystatin  5 mL Oral QID   pantoprazole  40 mg Oral Daily   sodium bicarbonate  650 mg Oral BID   sodium chloride flush  3 mL Intravenous Q12H   Elmarie Shiley, MD 06/10/2021, 8:50 AM

## 2021-06-10 NOTE — Progress Notes (Signed)
Mobility Specialist: Progress Note   06/10/21 1116  Mobility  Activity Ambulated with assistance in hallway  Level of Assistance Contact guard assist, steadying assist  Assistive Device Four wheel walker  Distance Ambulated (ft) 160 ft  Activity Response Tolerated well  $Mobility charge 1 Mobility   Pre-Mobility: 82 HR, 100% SpO2 Post-Mobility: 80 HR, 100% SpO2  Pt received in the bed and agreeable to mobility. C/o BLE pain during session, no rating given. Endorsed mild dizziness upon standing, resolved with distance. Stopped x3 for standing breaks secondary to fatigue. Pt back to bed per request with call bell and phone in reach. Bed alarm is on.   [facility] Afifa Truax Mobility Specialist Mobility Specialist 5 North: [phone] Mobility Specialist 6 North: [phone]

## 2021-06-10 NOTE — Progress Notes (Signed)
PROGRESS NOTE    Ryan Jenkins  HKV:[id number] DOB: 13-Mar-1960 DOA: 06/04/2021 PCP: Armanda Heritage, NP   Brief Narrative:  Ryan Jenkins is a 61 y.o. male with medical history significant of diabetes with neuropathy and gastroparesis, hypertension, CKD 4, depression, anxiety, hyperlipidemia, GERD, CHF, CAD, anemia presented with multiple complaints including shortness of breath and chest pain and was admitted with acute hypoxic respiratory failure and severe sepsis secondary to pneumonia.  Assessment & Plan:   Principal Problem:   Pneumonia Active Problems:   Type 2 diabetes mellitus with peripheral neuropathy (HCC)   Essential hypertension   Depression   Hyperlipidemia   Gastroesophageal reflux disease without esophagitis   CKD (chronic kidney disease), stage III (HCC)   Acute respiratory failure with hypoxia (HCC)   Acute renal failure superimposed on stage 4 chronic kidney disease (HCC)   Coronary artery disease   Severe sepsis (HCC)  Severe sepsis and acute hypoxic respiratory failure secondary to community-acquired pneumonia: Patient meets severe sepsis criteria based on temperature of 100.6, tachycardia, tachypnea and leukocytosis of 15 as well as acute hypoxia.  No lactic acid drawn yesterday.  Urine streptococcal antigen negative.  Legionella pending.  Sputum culture pending.  Leukocytosis improving, last low-grade temperature spike on 06/05/2021 at 1:28 PM.  Procalcitonin 1.43.  Blood culture negative.  Leukocytosis improving.  He is afebrile now.  He completed 5 days of Rocephin and Zithromax on 06/09/2021.  We will repeat procalcitonin.  He is saturating 100% on room air.  Mild metabolic acidosis and AKI on CKD stage IV: Patient's baseline creatinine is 2.7 with GFR of 26, presented with 3.43 which worsened And peaked at 5.07, gradually improving, down to 4.7 today.  CO2 also improving, he remains on bicarb tablets.  He has been started on aggressive 3 times daily 80 mg  IV Lasix diuresis.  Management per nephrology.  Chest pain: Patient also complained of chest pain, mostly after coughing.  He also tells me that he had herpes zoster at the right anterior chest going all the way under the right axilla 3 months ago and he continues to have pain there despite of being on gabapentin.  Troponin only slightly elevated on almost flat.  Echo once again showed 60 to 65% ejection fraction with grade 2 diastolic dysfunction and no wall motion abnormality.  Acute on chronic chronic diastolic congestive heart failure: Patient had elevated BNP but initial chest x-ray negative for pulm edema or vascular congestion.  On 06/05/2021 around noon, patient had worsening shortness of breath and he had crackles on exam, chest x-ray was done and at that point in time this showed vascular congestion/pulmonary edema.  We gave him 1 dose of Lasix, he felt better.  He was given another dose of Lasix by nephrology on 06/06/2021.  He has been started on aggressive Lasix dosage by nephrology.  Essential hypertension: Blood pressure controlled.  Continue home medications which includes amlodipine, finasteride, hydralazine and metoprolol.  Oral candidiasis: Continue nystatin swish and swallow.  Hyperlipidemia: Continue statin.  Diabetes mellitus type 2: Blood sugar controlled on SSI.  GERD: Continue PPI.  Neuropathy: Continue home nortriptyline and gabapentin.  Chronic mild depression: Continue Lexapro.  Generalized weakness: Seen by PT OT, they recommended SNF however patient desires to go home with home health.  DVT prophylaxis: enoxaparin (LOVENOX) injection 30 mg Start: 06/04/21 1915   Code Status: Full Code  Family Communication:  None present at bedside.  Plan of care discussed with patient in length and he/she  verbalized understanding and agreed with it.  Status is: Inpatient Remains inpatient appropriate because: Patient is still very sick with rising creatinine.   Estimated  body mass index is 27.64 kg/m as calculated from the following:   Height as of this encounter: '6\' 2"'$  (1.88 m).   Weight as of this encounter: 97.7 kg.    Nutritional Assessment: Body mass index is 27.64 kg/m.[name] Seen by dietician.  I agree with the assessment and plan as outlined below: Nutrition Status:        . Skin Assessment: I have examined the patient's skin and I agree with the wound assessment as performed by the wound care RN as outlined below:    Consultants:  Nephrology  Procedures:  None  Antimicrobials:  Anti-infectives (From admission, onward)    Start     Dose/Rate Route Frequency Ordered Stop   06/06/21 1800  azithromycin (ZITHROMAX) tablet 500 mg        500 mg Oral Daily 06/06/21 1114 06/08/21 1652   06/05/21 1800  cefTRIAXone (ROCEPHIN) 2 g in sodium chloride 0.9 % 100 mL IVPB        2 g 200 mL/hr over 30 Minutes Intravenous Every 24 hours 06/04/21 1910 06/08/21 1726   06/05/21 1800  azithromycin (ZITHROMAX) 500 mg in sodium chloride 0.9 % 250 mL IVPB  Status:  Discontinued        500 mg 250 mL/hr over 60 Minutes Intravenous Every 24 hours 06/04/21 1910 06/06/21 1114   06/04/21 1815  cefTRIAXone (ROCEPHIN) 2 g in sodium chloride 0.9 % 100 mL IVPB        2 g 200 mL/hr over 30 Minutes Intravenous  Once 06/04/21 1812 06/04/21 2200   06/04/21 1815  azithromycin (ZITHROMAX) 500 mg in sodium chloride 0.9 % 250 mL IVPB        500 mg 250 mL/hr over 60 Minutes Intravenous  Once 06/04/21 1812 06/04/21 2214         Subjective:  Patient seen and examined.  He states that he feels better than yesterday, weakness is improving as well.  This morning when I talked to him, he seems to be on the same page to go to SNF once medically stable but later on, he change his mind and when discussed with TOC, he has now decided to go home with home health when medically stable.  Objective: Vitals:   06/09/21 2347 06/10/21 0405 06/10/21 0908 06/10/21 1200  BP: (!) 142/72  (!) 150/76 (!) 166/77 (!) 156/74  Pulse: 78 82 80   Resp: '14 19 16   '$ Temp: 98.2 F (36.8 C) 98.5 F (36.9 C) 97.7 F (36.5 C) 98.8 F (37.1 C)  TempSrc: Oral Oral Oral Oral  SpO2: 99% 99% 100%   Weight:  97.7 kg    Height:        Intake/Output Summary (Last 24 hours) at 06/10/2021 1324 Last data filed at 06/10/2021 0500 Gross per 24 hour  Intake 240 ml  Output 2530 ml  Net -2290 ml    Filed Weights   06/09/21 0456 06/09/21 1205 06/10/21 0405  Weight: 100.2 kg 100.8 kg 97.7 kg    Examination:  General exam: Appears calm and comfortable  Respiratory system: Clear to auscultation. Respiratory effort normal. Cardiovascular system: S1 & S2 heard, RRR. No JVD, murmurs, rubs, gallops or clicks. No pedal edema. Gastrointestinal system: Abdomen is nondistended, soft and nontender. No organomegaly or masses felt. Normal bowel sounds heard. Central nervous system: Alert and oriented. No  focal neurological deficits. Extremities: Symmetric 5 x 5 power. Skin: No rashes, lesions or ulcers.  Psychiatry: Judgement and insight appear normal. Mood & affect appropriate.   Data Reviewed: I have personally reviewed following labs and imaging studies  CBC: Recent Labs  Lab 06/04/21 1620 06/05/21 0414 06/06/21 0446 06/08/21 0208  WBC 15.0* 13.0* 12.0* 7.8  NEUTROABS  --   --  10.2* 6.2  HGB 10.2* 9.0* 9.0* 8.4*  HCT 30.4* 27.4* 26.5* 26.2*  MCV 89.4 90.7 88.6 91.3  PLT 306 249 246 009    Basic Metabolic Panel: Recent Labs  Lab 06/06/21 0446 06/07/21 0635 06/08/21 0208 06/09/21 0148 06/10/21 0137  NA 130* 136 132* 136 133*  K 3.6 4.0 3.9 3.9 3.7  CL 104 106 107 108 105  CO2 18* 17* 15* 15* 17*  GLUCOSE 146* 110* 142* 104* 110*  BUN 62* 75* 82* 88* 87*  CREATININE 4.35* 4.87* 5.07* 4.96* 4.73*  CALCIUM 7.9* 8.4* 8.1* 8.3* 8.3*  MG 1.7  --  2.0  --   --   PHOS  --   --   --   --  5.1*    GFR: Estimated Creatinine Clearance: 19.3 mL/min (A) (by C-G formula based on SCr  of 4.73 mg/dL (H)). Liver Function Tests: Recent Labs  Lab 06/05/21 0414 06/10/21 0137  AST 14*  --   ALT 13  --   ALKPHOS 61  --   BILITOT 0.7  --   PROT 6.1*  --   ALBUMIN 2.7* 2.4*    No results for input(s): LIPASE, AMYLASE in the last 168 hours. No results for input(s): AMMONIA in the last 168 hours. Coagulation Profile: No results for input(s): INR, PROTIME in the last 168 hours. Cardiac Enzymes: No results for input(s): CKTOTAL, CKMB, CKMBINDEX, TROPONINI in the last 168 hours. BNP (last 3 results) No results for input(s): PROBNP in the last 8760 hours. HbA1C: No results for input(s): HGBA1C in the last 72 hours.  CBG: Recent Labs  Lab 06/09/21 1200 06/09/21 1611 06/09/21 2116 06/10/21 0632 06/10/21 1153  GLUCAP 137* 126* 118* 102* 158*    Lipid Profile: No results for input(s): CHOL, HDL, LDLCALC, TRIG, CHOLHDL, LDLDIRECT in the last 72 hours. Thyroid Function Tests: No results for input(s): TSH, T4TOTAL, FREET4, T3FREE, THYROIDAB in the last 72 hours. Anemia Panel: Recent Labs    06/09/21 0934  FERRITIN 295  TIBC 190*  IRON 15*    Sepsis Labs: Recent Labs  Lab 06/05/21 0414 06/08/21 0208  PROCALCITON 1.43 1.46     Recent Results (from the past 240 hour(s))  Blood culture (routine x 2)     Status: None   Collection Time: 06/04/21  6:17 PM   Specimen: BLOOD LEFT ARM  Result Value Ref Range Status   Specimen Description BLOOD LEFT ARM  Final   Special Requests   Final    BOTTLES DRAWN AEROBIC AND ANAEROBIC Blood Culture results may not be optimal due to an inadequate volume of blood received in culture bottles   Culture   Final    NO GROWTH 5 DAYS Performed at [facility], [place] [address], [place], [place] [address]    Report Status 06/09/2021 FINAL  Final  Resp Panel by RT-PCR (Flu A&B, Covid) Peripheral     Status: None   Collection Time: 06/04/21  7:08 PM   Specimen: Peripheral; Nasopharyngeal(NP) swabs in vial transport medium   Result Value Ref Range Status   SARS Coronavirus 2  by RT PCR NEGATIVE NEGATIVE Final    Comment: (NOTE) SARS-CoV-2 target nucleic acids are NOT DETECTED.  The SARS-CoV-2 RNA is generally detectable in upper respiratory specimens during the acute phase of infection. The lowest concentration of SARS-CoV-2 viral copies this assay can detect is 138 copies/mL. A negative result does not preclude SARS-Cov-2 infection and should not be used as the sole basis for treatment or other patient management decisions. A negative result may occur with  improper specimen collection/handling, submission of specimen other than nasopharyngeal swab, presence of viral mutation(s) within the areas targeted by this assay, and inadequate number of viral copies(<138 copies/mL). A negative result must be combined with clinical observations, patient history, and epidemiological information. The expected result is Negative.  Fact Sheet for Patients:  EntrepreneurPulse.com.au  Fact Sheet for Healthcare Providers:  IncredibleEmployment.be  This test is no t yet approved or cleared by the [place] FDA and  has been authorized for detection and/or diagnosis of SARS-CoV-2 by FDA under an Emergency Use Authorization (EUA). This EUA will remain  in effect (meaning this test can be used) for the duration of the COVID-19 declaration under Section 564(b)(1) of the Act, 21 U.S.C.section 360bbb-3(b)(1), unless the authorization is terminated  or revoked sooner.       Influenza A by PCR NEGATIVE NEGATIVE Final   Influenza B by PCR NEGATIVE NEGATIVE Final    Comment: (NOTE) The Xpert Xpress SARS-CoV-2/FLU/RSV plus assay is intended as an aid in the diagnosis of influenza from Nasopharyngeal swab specimens and should not be used as a sole basis for treatment. Nasal washings and aspirates are unacceptable for Xpert Xpress SARS-CoV-2/FLU/RSV testing.  Fact Sheet for  Patients: EntrepreneurPulse.com.au  Fact Sheet for Healthcare Providers: IncredibleEmployment.be  This test is not yet approved or cleared by the [place] FDA and has been authorized for detection and/or diagnosis of SARS-CoV-2 by FDA under an Emergency Use Authorization (EUA). This EUA will remain in effect (meaning this test can be used) for the duration of the COVID-19 declaration under Section 564(b)(1) of the Act, 21 U.S.C. section 360bbb-3(b)(1), unless the authorization is terminated or revoked.  Performed at [facility], [place] [address] [place], [place] [address]   Blood culture (routine x 2)     Status: None   Collection Time: 06/04/21  7:19 PM   Specimen: BLOOD RIGHT ARM  Result Value Ref Range Status   Specimen Description BLOOD RIGHT ARM  Final   Special Requests   Final    BOTTLES DRAWN AEROBIC AND ANAEROBIC Blood Culture adequate volume   Culture   Final    NO GROWTH 5 DAYS Performed at [facility], [place] [address] [place], [place] [address]    Report Status 06/09/2021 FINAL  Final      Radiology Studies: No results found.  Scheduled Meds:  amLODipine  10 mg Oral Daily   aspirin EC  81 mg Oral Daily   atorvastatin  40 mg Oral Daily   Chlorhexidine Gluconate Cloth  6 each Topical Q0600   enoxaparin (LOVENOX) injection  30 mg Subcutaneous Q24H   escitalopram  20 mg Oral Daily   finasteride  5 mg Oral Daily   furosemide  80 mg Intravenous TID   gabapentin  200 mg Oral Daily   hydrALAZINE  100 mg Oral TID   insulin aspart  0-15 Units Subcutaneous TID WC   insulin aspart  0-5 Units Subcutaneous QHS   metoprolol succinate  50 mg Oral Daily  nortriptyline  25 mg Oral QHS   nystatin  5 mL Oral QID   pantoprazole  40 mg Oral Daily   sodium bicarbonate  650 mg Oral BID   sodium chloride flush  3 mL Intravenous Q12H   Continuous Infusions:     LOS: 5 days   Darliss Cheney, MD [facility]  06/10/2021, 1:24 PM   *Please note that this is a verbal dictation therefore any spelling or grammatical errors are due to the "[place] One" system interpretation.  Please page via [place] and do not message via secure chat for urgent patient care matters. Secure chat can be used for non urgent patient care matters.  How to contact the [facility] Attending or Consulting provider Ewa Gentry or covering provider during after hours [place], for this patient?  Check the care team in [facility] and look for a) attending/consulting TRH provider listed and b) the [facility] team listed. Page or secure chat [phone]. Log into [email or web address] and use [place]'s universal password to access. If you do not have the password, please contact the hospital operator. Locate the [facility] provider you are looking for under [facility] and page to a number that you can be directly reached. If you still have difficulty reaching the provider, please page the [facility] (Director on Call) for the Hospitalists listed on amion for assistance.

## 2021-06-10 NOTE — TOC Initial Note (Signed)
Transition of Care [facility]) - Initial/Assessment Note    Patient Details  Name: Ryan Jenkins MRN: [id number] Date of Birth: 05-15-1960  [facility] CM/SW Contact:    Bary Castilla, LCSW Phone Number:[phone] 06/10/2021, 1:20 PM  Clinical Narrative:        CSW spoke with pt via phone to discuss the recommendations for PT and OT. CSW asked pt would he prefer to go home with [facility] or [place] rehab.CSW explained the differences in services. Pt informed CSW that he is doing better and would like to go home with [facility].     TOC team will continue to assist with discharge planning needs.            Expected Discharge Plan: [place] Barriers to Discharge: Continued Medical Work up   Patient Goals and CMS Choice        Expected Discharge Plan and Services Expected Discharge Plan: [place]                                              Prior Living Arrangements/Services   Lives with:: Relatives                   Activities of Daily Living Home Assistive Devices/Equipment: CBG Meter, Environmental consultant (specify type) ADL Screening (condition at time of admission) Patient's cognitive ability adequate to safely complete daily activities?: Yes Is the patient deaf or have difficulty hearing?: No Does the patient have difficulty seeing, even when wearing glasses/contacts?: No Does the patient have difficulty concentrating, remembering, or making decisions?: No Patient able to express need for assistance with ADLs?: Yes Does the patient have difficulty dressing or bathing?: No Independently performs ADLs?: Yes (appropriate for developmental age) Does the patient have difficulty walking or climbing stairs?: Yes Weakness of Legs: Both Weakness of Arms/Hands: Both  Permission Sought/Granted      Share Information with NAME: Braulio Conte  Permission granted to share info w AGENCY: [place] granted to share info w Relationship:  brother  Permission granted to share info w Contact Information: [phone]  Emotional Assessment Appearance:: Other (Comment Required (completed by phone)   Affect (typically observed): Unable to Assess Orientation: : Oriented to Self, Oriented to Situation, Oriented to  Time, Oriented to Place      Admission diagnosis:  Pneumonia [J18.9] Community acquired pneumonia of right upper lobe of lung [J18.9] Acute respiratory failure with hypoxia ([place]) [J96.01] Patient Active Problem List   Diagnosis Date Noted   Severe sepsis ([place]) 06/05/2021   Pneumonia 06/04/2021   Coronary artery disease 02/07/2021   Acute renal failure superimposed on stage 4 chronic kidney disease (HCC)    Acute respiratory failure with hypoxia ([place]) 02/01/2021   Acute on chronic congestive heart failure ([place]) 01/31/2021   Acute CHF (congestive heart failure) ([place]) 08/03/2020   CKD (chronic kidney disease) stage 4, GFR 15-29 ml/min ([place]) 07/07/2020   Chest pain 07/07/2020   Acute on chronic diastolic CHF (congestive heart failure) ([place]) 07/07/2020   Erectile dysfunction associated with type 2 diabetes mellitus ([place]) 08/07/2019   Hives 08/07/2019   Vitreous hemorrhage of left eye ([place]) 07/22/2019   Elevated troponin 07/22/2019   Vision loss of left eye 07/22/2019   History of medication noncompliance 04/02/2019   Gastric ulcer without hemorrhage or perforation  12/25/2018   CKD (chronic kidney disease), stage III ([place]) 12/25/2018   Gastroesophageal reflux disease without esophagitis 09/04/2018   Diabetic retinopathy of both eyes associated with type 2 diabetes mellitus ([place]) 01/03/2018   Intermittent diarrhea 09/27/2017   Gastroparesis 09/27/2017   Macroalbuminuric diabetic nephropathy ([place]) 06/25/2017   History of falling 06/25/2017   Hyperlipidemia 03/21/2017   Vitamin B 12 deficiency 03/21/2017   Type 2 diabetes mellitus with peripheral neuropathy ([place]) 02/05/2017   Essential hypertension 02/05/2017    Depression 02/05/2017   Unintended weight loss 02/05/2017   Gait disturbance 02/05/2017   Pronation deformity of both feet 05/11/2014   Diabetic neuropathy, type II diabetes mellitus ([place]) 05/11/2014   Metatarsal deformity 05/11/2014   PCP:  Armanda Heritage, NP Pharmacy:   [facility] [phone] - [place], [place] [address] [place] [address] Phone: [phone] Fax: [phone]  [id number] [place] [id number] [id number]: [phone]: [phone]    Social Determinants of Health (SDOH) Interventions    Readmission Risk Interventions     View : No data to display.

## 2021-06-11 DIAGNOSIS — J189 Pneumonia, unspecified organism: Secondary | ICD-10-CM | POA: Diagnosis not present

## 2021-06-11 LAB — RENAL FUNCTION PANEL
Albumin: 2.7 g/dL — ABNORMAL LOW (ref 3.5–5.0)
Anion gap: 12 (ref 5–15)
BUN: 81 mg/dL — ABNORMAL HIGH (ref 6–20)
CO2: 20 mmol/L — ABNORMAL LOW (ref 22–32)
Calcium: 8.7 mg/dL — ABNORMAL LOW (ref 8.9–10.3)
Chloride: 106 mmol/L (ref 98–111)
Creatinine, Ser: 4.53 mg/dL — ABNORMAL HIGH (ref 0.61–1.24)
GFR, Estimated: 14 mL/min — ABNORMAL LOW (ref >60)
Glucose, Bld: 111 mg/dL — ABNORMAL HIGH (ref 70–99)
Phosphorus: 4.7 mg/dL — ABNORMAL HIGH (ref 2.5–4.6)
Potassium: 3.9 mmol/L (ref 3.5–5.1)
Sodium: 138 mmol/L (ref 135–145)

## 2021-06-11 LAB — GLUCOSE, CAPILLARY
Glucose-Capillary: 118 mg/dL — ABNORMAL HIGH (ref 70–99)
Glucose-Capillary: 189 mg/dL — ABNORMAL HIGH (ref 70–99)
Glucose-Capillary: 146 mg/dL — ABNORMAL HIGH (ref 70–99)
Glucose-Capillary: 179 mg/dL — ABNORMAL HIGH (ref 70–99)

## 2021-06-11 MED ORDER — FUROSEMIDE 40 MG PO TABS
40.0000 mg | ORAL_TABLET | Freq: Two times a day (BID) | ORAL | Status: DC
  Administered 2021-06-11 – 2021-06-13 (×4): 40 mg via ORAL
  Filled 2021-06-11 (×4): qty 1

## 2021-06-11 MED ORDER — HYDROCODONE-ACETAMINOPHEN 5-325 MG PO TABS
1.0000 | ORAL_TABLET | Freq: Four times a day (QID) | ORAL | Status: DC | PRN
  Administered 2021-06-11 – 2021-06-13 (×6): 1 via ORAL
  Filled 2021-06-11 (×6): qty 1

## 2021-06-11 MED ORDER — FUROSEMIDE 40 MG PO TABS: 40.0000 mg | ORAL_TABLET | Freq: Two times a day (BID) | ORAL | Status: DC

## 2021-06-11 NOTE — Progress Notes (Signed)
Mobility Specialist: Progress Note   06/11/21 1159  Mobility  Activity Ambulated with assistance in hallway  Level of Assistance Contact guard assist, steadying assist  Assistive Device Four wheel walker  Distance Ambulated (ft) 280 ft  Activity Response Tolerated well  $Mobility charge 1 Mobility   Pre-Mobility: 76 HR, 135/73 (91) BP Post-Mobility: 78 HR, 130/73 (90) BP, 99% SpO2  Pt received in the bed and agreeable to mobility. C/o RLE pain he rated 4/10 today requiring several standing breaks throughout. Pt assisted to the chair after session. Call bell and phone in reach. Pt's sister present in the room.   [facility] Michaeljohn Biss Mobility Specialist Mobility Specialist 5 North: [phone] Mobility Specialist 6 North: [phone]

## 2021-06-11 NOTE — Progress Notes (Signed)
Patient ID: Ryan Jenkins, male   DOB: 1960-04-14, 61 y.o.   MRN: [id number] [facility] Progress Note   Assessment/ Plan:   1. Acute kidney Injury on chronic kidney disease stage IIIb (baseline creatinine around 2.7).  Underlying chronic kidney disease suspected to be from hypertension and diabetes and acute kidney injury. AKI suspected to be ATN from pneumonia/sepsis versus cardiorenal in the setting of decompensated diastolic heart failure.  Decent urine output overnight in response to furosemide with some improvement of BUN/creatinine reflective of ensuing renal recovery.  Will reduce diuretic dosing-convert to oral furosemide. 2.  Community-acquired pneumonia/sepsis: With acute hypoxic respiratory failure earlier and has made significant improvement status post completion of antimicrobial therapy with ceftriaxone and azithromycin. 3.  Acute exacerbation of diastolic heart failure: Continue furosemide for volume unloading-we will switch from intravenous to oral furosemide starting today. 4.  Metabolic acidosis: Non-anion gap and likely from progressive chronic kidney disease, improving with ongoing low-dose sodium bicarbonate and improving renal function. 5.  Anemia: Without overt blood loss, likely secondary to chronic kidney disease and exacerbated by acute illness.  Check iron studies and begin ESA.  Subjective:   Complains of occipital headache radiating down his neck not alleviated by Tylenol.  Denies any chest pain or shortness of breath.   Objective:   BP (!) 149/78 (BP Location: Left Arm)   Pulse 80   Temp 97.6 F (36.4 C) (Oral)   Resp 10   Ht '6\' 2"'$  (1.88 m)   Wt 95.3 kg   SpO2 100%   BMI 26.98 kg/m   Intake/Output Summary (Last 24 hours) at 06/11/2021 0836 Last data filed at 06/11/2021 0438 Gross per 24 hour  Intake --  Output 2600 ml  Net -2600 ml   Weight change: -5.489 kg  Physical Exam: Gen: Appears comfortable resting in bed, no apparent  distress CVS: Pulse regular rhythm, normal rate, S1 and S2 normal Resp: Anteriorly clear to auscultation, no rales/rhonchi Abd: Soft, obese, nontender, bowel sounds normal Ext: No lower extremity edema  Imaging: No results found.  Labs: BMET Recent Labs  Lab 06/05/21 0414 06/06/21 0446 06/07/21 0635 06/08/21 0208 06/09/21 0148 06/10/21 0137 06/11/21 0206  NA 140 130* 136 132* 136 133* 138  K 3.8 3.6 4.0 3.9 3.9 3.7 3.9  CL 113* 104 106 107 108 105 106  CO2 20* 18* 17* 15* 15* 17* 20*  GLUCOSE 128* 146* 110* 142* 104* 110* 111*  BUN 52* 62* 75* 82* 88* 87* 81*  CREATININE 3.74* 4.35* 4.87* 5.07* 4.96* 4.73* 4.53*  CALCIUM 8.0* 7.9* 8.4* 8.1* 8.3* 8.3* 8.7*  PHOS  --   --   --   --   --  5.1* 4.7*   CBC Recent Labs  Lab 06/04/21 1620 06/05/21 0414 06/06/21 0446 06/08/21 0208  WBC 15.0* 13.0* 12.0* 7.8  NEUTROABS  --   --  10.2* 6.2  HGB 10.2* 9.0* 9.0* 8.4*  HCT 30.4* 27.4* 26.5* 26.2*  MCV 89.4 90.7 88.6 91.3  PLT 306 249 246 270    Medications:     amLODipine  10 mg Oral Daily   aspirin EC  81 mg Oral Daily   atorvastatin  40 mg Oral Daily   Chlorhexidine Gluconate Cloth  6 each Topical Q0600   enoxaparin (LOVENOX) injection  30 mg Subcutaneous Q24H   escitalopram  20 mg Oral Daily   finasteride  5 mg Oral Daily   gabapentin  200 mg Oral Daily   hydrALAZINE  100 mg Oral TID   insulin aspart  0-15 Units Subcutaneous TID WC   insulin aspart  0-5 Units Subcutaneous QHS   metoprolol succinate  50 mg Oral Daily   nortriptyline  25 mg Oral QHS   nystatin  5 mL Oral QID   pantoprazole  40 mg Oral Daily   sodium bicarbonate  650 mg Oral BID   sodium chloride flush  3 mL Intravenous Q12H   Elmarie Shiley, MD 06/11/2021, 8:36 AM

## 2021-06-11 NOTE — Progress Notes (Signed)
PROGRESS NOTE    [name]  XKG:[id number] DOB: 1960-02-21 DOA: 06/04/2021 PCP: Armanda Heritage, NP   Brief Narrative:  Ryan Jenkins is a 61 y.o. male with medical history significant of diabetes with neuropathy and gastroparesis, hypertension, CKD 4, depression, anxiety, hyperlipidemia, GERD, CHF, CAD, anemia presented with multiple complaints including shortness of breath and chest pain and was admitted with acute hypoxic respiratory failure and severe sepsis secondary to pneumonia.  Assessment & Plan:   Principal Problem:   Pneumonia Active Problems:   Type 2 diabetes mellitus with peripheral neuropathy (HCC)   Essential hypertension   Depression   Hyperlipidemia   Gastroesophageal reflux disease without esophagitis   CKD (chronic kidney disease), stage III (HCC)   Acute respiratory failure with hypoxia (HCC)   Acute renal failure superimposed on stage 4 chronic kidney disease (HCC)   Coronary artery disease   Severe sepsis (HCC)  Severe sepsis and acute hypoxic respiratory failure secondary to community-acquired pneumonia: Patient meets severe sepsis criteria based on temperature of 100.6, tachycardia, tachypnea and leukocytosis of 15 as well as acute hypoxia.  No lactic acid drawn yesterday.  Urine streptococcal antigen negative.  Legionella pending.  Sputum culture pending.  Leukocytosis improving, last low-grade temperature spike on 06/05/2021 at 1:28 PM.  Procalcitonin 1.43.  Blood culture negative.  Leukocytosis improving.  Ryan Jenkins is afebrile now.  Ryan Jenkins completed 5 days of Rocephin and Zithromax on 06/09/2021.  We will repeat procalcitonin.  Ryan Jenkins is saturating 100% on room air.  Mild metabolic acidosis and AKI on CKD stage IV: Patient's baseline creatinine is 2.7 with GFR of 26, presented with 3.43 which worsened And peaked at 5.07, gradually improving, down to 4.53 today.  CO2 also improving, Ryan Jenkins remains on bicarb tablets.  IV Lasix switched to oral Lasix by nephrology.   Management per nephrology.  Chest pain: Patient also complained of chest pain, mostly after coughing.  Ryan Jenkins also tells me that Ryan Jenkins had herpes zoster at the right anterior chest going all the way under the right axilla 3 months ago and Ryan Jenkins continues to have pain there despite of being on gabapentin.  Troponin only slightly elevated on almost flat.  Echo once again showed 60 to 65% ejection fraction with grade 2 diastolic dysfunction and no wall motion abnormality.  Acute on chronic chronic diastolic congestive heart failure: Patient had elevated BNP but initial chest x-ray negative for pulm edema or vascular congestion.  On 06/05/2021 around noon, patient had worsening shortness of breath and Ryan Jenkins had crackles on exam, chest x-ray was done and at that point in time this showed vascular congestion/pulmonary edema.  We gave him 1 dose of Lasix, Ryan Jenkins felt better.  Ryan Jenkins was given another dose of Lasix by nephrology on 06/06/2021 and was continued on IV Lasix.  Lasix transition from IV to oral on 06/11/2021.  Essential hypertension: Blood pressure controlled.  Continue home medications which includes amlodipine, finasteride, hydralazine and metoprolol.  Oral candidiasis: Continue nystatin swish and swallow.  Hyperlipidemia: Continue statin.  Diabetes mellitus type 2: Blood sugar controlled on SSI.  GERD: Continue PPI.  Neuropathy: Continue home nortriptyline and gabapentin.  Chronic mild depression: Continue Lexapro.  Generalized weakness: Seen by PT OT, they recommended SNF however patient desires to go home with home health.  DVT prophylaxis: enoxaparin (LOVENOX) injection 30 mg Start: 06/04/21 1915   Code Status: Full Code  Family Communication:  None present at bedside.  Plan of care discussed with patient in length and Ryan Jenkins/she verbalized understanding  and agreed with it.  Status is: Inpatient Remains inpatient appropriate because: Still with elevated creatinine which is improving slowly.   Estimated  body mass index is 26.98 kg/m as calculated from the following:   Height as of this encounter: '6\' 2"'$  (1.88 m).   Weight as of this encounter: 95.3 kg.    Nutritional Assessment: Body mass index is 26.98 kg/m.[name] Seen by dietician.  I agree with the assessment and plan as outlined below: Nutrition Status:        . Skin Assessment: I have examined the patient's skin and I agree with the wound assessment as performed by the wound care RN as outlined below:    Consultants:  Nephrology  Procedures:  None  Antimicrobials:  Anti-infectives (From admission, onward)    Start     Dose/Rate Route Frequency Ordered Stop   06/06/21 1800  azithromycin (ZITHROMAX) tablet 500 mg        500 mg Oral Daily 06/06/21 1114 06/08/21 1652   06/05/21 1800  cefTRIAXone (ROCEPHIN) 2 g in sodium chloride 0.9 % 100 mL IVPB        2 g 200 mL/hr over 30 Minutes Intravenous Every 24 hours 06/04/21 1910 06/08/21 1726   06/05/21 1800  azithromycin (ZITHROMAX) 500 mg in sodium chloride 0.9 % 250 mL IVPB  Status:  Discontinued        500 mg 250 mL/hr over 60 Minutes Intravenous Every 24 hours 06/04/21 1910 06/06/21 1114   06/04/21 1815  cefTRIAXone (ROCEPHIN) 2 g in sodium chloride 0.9 % 100 mL IVPB        2 g 200 mL/hr over 30 Minutes Intravenous  Once 06/04/21 1812 06/04/21 2200   06/04/21 1815  azithromycin (ZITHROMAX) 500 mg in sodium chloride 0.9 % 250 mL IVPB        500 mg 250 mL/hr over 60 Minutes Intravenous  Once 06/04/21 1812 06/04/21 2214         Subjective:  Seen and examined.  Ryan Jenkins states that Ryan Jenkins is getting better slowly, weakness is improving, Ryan Jenkins reiterated that Ryan Jenkins would prefer to go home with home health instead of SNF.  Objective: Vitals:   06/11/21 0004 06/11/21 0434 06/11/21 0649 06/11/21 0756  BP: (!) 146/72 139/66  (!) 149/78  Pulse: 72 77  80  Resp: '16 13  10  '$ Temp: 98.7 F (37.1 C) 97.9 F (36.6 C)  97.6 F (36.4 C)  TempSrc: Oral Oral  Oral  SpO2: 100% 100%  100%   Weight:   95.3 kg   Height:        Intake/Output Summary (Last 24 hours) at 06/11/2021 1005 Last data filed at 06/11/2021 0438 Gross per 24 hour  Intake --  Output 2600 ml  Net -2600 ml    Filed Weights   06/09/21 1205 06/10/21 0405 06/11/21 0649  Weight: 100.8 kg 97.7 kg 95.3 kg    Examination:  General exam: Appears calm and comfortable  Respiratory system: Clear to auscultation. Respiratory effort normal. Cardiovascular system: S1 & S2 heard, RRR. No JVD, murmurs, rubs, gallops or clicks. No pedal edema. Gastrointestinal system: Abdomen is nondistended, soft and nontender. No organomegaly or masses felt. Normal bowel sounds heard. Central nervous system: Alert and oriented. No focal neurological deficits. Extremities: Symmetric 5 x 5 power. Skin: No rashes, lesions or ulcers.  Psychiatry: Judgement and insight appear normal. Mood & affect appropriate.     Data Reviewed: I have personally reviewed following labs and imaging studies  CBC: Recent  Labs  Lab 06/04/21 1620 06/05/21 0414 06/06/21 0446 06/08/21 0208  WBC 15.0* 13.0* 12.0* 7.8  NEUTROABS  --   --  10.2* 6.2  HGB 10.2* 9.0* 9.0* 8.4*  HCT 30.4* 27.4* 26.5* 26.2*  MCV 89.4 90.7 88.6 91.3  PLT 306 249 246 563    Basic Metabolic Panel: Recent Labs  Lab 06/06/21 0446 06/07/21 0635 06/08/21 0208 06/09/21 0148 06/10/21 0137 06/11/21 0206  NA 130* 136 132* 136 133* 138  K 3.6 4.0 3.9 3.9 3.7 3.9  CL 104 106 107 108 105 106  CO2 18* 17* 15* 15* 17* 20*  GLUCOSE 146* 110* 142* 104* 110* 111*  BUN 62* 75* 82* 88* 87* 81*  CREATININE 4.35* 4.87* 5.07* 4.96* 4.73* 4.53*  CALCIUM 7.9* 8.4* 8.1* 8.3* 8.3* 8.7*  MG 1.7  --  2.0  --   --   --   PHOS  --   --   --   --  5.1* 4.7*    GFR: Estimated Creatinine Clearance: 20.2 mL/min (A) (by C-G formula based on SCr of 4.53 mg/dL (H)). Liver Function Tests: Recent Labs  Lab 06/05/21 0414 06/10/21 0137 06/11/21 0206  AST 14*  --   --   ALT 13  --   --    ALKPHOS 61  --   --   BILITOT 0.7  --   --   PROT 6.1*  --   --   ALBUMIN 2.7* 2.4* 2.7*    No results for input(s): LIPASE, AMYLASE in the last 168 hours. No results for input(s): AMMONIA in the last 168 hours. Coagulation Profile: No results for input(s): INR, PROTIME in the last 168 hours. Cardiac Enzymes: No results for input(s): CKTOTAL, CKMB, CKMBINDEX, TROPONINI in the last 168 hours. BNP (last 3 results) No results for input(s): PROBNP in the last 8760 hours. HbA1C: No results for input(s): HGBA1C in the last 72 hours.  CBG: Recent Labs  Lab 06/10/21 0632 06/10/21 1153 06/10/21 1631 06/10/21 2127 06/11/21 0625  GLUCAP 102* 158* 163* 106* 118*    Lipid Profile: No results for input(s): CHOL, HDL, LDLCALC, TRIG, CHOLHDL, LDLDIRECT in the last 72 hours. Thyroid Function Tests: No results for input(s): TSH, T4TOTAL, FREET4, T3FREE, THYROIDAB in the last 72 hours. Anemia Panel: Recent Labs    06/09/21 0934  FERRITIN 295  TIBC 190*  IRON 15*    Sepsis Labs: Recent Labs  Lab 06/05/21 0414 06/08/21 0208  PROCALCITON 1.43 1.46     Recent Results (from the past 240 hour(s))  Blood culture (routine x 2)     Status: None   Collection Time: 06/04/21  6:17 PM   Specimen: BLOOD LEFT ARM  Result Value Ref Range Status   Specimen Description BLOOD LEFT ARM  Final   Special Requests   Final    BOTTLES DRAWN AEROBIC AND ANAEROBIC Blood Culture results may not be optimal due to an inadequate volume of blood received in culture bottles   Culture   Final    NO GROWTH 5 DAYS Performed at [facility], [place] [address] [place], [place] [address]    Report Status 06/09/2021 FINAL  Final  Resp Panel by RT-PCR (Flu A&B, Covid) Peripheral     Status: None   Collection Time: 06/04/21  7:08 PM   Specimen: Peripheral; Nasopharyngeal(NP) swabs in vial transport medium  Result Value Ref Range Status   SARS Coronavirus 2 by RT PCR NEGATIVE NEGATIVE Final    Comment:  (  NOTE) SARS-CoV-2 target nucleic acids are NOT DETECTED.  The SARS-CoV-2 RNA is generally detectable in upper respiratory specimens during the acute phase of infection. The lowest concentration of SARS-CoV-2 viral copies this assay can detect is 138 copies/mL. A negative result does not preclude SARS-Cov-2 infection and should not be used as the sole basis for treatment or other patient management decisions. A negative result may occur with  improper specimen collection/handling, submission of specimen other than nasopharyngeal swab, presence of viral mutation(s) within the areas targeted by this assay, and inadequate number of viral copies(<138 copies/mL). A negative result must be combined with clinical observations, patient history, and epidemiological information. The expected result is Negative.  Fact Sheet for Patients:  EntrepreneurPulse.com.au  Fact Sheet for Healthcare Providers:  IncredibleEmployment.be  This test is no t yet approved or cleared by the [place] FDA and  has been authorized for detection and/or diagnosis of SARS-CoV-2 by FDA under an Emergency Use Authorization (EUA). This EUA will remain  in effect (meaning this test can be used) for the duration of the COVID-19 declaration under Section 564(b)(1) of the Act, 21 U.S.C.section 360bbb-3(b)(1), unless the authorization is terminated  or revoked sooner.       Influenza A by PCR NEGATIVE NEGATIVE Final   Influenza B by PCR NEGATIVE NEGATIVE Final    Comment: (NOTE) The Xpert Xpress SARS-CoV-2/FLU/RSV plus assay is intended as an aid in the diagnosis of influenza from Nasopharyngeal swab specimens and should not be used as a sole basis for treatment. Nasal washings and aspirates are unacceptable for Xpert Xpress SARS-CoV-2/FLU/RSV testing.  Fact Sheet for Patients: EntrepreneurPulse.com.au  Fact Sheet for Healthcare  Providers: IncredibleEmployment.be  This test is not yet approved or cleared by the [place] FDA and has been authorized for detection and/or diagnosis of SARS-CoV-2 by FDA under an Emergency Use Authorization (EUA). This EUA will remain in effect (meaning this test can be used) for the duration of the COVID-19 declaration under Section 564(b)(1) of the Act, 21 U.S.C. section 360bbb-3(b)(1), unless the authorization is terminated or revoked.  Performed at [facility], [place] [address], [place], [place] [address]   Blood culture (routine x 2)     Status: None   Collection Time: 06/04/21  7:19 PM   Specimen: BLOOD RIGHT ARM  Result Value Ref Range Status   Specimen Description BLOOD RIGHT ARM  Final   Special Requests   Final    BOTTLES DRAWN AEROBIC AND ANAEROBIC Blood Culture adequate volume   Culture   Final    NO GROWTH 5 DAYS Performed at [facility], [place] [address] [place], [place] [address]    Report Status 06/09/2021 FINAL  Final      Radiology Studies: No results found.  Scheduled Meds:  amLODipine  10 mg Oral Daily   aspirin EC  81 mg Oral Daily   atorvastatin  40 mg Oral Daily   Chlorhexidine Gluconate Cloth  6 each Topical Q0600   enoxaparin (LOVENOX) injection  30 mg Subcutaneous Q24H   escitalopram  20 mg Oral Daily   finasteride  5 mg Oral Daily   furosemide  40 mg Oral BID   gabapentin  200 mg Oral Daily   hydrALAZINE  100 mg Oral TID   insulin aspart  0-15 Units Subcutaneous TID WC   insulin aspart  0-5 Units Subcutaneous QHS   metoprolol succinate  50 mg Oral Daily   nortriptyline  25 mg Oral QHS  nystatin  5 mL Oral QID   pantoprazole  40 mg Oral Daily   sodium bicarbonate  650 mg Oral BID   sodium chloride flush  3 mL Intravenous Q12H   Continuous Infusions:     LOS: 6 days   Darliss Cheney, MD [facility]  06/11/2021, 10:05 AM   *Please note that this is a verbal dictation therefore any spelling  or grammatical errors are due to the "[place] [employer]" system interpretation.  Please page via [place] and do not message via secure chat for urgent patient care matters. Secure chat can be used for non urgent patient care matters.  How to contact the [facility] Attending or Consulting provider [place] or covering provider during after hours [place], for this patient?  Check the care team in [facility] and look for a) attending/consulting TR[facility] provider listed and b) the [facility] team listed. Page or secure chat [phone]. Log into [email or web address] and use [place]'s universal password to access. If you do not have the password, please contact the hospital operator. Locate the [facility] provider you are looking for under [facility] and page to a number that you can be directly reached. If you still have difficulty reaching the provider, please page the [facility] (Director on Call) for the Hospitalists listed on amion for assistance.

## 2021-06-12 DIAGNOSIS — J189 Pneumonia, unspecified organism: Secondary | ICD-10-CM | POA: Diagnosis not present

## 2021-06-12 LAB — RENAL FUNCTION PANEL
Albumin: 2.5 g/dL — ABNORMAL LOW (ref 3.5–5.0)
Anion gap: 11 (ref 5–15)
BUN: 75 mg/dL — ABNORMAL HIGH (ref 6–20)
CO2: 21 mmol/L — ABNORMAL LOW (ref 22–32)
Calcium: 8.4 mg/dL — ABNORMAL LOW (ref 8.9–10.3)
Chloride: 102 mmol/L (ref 98–111)
Creatinine, Ser: 4.16 mg/dL — ABNORMAL HIGH (ref 0.61–1.24)
GFR, Estimated: 16 mL/min — ABNORMAL LOW (ref >60)
Glucose, Bld: 130 mg/dL — ABNORMAL HIGH (ref 70–99)
Phosphorus: 4.8 mg/dL — ABNORMAL HIGH (ref 2.5–4.6)
Potassium: 3.8 mmol/L (ref 3.5–5.1)
Sodium: 134 mmol/L — ABNORMAL LOW (ref 135–145)

## 2021-06-12 LAB — GLUCOSE, CAPILLARY
Glucose-Capillary: 136 mg/dL — ABNORMAL HIGH (ref 70–99)
Glucose-Capillary: 167 mg/dL — ABNORMAL HIGH (ref 70–99)
Glucose-Capillary: 110 mg/dL — ABNORMAL HIGH (ref 70–99)
Glucose-Capillary: 139 mg/dL — ABNORMAL HIGH (ref 70–99)

## 2021-06-12 MED ORDER — SODIUM CHLORIDE 0.9 % IV SOLN
250.0000 mg | Freq: Every day | INTRAVENOUS | Status: DC
  Administered 2021-06-12 – 2021-06-13 (×2): 250 mg via INTRAVENOUS
  Filled 2021-06-12 (×2): qty 20

## 2021-06-12 NOTE — Progress Notes (Signed)
Patient ID: Ryan Jenkins, male   DOB: [birth year], 61 y.o.   MRN: [id number] [facility] Progress Note   Assessment/ Plan:   1. Acute kidney Injury on chronic kidney disease stage IIIb (baseline creatinine around 2.7).  Underlying chronic kidney disease suspected to be from hypertension and diabetes and acute kidney injury. AKI suspected to be ATN from pneumonia/sepsis versus cardiorenal in the setting of decompensated diastolic heart failure. -diuretics changes to PO 5/28, continues to be nonoliguric, Cr improving slowly -nothing else to add from a nephrology perspective at this time. Will sign off and arrange for outpatient follow up. Please call with any questions/concerns. 2.  Community-acquired pneumonia/sepsis: With acute hypoxic respiratory failure earlier and has made significant improvement status post completion of antimicrobial therapy with ceftriaxone and azithromycin. 3.  Acute exacerbation of diastolic heart failure: Continue furosemide for volume unloading-now on PO. Volume status reasonable 4.  Metabolic acidosis: Non-anion gap and likely from progressive chronic kidney disease, improving with ongoing low-dose sodium bicarbonate and improving renal function. 5.  Anemia: Without overt blood loss, likely secondary to chronic kidney disease and exacerbated by acute illness.  Iron studies: low iron, will initiate repletion with IV iron-1g load. Transfuse prn for hgb <7  Subjective:   Has ongoing issues of neck pain, inquiring about muscle relaxer which he will discuss w/ hospitalist. Otherwise no other complaints.   Objective:   BP (!) 156/78 (BP Location: Left Arm)   Pulse 76   Temp 98.2 F (36.8 C) (Oral)   Resp 16   Ht '6\' 2"'$  (1.88 m)   Wt 95.3 kg   SpO2 95%   BMI 26.98 kg/m   Intake/Output Summary (Last 24 hours) at 06/12/2021 1005 Last data filed at 06/12/2021 0900 Gross per 24 hour  Intake 240 ml  Output 1475 ml  Net -1235 ml   Weight change:    Physical Exam: Gen: nad CVS: RRR, S1S2 Resp: CTA BL Abd: Soft, obese, nontender, bowel sounds normal Ext: No lower extremity edema  Imaging: No results found.  Labs: BMET Recent Labs  Lab 06/06/21 0446 06/07/21 4098 06/08/21 0208 06/09/21 0148 06/10/21 0137 06/11/21 0206 06/12/21 0155  NA 130* 136 132* 136 133* 138 134*  K 3.6 4.0 3.9 3.9 3.7 3.9 3.8  CL 104 106 107 108 105 106 102  CO2 18* 17* 15* 15* 17* 20* 21*  GLUCOSE 146* 110* 142* 104* 110* 111* 130*  BUN 62* 75* 82* 88* 87* 81* 75*  CREATININE 4.35* 4.87* 5.07* 4.96* 4.73* 4.53* 4.16*  CALCIUM 7.9* 8.4* 8.1* 8.3* 8.3* 8.7* 8.4*  PHOS  --   --   --   --  5.1* 4.7* 4.8*   CBC Recent Labs  Lab 06/06/21 0446 06/08/21 0208  WBC 12.0* 7.8  NEUTROABS 10.2* 6.2  HGB 9.0* 8.4*  HCT 26.5* 26.2*  MCV 88.6 91.3  PLT 246 270    Medications:     amLODipine  10 mg Oral Daily   aspirin EC  81 mg Oral Daily   atorvastatin  40 mg Oral Daily   Chlorhexidine Gluconate Cloth  6 each Topical Q0600   enoxaparin (LOVENOX) injection  30 mg Subcutaneous Q24H   escitalopram  20 mg Oral Daily   finasteride  5 mg Oral Daily   furosemide  40 mg Oral BID   gabapentin  200 mg Oral Daily   hydrALAZINE  100 mg Oral TID   insulin aspart  0-15 Units Subcutaneous TID WC   insulin  aspart  0-5 Units Subcutaneous QHS   metoprolol succinate  50 mg Oral Daily   nortriptyline  25 mg Oral QHS   nystatin  5 mL Oral QID   pantoprazole  40 mg Oral Daily   sodium bicarbonate  650 mg Oral BID   sodium chloride flush  3 mL Intravenous Q12H   Gean Quint, MD [facility] 06/12/2021, 10:05 AM

## 2021-06-12 NOTE — Progress Notes (Signed)
Physical Therapy Treatment Patient Details Name: Ryan Jenkins MRN: [id number] DOB: May 07, 1960 Today's Date: 06/12/2021   History of Present Illness Patient is a 61 yo male presenting to the ED with chest pain, mild SOB, nausea, and vomiting x 12 hours on 06/04/21. Admitted with acute hypoxic respiratory failure and severe sepsis secondary to pneumonia same day. PMH includes: diabetes with neuropathy and gastroparesis, hypertension, CKD 4, depression, anxiety, hyperlipidemia, GERD, CHF, CAD, anemia    PT Comments    Patient progressing slowly towards PT goals. Session focused on functional mobility- sit to stands and there ex for cervical spine/scapula due to pain in neck. Limited cervical AROM in all directions due to pain. Encouraged movement and heat to improve discomfort and performed MET as well. Worked on standing from different bed heights for LE strengthening. Pt declined transfer to chair and walking due to pain. Discharge recommendation updated to home with HHPT as pt declining SNF. Will follow.   Recommendations for follow up therapy are one component of a multi-disciplinary discharge planning process, led by the attending physician.  Recommendations may be updated based on patient status, additional functional criteria and insurance authorization.  Follow Up Recommendations  Home health PT     Assistance Recommended at Discharge Intermittent Supervision/Assistance  Patient can return home with the following A lot of help with walking and/or transfers;A lot of help with bathing/dressing/bathroom;Help with stairs or ramp for entrance;Assist for transportation   Equipment Recommendations  None recommended by PT    Recommendations for Other Services       Precautions / Restrictions Precautions Precautions: Fall Restrictions Weight Bearing Restrictions: No     Mobility  Bed Mobility Overal bed mobility: Needs Assistance Bed Mobility: Rolling, Sidelying to Sit Rolling:  Min guard Sidelying to sit: Min guard, HOB elevated       General bed mobility comments: Increased time, heavy use of rail to get to EOB and elevate trunk. favors left lateral lean.    Transfers Overall transfer level: Needs assistance Equipment used: Rolling walker (2 wheels) Transfers: Sit to/from Stand Sit to Stand: Min guard, Min assist           General transfer comment: Worked on standing from different bed heights progressively getting lower with cues for hand placement/technique, Min A needed at lowest height. Cues for foot placement. Declined transer to chair due to pain.    Ambulation/Gait Ambulation/Gait assistance: Min guard   Assistive device: Rolling walker (2 wheels) Gait Pattern/deviations: Trunk flexed       General Gait Details: Able to side step along side bed with use of RW for support.   Stairs             Wheelchair Mobility    Modified Rankin (Stroke Patients Only)       Balance Overall balance assessment: Mild deficits observed, not formally tested                                          Cognition Arousal/Alertness: Awake/alert Behavior During Therapy: Flat affect Overall Cognitive Status: Within Functional Limits for tasks assessed                                 General Comments: alert and oriented, flat affect, distracted by pain.        Exercises Other Exercises  Other Exercises: Performed MET on cervical spine in right/left rotation. Other Exercises: Scapular retraction x10 Other Exercises: Soft tissue mobilization right trap/SCM, applied heat.    General Comments General comments (skin integrity, edema, etc.): Brother present during session. VSS on RA.      Pertinent Vitals/Pain Pain Assessment Pain Assessment: Faces Faces Pain Scale: Hurts even more Pain Location: bilateral legs due to neuropathy, cervical spine/shoulders Pain Descriptors / Indicators: Discomfort, Grimacing,  Guarding, Sore Pain Intervention(s): Monitored during session, Repositioned, Premedicated before session, Limited activity within patient's tolerance    Home Living                          Prior Function            PT Goals (current goals can now be found in the care plan section) Progress towards PT goals: Progressing toward goals (slowly)    Frequency    Min 3X/week      PT Plan Discharge plan needs to be updated    Co-evaluation              AM-PAC PT "6 Clicks" Mobility   Outcome Measure  Help needed turning from your back to your side while in a flat bed without using bedrails?: A Little Help needed moving from lying on your back to sitting on the side of a flat bed without using bedrails?: A Little Help needed moving to and from a bed to a chair (including a wheelchair)?: A Little Help needed standing up from a chair using your arms (e.g., wheelchair or bedside chair)?: A Little Help needed to walk in hospital room?: A Lot Help needed climbing 3-5 steps with a railing? : A Lot 6 Click Score: 16    End of Session Equipment Utilized During Treatment: Gait belt Activity Tolerance: Patient limited by fatigue;Patient limited by pain Patient left: in bed;with call bell/phone within reach;with family/visitor present (sitting EOB eating breakfast) Nurse Communication: Mobility status PT Visit Diagnosis: Unsteadiness on feet (R26.81);Muscle weakness (generalized) (M62.81)     Time: 4034-7425 PT Time Calculation (min) (ACUTE ONLY): 30 min  Charges:  $Therapeutic Exercise: 8-22 mins $Therapeutic Activity: 8-22 mins                     Marisa Severin, PT, DPT Acute Rehabilitation Services Secure chat preferred Office [place] 06/12/2021, 9:59 AM

## 2021-06-12 NOTE — Progress Notes (Signed)
Mobility Specialist Progress Note    06/12/21 1525  Mobility  Activity Ambulated with assistance in hallway  Level of Assistance Minimal assist, patient does 75% or more  Assistive Device Front wheel walker  Distance Ambulated (ft) 30 ft  Activity Response Tolerated fair  $Mobility charge 1 Mobility   Post-Mobility: 65 HR  Pt received in chair and agreeable. C/o numbness and tingling feeling mainly in his L leg . Returned to bed with call bell in reach.    Hildred Alamin Mobility Specialist  Primary: 5N M.S. Phone: [phone] Secondary: 6N M.S. Phone: [phone]

## 2021-06-12 NOTE — Progress Notes (Signed)
PROGRESS NOTE    Ryan Jenkins  HBZ:[id number] DOB: September 09, 1960 DOA: 06/04/2021 PCP: Armanda Heritage, NP   Brief Narrative:  Ryan Jenkins is a 61 y.o. male with medical history significant of diabetes with neuropathy and gastroparesis, hypertension, CKD 4, depression, anxiety, hyperlipidemia, GERD, CHF, CAD, anemia presented with multiple complaints including shortness of breath and chest pain and was admitted with acute hypoxic respiratory failure and severe sepsis secondary to pneumonia.  Assessment & Plan:   Principal Problem:   Pneumonia Active Problems:   Type 2 diabetes mellitus with peripheral neuropathy (HCC)   Essential hypertension   Depression   Hyperlipidemia   Gastroesophageal reflux disease without esophagitis   CKD (chronic kidney disease), stage III (HCC)   Acute respiratory failure with hypoxia (HCC)   Acute renal failure superimposed on stage 4 chronic kidney disease (HCC)   Coronary artery disease   Severe sepsis (HCC)  Severe sepsis and acute hypoxic respiratory failure secondary to community-acquired pneumonia: Patient meets severe sepsis criteria based on temperature of 100.6, tachycardia, tachypnea and leukocytosis of 15 as well as acute hypoxia.  No lactic acid drawn yesterday.  Urine streptococcal antigen negative.  Legionella pending.  Sputum culture pending.  Leukocytosis improving, last low-grade temperature spike on 06/05/2021 at 1:28 PM.  Procalcitonin 1.43.  Blood culture negative.  Leukocytosis improving.  He is afebrile now.  He completed 5 days of Rocephin and Zithromax on 06/09/2021.  He is saturating 100% on room air.  Mild metabolic acidosis and AKI on CKD stage IV: Patient's baseline creatinine is 2.7 with GFR of 26, presented with 3.43 which worsened And peaked at 5.07, gradually improving, down to 4.16 today.  CO2 also improving, he remains on bicarb tablets.  IV Lasix switched to oral Lasix by nephrology on 06/11/2021.  Nephrology has cleared  him and have signed off however I believe it would be in patient's best interest to stay another night and to repeat BMP tomorrow to make sure renal function continues to improve.  Patient agreeable with that plan.  Chest pain: Patient also complained of chest pain, mostly after coughing.  He also tells me that he had herpes zoster at the right anterior chest going all the way under the right axilla 3 months ago and he continues to have pain there despite of being on gabapentin.  Troponin only slightly elevated on almost flat.  Echo once again showed 60 to 65% ejection fraction with grade 2 diastolic dysfunction and no wall motion abnormality.  Acute on chronic chronic diastolic congestive heart failure: Patient had elevated BNP but initial chest x-ray negative for pulm edema or vascular congestion.  On 06/05/2021 around noon, patient had worsening shortness of breath and he had crackles on exam, chest x-ray was done and at that point in time this showed vascular congestion/pulmonary edema.  We gave him 1 dose of Lasix, he felt better.  He was given another dose of Lasix by nephrology on 06/06/2021 and was continued on IV Lasix.  Lasix transition from IV to oral on 06/11/2021.  Essential hypertension: Blood pressure controlled.  Continue home medications which includes amlodipine, finasteride, hydralazine and metoprolol.  Oral candidiasis: Continue nystatin swish and swallow.  Hyperlipidemia: Continue statin.  Diabetes mellitus type 2: Blood sugar controlled on SSI.  GERD: Continue PPI.  Neuropathy: Continue home nortriptyline and gabapentin.  Chronic mild depression: Continue Lexapro.  Generalized weakness: Seen by PT OT, they recommended SNF however patient desires to go home with home health.  DVT prophylaxis:  enoxaparin (LOVENOX) injection 30 mg Start: 06/04/21 1915   Code Status: Full Code  Family Communication: Brother present at bedside.  Plan of care discussed with patient in length and  he/she verbalized understanding and agreed with it.  Status is: Inpatient Remains inpatient appropriate because: Still with elevated creatinine which is improving slowly.   Estimated body mass index is 26.98 kg/m as calculated from the following:   Height as of this encounter: '6\' 2"'$  (1.88 m).   Weight as of this encounter: 95.3 kg.    Nutritional Assessment: Body mass index is 26.98 kg/m.[name] Seen by dietician.  I agree with the assessment and plan as outlined below: Nutrition Status:        . Skin Assessment: I have examined the patient's skin and I agree with the wound assessment as performed by the wound care RN as outlined below:    Consultants:  Nephrology  Procedures:  None  Antimicrobials:  Anti-infectives (From admission, onward)    Start     Dose/Rate Route Frequency Ordered Stop   06/06/21 1800  azithromycin (ZITHROMAX) tablet 500 mg        500 mg Oral Daily 06/06/21 1114 06/08/21 1652   06/05/21 1800  cefTRIAXone (ROCEPHIN) 2 g in sodium chloride 0.9 % 100 mL IVPB        2 g 200 mL/hr over 30 Minutes Intravenous Every 24 hours 06/04/21 1910 06/08/21 1726   06/05/21 1800  azithromycin (ZITHROMAX) 500 mg in sodium chloride 0.9 % 250 mL IVPB  Status:  Discontinued        500 mg 250 mL/hr over 60 Minutes Intravenous Every 24 hours 06/04/21 1910 06/06/21 1114   06/04/21 1815  cefTRIAXone (ROCEPHIN) 2 g in sodium chloride 0.9 % 100 mL IVPB        2 g 200 mL/hr over 30 Minutes Intravenous  Once 06/04/21 1812 06/04/21 2200   06/04/21 1815  azithromycin (ZITHROMAX) 500 mg in sodium chloride 0.9 % 250 mL IVPB        500 mg 250 mL/hr over 60 Minutes Intravenous  Once 06/04/21 1812 06/04/21 2214         Subjective:  Seen and examined.  He states that he is improving.  PT was at the bedside.  Patient's brother was also at the bedside.  Patient has no other complaints.  He reiterated that he would like to go home with home health instead of  SNF.  Objective: Vitals:   06/11/21 2302 06/12/21 0346 06/12/21 0717 06/12/21 1140  BP: (!) 142/75 (!) 142/71 (!) 156/78 137/76  Pulse: 71 71 76 78  Resp: '14 13 16 14  '$ Temp: 98.4 F (36.9 C) 97.9 F (36.6 C) 98.2 F (36.8 C) 98.2 F (36.8 C)  TempSrc: Oral Oral Oral Oral  SpO2: 99% 98% 95% 96%  Weight:      Height:        Intake/Output Summary (Last 24 hours) at 06/12/2021 1225 Last data filed at 06/12/2021 0900 Gross per 24 hour  Intake 240 ml  Output 1475 ml  Net -1235 ml    Filed Weights   06/09/21 1205 06/10/21 0405 06/11/21 0649  Weight: 100.8 kg 97.7 kg 95.3 kg    Examination:  General exam: Appears calm and comfortable  Respiratory system: Clear to auscultation. Respiratory effort normal. Cardiovascular system: S1 & S2 heard, RRR. No JVD, murmurs, rubs, gallops or clicks. No pedal edema. Gastrointestinal system: Abdomen is nondistended, soft and nontender. No organomegaly or masses felt. Normal  bowel sounds heard. Central nervous system: Alert and oriented. No focal neurological deficits. Extremities: Symmetric 5 x 5 power. Skin: No rashes, lesions or ulcers.  Psychiatry: Judgement and insight appear normal. Mood & affect appropriate.   Data Reviewed: I have personally reviewed following labs and imaging studies  CBC: Recent Labs  Lab 06/06/21 0446 06/08/21 0208  WBC 12.0* 7.8  NEUTROABS 10.2* 6.2  HGB 9.0* 8.4*  HCT 26.5* 26.2*  MCV 88.6 91.3  PLT 246 952    Basic Metabolic Panel: Recent Labs  Lab 06/06/21 0446 06/07/21 0635 06/08/21 0208 06/09/21 0148 06/10/21 0137 06/11/21 0206 06/12/21 0155  NA 130*   < > 132* 136 133* 138 134*  K 3.6   < > 3.9 3.9 3.7 3.9 3.8  CL 104   < > 107 108 105 106 102  CO2 18*   < > 15* 15* 17* 20* 21*  GLUCOSE 146*   < > 142* 104* 110* 111* 130*  BUN 62*   < > 82* 88* 87* 81* 75*  CREATININE 4.35*   < > 5.07* 4.96* 4.73* 4.53* 4.16*  CALCIUM 7.9*   < > 8.1* 8.3* 8.3* 8.7* 8.4*  MG 1.7  --  2.0  --   --    --   --   PHOS  --   --   --   --  5.1* 4.7* 4.8*   < > = values in this interval not displayed.    GFR: Estimated Creatinine Clearance: 22 mL/min (A) (by C-G formula based on SCr of 4.16 mg/dL (H)). Liver Function Tests: Recent Labs  Lab 06/10/21 0137 06/11/21 0206 06/12/21 0155  ALBUMIN 2.4* 2.7* 2.5*    No results for input(s): LIPASE, AMYLASE in the last 168 hours. No results for input(s): AMMONIA in the last 168 hours. Coagulation Profile: No results for input(s): INR, PROTIME in the last 168 hours. Cardiac Enzymes: No results for input(s): CKTOTAL, CKMB, CKMBINDEX, TROPONINI in the last 168 hours. BNP (last 3 results) No results for input(s): PROBNP in the last 8760 hours. HbA1C: No results for input(s): HGBA1C in the last 72 hours.  CBG: Recent Labs  Lab 06/11/21 1119 06/11/21 1626 06/11/21 2116 06/12/21 0622 06/12/21 1143  GLUCAP 189* 146* 179* 136* 167*    Lipid Profile: No results for input(s): CHOL, HDL, LDLCALC, TRIG, CHOLHDL, LDLDIRECT in the last 72 hours. Thyroid Function Tests: No results for input(s): TSH, T4TOTAL, FREET4, T3FREE, THYROIDAB in the last 72 hours. Anemia Panel: No results for input(s): VITAMINB12, FOLATE, FERRITIN, TIBC, IRON, RETICCTPCT in the last 72 hours.  Sepsis Labs: Recent Labs  Lab 06/08/21 0208  PROCALCITON 1.46     Recent Results (from the past 240 hour(s))  Blood culture (routine x 2)     Status: None   Collection Time: 06/04/21  6:17 PM   Specimen: BLOOD LEFT ARM  Result Value Ref Range Status   Specimen Description BLOOD LEFT ARM  Final   Special Requests   Final    BOTTLES DRAWN AEROBIC AND ANAEROBIC Blood Culture results may not be optimal due to an inadequate volume of blood received in culture bottles   Culture   Final    NO GROWTH 5 DAYS Performed at [facility], [place] [address], [place], [place] [address]    Report Status 06/09/2021 FINAL  Final  Resp Panel by RT-PCR (Flu A&B, Covid) Peripheral      Status: None   Collection Time: 06/04/21  7:08 PM  Specimen: Peripheral; Nasopharyngeal(NP) swabs in vial transport medium  Result Value Ref Range Status   SARS Coronavirus 2 by RT PCR NEGATIVE NEGATIVE Final    Comment: (NOTE) SARS-CoV-2 target nucleic acids are NOT DETECTED.  The SARS-CoV-2 RNA is generally detectable in upper respiratory specimens during the acute phase of infection. The lowest concentration of SARS-CoV-2 viral copies this assay can detect is 138 copies/mL. A negative result does not preclude SARS-Cov-2 infection and should not be used as the sole basis for treatment or other patient management decisions. A negative result may occur with  improper specimen collection/handling, submission of specimen other than nasopharyngeal swab, presence of viral mutation(s) within the areas targeted by this assay, and inadequate number of viral copies(<138 copies/mL). A negative result must be combined with clinical observations, patient history, and epidemiological information. The expected result is Negative.  Fact Sheet for Patients:  EntrepreneurPulse.com.au  Fact Sheet for Healthcare Providers:  IncredibleEmployment.be  This test is no t yet approved or cleared by the [place] FDA and  has been authorized for detection and/or diagnosis of SARS-CoV-2 by FDA under an Emergency Use Authorization (EUA). This EUA will remain  in effect (meaning this test can be used) for the duration of the COVID-19 declaration under Section 564(b)(1) of the Act, 21 U.S.C.section 360bbb-3(b)(1), unless the authorization is terminated  or revoked sooner.       Influenza A by PCR NEGATIVE NEGATIVE Final   Influenza B by PCR NEGATIVE NEGATIVE Final    Comment: (NOTE) The Xpert Xpress SARS-CoV-2/FLU/RSV plus assay is intended as an aid in the diagnosis of influenza from Nasopharyngeal swab specimens and should not be used as a sole basis for  treatment. Nasal washings and aspirates are unacceptable for Xpert Xpress SARS-CoV-2/FLU/RSV testing.  Fact Sheet for Patients: EntrepreneurPulse.com.au  Fact Sheet for Healthcare Providers: IncredibleEmployment.be  This test is not yet approved or cleared by the [place] FDA and has been authorized for detection and/or diagnosis of SARS-CoV-2 by FDA under an Emergency Use Authorization (EUA). This EUA will remain in effect (meaning this test can be used) for the duration of the COVID-19 declaration under Section 564(b)(1) of the Act, 21 U.S.C. section 360bbb-3(b)(1), unless the authorization is terminated or revoked.  Performed at [facility], [place] [address], [place], [place] [address]   Blood culture (routine x 2)     Status: None   Collection Time: 06/04/21  7:19 PM   Specimen: BLOOD RIGHT ARM  Result Value Ref Range Status   Specimen Description BLOOD RIGHT ARM  Final   Special Requests   Final    BOTTLES DRAWN AEROBIC AND ANAEROBIC Blood Culture adequate volume   Culture   Final    NO GROWTH 5 DAYS Performed at [facility], [place] [address], [place], [place] [address]    Report Status 06/09/2021 FINAL  Final      Radiology Studies: No results found.  Scheduled Meds:  amLODipine  10 mg Oral Daily   aspirin EC  81 mg Oral Daily   atorvastatin  40 mg Oral Daily   Chlorhexidine Gluconate Cloth  6 each Topical Q0600   enoxaparin (LOVENOX) injection  30 mg Subcutaneous Q24H   escitalopram  20 mg Oral Daily   finasteride  5 mg Oral Daily   furosemide  40 mg Oral BID   gabapentin  200 mg Oral Daily   hydrALAZINE  100 mg Oral TID   insulin aspart  0-15 Units Subcutaneous TID  WC   insulin aspart  0-5 Units Subcutaneous QHS   metoprolol succinate  50 mg Oral Daily   nortriptyline  25 mg Oral QHS   nystatin  5 mL Oral QID   pantoprazole  40 mg Oral Daily   sodium bicarbonate  650 mg Oral BID   sodium chloride flush   3 mL Intravenous Q12H   Continuous Infusions:  ferric gluconate (FERRLECIT) IVPB        LOS: 7 days   Darliss Cheney, MD [facility]  06/12/2021, 12:25 PM   *Please note that this is a verbal dictation therefore any spelling or grammatical errors are due to the "[employer]" system interpretation.  Please page via [place] and do not message via secure chat for urgent patient care matters. Secure chat can be used for non urgent patient care matters.  How to contact the [facility] Attending or Consulting provider [place] or covering provider during after hours [place], for this patient?  Check the care team in [facility] and look for a) attending/consulting TR[facility] provider listed and b) the [facility] team listed. Page or secure chat [phone]. Log into [email or web address] and use [place] universal password to access. If you do not have the password, please contact the hospital operator. Locate the [facility] provider you are looking for under [facility] and page to a number that you can be directly reached. If you still have difficulty reaching the provider, please page the [facility] (Director on Call) for the Hospitalists listed on amion for assistance.

## 2021-06-12 NOTE — Progress Notes (Signed)
Occupational Therapy Treatment Patient Details Name: Ryan Jenkins MRN: [id number] DOB: November 29, 1960 Today's Date: 06/12/2021   History of present illness Patient is a 61 yo male presenting to the ED with chest pain, mild SOB, nausea, and vomiting x 12 hours on 06/04/21. Admitted with acute hypoxic respiratory failure and severe sepsis secondary to pneumonia same day. PMH includes: diabetes with neuropathy and gastroparesis, hypertension, CKD 4, depression, anxiety, hyperlipidemia, GERD, CHF, CAD, anemia   OT comments  Patient continues to make steady progress towards goals in skilled OT session. Patient's session encompassed  ADLs, functional mobility, and increasing activity tolerance. Patient set up for ADLs, and min A for transfers from elevated bed. Patient with need for increased assist to elevate trunk as patient demonstrated significant trunk flexion in standing. Sister arriving in session, with no questions with regard to education or for discharge home hopefully in a few days. OT continues to recommend [place] services at discharge; will continue to follow acutely.    Recommendations for follow up therapy are one component of a multi-disciplinary discharge planning process, led by the attending physician.  Recommendations may be updated based on patient status, additional functional criteria and insurance authorization.    Follow Up Recommendations  Home health OT    Assistance Recommended at Discharge Intermittent Supervision/Assistance  Patient can return home with the following  A little help with walking and/or transfers;A little help with bathing/dressing/bathroom;Assistance with cooking/housework;Direct supervision/assist for medications management;Direct supervision/assist for financial management;Help with stairs or ramp for entrance;Assist for transportation   Equipment Recommendations  None recommended by OT (patient has DME needed)    Recommendations for Other Services       Precautions / Restrictions Precautions Precautions: Fall Precaution Comments: watch O2 Restrictions Weight Bearing Restrictions: No       Mobility Bed Mobility               General bed mobility comments: sitting EOB upon arrival    Transfers Overall transfer level: Needs assistance Equipment used: Rolling walker (2 wheels) Transfers: Sit to/from Stand Sit to Stand: Min assist           General transfer comment: elevated surface to stand, patient requiring min A to elevate trunk as he demonstrated significant flexion at the trunk to stand     Balance                                           ADL either performed or assessed with clinical judgement   ADL Overall ADL's : Needs assistance/impaired     Grooming: Wash/dry hands;Wash/dry face;Sitting;Set up       Lower Body Bathing: Minimal assistance;Sit to/from stand;Sitting/lateral leans           Toilet Transfer: Minimal assistance;Ambulation;Rolling walker (2 wheels) Toilet Transfer Details (indicate cue type and reason): simulated with ambulation to recliner         Functional mobility during ADLs: Minimal assistance;Rolling walker (2 wheels) General ADL Comments: Patient continues to progress toward goals    Extremity/Trunk Assessment              Vision       Perception     Praxis      Cognition Arousal/Alertness: Awake/alert Behavior During Therapy: WFL for tasks assessed/performed Overall Cognitive Status: Within Functional Limits for tasks assessed  General Comments: motivated to move in session to the recliner, improved affect with sister present        Exercises      Shoulder Instructions       General Comments      Pertinent Vitals/ Pain       Pain Assessment Pain Assessment: Faces Faces Pain Scale: Hurts a little bit Pain Location: bilateral feet Pain Descriptors / Indicators: Discomfort,  Grimacing, Guarding, Sore Pain Intervention(s): Limited activity within patient's tolerance, Monitored during session, Repositioned  Home Living                                          Prior Functioning/Environment              Frequency  Min 2X/week        Progress Toward Goals  OT Goals(current goals can now be found in the care plan section)  Progress towards OT goals: Progressing toward goals  Acute Rehab OT Goals Patient Stated Goal: to get home OT Goal Formulation: With patient Time For Goal Achievement: 06/22/21 Potential to Achieve Goals: Good  Plan Discharge plan remains appropriate    Co-evaluation                 AM-PAC OT "6 Clicks" Daily Activity     Outcome Measure   Help from another person eating meals?: A Little Help from another person taking care of personal grooming?: A Little Help from another person toileting, which includes using toliet, bedpan, or urinal?: A Little Help from another person bathing (including washing, rinsing, drying)?: A Little Help from another person to put on and taking off regular upper body clothing?: A Little Help from another person to put on and taking off regular lower body clothing?: A Little 6 Click Score: 18    End of Session Equipment Utilized During Treatment: Gait belt;Rolling walker (2 wheels)  OT Visit Diagnosis: Unsteadiness on feet (R26.81);Other abnormalities of gait and mobility (R26.89);Muscle weakness (generalized) (M62.81);Pain Pain - Right/Left: Right Pain - part of body: Leg   Activity Tolerance Patient tolerated treatment well   Patient Left with call bell/phone within reach;with chair alarm set   Nurse Communication Mobility status        Time: 1422-1440 OT Time Calculation (min): 18 min  Charges: OT General Charges $OT Visit: 1 Visit OT Treatments $Self Care/Home Management : 8-22 mins  Corinne Ports E. Tokiko Diefenderfer, OTR/L Acute Rehabilitation  Services [phone] [place] 06/12/2021, 2:54 PM

## 2021-06-13 DIAGNOSIS — J189 Pneumonia, unspecified organism: Secondary | ICD-10-CM | POA: Diagnosis not present

## 2021-06-13 LAB — RENAL FUNCTION PANEL
Albumin: 2.6 g/dL — ABNORMAL LOW (ref 3.5–5.0)
Anion gap: 11 (ref 5–15)
BUN: 70 mg/dL — ABNORMAL HIGH (ref 6–20)
CO2: 20 mmol/L — ABNORMAL LOW (ref 22–32)
Calcium: 8.3 mg/dL — ABNORMAL LOW (ref 8.9–10.3)
Chloride: 105 mmol/L (ref 98–111)
Creatinine, Ser: 3.83 mg/dL — ABNORMAL HIGH (ref 0.61–1.24)
GFR, Estimated: 17 mL/min — ABNORMAL LOW (ref >60)
Glucose, Bld: 98 mg/dL (ref 70–99)
Phosphorus: 5 mg/dL — ABNORMAL HIGH (ref 2.5–4.6)
Potassium: 4.3 mmol/L (ref 3.5–5.1)
Sodium: 136 mmol/L (ref 135–145)

## 2021-06-13 LAB — GLUCOSE, CAPILLARY
Glucose-Capillary: 106 mg/dL — ABNORMAL HIGH (ref 70–99)
Glucose-Capillary: 156 mg/dL — ABNORMAL HIGH (ref 70–99)

## 2021-06-13 NOTE — Discharge Summary (Signed)
PatientPhysician Discharge Summary  BLISS TSANG PVV:[id number] DOB: May 27, 1960 DOA: 06/04/2021  PCP: Armanda Heritage, NP  Admit date: 06/04/2021 Discharge date: 06/13/2021 30 Day Unplanned Readmission Risk Score    Flowsheet Row ED to Hosp-Admission (Current) from 06/04/2021 in [facility] 4E CV SURGICAL PROGRESSIVE CARE  30 Day Unplanned Readmission Risk Score (%) 27.82 Filed at 06/13/2021 0801       This score is the patient's risk of an unplanned readmission within 30 days of being discharged (0 -100%). The score is based on dignosis, age, lab data, medications, orders, and past utilization.   Low:  0-14.9   Medium: 15-21.9   High: 22-29.9   Extreme: 30 and above          Admitted From: Home Disposition: Home  Recommendations for Outpatient Follow-up:  Follow up with PCP in 1-2 weeks Please obtain BMP/CBC in one week Follow-up with your nephrologist in 2 to 4 weeks Please follow up with your PCP on the following pending results: Unresulted Labs (From admission, onward)     Start     Ordered   06/10/21 0500  Renal function panel  Daily,   R     Question:  Specimen collection method  Answer:  Lab=Lab collect   06/09/21 0714              Home Health: Yes Equipment/Devices: None  Discharge Condition: Stable CODE STATUS: Full code Diet recommendation: Renal  Subjective: Seen and examined.  No complaints.  He is excited to go home today.  Brief/Interim Summary: MORITZ LEVER is a 61 y.o. male with medical history significant of diabetes with neuropathy and gastroparesis, hypertension, CKD 4, depression, anxiety, hyperlipidemia, GERD, CHF, CAD, anemia presented with multiple complaints including shortness of breath and chest pain and was admitted with acute hypoxic respiratory failure and severe sepsis secondary to pneumonia.  Detailed hospitalization as below.   Severe sepsis and acute hypoxic respiratory failure secondary to community-acquired pneumonia: Patient met  severe sepsis criteria based on temperature of 100.6, tachycardia, tachypnea and leukocytosis of 15 as well as acute hypoxia.  No lactic acid drawn yesterday.  Urine streptococcal as well as Legionella antigen negative.  last low-grade temperature spike on 06/05/2021 at 1:28 PM.  Procalcitonin 1.43.  Blood culture negative.  Leukocytosis resolved.  He completed 5 days of Rocephin and Zithromax on 06/09/2021.  He is saturating 100% on room air for the last 3 days.   Mild metabolic acidosis and AKI on CKD stage IV: Patient's baseline creatinine is 2.7 with GFR of 26, presented with 3.43 which worsened And peaked at 5.07, gradually improving, down to 4.16 yesterday and further 3.83 today.  CO2 also improving, he was on bicarb drip, managed by nephrology.  Nephrology signed off yesterday and recommended discharge however we kept him 1 more night to make sure his creatinine continues to improve.  He received IV Lasix which was switched to oral Lasix yesterday.  He is improving.  He was assessed by PT OT who recommended SNF however patient declined that and chose to go home so he is being discharged today with home health.   Chest pain: Patient also complained of chest pain, mostly after coughing.  He also tells me that he had herpes zoster at the right anterior chest going all the way under the right axilla 3 months ago and he continues to have pain there despite of being on gabapentin.  Troponin only slightly elevated on almost flat.  Echo once again showed 60  to 65% ejection fraction with grade 2 diastolic dysfunction and no wall motion abnormality.   Acute on chronic chronic diastolic congestive heart failure: Patient had elevated BNP but initial chest x-ray negative for pulm edema or vascular congestion.  On 06/05/2021 around noon, patient had worsening shortness of breath and he had crackles on exam, chest x-ray was done and at that point in time this showed vascular congestion/pulmonary edema.  We gave him 1  dose of Lasix, he felt better.  He was given another dose of Lasix by nephrology on 06/06/2021 and was continued on IV Lasix.  Lasix transition from IV to oral on 06/11/2021.   Essential hypertension: Blood pressure controlled.  Continue home medications which includes amlodipine, finasteride, hydralazine and metoprolol.   Oral candidiasis: He received nystatin here, it is resolved now.   Hyperlipidemia: Continue statin.   Diabetes mellitus type 2: Blood sugar controlled on SSI.   GERD: Continue PPI.   Neuropathy: Continue home nortriptyline and gabapentin.   Chronic mild depression: Continue Lexapro.   Generalized weakness: Seen by PT OT, they recommended SNF however patient desires to go home with home health.  Discharge plan was discussed with patient and/or family member and they verbalized understanding and agreed with it.  Discharge Diagnoses:  Principal Problem:   Pneumonia Active Problems:   Type 2 diabetes mellitus with peripheral neuropathy (HCC)   Essential hypertension   Depression   Hyperlipidemia   Gastroesophageal reflux disease without esophagitis   CKD (chronic kidney disease), stage III (HCC)   Acute respiratory failure with hypoxia (HCC)   Acute renal failure superimposed on stage 4 chronic kidney disease (HCC)   Coronary artery disease   Severe sepsis [facility])    Discharge Instructions   Allergies as of 06/13/2021       Reactions   Claritin [loratadine] Swelling   Joint swelling   Hydrochlorothiazide Other (See Comments)   Dizziness   Latex Hives   Lyrica [pregabalin] Other (See Comments)   depression   Metformin And Related Other (See Comments)   GI        Medication List     STOP taking these medications    carvedilol 6.25 MG tablet Commonly known as: COREG       TAKE these medications    acetaminophen 500 MG tablet Commonly known as: TYLENOL Take 1,000 mg by mouth every 6 (six) hours as needed for mild pain or headache.    amLODipine 10 MG tablet Commonly known as: NORVASC Take 1 tablet (10 mg total) by mouth daily.   aspirin EC 81 MG tablet Take 1 tablet (81 mg total) by mouth daily. Swallow whole.   atorvastatin 40 MG tablet Commonly known as: LIPITOR Take 1 tablet (40 mg total) by mouth daily.   Blood Pressure Monitor Devi Use as directed to check home blood pressure 2-3 times a week   D-Mannose 350 MG Caps Take 1,050 capsules by mouth in the morning and at bedtime.   escitalopram 20 MG tablet Commonly known as: LEXAPRO Take 1 tablet (20 mg total) by mouth daily.   finasteride 5 MG tablet Commonly known as: PROSCAR Take 1 tablet (5 mg total) by mouth daily.   furosemide 40 MG tablet Commonly known as: Lasix Take 1 tablet (40 mg total) by mouth 2 (two) times daily.   gabapentin 300 MG capsule Commonly known as: NEURONTIN Take 300 mg by mouth 3 (three) times daily.   glipiZIDE 5 MG tablet Commonly known as: GLUCOTROL Take 5  mg by mouth daily.   hydrALAZINE 100 MG tablet Commonly known as: APRESOLINE Take 1 tablet (100 mg total) by mouth 3 (three) times daily.   Lantus SoloStar 100 UNIT/ML Solostar Pen Generic drug: insulin glargine Inject 40 Units into the skin at bedtime.   lisinopril 10 MG tablet Commonly known as: ZESTRIL Take 10 mg by mouth daily.   metoprolol succinate 50 MG 24 hr tablet Commonly known as: TOPROL-XL Take 1 tablet (50 mg total) by mouth at bedtime. Take with or immediately following a meal.   nortriptyline 25 MG capsule Commonly known as: PAMELOR Take 1 capsule (25 mg total) by mouth at bedtime.   pantoprazole 40 MG tablet Commonly known as: PROTONIX Take 1 tablet (40 mg total) by mouth daily.   potassium chloride 10 MEQ tablet Commonly known as: KLOR-CON M Take 1 tablet (10 mEq total) by mouth daily.   tamsulosin 0.4 MG Caps capsule Commonly known as: FLOMAX Take 0.4 mg by mouth at bedtime.   traZODone 50 MG tablet Commonly known as:  DESYREL Take 3 tablets (150 mg total) by mouth at bedtime. What changed:  when to take this reasons to take this   True Metrix Blood Glucose Test test strip Generic drug: glucose blood Use as instructed   True Metrix Meter w/Device Kit Use as directed   TRUEplus Insulin Syringe 31G X 5/16" 0.3 ML Misc Generic drug: Insulin Syringe-Needle U-100 Use to inject Levemir at bedtime.   TRUEplus Lancets 28G Misc Use as directed        Follow-up Information     Armanda Heritage, NP Follow up in 1 week(s).   Specialty: Nurse Practitioner Contact information: [address] [place] [address] [phone]                Allergies  Allergen Reactions   Claritin [Loratadine] Swelling    Joint swelling    Hydrochlorothiazide Other (See Comments)    Dizziness   Latex Hives   Lyrica [Pregabalin] Other (See Comments)    depression   Metformin And Related Other (See Comments)    GI    Consultations: Nephrology   Procedures/Studies: DG Chest 2 View  Result Date: 06/04/2021 CLINICAL DATA:  Chest pain.  Shortness of breath. EXAM: CHEST - 2 VIEW COMPARISON:  February 07, 2021 FINDINGS: Mild patchy opacity in the right mid lower lung. The cardiomediastinal silhouette is stable. No pneumothorax. No nodules or masses. No left-sided infiltrates. IMPRESSION: Mild patchy opacities in the right lung are worrisome for developing pneumonia. Recommend short-term follow-up imaging to ensure resolution. Electronically Signed   By: Dorise Bullion III M.D.   On: 06/04/2021 17:09   US RENAL  Result Date: 06/06/2021 CLINICAL DATA:  Inpatient. Renal failure. Stage 4 chronic kidney disease. EXAM: RENAL / URINARY TRACT ULTRASOUND COMPLETE COMPARISON:  02/05/2021 renal sonogram. FINDINGS: Right Kidney: Renal measurements: 11.2 x 6.1 x 6.4 cm = volume: 229 mL. Limited visualization of the lower pole due to overlying bowel gas. Echogenic right renal parenchyma, normal thickness.  No renal masses. No hydronephrosis. Left Kidney: Renal measurements: 11.6 x 7.3 x 6.6 = volume: 295 cc. Echogenic renal parenchyma, normal thickness. No renal masses. No hydronephrosis. Echogenicity within normal limits. No mass or hydronephrosis visualized. Bladder: Bladder not visualized, either due to empty bladder and/or overlying bowel gas. Other: Trace simple fluid in Morrison's pouch. IMPRESSION: 1. No hydronephrosis. 2. Echogenic normal size kidneys, compatible with reported clinical history of nonspecific acute on chronic renal parenchymal  disease. 3. Nonvisualization of the urinary bladder. 4. Trace simple fluid in Morrison's pouch. Electronically Signed   By: Ilona Sorrel M.D.   On: 06/06/2021 15:09   DG CHEST PORT 1 VIEW  Result Date: 06/05/2021 CLINICAL DATA:  Shortness of breath. History of congestive heart failure, diabetes, hypertension EXAM: PORTABLE CHEST 1 VIEW COMPARISON:  Chest x-ray from yesterday FINDINGS: Mild cardiomegaly. There are confluent opacities seen at the perihilar regions greater on the right side predominantly in the right middle and lower lobes likely on the basis of worsening pulmonary edema. Superimposed pneumonia cannot be entirely excluded. No pleural effusion. The visualized skeletal structures are unremarkable. IMPRESSION: Perihilar batwing kind of opacities greater on the right likely on the basis of pulmonary edema and has worsened in the interim. Superimposed pneumonia cannot be excluded. Electronically Signed   By: Frazier Richards M.D.   On: 06/05/2021 13:01   ECHOCARDIOGRAM COMPLETE  Result Date: 06/05/2021    ECHOCARDIOGRAM REPORT   Patient Name:   CHATHAM HOWINGTON Date of Exam: 06/05/2021 Medical Rec #:  [id number]         Height:       74.0 in Accession #:    [id number]        Weight:       222.0 lb Date of Birth:  08-Aug-1960         BSA:          2.272 m Patient Age:    61 years          BP:           134/68 mmHg Patient Gender: M                 HR:            103 bpm. Exam Location:  Inpatient Procedure: 2D Echo, Color Doppler and Cardiac Doppler Indications:    CHF  History:        Patient has prior history of Echocardiogram examinations. CHF,                 CAD; Risk Factors:Hypertension and Diabetes.  Sonographer:    Jyl Heinz Referring Phys: [id number] Shamonica Schadt IMPRESSIONS  1. Left ventricular ejection fraction, by estimation, is 60 to 65%. The left ventricle has normal function. The left ventricle has no regional wall motion abnormalities. There is moderate concentric left ventricular hypertrophy. Left ventricular diastolic parameters are consistent with Grade II diastolic dysfunction (pseudonormalization).  2. Right ventricular systolic function is normal. The right ventricular size is normal.  3. The mitral valve is normal in structure. Trivial mitral valve regurgitation.  4. The aortic valve is tricuspid. There is mild calcification of the aortic valve. There is mild thickening of the aortic valve. Aortic valve regurgitation is not visualized. Aortic valve sclerosis/calcification is present, without any evidence of aortic stenosis.  5. The inferior vena cava is normal in size with greater than 50% respiratory variability, suggesting right atrial pressure of 3 mmHg. Comparison(s): Compared to prior TTE in 01/2021, there is no significant change. FINDINGS  Left Ventricle: Left ventricular ejection fraction, by estimation, is 60 to 65%. The left ventricle has normal function. The left ventricle has no regional wall motion abnormalities. The left ventricular internal cavity size was normal in size. There is  moderate concentric left ventricular hypertrophy. Left ventricular diastolic parameters are consistent with Grade II diastolic dysfunction (pseudonormalization). Right Ventricle: The right ventricular size is normal. No increase in right ventricular wall thickness.  Right ventricular systolic function is normal. Left Atrium: Left atrial size was normal in  size. Right Atrium: Right atrial size was normal in size. Pericardium: Trivial pericardial effusion is present. Mitral Valve: The mitral valve is normal in structure. There is mild thickening of the mitral valve leaflet(s). Trivial mitral valve regurgitation. Tricuspid Valve: The tricuspid valve is normal in structure. Tricuspid valve regurgitation is trivial. Aortic Valve: The aortic valve is tricuspid. There is mild calcification of the aortic valve. There is mild thickening of the aortic valve. Aortic valve regurgitation is not visualized. Aortic valve sclerosis/calcification is present, without any evidence of aortic stenosis. Aortic valve peak gradient measures 6.9 mmHg. Pulmonic Valve: The pulmonic valve was normal in structure. Pulmonic valve regurgitation is trivial. Aorta: The aortic root and ascending aorta are structurally normal, with no evidence of dilitation. Venous: The inferior vena cava is normal in size with greater than 50% respiratory variability, suggesting right atrial pressure of 3 mmHg. IAS/Shunts: The atrial septum is grossly normal.  LEFT VENTRICLE PLAX 2D LVIDd:         5.00 cm      Diastology LVIDs:         3.40 cm      LV e' medial:    6.64 cm/s LV PW:         1.00 cm      LV E/e' medial:  18.5 LV IVS:        1.20 cm      LV e' lateral:   8.81 cm/s LVOT diam:     2.10 cm      LV E/e' lateral: 14.0 LV SV:         55 LV SV Index:   24 LVOT Area:     3.46 cm  LV Volumes (MOD) LV vol d, MOD A2C: 145.0 ml LV vol d, MOD A4C: 153.0 ml LV vol s, MOD A2C: 62.7 ml LV vol s, MOD A4C: 65.5 ml LV SV MOD A2C:     82.3 ml LV SV MOD A4C:     153.0 ml LV SV MOD BP:      85.5 ml RIGHT VENTRICLE            IVC RV Basal diam:  3.70 cm    IVC diam: 1.70 cm RV S prime:     9.95 cm/s TAPSE (M-mode): 1.8 cm LEFT ATRIUM           Index        RIGHT ATRIUM           Index LA diam:      4.50 cm 1.98 cm/m   RA Area:     15.50 cm LA Vol (A2C): 56.8 ml 25.00 ml/m  RA Volume:   39.20 ml  17.25 ml/m LA Vol (A4C):  71.2 ml 31.34 ml/m  AORTIC VALVE AV Area (Vmax): 2.48 cm AV Vmax:        131.00 cm/s AV Peak Grad:   6.9 mmHg LVOT Vmax:      93.80 cm/s LVOT Vmean:     71.300 cm/s LVOT VTI:       0.159 m  AORTA Ao Root diam: 3.40 cm Ao Asc diam:  3.30 cm MITRAL VALVE MV Area (PHT): 6.07 cm     SHUNTS MV Decel Time: 125 msec     Systemic VTI:  0.16 m MV E velocity: 123.00 cm/s  Systemic Diam: 2.10 cm MV A velocity: 66.10 cm/s MV E/A ratio:  1.86 Heather  Pemberton MD Electronically signed by Gwyndolyn Kaufman MD Signature Date/Time: 06/05/2021/2:51:43 PM    Final      Discharge Exam: Vitals:   06/13/21 0410 06/13/21 0818  BP: (!) 161/75 (!) 152/84  Pulse: 68 81  Resp: 16 10  Temp: 98.3 F (36.8 C) 98 F (36.7 C)  SpO2: 99% 100%   Vitals:   06/12/21 2004 06/12/21 2354 06/13/21 0410 06/13/21 0818  BP: (!) 142/72 (!) 146/72 (!) 161/75 (!) 152/84  Pulse: 68 73 68 81  Resp: _0 Temp: 97.9 F (36.6 C) 97.9 F (36.6 C) 98.3 F (36.8 C) 98 F (36.7 C)  TempSrc: Oral Oral Oral Oral  SpO2: 100% 100% 99% 100%  Weight:   95.2 kg   Height:        General: Pt is alert, awake, not in acute distress Cardiovascular: RRR, S1/S2 +, no rubs, no gallops Respiratory: CTA bilaterally, no wheezing, no rhonchi Abdominal: Soft, NT, ND, bowel sounds + Extremities: no edema, no cyanosis    The results of significant diagnostics from this hospitalization (including imaging, microbiology, ancillary and laboratory) are listed below for reference.     Microbiology: Recent Results (from the past 240 hour(s))  Blood culture (routine x 2)     Status: None   Collection Time: 06/04/21  6:17 PM   Specimen: BLOOD LEFT ARM  Result Value Ref Range Status   Specimen Description BLOOD LEFT ARM  Final   Special Requests   Final    BOTTLES DRAWN AEROBIC AND ANAEROBIC Blood Culture results may not be optimal due to an inadequate volume of blood received in culture bottles   Culture   Final    NO GROWTH 5  DAYS Performed at [facility], [place] [address], [place], [place] [address]    Report Status 06/09/2021 FINAL  Final  Resp Panel by RT-PCR (Flu A&B, Covid) Peripheral     Status: None   Collection Time: 06/04/21  7:08 PM   Specimen: Peripheral; Nasopharyngeal(NP) swabs in vial transport medium  Result Value Ref Range Status   SARS Coronavirus 2 by RT PCR NEGATIVE NEGATIVE Final    Comment: (NOTE) SARS-CoV-2 target nucleic acids are NOT DETECTED.  The SARS-CoV-2 RNA is generally detectable in upper respiratory specimens during the acute phase of infection. The lowest concentration of SARS-CoV-2 viral copies this assay can detect is 138 copies/mL. A negative result does not preclude SARS-Cov-2 infection and should not be used as the sole basis for treatment or other patient management decisions. A negative result may occur with  improper specimen collection/handling, submission of specimen other than nasopharyngeal swab, presence of viral mutation(s) within the areas targeted by this assay, and inadequate number of viral copies(<138 copies/mL). A negative result must be combined with clinical observations, patient history, and epidemiological information. The expected result is Negative.  Fact Sheet for Patients:  EntrepreneurPulse.com.au  Fact Sheet for Healthcare Providers:  IncredibleEmployment.be  This test is no t yet approved or cleared by the [place] FDA and  has been authorized for detection and/or diagnosis of SARS-CoV-2 by FDA under an Emergency Use Authorization (EUA). This EUA will remain  in effect (meaning this test can be used) for the duration of the COVID-19 declaration under Section 564(b)(1) of the Act, 21 U.S.C.section 360bbb-3(b)(1), unless the authorization is terminated  or revoked sooner.       Influenza A by PCR NEGATIVE NEGATIVE Final   Influenza B by PCR NEGATIVE NEGATIVE Final  Comment: (NOTE) The  Xpert Xpress SARS-CoV-2/FLU/RSV plus assay is intended as an aid in the diagnosis of influenza from Nasopharyngeal swab specimens and should not be used as a sole basis for treatment. Nasal washings and aspirates are unacceptable for Xpert Xpress SARS-CoV-2/FLU/RSV testing.  Fact Sheet for Patients: EntrepreneurPulse.com.au  Fact Sheet for Healthcare Providers: IncredibleEmployment.be  This test is not yet approved or cleared by the [place] FDA and has been authorized for detection and/or diagnosis of SARS-CoV-2 by FDA under an Emergency Use Authorization (EUA). This EUA will remain in effect (meaning this test can be used) for the duration of the COVID-19 declaration under Section 564(b)(1) of the Act, 21 U.S.C. section 360bbb-3(b)(1), unless the authorization is terminated or revoked.  Performed at [facility], [place] [address] [place], [place] [address]   Blood culture (routine x 2)     Status: None   Collection Time: 06/04/21  7:19 PM   Specimen: BLOOD RIGHT ARM  Result Value Ref Range Status   Specimen Description BLOOD RIGHT ARM  Final   Special Requests   Final    BOTTLES DRAWN AEROBIC AND ANAEROBIC Blood Culture adequate volume   Culture   Final    NO GROWTH 5 DAYS Performed at [facility], [place] [address], [place], [place] [address]    Report Status 06/09/2021 FINAL  Final     Labs: BNP (last 3 results) Recent Labs    02/05/21 0831 06/04/21 1620 06/08/21 0208  BNP 863.4* 1,151.1* 6,384.6*   Basic Metabolic Panel: Recent Labs  Lab 06/08/21 0208 06/09/21 0148 06/10/21 0137 06/11/21 0206 06/12/21 0155 06/13/21 0148  NA 132* 136 133* 138 134* 136  K 3.9 3.9 3.7 3.9 3.8 4.3  CL 107 108 105 106 102 105  CO2 15* 15* 17* 20* 21* 20*  GLUCOSE 142* 104* 110* 111* 130* 98  BUN 82* 88* 87* 81* 75* 70*  CREATININE 5.07* 4.96* 4.73* 4.53* 4.16* 3.83*  CALCIUM 8.1* 8.3* 8.3* 8.7* 8.4* 8.3*  MG 2.0  --   --    --   --   --   PHOS  --   --  5.1* 4.7* 4.8* 5.0*   Liver Function Tests: Recent Labs  Lab 06/10/21 0137 06/11/21 0206 06/12/21 0155 06/13/21 0148  ALBUMIN 2.4* 2.7* 2.5* 2.6*   No results for input(s): LIPASE, AMYLASE in the last 168 hours. No results for input(s): AMMONIA in the last 168 hours. CBC: Recent Labs  Lab 06/08/21 0208  WBC 7.8  NEUTROABS 6.2  HGB 8.4*  HCT 26.2*  MCV 91.3  PLT 270   Cardiac Enzymes: No results for input(s): CKTOTAL, CKMB, CKMBINDEX, TROPONINI in the last 168 hours. BNP: Invalid input(s): POCBNP CBG: Recent Labs  Lab 06/12/21 0622 06/12/21 1143 06/12/21 1604 06/12/21 2105 06/13/21 0617  GLUCAP 136* 167* 110* 139* 106*   D-Dimer No results for input(s): DDIMER in the last 72 hours. Hgb A1c No results for input(s): HGBA1C in the last 72 hours. Lipid Profile No results for input(s): CHOL, HDL, LDLCALC, TRIG, CHOLHDL, LDLDIRECT in the last 72 hours. Thyroid function studies No results for input(s): TSH, T4TOTAL, T3FREE, THYROIDAB in the last 72 hours.  Invalid input(s): FREET3 Anemia work up No results for input(s): VITAMINB12, FOLATE, FERRITIN, TIBC, IRON, RETICCTPCT in the last 72 hours. Urinalysis    Component Value Date/Time   COLORURINE YELLOW 06/06/2021 1227   APPEARANCEUR CLOUDY (A) 06/06/2021 1227   LABSPEC 1.016 06/06/2021 [place]  5.0 06/06/2021 1227   GLUCOSEU NEGATIVE 06/06/2021 1227   HGBUR SMALL (A) 06/06/2021 [place] 06/06/2021 1227   [place] 06/06/2021 1227   PROTEINUR 100 (A) 06/06/2021 1227   NITRITE NEGATIVE 06/06/2021 1227   LEUKOCYTESUR LARGE (A) 06/06/2021 1227   Sepsis Labs Invalid input(s): PROCALCITONIN,  WBC,  LACTICIDVEN Microbiology Recent Results (from the past 240 hour(s))  Blood culture (routine x 2)     Status: None   Collection Time: 06/04/21  6:17 PM   Specimen: BLOOD LEFT ARM  Result Value Ref Range Status   Specimen Description BLOOD LEFT ARM   Final   Special Requests   Final    BOTTLES DRAWN AEROBIC AND ANAEROBIC Blood Culture results may not be optimal due to an inadequate volume of blood received in culture bottles   Culture   Final    NO GROWTH 5 DAYS Performed at [facility], [place] [address], [place], [place] [address]    Report Status 06/09/2021 FINAL  Final  Resp Panel by RT-PCR (Flu A&B, Covid) Peripheral     Status: None   Collection Time: 06/04/21  7:08 PM   Specimen: Peripheral; Nasopharyngeal(NP) swabs in vial transport medium  Result Value Ref Range Status   SARS Coronavirus 2 by RT PCR NEGATIVE NEGATIVE Final    Comment: (NOTE) SARS-CoV-2 target nucleic acids are NOT DETECTED.  The SARS-CoV-2 RNA is generally detectable in upper respiratory specimens during the acute phase of infection. The lowest concentration of SARS-CoV-2 viral copies this assay can detect is 138 copies/mL. A negative result does not preclude SARS-Cov-2 infection and should not be used as the sole basis for treatment or other patient management decisions. A negative result may occur with  improper specimen collection/handling, submission of specimen other than nasopharyngeal swab, presence of viral mutation(s) within the areas targeted by this assay, and inadequate number of viral copies(<138 copies/mL). A negative result must be combined with clinical observations, patient history, and epidemiological information. The expected result is Negative.  Fact Sheet for Patients:  EntrepreneurPulse.com.au  Fact Sheet for Healthcare Providers:  IncredibleEmployment.be  This test is no t yet approved or cleared by the [place] FDA and  has been authorized for detection and/or diagnosis of SARS-CoV-2 by FDA under an Emergency Use Authorization (EUA). This EUA will remain  in effect (meaning this test can be used) for the duration of the COVID-19 declaration under Section 564(b)(1) of the Act,  21 U.S.C.section 360bbb-3(b)(1), unless the authorization is terminated  or revoked sooner.       Influenza A by PCR NEGATIVE NEGATIVE Final   Influenza B by PCR NEGATIVE NEGATIVE Final    Comment: (NOTE) The Xpert Xpress SARS-CoV-2/FLU/RSV plus assay is intended as an aid in the diagnosis of influenza from Nasopharyngeal swab specimens and should not be used as a sole basis for treatment. Nasal washings and aspirates are unacceptable for Xpert Xpress SARS-CoV-2/FLU/RSV testing.  Fact Sheet for Patients: EntrepreneurPulse.com.au  Fact Sheet for Healthcare Providers: IncredibleEmployment.be  This test is not yet approved or cleared by the [place] FDA and has been authorized for detection and/or diagnosis of SARS-CoV-2 by FDA under an Emergency Use Authorization (EUA). This EUA will remain in effect (meaning this test can be used) for the duration of the COVID-19 declaration under Section 564(b)(1) of the Act, 21 U.S.C. section 360bbb-3(b)(1), unless the authorization is terminated or revoked.  Performed at [facility], [place] [address], [place], [place] [address]  Blood culture (routine x 2)     Status: None   Collection Time: 06/04/21  7:19 PM   Specimen: BLOOD RIGHT ARM  Result Value Ref Range Status   Specimen Description BLOOD RIGHT ARM  Final   Special Requests   Final    BOTTLES DRAWN AEROBIC AND ANAEROBIC Blood Culture adequate volume   Culture   Final    NO GROWTH 5 DAYS Performed at [facility], [address] [place], [place] [address]    Report Status 06/09/2021 FINAL  Final     Time coordinating discharge: Over 30 minutes  SIGNED:   Darliss Cheney, MD  [facility] 06/13/2021, 10:13 AM *Please note that this is a verbal dictation therefore any spelling or grammatical errors are due to the "[place] [employer]" system interpretation. If 7PM-7AM, please contact night-coverage [email or web address]

## 2021-06-13 NOTE — Progress Notes (Signed)
Patient given discharge instructions. PIV removed. Telemetry box removed, CCMD notified. Patient taken to vehicle by staff in wheelchair.  Daymon Larsen, RN

## 2021-06-13 NOTE — TOC Progression Note (Addendum)
Transition of Care [facility]) - Progression Note    Patient Details  Name: Ryan Jenkins MRN: [id number] Date of Birth: [birth year]  Transition of Care [facility]) CM/SW [place], RN Phone Number: 06/13/2021, 11:42 AM  Clinical Narrative:     Called [employer] for assistance in home health agency Left message to return call [facility] for review, they cannot service due to managed medicaid. [facility] will service the patient. For PT  [facility] from insurance called back about DC planning, ut all has been set up.   Expected Discharge Plan: [place] Barriers to Discharge: Continued Medical Work up  Expected Discharge Plan and Services Expected Discharge Plan: [place]         Expected Discharge Date: 06/13/21                                     Social Determinants of Health (SDOH) Interventions    Readmission Risk Interventions     View : No data to display.

## 2021-06-13 NOTE — Progress Notes (Signed)
Physical Therapy Treatment Patient Details Name: Ryan Jenkins MRN: [id number] DOB: [birth year] Today's Date: 06/13/2021   History of Present Illness Patient is a 61 yo male presenting to the ED with chest pain, mild SOB, nausea, and vomiting x 12 hours on 06/04/21. Admitted with acute hypoxic respiratory failure and severe sepsis secondary to pneumonia same day. PMH includes: diabetes with neuropathy and gastroparesis, hypertension, CKD 4, depression, anxiety, hyperlipidemia, GERD, CHF, CAD, anemia    PT Comments    Pt received in supine, c/o mild fatigue after showering with NT assist but agreeable to therapy session with encouragement. Pt making good progress toward goals, able to perform household distance gait trial with RW and up to min guard (mostly Supervision) and needing increased assist (up to modA) for transfers to/from lowest bed height. Emphasis on activity pacing, LE exercises for ROM/strengthening and benefits of gradual progression of mobility within tolerance. Pt continues to benefit from PT services to progress toward functional mobility goals.   Recommendations for follow up therapy are one component of a multi-disciplinary discharge planning process, led by the attending physician.  Recommendations may be updated based on patient status, additional functional criteria and insurance authorization.  Follow Up Recommendations  Home health PT     Assistance Recommended at Discharge Intermittent Supervision/Assistance  Patient can return home with the following A lot of help with walking and/or transfers;A lot of help with bathing/dressing/bathroom;Help with stairs or ramp for entrance;Assist for transportation   Equipment Recommendations  None recommended by PT    Recommendations for Other Services       Precautions / Restrictions Precautions Precautions: Fall Precaution Comments: watch O2 Restrictions Weight Bearing Restrictions: No     Mobility  Bed  Mobility Overal bed mobility: Needs Assistance Bed Mobility: Supine to Sit     Supine to sit: Supervision     General bed mobility comments: increased time to perform, cues for line awareness    Transfers Overall transfer level: Needs assistance Equipment used: Rolling walker (2 wheels) Transfers: Sit to/from Stand Sit to Stand: Mod assist, Min assist           General transfer comment: modA to stand from lowest bed height and minA for stand>sit due to decreased eccentric control to sit    Ambulation/Gait Ambulation/Gait assistance: Min guard Gait Distance (Feet): 125 Feet Assistive device: Rolling walker (2 wheels) Gait Pattern/deviations: Trunk flexed, Decreased dorsiflexion - right, Decreased dorsiflexion - left       General Gait Details: RW lowered for improved height to prevent pt from hunching B shoulders up, pt reports hx neuropathy in feet and decreased B heel strike   Stairs Stairs:  (he reports no STE)         Balance Overall balance assessment: Mild deficits observed, not formally tested                                          Cognition Arousal/Alertness: Awake/alert Behavior During Therapy: WFL for tasks assessed/performed Overall Cognitive Status: Within Functional Limits for tasks assessed                                 General Comments: Flat affect but participatory with encouragement.        Exercises Other Exercises Other Exercises: reviewed standing/supine technique for B heel cord stretching, pt  receptive    General Comments General comments (skin integrity, edema, etc.): HR 70's bpm pre/post exertion, BP stable per chart review, SpO2 100% on RA      Pertinent Vitals/Pain Pain Assessment Pain Assessment: Faces Faces Pain Scale: Hurts a little bit Pain Location: hx neuropathy with intermittent c/o foot and hand discomfort- more "numbness" Pain Descriptors / Indicators: Discomfort Pain  Intervention(s): Monitored during session, Repositioned, Limited activity within patient's tolerance     PT Goals (current goals can now be found in the care plan section) Acute Rehab PT Goals Patient Stated Goal: to go home PT Goal Formulation: With patient Time For Goal Achievement: 06/23/21 Progress towards PT goals: Progressing toward goals    Frequency    Min 3X/week      PT Plan Current plan remains appropriate       AM-PAC PT "6 Clicks" Mobility   Outcome Measure  Help needed turning from your back to your side while in a flat bed without using bedrails?: None Help needed moving from lying on your back to sitting on the side of a flat bed without using bedrails?: A Little Help needed moving to and from a bed to a chair (including a wheelchair)?: A Little Help needed standing up from a chair using your arms (e.g., wheelchair or bedside chair)?: A Lot Help needed to walk in hospital room?: A Little Help needed climbing 3-5 steps with a railing? : Total 6 Click Score: 16    End of Session Equipment Utilized During Treatment: Gait belt Activity Tolerance: Patient tolerated treatment well Patient left: in chair;with call bell/phone within reach;with chair alarm set;Other (comment) (heels floated) Nurse Communication: Mobility status;Other (comment) (IV frequently beeping) PT Visit Diagnosis: Unsteadiness on feet (R26.81);Muscle weakness (generalized) (M62.81)     Time: 3729-0211 PT Time Calculation (min) (ACUTE ONLY): 26 min  Charges:  $Gait Training: 8-22 mins $Therapeutic Activity: 8-22 mins                     Danielle Lento P., PTA Acute Rehabilitation Services Secure Chat Preferred 9a-5:30pm Office: [place] 06/13/2021, 2:17 PM

## 2021-07-03 ENCOUNTER — Inpatient Hospital Stay (HOSPITAL_COMMUNITY): Payer: Medicaid Other

## 2021-07-03 ENCOUNTER — Emergency Department (HOSPITAL_COMMUNITY): Payer: Medicaid Other

## 2021-07-03 ENCOUNTER — Inpatient Hospital Stay (HOSPITAL_COMMUNITY)
Admission: EM | Admit: 2021-07-03 | Discharge: 2021-07-05 | DRG: 149 | Disposition: A | Discharge status: Home / Self Care | Payer: Medicaid Other | Attending: Neurology | Admitting: Neurology

## 2021-07-03 DIAGNOSIS — R471 Dysarthria and anarthria: Secondary | ICD-10-CM | POA: Diagnosis present

## 2021-07-03 DIAGNOSIS — R112 Nausea with vomiting, unspecified: Secondary | ICD-10-CM

## 2021-07-03 DIAGNOSIS — Z79899 Other long term (current) drug therapy: Secondary | ICD-10-CM

## 2021-07-03 DIAGNOSIS — I2510 Atherosclerotic heart disease of native coronary artery without angina pectoris: Secondary | ICD-10-CM | POA: Diagnosis present

## 2021-07-03 DIAGNOSIS — Z7982 Long term (current) use of aspirin: Secondary | ICD-10-CM

## 2021-07-03 DIAGNOSIS — E1122 Type 2 diabetes mellitus with diabetic chronic kidney disease: Secondary | ICD-10-CM | POA: Diagnosis present

## 2021-07-03 DIAGNOSIS — I6529 Occlusion and stenosis of unspecified carotid artery: Secondary | ICD-10-CM | POA: Diagnosis present

## 2021-07-03 DIAGNOSIS — I130 Hypertensive heart and chronic kidney disease with heart failure and stage 1 through stage 4 chronic kidney disease, or unspecified chronic kidney disease: Secondary | ICD-10-CM | POA: Diagnosis present

## 2021-07-03 DIAGNOSIS — E538 Deficiency of other specified B group vitamins: Secondary | ICD-10-CM | POA: Diagnosis present

## 2021-07-03 DIAGNOSIS — N400 Enlarged prostate without lower urinary tract symptoms: Secondary | ICD-10-CM | POA: Diagnosis present

## 2021-07-03 DIAGNOSIS — Z20822 Contact with and (suspected) exposure to covid-19: Secondary | ICD-10-CM | POA: Diagnosis present

## 2021-07-03 DIAGNOSIS — E1159 Type 2 diabetes mellitus with other circulatory complications: Secondary | ICD-10-CM | POA: Diagnosis not present

## 2021-07-03 DIAGNOSIS — N184 Chronic kidney disease, stage 4 (severe): Secondary | ICD-10-CM | POA: Diagnosis present

## 2021-07-03 DIAGNOSIS — E11319 Type 2 diabetes mellitus with unspecified diabetic retinopathy without macular edema: Secondary | ICD-10-CM | POA: Diagnosis present

## 2021-07-03 DIAGNOSIS — I5032 Chronic diastolic (congestive) heart failure: Secondary | ICD-10-CM | POA: Diagnosis present

## 2021-07-03 DIAGNOSIS — R42 Dizziness and giddiness: Secondary | ICD-10-CM | POA: Diagnosis not present

## 2021-07-03 DIAGNOSIS — Z7984 Long term (current) use of oral hypoglycemic drugs: Secondary | ICD-10-CM

## 2021-07-03 DIAGNOSIS — H818X3 Other disorders of vestibular function, bilateral: Secondary | ICD-10-CM | POA: Diagnosis present

## 2021-07-03 DIAGNOSIS — Z794 Long term (current) use of insulin: Secondary | ICD-10-CM

## 2021-07-03 DIAGNOSIS — F32A Depression, unspecified: Secondary | ICD-10-CM | POA: Diagnosis present

## 2021-07-03 DIAGNOSIS — Z823 Family history of stroke: Secondary | ICD-10-CM

## 2021-07-03 DIAGNOSIS — K219 Gastro-esophageal reflux disease without esophagitis: Secondary | ICD-10-CM | POA: Diagnosis present

## 2021-07-03 DIAGNOSIS — F329 Major depressive disorder, single episode, unspecified: Secondary | ICD-10-CM | POA: Diagnosis not present

## 2021-07-03 DIAGNOSIS — G4700 Insomnia, unspecified: Secondary | ICD-10-CM | POA: Diagnosis present

## 2021-07-03 DIAGNOSIS — F419 Anxiety disorder, unspecified: Secondary | ICD-10-CM | POA: Diagnosis present

## 2021-07-03 DIAGNOSIS — E785 Hyperlipidemia, unspecified: Secondary | ICD-10-CM | POA: Diagnosis present

## 2021-07-03 DIAGNOSIS — I152 Hypertension secondary to endocrine disorders: Secondary | ICD-10-CM

## 2021-07-03 DIAGNOSIS — R278 Other lack of coordination: Secondary | ICD-10-CM | POA: Diagnosis present

## 2021-07-03 DIAGNOSIS — I639 Cerebral infarction, unspecified: Secondary | ICD-10-CM | POA: Diagnosis present

## 2021-07-03 DIAGNOSIS — E1140 Type 2 diabetes mellitus with diabetic neuropathy, unspecified: Secondary | ICD-10-CM | POA: Diagnosis present

## 2021-07-03 DIAGNOSIS — Z79891 Long term (current) use of opiate analgesic: Secondary | ICD-10-CM

## 2021-07-03 DIAGNOSIS — D631 Anemia in chronic kidney disease: Secondary | ICD-10-CM | POA: Diagnosis present

## 2021-07-03 DIAGNOSIS — Z91040 Latex allergy status: Secondary | ICD-10-CM

## 2021-07-03 DIAGNOSIS — H5461 Unqualified visual loss, right eye, normal vision left eye: Secondary | ICD-10-CM | POA: Diagnosis present

## 2021-07-03 DIAGNOSIS — Z888 Allergy status to other drugs, medicaments and biological substances status: Secondary | ICD-10-CM

## 2021-07-03 DIAGNOSIS — R29701 NIHSS score 1: Secondary | ICD-10-CM | POA: Diagnosis present

## 2021-07-03 DIAGNOSIS — E1142 Type 2 diabetes mellitus with diabetic polyneuropathy: Secondary | ICD-10-CM

## 2021-07-03 DIAGNOSIS — Z8249 Family history of ischemic heart disease and other diseases of the circulatory system: Secondary | ICD-10-CM

## 2021-07-03 DIAGNOSIS — Z560 Unemployment, unspecified: Secondary | ICD-10-CM

## 2021-07-03 LAB — URINALYSIS, ROUTINE W REFLEX MICROSCOPIC
Bilirubin Urine: NEGATIVE
Glucose, UA: 50 mg/dL — AB
Ketones, ur: NEGATIVE mg/dL
Nitrite: NEGATIVE
Protein, ur: >=300 mg/dL — AB
Specific Gravity, Urine: 1.013 (ref 1.005–1.030)
WBC, UA: >50 WBC/hpf — ABNORMAL HIGH (ref 0–5)
pH: 6 (ref 5.0–8.0)

## 2021-07-03 LAB — DIFFERENTIAL
Abs Immature Granulocytes: 0.01 10*3/uL (ref 0.00–0.07)
Basophils Absolute: 0.1 10*3/uL (ref 0.0–0.1)
Basophils Relative: 1 %
Eosinophils Absolute: 0.3 10*3/uL (ref 0.0–0.5)
Eosinophils Relative: 4 %
Immature Granulocytes: 0 %
Lymphocytes Relative: 18 %
Lymphs Abs: 1.1 10*3/uL (ref 0.7–4.0)
Monocytes Absolute: 0.4 10*3/uL (ref 0.1–1.0)
Monocytes Relative: 7 %
Neutro Abs: 4.2 10*3/uL (ref 1.7–7.7)
Neutrophils Relative %: 70 %

## 2021-07-03 LAB — RAPID URINE DRUG SCREEN, HOSP PERFORMED
Amphetamines: NOT DETECTED
Barbiturates: NOT DETECTED
Benzodiazepines: NOT DETECTED
Cocaine: NOT DETECTED
Opiates: NOT DETECTED
Tetrahydrocannabinol: NOT DETECTED

## 2021-07-03 LAB — COMPREHENSIVE METABOLIC PANEL
ALT: 25 U/L (ref 0–44)
AST: 27 U/L (ref 15–41)
Albumin: 3.4 g/dL — ABNORMAL LOW (ref 3.5–5.0)
Alkaline Phosphatase: 88 U/L (ref 38–126)
Anion gap: 8 (ref 5–15)
BUN: 31 mg/dL — ABNORMAL HIGH (ref 8–23)
CO2: 20 mmol/L — ABNORMAL LOW (ref 22–32)
Calcium: 8.8 mg/dL — ABNORMAL LOW (ref 8.9–10.3)
Chloride: 109 mmol/L (ref 98–111)
Creatinine, Ser: 2.52 mg/dL — ABNORMAL HIGH (ref 0.61–1.24)
GFR, Estimated: 28 mL/min — ABNORMAL LOW (ref >60)
Glucose, Bld: 184 mg/dL — ABNORMAL HIGH (ref 70–99)
Potassium: 4.4 mmol/L (ref 3.5–5.1)
Sodium: 137 mmol/L (ref 135–145)
Total Bilirubin: 1.4 mg/dL — ABNORMAL HIGH (ref 0.3–1.2)
Total Protein: 7.6 g/dL (ref 6.5–8.1)

## 2021-07-03 LAB — CBG MONITORING, ED: Glucose-Capillary: 191 mg/dL — ABNORMAL HIGH (ref 70–99)

## 2021-07-03 LAB — I-STAT CHEM 8, ED
BUN: 44 mg/dL — ABNORMAL HIGH (ref 8–23)
Calcium, Ion: 1.13 mmol/L — ABNORMAL LOW (ref 1.15–1.40)
Chloride: 107 mmol/L (ref 98–111)
Creatinine, Ser: 2.8 mg/dL — ABNORMAL HIGH (ref 0.61–1.24)
Glucose, Bld: 181 mg/dL — ABNORMAL HIGH (ref 70–99)
HCT: 33 % — ABNORMAL LOW (ref 39.0–52.0)
Hemoglobin: 11.2 g/dL — ABNORMAL LOW (ref 13.0–17.0)
Potassium: 4.5 mmol/L (ref 3.5–5.1)
Sodium: 141 mmol/L (ref 135–145)
TCO2: 22 mmol/L (ref 22–32)

## 2021-07-03 LAB — CBC
HCT: 32.9 % — ABNORMAL LOW (ref 39.0–52.0)
Hemoglobin: 11.1 g/dL — ABNORMAL LOW (ref 13.0–17.0)
MCH: 30.2 pg (ref 26.0–34.0)
MCHC: 33.7 g/dL (ref 30.0–36.0)
MCV: 89.4 fL (ref 80.0–100.0)
Platelets: 245 10*3/uL (ref 150–400)
RBC: 3.68 MIL/uL — ABNORMAL LOW (ref 4.22–5.81)
RDW: 14.8 % (ref 11.5–15.5)
WBC: 6 10*3/uL (ref 4.0–10.5)
nRBC: 0 % (ref 0.0–0.2)

## 2021-07-03 LAB — PROTIME-INR
INR: 1 (ref 0.8–1.2)
Prothrombin Time: 13.4 seconds (ref 11.4–15.2)

## 2021-07-03 LAB — APTT: aPTT: 31 seconds (ref 24–36)

## 2021-07-03 LAB — MRSA NEXT GEN BY PCR, NASAL: MRSA by PCR Next Gen: NOT DETECTED

## 2021-07-03 LAB — RESP PANEL BY RT-PCR (FLU A&B, COVID) ARPGX2
Influenza A by PCR: NEGATIVE
Influenza B by PCR: NEGATIVE
SARS Coronavirus 2 by RT PCR: NEGATIVE

## 2021-07-03 LAB — ETHANOL: Alcohol, Ethyl (B): <10 mg/dL (ref <10)

## 2021-07-03 LAB — GLUCOSE, CAPILLARY: Glucose-Capillary: 161 mg/dL — ABNORMAL HIGH (ref 70–99)

## 2021-07-03 MED ORDER — TRAZODONE HCL 50 MG PO TABS: 50.0000 mg | ORAL_TABLET | Freq: Every evening | ORAL | Status: DC | PRN

## 2021-07-03 MED ORDER — INSULIN GLARGINE-YFGN 100 UNIT/ML ~~LOC~~ SOLN
40.0000 [IU] | Freq: Every day | SUBCUTANEOUS | Status: DC
  Administered 2021-07-03: 40 [IU] via SUBCUTANEOUS
  Filled 2021-07-03 (×4): qty 0.4

## 2021-07-03 MED ORDER — INSULIN ASPART 100 UNIT/ML IJ SOLN: 0.0000 [IU] | INTRAMUSCULAR | Status: DC

## 2021-07-03 MED ORDER — GABAPENTIN 300 MG PO CAPS
300.0000 mg | ORAL_CAPSULE | Freq: Three times a day (TID) | ORAL | Status: DC
  Administered 2021-07-03 – 2021-07-05 (×6): 300 mg via ORAL
  Filled 2021-07-03 (×6): qty 1

## 2021-07-03 MED ORDER — NORTRIPTYLINE HCL 25 MG PO CAPS
25.0000 mg | ORAL_CAPSULE | Freq: Every day | ORAL | Status: DC
  Administered 2021-07-03 – 2021-07-04 (×2): 25 mg via ORAL
  Filled 2021-07-03 (×3): qty 1

## 2021-07-03 MED ORDER — ESCITALOPRAM OXALATE 10 MG PO TABS
20.0000 mg | ORAL_TABLET | Freq: Every day | ORAL | Status: DC
  Administered 2021-07-03 – 2021-07-05 (×3): 20 mg via ORAL
  Filled 2021-07-03 (×3): qty 2

## 2021-07-03 MED ORDER — CLEVIDIPINE BUTYRATE 0.5 MG/ML IV EMUL: 0.0000 mg/h | INTRAVENOUS | Status: DC

## 2021-07-03 MED ORDER — PANTOPRAZOLE SODIUM 40 MG PO TBEC
40.0000 mg | DELAYED_RELEASE_TABLET | Freq: Every day | ORAL | Status: DC
  Administered 2021-07-03 – 2021-07-05 (×3): 40 mg via ORAL
  Filled 2021-07-03 (×3): qty 1

## 2021-07-03 MED ORDER — ORAL CARE MOUTH RINSE: 15.0000 mL | OROMUCOSAL | Status: DC | PRN

## 2021-07-03 MED ORDER — ATORVASTATIN CALCIUM 40 MG PO TABS
40.0000 mg | ORAL_TABLET | Freq: Every day | ORAL | Status: DC
  Administered 2021-07-03 – 2021-07-04 (×2): 40 mg via ORAL
  Filled 2021-07-03 (×2): qty 1

## 2021-07-03 MED ORDER — INSULIN ASPART 100 UNIT/ML IJ SOLN: 0.0000 [IU] | Freq: Three times a day (TID) | INTRAMUSCULAR | Status: DC

## 2021-07-03 MED ORDER — SODIUM CHLORIDE 0.9 % IV SOLN: INTRAVENOUS | Status: DC

## 2021-07-03 MED ORDER — SENNOSIDES-DOCUSATE SODIUM 8.6-50 MG PO TABS: 1.0000 | ORAL_TABLET | Freq: Every evening | ORAL | Status: DC | PRN

## 2021-07-03 MED ORDER — CHLORHEXIDINE GLUCONATE CLOTH 2 % EX PADS: 6.0000 | MEDICATED_PAD | Freq: Every day | CUTANEOUS | Status: DC

## 2021-07-03 MED ORDER — FINASTERIDE 5 MG PO TABS
5.0000 mg | ORAL_TABLET | Freq: Every day | ORAL | Status: DC
  Administered 2021-07-04 – 2021-07-05 (×2): 5 mg via ORAL
  Filled 2021-07-03 (×3): qty 1

## 2021-07-03 MED ORDER — STROKE: EARLY STAGES OF RECOVERY BOOK: Freq: Once | Status: DC

## 2021-07-03 MED ORDER — LABETALOL HCL 5 MG/ML IV SOLN
INTRAVENOUS | Status: AC
  Administered 2021-07-03: 10 mg
  Filled 2021-07-03: qty 4

## 2021-07-03 MED ORDER — PANTOPRAZOLE SODIUM 40 MG IV SOLR: 40.0000 mg | Freq: Every day | INTRAVENOUS | Status: DC

## 2021-07-03 MED ORDER — CLEVIDIPINE BUTYRATE 0.5 MG/ML IV EMUL
INTRAVENOUS | Status: AC
  Administered 2021-07-03: 1 mg/h via INTRAVENOUS
  Filled 2021-07-03: qty 50

## 2021-07-03 MED ORDER — ACETAMINOPHEN 325 MG PO TABS
650.0000 mg | ORAL_TABLET | ORAL | Status: DC | PRN
  Administered 2021-07-03: 650 mg via ORAL
  Filled 2021-07-03: qty 2

## 2021-07-03 MED ORDER — GLIPIZIDE 5 MG PO TABS
5.0000 mg | ORAL_TABLET | Freq: Every day | ORAL | Status: DC
Start: 2021-07-03 — End: 2021-07-05
  Administered 2021-07-03 – 2021-07-05 (×3): 5 mg via ORAL
  Filled 2021-07-03 (×4): qty 1

## 2021-07-03 MED ORDER — ACETAMINOPHEN 160 MG/5ML PO SOLN: 650.0000 mg | ORAL | Status: DC | PRN

## 2021-07-03 MED ORDER — TENECTEPLASE FOR STROKE
0.2500 mg/kg | PACK | Freq: Once | INTRAVENOUS | Status: AC
Start: 2021-07-03 — End: 2021-07-03
  Administered 2021-07-03: 24 mg via INTRAVENOUS
  Filled 2021-07-03: qty 10

## 2021-07-03 MED ORDER — ACETAMINOPHEN 650 MG RE SUPP: 650.0000 mg | RECTAL | Status: DC | PRN

## 2021-07-03 MED ORDER — TAMSULOSIN HCL 0.4 MG PO CAPS
0.4000 mg | ORAL_CAPSULE | Freq: Every day | ORAL | Status: DC
  Administered 2021-07-03 – 2021-07-04 (×2): 0.4 mg via ORAL
  Filled 2021-07-03 (×2): qty 1

## 2021-07-03 NOTE — Code Documentation (Signed)
Patient complaint of posterior head and neck pain that started at this time. Reports 5 out of 10, sharp. MD Quinn Axe made aware and patient being taken to CT.

## 2021-07-03 NOTE — H&P (Addendum)
NEUROLOGY H&P   Date of service: July 03, 2021 Patient Name: Ryan Jenkins MRN:  [id number] DOB:  [birth year] Reason for consult: stroke code Requesting physician: Dr. 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  Last known well 920 this morning.  Since that time room has been spinning/he feels like he is moving when he is not.  He has vomited multiple times.  NIH stroke scale was 1 for mild dysarthria however he did have significant and persistent vertigo.  CT head showed no acute intracranial process.  Risks and benefits of TNK were discussed and patient elected to proceed.  There was a delay in TNK due to blood pressure control.  He was ultimately controlled with IV labetalol and he was administered thrombolytic.  Patient has severe CKD with a creatinine of 3.8 last month in clinic therefore CTA CTP was not performed as part of the stroke code.  His exam was not consistent with large vessel occlusion.  Approximately 90 minutes after TNK was administered patient complained of a slight worsening of the headache that developed this morning (initially 3 out of 5, now 5 out of 5 and radiating down his neck).  His neurologic exam was unchanged.  Repeat CT head showed no evidence of bleeding.  CNS imaging was personally reviewed.  His symptoms began when he was in the urology clinic.  He has BPH and has had an indwelling Foley for 2 weeks as an outpatient.  He was recently admitted for pneumonia and heart failure exacerbation 2 weeks ago.    ROS   Per HPI: all other systems reviewed and are  negative  Past History   I have reviewed the following:  Past Medical History:  Diagnosis Date   Anemia    Anxiety    BPH (benign prostatic hyperplasia)    CAD (coronary artery disease)    Chronic diastolic CHF (congestive heart failure) (HCC)    Chronic kidney disease, stage IV (severe) ([place])    Depression    Diabetes mellitus with complication ([place])    Diabetic neuropathy ([place])    GERD (gastroesophageal reflux disease)    Hypertension    Retinopathy due to secondary diabetes ([place])    Sleep disturbances 11/25/2018   Vision changes 11/25/2018   Vitamin B12 deficiency    Vitreous hemorrhage (HCC)    Past Surgical History:  Procedure Laterality Date   CATARACT EXTRACTION Bilateral 2017   Family History  Problem Relation Age of Onset   Renal cancer Mother    Hypertension Mother    Pancreatic cancer Mother    Hypertension Sister    Stroke Sister    Leukemia Maternal Uncle    Sickle cell trait Maternal Aunt    Colon cancer Neg Hx    Esophageal cancer Neg Hx    Rectal cancer Neg Hx    Stomach cancer Neg Hx    Social History   Socioeconomic History   Marital status: Divorced    Spouse name: Not on file   Number of children: 1   Years  of education: 12   Highest education level: Not on file  Occupational History   Occupation: unemployed    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, Editor, commissioning  Tobacco Use   Smoking status: Never   Smokeless tobacco: Never  Vaping Use   Vaping Use: Never used  Substance and Sexual Activity   Alcohol use: No    Alcohol/week: 0.0 standard drinks of alcohol   Drug use: No   Sexual activity: Yes  Other Topics Concern   Not on file  Social History Narrative   Not on file   Social Determinants of Health   Financial Resource Strain: Not on file  Food Insecurity: Not on file  Transportation Needs: Not on file  Physical Activity: Not on file  Stress: Not on file  Social Connections: Not on file   Allergies  Allergen Reactions   Claritin  [Loratadine] Swelling    Joint swelling    Hydrochlorothiazide Other (See Comments)    Dizziness   Latex Hives   Lyrica [Pregabalin] Other (See Comments)    depression   Metformin And Related Other (See Comments)    GI    Medications   (Not in a hospital admission)    No current facility-administered medications for this encounter.  Current Outpatient Medications:    acetaminophen (TYLENOL) 500 MG tablet, Take 1,000 mg by mouth every 6 (six) hours as needed for mild pain or headache., Disp: , Rfl:    amLODipine (NORVASC) 10 MG tablet, Take 1 tablet (10 mg total) by mouth daily., Disp: 30 tablet, Rfl: 6   aspirin EC 81 MG EC tablet, Take 1 tablet (81 mg total) by mouth daily. Swallow whole., Disp: 30 tablet, Rfl: 11   atorvastatin (LIPITOR) 40 MG tablet, Take 1 tablet (40 mg total) by mouth daily., Disp: 30 tablet, Rfl: 6   Blood Glucose Monitoring Suppl (TRUE METRIX METER) w/Device KIT, Use as directed, Disp: 1 kit, Rfl: 0   Blood Pressure Monitor DEVI, Use as directed to check home blood pressure 2-3 times a week, Disp: 1 each, Rfl: 0   D-Mannose 350 MG CAPS, Take 1,050 capsules by mouth in the morning and at bedtime., Disp: , Rfl:    escitalopram (LEXAPRO) 20 MG tablet, Take 1 tablet (20 mg total) by mouth daily., Disp: 30 tablet, Rfl: 3   finasteride (PROSCAR) 5 MG tablet, Take 1 tablet (5 mg total) by mouth daily., Disp: 30 tablet, Rfl: 2   furosemide (LASIX) 40 MG tablet, Take 1 tablet (40 mg total) by mouth 2 (two) times daily., Disp: 60 tablet, Rfl: 6   gabapentin (NEURONTIN) 300 MG capsule, Take 300 mg by mouth 3 (three) times daily., Disp: , Rfl:    glipiZIDE (GLUCOTROL) 5 MG tablet, Take 5 mg by mouth daily., Disp: , Rfl:    glucose blood (TRUE METRIX BLOOD GLUCOSE TEST) test strip, Use as instructed, Disp: 100 each, Rfl: 12   hydrALAZINE (APRESOLINE) 100 MG tablet, Take 1 tablet (100 mg total) by mouth 3 (three) times daily., Disp: 90 tablet, Rfl: 6   Insulin  Syringe-Needle U-100 (TRUEPLUS INSULIN SYRINGE) 31G X 5/16" 0.3 ML MISC, Use to inject Levemir at bedtime., Disp: 100 each, Rfl: 0   LANTUS SOLOSTAR 100 UNIT/ML Solostar Pen, Inject 40 Units into the skin at bedtime., Disp: , Rfl:    lisinopril (ZESTRIL) 10 MG tablet, Take 10 mg by mouth daily., Disp: , Rfl:    metoprolol succinate (TOPROL-XL) 50 MG 24 hr tablet, Take 1 tablet (50 mg total)  by mouth at bedtime. Take with or immediately following a meal., Disp: 30 tablet, Rfl: 6   nortriptyline (PAMELOR) 25 MG capsule, Take 1 capsule (25 mg total) by mouth at bedtime., Disp: 30 capsule, Rfl: 2   pantoprazole (PROTONIX) 40 MG tablet, Take 1 tablet (40 mg total) by mouth daily., Disp: 30 tablet, Rfl: 6   potassium chloride (KLOR-CON M) 10 MEQ tablet, Take 1 tablet (10 mEq total) by mouth daily., Disp: 30 tablet, Rfl: 3   tamsulosin (FLOMAX) 0.4 MG CAPS capsule, Take 0.4 mg by mouth at bedtime., Disp: , Rfl:    traZODone (DESYREL) 50 MG tablet, Take 3 tablets (150 mg total) by mouth at bedtime. (Patient taking differently: Take 150 mg by mouth at bedtime as needed for sleep.), Disp: 90 tablet, Rfl: 2   TRUEplus Lancets 28G MISC, Use as directed, Disp: 100 each, Rfl: 4  Vitals   Vitals:   07/03/21 1059 07/03/21 1112 07/03/21 1114  BP:  (!) 171/105   Pulse:  80   Resp:  16   SpO2: 100% 100%   Weight:   95.3 kg  Height:   _0  (1.88 m)     Body mass index is 26.96 kg/m.  Physical Exam   Physical Exam Gen: A&O x4, NAD HEENT: Atraumatic, normocephalic;mucous membranes moist; oropharynx clear, tongue without atrophy or fasciculations. Neck: Supple, trachea midline. Resp: CTAB, no w/r/r CV: RRR, no m/g/r; nml S1 and S2. 2+ symmetric peripheral pulses. Abd: soft/NT/ND; nabs x 4 quad Extrem: Nml bulk; no cyanosis, clubbing, or edema.  Neuro: *MS: A&O x4. Follows multi-step commands.  *Speech: fluid, mild dysarthria, able to name and repeat *CN:    I: Deferred   II,III: PERRLA, VFF by  confrontation on L, chronically blind in R eye, optic discs unable to be visualized 2/2 pupillary constriction   III,IV,VI: EOMI w/o nystagmus, no ptosis   V: Sensation intact from V1 to V3 to LT   VII: Eyelid closure was full.  Smile symmetric.   VIII: Hearing intact to voice   IX,X: Voice normal, palate elevates symmetrically    XI: SCM/trap 5/5 bilat   XII: Tongue protrudes midline, no atrophy or fasciculations  *Motor:   Normal bulk.  No tremor, rigidity or bradykinesia. Full strength BUE and RLE; 4+/5 LLE but able to hold anti-gravity without drift *Sensory: Intact to light touch, pinprick, temperature vibration throughout. Symmetric. Propioception intact bilat.  No double-simultaneous extinction.  *Coordination:  Dysmetria on FNF bilat without frank ataxia, HTS intact bilat. *Reflexes:  1+ and symmetric throughout without clonus; toes down-going bilat *Gait: deferred  NIHSS = 1 for dysarthria   Premorbid mRS = 0   Labs   CBC: No results for input(s): "WBC", "NEUTROABS", "HGB", "HCT", "MCV", "PLT" in the last 168 hours.  Basic Metabolic Panel:  Lab Results  Component Value Date   NA 136 06/13/2021   K 4.3 06/13/2021   CO2 20 (L) 06/13/2021   GLUCOSE 98 06/13/2021   BUN 70 (H) 06/13/2021   CREATININE 3.83 (H) 06/13/2021   CALCIUM 8.3 (L) 06/13/2021   GFRNONAA 17 (L) 06/13/2021   GFRAA 45 (L) 08/07/2019   Lipid Panel:  Lab Results  Component Value Date   LDLCALC 101 (H) 02/25/2018   HgbA1c:  Lab Results  Component Value Date   HGBA1C 7.1 (H) 06/05/2021   Urine Drug Screen: No results found for: "LABOPIA", "COCAINSCRNUR", "LABBENZ", "AMPHETMU", "THCU", "LABBARB"  Alcohol Level No results found for: "ETH"   Assessment  This is a 61 year old gentleman with past medical history significant for CAD, diastolic heart failure, CKD stage IV diabetes on insulin, diabetic neuropathy, hypertension, 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 Given multiple cerebrovascular risk factors and persistence of vertigo which is not position dependent, primary concern if for acute ischemic posterior circulation infarct, less likely peripheral. He received TNK and will be admitted to neuro ICU for close monitoring, BP mgmt, and stroke workup.   Plan   # Vertigo with nausea and vomiting, acute # Acute neurologic deficits, stroke suspected # S/p TNK - Admit to [facility] ICU under Dr. Quinn Axe - Routine post Thrombolytic monitoring including neuro checks and blood pressure control during/after treatment  - Manage Blood Pressure per post Thrombolytic protocol. Avoid oral antihypertensives. Labetalol IV or clevidipine gtt prn to maintain goal BP <180/105 - MRI brain wo - MRA H&N wo - CT brain 24 hours post Thrombolytic - NPO until swallowing screen performed and passed - No antiplatelet agents or anticoagulants (including heparin for DVT prophylaxis) in first 24 hours - SCDs - No insertion of foley catheter, nasogastric tube, arterial catheter or central venous catheter for 24 hr, unless absolutely necessary - Telemetry - TTE performed within past 30 days, no need to repeat - Check A1c and LDL + add statin per guidelines; continue atorvastatin 50m daily for now - q4 hr neuro checks - STAT head CT for any change in neuro exam - PT/OT - Stroke education - Amb referral to neurology upon discharge  - Stroke team will assume care tomorrow.   # HTN - Hold oral antihypertensives in setting of suspected acute ischemic stroke - BP mgmt per above  # DM2 - Continue home equivalent glargine 40 units whs (lantus not on formulary) - Continue home glipizide - Accuchecks + SSI achs  # Diastolic HF - Hold home lasix in setting of permissive HTN in the setting of suspected acute ischemic stroke - Monitor I/O, may need to  restart if retaining  # CKD stage IV - Avoid nephrotoxic medications - Trend BMP  # BPH - Patient had indwelling foley placed 2 wks ago as outpatient - Continue home flomax, proscar  # Diabetic neuropathy - Continue home gabapentin, nortriptyline  # Depression - Continue home lexapro, nortriptyline  # Insomnia - Continue home trazodone qhs prn   This patient is critically ill and at significant risk of neurological worsening, death and care requires constant monitoring of vital signs, hemodynamics,respiratory and cardiac monitoring, neurological assessment, discussion with family, other specialists and medical decision making of high complexity. I spent 110 minutes of neurocritical care time  in the care of  this patient. This was time spent independent of any time provided by nurse practitioner or PA.  CSu Monks MD [facility] [phone] If 7pm- 7am, please page neurology on call as listed in [place]

## 2021-07-03 NOTE — Code Documentation (Addendum)
Stroke Response Nurse Documentation Code Documentation  Ryan Jenkins is a 61 y.o. male arriving to [facility]  via [facility] on 07/03/2021 with past medical hx of CKD, Diabetes, HTN. Code stroke was activated by ED.   Patient from Urology Office where he was LKW at [place] and now complaining of dizziness and N/V. Pt went for urology appt due to indwelling catheter that was placed due to enlarged prostate  While at the office, the MD laid the patient down to do exam and he started to have sudden onset of dizziness with secondary Nausea and then vomiting. IM Phenergan given at the MD office and EMS called. Pt transported to [facility].   After arrival, EDP and nurse activated a Code Stroke. Pt reports using walker at home and having sitter help with daily chores at the house.   Stroke team at the bedside on patient arrival. Labs drawn and patient cleared for CT by Dr. Armandina Gemma. Patient to CT with team. NIHSS 3, see documentation for details and code stroke times. Patient with right hemianopia and dysarthria  on exam. The following imaging was completed:  CT Head. Patient is a candidate for IV Thrombolytic due to sudden onset of dizziness. Patient is not a candidate for IR due to no LVO suspected by provider.   Care Plan: Post-TNK protocol- q15 x 2 hours NIHSS/VS, q30 x 6 hours NIHSS/VS, q1 H x 16 hours.   Bedside handoff with ED RN [place].    Kathrin Greathouse  Stroke Response RN

## 2021-07-03 NOTE — Progress Notes (Signed)
Patient arrived to [address] with clothing, shoes, wallet ($20 cash, some credit cards), cell phone, glasses.

## 2021-07-03 NOTE — Plan of Care (Signed)
UA c/f possible UTI but patient has indwelling catheter. Recommend touching base with urology tmrw to see if they recommend tx or catheter exchange and then repeat testing. Urology would need to do catheter exchange, patient has severe BPH and chronic indwelling foley placed 2 wks ago. Catheter should not be exchanged until at least 24 hrs post TNK.  Su Monks, MD [facility] [phone]  If 7pm- 7am, please page neurology on call as listed in [facility].

## 2021-07-03 NOTE — ED Notes (Signed)
TNK administered at this time by Jarrett Soho, RN

## 2021-07-03 NOTE — Code Documentation (Signed)
Report given to Leonides Sake, [employer].

## 2021-07-03 NOTE — ED Provider Notes (Signed)
[facility] [facility] EMERGENCY DEPARTMENT Provider Note   CSN: [id number] Arrival date & time: 07/03/21  1052  An emergency department physician performed an initial assessment on this suspected stroke patient at 1059.  History  Chief Complaint  Patient presents with   Dizziness    Ryan Jenkins is a 61 y.o. male.   Dizziness   61 year old male with medical history significant for DM 2, HTN, diabetic retinopathy, anxiety, depression, GERD, CAD, CHF, CKD, presenting to the emergency department with acute onset vertiginous symptoms.  The patient states that he was in his urology office with a complaint of sudden onset dizziness and slurred speech around 0 920.  The slurred speech is since resolved but the dizziness remains.  The patient endorses room spinning dizziness and an inability to ambulate or sit up due to the persistent room spinning.  He denies any facial droop, numbness or weakness.  Code stroke initiated on patient arrival.  Home Medications Prior to Admission medications   Medication Sig Start Date End Date Taking? Authorizing Provider  acetaminophen (TYLENOL) 500 MG tablet Take 1,000 mg by mouth every 6 (six) hours as needed for mild pain or headache.   Yes [provider]  amLODipine (NORVASC) 10 MG tablet Take 1 tablet (10 mg total) by mouth daily. 02/08/21  Yes Danford, Suann Larry, MD  aspirin EC 81 MG EC tablet Take 1 tablet (81 mg total) by mouth daily. Swallow whole. 02/09/21  Yes Danford, Suann Larry, MD  atorvastatin (LIPITOR) 40 MG tablet Take 1 tablet (40 mg total) by mouth daily. 02/08/21  Yes Danford, Suann Larry, MD  Blood Glucose Monitoring Suppl (TRUE METRIX METER) w/Device KIT Use as directed 07/20/20  Yes Ladell Pier, MD  Blood Pressure Monitor DEVI Use as directed to check home blood pressure 2-3 times a week 07/13/20  Yes Minette Brine, Amy J, NP  D-Mannose 350 MG CAPS Take 1,050 capsules by mouth in the morning and at bedtime.    Yes [provider]  escitalopram (LEXAPRO) 20 MG tablet Take 1 tablet (20 mg total) by mouth daily. 07/20/20  Yes Charlott Rakes, MD  finasteride (PROSCAR) 5 MG tablet Take 1 tablet (5 mg total) by mouth daily. 07/20/20  Yes Charlott Rakes, MD  furosemide (LASIX) 40 MG tablet Take 1 tablet (40 mg total) by mouth 2 (two) times daily. 02/08/21  Yes Danford, Suann Larry, MD  gabapentin (NEURONTIN) 300 MG capsule Take 300 mg by mouth 3 (three) times daily. 05/26/21  Yes [provider]  glipiZIDE (GLUCOTROL) 5 MG tablet Take 5 mg by mouth daily. 05/25/21  Yes [provider]  glucose blood (TRUE METRIX BLOOD GLUCOSE TEST) test strip Use as instructed 07/20/20  Yes Ladell Pier, MD  hydrALAZINE (APRESOLINE) 100 MG tablet Take 1 tablet (100 mg total) by mouth 3 (three) times daily. 02/08/21  Yes Danford, Suann Larry, MD  Insulin Syringe-Needle U-100 (TRUEPLUS INSULIN SYRINGE) 31G X 5/16" 0.3 ML MISC Use to inject Levemir at bedtime. 07/20/20  Yes Newlin, Charlane Ferretti, MD  LANTUS SOLOSTAR 100 UNIT/ML Solostar Pen Inject 40 Units into the skin at bedtime. 05/25/21  Yes [provider]  lisinopril (ZESTRIL) 10 MG tablet Take 10 mg by mouth daily. 04/24/21  Yes [provider]  metoprolol succinate (TOPROL-XL) 50 MG 24 hr tablet Take 1 tablet (50 mg total) by mouth at bedtime. Take with or immediately following a meal. 02/08/21  Yes Danford, Suann Larry, MD  nortriptyline (PAMELOR) 25 MG capsule Take  1 capsule (25 mg total) by mouth at bedtime. 07/20/20  Yes Charlott Rakes, MD  pantoprazole (PROTONIX) 40 MG tablet Take 1 tablet (40 mg total) by mouth daily. 02/08/21  Yes Danford, Suann Larry, MD  potassium chloride (KLOR-CON M) 10 MEQ tablet Take 1 tablet (10 mEq total) by mouth daily. 02/08/21  Yes Danford, Suann Larry, MD  tamsulosin (FLOMAX) 0.4 MG CAPS capsule Take 0.4 mg by mouth at bedtime. 05/25/21  Yes [provider]  traZODone (DESYREL) 50 MG tablet  Take 3 tablets (150 mg total) by mouth at bedtime. Patient taking differently: Take 50 mg by mouth at bedtime as needed for sleep. 07/20/20  Yes Charlott Rakes, MD  TRUEplus Lancets 28G MISC Use as directed 07/20/20  Yes Ladell Pier, MD      Allergies    Claritin [loratadine], Hydrochlorothiazide, Latex, Lyrica [pregabalin], and Metformin and related    Review of Systems   Review of Systems  Neurological:  Positive for dizziness.    Physical Exam Updated Vital Signs BP 128/69   Pulse 92   Temp 97.6 F (36.4 C) (Oral)   Resp 17   Ht 6' 2"  (1.88 m)   Wt 95.3 kg   SpO2 95%   BMI 26.96 kg/m  Physical Exam Vitals and nursing note reviewed.  Constitutional:      General: He is in acute distress.     Appearance: He is well-developed.  HENT:     Head: Normocephalic and atraumatic.  Eyes:     Conjunctiva/sclera: Conjunctivae normal.  Cardiovascular:     Rate and Rhythm: Normal rate and regular rhythm.  Pulmonary:     Effort: Pulmonary effort is normal. No respiratory distress.     Breath sounds: Normal breath sounds.  Abdominal:     Palpations: Abdomen is soft.     Tenderness: There is no abdominal tenderness.  Musculoskeletal:        General: No swelling.     Cervical back: Neck supple.  Skin:    General: Skin is warm and dry.     Capillary Refill: Capillary refill takes less than 2 seconds.  Neurological:     Mental Status: He is alert.     Comments: MENTAL STATUS EXAM:    Orientation: Alert and oriented to person, place and time.  Memory: Cooperative, follows commands well.  Language: Speech is with mild dysarthria  CRANIAL NERVES:    CN 2 (Optic): Visual fields intact to confrontation.  CN 3,4,6 (EOM): Pupils equal and reactive to light. Full extraocular eye movement, no nystagmus CN 5 (Trigeminal): Facial sensation is normal, no weakness of masticatory muscles.  CN 7 (Facial): No facial weakness or asymmetry.  CN 8 (Auditory): Auditory acuity grossly  normal.  CN 9,10 (Glossophar): The uvula is midline, the palate elevates symmetrically.  CN 11 (spinal access): Normal sternocleidomastoid and trapezius strength.  CN 12 (Hypoglossal): The tongue is midline. No atrophy or fasciculations.[name]   MOTOR:  Muscle Strength: 5/5RUE, 5/5LUE, 5/5RLE, 5/5LLE.   COORDINATION:   Mild dysmetria present bilaterally.   SENSATION:   Intact to light touch all four extremities.   Psychiatric:        Mood and Affect: Mood normal.     ED Results / Procedures / Treatments   Labs (all labs ordered are listed, but only abnormal results are displayed) Labs Reviewed  CBC - Abnormal; Notable for the following components:      Result Value   RBC 3.68 (*)    Hemoglobin  11.1 (*)    HCT 32.9 (*)    All other components within normal limits  COMPREHENSIVE METABOLIC PANEL - Abnormal; Notable for the following components:   CO2 20 (*)    Glucose, Bld 184 (*)    BUN 31 (*)    Creatinine, Ser 2.52 (*)    Calcium 8.8 (*)    Albumin 3.4 (*)    Total Bilirubin 1.4 (*)    GFR, Estimated 28 (*)    All other components within normal limits  CBG MONITORING, ED - Abnormal; Notable for the following components:   Glucose-Capillary 191 (*)    All other components within normal limits  I-STAT CHEM 8, ED - Abnormal; Notable for the following components:   BUN 44 (*)    Creatinine, Ser 2.80 (*)    Glucose, Bld 181 (*)    Calcium, Ion 1.13 (*)    Hemoglobin 11.2 (*)    HCT 33.0 (*)    All other components within normal limits  RESP PANEL BY RT-PCR (FLU A&B, COVID) ARPGX2  MRSA NEXT GEN BY PCR, NASAL  ETHANOL  PROTIME-INR  APTT  DIFFERENTIAL  RAPID URINE DRUG SCREEN, HOSP PERFORMED  URINALYSIS, ROUTINE W REFLEX MICROSCOPIC    EKG None  Radiology MR ANGIO HEAD WO CONTRAST  Result Date: 07/03/2021 CLINICAL DATA:  Neuro deficit, acute, stroke suspected acute onset vertigo; Neuro deficit, acute, stroke suspected EXAM: MRI HEAD WITHOUT CONTRAST MRA HEAD WITHOUT  CONTRAST MRA NECK WITHOUT CONTRAST TECHNIQUE: Multiplanar, multiecho pulse sequences of the brain and surrounding structures were obtained without intravenous contrast. Angiographic images of the Circle of Willis were obtained using MRA technique without intravenous contrast. Angiographic images of the neck were obtained using MRA technique without intravenous contrast. Carotid stenosis measurements (when applicable) are obtained utilizing NASCET criteria, using the distal internal carotid diameter as the denominator. COMPARISON:  None Available. FINDINGS: MRI HEAD Motion artifact is present. Brain: There is no acute infarction or intracranial hemorrhage. There is no intracranial mass, mass effect, or edema. There is no hydrocephalus or extra-axial fluid collection. Prominence of the ventricles and sulci reflects minor parenchymal volume loss. Patchy T2 hyperintensity in the supratentorial white matter is nonspecific but may reflect minor chronic microvascular ischemic changes. Vascular: Major vessel flow voids at the skull base are preserved. Skull and upper cervical spine: Normal marrow signal is preserved. Sinuses/Orbits: Paranasal sinuses are aerated. Orbits are unremarkable. Other: Sella is unremarkable.  Mastoid air cells are clear. MRA HEAD Motion artifact is present. Intracranial internal carotid arteries are patent. Middle and anterior cerebral arteries are patent. 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 Intracranial vertebral arteries, basilar artery, posterior cerebral arteries are patent. There is no significant stenosis or aneurysm. MRA NECK Motion artifact is present. Common, internal, and external carotid arteries are patent. Extracranial vertebral arteries are patent. Right vertebral is dominant. No hemodynamically significant stenosis. IMPRESSION: Motion degraded. No acute infarction, hemorrhage, or mass. Minor chronic microvascular ischemic changes. No large vessel occlusion or  hemodynamically significant stenosis. Electronically Signed   By: Macy Mis M.D.   On: 07/03/2021 14:56   MR ANGIO NECK WO CONTRAST  Result Date: 07/03/2021 CLINICAL DATA:  Neuro deficit, acute, stroke suspected acute onset vertigo; Neuro deficit, acute, stroke suspected EXAM: MRI HEAD WITHOUT CONTRAST MRA HEAD WITHOUT CONTRAST MRA NECK WITHOUT CONTRAST TECHNIQUE: Multiplanar, multiecho pulse sequences of the brain and surrounding structures were obtained without intravenous contrast. Angiographic images of the Circle of Willis were obtained using MRA technique without  intravenous contrast. Angiographic images of the neck were obtained using MRA technique without intravenous contrast. Carotid stenosis measurements (when applicable) are obtained utilizing NASCET criteria, using the distal internal carotid diameter as the denominator. COMPARISON:  None Available. FINDINGS: MRI HEAD Motion artifact is present. Brain: There is no acute infarction or intracranial hemorrhage. There is no intracranial mass, mass effect, or edema. There is no hydrocephalus or extra-axial fluid collection. Prominence of the ventricles and sulci reflects minor parenchymal volume loss. Patchy T2 hyperintensity in the supratentorial white matter is nonspecific but may reflect minor chronic microvascular ischemic changes. Vascular: Major vessel flow voids at the skull base are preserved. Skull and upper cervical spine: Normal marrow signal is preserved. Sinuses/Orbits: Paranasal sinuses are aerated. Orbits are unremarkable. Other: Sella is unremarkable.  Mastoid air cells are clear. MRA HEAD Motion artifact is present. Intracranial internal carotid arteries are patent. Middle and anterior cerebral arteries are patent. 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 Intracranial vertebral arteries, basilar artery, posterior cerebral arteries are patent. There is no significant stenosis or aneurysm. MRA NECK Motion artifact is present.  Common, internal, and external carotid arteries are patent. Extracranial vertebral arteries are patent. Right vertebral is dominant. No hemodynamically significant stenosis. IMPRESSION: Motion degraded. No acute infarction, hemorrhage, or mass. Minor chronic microvascular ischemic changes. No large vessel occlusion or hemodynamically significant stenosis. Electronically Signed   By: Macy Mis M.D.   On: 07/03/2021 14:56   MR BRAIN WO CONTRAST  Result Date: 07/03/2021 CLINICAL DATA:  Neuro deficit, acute, stroke suspected acute onset vertigo; Neuro deficit, acute, stroke suspected EXAM: MRI HEAD WITHOUT CONTRAST MRA HEAD WITHOUT CONTRAST MRA NECK WITHOUT CONTRAST TECHNIQUE: Multiplanar, multiecho pulse sequences of the brain and surrounding structures were obtained without intravenous contrast. Angiographic images of the Circle of Willis were obtained using MRA technique without intravenous contrast. Angiographic images of the neck were obtained using MRA technique without intravenous contrast. Carotid stenosis measurements (when applicable) are obtained utilizing NASCET criteria, using the distal internal carotid diameter as the denominator. COMPARISON:  None Available. FINDINGS: MRI HEAD Motion artifact is present. Brain: There is no acute infarction or intracranial hemorrhage. There is no intracranial mass, mass effect, or edema. There is no hydrocephalus or extra-axial fluid collection. Prominence of the ventricles and sulci reflects minor parenchymal volume loss. Patchy T2 hyperintensity in the supratentorial white matter is nonspecific but may reflect minor chronic microvascular ischemic changes. Vascular: Major vessel flow voids at the skull base are preserved. Skull and upper cervical spine: Normal marrow signal is preserved. Sinuses/Orbits: Paranasal sinuses are aerated. Orbits are unremarkable. Other: Sella is unremarkable.  Mastoid air cells are clear. MRA HEAD Motion artifact is present.  Intracranial internal carotid arteries are patent. Middle and anterior cerebral arteries are patent. 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 Intracranial vertebral arteries, basilar artery, posterior cerebral arteries are patent. There is no significant stenosis or aneurysm. MRA NECK Motion artifact is present. Common, internal, and external carotid arteries are patent. Extracranial vertebral arteries are patent. Right vertebral is dominant. No hemodynamically significant stenosis. IMPRESSION: Motion degraded. No acute infarction, hemorrhage, or mass. Minor chronic microvascular ischemic changes. No large vessel occlusion or hemodynamically significant stenosis. Electronically Signed   By: Macy Mis M.D.   On: 07/03/2021 14:56   CT HEAD WO CONTRAST (5MM)  Result Date: 07/03/2021 CLINICAL DATA:  Neuro deficit, assess for stroke. EXAM: CT HEAD WITHOUT CONTRAST TECHNIQUE: Contiguous axial images were obtained from the base of the skull through the  vertex without intravenous contrast. RADIATION DOSE REDUCTION: This exam was performed according to the departmental dose-optimization program which includes automated exposure control, adjustment of the mA and/or kV according to patient size and/or use of iterative reconstruction technique. COMPARISON:  July 03, 2021, 11:22 a.m. FINDINGS: Brain: There is low-density in the left midbrain and low-density in the right cerebellum. There is no midline shift hydrocephalus or mass. No acute hemorrhage is identified. Vascular: No hyperdense vessel is noted. Skull: Normal. Negative for fracture or focal lesion. Sinuses/Orbits: No acute finding. Other: None. IMPRESSION: There is low-density in the left midbrain and low-density in the right cerebellum. This is suspicious for acute infarct. Further evaluation with MRI is recommended. Electronically Signed   By: Abelardo Diesel M.D.   On: 07/03/2021 13:22   CT HEAD CODE STROKE WO CONTRAST  Result Date:  07/03/2021 CLINICAL DATA:  Code stroke. EXAM: CT HEAD WITHOUT CONTRAST TECHNIQUE: Contiguous axial images were obtained from the base of the skull through the vertex without intravenous contrast. RADIATION DOSE REDUCTION: This exam was performed according to the departmental dose-optimization program which includes automated exposure control, adjustment of the mA and/or kV according to patient size and/or use of iterative reconstruction technique. COMPARISON:  Brain MRI 07/23/2019 FINDINGS: Brain: There is no acute intracranial hemorrhage, extra-axial fluid collection, or acute infarct. Parenchymal volume is normal. The ventricles are normal in size. Gray-white differentiation is preserved. There is no mass lesion.  There is no mass effect or midline shift. Vascular: No dense vessel is seen. There is calcification of the bilateral cavernous ICAs. Skull: Normal. Negative for fracture or focal lesion. Sinuses/Orbits: The paranasal sinuses are clear. Bilateral lens implants are in place. The globes and orbits are otherwise unremarkable. Other: None. ASPECTS [facility] Stroke Program Early CT Score) - Ganglionic level infarction (caudate, lentiform nuclei, internal capsule, insula, M1-M3 cortex): 7 - Supraganglionic infarction (M4-M6 cortex): 3 Total score (0-10 with 10 being normal): 10 IMPRESSION: 1. No acute intracranial pathology. 2. ASPECTS is 10 These results were called by telephone at the time of interpretation on 07/03/2021 at 11:30 am to provider Dr Quinn Axe, who verbally acknowledged these results. Electronically Signed   By: Valetta Mole M.D.   On: 07/03/2021 11:33    Procedures .Critical Care  Performed by: Regan Lemming, MD Authorized by: Regan Lemming, MD   Critical care provider statement:    Critical care time (minutes):  30   Critical care was necessary to treat or prevent imminent or life-threatening deterioration of the following conditions:  CNS failure or compromise   Critical care was time  spent personally by me on the following activities:  Development of treatment plan with patient or surrogate, discussions with consultants, evaluation of patient's response to treatment, examination of patient, ordering and review of laboratory studies, ordering and review of radiographic studies, ordering and performing treatments and interventions, pulse oximetry, re-evaluation of patient's condition and review of old charts     Medications Ordered in ED Medications   stroke: early stages of recovery book (has no administration in time range)  0.9 %  sodium chloride infusion (has no administration in time range)  acetaminophen (TYLENOL) tablet 650 mg (650 mg Oral Given 07/03/21 1340)    Or  acetaminophen (TYLENOL) 160 MG/5ML solution 650 mg ( Per Tube See Alternative 07/03/21 1340)    Or  acetaminophen (TYLENOL) suppository 650 mg ( Rectal See Alternative 07/03/21 1340)  senna-docusate (Senokot-S) tablet 1 tablet (has no administration in time range)  atorvastatin (LIPITOR) tablet  40 mg (40 mg Oral Given 07/03/21 1340)  escitalopram (LEXAPRO) tablet 20 mg (20 mg Oral Given 07/03/21 1340)  finasteride (PROSCAR) tablet 5 mg (has no administration in time range)  gabapentin (NEURONTIN) capsule 300 mg (has no administration in time range)  glipiZIDE (GLUCOTROL) tablet 5 mg (5 mg Oral Given 07/03/21 1340)  insulin glargine-yfgn (SEMGLEE) injection 40 Units (has no administration in time range)  nortriptyline (PAMELOR) capsule 25 mg (has no administration in time range)  pantoprazole (PROTONIX) EC tablet 40 mg (40 mg Oral Given 07/03/21 1340)  tamsulosin (FLOMAX) capsule 0.4 mg (has no administration in time range)  traZODone (DESYREL) tablet 50 mg (has no administration in time range)  clevidipine (CLEVIPREX) infusion 0.5 mg/mL (0 mg/hr Intravenous Stopped 07/03/21 1347)  Chlorhexidine Gluconate Cloth 2 % PADS 6 each (has no administration in time range)  Oral care mouth rinse (has no administration  in time range)  insulin aspart (novoLOG) injection 0-15 Units (has no administration in time range)  labetalol (NORMODYNE) 5 MG/ML injection (10 mg  Given 07/03/21 1125)  tenecteplase (TNKASE) injection for Stroke 24 mg (24 mg Intravenous Given 07/03/21 1134)    ED Course/ Medical Decision Making/ A&P                           Medical Decision Making Amount and/or Complexity of Data Reviewed Labs: ordered. Radiology: ordered.  Risk Decision regarding hospitalization.   61 year old male with medical history significant for DM 2, HTN, diabetic retinopathy, anxiety, depression, GERD, CAD, CHF, CKD, presenting to the emergency department with acute onset vertiginous symptoms.  The patient states that he was in his urology office with a complaint of sudden onset dizziness and slurred speech around 0 920.  The slurred speech is since resolved but the dizziness remains.  The patient endorses room spinning dizziness and an inability to ambulate or sit up due to the persistent room spinning.  He denies any facial droop, numbness or weakness.  Code stroke initiated on patient arrival.  On arrival, the patient was vitally stable, hypertensive BP 171/105, subsequently worsened to 212/126.  Patient presenting with acute onset vertiginous symptoms with associated mild dysmetria on exam and some dysarthria.  Code stroke was activated on patient arrival.  Following neurology evaluation initial CT head imaging (CTA and CT perfusion deferred due to the patient's renal function), the decision was made to administer TNK.  The patient was then admitted to the neuro ICU for further monitoring.  He was reevaluated by neurology with some worsening of his symptoms with a worsening headache status post TNK and a repeat CT head was performed which resulted negative for acute bleed.  The patient was then admitted to the ICU for observation status post TNK in stable condition.  Final Clinical Impression(s) / ED  Diagnoses Final diagnoses:  Dizziness    Rx / DC Orders ED Discharge Orders     None         Regan Lemming, MD 07/03/21 1648

## 2021-07-03 NOTE — ED Triage Notes (Signed)
Pt BIB [facility] from urology office C/O sudden onset dizziness and slurred speech at 0920. Slurred speech resolved, dizziness remains. EDP notified of patient's arrival, activated Code Stroke per Dr. Armandina Gemma.

## 2021-07-03 NOTE — Progress Notes (Signed)
PHARMACIST CODE STROKE RESPONSE  Notified to mix TNK at 1131 by Dr. Quinn Axe Delivered TNK to RN at 1133  TNK dose = 24 mg IV over 5 seconds  Issues/delays encountered (if applicable): slight delay due to patient returning from CT to ER room while in process of mixing medication   Arturo Morton, PharmD, BCPS Please check AMION for all [facility] contact numbers Clinical Pharmacist 07/03/2021 11:36 AM

## 2021-07-04 ENCOUNTER — Inpatient Hospital Stay (HOSPITAL_COMMUNITY): Payer: Medicaid Other

## 2021-07-04 DIAGNOSIS — I639 Cerebral infarction, unspecified: Secondary | ICD-10-CM | POA: Diagnosis not present

## 2021-07-04 LAB — HEMOGLOBIN A1C
Hgb A1c MFr Bld: 6.9 % — ABNORMAL HIGH (ref 4.8–5.6)
Mean Plasma Glucose: 151.33 mg/dL

## 2021-07-04 LAB — CBC
HCT: 28.6 % — ABNORMAL LOW (ref 39.0–52.0)
Hemoglobin: 9.7 g/dL — ABNORMAL LOW (ref 13.0–17.0)
MCH: 29.5 pg (ref 26.0–34.0)
MCHC: 33.9 g/dL (ref 30.0–36.0)
MCV: 86.9 fL (ref 80.0–100.0)
Platelets: 231 10*3/uL (ref 150–400)
RBC: 3.29 MIL/uL — ABNORMAL LOW (ref 4.22–5.81)
RDW: 14.7 % (ref 11.5–15.5)
WBC: 5.1 10*3/uL (ref 4.0–10.5)
nRBC: 0 % (ref 0.0–0.2)

## 2021-07-04 LAB — LIPID PANEL
Cholesterol: 104 mg/dL (ref 0–200)
HDL: 37 mg/dL — ABNORMAL LOW (ref >40)
LDL Cholesterol: 61 mg/dL (ref 0–99)
Total CHOL/HDL Ratio: 2.8 RATIO
Triglycerides: 28 mg/dL (ref <150)
VLDL: 6 mg/dL (ref 0–40)

## 2021-07-04 LAB — BASIC METABOLIC PANEL
Anion gap: 7 (ref 5–15)
BUN: 34 mg/dL — ABNORMAL HIGH (ref 8–23)
CO2: 21 mmol/L — ABNORMAL LOW (ref 22–32)
Calcium: 8.4 mg/dL — ABNORMAL LOW (ref 8.9–10.3)
Chloride: 109 mmol/L (ref 98–111)
Creatinine, Ser: 2.64 mg/dL — ABNORMAL HIGH (ref 0.61–1.24)
GFR, Estimated: 27 mL/min — ABNORMAL LOW (ref >60)
Glucose, Bld: 64 mg/dL — ABNORMAL LOW (ref 70–99)
Potassium: 4.1 mmol/L (ref 3.5–5.1)
Sodium: 137 mmol/L (ref 135–145)

## 2021-07-04 LAB — GLUCOSE, CAPILLARY
Glucose-Capillary: 81 mg/dL (ref 70–99)
Glucose-Capillary: 111 mg/dL — ABNORMAL HIGH (ref 70–99)

## 2021-07-04 MED ORDER — INSULIN GLARGINE-YFGN 100 UNIT/ML ~~LOC~~ SOLN
35.0000 [IU] | Freq: Every day | SUBCUTANEOUS | Status: DC
  Administered 2021-07-04: 35 [IU] via SUBCUTANEOUS
  Filled 2021-07-04 (×2): qty 0.35

## 2021-07-04 MED ORDER — ASPIRIN 81 MG PO CHEW
81.0000 mg | CHEWABLE_TABLET | Freq: Every day | ORAL | Status: DC
  Administered 2021-07-04 – 2021-07-05 (×2): 81 mg via ORAL
  Filled 2021-07-04 (×2): qty 1

## 2021-07-04 NOTE — Progress Notes (Signed)
Inpatient Diabetes Program Recommendations  AACE/ADA: New Consensus Statement on Inpatient Glycemic Control (2015)  Target Ranges:  Prepandial:   less than 140 mg/dL      Peak postprandial:   less than 180 mg/dL (1-2 hours)      Critically ill patients:  140 - 180 mg/dL   Lab Results  Component Value Date   GLUCAP 81 07/04/2021   HGBA1C 6.9 (H) 07/04/2021    Review of Glycemic Control  Latest Reference Range & Units 07/03/21 11:03 07/03/21 21:52 07/04/21 11:21  Glucose-Capillary 70 - 99 mg/dL 191 (H) 161 (H) 81   Diabetes history: DM 2 Outpatient Diabetes medications:  Glucotrol 5 mg daily Lantus 40 units q HS Current orders for Inpatient glycemic control:  Novolog 0-15 units tid with meals Glucotrol 5 mg daily  Semglee 40 units q HS Inpatient Diabetes Program Recommendations:    Consider reducing Semglee to 35 units q HS (low fasting CBG).   Thanks,  Adah Perl, RN, BC-ADM Inpatient Diabetes Coordinator Pager [phone]  (8a-5p)

## 2021-07-04 NOTE — Progress Notes (Signed)
PT Cancellation Note  Patient Details Name: Ryan Jenkins MRN: [id number] DOB: 04-18-1960   Cancelled Treatment:    Reason Eval/Treat Not Completed: Active bedrest order; will await upgraded activity orders prior to PT evaluation.   Reginia Naas 07/04/2021, 9:12 AM Magda Kiel, PT Acute Rehabilitation Services Office:[phone] 07/04/2021

## 2021-07-04 NOTE — Evaluation (Signed)
Occupational Therapy Evaluation Patient Details Name: Ryan Jenkins MRN: [id number] DOB: 1960-09-23 Today's Date: 07/04/2021   History of Present Illness This is a 61 year old gentleman who presents with acute onset vertigo and nausea with vomiting with dizziness and mild dysarthria.  Admitted as code stroke from urology office where symptoms began; he was given TNK.  Recent hospitalization due to CHF and pneumonia. 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   Clinical Impression   Ryan Jenkins was evaluated s/p the above admission list, he is generally mod I at baseline with mobility and ADLs and had an PCA who assists with IADLs as needed. He lives with his brother who drives him and also assists as needed. Upon evaluation pt was supervision A for all functional mobility with a RW. Overall he required set up for UB ADLs and supervision - min G fro LB tasks. He demonstrated good insight to his deficits and safety but is limited by general weakness and decreased activity tolerance. OT to continue to follow acutely. Recommend d/c to home with [place], unless pt progresses to a mod I level acutely.    Recommendations for follow up therapy are one component of a multi-disciplinary discharge planning process, led by the attending physician.  Recommendations may be updated based on patient status, additional functional criteria and insurance authorization.   Follow Up Recommendations  Home health OT    Assistance Recommended at Discharge Intermittent Supervision/Assistance  Patient can return home with the following A little help with walking and/or transfers;A little help with bathing/dressing/bathroom    Functional Status Assessment  Patient has had a recent decline in their functional  status and demonstrates the ability to make significant improvements in function in a reasonable and predictable amount of time.  Equipment Recommendations  None recommended by OT       Precautions / Restrictions Precautions Precautions: Fall Precaution Comments: watch O2 Restrictions Weight Bearing Restrictions: No      Mobility Bed Mobility Overal bed mobility: Needs Assistance Bed Mobility: Sit to Supine, Supine to Sit     Supine to sit: Supervision Sit to supine: Supervision   General bed mobility comments: increased time and effort, more so to get back into bed    Transfers Overall transfer level: Needs assistance Equipment used: Rolling walker (2 wheels) Transfers: Sit to/from Stand Sit to Stand: Supervision                  Balance Overall balance assessment: Needs assistance Sitting-balance support: Feet supported Sitting balance-Leahy Scale: Good     Standing balance support: Single extremity supported, During functional activity Standing balance-Leahy Scale: Fair Standing balance comment: stood at the sink for grooming tasks                           ADL either performed or assessed with clinical judgement   ADL Overall ADL's : Needs assistance/impaired Eating/Feeding: Independent   Grooming: Supervision/safety;Standing Grooming Details (indicate cue type and reason): at the sink this session with RW Upper Body Bathing: Set up;Sitting   Lower Body Bathing: Min guard;Sit to/from stand   Upper Body Dressing : Set up;Sitting   Lower Body Dressing: Min guard;Sit to/from stand   Toilet Transfer: Supervision/safety;Ambulation;Rolling walker (2 wheels)   Toileting- Clothing Manipulation and Hygiene: Supervision/safety;Sitting/lateral lean  Functional mobility during ADLs: Supervision/safety;Rolling walker (2 wheels) General ADL Comments: assist for safety only. pt benefits from increased time for processing and problem solving.  He demonstrates good insight to deficits and safety.     Vision Baseline Vision/History: 1 Wears glasses Ability to See in Adequate Light: 0 Adequate Patient Visual Report: No change from baseline Vision Assessment?: No apparent visual deficits Additional Comments: report of blurry vision during dizzy episode - has since resolved            Pertinent Vitals/Pain Pain Assessment Pain Assessment: No/denies pain Pain Intervention(s): Monitored during session     Hand Dominance Right   Extremity/Trunk Assessment Upper Extremity Assessment Upper Extremity Assessment: RUE deficits/detail;LUE deficits/detail RUE Deficits / Details: grossly WFL except hand weakness from neuropathy RUE Sensation: history of peripheral neuropathy;decreased light touch LUE Deficits / Details: grossly WFL except hand weakness from neuropathy LUE Sensation: decreased light touch;history of peripheral neuropathy   Lower Extremity Assessment Lower Extremity Assessment: Defer to PT evaluation RLE Deficits / Details: AROM WFL, strength hip flexion 4/5, knee extension 4+/5, ankle DF 4/5 RLE Sensation: history of peripheral neuropathy;decreased light touch LLE Deficits / Details: AROM WFL, strength hip flexion 3/5, knee extension 4/5, ankle DF 4-/5 LLE Sensation: history of peripheral neuropathy;decreased light touch   Cervical / Trunk Assessment Cervical / Trunk Assessment: Kyphotic   Communication Communication Communication: No difficulties   Cognition Arousal/Alertness: Awake/alert Behavior During Therapy: WFL for tasks assessed/performed Overall Cognitive Status: Impaired/Different from baseline Area of Impairment: Attention, Problem solving, Following commands     Current Attention Level: Selective   Following Commands: Follows one step commands consistently, Follows multi-step commands with increased time     Problem Solving: Slow processing General Comments: flat affect but overall WFL with  increased time     General Comments  VSS on RA     Home Living Family/patient expects to be discharged to:: Private residence Living Arrangements: Other relatives Available Help at Discharge: Family;Personal care attendant;Available PRN/intermittently Type of Home: Apartment Home Access: Level entry     Home Layout: One level     Bathroom Shower/Tub: Teacher, early years/pre: Standard     Home Equipment: Conservation officer, nature (2 wheels);Rollator (4 wheels);BSC/3in1;Tub bench;Wheelchair - manual   Additional Comments: Mod indep with household mobility      Prior Functioning/Environment Prior Level of Function : Independent/Modified Independent             Mobility Comments: walks with rollator at baseline, does not drive ADLs Comments: independent with ADLs, intermittent assistance with IADLs (sometimes waits for aide to shower if feeling weak, she helps with medications for caps)        OT Problem List: Decreased activity tolerance;Impaired balance (sitting and/or standing)      OT Treatment/Interventions: Self-care/ADL training;Therapeutic exercise;DME and/or AE instruction;Therapeutic activities;Patient/family education;Balance training    OT Goals(Current goals can be found in the care plan section) Acute Rehab OT Goals Patient Stated Goal: to go home OT Goal Formulation: With patient Time For Goal Achievement: 07/18/21 Potential to Achieve Goals: Good ADL Goals Pt/caregiver will Perform Home Exercise Program: Increased strength;Both right and left upper extremity;With written HEP provided Additional ADL Goal #1: Pt will complete all BADLs with mod I Additional ADL Goal #2: Pt will demonstrate increased activity tolerance to complete at least 3 funcitonal tasks in standing with supervision A  OT Frequency: Min 2X/week       AM-PAC OT "6 Clicks" Daily Activity  Outcome Measure Help from another person eating meals?: None Help from another person  taking care of personal grooming?: A Little Help from another person toileting, which includes using toliet, bedpan, or urinal?: A Little Help from another person bathing (including washing, rinsing, drying)?: A Little Help from another person to put on and taking off regular upper body clothing?: None Help from another person to put on and taking off regular lower body clothing?: A Little 6 Click Score: 20   End of Session Equipment Utilized During Treatment: Gait belt;Rolling walker (2 wheels) Nurse Communication: Mobility status  Activity Tolerance: Patient tolerated treatment well Patient left: in bed;with call bell/phone within reach  OT Visit Diagnosis: Unsteadiness on feet (R26.81);Other abnormalities of gait and mobility (R26.89);Muscle weakness (generalized) (M62.81);Pain                Time: 1335-1355 OT Time Calculation (min): 20 min Charges:  OT General Charges $OT Visit: 1 Visit OT Evaluation $OT Eval Moderate Complexity: 1 Mod   Casey Maxfield A Leyton Brownlee 07/04/2021, 2:05 PM

## 2021-07-04 NOTE — Evaluation (Signed)
Speech Language Pathology Evaluation Patient Details Name: Ryan Jenkins MRN: [id number] DOB: January 30, 1960 Today's Date: 07/04/2021 Time: 0737-1062 SLP Time Calculation (min) (ACUTE ONLY): 21 min  Problem List:  Patient Active Problem List   Diagnosis Date Noted   Acute ischemic stroke ([place]) 07/03/2021   Severe sepsis ([place]) 06/05/2021   Pneumonia 06/04/2021   Coronary artery disease 02/07/2021   Acute renal failure superimposed on stage 4 chronic kidney disease (HCC)    Acute respiratory failure with hypoxia ([place]) 02/01/2021   Acute on chronic congestive heart failure ([place]) 01/31/2021   Acute CHF (congestive heart failure) ([place]) 08/03/2020   CKD (chronic kidney disease) stage 4, GFR 15-29 ml/min ([place]) 07/07/2020   Chest pain 07/07/2020   Acute on chronic diastolic CHF (congestive heart failure) ([place]) 07/07/2020   Erectile dysfunction associated with type 2 diabetes mellitus ([place]) 08/07/2019   Hives 08/07/2019   Vitreous hemorrhage of left eye ([place]) 07/22/2019   Elevated troponin 07/22/2019   Vision loss of left eye 07/22/2019   History of medication noncompliance 04/02/2019   Gastric ulcer without hemorrhage or perforation 12/25/2018   CKD (chronic kidney disease), stage III ([place]) 12/25/2018   Gastroesophageal reflux disease without esophagitis 09/04/2018   Diabetic retinopathy of both eyes associated with type 2 diabetes mellitus ([place]) 01/03/2018   Intermittent diarrhea 09/27/2017   Gastroparesis 09/27/2017   Macroalbuminuric diabetic nephropathy ([place]) 06/25/2017   History of falling 06/25/2017   Hyperlipidemia 03/21/2017   Vitamin B 12 deficiency 03/21/2017   Type 2 diabetes mellitus with peripheral neuropathy ([place]) 02/05/2017   Essential hypertension 02/05/2017   Depression 02/05/2017   Unintended weight loss 02/05/2017   Gait disturbance 02/05/2017   Pronation deformity of both feet 05/11/2014   Diabetic neuropathy, type II diabetes mellitus ([place]) 05/11/2014    Metatarsal deformity 05/11/2014   Past Medical History:  Past Medical History:  Diagnosis Date   Anemia    Anxiety    BPH (benign prostatic hyperplasia)    CAD (coronary artery disease)    Chronic diastolic CHF (congestive heart failure) (HCC)    Chronic kidney disease, stage IV (severe) ([place])    Depression    Diabetes mellitus with complication (HCC)    Diabetic neuropathy ([place])    GERD (gastroesophageal reflux disease)    Hypertension    Retinopathy due to secondary diabetes ([place])    Sleep disturbances 11/25/2018   Vision changes 11/25/2018   Vitamin B12 deficiency    Vitreous hemorrhage ([place])    Past Surgical History:  Past Surgical History:  Procedure Laterality Date   CATARACT EXTRACTION Bilateral 2017   HPI:  This is a 61 year old gentleman who presented with acute onset vertigo and nausea with vomiting.  MRI 6/19 with no acute findings.  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   Assessment / Plan / Recommendation Clinical Impression  Pt presents with mild cognitive deficits.  Pt was assessed using the COGNISTAT (see below for additional information).  Pt performed within the average range on all subtests except for word recall.  Pt benefited from category cues on 2 of 3 missed targets.  Pt endorses changes to memory and is agreeable to therapy.    Pt's speech was clear and without dysarthria.  He reports that he was told he was slurring speech at urology visit immediately prior to  hospitalization, but did not appreciate this himself.  Pt's speech was 100% intelligible during this assessment and he is a very pleasant conversation partner.    Recommend speech therapy in house to address cognitive deficits, and would likely benefit from ST at next  level of care.   COGNISTAT: All subtests are within the average range, except where otherwise specified.  Orientation:  12/12 Attention: 8/8 Comprehension: 5/6 Repetition: 12/12 Naming: 7/8 Construction: 6/6 Memory: 7/12, Mild-Moderate Impairment Calculations: 3/4 Similarities: 7/8 Judgment: 6/6     SLP Assessment  SLP Recommendation/Assessment: Patient needs continued Speech Lanaguage Pathology Services SLP Visit Diagnosis: Cognitive communication deficit (R41.841)    Recommendations for follow up therapy are one component of a multi-disciplinary discharge planning process, led by the attending physician.  Recommendations may be updated based on patient status, additional functional criteria and insurance authorization.    Follow Up Recommendations   (Continue ST at next level of care)    Assistance Recommended at Discharge  None  Functional Status Assessment Patient has had a recent decline in their functional status and demonstrates the ability to make significant improvements in function in a reasonable and predictable amount of time.  Frequency and Duration min 2x/week  2 weeks      SLP Evaluation Cognition  Overall Cognitive Status: Impaired/Different from baseline Orientation Level: Oriented X4 Attention: Focused;Sustained Focused Attention: Appears intact Sustained Attention: Appears intact Memory: Impaired Memory Impairment: Decreased short term memory Awareness: Appears intact Problem Solving: Appears intact Executive Function: Reasoning Reasoning: Appears intact       Comprehension  Auditory Comprehension Overall Auditory Comprehension: Appears within functional limits for tasks assessed Commands: Within Functional Limits Conversation: Complex Visual Recognition/Discrimination Discrimination: Not tested Reading Comprehension Reading Status: Not tested    Expression Expression Primary Mode of Expression: Verbal Verbal Expression Overall Verbal  Expression: Appears within functional limits for tasks assessed Repetition: No impairment Naming: No impairment Pragmatics: No impairment Written Expression Dominant Hand: Right Written Expression: Not tested   Oral / Motor  Motor Speech Overall Motor Speech: Appears within functional limits for tasks assessed Respiration: Within functional limits Resonance: Within functional limits Articulation: Within functional limitis Intelligibility: Intelligible Motor Planning: Witnin functional limits Motor Speech Errors: Not applicable            Celedonio Savage, [place], [place] Office: [phone] 07/04/2021, 12:18 PM

## 2021-07-04 NOTE — Evaluation (Signed)
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  Admitted as code stroke from urology office where symptoms began; he was given TNK.  Recent hospitalization due to CHF and pneumonia.   Clinical Impression  Patient presents with mobility close to functional baseline.  Needing a little help today with sit to stand and during positional testing for sidelying to sit due to bedrest after TNK and slow to rise with recent pneumonia/CHF hospitalization.  He normally mobilizes with walker unaided in the home and has an aide 3 days a week to help with IADL's and on days he feels weaker with ADL's.  Feel he will continue to progress with mobility with skilled PT in the acute setting.  Vestibular testing today without provocation of vertigo symptoms.  History sounds like may have had BPPV with lasting nausea and vomiting, however, has some slow beating nystagmus with gaze holding bilaterally which can indicate central sign, but pt without complaints of spinning and has history of vitreous hemorrhage on the R so possibly not a vestibular issue.  PT will follow up and pt educated on follow up with PCP and given outpatient PT referral in case symptoms recur, (but warned to call [phone] if symptoms different or present with other stroke symptoms.)         Recommendations for follow up therapy are one component of a multi-disciplinary discharge planning process, led by the attending physician.  Recommendations may be updated based  on patient status, additional functional criteria and insurance authorization.  Follow Up Recommendations No PT follow up    Assistance Recommended at Discharge Intermittent Supervision/Assistance  Patient can return home with the following  A little help with walking and/or transfers;Assistance with cooking/housework;Assist for transportation;Help with stairs or ramp for entrance;A little help with bathing/dressing/bathroom    Equipment Recommendations None recommended by PT  Recommendations for Other Services       Functional Status Assessment Patient has had a recent decline in their functional status and demonstrates the ability to make significant improvements in function in a reasonable and predictable amount of time.     Precautions / Restrictions Precautions Precautions: Fall      Mobility  Bed Mobility Overal bed mobility: Needs Assistance Bed Mobility: Supine to Sit     Supine to sit: HOB elevated, Supervision     General bed mobility comments: increased time and assist for lines    Transfers Overall transfer level: Needs assistance Equipment used: Rolling walker (2 wheels) Transfers: Sit to/from Stand Sit to Stand: Min assist           General transfer comment: assist for balance and increased time to rise with heavy UE assist needed    Ambulation/Gait Ambulation/Gait assistance: Min guard Gait Distance (Feet): 150 Feet Assistive device: Rolling walker (2 wheels) Gait Pattern/deviations: Step-through pattern, Decreased stride length, Trunk flexed, Shuffle       General Gait Details: mild trunk flexion, some shuffling with decreased ankle DF bilaterally due to neuropathy  Financial trader  Rankin (Stroke Patients Only) Modified Rankin (Stroke Patients Only) Pre-Morbid Rankin Score: No significant disability Modified Rankin: Moderate disability     Balance Overall balance assessment: Needs assistance    Sitting balance-Leahy Scale: Good     Standing balance support: Single extremity supported, Reliant on assistive device for balance Standing balance-Leahy Scale: Poor Standing balance comment: UE support for balance with h/o neuropathy                             Pertinent Vitals/Pain Pain Assessment Pain Assessment: No/denies pain    Home Living Family/patient expects to be discharged to:: Private residence Living Arrangements: Other relatives (brother) Available Help at Discharge: Family;Personal care attendant;Available PRN/intermittently (aide MWF 10-2) Type of Home: Apartment Home Access: Level entry       Home Layout: One level Home Equipment: Conservation officer, nature (2 wheels);Rollator (4 wheels);BSC/3in1;Tub bench;Wheelchair - manual Additional Comments: Mod indep with household mobility    Prior Function Prior Level of Function : Independent/Modified Independent             Mobility Comments: walks with RW at baseline, does not drive ADLs Comments: independent with ADLs, intermittent assistance with IADLs (sometimes waits for aide to shower if feeling weak, she helps with medications for caps)     Hand Dominance   Dominant Hand: Right    Extremity/Trunk Assessment   Upper Extremity Assessment Upper Extremity Assessment: RUE deficits/detail;LUE deficits/detail RUE Deficits / Details: grossly WFL except hand weakness from neuropathy RUE Sensation: history of peripheral neuropathy;decreased light touch LUE Deficits / Details: grossly WFL except hand weakness from neuropathy LUE Sensation: decreased light touch;history of peripheral neuropathy    Lower Extremity Assessment Lower Extremity Assessment: LLE deficits/detail;RLE deficits/detail RLE Deficits / Details: AROM WFL, strength hip flexion 4/5, knee extension 4+/5, ankle DF 4/5 RLE Sensation: history of peripheral neuropathy;decreased light touch LLE Deficits / Details: AROM WFL, strength hip  flexion 3/5, knee extension 4/5, ankle DF 4-/5 LLE Sensation: history of peripheral neuropathy;decreased light touch    Cervical / Trunk Assessment Cervical / Trunk Assessment: Kyphotic  Communication   Communication: No difficulties  Cognition Arousal/Alertness: Awake/alert Behavior During Therapy: WFL for tasks assessed/performed Overall Cognitive Status: Within Functional Limits for tasks assessed                                          General Comments General comments (skin integrity, edema, etc.): VSS with mobility.   Vestibular Assessment - 07/04/21 0001       Symptom Behavior   Subjective history of current problem Reports symptoms of spinning when laid back on exam table at urologist and symptoms persisted with N&V throughout.  Denies symptoms this morning.    Type of Dizziness  Spinning;Imbalance    Frequency of Dizziness intermittent    Duration of Dizziness lasted for couple hours yesterday    Symptom Nature Positional;Intermittent    Aggravating Factors Lying supine;Looking up to the ceiling    Relieving Factors Rest    Progression of Symptoms Better    History of similar episodes No      Oculomotor Exam   Oculomotor Alignment Normal   but blind in R eye h/o vitreous hemorrhage   Ocular ROM WFL    Spontaneous Absent    Gaze-induced  Direction changing nystagmus;Left beating nystagmus with L gaze;Right beating nystagmus with R  gaze   slow beating with moving eyes either end range, but denies symptoms   Head shaking Horizontal Absent    Smooth Pursuits Intact    Saccades Slow      Oculomotor Exam-Fixation Suppressed    Left Head Impulse negative for refixation    Right Head Impulse negative for refixation      Vestibulo-Ocular Reflex   VOR 1 Head Only (x 1 viewing) performed for horizontal and vertical head movements with target maintenance and denies symptoms      Auditory   Comments intact to scratch test and equal bilateral       Positional Testing   Sidelying Test Sidelying Right;Sidelying Left      Sidelying Right   Sidelying Right Duration 45 sec    Sidelying Right Symptoms Left nystagmus   but denies symptoms; slow as during L gaze holding     Sidelying Left   Sidelying Left Duration 45 sec    Sidelying Left Symptoms Right nystagmus   slow like during R gaze holding and pt denies symptoms               Exercises     Assessment/Plan    PT Assessment Patient needs continued PT services  PT Problem List Decreased mobility;Other (comment);Impaired sensation (dizziness)       PT Treatment Interventions Patient/family education;Gait training;Functional mobility training;Therapeutic activities;Neuromuscular re-education    PT Goals (Current goals can be found in the Care Plan section)  Acute Rehab PT Goals Patient Stated Goal: return to independent PT Goal Formulation: With patient Time For Goal Achievement: 07/17/21 Potential to Achieve Goals: Good    Frequency Min 4X/week     Co-evaluation               AM-PAC PT "6 Clicks" Mobility  Outcome Measure Help needed turning from your back to your side while in a flat bed without using bedrails?: None Help needed moving from lying on your back to sitting on the side of a flat bed without using bedrails?: A Little Help needed moving to and from a bed to a chair (including a wheelchair)?: A Little Help needed standing up from a chair using your arms (e.g., wheelchair or bedside chair)?: A Little Help needed to walk in hospital room?: A Little Help needed climbing 3-5 steps with a railing? : Total 6 Click Score: 17    End of Session Equipment Utilized During Treatment: Gait belt Activity Tolerance: Patient tolerated treatment well Patient left: in chair;with call bell/phone within reach;with chair alarm set   PT Visit Diagnosis: Other symptoms and signs involving the nervous system (R29.898);Dizziness and giddiness (R42);Other  abnormalities of gait and mobility (R26.89)    Time: 7948-0165 PT Time Calculation (min) (ACUTE ONLY): 41 min   Charges:   PT Evaluation $PT Eval Low Complexity: 1 Low PT Treatments $Gait Training: 8-22 mins $Neuromuscular Re-education: 8-22 mins        Magda Kiel, PT Acute Rehabilitation Services [id number]:[phone] [phone] 07/04/2021   Reginia Naas 07/04/2021, 11:31 AM

## 2021-07-04 NOTE — Progress Notes (Addendum)
STROKE TEAM PROGRESS NOTE   INTERVAL HISTORY He is resting in bed and no family is at the bedside.  No overnight complaints. Alert and oriented x 3.  He states he was at the urologist office and when they asked him to lay flat and lower his head down to walk on his urinary catheter he developed sudden onset of vertigo and nausea and room spinning.  Denies any other focal neurological accompanying symptoms.  MRI scan of the brain is negative for acute infarct.  MRI of the brain shows no large vessel stenosis or occlusion.  He had an echocardiogram on 06/05/2021 which showed normal ejection fraction of 60 to 65%.  His urinalysis suggest infection but urine culture has not yet been sent chronic indwelling urinary catheter Vitals:   07/04/21 1130 07/04/21 1139 07/04/21 1152 07/04/21 1405  BP: (!) 166/91  (!) 170/94 (!) 155/87  Pulse: 77  82 78  Resp: '16  13 20  '$ Temp:  98.6 F (37 C)    TempSrc:  Oral    SpO2: 100%  99% 98%  Weight:      Height:       CBC:  Recent Labs  Lab 07/03/21 1110 07/03/21 1126 07/04/21 0601  WBC 6.0  --  5.1  NEUTROABS 4.2  --   --   HGB 11.1* 11.2* 9.7*  HCT 32.9* 33.0* 28.6*  MCV 89.4  --  86.9  PLT 245  --  119   Basic Metabolic Panel:  Recent Labs  Lab 07/03/21 1110 07/03/21 1126 07/04/21 0601  NA 137 141 137  K 4.4 4.5 4.1  CL 109 107 109  CO2 20*  --  21*  GLUCOSE 184* 181* 64*  BUN 31* 44* 34*  CREATININE 2.52* 2.80* 2.64*  CALCIUM 8.8*  --  8.4*   Lipid Panel:  Recent Labs  Lab 07/04/21 0601  CHOL 104  TRIG 28  HDL 37*  CHOLHDL 2.8  VLDL 6  LDLCALC 61   HgbA1c:  Recent Labs  Lab 07/04/21 0601  HGBA1C 6.9*   Urine Drug Screen:  Recent Labs  Lab 07/03/21 1701  LABOPIA NONE DETECTED  COCAINSCRNUR NONE DETECTED  LABBENZ NONE DETECTED  AMPHETMU NONE DETECTED  THCU NONE DETECTED  LABBARB NONE DETECTED    Alcohol Level  Recent Labs  Lab 07/03/21 1329  ETH <10    IMAGING past 24 hours CT HEAD WO CONTRAST  (5MM)  Result Date: 07/04/2021 CLINICAL DATA:  Stroke suspected following TNK administration EXAM: CT HEAD WITHOUT CONTRAST TECHNIQUE: Contiguous axial images were obtained from the base of the skull through the vertex without intravenous contrast. RADIATION DOSE REDUCTION: This exam was performed according to the departmental dose-optimization program which includes automated exposure control, adjustment of the mA and/or kV according to patient size and/or use of iterative reconstruction technique. COMPARISON:  CT and brain MRI dated 1 day prior FINDINGS: Brain: There is no acute intracranial hemorrhage, extra-axial fluid collection, or acute infarct. Parenchymal volume is normal. The ventricles are normal in size. Gray-white differentiation is preserved. There is no mass lesion.  There is no mass effect or midline shift. Vascular: No hyperdense vessel or unexpected calcification. Skull: Normal. Negative for fracture or focal lesion. Sinuses/Orbits: The paranasal sinuses are clear. Bilateral lens implants are in place. The globes and orbits are otherwise unremarkable. Other: None. IMPRESSION: No acute intracranial hemorrhage or infarct. Electronically Signed   By: Valetta Mole M.D.   On: 07/04/2021 12:18   MR ANGIO HEAD  WO CONTRAST  Result Date: 07/03/2021 CLINICAL DATA:  Neuro deficit, acute, stroke suspected acute onset vertigo; Neuro deficit, acute, stroke suspected EXAM: MRI HEAD WITHOUT CONTRAST MRA HEAD WITHOUT CONTRAST MRA NECK WITHOUT CONTRAST TECHNIQUE: Multiplanar, multiecho pulse sequences of the brain and surrounding structures were obtained without intravenous contrast. Angiographic images of the Circle of Willis were obtained using MRA technique without intravenous contrast. Angiographic images of the neck were obtained using MRA technique without intravenous contrast. Carotid stenosis measurements (when applicable) are obtained utilizing NASCET criteria, using the distal internal carotid  diameter as the denominator. COMPARISON:  None Available. FINDINGS: MRI HEAD Motion artifact is present. Brain: There is no acute infarction or intracranial hemorrhage. There is no intracranial mass, mass effect, or edema. There is no hydrocephalus or extra-axial fluid collection. Prominence of the ventricles and sulci reflects minor parenchymal volume loss. Patchy T2 hyperintensity in the supratentorial white matter is nonspecific but may reflect minor chronic microvascular ischemic changes. Vascular: Major vessel flow voids at the skull base are preserved. Skull and upper cervical spine: Normal marrow signal is preserved. Sinuses/Orbits: Paranasal sinuses are aerated. Orbits are unremarkable. Other: Sella is unremarkable.  Mastoid air cells are clear. MRA HEAD Motion artifact is present. Intracranial internal carotid arteries are patent. Middle and anterior cerebral arteries are patent. 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 Intracranial vertebral arteries, basilar artery, posterior cerebral arteries are patent. There is no significant stenosis or aneurysm. MRA NECK Motion artifact is present. Common, internal, and external carotid arteries are patent. Extracranial vertebral arteries are patent. Right vertebral is dominant. No hemodynamically significant stenosis. IMPRESSION: Motion degraded. No acute infarction, hemorrhage, or mass. Minor chronic microvascular ischemic changes. No large vessel occlusion or hemodynamically significant stenosis. Electronically Signed   By: Macy Mis M.D.   On: 07/03/2021 14:56   MR ANGIO NECK WO CONTRAST  Result Date: 07/03/2021 CLINICAL DATA:  Neuro deficit, acute, stroke suspected acute onset vertigo; Neuro deficit, acute, stroke suspected EXAM: MRI HEAD WITHOUT CONTRAST MRA HEAD WITHOUT CONTRAST MRA NECK WITHOUT CONTRAST TECHNIQUE: Multiplanar, multiecho pulse sequences of the brain and surrounding structures were obtained without intravenous contrast.  Angiographic images of the Circle of Willis were obtained using MRA technique without intravenous contrast. Angiographic images of the neck were obtained using MRA technique without intravenous contrast. Carotid stenosis measurements (when applicable) are obtained utilizing NASCET criteria, using the distal internal carotid diameter as the denominator. COMPARISON:  None Available. FINDINGS: MRI HEAD Motion artifact is present. Brain: There is no acute infarction or intracranial hemorrhage. There is no intracranial mass, mass effect, or edema. There is no hydrocephalus or extra-axial fluid collection. Prominence of the ventricles and sulci reflects minor parenchymal volume loss. Patchy T2 hyperintensity in the supratentorial white matter is nonspecific but may reflect minor chronic microvascular ischemic changes. Vascular: Major vessel flow voids at the skull base are preserved. Skull and upper cervical spine: Normal marrow signal is preserved. Sinuses/Orbits: Paranasal sinuses are aerated. Orbits are unremarkable. Other: Sella is unremarkable.  Mastoid air cells are clear. MRA HEAD Motion artifact is present. Intracranial internal carotid arteries are patent. Middle and anterior cerebral arteries are patent. 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 Intracranial vertebral arteries, basilar artery, posterior cerebral arteries are patent. There is no significant stenosis or aneurysm. MRA NECK Motion artifact is present. Common, internal, and external carotid arteries are patent. Extracranial vertebral arteries are patent. Right vertebral is dominant. No hemodynamically significant stenosis. IMPRESSION: Motion degraded. No acute infarction, hemorrhage, or mass.  Minor chronic microvascular ischemic changes. No large vessel occlusion or hemodynamically significant stenosis. Electronically Signed   By: Macy Mis M.D.   On: 07/03/2021 14:56   MR BRAIN WO CONTRAST  Result Date: 07/03/2021 CLINICAL DATA:   Neuro deficit, acute, stroke suspected acute onset vertigo; Neuro deficit, acute, stroke suspected EXAM: MRI HEAD WITHOUT CONTRAST MRA HEAD WITHOUT CONTRAST MRA NECK WITHOUT CONTRAST TECHNIQUE: Multiplanar, multiecho pulse sequences of the brain and surrounding structures were obtained without intravenous contrast. Angiographic images of the Circle of Willis were obtained using MRA technique without intravenous contrast. Angiographic images of the neck were obtained using MRA technique without intravenous contrast. Carotid stenosis measurements (when applicable) are obtained utilizing NASCET criteria, using the distal internal carotid diameter as the denominator. COMPARISON:  None Available. FINDINGS: MRI HEAD Motion artifact is present. Brain: There is no acute infarction or intracranial hemorrhage. There is no intracranial mass, mass effect, or edema. There is no hydrocephalus or extra-axial fluid collection. Prominence of the ventricles and sulci reflects minor parenchymal volume loss. Patchy T2 hyperintensity in the supratentorial white matter is nonspecific but may reflect minor chronic microvascular ischemic changes. Vascular: Major vessel flow voids at the skull base are preserved. Skull and upper cervical spine: Normal marrow signal is preserved. Sinuses/Orbits: Paranasal sinuses are aerated. Orbits are unremarkable. Other: Sella is unremarkable.  Mastoid air cells are clear. MRA HEAD Motion artifact is present. Intracranial internal carotid arteries are patent. Middle and anterior cerebral arteries are patent. 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 Intracranial vertebral arteries, basilar artery, posterior cerebral arteries are patent. There is no significant stenosis or aneurysm. MRA NECK Motion artifact is present. Common, internal, and external carotid arteries are patent. Extracranial vertebral arteries are patent. Right vertebral is dominant. No hemodynamically significant stenosis.  IMPRESSION: Motion degraded. No acute infarction, hemorrhage, or mass. Minor chronic microvascular ischemic changes. No large vessel occlusion or hemodynamically significant stenosis. Electronically Signed   By: Macy Mis M.D.   On: 07/03/2021 14:56    PHYSICAL EXAM Physical Exam Gen: A&O x4, NAD HEENT: Atraumatic, normocephalic;mucous membranes moist; oropharynx clear, tongue without atrophy or fasciculations. Neck: Supple, trachea midline. Resp: CTAB, no w/r/r CV: RRR, no m/g/r; nml S1 and S2. 2+ symmetric peripheral pulses. Abd: soft/NT/ND; nabs x 4 quad Extrem: Nml bulk; no cyanosis, clubbing, or edema.   Neuro: MS: A&O x4. Follows multi-step commands.  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  Smile symmetric. VIII: Hearing intact to voice IX,X: Voice normal, palate elevates symmetrically  XI: SCM/trap 5/5 bilat   XII: Tongue protrudes midline, no atrophy or fasciculations  Motor:   Normal bulk.  No tremor, rigidity or bradykinesia. Full strength BUE and RLE; 4+/5 LLE but able to hold anti-gravity without drift Sensory: Intact to light touch, pinprick, temperature vibration throughout. Symmetric. Propioception intact bilat.  No double-simultaneous extinction.  Coordination:  Dysmetria on FNF bilat without frank ataxia, HTS intact bilat. Reflexes:  toes down-going bilat Gait: deferred     Interval: Shift assessment (06/20 0700) Level of Consciousness (1a.)   : Alert, keenly responsive (06/20 1152) LOC Questions (1b. )   +: Answers both questions correctly (06/20 1152) LOC Commands (1c. )   + : Performs both  tasks correctly (06/20 1152) Best Gaze (2. )  +: Normal (06/20 1152) Visual (3. )  +: No  visual loss (06/20 1152) Facial Palsy (4. )    : Normal symmetrical movements (06/20 1152) Motor Arm, Left (5a. )   +: No drift (06/20 1152) Motor Arm, Right (5b. )   +: No drift (06/20 1152) Motor Leg, Left (6a. )   +: No drift (06/20 1152) Motor Leg, Right (6b. )   +: No drift (06/20 1152) Limb Ataxia (7. ): Absent (06/20 1152) Sensory (8. )   +: Normal, no sensory loss (06/20 1152) Best Language (9. )   +: No aphasia (06/20 1152) Dysarthria (10. ): Normal (06/20 1152) Extinction/Inattention (11.)   +: No Abnormality (06/20 1152) Modified SS Total  +: 0 (06/20 1152) Complete NIHSS TOTAL: 0 (06/20 1152)    Premorbid mRS = 0   Updated 07/04/21  ASSESSMENT/PLAN Mr. Ryan Jenkins is a 61 y.o. 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  Last known well 920 this morning.  Since that time room has been spinning/he feels like he is moving when he is not.  He has vomited multiple times.  NIH stroke scale was 1 for mild dysarthria however he did have significant and persistent vertigo.  CT head showed no acute intracranial process.  Risks and benefits of TNK were discussed and patient elected to proceed.  There was a delay in TNK due to blood pressure control.  He was ultimately controlled with IV labetalol and he was administered thrombolytic.  Patient has severe CKD with a creatinine of 3.8 last month in clinic therefore CTA CTP was not performed as part of the stroke code.  His exam was not consistent with large vessel occlusion.  Approximately 90 minutes after TNK was administered patient complained of a slight worsening of the headache that developed this morning (initially 3 out of 5, now 5 out of 5 and radiating down his neck).   His neurologic exam was unchanged.  Repeat CT head showed no evidence of bleeding.  CNS imaging was personally reviewed.   His symptoms began when he was in the urology clinic.  He has BPH and has had an indwelling Foley for 2 weeks as an outpatient.  He was recently admitted for pneumonia and heart failure exacerbation 2 weeks ago.   Stroke Acute ischemic stroke suspected and aborted with TNK MRI negative for stroke.  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 ASPECTS 10.    CTA head & neck No LVO 24 hr Post TNK CT No acute intracranial hemorrhage or infarct. MRI  No acute infarction, hemorrhage, or mass. Minor chronic microvascular ischemic changes.No large vessel occlusion or hemodynamically significant stenosis. 2D Echo EF 60-65% and no shunt LDL 61 HgbA1c 6.9 VTE prophylaxis - SCDs and ASA s/p TNK     Diet   Diet heart healthy/carb modified Room service appropriate? Yes; Fluid consistency: Thin   aspirin 81 mg daily prior to admission, now on aspirin 81 mg daily.  Therapy recommendations:  HH PT/OT/SLP Disposition:  Home with [place] services  Hypertension Home meds:  Norvasc '10mg'$  daily, hydralazine '100mg'$  TID, lisinopril 10 mg daily and Toprol-XL 50 mg QHS Stable Permissive hypertension (OK if <  220/120) but gradually normalize in 5-7 days Long-term BP goal normotensive  Hyperlipidemia Home meds:  Lipitor '40mg'$  daily, not resumed on admission High intensity statin not indicated.  LDL 61, goal < 70  Diabetes type II Controlled Home meds:  Glipizide '5mg'$  QD, Lantus 40units SQ QHS HgbA1c 6.9, goal < 7.0 CBGs Recent Labs    07/03/21 1103 07/03/21 2152 07/04/21 1121  GLUCAP 191* 161* 81    SSI  Other Stroke Risk Factors  Coronary artery disease   Hospital day # 1  Sonia Side, AGNP-BC [facility] [phone]  From 7a-7p please page over night on-call Neurologist   STROKE MD NOTE :  I have  personally obtained history,examined this patient, reviewed notes, independently viewed imaging studies, participated in medical decision making and plan of care.ROS completed by me personally and pertinent positives fully documented  I have made any additions or clarifications directly to the above note. Agree with note above.  Patient presented with sudden onset of vertigo and nausea when he laid in the head low position which lasted for several hours and improved after receiving IV TNK for suspected brainstem stroke.  MRI is negative for acute stroke.  Continue close neurological monitoring and strict blood pressure control as per postthrombolytic protocol.  Mobilize out of bed.  Physical therapy consult for vestibular stabilization exercises.  Continue ongoing stroke work-up.  Repeat follow-up CT scan in 24 hours.  Post thrombolysis.  Aspirin if no bleed on CT scan.  Transfer to neurology floor bed later today This patient is critically ill and at significant risk of neurological worsening, death and care requires constant monitoring of vital signs, hemodynamics,respiratory and cardiac monitoring, extensive review of multiple databases, frequent neurological assessment, discussion with family, other specialists and medical decision making of high complexity.I have made any additions or clarifications directly to the above note.This critical care time does not reflect procedure time, or teaching time or supervisory time of PA/NP/Med Resident etc but could involve care discussion time.  I spent 30 minutes of neurocritical care time  in the care of  this patient.      Antony Contras, MD Medical Director [place] Pager: [phone] 07/04/2021 4:37 PM   To contact Stroke Continuity provider, please refer to [email or web address] After hours, contact General Neurology

## 2021-07-04 NOTE — TOC CAGE-AID Note (Signed)
Transition of Care [facility]) - CAGE-AID Screening   Patient Details  Name: HRISHIKESH HOEG MRN: [id number] Date of Birth: [birth year]  Transition of Care [facility] CM/SW Contact:    Coralee Pesa, [place] Phone Number: 07/04/2021, 11:52 AM   Clinical Narrative: CSW met with pt at bedside to complete CAGE- AID assessment. Pt notes he was at Dr. Elmer Sow and notes he has never used alcohol or drugs.   CAGE-AID Screening:    Have You Ever Felt You Ought to Cut Down on Your Drinking or Drug Use?: No Have People Annoyed You By Critizing Your Drinking Or Drug Use?: No Have You Felt Bad Or Guilty About Your Drinking Or Drug Use?: No Have You Ever Had a Drink or Used Drugs First Thing In The Morning to Steady Your Nerves or to Get Rid of a Hangover?: No CAGE-AID Score: 0  Substance Abuse Education Offered: No

## 2021-07-05 DIAGNOSIS — I639 Cerebral infarction, unspecified: Secondary | ICD-10-CM | POA: Diagnosis not present

## 2021-07-05 LAB — GLUCOSE, CAPILLARY
Glucose-Capillary: 52 mg/dL — ABNORMAL LOW (ref 70–99)
Glucose-Capillary: 60 mg/dL — ABNORMAL LOW (ref 70–99)
Glucose-Capillary: 75 mg/dL (ref 70–99)
Glucose-Capillary: 101 mg/dL — ABNORMAL HIGH (ref 70–99)
Glucose-Capillary: 76 mg/dL (ref 70–99)

## 2021-07-05 MED ORDER — CLOPIDOGREL BISULFATE 75 MG PO TABS: 75.0000 mg | ORAL_TABLET | Freq: Every day | ORAL | 1 refills | Status: DC

## 2021-07-05 MED ORDER — CLOPIDOGREL BISULFATE 75 MG PO TABS: 75.0000 mg | ORAL_TABLET | Freq: Every day | ORAL | Status: DC

## 2021-07-05 MED ORDER — LANTUS SOLOSTAR 100 UNIT/ML ~~LOC~~ SOPN: 25.0000 [IU] | PEN_INJECTOR | Freq: Every day | SUBCUTANEOUS | 11 refills | Status: DC

## 2021-07-05 NOTE — Discharge Summary (Addendum)
Stroke Discharge Summary  Patient ID: Ryan Jenkins   MRN: [id number]      DOB: Sep 06, 1960  Date of Admission: 07/03/2021 Date of Discharge: 07/05/2021  Attending Physician:  Stroke, Md, MD, Stroke MD Consultant(s):    None  Patient's PCP:  Armanda Heritage, NP  DISCHARGE DIAGNOSIS: Strokelike episode treated with TNK with MRI negative for stroke Principal Problem:   Acute ischemic stroke [facility]) Vertigo Peripheral vestibular dysfunction   Allergies as of 07/05/2021       Reactions   Claritin [loratadine] Swelling   Joint swelling   Hydrochlorothiazide Other (See Comments)   Dizziness   Latex Hives   Lyrica [pregabalin] Other (See Comments)   depression   Metformin And Related Other (See Comments)   GI        Medication List     STOP taking these medications    aspirin EC 81 MG tablet   glipiZIDE 5 MG tablet Commonly known as: GLUCOTROL       TAKE these medications    acetaminophen 500 MG tablet Commonly known as: TYLENOL Take 1,000 mg by mouth every 6 (six) hours as needed for mild pain or headache.   amLODipine 10 MG tablet Commonly known as: NORVASC Take 1 tablet (10 mg total) by mouth daily.   atorvastatin 40 MG tablet Commonly known as: LIPITOR Take 1 tablet (40 mg total) by mouth daily.   Blood Pressure Monitor Devi Use as directed to check home blood pressure 2-3 times a week   clopidogrel 75 MG tablet Commonly known as: PLAVIX Take 1 tablet (75 mg total) by mouth daily.   D-Mannose 350 MG Caps Take 1,050 capsules by mouth in the morning and at bedtime.   escitalopram 20 MG tablet Commonly known as: LEXAPRO Take 1 tablet (20 mg total) by mouth daily.   finasteride 5 MG tablet Commonly known as: PROSCAR Take 1 tablet (5 mg total) by mouth daily.   furosemide 40 MG tablet Commonly known as: Lasix Take 1 tablet (40 mg total) by mouth 2 (two) times daily.   gabapentin 300 MG capsule Commonly known as: NEURONTIN Take 300 mg by  mouth 3 (three) times daily.   hydrALAZINE 100 MG tablet Commonly known as: APRESOLINE Take 1 tablet (100 mg total) by mouth 3 (three) times daily.   Lantus SoloStar 100 UNIT/ML Solostar Pen Generic drug: insulin glargine Inject 25 Units into the skin at bedtime. What changed: how much to take   lisinopril 10 MG tablet Commonly known as: ZESTRIL Take 10 mg by mouth daily.   metoprolol succinate 50 MG 24 hr tablet Commonly known as: TOPROL-XL Take 1 tablet (50 mg total) by mouth at bedtime. Take with or immediately following a meal.   nortriptyline 25 MG capsule Commonly known as: PAMELOR Take 1 capsule (25 mg total) by mouth at bedtime.   pantoprazole 40 MG tablet Commonly known as: PROTONIX Take 1 tablet (40 mg total) by mouth daily.   potassium chloride 10 MEQ tablet Commonly known as: KLOR-CON M Take 1 tablet (10 mEq total) by mouth daily.   tamsulosin 0.4 MG Caps capsule Commonly known as: FLOMAX Take 0.4 mg by mouth at bedtime.   traZODone 50 MG tablet Commonly known as: DESYREL Take 3 tablets (150 mg total) by mouth at bedtime. What changed:  how much to take when to take this reasons to take this   True Metrix Blood Glucose Test test strip Generic drug: glucose blood Use as  instructed   True Metrix Meter w/Device Kit Use as directed   TRUEplus Insulin Syringe 31G X 5/16" 0.3 ML Misc Generic drug: Insulin Syringe-Needle U-100 Use to inject Levemir at bedtime.   TRUEplus Lancets 28G Misc Use as directed        LABORATORY STUDIES CBC    Component Value Date/Time   WBC 5.1 07/04/2021 0601   RBC 3.29 (L) 07/04/2021 0601   HGB 9.7 (L) 07/04/2021 0601   HGB 10.9 (L) 07/13/2020 1208   HCT 28.6 (L) 07/04/2021 0601   HCT 34.6 (L) 07/13/2020 1208   PLT 231 07/04/2021 0601   PLT 395 07/13/2020 1208   MCV 86.9 07/04/2021 0601   MCV 94 07/13/2020 1208   MCH 29.5 07/04/2021 0601   MCHC 33.9 07/04/2021 0601   RDW 14.7 07/04/2021 0601   RDW 15.1  07/13/2020 1208   LYMPHSABS 1.1 07/03/2021 1110   LYMPHSABS 2.6 08/07/2017 1424   MONOABS 0.4 07/03/2021 1110   EOSABS 0.3 07/03/2021 1110   EOSABS 0.2 08/07/2017 1424   BASOSABS 0.1 07/03/2021 1110   BASOSABS 0.0 08/07/2017 1424   CMP    Component Value Date/Time   NA 137 07/04/2021 0601   NA 139 07/13/2020 1208   K 4.1 07/04/2021 0601   CL 109 07/04/2021 0601   CO2 21 (L) 07/04/2021 0601   GLUCOSE 64 (L) 07/04/2021 0601   BUN 34 (H) 07/04/2021 0601   BUN 25 07/13/2020 1208   CREATININE 2.64 (H) 07/04/2021 0601   CALCIUM 8.4 (L) 07/04/2021 0601   PROT 7.6 07/03/2021 1110   PROT 7.7 12/25/2018 1111   ALBUMIN 3.4 (L) 07/03/2021 1110   ALBUMIN 4.2 12/25/2018 1111   AST 27 07/03/2021 1110   ALT 25 07/03/2021 1110   ALKPHOS 88 07/03/2021 1110   BILITOT 1.4 (H) 07/03/2021 1110   BILITOT 0.3 12/25/2018 1111   GFRNONAA 27 (L) 07/04/2021 0601   GFRAA 45 (L) 08/07/2019 1215   COAGS Lab Results  Component Value Date   INR 1.0 07/03/2021   Lipid Panel    Component Value Date/Time   CHOL 104 07/04/2021 0601   CHOL 172 02/25/2018 1657   TRIG 28 07/04/2021 0601   HDL 37 (L) 07/04/2021 0601   HDL 39 (L) 02/25/2018 1657   CHOLHDL 2.8 07/04/2021 0601   VLDL 6 07/04/2021 0601   LDLCALC 61 07/04/2021 0601   LDLCALC 101 (H) 02/25/2018 1657   HgbA1C  Lab Results  Component Value Date   HGBA1C 6.9 (H) 07/04/2021   Urinalysis    Component Value Date/Time   COLORURINE YELLOW 07/03/2021 1701   APPEARANCEUR HAZY (A) 07/03/2021 1701   LABSPEC 1.013 07/03/2021 1701   PHURINE 6.0 07/03/2021 1701   GLUCOSEU 50 (A) 07/03/2021 1701   HGBUR SMALL (A) 07/03/2021 1701   BILIRUBINUR NEGATIVE 07/03/2021 1701   KETONESUR NEGATIVE 07/03/2021 1701   PROTEINUR >=300 (A) 07/03/2021 1701   NITRITE NEGATIVE 07/03/2021 1701   LEUKOCYTESUR LARGE (A) 07/03/2021 1701   Urine Drug Screen     Component Value Date/Time   LABOPIA NONE DETECTED 07/03/2021 1701   COCAINSCRNUR NONE DETECTED  07/03/2021 1701   LABBENZ NONE DETECTED 07/03/2021 1701   AMPHETMU NONE DETECTED 07/03/2021 1701   THCU NONE DETECTED 07/03/2021 1701   LABBARB NONE DETECTED 07/03/2021 1701    Alcohol Level    Component Value Date/Time   ETH <10 07/03/2021 1329     SIGNIFICANT DIAGNOSTIC STUDIES CT HEAD WO CONTRAST (5MM)  Result Date: 07/04/2021 CLINICAL  DATA:  Stroke suspected following TNK administration EXAM: CT HEAD WITHOUT CONTRAST TECHNIQUE: Contiguous axial images were obtained from the base of the skull through the vertex without intravenous contrast. RADIATION DOSE REDUCTION: This exam was performed according to the departmental dose-optimization program which includes automated exposure control, adjustment of the mA and/or kV according to patient size and/or use of iterative reconstruction technique. COMPARISON:  CT and brain MRI dated 1 day prior FINDINGS: Brain: There is no acute intracranial hemorrhage, extra-axial fluid collection, or acute infarct. Parenchymal volume is normal. The ventricles are normal in size. Gray-white differentiation is preserved. There is no mass lesion.  There is no mass effect or midline shift. Vascular: No hyperdense vessel or unexpected calcification. Skull: Normal. Negative for fracture or focal lesion. Sinuses/Orbits: The paranasal sinuses are clear. Bilateral lens implants are in place. The globes and orbits are otherwise unremarkable. Other: None. IMPRESSION: No acute intracranial hemorrhage or infarct. Electronically Signed   By: Valetta Mole M.D.   On: 07/04/2021 12:18   MR ANGIO HEAD WO CONTRAST  Result Date: 07/03/2021 CLINICAL DATA:  Neuro deficit, acute, stroke suspected acute onset vertigo; Neuro deficit, acute, stroke suspected EXAM: MRI HEAD WITHOUT CONTRAST MRA HEAD WITHOUT CONTRAST MRA NECK WITHOUT CONTRAST TECHNIQUE: Multiplanar, multiecho pulse sequences of the brain and surrounding structures were obtained without intravenous contrast. Angiographic  images of the Circle of Willis were obtained using MRA technique without intravenous contrast. Angiographic images of the neck were obtained using MRA technique without intravenous contrast. Carotid stenosis measurements (when applicable) are obtained utilizing NASCET criteria, using the distal internal carotid diameter as the denominator. COMPARISON:  None Available. FINDINGS: MRI HEAD Motion artifact is present. Brain: There is no acute infarction or intracranial hemorrhage. There is no intracranial mass, mass effect, or edema. There is no hydrocephalus or extra-axial fluid collection. Prominence of the ventricles and sulci reflects minor parenchymal volume loss. Patchy T2 hyperintensity in the supratentorial white matter is nonspecific but may reflect minor chronic microvascular ischemic changes. Vascular: Major vessel flow voids at the skull base are preserved. Skull and upper cervical spine: Normal marrow signal is preserved. Sinuses/Orbits: Paranasal sinuses are aerated. Orbits are unremarkable. Other: Sella is unremarkable.  Mastoid air cells are clear. MRA HEAD Motion artifact is present. Intracranial internal carotid arteries are patent. Middle and anterior cerebral arteries are patent. 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 Intracranial vertebral arteries, basilar artery, posterior cerebral arteries are patent. There is no significant stenosis or aneurysm. MRA NECK Motion artifact is present. Common, internal, and external carotid arteries are patent. Extracranial vertebral arteries are patent. Right vertebral is dominant. No hemodynamically significant stenosis. IMPRESSION: Motion degraded. No acute infarction, hemorrhage, or mass. Minor chronic microvascular ischemic changes. No large vessel occlusion or hemodynamically significant stenosis. Electronically Signed   By: Macy Mis M.D.   On: 07/03/2021 14:56   MR ANGIO NECK WO CONTRAST  Result Date: 07/03/2021 CLINICAL DATA:  Neuro  deficit, acute, stroke suspected acute onset vertigo; Neuro deficit, acute, stroke suspected EXAM: MRI HEAD WITHOUT CONTRAST MRA HEAD WITHOUT CONTRAST MRA NECK WITHOUT CONTRAST TECHNIQUE: Multiplanar, multiecho pulse sequences of the brain and surrounding structures were obtained without intravenous contrast. Angiographic images of the Circle of Willis were obtained using MRA technique without intravenous contrast. Angiographic images of the neck were obtained using MRA technique without intravenous contrast. Carotid stenosis measurements (when applicable) are obtained utilizing NASCET criteria, using the distal internal carotid diameter as the denominator. COMPARISON:  None Available. FINDINGS: MRI HEAD  Motion artifact is present. Brain: There is no acute infarction or intracranial hemorrhage. There is no intracranial mass, mass effect, or edema. There is no hydrocephalus or extra-axial fluid collection. Prominence of the ventricles and sulci reflects minor parenchymal volume loss. Patchy T2 hyperintensity in the supratentorial white matter is nonspecific but may reflect minor chronic microvascular ischemic changes. Vascular: Major vessel flow voids at the skull base are preserved. Skull and upper cervical spine: Normal marrow signal is preserved. Sinuses/Orbits: Paranasal sinuses are aerated. Orbits are unremarkable. Other: Sella is unremarkable.  Mastoid air cells are clear. MRA HEAD Motion artifact is present. Intracranial internal carotid arteries are patent. Middle and anterior cerebral arteries are patent. 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 Intracranial vertebral arteries, basilar artery, posterior cerebral arteries are patent. There is no significant stenosis or aneurysm. MRA NECK Motion artifact is present. Common, internal, and external carotid arteries are patent. Extracranial vertebral arteries are patent. Right vertebral is dominant. No hemodynamically significant stenosis. IMPRESSION:  Motion degraded. No acute infarction, hemorrhage, or mass. Minor chronic microvascular ischemic changes. No large vessel occlusion or hemodynamically significant stenosis. Electronically Signed   By: Macy Mis M.D.   On: 07/03/2021 14:56   MR BRAIN WO CONTRAST  Result Date: 07/03/2021 CLINICAL DATA:  Neuro deficit, acute, stroke suspected acute onset vertigo; Neuro deficit, acute, stroke suspected EXAM: MRI HEAD WITHOUT CONTRAST MRA HEAD WITHOUT CONTRAST MRA NECK WITHOUT CONTRAST TECHNIQUE: Multiplanar, multiecho pulse sequences of the brain and surrounding structures were obtained without intravenous contrast. Angiographic images of the Circle of Willis were obtained using MRA technique without intravenous contrast. Angiographic images of the neck were obtained using MRA technique without intravenous contrast. Carotid stenosis measurements (when applicable) are obtained utilizing NASCET criteria, using the distal internal carotid diameter as the denominator. COMPARISON:  None Available. FINDINGS: MRI HEAD Motion artifact is present. Brain: There is no acute infarction or intracranial hemorrhage. There is no intracranial mass, mass effect, or edema. There is no hydrocephalus or extra-axial fluid collection. Prominence of the ventricles and sulci reflects minor parenchymal volume loss. Patchy T2 hyperintensity in the supratentorial white matter is nonspecific but may reflect minor chronic microvascular ischemic changes. Vascular: Major vessel flow voids at the skull base are preserved. Skull and upper cervical spine: Normal marrow signal is preserved. Sinuses/Orbits: Paranasal sinuses are aerated. Orbits are unremarkable. Other: Sella is unremarkable.  Mastoid air cells are clear. MRA HEAD Motion artifact is present. Intracranial internal carotid arteries are patent. Middle and anterior cerebral arteries are patent. 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 Intracranial vertebral arteries, basilar  artery, posterior cerebral arteries are patent. There is no significant stenosis or aneurysm. MRA NECK Motion artifact is present. Common, internal, and external carotid arteries are patent. Extracranial vertebral arteries are patent. Right vertebral is dominant. No hemodynamically significant stenosis. IMPRESSION: Motion degraded. No acute infarction, hemorrhage, or mass. Minor chronic microvascular ischemic changes. No large vessel occlusion or hemodynamically significant stenosis. Electronically Signed   By: Macy Mis M.D.   On: 07/03/2021 14:56   CT HEAD WO CONTRAST (5MM)  Result Date: 07/03/2021 CLINICAL DATA:  Neuro deficit, assess for stroke. EXAM: CT HEAD WITHOUT CONTRAST TECHNIQUE: Contiguous axial images were obtained from the base of the skull through the vertex without intravenous contrast. RADIATION DOSE REDUCTION: This exam was performed according to the departmental dose-optimization program which includes automated exposure control, adjustment of the mA and/or kV according to patient size and/or use of iterative reconstruction technique. COMPARISON:  July 03, 2021, 11:22 a.m. FINDINGS: Brain: There is low-density in the left midbrain and low-density in the right cerebellum. There is no midline shift hydrocephalus or mass. No acute hemorrhage is identified. Vascular: No hyperdense vessel is noted. Skull: Normal. Negative for fracture or focal lesion. Sinuses/Orbits: No acute finding. Other: None. IMPRESSION: There is low-density in the left midbrain and low-density in the right cerebellum. This is suspicious for acute infarct. Further evaluation with MRI is recommended. Electronically Signed   By: Abelardo Diesel M.D.   On: 07/03/2021 13:22   CT HEAD CODE STROKE WO CONTRAST  Result Date: 07/03/2021 CLINICAL DATA:  Code stroke. EXAM: CT HEAD WITHOUT CONTRAST TECHNIQUE: Contiguous axial images were obtained from the base of the skull through the vertex without intravenous contrast. RADIATION  DOSE REDUCTION: This exam was performed according to the departmental dose-optimization program which includes automated exposure control, adjustment of the mA and/or kV according to patient size and/or use of iterative reconstruction technique. COMPARISON:  Brain MRI 07/23/2019 FINDINGS: Brain: There is no acute intracranial hemorrhage, extra-axial fluid collection, or acute infarct. Parenchymal volume is normal. The ventricles are normal in size. Gray-white differentiation is preserved. There is no mass lesion.  There is no mass effect or midline shift. Vascular: No dense vessel is seen. There is calcification of the bilateral cavernous ICAs. Skull: Normal. Negative for fracture or focal lesion. Sinuses/Orbits: The paranasal sinuses are clear. Bilateral lens implants are in place. The globes and orbits are otherwise unremarkable. Other: None. ASPECTS [facility] Stroke Program Early CT Score) - Ganglionic level infarction (caudate, lentiform nuclei, internal capsule, insula, M1-M3 cortex): 7 - Supraganglionic infarction (M4-M6 cortex): 3 Total score (0-10 with 10 being normal): 10 IMPRESSION: 1. No acute intracranial pathology. 2. ASPECTS is 10 These results were called by telephone at the time of interpretation on 07/03/2021 at 11:30 am to provider Dr Quinn Axe, who verbally acknowledged these results. Electronically Signed   By: Valetta Mole M.D.   On: 07/03/2021 11:33   US RENAL  Result Date: 06/06/2021 CLINICAL DATA:  Inpatient. Renal failure. Stage 4 chronic kidney disease. EXAM: RENAL / URINARY TRACT ULTRASOUND COMPLETE COMPARISON:  02/05/2021 renal sonogram. FINDINGS: Right Kidney: Renal measurements: 11.2 x 6.1 x 6.4 cm = volume: 229 mL. Limited visualization of the lower pole due to overlying bowel gas. Echogenic right renal parenchyma, normal thickness. No renal masses. No hydronephrosis. Left Kidney: Renal measurements: 11.6 x 7.3 x 6.6 = volume: 295 cc. Echogenic renal parenchyma, normal thickness. No  renal masses. No hydronephrosis. Echogenicity within normal limits. No mass or hydronephrosis visualized. Bladder: Bladder not visualized, either due to empty bladder and/or overlying bowel gas. Other: Trace simple fluid in Morrison's pouch. IMPRESSION: 1. No hydronephrosis. 2. Echogenic normal size kidneys, compatible with reported clinical history of nonspecific acute on chronic renal parenchymal disease. 3. Nonvisualization of the urinary bladder. 4. Trace simple fluid in Morrison's pouch. Electronically Signed   By: Ilona Sorrel M.D.   On: 06/06/2021 15:09   ECHOCARDIOGRAM COMPLETE  Result Date: 06/05/2021    ECHOCARDIOGRAM REPORT   Patient Name:   ROD MAJERUS Date of Exam: 06/05/2021 Medical Rec #:  [id number]         Height:       74.0 in Accession #:    [id number]        Weight:       222.0 lb Date of Birth:  04-07-1960         BSA:  2.272 m Patient Age:    61 years          BP:           134/68 mmHg Patient Gender: M                 HR:           103 bpm. Exam Location:  Inpatient Procedure: 2D Echo, Color Doppler and Cardiac Doppler Indications:    CHF  History:        Patient has prior history of Echocardiogram examinations. CHF,                 CAD; Risk Factors:Hypertension and Diabetes.  Sonographer:    Jyl Heinz Referring Phys: [id number] RAVI PAHWANI IMPRESSIONS  1. Left ventricular ejection fraction, by estimation, is 60 to 65%. The left ventricle has normal function. The left ventricle has no regional wall motion abnormalities. There is moderate concentric left ventricular hypertrophy. Left ventricular diastolic parameters are consistent with Grade II diastolic dysfunction (pseudonormalization).  2. Right ventricular systolic function is normal. The right ventricular size is normal.  3. The mitral valve is normal in structure. Trivial mitral valve regurgitation.  4. The aortic valve is tricuspid. There is mild calcification of the aortic valve. There is mild thickening of the aortic  valve. Aortic valve regurgitation is not visualized. Aortic valve sclerosis/calcification is present, without any evidence of aortic stenosis.  5. The inferior vena cava is normal in size with greater than 50% respiratory variability, suggesting right atrial pressure of 3 mmHg. Comparison(s): Compared to prior TTE in 01/2021, there is no significant change. FINDINGS  Left Ventricle: Left ventricular ejection fraction, by estimation, is 60 to 65%. The left ventricle has normal function. The left ventricle has no regional wall motion abnormalities. The left ventricular internal cavity size was normal in size. There is  moderate concentric left ventricular hypertrophy. Left ventricular diastolic parameters are consistent with Grade II diastolic dysfunction (pseudonormalization). Right Ventricle: The right ventricular size is normal. No increase in right ventricular wall thickness. Right ventricular systolic function is normal. Left Atrium: Left atrial size was normal in size. Right Atrium: Right atrial size was normal in size. Pericardium: Trivial pericardial effusion is present. Mitral Valve: The mitral valve is normal in structure. There is mild thickening of the mitral valve leaflet(s). Trivial mitral valve regurgitation. Tricuspid Valve: The tricuspid valve is normal in structure. Tricuspid valve regurgitation is trivial. Aortic Valve: The aortic valve is tricuspid. There is mild calcification of the aortic valve. There is mild thickening of the aortic valve. Aortic valve regurgitation is not visualized. Aortic valve sclerosis/calcification is present, without any evidence of aortic stenosis. Aortic valve peak gradient measures 6.9 mmHg. Pulmonic Valve: The pulmonic valve was normal in structure. Pulmonic valve regurgitation is trivial. Aorta: The aortic root and ascending aorta are structurally normal, with no evidence of dilitation. Venous: The inferior vena cava is normal in size with greater than 50%  respiratory variability, suggesting right atrial pressure of 3 mmHg. IAS/Shunts: The atrial septum is grossly normal.  LEFT VENTRICLE PLAX 2D LVIDd:         5.00 cm      Diastology LVIDs:         3.40 cm      LV e' medial:    6.64 cm/s LV PW:         1.00 cm      LV E/e' medial:  18.5 LV IVS:  1.20 cm      LV e' lateral:   8.81 cm/s LVOT diam:     2.10 cm      LV E/e' lateral: 14.0 LV SV:         55 LV SV Index:   24 LVOT Area:     3.46 cm  LV Volumes (MOD) LV vol d, MOD A2C: 145.0 ml LV vol d, MOD A4C: 153.0 ml LV vol s, MOD A2C: 62.7 ml LV vol s, MOD A4C: 65.5 ml LV SV MOD A2C:     82.3 ml LV SV MOD A4C:     153.0 ml LV SV MOD BP:      85.5 ml RIGHT VENTRICLE            IVC RV Basal diam:  3.70 cm    IVC diam: 1.70 cm RV S prime:     9.95 cm/s TAPSE (M-mode): 1.8 cm LEFT ATRIUM           Index        RIGHT ATRIUM           Index LA diam:      4.50 cm 1.98 cm/m   RA Area:     15.50 cm LA Vol (A2C): 56.8 ml 25.00 ml/m  RA Volume:   39.20 ml  17.25 ml/m LA Vol (A4C): 71.2 ml 31.34 ml/m  AORTIC VALVE AV Area (Vmax): 2.48 cm AV Vmax:        131.00 cm/s AV Peak Grad:   6.9 mmHg LVOT Vmax:      93.80 cm/s LVOT Vmean:     71.300 cm/s LVOT VTI:       0.159 m  AORTA Ao Root diam: 3.40 cm Ao Asc diam:  3.30 cm MITRAL VALVE MV Area (PHT): 6.07 cm     SHUNTS MV Decel Time: 125 msec     Systemic VTI:  0.16 m MV E velocity: 123.00 cm/s  Systemic Diam: 2.10 cm MV A velocity: 66.10 cm/s MV E/A ratio:  1.86 Gwyndolyn Kaufman MD Electronically signed by Gwyndolyn Kaufman MD Signature Date/Time: 06/05/2021/2:51:43 PM    Final    DG CHEST PORT 1 VIEW  Result Date: 06/05/2021 CLINICAL DATA:  Shortness of breath. History of congestive heart failure, diabetes, hypertension EXAM: PORTABLE CHEST 1 VIEW COMPARISON:  Chest x-ray from yesterday FINDINGS: Mild cardiomegaly. There are confluent opacities seen at the perihilar regions greater on the right side predominantly in the right middle and lower lobes likely on the basis  of worsening pulmonary edema. Superimposed pneumonia cannot be entirely excluded. No pleural effusion. The visualized skeletal structures are unremarkable. IMPRESSION: Perihilar batwing kind of opacities greater on the right likely on the basis of pulmonary edema and has worsened in the interim. Superimposed pneumonia cannot be excluded. Electronically Signed   By: Frazier Richards M.D.   On: 06/05/2021 13:01      HISTORY OF PRESENT ILLNESS Patient with a hisotyr of CAD, CHF, CKD stage IV, DM, neuropathy, HTN, diabetic retinopathy, BPH, B12 deficiency and chronic right eye blindness presents with acute onset vertigo with nausea and vomiting.   HOSPITAL COURSE Patient's symptoms were intially though to have been caused by a stroke, and TNK was administered.  However, MRI was negative for acute stroke.  Patient's symptoms have resolved and he is now ready for discharge.   Strokelike episode MRI negative for stroke.  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 ASPECTS 10.  CTA head & neck No LVO 24 hr Post TNK CT No acute intracranial hemorrhage or infarct. MRI  No acute infarction, hemorrhage, or mass. Minor chronic microvascular ischemic changes.No large vessel occlusion or hemodynamically significant stenosis. 2D Echo EF 60-65% and no shunt LDL 61 HgbA1c 6.9 VTE prophylaxis - SCDs and ASA s/p TNK        Diet    Diet heart healthy/carb modified Room service appropriate? Yes; Fluid consistency: Thin        aspirin 81 mg daily prior to admission, now on aspirin 81 mg daily.     Hypertension Home meds:  Norvasc 70m daily, hydralazine 1070mTID, lisinopril 10 mg daily and Toprol-XL 50 mg QHS Stable Permissive hypertension (OK if < 220/120) but gradually normalize in 5-7 days Long-term BP goal normotensive   Hyperlipidemia Home meds:  Lipitor 4072maily, not resumed on admission High intensity statin not indicated.  LDL 61, goal  < 70   Diabetes type II Controlled Home meds:  Glipizide 5mg29m, Lantus 40units SQ QHS HgbA1c 6.9, goal < 7.0 CBGs Recent Labs (last 2 labs)        Recent Labs    07/03/21 1103 07/03/21 2152 07/04/21 1121  GLUCAP 191* 161* 81      SSI   Other Stroke Risk Factors   Coronary artery disease  RN Pressure Injury Documentation:     DISCHARGE EXAM Blood pressure (!) 167/94, pulse 81, temperature 98 F (36.7 C), temperature source Oral, resp. rate 17, height _0  (1.88 m), weight 95.3 kg, SpO2 100 %. General:  Alert, well-developed, well-nourished patient in no acute distress Respiratory:  Regular, unlabored respirations on room air  NEURO:  Mental Status: AA&Ox3  Speech/Language: speech is without dysarthria or aphasia.  Fluency, and comprehension intact.  Cranial Nerves:  II: PERRL. Blind in right eye III, IV, VI: EOMI. Eyelids elevate symmetrically.  V: Sensation is intact to light touch and symmetrical to face.  VII: Smile is symmetrical.  VIII: hearing intact to voice. IX, X: Phonation is normal. XII: tongue is midline without fasciculations. Motor: 5/5 strength to all muscle groups tested.  Sensation- Intact to light touch bilaterally. Coordination: FTN intact bilaterally, HKS: no ataxia in BLE.No drift.  Gait- deferred   Discharge Diet       Diet   Diet heart healthy/carb modified Room service appropriate? Yes; Fluid consistency: Thin   liquids  DISCHARGE PLAN Disposition:  home clopidogrel 75 mg daily for secondary stroke prevention Ongoing stroke risk factor control by Primary Care Physician at time of discharge Follow-up PCP EdwaArmanda Heritage in 2 weeks. [facility] in 4 weeks, office to schedule an appointment.   32 minutes were spent preparing discharge.  CortGainesSN, AGACNP-BC [facility] See Amion for schedule and pager information 07/05/2021 12:40 PM   I have  personally obtained history,examined this patient, reviewed notes, independently viewed imaging studies, participated in medical decision making and plan of care.ROS completed by me personally and pertinent positives fully documented  I have made any additions or clarifications directly to the above note. Agree with note above.    PramAntony Contras Medical Director [facility] Pager: [phone]/2023 2:52 PM

## 2021-07-05 NOTE — Discharge Instructions (Signed)
Ryan Jenkins, you were admitted with acute onset vertigo with nausea and vomiting.  Your symptoms were thought to be caused by a stroke, and you were given TNK to treat this.  However, your MRI was negative for acute stroke.  Your symptoms have resolved, and you are ready to be discharged.

## 2021-07-05 NOTE — Progress Notes (Signed)
Physical Therapy Treatment Patient Details Name: Ryan Jenkins MRN: [id number] DOB: 12-Jul-1960 Today's Date: 07/05/2021   History of Present Illness This is a 61 year old gentleman who presents with acute onset vertigo and nausea with vomiting with dizziness and mild dysarthria.  Admitted as code stroke from urology office where symptoms began; he was given TNK.  Recent hospitalization due to CHF and pneumonia. 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    PT Comments    Pt admitted with above diagnosis. Pt progressing well and scored 19/24 which suggests pt is low risk of falls with use of device. Pt is very safe with device and can withstand challenges to balance with use of device. Pt has equipment. Pt would benefit from a few visits of HHPT and transition to Outpt PT for balance training.   Pt currently with functional limitations due to balance and endurance deficits. Pt will benefit from skilled PT to increase their independence and safety with mobility to allow discharge to the venue listed below.      Recommendations for follow up therapy are one component of a multi-disciplinary discharge planning process, led by the attending physician.  Recommendations may be updated based on patient status, additional functional criteria and insurance authorization.  Follow Up Recommendations  Home health PT (HHPT with transition to Outpt PT for balance training)     Assistance Recommended at Discharge Intermittent Supervision/Assistance  Patient can return home with the following A little help with walking and/or transfers;Assistance with cooking/housework;Assist for transportation;Help with stairs or ramp for entrance;A little help with bathing/dressing/bathroom   Equipment  Recommendations  None recommended by PT    Recommendations for Other Services       Precautions / Restrictions Precautions Precautions: Fall Restrictions Weight Bearing Restrictions: No     Mobility  Bed Mobility Overal bed mobility: Needs Assistance Bed Mobility: Supine to Sit Rolling: Supervision Sidelying to sit: Supervision Supine to sit: Supervision     General bed mobility comments: No assist needed    Transfers Overall transfer level: Needs assistance Equipment used: Rolling walker (2 wheels) Transfers: Sit to/from Stand Sit to Stand: Modified independent (Device/Increase time)           General transfer comment: No assist needed    Ambulation/Gait Ambulation/Gait assistance: Supervision Gait Distance (Feet): 350 Feet Assistive device: Rolling walker (2 wheels) Gait Pattern/deviations: Step-through pattern, Decreased stride length   Gait velocity interpretation: 1.31 - 2.62 ft/sec, indicative of limited community ambulator   General Gait Details: Pt with good safety awareness with RW for gait. No LOB with RW with challenges given.   Stairs             Wheelchair Mobility    Modified Rankin (Stroke Patients Only) Modified Rankin (Stroke Patients Only) Pre-Morbid Rankin Score: No significant disability Modified Rankin: Moderate disability     Balance Overall balance assessment: Needs assistance Sitting-balance support: Feet supported Sitting balance-Leahy Scale: Good     Standing balance support: Single extremity supported, During functional activity Standing balance-Leahy Scale: Fair                   Standardized Balance Assessment Standardized Balance Assessment : Dynamic Gait Index   Dynamic Gait Index Level Surface: Normal Change in Gait Speed: Normal Gait with Horizontal Head Turns: Normal Gait with Vertical Head Turns:  Normal Gait and Pivot Turn: Mild Impairment Step Over Obstacle: Mild Impairment Step Around  Obstacles: Mild Impairment Steps: Moderate Impairment Total Score: 19      Cognition Arousal/Alertness: Awake/alert Behavior During Therapy: WFL for tasks assessed/performed Overall Cognitive Status: Impaired/Different from baseline Area of Impairment: Attention, Problem solving, Following commands                   Current Attention Level: Selective   Following Commands: Follows one step commands consistently, Follows multi-step commands with increased time     Problem Solving: Slow processing General Comments: flat affect but overall WFL with increased time        Exercises Other Exercises Other Exercises: Discussed pursed lip breathing with ambulation    General Comments General comments (skin integrity, edema, etc.): VSS on RA      Pertinent Vitals/Pain Pain Assessment Pain Assessment: No/denies pain Faces Pain Scale: No hurt Breathing: normal Negative Vocalization: none Facial Expression: smiling or inexpressive Body Language: relaxed Consolability: no need to console PAINAD Score: 0    Home Living Family/patient expects to be discharged to:: Private residence Living Arrangements: Other relatives                      Prior Function            PT Goals (current goals can now be found in the care plan section) Acute Rehab PT Goals Patient Stated Goal: return to independent Progress towards PT goals: Progressing toward goals    Frequency    Min 4X/week      PT Plan Current plan remains appropriate    Co-evaluation              AM-PAC PT "6 Clicks" Mobility   Outcome Measure  Help needed turning from your back to your side while in a flat bed without using bedrails?: A Little Help needed moving from lying on your back to sitting on the side of a flat bed without using bedrails?: A Little Help needed moving to and from a bed to a chair (including a wheelchair)?: A Little Help needed standing up from a chair using your arms  (e.g., wheelchair or bedside chair)?: A Little Help needed to walk in hospital room?: A Little Help needed climbing 3-5 steps with a railing? : A Lot 6 Click Score: 17    End of Session Equipment Utilized During Treatment: Gait belt Activity Tolerance: Patient tolerated treatment well Patient left: in chair;with call bell/phone within reach;with chair alarm set Nurse Communication: Mobility status PT Visit Diagnosis: Other symptoms and signs involving the nervous system (R29.898);Dizziness and giddiness (R42);Other abnormalities of gait and mobility (R26.89)     Time: 1583-0940 PT Time Calculation (min) (ACUTE ONLY): 21 min  Charges:  $Gait Training: 8-22 mins                     Chyna Kneece M,PT Acute Rehab Services [place] 07/05/2021, 11:51 AM

## 2021-07-05 NOTE — Progress Notes (Signed)
Inpatient Diabetes Program Recommendations  AACE/ADA: New Consensus Statement on Inpatient Glycemic Control (2015)  Target Ranges:  Prepandial:   less than 140 mg/dL      Peak postprandial:   less than 180 mg/dL (1-2 hours)      Critically ill patients:  140 - 180 mg/dL   Lab Results  Component Value Date   GLUCAP 76 07/05/2021   HGBA1C 6.9 (H) 07/04/2021    Review of Glycemic Control  Latest Reference Range & Units 07/05/21 04:18 07/05/21 04:40 07/05/21 05:02 07/05/21 07:55 07/05/21 11:26  Glucose-Capillary 70 - 99 mg/dL 52 (L) 60 (L) 75 101 (H) 76  Diabetes history: DM 2 Outpatient Diabetes medications:  Glucotrol 5 mg daily Lantus 40 units q HS Current orders for Inpatient glycemic control:  Novolog 0-15 units tid with meals Glucotrol 5 mg daily Semglee 35 units q HS Inpatient Diabetes Program Recommendations:    Note low blood sugars.  Please consider further reduction of Semglee to 20 units q HS.  Also consider d/c of Glucotrol while in the hospital.   Thanks,  Adah Perl, RN, BC-ADM Inpatient Diabetes Coordinator Pager [phone]  (8a-5p)

## 2021-07-06 LAB — URINE CULTURE
Culture: >=100000 — AB
Special Requests: NORMAL

## 2021-07-09 ENCOUNTER — Encounter (HOSPITAL_COMMUNITY): Payer: Self-pay | Admitting: *Deleted

## 2021-07-09 ENCOUNTER — Emergency Department (HOSPITAL_COMMUNITY)
Admission: EM | Admit: 2021-07-09 | Discharge: 2021-07-09 | Disposition: A | Discharge status: Home / Self Care | Payer: Medicaid Other | Attending: Emergency Medicine | Admitting: Emergency Medicine

## 2021-07-09 ENCOUNTER — Emergency Department (HOSPITAL_COMMUNITY): Payer: Medicaid Other

## 2021-07-09 DIAGNOSIS — I509 Heart failure, unspecified: Secondary | ICD-10-CM | POA: Insufficient documentation

## 2021-07-09 DIAGNOSIS — R110 Nausea: Secondary | ICD-10-CM | POA: Diagnosis not present

## 2021-07-09 DIAGNOSIS — Z794 Long term (current) use of insulin: Secondary | ICD-10-CM | POA: Diagnosis not present

## 2021-07-09 DIAGNOSIS — I130 Hypertensive heart and chronic kidney disease with heart failure and stage 1 through stage 4 chronic kidney disease, or unspecified chronic kidney disease: Secondary | ICD-10-CM | POA: Diagnosis not present

## 2021-07-09 DIAGNOSIS — Z79899 Other long term (current) drug therapy: Secondary | ICD-10-CM | POA: Insufficient documentation

## 2021-07-09 DIAGNOSIS — N189 Chronic kidney disease, unspecified: Secondary | ICD-10-CM | POA: Insufficient documentation

## 2021-07-09 DIAGNOSIS — I2510 Atherosclerotic heart disease of native coronary artery without angina pectoris: Secondary | ICD-10-CM | POA: Diagnosis not present

## 2021-07-09 DIAGNOSIS — E1122 Type 2 diabetes mellitus with diabetic chronic kidney disease: Secondary | ICD-10-CM | POA: Insufficient documentation

## 2021-07-09 DIAGNOSIS — Z91040 Latex allergy status: Secondary | ICD-10-CM | POA: Diagnosis not present

## 2021-07-09 DIAGNOSIS — R42 Dizziness and giddiness: Secondary | ICD-10-CM | POA: Diagnosis not present

## 2021-07-09 DIAGNOSIS — R519 Headache, unspecified: Secondary | ICD-10-CM | POA: Diagnosis present

## 2021-07-09 LAB — CBC WITH DIFFERENTIAL/PLATELET
Abs Immature Granulocytes: 0.02 10*3/uL (ref 0.00–0.07)
Basophils Absolute: 0.1 10*3/uL (ref 0.0–0.1)
Basophils Relative: 1 %
Eosinophils Absolute: 0.2 10*3/uL (ref 0.0–0.5)
Eosinophils Relative: 2 %
HCT: 34.8 % — ABNORMAL LOW (ref 39.0–52.0)
Hemoglobin: 11.7 g/dL — ABNORMAL LOW (ref 13.0–17.0)
Immature Granulocytes: 0 %
Lymphocytes Relative: 20 %
Lymphs Abs: 1.3 10*3/uL (ref 0.7–4.0)
MCH: 29.7 pg (ref 26.0–34.0)
MCHC: 33.6 g/dL (ref 30.0–36.0)
MCV: 88.3 fL (ref 80.0–100.0)
Monocytes Absolute: 0.5 10*3/uL (ref 0.1–1.0)
Monocytes Relative: 7 %
Neutro Abs: 4.6 10*3/uL (ref 1.7–7.7)
Neutrophils Relative %: 70 %
Platelets: 343 10*3/uL (ref 150–400)
RBC: 3.94 MIL/uL — ABNORMAL LOW (ref 4.22–5.81)
RDW: 14.8 % (ref 11.5–15.5)
WBC: 6.7 10*3/uL (ref 4.0–10.5)
nRBC: 0 % (ref 0.0–0.2)

## 2021-07-09 LAB — COMPREHENSIVE METABOLIC PANEL
ALT: 27 U/L (ref 0–44)
AST: 21 U/L (ref 15–41)
Albumin: 3.5 g/dL (ref 3.5–5.0)
Alkaline Phosphatase: 92 U/L (ref 38–126)
Anion gap: 9 (ref 5–15)
BUN: 30 mg/dL — ABNORMAL HIGH (ref 8–23)
CO2: 21 mmol/L — ABNORMAL LOW (ref 22–32)
Calcium: 9.1 mg/dL (ref 8.9–10.3)
Chloride: 108 mmol/L (ref 98–111)
Creatinine, Ser: 2.59 mg/dL — ABNORMAL HIGH (ref 0.61–1.24)
GFR, Estimated: 27 mL/min — ABNORMAL LOW (ref >60)
Glucose, Bld: 156 mg/dL — ABNORMAL HIGH (ref 70–99)
Potassium: 4.2 mmol/L (ref 3.5–5.1)
Sodium: 138 mmol/L (ref 135–145)
Total Bilirubin: 0.7 mg/dL (ref 0.3–1.2)
Total Protein: 7.7 g/dL (ref 6.5–8.1)

## 2021-07-09 LAB — URINALYSIS, ROUTINE W REFLEX MICROSCOPIC
Bilirubin Urine: NEGATIVE
Glucose, UA: NEGATIVE mg/dL
Ketones, ur: NEGATIVE mg/dL
Leukocytes,Ua: NEGATIVE
Nitrite: NEGATIVE
Protein, ur: 100 mg/dL — AB
Specific Gravity, Urine: 1.01 (ref 1.005–1.030)
pH: 7 (ref 5.0–8.0)

## 2021-07-09 LAB — TROPONIN I (HIGH SENSITIVITY)
Troponin I (High Sensitivity): 52 ng/L — ABNORMAL HIGH (ref <18)
Troponin I (High Sensitivity): 59 ng/L — ABNORMAL HIGH (ref <18)

## 2021-07-09 LAB — PROTIME-INR
INR: 1 (ref 0.8–1.2)
Prothrombin Time: 13.4 seconds (ref 11.4–15.2)

## 2021-07-09 LAB — MAGNESIUM: Magnesium: 1.7 mg/dL (ref 1.7–2.4)

## 2021-07-09 MED ORDER — METOCLOPRAMIDE HCL 5 MG/ML IJ SOLN
10.0000 mg | Freq: Once | INTRAMUSCULAR | Status: AC
  Administered 2021-07-09: 10 mg via INTRAVENOUS
  Filled 2021-07-09: qty 2

## 2021-07-09 MED ORDER — AMLODIPINE BESYLATE 5 MG PO TABS
10.0000 mg | ORAL_TABLET | Freq: Every day | ORAL | Status: DC
  Administered 2021-07-09: 10 mg via ORAL
  Filled 2021-07-09: qty 2

## 2021-07-09 MED ORDER — METOCLOPRAMIDE HCL 10 MG PO TABS: 10.0000 mg | ORAL_TABLET | Freq: Three times a day (TID) | ORAL | 0 refills | Status: DC | PRN

## 2021-07-09 MED ORDER — DIPHENHYDRAMINE HCL 50 MG/ML IJ SOLN
12.5000 mg | Freq: Once | INTRAMUSCULAR | Status: AC
  Administered 2021-07-09: 12.5 mg via INTRAVENOUS
  Filled 2021-07-09: qty 1

## 2021-07-09 MED ORDER — HYDRALAZINE HCL 25 MG PO TABS
100.0000 mg | ORAL_TABLET | Freq: Three times a day (TID) | ORAL | Status: DC
  Administered 2021-07-09: 100 mg via ORAL
  Filled 2021-07-09: qty 4

## 2021-07-09 MED ORDER — FOSFOMYCIN TROMETHAMINE 3 G PO PACK
3.0000 g | PACK | Freq: Once | ORAL | Status: AC
Start: 2021-07-09 — End: 2021-07-09
  Administered 2021-07-09: 3 g via ORAL
  Filled 2021-07-09: qty 3

## 2021-07-09 MED ORDER — METOPROLOL SUCCINATE ER 25 MG PO TB24
50.0000 mg | ORAL_TABLET | Freq: Every day | ORAL | Status: DC
  Administered 2021-07-09: 50 mg via ORAL
  Filled 2021-07-09: qty 2

## 2021-07-09 MED ORDER — LISINOPRIL 10 MG PO TABS
10.0000 mg | ORAL_TABLET | Freq: Every day | ORAL | Status: DC
  Administered 2021-07-09: 10 mg via ORAL
  Filled 2021-07-09: qty 1

## 2021-07-09 MED ORDER — LACTATED RINGERS IV BOLUS
500.0000 mL | Freq: Once | INTRAVENOUS | Status: AC
  Administered 2021-07-09: 500 mL via INTRAVENOUS

## 2021-07-09 MED ORDER — METOCLOPRAMIDE HCL 10 MG PO TABS: 5.0000 mg | ORAL_TABLET | Freq: Three times a day (TID) | ORAL | 0 refills | Status: DC | PRN

## 2021-07-09 MED ORDER — ONDANSETRON HCL 4 MG/2ML IJ SOLN
4.0000 mg | Freq: Once | INTRAMUSCULAR | Status: AC
  Administered 2021-07-09: 4 mg via INTRAVENOUS
  Filled 2021-07-09: qty 2

## 2021-07-09 MED ORDER — MAGNESIUM SULFATE IN D5W 1-5 GM/100ML-% IV SOLN
1.0000 g | Freq: Once | INTRAVENOUS | Status: AC
  Administered 2021-07-09: 1 g via INTRAVENOUS
  Filled 2021-07-09: qty 100

## 2021-07-09 NOTE — ED Triage Notes (Signed)
Patient  presents to ED via ems from home states he was fine when he went to bed last pm woke up this am with dizziness. C/o left parietal headache , states he was recently in the hospital and worked up for cva however states it was neg and was told he had vertigo and was suppose to be on plavix however hasn't had it filled. Patient is alert and oriented. Denies weakness, c/o nausea yest , no vomiting.

## 2021-08-24 ENCOUNTER — Other Ambulatory Visit (HOSPITAL_COMMUNITY): Payer: Self-pay

## 2021-09-20 ENCOUNTER — Other Ambulatory Visit: Payer: Self-pay | Admitting: Nurse Practitioner

## 2021-09-27 ENCOUNTER — Other Ambulatory Visit: Payer: Self-pay | Admitting: Urology

## 2021-09-29 ENCOUNTER — Encounter (HOSPITAL_BASED_OUTPATIENT_CLINIC_OR_DEPARTMENT_OTHER): Payer: Self-pay | Admitting: Urology

## 2021-10-05 NOTE — Progress Notes (Signed)
Reviewing chart for pre-op interview for surgery scheduled on 10-16-2021 for Dr Abner Greenspan.  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  Reviewed chart w/ anesthesia, Dr Roger Kill MDA ,  stated would be ASA IV due to CVA and other health issues, needs to done at main OR. Called and spoke w/ Coni, OR scheduler for Dr Abner Greenspan, informed pt needs to done at main OR per anesthesia.

## 2021-10-09 NOTE — Patient Instructions (Signed)
DUE TO COVID-19 ONLY TWO VISITORS  (aged 61 and older)  ARE ALLOWED TO COME WITH YOU AND STAY IN THE WAITING ROOM ONLY DURING PRE OP AND PROCEDURE.   **NO VISITORS ARE ALLOWED IN THE SHORT STAY AREA OR RECOVERY ROOM!!**  IF YOU WILL BE ADMITTED INTO THE HOSPITAL YOU ARE ALLOWED ONLY FOUR SUPPORT PEOPLE DURING VISITATION HOURS ONLY (7 AM -8PM)   The support person(s) must pass our screening, gel in and out, and wear a mask at all times, including in the patient's room. Patients must also wear a mask when staff or their support person are in the room. Visitors GUEST BADGE MUST BE WORN VISIBLY  One adult visitor may remain with you overnight and MUST be in the room by 8 P.M.     Your procedure is scheduled on: 10/16/21   Report to [facility] Main Entrance    Report to admitting at : 11:00 AM   Call this number if you have problems the morning of surgery [phone]   Do not eat food :After Midnight.   After Midnight you may have the following liquids until :10:00 AM DAY OF SURGERY  Water Black Coffee (sugar ok, NO MILK/CREAM OR CREAMERS)  Tea (sugar ok, NO MILK/CREAM OR CREAMERS) regular and decaf                             Plain Jell-O (NO RED)                                           Fruit ices (not with fruit pulp, NO RED)                                     Popsicles (NO RED)                                                                  Juice: apple, WHITE grape, WHITE cranberry Sports drinks like Gatorade (NO RED)             FOLLOW BOWEL PREP AND ANY ADDITIONAL PRE OP INSTRUCTIONS YOU RECEIVED FROM YOUR SURGEON'S OFFICE!!!   Oral Hygiene is also important to reduce your risk of infection.                                    Remember - BRUSH YOUR TEETH THE MORNING OF SURGERY WITH YOUR REGULAR TOOTHPASTE   Do NOT smoke after Midnight   Take these medicines the morning of surgery with A SIP OF WATER:  gabapentin,escitalopram,hydralazine,metoprolol,amlodipine,finasteride,pantoprazole.Tylenol as needed.  DO NOT TAKE ANY ORAL DIABETIC MEDICATIONS DAY OF YOUR SURGERY  Bring CPAP mask and tubing day of surgery.                              You may not have any metal on your body including hair pins, jewelry, and body piercing  Do not wear lotions, powders, perfumes/cologne, or deodorant              Men may shave face and neck.   Do not bring valuables to the hospital. [place].   Contacts, dentures or bridgework may not be worn into surgery.   Bring small overnight bag day of surgery.   DO NOT [place]. PHARMACY WILL DISPENSE MEDICATIONS LISTED ON YOUR MEDICATION LIST TO YOU DURING YOUR ADMISSION [place]!    Patients discharged on the day of surgery will not be allowed to drive home.  Someone NEEDS to stay with you for the first 24 hours after anesthesia.   Special Instructions: Bring a copy of your healthcare power of attorney and living will documents         the day of surgery if you haven't scanned them before.              Please read over the following fact sheets you were given: IF YOU HAVE QUESTIONS ABOUT YOUR PRE-OP INSTRUCTIONS PLEASE CALL [phone]     [facility] - Preparing for Surgery Before surgery, you can play an important role.  Because skin is not sterile, your skin needs to be as free of germs as possible.  You can reduce the number of germs on your skin by washing with CHG (chlorahexidine gluconate) soap before surgery.  CHG is an antiseptic cleaner which kills germs and bonds with the skin to continue killing germs even after washing. Please DO NOT use if you have an allergy to CHG or antibacterial soaps.  If your skin becomes reddened/irritated stop using the CHG and inform your nurse when you arrive at Short Stay. Do not shave (including legs and underarms)  for at least 48 hours prior to the first CHG shower.  You may shave your face/neck. Please follow these instructions carefully:  1.  Shower with CHG Soap the night before surgery and the  morning of Surgery.  2.  If you choose to wash your hair, wash your hair first as usual with your  normal  shampoo.  3.  After you shampoo, rinse your hair and body thoroughly to remove the  shampoo.                           4.  Use CHG as you would any other liquid soap.  You can apply chg directly  to the skin and wash                       Gently with a scrungie or clean washcloth.  5.  Apply the CHG Soap to your body ONLY FROM THE NECK DOWN.   Do not use on face/ open                           Wound or open sores. Avoid contact with eyes, ears mouth and genitals (private parts).                       Wash face,  Genitals (private parts) with your normal soap.             6.  Wash thoroughly, paying special attention to the area where your surgery  will be performed.  7.  Thoroughly rinse your body with warm water from the neck down.  8.  DO NOT shower/wash with your normal soap after using and rinsing off  the CHG Soap.                9.  Pat yourself dry with a clean towel.            10.  Wear clean pajamas.            11.  Place clean sheets on your bed the night of your first shower and do not  sleep with pets. Day of Surgery : Do not apply any lotions/deodorants the morning of surgery.  Please wear clean clothes to the hospital/surgery center.  FAILURE TO FOLLOW THESE INSTRUCTIONS MAY RESULT IN THE CANCELLATION OF YOUR SURGERY PATIENT SIGNATURE_________________________________  NURSE SIGNATURE__________________________________  ________________________________________________________________________

## 2021-10-10 ENCOUNTER — Encounter (HOSPITAL_COMMUNITY): Payer: Self-pay

## 2021-10-10 ENCOUNTER — Other Ambulatory Visit: Payer: Self-pay

## 2021-10-10 ENCOUNTER — Encounter (HOSPITAL_COMMUNITY)
Admission: RE | Admit: 2021-10-10 | Discharge: 2021-10-10 | Disposition: A | Discharge status: Home / Self Care | Payer: Medicaid Other | Source: Ambulatory Visit | Attending: Urology | Admitting: Urology

## 2021-10-10 VITALS — BP 150/76 | HR 86 | Temp 98.6°F | Ht 74.0 in | Wt 216.0 lb

## 2021-10-10 DIAGNOSIS — E1122 Type 2 diabetes mellitus with diabetic chronic kidney disease: Secondary | ICD-10-CM | POA: Insufficient documentation

## 2021-10-10 DIAGNOSIS — Z01812 Encounter for preprocedural laboratory examination: Secondary | ICD-10-CM | POA: Diagnosis present

## 2021-10-10 DIAGNOSIS — I130 Hypertensive heart and chronic kidney disease with heart failure and stage 1 through stage 4 chronic kidney disease, or unspecified chronic kidney disease: Secondary | ICD-10-CM | POA: Insufficient documentation

## 2021-10-10 DIAGNOSIS — I2510 Atherosclerotic heart disease of native coronary artery without angina pectoris: Secondary | ICD-10-CM | POA: Diagnosis not present

## 2021-10-10 DIAGNOSIS — N184 Chronic kidney disease, stage 4 (severe): Secondary | ICD-10-CM | POA: Diagnosis not present

## 2021-10-10 DIAGNOSIS — E1142 Type 2 diabetes mellitus with diabetic polyneuropathy: Secondary | ICD-10-CM | POA: Diagnosis not present

## 2021-10-10 DIAGNOSIS — I10 Essential (primary) hypertension: Secondary | ICD-10-CM

## 2021-10-10 HISTORY — DX: Dyspnea, unspecified: R06.00

## 2021-10-10 HISTORY — DX: Angina pectoris, unspecified: I20.9

## 2021-10-10 HISTORY — DX: Unspecified osteoarthritis, unspecified site: M19.90

## 2021-10-10 HISTORY — DX: Pneumonia, unspecified organism: J18.9

## 2021-10-10 HISTORY — DX: Cardiac murmur, unspecified: R01.1

## 2021-10-10 LAB — HEMOGLOBIN A1C
Hgb A1c MFr Bld: 8.4 % — ABNORMAL HIGH (ref 4.8–5.6)
Mean Plasma Glucose: 194.38 mg/dL

## 2021-10-10 LAB — BASIC METABOLIC PANEL
Anion gap: 8 (ref 5–15)
BUN: 37 mg/dL — ABNORMAL HIGH (ref 8–23)
CO2: 22 mmol/L (ref 22–32)
Calcium: 8.7 mg/dL — ABNORMAL LOW (ref 8.9–10.3)
Chloride: 107 mmol/L (ref 98–111)
Creatinine, Ser: 3.08 mg/dL — ABNORMAL HIGH (ref 0.61–1.24)
GFR, Estimated: 22 mL/min — ABNORMAL LOW (ref >60)
Glucose, Bld: 270 mg/dL — ABNORMAL HIGH (ref 70–99)
Potassium: 3.1 mmol/L — ABNORMAL LOW (ref 3.5–5.1)
Sodium: 137 mmol/L (ref 135–145)

## 2021-10-10 LAB — CBC
HCT: 31.1 % — ABNORMAL LOW (ref 39.0–52.0)
Hemoglobin: 10.4 g/dL — ABNORMAL LOW (ref 13.0–17.0)
MCH: 30.1 pg (ref 26.0–34.0)
MCHC: 33.4 g/dL (ref 30.0–36.0)
MCV: 89.9 fL (ref 80.0–100.0)
Platelets: 356 10*3/uL (ref 150–400)
RBC: 3.46 MIL/uL — ABNORMAL LOW (ref 4.22–5.81)
RDW: 14.4 % (ref 11.5–15.5)
WBC: 8.5 10*3/uL (ref 4.0–10.5)
nRBC: 0 % (ref 0.0–0.2)

## 2021-10-10 LAB — GLUCOSE, CAPILLARY: Glucose-Capillary: 271 mg/dL — ABNORMAL HIGH (ref 70–99)

## 2021-10-10 NOTE — Progress Notes (Signed)
Pharmacy was called several times when the Pt. Was present at PST appointment.Unable to get in contact with pharmacy technician.Pt. was instructed to call [phone].

## 2021-10-10 NOTE — Progress Notes (Addendum)
For Short Stay: [place] appointment date: Date of COVID positive in last 50 days:  Bowel Prep reminder:   For Anesthesia: PCP - Armanda Heritage: NP. LOV: 09/11/21 Cardiologist - Dr. Tiana Loft: LOV: 07/27/21  Chest x-ray - 07/09/21 EKG - 07/09/21 Stress Test -  ECHO -  Cardiac Cath -  Pacemaker/ICD device last checked: Pacemaker orders received: Device Rep notified:  Spinal Cord Stimulator:  Sleep Study -  CPAP -   Fasting Blood Sugar - 100's Checks Blood Sugar __1___ times a day Date and result of last Hgb A1c- 6.9: 07/04/21  Blood Thinner Instructions:Plavix has been held since: 10/06/21 Aspirin Instructions: Last Dose:  Activity level: Can go up a flight of stairs and activities of daily living without stopping and without chest pain and/or shortness of breath   Able to exercise without chest pain and/or shortness of breath   Unable to go up a flight of stairs without chest pain and/or shortness of breath     Anesthesia review: Hx: HTN,DIA,CAD,CHF,CKD IV  Patient denies shortness of breath, fever, cough and chest pain at PAT appointment   Patient verbalized understanding of instructions that were given to them at the PAT appointment. Patient was also instructed that they will need to review over the PAT instructions again at home before surgery.

## 2021-10-10 NOTE — Progress Notes (Signed)
Lab. Results: Creatinine: 3.08 A1C: 8.4

## 2021-10-11 NOTE — Progress Notes (Addendum)
Anesthesia Chart Review   Case: [id number] Date/Time: 10/16/21 1255   Procedure: TRANSURETHRAL RESECTION OF THE PROSTATE (TURP)   Anesthesia type: General   Pre-op diagnosis: BENIGN PROSTATIC HYPERPLASIA   Location: [facility] ROOM 07 / [facility]   Surgeons: Janith Lima, MD       DISCUSSION:61 y.o. never smoker with h/o HTN, CKD Stage IV, DM II insulin dependent, CHF, CAD, BPH scheduled for above procedure 10/16/2021 with Dr. Rexene Alberts.   Pt last seen by cardiology 07/27/2021. 6lb weight gain at this visit.  Lasix increased to 3 times daily for 3 days.  Low risk stress test 05/30/21.   Echo 01/27/2021 with EF 60-65%, no valvular problems.  VS: BP (!) 150/76   Pulse 86   Temp 37 C (Oral)   Ht 6' 2"  (1.88 m)   Wt 98 kg   SpO2 97%   BMI 27.73 kg/m   PROVIDERS: [name], MD is PCP  Carollee Herter, MD is Cardiologist  LABS: Labs reviewed: Acceptable for surgery. (all labs ordered are listed, but only abnormal results are displayed)  Labs Reviewed  HEMOGLOBIN A1C - Abnormal; Notable for the following components:      Result Value   Hgb A1c MFr Bld 8.4 (*)    All other components within normal limits  BASIC METABOLIC PANEL - Abnormal; Notable for the following components:   Potassium 3.1 (*)    Glucose, Bld 270 (*)    BUN 37 (*)    Creatinine, Ser 3.08 (*)    Calcium 8.7 (*)    GFR, Estimated 22 (*)    All other components within normal limits  CBC - Abnormal; Notable for the following components:   RBC 3.46 (*)    Hemoglobin 10.4 (*)    HCT 31.1 (*)    All other components within normal limits  GLUCOSE, CAPILLARY - Abnormal; Notable for the following components:   Glucose-Capillary 271 (*)    All other components within normal limits     IMAGES:   EKG:   CV: Pharmacological nuclear stress test 05/30/2021 Impression:   1. No chest pain with stress. 2. No ST changes to suggest ischemia. 3. Normal LV systolic function and wall motion. 4. No ischemia by  perfusion imaging. 5. Low risk study.  Echo 06/05/2021 1. Left ventricular ejection fraction, by estimation, is 60 to 65%. The  left ventricle has normal function. The left ventricle has no regional  wall motion abnormalities. There is moderate concentric left ventricular  hypertrophy. Left ventricular  diastolic parameters are consistent with Grade II diastolic dysfunction  (pseudonormalization).   2. Right ventricular systolic function is normal. The right ventricular  size is normal.   3. The mitral valve is normal in structure. Trivial mitral valve  regurgitation.   4. The aortic valve is tricuspid. There is mild calcification of the  aortic valve. There is mild thickening of the aortic valve. Aortic valve  regurgitation is not visualized. Aortic valve sclerosis/calcification is  present, without any evidence of  aortic stenosis.   5. The inferior vena cava is normal in size with greater than 50%  respiratory variability, suggesting right atrial pressure of 3 mmHg.  Past Medical History:  Diagnosis Date   Anemia    Anginal pain ([place])    Anxiety    Arthritis    BPH with obstruction/lower urinary tract symptoms    CAD (coronary artery disease)    cardiologist--- dr Junius Roads badal;  11-06-2019 cardiac cath in [place] (  result in care everywhere)  nonobstructive cad involing pRCA 60% (done in setting worseing CHF/ acute pulmonary edema requiring intubation/ AKI   Chronic combined systolic and diastolic CHF (congestive heart failure) (HCC)    Chronic kidney disease, stage IV (severe) ([place])    nephrologist--- dr Carolin Sicks   Depression    Diabetic neuropathy (HCC)    Dyspnea    Edema of both lower extremities    GERD (gastroesophageal reflux disease)    Heart murmur    History of community acquired pneumonia    admission [facility]-04-2021 in [facility]  w/ ARF hypoxia w/ severe sepsis   Hyperlipidemia    Hypertension    Insulin dependent type 2 diabetes mellitus ([place])    Pneumonia     Retinopathy due to secondary diabetes ([place])    Uses walker    Vitamin B12 deficiency    Vitreous hemorrhage ([place])     Past Surgical History:  Procedure Laterality Date   CARDIAC CATHETERIZATION  11/06/2019   [place] in [place];    nonobstructive CAD , pRCA 60% (result in care everywhere)   CATARACT EXTRACTION W/ INTRAOCULAR LENS IMPLANT Bilateral 2017    MEDICATIONS:  acetaminophen (TYLENOL) 500 MG tablet   amLODipine (NORVASC) 10 MG tablet   atorvastatin (LIPITOR) 40 MG tablet   Blood Glucose Monitoring Suppl (TRUE METRIX METER) w/Device KIT   Blood Pressure Monitor DEVI   clopidogrel (PLAVIX) 75 MG tablet   D-Mannose 350 MG CAPS   escitalopram (LEXAPRO) 20 MG tablet   finasteride (PROSCAR) 5 MG tablet   furosemide (LASIX) 40 MG tablet   gabapentin (NEURONTIN) 300 MG capsule   glucose blood (TRUE METRIX BLOOD GLUCOSE TEST) test strip   hydrALAZINE (APRESOLINE) 100 MG tablet   Insulin Syringe-Needle U-100 (TRUEPLUS INSULIN SYRINGE) 31G X 5/16" 0.3 ML MISC   LANTUS SOLOSTAR 100 UNIT/ML Solostar Pen   lisinopril (ZESTRIL) 10 MG tablet   metoCLOPramide (REGLAN) 10 MG tablet   metoprolol succinate (TOPROL-XL) 50 MG 24 hr tablet   nortriptyline (PAMELOR) 25 MG capsule   pantoprazole (PROTONIX) 40 MG tablet   potassium chloride (KLOR-CON M) 10 MEQ tablet   tamsulosin (FLOMAX) 0.4 MG CAPS capsule   traZODone (DESYREL) 50 MG tablet   TRUEplus Lancets 28G MISC   No current facility-administered medications for this encounter.    Konrad Felix Ward, PA-C [facility] [phone]

## 2021-10-12 ENCOUNTER — Other Ambulatory Visit: Payer: Self-pay

## 2021-10-12 NOTE — Patient Outreach (Signed)
  Care Coordination   10/12/2021 Name: Ryan Jenkins MRN: [id number] DOB: 10-10-1960   Telephone outreach to patient to obtain mRS was successfully completed. MRS= [place] Management Assistant [phone]

## 2021-10-16 ENCOUNTER — Encounter (HOSPITAL_COMMUNITY): Payer: Self-pay | Admitting: Urology

## 2021-10-16 ENCOUNTER — Ambulatory Visit (HOSPITAL_COMMUNITY): Payer: Medicaid Other | Admitting: Physician Assistant

## 2021-10-16 ENCOUNTER — Ambulatory Visit (HOSPITAL_COMMUNITY)
Admission: RE | Admit: 2021-10-16 | Discharge: 2021-10-17 | Disposition: A | Discharge status: Home / Self Care | Payer: Medicaid Other | Attending: Urology | Admitting: Urology

## 2021-10-16 ENCOUNTER — Other Ambulatory Visit: Payer: Self-pay

## 2021-10-16 ENCOUNTER — Ambulatory Visit (HOSPITAL_BASED_OUTPATIENT_CLINIC_OR_DEPARTMENT_OTHER): Payer: Medicaid Other | Admitting: Anesthesiology

## 2021-10-16 ENCOUNTER — Encounter (HOSPITAL_COMMUNITY)
Admission: RE | Disposition: A | Discharge status: Home / Self Care | Payer: Self-pay | Source: Home / Self Care | Attending: Urology

## 2021-10-16 DIAGNOSIS — I130 Hypertensive heart and chronic kidney disease with heart failure and stage 1 through stage 4 chronic kidney disease, or unspecified chronic kidney disease: Secondary | ICD-10-CM | POA: Insufficient documentation

## 2021-10-16 DIAGNOSIS — I129 Hypertensive chronic kidney disease with stage 1 through stage 4 chronic kidney disease, or unspecified chronic kidney disease: Secondary | ICD-10-CM

## 2021-10-16 DIAGNOSIS — F418 Other specified anxiety disorders: Secondary | ICD-10-CM | POA: Diagnosis not present

## 2021-10-16 DIAGNOSIS — J80 Acute respiratory distress syndrome: Secondary | ICD-10-CM | POA: Insufficient documentation

## 2021-10-16 DIAGNOSIS — E785 Hyperlipidemia, unspecified: Secondary | ICD-10-CM | POA: Insufficient documentation

## 2021-10-16 DIAGNOSIS — C61 Malignant neoplasm of prostate: Secondary | ICD-10-CM | POA: Diagnosis not present

## 2021-10-16 DIAGNOSIS — E1122 Type 2 diabetes mellitus with diabetic chronic kidney disease: Secondary | ICD-10-CM | POA: Insufficient documentation

## 2021-10-16 DIAGNOSIS — Z794 Long term (current) use of insulin: Secondary | ICD-10-CM | POA: Diagnosis not present

## 2021-10-16 DIAGNOSIS — I2510 Atherosclerotic heart disease of native coronary artery without angina pectoris: Secondary | ICD-10-CM

## 2021-10-16 DIAGNOSIS — N400 Enlarged prostate without lower urinary tract symptoms: Secondary | ICD-10-CM | POA: Diagnosis not present

## 2021-10-16 DIAGNOSIS — Z8673 Personal history of transient ischemic attack (TIA), and cerebral infarction without residual deficits: Secondary | ICD-10-CM | POA: Diagnosis not present

## 2021-10-16 DIAGNOSIS — I5032 Chronic diastolic (congestive) heart failure: Secondary | ICD-10-CM | POA: Insufficient documentation

## 2021-10-16 DIAGNOSIS — N184 Chronic kidney disease, stage 4 (severe): Secondary | ICD-10-CM | POA: Insufficient documentation

## 2021-10-16 DIAGNOSIS — K219 Gastro-esophageal reflux disease without esophagitis: Secondary | ICD-10-CM | POA: Diagnosis not present

## 2021-10-16 DIAGNOSIS — R262 Difficulty in walking, not elsewhere classified: Secondary | ICD-10-CM | POA: Diagnosis not present

## 2021-10-16 DIAGNOSIS — E1142 Type 2 diabetes mellitus with diabetic polyneuropathy: Secondary | ICD-10-CM | POA: Insufficient documentation

## 2021-10-16 DIAGNOSIS — D631 Anemia in chronic kidney disease: Secondary | ICD-10-CM

## 2021-10-16 DIAGNOSIS — N189 Chronic kidney disease, unspecified: Secondary | ICD-10-CM

## 2021-10-16 HISTORY — DX: Hyperlipidemia, unspecified: E78.5

## 2021-10-16 HISTORY — DX: Chronic combined systolic (congestive) and diastolic (congestive) heart failure: I50.42

## 2021-10-16 HISTORY — DX: Localized edema: R60.0

## 2021-10-16 HISTORY — DX: Type 2 diabetes mellitus without complications: E11.9

## 2021-10-16 HISTORY — DX: Dependence on other enabling machines and devices: Z99.89

## 2021-10-16 HISTORY — DX: Benign prostatic hyperplasia with lower urinary tract symptoms: N40.1

## 2021-10-16 HISTORY — PX: TRANSURETHRAL RESECTION OF PROSTATE: SHX73

## 2021-10-16 HISTORY — DX: Type 2 diabetes mellitus without complications: Z79.4

## 2021-10-16 HISTORY — DX: Other obstructive and reflux uropathy: N13.8

## 2021-10-16 HISTORY — DX: Personal history of pneumonia (recurrent): Z87.01

## 2021-10-16 LAB — GLUCOSE, CAPILLARY
Glucose-Capillary: 126 mg/dL — ABNORMAL HIGH (ref 70–99)
Glucose-Capillary: 97 mg/dL (ref 70–99)
Glucose-Capillary: 136 mg/dL — ABNORMAL HIGH (ref 70–99)

## 2021-10-16 LAB — BASIC METABOLIC PANEL
Anion gap: 7 (ref 5–15)
BUN: 36 mg/dL — ABNORMAL HIGH (ref 8–23)
CO2: 22 mmol/L (ref 22–32)
Calcium: 7.8 mg/dL — ABNORMAL LOW (ref 8.9–10.3)
Chloride: 109 mmol/L (ref 98–111)
Creatinine, Ser: 3.26 mg/dL — ABNORMAL HIGH (ref 0.61–1.24)
GFR, Estimated: 21 mL/min — ABNORMAL LOW (ref >60)
Glucose, Bld: 105 mg/dL — ABNORMAL HIGH (ref 70–99)
Potassium: 2.8 mmol/L — ABNORMAL LOW (ref 3.5–5.1)
Sodium: 138 mmol/L (ref 135–145)

## 2021-10-16 LAB — HEMOGLOBIN AND HEMATOCRIT, BLOOD
HCT: 28 % — ABNORMAL LOW (ref 39.0–52.0)
Hemoglobin: 9.1 g/dL — ABNORMAL LOW (ref 13.0–17.0)

## 2021-10-16 SURGERY — TURP (TRANSURETHRAL RESECTION OF PROSTATE)
Anesthesia: General

## 2021-10-16 MED ORDER — OXYCODONE-ACETAMINOPHEN 5-325 MG PO TABS: 1.0000 | ORAL_TABLET | ORAL | 0 refills | Status: DC | PRN

## 2021-10-16 MED ORDER — NORTRIPTYLINE HCL 25 MG PO CAPS
25.0000 mg | ORAL_CAPSULE | Freq: Every day | ORAL | Status: DC
  Administered 2021-10-16: 25 mg via ORAL
  Filled 2021-10-16: qty 1

## 2021-10-16 MED ORDER — PHENYLEPHRINE 80 MCG/ML (10ML) SYRINGE FOR IV PUSH (FOR BLOOD PRESSURE SUPPORT)
PREFILLED_SYRINGE | INTRAVENOUS | Status: AC
  Filled 2021-10-16: qty 10

## 2021-10-16 MED ORDER — 0.9 % SODIUM CHLORIDE (POUR BTL) OPTIME
TOPICAL | Status: DC | PRN
  Administered 2021-10-16: 1000 mL

## 2021-10-16 MED ORDER — OXYCODONE HCL 5 MG PO TABS
ORAL_TABLET | ORAL | Status: AC
  Filled 2021-10-16: qty 1

## 2021-10-16 MED ORDER — PROPOFOL 10 MG/ML IV BOLUS
INTRAVENOUS | Status: AC
  Filled 2021-10-16: qty 20

## 2021-10-16 MED ORDER — AMLODIPINE BESYLATE 10 MG PO TABS
10.0000 mg | ORAL_TABLET | Freq: Every day | ORAL | Status: DC
  Administered 2021-10-16 – 2021-10-17 (×2): 10 mg via ORAL
  Filled 2021-10-16 (×2): qty 1

## 2021-10-16 MED ORDER — CHLORHEXIDINE GLUCONATE 0.12 % MT SOLN
15.0000 mL | Freq: Once | OROMUCOSAL | Status: AC
  Administered 2021-10-16: 15 mL via OROMUCOSAL

## 2021-10-16 MED ORDER — DOCUSATE SODIUM 100 MG PO CAPS
100.0000 mg | ORAL_CAPSULE | Freq: Two times a day (BID) | ORAL | Status: DC
  Administered 2021-10-16 – 2021-10-17 (×2): 100 mg via ORAL
  Filled 2021-10-16 (×2): qty 1

## 2021-10-16 MED ORDER — LIDOCAINE HCL (PF) 2 % IJ SOLN
INTRAMUSCULAR | Status: AC
  Filled 2021-10-16: qty 5

## 2021-10-16 MED ORDER — FENTANYL CITRATE PF 50 MCG/ML IJ SOSY
PREFILLED_SYRINGE | INTRAMUSCULAR | Status: AC
  Filled 2021-10-16: qty 1

## 2021-10-16 MED ORDER — INSULIN ASPART 100 UNIT/ML IJ SOLN
0.0000 [IU] | INTRAMUSCULAR | Status: DC
  Administered 2021-10-16: 2 [IU] via SUBCUTANEOUS
  Administered 2021-10-17: 3 [IU] via SUBCUTANEOUS
  Administered 2021-10-17: 8 [IU] via SUBCUTANEOUS
  Administered 2021-10-17: 3 [IU] via SUBCUTANEOUS
  Administered 2021-10-17: 2 [IU] via SUBCUTANEOUS

## 2021-10-16 MED ORDER — DIPHENHYDRAMINE HCL 50 MG/ML IJ SOLN: 12.5000 mg | Freq: Four times a day (QID) | INTRAMUSCULAR | Status: DC | PRN

## 2021-10-16 MED ORDER — LIDOCAINE 2% (20 MG/ML) 5 ML SYRINGE
INTRAMUSCULAR | Status: DC | PRN
  Administered 2021-10-16: 6 mg via INTRAVENOUS

## 2021-10-16 MED ORDER — PHENYLEPHRINE 80 MCG/ML (10ML) SYRINGE FOR IV PUSH (FOR BLOOD PRESSURE SUPPORT)
PREFILLED_SYRINGE | INTRAVENOUS | Status: DC | PRN
  Administered 2021-10-16: 160 ug via INTRAVENOUS
  Administered 2021-10-16: 80 ug via INTRAVENOUS
  Administered 2021-10-16: 160 ug via INTRAVENOUS
  Administered 2021-10-16 (×3): 80 ug via INTRAVENOUS
  Administered 2021-10-16: 160 ug via INTRAVENOUS

## 2021-10-16 MED ORDER — PROMETHAZINE HCL 25 MG/ML IJ SOLN: 6.2500 mg | INTRAMUSCULAR | Status: DC | PRN

## 2021-10-16 MED ORDER — HYDROMORPHONE HCL 1 MG/ML IJ SOLN
0.5000 mg | INTRAMUSCULAR | Status: DC | PRN
  Administered 2021-10-16: 1 mg via INTRAVENOUS
  Filled 2021-10-16: qty 1

## 2021-10-16 MED ORDER — HYDRALAZINE HCL 50 MG PO TABS
100.0000 mg | ORAL_TABLET | Freq: Three times a day (TID) | ORAL | Status: DC
  Administered 2021-10-16 – 2021-10-17 (×2): 100 mg via ORAL
  Filled 2021-10-16 (×2): qty 2

## 2021-10-16 MED ORDER — DIPHENHYDRAMINE HCL 12.5 MG/5ML PO ELIX: 12.5000 mg | ORAL_SOLUTION | Freq: Four times a day (QID) | ORAL | Status: DC | PRN

## 2021-10-16 MED ORDER — OXYCODONE HCL 5 MG/5ML PO SOLN: 5.0000 mg | Freq: Once | ORAL | Status: AC | PRN

## 2021-10-16 MED ORDER — FENTANYL CITRATE PF 50 MCG/ML IJ SOSY
25.0000 ug | PREFILLED_SYRINGE | INTRAMUSCULAR | Status: DC | PRN
  Administered 2021-10-16 (×3): 50 ug via INTRAVENOUS

## 2021-10-16 MED ORDER — ONDANSETRON HCL 4 MG/2ML IJ SOLN: 4.0000 mg | INTRAMUSCULAR | Status: DC | PRN

## 2021-10-16 MED ORDER — AMLODIPINE BESYLATE 10 MG PO TABS
10.0000 mg | ORAL_TABLET | Freq: Once | ORAL | Status: AC
  Administered 2021-10-16: 10 mg via ORAL
  Filled 2021-10-16: qty 1

## 2021-10-16 MED ORDER — MIDAZOLAM HCL 2 MG/2ML IJ SOLN
INTRAMUSCULAR | Status: AC
  Filled 2021-10-16: qty 2

## 2021-10-16 MED ORDER — ZOLPIDEM TARTRATE 5 MG PO TABS: 5.0000 mg | ORAL_TABLET | Freq: Every evening | ORAL | Status: DC | PRN

## 2021-10-16 MED ORDER — SODIUM CHLORIDE 0.9 % IR SOLN
3000.0000 mL | Status: DC
  Administered 2021-10-16 – 2021-10-17 (×2): 3000 mL

## 2021-10-16 MED ORDER — SODIUM CHLORIDE 0.9% FLUSH: 3.0000 mL | INTRAVENOUS | Status: DC | PRN

## 2021-10-16 MED ORDER — CEPHALEXIN 500 MG PO CAPS: 500.0000 mg | ORAL_CAPSULE | Freq: Two times a day (BID) | ORAL | 0 refills | Status: DC

## 2021-10-16 MED ORDER — SODIUM CHLORIDE 0.9% FLUSH
3.0000 mL | Freq: Two times a day (BID) | INTRAVENOUS | Status: DC
  Administered 2021-10-16: 3 mL via INTRAVENOUS

## 2021-10-16 MED ORDER — METOCLOPRAMIDE HCL 5 MG PO TABS: 5.0000 mg | ORAL_TABLET | Freq: Three times a day (TID) | ORAL | Status: DC | PRN

## 2021-10-16 MED ORDER — OXYBUTYNIN CHLORIDE ER 10 MG PO TB24
10.0000 mg | ORAL_TABLET | Freq: Every day | ORAL | Status: DC
  Administered 2021-10-16 – 2021-10-17 (×2): 10 mg via ORAL
  Filled 2021-10-16 (×2): qty 1

## 2021-10-16 MED ORDER — ACETAMINOPHEN 325 MG PO TABS: 650.0000 mg | ORAL_TABLET | ORAL | Status: DC | PRN

## 2021-10-16 MED ORDER — FENTANYL CITRATE (PF) 100 MCG/2ML IJ SOLN
INTRAMUSCULAR | Status: DC | PRN
  Administered 2021-10-16 (×2): 50 ug via INTRAVENOUS

## 2021-10-16 MED ORDER — OXYCODONE-ACETAMINOPHEN 5-325 MG PO TABS
1.0000 | ORAL_TABLET | ORAL | Status: DC | PRN
  Administered 2021-10-17: 2 via ORAL
  Filled 2021-10-16: qty 2

## 2021-10-16 MED ORDER — OXYCODONE HCL 5 MG PO TABS
5.0000 mg | ORAL_TABLET | Freq: Once | ORAL | Status: AC | PRN
  Administered 2021-10-16: 5 mg via ORAL

## 2021-10-16 MED ORDER — DEXAMETHASONE SODIUM PHOSPHATE 10 MG/ML IJ SOLN
INTRAMUSCULAR | Status: DC | PRN
  Administered 2021-10-16: 4 mg via INTRAVENOUS

## 2021-10-16 MED ORDER — SODIUM CHLORIDE 0.9 % IR SOLN
Status: DC | PRN
  Administered 2021-10-16: 18000 mL

## 2021-10-16 MED ORDER — ESCITALOPRAM OXALATE 20 MG PO TABS
20.0000 mg | ORAL_TABLET | Freq: Every day | ORAL | Status: DC
  Administered 2021-10-16 – 2021-10-17 (×2): 20 mg via ORAL
  Filled 2021-10-16 (×2): qty 1

## 2021-10-16 MED ORDER — PROPOFOL 10 MG/ML IV BOLUS
INTRAVENOUS | Status: DC | PRN
  Administered 2021-10-16: 200 mg via INTRAVENOUS

## 2021-10-16 MED ORDER — ONDANSETRON HCL 4 MG/2ML IJ SOLN
INTRAMUSCULAR | Status: AC
  Filled 2021-10-16: qty 2

## 2021-10-16 MED ORDER — CEFAZOLIN SODIUM-DEXTROSE 2-4 GM/100ML-% IV SOLN
2.0000 g | Freq: Once | INTRAVENOUS | Status: AC
  Administered 2021-10-16: 2 g via INTRAVENOUS
  Filled 2021-10-16: qty 100

## 2021-10-16 MED ORDER — FENTANYL CITRATE PF 50 MCG/ML IJ SOSY
PREFILLED_SYRINGE | INTRAMUSCULAR | Status: AC
  Filled 2021-10-16: qty 2

## 2021-10-16 MED ORDER — ACETAMINOPHEN 500 MG PO TABS
1000.0000 mg | ORAL_TABLET | Freq: Once | ORAL | Status: AC
  Administered 2021-10-16: 1000 mg via ORAL
  Filled 2021-10-16: qty 2

## 2021-10-16 MED ORDER — FUROSEMIDE 40 MG PO TABS
40.0000 mg | ORAL_TABLET | Freq: Two times a day (BID) | ORAL | Status: DC
  Administered 2021-10-16 – 2021-10-17 (×2): 40 mg via ORAL
  Filled 2021-10-16 (×2): qty 1

## 2021-10-16 MED ORDER — DEXAMETHASONE SODIUM PHOSPHATE 10 MG/ML IJ SOLN
INTRAMUSCULAR | Status: AC
  Filled 2021-10-16: qty 1

## 2021-10-16 MED ORDER — DOCUSATE SODIUM 100 MG PO CAPS: 100.0000 mg | ORAL_CAPSULE | Freq: Every day | ORAL | 0 refills | Status: DC | PRN

## 2021-10-16 MED ORDER — ORAL CARE MOUTH RINSE: 15.0000 mL | Freq: Once | OROMUCOSAL | Status: AC

## 2021-10-16 MED ORDER — SODIUM CHLORIDE 0.45 % IV SOLN: INTRAVENOUS | Status: DC

## 2021-10-16 MED ORDER — MECLIZINE HCL 25 MG PO TABS: 25.0000 mg | ORAL_TABLET | Freq: Three times a day (TID) | ORAL | Status: DC | PRN

## 2021-10-16 MED ORDER — LISINOPRIL 10 MG PO TABS
10.0000 mg | ORAL_TABLET | Freq: Every day | ORAL | Status: DC
  Administered 2021-10-17: 10 mg via ORAL
  Filled 2021-10-16: qty 1

## 2021-10-16 MED ORDER — TRAZODONE HCL 50 MG PO TABS
50.0000 mg | ORAL_TABLET | Freq: Every evening | ORAL | Status: DC | PRN
  Administered 2021-10-16: 50 mg via ORAL
  Filled 2021-10-16: qty 1

## 2021-10-16 MED ORDER — ONDANSETRON HCL 4 MG/2ML IJ SOLN
INTRAMUSCULAR | Status: DC | PRN
  Administered 2021-10-16: 4 mg via INTRAVENOUS

## 2021-10-16 MED ORDER — LACTATED RINGERS IV SOLN: INTRAVENOUS | Status: DC

## 2021-10-16 MED ORDER — FENTANYL CITRATE (PF) 100 MCG/2ML IJ SOLN
INTRAMUSCULAR | Status: AC
  Filled 2021-10-16: qty 2

## 2021-10-16 MED ORDER — HYDRALAZINE HCL 50 MG PO TABS
100.0000 mg | ORAL_TABLET | Freq: Once | ORAL | Status: AC
  Administered 2021-10-16: 100 mg via ORAL
  Filled 2021-10-16: qty 2

## 2021-10-16 MED ORDER — METOPROLOL SUCCINATE ER 50 MG PO TB24
50.0000 mg | ORAL_TABLET | Freq: Every day | ORAL | Status: DC
  Administered 2021-10-16 – 2021-10-17 (×2): 50 mg via ORAL
  Filled 2021-10-16 (×2): qty 1

## 2021-10-16 MED ORDER — ATORVASTATIN CALCIUM 40 MG PO TABS
40.0000 mg | ORAL_TABLET | Freq: Every day | ORAL | Status: DC
  Administered 2021-10-17: 40 mg via ORAL
  Filled 2021-10-16: qty 1

## 2021-10-16 MED ORDER — SODIUM CHLORIDE 0.9 % IV SOLN: 250.0000 mL | INTRAVENOUS | Status: DC | PRN

## 2021-10-16 MED ORDER — MIDAZOLAM HCL 5 MG/5ML IJ SOLN
INTRAMUSCULAR | Status: DC | PRN
  Administered 2021-10-16: 2 mg via INTRAVENOUS

## 2021-10-16 SURGICAL SUPPLY — 22 items
BAG DRN RND TRDRP ANRFLXCHMBR (UROLOGICAL SUPPLIES) ×1
BAG URINE DRAIN 2000ML AR STRL (UROLOGICAL SUPPLIES) ×1 IMPLANT
BAG URO CATCHER STRL LF (MISCELLANEOUS) ×1 IMPLANT
CATH SILICONE 22FR 30CC 3WAY (CATHETERS) IMPLANT
DRAPE FOOT SWITCH (DRAPES) ×1 IMPLANT
ELECT REM PT RETURN 15FT ADLT (MISCELLANEOUS) IMPLANT
GLOVE BIOGEL PI IND STRL 7.0 (GLOVE) IMPLANT
GOWN STRL REUS W/ TWL XL LVL3 (GOWN DISPOSABLE) ×1 IMPLANT
GOWN STRL REUS W/TWL XL LVL3 (GOWN DISPOSABLE) ×1
GUIDEWIRE STR DUAL SENSOR (WIRE) IMPLANT
HOLDER FOLEY CATH W/STRAP (MISCELLANEOUS) IMPLANT
IV CATH 14GX2 1/4 (CATHETERS) IMPLANT
KIT TURNOVER KIT A (KITS) IMPLANT
LOOP CUT BIPOLAR 24F LRG (ELECTROSURGICAL) IMPLANT
MANIFOLD NEPTUNE II (INSTRUMENTS) ×1 IMPLANT
PACK CYSTO (CUSTOM PROCEDURE TRAY) ×1 IMPLANT
SYR 30ML LL (SYRINGE) ×1 IMPLANT
SYR TOOMEY IRRIG 70ML (MISCELLANEOUS) ×1
SYRINGE TOOMEY IRRIG 70ML (MISCELLANEOUS) ×1 IMPLANT
TUBING CONNECTING 10 (TUBING) ×1 IMPLANT
TUBING UROLOGY SET (TUBING) ×1 IMPLANT
WATER STERILE IRR 500ML POUR (IV SOLUTION) IMPLANT

## 2021-10-16 NOTE — Op Note (Signed)
Operative Note  Preoperative diagnosis:  1.  BPH with bladder outlet obstruction  Postoperative diagnosis: 1.  BPH with bladder outlet obstruction  Procedure(s): 1.  Bipolar transurethral resection of prostate  Surgeon: Rexene Alberts, MD  Assistants:  None  Anesthesia:  General  Complications:  None  EBL:  54m  Specimens: 1. Prostate chips  Drains/Catheters: 1.  22Fr 3 way catheter with 313mwater into balloon  Intraoperative findings:   Bilobar obstructing prostate with excellent open prostatic fossa at the end of the case with excellent hemostasis.  Indication:  ChRAYNOR CALCATERRAs a [age].o. male with BPH with bladder outlet obstruction presenting for transurethral resection of the prostate. He has a history of severe CHF, CKD stage IV, diabetes [place]welling catheter for approximately 1 year in the year 2022. He was initially evaluated in our office in 01/2021 with complaints of weak flow of stream, straining to void. He was previously placed on finasteride prior to our evaluation. He was started on tamsulosin 0.4 mg daily in January. He developed urinary retention in 05/2021 despite tamsulosin and finasteride. He failed a void trial in 06/2021.  -Interestingly, UDS 08/21/2021 demonstrated max capacity 355. He was able to generate a voluntary contraction and void 330 mL with max flow 13 mL/second with PVR 25 mL. His voiding curve was equivocal. He had an average flow rate of 8.4 mL/second and pressure at peak flow was 48 cm H2O. Foley catheter was replaced at that time. TRUS plan 08/31/2021 2653mrostate. He failed another void trial on 09/14/2021. Cystoscopy 09/14/2021 with mildly obstructing prostate with short prostatic urethral length, no elevation of bladder neck. We discussed options and he elected to proceed with TURP. He understands that based on equivocal flow pattern of UDS, that he may not be catheter free after this procedure.  He understands risks, benefits and alternatives and risk of persistent obstruction or irritative symptoms or leakage.  Description of procedure: The indication, alternatives, benefits and risks were discussed with the patient and informed consent was obtained.  Patient was brought to the operating room table, positioned supine, secured with a safety strap.  Pneumatic compression devices were placed on the lower extremities.  After the administration of intravenous antibiotics and general anesthesia, the patient was repositioned into the dorsal lithotomy position.  All pressure points were carefully padded.  A rectal examination was performed confirming a smooth symmetric enlarged gland.  The genitalia were prepped and draped in standard sterile manner.  A timeout was completed, verifying the correct patient, surgical procedure and positioning prior to beginning the procedure.  Isotonic sodium chloride was used for irrigation.  A 26 French continuous-flow resectoscope sheath with the visual obturator and a 30 degree lens was advanced under direct vision into the bladder.  The anterior urethra appeared normal in its entirety. The prostatic urethra was elongated with bilobar hyperplasia.  On cystoscopic evaluation, his bladder capacity appeared normal, the bladder wall was noted to expand symmetrically in all dimensions.  There were no tumors, stones or foreign bodies present. The bladder was trabeculated with normal-appearing mucosa.  Both ureteral orifices were in their normal anatomic positions with clear urinary reflux noted bilaterally.  The obturator was removed and replaced by the working element with a resection loop.  The location of the ureteral orifices and the prostatic configuration were again confirmed.  Starting at the bladder neck and proceeding distally to the verumontanum a transurethral section of the prostate  was performed using bipolar using energy of 4 and 5 for cutting and coagulation,  respectively.  The procedure began at the bladder neck at the 5 o'clock and 7 o'clock positions and carefully carried distally to the verumontanum, resecting the intervening prostatic adenoma.  Next the left lateral lobe was resected to the level of the transverse capsular fibers.  The identical procedure was performed on the right lobe.  Attention was then directed anteriorly and the resection was completed from the 10 o'clock to 2 o'clock positions.  All bleeding vessels were fulgurated achieving meticulous hemostasis.  The bladder was irrigated with a To[place]y syringe, ensuring removal of all prostate chips which were sent to pathology for evaluation.  Having completed the resection and the chips removed, we again confirmed hemostasis with the loop with coagulating current.  Upon completion of the entire procedure, the bladder and posterior urethra were reexamined, confirming open prostatic urethra and bladder neck without evidence of bleeding or perforation.  Both ureteral orifices and the external sphincter were noted to be intact.  The resectoscope was withdrawn under direct vision and a 22 French three-way Foley catheter with a 30 cc balloon was inserted into the bladder.  The balloon was inflated with 30 cc of sterile water.  After multiple manual irrigations ensuring clear return of the irrigant, the procedure was terminated.  The catheter was attached to a drainage bag and continuous bladder irrigation was started with normal saline.  The patient was positioned supine.  At the end of the procedure, all counts were correct.  Patient tolerated the procedure well and was taken to the recovery room satisfactory condition.  Plan: Continuous bladder irrigation overnight with gentle Foley traction.  Plan to discharge home tomorrow with Foley catheter in place and void trial in the office in 3 days.  [facility]  Pager: [phone]

## 2021-10-16 NOTE — Anesthesia Preprocedure Evaluation (Addendum)
Anesthesia Evaluation  Patient identified by MRN, date of birth, ID band Patient awake    Reviewed: Allergy & Precautions, NPO status , Patient's Chart, lab work & pertinent test results  History of Anesthesia Complications Negative for: history of anesthetic complications  Airway Mallampati: II  TM Distance: >3 FB Neck ROM: Full    Dental  (+) Dental Advisory Given, Chipped   Pulmonary neg pulmonary ROS,    Pulmonary exam normal        Cardiovascular hypertension, Pt. on home beta blockers and Pt. on medications + CAD  Normal cardiovascular exam   '23 TTE - EF 60 to 65%. There is moderate concentric left ventricular hypertrophy. Grade II diastolic dysfunction (pseudonormalization). Trivial MR.    Neuro/Psych PSYCHIATRIC DISORDERS Anxiety Depression CVA, No Residual Symptoms    GI/Hepatic Neg liver ROS, PUD, GERD  Medicated and Controlled,  Endo/Other  diabetes, Type 2, Insulin Dependent K 3.1   Renal/GU CRFRenal disease     Musculoskeletal  (+) Arthritis ,   Abdominal   Peds  Hematology  (+) Blood dyscrasia, anemia ,  On Plavix    Anesthesia Other Findings Left eye visual loss   Reproductive/Obstetrics                            Anesthesia Physical Anesthesia Plan  ASA: 3  Anesthesia Plan: General   Post-op Pain Management: Tylenol PO (pre-op)*   Induction: Intravenous  PONV Risk Score and Plan: 2 and Treatment may vary due to age or medical condition and Ondansetron  Airway Management Planned: LMA  Additional Equipment: None  Intra-op Plan:   Post-operative Plan: Extubation in OR  Informed Consent: I have reviewed the patients History and Physical, chart, labs and discussed the procedure including the risks, benefits and alternatives for the proposed anesthesia with the patient or authorized representative who has indicated his/her understanding and acceptance.      Dental advisory given  Plan Discussed with: CRNA and Anesthesiologist  Anesthesia Plan Comments:        Anesthesia Quick Evaluation

## 2021-10-16 NOTE — Progress Notes (Signed)
Pt's BP upon arrival 200/101-->219/111--->204/105--->190/84. Pulse 94. Pt did not take any medications today. Dr. Ola Spurr made aware, orders to give home doses of amlodipine and hydralazine given.

## 2021-10-16 NOTE — H&P (Signed)
Office Visit Report     09/14/2021   --------------------------------------------------------------------------------   Ryan Jenkins  MRN: [id number]  DOB: [birth year], 61 year old Male  SSN:    PRIMARY CARE:  Armanda Heritage, NP  REFERRING:    PROVIDER:  Olene Craven. Cain Sieve, M.D.  TREATING:  Rexene Alberts, M.D.  LOCATION:  [facility] [phone]     --------------------------------------------------------------------------------   CC/HPI: Ryan Jenkins is a 61 year old male is seen in consultation today for BPH with urinary retention.   1. 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  -He was initially evaluated in our office in 01/2021 with complaints of weak flow of stream, straining to void. He was previously placed on finasteride prior to our evaluation. He was started on tamsulosin 0.4 mg daily in January.  -He developed urinary retention in 05/2021 despite tamsulosin and finasteride.  -He failed a void trial in 06/2021.  -Interestingly, UDS 08/21/2021 demonstrated max capacity 355. He was able to generate a voluntary contraction and void 330 mL with max flow 13 mL/second with PVR 25 mL. His voiding curve was equivocal. He had an average flow rate of 8.4 mL/second and pressure at peak flow was 48 cm H2O. Foley catheter was replaced at that time.  -TRUS plan 08/31/2021 33m prostate  -He failed another void trial on 09/14/2021.  -Cystoscopy 09/14/2021 with mildly obstructing prostate with short prostatic urethral length, no elevation of bladder neck.   #2. Prostate cancer screening: PSA on 01/20/2021 was 1.57. He denies a family history of prostate cancer.   He has a past medical history of diabetes, hypertension, CKD stage IV, chronic diastolic congestive heart failure, CAD, hyperlipidemia, diabetic  polyneuropathy, poor ambulatory status currently using a walker at baseline.  Of note, he has history of severe respiratory distress requiring intubation/ventilation and ICU stay. He was found to have ARDS and worsening diastolic heart failure.     ALLERGIES: hydrochlorithiazide Latex loratadine Metformin pregabalin    MEDICATIONS: Finasteride 5 mg tablet 1 tablet PO Daily  Finasteride  Lisinopril  Tamsulosin Hcl  Amlodipine Besilate  Atorvastatin Calcium  Carvedilol  Hydralazine Hcl  Insulin Syringe  Nortriptyline Hcl 25 mg capsule  Ondansetron Hcl 4 mg tablet  Trazodone Hcl     GU PSH: Complex cystometrogram, w/ void pressure and urethral pressure profile studies, any technique - 08/21/2021 Complex Uroflow - 08/21/2021 Emg surf Electrd - 08/21/2021 Inject For cystogram - 08/21/2021 Intrabd voidng Press - 08/21/2021     NON-GU PSH: No Non-GU PSH    GU PMH: Balanitis - 08/31/2021, - 07/31/2021 Urinary Retention - 08/31/2021, - 08/21/2021, - 07/31/2021, - 07/03/2021, - 06/01/2021 ED, disease classified elsewhere - 06/01/2021, - 01/20/2021 BPH w/LUTS - 01/20/2021 Paraphimosis - 01/20/2021 Chronic kidney disease stage 4 (GFR 15-30)      PMH Notes: Vertigo   NON-GU PMH: Hypertensive crisis, unspecified - 07/03/2021 Diabetes Type 2 Gastroparesis Heart failure, unspecified Type 2 diabetes mellitus with other specified complication    FAMILY HISTORY: No Family History    SOCIAL HISTORY: Marital Status: Divorced    REVIEW OF SYSTEMS:    GU Review Male:   Patient denies frequent urination, hard to postpone urination, burning/ pain with urination, get up at night to urinate, leakage of urine, stream starts and stops, trouble starting your stream, have to strain to  urinate , erection problems, and penile pain.  Gastrointestinal (Upper):   Patient denies nausea, vomiting, and indigestion/ heartburn.  Gastrointestinal (Lower):   Patient denies diarrhea and constipation.  Constitutional:    Patient denies fever, night sweats, weight loss, and fatigue.  Skin:   Patient denies skin rash/ lesion and itching.  Eyes:   Patient denies blurred vision and double vision.  Ears/ Nose/ Throat:   Patient denies sore throat and sinus problems.  Hematologic/Lymphatic:   Patient denies swollen glands and easy bruising.  Cardiovascular:   Patient denies leg swelling and chest pains.  Respiratory:   Patient denies cough and shortness of breath.  Endocrine:   Patient denies excessive thirst.  Musculoskeletal:   Patient denies back pain and joint pain.  Neurological:   Patient denies headaches and dizziness.  Psychologic:   Patient denies depression and anxiety.   VITAL SIGNS: None   GU PHYSICAL EXAMINATION:    Anus and Perineum: No hemorrhoids. No anal stenosis. No rectal fissure, no anal fissure. No edema, no dimple, no perineal tenderness, no anal tenderness.  Prostate: 25 cc, no nodules  Seminal Vesicles: Nonpalpable.  Sphincter Tone: Normal sphincter. No rectal tenderness. No rectal mass.    MULTI-SYSTEM PHYSICAL EXAMINATION:    Constitutional: Well-nourished. No physical deformities. Normally developed. Good grooming.  Respiratory: No labored breathing, no use of accessory muscles.   Cardiovascular: Normal temperature, normal extremity pulses, no swelling, no varicosities.  Gastrointestinal: No mass, no tenderness, no rigidity, non obese abdomen.     Complexity of Data:  Source Of History:  Patient, Medical Record Summary  Lab Test Review:   PSA  Records Review:   Previous Doctor Records, Previous Hospital Records, Previous Patient Records  Urine Test Review:   Urinalysis  Urodynamics Review:   Review Urodynamics Tests   01/20/21  PSA  Total PSA 1.57 ng/mL    PROCEDURES:         Flexible Cystoscopy - 52000  Risks, benefits, and some of the potential complications of the procedure were discussed at length with the patient including infection, bleeding, voiding discomfort,  urinary retention, fever, chills, sepsis, and others. All questions were answered. Informed consent was obtained. Antibiotic prophylaxis was given. Sterile technique and intraurethral analgesia were used.  Meatus:  Normal size. Normal location. Normal condition.  Urethra:  No strictures.  External Sphincter:  Normal.  Verumontanum:  Normal.  Prostate:  Obstructing. Moderate hyperplasia.  Bladder Neck:  Non-obstructing.  Ureteral Orifices:  Normal location. Normal size. Normal shape. Effluxed clear urine.  Bladder:  No trabeculation. No tumors. Evidence of inflammation consistent with indwelling Foley catheter irritation. No stones.      The lower urinary tract was carefully examined. The procedure was well-tolerated and without complications. Antibiotic instructions were given. Instructions were given to call the office immediately for bloody urine, difficulty urinating, urinary retention, painful or frequent urination, fever, chills, nausea, vomiting or other illness. The patient stated that he understood these instructions and would comply with them.        Simple Foley Catheterization - [id number]  Pt was prepped and cleaned. A 16 French Foley SILICONE catheter was inserted into the bladder using sterile technique. A leg bag was connected. Pt tolerated procedure well.    ASSESSMENT:      ICD-10 Details  1 GU:   BPH w/LUTS - N40.1   2   Urinary Retention - R33.8   3   Encounter for Prostate Cancer screening - Z12.5    PLAN:  Document Letter(s):  Created for Patient: Clinical Summary         Notes:   1. 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 UDS with equivocal flow at that time was able to void with low PVR. Cystoscopy today with mildly obstructing prostate. Failed void trial again today.  -We discussed his severe comorbidities including cardiac, pulmonary, renal and diabetic. He also has limited mobility using a walker.  -We  discussed the possibility of diabetes contributing to an areflexive bladder.  -As he has equivocal UDS, I did offer bladder outlet obstruction procedure. We discussed that even after this procedure, he may not be catheter free however this would be his best chance to be catheter free. We discussed options including TURP, UroLift, ablation techniques. He like to proceed with TURP. He will require cardiac clearance prior. Surgery letter sent. I did discuss risk of recurrent obstruction, persistent irritative voiding symptoms and risk of incontinence.   Risks and benefits of Transurethral Resection of the Prostate were reviewed in detail including infection, bleeding, blood transfusion, injury to bladder/urethra/surrounding structures, erectile dysfunction, urinary incontinence, bladder neck contracture, persistent obstructive and irritative voiding symptoms, and global anesthesia risks including but not limited to CVA, MI, DVT, PE, pneumonia, and death. He expressed understanding and desire to proceed.   #2. Prostate cancer screening: PSA on 01/20/2021 was 1.57. He denies a family history of prostate cancer.   CC: Armanda Heritage, NP  CC: Donald Pore, MD   Urology Preoperative H&P   Chief Complaint: BPH  History of Present Illness: Ryan Jenkins is a 61 y.o. male with BPH vs areflexic bladder here for TURP. He had an equivocal urodynamic flow. Options were discussed and TURP was presented as best option to be catheter free however there is always a possibility that he would still require a catheter. He is currently tolerating his catheter well.    Past Medical History:  Diagnosis Date   Anemia    Anginal pain ([place])    Anxiety    Arthritis    BPH with obstruction/lower urinary tract symptoms    CAD (coronary artery disease)    cardiologist--- dr Junius Roads badal;  11-06-2019 cardiac cath in [place] (result in care everywhere)  nonobstructive cad involing pRCA 60% (done in setting worseing CHF/  acute pulmonary edema requiring intubation/ AKI   Chronic combined systolic and diastolic CHF (congestive heart failure) ([place])    Chronic kidney disease, stage IV (severe) ([place])    nephrologist--- dr Carolin Sicks   Depression    Diabetic neuropathy ([place])    Dyspnea    Edema of both lower extremities    GERD (gastroesophageal reflux disease)    Heart murmur    History of community acquired pneumonia    ad[facility]-2021 [facility]  w/ ARF hypoxia w/ severe sepsis   Hyperlipidemia    Hypertension    Insulin dependent type 2 diabetes mellitus ([place])    Pneumonia    Retinopathy due to secondary diabetes ([place])    Uses walker    Vitamin B12 deficiency    Vitreous hemorrhage [facility])     Past Surgical History:  Procedure Laterality Date   CARDIAC CATHETERIZATION  11/06/2019   [place] in [place];    nonobstructive CAD , pRCA 60% (result in care everywhere)   CATARACT EXTRACTION W/ INTRAOCULAR LENS IMPLANT Bilateral 2017    Allergies:  Allergies  Allergen Reactions   Claritin [Loratadine] Swelling  Joint swelling    Hydrochlorothiazide Other (See Comments)    Dizziness   Latex Hives   Lyrica [Pregabalin] Other (See Comments)    depression   Metformin And Related Other (See Comments)    GI    Family History  Problem Relation Age of Onset   Renal cancer Mother    Hypertension Mother    Pancreatic cancer Mother    Hypertension Sister    Stroke Sister    Leukemia Maternal Uncle    Sickle cell trait Maternal Aunt    Colon cancer Neg Hx    Esophageal cancer Neg Hx    Rectal cancer Neg Hx    Stomach cancer Neg Hx     Social History:  reports that he has never smoked. He has never used smokeless tobacco. He reports that he does not drink alcohol and does not use drugs.  ROS: A complete review of systems was performed.  All systems are negative except for pertinent findings as noted.  Physical Exam:  Vital signs in last 24 hours:   Constitutional:  Alert  and oriented, No acute distress Cardiovascular: Regular rate and rhythm Respiratory: Normal respiratory effort, Lungs clear bilaterally GI: Abdomen is soft, nontender, nondistended, no abdominal masses GU: No CVA tenderness Lymphatic: No lymphadenopathy Neurologic: Grossly intact, no focal deficits Psychiatric: Normal mood and affect  Laboratory Data:  No results for input(s): "WBC", "HGB", "HCT", "PLT" in the last 72 hours.  No results for input(s): "NA", "K", "CL", "GLUCOSE", "BUN", "CALCIUM", "CREATININE" in the last 72 hours.  Invalid input(s): "CO3"   No results found for this or any previous visit (from the past 24 hour(s)). No results found for this or any previous visit (from the past 240 hour(s)).  Renal Function: Recent Labs    10/10/21 1154  CREATININE 3.08*   Estimated Creatinine Clearance: 29.3 mL/min (A) (by C-G formula based on SCr of 3.08 mg/dL (H)).  Radiologic Imaging: No results found.  I independently reviewed the above imaging studies.  Assessment and Plan Ryan Jenkins is a 61 y.o. male with BPH vs areflexic bladder here for TURP.   Risks and benefits of Transurethral Resection of the Prostate were reviewed in detail including infection, bleeding, blood transfusion, injury to bladder/urethra/surrounding structures, erectile dysfunction, retrograde ejaculation, urinary incontinence, bladder neck contracture, persistent obstructive and irritative voiding symptoms, and global anesthesia risks including but not limited to CVA, MI, DVT, PE, pneumonia, and death. He expressed understanding and desire to proceed.   Matt R. Ryan Mcqueary MD 10/16/2021, 10:26 AM  [facility] Pager: [phone]): [phone]

## 2021-10-16 NOTE — Discharge Instructions (Signed)
Activity:  You are encouraged to ambulate frequently (about every hour during waking hours) to help prevent blood clots from forming in your legs or lungs.   ? ?Diet: You should advance your diet as instructed by your physician.  It will be normal to have some bloating, nausea, and abdominal discomfort intermittently. ? ?Prescriptions:  You will be provided a prescription for pain medication to take as needed.  If your pain is not severe enough to require the prescription pain medication, you may take extra strength Tylenol instead which will have less side effects.  You should also take a prescribed stool softener to avoid straining with bowel movements as the prescription pain medication may constipate you. ? ?What to call us about: You should call the office ([phone]) if you develop fever > 101 or develop persistent vomiting. Activity:  You are encouraged to ambulate frequently (about every hour during waking hours) to help prevent blood clots from forming in your legs or lungs.   ? ?You have a foley catheter in place. Return to clinic in 3 days for foley removal. ? ?

## 2021-10-16 NOTE — Anesthesia Procedure Notes (Signed)
Procedure Name: LMA Insertion Date/Time: 10/16/2021 2:47 PM  Performed by: Montel Clock, CRNAPre-anesthesia Checklist: Patient identified, Emergency Drugs available, Suction available, Patient being monitored and Timeout performed Patient Re-evaluated:Patient Re-evaluated prior to induction Oxygen Delivery Method: Circle system utilized Preoxygenation: Pre-oxygenation with 100% oxygen Induction Type: IV induction Ventilation: Mask ventilation without difficulty LMA: LMA with gastric port inserted LMA Size: 5.0 Number of attempts: 1 Dental Injury: Teeth and Oropharynx as per pre-operative assessment

## 2021-10-16 NOTE — Transfer of Care (Signed)
Immediate Anesthesia Transfer of Care Note  Patient: Ryan Jenkins  Procedure(s) Performed: TRANSURETHRAL RESECTION OF THE PROSTATE (TURP)  Patient Location: PACU  Anesthesia Type:General  Level of Consciousness: drowsy and patient cooperative  Airway & Oxygen Therapy: Patient Spontanous Breathing and Patient connected to face mask oxygen  Post-op Assessment: Report given to RN and Post -op Vital signs reviewed and stable  Post vital signs: Reviewed and stable  Last Vitals:  Vitals Value Taken Time  BP 141/73 10/16/21 1553  Temp    Pulse 80 10/16/21 1557  Resp 12 10/16/21 1557  SpO2 99 % 10/16/21 1557  Vitals shown include unvalidated device data.  Last Pain:  Vitals:   10/16/21 1136  TempSrc: Oral  PainSc:       Patients Stated Pain Goal: 3 (95/32/02 3343)  Complications: No notable events documented.

## 2021-10-17 ENCOUNTER — Encounter (HOSPITAL_COMMUNITY): Payer: Self-pay | Admitting: Urology

## 2021-10-17 DIAGNOSIS — C61 Malignant neoplasm of prostate: Secondary | ICD-10-CM | POA: Diagnosis not present

## 2021-10-17 LAB — CBC
HCT: 27.9 % — ABNORMAL LOW (ref 39.0–52.0)
Hemoglobin: 9 g/dL — ABNORMAL LOW (ref 13.0–17.0)
MCH: 29.6 pg (ref 26.0–34.0)
MCHC: 32.3 g/dL (ref 30.0–36.0)
MCV: 91.8 fL (ref 80.0–100.0)
Platelets: 335 10*3/uL (ref 150–400)
RBC: 3.04 MIL/uL — ABNORMAL LOW (ref 4.22–5.81)
RDW: 14.7 % (ref 11.5–15.5)
WBC: 7.5 10*3/uL (ref 4.0–10.5)
nRBC: 0 % (ref 0.0–0.2)

## 2021-10-17 LAB — BASIC METABOLIC PANEL
Anion gap: 9 (ref 5–15)
BUN: 42 mg/dL — ABNORMAL HIGH (ref 8–23)
CO2: 22 mmol/L (ref 22–32)
Calcium: 8.1 mg/dL — ABNORMAL LOW (ref 8.9–10.3)
Chloride: 107 mmol/L (ref 98–111)
Creatinine, Ser: 3.49 mg/dL — ABNORMAL HIGH (ref 0.61–1.24)
GFR, Estimated: 19 mL/min — ABNORMAL LOW (ref >60)
Glucose, Bld: 162 mg/dL — ABNORMAL HIGH (ref 70–99)
Potassium: 3.4 mmol/L — ABNORMAL LOW (ref 3.5–5.1)
Sodium: 138 mmol/L (ref 135–145)

## 2021-10-17 LAB — GLUCOSE, CAPILLARY
Glucose-Capillary: 173 mg/dL — ABNORMAL HIGH (ref 70–99)
Glucose-Capillary: 263 mg/dL — ABNORMAL HIGH (ref 70–99)
Glucose-Capillary: 130 mg/dL — ABNORMAL HIGH (ref 70–99)
Glucose-Capillary: 154 mg/dL — ABNORMAL HIGH (ref 70–99)

## 2021-10-17 LAB — SURGICAL PATHOLOGY

## 2021-10-17 MED ORDER — PHENOL 1.4 % MT LIQD
1.0000 | OROMUCOSAL | Status: DC | PRN
  Administered 2021-10-17: 1 via OROMUCOSAL
  Filled 2021-10-17: qty 177

## 2021-10-17 NOTE — Progress Notes (Signed)
MD in the room to assess and irrigate foley catheter. Patient tolerated procedure well.

## 2021-10-17 NOTE — Progress Notes (Signed)
Patient to be discharged to home this afternoon. Patient given discharge teaching/instructions including all discharge Medications and schedules for these Medications. Patient's foley catheter flowing with light pink urine. Patient given home foley/leg bag teaching. Understanding verbalized by the Patient. Discharge AVS with the Patient at time of discharge

## 2021-10-17 NOTE — Anesthesia Postprocedure Evaluation (Signed)
Anesthesia Post Note  Patient: Ryan Jenkins  Procedure(s) Performed: TRANSURETHRAL RESECTION OF THE PROSTATE (TURP)     Patient location during evaluation: PACU Anesthesia Type: General Level of consciousness: awake and alert Pain management: pain level controlled Vital Signs Assessment: post-procedure vital signs reviewed and stable Respiratory status: spontaneous breathing, nonlabored ventilation, respiratory function stable and patient connected to nasal cannula oxygen Cardiovascular status: blood pressure returned to baseline and stable Postop Assessment: no apparent nausea or vomiting Anesthetic complications: no   No notable events documented.  Last Vitals:  Vitals:   10/17/21 0441 10/17/21 0843  BP: (!) 153/81 (!) 146/76  Pulse: 80 75  Resp: 20 18  Temp: 37 C 37 C  SpO2: 97% 95%    Last Pain:  Vitals:   10/17/21 1034  TempSrc:   PainSc: 3    Pain Goal: Patients Stated Pain Goal: 2 (10/17/21 0949)                 Tiajuana Amass

## 2021-10-17 NOTE — Progress Notes (Signed)
Mobility Specialist - Progress Note   10/17/21 1000  Mobility  Activity Ambulated with assistance in hallway  Activity Response Tolerated well  Distance Ambulated (ft) 500 ft  $Mobility charge 1 Mobility  Level of Assistance Modified independent, requires aide device or extra time  Assistive Device Front wheel walker  HOB Elevated/Bed Position Self regulated  Range of Motion/Exercises Active   Pt was found in bed and agreeable to ambulate. Stated feeling a little light headed during ambulation and at EOS returned to bed with all necessities in reach.  Ferd Hibbs Mobility Specialist

## 2021-10-17 NOTE — Discharge Summary (Signed)
Date of admission: 10/16/2021  Date of discharge: 10/17/2021  Admission diagnosis: BPH  Discharge diagnosis: BPH  Secondary diagnoses: None  History and Physical: For full details, please see admission history and physical. Briefly, Ryan Jenkins is a 61 y.o. year old patient with BPH here for TURP.   Hospital Course: The patient recovered in the usual expected fashion.  He had his diet advanced slowly.  Initially managed with IV pain control, then transitioned to PO meds when he was tolerating oral intake.  His labs were stable throughout the hospital course.  He was discharged to home on POD#1.  At the time of discharge the patient had clear yellow urine with inflow capped. He was tolerating a regular diet, passing flatus, ambulating, had adequate pain control and was agreeable to discharge.  Follow up as scheduled.    Laboratory values:  Recent Labs    10/16/21 1622 10/17/21 0445  HGB 9.1* 9.0*  HCT 28.0* 27.9*   Recent Labs    10/16/21 1622 10/17/21 0445  CREATININE 3.26* 3.49*    Disposition: Home  Discharge instruction: The patient was instructed to be ambulatory but told to refrain from heavy lifting, strenuous activity, or driving.  Discharge medications:  Allergies as of 10/17/2021       Reactions   Claritin [loratadine] Swelling   Joint swelling   Hydrochlorothiazide Other (See Comments)   Dizziness   Latex Hives   Lyrica [pregabalin] Other (See Comments)   depression   Metformin And Related Other (See Comments)   GI        Medication List     TAKE these medications    acetaminophen 500 MG tablet Commonly known as: TYLENOL Take 1,000 mg by mouth every 6 (six) hours as needed for mild pain or headache.   amLODipine 10 MG tablet Commonly known as: NORVASC Take 1 tablet (10 mg total) by mouth daily.   atorvastatin 40 MG tablet Commonly known as: LIPITOR Take 1 tablet (40 mg total) by mouth daily.   Blood Pressure Monitor Devi Use as directed  to check home blood pressure 2-3 times a week   cephALEXin 500 MG capsule Commonly known as: KEFLEX Take 1 capsule (500 mg total) by mouth 2 (two) times daily for 5 days.   ciprofloxacin 500 MG tablet Commonly known as: CIPRO Take 500 mg by mouth daily.   clopidogrel 75 MG tablet Commonly known as: PLAVIX Take 1 tablet (75 mg total) by mouth daily.   clotrimazole 1 % cream Commonly known as: LOTRIMIN Apply 1 Application topically 2 (two) times daily as needed (rash).   D-Mannose 350 MG Caps Take 1,050 capsules by mouth in the morning and at bedtime.   docusate sodium 100 MG capsule Commonly known as: Colace Take 1 capsule (100 mg total) by mouth daily as needed for up to 30 doses.   escitalopram 20 MG tablet Commonly known as: LEXAPRO Take 1 tablet (20 mg total) by mouth daily.   famotidine 40 MG tablet Commonly known as: PEPCID Take 40 mg by mouth daily.   finasteride 5 MG tablet Commonly known as: PROSCAR Take 1 tablet (5 mg total) by mouth daily.   furosemide 40 MG tablet Commonly known as: Lasix Take 1 tablet (40 mg total) by mouth 2 (two) times daily.   gabapentin 300 MG capsule Commonly known as: NEURONTIN Take 300 mg by mouth 3 (three) times daily.   hydrALAZINE 100 MG tablet Commonly known as: APRESOLINE Take 1 tablet (100 mg total)  by mouth 3 (three) times daily.   Lantus SoloStar 100 UNIT/ML Solostar Pen Generic drug: insulin glargine Inject 25 Units into the skin at bedtime. What changed: how much to take   lisinopril 10 MG tablet Commonly known as: ZESTRIL Take 10 mg by mouth daily.   meclizine 25 MG tablet Commonly known as: ANTIVERT Take 25 mg by mouth 3 (three) times daily as needed for dizziness.   metoCLOPramide 10 MG tablet Commonly known as: REGLAN Take 0.5 tablets (5 mg total) by mouth every 8 (eight) hours as needed for nausea or vomiting (Or headache).   metoprolol succinate 50 MG 24 hr tablet Commonly known as: TOPROL-XL Take  1 tablet (50 mg total) by mouth at bedtime. Take with or immediately following a meal.   nortriptyline 25 MG capsule Commonly known as: PAMELOR Take 1 capsule (25 mg total) by mouth at bedtime.   oxyCODONE-acetaminophen 5-325 MG tablet Commonly known as: Percocet Take 1 tablet by mouth every 4 (four) hours as needed for up to 18 doses for severe pain.   pantoprazole 40 MG tablet Commonly known as: PROTONIX Take 1 tablet (40 mg total) by mouth daily.   potassium chloride 10 MEQ tablet Commonly known as: KLOR-CON M Take 1 tablet (10 mEq total) by mouth daily.   sodium bicarbonate 650 MG tablet Take 650 mg by mouth 2 (two) times daily.   tamsulosin 0.4 MG Caps capsule Commonly known as: FLOMAX Take 0.4 mg by mouth at bedtime.   traZODone 50 MG tablet Commonly known as: DESYREL Take 3 tablets (150 mg total) by mouth at bedtime. What changed:  how much to take when to take this reasons to take this   triamcinolone ointment 0.5 % Commonly known as: KENALOG Apply 1 Application topically 2 (two) times daily as needed (rash).   True Metrix Blood Glucose Test test strip Generic drug: glucose blood Use as instructed   True Metrix Meter w/Device Kit Use as directed   TRUEplus Insulin Syringe 31G X 5/16" 0.3 ML Misc Generic drug: Insulin Syringe-Needle U-100 Use to inject Levemir at bedtime.   TRUEplus Lancets 28G Misc Use as directed   Vitamin D3 1.25 MG (50000 UT) Caps Take 50,000 Units by mouth once a week.        Followup:   Follow-up Information     [facility] Follow up on 10/19/2021.   Why: 10:45 Contact information: [place]. [facility]  Pager: [phone]

## 2021-10-17 NOTE — Progress Notes (Signed)
Evaluated pt at bedside. Able to irrigate catheter without difficult. Irrigant is clear with only slight pink tinge. Irrigates without resistance. Bedside bladder scan performed by myself with 76m in bladder. Balloon has 367min place. Advise ensuring catheter is draining freely and ok to discharge with followup on Thursday for void trial.  Matt R. [facility]  Pager: [phone]

## 2021-10-17 NOTE — Progress Notes (Signed)
Patient's Foley noted to have turned back to red in color once CBI was stopped earlier today per MD orders. MD updated via phone. Order to irrigate foley. Two RNs attempted to irrigate foley however unable to pull back once the irrigant was pushed in. Bladder scan done with results of 20 noted. Patient is reporting feeling fullness. Will update MD.

## 2021-10-17 NOTE — Progress Notes (Signed)
  Transition of Care [facility]) Screening Note   Patient Details  Name: Ryan Jenkins Date of Birth: 1960-12-21   Transition of Care [facility]) CM/SW Contact:    Dessa Phi, RN Phone Number: 10/17/2021, 9:51 AM    Transition of Care Department [facility]) has reviewed patient and no TOC needs have been identified at this time. We will continue to monitor patient advancement through interdisciplinary progression rounds. If new patient transition needs arise, please place a TOC consult.

## 2021-10-18 ENCOUNTER — Other Ambulatory Visit: Payer: Self-pay

## 2021-10-18 ENCOUNTER — Encounter (HOSPITAL_COMMUNITY): Payer: Self-pay | Admitting: Family Medicine

## 2021-10-18 ENCOUNTER — Inpatient Hospital Stay (HOSPITAL_COMMUNITY)
Admission: EM | Admit: 2021-10-18 | Discharge: 2021-10-22 | DRG: 291 | Disposition: A | Discharge status: Home / Self Care | Payer: Medicaid Other | Attending: Internal Medicine | Admitting: Internal Medicine

## 2021-10-18 ENCOUNTER — Emergency Department (HOSPITAL_COMMUNITY): Payer: Medicaid Other

## 2021-10-18 DIAGNOSIS — R3914 Feeling of incomplete bladder emptying: Secondary | ICD-10-CM

## 2021-10-18 DIAGNOSIS — I5033 Acute on chronic diastolic (congestive) heart failure: Secondary | ICD-10-CM | POA: Diagnosis present

## 2021-10-18 DIAGNOSIS — Z9079 Acquired absence of other genital organ(s): Secondary | ICD-10-CM

## 2021-10-18 DIAGNOSIS — F32A Depression, unspecified: Secondary | ICD-10-CM | POA: Diagnosis present

## 2021-10-18 DIAGNOSIS — N401 Enlarged prostate with lower urinary tract symptoms: Secondary | ICD-10-CM

## 2021-10-18 DIAGNOSIS — N179 Acute kidney failure, unspecified: Secondary | ICD-10-CM | POA: Diagnosis present

## 2021-10-18 DIAGNOSIS — Z888 Allergy status to other drugs, medicaments and biological substances status: Secondary | ICD-10-CM

## 2021-10-18 DIAGNOSIS — Z20822 Contact with and (suspected) exposure to covid-19: Secondary | ICD-10-CM | POA: Diagnosis present

## 2021-10-18 DIAGNOSIS — E1122 Type 2 diabetes mellitus with diabetic chronic kidney disease: Secondary | ICD-10-CM | POA: Diagnosis present

## 2021-10-18 DIAGNOSIS — F39 Unspecified mood [affective] disorder: Secondary | ICD-10-CM | POA: Diagnosis present

## 2021-10-18 DIAGNOSIS — N3289 Other specified disorders of bladder: Secondary | ICD-10-CM | POA: Diagnosis present

## 2021-10-18 DIAGNOSIS — I130 Hypertensive heart and chronic kidney disease with heart failure and stage 1 through stage 4 chronic kidney disease, or unspecified chronic kidney disease: Principal | ICD-10-CM | POA: Diagnosis present

## 2021-10-18 DIAGNOSIS — R011 Cardiac murmur, unspecified: Secondary | ICD-10-CM | POA: Diagnosis present

## 2021-10-18 DIAGNOSIS — N184 Chronic kidney disease, stage 4 (severe): Secondary | ICD-10-CM | POA: Diagnosis not present

## 2021-10-18 DIAGNOSIS — Z8701 Personal history of pneumonia (recurrent): Secondary | ICD-10-CM

## 2021-10-18 DIAGNOSIS — Z961 Presence of intraocular lens: Secondary | ICD-10-CM | POA: Diagnosis present

## 2021-10-18 DIAGNOSIS — I5043 Acute on chronic combined systolic (congestive) and diastolic (congestive) heart failure: Secondary | ICD-10-CM | POA: Diagnosis present

## 2021-10-18 DIAGNOSIS — Z8619 Personal history of other infectious and parasitic diseases: Secondary | ICD-10-CM

## 2021-10-18 DIAGNOSIS — N400 Enlarged prostate without lower urinary tract symptoms: Secondary | ICD-10-CM | POA: Diagnosis present

## 2021-10-18 DIAGNOSIS — E119 Type 2 diabetes mellitus without complications: Secondary | ICD-10-CM | POA: Diagnosis present

## 2021-10-18 DIAGNOSIS — Z9841 Cataract extraction status, right eye: Secondary | ICD-10-CM

## 2021-10-18 DIAGNOSIS — E11319 Type 2 diabetes mellitus with unspecified diabetic retinopathy without macular edema: Secondary | ICD-10-CM | POA: Diagnosis present

## 2021-10-18 DIAGNOSIS — E538 Deficiency of other specified B group vitamins: Secondary | ICD-10-CM | POA: Diagnosis present

## 2021-10-18 DIAGNOSIS — E1142 Type 2 diabetes mellitus with diabetic polyneuropathy: Secondary | ICD-10-CM | POA: Diagnosis present

## 2021-10-18 DIAGNOSIS — Z91040 Latex allergy status: Secondary | ICD-10-CM

## 2021-10-18 DIAGNOSIS — E876 Hypokalemia: Secondary | ICD-10-CM | POA: Diagnosis present

## 2021-10-18 DIAGNOSIS — Z9842 Cataract extraction status, left eye: Secondary | ICD-10-CM

## 2021-10-18 DIAGNOSIS — Z832 Family history of diseases of the blood and blood-forming organs and certain disorders involving the immune mechanism: Secondary | ICD-10-CM

## 2021-10-18 DIAGNOSIS — D631 Anemia in chronic kidney disease: Secondary | ICD-10-CM | POA: Diagnosis present

## 2021-10-18 DIAGNOSIS — I2510 Atherosclerotic heart disease of native coronary artery without angina pectoris: Secondary | ICD-10-CM | POA: Diagnosis present

## 2021-10-18 DIAGNOSIS — K219 Gastro-esophageal reflux disease without esophagitis: Secondary | ICD-10-CM | POA: Diagnosis present

## 2021-10-18 DIAGNOSIS — E785 Hyperlipidemia, unspecified: Secondary | ICD-10-CM | POA: Diagnosis present

## 2021-10-18 DIAGNOSIS — R310 Gross hematuria: Secondary | ICD-10-CM | POA: Diagnosis present

## 2021-10-18 DIAGNOSIS — M1990 Unspecified osteoarthritis, unspecified site: Secondary | ICD-10-CM | POA: Diagnosis present

## 2021-10-18 DIAGNOSIS — I10 Essential (primary) hypertension: Secondary | ICD-10-CM | POA: Diagnosis present

## 2021-10-18 DIAGNOSIS — Z23 Encounter for immunization: Secondary | ICD-10-CM

## 2021-10-18 DIAGNOSIS — Z7902 Long term (current) use of antithrombotics/antiplatelets: Secondary | ICD-10-CM

## 2021-10-18 DIAGNOSIS — E11649 Type 2 diabetes mellitus with hypoglycemia without coma: Secondary | ICD-10-CM | POA: Diagnosis not present

## 2021-10-18 DIAGNOSIS — Z794 Long term (current) use of insulin: Secondary | ICD-10-CM

## 2021-10-18 DIAGNOSIS — Z79899 Other long term (current) drug therapy: Secondary | ICD-10-CM

## 2021-10-18 DIAGNOSIS — I2583 Coronary atherosclerosis due to lipid rich plaque: Secondary | ICD-10-CM

## 2021-10-18 DIAGNOSIS — F419 Anxiety disorder, unspecified: Secondary | ICD-10-CM | POA: Diagnosis present

## 2021-10-18 DIAGNOSIS — Z8249 Family history of ischemic heart disease and other diseases of the circulatory system: Secondary | ICD-10-CM

## 2021-10-18 DIAGNOSIS — I509 Heart failure, unspecified: Secondary | ICD-10-CM

## 2021-10-18 LAB — CBC WITH DIFFERENTIAL/PLATELET
Abs Immature Granulocytes: 0.03 10*3/uL (ref 0.00–0.07)
Basophils Absolute: 0.1 10*3/uL (ref 0.0–0.1)
Basophils Relative: 1 %
Eosinophils Absolute: 0.3 10*3/uL (ref 0.0–0.5)
Eosinophils Relative: 3 %
HCT: 28.6 % — ABNORMAL LOW (ref 39.0–52.0)
Hemoglobin: 9.1 g/dL — ABNORMAL LOW (ref 13.0–17.0)
Immature Granulocytes: 0 %
Lymphocytes Relative: 19 %
Lymphs Abs: 1.7 10*3/uL (ref 0.7–4.0)
MCH: 29.4 pg (ref 26.0–34.0)
MCHC: 31.8 g/dL (ref 30.0–36.0)
MCV: 92.6 fL (ref 80.0–100.0)
Monocytes Absolute: 0.5 10*3/uL (ref 0.1–1.0)
Monocytes Relative: 6 %
Neutro Abs: 6.2 10*3/uL (ref 1.7–7.7)
Neutrophils Relative %: 71 %
Platelets: 352 10*3/uL (ref 150–400)
RBC: 3.09 MIL/uL — ABNORMAL LOW (ref 4.22–5.81)
RDW: 15.2 % (ref 11.5–15.5)
WBC: 8.9 10*3/uL (ref 4.0–10.5)
nRBC: 0 % (ref 0.0–0.2)

## 2021-10-18 LAB — COMPREHENSIVE METABOLIC PANEL
ALT: 12 U/L (ref 0–44)
AST: 27 U/L (ref 15–41)
Albumin: 3.4 g/dL — ABNORMAL LOW (ref 3.5–5.0)
Alkaline Phosphatase: 98 U/L (ref 38–126)
Anion gap: 10 (ref 5–15)
BUN: 49 mg/dL — ABNORMAL HIGH (ref 8–23)
CO2: 21 mmol/L — ABNORMAL LOW (ref 22–32)
Calcium: 8.2 mg/dL — ABNORMAL LOW (ref 8.9–10.3)
Chloride: 105 mmol/L (ref 98–111)
Creatinine, Ser: 3.85 mg/dL — ABNORMAL HIGH (ref 0.61–1.24)
GFR, Estimated: 17 mL/min — ABNORMAL LOW (ref >60)
Glucose, Bld: 157 mg/dL — ABNORMAL HIGH (ref 70–99)
Potassium: 3.3 mmol/L — ABNORMAL LOW (ref 3.5–5.1)
Sodium: 136 mmol/L (ref 135–145)
Total Bilirubin: 0.6 mg/dL (ref 0.3–1.2)
Total Protein: 7.6 g/dL (ref 6.5–8.1)

## 2021-10-18 LAB — BRAIN NATRIURETIC PEPTIDE: B Natriuretic Peptide: 1009 pg/mL — ABNORMAL HIGH (ref 0.0–100.0)

## 2021-10-18 LAB — GLUCOSE, CAPILLARY: Glucose-Capillary: 147 mg/dL — ABNORMAL HIGH (ref 70–99)

## 2021-10-18 LAB — SARS CORONAVIRUS 2 BY RT PCR: SARS Coronavirus 2 by RT PCR: NEGATIVE

## 2021-10-18 MED ORDER — METOPROLOL SUCCINATE ER 50 MG PO TB24
50.0000 mg | ORAL_TABLET | Freq: Every day | ORAL | Status: DC
  Administered 2021-10-18 – 2021-10-21 (×4): 50 mg via ORAL
  Filled 2021-10-18 (×4): qty 1

## 2021-10-18 MED ORDER — TAMSULOSIN HCL 0.4 MG PO CAPS
0.4000 mg | ORAL_CAPSULE | Freq: Every day | ORAL | Status: DC
  Administered 2021-10-18 – 2021-10-21 (×4): 0.4 mg via ORAL
  Filled 2021-10-18 (×4): qty 1

## 2021-10-18 MED ORDER — INSULIN ASPART 100 UNIT/ML IJ SOLN
0.0000 [IU] | Freq: Every day | INTRAMUSCULAR | Status: DC
  Filled 2021-10-18: qty 0.05

## 2021-10-18 MED ORDER — ACETAMINOPHEN 325 MG PO TABS: 650.0000 mg | ORAL_TABLET | Freq: Four times a day (QID) | ORAL | Status: DC | PRN

## 2021-10-18 MED ORDER — FUROSEMIDE 10 MG/ML IJ SOLN
40.0000 mg | Freq: Once | INTRAMUSCULAR | Status: AC
  Administered 2021-10-18: 40 mg via INTRAVENOUS
  Filled 2021-10-18: qty 4

## 2021-10-18 MED ORDER — GABAPENTIN 300 MG PO CAPS
300.0000 mg | ORAL_CAPSULE | Freq: Once | ORAL | Status: AC
  Administered 2021-10-18: 300 mg via ORAL
  Filled 2021-10-18: qty 1

## 2021-10-18 MED ORDER — SODIUM BICARBONATE 650 MG PO TABS
650.0000 mg | ORAL_TABLET | Freq: Once | ORAL | Status: DC
  Filled 2021-10-18: qty 1

## 2021-10-18 MED ORDER — HYDRALAZINE HCL 50 MG PO TABS
100.0000 mg | ORAL_TABLET | Freq: Three times a day (TID) | ORAL | Status: DC
  Administered 2021-10-18 – 2021-10-22 (×11): 100 mg via ORAL
  Filled 2021-10-18 (×11): qty 2

## 2021-10-18 MED ORDER — INSULIN GLARGINE-YFGN 100 UNIT/ML ~~LOC~~ SOLN
25.0000 [IU] | Freq: Every day | SUBCUTANEOUS | Status: DC
  Administered 2021-10-18 – 2021-10-19 (×2): 25 [IU] via SUBCUTANEOUS
  Filled 2021-10-18 (×2): qty 0.25

## 2021-10-18 MED ORDER — AMLODIPINE BESYLATE 5 MG PO TABS
10.0000 mg | ORAL_TABLET | Freq: Once | ORAL | Status: AC
  Administered 2021-10-18: 10 mg via ORAL
  Filled 2021-10-18: qty 2

## 2021-10-18 MED ORDER — ATORVASTATIN CALCIUM 40 MG PO TABS
40.0000 mg | ORAL_TABLET | Freq: Once | ORAL | Status: AC
  Administered 2021-10-18: 40 mg via ORAL
  Filled 2021-10-18: qty 1

## 2021-10-18 MED ORDER — TRAZODONE HCL 50 MG PO TABS: 50.0000 mg | ORAL_TABLET | Freq: Every evening | ORAL | Status: DC | PRN

## 2021-10-18 MED ORDER — METOCLOPRAMIDE HCL 5 MG/ML IJ SOLN
10.0000 mg | Freq: Once | INTRAMUSCULAR | Status: AC
  Administered 2021-10-18: 10 mg via INTRAVENOUS
  Filled 2021-10-18: qty 2

## 2021-10-18 MED ORDER — AMLODIPINE BESYLATE 10 MG PO TABS
10.0000 mg | ORAL_TABLET | Freq: Every day | ORAL | Status: DC
  Administered 2021-10-18 – 2021-10-22 (×5): 10 mg via ORAL
  Filled 2021-10-18 (×5): qty 1

## 2021-10-18 MED ORDER — FINASTERIDE 5 MG PO TABS
5.0000 mg | ORAL_TABLET | Freq: Every day | ORAL | Status: DC
  Administered 2021-10-18 – 2021-10-19 (×2): 5 mg via ORAL
  Filled 2021-10-18 (×2): qty 1

## 2021-10-18 MED ORDER — ESCITALOPRAM OXALATE 10 MG PO TABS
20.0000 mg | ORAL_TABLET | Freq: Once | ORAL | Status: AC
  Administered 2021-10-18: 20 mg via ORAL
  Filled 2021-10-18: qty 2

## 2021-10-18 MED ORDER — ENOXAPARIN SODIUM 30 MG/0.3ML IJ SOSY
30.0000 mg | PREFILLED_SYRINGE | INTRAMUSCULAR | Status: DC
  Administered 2021-10-18 – 2021-10-21 (×4): 30 mg via SUBCUTANEOUS
  Filled 2021-10-18 (×4): qty 0.3

## 2021-10-18 MED ORDER — CEPHALEXIN 500 MG PO CAPS
500.0000 mg | ORAL_CAPSULE | Freq: Two times a day (BID) | ORAL | Status: DC
  Administered 2021-10-18 – 2021-10-22 (×8): 500 mg via ORAL
  Filled 2021-10-18 (×8): qty 1

## 2021-10-18 MED ORDER — OXYCODONE-ACETAMINOPHEN 5-325 MG PO TABS
1.0000 | ORAL_TABLET | Freq: Once | ORAL | Status: AC
  Administered 2021-10-18: 1 via ORAL
  Filled 2021-10-18: qty 1

## 2021-10-18 MED ORDER — ATORVASTATIN CALCIUM 40 MG PO TABS
40.0000 mg | ORAL_TABLET | Freq: Every day | ORAL | Status: DC
  Administered 2021-10-18 – 2021-10-22 (×5): 40 mg via ORAL
  Filled 2021-10-18 (×5): qty 1

## 2021-10-18 MED ORDER — CLOPIDOGREL BISULFATE 75 MG PO TABS
75.0000 mg | ORAL_TABLET | Freq: Every day | ORAL | Status: DC
  Administered 2021-10-18 – 2021-10-19 (×2): 75 mg via ORAL
  Filled 2021-10-18 (×2): qty 1

## 2021-10-18 MED ORDER — PANTOPRAZOLE SODIUM 40 MG PO TBEC
40.0000 mg | DELAYED_RELEASE_TABLET | Freq: Every day | ORAL | Status: DC
  Administered 2021-10-18 – 2021-10-22 (×5): 40 mg via ORAL
  Filled 2021-10-18 (×5): qty 1

## 2021-10-18 MED ORDER — PANTOPRAZOLE SODIUM 40 MG PO TBEC
40.0000 mg | DELAYED_RELEASE_TABLET | Freq: Once | ORAL | Status: AC
  Administered 2021-10-18: 40 mg via ORAL
  Filled 2021-10-18: qty 1

## 2021-10-18 MED ORDER — FUROSEMIDE 10 MG/ML IJ SOLN
80.0000 mg | Freq: Two times a day (BID) | INTRAMUSCULAR | Status: DC
  Administered 2021-10-18 – 2021-10-19 (×3): 80 mg via INTRAVENOUS
  Filled 2021-10-18 (×3): qty 8

## 2021-10-18 MED ORDER — HYDRALAZINE HCL 25 MG PO TABS
100.0000 mg | ORAL_TABLET | Freq: Once | ORAL | Status: AC
  Administered 2021-10-18: 100 mg via ORAL
  Filled 2021-10-18: qty 4

## 2021-10-18 MED ORDER — LISINOPRIL 10 MG PO TABS
10.0000 mg | ORAL_TABLET | Freq: Once | ORAL | Status: DC
  Filled 2021-10-18: qty 1

## 2021-10-18 MED ORDER — INSULIN ASPART 100 UNIT/ML IJ SOLN
0.0000 [IU] | Freq: Three times a day (TID) | INTRAMUSCULAR | Status: DC
  Administered 2021-10-20 – 2021-10-22 (×2): 3 [IU] via SUBCUTANEOUS
  Filled 2021-10-18: qty 0.15

## 2021-10-18 MED ORDER — ONDANSETRON HCL 4 MG PO TABS: 4.0000 mg | ORAL_TABLET | Freq: Four times a day (QID) | ORAL | Status: DC | PRN

## 2021-10-18 MED ORDER — OXYCODONE-ACETAMINOPHEN 5-325 MG PO TABS
1.0000 | ORAL_TABLET | ORAL | Status: DC | PRN
  Administered 2021-10-18 – 2021-10-19 (×2): 1 via ORAL
  Filled 2021-10-18 (×2): qty 1

## 2021-10-18 MED ORDER — NORTRIPTYLINE HCL 25 MG PO CAPS
25.0000 mg | ORAL_CAPSULE | Freq: Every day | ORAL | Status: DC
  Administered 2021-10-18 – 2021-10-21 (×4): 25 mg via ORAL
  Filled 2021-10-18 (×4): qty 1

## 2021-10-18 MED ORDER — ESCITALOPRAM OXALATE 20 MG PO TABS
20.0000 mg | ORAL_TABLET | Freq: Every day | ORAL | Status: DC
  Administered 2021-10-18 – 2021-10-22 (×5): 20 mg via ORAL
  Filled 2021-10-18 (×5): qty 1

## 2021-10-18 MED ORDER — ACETAMINOPHEN 650 MG RE SUPP: 650.0000 mg | Freq: Four times a day (QID) | RECTAL | Status: DC | PRN

## 2021-10-18 MED ORDER — POTASSIUM CHLORIDE CRYS ER 20 MEQ PO TBCR
40.0000 meq | EXTENDED_RELEASE_TABLET | Freq: Two times a day (BID) | ORAL | Status: DC
  Administered 2021-10-18 – 2021-10-20 (×5): 40 meq via ORAL
  Filled 2021-10-18 (×5): qty 2

## 2021-10-18 MED ORDER — ONDANSETRON HCL 4 MG/2ML IJ SOLN
4.0000 mg | Freq: Four times a day (QID) | INTRAMUSCULAR | Status: DC | PRN
  Administered 2021-10-19: 4 mg via INTRAVENOUS
  Filled 2021-10-18: qty 2

## 2021-10-18 MED ORDER — CEPHALEXIN 500 MG PO CAPS
500.0000 mg | ORAL_CAPSULE | Freq: Once | ORAL | Status: AC
  Administered 2021-10-18: 500 mg via ORAL
  Filled 2021-10-18: qty 1

## 2021-10-18 MED ORDER — OXYBUTYNIN CHLORIDE 5 MG PO TABS
10.0000 mg | ORAL_TABLET | Freq: Once | ORAL | Status: AC
  Administered 2021-10-18: 10 mg via ORAL
  Filled 2021-10-18: qty 2

## 2021-10-18 NOTE — Assessment & Plan Note (Signed)
Status post TURP 10/2, two days PTA by [name] -Continue cephalexin - Continue Percocet - Continue finasteride and Flomax - Ditropan x1 now for bladder spasms - Plan voiding trial tomorrow, and if fails will put an 103 French Foley and follow-up in the office

## 2021-10-18 NOTE — H&P (Signed)
History and Physical    Patient: Ryan Jenkins IRJ:[id number] DOB: 1960-08-17 DOA: 10/18/2021 DOS: the patient was seen and examined on 10/18/2021 PCP: [name], MD  Patient coming from: Home  Chief Complaint:  Chief Complaint  Patient presents with   Post-op Problem       HPI:  Ryan Jenkins is a 61 y.o. M with dCHF, CKD IV baseline 2.6, CAD never PCI, DM, gastroparesis, HTN and BPH s/p TURP on 10/2 who presented with leg swelling and dyspnea.  Patient had an uncomplicated TURP 2 days prior to admission.  Discharged yesterday, felt like breathing was at baseline, did notice some expected hematuria and bladder spasms at discharge.  This morning woke with leg swelling, increased shortness of breath from baseline, came to the ER.  In the ER, BNP >1000, CXR with edema.  Ambulated brief distance and desaturated to the 80s.  Given Lasix and hospitalists consulted for admission.      Review of Systems  Constitutional:  Negative for chills and fever.  Respiratory:  Positive for cough and shortness of breath. Negative for hemoptysis, sputum production and wheezing.   Cardiovascular:  Positive for chest pain, orthopnea and leg swelling.  Genitourinary:  Positive for dysuria and hematuria. Negative for flank pain.  All other systems reviewed and are negative.    Past Medical History:  Diagnosis Date   Anemia    Anginal pain ([place])    Anxiety    Arthritis    BPH with obstruction/lower urinary tract symptoms    CAD (coronary artery disease)    cardiologist--- dr Junius Roads badal;  11-06-2019 cardiac cath in [place] (result in care everywhere)  nonobstructive cad involing pRCA 60% (done in setting worseing CHF/ acute pulmonary edema requiring intubation/ AKI   Chronic combined systolic and diastolic CHF (congestive heart failure) ([place])    Chronic kidney disease, stage IV (severe) ([place])    nephrologist--- dr Carolin Sicks   Depression    Diabetic neuropathy ([place])    Dyspnea     Edema of both lower extremities    GERD (gastroesophageal reflux disease)    Heart murmur    History of community acquired pneumonia    admission 06-04-2021 [facility]  w/ ARF hypoxia w/ severe sepsis   Hyperlipidemia    Hypertension    Insulin dependent type 2 diabetes mellitus ([place])    Pneumonia    Retinopathy due to secondary diabetes ([place])    Uses walker    Vitamin B12 deficiency    Vitreous hemorrhage [facility])    Past Surgical History:  Procedure Laterality Date   CARDIAC CATHETERIZATION  11/06/2019   [place] in [place];    nonobstructive CAD , pRCA 60% (result in care everywhere)   CATARACT EXTRACTION W/ INTRAOCULAR LENS IMPLANT Bilateral 2017   TRANSURETHRAL RESECTION OF PROSTATE N/A 10/16/2021   Procedure: TRANSURETHRAL RESECTION OF THE PROSTATE (TURP);  Surgeon: Janith Lima, MD;  Location: [facility]S;  Service: Urology;  Laterality: N/A;   Social History:  reports that he has never smoked. He has never used smokeless tobacco. He reports that he does not drink alcohol and does not use drugs.  Allergies  Allergen Reactions   Claritin [Loratadine] Swelling    Joint swelling    Hydrochlorothiazide Other (See Comments)    Dizziness   Latex Hives   Lyrica [Pregabalin] Other (See Comments)    depression   Metformin And Related Other (See Comments)    GI    Family History  Problem Relation Age of Onset   Renal cancer Mother    Hypertension Mother    Pancreatic cancer Mother    Hypertension Sister    Stroke Sister    Leukemia Maternal Uncle    Sickle cell trait Maternal Aunt    Colon cancer Neg Hx    Esophageal cancer Neg Hx    Rectal cancer Neg Hx    Stomach cancer Neg Hx     Prior to Admission medications   Medication Sig Start Date End Date Taking? Authorizing Provider  acetaminophen (TYLENOL) 500 MG tablet Take 1,000 mg by mouth every 6 (six) hours as needed for mild pain or headache.    [provider]  amLODipine (NORVASC) 10 MG tablet  Take 1 tablet (10 mg total) by mouth daily. 02/08/21   Geovani Tootle, Suann Larry, MD  atorvastatin (LIPITOR) 40 MG tablet Take 1 tablet (40 mg total) by mouth daily. 02/08/21   Morris Markham, Suann Larry, MD  Blood Glucose Monitoring Suppl (TRUE METRIX METER) w/Device KIT Use as directed 07/20/20   Ladell Pier, MD  Blood Pressure Monitor DEVI Use as directed to check home blood pressure 2-3 times a week 07/13/20   Camillia Herter, NP  cephALEXin (KEFLEX) 500 MG capsule Take 1 capsule (500 mg total) by mouth 2 (two) times daily for 5 days. 10/16/21 10/21/21  Janith Lima, MD  Cholecalciferol (VITAMIN D3) 1.25 MG (50000 UT) CAPS Take 50,000 Units by mouth once a week. 09/12/21   [provider]  clopidogrel (PLAVIX) 75 MG tablet Take 1 tablet (75 mg total) by mouth daily. 07/05/21   de Yolanda Manges, Cortney E, NP  clotrimazole (LOTRIMIN) 1 % cream Apply 1 Application topically 2 (two) times daily as needed (rash). 08/01/21   [provider]  D-Mannose 350 MG CAPS Take 1,050 capsules by mouth in the morning and at bedtime.    [provider]  docusate sodium (COLACE) 100 MG capsule Take 1 capsule (100 mg total) by mouth daily as needed for up to 30 doses. 10/16/21   Janith Lima, MD  escitalopram (LEXAPRO) 20 MG tablet Take 1 tablet (20 mg total) by mouth daily. 07/20/20   Charlott Rakes, MD  famotidine (PEPCID) 40 MG tablet Take 40 mg by mouth daily. 08/08/21   [provider]  finasteride (PROSCAR) 5 MG tablet Take 1 tablet (5 mg total) by mouth daily. 07/20/20   Charlott Rakes, MD  furosemide (LASIX) 40 MG tablet Take 1 tablet (40 mg total) by mouth 2 (two) times daily. 02/08/21   Jasson Siegmann, Suann Larry, MD  gabapentin (NEURONTIN) 300 MG capsule Take 300 mg by mouth 3 (three) times daily. 05/26/21   [provider]  glucose blood (TRUE METRIX BLOOD GLUCOSE TEST) test strip Use as instructed 07/20/20   Ladell Pier, MD  hydrALAZINE (APRESOLINE) 100 MG tablet Take 1  tablet (100 mg total) by mouth 3 (three) times daily. 02/08/21   Artist Bloom, Suann Larry, MD  Insulin Syringe-Needle U-100 (TRUEPLUS INSULIN SYRINGE) 31G X 5/16" 0.3 ML MISC Use to inject Levemir at bedtime. 07/20/20   Charlott Rakes, MD  LANTUS SOLOSTAR 100 UNIT/ML Solostar Pen Inject 25 Units into the skin at bedtime. Patient taking differently: Inject 30 Units into the skin at bedtime. 07/05/21   de Yolanda Manges, Cortney E, NP  lisinopril (ZESTRIL) 10 MG tablet Take 10 mg by mouth daily. 04/24/21   [provider]  meclizine (ANTIVERT) 25 MG tablet Take 25 mg by mouth  3 (three) times daily as needed for dizziness. 08/08/21   [provider]  metoCLOPramide (REGLAN) 10 MG tablet Take 0.5 tablets (5 mg total) by mouth every 8 (eight) hours as needed for nausea or vomiting (Or headache). 07/09/21   Godfrey Pick, MD  metoprolol succinate (TOPROL-XL) 50 MG 24 hr tablet Take 1 tablet (50 mg total) by mouth at bedtime. Take with or immediately following a meal. 02/08/21   Inaara Tye, Suann Larry, MD  nortriptyline (PAMELOR) 25 MG capsule Take 1 capsule (25 mg total) by mouth at bedtime. 07/20/20   Charlott Rakes, MD  oxyCODONE-acetaminophen (PERCOCET) 5-325 MG tablet Take 1 tablet by mouth every 4 (four) hours as needed for up to 18 doses for severe pain. 10/16/21   Janith Lima, MD  pantoprazole (PROTONIX) 40 MG tablet Take 1 tablet (40 mg total) by mouth daily. 02/08/21   Damaya Channing, Suann Larry, MD  potassium chloride (KLOR-CON M) 10 MEQ tablet Take 1 tablet (10 mEq total) by mouth daily. 02/08/21   Itzae Miralles, Suann Larry, MD  sodium bicarbonate 650 MG tablet Take 650 mg by mouth 2 (two) times daily. 09/20/21   [provider]  tamsulosin (FLOMAX) 0.4 MG CAPS capsule Take 0.4 mg by mouth at bedtime. 05/25/21   [provider]  traZODone (DESYREL) 50 MG tablet Take 3 tablets (150 mg total) by mouth at bedtime. Patient taking differently: Take 50 mg by mouth at bedtime as needed for  sleep. 07/20/20   Charlott Rakes, MD  triamcinolone ointment (KENALOG) 0.5 % Apply 1 Application topically 2 (two) times daily as needed (rash). 08/31/21   [provider]  TRUEplus Lancets 28G MISC Use as directed 07/20/20   Ladell Pier, MD    Physical Exam: Vitals:   10/18/21 1030 10/18/21 1100 10/18/21 1130 10/18/21 1234  BP: (!) 144/89 (!) 155/79 (!) 155/98 (!) 166/80  Pulse: 73 73 76 76  Resp: 17 15 14  (!) 22  Temp:  98 F (36.7 C)  98.2 F (36.8 C)  TempSrc:    Oral  SpO2: 90% 90% 92% 94%   Adult male, large in stature, lying in bed, no acute distress, appears well-nourished, appears tired and weak, but grooming appropriate Anicteric sclera, conjunctival pink, lids and lashes normal.  No nasal deformity, discharge, or epistaxis.  Dentition in good repair, lips normal, oropharynx moist, no oral lesions Heart rate normal, loud S2, no significant murmurs that I can appreciate, 1+ edema to the shins of both legs, JVP slightly elevated above the collarbone at 30 degrees Respiratory rate normal, faint crackles at both bases, no wheezing Abdomen soft without tenderness palpation or guarding, no ascites or distention Attention normal, affect appropriate, judgment and insight appear normal Face metric, speech fluent, extraocular movements intact, moves all extremities with normal strength and coordination    Data Reviewed: Discussed with urology, [name] Patient metabolic panel notable for worsening creatinine up to 3.8, normal glucose, potassium level Transaminases normal BNP 1000 Chest x-ray personally reviewed, shows perihilar opacities, faint edema bilaterally Hemogram shows mild anemia, stable from relative to baseline EKG personally reviewed, shows sinus rhythm, early repole pattern, LVH        Assessment and Plan: * Acute on chronic diastolic CHF (congestive heart failure) (HCC) EF 60 to 65% with grade 2 diastolic function and no valvular disease in May  2023. Presents now with leg swelling, orthopnea, dyspnea on exertion, chest x-ray showing edema, and elevated BNP. - IV Lasix 80 twice daily - Potassium supplement -  Strict ins and outs, daily BMP, daily weight - Hold bicarb for now  Anemia in chronic kidney disease (CKD) hemoGlobin essentially stable from baseline around 10. - Trend hemoglobin in setting of mild gross hematuria  Hypokalemia - Supplement potassium  BPH (benign prostatic hyperplasia) Status post TURP 10/2, two days PTA by [name] -Continue cephalexin - Continue Percocet - Continue finasteride and Flomax - Ditropan x1 now for bladder spasms - Plan voiding trial tomorrow, and if fails will put an 54 French Foley and follow-up in the office  Coronary artery disease - Continue atorvastatin, Plavix (okay given minimal urine blood) - Hold ACE inhibitor given renal function   Acute renal failure superimposed on stage 4 chronic kidney disease (HCC) Creatinine up to 3.8 from baseline 2.6, although I suspect his baseline is trending up to 3s recently. -Hold lisinopril for now - Stop gabapentin given worsening renal function   Hyperlipidemia - Continue atorvastatin  Essential hypertension Blood pressure elevated - Continue amlodipine, metoprolol, hydralazine - Continue IV Lasix - Hold ACE inhibitor for now given renal function  Type 2 diabetes mellitus with peripheral neuropathy (HCC) Glucose controlled here - Continue home glargine - Sliding scale correction insulin ordered   GERD - Continue PPI  Mood disorder - Continue escitalopram  Diabetic neuropathy - Hold gabapentin - Continue nortriptyline      Advance Care Planning: Full code  Consults: None needed  Family Communication: None present  Severity of Illness: The appropriate patient status for this patient is OBSERVATION. Observation status is judged to be reasonable and necessary in order to provide the required intensity of service to  ensure the patient's safety. The patient's presenting symptoms, physical exam findings, and initial radiographic and laboratory data in the context of their medical condition is felt to place them at decreased risk for further clinical deterioration. Furthermore, it is anticipated that the patient will be medically stable for discharge from the hospital within 2 midnights of admission.   Author: Edwin Dada, MD 10/18/2021 2:17 PM  For on call review [email or web address].

## 2021-10-18 NOTE — Assessment & Plan Note (Signed)
Continue atorvastatin

## 2021-10-18 NOTE — Assessment & Plan Note (Signed)
Treated and resolved 

## 2021-10-18 NOTE — Assessment & Plan Note (Signed)
Creatinine up to 3.8 from baseline 2.6, although I suspect his baseline is trending up to 3s recently. -Hold lisinopril for now - Stop gabapentin given worsening renal function

## 2021-10-18 NOTE — ED Triage Notes (Signed)
BIBA for bleeding around foley and pain when urinating that started last night. N/V noted on arrival TURP on 10/2 and d/c yesterday.  Hx CHF and DM. Ems cbg-150 Increase swelling to bilateral legs with shob and chest discomfort per patient.

## 2021-10-18 NOTE — ED Notes (Signed)
Pt ambulated around room with walker oxygen stats dropped to 82 pt states he felt dizzy while walking.

## 2021-10-18 NOTE — Assessment & Plan Note (Signed)
-   Continue atorvastatin, Plavix (okay given minimal urine blood) - Hold ACE inhibitor given renal function

## 2021-10-18 NOTE — Assessment & Plan Note (Addendum)
EF 60 to 65% with grade 2 diastolic function and no valvular disease in May 2023. Presents now with leg swelling, orthopnea, dyspnea on exertion, chest x-ray showing edema, and elevated BNP. - IV Lasix 80 twice daily - Potassium supplement - Strict ins and outs, daily BMP, daily weight - Hold bicarb for now

## 2021-10-18 NOTE — Assessment & Plan Note (Signed)
Glucose controlled here - Continue home glargine - Sliding scale correction insulin ordered

## 2021-10-18 NOTE — Assessment & Plan Note (Signed)
hemoGlobin essentially stable from baseline around 10. - Trend hemoglobin in setting of mild gross hematuria

## 2021-10-18 NOTE — Assessment & Plan Note (Signed)
Blood pressure elevated - Continue amlodipine, metoprolol, hydralazine - Continue IV Lasix - Hold ACE inhibitor for now given renal function

## 2021-10-18 NOTE — ED Provider Notes (Signed)
[facility] DEPT Provider Note   CSN: [id number] Arrival date & time: 10/18/21  0737     History  Chief Complaint  Patient presents with   Post-op Problem    Ryan Jenkins is a 61 y.o. male.  Patient is a 61 year old male with a history of diabetes, hypertension, CHF, CAD, CKD with recent TURP postop day 2 who is presenting to the emergency room with several complaints.  Patient reports he went home yesterday but since being home he has had several episodes of developing a severe pain and having urine leak around the catheter with some blood in it.  He has not did his catheter since being home and has approximately 500 mL in the bag.  He denies any fever or vomiting but did have some dry heaving this morning with the severe pain.  Secondly he is complaining of feeling a little more short of breath with some chest tightness.  He has had a nonproductive cough as well.  He has noticed more swelling in his lower extremities bilaterally.  He does report being under general anesthesia and being intubated so he has had a sore throat since the procedure.  He denies any feeling of his throat swelling or difficulty getting air in.  He has had no voice changes.  He has been taking his furosemide as planned and was planning on restarting his Plavix today.  He reports he spoke with the urology office today and they asked him to come in and be checked.  The history is provided by the patient and medical records.       Home Medications Prior to Admission medications   Medication Sig Start Date End Date Taking? Authorizing Provider  acetaminophen (TYLENOL) 500 MG tablet Take 1,000 mg by mouth every 6 (six) hours as needed for mild pain or headache.   Yes [provider]  amLODipine (NORVASC) 10 MG tablet Take 1 tablet (10 mg total) by mouth daily. 02/08/21  Yes Danford, Suann Larry, MD  atorvastatin (LIPITOR) 40 MG tablet Take 1 tablet (40 mg total) by mouth  daily. 02/08/21  Yes Danford, Suann Larry, MD  cephALEXin (KEFLEX) 500 MG capsule Take 1 capsule (500 mg total) by mouth 2 (two) times daily for 5 days. 10/16/21 10/21/21 Yes Janith Lima, MD  Cholecalciferol (VITAMIN D3) 1.25 MG (50000 UT) CAPS Take 50,000 Units by mouth once a week. 09/12/21  Yes [provider]  clopidogrel (PLAVIX) 75 MG tablet Take 1 tablet (75 mg total) by mouth daily. 07/05/21  Yes de Yolanda Manges, Cortney E, NP  clotrimazole (LOTRIMIN) 1 % cream Apply 1 Application topically 2 (two) times daily as needed (rash). 08/01/21  Yes [provider]  D-Mannose 350 MG CAPS Take 1,050 capsules by mouth in the morning and at bedtime.   Yes [provider]  docusate sodium (COLACE) 100 MG capsule Take 1 capsule (100 mg total) by mouth daily as needed for up to 30 doses. 10/16/21  Yes Janith Lima, MD  escitalopram (LEXAPRO) 20 MG tablet Take 1 tablet (20 mg total) by mouth daily. 07/20/20  Yes Charlott Rakes, MD  famotidine (PEPCID) 40 MG tablet Take 40 mg by mouth daily. 08/08/21  Yes [provider]  finasteride (PROSCAR) 5 MG tablet Take 1 tablet (5 mg total) by mouth daily. 07/20/20  Yes Charlott Rakes, MD  furosemide (LASIX) 40 MG tablet Take 1 tablet (40 mg total) by mouth 2 (two) times daily. 02/08/21  Yes  Danford, Suann Larry, MD  gabapentin (NEURONTIN) 300 MG capsule Take 300 mg by mouth 3 (three) times daily. 05/26/21  Yes [provider]  hydrALAZINE (APRESOLINE) 100 MG tablet Take 1 tablet (100 mg total) by mouth 3 (three) times daily. 02/08/21  Yes Danford, Suann Larry, MD  LANTUS SOLOSTAR 100 UNIT/ML Solostar Pen Inject 25 Units into the skin at bedtime. Patient taking differently: Inject 30 Units into the skin at bedtime. 07/05/21  Yes de Yolanda Manges, Cortney E, NP  lisinopril (ZESTRIL) 10 MG tablet Take 10 mg by mouth daily. 04/24/21  Yes [provider]  meclizine (ANTIVERT) 25 MG tablet Take 25 mg by mouth 3 (three) times daily  as needed for dizziness. 08/08/21  Yes [provider]  metoCLOPramide (REGLAN) 10 MG tablet Take 0.5 tablets (5 mg total) by mouth every 8 (eight) hours as needed for nausea or vomiting (Or headache). 07/09/21  Yes Godfrey Pick, MD  metoprolol succinate (TOPROL-XL) 50 MG 24 hr tablet Take 1 tablet (50 mg total) by mouth at bedtime. Take with or immediately following a meal. 02/08/21  Yes Danford, Suann Larry, MD  nortriptyline (PAMELOR) 25 MG capsule Take 1 capsule (25 mg total) by mouth at bedtime. 07/20/20  Yes Charlott Rakes, MD  oxyCODONE-acetaminophen (PERCOCET) 5-325 MG tablet Take 1 tablet by mouth every 4 (four) hours as needed for up to 18 doses for severe pain. 10/16/21  Yes Janith Lima, MD  pantoprazole (PROTONIX) 40 MG tablet Take 1 tablet (40 mg total) by mouth daily. 02/08/21  Yes Danford, Suann Larry, MD  potassium chloride (KLOR-CON M) 10 MEQ tablet Take 1 tablet (10 mEq total) by mouth daily. 02/08/21  Yes Danford, Suann Larry, MD  sodium bicarbonate 650 MG tablet Take 650 mg by mouth 2 (two) times daily. 09/20/21  Yes [provider]  tamsulosin (FLOMAX) 0.4 MG CAPS capsule Take 0.4 mg by mouth at bedtime. 05/25/21  Yes [provider]  traZODone (DESYREL) 50 MG tablet Take 3 tablets (150 mg total) by mouth at bedtime. Patient taking differently: Take 50 mg by mouth at bedtime as needed for sleep. 07/20/20  Yes Charlott Rakes, MD  triamcinolone ointment (KENALOG) 0.5 % Apply 1 Application topically 2 (two) times daily as needed (rash). 08/31/21  Yes [provider]  Blood Glucose Monitoring Suppl (TRUE METRIX METER) w/Device KIT Use as directed 07/20/20   Ladell Pier, MD  Blood Pressure Monitor DEVI Use as directed to check home blood pressure 2-3 times a week 07/13/20   Camillia Herter, NP  glucose blood (TRUE METRIX BLOOD GLUCOSE TEST) test strip Use as instructed 07/20/20   Ladell Pier, MD  Insulin Syringe-Needle U-100 (TRUEPLUS INSULIN  SYRINGE) 31G X 5/16" 0.3 ML MISC Use to inject Levemir at bedtime. 07/20/20   Charlott Rakes, MD  TRUEplus Lancets 28G MISC Use as directed 07/20/20   Ladell Pier, MD      Allergies    Claritin [loratadine], Hydrochlorothiazide, Latex, Lyrica [pregabalin], and Metformin and related    Review of Systems   Review of Systems  Physical Exam Updated Vital Signs BP (!) 166/80 (BP Location: Right Arm)   Pulse 76   Temp 98.2 F (36.8 C) (Oral)   Resp (!) 22   SpO2 94%  Physical Exam Vitals and nursing note reviewed.  Constitutional:      General: He is not in acute distress.    Appearance: He is well-developed.  HENT:     Head: Normocephalic  and atraumatic.  Eyes:     Conjunctiva/sclera: Conjunctivae normal.     Pupils: Pupils are equal, round, and reactive to light.  Cardiovascular:     Rate and Rhythm: Normal rate and regular rhythm.     Heart sounds: No murmur heard. Pulmonary:     Effort: Pulmonary effort is normal. No respiratory distress.     Breath sounds: Examination of the right-lower field reveals rales. Examination of the left-lower field reveals rales. Rales present. No wheezing.  Abdominal:     General: There is no distension.     Palpations: Abdomen is soft.     Tenderness: There is abdominal tenderness in the suprapubic area. There is no right CVA tenderness, left CVA tenderness, guarding or rebound.  Genitourinary:    Comments: Uncircumcised.  Catheter in place currently draining yellow urine Musculoskeletal:        General: No tenderness. Normal range of motion.     Cervical back: Normal range of motion and neck supple.     Right lower leg: Edema present.     Left lower leg: Edema present.     Comments: 1+ pitting edema in bilateral ankles  Skin:    General: Skin is warm and dry.     Findings: No erythema or rash.  Neurological:     Mental Status: He is alert and oriented to person, place, and time. Mental status is at baseline.  Psychiatric:         Mood and Affect: Mood normal.        Behavior: Behavior normal.     ED Results / Procedures / Treatments   Labs (all labs ordered are listed, but only abnormal results are displayed) Labs Reviewed  BRAIN NATRIURETIC PEPTIDE - Abnormal; Notable for the following components:      Result Value   B Natriuretic Peptide 1,009.0 (*)    All other components within normal limits  CBC WITH DIFFERENTIAL/PLATELET - Abnormal; Notable for the following components:   RBC 3.09 (*)    Hemoglobin 9.1 (*)    HCT 28.6 (*)    All other components within normal limits  COMPREHENSIVE METABOLIC PANEL - Abnormal; Notable for the following components:   Potassium 3.3 (*)    CO2 21 (*)    Glucose, Bld 157 (*)    BUN 49 (*)    Creatinine, Ser 3.85 (*)    Calcium 8.2 (*)    Albumin 3.4 (*)    GFR, Estimated 17 (*)    All other components within normal limits  SARS CORONAVIRUS 2 BY RT PCR    EKG EKG Interpretation  Date/Time:  Wednesday October 18 2021 08:55:03 EDT Ventricular Rate:  74 PR Interval:  186 QRS Duration: 117 QT Interval:  452 QTC Calculation: 502 R Axis:   -11 Text Interpretation: Sinus rhythm LVH with secondary repolarization abnormality Anterior Q waves, possibly due to LVH Prolonged QT interval more pronounced t-wave inversion in lateral leads than prior EKG Confirmed by Blanchie Dessert [phone]) on 10/18/2021 9:32:11 AM  Radiology DG Chest Port 1 View  Result Date: 10/18/2021 CLINICAL DATA:  Cough, shortness of breath, prostate surgery 2 days ago; history coronary artery disease, hypertension, type II diabetes mellitus, CHF, stage IV chronic kidney disease EXAM: PORTABLE CHEST 1 VIEW COMPARISON:  Portable exam 0753 hours compared to 07/09/2021 FINDINGS: Borderline enlargement of cardiac silhouette. Mediastinal contours and pulmonary vascularity normal. Accentuation of perihilar interstitial markings with few Kerley B lines at lateral RIGHT lung base suggestive of minimal  pulmonary  edema. No segmental consolidation, pleural effusion, or pneumothorax. Osseous structures unremarkable. IMPRESSION: Accentuated perihilar markings and RIGHT basilar Kerley B-lines, question minimal pulmonary edema. Electronically Signed   By: Lavonia Dana M.D.   On: 10/18/2021 08:07    Procedures Procedures    Medications Ordered in ED Medications  potassium chloride SA (KLOR-CON M) CR tablet 40 mEq (40 mEq Oral Given 10/18/21 1355)  oxyCODONE-acetaminophen (PERCOCET/ROXICET) 5-325 MG per tablet 1 tablet (has no administration in time range)  acetaminophen (TYLENOL) tablet 650 mg (has no administration in time range)    Or  acetaminophen (TYLENOL) suppository 650 mg (has no administration in time range)  oxyCODONE-acetaminophen (PERCOCET/ROXICET) 5-325 MG per tablet 1 tablet (1 tablet Oral Given 10/18/21 0742)  amLODipine (NORVASC) tablet 10 mg (10 mg Oral Given 10/18/21 0824)  atorvastatin (LIPITOR) tablet 40 mg (40 mg Oral Given 10/18/21 0824)  cephALEXin (KEFLEX) capsule 500 mg (500 mg Oral Given 10/18/21 0824)  escitalopram (LEXAPRO) tablet 20 mg (20 mg Oral Given 10/18/21 0824)  gabapentin (NEURONTIN) capsule 300 mg (300 mg Oral Given 10/18/21 0824)  hydrALAZINE (APRESOLINE) tablet 100 mg (100 mg Oral Given 10/18/21 0824)  pantoprazole (PROTONIX) EC tablet 40 mg (40 mg Oral Given 10/18/21 0824)  furosemide (LASIX) injection 40 mg (40 mg Intravenous Given 10/18/21 0843)  metoCLOPramide (REGLAN) injection 10 mg (10 mg Intravenous Given 10/18/21 1142)  oxybutynin (DITROPAN) tablet 10 mg (10 mg Oral Given 10/18/21 1438)    ED Course/ Medical Decision Making/ A&P                           Medical Decision Making Amount and/or Complexity of Data Reviewed Independent Historian: EMS External Data Reviewed: notes.    Details: Recent hospitalization Labs: ordered. Decision-making details documented in ED Course. Radiology: ordered and independent interpretation performed. Decision-making details  documented in ED Course. ECG/medicine tests: ordered and independent interpretation performed. Decision-making details documented in ED Course.  Risk OTC drugs. Prescription drug management. Decision regarding hospitalization.   Pt with multiple medical problems and comorbidities and presenting today with a complaint that caries a high risk for morbidity and mortality.  Here today postop day 2 after a TURP.  Patient is having urine leaking around his catheter and concern for urinary retention versus bladder spasm.  Patient does have urine in his catheter tubing at this time.  Spoke with urology Dr. Jeffie Pollock who recommended bladder scan and if that is normal then he will most likely need medication for bladder spasm.  However patient also complains of having some increased shortness of breath, nonproductive cough and increased swelling in his lower extremities.  Patient does have a history of CHF, was recently intubated with surgery and has been off his Plavix.  He is not complaining of symptoms suggestive of ACS at this time but does show some evidence of fluid overload and concern for CHF exacerbation.  Labs are pending.  Patient oxygen saturation was 90% when EMS arrived today.  They reported stable vital signs and normal CBG.  Patient has not taken his blood pressure medications this morning.  Pt given home BP meds.  Holding on lasix until labs return.  3:59 PM I independently interpreted patient's EKG and labs.  EKG does have more pronounced T wave inversion laterally but otherwise is unchanged, BNP is elevated today at 1000 which seems similar to prior, CBC with stable anemia with hemoglobin of 9 normal white count and platelets, CMP with mild  hypokalemia of 3.3, creatinine today of 3.85 which is slightly up from his baseline of 3.4, LFTs and anion gap are normal.  Patient was given IV Lasix due to concern for fluid overload.  In 1 hour he has put out approximately 100 mL.  We will continue to monitor.  I have independently visualized and interpreted pt's images today.  Chest x-ray today with mild pulmonary edema.  We will see if patient continues to diuresis and can maintain his oxygen saturation.  Currently he is going between 88 and 91% on room air.  He reports prior to having surgery he used a walker but he was able to get around his house without feeling winded.  He has not really got up and move much since being home.  We will reevaluate in approximately 1 to 2 hours reevaluate for urine output and attempt to ambulate. Pt desated to 82% with ambulating.  He has put out 529m total but will need admission for diuresis.  CRITICAL CARE Performed by: [facility] Total critical care time: 30 minutes Critical care time was exclusive of separately billable procedures and treating other patients. Critical care was necessary to treat or prevent imminent or life-threatening deterioration. Critical care was time spent personally by me on the following activities: development of treatment plan with patient and/or surrogate as well as nursing, discussions with consultants, evaluation of patient's response to treatment, examination of patient, obtaining history from patient or surrogate, ordering and performing treatments and interventions, ordering and review of laboratory studies, ordering and review of radiographic studies, pulse oximetry and re-evaluation of patient's condition.           Final Clinical Impression(s) / ED Diagnoses Final diagnoses:  Bladder spasm  Acute congestive heart failure, unspecified heart failure type ([facility]    Rx / DC Orders ED Discharge Orders     None         PBlanchie Dessert MD 10/18/21 1600

## 2021-10-18 NOTE — Hospital Course (Signed)
Ryan Jenkins is a 61 y.o. M with dCHF, CKD IV baseline 2.6, CAD never PCI, DM, gastroparesis, HTN and BPH s/p TURP on 10/2 who presented with leg swelling and dyspnea.  Patient had an uncomplicated TURP 2 days prior to admission.  Discharged yesterday, felt like breathing was at baseline, did notice some expected hematuria and bladder spasms at discharge.  This morning woke with leg swelling, increased shortness of breath from baseline, came to the ER.  In the ER, BNP >1000, CXR with edema.  Ambulated brief distance and desaturated to the 80s.  Given Lasix and hospitalists consulted for admission.

## 2021-10-19 DIAGNOSIS — Z9079 Acquired absence of other genital organ(s): Secondary | ICD-10-CM | POA: Diagnosis not present

## 2021-10-19 DIAGNOSIS — I130 Hypertensive heart and chronic kidney disease with heart failure and stage 1 through stage 4 chronic kidney disease, or unspecified chronic kidney disease: Secondary | ICD-10-CM | POA: Diagnosis present

## 2021-10-19 DIAGNOSIS — N171 Acute kidney failure with acute cortical necrosis: Secondary | ICD-10-CM | POA: Diagnosis not present

## 2021-10-19 DIAGNOSIS — R0602 Shortness of breath: Secondary | ICD-10-CM | POA: Diagnosis present

## 2021-10-19 DIAGNOSIS — Z79899 Other long term (current) drug therapy: Secondary | ICD-10-CM | POA: Diagnosis not present

## 2021-10-19 DIAGNOSIS — E785 Hyperlipidemia, unspecified: Secondary | ICD-10-CM | POA: Diagnosis present

## 2021-10-19 DIAGNOSIS — I2510 Atherosclerotic heart disease of native coronary artery without angina pectoris: Secondary | ICD-10-CM | POA: Diagnosis present

## 2021-10-19 DIAGNOSIS — F39 Unspecified mood [affective] disorder: Secondary | ICD-10-CM | POA: Diagnosis present

## 2021-10-19 DIAGNOSIS — I5033 Acute on chronic diastolic (congestive) heart failure: Secondary | ICD-10-CM | POA: Diagnosis not present

## 2021-10-19 DIAGNOSIS — E1122 Type 2 diabetes mellitus with diabetic chronic kidney disease: Secondary | ICD-10-CM | POA: Diagnosis present

## 2021-10-19 DIAGNOSIS — N3289 Other specified disorders of bladder: Secondary | ICD-10-CM | POA: Diagnosis present

## 2021-10-19 DIAGNOSIS — F32A Depression, unspecified: Secondary | ICD-10-CM | POA: Diagnosis present

## 2021-10-19 DIAGNOSIS — E876 Hypokalemia: Secondary | ICD-10-CM | POA: Diagnosis present

## 2021-10-19 DIAGNOSIS — I509 Heart failure, unspecified: Secondary | ICD-10-CM | POA: Diagnosis not present

## 2021-10-19 DIAGNOSIS — R310 Gross hematuria: Secondary | ICD-10-CM | POA: Diagnosis present

## 2021-10-19 DIAGNOSIS — E1142 Type 2 diabetes mellitus with diabetic polyneuropathy: Secondary | ICD-10-CM | POA: Diagnosis present

## 2021-10-19 DIAGNOSIS — D631 Anemia in chronic kidney disease: Secondary | ICD-10-CM | POA: Diagnosis present

## 2021-10-19 DIAGNOSIS — N184 Chronic kidney disease, stage 4 (severe): Secondary | ICD-10-CM | POA: Diagnosis present

## 2021-10-19 DIAGNOSIS — Z20822 Contact with and (suspected) exposure to covid-19: Secondary | ICD-10-CM | POA: Diagnosis present

## 2021-10-19 DIAGNOSIS — N179 Acute kidney failure, unspecified: Secondary | ICD-10-CM | POA: Diagnosis present

## 2021-10-19 DIAGNOSIS — E11649 Type 2 diabetes mellitus with hypoglycemia without coma: Secondary | ICD-10-CM | POA: Diagnosis not present

## 2021-10-19 DIAGNOSIS — I5043 Acute on chronic combined systolic (congestive) and diastolic (congestive) heart failure: Secondary | ICD-10-CM | POA: Diagnosis present

## 2021-10-19 DIAGNOSIS — K219 Gastro-esophageal reflux disease without esophagitis: Secondary | ICD-10-CM | POA: Diagnosis present

## 2021-10-19 DIAGNOSIS — Z23 Encounter for immunization: Secondary | ICD-10-CM | POA: Diagnosis not present

## 2021-10-19 DIAGNOSIS — E11319 Type 2 diabetes mellitus with unspecified diabetic retinopathy without macular edema: Secondary | ICD-10-CM | POA: Diagnosis present

## 2021-10-19 DIAGNOSIS — Z832 Family history of diseases of the blood and blood-forming organs and certain disorders involving the immune mechanism: Secondary | ICD-10-CM | POA: Diagnosis not present

## 2021-10-19 DIAGNOSIS — Z8249 Family history of ischemic heart disease and other diseases of the circulatory system: Secondary | ICD-10-CM | POA: Diagnosis not present

## 2021-10-19 DIAGNOSIS — Z794 Long term (current) use of insulin: Secondary | ICD-10-CM | POA: Diagnosis not present

## 2021-10-19 LAB — CBC
HCT: 26.3 % — ABNORMAL LOW (ref 39.0–52.0)
Hemoglobin: 8.5 g/dL — ABNORMAL LOW (ref 13.0–17.0)
MCH: 29.4 pg (ref 26.0–34.0)
MCHC: 32.3 g/dL (ref 30.0–36.0)
MCV: 91 fL (ref 80.0–100.0)
Platelets: 331 10*3/uL (ref 150–400)
RBC: 2.89 MIL/uL — ABNORMAL LOW (ref 4.22–5.81)
RDW: 14.9 % (ref 11.5–15.5)
WBC: 6.9 10*3/uL (ref 4.0–10.5)
nRBC: 0 % (ref 0.0–0.2)

## 2021-10-19 LAB — BASIC METABOLIC PANEL
Anion gap: 9 (ref 5–15)
BUN: 51 mg/dL — ABNORMAL HIGH (ref 8–23)
CO2: 21 mmol/L — ABNORMAL LOW (ref 22–32)
Calcium: 8.2 mg/dL — ABNORMAL LOW (ref 8.9–10.3)
Chloride: 108 mmol/L (ref 98–111)
Creatinine, Ser: 3.56 mg/dL — ABNORMAL HIGH (ref 0.61–1.24)
GFR, Estimated: 19 mL/min — ABNORMAL LOW (ref >60)
Glucose, Bld: 77 mg/dL (ref 70–99)
Potassium: 3.5 mmol/L (ref 3.5–5.1)
Sodium: 138 mmol/L (ref 135–145)

## 2021-10-19 LAB — HIV ANTIBODY (ROUTINE TESTING W REFLEX): HIV Screen 4th Generation wRfx: NONREACTIVE

## 2021-10-19 LAB — GLUCOSE, CAPILLARY
Glucose-Capillary: 70 mg/dL (ref 70–99)
Glucose-Capillary: 79 mg/dL (ref 70–99)
Glucose-Capillary: 75 mg/dL (ref 70–99)
Glucose-Capillary: 123 mg/dL — ABNORMAL HIGH (ref 70–99)

## 2021-10-19 MED ORDER — CHLORHEXIDINE GLUCONATE CLOTH 2 % EX PADS
6.0000 | MEDICATED_PAD | Freq: Every day | CUTANEOUS | Status: DC
  Administered 2021-10-20 – 2021-10-21 (×2): 6 via TOPICAL

## 2021-10-19 MED ORDER — INFLUENZA VAC SPLIT QUAD 0.5 ML IM SUSY
0.5000 mL | PREFILLED_SYRINGE | INTRAMUSCULAR | Status: AC
  Administered 2021-10-20: 0.5 mL via INTRAMUSCULAR
  Filled 2021-10-19: qty 0.5

## 2021-10-19 NOTE — Progress Notes (Signed)
Progress Note   Patient: Ryan Jenkins DOB: 1960-12-12 DOA: 10/18/2021     0 DOS: the patient was seen and examined on 10/19/2021   Brief hospital course: 61 y.o. M with dCHF, CKD IV baseline 2.6, CAD never PCI, DM, gastroparesis, HTN and BPH s/p TURP on 10/2 who presented with leg swelling and dyspnea.   Patient had an uncomplicated TURP 2 days prior to admission.  Discharged yesterday, felt like breathing was at baseline, did notice some expected hematuria and bladder spasms at discharge.   This morning woke with leg swelling, increased shortness of breath from baseline, came to the ER.   In the ER, BNP >1000, CXR with edema.  Ambulated brief distance and desaturated to the 80s.  Given Lasix and hospitalists consulted for admission.  Assessment and Plan: * Acute on chronic diastolic CHF (congestive heart failure) (HCC) EF 60 to 65% with grade 2 diastolic function and no valvular disease in May 2023. Presents now with leg swelling, orthopnea, dyspnea on exertion, chest x-ray showing edema, and elevated BNP. - continued on IV Lasix 80 twice daily - Potassium supplement - Strict ins and outs, daily BMP, daily weight - Hold bicarb for now - recheck bmet in AM   Anemia in chronic kidney disease (CKD) - Trend hemoglobin in setting of mild gross hematuria -hgb down slightly ot 8.5 this AM -Will briefly hold plavix for now, keep foley in place until urine clears   Hypokalemia - Supplement potassium   BPH (benign prostatic hyperplasia) Status post TURP 10/2, two days PTA by [name] -Continue cephalexin - Continue Percocet - Continue finasteride and Flomax - Ditropan x1 now for bladder spasms - Initial plan was for voiding trial and if fails will put an 84 French Foley and follow-up in the office. Given dark urine this AM, would keep cath in place for now   Coronary artery disease - Continue atorvastatin - Hold ACE inhibitor given renal function -briefly hold  plavix given slight drop in hgb and dark urine - Recheck cbc in AM     Acute renal failure superimposed on stage 4 chronic kidney disease (HCC) Creatinine up to 3.8 from baseline 2.6, although I suspect his baseline is trending up to 3s recently. -Hold lisinopril for now - Stop gabapentin given worsening renal function     Hyperlipidemia - Continue atorvastatin   Essential hypertension Blood pressure elevated - Continue amlodipine, metoprolol, hydralazine - Continue IV Lasix - Hold ACE inhibitor for now given renal function   Type 2 diabetes mellitus with peripheral neuropathy (HCC) Glucose controlled here - Continue home glargine - Sliding scale correction insulin ordered    GERD - Continue PPI   Mood disorder - Continue escitalopram   Diabetic neuropathy - Hold gabapentin - Continue nortriptyline      Subjective: Still having some increased sob  Physical Exam: Vitals:   10/19/21 0658 10/19/21 0702 10/19/21 0846 10/19/21 1400  BP: (!) 153/81  133/79 124/73  Pulse: 77  69 64  Resp: '20  17 16  '$ Temp: 98.1 F (36.7 C)  98.6 F (37 C) 98.6 F (37 C)  TempSrc: Oral  Oral Oral  SpO2: (!) 87% 92% 92% 94%  Weight:  103.6 kg     General exam: Awake, laying in bed, in nad Respiratory system: Normal respiratory effort, no wheezing Cardiovascular system: regular rate, s1, s2 Gastrointestinal system: Soft, nondistended, positive BS Central nervous system: CN2-12 grossly intact, strength intact Extremities: Perfused, no clubbing Skin: Normal skin  turgor, no notable skin lesions seen Psychiatry: Mood normal // no visual hallucinations   Data Reviewed:  Labs reviewed: Na 138, K 3.5, Cr 3.56   Family Communication: Pt in room, family not at bedside  Disposition: Status is: Observation The patient will require care spanning > 2 midnights and should be moved to inpatient because: Severity of illness, needing IV lasix  Planned Discharge Destination:  Home     Author: Marylu Lund, MD 10/19/2021 2:54 PM  For on call review [email or web address].

## 2021-10-20 ENCOUNTER — Inpatient Hospital Stay (HOSPITAL_COMMUNITY): Payer: Medicaid Other

## 2021-10-20 ENCOUNTER — Encounter (HOSPITAL_COMMUNITY): Payer: Self-pay | Admitting: Internal Medicine

## 2021-10-20 LAB — COMPREHENSIVE METABOLIC PANEL
ALT: 7 U/L (ref 0–44)
AST: 12 U/L — ABNORMAL LOW (ref 15–41)
Albumin: 3.1 g/dL — ABNORMAL LOW (ref 3.5–5.0)
Alkaline Phosphatase: 91 U/L (ref 38–126)
Anion gap: 7 (ref 5–15)
BUN: 55 mg/dL — ABNORMAL HIGH (ref 8–23)
CO2: 22 mmol/L (ref 22–32)
Calcium: 8.4 mg/dL — ABNORMAL LOW (ref 8.9–10.3)
Chloride: 109 mmol/L (ref 98–111)
Creatinine, Ser: 4.14 mg/dL — ABNORMAL HIGH (ref 0.61–1.24)
GFR, Estimated: 16 mL/min — ABNORMAL LOW (ref >60)
Glucose, Bld: 62 mg/dL — ABNORMAL LOW (ref 70–99)
Potassium: 4.5 mmol/L (ref 3.5–5.1)
Sodium: 138 mmol/L (ref 135–145)
Total Bilirubin: 0.7 mg/dL (ref 0.3–1.2)
Total Protein: 7 g/dL (ref 6.5–8.1)

## 2021-10-20 LAB — CBC
HCT: 25.9 % — ABNORMAL LOW (ref 39.0–52.0)
Hemoglobin: 8.3 g/dL — ABNORMAL LOW (ref 13.0–17.0)
MCH: 29.4 pg (ref 26.0–34.0)
MCHC: 32 g/dL (ref 30.0–36.0)
MCV: 91.8 fL (ref 80.0–100.0)
Platelets: 328 10*3/uL (ref 150–400)
RBC: 2.82 MIL/uL — ABNORMAL LOW (ref 4.22–5.81)
RDW: 15.3 % (ref 11.5–15.5)
WBC: 7 10*3/uL (ref 4.0–10.5)
nRBC: 0 % (ref 0.0–0.2)

## 2021-10-20 LAB — GLUCOSE, CAPILLARY
Glucose-Capillary: 51 mg/dL — ABNORMAL LOW (ref 70–99)
Glucose-Capillary: 61 mg/dL — ABNORMAL LOW (ref 70–99)
Glucose-Capillary: 76 mg/dL (ref 70–99)
Glucose-Capillary: 105 mg/dL — ABNORMAL HIGH (ref 70–99)
Glucose-Capillary: 156 mg/dL — ABNORMAL HIGH (ref 70–99)
Glucose-Capillary: 114 mg/dL — ABNORMAL HIGH (ref 70–99)
Glucose-Capillary: 146 mg/dL — ABNORMAL HIGH (ref 70–99)

## 2021-10-20 MED ORDER — FINASTERIDE 5 MG PO TABS
5.0000 mg | ORAL_TABLET | Freq: Every day | ORAL | Status: DC
  Administered 2021-10-20 – 2021-10-22 (×3): 5 mg via ORAL
  Filled 2021-10-20 (×3): qty 1

## 2021-10-20 MED ORDER — INSULIN GLARGINE-YFGN 100 UNIT/ML ~~LOC~~ SOLN
15.0000 [IU] | Freq: Every day | SUBCUTANEOUS | Status: DC
  Administered 2021-10-20: 15 [IU] via SUBCUTANEOUS
  Filled 2021-10-20 (×2): qty 0.15

## 2021-10-20 MED ORDER — FUROSEMIDE 10 MG/ML IJ SOLN
80.0000 mg | Freq: Two times a day (BID) | INTRAMUSCULAR | Status: DC
  Administered 2021-10-20 – 2021-10-21 (×2): 80 mg via INTRAVENOUS
  Filled 2021-10-20 (×2): qty 8

## 2021-10-20 NOTE — TOC Initial Note (Signed)
Transition of Care [facility]) - Initial/Assessment Note    Patient Details  Name: Ryan Jenkins MRN: [id number] Date of Birth: 1960-09-30  Transition of Care [facility]) CM/SW Contact:    Leeroy Cha, RN Phone Number: 10/20/2021, 10:19 AM  Clinical Narrative:                  Transition of Care [facility]) Screening Note   Patient Details  Name: Ryan Jenkins Date of Birth: 08/20/1960   Transition of Care [facility]) CM/SW Contact:    Leeroy Cha, RN Phone Number: 10/20/2021, 10:19 AM    Transition of Care Department [facility]) has reviewed patient and no TOC needs have been identified at this time. We will continue to monitor patient advancement through interdisciplinary progression rounds. If new patient transition needs arise, please place a TOC consult.    Expected Discharge Plan: Home/Self Care Barriers to Discharge: Continued Medical Work up   Patient Goals and CMS Choice Patient states their goals for this hospitalization and ongoing recovery are:: to go home and ber well CMS Medicare.gov Compare Post Acute Care list provided to:: Patient Choice offered to / list presented to : Patient  Expected Discharge Plan and Services Expected Discharge Plan: Home/Self Care       Living arrangements for the past 2 months: Apartment                                      Prior Living Arrangements/Services Living arrangements for the past 2 months: Apartment Lives with:: Self Patient language and need for interpreter reviewed:: Yes Do you feel safe going back to the place where you live?: Yes            Criminal Activity/Legal Involvement Pertinent to Current Situation/Hospitalization: No - Comment as needed  Activities of Daily Living Home Assistive Devices/Equipment: Walker (specify type), CBG Meter, Eyeglasses ADL Screening (condition at time of admission) Patient's cognitive ability adequate to safely complete daily activities?: Yes Is the patient deaf or  have difficulty hearing?: No Does the patient have difficulty seeing, even when wearing glasses/contacts?: No Does the patient have difficulty concentrating, remembering, or making decisions?: No Patient able to express need for assistance with ADLs?: Yes Does the patient have difficulty dressing or bathing?: No Independently performs ADLs?: Yes (appropriate for developmental age) Does the patient have difficulty walking or climbing stairs?: No Weakness of Legs: None Weakness of Arms/Hands: None  Permission Sought/Granted                  Emotional Assessment Appearance:: Appears stated age Attitude/Demeanor/Rapport: Engaged Affect (typically observed): Calm Orientation: : Oriented to Self, Oriented to Place, Oriented to  Time, Oriented to Situation Alcohol / Substance Use: Never Used Psych Involvement: No (comment)  Admission diagnosis:  Bladder spasm [N32.89] Acute on chronic diastolic CHF (congestive heart failure) (HCC) [I50.33] Acute congestive heart failure, unspecified heart failure type ([place]) [I50.9] CHF exacerbation ([place]) [I50.9] Patient Active Problem List   Diagnosis Date Noted   CHF exacerbation ([place]) 10/19/2021   Hypokalemia 10/18/2021   Anemia in chronic kidney disease (CKD) 10/18/2021   BPH (benign prostatic hyperplasia) 10/16/2021   Severe sepsis ([place]) 06/05/2021   Coronary artery disease 02/07/2021   Acute renal failure superimposed on stage 4 chronic kidney disease ([place])    CKD (chronic kidney disease) stage 4, GFR 15-29 ml/min ([place]) 07/07/2020   Acute on chronic diastolic CHF (congestive  heart failure) ([place]) 07/07/2020   Erectile dysfunction associated with type 2 diabetes mellitus ([place]) 08/07/2019   Vitreous hemorrhage of left eye ([place]) 07/22/2019   Vision loss of left eye 07/22/2019   History of medication noncompliance 04/02/2019   Gastric ulcer without hemorrhage or perforation 12/25/2018   Gastroesophageal reflux disease without esophagitis  09/04/2018   Diabetic retinopathy of both eyes associated with type 2 diabetes mellitus ([place]) 01/03/2018   Intermittent diarrhea 09/27/2017   Gastroparesis 09/27/2017   Macroalbuminuric diabetic nephropathy ([place]) 06/25/2017   History of falling 06/25/2017   Hyperlipidemia 03/21/2017   Vitamin B 12 deficiency 03/21/2017   Type 2 diabetes mellitus with peripheral neuropathy ([place]) 02/05/2017   Essential hypertension 02/05/2017   Depression 02/05/2017   Unintended weight loss 02/05/2017   Gait disturbance 02/05/2017   Pronation deformity of both feet 05/11/2014   Diabetic neuropathy, type II diabetes mellitus ([place]) 05/11/2014   Metatarsal deformity 05/11/2014   PCP:  [name], MD Pharmacy:   [facility] [place], [place] - [address] AT [place] [address] Phone: [phone] Fax: [phone]  [id number] [place] [address] [id number]: [phone]: [phone]    Social Determinants of Health (SDOH) Interventions    Readmission Risk Interventions   No data to display

## 2021-10-20 NOTE — Progress Notes (Signed)
Date and time results received: 10/20/21 0730 (use smartphrase ".now" to insert current time)  Test: CBG  Critical Value: 50  Name of Provider Notified: Marylu Lund, MD  Orders Received? Or Actions Taken?: YES

## 2021-10-20 NOTE — Consult Note (Signed)
Renal Service Consult Note [facility]  SHLOIME KEILMAN 10/20/2021 Sol Blazing, MD Requesting Physician: Dr. Wyline Copas  Reason for Consult: Renal failure HPI: The patient is a 61 y.o. year-old w/ hx of anemia, anixety, CAD, combined CHF, CKD IV, depression, DM2, HL, HTN, sp TURP who presented to ED on 10/04 w/ leg swelling and SOB. Just had TURP done on 10/2. In ED BNP >1000, CXR w/ edema. Given IV lasix and admitted. Diuresed 1000 cc yest and 900 cc so far today. Wts up to 105 kg from 103kg today. Creat was 3.5 on admit yest and is up to 4.1 today. Asked to see for renal faliure.   IV lasix was given 68m x 1, then 869mx 3 then dc'd.   Pt seen in the room. Says leg swelling is better, not sure about SOB, maybe a little better.  [facility] but just a bit. He had TURP and still has the foley catheter in.   ROS - denies CP, no joint pain, no HA, no blurry vision, no rash, no diarrhea, no nausea/ vomiting, no dysuria, no difficulty voiding   Past Medical History  Past Medical History:  Diagnosis Date   Anemia    Anginal pain (HCC)    Anxiety    Arthritis    BPH with obstruction/lower urinary tract symptoms    CAD (coronary artery disease)    cardiologist--- dr maJunius Roadsadal;  [place]  nonobstructive cad involing pRCA 60% (done in setting worseing CHF/ acute pulmonary edema requiring intubation/ AKI   Chronic combined systolic and diastolic CHF (congestive heart failure) ([place]   Chronic kidney disease, stage IV (severe) ([place]   nephrologist--- dr bhCarolin Sicks Depression    Diabetic neuropathy ([place]   Dyspnea    Edema of both lower extremities    GERD (gastroesophageal reflux disease)    Heart murmur    History of community acquired pneumonia    ad[facility]-2021 [facility]  w/ ARF hypoxia w/ severe sepsis   Hyperlipidemia    Hypertension    Insulin dependent  type 2 diabetes mellitus ([place]   Pneumonia    Retinopathy due to secondary diabetes ([place]   Uses walker    Vitamin B12 deficiency    [facility]   Past Surgical History  Past Surgical History:  Procedure Laterality Date   CARDIAC CATHE[place]IZ[place]  [place]   nonobstructive CAD , pRCA 60% (result in care everywhere)   CATARACT EXTRACTION W/ INTRAOCULAR LENS IMPLANT Bilateral 2017   TRANSURETHRAL RESECTION OF PROSTATE N/A 10/16/2021   Procedure: TRANSURETHRAL RESECTION OF THE PROSTATE (TURP);  Surgeon: GaJanith LimaMD;  Location: [facility];  Service: Urology;  Laterality: N/A;   Family History  Family History  Problem Relation Age of Onset   Renal cancer Mother    Hypertension Mother    Pancreatic cancer Mother    Hypertension Sister    Stroke Sister    Leukemia Maternal Uncle    Sickle cell trait Maternal Aunt    Colon cancer Neg Hx    Esophageal cancer Neg Hx    Rectal cancer Neg Hx    Stomach cancer Neg Hx    Social History  reports that he has never smoked. He has never used smokeless tobacco. He reports that he does not  drink alcohol and does not use drugs. Allergies  Allergies  Allergen Reactions   Claritin [Loratadine] Swelling    Joint swelling    Hydrochlorothiazide Other (See Comments)    Dizziness   Latex Hives   Lyrica [Pregabalin] Other (See Comments)    depression   Metformin And Related Other (See Comments)    GI   Home medications Prior to Admission medications   Medication Sig Start Date End Date Taking? Authorizing Provider  acetaminophen (TYLENOL) 500 MG tablet Take 1,000 mg by mouth every 6 (six) hours as needed for mild pain or headache.   Yes [provider]  amLODipine (NORVASC) 10 MG tablet Take 1 tablet (10 mg total) by mouth daily. 02/08/21  Yes Danford, Suann Larry, MD  atorvastatin (LIPITOR) 40 MG tablet Take 1 tablet (40 mg total) by mouth daily. 02/08/21  Yes Danford,  Suann Larry, MD  cephALEXin (KEFLEX) 500 MG capsule Take 1 capsule (500 mg total) by mouth 2 (two) times daily for 5 days. 10/16/21 10/21/21 Yes Janith Lima, MD  Cholecalciferol (VITAMIN D3) 1.25 MG (50000 UT) CAPS Take 50,000 Units by mouth once a week. 09/12/21  Yes [provider]  clopidogrel (PLAVIX) 75 MG tablet Take 1 tablet (75 mg total) by mouth daily. 07/05/21  Yes de Yolanda Manges, Cortney E, NP  clotrimazole (LOTRIMIN) 1 % cream Apply 1 Application topically 2 (two) times daily as needed (rash). 08/01/21  Yes [provider]  D-Mannose 350 MG CAPS Take 1,050 capsules by mouth in the morning and at bedtime.   Yes [provider]  docusate sodium (COLACE) 100 MG capsule Take 1 capsule (100 mg total) by mouth daily as needed for up to 30 doses. 10/16/21  Yes Janith Lima, MD  escitalopram (LEXAPRO) 20 MG tablet Take 1 tablet (20 mg total) by mouth daily. 07/20/20  Yes Charlott Rakes, MD  famotidine (PEPCID) 40 MG tablet Take 40 mg by mouth daily. 08/08/21  Yes [provider]  finasteride (PROSCAR) 5 MG tablet Take 1 tablet (5 mg total) by mouth daily. 07/20/20  Yes Charlott Rakes, MD  furosemide (LASIX) 40 MG tablet Take 1 tablet (40 mg total) by mouth 2 (two) times daily. 02/08/21  Yes Danford, Suann Larry, MD  gabapentin (NEURONTIN) 300 MG capsule Take 300 mg by mouth 3 (three) times daily. 05/26/21  Yes [provider]  hydrALAZINE (APRESOLINE) 100 MG tablet Take 1 tablet (100 mg total) by mouth 3 (three) times daily. 02/08/21  Yes Danford, Suann Larry, MD  LANTUS SOLOSTAR 100 UNIT/ML Solostar Pen Inject 25 Units into the skin at bedtime. Patient taking differently: Inject 30 Units into the skin at bedtime. 07/05/21  Yes de Yolanda Manges, Cortney E, NP  lisinopril (ZESTRIL) 10 MG tablet Take 10 mg by mouth daily. 04/24/21  Yes [provider]  meclizine (ANTIVERT) 25 MG tablet Take 25 mg by mouth 3 (three) times daily as needed for dizziness.  08/08/21  Yes [provider]  metoCLOPramide (REGLAN) 10 MG tablet Take 0.5 tablets (5 mg total) by mouth every 8 (eight) hours as needed for nausea or vomiting (Or headache). 07/09/21  Yes Godfrey Pick, MD  metoprolol succinate (TOPROL-XL) 50 MG 24 hr tablet Take 1 tablet (50 mg total) by mouth at bedtime. Take with or immediately following a meal. 02/08/21  Yes Danford, Suann Larry, MD  nortriptyline (PAMELOR) 25 MG capsule Take 1 capsule (25 mg total) by mouth at bedtime. 07/20/20  Yes Charlott Rakes, MD  oxyCODONE-acetaminophen (PERCOCET) 5-325 MG tablet Take 1 tablet by mouth every 4 (four) hours as needed for up to 18 doses for severe pain. 10/16/21  Yes Janith Lima, MD  pantoprazole (PROTONIX) 40 MG tablet Take 1 tablet (40 mg total) by mouth daily. 02/08/21  Yes Danford, Suann Larry, MD  potassium chloride (KLOR-CON M) 10 MEQ tablet Take 1 tablet (10 mEq total) by mouth daily. 02/08/21  Yes Danford, Suann Larry, MD  sodium bicarbonate 650 MG tablet Take 650 mg by mouth 2 (two) times daily. 09/20/21  Yes [provider]  tamsulosin (FLOMAX) 0.4 MG CAPS capsule Take 0.4 mg by mouth at bedtime. 05/25/21  Yes [provider]  traZODone (DESYREL) 50 MG tablet Take 3 tablets (150 mg total) by mouth at bedtime. Patient taking differently: Take 50 mg by mouth at bedtime as needed for sleep. 07/20/20  Yes Charlott Rakes, MD  triamcinolone ointment (KENALOG) 0.5 % Apply 1 Application topically 2 (two) times daily as needed (rash). 08/31/21  Yes [provider]  Blood Glucose Monitoring Suppl (TRUE METRIX METER) w/Device KIT Use as directed 07/20/20   Ladell Pier, MD  Blood Pressure Monitor DEVI Use as directed to check home blood pressure 2-3 times a week 07/13/20   Camillia Herter, NP  glucose blood (TRUE METRIX BLOOD GLUCOSE TEST) test strip Use as instructed 07/20/20   Ladell Pier, MD  Insulin Syringe-Needle U-100 (TRUEPLUS INSULIN SYRINGE) 31G X 5/16" 0.3 ML  MISC Use to inject Levemir at bedtime. 07/20/20   Charlott Rakes, MD  TRUEplus Lancets 28G MISC Use as directed 07/20/20   Ladell Pier, MD     Vitals:   10/19/21 2129 10/20/21 0518 10/20/21 0521 10/20/21 1506  BP: (!) 150/74  (!) 141/77 (!) 140/74  Pulse: 74  75 72  Resp: _0 Temp: 98.5 F (36.9 C)  99.3 F (37.4 C) 98.4 F (36.9 C)  TempSrc: Oral  Oral Oral  SpO2: 93%  94% 94%  Weight:  105.6 kg    Height:       Exam Gen alert, no distress No rash, cyanosis or gangrene Sclera anicteric, throat clear  No jvd or bruits Chest occ basilar crackles, mostly clear RRR no MRG Abd soft ntnd no mass or ascites +bs GU normal male w/ foley cath in MS no joint effusions or deformity Ext no LE or UE edema, no wounds or ulcers Neuro is alert, Ox 3 , nf   Home meds include - amlodipine 10, atorvastatin, clopidogrel, escitalopram, famotidine, finasteride, lasix 40 bid, gabapentin, hydralazine 100 tid, insulin lantus, lisinopril 10 qd, metoprolol 50 xl qd, nortriptyline, percocet prn, protonix, klorcon, sod bicarb, tamsulosin, trazodone, prns/ vits/ supps     Date  Creat  eGFR   2022   2.5- 2.23 Jan 2021 2.2- 3.19 May 2021 5.0 >> 3.22 June 2021 2.52- 2.80 27- 9m/min, CKD IV   Sept 2023 3.08  22 ml/min   10/2  3.26  21 ml/min    10/3  3.49     10/4  3.85      10/5 3.56      10/ 6 4.14  16 ml/min      CXR 10/4 - IMPRESSION: Accentuated perihilar markings and RIGHT basilar Kerley B-lines, question minimal pulmonary edema.    CXR 10/6 - IMPRESSION: Increased mild pulmonary edema. New small left pleural effusion.       BP 140  75, HR 70s , RR 14-23  , afeb     Na 138  K 4.5  CO2 22  BUN 5  creat 4.14  Alb 3.1  HB 8.3  WBC 7  Assessment/ Plan: AKI on CKD 4 - b/l creat 2.5- 2.8 from June 2023. Creat here 3.5 yest and 4.1 today in setting of pulm edema/ SOB getting IV lasix. Has had a good response, but still wet on CXR today. Will resume IV lasix 11m bid. Also get  renal [place]. BP's have not dropped so BP lowering meds can be continued except ACEi which is on hold appropriately. No uremic signs/ symtoms. Have d/w pt and family. Will follow.  AHRF - due to vol overload from advanced CKD and /or diast HF. As above.  SP TURP - done 10/02, w/ foley in place Anemia - Hb 8s, follow CAD - stable DM2 - on insulin      Rob Dnyla Antonetti  MD 10/20/2021, 4:41 PM Recent Labs  Lab 10/18/21 0737 10/19/21 0623 10/20/21 0646  HGB 9.1* 8.5* 8.3*  ALBUMIN 3.4*  --  3.1*  CALCIUM 8.2* 8.2* 8.4*  CREATININE 3.85* 3.56* 4.14*  K 3.3* 3.5 4.5   Inpatient medications:  amLODipine  10 mg Oral Daily   atorvastatin  40 mg Oral Daily   cephALEXin  500 mg Oral BID   Chlorhexidine Gluconate Cloth  6 each Topical Daily   enoxaparin (LOVENOX) injection  30 mg Subcutaneous Q24H   escitalopram  20 mg Oral Daily   finasteride  5 mg Oral Daily   hydrALAZINE  100 mg Oral TID   insulin aspart  0-15 Units Subcutaneous TID WC   insulin aspart  0-5 Units Subcutaneous QHS   insulin glargine-yfgn  15 Units Subcutaneous QHS   metoprolol succinate  50 mg Oral QHS   nortriptyline  25 mg Oral QHS   pantoprazole  40 mg Oral Daily   potassium chloride  40 mEq Oral BID   tamsulosin  0.4 mg Oral QHS    acetaminophen **OR** acetaminophen, ondansetron **OR** ondansetron (ZOFRAN) IV, oxyCODONE-acetaminophen, traZODone

## 2021-10-20 NOTE — Progress Notes (Signed)
  Subjective: Pt admitted with acute on chronic CHF. Denies pain. Denies CP, SOB. Tolerating foley. Foley draining clear yellow urine.  Objective: Vital signs in last 24 hours: Temp:  [98.4 F (36.9 C)-99.3 F (37.4 C)] 98.4 F (36.9 C) (10/06 1506) Pulse Rate:  [72-75] 72 (10/06 1506) Resp:  [17-19] 19 (10/06 1506) BP: (140-150)/(74-77) 140/74 (10/06 1506) SpO2:  [93 %-94 %] 94 % (10/06 1506) Weight:  [105.6 kg] 105.6 kg (10/06 0518)  Intake/Output from previous day: 10/05 0701 - 10/06 0700 In: -  Out: 8469 [Urine:1650] Intake/Output this shift: Total I/O In: 480 [P.O.:480] Out: 800 [Urine:800]  Physical Exam:  General: Alert and oriented CV: RRR Lungs: Clear Abdomen: Soft, ND, NT Ext: NT, No erythema  Lab Results: Recent Labs    10/18/21 0737 10/19/21 0623 10/20/21 0646  HGB 9.1* 8.5* 8.3*  HCT 28.6* 26.3* 25.9*   BMET Recent Labs    10/19/21 0623 10/20/21 0646  NA 138 138  K 3.5 4.5  CL 108 109  CO2 21* 22  GLUCOSE 77 62*  BUN 51* 55*  CREATININE 3.56* 4.14*  CALCIUM 8.2* 8.4*     Studies/Results: DG CHEST PORT 1 VIEW  Result Date: 10/20/2021 CLINICAL DATA:  Congestive heart failure EXAM: PORTABLE CHEST 1 VIEW COMPARISON:  Chest x-ray dated October 18, 2021 FINDINGS: Unchanged heart size. Mild bilateral perihilar predominant heterogeneous opacities, increased when compared to prior exam. New small left pleural effusion. No evidence of pneumothorax. IMPRESSION: 1. Increased mild pulmonary edema. 2. New small left pleural effusion. Electronically Signed   By: Yetta Glassman M.D.   On: 10/20/2021 14:21    Assessment/Plan: BPH s/p TURP 10/16/2021  -Plan for void trial tomorrow AM. Ordered. If fails, will plan to replace 18 Fr foley catheter.  -Appreciate assistance of IM service to manage his CHF   LOS: 1 day   Matt R. Mahalia Dykes MD 10/20/2021, 6:39 PM [facility]  Pager: [phone]

## 2021-10-20 NOTE — Plan of Care (Signed)
  Problem: Education: Goal: Knowledge of the prescribed therapeutic regimen will improve Outcome: Progressing   Problem: Bowel/Gastric: Goal: Gastrointestinal status for postoperative course will improve Outcome: Progressing   Problem: Health Behavior/Discharge Planning: Goal: Identification of resources available to assist in meeting health care needs will improve Outcome: Progressing   Problem: Skin Integrity: Goal: Demonstration of wound healing without infection will improve Outcome: Progressing   Problem: Urinary Elimination: Goal: Ability to avoid or minimize complications of infection will improve Outcome: Progressing   Problem: Education: Goal: Ability to demonstrate management of disease process will improve Outcome: Progressing Goal: Ability to verbalize understanding of medication therapies will improve Outcome: Progressing Goal: Individualized Educational Video(s) Outcome: Progressing   Problem: Activity: Goal: Capacity to carry out activities will improve Outcome: Progressing   Problem: Cardiac: Goal: Ability to achieve and maintain adequate cardiopulmonary perfusion will improve Outcome: Progressing   Problem: Coping: Goal: Ability to adjust to condition or change in health will improve Outcome: Progressing   Problem: Health Behavior/Discharge Planning: Goal: Ability to identify and utilize available resources and services will improve Outcome: Progressing Goal: Ability to manage health-related needs will improve Outcome: Progressing   Problem: Metabolic: Goal: Ability to maintain appropriate glucose levels will improve Outcome: Progressing   Problem: Nutritional: Goal: Maintenance of adequate nutrition will improve Outcome: Progressing Goal: Progress toward achieving an optimal weight will improve Outcome: Progressing   Problem: Skin Integrity: Goal: Risk for impaired skin integrity will decrease Outcome: Progressing   Problem: Tissue  Perfusion: Goal: Adequacy of tissue perfusion will improve Outcome: Progressing   Problem: Education: Goal: Knowledge of General Education information will improve Description: Including pain rating scale, medication(s)/side effects and non-pharmacologic comfort measures Outcome: Progressing   Problem: Clinical Measurements: Goal: Ability to maintain clinical measurements within normal limits will improve Outcome: Progressing Goal: Will remain free from infection Outcome: Progressing Goal: Diagnostic test results will improve Outcome: Progressing Goal: Respiratory complications will improve Outcome: Progressing Goal: Cardiovascular complication will be avoided Outcome: Progressing   Problem: Pain Managment: Goal: General experience of comfort will improve Outcome: Progressing   Problem: Safety: Goal: Ability to remain free from injury will improve Outcome: Progressing

## 2021-10-20 NOTE — Inpatient Diabetes Management (Signed)
Inpatient Diabetes Program Recommendations  AACE/ADA: New Consensus Statement on Inpatient Glycemic Control (2015)  Target Ranges:  Prepandial:   less than 140 mg/dL      Peak postprandial:   less than 180 mg/dL (1-2 hours)      Critically ill patients:  140 - 180 mg/dL   Lab Results  Component Value Date   GLUCAP 105 (H) 10/20/2021   HGBA1C 8.4 (H) 10/10/2021    Review of Glycemic Control  Latest Reference Range & Units 10/20/21 07:30 10/20/21 07:55 10/20/21 08:17 10/20/21 09:42  Glucose-Capillary 70 - 99 mg/dL 51 (L) 61 (L) 76 105 (H)  (L): Data is abnormally low  Diabetes history: Type 2 DM Outpatient Diabetes medications: Lantus 30 units QHS Current orders for Inpatient glycemic control: Semglee 15 units QHS, Novolog 0-15 units TID & HS  Inpatient Diabetes Program Recommendations:    Noted hypoglycemia this AM and subsequent insulin adjustments. In agreement.  Could also consider decreasing correction to Novolog 0-6 units TID given current renal status.   Thanks, Bronson Curb, MSN, RNC-OB Diabetes Coordinator [phone] (8a-5p)

## 2021-10-20 NOTE — Progress Notes (Signed)
Mobility Specialist - Progress Note   10/20/21 1057  Mobility  Activity Ambulated with assistance in hallway  Activity Response Tolerated well  Distance Ambulated (ft) 120 ft  $Mobility charge 1 Mobility  Level of Assistance Contact guard assist, steadying assist  [place] wheel walker  Mobility Referral Yes   Pt received in bed and agreed for mobility, no c/o pain nor discomfort. Some fatigue. Pt to chair with all needs met.   Roderick Pee Mobility Specialist

## 2021-10-20 NOTE — Consult Note (Signed)
Cardiology Consultation   Patient ID: Ryan Jenkins MRN: [id number]; DOB: [birth year]  Admit date: 10/18/2021 Date of Consult: 10/20/2021  PCP:  [name], MD   [place] Providers Cardiologist:  None   Dr Carollee Herter, [facility] in [place]     Patient Profile:   Ryan Jenkins is a 61 y.o. male with a hx of poorly controlled diabetes, HTN, HLD, CKD IV, HFpEF, diabetic polyneuropathy, Hx acute hypoxic respiratory failure with ARDS 10/2019 who is being seen 10/20/2021 for the evaluation of CHF at the request of Dr. Wyline Copas.  History of Present Illness:   Mr. Ryan Jenkins moved here from [place], and was not taking any meds when he was hospitalized 10/2019.  He had ARDS, pulmonary edema, acute hypoxic respiratory failure.  A cath showed nonobstructive RCA disease (60).  His EF was normal.  He has been followed by cardiology and internal medicine since then.  He had a TURP on 10/2.  He felt that his breathing was at baseline on discharge.  He did have some Hematuria and bladder spasms, but these were considered normal after the procedure.  He presented to the ER 10/18/2021 with swelling, increased shortness of breath.  His chest x-ray showed edema and his BNP was greater than thousand.  O2 sats were in the 80s.  He was admitted for CHF exacerbation and cardiology was asked to evaluate.  Patient states that his symptoms have improved since admission but he continues to describe dyspnea.  His pedal edema has resolved.  He has chest tightness that increases with lying flat.   Past Medical History:  Diagnosis Date   Anemia    Anginal pain ([place])    Anxiety    Arthritis    BPH with obstruction/lower urinary tract symptoms    CAD (coronary artery disease)    cardiologist--- dr Junius Roads badal;  11-06-2019 cardiac cath in [place] (result in care everywhere)  nonobstructive cad involing pRCA 60% (done in setting worseing CHF/ acute pulmonary edema requiring intubation/ AKI   Chronic  combined systolic and diastolic CHF (congestive heart failure) ([place])    Chronic kidney disease, stage IV (severe) ([place])    nephrologist--- dr Carolin Sicks   Depression    Diabetic neuropathy ([place])    Dyspnea    Edema of both lower extremities    GERD (gastroesophageal reflux disease)    Heart murmur    History of community acquired pneumonia    admission 06-04-2021 in [facility]  w/ ARF hypoxia w/ severe sepsis   Hyperlipidemia    Hypertension    Insulin dependent type 2 diabetes mellitus ([place])    Pneumonia    Retinopathy due to secondary diabetes ([place])    Uses walker    Vitamin B12 deficiency    Vitreous hemorrhage [facility])     Past Surgical History:  Procedure Laterality Date   CARDIAC CATHETERIZATION  11/06/2019   [place] in [place];    nonobstructive CAD , pRCA 60% (result in care everywhere)   CATARACT EXTRACTION W/ INTRAOCULAR LENS IMPLANT Bilateral 2017   TRANSURETHRAL RESECTION OF PROSTATE N/A 10/16/2021   Procedure: TRANSURETHRAL RESECTION OF THE PROSTATE (TURP);  Surgeon: Janith Lima, MD;  Location: [facility];  Service: Urology;  Laterality: N/A;     Home Medications:  Prior to Admission medications   Medication Sig Start Date End Date Taking? Authorizing Provider  acetaminophen (TYLENOL) 500 MG tablet Take 1,000 mg by mouth every 6 (six) hours as needed for mild pain or  headache.   Yes [provider]  amLODipine (NORVASC) 10 MG tablet Take 1 tablet (10 mg total) by mouth daily. 02/08/21  Yes Danford, Suann Larry, MD  atorvastatin (LIPITOR) 40 MG tablet Take 1 tablet (40 mg total) by mouth daily. 02/08/21  Yes Danford, Suann Larry, MD  cephALEXin (KEFLEX) 500 MG capsule Take 1 capsule (500 mg total) by mouth 2 (two) times daily for 5 days. 10/16/21 10/21/21 Yes Janith Lima, MD  Cholecalciferol (VITAMIN D3) 1.25 MG (50000 UT) CAPS Take 50,000 Units by mouth once a week. 09/12/21  Yes [provider]  clopidogrel (PLAVIX) 75 MG tablet Take 1  tablet (75 mg total) by mouth daily. 07/05/21  Yes de Yolanda Manges, Cortney E, NP  clotrimazole (LOTRIMIN) 1 % cream Apply 1 Application topically 2 (two) times daily as needed (rash). 08/01/21  Yes [provider]  D-Mannose 350 MG CAPS Take 1,050 capsules by mouth in the morning and at bedtime.   Yes [provider]  docusate sodium (COLACE) 100 MG capsule Take 1 capsule (100 mg total) by mouth daily as needed for up to 30 doses. 10/16/21  Yes Janith Lima, MD  escitalopram (LEXAPRO) 20 MG tablet Take 1 tablet (20 mg total) by mouth daily. 07/20/20  Yes Charlott Rakes, MD  famotidine (PEPCID) 40 MG tablet Take 40 mg by mouth daily. 08/08/21  Yes [provider]  finasteride (PROSCAR) 5 MG tablet Take 1 tablet (5 mg total) by mouth daily. 07/20/20  Yes Charlott Rakes, MD  furosemide (LASIX) 40 MG tablet Take 1 tablet (40 mg total) by mouth 2 (two) times daily. 02/08/21  Yes Danford, Suann Larry, MD  gabapentin (NEURONTIN) 300 MG capsule Take 300 mg by mouth 3 (three) times daily. 05/26/21  Yes [provider]  hydrALAZINE (APRESOLINE) 100 MG tablet Take 1 tablet (100 mg total) by mouth 3 (three) times daily. 02/08/21  Yes Danford, Suann Larry, MD  LANTUS SOLOSTAR 100 UNIT/ML Solostar Pen Inject 25 Units into the skin at bedtime. Patient taking differently: Inject 30 Units into the skin at bedtime. 07/05/21  Yes de Yolanda Manges, Cortney E, NP  lisinopril (ZESTRIL) 10 MG tablet Take 10 mg by mouth daily. 04/24/21  Yes [provider]  meclizine (ANTIVERT) 25 MG tablet Take 25 mg by mouth 3 (three) times daily as needed for dizziness. 08/08/21  Yes [provider]  metoCLOPramide (REGLAN) 10 MG tablet Take 0.5 tablets (5 mg total) by mouth every 8 (eight) hours as needed for nausea or vomiting (Or headache). 07/09/21  Yes Godfrey Pick, MD  metoprolol succinate (TOPROL-XL) 50 MG 24 hr tablet Take 1 tablet (50 mg total) by mouth at bedtime. Take with or immediately  following a meal. 02/08/21  Yes Danford, Suann Larry, MD  nortriptyline (PAMELOR) 25 MG capsule Take 1 capsule (25 mg total) by mouth at bedtime. 07/20/20  Yes Charlott Rakes, MD  oxyCODONE-acetaminophen (PERCOCET) 5-325 MG tablet Take 1 tablet by mouth every 4 (four) hours as needed for up to 18 doses for severe pain. 10/16/21  Yes Janith Lima, MD  pantoprazole (PROTONIX) 40 MG tablet Take 1 tablet (40 mg total) by mouth daily. 02/08/21  Yes Danford, Suann Larry, MD  potassium chloride (KLOR-CON M) 10 MEQ tablet Take 1 tablet (10 mEq total) by mouth daily. 02/08/21  Yes Danford, Suann Larry, MD  sodium bicarbonate 650 MG tablet Take 650 mg by mouth 2 (two) times daily. 09/20/21  Yes [provider]  tamsulosin (  FLOMAX) 0.4 MG CAPS capsule Take 0.4 mg by mouth at bedtime. 05/25/21  Yes [provider]  traZODone (DESYREL) 50 MG tablet Take 3 tablets (150 mg total) by mouth at bedtime. Patient taking differently: Take 50 mg by mouth at bedtime as needed for sleep. 07/20/20  Yes Charlott Rakes, MD  triamcinolone ointment (KENALOG) 0.5 % Apply 1 Application topically 2 (two) times daily as needed (rash). 08/31/21  Yes [provider]  Blood Glucose Monitoring Suppl (TRUE METRIX METER) w/Device KIT Use as directed 07/20/20   Ladell Pier, MD  Blood Pressure Monitor DEVI Use as directed to check home blood pressure 2-3 times a week 07/13/20   Camillia Herter, NP  glucose blood (TRUE METRIX BLOOD GLUCOSE TEST) test strip Use as instructed 07/20/20   Ladell Pier, MD  Insulin Syringe-Needle U-100 (TRUEPLUS INSULIN SYRINGE) 31G X 5/16" 0.3 ML MISC Use to inject Levemir at bedtime. 07/20/20   Charlott Rakes, MD  TRUEplus Lancets 28G MISC Use as directed 07/20/20   Ladell Pier, MD    Inpatient Medications: Scheduled Meds:  amLODipine  10 mg Oral Daily   atorvastatin  40 mg Oral Daily   cephALEXin  500 mg Oral BID   Chlorhexidine Gluconate Cloth  6 each Topical Daily    enoxaparin (LOVENOX) injection  30 mg Subcutaneous Q24H   escitalopram  20 mg Oral Daily   finasteride  5 mg Oral Daily   hydrALAZINE  100 mg Oral TID   influenza vac split quadrivalent PF  0.5 mL Intramuscular Tomorrow-1000   insulin aspart  0-15 Units Subcutaneous TID WC   insulin aspart  0-5 Units Subcutaneous QHS   insulin glargine-yfgn  15 Units Subcutaneous QHS   metoprolol succinate  50 mg Oral QHS   nortriptyline  25 mg Oral QHS   pantoprazole  40 mg Oral Daily   potassium chloride  40 mEq Oral BID   tamsulosin  0.4 mg Oral QHS   Continuous Infusions:  PRN Meds: acetaminophen **OR** acetaminophen, ondansetron **OR** ondansetron (ZOFRAN) IV, oxyCODONE-acetaminophen, traZODone  Allergies:    Allergies  Allergen Reactions   Claritin [Loratadine] Swelling    Joint swelling    Hydrochlorothiazide Other (See Comments)    Dizziness   Latex Hives   Lyrica [Pregabalin] Other (See Comments)    depression   Metformin And Related Other (See Comments)    GI    Social History:   Social History   Socioeconomic History   Marital status: Divorced    Spouse name: Not on file   Number of children: 1   Years of education: 12   Highest education level: Not on file  Occupational History   Occupation: unemployed    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, Editor, commissioning  Tobacco Use   Smoking status: Never   Smokeless tobacco: Never  Vaping Use   Vaping Use: Never used  Substance and Sexual Activity   Alcohol use: No    Alcohol/week: 0.0 standard drinks of alcohol   Drug use: No   Sexual activity: Yes  Other Topics Concern   Not on file  Social History Narrative   Not on file   Social Determinants of Health   Financial Resource Strain: Not on file  Food Insecurity: No Food Insecurity (10/18/2021)   Hunger Vital Sign    Worried About Running Out of Food in the Last Year: Never true    Ran Out of Food in the Last Year: Never true  Transportation Needs: No  Transportation Needs (10/18/2021)    PRAPARE - Hydrologist (Medical): No    Lack of Transportation (Non-Medical): No  Physical Activity: Not on file  Stress: Not on file  Social Connections: Not on file  Intimate Partner Violence: Not At Risk (10/18/2021)   Humiliation, Afraid, Rape, and Kick questionnaire    Fear of Current or Ex-Partner: No    Emotionally Abused: No    Physically Abused: No    Sexually Abused: No    Family History:   Family History  Problem Relation Age of Onset   Renal cancer Mother    Hypertension Mother    Pancreatic cancer Mother    Hypertension Sister    Stroke Sister    Leukemia Maternal Uncle    Sickle cell trait Maternal Aunt    Colon cancer Neg Hx    Esophageal cancer Neg Hx    Rectal cancer Neg Hx    Stomach cancer Neg Hx      ROS:  Please see the history of present illness.  All other ROS reviewed and negative.     Physical Exam/Data:   Vitals:   10/19/21 1610 10/19/21 2129 10/20/21 0518 10/20/21 0521  BP:  (!) 150/74  (!) 141/77  Pulse:  74  75  Resp:  18  17  Temp:  98.5 F (36.9 C)  99.3 F (37.4 C)  TempSrc:  Oral  Oral  SpO2:  93%  94%  Weight: 103.6 kg  105.6 kg   Height: 6' 2"  (1.88 m)       Intake/Output Summary (Last 24 hours) at 10/20/2021 0920 Last data filed at 10/20/2021 0519 Gross per 24 hour  Intake --  Output 1650 ml  Net -1650 ml      10/20/2021    5:18 AM 10/19/2021    4:10 PM 10/19/2021    7:02 AM  Last 3 Weights  Weight (lbs) 232 lb 12.9 oz 228 lb 6.3 oz 228 lb 6.4 oz  Weight (kg) 105.6 kg 103.6 kg 103.602 kg     Body mass index is 29.89 kg/m.  General:  Well nourished, well developed, in no acute distress HEENT: normal Neck: no JVD Vascular: No carotid bruits; Distal pulses 2+ bilaterally Cardiac:  normal S1, S2; RRR; no murmur  Lungs: Diminished breath sounds bases Abd: soft, nontender, no hepatomegaly  Ext: no edema Musculoskeletal:  No deformities, BUE and BLE strength normal and equal Skin: warm and  dry  Neuro:  CNs 2-12 intact, no focal abnormalities noted Psych:  Normal affect   EKG:  The EKG was personally reviewed and demonstrates:  SR, HR 74, QT/QTc 452/502  Telemetry:  Telemetry was personally reviewed and demonstrates: Sinus rhythm  Relevant CV Studies:  ECHO: 06/05/21  1. Left ventricular ejection fraction, by estimation, is 60 to 65%. The left ventricle has normal function. The left ventricle has no regional wall motion abnormalities. There is moderate concentric left ventricular hypertrophy. Left ventricular diastolic parameters are consistent with Grade II diastolic dysfunction  (pseudonormalization).   2. Right ventricular systolic function is normal. The right ventricular size is normal.   3. The mitral valve is normal in structure. Trivial mitral valve  regurgitation.   4. The aortic valve is tricuspid. There is mild calcification of the  aortic valve. There is mild thickening of the aortic valve. Aortic valve regurgitation is not visualized. Aortic valve sclerosis/calcification is present, without any evidence of  aortic stenosis.   5. The inferior vena  cava is normal in size with greater than 50%  respiratory variability, suggesting right atrial pressure of 3 mmHg.   Comparison(s): Compared to prior TTE in 01/2021, there is no significant  change.   ECHO: 01/27/2021 Left Ventricle: Left ventricle size is normal.There is moderate  concentric hypertrophy.    Left Ventricle: Systolic function is normal. EF: 60-65%. Doppler parameters consistent with mild diastolic dysfunction and low to normal LA  pressure.    Left Atrium: Left atrium is mildly dilated.    Right Ventricle: Right ventricle is normal.Systolic function is normal.    No major valvular abnormalities. Left Ventricle  Left ventricle size is normal. There is moderate concentric hypertrophy. Systolic function is normal. EF: 60-65%. Wall motion is normal. Doppler parameters consistent with mild diastolic  dysfunction and low to normal LA pressure.   Right Ventricle  Right ventricle is normal. Systolic function is normal.   Left Atrium  Left atrium is mildly dilated.   Right Atrium  Normal sized right atrium.   IVC/SVC  The inferior vena cava demonstrates a diameter of <=2.1 cm and collapses >50%; therefore, the right atrial pressure is estimated at 3 mmHg.   Mitral Valve  Mitral valve structure is normal. There is trace regurgitation.   Tricuspid Valve  Tricuspid valve structure is normal. There is trace regurgitation. The right ventricular systolic pressure is normal (<36 mmHg).   Aortic Valve  The aortic valve is tricuspid. The leaflets are mildly thickened and exhibit normal excursion. The cusps are perforated. There is mild sclerosis. There is no regurgitation or stenosis.   Pulmonic Valve  The pulmonic valve was not well visualized. Trace regurgitation.   Ascending Aorta  The aortic root is normal in size.   Pericardium  There is no pericardial effusion.   Study Details  A complete echo was performed using complete 2D, color flow Doppler and spectral Doppler. Overall the study quality was adequate.   Wall Scoring Baseline  Score Index: 1.00  The left ventricular wall motion is normal.  CARDIAC CATH: 11/06/2019  Prox RCA lesion with 60% stenosis.   Coronary angiogram demonstrated a 60% proximal nondominant small right  coronary stenosis which is not responsible for patient's current clinical  picture.   Total contrast used these procedures 35 cc likely will not affect  patient's renal status   Sheath removal, bedrest, hydration, continue medical management  Coronary Findings Diagnostic Dominance: Left  Right Coronary Artery: Prox RCA lesion with 60% stenosis.   Intervention  No interventions have been documented.   Technique - Cardiology  The patient was brought to the cardiovascular procedural area non-sedated, in the fasting state. Blood pressure, EKG,  and pulse oximetric monitoring was performed continuously throughout the procedure. No sedation was given. It is estimated the patient lost 19m of blood during the procedure.   The skin of the right groin and right arm was clipped, prepped and draped in the usual sterile manner. (If not otherwise specified, skin prep was bilateral.) Pressure Channel 1 zeroed. The skin at the access site was anesthetized. Using a flashback needle with the Modified Seldinger technique the right radial artery was succesfully accessed in a retrograde fashion over the guidewire, under fluoroscopic guidance using a Ss Kit 665f10cm Flex Straight 0.021in X 45cm. A (Catheter [id number] Crv 100cm Radopq Braid Slct Sup) catheter was inserted. The left coronary artery was selectively engaged and injected. Multiple views of the injected vessel were taken. Catheter removed intact.   A (Catheter [id number]  Crv 100cm Radopq Braid Slct Sup) catheter was inserted. Catheter removed intact.   A (Catheter 34f Mpa2 Crv 100cm 2 Sh Radopq Braid Sup) catheter was inserted. The right coronary artery was non-selectively injected. The right coronary artery was selectively engaged and injected. Catheter removed intact.    NM Heart Spect Ph Stress (C) 05/29/21  Anatomical Region Laterality Modality  Chest -- Nuclear Medicine   Impression  Impression:   1. No chest pain with stress. 2. No ST changes to suggest ischemia. 3. Normal LV systolic function and wall motion. 4. No ischemia by perfusion imaging. 5. Low risk study.  Laboratory Data:  Chemistry Recent Labs  Lab 10/18/21 0737 10/19/21 0623 10/20/21 0646  NA 136 138 138  K 3.3* 3.5 4.5  CL 105 108 109  CO2 21* 21* 22  GLUCOSE 157* 77 62*  BUN 49* 51* 55*  CREATININE 3.85* 3.56* 4.14*  CALCIUM 8.2* 8.2* 8.4*  GFRNONAA 17* 19* 16*  ANIONGAP 10 9 7     Recent Labs  Lab 10/18/21 0737 10/20/21 0646  PROT 7.6 7.0  ALBUMIN 3.4* 3.1*  AST 27 12*  ALT 12 7  ALKPHOS 98 91   BILITOT 0.6 0.7    Hematology Recent Labs  Lab 10/18/21 0737 10/19/21 0623 10/20/21 0646  WBC 8.9 6.9 7.0  RBC 3.09* 2.89* 2.82*  HGB 9.1* 8.5* 8.3*  HCT 28.6* 26.3* 25.9*  MCV 92.6 91.0 91.8  MCH 29.4 29.4 29.4  MCHC 31.8 32.3 32.0  RDW 15.2 14.9 15.3  PLT 352 331 328    BNP Recent Labs  Lab 10/18/21 0737  BNP 1,009.0*       Radiology/Studies:  DG Chest Port 1 View  Result Date: 10/18/2021 CLINICAL DATA:  Cough, shortness of breath, prostate surgery 2 days ago; history coronary artery disease, hypertension, type II diabetes mellitus, CHF, stage IV chronic kidney disease EXAM: PORTABLE CHEST 1 VIEW COMPARISON:  Portable exam 0753 hours compared to 07/09/2021 FINDINGS: Borderline enlargement of cardiac silhouette. Mediastinal contours and pulmonary vascularity normal. Accentuation of perihilar interstitial markings with few Kerley B lines at lateral RIGHT lung base suggestive of minimal pulmonary edema. No segmental consolidation, pleural effusion, or pneumothorax. Osseous structures unremarkable. IMPRESSION: Accentuated perihilar markings and RIGHT basilar Kerley B-lines, question minimal pulmonary edema. Electronically Signed   By: MLavonia DanaM.D.   On: 10/18/2021 08:07     Assessment and Plan:   Acute on chronic diastolic congestive heart failure-patient admitted following recent urological procedure with acute on chronic CHF.  Chest x-ray showed possible mild CHF and BNP elevated.  He has been diuresed with improvement in his symptoms and lower extremity edema though he does continue to complain of shortness of breath.  However his creatinine has increased to 4.14 today.  Possible mild overdiuresis.  Would hold diuretics today and reassess tomorrow.  Would repeat chest x-ray as well.  Home regimen is Lasix 40 mg twice daily which likely could be resumed tomorrow if renal function stable. Acute on chronic stage IV kidney disease-creatinine has increased to 4.14.  Agree with  nephrology consult.  Likely secondary to mild overdiuresis. Hypertension-agree with holding ACE inhibitor in the setting of worsening renal function.  Continue other medications and follow. History of moderate stenosis in the right coronary artery-patient has vague chest tightness that increases with lying flat and likely related to CHF.  Continue statin.  Plavix is on hold due to hematuria. Hyperlipidemia-continue Lipitor. Status post TURP-follow-up urology.   Risk Assessment/Risk Scores:  New York Heart Association (NYHA) Functional Class NYHA Class III        For questions or updates, please contact [place] Please consult [email or web address] for contact info under    Signed, Rosaria Ferries, PA-C  10/20/2021 9:20 AM

## 2021-10-20 NOTE — Progress Notes (Signed)
Heart Failure Navigator Progress Note  Assessed for Heart & Vascular TOC clinic readiness.  Patient does not meet criteria due to CKD 4 , Creat 4.14, EF 60-65%, .   Navigator available for reassessment of patient.   Earnestine Leys, BSN, Clinical cytogeneticist Only

## 2021-10-20 NOTE — Progress Notes (Signed)
Progress Note   Patient: Ryan Jenkins:[id number] DOB: 04/15/1960 DOA: 10/18/2021     1 DOS: the patient was seen and examined on 10/20/2021   Brief hospital course: 61 y.o. M with dCHF, CKD IV baseline 2.6, CAD never PCI, DM, gastroparesis, HTN and BPH s/p TURP on 10/2 who presented with leg swelling and dyspnea.   Patient had an uncomplicated TURP 2 days prior to admission.  Discharged yesterday, felt like breathing was at baseline, did notice some expected hematuria and bladder spasms at discharge.   This morning woke with leg swelling, increased shortness of breath from baseline, came to the ER.   In the ER, BNP >1000, CXR with edema.  Ambulated brief distance and desaturated to the 80s.  Given Lasix and hospitalists consulted for admission.  Assessment and Plan: * Acute on chronic diastolic CHF (congestive heart failure) (HCC) EF 60 to 65% with grade 2 diastolic function and no valvular disease in May 2023. Presents now with leg swelling, orthopnea, dyspnea on exertion, chest x-ray showing edema, and elevated BNP. - pt was continued on IV Lasix 80 twice daily - Still symptomatic with chest discomfort and increased SOB -Seen at bedside with Cardiology today. Recs to hold off on further diuretics today and reassess tomorrow -Will recheck BMET in AM   Anemia in chronic kidney disease (CKD) - Trend hemoglobin in setting of mild gross hematuria -hgb down slightly ot 8.3 this AM -Holding plavix for now, keep foley in place until urine clears   Hypokalemia - normalized -recheck bmet in AM   BPH (benign prostatic hyperplasia) Status post TURP 10/2, two days PTA by [name] -Continue cephalexin - Continue Percocet - Continue finasteride and Flomax - Ditropan x1 now for bladder spasms - Initial plan was for voiding trial and if fails will put an 32 French Foley and follow-up in the office. Given dark urine and elevated Cr, would keep cath in place for now   Coronary artery  disease - Continue atorvastatin - Hold ACE inhibitor given renal function -briefly hold plavix given slight drop in hgb and dark urine - Recheck cbc in AM     Acute renal failure superimposed on stage 4 chronic kidney disease (HCC) Creatinine up to 3.8 from baseline 2.6 at presentation -Holding lisinopril for now - Stopped gabapentin given worsening renal function -Cr up to over 4 this AM. Holding further lasix -recheck bmet in AM     Hyperlipidemia - Continue atorvastatin   Essential hypertension Blood pressure elevated - Continue amlodipine, metoprolol, hydralazine - Continue IV Lasix - Hold ACE inhibitor for now given renal function   Type 2 diabetes mellitus with peripheral neuropathy (HCC) - Sliding scale correction insulin ordered -hypoglycemic this AM, likely reflection of decreasing GFR. Reduced semglee to 15 units    GERD - Continue PPI   Mood disorder - Continue escitalopram   Diabetic neuropathy - Hold gabapentin - Continue nortriptyline      Subjective: Reports still having some sob   Physical Exam: Vitals:   10/19/21 2129 10/20/21 0518 10/20/21 0521 10/20/21 1506  BP: (!) 150/74  (!) 141/77 (!) 140/74  Pulse: 74  75 72  Resp: '18  17 19  '$ Temp: 98.5 F (36.9 C)  99.3 F (37.4 C) 98.4 F (36.9 C)  TempSrc: Oral  Oral Oral  SpO2: 93%  94% 94%  Weight:  105.6 kg    Height:       General exam: Conversant, in no acute distress Respiratory system: normal  chest rise, clear, no audible wheezing Cardiovascular system: regular rhythm, s1-s2 Gastrointestinal system: Nondistended, nontender, pos BS Central nervous system: No seizures, no tremors Extremities: No cyanosis, no joint deformities Skin: No rashes, no pallor Psychiatry: Affect normal // no auditory hallucinations   Data Reviewed:  Labs reviewed: Na 138, K 4.5, Cr 4.14   Family Communication: Pt in room, family not at bedside  Disposition: Status is: Inpatient Continue inpatient stay  because of severity of illness  Planned Discharge Destination: Home     Author: Marylu Lund, MD 10/20/2021 5:14 PM  For on call review [email or web address].

## 2021-10-21 LAB — BASIC METABOLIC PANEL
Anion gap: 7 (ref 5–15)
BUN: 60 mg/dL — ABNORMAL HIGH (ref 8–23)
CO2: 22 mmol/L (ref 22–32)
Calcium: 8.5 mg/dL — ABNORMAL LOW (ref 8.9–10.3)
Chloride: 109 mmol/L (ref 98–111)
Creatinine, Ser: 4.06 mg/dL — ABNORMAL HIGH (ref 0.61–1.24)
GFR, Estimated: 16 mL/min — ABNORMAL LOW (ref >60)
Glucose, Bld: 69 mg/dL — ABNORMAL LOW (ref 70–99)
Potassium: 4.4 mmol/L (ref 3.5–5.1)
Sodium: 138 mmol/L (ref 135–145)

## 2021-10-21 LAB — CBC
HCT: 26.4 % — ABNORMAL LOW (ref 39.0–52.0)
Hemoglobin: 8.6 g/dL — ABNORMAL LOW (ref 13.0–17.0)
MCH: 29.9 pg (ref 26.0–34.0)
MCHC: 32.6 g/dL (ref 30.0–36.0)
MCV: 91.7 fL (ref 80.0–100.0)
Platelets: 329 10*3/uL (ref 150–400)
RBC: 2.88 MIL/uL — ABNORMAL LOW (ref 4.22–5.81)
RDW: 15.1 % (ref 11.5–15.5)
WBC: 7 10*3/uL (ref 4.0–10.5)
nRBC: 0 % (ref 0.0–0.2)

## 2021-10-21 LAB — GLUCOSE, CAPILLARY
Glucose-Capillary: 63 mg/dL — ABNORMAL LOW (ref 70–99)
Glucose-Capillary: 110 mg/dL — ABNORMAL HIGH (ref 70–99)
Glucose-Capillary: 99 mg/dL (ref 70–99)
Glucose-Capillary: 112 mg/dL — ABNORMAL HIGH (ref 70–99)

## 2021-10-21 MED ORDER — FUROSEMIDE 10 MG/ML IJ SOLN
80.0000 mg | Freq: Two times a day (BID) | INTRAMUSCULAR | Status: AC
  Administered 2021-10-21: 80 mg via INTRAVENOUS
  Filled 2021-10-21: qty 8

## 2021-10-21 MED ORDER — INSULIN GLARGINE-YFGN 100 UNIT/ML ~~LOC~~ SOLN
10.0000 [IU] | Freq: Every day | SUBCUTANEOUS | Status: DC
  Administered 2021-10-21: 10 [IU] via SUBCUTANEOUS
  Filled 2021-10-21 (×2): qty 0.1

## 2021-10-21 MED ORDER — CLOPIDOGREL BISULFATE 75 MG PO TABS
75.0000 mg | ORAL_TABLET | Freq: Every day | ORAL | Status: DC
  Administered 2021-10-21 – 2021-10-22 (×2): 75 mg via ORAL
  Filled 2021-10-21 (×2): qty 1

## 2021-10-21 NOTE — Progress Notes (Signed)
Progress Note  Patient Name: Ryan Jenkins Date of Encounter: 10/21/2021  Primary Cardiologist:   None   Subjective   He reports continued shortness of breath.  He remains on O2.  He did ambulate in the hallway yesterday.  Inpatient Medications    Scheduled Meds:  amLODipine  10 mg Oral Daily   atorvastatin  40 mg Oral Daily   cephALEXin  500 mg Oral BID   Chlorhexidine Gluconate Cloth  6 each Topical Daily   enoxaparin (LOVENOX) injection  30 mg Subcutaneous Q24H   escitalopram  20 mg Oral Daily   finasteride  5 mg Oral Daily   furosemide  80 mg Intravenous BID   hydrALAZINE  100 mg Oral TID   insulin aspart  0-15 Units Subcutaneous TID WC   insulin aspart  0-5 Units Subcutaneous QHS   insulin glargine-yfgn  15 Units Subcutaneous QHS   metoprolol succinate  50 mg Oral QHS   nortriptyline  25 mg Oral QHS   pantoprazole  40 mg Oral Daily   tamsulosin  0.4 mg Oral QHS   Continuous Infusions:  PRN Meds: acetaminophen **OR** acetaminophen, ondansetron **OR** ondansetron (ZOFRAN) IV, oxyCODONE-acetaminophen, traZODone   Vital Signs    Vitals:   10/20/21 0521 10/20/21 1506 10/20/21 2049 10/21/21 0528  BP: (!) 141/77 (!) 140/74 (!) 149/79 (!) 151/78  Pulse: 75 72 78 76  Resp: '17 19 18 18  '$ Temp: 99.3 F (37.4 C) 98.4 F (36.9 C) 99.2 F (37.3 C) 99.3 F (37.4 C)  TempSrc: Oral Oral Oral Oral  SpO2: 94% 94% 97% 93%  Weight:    101.2 kg  Height:        Intake/Output Summary (Last 24 hours) at 10/21/2021 0731 Last data filed at 10/21/2021 0648 Gross per 24 hour  Intake 480 ml  Output 2675 ml  Net -2195 ml   Filed Weights   10/19/21 1610 10/20/21 0518 10/21/21 0528  Weight: 103.6 kg 105.6 kg 101.2 kg    Telemetry    Normal sinus rhythm- Personally Reviewed  ECG    NA - Personally Reviewed  Physical Exam   GEN: No acute distress.   Neck: No  JVD Cardiac: RRR, no murmurs, rubs, or gallops.  Respiratory: Clear  to auscultation bilaterally. GI:  Soft, nontender, non-distended  MS: No  edema; No deformity. Neuro:  Nonfocal  Psych: Normal affect   Labs    Chemistry Recent Labs  Lab 10/18/21 0737 10/19/21 0623 10/20/21 0646  NA 136 138 138  K 3.3* 3.5 4.5  CL 105 108 109  CO2 21* 21* 22  GLUCOSE 157* 77 62*  BUN 49* 51* 55*  CREATININE 3.85* 3.56* 4.14*  CALCIUM 8.2* 8.2* 8.4*  PROT 7.6  --  7.0  ALBUMIN 3.4*  --  3.1*  AST 27  --  12*  ALT 12  --  7  ALKPHOS 98  --  91  BILITOT 0.6  --  0.7  GFRNONAA 17* 19* 16*  ANIONGAP '10 9 7     '$ Hematology Recent Labs  Lab 10/19/21 0623 10/20/21 0646 10/21/21 0649  WBC 6.9 7.0 7.0  RBC 2.89* 2.82* 2.88*  HGB 8.5* 8.3* 8.6*  HCT 26.3* 25.9* 26.4*  MCV 91.0 91.8 91.7  MCH 29.4 29.4 29.9  MCHC 32.3 32.0 32.6  RDW 14.9 15.3 15.1  PLT 331 328 329    Cardiac EnzymesNo results for input(s): "TROPONINI" in the last 168 hours. No results for input(s): "TROPIPOC" in the  last 168 hours.   BNP Recent Labs  Lab 10/18/21 0737  BNP 1,009.0*     DDimer No results for input(s): "DDIMER" in the last 168 hours.   Radiology    US RENAL  Result Date: 10/20/2021 CLINICAL DATA:  Acute renal failure superimposed on chronic stage 4 kidney disease. EXAM: RENAL / URINARY TRACT ULTRASOUND COMPLETE COMPARISON:  Renal ultrasound 06/06/2021 FINDINGS: Right Kidney: Renal measurements: 11.3 x 4.7 x 4.9 cm = volume: 137 mL. Echogenicity is increased. No mass or hydronephrosis visualized. Left Kidney: Renal measurements: 10.6 x 5.6 x 4.3 cm = volume: 134 mL. Echogenicity is increased. No mass or hydronephrosis visualized. Bladder: Bladder is decompressed by Foley catheter. Other: None. IMPRESSION: 1. Increased echogenicity of the bilateral kidneys as can be seen in medical renal disease. No hydronephrosis. 2. Bladder is decompressed by Foley catheter. Electronically Signed   By: Ronney Asters M.D.   On: 10/20/2021 20:29   DG CHEST PORT 1 VIEW  Result Date: 10/20/2021 CLINICAL DATA:   Congestive heart failure EXAM: PORTABLE CHEST 1 VIEW COMPARISON:  Chest x-ray dated October 18, 2021 FINDINGS: Unchanged heart size. Mild bilateral perihilar predominant heterogeneous opacities, increased when compared to prior exam. New small left pleural effusion. No evidence of pneumothorax. IMPRESSION: 1. Increased mild pulmonary edema. 2. New small left pleural effusion. Electronically Signed   By: Yetta Glassman M.D.   On: 10/20/2021 14:21    Cardiac Studies   ECHO: 06/05/21  1. Left ventricular ejection fraction, by estimation, is 60 to 65%. The left ventricle has normal function. The left ventricle has no regional wall motion abnormalities. There is moderate concentric left ventricular hypertrophy. Left ventricular diastolic parameters are consistent with Grade II diastolic dysfunction  (pseudonormalization).   2. Right ventricular systolic function is normal. The right ventricular size is normal.   3. The mitral valve is normal in structure. Trivial mitral valve  regurgitation.   4. The aortic valve is tricuspid. There is mild calcification of the  aortic valve. There is mild thickening of the aortic valve. Aortic valve regurgitation is not visualized. Aortic valve sclerosis/calcification is present, without any evidence of  aortic stenosis.   5. The inferior vena cava is normal in size with greater than 50%  respiratory variability, suggesting right atrial pressure of 3 mmHg.    Patient Profile     61 y.o. male with a hx of poorly controlled diabetes, HTN, HLD, CKD IV, HFpEF, diabetic polyneuropathy, Hx acute hypoxic respiratory failure with ARDS 10/2019 who is being seen 10/20/2021 for the evaluation of CHF at the request of Dr. Wyline Copas.   Assessment & Plan    Acute on chronic diastolic congestive heart failure-patient:   Net negative 4.15 liters.    I would suggest continuing to hold diuresis today.    Acute on chronic stage IV kidney disease:  Creat is down slightly with his  diuretic held yesterday.    Hypertension:  BP is mildly elevated.  If still increased and HR allows in the AM I will increase the beta blocker.   History of moderate stenosis in the right coronary artery:   Medical management with this Plavix held.    Hyperlipidemia:   Dyslipidemia.  -continue Lipitor.  Status post TURP   For questions or updates, please contact [place] Please consult [email or web address] for contact info under Cardiology/STEMI.   Signed, Minus Breeding, MD  10/21/2021, 7:31 AM

## 2021-10-21 NOTE — Progress Notes (Signed)
[facility] Progress Note  Subjective: UOP 1700 yest and 1900 today so far, BP's good and wts are down significantly to to 101 kg today. Pt denies any SOB.   Vitals:   10/20/21 0521 10/20/21 1506 10/20/21 2049 10/21/21 0528  BP: (!) 141/77 (!) 140/74 (!) 149/79 (!) 151/78  Pulse: 75 72 78 76  Resp: _0 Temp: 99.3 F (37.4 C) 98.4 F (36.9 C) 99.2 F (37.3 C) 99.3 F (37.4 C)  TempSrc: Oral Oral Oral Oral  SpO2: 94% 94% 97% 93%  Weight:    101.2 kg  Height:        Exam: Gen alert, no distress No jvd or bruits Chest clear bilat , no rales RRR no RG Abd soft ntnd no mass or ascites +bs GU normal male, foley out MS no joint effusions or deformity Ext no LE edema Neuro is alert, Ox 3 , nf    Home meds include - amlodipine 10, atorvastatin, clopidogrel, escitalopram, famotidine, finasteride, lasix 40 bid, gabapentin, hydralazine 100 tid, insulin lantus, lisinopril 10 qd, metoprolol 50 xl qd, nortriptyline, percocet prn, protonix, klorcon, sod bicarb, tamsulosin, trazodone, prns/ vits/ supps       Date               Creat               eGFR   2022              2.5- 2.23 Jan 2021       2.2- 3.19 May 2021      5.0 >> 3.22 June 2021     2.52- 2.80        27- 37m/min, CKD IV   Sept 2023      3.08                 22 ml/min   10/2               3.26                 21 ml/min    10/3              3.49     10/4             3.85      10/5            3.56      10/ 6           4.14                 16 ml/min       CXR 10/4 - IMPRESSION: Accentuated perihilar markings and RIGHT basilar Kerley B-lines, question minimal pulmonary edema.    CXR 10/6 - IMPRESSION: Increased mild pulmonary edema. New small left pleural effusion.        BP 140 75, HR 70s , RR 14-23  , afeb     Na 138  K 4.5  CO2 22  BUN 5  creat 4.14  Alb 3.1  HB 8.3  WBC 7    Renal [place]- 11.3/ 10.6 cm kidneys w/o hydro, ^'d echo    UA - pending   Assessment/ Plan: AKI  on CKD 4 - b/l creat 2.5- 2.8 from June 2023.  Creat here 3.5 yest and 4.1 today in setting of pulm edema/ SOB getting IV lasix. Has had a good response, but was still wet on CXR 10/06, so IV lasix was resumed at 52m bid yesterday. UOP was very good overnight and creat leveled off today at 4.0 (4.1 yest). Still SOB. Will cont IV lasix another day and repeat CXR and labs in am and hopefully can transition to po lasix soon.  AHRF - due to vol overload from advanced CKD and /or diast HF. As above.  SP TURP - done 10/02, foley removed yesterday Anemia - Hb 8s, follow CAD - stable DM2 - on insulin         Ryan Jenkins 10/21/2021, 12:59 PM   Recent Labs  Lab 10/18/21 0737 10/19/21 0623 10/20/21 0646 10/21/21 0649  HGB 9.1*   < > 8.3* 8.6*  ALBUMIN 3.4*  --  3.1*  --   CALCIUM 8.2*   < > 8.4* 8.5*  CREATININE 3.85*   < > 4.14* 4.06*  K 3.3*   < > 4.5 4.4   < > = values in this interval not displayed.   No results for input(s): "IRON", "TIBC", "FERRITIN" in the last 168 hours. Inpatient medications:  amLODipine  10 mg Oral Daily   atorvastatin  40 mg Oral Daily   cephALEXin  500 mg Oral BID   Chlorhexidine Gluconate Cloth  6 each Topical Daily   clopidogrel  75 mg Oral Daily   enoxaparin (LOVENOX) injection  30 mg Subcutaneous Q24H   escitalopram  20 mg Oral Daily   finasteride  5 mg Oral Daily   furosemide  80 mg Intravenous BID   hydrALAZINE  100 mg Oral TID   insulin aspart  0-15 Units Subcutaneous TID WC   insulin aspart  0-5 Units Subcutaneous QHS   insulin glargine-yfgn  15 Units Subcutaneous QHS   metoprolol succinate  50 mg Oral QHS   nortriptyline  25 mg Oral QHS   pantoprazole  40 mg Oral Daily   tamsulosin  0.4 mg Oral QHS    acetaminophen **OR** acetaminophen, ondansetron **OR** ondansetron (ZOFRAN) IV, oxyCODONE-acetaminophen, traZODone

## 2021-10-21 NOTE — Progress Notes (Addendum)
  Subjective: Doing well this morning.  Afebrile and hemodynamically stable.  Foley removed early this morning and patient has already voided once.  He reports that his urine was watermelon in color without clots.  Noted some dysuria  Objective: Vital signs in last 24 hours: Temp:  [98.4 F (36.9 C)-99.3 F (37.4 C)] 99.3 F (37.4 C) (10/07 0528) Pulse Rate:  [72-78] 76 (10/07 0528) Resp:  [18-19] 18 (10/07 0528) BP: (140-151)/(74-79) 151/78 (10/07 0528) SpO2:  [93 %-97 %] 93 % (10/07 0528) Weight:  [101.2 kg] 101.2 kg (10/07 0528)  Intake/Output from previous day: 10/06 0701 - 10/07 0700 In: 480 [P.O.:480] Out: 2675 [Urine:2675] Intake/Output this shift: Total I/O In: -  Out: 100 [Urine:100]  Physical Exam:  General: Alert and oriented CV: Regular rate Lungs: Normal work of breathing on room air GU: Voiding spontaneously Ext: NT, No erythema  Lab Results: Recent Labs    10/19/21 0623 10/20/21 0646 10/21/21 0649  HGB 8.5* 8.3* 8.6*  HCT 26.3* 25.9* 26.4*    BMET Recent Labs    10/20/21 0646 10/21/21 0649  NA 138 138  K 4.5 4.4  CL 109 109  CO2 22 22  GLUCOSE 62* 69*  BUN 55* 60*  CREATININE 4.14* 4.06*  CALCIUM 8.4* 8.5*      Studies/Results: US RENAL  Result Date: 10/20/2021 CLINICAL DATA:  Acute renal failure superimposed on chronic stage 4 kidney disease. EXAM: RENAL / URINARY TRACT ULTRASOUND COMPLETE COMPARISON:  Renal ultrasound 06/06/2021 FINDINGS: Right Kidney: Renal measurements: 11.3 x 4.7 x 4.9 cm = volume: 137 mL. Echogenicity is increased. No mass or hydronephrosis visualized. Left Kidney: Renal measurements: 10.6 x 5.6 x 4.3 cm = volume: 134 mL. Echogenicity is increased. No mass or hydronephrosis visualized. Bladder: Bladder is decompressed by Foley catheter. Other: None. IMPRESSION: 1. Increased echogenicity of the bilateral kidneys as can be seen in medical renal disease. No hydronephrosis. 2. Bladder is decompressed by Foley catheter.  Electronically Signed   By: Ronney Asters M.D.   On: 10/20/2021 20:29   DG CHEST PORT 1 VIEW  Result Date: 10/20/2021 CLINICAL DATA:  Congestive heart failure EXAM: PORTABLE CHEST 1 VIEW COMPARISON:  Chest x-ray dated October 18, 2021 FINDINGS: Unchanged heart size. Mild bilateral perihilar predominant heterogeneous opacities, increased when compared to prior exam. New small left pleural effusion. No evidence of pneumothorax. IMPRESSION: 1. Increased mild pulmonary edema. 2. New small left pleural effusion. Electronically Signed   By: Yetta Glassman M.D.   On: 10/20/2021 14:21    Assessment/Plan: BPH s/p TURP 10/16/2021  -Trial of void today.  Foley has been removed and patient has voided.  We will continue to monitor progress. -Appreciate assistance of IM service to manage his CHF   LOS: 2 days   Josph Macho, MD Resident Physician [facility]  Voiding clear yellow urine in urinal. Void trial ongoing. Check PVRs. If elevated, will need to replace 18 Fr foley and arrange for void trial outpatient.  Matt R. [facility]  Pager: [phone]

## 2021-10-21 NOTE — Progress Notes (Signed)
Progress Note   Patient: Ryan Jenkins QIW:[id number] DOB: 1960/06/16 DOA: 10/18/2021     2 DOS: the patient was seen and examined on 10/21/2021   Brief hospital course: 61 y.o. M with dCHF, CKD IV baseline 2.6, CAD never PCI, DM, gastroparesis, HTN and BPH s/p TURP on 10/2 who presented with leg swelling and dyspnea.   Patient had an uncomplicated TURP 2 days prior to admission.  Discharged yesterday, felt like breathing was at baseline, did notice some expected hematuria and bladder spasms at discharge.   This morning woke with leg swelling, increased shortness of breath from baseline, came to the ER.   In the ER, BNP >1000, CXR with edema.  Ambulated brief distance and desaturated to the 80s.  Given Lasix and hospitalists consulted for admission.  Assessment and Plan: * Acute on chronic diastolic CHF (congestive heart failure) (HCC) EF 60 to 65% with grade 2 diastolic function and no valvular disease in May 2023. Presents now with leg swelling, orthopnea, dyspnea on exertion, chest x-ray showing edema, and elevated BNP. - concerns of volume overload with recent repeat CXR still demonstrating pulm edema -Appreciate input by Cardiology and Nephrology. Pt is continued on IV lasix per nephrology -Cr slightly lower at 4.06 -Will recheck BMET in AM   Anemia in chronic kidney disease (CKD) - Trend hemoglobin in setting of mild gross hematuria -hgb currently stable -initially held plavix given concerns of blood in urine. -Urine now more clear. Have resumed plavix   Hypokalemia - normalized -recheck bmet in AM   BPH (benign prostatic hyperplasia) Status post TURP 10/2, two days PTA by [name] -Continue cephalexin - Continue Percocet - Continue finasteride and Flomax - Urology following, appreciate assistance. Cath removed today, voiding trial in progress   Coronary artery disease - Continue atorvastatin - Hold ACE inhibitor given renal function - Recheck cbc in AM -Plavix  resumed     Acute renal failure superimposed on stage 4 chronic kidney disease (HCC) Creatinine up to 3.8 from baseline 2.6 at presentation -Holding lisinopril for now - Stopped gabapentin given worsening renal function -Cr up to over 4.14, now 4.06 -Clinically volume overloaded     Hyperlipidemia - Continue atorvastatin   Essential hypertension Blood pressure elevated - Continue amlodipine, metoprolol, hydralazine - Continue IV Lasix per Nephrology - Hold ACE inhibitor for now given renal function   Type 2 diabetes mellitus with peripheral neuropathy (HCC) - Sliding scale correction insulin ordered -hypoglycemic this AM with glucose of 63. Will decrease semglee further to 10 units    GERD - Continue PPI   Mood disorder - Continue escitalopram   Diabetic neuropathy - Hold gabapentin - Continue nortriptyline      Subjective: Reports feeling somewhat better  Physical Exam: Vitals:   10/20/21 1506 10/20/21 2049 10/21/21 0528 10/21/21 1355  BP: (!) 140/74 (!) 149/79 (!) 151/78 (!) 160/74  Pulse: 72 78 76 75  Resp: '19 18 18 18  '$ Temp: 98.4 F (36.9 C) 99.2 F (37.3 C) 99.3 F (37.4 C) 98.7 F (37.1 C)  TempSrc: Oral Oral Oral Oral  SpO2: 94% 97% 93% 99%  Weight:   101.2 kg   Height:       General exam: Awake, laying in bed, in nad Respiratory system: Normal respiratory effort, no wheezing Cardiovascular system: regular rate, s1, s2 Gastrointestinal system: Soft, nondistended, positive BS Central nervous system: CN2-12 grossly intact, strength intact Extremities: Perfused, no clubbing Skin: Normal skin turgor, no notable skin lesions seen Psychiatry: Mood  normal // no visual hallucinations   Data Reviewed:  Labs reviewed: Na 138, K 4.4, Cr 4.06   Family Communication: Pt in room, family not at bedside  Disposition: Status is: Inpatient Continue inpatient stay because of severity of illness  Planned Discharge Destination: Home     Author: Marylu Lund, MD 10/21/2021 4:25 PM  For on call review [email or web address].

## 2021-10-22 DIAGNOSIS — N171 Acute kidney failure with acute cortical necrosis: Secondary | ICD-10-CM

## 2021-10-22 LAB — GLUCOSE, CAPILLARY
Glucose-Capillary: 105 mg/dL — ABNORMAL HIGH (ref 70–99)
Glucose-Capillary: 74 mg/dL (ref 70–99)
Glucose-Capillary: 164 mg/dL — ABNORMAL HIGH (ref 70–99)

## 2021-10-22 LAB — BASIC METABOLIC PANEL
Anion gap: 10 (ref 5–15)
BUN: 50 mg/dL — ABNORMAL HIGH (ref 8–23)
CO2: 23 mmol/L (ref 22–32)
Calcium: 8.7 mg/dL — ABNORMAL LOW (ref 8.9–10.3)
Chloride: 107 mmol/L (ref 98–111)
Creatinine, Ser: 4.14 mg/dL — ABNORMAL HIGH (ref 0.61–1.24)
GFR, Estimated: 16 mL/min — ABNORMAL LOW (ref >60)
Glucose, Bld: 78 mg/dL (ref 70–99)
Potassium: 4 mmol/L (ref 3.5–5.1)
Sodium: 140 mmol/L (ref 135–145)

## 2021-10-22 LAB — CBC
HCT: 27.4 % — ABNORMAL LOW (ref 39.0–52.0)
Hemoglobin: 8.9 g/dL — ABNORMAL LOW (ref 13.0–17.0)
MCH: 29.4 pg (ref 26.0–34.0)
MCHC: 32.5 g/dL (ref 30.0–36.0)
MCV: 90.4 fL (ref 80.0–100.0)
Platelets: 368 10*3/uL (ref 150–400)
RBC: 3.03 MIL/uL — ABNORMAL LOW (ref 4.22–5.81)
RDW: 14.9 % (ref 11.5–15.5)
WBC: 6.1 10*3/uL (ref 4.0–10.5)
nRBC: 0 % (ref 0.0–0.2)

## 2021-10-22 MED ORDER — FUROSEMIDE 40 MG PO TABS
40.0000 mg | ORAL_TABLET | Freq: Two times a day (BID) | ORAL | Status: DC
  Administered 2021-10-22: 40 mg via ORAL
  Filled 2021-10-22: qty 1

## 2021-10-22 MED ORDER — METOPROLOL SUCCINATE ER 50 MG PO TB24: 100.0000 mg | ORAL_TABLET | Freq: Every day | ORAL | Status: DC

## 2021-10-22 MED ORDER — LANTUS SOLOSTAR 100 UNIT/ML ~~LOC~~ SOPN: 10.0000 [IU] | PEN_INJECTOR | Freq: Every day | SUBCUTANEOUS | 0 refills | Status: DC

## 2021-10-22 MED ORDER — DIPHENHYDRAMINE HCL 25 MG PO CAPS
25.0000 mg | ORAL_CAPSULE | Freq: Four times a day (QID) | ORAL | Status: DC | PRN
  Administered 2021-10-22: 25 mg via ORAL
  Filled 2021-10-22: qty 1

## 2021-10-22 NOTE — Progress Notes (Addendum)
  Subjective: No acute events overnight. Afebrile and stable. Voiding without difficulty after catheter removal yesterday. Urine clear yellow. Feels like he is emptying his bladder well.  Objective: Vital signs in last 24 hours: Temp:  [98.5 F (36.9 C)-98.7 F (37.1 C)] 98.6 F (37 C) (10/08 0400) Pulse Rate:  [73-81] 73 (10/08 0400) Resp:  [17-20] 17 (10/08 0400) BP: (146-160)/(70-79) 146/79 (10/08 0400) SpO2:  [96 %-99 %] 99 % (10/08 0400) Weight:  [101.7 kg] 101.7 kg (10/08 0400)  Intake/Output from previous day: 10/07 0701 - 10/08 0700 In: -  Out: 3675 [Urine:3675] Intake/Output this shift: No intake/output data recorded.  Physical Exam:  General: Alert and oriented CV: Regular rate Lungs: Normal work of breathing on room air GU: Voiding spontaneously Ext: NT, No erythema  Lab Results: Recent Labs    10/20/21 0646 10/21/21 0649  HGB 8.3* 8.6*  HCT 25.9* 26.4*    BMET Recent Labs    10/20/21 0646 10/21/21 0649  NA 138 138  K 4.5 4.4  CL 109 109  CO2 22 22  GLUCOSE 62* 69*  BUN 55* 60*  CREATININE 4.14* 4.06*  CALCIUM 8.4* 8.5*      Studies/Results: US RENAL  Result Date: 10/20/2021 CLINICAL DATA:  Acute renal failure superimposed on chronic stage 4 kidney disease. EXAM: RENAL / URINARY TRACT ULTRASOUND COMPLETE COMPARISON:  Renal ultrasound 06/06/2021 FINDINGS: Right Kidney: Renal measurements: 11.3 x 4.7 x 4.9 cm = volume: 137 mL. Echogenicity is increased. No mass or hydronephrosis visualized. Left Kidney: Renal measurements: 10.6 x 5.6 x 4.3 cm = volume: 134 mL. Echogenicity is increased. No mass or hydronephrosis visualized. Bladder: Bladder is decompressed by Foley catheter. Other: None. IMPRESSION: 1. Increased echogenicity of the bilateral kidneys as can be seen in medical renal disease. No hydronephrosis. 2. Bladder is decompressed by Foley catheter. Electronically Signed   By: Ronney Asters M.D.   On: 10/20/2021 20:29   DG CHEST PORT 1  VIEW  Result Date: 10/20/2021 CLINICAL DATA:  Congestive heart failure EXAM: PORTABLE CHEST 1 VIEW COMPARISON:  Chest x-ray dated October 18, 2021 FINDINGS: Unchanged heart size. Mild bilateral perihilar predominant heterogeneous opacities, increased when compared to prior exam. New small left pleural effusion. No evidence of pneumothorax. IMPRESSION: 1. Increased mild pulmonary edema. 2. New small left pleural effusion. Electronically Signed   By: Yetta Glassman M.D.   On: 10/20/2021 14:21    Assessment/Plan: BPH s/p TURP 10/16/2021  -Requested PVR today from RN staff to ensure patient is emptying bladder well. - If he is retaining, may need to replace 18Fr foley catheter and arrange for TOV outpatient. -Appreciate assistance of IM service to manage his CHF   LOS: 3 days   Josph Macho, MD Resident [facility]ician [facility]  I have seen and examined the patient and agree with the above assessment and plan.  PVRs yesterday 27m. Asked Rns to confirm low PVRs this AM. He denies sensation of incomplete emptying. Will arrange f/u outpatient.  [facility]  Pager: [phone]

## 2021-10-22 NOTE — Progress Notes (Signed)
[facility] Progress Note  Subjective: BP's good 140/70, HR 80.  4000 cc UOP yest and 1.3 L today already. Wts down to 101kg. No labs this am.   Vitals:   10/21/21 0528 10/21/21 1355 10/21/21 1949 10/22/21 0400  BP: (!) 151/78 (!) 160/74 (!) 148/70 (!) 146/79  Pulse: 76 75 81 73  Resp: _0 Temp: 99.3 F (37.4 C) 98.7 F (37.1 C) 98.5 F (36.9 C) 98.6 F (37 C)  TempSrc: Oral Oral Oral Oral  SpO2: 93% 99% 96% 99%  Weight: 101.2 kg   101.7 kg  Height:        Exam: Gen alert, no distress No jvd or bruits Chest clear bilat , no rales RRR no RG Abd soft ntnd no mass or ascites +bs GU normal male, foley out MS no joint effusions or deformity Ext no LE edema Neuro is alert, Ox 3 , nf    Home meds include - amlodipine 10, atorvastatin, clopidogrel, escitalopram, famotidine, finasteride, lasix 40 bid, gabapentin, hydralazine 100 tid, insulin lantus, lisinopril 10 qd, metoprolol 50 xl qd, nortriptyline, percocet prn, protonix, klorcon, sod bicarb, tamsulosin, trazodone, prns/ vits/ supps       Date               Creat               eGFR   2022              2.5- 2.23 Jan 2021       2.2- 3.19 May 2021      5.0 >> 3.22 June 2021     2.52- 2.80        27- 63m/min, CKD IV   Sept 2023      3.08                 22 ml/min   10/2               3.26                 21 ml/min    10/3              3.49     10/4             3.85      10/5            3.56      10/ 6           4.14                 16 ml/min       CXR 10/4 - IMPRESSION: Accentuated perihilar markings and RIGHT basilar Kerley B-lines, question minimal pulmonary edema.    CXR 10/6 - IMPRESSION: Increased mild pulmonary edema. New small left pleural effusion.            Renal [place]- 11.3/ 10.6 cm kidneys w/o hydro, ^'d echo    Assessment/ Plan: AKI on CKD 4 - b/l creat 2.5- 2.8 from June 2023. Creat here 3.5 yest and 4.1 today in setting of pulm edema/ SOB getting IV lasix.  Has had a good response, but was still wet on CXR 10/06, so IV lasix was restarted at 855mIV bid. UOP 1.7 L on 10/6  and 4 L UOP yesterday, and today is off of O2 support. Looks good. Will plan change to po lasix at usual dose 58m bid and will arrange for f/u visit w/ [facility] in 2-3 wks. OK for dc home.   AHRF - from vol excess related to CKD IV and diast HF, now resolved SP TURP - done 10/02 on prior admission. Foley removed here on 10/6, per  urology Anemia - Hb 8s, follow CAD - stable DM2 - on insulin         Ryan Jenkins 10/22/2021, 6:15 AM   Recent Labs  Lab 10/18/21 0737 10/19/21 0623 10/20/21 0646 10/21/21 0649  HGB 9.1*   < > 8.3* 8.6*  ALBUMIN 3.4*  --  3.1*  --   CALCIUM 8.2*   < > 8.4* 8.5*  CREATININE 3.85*   < > 4.14* 4.06*  K 3.3*   < > 4.5 4.4   < > = values in this interval not displayed.    No results for input(s): "IRON", "TIBC", "FERRITIN" in the last 168 hours. Inpatient medications:  amLODipine  10 mg Oral Daily   atorvastatin  40 mg Oral Daily   cephALEXin  500 mg Oral BID   Chlorhexidine Gluconate Cloth  6 each Topical Daily   clopidogrel  75 mg Oral Daily   enoxaparin (LOVENOX) injection  30 mg Subcutaneous Q24H   escitalopram  20 mg Oral Daily   finasteride  5 mg Oral Daily   hydrALAZINE  100 mg Oral TID   insulin aspart  0-15 Units Subcutaneous TID WC   insulin aspart  0-5 Units Subcutaneous QHS   insulin glargine-yfgn  10 Units Subcutaneous QHS   metoprolol succinate  50 mg Oral QHS   nortriptyline  25 mg Oral QHS   pantoprazole  40 mg Oral Daily   tamsulosin  0.4 mg Oral QHS    acetaminophen **OR** acetaminophen, ondansetron **OR** ondansetron (ZOFRAN) IV, oxyCODONE-acetaminophen, traZODone

## 2021-10-22 NOTE — Discharge Summary (Signed)
Physician Discharge Summary   Patient: Ryan Jenkins MRN: [id number] DOB: [birth year]  Admit date:     10/18/2021  Discharge date: 10/22/21  Discharge Physician: Marylu Lund   PCP: [name], MD   Recommendations at discharge:   Follow up with PCP in 1-2 weeks Follow up with [facility] as scheduled in 2-3 weeks Follow up with Urology as scheduled   Discharge Diagnoses: Principal Problem:   Acute on chronic diastolic CHF (congestive heart failure) ([place]) Active Problems:   Type 2 diabetes mellitus with peripheral neuropathy ([place])   Essential hypertension   Hyperlipidemia   Acute renal failure superimposed on stage 4 chronic kidney disease (HCC)   Coronary artery disease   BPH (benign prostatic hyperplasia)   Hypokalemia   Anemia in chronic kidney disease (CKD)   CHF exacerbation (HCC)  Resolved Problems:   * No resolved hospital problems. [facility] Course: 61 y.o. M with dCHF, CKD IV baseline 2.6, CAD never PCI, DM, gastroparesis, HTN and BPH s/p TURP on 10/2 who presented with leg swelling and dyspnea.   Patient had an uncomplicated TURP 2 days prior to admission.  Discharged yesterday, felt like breathing was at baseline, did notice some expected hematuria and bladder spasms at discharge.   This morning woke with leg swelling, increased shortness of breath from baseline, came to the ER.   In the ER, BNP >1000, CXR with edema.  Ambulated brief distance and desaturated to the 80s.  Given Lasix and hospitalists consulted for admission.  Assessment and Plan: * Acute on chronic diastolic CHF (congestive heart failure) (HCC) EF 60 to 65% with grade 2 diastolic function and no valvular disease in May 2023. Presents now with leg swelling, orthopnea, dyspnea on exertion, chest x-ray showing edema, and elevated BNP. - concerns of volume overload with recent repeat CXR still demonstrating pulm edema -Appreciate input by Cardiology and Nephrology. Pt was continued on  IV lasix per nephrology with good urine output -Repeat Cr of 4.14. Discussed with Nephrology. Per Nephro, pt is OK to d/c home with close f/u visit with [facility] in 2-3 weeks   Anemia in chronic kidney disease (CKD) - Trend hemoglobin in setting of mild gross hematuria -hgb currently stable -initially held plavix given concerns of blood in urine. -Urine now more clear. Have resumed plavix   Hypokalemia - normalized   BPH (benign prostatic hyperplasia) Status post TURP 10/2, two days PTA by [name] -Continue cephalexin - Continue Percocet - Continue finasteride and Flomax - Urology had been following, appreciate assistance. Cath removed 10/7 per Urology   Coronary artery disease - Continue atorvastatin - Hold ACE inhibitor given renal function - Recheck cbc in AM -Plavix resumed     Acute renal failure superimposed on stage 4 chronic kidney disease ([place]) Creatinine up to 3.8 from baseline 2.6 at presentation -Holding lisinopril for now - Stopped gabapentin given worsening renal function, have discussed with family who states they will ensure pt holds off until OK to resume per Nephrology -Patient to follow up with Nephrology as outpatient     Hyperlipidemia - Continue atorvastatin   Essential hypertension Blood pressure elevated - Continue amlodipine, metoprolol, hydralazine - Continued IV Lasix per Nephrology, to continue 55m BID po lasix per home regimen per Nephrology - Hold ACE inhibitor for now given renal function   Type 2 diabetes mellitus with peripheral neuropathy (HCC) - Sliding scale correction insulin ordered -hypoglycemic this AM with glucose of 63. Decreased long-acting insulin to 10 units  GERD - Continue PPI   Mood disorder - Continue escitalopram   Diabetic neuropathy - Hold gabapentin - Continue nortriptyline       Consultants: Cardiology, Nephrology, Urology Procedures performed:   Disposition: Home Diet recommendation:  Carb modified  diet DISCHARGE MEDICATION: Allergies as of 10/22/2021       Reactions   Claritin [loratadine] Swelling   Joint swelling   Hydrochlorothiazide Other (See Comments)   Dizziness   Latex Hives   Lyrica [pregabalin] Other (See Comments)   depression   Metformin And Related Other (See Comments)   GI        Medication List     STOP taking these medications    cephALEXin 500 MG capsule Commonly known as: KEFLEX   gabapentin 300 MG capsule Commonly known as: NEURONTIN   lisinopril 10 MG tablet Commonly known as: ZESTRIL       TAKE these medications    acetaminophen 500 MG tablet Commonly known as: TYLENOL Take 1,000 mg by mouth every 6 (six) hours as needed for mild pain or headache.   amLODipine 10 MG tablet Commonly known as: NORVASC Take 1 tablet (10 mg total) by mouth daily.   atorvastatin 40 MG tablet Commonly known as: LIPITOR Take 1 tablet (40 mg total) by mouth daily.   Blood Pressure Monitor Devi Use as directed to check home blood pressure 2-3 times a week   clopidogrel 75 MG tablet Commonly known as: PLAVIX Take 1 tablet (75 mg total) by mouth daily.   clotrimazole 1 % cream Commonly known as: LOTRIMIN Apply 1 Application topically 2 (two) times daily as needed (rash).   D-Mannose 350 MG Caps Take 1,050 capsules by mouth in the morning and at bedtime.   docusate sodium 100 MG capsule Commonly known as: Colace Take 1 capsule (100 mg total) by mouth daily as needed for up to 30 doses.   escitalopram 20 MG tablet Commonly known as: LEXAPRO Take 1 tablet (20 mg total) by mouth daily.   famotidine 40 MG tablet Commonly known as: PEPCID Take 40 mg by mouth daily.   finasteride 5 MG tablet Commonly known as: PROSCAR Take 1 tablet (5 mg total) by mouth daily.   furosemide 40 MG tablet Commonly known as: Lasix Take 1 tablet (40 mg total) by mouth 2 (two) times daily.   hydrALAZINE 100 MG tablet Commonly known as: APRESOLINE Take 1 tablet  (100 mg total) by mouth 3 (three) times daily.   Lantus SoloStar 100 UNIT/ML Solostar Pen Generic drug: insulin glargine Inject 10 Units into the skin at bedtime. What changed: how much to take   meclizine 25 MG tablet Commonly known as: ANTIVERT Take 25 mg by mouth 3 (three) times daily as needed for dizziness.   metoCLOPramide 10 MG tablet Commonly known as: REGLAN Take 0.5 tablets (5 mg total) by mouth every 8 (eight) hours as needed for nausea or vomiting (Or headache).   metoprolol succinate 50 MG 24 hr tablet Commonly known as: TOPROL-XL Take 1 tablet (50 mg total) by mouth at bedtime. Take with or immediately following a meal.   nortriptyline 25 MG capsule Commonly known as: PAMELOR Take 1 capsule (25 mg total) by mouth at bedtime.   oxyCODONE-acetaminophen 5-325 MG tablet Commonly known as: Percocet Take 1 tablet by mouth every 4 (four) hours as needed for up to 18 doses for severe pain.   pantoprazole 40 MG tablet Commonly known as: PROTONIX Take 1 tablet (40 mg total) by mouth daily.  potassium chloride 10 MEQ tablet Commonly known as: KLOR-CON M Take 1 tablet (10 mEq total) by mouth daily.   sodium bicarbonate 650 MG tablet Take 650 mg by mouth 2 (two) times daily.   tamsulosin 0.4 MG Caps capsule Commonly known as: FLOMAX Take 0.4 mg by mouth at bedtime.   traZODone 50 MG tablet Commonly known as: DESYREL Take 3 tablets (150 mg total) by mouth at bedtime. What changed:  how much to take when to take this reasons to take this   triamcinolone ointment 0.5 % Commonly known as: KENALOG Apply 1 Application topically 2 (two) times daily as needed (rash).   True Metrix Blood Glucose Test test strip Generic drug: glucose blood Use as instructed   True Metrix Meter w/Device Kit Use as directed   TRUEplus Insulin Syringe 31G X 5/16" 0.3 ML Misc Generic drug: Insulin Syringe-Needle U-100 Use to inject Levemir at bedtime.   TRUEplus Lancets 28G  Misc Use as directed   Vitamin D3 1.25 MG (50000 UT) Caps Take 50,000 Units by mouth once a week. Notes to patient: As Scheduled        Follow-up Information     [name], MD Follow up in 2 week(s).   Specialty: Internal Medicine Why: Hospital follow up Contact information: [address] [place] [address] [phone]         [place] Follow up.   Why: Hospital follow up, as scheduled Contact information: [place] [address] [phone]         Janith Lima, MD Follow up.   Specialty: Urology Why: Hospital follow up, as scheduled Contact information: [place] [address] [phone]                Discharge Exam: Danley Danker Weights   10/20/21 0518 10/21/21 0528 10/22/21 0400  Weight: 105.6 kg 101.2 kg 101.7 kg   General exam: Awake, laying in bed, in nad Respiratory system: Normal respiratory effort, no wheezing Cardiovascular system: regular rate, s1, s2 Gastrointestinal system: Soft, nondistended, positive BS Central nervous system: CN2-12 grossly intact, strength intact Extremities: Perfused, no clubbing Skin: Normal skin turgor, no notable skin lesions seen Psychiatry: Mood normal // no visual hallucinations   Condition at discharge: fair  The results of significant diagnostics from this hospitalization (including imaging, microbiology, ancillary and laboratory) are listed below for reference.   Imaging Studies: US RENAL  Result Date: 10/20/2021 CLINICAL DATA:  Acute renal failure superimposed on chronic stage 4 kidney disease. EXAM: RENAL / URINARY TRACT ULTRASOUND COMPLETE COMPARISON:  Renal ultrasound 06/06/2021 FINDINGS: Right Kidney: Renal measurements: 11.3 x 4.7 x 4.9 cm = volume: 137 mL. Echogenicity is increased. No mass or hydronephrosis visualized. Left Kidney: Renal measurements: 10.6 x 5.6 x 4.3 cm = volume: 134 mL. Echogenicity is increased. No mass or  hydronephrosis visualized. Bladder: Bladder is decompressed by Foley catheter. Other: None. IMPRESSION: 1. Increased echogenicity of the bilateral kidneys as can be seen in medical renal disease. No hydronephrosis. 2. Bladder is decompressed by Foley catheter. Electronically Signed   By: Ronney Asters M.D.   On: 10/20/2021 20:29   DG CHEST PORT 1 VIEW  Result Date: 10/20/2021 CLINICAL DATA:  Congestive heart failure EXAM: PORTABLE CHEST 1 VIEW COMPARISON:  Chest x-ray dated October 18, 2021 FINDINGS: Unchanged heart size. Mild bilateral perihilar predominant heterogeneous opacities, increased when compared to prior exam. New small left pleural effusion. No evidence of pneumothorax. IMPRESSION:  1. Increased mild pulmonary edema. 2. New small left pleural effusion. Electronically Signed   By: Yetta Glassman M.D.   On: 10/20/2021 14:21   DG Chest Port 1 View  Result Date: 10/18/2021 CLINICAL DATA:  Cough, shortness of breath, prostate surgery 2 days ago; history coronary artery disease, hypertension, type II diabetes mellitus, CHF, stage IV chronic kidney disease EXAM: PORTABLE CHEST 1 VIEW COMPARISON:  Portable exam 0753 hours compared to 07/09/2021 FINDINGS: Borderline enlargement of cardiac silhouette. Mediastinal contours and pulmonary vascularity normal. Accentuation of perihilar interstitial markings with few Kerley B lines at lateral RIGHT lung base suggestive of minimal pulmonary edema. No segmental consolidation, pleural effusion, or pneumothorax. Osseous structures unremarkable. IMPRESSION: Accentuated perihilar markings and RIGHT basilar Kerley B-lines, question minimal pulmonary edema. Electronically Signed   By: Lavonia Dana M.D.   On: 10/18/2021 08:07    Microbiology: Results for orders placed or performed during the hospital encounter of 10/18/21  SARS Coronavirus 2 by RT PCR (hospital order, performed in [facility] hospital lab) *cepheid single result test* Anterior Nasal Swab     Status:  None   Collection Time: 10/18/21  1:33 PM   Specimen: Anterior Nasal Swab  Result Value Ref Range Status   SARS Coronavirus 2 by RT PCR NEGATIVE NEGATIVE Final    Comment: (NOTE) SARS-CoV-2 target nucleic acids are NOT DETECTED.  The SARS-CoV-2 RNA is generally detectable in upper and lower respiratory specimens during the acute phase of infection. The lowest concentration of SARS-CoV-2 viral copies this assay can detect is 250 copies / mL. A negative result does not preclude SARS-CoV-2 infection and should not be used as the sole basis for treatment or other patient management decisions.  A negative result may occur with improper specimen collection / handling, submission of specimen other than nasopharyngeal swab, presence of viral mutation(s) within the areas targeted by this assay, and inadequate number of viral copies (<250 copies / mL). A negative result must be combined with clinical observations, patient history, and epidemiological information.  Fact Sheet for Patients:   https://www.patel.info/  Fact Sheet for Healthcare Providers: https://hall.com/  This test is not yet approved or  cleared by the [place] FDA and has been authorized for detection and/or diagnosis of SARS-CoV-2 by FDA under an Emergency Use Authorization (EUA).  This EUA will remain in effect (meaning this test can be used) for the duration of the COVID-19 declaration under Section 564(b)(1) of the Act, 21 U.S.C. section 360bbb-3(b)(1), unless the authorization is terminated or revoked sooner.  Performed at [facility], [place] [address] [place], [place] [address]     Labs: CBC: Recent Labs  Lab 10/18/21 [phone] 10/19/21 0623 10/20/21 0646 10/21/21 0649 10/22/21 0719  WBC 8.9 6.9 7.0 7.0 6.1  NEUTROABS 6.2  --   --   --   --   HGB 9.1* 8.5* 8.3* 8.6* 8.9*  HCT 28.6* 26.3* 25.9* 26.4* 27.4*  MCV 92.6 91.0 91.8 91.7 90.4  PLT  352 331 328 329 443   Basic Metabolic Panel: Recent Labs  Lab 10/18/21 0737 10/19/21 0623 10/20/21 0646 10/21/21 0649 10/22/21 0719  NA 136 138 138 138 140  K 3.3* 3.5 4.5 4.4 4.0  CL 105 108 109 109 107  CO2 21* 21* _0 GLUCOSE 157* 77 62* 69* 78  BUN 49* 51* 55* 60* 50*  CREATININE 3.85* 3.56* 4.14* 4.06* 4.14*  CALCIUM 8.2* 8.2* 8.4* 8.5* 8.7*   Liver Function Tests: Recent Labs  Lab 10/18/21 0737 10/20/21 0646  AST 27 12*  ALT 12 7  ALKPHOS 98 91  BILITOT 0.6 0.7  PROT 7.6 7.0  ALBUMIN 3.4* 3.1*   CBG: Recent Labs  Lab 10/21/21 1609 10/21/21 2222 10/22/21 0356 10/22/21 0809 10/22/21 1119  GLUCAP 99 112* 105* 74 164*    Discharge time spent: less than 30 minutes.  Signed: Marylu Lund, MD [facility] 10/22/2021

## 2021-10-22 NOTE — Progress Notes (Signed)
Mobility Specialist - Progress Note   10/22/21 1014  Mobility  Activity Ambulated with assistance in hallway  Activity Response Tolerated well  Distance Ambulated (ft) 160 ft  $Mobility charge 1 Mobility  Level of Assistance Contact guard assist, steadying assist  Assistive Device Front wheel walker  [facility] Elevated/Bed Position Self regulated  Range of Motion/Exercises Active  Transport method Ambulatory  Mobility Referral Yes   Pt received in bed and agreeable to mobility. Pt a little dizzy from laying to sitting up, but was able to recover & ambulate. No complaints during ambulation. C/o feeling itchy towards EOS; nurse notified. Pt to recliner after session with all needs met.    [facility]

## 2021-10-22 NOTE — Progress Notes (Signed)
Patient discharged to home with family, discharge instructions reviewed with patient who verbalized understanding. 

## 2021-10-22 NOTE — Progress Notes (Signed)
Progress Note  Patient Name: Ryan Jenkins Date of Encounter: 10/22/2021  Primary Cardiologist:   None   Subjective   Denies chest pain or new SOB.    Inpatient Medications    Scheduled Meds:  amLODipine  10 mg Oral Daily   atorvastatin  40 mg Oral Daily   cephALEXin  500 mg Oral BID   Chlorhexidine Gluconate Cloth  6 each Topical Daily   clopidogrel  75 mg Oral Daily   enoxaparin (LOVENOX) injection  30 mg Subcutaneous Q24H   escitalopram  20 mg Oral Daily   finasteride  5 mg Oral Daily   hydrALAZINE  100 mg Oral TID   insulin aspart  0-15 Units Subcutaneous TID WC   insulin aspart  0-5 Units Subcutaneous QHS   insulin glargine-yfgn  10 Units Subcutaneous QHS   metoprolol succinate  50 mg Oral QHS   nortriptyline  25 mg Oral QHS   pantoprazole  40 mg Oral Daily   tamsulosin  0.4 mg Oral QHS   Continuous Infusions:  PRN Meds: acetaminophen **OR** acetaminophen, ondansetron **OR** ondansetron (ZOFRAN) IV, oxyCODONE-acetaminophen, traZODone   Vital Signs    Vitals:   10/21/21 0528 10/21/21 1355 10/21/21 1949 10/22/21 0400  BP: (!) 151/78 (!) 160/74 (!) 148/70 (!) 146/79  Pulse: 76 75 81 73  Resp: '18 18 20 17  '$ Temp: 99.3 F (37.4 C) 98.7 F (37.1 C) 98.5 F (36.9 C) 98.6 F (37 C)  TempSrc: Oral Oral Oral Oral  SpO2: 93% 99% 96% 99%  Weight: 101.2 kg   101.7 kg  Height:        Intake/Output Summary (Last 24 hours) at 10/22/2021 0843 Last data filed at 10/22/2021 0404 Gross per 24 hour  Intake --  Output 3575 ml  Net -3575 ml   Filed Weights   10/20/21 0518 10/21/21 0528 10/22/21 0400  Weight: 105.6 kg 101.2 kg 101.7 kg    Telemetry    NSR - Personally Reviewed  ECG    NA - Personally Reviewed  Physical Exam   GEN: No  acute distress.   Neck: No  JVD Cardiac: RRR, no murmurs, rubs, or gallops.  Respiratory: Clear   to auscultation bilaterally. GI: Soft, nontender, non-distended, normal bowel sounds  MS:   No edema; No  deformity. Neuro:   Nonfocal  Psych: Oriented and appropriate    Labs    Chemistry Recent Labs  Lab 10/18/21 0737 10/19/21 0623 10/20/21 0646 10/21/21 0649 10/22/21 0719  NA 136   < > 138 138 140  K 3.3*   < > 4.5 4.4 4.0  CL 105   < > 109 109 107  CO2 21*   < > '22 22 23  '$ GLUCOSE 157*   < > 62* 69* 78  BUN 49*   < > 55* 60* 50*  CREATININE 3.85*   < > 4.14* 4.06* 4.14*  CALCIUM 8.2*   < > 8.4* 8.5* 8.7*  PROT 7.6  --  7.0  --   --   ALBUMIN 3.4*  --  3.1*  --   --   AST 27  --  12*  --   --   ALT 12  --  7  --   --   ALKPHOS 98  --  91  --   --   BILITOT 0.6  --  0.7  --   --   GFRNONAA 17*   < > 16* 16* 16*  ANIONGAP 10   < >  $'7 7 10   'A$ < > = values in this interval not displayed.     Hematology Recent Labs  Lab 10/20/21 0646 10/21/21 0649 10/22/21 0719  WBC 7.0 7.0 6.1  RBC 2.82* 2.88* 3.03*  HGB 8.3* 8.6* 8.9*  HCT 25.9* 26.4* 27.4*  MCV 91.8 91.7 90.4  MCH 29.4 29.9 29.4  MCHC 32.0 32.6 32.5  RDW 15.3 15.1 14.9  PLT 328 329 368    Cardiac EnzymesNo results for input(s): "TROPONINI" in the last 168 hours. No results for input(s): "TROPIPOC" in the last 168 hours.   BNP Recent Labs  Lab 10/18/21 0737  BNP 1,009.0*     DDimer No results for input(s): "DDIMER" in the last 168 hours.   Radiology    US RENAL  Result Date: 10/20/2021 CLINICAL DATA:  Acute renal failure superimposed on chronic stage 4 kidney disease. EXAM: RENAL / URINARY TRACT ULTRASOUND COMPLETE COMPARISON:  Renal ultrasound 06/06/2021 FINDINGS: Right Kidney: Renal measurements: 11.3 x 4.7 x 4.9 cm = volume: 137 mL. Echogenicity is increased. No mass or hydronephrosis visualized. Left Kidney: Renal measurements: 10.6 x 5.6 x 4.3 cm = volume: 134 mL. Echogenicity is increased. No mass or hydronephrosis visualized. Bladder: Bladder is decompressed by Foley catheter. Other: None. IMPRESSION: 1. Increased echogenicity of the bilateral kidneys as can be seen in medical renal disease. No  hydronephrosis. 2. Bladder is decompressed by Foley catheter. Electronically Signed   By: Ronney Asters M.D.   On: 10/20/2021 20:29   DG CHEST PORT 1 VIEW  Result Date: 10/20/2021 CLINICAL DATA:  Congestive heart failure EXAM: PORTABLE CHEST 1 VIEW COMPARISON:  Chest x-ray dated October 18, 2021 FINDINGS: Unchanged heart size. Mild bilateral perihilar predominant heterogeneous opacities, increased when compared to prior exam. New small left pleural effusion. No evidence of pneumothorax. IMPRESSION: 1. Increased mild pulmonary edema. 2. New small left pleural effusion. Electronically Signed   By: Yetta Glassman M.D.   On: 10/20/2021 14:21    Cardiac Studies   ECHO: 06/05/21  1. Left ventricular ejection fraction, by estimation, is 60 to 65%. The left ventricle has normal function. The left ventricle has no regional wall motion abnormalities. There is moderate concentric left ventricular hypertrophy. Left ventricular diastolic parameters are consistent with Grade II diastolic dysfunction  (pseudonormalization).   2. Right ventricular systolic function is normal. The right ventricular size is normal.   3. The mitral valve is normal in structure. Trivial mitral valve  regurgitation.   4. The aortic valve is tricuspid. There is mild calcification of the  aortic valve. There is mild thickening of the aortic valve. Aortic valve regurgitation is not visualized. Aortic valve sclerosis/calcification is present, without any evidence of  aortic stenosis.   5. The inferior vena cava is normal in size with greater than 50%  respiratory variability, suggesting right atrial pressure of 3 mmHg.    Patient Profile     61 y.o. male with a hx of poorly controlled diabetes, HTN, HLD, CKD IV, HFpEF, diabetic polyneuropathy, Hx acute hypoxic respiratory failure with ARDS 10/2019 who is being seen 10/20/2021 for the evaluation of CHF at the request of Dr. Wyline Copas.   Assessment & Plan    Acute on chronic diastolic  congestive heart failure-patient:   Net negative 7.8 liters.  He did get two doses of IV Lasix yesterday.  Plans per nephrology.   He will go back on PO diuretic today.  Not a candidate for ARB/ARNi and would avoid GLP1ra at this time.  Acute on chronic stage IV kidney disease:  Creat is stable to slightly increased.  Plan per renal  Hypertension:  BP is mildly elevated.  Metoprolol increased.   History of moderate stenosis in the right coronary artery:   Medical management with this Plavix held.    Hyperlipidemia:   Dyslipidemia.   Continue Lipitor.  Status post TURP   For questions or updates, please contact [facility] [place] Please consult [email or web address] for contact info under Cardiology/STEMI.   Signed, Minus Breeding, MD  10/22/2021, 8:43 AM

## 2021-10-31 ENCOUNTER — Other Ambulatory Visit: Payer: Self-pay

## 2021-10-31 ENCOUNTER — Emergency Department (HOSPITAL_COMMUNITY): Payer: Medicaid Other

## 2021-10-31 ENCOUNTER — Emergency Department (HOSPITAL_COMMUNITY)
Admission: EM | Admit: 2021-10-31 | Discharge: 2021-10-31 | Disposition: A | Discharge status: Home / Self Care | Payer: Medicaid Other | Attending: Emergency Medicine | Admitting: Emergency Medicine

## 2021-10-31 DIAGNOSIS — Z91040 Latex allergy status: Secondary | ICD-10-CM | POA: Insufficient documentation

## 2021-10-31 DIAGNOSIS — R339 Retention of urine, unspecified: Secondary | ICD-10-CM | POA: Diagnosis present

## 2021-10-31 DIAGNOSIS — Z794 Long term (current) use of insulin: Secondary | ICD-10-CM | POA: Insufficient documentation

## 2021-10-31 DIAGNOSIS — I10 Essential (primary) hypertension: Secondary | ICD-10-CM

## 2021-10-31 DIAGNOSIS — Z7902 Long term (current) use of antithrombotics/antiplatelets: Secondary | ICD-10-CM | POA: Diagnosis not present

## 2021-10-31 DIAGNOSIS — N390 Urinary tract infection, site not specified: Secondary | ICD-10-CM | POA: Diagnosis not present

## 2021-10-31 DIAGNOSIS — R1030 Lower abdominal pain, unspecified: Secondary | ICD-10-CM | POA: Insufficient documentation

## 2021-10-31 DIAGNOSIS — Z79899 Other long term (current) drug therapy: Secondary | ICD-10-CM | POA: Diagnosis not present

## 2021-10-31 LAB — CBC WITH DIFFERENTIAL/PLATELET
Abs Immature Granulocytes: 0.02 10*3/uL (ref 0.00–0.07)
Basophils Absolute: 0.1 10*3/uL (ref 0.0–0.1)
Basophils Relative: 1 %
Eosinophils Absolute: 0.2 10*3/uL (ref 0.0–0.5)
Eosinophils Relative: 3 %
HCT: 32.8 % — ABNORMAL LOW (ref 39.0–52.0)
Hemoglobin: 10.8 g/dL — ABNORMAL LOW (ref 13.0–17.0)
Immature Granulocytes: 0 %
Lymphocytes Relative: 22 %
Lymphs Abs: 1.6 10*3/uL (ref 0.7–4.0)
MCH: 29.1 pg (ref 26.0–34.0)
MCHC: 32.9 g/dL (ref 30.0–36.0)
MCV: 88.4 fL (ref 80.0–100.0)
Monocytes Absolute: 0.5 10*3/uL (ref 0.1–1.0)
Monocytes Relative: 7 %
Neutro Abs: 5 10*3/uL (ref 1.7–7.7)
Neutrophils Relative %: 67 %
Platelets: 428 10*3/uL — ABNORMAL HIGH (ref 150–400)
RBC: 3.71 MIL/uL — ABNORMAL LOW (ref 4.22–5.81)
RDW: 14.1 % (ref 11.5–15.5)
WBC: 7.5 10*3/uL (ref 4.0–10.5)
nRBC: 0 % (ref 0.0–0.2)

## 2021-10-31 LAB — BRAIN NATRIURETIC PEPTIDE: B Natriuretic Peptide: 550.3 pg/mL — ABNORMAL HIGH (ref 0.0–100.0)

## 2021-10-31 LAB — TROPONIN I (HIGH SENSITIVITY)
Troponin I (High Sensitivity): 33 ng/L — ABNORMAL HIGH (ref <18)
Troponin I (High Sensitivity): 36 ng/L — ABNORMAL HIGH (ref <18)

## 2021-10-31 LAB — COMPREHENSIVE METABOLIC PANEL
ALT: 9 U/L (ref 0–44)
AST: 16 U/L (ref 15–41)
Albumin: 3.6 g/dL (ref 3.5–5.0)
Alkaline Phosphatase: 77 U/L (ref 38–126)
Anion gap: 8 (ref 5–15)
BUN: 25 mg/dL — ABNORMAL HIGH (ref 8–23)
CO2: 21 mmol/L — ABNORMAL LOW (ref 22–32)
Calcium: 8.8 mg/dL — ABNORMAL LOW (ref 8.9–10.3)
Chloride: 109 mmol/L (ref 98–111)
Creatinine, Ser: 2.94 mg/dL — ABNORMAL HIGH (ref 0.61–1.24)
GFR, Estimated: 23 mL/min — ABNORMAL LOW (ref >60)
Glucose, Bld: 149 mg/dL — ABNORMAL HIGH (ref 70–99)
Potassium: 3.8 mmol/L (ref 3.5–5.1)
Sodium: 138 mmol/L (ref 135–145)
Total Bilirubin: 0.7 mg/dL (ref 0.3–1.2)
Total Protein: 8.1 g/dL (ref 6.5–8.1)

## 2021-10-31 LAB — URINALYSIS, ROUTINE W REFLEX MICROSCOPIC
Bacteria, UA: NONE SEEN
Bilirubin Urine: NEGATIVE
Glucose, UA: 50 mg/dL — AB
Ketones, ur: NEGATIVE mg/dL
Nitrite: NEGATIVE
Protein, ur: >=300 mg/dL — AB
RBC / HPF: >50 RBC/hpf — ABNORMAL HIGH (ref 0–5)
Specific Gravity, Urine: 1.015 (ref 1.005–1.030)
WBC, UA: >50 WBC/hpf — ABNORMAL HIGH (ref 0–5)
pH: 6 (ref 5.0–8.0)

## 2021-10-31 MED ORDER — SODIUM CHLORIDE 0.9 % IV BOLUS
500.0000 mL | Freq: Once | INTRAVENOUS | Status: AC
  Administered 2021-10-31: 500 mL via INTRAVENOUS

## 2021-10-31 MED ORDER — AMLODIPINE BESYLATE 5 MG PO TABS
10.0000 mg | ORAL_TABLET | Freq: Once | ORAL | Status: AC
  Administered 2021-10-31: 10 mg via ORAL
  Filled 2021-10-31: qty 2

## 2021-10-31 MED ORDER — CEPHALEXIN 500 MG PO CAPS: 500.0000 mg | ORAL_CAPSULE | Freq: Three times a day (TID) | ORAL | 0 refills | Status: DC

## 2021-10-31 MED ORDER — HYDRALAZINE HCL 20 MG/ML IJ SOLN
10.0000 mg | Freq: Once | INTRAMUSCULAR | Status: AC
  Administered 2021-10-31: 10 mg via INTRAVENOUS
  Filled 2021-10-31: qty 1

## 2021-10-31 MED ORDER — SODIUM CHLORIDE 0.9 % IV SOLN
1.0000 g | Freq: Once | INTRAVENOUS | Status: AC
  Administered 2021-10-31: 1 g via INTRAVENOUS
  Filled 2021-10-31: qty 10

## 2021-10-31 NOTE — ED Notes (Signed)
Dr. Darl Householder at the bedside to give pt update of troponin results and POC from here

## 2021-10-31 NOTE — ED Notes (Signed)
Pt was changed from his wet brief and pants, pt had his own clean brief that this RN and Faith NT changed pt into, also removed pt's sweat pants and supplied pt with blue paper scrubs. Pt reposition in bed, pt given two additional clean warm blankets. Pt upset his call bell was "unplug form the wall" pt stated "the day shift personal did that". Advised pt it was not pulled out the wall, his call bell was beside him in the bed next to rail, this RN clip the call bell to his pillow on the right side and place call bell on his abd as requested. Pt given his phone as well. Pt requst number to call to file complain as he fells "the dayshift staff was not taking care of me, they let me urinate on myself, I could use the call bell to call for help, I had to scream out the room to get someone to notice" This RN apologize to pt for his experience earlier today, reassured pt this will not happen again, I am his new nurse coming on to shift and will be taken care of him, if he has any further concerns or complaint I will gladly try to be some assist to best of my abilities. Pt grateful, no other needs or concerns at this time. Advised Dr. Darl Householder will be in shortly to provide update once his 2nd troponin resulted. Pt verbalized understanding. Call bell within reach, TV on for comfort, pt has personal cell phone in hand.

## 2021-10-31 NOTE — ED Notes (Signed)
Dr. Darl Householder notified of pt's BP elevated SBP 190-200 & DBP 100-110, pt is up dor discharge at this time, advised Dr. Darl Householder of trending HTN, question if pt should be d/c and need to get BP under control before he his d/c. Pt has not taken his BP medication as well today. Awaiting response.

## 2021-10-31 NOTE — ED Provider Notes (Signed)
[facility] DEPT Provider Note   CSN: [id number] Arrival date & time: 10/31/21  1600     History  Chief Complaint  Patient presents with   Urinary Retention    Ryan Jenkins is a 61 y.o. male here presenting with decreased urine output.  Patient was recently admitted to the hospital for CHF exacerbation.  Patient is still on Lasix 40 mg twice daily.  Patient states that he has not been able to urinate since yesterday.  He states that he has some pain when he does urinate.  He had a Foley placed in the hospital but that was taken out before he was discharged about a week ago.  Patient states that he has enlarged prostate.  He states that he had Foley's multiple times before.  He states that his leg swelling has decreased and he has lost some weight.  The history is provided by the patient.       Home Medications Prior to Admission medications   Medication Sig Start Date End Date Taking? Authorizing Provider  acetaminophen (TYLENOL) 500 MG tablet Take 1,000 mg by mouth every 6 (six) hours as needed for mild pain or headache.    [provider]  amLODipine (NORVASC) 10 MG tablet Take 1 tablet (10 mg total) by mouth daily. 02/08/21   Danford, Suann Larry, MD  atorvastatin (LIPITOR) 40 MG tablet Take 1 tablet (40 mg total) by mouth daily. 02/08/21   Danford, Suann Larry, MD  Blood Glucose Monitoring Suppl (TRUE METRIX METER) w/Device KIT Use as directed 07/20/20   Ladell Pier, MD  Blood Pressure Monitor DEVI Use as directed to check home blood pressure 2-3 times a week 07/13/20   Camillia Herter, NP  Cholecalciferol (VITAMIN D3) 1.25 MG (50000 UT) CAPS Take 50,000 Units by mouth once a week. 09/12/21   [provider]  clopidogrel (PLAVIX) 75 MG tablet Take 1 tablet (75 mg total) by mouth daily. 07/05/21   de Yolanda Manges, Cortney E, NP  clotrimazole (LOTRIMIN) 1 % cream Apply 1 Application topically 2 (two) times daily as needed (rash).  08/01/21   [provider]  D-Mannose 350 MG CAPS Take 1,050 capsules by mouth in the morning and at bedtime.    [provider]  docusate sodium (COLACE) 100 MG capsule Take 1 capsule (100 mg total) by mouth daily as needed for up to 30 doses. 10/16/21   Janith Lima, MD  escitalopram (LEXAPRO) 20 MG tablet Take 1 tablet (20 mg total) by mouth daily. 07/20/20   Charlott Rakes, MD  famotidine (PEPCID) 40 MG tablet Take 40 mg by mouth daily. 08/08/21   [provider]  finasteride (PROSCAR) 5 MG tablet Take 1 tablet (5 mg total) by mouth daily. 07/20/20   Charlott Rakes, MD  furosemide (LASIX) 40 MG tablet Take 1 tablet (40 mg total) by mouth 2 (two) times daily. 02/08/21   Danford, Suann Larry, MD  glucose blood (TRUE METRIX BLOOD GLUCOSE TEST) test strip Use as instructed 07/20/20   Ladell Pier, MD  hydrALAZINE (APRESOLINE) 100 MG tablet Take 1 tablet (100 mg total) by mouth 3 (three) times daily. 02/08/21   Danford, Suann Larry, MD  Insulin Syringe-Needle U-100 (TRUEPLUS INSULIN SYRINGE) 31G X 5/16" 0.3 ML MISC Use to inject Levemir at bedtime. 07/20/20   Charlott Rakes, MD  LANTUS SOLOSTAR 100 UNIT/ML Solostar Pen Inject 10 Units into the skin at bedtime. 10/22/21   Donne Hazel, MD  meclizine (ANTIVERT) 25 MG tablet Take 25 mg by mouth 3 (three) times daily as needed for dizziness. 08/08/21   [provider]  metoCLOPramide (REGLAN) 10 MG tablet Take 0.5 tablets (5 mg total) by mouth every 8 (eight) hours as needed for nausea or vomiting (Or headache). 07/09/21   Godfrey Pick, MD  metoprolol succinate (TOPROL-XL) 50 MG 24 hr tablet Take 1 tablet (50 mg total) by mouth at bedtime. Take with or immediately following a meal. 02/08/21   Danford, Suann Larry, MD  nortriptyline (PAMELOR) 25 MG capsule Take 1 capsule (25 mg total) by mouth at bedtime. 07/20/20   Charlott Rakes, MD  oxyCODONE-acetaminophen (PERCOCET) 5-325 MG tablet Take 1 tablet by mouth every 4  (four) hours as needed for up to 18 doses for severe pain. 10/16/21   Janith Lima, MD  pantoprazole (PROTONIX) 40 MG tablet Take 1 tablet (40 mg total) by mouth daily. 02/08/21   Danford, Suann Larry, MD  potassium chloride (KLOR-CON M) 10 MEQ tablet Take 1 tablet (10 mEq total) by mouth daily. 02/08/21   Danford, Suann Larry, MD  sodium bicarbonate 650 MG tablet Take 650 mg by mouth 2 (two) times daily. 09/20/21   [provider]  tamsulosin (FLOMAX) 0.4 MG CAPS capsule Take 0.4 mg by mouth at bedtime. 05/25/21   [provider]  traZODone (DESYREL) 50 MG tablet Take 3 tablets (150 mg total) by mouth at bedtime. Patient taking differently: Take 50 mg by mouth at bedtime as needed for sleep. 07/20/20   Charlott Rakes, MD  triamcinolone ointment (KENALOG) 0.5 % Apply 1 Application topically 2 (two) times daily as needed (rash). 08/31/21   [provider]  TRUEplus Lancets 28G MISC Use as directed 07/20/20   Ladell Pier, MD      Allergies    Claritin [loratadine], Hydrochlorothiazide, Latex, Lyrica [pregabalin], and Metformin and related    Review of Systems   Review of Systems  Genitourinary:  Positive for difficulty urinating and dysuria.  All other systems reviewed and are negative.   Physical Exam Updated Vital Signs BP (!) 189/107   Pulse 91   Temp 97.6 F (36.4 C) (Oral)   Resp 15   Ht 6' 2"  (1.88 m)   Wt 93.4 kg   SpO2 100%   BMI 26.45 kg/m  Physical Exam Vitals and nursing note reviewed.  Constitutional:      Comments: Chronically ill, slightly uncomfortable  HENT:     Head: Normocephalic.     Nose: Nose normal.     Mouth/Throat:     Mouth: Mucous membranes are moist.  Eyes:     Extraocular Movements: Extraocular movements intact.     Pupils: Pupils are equal, round, and reactive to light.  Cardiovascular:     Rate and Rhythm: Normal rate and regular rhythm.     Pulses: Normal pulses.     Heart sounds: Normal heart sounds.   Pulmonary:     Effort: Pulmonary effort is normal.     Breath sounds: Normal breath sounds.  Abdominal:     General: Abdomen is flat.     Palpations: Abdomen is soft.     Comments: Mild suprapubic tenderness  Musculoskeletal:        General: Normal range of motion.     Cervical back: Normal range of motion and neck supple.  Skin:    General: Skin is warm.     Capillary Refill: Capillary refill takes less than 2 seconds.  Neurological:  General: No focal deficit present.     Mental Status: He is oriented to person, place, and time.  Psychiatric:        Mood and Affect: Mood normal.        Behavior: Behavior normal.     ED Results / Procedures / Treatments   Labs (all labs ordered are listed, but only abnormal results are displayed) Labs Reviewed  CBC WITH DIFFERENTIAL/PLATELET - Abnormal; Notable for the following components:      Result Value   RBC 3.71 (*)    Hemoglobin 10.8 (*)    HCT 32.8 (*)    Platelets 428 (*)    All other components within normal limits  COMPREHENSIVE METABOLIC PANEL - Abnormal; Notable for the following components:   CO2 21 (*)    Glucose, Bld 149 (*)    BUN 25 (*)    Creatinine, Ser 2.94 (*)    Calcium 8.8 (*)    GFR, Estimated 23 (*)    All other components within normal limits  BRAIN NATRIURETIC PEPTIDE - Abnormal; Notable for the following components:   B Natriuretic Peptide 550.3 (*)    All other components within normal limits  URINALYSIS, ROUTINE W REFLEX MICROSCOPIC - Abnormal; Notable for the following components:   Color, Urine RED (*)    APPearance CLOUDY (*)    Glucose, UA 50 (*)    Hgb urine dipstick LARGE (*)    Protein, ur >=300 (*)    Leukocytes,Ua LARGE (*)    RBC / HPF >50 (*)    WBC, UA >50 (*)    All other components within normal limits  TROPONIN I (HIGH SENSITIVITY) - Abnormal; Notable for the following components:   Troponin I (High Sensitivity) 33 (*)    All other components within normal limits  TROPONIN I  (HIGH SENSITIVITY) - Abnormal; Notable for the following components:   Troponin I (High Sensitivity) 36 (*)    All other components within normal limits  URINE CULTURE    EKG EKG Interpretation  Date/Time:  Tuesday October 31 2021 18:24:08 EDT Ventricular Rate:  88 PR Interval:  185 QRS Duration: 108 QT Interval:  400 QTC Calculation: 484 R Axis:   -23 Text Interpretation: Sinus rhythm Borderline left axis deviation Probable anteroseptal infarct, recent Lateral leads are also involved No significant change since last tracing Confirmed by Wandra Arthurs ([id number]) on 10/31/2021 6:36:01 PM  Radiology CT Renal Stone Study  Result Date: 10/31/2021 CLINICAL DATA:  Flank pain, kidney stone suspected. Urinary retention. EXAM: CT ABDOMEN AND PELVIS WITHOUT CONTRAST TECHNIQUE: Multidetector CT imaging of the abdomen and pelvis was performed following the standard protocol without IV contrast. RADIATION DOSE REDUCTION: This exam was performed according to the departmental dose-optimization program which includes automated exposure control, adjustment of the mA and/or kV according to patient size and/or use of iterative reconstruction technique. COMPARISON:  07/06/2020 FINDINGS: Lower chest: No acute abnormality. Hepatobiliary: No focal liver abnormality is seen. No gallstones, gallbladder wall thickening, or biliary dilatation. Pancreas: Unremarkable Spleen: Unremarkable Adrenals/Urinary Tract: The adrenal glands are unremarkable. The bladder is circumferentially thick walled and there is mild perivesicular inflammatory stranding. There is mild bilateral perinephric inflammatory stranding. The findings are nonspecific but can be seen the setting of infectious pyelonephritis/cystitis. No hydronephrosis. No intrarenal or ureteral calculi. The kidneys are normal in size and position. The bladder is decompressed. Stomach/Bowel: Stomach is within normal limits. Appendix appears normal. No evidence of bowel wall  thickening, distention, or inflammatory changes.  Mild sigmoid diverticulosis noted. Vascular/Lymphatic: Moderate visceral arteriosclerosis. No aortic aneurysm. No pathologic adenopathy within the abdomen and pelvis. Reproductive: Defect within the central prostate gland may reflect changes of trans urethral resection. Calcification of the vas deferens noted in keeping with longstanding changes of diabetes mellitus. Other: No abdominal wall hernia or abnormality. No abdominopelvic ascites. Musculoskeletal: Osseous structures are age-appropriate. No acute bone abnormality. No lytic or blastic bone lesion. IMPRESSION: 1. Circumferential bladder wall thickening with mild perivesicular inflammatory stranding and mild bilateral perinephric inflammatory stranding. While nonspecific, this can be seen the setting of infectious pyelonephritis/cystitis. Correlation with urinalysis and urine culture may be helpful. No urolithiasis. No hydronephrosis. 2. Mild sigmoid diverticulosis. Electronically Signed   By: Fidela Salisbury M.D.   On: 10/31/2021 19:47    Procedures Procedures    Medications Ordered in ED Medications  sodium chloride 0.9 % bolus 500 mL (500 mLs Intravenous New Bag/Given 10/31/21 1647)  cefTRIAXone (ROCEPHIN) 1 g in sodium chloride 0.9 % 100 mL IVPB (1 g Intravenous New Bag/Given 10/31/21 1913)    ED Course/ Medical Decision Making/ A&P                           Medical Decision Making Ryan Jenkins is a 61 y.o. male here presenting with dysuria.  Patient has a postvoid residual of about 80 cc.  I also perform a bedside ultrasound and his bladder volume is around 80 cc but the bladder wall appears thickened consistent with possible UTI.  Patient also lost about 10 pounds since last admission so I think he likely is dehydrated.  We will recheck kidney function and do 500 cc bolus and reassess.  7:56 PM I reviewed patient's labs and independently interpreted imaging studies.  Patient's UA  showed greater than 50 white blood cell and red blood cells.  CT does not show any obstructing stone.  Creatinine is 2.9 which is improved from baseline.  BNP is 550 which is also improved from 2 weeks ago.  Patient urinated multiple times in the ED.  Patient was given Rocephin and will be discharged home with a course of Keflex   Problems Addressed: Urinary tract infection with hematuria, site unspecified: acute illness or injury  Amount and/or Complexity of Data Reviewed Labs: ordered. Decision-making details documented in ED Course. Radiology: ordered and independent interpretation performed. Decision-making details documented in ED Course. ECG/medicine tests: ordered and independent interpretation performed. Decision-making details documented in ED Course.    Final Clinical Impression(s) / ED Diagnoses Final diagnoses:  None    Rx / DC Orders ED Discharge Orders     None         Drenda Freeze, MD 10/31/21 1958

## 2021-10-31 NOTE — ED Notes (Signed)
Urine requested  ?

## 2021-10-31 NOTE — ED Notes (Signed)
Per Dr. Darl Householder "after dose of his hydralazine and norvasc he can go, he has been running that and didn't get to take his bp meds" This RN to administer pt''s BP meds, refer to [facility], will also educate pt on taking his BP meds as schedule.

## 2021-10-31 NOTE — Discharge Instructions (Addendum)
Take keflex three times daily for a week for urinary tract infection  See your doctor for follow-up.  Take your blood pressure medicines as prescribed  Return to ER if you have worse trouble urinating, severe pain, fever, vomiting

## 2021-10-31 NOTE — ED Triage Notes (Signed)
Ems brings pt in from home. Pt states he feels like he is having urinary retention that started last night.

## 2021-11-02 LAB — URINE CULTURE: Culture: 20000 — AB

## 2021-11-03 ENCOUNTER — Telehealth (HOSPITAL_BASED_OUTPATIENT_CLINIC_OR_DEPARTMENT_OTHER): Payer: Self-pay | Admitting: Emergency Medicine

## 2021-11-03 NOTE — Telephone Encounter (Signed)
Post ED Visit - Positive Culture Follow-up  Culture report reviewed by antimicrobial stewardship pharmacist: [facility] Team '[]'$  Elenor Quinones, Pharm.D. '[]'$  Heide Guile, Pharm.D., BCPS AQ-ID '[]'$  Parks Neptune, Pharm.D., BCPS '[]'$  Alycia Rossetti, Pharm.D., BCPS '[]'$  [place], [place].D., BCPS, AAHIVP '[]'$  Legrand Como, Pharm.D., BCPS, AAHIVP '[]'$  Salome Arnt, PharmD, BCPS '[]'$  Johnnette Gourd, PharmD, BCPS '[]'$  Hughes Better, PharmD, BCPS '[]'$  Leeroy Cha, PharmD '[]'$  Laqueta Linden, PharmD, BCPS '[]'$  Albertina Parr, PharmD  [place] Team '[]'$  Leodis Sias, PharmD '[]'$  Lindell Spar, PharmD '[]'$  Royetta Asal, PharmD '[]'$  Graylin Shiver, Rph '[]'$  Rema Fendt) Glennon Mac, PharmD '[]'$  Arlyn Dunning, PharmD '[]'$  Netta Cedars, PharmD '[]'$  Dia Sitter, PharmD '[]'$  Leone Haven, PharmD '[]'$  Gretta Arab, PharmD '[]'$  Theodis Shove, PharmD '[]'$  Peggyann Juba, PharmD '[]'$  Reuel Boom, PharmD Francena Hanly PharmD   Positive urine culture Treated with cephalexin, organism sensitive to the same and no further patient follow-up is required at this time.  Hazle Nordmann 11/03/2021, 10:33 AM

## 2021-11-03 NOTE — Progress Notes (Signed)
ED Antimicrobial Stewardship Positive Culture Follow Up   Ryan Jenkins is an 61 y.o. male who presented to [facility] on 10/31/2021 with a chief complaint of  Chief Complaint  Patient presents with   Urinary Retention    Recent Results (from the past 720 hour(s))  SARS Coronavirus 2 by RT PCR (hospital order, performed in [facility] hospital lab) *cepheid single result test* Anterior Nasal Swab     Status: None   Collection Time: 10/18/21  1:33 PM   Specimen: Anterior Nasal Swab  Result Value Ref Range Status   SARS Coronavirus 2 by RT PCR NEGATIVE NEGATIVE Final    Comment: (NOTE) SARS-CoV-2 target nucleic acids are NOT DETECTED.  The SARS-CoV-2 RNA is generally detectable in upper and lower respiratory specimens during the acute phase of infection. The lowest concentration of SARS-CoV-2 viral copies this assay can detect is 250 copies / mL. A negative result does not preclude SARS-CoV-2 infection and should not be used as the sole basis for treatment or other patient management decisions.  A negative result may occur with improper specimen collection / handling, submission of specimen other than nasopharyngeal swab, presence of viral mutation(s) within the areas targeted by this assay, and inadequate number of viral copies (<250 copies / mL). A negative result must be combined with clinical observations, patient history, and epidemiological information.  Fact Sheet for Patients:   https://www.patel.info/  Fact Sheet for Healthcare Providers: https://hall.com/  This test is not yet approved or  cleared by the [place] FDA and has been authorized for detection and/or diagnosis of SARS-CoV-2 by FDA under an Emergency Use Authorization (EUA).  This EUA will remain in effect (meaning this test can be used) for the duration of the COVID-19 declaration under Section 564(b)(1) of the Act, 21 U.S.C. section 360bbb-3(b)(1), unless  the authorization is terminated or revoked sooner.  Performed at [facility], [place] [address] [place], [place] [address]   Urine Culture     Status: Abnormal   Collection Time: 10/31/21  5:12 PM   Specimen: Urine, Clean Catch  Result Value Ref Range Status   Specimen Description   Final    URINE, CLEAN CATCH Performed at [facility], [place] [address] [place], [place] [address]    Special Requests   Final    NONE Performed at [facility], [place] [address], [place], [place] [address]    Culture (A)  Final    20,000 COLONIES/mL KLEBSIELLA PNEUMONIAE Confirmed Extended Spectrum Beta-Lactamase Producer (ESBL).  In bloodstream infections from ESBL organisms, carbapenems are preferred over piperacillin/tazobactam. They are shown to have a lower risk of mortality.    Report Status 11/02/2021 FINAL  Final   Organism ID, Bacteria KLEBSIELLA PNEUMONIAE (A)  Final      Susceptibility   Klebsiella pneumoniae - MIC*    AMPICILLIN >=32 RESISTANT Resistant     CEFAZOLIN >=64 RESISTANT Resistant     CEFEPIME >=32 RESISTANT Resistant     CEFTRIAXONE >=64 RESISTANT Resistant     CIPROFLOXACIN >=4 RESISTANT Resistant     GENTAMICIN <=1 SENSITIVE Sensitive     IMIPENEM <=0.25 SENSITIVE Sensitive     NITROFURANTOIN 128 RESISTANT Resistant     TRIMETH/SULFA >=320 RESISTANT Resistant     AMPICILLIN/SULBACTAM >=32 RESISTANT Resistant     PIP/TAZO <=4 SENSITIVE Sensitive     * 20,000 COLONIES/mL KLEBSIELLA PNEUMONIAE    [name] presented with urinary retention, some dysuria, s/p TURP ~2 weeks  ago. Patient urinated multiple times in ED, provided some relief. UA with WBCs, RBCs, and protein. Given low colony count on urine culture, recommend no further treatment.   ED Provider: Milton Ferguson, MD   Francena Hanly, PharmD Pharmacy Resident  11/03/2021 9:28 AM

## 2021-11-13 ENCOUNTER — Other Ambulatory Visit (HOSPITAL_COMMUNITY): Payer: Self-pay

## 2021-11-24 ENCOUNTER — Emergency Department (HOSPITAL_COMMUNITY): Payer: Medicaid Other

## 2021-11-24 ENCOUNTER — Encounter (HOSPITAL_COMMUNITY): Payer: Self-pay | Admitting: Emergency Medicine

## 2021-11-24 ENCOUNTER — Other Ambulatory Visit: Payer: Self-pay

## 2021-11-24 ENCOUNTER — Emergency Department (HOSPITAL_COMMUNITY)
Admission: EM | Admit: 2021-11-24 | Discharge: 2021-11-24 | Discharge status: Against Medical Advice | Payer: Medicaid Other | Attending: Emergency Medicine | Admitting: Emergency Medicine

## 2021-11-24 DIAGNOSIS — R600 Localized edema: Secondary | ICD-10-CM | POA: Diagnosis not present

## 2021-11-24 DIAGNOSIS — R112 Nausea with vomiting, unspecified: Secondary | ICD-10-CM | POA: Insufficient documentation

## 2021-11-24 DIAGNOSIS — R0601 Orthopnea: Secondary | ICD-10-CM | POA: Insufficient documentation

## 2021-11-24 DIAGNOSIS — I509 Heart failure, unspecified: Secondary | ICD-10-CM | POA: Diagnosis not present

## 2021-11-24 DIAGNOSIS — Z5321 Procedure and treatment not carried out due to patient leaving prior to being seen by health care provider: Secondary | ICD-10-CM | POA: Diagnosis not present

## 2021-11-24 LAB — CBC
HCT: 29.4 % — ABNORMAL LOW (ref 39.0–52.0)
Hemoglobin: 9.4 g/dL — ABNORMAL LOW (ref 13.0–17.0)
MCH: 28.8 pg (ref 26.0–34.0)
MCHC: 32 g/dL (ref 30.0–36.0)
MCV: 90.2 fL (ref 80.0–100.0)
Platelets: 359 10*3/uL (ref 150–400)
RBC: 3.26 MIL/uL — ABNORMAL LOW (ref 4.22–5.81)
RDW: 15.3 % (ref 11.5–15.5)
WBC: 9.4 10*3/uL (ref 4.0–10.5)
nRBC: 0 % (ref 0.0–0.2)

## 2021-11-24 LAB — BASIC METABOLIC PANEL
Anion gap: 7 (ref 5–15)
BUN: 38 mg/dL — ABNORMAL HIGH (ref 8–23)
CO2: 20 mmol/L — ABNORMAL LOW (ref 22–32)
Calcium: 8.4 mg/dL — ABNORMAL LOW (ref 8.9–10.3)
Chloride: 113 mmol/L — ABNORMAL HIGH (ref 98–111)
Creatinine, Ser: 3.78 mg/dL — ABNORMAL HIGH (ref 0.61–1.24)
GFR, Estimated: 17 mL/min — ABNORMAL LOW (ref >60)
Glucose, Bld: 116 mg/dL — ABNORMAL HIGH (ref 70–99)
Potassium: 3.2 mmol/L — ABNORMAL LOW (ref 3.5–5.1)
Sodium: 140 mmol/L (ref 135–145)

## 2021-11-24 LAB — BRAIN NATRIURETIC PEPTIDE: B Natriuretic Peptide: 1341.9 pg/mL — ABNORMAL HIGH (ref 0.0–100.0)

## 2021-11-24 NOTE — ED Provider Triage Note (Signed)
Emergency Medicine Provider Triage Evaluation Note  Ryan Jenkins , a 61 y.o. male  was evaluated in triage.  Pt complains of orthopnea, lower extremity edema.  Patient reports he has CHF.  Patient reports that for the last 2 days he has had increased swelling to his lower extremities, shortness of breath, orthopnea.  The patient also complains of 1 episode of nausea and vomiting today.  Patient reports he is compliant on Lasix, takes 40 mg in the morning and 40 mg at night.  Patient denies missing any doses.  Patient denies any chest pain, diarrhea, fevers.  Review of Systems  Positive:  Negative:   Physical Exam  BP (!) 176/88 (BP Location: Left Arm)   Pulse 100   Temp 98.4 F (36.9 C) (Oral)   Resp 16   SpO2 100%  Gen:   Awake, no distress   Resp:  Normal effort  MSK:   Moves extremities without difficulty  Other:  1+ nonpitting bilateral lower extremity edema, lungs clear  Medical Decision Making  Medically screening exam initiated at 4:56 PM.  Appropriate orders placed.  JIMIE KUWAHARA was informed that the remainder of the evaluation will be completed by another provider, this initial triage assessment does not replace that evaluation, and the importance of remaining in the ED until their evaluation is complete.     Azucena Cecil, PA-C 11/24/21 1657

## 2021-11-24 NOTE — ED Triage Notes (Signed)
Patient reports a CHF flare-up. He has lower leg swelling and reports chest tightness and productive cough w. Phlegm.

## 2021-11-25 ENCOUNTER — Encounter (HOSPITAL_COMMUNITY): Payer: Self-pay

## 2021-11-25 ENCOUNTER — Emergency Department (HOSPITAL_COMMUNITY): Payer: Medicaid Other

## 2021-11-25 ENCOUNTER — Other Ambulatory Visit: Payer: Self-pay

## 2021-11-25 ENCOUNTER — Inpatient Hospital Stay (HOSPITAL_COMMUNITY)
Admission: EM | Admit: 2021-11-25 | Discharge: 2021-11-29 | DRG: 291 | Disposition: A | Discharge status: Home Health | Payer: Medicaid Other | Attending: Internal Medicine | Admitting: Internal Medicine

## 2021-11-25 DIAGNOSIS — J9601 Acute respiratory failure with hypoxia: Secondary | ICD-10-CM | POA: Diagnosis present

## 2021-11-25 DIAGNOSIS — E1140 Type 2 diabetes mellitus with diabetic neuropathy, unspecified: Secondary | ICD-10-CM | POA: Diagnosis present

## 2021-11-25 DIAGNOSIS — N401 Enlarged prostate with lower urinary tract symptoms: Secondary | ICD-10-CM | POA: Diagnosis not present

## 2021-11-25 DIAGNOSIS — E119 Type 2 diabetes mellitus without complications: Secondary | ICD-10-CM | POA: Diagnosis present

## 2021-11-25 DIAGNOSIS — E11319 Type 2 diabetes mellitus with unspecified diabetic retinopathy without macular edema: Secondary | ICD-10-CM | POA: Diagnosis present

## 2021-11-25 DIAGNOSIS — R2689 Other abnormalities of gait and mobility: Secondary | ICD-10-CM | POA: Diagnosis present

## 2021-11-25 DIAGNOSIS — N400 Enlarged prostate without lower urinary tract symptoms: Secondary | ICD-10-CM | POA: Diagnosis present

## 2021-11-25 DIAGNOSIS — I5033 Acute on chronic diastolic (congestive) heart failure: Secondary | ICD-10-CM | POA: Diagnosis not present

## 2021-11-25 DIAGNOSIS — N179 Acute kidney failure, unspecified: Secondary | ICD-10-CM | POA: Diagnosis not present

## 2021-11-25 DIAGNOSIS — I509 Heart failure, unspecified: Secondary | ICD-10-CM

## 2021-11-25 DIAGNOSIS — K3184 Gastroparesis: Secondary | ICD-10-CM | POA: Diagnosis present

## 2021-11-25 DIAGNOSIS — N138 Other obstructive and reflux uropathy: Secondary | ICD-10-CM | POA: Diagnosis present

## 2021-11-25 DIAGNOSIS — E1142 Type 2 diabetes mellitus with diabetic polyneuropathy: Secondary | ICD-10-CM | POA: Diagnosis present

## 2021-11-25 DIAGNOSIS — E1143 Type 2 diabetes mellitus with diabetic autonomic (poly)neuropathy: Secondary | ICD-10-CM | POA: Diagnosis present

## 2021-11-25 DIAGNOSIS — D638 Anemia in other chronic diseases classified elsewhere: Secondary | ICD-10-CM

## 2021-11-25 DIAGNOSIS — E876 Hypokalemia: Secondary | ICD-10-CM | POA: Diagnosis present

## 2021-11-25 DIAGNOSIS — I2583 Coronary atherosclerosis due to lipid rich plaque: Secondary | ICD-10-CM

## 2021-11-25 DIAGNOSIS — Z91040 Latex allergy status: Secondary | ICD-10-CM

## 2021-11-25 DIAGNOSIS — E1122 Type 2 diabetes mellitus with diabetic chronic kidney disease: Secondary | ICD-10-CM | POA: Diagnosis present

## 2021-11-25 DIAGNOSIS — E785 Hyperlipidemia, unspecified: Secondary | ICD-10-CM | POA: Diagnosis present

## 2021-11-25 DIAGNOSIS — Z7902 Long term (current) use of antithrombotics/antiplatelets: Secondary | ICD-10-CM

## 2021-11-25 DIAGNOSIS — N184 Chronic kidney disease, stage 4 (severe): Secondary | ICD-10-CM | POA: Diagnosis not present

## 2021-11-25 DIAGNOSIS — D631 Anemia in chronic kidney disease: Secondary | ICD-10-CM | POA: Diagnosis present

## 2021-11-25 DIAGNOSIS — Z79899 Other long term (current) drug therapy: Secondary | ICD-10-CM

## 2021-11-25 DIAGNOSIS — R9431 Abnormal electrocardiogram [ECG] [EKG]: Secondary | ICD-10-CM

## 2021-11-25 DIAGNOSIS — K219 Gastro-esophageal reflux disease without esophagitis: Secondary | ICD-10-CM | POA: Diagnosis present

## 2021-11-25 DIAGNOSIS — N3090 Cystitis, unspecified without hematuria: Secondary | ICD-10-CM | POA: Diagnosis present

## 2021-11-25 DIAGNOSIS — M62838 Other muscle spasm: Secondary | ICD-10-CM | POA: Diagnosis present

## 2021-11-25 DIAGNOSIS — R338 Other retention of urine: Secondary | ICD-10-CM | POA: Diagnosis present

## 2021-11-25 DIAGNOSIS — Z888 Allergy status to other drugs, medicaments and biological substances status: Secondary | ICD-10-CM

## 2021-11-25 DIAGNOSIS — R3914 Feeling of incomplete bladder emptying: Secondary | ICD-10-CM

## 2021-11-25 DIAGNOSIS — F32A Depression, unspecified: Secondary | ICD-10-CM | POA: Diagnosis present

## 2021-11-25 DIAGNOSIS — N189 Chronic kidney disease, unspecified: Secondary | ICD-10-CM | POA: Diagnosis present

## 2021-11-25 DIAGNOSIS — I2510 Atherosclerotic heart disease of native coronary artery without angina pectoris: Secondary | ICD-10-CM | POA: Diagnosis present

## 2021-11-25 DIAGNOSIS — Z8249 Family history of ischemic heart disease and other diseases of the circulatory system: Secondary | ICD-10-CM

## 2021-11-25 DIAGNOSIS — Z794 Long term (current) use of insulin: Secondary | ICD-10-CM

## 2021-11-25 DIAGNOSIS — I10 Essential (primary) hypertension: Secondary | ICD-10-CM | POA: Diagnosis present

## 2021-11-25 DIAGNOSIS — I2489 Other forms of acute ischemic heart disease: Secondary | ICD-10-CM | POA: Diagnosis present

## 2021-11-25 DIAGNOSIS — I130 Hypertensive heart and chronic kidney disease with heart failure and stage 1 through stage 4 chronic kidney disease, or unspecified chronic kidney disease: Principal | ICD-10-CM | POA: Diagnosis present

## 2021-11-25 LAB — CBC WITH DIFFERENTIAL/PLATELET
Abs Immature Granulocytes: 0.02 10*3/uL (ref 0.00–0.07)
Basophils Absolute: 0.1 10*3/uL (ref 0.0–0.1)
Basophils Relative: 1 %
Eosinophils Absolute: 0.2 10*3/uL (ref 0.0–0.5)
Eosinophils Relative: 4 %
HCT: 27 % — ABNORMAL LOW (ref 39.0–52.0)
Hemoglobin: 8.7 g/dL — ABNORMAL LOW (ref 13.0–17.0)
Immature Granulocytes: 0 %
Lymphocytes Relative: 19 %
Lymphs Abs: 1.2 10*3/uL (ref 0.7–4.0)
MCH: 29.5 pg (ref 26.0–34.0)
MCHC: 32.2 g/dL (ref 30.0–36.0)
MCV: 91.5 fL (ref 80.0–100.0)
Monocytes Absolute: 0.4 10*3/uL (ref 0.1–1.0)
Monocytes Relative: 7 %
Neutro Abs: 4.4 10*3/uL (ref 1.7–7.7)
Neutrophils Relative %: 69 %
Platelets: 310 10*3/uL (ref 150–400)
RBC: 2.95 MIL/uL — ABNORMAL LOW (ref 4.22–5.81)
RDW: 15.5 % (ref 11.5–15.5)
WBC: 6.4 10*3/uL (ref 4.0–10.5)
nRBC: 0 % (ref 0.0–0.2)

## 2021-11-25 LAB — TROPONIN I (HIGH SENSITIVITY)
Troponin I (High Sensitivity): 132 ng/L (ref <18)
Troponin I (High Sensitivity): 141 ng/L (ref <18)
Troponin I (High Sensitivity): 132 ng/L (ref <18)

## 2021-11-25 LAB — BASIC METABOLIC PANEL
Anion gap: 11 (ref 5–15)
BUN: 35 mg/dL — ABNORMAL HIGH (ref 8–23)
CO2: 19 mmol/L — ABNORMAL LOW (ref 22–32)
Calcium: 8.2 mg/dL — ABNORMAL LOW (ref 8.9–10.3)
Chloride: 110 mmol/L (ref 98–111)
Creatinine, Ser: 3.79 mg/dL — ABNORMAL HIGH (ref 0.61–1.24)
GFR, Estimated: 17 mL/min — ABNORMAL LOW (ref >60)
Glucose, Bld: 155 mg/dL — ABNORMAL HIGH (ref 70–99)
Potassium: 3.1 mmol/L — ABNORMAL LOW (ref 3.5–5.1)
Sodium: 140 mmol/L (ref 135–145)

## 2021-11-25 LAB — MAGNESIUM: Magnesium: 1.7 mg/dL (ref 1.7–2.4)

## 2021-11-25 LAB — GLUCOSE, CAPILLARY: Glucose-Capillary: 140 mg/dL — ABNORMAL HIGH (ref 70–99)

## 2021-11-25 LAB — BRAIN NATRIURETIC PEPTIDE: B Natriuretic Peptide: 1426.9 pg/mL — ABNORMAL HIGH (ref 0.0–100.0)

## 2021-11-25 MED ORDER — ATORVASTATIN CALCIUM 40 MG PO TABS
40.0000 mg | ORAL_TABLET | Freq: Every day | ORAL | Status: DC
  Administered 2021-11-26 – 2021-11-29 (×4): 40 mg via ORAL
  Filled 2021-11-25 (×4): qty 1

## 2021-11-25 MED ORDER — CLOPIDOGREL BISULFATE 75 MG PO TABS
75.0000 mg | ORAL_TABLET | Freq: Every day | ORAL | Status: DC
  Administered 2021-11-26 – 2021-11-29 (×4): 75 mg via ORAL
  Filled 2021-11-25 (×4): qty 1

## 2021-11-25 MED ORDER — ENOXAPARIN SODIUM 30 MG/0.3ML IJ SOSY
30.0000 mg | PREFILLED_SYRINGE | INTRAMUSCULAR | Status: DC
  Administered 2021-11-25 – 2021-11-28 (×4): 30 mg via SUBCUTANEOUS
  Filled 2021-11-25 (×4): qty 0.3

## 2021-11-25 MED ORDER — SODIUM CHLORIDE 0.9% FLUSH
3.0000 mL | Freq: Two times a day (BID) | INTRAVENOUS | Status: DC
  Administered 2021-11-25 – 2021-11-29 (×8): 3 mL via INTRAVENOUS

## 2021-11-25 MED ORDER — FAMOTIDINE 20 MG PO TABS
40.0000 mg | ORAL_TABLET | Freq: Every day | ORAL | Status: DC
  Administered 2021-11-26: 40 mg via ORAL
  Filled 2021-11-25: qty 2

## 2021-11-25 MED ORDER — ACETAMINOPHEN 325 MG PO TABS
650.0000 mg | ORAL_TABLET | Freq: Four times a day (QID) | ORAL | Status: DC | PRN
  Administered 2021-11-26: 650 mg via ORAL
  Filled 2021-11-25 (×2): qty 2

## 2021-11-25 MED ORDER — ACETAMINOPHEN 500 MG PO TABS
1000.0000 mg | ORAL_TABLET | Freq: Once | ORAL | Status: DC
  Filled 2021-11-25: qty 2

## 2021-11-25 MED ORDER — NORTRIPTYLINE HCL 25 MG PO CAPS
25.0000 mg | ORAL_CAPSULE | Freq: Every day | ORAL | Status: DC
  Administered 2021-11-25 – 2021-11-28 (×4): 25 mg via ORAL
  Filled 2021-11-25 (×5): qty 1

## 2021-11-25 MED ORDER — FUROSEMIDE 10 MG/ML IJ SOLN
40.0000 mg | Freq: Once | INTRAMUSCULAR | Status: AC
  Administered 2021-11-25: 40 mg via INTRAVENOUS
  Filled 2021-11-25: qty 4

## 2021-11-25 MED ORDER — ESCITALOPRAM OXALATE 10 MG PO TABS
20.0000 mg | ORAL_TABLET | Freq: Every day | ORAL | Status: DC
  Administered 2021-11-26 – 2021-11-29 (×4): 20 mg via ORAL
  Filled 2021-11-25 (×4): qty 2

## 2021-11-25 MED ORDER — AMLODIPINE BESYLATE 10 MG PO TABS
10.0000 mg | ORAL_TABLET | Freq: Every day | ORAL | Status: DC
  Administered 2021-11-26 – 2021-11-29 (×4): 10 mg via ORAL
  Filled 2021-11-25 (×4): qty 1

## 2021-11-25 MED ORDER — FUROSEMIDE 10 MG/ML IJ SOLN
80.0000 mg | Freq: Two times a day (BID) | INTRAMUSCULAR | Status: DC
  Administered 2021-11-26 – 2021-11-29 (×7): 80 mg via INTRAVENOUS
  Filled 2021-11-25 (×7): qty 8

## 2021-11-25 MED ORDER — NITROGLYCERIN IN D5W 200-5 MCG/ML-% IV SOLN: 0.0000 ug/min | INTRAVENOUS | Status: DC

## 2021-11-25 MED ORDER — METOCLOPRAMIDE HCL 10 MG PO TABS: 5.0000 mg | ORAL_TABLET | Freq: Three times a day (TID) | ORAL | Status: DC | PRN

## 2021-11-25 MED ORDER — INSULIN GLARGINE-YFGN 100 UNIT/ML ~~LOC~~ SOLN
5.0000 [IU] | Freq: Every day | SUBCUTANEOUS | Status: DC
  Administered 2021-11-25 – 2021-11-28 (×4): 5 [IU] via SUBCUTANEOUS
  Filled 2021-11-25 (×5): qty 0.05

## 2021-11-25 MED ORDER — ACETAMINOPHEN 650 MG RE SUPP: 650.0000 mg | Freq: Four times a day (QID) | RECTAL | Status: DC | PRN

## 2021-11-25 MED ORDER — HYDRALAZINE HCL 50 MG PO TABS
100.0000 mg | ORAL_TABLET | Freq: Three times a day (TID) | ORAL | Status: DC
  Administered 2021-11-25 – 2021-11-29 (×12): 100 mg via ORAL
  Filled 2021-11-25 (×14): qty 2

## 2021-11-25 MED ORDER — PANTOPRAZOLE SODIUM 40 MG PO TBEC
40.0000 mg | DELAYED_RELEASE_TABLET | Freq: Every day | ORAL | Status: DC
  Administered 2021-11-26 – 2021-11-29 (×4): 40 mg via ORAL
  Filled 2021-11-25 (×4): qty 1

## 2021-11-25 MED ORDER — METOPROLOL SUCCINATE ER 50 MG PO TB24
50.0000 mg | ORAL_TABLET | Freq: Every day | ORAL | Status: DC
  Administered 2021-11-26 – 2021-11-29 (×4): 50 mg via ORAL
  Filled 2021-11-25 (×4): qty 1

## 2021-11-25 MED ORDER — POTASSIUM CHLORIDE CRYS ER 20 MEQ PO TBCR
20.0000 meq | EXTENDED_RELEASE_TABLET | Freq: Once | ORAL | Status: AC
  Administered 2021-11-25: 20 meq via ORAL
  Filled 2021-11-25: qty 1

## 2021-11-25 MED ORDER — FINASTERIDE 5 MG PO TABS
5.0000 mg | ORAL_TABLET | Freq: Every day | ORAL | Status: DC
  Administered 2021-11-26 – 2021-11-29 (×4): 5 mg via ORAL
  Filled 2021-11-25 (×4): qty 1

## 2021-11-25 MED ORDER — INSULIN ASPART 100 UNIT/ML IJ SOLN
0.0000 [IU] | Freq: Three times a day (TID) | INTRAMUSCULAR | Status: DC
  Administered 2021-11-26 – 2021-11-27 (×2): 2 [IU] via SUBCUTANEOUS
  Administered 2021-11-28 – 2021-11-29 (×3): 1 [IU] via SUBCUTANEOUS

## 2021-11-25 MED ORDER — SODIUM BICARBONATE 650 MG PO TABS
650.0000 mg | ORAL_TABLET | Freq: Two times a day (BID) | ORAL | Status: DC
  Administered 2021-11-26 – 2021-11-29 (×7): 650 mg via ORAL
  Filled 2021-11-25 (×7): qty 1

## 2021-11-25 MED ORDER — POTASSIUM CHLORIDE CRYS ER 20 MEQ PO TBCR
40.0000 meq | EXTENDED_RELEASE_TABLET | Freq: Once | ORAL | Status: AC
  Administered 2021-11-25: 40 meq via ORAL
  Filled 2021-11-25: qty 2

## 2021-11-25 MED ORDER — POLYETHYLENE GLYCOL 3350 17 G PO PACK: 17.0000 g | PACK | Freq: Every day | ORAL | Status: DC | PRN

## 2021-11-25 MED ORDER — TAMSULOSIN HCL 0.4 MG PO CAPS
0.4000 mg | ORAL_CAPSULE | Freq: Every day | ORAL | Status: DC
  Administered 2021-11-26 – 2021-11-29 (×4): 0.4 mg via ORAL
  Filled 2021-11-25 (×4): qty 1

## 2021-11-25 NOTE — ED Provider Notes (Signed)
[facility] EMERGENCY DEPARTMENT Provider Note   CSN: [id number] Arrival date & time: 11/25/21  1206     History {Add pertinent medical, surgical, social history, OB history to HPI:1} Chief Complaint  Patient presents with  . Shortness of Breath    ROSALIE Jenkins is a 61 y.o. male Who presents emergency department with shortness of breath. He has a past medical history of CAD, CHF, stage IV kidney disease. Patient reports 4 days of worsening shortness of breath.  He normally weighs about 206 pounds and is currently weighing about 220 pounds.  He takes 40 mg of Lasix twice daily and states that he has not had very much urinary output like he normally would.  He has increasing exertional dyspnea and orthopnea and feels like this is consistent with history of CHF exacerbation.  Patient denies any chest pain or tightness. Patient also has a moderate headache and is asking for Tylenol.  He denies fevers, chills, neck stiffness, photophobia.   Shortness of Breath      Home Medications Prior to Admission medications   Medication Sig Start Date End Date Taking? Authorizing Provider  acetaminophen (TYLENOL) 500 MG tablet Take 1,000 mg by mouth every 6 (six) hours as needed for mild pain or headache.    [provider]  amLODipine (NORVASC) 10 MG tablet Take 1 tablet (10 mg total) by mouth daily. 02/08/21   Danford, Suann Larry, MD  atorvastatin (LIPITOR) 40 MG tablet Take 1 tablet (40 mg total) by mouth daily. 02/08/21   Danford, Suann Larry, MD  Blood Glucose Monitoring Suppl (TRUE METRIX METER) w/Device KIT Use as directed 07/20/20   Ladell Pier, MD  Blood Pressure Monitor DEVI Use as directed to check home blood pressure 2-3 times a week 07/13/20   Camillia Herter, NP  cephALEXin (KEFLEX) 500 MG capsule Take 1 capsule (500 mg total) by mouth 3 (three) times daily. 10/31/21   Drenda Freeze, MD  Cholecalciferol (VITAMIN D3) 1.25 MG (50000 UT) CAPS  Take 50,000 Units by mouth once a week. 09/12/21   [provider]  clopidogrel (PLAVIX) 75 MG tablet Take 1 tablet (75 mg total) by mouth daily. 07/05/21   de Yolanda Manges, Cortney E, NP  clotrimazole (LOTRIMIN) 1 % cream Apply 1 Application topically 2 (two) times daily as needed (rash). 08/01/21   [provider]  D-Mannose 350 MG CAPS Take 1,050 capsules by mouth in the morning and at bedtime.    [provider]  docusate sodium (COLACE) 100 MG capsule Take 1 capsule (100 mg total) by mouth daily as needed for up to 30 doses. 10/16/21   Janith Lima, MD  escitalopram (LEXAPRO) 20 MG tablet Take 1 tablet (20 mg total) by mouth daily. 07/20/20   Charlott Rakes, MD  famotidine (PEPCID) 40 MG tablet Take 40 mg by mouth daily. 08/08/21   [provider]  finasteride (PROSCAR) 5 MG tablet Take 1 tablet (5 mg total) by mouth daily. 07/20/20   Charlott Rakes, MD  furosemide (LASIX) 40 MG tablet Take 1 tablet (40 mg total) by mouth 2 (two) times daily. 02/08/21   Danford, Suann Larry, MD  glucose blood (TRUE METRIX BLOOD GLUCOSE TEST) test strip Use as instructed 07/20/20   Ladell Pier, MD  hydrALAZINE (APRESOLINE) 100 MG tablet Take 1 tablet (100 mg total) by mouth 3 (three) times daily. 02/08/21   Danford, Suann Larry, MD  Insulin Syringe-Needle U-100 (TRUEPLUS INSULIN SYRINGE) 31G X  5/16" 0.3 ML MISC Use to inject Levemir at bedtime. 07/20/20   Charlott Rakes, MD  LANTUS SOLOSTAR 100 UNIT/ML Solostar Pen Inject 10 Units into the skin at bedtime. 10/22/21   Donne Hazel, MD  meclizine (ANTIVERT) 25 MG tablet Take 25 mg by mouth 3 (three) times daily as needed for dizziness. 08/08/21   [provider]  metoCLOPramide (REGLAN) 10 MG tablet Take 0.5 tablets (5 mg total) by mouth every 8 (eight) hours as needed for nausea or vomiting (Or headache). 07/09/21   Godfrey Pick, MD  metoprolol succinate (TOPROL-XL) 50 MG 24 hr tablet Take 1 tablet (50 mg total) by mouth at  bedtime. Take with or immediately following a meal. 02/08/21   Danford, Suann Larry, MD  nortriptyline (PAMELOR) 25 MG capsule Take 1 capsule (25 mg total) by mouth at bedtime. 07/20/20   Charlott Rakes, MD  oxyCODONE-acetaminophen (PERCOCET) 5-325 MG tablet Take 1 tablet by mouth every 4 (four) hours as needed for up to 18 doses for severe pain. 10/16/21   Janith Lima, MD  pantoprazole (PROTONIX) 40 MG tablet Take 1 tablet (40 mg total) by mouth daily. 02/08/21   Danford, Suann Larry, MD  potassium chloride (KLOR-CON M) 10 MEQ tablet Take 1 tablet (10 mEq total) by mouth daily. 02/08/21   Danford, Suann Larry, MD  sodium bicarbonate 650 MG tablet Take 650 mg by mouth 2 (two) times daily. 09/20/21   [provider]  tamsulosin (FLOMAX) 0.4 MG CAPS capsule Take 0.4 mg by mouth at bedtime. 05/25/21   [provider]  traZODone (DESYREL) 50 MG tablet Take 3 tablets (150 mg total) by mouth at bedtime. Patient taking differently: Take 50 mg by mouth at bedtime as needed for sleep. 07/20/20   Charlott Rakes, MD  triamcinolone ointment (KENALOG) 0.5 % Apply 1 Application topically 2 (two) times daily as needed (rash). 08/31/21   [provider]  TRUEplus Lancets 28G MISC Use as directed 07/20/20   Ladell Pier, MD      Allergies    Claritin [loratadine], Hydrochlorothiazide, Latex, Lyrica [pregabalin], and Metformin and related    Review of Systems   Review of Systems  Respiratory:  Positive for shortness of breath.     Physical Exam Updated Vital Signs BP (!) 146/84   Pulse 80   Temp 97.7 F (36.5 C)   Resp 10   Ht _0  (1.88 m)   Wt 93.4 kg   SpO2 100%   BMI 26.45 kg/m  Physical Exam Vitals and nursing note reviewed.  Constitutional:      General: He is not in acute distress.    Appearance: He is well-developed. He is not diaphoretic.  HENT:     Head: Normocephalic and atraumatic.  Eyes:     General: No scleral icterus.    Conjunctiva/sclera:  Conjunctivae normal.  Cardiovascular:     Rate and Rhythm: Normal rate and regular rhythm.     Heart sounds: Normal heart sounds.  Pulmonary:     Effort: Pulmonary effort is normal. No respiratory distress.     Breath sounds: Examination of the right-lower field reveals rales. Examination of the left-lower field reveals rales. Rales present.  Abdominal:     Palpations: Abdomen is soft.     Tenderness: There is no abdominal tenderness.  Musculoskeletal:     Cervical back: Normal range of motion and neck supple.     Right lower leg: Edema present.     Left lower leg:  Edema present.  Skin:    General: Skin is warm and dry.  Neurological:     Mental Status: He is alert.  Psychiatric:        Behavior: Behavior normal.    ED Results / Procedures / Treatments   Labs (all labs ordered are listed, but only abnormal results are displayed) Labs Reviewed  CBC WITH DIFFERENTIAL/PLATELET - Abnormal; Notable for the following components:      Result Value   RBC 2.95 (*)    Hemoglobin 8.7 (*)    HCT 27.0 (*)    All other components within normal limits  BASIC METABOLIC PANEL - Abnormal; Notable for the following components:   Potassium 3.1 (*)    CO2 19 (*)    Glucose, Bld 155 (*)    BUN 35 (*)    Creatinine, Ser 3.79 (*)    Calcium 8.2 (*)    GFR, Estimated 17 (*)    All other components within normal limits  BRAIN NATRIURETIC PEPTIDE - Abnormal; Notable for the following components:   B Natriuretic Peptide 1,426.9 (*)    All other components within normal limits  TROPONIN I (HIGH SENSITIVITY) - Abnormal; Notable for the following components:   Troponin I (High Sensitivity) 132 (*)    All other components within normal limits  TROPONIN I (HIGH SENSITIVITY)    EKG None  Radiology DG Chest 2 View  Result Date: 11/25/2021 CLINICAL DATA:  Short of breath, cough and congestion with mild chest pain for 4 days. EXAM: CHEST - 2 VIEW COMPARISON:  11/24/2021 and older exams. FINDINGS:  Normal heart, mediastinum and hila. Clear lungs.  No pleural effusion or pneumothorax. Skeletal structures are unremarkable. IMPRESSION: No active cardiopulmonary disease. Electronically Signed   By: Lajean Manes M.D.   On: 11/25/2021 13:24   DG Chest 2 View  Result Date: 11/24/2021 CLINICAL DATA:  Shortness of breath. Chest tightness. Productive cough. EXAM: CHEST - 2 VIEW COMPARISON:  10/20/2021 FINDINGS: Stable enlarged cardiac silhouette. Decreased prominence of the interstitial markings and pulmonary vasculature. No airspace consolidation or pleural fluid. Unremarkable bones. IMPRESSION: No acute abnormality. Stable cardiomegaly. Electronically Signed   By: Claudie Revering M.D.   On: 11/24/2021 17:29    Procedures Procedures  {Document cardiac monitor, telemetry assessment procedure when appropriate:1}  Medications Ordered in ED Medications  nitroGLYCERIN 50 mg in dextrose 5 % 250 mL (0.2 mg/mL) infusion (has no administration in time range)    ED Course/ Medical Decision Making/ A&P                           Medical Decision Making Risk OTC drugs. Prescription drug management.   ***  {Document critical care time when appropriate:1} {Document review of labs and clinical decision tools ie heart score, Chads2Vasc2 etc:1}  {Document your independent review of radiology images, and any outside records:1} {Document your discussion with family members, caretakers, and with consultants:1} {Document social determinants of health affecting pt's care:1} {Document your decision making why or why not admission, treatments were needed:1} Final Clinical Impression(s) / ED Diagnoses Final diagnoses:  None    Rx / DC Orders ED Discharge Orders     None

## 2021-11-25 NOTE — ED Notes (Signed)
Pt presents with c/o fluid retention and increased shortness of breath increasing over 3 days. Hx of CHF. Reports gaining 15lbs in the last 3 days.

## 2021-11-25 NOTE — Plan of Care (Signed)
  Problem: Education: Goal: Knowledge of General Education information will improve Description: Including pain rating scale, medication(s)/side effects and non-pharmacologic comfort measures Outcome: Progressing   Problem: Health Behavior/Discharge Planning: Goal: Ability to manage health-related needs will improve Outcome: Progressing   Problem: Clinical Measurements: Goal: Respiratory complications will improve Outcome: Progressing   Problem: Clinical Measurements: Goal: Cardiovascular complication will be avoided Outcome: Progressing   Problem: Safety: Goal: Ability to remain free from injury will improve Outcome: Progressing

## 2021-11-25 NOTE — ED Provider Triage Note (Signed)
Emergency Medicine Provider Triage Evaluation Note  Ryan Jenkins , a 61 y.o. male  was evaluated in triage.  Pt complains of shortness of breath.  Worse the last few days.  Has orthopnea, lower extremity swelling.  Has history of CHF, CKD.  Compliant with meds at home.  Review of Systems  Positive: SOB, le swelling Negative: Fever, cp  Physical Exam  There were no vitals taken for this visit. Gen:   Awake, no distress   Resp:  Normal effort  MSK:   Moves extremities without difficulty, pitting edema BIL Other:    Medical Decision Making  Medically screening exam initiated at 12:07 PM.  Appropriate orders placed.  Ryan Jenkins was informed that the remainder of the evaluation will be completed by another provider, this initial triage assessment does not replace that evaluation, and the importance of remaining in the ED until their evaluation is complete.  SOB, LE edema   Monzerat Handler A, PA-C 11/25/21 1208

## 2021-11-25 NOTE — ED Provider Notes (Incomplete)
[name] Provider Note   CSN: [id number] Arrival date & time: 11/25/21  1206     History {Add pertinent medical, surgical, social history, OB history to HPI:1} Chief Complaint  Patient presents with  . Shortness of Breath    Ryan Jenkins is a 61 y.o. male Who presents emergency department with shortness of breath. He has a past medical history of CAD, CHF, stage IV kidney disease. Patient reports 4 days of worsening shortness of breath.  He normally weighs about 206 pounds and is currently weighing about 220 pounds.  He takes 40 mg of Lasix twice daily and states that he has not had very much urinary output like he normally would.  He has increasing exertional dyspnea and orthopnea and feels like this is consistent with history of CHF exacerbation.  Patient denies any chest pain or tightness. Patient also has a moderate headache and is asking for Tylenol.  He denies fevers, chills, neck stiffness, photophobia.   Shortness of Breath      Home Medications Prior to Admission medications   Medication Sig Start Date End Date Taking? Authorizing Provider  acetaminophen (TYLENOL) 500 MG tablet Take 1,000 mg by mouth every 6 (six) hours as needed for mild pain or headache.    [provider]  amLODipine (NORVASC) 10 MG tablet Take 1 tablet (10 mg total) by mouth daily. 02/08/21   Danford, Suann Larry, MD  atorvastatin (LIPITOR) 40 MG tablet Take 1 tablet (40 mg total) by mouth daily. 02/08/21   Danford, Suann Larry, MD  Blood Glucose Monitoring Suppl (TRUE METRIX METER) w/Device KIT Use as directed 07/20/20   Ladell Pier, MD  Blood Pressure Monitor DEVI Use as directed to check home blood pressure 2-3 times a week 07/13/20   Camillia Herter, NP  cephALEXin (KEFLEX) 500 MG capsule Take 1 capsule (500 mg total) by mouth 3 (three) times daily. 10/31/21   Drenda Freeze, MD  Cholecalciferol (VITAMIN D3) 1.25 MG (50000 UT) CAPS  Take 50,000 Units by mouth once a week. 09/12/21   [provider]  clopidogrel (PLAVIX) 75 MG tablet Take 1 tablet (75 mg total) by mouth daily. 07/05/21   de Yolanda Manges, Cortney E, NP  clotrimazole (LOTRIMIN) 1 % cream Apply 1 Application topically 2 (two) times daily as needed (rash). 08/01/21   [provider]  D-Mannose 350 MG CAPS Take 1,050 capsules by mouth in the morning and at bedtime.    [provider]  docusate sodium (COLACE) 100 MG capsule Take 1 capsule (100 mg total) by mouth daily as needed for up to 30 doses. 10/16/21   Janith Lima, MD  escitalopram (LEXAPRO) 20 MG tablet Take 1 tablet (20 mg total) by mouth daily. 07/20/20   Charlott Rakes, MD  famotidine (PEPCID) 40 MG tablet Take 40 mg by mouth daily. 08/08/21   [provider]  finasteride (PROSCAR) 5 MG tablet Take 1 tablet (5 mg total) by mouth daily. 07/20/20   Charlott Rakes, MD  furosemide (LASIX) 40 MG tablet Take 1 tablet (40 mg total) by mouth 2 (two) times daily. 02/08/21   Danford, Suann Larry, MD  glucose blood (TRUE METRIX BLOOD GLUCOSE TEST) test strip Use as instructed 07/20/20   Ladell Pier, MD  hydrALAZINE (APRESOLINE) 100 MG tablet Take 1 tablet (100 mg total) by mouth 3 (three) times daily. 02/08/21   Danford, Suann Larry, MD  Insulin Syringe-Needle U-100 (TRUEPLUS INSULIN SYRINGE) 31G X  5/16" 0.3 ML MISC Use to inject Levemir at bedtime. 07/20/20   Charlott Rakes, MD  LANTUS SOLOSTAR 100 UNIT/ML Solostar Pen Inject 10 Units into the skin at bedtime. 10/22/21   Donne Hazel, MD  meclizine (ANTIVERT) 25 MG tablet Take 25 mg by mouth 3 (three) times daily as needed for dizziness. 08/08/21   [provider]  metoCLOPramide (REGLAN) 10 MG tablet Take 0.5 tablets (5 mg total) by mouth every 8 (eight) hours as needed for nausea or vomiting (Or headache). 07/09/21   Godfrey Pick, MD  metoprolol succinate (TOPROL-XL) 50 MG 24 hr tablet Take 1 tablet (50 mg total) by mouth at  bedtime. Take with or immediately following a meal. 02/08/21   Danford, Suann Larry, MD  nortriptyline (PAMELOR) 25 MG capsule Take 1 capsule (25 mg total) by mouth at bedtime. 07/20/20   Charlott Rakes, MD  oxyCODONE-acetaminophen (PERCOCET) 5-325 MG tablet Take 1 tablet by mouth every 4 (four) hours as needed for up to 18 doses for severe pain. 10/16/21   Janith Lima, MD  pantoprazole (PROTONIX) 40 MG tablet Take 1 tablet (40 mg total) by mouth daily. 02/08/21   Danford, Suann Larry, MD  potassium chloride (KLOR-CON M) 10 MEQ tablet Take 1 tablet (10 mEq total) by mouth daily. 02/08/21   Danford, Suann Larry, MD  sodium bicarbonate 650 MG tablet Take 650 mg by mouth 2 (two) times daily. 09/20/21   [provider]  tamsulosin (FLOMAX) 0.4 MG CAPS capsule Take 0.4 mg by mouth at bedtime. 05/25/21   [provider]  traZODone (DESYREL) 50 MG tablet Take 3 tablets (150 mg total) by mouth at bedtime. Patient taking differently: Take 50 mg by mouth at bedtime as needed for sleep. 07/20/20   Charlott Rakes, MD  triamcinolone ointment (KENALOG) 0.5 % Apply 1 Application topically 2 (two) times daily as needed (rash). 08/31/21   [provider]  TRUEplus Lancets 28G MISC Use as directed 07/20/20   Ladell Pier, MD      Allergies    Claritin [loratadine], Hydrochlorothiazide, Latex, Lyrica [pregabalin], and Metformin and related    Review of Systems   Review of Systems  Respiratory:  Positive for shortness of breath.     Physical Exam Updated Vital Signs BP (!) 146/84   Pulse 80   Temp 97.7 F (36.5 C)   Resp 10   Ht _0  (1.88 m)   Wt 93.4 kg   SpO2 100%   BMI 26.45 kg/m  Physical Exam Vitals and nursing note reviewed.  Constitutional:      General: He is not in acute distress.    Appearance: He is well-developed. He is not diaphoretic.  HENT:     Head: Normocephalic and atraumatic.  Eyes:     General: No scleral icterus.    Conjunctiva/sclera:  Conjunctivae normal.  Cardiovascular:     Rate and Rhythm: Normal rate and regular rhythm.     Heart sounds: Normal heart sounds.  Pulmonary:     Effort: Pulmonary effort is normal. No respiratory distress.     Breath sounds: Examination of the right-lower field reveals rales. Examination of the left-lower field reveals rales. Rales present.  Abdominal:     Palpations: Abdomen is soft.     Tenderness: There is no abdominal tenderness.  Musculoskeletal:     Cervical back: Normal range of motion and neck supple.     Right lower leg: Edema present.     Left lower leg:  Edema present.  Skin:    General: Skin is warm and dry.  Neurological:     Mental Status: He is alert.  Psychiatric:        Behavior: Behavior normal.     ED Results / Procedures / Treatments   Labs (all labs ordered are listed, but only abnormal results are displayed) Labs Reviewed  CBC WITH DIFFERENTIAL/PLATELET - Abnormal; Notable for the following components:      Result Value   RBC 2.95 (*)    Hemoglobin 8.7 (*)    HCT 27.0 (*)    All other components within normal limits  BASIC METABOLIC PANEL - Abnormal; Notable for the following components:   Potassium 3.1 (*)    CO2 19 (*)    Glucose, Bld 155 (*)    BUN 35 (*)    Creatinine, Ser 3.79 (*)    Calcium 8.2 (*)    GFR, Estimated 17 (*)    All other components within normal limits  BRAIN NATRIURETIC PEPTIDE - Abnormal; Notable for the following components:   B Natriuretic Peptide 1,426.9 (*)    All other components within normal limits  TROPONIN I (HIGH SENSITIVITY) - Abnormal; Notable for the following components:   Troponin I (High Sensitivity) 132 (*)    All other components within normal limits  TROPONIN I (HIGH SENSITIVITY)    EKG None  Radiology DG Chest 2 View  Result Date: 11/25/2021 CLINICAL DATA:  Short of breath, cough and congestion with mild chest pain for 4 days. EXAM: CHEST - 2 VIEW COMPARISON:  11/24/2021 and older exams.  FINDINGS: Normal heart, mediastinum and hila. Clear lungs.  No pleural effusion or pneumothorax. Skeletal structures are unremarkable. IMPRESSION: No active cardiopulmonary disease. Electronically Signed   By: Lajean Manes M.D.   On: 11/25/2021 13:24   DG Chest 2 View  Result Date: 11/24/2021 CLINICAL DATA:  Shortness of breath. Chest tightness. Productive cough. EXAM: CHEST - 2 VIEW COMPARISON:  10/20/2021 FINDINGS: Stable enlarged cardiac silhouette. Decreased prominence of the interstitial markings and pulmonary vasculature. No airspace consolidation or pleural fluid. Unremarkable bones. IMPRESSION: No acute abnormality. Stable cardiomegaly. Electronically Signed   By: Claudie Revering M.D.   On: 11/24/2021 17:29    Procedures Procedures  {Document cardiac monitor, telemetry assessment procedure when appropriate:1}  Medications Ordered in ED Medications  nitroGLYCERIN 50 mg in dextrose 5 % 250 mL (0.2 mg/mL) infusion (has no administration in time range)    ED Course/ Medical Decision Making/ A&P                           Medical Decision Making Risk OTC drugs. Prescription drug management. Decision regarding hospitalization.   ***  {Document critical care time when appropriate:1} {Document review of labs and clinical decision tools ie heart score, Chads2Vasc2 etc:1}  {Document your independent review of radiology images, and any outside records:1} {Document your discussion with family members, caretakers, and with consultants:1} {Document social determinants of health affecting pt's care:1} {Document your decision making why or why not admission, treatments were needed:1} Final Clinical Impression(s) / ED Diagnoses Final diagnoses:  None    Rx / DC Orders ED Discharge Orders     None

## 2021-11-25 NOTE — ED Triage Notes (Signed)
Pt BIB [facility] from home c/o [facility] and a CHF flare-up. Pt states he gets [facility] with exertion. Pt went to the hospital yesterday for same but did not stay. Pt is sating 100% on RA, able to talk in complete sentences.

## 2021-11-25 NOTE — H&P (Signed)
History and Physical   Ryan Jenkins QIW:[id number] DOB: August 27, 1960 DOA: 11/25/2021  PCP: [name], MD   Patient coming from: Home  Chief Complaint: Shortness of breath, edema, weight gain  HPI: Ryan Jenkins is a 61 y.o. male with medical history significant of diabetes, gastroparesis, neuropathy, hypertension, diastolic CHF, hyperlipidemia, CAD, depression, GERD, BPH, CKD 4, anemia presenting with worsening shortness of breath and weight gain.  Patient reports 3 days of worsening shortness of breath and a 15 pound weight gain from 205 to 220 pounds during that time.  Also reports decreased urine output despite taking all of his home Lasix doses and worsened edema.  Also reporting a mild headache.  He denies fevers, chills, chest pain, abdominal pain, constipation, diarrhea, nausea, vomiting.  ED Course: Vital signs in the ED significant for blood pressure in the 417E to 081K systolic.  Lab work-up included BMP with potassium 3.1, bicarb 19, BUN 35, creatinine elevated to 3.79 from baseline of 3-3.5, calcium 8.2.  CBC with hemoglobin stable at 8.7.  BNP elevated 1426.  Troponin elevated to 132 and then 141 on repeat.  Chest x-ray showed no acute Hilda Blades.  Patient received Lasix, Tylenol, and 20 mEq p.o. potassium in the ED.  Review of Systems: As per HPI otherwise all other systems reviewed and are negative.  Past Medical History:  Diagnosis Date   Anemia    Anginal pain ([place])    Anxiety    Arthritis    BPH with obstruction/lower urinary tract symptoms    CAD (coronary artery disease)    cardiologist--- dr Junius Roads badal;  11-06-2019 cardiac cath in [place] (result in care everywhere)  nonobstructive cad involing pRCA 60% (done in setting worseing CHF/ acute pulmonary edema requiring intubation/ AKI   Chronic combined systolic and diastolic CHF (congestive heart failure) ([place])    Chronic kidney disease, stage IV (severe) ([place])    nephrologist--- dr Carolin Sicks    Depression    Diabetic neuropathy ([place])    Dyspnea    Edema of both lower extremities    GERD (gastroesophageal reflux disease)    Heart murmur    History of community acquired pneumonia    admission 06-04-2021 in [facility]  w/ ARF hypoxia w/ severe sepsis   Hyperlipidemia    Hypertension    Insulin dependent type 2 diabetes mellitus ([place])    Pneumonia    Retinopathy due to secondary diabetes ([place])    Uses walker    Vitamin B12 deficiency    Vitreous hemorrhage [facility])     Past Surgical History:  Procedure Laterality Date   CARDIAC CATHETERIZATION  11/06/2019   [place] in [place];    nonobstructive CAD , pRCA 60% (result in care everywhere)   CATARACT EXTRACTION W/ INTRAOCULAR LENS IMPLANT Bilateral 2017   TRANSURETHRAL RESECTION OF PROSTATE N/A 10/16/2021   Procedure: TRANSURETHRAL RESECTION OF THE PROSTATE (TURP);  Surgeon: Janith Lima, MD;  Location: [facility];  Service: Urology;  Laterality: N/A;    Social History  reports that he has never smoked. He has never used smokeless tobacco. He reports that he does not drink alcohol and does not use drugs.  Allergies  Allergen Reactions   Claritin [Loratadine] Swelling    Joint swelling    Hydrochlorothiazide Other (See Comments)    Dizziness   Latex Hives   Lyrica [Pregabalin] Other (See Comments)    depression   Metformin And Related Other (See Comments)    GI    Family  History  Problem Relation Age of Onset   Renal cancer Mother    Hypertension Mother    Pancreatic cancer Mother    Hypertension Sister    Stroke Sister    Leukemia Maternal Uncle    Sickle cell trait Maternal Aunt    Colon cancer Neg Hx    Esophageal cancer Neg Hx    Rectal cancer Neg Hx    Stomach cancer Neg Hx   Reviewed on admission  Prior to Admission medications   Medication Sig Start Date End Date Taking? Authorizing Provider  amLODipine (NORVASC) 10 MG tablet Take 1 tablet (10 mg total) by mouth daily. 02/08/21  Yes Danford,  Suann Larry, MD  atorvastatin (LIPITOR) 40 MG tablet Take 1 tablet (40 mg total) by mouth daily. 02/08/21  Yes Danford, Suann Larry, MD  Cholecalciferol (VITAMIN D3) 1.25 MG (50000 UT) CAPS Take 50,000 Units by mouth once a week. 09/12/21  Yes [provider]  clopidogrel (PLAVIX) 75 MG tablet Take 1 tablet (75 mg total) by mouth daily. 07/05/21  Yes de Yolanda Manges, Cortney E, NP  escitalopram (LEXAPRO) 20 MG tablet Take 1 tablet (20 mg total) by mouth daily. 07/20/20  Yes Charlott Rakes, MD  famotidine (PEPCID) 40 MG tablet Take 40 mg by mouth daily. 08/08/21  Yes [provider]  finasteride (PROSCAR) 5 MG tablet Take 1 tablet (5 mg total) by mouth daily. 07/20/20  Yes Charlott Rakes, MD  furosemide (LASIX) 40 MG tablet Take 1 tablet (40 mg total) by mouth 2 (two) times daily. 02/08/21  Yes Danford, Suann Larry, MD  hydrALAZINE (APRESOLINE) 100 MG tablet Take 1 tablet (100 mg total) by mouth 3 (three) times daily. 02/08/21  Yes Danford, Suann Larry, MD  LANTUS SOLOSTAR 100 UNIT/ML Solostar Pen Inject 10 Units into the skin at bedtime. 10/22/21  Yes Donne Hazel, MD  meclizine (ANTIVERT) 25 MG tablet Take 25 mg by mouth 3 (three) times daily as needed for dizziness. 08/08/21  Yes [provider]  metoCLOPramide (REGLAN) 10 MG tablet Take 0.5 tablets (5 mg total) by mouth every 8 (eight) hours as needed for nausea or vomiting (Or headache). 07/09/21  Yes Godfrey Pick, MD  metoprolol succinate (TOPROL-XL) 50 MG 24 hr tablet Take 1 tablet (50 mg total) by mouth at bedtime. Take with or immediately following a meal. 02/08/21  Yes Danford, Suann Larry, MD  nortriptyline (PAMELOR) 25 MG capsule Take 1 capsule (25 mg total) by mouth at bedtime. 07/20/20  Yes Charlott Rakes, MD  pantoprazole (PROTONIX) 40 MG tablet Take 1 tablet (40 mg total) by mouth daily. 02/08/21  Yes Danford, Suann Larry, MD  potassium chloride (KLOR-CON M) 10 MEQ tablet Take 1 tablet (10 mEq total) by mouth  daily. 02/08/21  Yes Danford, Suann Larry, MD  sodium bicarbonate 650 MG tablet Take 650 mg by mouth 2 (two) times daily. 09/20/21  Yes [provider]  tamsulosin (FLOMAX) 0.4 MG CAPS capsule Take 0.4 mg by mouth at bedtime. 05/25/21  Yes [provider]  acetaminophen (TYLENOL) 500 MG tablet Take 1,000 mg by mouth every 6 (six) hours as needed for mild pain or headache.    [provider]  Blood Glucose Monitoring Suppl (TRUE METRIX METER) w/Device KIT Use as directed 07/20/20   Ladell Pier, MD  Blood Pressure Monitor DEVI Use as directed to check home blood pressure 2-3 times a week 07/13/20   Camillia Herter, NP  cephALEXin (KEFLEX) 500 MG capsule Take 1  capsule (500 mg total) by mouth 3 (three) times daily. 10/31/21   Drenda Freeze, MD  clotrimazole (LOTRIMIN) 1 % cream Apply 1 Application topically 2 (two) times daily as needed (rash). 08/01/21   [provider]  D-Mannose 350 MG CAPS Take 1,050 capsules by mouth in the morning and at bedtime.    [provider]  docusate sodium (COLACE) 100 MG capsule Take 1 capsule (100 mg total) by mouth daily as needed for up to 30 doses. 10/16/21   Janith Lima, MD  glucose blood (TRUE METRIX BLOOD GLUCOSE TEST) test strip Use as instructed 07/20/20   Ladell Pier, MD  Insulin Syringe-Needle U-100 (TRUEPLUS INSULIN SYRINGE) 31G X 5/16" 0.3 ML MISC Use to inject Levemir at bedtime. 07/20/20   Charlott Rakes, MD  oxyCODONE-acetaminophen (PERCOCET) 5-325 MG tablet Take 1 tablet by mouth every 4 (four) hours as needed for up to 18 doses for severe pain. 10/16/21   Janith Lima, MD  traZODone (DESYREL) 50 MG tablet Take 3 tablets (150 mg total) by mouth at bedtime. Patient taking differently: Take 50 mg by mouth at bedtime as needed for sleep. 07/20/20   Charlott Rakes, MD  triamcinolone ointment (KENALOG) 0.5 % Apply 1 Application topically 2 (two) times daily as needed (rash). 08/31/21   [provider]  TRUEplus Lancets 28G MISC Use as directed 07/20/20   Ladell Pier, MD    Physical Exam: Vitals:   11/25/21 1515 11/25/21 1530 11/25/21 1600 11/25/21 1630  BP: (!) 146/84 (!) 144/79 (!) 151/82 (!) 150/81  Pulse: 80 84 82 81  Resp: _0 Temp:      TempSrc:      SpO2: 100% 100% 98% 98%  Weight:      Height:        Physical Exam Constitutional:      General: He is not in acute distress.    Appearance: Normal appearance.  HENT:     Head: Normocephalic and atraumatic.     Mouth/Throat:     Mouth: Mucous membranes are moist.     Pharynx: Oropharynx is clear.  Eyes:     Extraocular Movements: Extraocular movements intact.     Pupils: Pupils are equal, round, and reactive to light.  Cardiovascular:     Rate and Rhythm: Normal rate and regular rhythm.     Pulses: Normal pulses.     Heart sounds: Normal heart sounds.  Pulmonary:     Effort: Pulmonary effort is normal. No respiratory distress.     Breath sounds: Rales present.  Abdominal:     General: Bowel sounds are normal. There is no distension.     Palpations: Abdomen is soft.     Tenderness: There is no abdominal tenderness.  Musculoskeletal:        General: No swelling or deformity.     Right lower leg: Edema present.     Left lower leg: Edema present.  Skin:    General: Skin is warm and dry.  Neurological:     General: No focal deficit present.     Mental Status: Mental status is at baseline.    Labs on Admission: I have personally reviewed following labs and imaging studies  CBC: Recent Labs  Lab 11/24/21 1656 11/25/21 1222  WBC 9.4 6.4  NEUTROABS  --  4.4  HGB 9.4* 8.7*  HCT 29.4* 27.0*  MCV 90.2 91.5  PLT 359 644    Basic Metabolic Panel: Recent  Labs  Lab 11/24/21 1656 11/25/21 1222  NA 140 140  K 3.2* 3.1*  CL 113* 110  CO2 20* 19*  GLUCOSE 116* 155*  BUN 38* 35*  CREATININE 3.78* 3.79*  CALCIUM 8.4* 8.2*    GFR: Estimated Creatinine Clearance: 23.8 mL/min  (A) (by C-G formula based on SCr of 3.79 mg/dL (H)).  Liver Function Tests: No results for input(s): "AST", "ALT", "ALKPHOS", "BILITOT", "PROT", "ALBUMIN" in the last 168 hours.  Urine analysis:    Component Value Date/Time   COLORURINE RED (A) 10/31/2021 1712   APPEARANCEUR CLOUDY (A) 10/31/2021 1712   LABSPEC 1.015 10/31/2021 1712   PHURINE 6.0 10/31/2021 1712   GLUCOSEU 50 (A) 10/31/2021 1712   HGBUR LARGE (A) 10/31/2021 1712   BILIRUBINUR NEGATIVE 10/31/2021 1712   KETONESUR NEGATIVE 10/31/2021 1712   PROTEINUR >=300 (A) 10/31/2021 1712   NITRITE NEGATIVE 10/31/2021 1712   LEUKOCYTESUR LARGE (A) 10/31/2021 1712    Radiological Exams on Admission: DG Chest 2 View  Result Date: 11/25/2021 CLINICAL DATA:  Short of breath, cough and congestion with mild chest pain for 4 days. EXAM: CHEST - 2 VIEW COMPARISON:  11/24/2021 and older exams. FINDINGS: Normal heart, mediastinum and hila. Clear lungs.  No pleural effusion or pneumothorax. Skeletal structures are unremarkable. IMPRESSION: No active cardiopulmonary disease. Electronically Signed   By: Lajean Manes M.D.   On: 11/25/2021 13:24   DG Chest 2 View  Result Date: 11/24/2021 CLINICAL DATA:  Shortness of breath. Chest tightness. Productive cough. EXAM: CHEST - 2 VIEW COMPARISON:  10/20/2021 FINDINGS: Stable enlarged cardiac silhouette. Decreased prominence of the interstitial markings and pulmonary vasculature. No airspace consolidation or pleural fluid. Unremarkable bones. IMPRESSION: No acute abnormality. Stable cardiomegaly. Electronically Signed   By: Claudie Revering M.D.   On: 11/24/2021 17:29    EKG: Independently reviewed.  Sinus rhythm at 79 bpm.  QTc prolonged at 513.  Evidence of LVH with repolarization.  Nonspecific T wave flattening.  Assessment/Plan Principal Problem:   CHF exacerbation (HCC) Active Problems:   Diabetic neuropathy, type II diabetes mellitus ([place])   Type 2 diabetes mellitus with peripheral neuropathy  ([place])   Essential hypertension   Depression   Hyperlipidemia   Gastroparesis   Gastroesophageal reflux disease without esophagitis   Coronary artery disease   BPH (benign prostatic hyperplasia)   Anemia in chronic kidney disease (CKD)   CHF exacerbation Demand ischemia > Patient presenting with 3 to 4 days of worsening shortness of breath with 15 pound weight gain.  Decreased urine output despite continue taking his home oral Lasix. > BNP elevated 1426 and troponin elevated to 132 and then 141 on repeat, demand suspected. > Edema and rales on exam. > Received 40 mg IV Lasix in the ED. - Monitor on telemetry - Continue with 80 mg IV Lasix twice daily - Strict I's and O's, daily weights - Echocardiogram - Fluid restricted diet - Continue home metoprolol - Trend troponin  AKI on CKD 4 Hypokalemia > Creatinine elevated to 3.79 from baseline in the range of 3-3.5.  The setting of CHF exacerbation as above.  Potassium noted to be 3.1. > Received 20 mEq of p.o. potassium in the ED. - We will give additional 40 mEq of p.o. potassium - Check magnesium - Monitor response to diuresis - Trend renal function and electrolytes  Probably QTc > EKG with QTc prolonged at 513. - Repeat EKG in the morning - Hold home Reglan for now  Diabetes Gastroparesis Neuropathy > 10  units nightly at home. - 5 units nightly - SSI - Holding Reglan in the setting of prolonged QTc - Continue amitriptyline for neuropathy   Hypertension - Continue home amlodipine, hydralazine, metoprolol - Lasix as above  Nonobstructive CAD Hyperlipidemia - Continue home atorvastatin - Continue home metoprolol - Continue home Plavix  GERD - Continue home PPI and Pepcid  Anemia > Hemoglobin stable at 8.7 the ED - Trend CBC  Depression - Continue home Lexapro - Trazodone  BPH - Continue home finasteride and tamsulosin   DVT prophylaxis: Lovenox Code Status:   Full Family Communication:  None on  admission Disposition Plan:   Patient is from:  Home  Anticipated DC to:  Home  Anticipated DC date:  1 to 4 days  Anticipated DC barriers: None  Consults called:  None Admission status:  Observation, telemetry  Severity of Illness: The appropriate patient status for this patient is OBSERVATION. Observation status is judged to be reasonable and necessary in order to provide the required intensity of service to ensure the patient's safety. The patient's presenting symptoms, physical exam findings, and initial radiographic and laboratory data in the context of their medical condition is felt to place them at decreased risk for further clinical deterioration. Furthermore, it is anticipated that the patient will be medically stable for discharge from the hospital within 2 midnights of admission.    Marcelyn Bruins MD [facility]  How to contact the [facility] Attending or Consulting provider [place] or covering provider during after hours [place], for this patient?   Check the care team in [facility] and look for a) attending/consulting [facility] provider listed and b) the [facility] team listed Log into [email or web address] and use [place]'s universal password to access. If you do not have the password, please contact the hospital operator. Locate the [facility] provider you are looking for under [facility] and page to a number that you can be directly reached. If you still have difficulty reaching the provider, please page the [facility] (Director on Call) for the Hospitalists listed on amion for assistance.  11/25/2021, 5:12 PM

## 2021-11-26 ENCOUNTER — Observation Stay (HOSPITAL_COMMUNITY): Payer: Medicaid Other

## 2021-11-26 DIAGNOSIS — I5033 Acute on chronic diastolic (congestive) heart failure: Secondary | ICD-10-CM | POA: Diagnosis present

## 2021-11-26 DIAGNOSIS — E876 Hypokalemia: Secondary | ICD-10-CM | POA: Diagnosis present

## 2021-11-26 DIAGNOSIS — N184 Chronic kidney disease, stage 4 (severe): Secondary | ICD-10-CM | POA: Diagnosis present

## 2021-11-26 DIAGNOSIS — R2689 Other abnormalities of gait and mobility: Secondary | ICD-10-CM | POA: Diagnosis present

## 2021-11-26 DIAGNOSIS — E1143 Type 2 diabetes mellitus with diabetic autonomic (poly)neuropathy: Secondary | ICD-10-CM | POA: Diagnosis present

## 2021-11-26 DIAGNOSIS — I130 Hypertensive heart and chronic kidney disease with heart failure and stage 1 through stage 4 chronic kidney disease, or unspecified chronic kidney disease: Secondary | ICD-10-CM | POA: Diagnosis present

## 2021-11-26 DIAGNOSIS — E11319 Type 2 diabetes mellitus with unspecified diabetic retinopathy without macular edema: Secondary | ICD-10-CM | POA: Diagnosis present

## 2021-11-26 DIAGNOSIS — E1122 Type 2 diabetes mellitus with diabetic chronic kidney disease: Secondary | ICD-10-CM | POA: Diagnosis present

## 2021-11-26 DIAGNOSIS — I2510 Atherosclerotic heart disease of native coronary artery without angina pectoris: Secondary | ICD-10-CM | POA: Diagnosis present

## 2021-11-26 DIAGNOSIS — R338 Other retention of urine: Secondary | ICD-10-CM

## 2021-11-26 DIAGNOSIS — N179 Acute kidney failure, unspecified: Secondary | ICD-10-CM | POA: Diagnosis present

## 2021-11-26 DIAGNOSIS — K219 Gastro-esophageal reflux disease without esophagitis: Secondary | ICD-10-CM | POA: Diagnosis present

## 2021-11-26 DIAGNOSIS — J9601 Acute respiratory failure with hypoxia: Secondary | ICD-10-CM | POA: Diagnosis present

## 2021-11-26 DIAGNOSIS — N401 Enlarged prostate with lower urinary tract symptoms: Secondary | ICD-10-CM | POA: Diagnosis present

## 2021-11-26 DIAGNOSIS — D631 Anemia in chronic kidney disease: Secondary | ICD-10-CM | POA: Diagnosis present

## 2021-11-26 DIAGNOSIS — E785 Hyperlipidemia, unspecified: Secondary | ICD-10-CM | POA: Diagnosis present

## 2021-11-26 DIAGNOSIS — Z79899 Other long term (current) drug therapy: Secondary | ICD-10-CM | POA: Diagnosis not present

## 2021-11-26 DIAGNOSIS — I2489 Other forms of acute ischemic heart disease: Secondary | ICD-10-CM | POA: Diagnosis present

## 2021-11-26 DIAGNOSIS — F32A Depression, unspecified: Secondary | ICD-10-CM | POA: Diagnosis present

## 2021-11-26 DIAGNOSIS — E1142 Type 2 diabetes mellitus with diabetic polyneuropathy: Secondary | ICD-10-CM | POA: Diagnosis present

## 2021-11-26 DIAGNOSIS — N138 Other obstructive and reflux uropathy: Secondary | ICD-10-CM | POA: Diagnosis present

## 2021-11-26 DIAGNOSIS — I509 Heart failure, unspecified: Secondary | ICD-10-CM | POA: Diagnosis not present

## 2021-11-26 DIAGNOSIS — Z794 Long term (current) use of insulin: Secondary | ICD-10-CM | POA: Diagnosis not present

## 2021-11-26 DIAGNOSIS — I422 Other hypertrophic cardiomyopathy: Secondary | ICD-10-CM | POA: Diagnosis not present

## 2021-11-26 DIAGNOSIS — N3090 Cystitis, unspecified without hematuria: Secondary | ICD-10-CM | POA: Diagnosis present

## 2021-11-26 DIAGNOSIS — K3184 Gastroparesis: Secondary | ICD-10-CM | POA: Diagnosis present

## 2021-11-26 LAB — URINALYSIS, ROUTINE W REFLEX MICROSCOPIC
Bilirubin Urine: NEGATIVE
Glucose, UA: NEGATIVE mg/dL
Ketones, ur: NEGATIVE mg/dL
Nitrite: NEGATIVE
Protein, ur: 100 mg/dL — AB
Specific Gravity, Urine: 1.005 (ref 1.005–1.030)
pH: 6 (ref 5.0–8.0)

## 2021-11-26 LAB — CBC
HCT: 23.2 % — ABNORMAL LOW (ref 39.0–52.0)
Hemoglobin: 7.8 g/dL — ABNORMAL LOW (ref 13.0–17.0)
MCH: 29.4 pg (ref 26.0–34.0)
MCHC: 33.6 g/dL (ref 30.0–36.0)
MCV: 87.5 fL (ref 80.0–100.0)
Platelets: 293 10*3/uL (ref 150–400)
RBC: 2.65 MIL/uL — ABNORMAL LOW (ref 4.22–5.81)
RDW: 15.3 % (ref 11.5–15.5)
WBC: 5.9 10*3/uL (ref 4.0–10.5)
nRBC: 0 % (ref 0.0–0.2)

## 2021-11-26 LAB — COMPREHENSIVE METABOLIC PANEL
ALT: 10 U/L (ref 0–44)
AST: 18 U/L (ref 15–41)
Albumin: 2.7 g/dL — ABNORMAL LOW (ref 3.5–5.0)
Alkaline Phosphatase: 68 U/L (ref 38–126)
Anion gap: 10 (ref 5–15)
BUN: 36 mg/dL — ABNORMAL HIGH (ref 8–23)
CO2: 21 mmol/L — ABNORMAL LOW (ref 22–32)
Calcium: 8.2 mg/dL — ABNORMAL LOW (ref 8.9–10.3)
Chloride: 109 mmol/L (ref 98–111)
Creatinine, Ser: 3.9 mg/dL — ABNORMAL HIGH (ref 0.61–1.24)
GFR, Estimated: 17 mL/min — ABNORMAL LOW (ref >60)
Glucose, Bld: 144 mg/dL — ABNORMAL HIGH (ref 70–99)
Potassium: 3.6 mmol/L (ref 3.5–5.1)
Sodium: 140 mmol/L (ref 135–145)
Total Bilirubin: 0.4 mg/dL (ref 0.3–1.2)
Total Protein: 6.4 g/dL — ABNORMAL LOW (ref 6.5–8.1)

## 2021-11-26 LAB — ECHOCARDIOGRAM COMPLETE
Area-P 1/2: 3.45 cm2
Calc EF: 58 %
Height: 76 in
S' Lateral: 3.1 cm
Single Plane A2C EF: 58.4 %
Single Plane A4C EF: 57 %
Weight: 3467.39 oz

## 2021-11-26 LAB — GLUCOSE, CAPILLARY
Glucose-Capillary: 91 mg/dL (ref 70–99)
Glucose-Capillary: 163 mg/dL — ABNORMAL HIGH (ref 70–99)
Glucose-Capillary: 137 mg/dL — ABNORMAL HIGH (ref 70–99)

## 2021-11-26 MED ORDER — POTASSIUM CHLORIDE CRYS ER 20 MEQ PO TBCR
40.0000 meq | EXTENDED_RELEASE_TABLET | Freq: Once | ORAL | Status: AC
  Administered 2021-11-26: 40 meq via ORAL
  Filled 2021-11-26: qty 2

## 2021-11-26 MED ORDER — HYDROXYZINE HCL 10 MG PO TABS
10.0000 mg | ORAL_TABLET | Freq: Three times a day (TID) | ORAL | Status: DC | PRN
  Administered 2021-11-26 – 2021-11-28 (×4): 10 mg via ORAL
  Filled 2021-11-26 (×6): qty 1

## 2021-11-26 MED ORDER — FAMOTIDINE 20 MG PO TABS
20.0000 mg | ORAL_TABLET | Freq: Every day | ORAL | Status: DC
  Administered 2021-11-27 – 2021-11-29 (×3): 20 mg via ORAL
  Filled 2021-11-26 (×3): qty 1

## 2021-11-26 MED ORDER — SODIUM CHLORIDE 0.9 % IV SOLN
1.0000 g | INTRAVENOUS | Status: DC
  Administered 2021-11-26 – 2021-11-27 (×2): 1 g via INTRAVENOUS
  Filled 2021-11-26 (×2): qty 10

## 2021-11-26 MED ORDER — METHOCARBAMOL 1000 MG/10ML IJ SOLN
500.0000 mg | Freq: Three times a day (TID) | INTRAVENOUS | Status: DC | PRN
  Administered 2021-11-26: 500 mg via INTRAVENOUS
  Filled 2021-11-26: qty 500

## 2021-11-26 MED ORDER — MAGNESIUM SULFATE 2 GM/50ML IV SOLN
2.0000 g | Freq: Once | INTRAVENOUS | Status: AC
  Administered 2021-11-26: 2 g via INTRAVENOUS
  Filled 2021-11-26: qty 50

## 2021-11-26 NOTE — Progress Notes (Signed)
Echocardiogram 2D Echocardiogram has been performed.  Oneal Deputy Jamel Dunton RDCS 11/26/2021, 12:32 PM

## 2021-11-26 NOTE — Progress Notes (Signed)
[facility] PROGRESS NOTE  Ryan Jenkins (DOB: Mar 08, 1960) XLK:[id number] PCP: [name], MD Outpatient Specialists: Urology, [name]; Cardiology, [name]  Brief Narrative: KEEVAN Jenkins is a 61 y.o. male with a history of T2DM with gastroparesis and neuropathy, CAD, chronic HFpEF, HTN, stage IV CKD, BPH s/p TURP 10/16/21 who presented to the ED on 11/25/2021 for the second time in 2 days with worsening shortness of breath and abrupt unintentional weight gain of 15 pounds over the previous 3 days with decreased urine output despite taking lasix. Vital signs were stable, though he appeared volume overloaded with mild elevation in creatinine at 3.79 compared to baseline 3-3.5, hypokalemia (K 3.1), with stable anemia (Hgb 8.7 g/dl). CXR showed no acute abnormality. Lasix and potassium administered and he was admitted for acute on chronic HFpEF with echo pending.   Subjective: Confirms relatively abrupt (days) onset of leg swelling and very decreased UOP despite taking lasix as he was previously. No chest pain, palpitations. Feels winded after much less exertion than is his already limited baseline. Retained nearly 500cc urine this morning, able to urinate but straining at times. No hematuria or other bleeding.  Objective: BP (!) 168/82 (BP Location: Right Arm)   Pulse (!) 108   Temp 99.1 F (37.3 C) (Oral)   Resp 20   Ht '6\' 4"'$  (1.93 m)   Wt 98.3 kg   SpO2 95%   BMI 26.38 kg/m   Gen: Tired-appearing male in no distress Pulm: Clear, nonlabored  CV: RRR with 2+ dependent edema, +JVD GI: Soft, NT, ND, +BS  Neuro: Alert and oriented. No new focal deficits. Ext: Warm, no deformities Skin: No rashes, lesions or ulcers on visualized skin   Assessment & Plan: Principal Problem:   CHF exacerbation (HCC) Active Problems:   Diabetic neuropathy, type II diabetes mellitus (HCC)   Type 2 diabetes mellitus with peripheral neuropathy (HCC)   Essential hypertension    Depression   Hyperlipidemia   Gastroparesis   Gastroesophageal reflux disease without esophagitis   Acute renal failure superimposed on stage 4 chronic kidney disease (HCC)   Coronary artery disease   BPH (benign prostatic hyperplasia)   Anemia in chronic kidney disease (CKD)  Acute on chronic HFpEF: Cardiology note 10/27/2021 recorded weight 91.4kg and said to be euvolemic at that time. He's 98.4kg here. - Repeat echo as cause for deterioration remains unclear.  - Continue lasix '80mg'$  IV BID. Remains clinically overloaded, though pulmonary symptoms and exam are clear. Creatinine stable. During last admission required cardiology and nephrology consults. Pending his response, may need their involvement.  - Monitor I/O, daily weights - Continue metoprolol.  Mild AKI on stage IV CKD: Cr still below discharge value from last CHF admission of 4.1.  - Monitor UOP.  - No ACE/ARB.  Hypokalemia: Supplemented and remains low normal with plans for aggressive loop diuretic use.  - Give 40 mEq po x1. Mag is 1.7, give 2g IV.   Acute urinary retention, BPH s/p TURP 10/2 by [name]:  - Check renal U/S and serial bladder scans with plans to insert foley if retaining. Discussed with Dr. Gloriann Loan, urology (not formally consulted).  - Continue finasteride, tamsulosin - Abx as below  UTI: Based on pt report of dysuria, increased straining, and pyuria on UA.  - Start ceftriaxone pending UCx results.   Anemia of CKD: Hgb slightly below baseline.  - Check T&S, continue monitoring.  - Transfuse if bleeding develops.  T2DM: HbA1c 8.4%. - Continue insulins  as ordered - Continue amitriptyline for peripheral neuropathy. Add robaxin prn for muscle spasms  QT interval prolongation:  - Continue telemetry monitoring. Supp K and Mg as above. Avoid provocative agents.   CAD, HTN, HLD: Nuc stress test May 2023 low risk study with normal LVSF, wall motion, no perfusion defects. [place] 2021 revealed 60% RCA lesion. -  Continue metoprolol, plavix, statin, norvasc and hydralazine  GERD:  - Continue home meds, dose reduce pepcid due to CrCl < 71m/min.   Hypokalemia: Supplemented - Monitor  Gait instability: baseline gait impairment, worsened acutely.  - PT/OT   RPatrecia Pour MD [facility] [email or web address] 11/26/2021, 1:12 PM

## 2021-11-26 NOTE — Plan of Care (Signed)
  Problem: Health Behavior/Discharge Planning: Goal: Ability to identify and utilize available resources and services will improve Outcome: Progressing Goal: Ability to manage health-related needs will improve Outcome: Progressing   Problem: Education: Goal: Knowledge of General Education information will improve Description: Including pain rating scale, medication(s)/side effects and non-pharmacologic comfort measures Outcome: Progressing   Problem: Clinical Measurements: Goal: Respiratory complications will improve Outcome: Progressing   Problem: Clinical Measurements: Goal: Cardiovascular complication will be avoided Outcome: Progressing   Problem: Activity: Goal: Risk for activity intolerance will decrease Outcome: Progressing   Problem: Nutrition: Goal: Adequate nutrition will be maintained Outcome: Progressing   Problem: Elimination: Goal: Will not experience complications related to urinary retention Outcome: Progressing   Problem: Safety: Goal: Ability to remain free from injury will improve Outcome: Progressing

## 2021-11-26 NOTE — Progress Notes (Signed)
C/O feeling a little SOB. O2 sats in lower 90's on RA. Placed on O2 @ 2L [place]. Sats now = 94 - 96%. Will cont to monitor.

## 2021-11-26 NOTE — Evaluation (Signed)
Physical Therapy Evaluation Patient Details Name: Ryan Jenkins MRN: [id number] DOB: December 17, 1960 Today's Date: 11/26/2021  History of Present Illness  Pt is a 61 y.o. male who presented 11/24/21 with worsening shortness of breath and weight gain. Pt admitted with acute on chronic HFpEF, potential UTI, and mild AKI on stage IV CKD. PMH includes: diabetes with neuropathy and gastroparesis, HTN, CKD 4, depression, anxiety, HLD, GERD, CHF, CAD, anemia   Clinical Impression  Pt presents with condition above and deficits mentioned below, see PT Problem List. PTA, he was living with his brother in a 1-level apartment with a level entry and had a sitter come to do household chores 3x/week. Pt was mod I using a RW for mobility. Currently, pt is demonstrating deficits in gross strength, aerobic and muscular endurance, and balance and is limited by fatigue and pain. Pt was only able to tolerate transferring to stand and taking a few steps along EOB with a RW at a min guard assist level before needing to return to bed due to pain and fatigue. Pt is requiring 2L of O2 via [place] to maintain sats >/= 89% at this time. Anticipate pt will progress well, thus recommending follow-up with HHPT at d/c. Will continue to follow acutely.       Recommendations for follow up therapy are one component of a multi-disciplinary discharge planning process, led by the attending physician.  Recommendations may be updated based on patient status, additional functional criteria and insurance authorization.  Follow Up Recommendations Home health PT      Assistance Recommended at Discharge Intermittent Supervision/Assistance  Patient can return home with the following  A little help with walking and/or transfers;A little help with bathing/dressing/bathroom;Assistance with cooking/housework;Assist for transportation    Equipment Recommendations None recommended by PT  Recommendations for Other Services       Functional Status  Assessment Patient has had a recent decline in their functional status and demonstrates the ability to make significant improvements in function in a reasonable and predictable amount of time.     Precautions / Restrictions Precautions Precautions: Fall;Other (comment) Precaution Comments: watch SpO2 Restrictions Weight Bearing Restrictions: No      Mobility  Bed Mobility Overal bed mobility: Needs Assistance Bed Mobility: Supine to Sit, Sit to Supine     Supine to sit: Supervision, HOB elevated Sit to supine: Supervision, HOB elevated   General bed mobility comments: Supervision for safety, extra time, HOB elevated    Transfers Overall transfer level: Needs assistance Equipment used: Rolling walker (2 wheels) Transfers: Sit to/from Stand Sit to Stand: Min guard           General transfer comment: x2 sit to stand reps from EOB, 1x with EOB elevated, min guard for safety, no LOB but extra effort to stand.    Ambulation/Gait Ambulation/Gait assistance: Min guard Gait Distance (Feet): 3 Feet Assistive device: Rolling walker (2 wheels) Gait Pattern/deviations: Decreased stride length Gait velocity: reduced Gait velocity interpretation: <1.31 ft/sec, indicative of household ambulator   General Gait Details: Pt with slow, small side steps along EOB, no LOB, min guard for safety. Pt declining further mobility due to pain, nausea, and lightheadedness  Stairs            Wheelchair Mobility    Modified Rankin (Stroke Patients Only)       Balance Overall balance assessment: Needs assistance Sitting-balance support: No upper extremity supported, Feet supported Sitting balance-Leahy Scale: Good     Standing balance support: Bilateral upper extremity  supported, During functional activity, Reliant on assistive device for balance Standing balance-Leahy Scale: Poor Standing balance comment: Reliant on RW                             Pertinent  Vitals/Pain Pain Assessment Pain Assessment: Faces Faces Pain Scale: Hurts even more Pain Location: headache, legs Pain Descriptors / Indicators: Discomfort, Grimacing, Headache Pain Intervention(s): Limited activity within patient's tolerance, Monitored during session, Repositioned, Other (comment) (reports he received tylenol earlier)    Home Living Family/patient expects to be discharged to:: Private residence Living Arrangements: Other relatives (brother) Available Help at Discharge: Family;Personal care attendant;Available PRN/intermittently (brother works, Actuary comes MWF from 10-2) Type of Home: Apartment Home Access: Level entry       Home Layout: One level [place]: Conservation officer, nature (2 wheels);Rollator (4 wheels);BSC/3in1;Tub bench;Wheelchair - manual      Prior Function Prior Level of Function : Independent/Modified Independent             Mobility Comments: Mod I using RW or rollator for mobility, prefers RW ADLs Comments: Actuary that comes MWF does household chores, but pt mod I with ADLs     Hand Dominance   Dominant Hand: Right    Extremity/Trunk Assessment   Upper Extremity Assessment Upper Extremity Assessment: Defer to OT evaluation    Lower Extremity Assessment Lower Extremity Assessment: RLE deficits/detail;LLE deficits/detail RLE Deficits / Details: MMT scores of 4+ knee extension, 4 ankle dorsiflexion; hx of peripheral neuropathy RLE Sensation: history of peripheral neuropathy LLE Deficits / Details: MMT scores of 4+ knee extension, 4 ankle dorsiflexion; hx of peripheral neuropathy LLE Sensation: history of peripheral neuropathy    Cervical / Trunk Assessment Cervical / Trunk Assessment: Normal  Communication   Communication: No difficulties  Cognition Arousal/Alertness: Awake/alert Behavior During Therapy: Flat affect Overall Cognitive Status: Within Functional Limits for tasks assessed                                  General Comments: Pt aware of his deficits and safety concerns. Flat affect during, appropriate considering his report of pain.        General Comments General comments (skin integrity, edema, etc.): SpO2 down to 86% on RA sitting at rest, >/= 89% on 2L with mobility and >/= 92% on 2L at rest; SBP in 140s sitting EOB after standing and feeling lightheaded    Exercises     Assessment/Plan    PT Assessment Patient needs continued PT services  PT Problem List Decreased strength;Decreased activity tolerance;Decreased balance;Decreased mobility;Cardiopulmonary status limiting activity       PT Treatment Interventions DME instruction;Gait training;Functional mobility training;Therapeutic activities;Therapeutic exercise;Balance training;Neuromuscular re-education;Patient/family education    PT Goals (Current goals can be found in the Care Plan section)  Acute Rehab PT Goals Patient Stated Goal: to feel better PT Goal Formulation: With patient Time For Goal Achievement: 12/10/21 Potential to Achieve Goals: Good    Frequency Min 3X/week     Co-evaluation               AM-PAC PT "6 Clicks" Mobility  Outcome Measure Help needed turning from your back to your side while in a flat bed without using bedrails?: None Help needed moving from lying on your back to sitting on the side of a flat bed without using bedrails?: A Little Help needed moving to and from a  bed to a chair (including a wheelchair)?: A Little Help needed standing up from a chair using your arms (e.g., wheelchair or bedside chair)?: A Little Help needed to walk in hospital room?: Total Help needed climbing 3-5 steps with a railing? : Total 6 Click Score: 15    End of Session Equipment Utilized During Treatment: Oxygen Activity Tolerance: Patient limited by pain;Patient limited by fatigue Patient left: in bed;with call bell/phone within reach;with bed alarm set   PT Visit Diagnosis: Unsteadiness on feet  (R26.81);Other abnormalities of gait and mobility (R26.89);Muscle weakness (generalized) (M62.81);Difficulty in walking, not elsewhere classified (R26.2)    Time: 0141-0301 PT Time Calculation (min) (ACUTE ONLY): 20 min   Charges:   PT Evaluation $PT Eval Moderate Complexity: 1 Mod          Moishe Spice, PT, DPT Acute Rehabilitation Services  Office: [phone]   Orvan Falconer 11/26/2021, 4:28 PM

## 2021-11-26 NOTE — Progress Notes (Signed)
Pt. complained of dysuria and suprapubic pain, bladder scan showed 490 mL.  Naus asked to continue Proscar and Flomax already ordered.  Urinalysis ordered to rule out UTI.

## 2021-11-26 NOTE — Progress Notes (Signed)
Complaining of dysuria, T=99.6, pain on suprapubic area, bladder scan done = 490 ml. patient had TURP on 10/2021. Ryan Jenkins informed.

## 2021-11-27 DIAGNOSIS — N179 Acute kidney failure, unspecified: Secondary | ICD-10-CM | POA: Diagnosis not present

## 2021-11-27 DIAGNOSIS — N401 Enlarged prostate with lower urinary tract symptoms: Secondary | ICD-10-CM | POA: Diagnosis not present

## 2021-11-27 DIAGNOSIS — I5033 Acute on chronic diastolic (congestive) heart failure: Secondary | ICD-10-CM | POA: Diagnosis not present

## 2021-11-27 DIAGNOSIS — N184 Chronic kidney disease, stage 4 (severe): Secondary | ICD-10-CM | POA: Diagnosis not present

## 2021-11-27 LAB — BASIC METABOLIC PANEL
Anion gap: 12 (ref 5–15)
BUN: 36 mg/dL — ABNORMAL HIGH (ref 8–23)
CO2: 17 mmol/L — ABNORMAL LOW (ref 22–32)
Calcium: 8.2 mg/dL — ABNORMAL LOW (ref 8.9–10.3)
Chloride: 109 mmol/L (ref 98–111)
Creatinine, Ser: 3.92 mg/dL — ABNORMAL HIGH (ref 0.61–1.24)
GFR, Estimated: 17 mL/min — ABNORMAL LOW (ref >60)
Glucose, Bld: 95 mg/dL (ref 70–99)
Potassium: 3.6 mmol/L (ref 3.5–5.1)
Sodium: 138 mmol/L (ref 135–145)

## 2021-11-27 LAB — HEMOGLOBIN AND HEMATOCRIT, BLOOD
HCT: 26.8 % — ABNORMAL LOW (ref 39.0–52.0)
Hemoglobin: 8.7 g/dL — ABNORMAL LOW (ref 13.0–17.0)

## 2021-11-27 LAB — GLUCOSE, CAPILLARY
Glucose-Capillary: 107 mg/dL — ABNORMAL HIGH (ref 70–99)
Glucose-Capillary: 101 mg/dL — ABNORMAL HIGH (ref 70–99)
Glucose-Capillary: 174 mg/dL — ABNORMAL HIGH (ref 70–99)
Glucose-Capillary: 142 mg/dL — ABNORMAL HIGH (ref 70–99)

## 2021-11-27 MED ORDER — GLUCERNA SHAKE PO LIQD
237.0000 mL | Freq: Three times a day (TID) | ORAL | Status: DC
  Administered 2021-11-27 – 2021-11-29 (×6): 237 mL via ORAL

## 2021-11-27 MED ORDER — POTASSIUM CHLORIDE CRYS ER 20 MEQ PO TBCR
40.0000 meq | EXTENDED_RELEASE_TABLET | Freq: Once | ORAL | Status: AC
  Administered 2021-11-27: 40 meq via ORAL
  Filled 2021-11-27: qty 2

## 2021-11-27 MED ORDER — ONDANSETRON 4 MG PO TBDP: 4.0000 mg | ORAL_TABLET | Freq: Three times a day (TID) | ORAL | Status: DC | PRN

## 2021-11-27 NOTE — Progress Notes (Signed)
Physical Therapy Treatment Patient Details Name: Ryan Jenkins MRN: [id number] DOB: 17-Sep-1960 Today's Date: 11/27/2021   History of Present Illness Pt is a 61 y.o. male who presented 11/24/21 with worsening shortness of breath and weight gain. Pt admitted with acute on chronic HFpEF, potential UTI, and mild AKI on stage IV CKD. PMH includes: diabetes with neuropathy and gastroparesis, HTN, CKD 4, depression, anxiety, HLD, GERD, CHF, CAD, anemia    PT Comments    Patient progressing with mobility and able to walk the hallway with assistance.  Still limited by reported swelling in his feet and taking shorter strides.  Brother present during session and supportive.  He is appropriate for follow up HHPT.  PT will continue to follow until d/c.    Recommendations for follow up therapy are one component of a multi-disciplinary discharge planning process, led by the attending physician.  Recommendations may be updated based on patient status, additional functional criteria and insurance authorization.  Follow Up Recommendations  Home health PT     Assistance Recommended at Discharge Intermittent Supervision/Assistance  Patient can return home with the following A little help with walking and/or transfers;A little help with bathing/dressing/bathroom;Assistance with cooking/housework;Assist for transportation   Equipment Recommendations  Rolling walker (2 wheels)    Recommendations for Other Services       Precautions / Restrictions Precautions Precautions: Fall Precaution Comments: watch SpO2     Mobility  Bed Mobility Overal bed mobility: Needs Assistance       Supine to sit: Supervision, HOB elevated Sit to supine: Supervision, HOB elevated   General bed mobility comments: assist for lines/safety, increased time and heavy UE support needed    Transfers Overall transfer level: Needs assistance Equipment used: Rolling walker (2 wheels) Transfers: Sit to/from Stand Sit to  Stand: Min guard           General transfer comment: up from EOB with A for safety    Ambulation/Gait Ambulation/Gait assistance: Supervision, Min guard Gait Distance (Feet): 200 Feet Assistive device: Rolling walker (2 wheels) Gait Pattern/deviations: Step-through pattern, Wide base of support, Decreased stride length       General Gait Details: Discussed taking longer strides when his feet don't feel swollen and tight; assist for balance/safety, no LOB during hallway ambulation with RW   Stairs             Wheelchair Mobility    Modified Rankin (Stroke Patients Only)       Balance Overall balance assessment: Needs assistance Sitting-balance support: Feet supported Sitting balance-Leahy Scale: Good     Standing balance support: Bilateral upper extremity supported Standing balance-Leahy Scale: Poor Standing balance comment: UE support for balance though performing sit<>stand at bedside no walker, but legs braced against bed                            Cognition Arousal/Alertness: Awake/alert Behavior During Therapy: Flat affect Overall Cognitive Status: Within Functional Limits for tasks assessed                                          Exercises Other Exercises Other Exercises: sit<>stand x 5    General Comments General comments (skin integrity, edema, etc.): SpO2 93% after ambulating on RW, HR 90's with ambulation; brother arrived and supportive during session      Pertinent Vitals/Pain Pain Assessment  Pain Assessment: No/denies pain    Home Living                          Prior Function            PT Goals (current goals can now be found in the care plan section) Progress towards PT goals: Progressing toward goals    Frequency    Min 3X/week      PT Plan Current plan remains appropriate    Co-evaluation              AM-PAC PT "6 Clicks" Mobility   Outcome Measure  Help needed  turning from your back to your side while in a flat bed without using bedrails?: None Help needed moving from lying on your back to sitting on the side of a flat bed without using bedrails?: A Little Help needed moving to and from a bed to a chair (including a wheelchair)?: A Little Help needed standing up from a chair using your arms (e.g., wheelchair or bedside chair)?: A Little Help needed to walk in hospital room?: A Little Help needed climbing 3-5 steps with a railing? : Total 6 Click Score: 17    End of Session Equipment Utilized During Treatment: Gait belt Activity Tolerance: Patient tolerated treatment well Patient left: in bed;with call bell/phone within reach;with family/visitor present   PT Visit Diagnosis: Unsteadiness on feet (R26.81);Other abnormalities of gait and mobility (R26.89);Muscle weakness (generalized) (M62.81);Difficulty in walking, not elsewhere classified (R26.2)     Time: 1447-1510 PT Time Calculation (min) (ACUTE ONLY): 23 min  Charges:  $Gait Training: 8-22 mins $Therapeutic Activity: 8-22 mins                     Magda Kiel, PT Acute Rehabilitation Services Office:[phone] 11/27/2021    Reginia Naas 11/27/2021, 4:11 PM

## 2021-11-27 NOTE — Evaluation (Signed)
Occupational Therapy Evaluation Patient Details Name: Ryan Jenkins MRN: [id number] DOB: [birth year] Today's Date: 11/27/2021   History of Present Illness Pt is a 61 y.o. male who presented 11/24/21 with worsening shortness of breath and weight gain. Pt admitted with acute on chronic HFpEF, potential UTI, and mild AKI on stage IV CKD. PMH includes: diabetes with neuropathy and gastroparesis, HTN, CKD 4, depression, anxiety, HLD, GERD, CHF, CAD, anemia   Clinical Impression   Pt reports independence at baseline with ADLs, uses w/c and rollator at home for functional mobility, lives with brother but has an aide that comes MWF x4 hours to assist with household tasks. Pt currently needing min guard A for ADLs, mod I for bed mobility, and min guard-min A for transfers with RW. Pt SpO2 90% at lowest on RA, recovering to 94% with seated rest break. Pt presenting with impairments listed below, will follow acutely. Recommend HHOT at d/c.      Recommendations for follow up therapy are one component of a multi-disciplinary discharge planning process, led by the attending physician.  Recommendations may be updated based on patient status, additional functional criteria and insurance authorization.   Follow Up Recommendations  Home health OT     Assistance Recommended at Discharge Intermittent Supervision/Assistance  Patient can return home with the following A little help with bathing/dressing/bathroom;A little help with walking and/or transfers;Direct supervision/assist for medications management;Direct supervision/assist for financial management;Assist for transportation;Help with stairs or ramp for entrance    Functional Status Assessment  Patient has had a recent decline in their functional status and demonstrates the ability to make significant improvements in function in a reasonable and predictable amount of time.  Equipment Recommendations  None recommended by OT;Other (comment) (pt has all  needed DME)    Recommendations for Other Services PT consult     Precautions / Restrictions Precautions Precautions: Fall;Other (comment) Precaution Comments: watch SpO2 Restrictions Weight Bearing Restrictions: No      Mobility Bed Mobility Overal bed mobility: Modified Independent                  Transfers Overall transfer level: Needs assistance Equipment used: Rolling walker (2 wheels) Transfers: Sit to/from Stand Sit to Stand: Min guard, Min assist           General transfer comment: min A from lower toilet surface      Balance Overall balance assessment: Needs assistance Sitting-balance support: No upper extremity supported, Feet supported Sitting balance-Leahy Scale: Good     Standing balance support: Bilateral upper extremity supported, During functional activity, Reliant on assistive device for balance Standing balance-Leahy Scale: Poor Standing balance comment: Reliant on RW                           ADL either performed or assessed with clinical judgement   ADL Overall ADL's : Needs assistance/impaired Eating/Feeding: Set up;Sitting   Grooming: Min guard   Upper Body Bathing: Min guard   Lower Body Bathing: Min guard   Upper Body Dressing : Min guard Upper Body Dressing Details (indicate cue type and reason): donning gown Lower Body Dressing: Min guard Lower Body Dressing Details (indicate cue type and reason): pulling up socks Toilet Transfer: Min guard   Toileting- Clothing Manipulation and Hygiene: Min guard       Functional mobility during ADLs: Min guard       Vision   Vision Assessment?: No apparent visual deficits  Perception Perception Perception Tested?: No   Praxis Praxis Praxis tested?: Not tested    Pertinent Vitals/Pain Pain Assessment Pain Assessment: Faces Pain Score: 5  Faces Pain Scale: Hurts little more Pain Location: headache Pain Descriptors / Indicators: Discomfort, Grimacing,  Headache Pain Intervention(s): Limited activity within patient's tolerance, Monitored during session, Repositioned     Hand Dominance Right   Extremity/Trunk Assessment Upper Extremity Assessment Upper Extremity Assessment: Generalized weakness (reports hx of peripheral neuropathy)   Lower Extremity Assessment Lower Extremity Assessment: Defer to PT evaluation   Cervical / Trunk Assessment Cervical / Trunk Assessment: Normal   Communication Communication Communication: No difficulties   Cognition Arousal/Alertness: Awake/alert Behavior During Therapy: Flat affect Overall Cognitive Status: Within Functional Limits for tasks assessed                                       General Comments  SpO2 90% at lowest on RA    Exercises     Shoulder Instructions      Home Living Family/patient expects to be discharged to:: Private residence Living Arrangements: Other (Comment) (brother) Available Help at Discharge: Family;Personal care attendant;Available PRN/intermittently (sitter MWF 10-2) Type of Home: Apartment Home Access: Level entry     Home Layout: One level     Bathroom Shower/Tub: Teacher, early years/pre: Standard Bathroom Accessibility: Yes How Accessible: Accessible via wheelchair Home Equipment: Rolling Walker (2 wheels);Rollator (4 wheels);BSC/3in1;Tub bench;Wheelchair - manual          Prior Functioning/Environment Prior Level of Function : Independent/Modified Independent             Mobility Comments: reports using w/c at home, Rollator in community ADLs Comments: Kennon Holter that comes MWF does household chores, but pt mod I with ADLs        OT Problem List: Decreased strength;Decreased range of motion;Decreased activity tolerance;Impaired balance (sitting and/or standing);Decreased cognition;Decreased safety awareness;Cardiopulmonary status limiting activity      OT Treatment/Interventions: Self-care/ADL  training;Therapeutic exercise;DME and/or AE instruction;Therapeutic activities;Patient/family education;Balance training;Energy conservation    OT Goals(Current goals can be found in the care plan section) Acute Rehab OT Goals Patient Stated Goal: none stated OT Goal Formulation: With patient Time For Goal Achievement: 12/11/21 Potential to Achieve Goals: Good ADL Goals Pt Will Perform Upper Body Dressing: standing;Independently Pt Will Perform Lower Body Dressing: Independently;sitting/lateral leans;sit to/from stand Pt Will Transfer to Toilet: Independently;ambulating;regular height toilet Pt Will Perform Tub/Shower Transfer: Tub transfer;Shower transfer;Independently;ambulating  OT Frequency: Min 2X/week    Co-evaluation              AM-PAC OT "6 Clicks" Daily Activity     Outcome Measure Help from another person eating meals?: None Help from another person taking care of personal grooming?: A Little Help from another person toileting, which includes using toliet, bedpan, or urinal?: A Little Help from another person bathing (including washing, rinsing, drying)?: A Little Help from another person to put on and taking off regular upper body clothing?: A Little Help from another person to put on and taking off regular lower body clothing?: A Little 6 Click Score: 19   End of Session Equipment Utilized During Treatment: Gait belt;Rolling walker (2 wheels) Nurse Communication: Mobility status  Activity Tolerance: Patient tolerated treatment well Patient left: with call bell/phone within reach;in chair  OT Visit Diagnosis: Unsteadiness on feet (R26.81);Other abnormalities of gait and mobility (R26.89);Muscle weakness (generalized) (M62.81)  Time: 3943-2003 OT Time Calculation (min): 30 min Charges:  OT General Charges $OT Visit: 1 Visit OT Evaluation $OT Eval Moderate Complexity: 1 Mod OT Treatments $Self Care/Home Management : 8-22 mins  Lynnda Child,  OTD, OTR/L Acute Rehab [phone]) [phone] [place] 11/27/2021, 12:42 PM

## 2021-11-27 NOTE — Progress Notes (Signed)
[facility] PROGRESS NOTE  Ryan Jenkins (DOB: 02/19/1960) DZH:[id number] PCP: Ryan Coma., MD Outpatient Specialists: Ryan Jenkins; Ryan Jenkins  Brief Narrative: Ryan Jenkins is a 61 y.o. male with a history of T2DM with gastroparesis and neuropathy, CAD, chronic HFpEF, HTN, stage IV CKD, BPH s/p TURP 10/16/21 who presented to the ED on 11/25/2021 for the second time in 2 days with worsening shortness of breath and abrupt unintentional weight gain of 15 pounds over the previous 3 days with decreased urine output despite taking lasix. Vital signs were stable, though he appeared volume overloaded with mild elevation in creatinine at 3.79 compared to baseline 3-3.5, hypokalemia (K 3.1), with stable anemia (Hgb 8.7 g/dl). CXR showed no acute abnormality. Lasix and potassium administered and he was admitted for acute on chronic HFpEF with echo pending.   Subjective: Swelling significantly improving since admission, shortness of breath decreasing as well, still dyspneic with exertion more than baseline.   Objective: BP (!) 148/79 (BP Location: Left Arm)   Pulse 89   Temp 98.4 F (36.9 C) (Oral)   Resp 20   Ht '6\' 4"'$  (1.93 m)   Wt 95.2 kg   SpO2 95%   BMI 25.54 kg/m   Gen: No distress sitting in chair Pulm: Slight crackles at bases, nonlabored at rest  CV: RRR, slight JVD and legs don't appear terrible swollen but pit significantly bilaterally. GI: Soft, NT, ND, +BS  Neuro: Alert and oriented. No new focal deficits. Ext: Warm, no deformities Skin: No rashes, lesions or ulcers on visualized skin   Assessment & Plan: Principal Problem:   CHF exacerbation (HCC) Active Problems:   Diabetic neuropathy, type II diabetes mellitus (HCC)   Type 2 diabetes mellitus with peripheral neuropathy (HCC)   Essential hypertension   Depression   Hyperlipidemia   Gastroparesis   Gastroesophageal reflux disease without esophagitis   Acute renal failure superimposed on  stage 4 chronic kidney disease (HCC)   Coronary artery disease   BPH (benign prostatic hyperplasia)   Anemia in chronic kidney disease (CKD)  Acute on chronic HFpEF: Cardiology note 10/27/2021 recorded weight 91.4kg and said to be euvolemic at that time. He's 98.4kg here. - Repeat echo shows no significant change from the prior study. Preserved LVEF, indeterminate diastolic function. ?if he got slightly overloaded enough to impair GI absorption then worsened.  - Continue lasix '80mg'$  IV BID. Volume status improving on exam, -2.7L yesterday, already 1.3L this AM, weight down 98.4 > 95.2kg. Remains volume up though. Cr stable. During last admission required cardiology and nephrology consults. Pending his response, may need their involvement.  - Monitor I/O, daily weights - Continue metoprolol.  Mild AKI on stage IV CKD: Cr still below discharge value from last CHF admission of 4.1. Remains stable. at 3.9.  - Monitor UOP.  - No ACE/ARB.  Acute hypoxic respiratory failure: Due to acute CHF. Resolved as of today.  Acute urinary retention, BPH s/p TURP 10/2 by Dr. Abner Jenkins: No hydronephrosis on U/S. There is cystitis/bladder inflammation consistent with dysuria.   - Continue finasteride, tamsulosin - Abx as below  UTI: Based on pt report of dysuria, increased straining, and pyuria on UA.  - GNRs on UCx preliminary, continue CTX as he's hemodynamically stable, though still having symptoms and has hx ESBL so may need to change coverage.   Anemia of CKD: Hgb slightly below baseline, stable on recheck this AM. - Transfuse if bleeding develops.  T2DM: HbA1c 8.4%. - Continue insulins  as ordered - Continue nortriptyline for peripheral neuropathy. Add robaxin prn for muscle spasms  QT interval prolongation: Stable on recheck of ECG this AM >451mec.  - Discussed concern for Torsades with patient who elects NOT to take antiemetics or other provocative medications.  - Continue telemetry monitoring. Supp K  and Mg as above.    CAD, HTN, HLD: Nuc stress test May 2023 low risk study with normal LVSF, wall motion, no perfusion defects. [place] revealed 60% RCA lesion. - Continue metoprolol, plavix, statin, norvasc and hydralazine  GERD:  - Continue home meds, dose reduce pepcid due to CrCl < 368mmin.   Hypokalemia: Supplemented x40 and stable at 3.6, continuing loop diuretic, prolonged QT - Monitor, repeat 4022mtoday.  Gait instability: baseline gait impairment, worsened acutely.  - PT/OT   RyaPatrecia PourD [facility] [email or web address] 11/27/2021, 4:11 PM

## 2021-11-27 NOTE — Progress Notes (Signed)
Mobility Specialist - Progress Note   11/27/21 1201  Mobility  Activity Transferred from chair to bed  Level of Assistance Minimal assist, patient does 75% or more  Assistive Device Front wheel walker  Activity Response Tolerated well  Mobility Referral Yes  $Mobility charge 1 Mobility   Pt received in chair requesting to get back to bed. Pt was agreeable to mobility before getting back in bed. Pt was MinA throughout ambulation. Pt was returned to bed with all needs met.   Franki Monte  Mobility Specialist Please contact via Solicitor or Rehab office at [phone]

## 2021-11-28 LAB — URINE CULTURE: Culture: >=100000 — AB

## 2021-11-28 LAB — GLUCOSE, CAPILLARY
Glucose-Capillary: 128 mg/dL — ABNORMAL HIGH (ref 70–99)
Glucose-Capillary: 102 mg/dL — ABNORMAL HIGH (ref 70–99)
Glucose-Capillary: 140 mg/dL — ABNORMAL HIGH (ref 70–99)
Glucose-Capillary: 171 mg/dL — ABNORMAL HIGH (ref 70–99)

## 2021-11-28 LAB — BASIC METABOLIC PANEL
Anion gap: 9 (ref 5–15)
BUN: 39 mg/dL — ABNORMAL HIGH (ref 8–23)
CO2: 21 mmol/L — ABNORMAL LOW (ref 22–32)
Calcium: 8.6 mg/dL — ABNORMAL LOW (ref 8.9–10.3)
Chloride: 110 mmol/L (ref 98–111)
Creatinine, Ser: 3.85 mg/dL — ABNORMAL HIGH (ref 0.61–1.24)
GFR, Estimated: 17 mL/min — ABNORMAL LOW (ref >60)
Glucose, Bld: 133 mg/dL — ABNORMAL HIGH (ref 70–99)
Potassium: 3.9 mmol/L (ref 3.5–5.1)
Sodium: 140 mmol/L (ref 135–145)

## 2021-11-28 MED ORDER — POTASSIUM CHLORIDE CRYS ER 10 MEQ PO TBCR
10.0000 meq | EXTENDED_RELEASE_TABLET | Freq: Once | ORAL | Status: AC
  Administered 2021-11-28: 10 meq via ORAL
  Filled 2021-11-28: qty 1

## 2021-11-28 MED ORDER — FOSFOMYCIN TROMETHAMINE 3 G PO PACK
3.0000 g | PACK | Freq: Once | ORAL | Status: AC
  Administered 2021-11-28: 3 g via ORAL
  Filled 2021-11-28: qty 3

## 2021-11-28 NOTE — Progress Notes (Addendum)
[facility] PROGRESS NOTE  Ryan Jenkins (DOB: [birth year]) QIO:[id number] PCP: [name], MD Outpatient Specialists: Urology, [name]; Cardiology, [name]  Brief Narrative: Ryan Jenkins is a 61 y.o. male with a history of T2DM with gastroparesis and neuropathy, CAD, chronic HFpEF, HTN, stage IV CKD, BPH s/p TURP 10/16/21 who presented to the ED on 11/25/2021 for the second time in 2 days with worsening shortness of breath and abrupt unintentional weight gain of 15 pounds over the previous 3 days with decreased urine output despite taking lasix. Vital signs were stable, though he appeared volume overloaded with mild elevation in creatinine at 3.79 compared to baseline 3-3.5, hypokalemia (K 3.1), with stable anemia (Hgb 8.7 g/dl). CXR showed no acute abnormality. Lasix and potassium administered and he was admitted for acute on chronic HFpEF. Diuresis has been effective with stable creatinine. He is approaching EDW and likely to be stable for discharge in 24-48 hours.  Subjective: Walked with therapy yesterday, felt short of breath more than baseline but feels overall much improved. Swelling in legs is going down, he continues to state they are swollen from their usual. Some discomfort with urination that is nearly unchanged. Some left lower back spasms are mild-moderate. Also having chronic nausea he'll take ginger ale for, doesn't want risk of Torsades with QT prolonging agents.  Objective: BP (!) 172/87   Pulse 86   Temp 98.1 F (36.7 C) (Oral)   Resp 20   Ht '6\' 4"'$  (1.93 m)   Wt 93.4 kg   SpO2 96%   BMI 25.08 kg/m   Gen: Tall, pleasant male in no distress Pulm: Clear, nonlabored on room air  CV: RRR with no MRG. Again, legs appear normal but do still have some pitting when pressed. Improving overall however. GI: Soft, NT, ND, +BS  Neuro: Alert and oriented. No new focal deficits. Ext: Warm, no deformities Skin: No rashes, lesions or ulcers on visualized skin    Assessment & Plan: Principal Problem:   CHF exacerbation (HCC) Active Problems:   Diabetic neuropathy, type II diabetes mellitus (HCC)   Type 2 diabetes mellitus with peripheral neuropathy (HCC)   Essential hypertension   Depression   Hyperlipidemia   Gastroparesis   Gastroesophageal reflux disease without esophagitis   Acute renal failure superimposed on stage 4 chronic kidney disease (HCC)   Coronary artery disease   BPH (benign prostatic hyperplasia)   Anemia in chronic kidney disease (CKD)  Acute on chronic HFpEF: Cardiology note 10/27/2021 recorded weight 91.4kg and said to be euvolemic at that time. He's 98.4kg here. - Repeat echo shows no significant change from the prior study. Preserved LVEF, indeterminate diastolic function.  - Continue lasix '80mg'$  IV BID. Volume status continues improving, good UOP, stable creatinine, weight down 98.4 > 93.4kg.  - Remains volume up, though close to euvolemic. Anticipate transition to po diuretic in 24 hours. ?if he got slightly overloaded enough to impair GI absorption then worsened. Could consider transition to torsemide at discharge. - Monitor I/O, daily weights - Continue metoprolol.  Mild AKI on stage IV CKD: Cr still below discharge value from last CHF admission of 4.1. Remains stable. at 3.8-3.9 - Monitor UOP.  - No ACE/ARB.  Acute hypoxic respiratory failure: Due to acute CHF. Resolved as of today.  Acute urinary retention, BPH s/p TURP 10/2 by [name]: No hydronephrosis on U/S. There is cystitis/bladder inflammation consistent with dysuria.   - Continue finasteride, tamsulosin - Abx as below  UTI: Based on  pt report of dysuria, increased straining, and pyuria on UA. Also bladder wall inflammation by CT. - Hx ESBL and indeed has ESBL Klebsiella on Cx, though only 10k, also w/>100k E. faecalis, amp-sensitive. Will d/w ID appropriate, preferably oral, options for this. DC CTX. Update, d/w Dr. Candiss Norse. Will give fosfomycin  x1.  Anemia of CKD: Hgb slightly below baseline, stable on recheck this AM. - Transfuse if bleeding develops.  T2DM: HbA1c 8.4%. - Continue insulins as ordered - Continue nortriptyline for peripheral neuropathy. Added robaxin prn for muscle spasms  QT interval prolongation: Stable on recheck of ECG this AM >468mec.  - Discussed concern for Torsades with patient who elects NOT to take antiemetics or other provocative medications.  - Continue telemetry monitoring. Supp K and Mg as above.    CAD, HTN, HLD: Nuc stress test May 2023 low risk study with normal LVSF, wall motion, no perfusion defects. [place]2021 revealed 60% RCA lesion. - Continue metoprolol, plavix, statin, norvasc and hydralazine  GERD:  - Continue home meds, dose reduce pepcid due to CrCl < 353mmin.   Hypokalemia: Improved, continuing loop diuretic, has prolonged QT - Monitor in AM. Give 10 mEq  Gait instability: baseline gait impairment, worsened acutely.  - PT/OT   RyPatrecia PourMD [facility] [email or web address] 11/28/2021, 12:09 PM

## 2021-11-28 NOTE — Progress Notes (Signed)
Heart Failure Nurse Navigator Progress Note    Patient interviewed for HF TOC. Education done on the sign and symptoms of heart failure, daily weights, diet/ fluid restrictions, taking all medications as prescribed and attending all medical appointments. Patient reported that he sees Dr. Gabriel Cirri with [facility] and would like to stay with only him.   Per patient decline of HF TOC, Navigator will sign off.   Earnestine Leys, BSN, Clinical cytogeneticist Only

## 2021-11-29 ENCOUNTER — Other Ambulatory Visit (HOSPITAL_COMMUNITY): Payer: Self-pay

## 2021-11-29 DIAGNOSIS — I509 Heart failure, unspecified: Secondary | ICD-10-CM

## 2021-11-29 LAB — BASIC METABOLIC PANEL
Anion gap: 9 (ref 5–15)
BUN: 39 mg/dL — ABNORMAL HIGH (ref 8–23)
CO2: 22 mmol/L (ref 22–32)
Calcium: 8.5 mg/dL — ABNORMAL LOW (ref 8.9–10.3)
Chloride: 107 mmol/L (ref 98–111)
Creatinine, Ser: 4.04 mg/dL — ABNORMAL HIGH (ref 0.61–1.24)
GFR, Estimated: 16 mL/min — ABNORMAL LOW (ref >60)
Glucose, Bld: 127 mg/dL — ABNORMAL HIGH (ref 70–99)
Potassium: 3.6 mmol/L (ref 3.5–5.1)
Sodium: 138 mmol/L (ref 135–145)

## 2021-11-29 LAB — GLUCOSE, CAPILLARY
Glucose-Capillary: 105 mg/dL — ABNORMAL HIGH (ref 70–99)
Glucose-Capillary: 112 mg/dL — ABNORMAL HIGH (ref 70–99)
Glucose-Capillary: 142 mg/dL — ABNORMAL HIGH (ref 70–99)

## 2021-11-29 MED ORDER — GABAPENTIN 300 MG PO CAPS
300.0000 mg | ORAL_CAPSULE | Freq: Every day | ORAL | Status: DC
  Administered 2021-11-29: 300 mg via ORAL
  Filled 2021-11-29: qty 1

## 2021-11-29 MED ORDER — LANTUS SOLOSTAR 100 UNIT/ML ~~LOC~~ SOPN: 22.0000 [IU] | PEN_INJECTOR | Freq: Every day | SUBCUTANEOUS | 2 refills | Status: DC

## 2021-11-29 MED ORDER — FUROSEMIDE 40 MG PO TABS: 60.0000 mg | ORAL_TABLET | Freq: Two times a day (BID) | ORAL | 3 refills | Status: DC

## 2021-11-29 MED ORDER — GABAPENTIN 300 MG PO CAPS: 300.0000 mg | ORAL_CAPSULE | Freq: Every day | ORAL | 0 refills | Status: DC | PRN

## 2021-11-29 NOTE — Progress Notes (Signed)
Occupational Therapy Treatment Patient Details Name: Ryan Jenkins MRN: [id number] DOB: Jan 14, 1961 Today's Date: 11/29/2021   History of present illness Pt is a 61 y.o. male who presented 11/24/21 with worsening shortness of breath and weight gain. Pt admitted with acute on chronic HFpEF, potential UTI, and mild AKI on stage IV CKD. PMH includes: diabetes with neuropathy and gastroparesis, HTN, CKD 4, depression, anxiety, HLD, GERD, CHF, CAD, anemia   OT comments  Pt with slow progression towards goals, able to complete grooming task at sink with set up A, pt initially started standing x1 min, however states needing to sit due to BLE neuropathy pain. Pt needing increased time to complete task, able to complete bed mobility with supervision and transfers with min guard A using RW. Pt SpO2 94% on RA. Pt presenting with impairments listed below, will follow acutely. Continue to recommend HHOT at d/c.   Recommendations for follow up therapy are one component of a multi-disciplinary discharge planning process, led by the attending physician.  Recommendations may be updated based on patient status, additional functional criteria and insurance authorization.    Follow Up Recommendations  Home health OT     Assistance Recommended at Discharge Intermittent Supervision/Assistance  Patient can return home with the following  A little help with bathing/dressing/bathroom;A little help with walking and/or transfers;Direct supervision/assist for medications management;Direct supervision/assist for financial management;Assist for transportation;Help with stairs or ramp for entrance   Equipment Recommendations  None recommended by OT;Other (comment) (pt has all needed DME)    Recommendations for Other Services PT consult    Precautions / Restrictions Precautions Precautions: Fall Restrictions Weight Bearing Restrictions: No       Mobility Bed Mobility Overal bed mobility: Needs Assistance Bed  Mobility: Supine to Sit     Supine to sit: Supervision          Transfers Overall transfer level: Needs assistance Equipment used: Rolling walker (2 wheels) Transfers: Sit to/from Stand Sit to Stand: Min guard                 Balance Overall balance assessment: Needs assistance Sitting-balance support: Feet supported Sitting balance-Leahy Scale: Good     Standing balance support: Bilateral upper extremity supported Standing balance-Leahy Scale: Poor Standing balance comment: reliant on external support                           ADL either performed or assessed with clinical judgement   ADL Overall ADL's : Needs assistance/impaired     Grooming: Set up;Sitting                   Toilet Transfer: Min guard;Rolling walker (2 wheels);Ambulation;Regular Glass blower/designer Details (indicate cue type and reason): simulated via functional mobility         Functional mobility during ADLs: Min guard;Rolling walker (2 wheels)      Extremity/Trunk Assessment Upper Extremity Assessment Upper Extremity Assessment: Generalized weakness (hx of peripheral neuropathy)   Lower Extremity Assessment Lower Extremity Assessment: Defer to PT evaluation        Vision   Vision Assessment?: No apparent visual deficits   Perception     Praxis      Cognition Arousal/Alertness: Awake/alert Behavior During Therapy: Flat affect Overall Cognitive Status: Within Functional Limits for tasks assessed  General Comments: Pt aware of his deficits and safety concerns. Flat affect during, appropriate considering his report of pain.        Exercises      Shoulder Instructions       General Comments SpO2 94% on RA during sinkside ADL    Pertinent Vitals/ Pain       Pain Assessment Pain Assessment: No/denies pain Pain Score: 6  Faces Pain Scale: Hurts even more Pain Location: legs, neuropathy Pain  Descriptors / Indicators: Discomfort Pain Intervention(s): Limited activity within patient's tolerance, Monitored during session, Repositioned  Home Living                                          Prior Functioning/Environment              Frequency  Min 2X/week        Progress Toward Goals  OT Goals(current goals can now be found in the care plan section)  Progress towards OT goals: Progressing toward goals  Acute Rehab OT Goals Patient Stated Goal: none stated OT Goal Formulation: With patient Time For Goal Achievement: 12/11/21 Potential to Achieve Goals: Good ADL Goals Pt Will Perform Upper Body Dressing: standing;Independently Pt Will Perform Lower Body Dressing: Independently;sitting/lateral leans;sit to/from stand Pt Will Transfer to Toilet: Independently;ambulating;regular height toilet Pt Will Perform Tub/Shower Transfer: Tub transfer;Shower transfer;Independently;ambulating  Plan Discharge plan remains appropriate;Frequency remains appropriate    Co-evaluation                 AM-PAC OT "6 Clicks" Daily Activity     Outcome Measure   Help from another person eating meals?: None Help from another person taking care of personal grooming?: A Little Help from another person toileting, which includes using toliet, bedpan, or urinal?: A Little Help from another person bathing (including washing, rinsing, drying)?: A Little Help from another person to put on and taking off regular upper body clothing?: A Little Help from another person to put on and taking off regular lower body clothing?: A Little 6 Click Score: 19    End of Session Equipment Utilized During Treatment: Gait belt;Rolling walker (2 wheels)  OT Visit Diagnosis: Unsteadiness on feet (R26.81);Other abnormalities of gait and mobility (R26.89);Muscle weakness (generalized) (M62.81)   Activity Tolerance Patient tolerated treatment well   Patient Left with call bell/phone  within reach;in chair   Nurse Communication Mobility status        Time: 4166-0630 OT Time Calculation (min): 29 min  Charges: OT General Charges $OT Visit: 1 Visit OT Treatments $Self Care/Home Management : 8-22 mins $Therapeutic Activity: 8-22 mins  Ryan Jenkins, OTD, OTR/L Acute Rehab [phone]   Ryan Jenkins 11/29/2021, 8:46 AM

## 2021-11-29 NOTE — TOC Initial Note (Signed)
Transition of Care [facility]) - Initial/Assessment Note    Patient Details  Name: SAMMIE DENNER MRN: [id number] Date of Birth: [birth year]  [facility]) CM/SW Contact:    Bethena Roys, RN Phone Number: 11/29/2021, 5:05 PM  Clinical Narrative: Risk for readmission assessment completed. Case Manager spoke with patient regarding home health services. Case Manager called all agencies on Medicare.gov list and [facility] was the only agency willing to accept the patient. Start of care to begin on Monday 12-04-21- MD is aware and agreeable. Patient is aware and agreeable as well. No further needs identified at this time.                  Expected Discharge Plan: [place] Barriers to Discharge: No Barriers Identified   Patient Goals and CMS Choice Patient states their goals for this hospitalization and ongoing recovery are:: Patient wants to return home. CMS Medicare.gov Compare Post Acute Care list provided to:: Patient Choice offered to / list presented to : Patient  Expected Discharge Plan and Services Expected Discharge Plan: [place] In-house Referral: NA Discharge Planning Services: CM Consult Post Acute Care Choice: [place] arrangements for the past 2 months: Apartment Expected Discharge Date: 11/29/21               DME Arranged: N/A DME Agency: NA       HH Arranged: PT HH Agency: [place] Date [place]: 11/29/21 Time HH Agency Contacted: 33 Representative spoke with at [place]: [place] Arrangements/Services Living arrangements for the past 2 months: [place] with:: Siblings Patient language and need for interpreter reviewed:: Yes Do you feel safe going back to the place where you live?: Yes      Need for Family Participation in Patient Care: No (Comment) Care giver support system in place?: No (comment) Current home services: DME (Patient has  a rolling walker at home.) Criminal Activity/Legal Involvement Pertinent to Current Situation/Hospitalization: No - Comment as needed  Activities of Daily Living Home Assistive Devices/Equipment: Walker (specify type) ADL Screening (condition at time of admission) Patient's cognitive ability adequate to safely complete daily activities?: Yes Is the patient deaf or have difficulty hearing?: No Does the patient have difficulty seeing, even when wearing glasses/contacts?: No Does the patient have difficulty concentrating, remembering, or making decisions?: No Patient able to express need for assistance with ADLs?: Yes Does the patient have difficulty dressing or bathing?: No Independently performs ADLs?: Yes (appropriate for developmental age) Does the patient have difficulty walking or climbing stairs?: No Weakness of Legs: None Weakness of Arms/Hands: None  Permission Sought/Granted Permission sought to share information with : Family Supports, Customer service manager, Case Manager   Emotional Assessment Appearance:: Appears stated age Attitude/Demeanor/Rapport: Engaged Affect (typically observed): Appropriate Orientation: : Oriented to Situation, Oriented to  Time, Oriented to Place, Oriented to Self Alcohol / Substance Use: Not Applicable Psych Involvement: No (comment)  Admission diagnosis:  CHF exacerbation ([place]) [I50.9] Patient Active Problem List   Diagnosis Date Noted   CHF exacerbation ([place]) 10/19/2021   Hypokalemia 10/18/2021   Anemia in chronic kidney disease (CKD) 10/18/2021   BPH (benign prostatic hyperplasia) 10/16/2021   Severe sepsis ([place]) 06/05/2021   Coronary artery disease 02/07/2021   Acute renal failure superimposed on stage 4 chronic kidney disease (HCC)    CKD (chronic kidney disease) stage 4, GFR 15-29 ml/min ([place]) 07/07/2020   Acute on chronic diastolic  CHF (congestive heart failure) ([place]) 07/07/2020   Erectile dysfunction associated with type 2  diabetes mellitus ([place]) 08/07/2019   Vitreous hemorrhage of left eye ([place]) 07/22/2019   Vision loss of left eye 07/22/2019   History of medication noncompliance 04/02/2019   Gastric ulcer without hemorrhage or perforation 12/25/2018   Gastroesophageal reflux disease without esophagitis 09/04/2018   Diabetic retinopathy of both eyes associated with type 2 diabetes mellitus ([place]) 01/03/2018   Intermittent diarrhea 09/27/2017   Gastroparesis 09/27/2017   Macroalbuminuric diabetic nephropathy ([place]) 06/25/2017   History of falling 06/25/2017   Hyperlipidemia 03/21/2017   Vitamin B 12 deficiency 03/21/2017   Type 2 diabetes mellitus with peripheral neuropathy ([place]) 02/05/2017   Essential hypertension 02/05/2017   Depression 02/05/2017   Unintended weight loss 02/05/2017   Gait disturbance 02/05/2017   Pronation deformity of both feet 05/11/2014   Diabetic neuropathy, type II diabetes mellitus ([place]) 05/11/2014   Metatarsal deformity 05/11/2014   PCP:  [name], MD Pharmacy:   [facility] [place], [place] - [address] AT [address] [place] [address] Phone: [phone] Fax: [phone]  [id number] [place] [address]  [id number]: [phone]: [phone]  Readmission Risk Interventions    11/29/2021    4:16 PM  Readmission Risk Prevention Plan  Transportation Screening Complete  Medication Review (RN Care Manager) Complete  [place] or [place]  SW Recovery Care/Counseling Consult Complete  Palliative Care Screening Not ATurnerNot Applicable

## 2021-11-29 NOTE — Progress Notes (Signed)
Patient discharging home. Vital signs stable at time of discharge as reflected in discharge summary. Discharge instructions given and verbal understanding returned. No questions at this time. 

## 2021-11-29 NOTE — Progress Notes (Signed)
Patient reporting pain shooting down his legs. He refused tylenol because it does not help. Notified MD.

## 2021-11-29 NOTE — Discharge Summary (Signed)
Physician Discharge Summary   Patient: Ryan Jenkins MRN: [id number] DOB: [birth year]  Admit date:     11/25/2021  Discharge date: 11/29/21  Discharge Physician: Barton Dubois   PCP: [name], MD   Recommendations at discharge:  Repeat basic metabolic panel to follow electrolytes renal function Reassess blood pressure and further adjust antihypertensive regimen as needed Repeat CBC to follow hemoglobin trend/stability. Reassess blood pressure and further adjust antihypertensive regimen as needed. Continue close monitoring of patient's CBGs/A1c with further adjustment to hypoglycemic regimen as required.  Discharge Diagnoses: Principal Problem:   CHF exacerbation ([place]) Active Problems:   Diabetic neuropathy, type II diabetes mellitus ([place])   Type 2 diabetes mellitus with peripheral neuropathy ([place])   Essential hypertension   Depression   Hyperlipidemia   Gastroparesis   Gastroesophageal reflux disease without esophagitis   Acute renal failure superimposed on stage 4 chronic kidney disease (HCC)   Coronary artery disease   BPH (benign prostatic hyperplasia)   Anemia in chronic kidney disease (CKD)  Hospital Course: Ryan Jenkins is a 61 y.o. male with a history of T2DM with gastroparesis and neuropathy, CAD, chronic HFpEF, HTN, stage IV CKD, BPH s/p TURP 10/16/21 who presented to the ED on 11/25/2021 for the second time in 2 days with worsening shortness of breath and abrupt unintentional weight gain of 15 pounds over the previous 3 days with decreased urine output despite taking lasix. Vital signs were stable, though he appeared volume overloaded with mild elevation in creatinine at 3.79 compared to baseline 3-3.5, hypokalemia (K 3.1), with stable anemia (Hgb 8.7 g/dl). CXR showed no acute abnormality. Lasix and potassium administered and he was admitted for acute on chronic HFpEF. Diuresis has been effective with stable creatinine. He is approaching EDW and likely to be  stable for discharge in 24-48 hours.   Assessment and Plan: Acute on chronic HFpEF:  -Repeat echo shows no significant change from the prior study. Preserved LVEF, indeterminate diastolic function.  -Excellent response to IV diuresis -No orthopnea, no shortness of breath, no edema appreciated. -Diuretics dosage has been adjusted and patient discharged home with instruction to follow-up with PCP and cardiology service.   -Continue metoprolol.   Mild AKI on stage IV CKD:  -In the setting of decreased perfusion from CHF exacerbation -Patient advised to maintain adequate hydration -Continue to minimize nephrotoxic agents -Diuretics dosage has been adjusted -Outpatient follow-up with nephrology recommended.     Acute hypoxic respiratory failure: Due to acute CHF.  -Resolved   Acute urinary retention, BPH s/p TURP 10/2 by [name]: No hydronephrosis on U/S. There is cystitis/bladder inflammation consistent with dysuria.   - Continue finasteride and tamsulosin -Outpatient follow-up with urology service recommended.   UTI: Based on pt report of dysuria, increased straining, and pyuria on UA. Also bladder wall inflammation by CT. - Hx ESBL and indeed has ESBL Klebsiella on Cx, though only 10k, also w/>100k E. faecalis, amp-sensitive.  -Treatment discussed with infectious disease and fosfomycin management provided. (Dr. Candiss Norse).   Anemia of CKD:  -Hemoglobin is stable and at his baseline. -No overt bleeding appreciated -Continue to follow hemoglobin trend.     T2DM: HbA1c 8.4%. -Resume home hypoglycemic regimen. - Continue nortriptyline for peripheral neuropathy.  -Daily as needed Neurontin provided at discharge.   QT interval prolongation: Stable on recheck of ECG this AM >471mec.  - Discussed concern for [place] with patient who elects NOT to take antiemetics or other provocative medications.  -Maintain adequate supplementation of potassium  and magnesium.   CAD, HTN, HLD: Nuc stress  test May 2023 low risk study with normal LVSF, wall motion, no perfusion defects. [place] 2021 revealed 60% RCA lesion. - Continue metoprolol, plavix, statin, norvasc and hydralazine -Continue outpatient follow-up with cardiology service.   GERD:  - Continue home meds -Lifestyle modifications discussed with patient.   Hypokalemia:  -Improved/corrected with repletion -Resume home supplementation.   Gait instability: baseline gait impairment, worsened acutely.  -Appreciate physical therapy evaluation -Home health PT arranged at discharge.   Consultants: None Procedures performed: See below for x-ray reports. Disposition: Home Diet recommendation: Heart healthy modified carbohydrate diet.  DISCHARGE MEDICATION: Allergies as of 11/29/2021       Reactions   Claritin [loratadine] Swelling   Joint swelling   Hydrochlorothiazide Other (See Comments)   Dizziness   Latex Hives   Lyrica [pregabalin] Other (See Comments)   depression   Metformin And Related Other (See Comments)   GI        Medication List     STOP taking these medications    cephALEXin 500 MG capsule Commonly known as: KEFLEX   D-Mannose 350 MG Caps   rosuvastatin 10 MG tablet Commonly known as: CRESTOR       TAKE these medications    acetaminophen 500 MG tablet Commonly known as: TYLENOL Take 1,000 mg by mouth every 6 (six) hours as needed for mild pain or headache.   amLODipine 10 MG tablet Commonly known as: NORVASC Take 1 tablet (10 mg total) by mouth daily.   atorvastatin 40 MG tablet Commonly known as: LIPITOR Take 1 tablet (40 mg total) by mouth daily.   clopidogrel 75 MG tablet Commonly known as: PLAVIX Take 1 tablet (75 mg total) by mouth daily.   clotrimazole 1 % cream Commonly known as: LOTRIMIN Apply 1 Application topically 2 (two) times daily as needed (rash).   docusate sodium 100 MG capsule Commonly known as: Colace Take 1 capsule (100 mg total) by mouth daily as needed  for up to 30 doses.   escitalopram 20 MG tablet Commonly known as: LEXAPRO Take 1 tablet (20 mg total) by mouth daily.   famotidine 40 MG tablet Commonly known as: PEPCID Take 40 mg by mouth daily.   finasteride 5 MG tablet Commonly known as: PROSCAR Take 1 tablet (5 mg total) by mouth daily.   furosemide 40 MG tablet Commonly known as: Lasix Take 1.5 tablets (60 mg total) by mouth 2 (two) times daily. What changed: how much to take   gabapentin 300 MG capsule Commonly known as: NEURONTIN Take 1 capsule (300 mg total) by mouth daily as needed (neuropathic pain).   hydrALAZINE 100 MG tablet Commonly known as: APRESOLINE Take 1 tablet (100 mg total) by mouth 3 (three) times daily.   Lantus SoloStar 100 UNIT/ML Solostar Pen Generic drug: insulin glargine Inject 22 Units into the skin at bedtime. What changed: how much to take   meclizine 25 MG tablet Commonly known as: ANTIVERT Take 25 mg by mouth 3 (three) times daily as needed for dizziness.   metoprolol succinate 50 MG 24 hr tablet Commonly known as: TOPROL-XL Take 1 tablet (50 mg total) by mouth at bedtime. Take with or immediately following a meal. What changed: when to take this   nortriptyline 25 MG capsule Commonly known as: PAMELOR Take 1 capsule (25 mg total) by mouth at bedtime.   oxyCODONE-acetaminophen 5-325 MG tablet Commonly known as: Percocet Take 1 tablet by mouth every 4 (four)  hours as needed for up to 18 doses for severe pain.   pantoprazole 40 MG tablet Commonly known as: PROTONIX Take 1 tablet (40 mg total) by mouth daily.   potassium chloride 10 MEQ tablet Commonly known as: KLOR-CON M Take 1 tablet (10 mEq total) by mouth daily.   sodium bicarbonate 650 MG tablet Take 650 mg by mouth 2 (two) times daily.   tamsulosin 0.4 MG Caps capsule Commonly known as: FLOMAX Take 0.4 mg by mouth at bedtime.   triamcinolone ointment 0.5 % Commonly known as: KENALOG Apply 1 Application topically  2 (two) times daily as needed (rash).   Vitamin D3 1.25 MG (50000 UT) Caps Take 50,000 Units by mouth once a week.        Discharge Exam: Filed Weights   11/27/21 0506 11/28/21 0554 11/29/21 0500  Weight: 95.2 kg 93.4 kg 93.6 kg   General exam: Alert, awake, oriented x 3; in no acute distress.  Patient feels ready to go home.  No shortness of breath, no orthopnea, good saturation on room air. Respiratory system: Clear to auscultation. Respiratory effort normal.  No wheezing or crackles. Cardiovascular system:RRR. No rubs Gastrointestinal system: Abdomen is nondistended, soft and nontender. No organomegaly or masses felt. Normal bowel sounds heard. Central nervous system: Alert and oriented. No focal neurological deficits. Extremities: No cyanosis, clubbing or edema. Skin: No petechiae. Psychiatry: Judgement and insight appear normal. Mood & affect appropriate.    Condition at discharge: Stable and improved.  The results of significant diagnostics from this hospitalization (including imaging, microbiology, ancillary and laboratory) are listed below for reference.   Imaging Studies: US RENAL  Result Date: 11/26/2021 CLINICAL DATA:  AKI EXAM: RENAL / URINARY TRACT ULTRASOUND COMPLETE COMPARISON:  CT renal stone protocol October 31, 2021, renal ultrasound October 20, 2021 FINDINGS: Right Kidney: Renal measurements: 10.0 x 5.3 x 5.0 cm = volume: 133.3 mL. Slightly increased renal cortical echogenicity. No mass or hydronephrosis visualized. Left Kidney: Renal measurements: 11.4 x 6.1 x 6.4 cm = volume: 231.3 mL. Slightly increased renal cortical echogenicity. No mass or hydronephrosis visualized. Bladder: Mild circumferential bladder wall thickening. Other: None. IMPRESSION: 1. Increased renal cortical echogenicity, which can be seen in the setting of medical renal disease. No hydronephrosis. 2. Mild circumferential bladder wall thickening, favored to be secondary to cystitis given positive  urinalysis results. Electronically Signed   By: Beryle Flock M.D.   On: 11/26/2021 14:28   ECHOCARDIOGRAM COMPLETE  Result Date: 11/26/2021    ECHOCARDIOGRAM REPORT   Patient Name:   AYSEN SHIEH Date of Exam: 11/26/2021 Medical Rec #:  [id number]         Height:       76.0 in Accession #:    [id number]        Weight:       216.7 lb Date of Birth:  09/29/1960         BSA:          2.292 m Patient Age:    31 years          BP:           168/82 mmHg Patient Gender: M                 HR:           93 bpm. Exam Location:  Inpatient Procedure: 2D Echo, Color Doppler and Cardiac Doppler Indications:    W29.56 Acute diastolic (congestive) heart failure  History:  Patient has prior history of Echocardiogram examinations, most                 recent 06/05/2021. CHF, CAD; Risk Factors:Hypertension, Diabetes                 and Dyslipidemia.  Sonographer:    Raquel Sarna Senior RDCS Referring Phys: [id number] [place]  1. Left ventricular ejection fraction, by estimation, is 55 to 60%. The left ventricle has normal function. Left ventricular endocardial border not optimally defined to evaluate regional wall motion. There is mild left ventricular hypertrophy. Indeterminate diastolic filling due to E-A fusion.  2. Right ventricular systolic function was not well visualized. The right ventricular size is not well visualized.  3. Left atrial size was mildly dilated.  4. The mitral valve is grossly normal. Trivial mitral valve regurgitation. No evidence of mitral stenosis.  5. The aortic valve is tricuspid. There is moderate calcification of the aortic valve. Aortic valve regurgitation is not visualized. No aortic stenosis is present.  6. The inferior vena cava is dilated in size with <50% respiratory variability, suggesting right atrial pressure of 15 mmHg. Comparison(s): No significant change from prior study. FINDINGS  Left Ventricle: Left ventricular ejection fraction, by estimation, is 55 to 60%.  The left ventricle has normal function. Left ventricular endocardial border not optimally defined to evaluate regional wall motion. The left ventricular internal cavity size was normal in size. There is mild left ventricular hypertrophy. Indeterminate diastolic filling due to E-A fusion. Right Ventricle: The right ventricular size is not well visualized. Right vetricular wall thickness was not well visualized. Right ventricular systolic function was not well visualized. Left Atrium: Left atrial size was mildly dilated. Right Atrium: Right atrial size was normal in size. Pericardium: Trivial pericardial effusion is present. Mitral Valve: The mitral valve is grossly normal. Trivial mitral valve regurgitation. No evidence of mitral valve stenosis. Tricuspid Valve: The tricuspid valve is grossly normal. Tricuspid valve regurgitation is trivial. No evidence of tricuspid stenosis. Aortic Valve: The aortic valve is tricuspid. There is moderate calcification of the aortic valve. Aortic valve regurgitation is not visualized. No aortic stenosis is present. Pulmonic Valve: The pulmonic valve was grossly normal. Pulmonic valve regurgitation is trivial. No evidence of pulmonic stenosis. Aorta: The aortic root and ascending aorta are structurally normal, with no evidence of dilitation. Venous: The inferior vena cava is dilated in size with less than 50% respiratory variability, suggesting right atrial pressure of 15 mmHg. IAS/Shunts: No atrial level shunt detected by color flow Doppler.  LEFT VENTRICLE PLAX 2D LVIDd:         4.40 cm      Diastology LVIDs:         3.10 cm      LV e' medial:    6.20 cm/s LV PW:         1.10 cm      LV E/e' medial:  19.4 LV IVS:        1.20 cm      LV e' lateral:   8.49 cm/s LVOT diam:     2.20 cm      LV E/e' lateral: 14.1 LV SV:         61 LV SV Index:   27 LVOT Area:     3.80 cm  LV Volumes (MOD) LV vol d, MOD A2C: 90.7 ml LV vol d, MOD A4C: 105.0 ml LV vol s, MOD A2C: 37.7 ml LV vol s, MOD A4C:  45.2 ml LV SV MOD A2C:     53.0 ml LV SV MOD A4C:     105.0 ml LV SV MOD BP:      56.8 ml RIGHT VENTRICLE RV S prime:     12.00 cm/s TAPSE (M-mode): 2.1 cm LEFT ATRIUM             Index        RIGHT ATRIUM           Index LA diam:        4.90 cm 2.14 cm/m   RA Area:     19.20 cm LA Vol (A2C):   79.2 ml 34.55 ml/m  RA Volume:   53.80 ml  23.47 ml/m LA Vol (A4C):   75.8 ml 33.07 ml/m LA Biplane Vol: 79.1 ml 34.51 ml/m  AORTIC VALVE LVOT Vmax:   89.30 cm/s LVOT Vmean:  66.900 cm/s LVOT VTI:    0.161 m  AORTA Ao Root diam: 3.30 cm Ao Asc diam:  3.70 cm MITRAL VALVE MV Area (PHT): 3.45 cm     SHUNTS MV Decel Time: 220 msec     Systemic VTI:  0.16 m MV E velocity: 120.00 cm/s  Systemic Diam: 2.20 cm MV A velocity: 42.20 cm/s MV E/A ratio:  2.84 Vishnu Priya Mallipeddi Electronically signed by Lorelee Cover Mallipeddi Signature Date/Time: 11/26/2021/2:23:34 PM    Final    DG Chest 2 View  Result Date: 11/25/2021 CLINICAL DATA:  Short of breath, cough and congestion with mild chest pain for 4 days. EXAM: CHEST - 2 VIEW COMPARISON:  11/24/2021 and older exams. FINDINGS: Normal heart, mediastinum and hila. Clear lungs.  No pleural effusion or pneumothorax. Skeletal structures are unremarkable. IMPRESSION: No active cardiopulmonary disease. Electronically Signed   By: Lajean Manes M.D.   On: 11/25/2021 13:24   DG Chest 2 View  Result Date: 11/24/2021 CLINICAL DATA:  Shortness of breath. Chest tightness. Productive cough. EXAM: CHEST - 2 VIEW COMPARISON:  10/20/2021 FINDINGS: Stable enlarged cardiac silhouette. Decreased prominence of the interstitial markings and pulmonary vasculature. No airspace consolidation or pleural fluid. Unremarkable bones. IMPRESSION: No acute abnormality. Stable cardiomegaly. Electronically Signed   By: Claudie Revering M.D.   On: 11/24/2021 17:29   CT Renal Stone Study  Result Date: 10/31/2021 CLINICAL DATA:  Flank pain, kidney stone suspected. Urinary retention. EXAM: CT ABDOMEN  AND PELVIS WITHOUT CONTRAST TECHNIQUE: Multidetector CT imaging of the abdomen and pelvis was performed following the standard protocol without IV contrast. RADIATION DOSE REDUCTION: This exam was performed according to the departmental dose-optimization program which includes automated exposure control, adjustment of the mA and/or kV according to patient size and/or use of iterative reconstruction technique. COMPARISON:  07/06/2020 FINDINGS: Lower chest: No acute abnormality. Hepatobiliary: No focal liver abnormality is seen. No gallstones, gallbladder wall thickening, or biliary dilatation. Pancreas: Unremarkable Spleen: Unremarkable Adrenals/Urinary Tract: The adrenal glands are unremarkable. The bladder is circumferentially thick walled and there is mild perivesicular inflammatory stranding. There is mild bilateral perinephric inflammatory stranding. The findings are nonspecific but can be seen the setting of infectious pyelonephritis/cystitis. No hydronephrosis. No intrarenal or ureteral calculi. The kidneys are normal in size and position. The bladder is decompressed. Stomach/Bowel: Stomach is within normal limits. Appendix appears normal. No evidence of bowel wall thickening, distention, or inflammatory changes. Mild sigmoid diverticulosis noted. Vascular/Lymphatic: Moderate visceral arteriosclerosis. No aortic aneurysm. No pathologic adenopathy within the abdomen and pelvis. Reproductive: Defect within the central prostate gland may reflect changes of trans  urethral resection. Calcification of the vas deferens noted in keeping with longstanding changes of diabetes mellitus. Other: No abdominal wall hernia or abnormality. No abdominopelvic ascites. Musculoskeletal: Osseous structures are age-appropriate. No acute bone abnormality. No lytic or blastic bone lesion. IMPRESSION: 1. Circumferential bladder wall thickening with mild perivesicular inflammatory stranding and mild bilateral perinephric inflammatory  stranding. While nonspecific, this can be seen the setting of infectious pyelonephritis/cystitis. Correlation with urinalysis and urine culture may be helpful. No urolithiasis. No hydronephrosis. 2. Mild sigmoid diverticulosis. Electronically Signed   By: Fidela Salisbury M.D.   On: 10/31/2021 19:47    Microbiology: Results for orders placed or performed during the hospital encounter of 11/25/21  Urine Culture     Status: Abnormal   Collection Time: 11/26/21  1:37 PM   Specimen: Urine, Clean Catch  Result Value Ref Range Status   Specimen Description URINE, CLEAN CATCH  Final   Special Requests   Final    NONE Performed at [facility], [place] [address] [place], [place] [address]    Culture (A)  Final    >=100,000 COLONIES/mL ENTEROCOCCUS FAECALIS 10,000 COLONIES/mL KLEBSIELLA PNEUMONIAE Confirmed Extended Spectrum Beta-Lactamase Producer (ESBL).  In bloodstream infections from ESBL organisms, carbapenems are preferred over piperacillin/tazobactam. They are shown to have a lower risk of mortality.    Report Status 11/28/2021 FINAL  Final   Organism ID, Bacteria ENTEROCOCCUS FAECALIS (A)  Final   Organism ID, Bacteria KLEBSIELLA PNEUMONIAE (A)  Final      Susceptibility   Enterococcus faecalis - MIC*    AMPICILLIN <=2 SENSITIVE Sensitive     NITROFURANTOIN <=16 SENSITIVE Sensitive     VANCOMYCIN 1 SENSITIVE Sensitive     * >=100,000 COLONIES/mL ENTEROCOCCUS FAECALIS   Klebsiella pneumoniae - MIC*    AMPICILLIN >=32 RESISTANT Resistant     CEFAZOLIN >=64 RESISTANT Resistant     CEFEPIME 8 INTERMEDIATE Intermediate     CEFTRIAXONE >=64 RESISTANT Resistant     CIPROFLOXACIN >=4 RESISTANT Resistant     GENTAMICIN <=1 SENSITIVE Sensitive     IMIPENEM <=0.25 SENSITIVE Sensitive     NITROFURANTOIN 128 RESISTANT Resistant     TRIMETH/SULFA >=320 RESISTANT Resistant     AMPICILLIN/SULBACTAM 16 INTERMEDIATE Intermediate     PIP/TAZO <=4 SENSITIVE Sensitive     * 10,000 COLONIES/mL  KLEBSIELLA PNEUMONIAE    Labs: CBC: Recent Labs  Lab 11/24/21 1656 11/25/21 1222 11/26/21 0111 11/27/21 0143  WBC 9.4 6.4 5.9  --   NEUTROABS  --  4.4  --   --   HGB 9.4* 8.7* 7.8* 8.7*  HCT 29.4* 27.0* 23.2* 26.8*  MCV 90.2 91.5 87.5  --   PLT 359 310 293  --    Basic Metabolic Panel: Recent Labs  Lab 11/25/21 1222 11/25/21 1800 11/26/21 0111 11/27/21 0143 11/28/21 0157 11/29/21 0301  NA 140  --  140 138 140 138  K 3.1*  --  3.6 3.6 3.9 3.6  CL 110  --  109 109 110 107  CO2 19*  --  21* 17* 21* 22  GLUCOSE 155*  --  144* 95 133* 127*  BUN 35*  --  36* 36* 39* 39*  CREATININE 3.79*  --  3.90* 3.92* 3.85* 4.04*  CALCIUM 8.2*  --  8.2* 8.2* 8.6* 8.5*  MG  --  1.7  --   --   --   --    Liver Function Tests: Recent Labs  Lab 11/26/21 0111  AST 18  ALT 10  ALKPHOS 68  BILITOT 0.4  PROT 6.4*  ALBUMIN 2.7*   CBG: Recent Labs  Lab 11/28/21 1243 11/28/21 1555 11/28/21 2117 11/29/21 0853 11/29/21 1246  GLUCAP 102* 140* 171* 105* 112*    Discharge time spent: greater than 30 minutes.  Signed: Barton Dubois, MD [facility] 11/29/2021

## 2021-11-29 NOTE — Progress Notes (Signed)
Mobility Specialist - Progress Note   11/29/21 1536  Mobility  Activity Ambulated with assistance in hallway  Level of Assistance Contact guard assist, steadying assist  Assistive Device Front wheel walker  Distance Ambulated (ft) 240 ft  Activity Response Tolerated well  Mobility Referral Yes  $Mobility charge 1 Mobility   Pt received in bed and agreeable to mobility. Pt c/o RLE pain throughout ambulation. Pt was returned to bed with all needs met.   Franki Monte  Mobility Specialist Please contact via Solicitor or Rehab office at [phone]

## 2021-12-22 ENCOUNTER — Emergency Department (HOSPITAL_COMMUNITY): Payer: Medicaid Other

## 2021-12-22 ENCOUNTER — Other Ambulatory Visit: Payer: Self-pay

## 2021-12-22 ENCOUNTER — Encounter (HOSPITAL_COMMUNITY): Payer: Self-pay

## 2021-12-22 ENCOUNTER — Inpatient Hospital Stay (HOSPITAL_COMMUNITY)
Admission: EM | Admit: 2021-12-22 | Discharge: 2021-12-30 | DRG: 193 | Disposition: A | Discharge status: Home Health | Payer: Medicaid Other | Attending: Internal Medicine | Admitting: Internal Medicine

## 2021-12-22 DIAGNOSIS — E1122 Type 2 diabetes mellitus with diabetic chronic kidney disease: Secondary | ICD-10-CM | POA: Diagnosis present

## 2021-12-22 DIAGNOSIS — N138 Other obstructive and reflux uropathy: Secondary | ICD-10-CM | POA: Diagnosis present

## 2021-12-22 DIAGNOSIS — Z1612 Extended spectrum beta lactamase (ESBL) resistance: Secondary | ICD-10-CM | POA: Diagnosis present

## 2021-12-22 DIAGNOSIS — N390 Urinary tract infection, site not specified: Secondary | ICD-10-CM | POA: Diagnosis present

## 2021-12-22 DIAGNOSIS — N401 Enlarged prostate with lower urinary tract symptoms: Secondary | ICD-10-CM | POA: Diagnosis present

## 2021-12-22 DIAGNOSIS — E1143 Type 2 diabetes mellitus with diabetic autonomic (poly)neuropathy: Secondary | ICD-10-CM | POA: Diagnosis present

## 2021-12-22 DIAGNOSIS — B961 Klebsiella pneumoniae [K. pneumoniae] as the cause of diseases classified elsewhere: Secondary | ICD-10-CM | POA: Diagnosis present

## 2021-12-22 DIAGNOSIS — E11319 Type 2 diabetes mellitus with unspecified diabetic retinopathy without macular edema: Secondary | ICD-10-CM | POA: Diagnosis present

## 2021-12-22 DIAGNOSIS — N184 Chronic kidney disease, stage 4 (severe): Secondary | ICD-10-CM | POA: Diagnosis present

## 2021-12-22 DIAGNOSIS — F419 Anxiety disorder, unspecified: Secondary | ICD-10-CM | POA: Diagnosis present

## 2021-12-22 DIAGNOSIS — I5031 Acute diastolic (congestive) heart failure: Secondary | ICD-10-CM | POA: Diagnosis not present

## 2021-12-22 DIAGNOSIS — R0600 Dyspnea, unspecified: Principal | ICD-10-CM

## 2021-12-22 DIAGNOSIS — Z806 Family history of leukemia: Secondary | ICD-10-CM

## 2021-12-22 DIAGNOSIS — N400 Enlarged prostate without lower urinary tract symptoms: Secondary | ICD-10-CM | POA: Diagnosis present

## 2021-12-22 DIAGNOSIS — Z7902 Long term (current) use of antithrombotics/antiplatelets: Secondary | ICD-10-CM

## 2021-12-22 DIAGNOSIS — E872 Acidosis, unspecified: Secondary | ICD-10-CM | POA: Diagnosis present

## 2021-12-22 DIAGNOSIS — E1142 Type 2 diabetes mellitus with diabetic polyneuropathy: Secondary | ICD-10-CM | POA: Diagnosis present

## 2021-12-22 DIAGNOSIS — Z1152 Encounter for screening for COVID-19: Secondary | ICD-10-CM | POA: Diagnosis not present

## 2021-12-22 DIAGNOSIS — I25119 Atherosclerotic heart disease of native coronary artery with unspecified angina pectoris: Secondary | ICD-10-CM | POA: Diagnosis present

## 2021-12-22 DIAGNOSIS — N179 Acute kidney failure, unspecified: Secondary | ICD-10-CM | POA: Diagnosis present

## 2021-12-22 DIAGNOSIS — Z9079 Acquired absence of other genital organ(s): Secondary | ICD-10-CM

## 2021-12-22 DIAGNOSIS — J9691 Respiratory failure, unspecified with hypoxia: Secondary | ICD-10-CM | POA: Diagnosis present

## 2021-12-22 DIAGNOSIS — Z8249 Family history of ischemic heart disease and other diseases of the circulatory system: Secondary | ICD-10-CM

## 2021-12-22 DIAGNOSIS — E119 Type 2 diabetes mellitus without complications: Secondary | ICD-10-CM | POA: Diagnosis present

## 2021-12-22 DIAGNOSIS — I5043 Acute on chronic combined systolic (congestive) and diastolic (congestive) heart failure: Secondary | ICD-10-CM | POA: Diagnosis present

## 2021-12-22 DIAGNOSIS — J189 Pneumonia, unspecified organism: Principal | ICD-10-CM | POA: Diagnosis present

## 2021-12-22 DIAGNOSIS — I2510 Atherosclerotic heart disease of native coronary artery without angina pectoris: Secondary | ICD-10-CM | POA: Diagnosis present

## 2021-12-22 DIAGNOSIS — Z794 Long term (current) use of insulin: Secondary | ICD-10-CM | POA: Diagnosis not present

## 2021-12-22 DIAGNOSIS — K219 Gastro-esophageal reflux disease without esophagitis: Secondary | ICD-10-CM | POA: Diagnosis present

## 2021-12-22 DIAGNOSIS — Z888 Allergy status to other drugs, medicaments and biological substances status: Secondary | ICD-10-CM

## 2021-12-22 DIAGNOSIS — I5033 Acute on chronic diastolic (congestive) heart failure: Secondary | ICD-10-CM | POA: Diagnosis not present

## 2021-12-22 DIAGNOSIS — Z832 Family history of diseases of the blood and blood-forming organs and certain disorders involving the immune mechanism: Secondary | ICD-10-CM

## 2021-12-22 DIAGNOSIS — R338 Other retention of urine: Secondary | ICD-10-CM | POA: Diagnosis present

## 2021-12-22 DIAGNOSIS — I130 Hypertensive heart and chronic kidney disease with heart failure and stage 1 through stage 4 chronic kidney disease, or unspecified chronic kidney disease: Secondary | ICD-10-CM | POA: Diagnosis present

## 2021-12-22 DIAGNOSIS — F32A Depression, unspecified: Secondary | ICD-10-CM | POA: Diagnosis present

## 2021-12-22 DIAGNOSIS — K3184 Gastroparesis: Secondary | ICD-10-CM | POA: Diagnosis present

## 2021-12-22 DIAGNOSIS — E861 Hypovolemia: Secondary | ICD-10-CM | POA: Diagnosis present

## 2021-12-22 DIAGNOSIS — Z79899 Other long term (current) drug therapy: Secondary | ICD-10-CM

## 2021-12-22 DIAGNOSIS — Z800 Family history of malignant neoplasm of digestive organs: Secondary | ICD-10-CM

## 2021-12-22 DIAGNOSIS — Z823 Family history of stroke: Secondary | ICD-10-CM

## 2021-12-22 DIAGNOSIS — D631 Anemia in chronic kidney disease: Secondary | ICD-10-CM | POA: Diagnosis present

## 2021-12-22 DIAGNOSIS — Z91040 Latex allergy status: Secondary | ICD-10-CM

## 2021-12-22 DIAGNOSIS — E785 Hyperlipidemia, unspecified: Secondary | ICD-10-CM | POA: Diagnosis present

## 2021-12-22 DIAGNOSIS — M549 Dorsalgia, unspecified: Secondary | ICD-10-CM | POA: Diagnosis present

## 2021-12-22 DIAGNOSIS — Z8051 Family history of malignant neoplasm of kidney: Secondary | ICD-10-CM

## 2021-12-22 DIAGNOSIS — I10 Essential (primary) hypertension: Secondary | ICD-10-CM | POA: Diagnosis present

## 2021-12-22 LAB — CBC WITH DIFFERENTIAL/PLATELET
Abs Immature Granulocytes: 0.06 10*3/uL (ref 0.00–0.07)
Basophils Absolute: 0 10*3/uL (ref 0.0–0.1)
Basophils Relative: 0 %
Eosinophils Absolute: 0 10*3/uL (ref 0.0–0.5)
Eosinophils Relative: 0 %
HCT: 28.7 % — ABNORMAL LOW (ref 39.0–52.0)
Hemoglobin: 9.1 g/dL — ABNORMAL LOW (ref 13.0–17.0)
Immature Granulocytes: 0 %
Lymphocytes Relative: 6 %
Lymphs Abs: 0.8 10*3/uL (ref 0.7–4.0)
MCH: 29.2 pg (ref 26.0–34.0)
MCHC: 31.7 g/dL (ref 30.0–36.0)
MCV: 92 fL (ref 80.0–100.0)
Monocytes Absolute: 0.8 10*3/uL (ref 0.1–1.0)
Monocytes Relative: 6 %
Neutro Abs: 12 10*3/uL — ABNORMAL HIGH (ref 1.7–7.7)
Neutrophils Relative %: 88 %
Platelets: 296 10*3/uL (ref 150–400)
RBC: 3.12 MIL/uL — ABNORMAL LOW (ref 4.22–5.81)
RDW: 15.9 % — ABNORMAL HIGH (ref 11.5–15.5)
WBC: 13.6 10*3/uL — ABNORMAL HIGH (ref 4.0–10.5)
nRBC: 0 % (ref 0.0–0.2)

## 2021-12-22 LAB — COMPREHENSIVE METABOLIC PANEL
ALT: 12 U/L (ref 0–44)
AST: 23 U/L (ref 15–41)
Albumin: 3.3 g/dL — ABNORMAL LOW (ref 3.5–5.0)
Alkaline Phosphatase: 71 U/L (ref 38–126)
Anion gap: 12 (ref 5–15)
BUN: 58 mg/dL — ABNORMAL HIGH (ref 8–23)
CO2: 19 mmol/L — ABNORMAL LOW (ref 22–32)
Calcium: 8.5 mg/dL — ABNORMAL LOW (ref 8.9–10.3)
Chloride: 105 mmol/L (ref 98–111)
Creatinine, Ser: 4.97 mg/dL — ABNORMAL HIGH (ref 0.61–1.24)
GFR, Estimated: 13 mL/min — ABNORMAL LOW (ref >60)
Glucose, Bld: 233 mg/dL — ABNORMAL HIGH (ref 70–99)
Potassium: 3.6 mmol/L (ref 3.5–5.1)
Sodium: 136 mmol/L (ref 135–145)
Total Bilirubin: 1 mg/dL (ref 0.3–1.2)
Total Protein: 8.2 g/dL — ABNORMAL HIGH (ref 6.5–8.1)

## 2021-12-22 LAB — GLUCOSE, CAPILLARY
Glucose-Capillary: 244 mg/dL — ABNORMAL HIGH (ref 70–99)
Glucose-Capillary: 294 mg/dL — ABNORMAL HIGH (ref 70–99)

## 2021-12-22 LAB — URINALYSIS, ROUTINE W REFLEX MICROSCOPIC
Bilirubin Urine: NEGATIVE
Glucose, UA: 50 mg/dL — AB
Ketones, ur: NEGATIVE mg/dL
Nitrite: NEGATIVE
Protein, ur: >=300 mg/dL — AB
Specific Gravity, Urine: 1.016 (ref 1.005–1.030)
WBC, UA: >50 WBC/hpf — ABNORMAL HIGH (ref 0–5)
pH: 5 (ref 5.0–8.0)

## 2021-12-22 LAB — RESP PANEL BY RT-PCR (FLU A&B, COVID) ARPGX2
Influenza A by PCR: NEGATIVE
Influenza B by PCR: NEGATIVE
SARS Coronavirus 2 by RT PCR: NEGATIVE

## 2021-12-22 LAB — BRAIN NATRIURETIC PEPTIDE: B Natriuretic Peptide: 1896.6 pg/mL — ABNORMAL HIGH (ref 0.0–100.0)

## 2021-12-22 LAB — LIPASE, BLOOD: Lipase: 28 U/L (ref 11–51)

## 2021-12-22 LAB — PROCALCITONIN: Procalcitonin: 0.62 ng/mL

## 2021-12-22 MED ORDER — INSULIN ASPART 100 UNIT/ML IJ SOLN
0.0000 [IU] | Freq: Every day | INTRAMUSCULAR | Status: DC
  Administered 2021-12-22: 3 [IU] via SUBCUTANEOUS
  Administered 2021-12-25: 2 [IU] via SUBCUTANEOUS

## 2021-12-22 MED ORDER — FUROSEMIDE 10 MG/ML IJ SOLN: 60.0000 mg | Freq: Every day | INTRAMUSCULAR | Status: DC

## 2021-12-22 MED ORDER — SODIUM CHLORIDE 0.9 % IV SOLN
500.0000 mg | INTRAVENOUS | Status: DC
  Administered 2021-12-22 – 2021-12-24 (×3): 500 mg via INTRAVENOUS
  Filled 2021-12-22 (×4): qty 5

## 2021-12-22 MED ORDER — FUROSEMIDE 10 MG/ML IJ SOLN
60.0000 mg | Freq: Once | INTRAMUSCULAR | Status: AC
  Administered 2021-12-22: 60 mg via INTRAVENOUS
  Filled 2021-12-22: qty 8

## 2021-12-22 MED ORDER — HEPARIN SODIUM (PORCINE) 5000 UNIT/ML IJ SOLN
5000.0000 [IU] | Freq: Three times a day (TID) | INTRAMUSCULAR | Status: DC
  Administered 2021-12-22 – 2021-12-30 (×23): 5000 [IU] via SUBCUTANEOUS
  Filled 2021-12-22 (×23): qty 1

## 2021-12-22 MED ORDER — PROCHLORPERAZINE EDISYLATE 10 MG/2ML IJ SOLN
10.0000 mg | Freq: Four times a day (QID) | INTRAMUSCULAR | Status: DC | PRN
  Administered 2021-12-22 – 2021-12-28 (×2): 10 mg via INTRAVENOUS
  Filled 2021-12-22 (×2): qty 2

## 2021-12-22 MED ORDER — GUAIFENESIN ER 600 MG PO TB12
600.0000 mg | ORAL_TABLET | Freq: Two times a day (BID) | ORAL | Status: DC
  Administered 2021-12-22 – 2021-12-30 (×16): 600 mg via ORAL
  Filled 2021-12-22 (×16): qty 1

## 2021-12-22 MED ORDER — SODIUM CHLORIDE 0.9 % IV SOLN
1.0000 g | INTRAVENOUS | Status: DC
  Administered 2021-12-22 – 2021-12-27 (×6): 1 g via INTRAVENOUS
  Filled 2021-12-22 (×6): qty 10

## 2021-12-22 MED ORDER — INSULIN ASPART 100 UNIT/ML IJ SOLN
0.0000 [IU] | Freq: Three times a day (TID) | INTRAMUSCULAR | Status: DC
  Administered 2021-12-23 – 2021-12-24 (×4): 2 [IU] via SUBCUTANEOUS
  Administered 2021-12-24 – 2021-12-28 (×7): 1 [IU] via SUBCUTANEOUS
  Administered 2021-12-28: 2 [IU] via SUBCUTANEOUS
  Administered 2021-12-29 – 2021-12-30 (×2): 1 [IU] via SUBCUTANEOUS

## 2021-12-22 MED ORDER — ACETAMINOPHEN 325 MG PO TABS
650.0000 mg | ORAL_TABLET | Freq: Four times a day (QID) | ORAL | Status: DC | PRN
  Administered 2021-12-23 – 2021-12-26 (×3): 650 mg via ORAL
  Filled 2021-12-22 (×3): qty 2

## 2021-12-22 MED ORDER — ACETAMINOPHEN 650 MG RE SUPP: 650.0000 mg | Freq: Four times a day (QID) | RECTAL | Status: DC | PRN

## 2021-12-22 NOTE — ED Triage Notes (Addendum)
Pt BIB EMS with reports of abdominal pain and not feeling well. Pt states that he feels sick. Pt placed on 4L [place] due to low o2 saturation with ems.

## 2021-12-22 NOTE — ED Provider Notes (Signed)
[facility] DEPT Provider Note   CSN: [id number] Arrival date & time: 12/22/21  7654     History  Chief Complaint  Patient presents with   Abdominal Pain    Ryan Jenkins is a 61 y.o. male.  61 year old male presents with 3 days of cough and congestion.  Has had abdominal pain from the coughing.  Denies any emesis.  No dark or tarry stools.  Notes increasing dyspnea on exertion as well as orthopnea.  Patient normally is not on oxygen.  Does have a history of CHF and recent hospitalization for this about 3 weeks ago.  Any anginal type chest pain       Home Medications Prior to Admission medications   Medication Sig Start Date End Date Taking? Authorizing Provider  acetaminophen (TYLENOL) 500 MG tablet Take 1,000 mg by mouth every 6 (six) hours as needed for mild pain or headache.    [provider]  amLODipine (NORVASC) 10 MG tablet Take 1 tablet (10 mg total) by mouth daily. 02/08/21   Danford, Suann Larry, MD  atorvastatin (LIPITOR) 40 MG tablet Take 1 tablet (40 mg total) by mouth daily. 02/08/21   Danford, Suann Larry, MD  Cholecalciferol (VITAMIN D3) 1.25 MG (50000 UT) CAPS Take 50,000 Units by mouth once a week. 09/12/21   [provider]  clopidogrel (PLAVIX) 75 MG tablet Take 1 tablet (75 mg total) by mouth daily. Patient not taking: Reported on 11/25/2021 07/05/21   de Yolanda Manges, Cortney E, NP  clotrimazole (LOTRIMIN) 1 % cream Apply 1 Application topically 2 (two) times daily as needed (rash). 08/01/21   [provider]  docusate sodium (COLACE) 100 MG capsule Take 1 capsule (100 mg total) by mouth daily as needed for up to 30 doses. 10/16/21   Janith Lima, MD  escitalopram (LEXAPRO) 20 MG tablet Take 1 tablet (20 mg total) by mouth daily. 07/20/20   Charlott Rakes, MD  famotidine (PEPCID) 40 MG tablet Take 40 mg by mouth daily. 08/08/21   [provider]  finasteride (PROSCAR) 5 MG tablet Take 1 tablet  (5 mg total) by mouth daily. 07/20/20   Charlott Rakes, MD  furosemide (LASIX) 40 MG tablet Take 1.5 tablets (60 mg total) by mouth 2 (two) times daily. 11/29/21   Barton Dubois, MD  gabapentin (NEURONTIN) 300 MG capsule Take 1 capsule (300 mg total) by mouth daily as needed (neuropathic pain). 11/29/21   Barton Dubois, MD  hydrALAZINE (APRESOLINE) 100 MG tablet Take 1 tablet (100 mg total) by mouth 3 (three) times daily. 02/08/21   Danford, Suann Larry, MD  LANTUS SOLOSTAR 100 UNIT/ML Solostar Pen Inject 22 Units into the skin at bedtime. 11/29/21   Barton Dubois, MD  meclizine (ANTIVERT) 25 MG tablet Take 25 mg by mouth 3 (three) times daily as needed for dizziness. 08/08/21   [provider]  metoprolol succinate (TOPROL-XL) 50 MG 24 hr tablet Take 1 tablet (50 mg total) by mouth at bedtime. Take with or immediately following a meal. Patient taking differently: Take 50 mg by mouth daily. Take with or immediately following a meal. 02/08/21   Danford, Suann Larry, MD  nortriptyline (PAMELOR) 25 MG capsule Take 1 capsule (25 mg total) by mouth at bedtime. 07/20/20   Charlott Rakes, MD  oxyCODONE-acetaminophen (PERCOCET) 5-325 MG tablet Take 1 tablet by mouth every 4 (four) hours as needed for up to 18 doses for severe pain. 10/16/21   Janith Lima, MD  pantoprazole (  PROTONIX) 40 MG tablet Take 1 tablet (40 mg total) by mouth daily. 02/08/21   Danford, Suann Larry, MD  potassium chloride (KLOR-CON M) 10 MEQ tablet Take 1 tablet (10 mEq total) by mouth daily. 02/08/21   Danford, Suann Larry, MD  sodium bicarbonate 650 MG tablet Take 650 mg by mouth 2 (two) times daily. 09/20/21   [provider]  tamsulosin (FLOMAX) 0.4 MG CAPS capsule Take 0.4 mg by mouth at bedtime. 05/25/21   [provider]  triamcinolone ointment (KENALOG) 0.5 % Apply 1 Application topically 2 (two) times daily as needed (rash). 08/31/21   [provider]      Allergies    Claritin  [loratadine], Hydrochlorothiazide, Latex, Lyrica [pregabalin], and Metformin and related    Review of Systems   Review of Systems  All other systems reviewed and are negative.   Physical Exam Updated Vital Signs BP (!) 166/84 (BP Location: Right Arm)   Pulse (!) 102   Temp 99.3 F (37.4 C) (Oral)   Resp 20   Ht 1.88 m ('6\' 2"'$ )   Wt 93.4 kg   SpO2 90%   BMI 26.45 kg/m  Physical Exam Vitals and nursing note reviewed.  Constitutional:      General: He is not in acute distress.    Appearance: Normal appearance. He is well-developed. He is not toxic-appearing.  HENT:     Head: Normocephalic and atraumatic.  Eyes:     General: Lids are normal.     Conjunctiva/sclera: Conjunctivae normal.     Pupils: Pupils are equal, round, and reactive to light.  Neck:     Thyroid: No thyroid mass.     Trachea: No tracheal deviation.  Cardiovascular:     Rate and Rhythm: Normal rate and regular rhythm.     Heart sounds: Normal heart sounds. No murmur heard.    No gallop.  Pulmonary:     Effort: Pulmonary effort is normal. No respiratory distress.     Breath sounds: No stridor. Decreased breath sounds and rhonchi present. No wheezing or rales.  Abdominal:     General: There is no distension.     Palpations: Abdomen is soft.     Tenderness: There is no abdominal tenderness. There is no rebound.  Musculoskeletal:        General: No tenderness. Normal range of motion.     Cervical back: Normal range of motion and neck supple.  Skin:    General: Skin is warm and dry.     Findings: No abrasion or rash.  Neurological:     Mental Status: He is alert and oriented to person, place, and time. Mental status is at baseline.     GCS: GCS eye subscore is 4. GCS verbal subscore is 5. GCS motor subscore is 6.     Cranial Nerves: No cranial nerve deficit.     Sensory: No sensory deficit.     Motor: Motor function is intact.  Psychiatric:        Attention and Perception: Attention normal.         Speech: Speech normal.        Behavior: Behavior normal.     ED Results / Procedures / Treatments   Labs (all labs ordered are listed, but only abnormal results are displayed) Labs Reviewed  COMPREHENSIVE METABOLIC PANEL - Abnormal; Notable for the following components:      Result Value   CO2 19 (*)    Glucose, Bld 233 (*)  BUN 58 (*)    Creatinine, Ser 4.97 (*)    Calcium 8.5 (*)    Total Protein 8.2 (*)    Albumin 3.3 (*)    GFR, Estimated 13 (*)    All other components within normal limits  CBC WITH DIFFERENTIAL/PLATELET - Abnormal; Notable for the following components:   WBC 13.6 (*)    RBC 3.12 (*)    Hemoglobin 9.1 (*)    HCT 28.7 (*)    RDW 15.9 (*)    Neutro Abs 12.0 (*)    All other components within normal limits  BRAIN NATRIURETIC PEPTIDE - Abnormal; Notable for the following components:   B Natriuretic Peptide 1,896.6 (*)    All other components within normal limits  RESP PANEL BY RT-PCR (FLU A&B, COVID) ARPGX2  LIPASE, BLOOD  URINALYSIS, ROUTINE W REFLEX MICROSCOPIC    EKG EKG Interpretation  Date/Time:  Friday December 22 2021 11:17:12 EST Ventricular Rate:  103 PR Interval:  173 QRS Duration: 101 QT Interval:  378 QTC Calculation: 495 R Axis:   -36 Text Interpretation: Sinus tachycardia Probable left atrial enlargement Left axis deviation Borderline ST depression, lateral leads Borderline prolonged QT interval Confirmed by Lacretia Leigh ([id number]) on 12/22/2021 11:24:48 AM  Radiology DG Chest 2 View  Result Date: 12/22/2021 CLINICAL DATA:  61 year old male with shortness of breath, abdominal pain, malaise. EXAM: CHEST - 2 VIEW COMPARISON:  Chest radiographs 11/25/2021 and earlier. FINDINGS: Semi upright AP and lateral views of the chest at 0708 hours. New widespread although indistinct right lung opacity, more conspicuous in the lower lobe on the lateral view. Possible early lung consolidation, no definite air bronchograms. Stable somewhat low lung  volumes from last month. No pleural effusion. Mediastinal contours stable and within normal limits. Visualized tracheal air column is within normal limits. Left lung appears to remain negative. No acute osseous abnormality identified. Negative visible bowel gas. IMPRESSION: New widespread although indistinct right lung opacity, somewhat confluent in the lower lobe on the lateral view and suspicious for multilobar Pneumonia in this setting. No pleural effusion. If there are clinical signs/symptoms of infection then Followup PA and lateral chest X-ray is recommended in 3-4 weeks following trial of antibiotic therapy to ensure resolution and exclude underlying malignancy. Electronically Signed   By: Genevie Ann M.D.   On: 12/22/2021 07:31    Procedures Procedures    Medications Ordered in ED Medications - No data to display  ED Course/ Medical Decision Making/ A&P                           Medical Decision Making Amount and/or Complexity of Data Reviewed Labs: ordered.   Patient is EKG per my interpretation shows sinus tachycardia.  No signs of acute ischemic changes.  Chest x-ray consistent with CHF versus multifocal pneumonia.  Patient has had subjective fever and chills.  Will cover with IV antibiotics.  Will also start patient on diuretics.  Patient is BNP consistent with heart failure.  COVID and flu test negative.  Due to patient's new oxygen requirement he will require inpatient hospitalization.  Will consult hospitalist team  CRITICAL CARE Performed by: Leota Jacobsen Total critical care time: 45 minutes Critical care time was exclusive of separately billable procedures and treating other patients. Critical care was necessary to treat or prevent imminent or life-threatening deterioration. Critical care was time spent personally by me on the following activities: development of treatment plan with patient and/or  surrogate as well as nursing, discussions with consultants, evaluation of  patient's response to treatment, examination of patient, obtaining history from patient or surrogate, ordering and performing treatments and interventions, ordering and review of laboratory studies, ordering and review of radiographic studies, pulse oximetry and re-evaluation of patient's condition.         Final Clinical Impression(s) / ED Diagnoses Final diagnoses:  None    Rx / DC Orders ED Discharge Orders     None         Lacretia Leigh, MD 12/22/21 1130

## 2021-12-22 NOTE — ED Notes (Signed)
Increased O2 to 5 lpm

## 2021-12-22 NOTE — ED Notes (Signed)
Pt attempted to provide a urine sample. Pt unable to produce a urine sample at this time.

## 2021-12-22 NOTE — ED Provider Triage Note (Signed)
Emergency Medicine Provider Triage Evaluation Note  Ryan Jenkins , a 61 y.o. male  was evaluated in triage.  Pt complains of abdominal pain and not feeling well over the last couple of days. Noted to be hypoxic on arrival, placed on 4L [place], not on oxygen at baseline. Recent discharge from the hospital on 11/15 due to acute exacerbation of heart failure with unintentional weight gain and shortness of breath. Today some leg swelling and abdominal pain along with feeling hot.  Review of Systems  Positive: Shortness of breath, abdominal pain, cough Negative: Fever,chest pain  Physical Exam  BP (!) 163/71 (BP Location: Left Arm)   Pulse 99   Temp 98.5 F (36.9 C) (Oral)   Resp 18   Ht '6\' 2"'$  (1.88 m)   Wt 93.4 kg   SpO2 91%   BMI 26.45 kg/m  Gen:   Awake, no distress   Resp:  Normal effort  MSK:   Moves extremities without difficulty  Other:  Lungs are diminished throughout. 1+ pitting edema bilaterally   Medical Decision Making  Medically screening exam initiated at 6:39 AM.  Appropriate orders placed.  Ryan Jenkins was informed that the remainder of the evaluation will be completed by another provider, this initial triage assessment does not replace that evaluation, and the importance of remaining in the ED until their evaluation is complete.  Patient here with sob and new oxygen requirement, underlying CHF, CAD, CKD. Recent dc from hospital last month for acute exacerbation of heart failure.   Janeece Fitting, PA-C 12/22/21 [phone]

## 2021-12-22 NOTE — H&P (Signed)
History and Physical    Patient: Ryan Jenkins FEO:[id number] DOB: [birth year] DOA: 12/22/2021 DOS: the patient was seen and examined on 12/22/2021 PCP: [name], MD  Patient coming from: Home  Chief Complaint:  Chief Complaint  Patient presents with   Abdominal Pain   HPI: Ryan Jenkins is a 61 y.o. male with medical history significant of DM, chronic HFpEF, HLD, CAD, GERD, BPH, CKD4, HTN. Presenting with cough, congestion and body aches. His symptoms started about 3-4 days ago. He had a productive cough and sinus congestion. He tried some OTC congestion meds, but they didn't help. He began to develop abdominal pain as his coughing continued. He had chills, subjective fever, N/V. He didn't have any diarrhea or sick contacts. He does report difficulty laying flat. When his symptoms did not improve this morning, he decided to come to the ED for evaluation. He denies any other aggravating or alleviating factors.    Review of Systems: As mentioned in the history of present illness. All other systems reviewed and are negative. Past Medical History:  Diagnosis Date   Anemia    Anginal pain ([place])    Anxiety    Arthritis    BPH with obstruction/lower urinary tract symptoms    CAD (coronary artery disease)    cardiologist--- dr Junius Roads badal;  11-06-2019 cardiac cath in [place] (result in care everywhere)  nonobstructive cad involing pRCA 60% (done in setting worseing CHF/ acute pulmonary edema requiring intubation/ AKI   Chronic combined systolic and diastolic CHF (congestive heart failure) ([place])    Chronic kidney disease, stage IV (severe) ([place])    nephrologist--- dr Carolin Sicks   Depression    Diabetic neuropathy ([place])    Dyspnea    Edema of both lower extremities    GERD (gastroesophageal reflux disease)    Heart murmur    History of community acquired pneumonia    admission 06-04-2021 in [facility]  w/ ARF hypoxia w/ severe sepsis   Hyperlipidemia    Hypertension    Insulin  dependent type 2 diabetes mellitus ([place])    Pneumonia    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 He has never used smokeless tobacco. He reports that he does not drink alcohol and does not use drugs.  Allergies  Allergen Reactions   Claritin [Loratadine] Swelling    Joint swelling    Hydrochlorothiazide Other (See Comments)    Dizziness   Latex Hives   Lyrica [Pregabalin] Other (See Comments)    depression   Metformin And Related Other (See Comments)    GI    Family History  Problem Relation Age of Onset   Renal cancer Mother    Hypertension Mother    Pancreatic cancer Mother    Hypertension Sister    Stroke Sister    Leukemia Maternal Uncle    Sickle cell trait Maternal Aunt  Colon cancer Neg Hx    Esophageal cancer Neg Hx    Rectal cancer Neg Hx    Stomach cancer Neg Hx     Prior to Admission medications   Medication Sig Start Date End Date Taking? Authorizing Provider  acetaminophen (TYLENOL) 500 MG tablet Take 1,000 mg by mouth every 6 (six) hours as needed for mild pain or headache.    [provider]  amLODipine (NORVASC) 10 MG tablet Take 1 tablet (10 mg total) by mouth daily. 02/08/21   Danford, Suann Larry, MD  atorvastatin (LIPITOR) 40 MG tablet Take 1 tablet (40 mg total) by mouth daily. 02/08/21   Danford, Suann Larry, MD  Cholecalciferol (VITAMIN D3) 1.25 MG (50000 UT)  CAPS Take 50,000 Units by mouth once a week. 09/12/21   [provider]  clopidogrel (PLAVIX) 75 MG tablet Take 1 tablet (75 mg total) by mouth daily. Patient not taking: Reported on 11/25/2021 07/05/21   de Yolanda Manges, Cortney E, NP  clotrimazole (LOTRIMIN) 1 % cream Apply 1 Application topically 2 (two) times daily as needed (rash). 08/01/21   [provider]  docusate sodium (COLACE) 100 MG capsule Take 1 capsule (100 mg total) by mouth daily as needed for up to 30 doses. 10/16/21   Janith Lima, MD  escitalopram (LEXAPRO) 20 MG tablet Take 1 tablet (20 mg total) by mouth daily. 07/20/20   Charlott Rakes, MD  famotidine (PEPCID) 40 MG tablet Take 40 mg by mouth daily. 08/08/21   [provider]  finasteride (PROSCAR) 5 MG tablet Take 1 tablet (5 mg total) by mouth daily. 07/20/20   Charlott Rakes, MD  furosemide (LASIX) 40 MG tablet Take 1.5 tablets (60 mg total) by mouth 2 (two) times daily. 11/29/21   Barton Dubois, MD  gabapentin (NEURONTIN) 300 MG capsule Take 1 capsule (300 mg total) by mouth daily as needed (neuropathic pain). 11/29/21   Barton Dubois, MD  hydrALAZINE (APRESOLINE) 100 MG tablet Take 1 tablet (100 mg total) by mouth 3 (three) times daily. 02/08/21   Danford, Suann Larry, MD  LANTUS SOLOSTAR 100 UNIT/ML Solostar Pen Inject 22 Units into the skin at bedtime. 11/29/21   Barton Dubois, MD  meclizine (ANTIVERT) 25 MG tablet Take 25 mg by mouth 3 (three) times daily as needed for dizziness. 08/08/21   [provider]  metoprolol succinate (TOPROL-XL) 50 MG 24 hr tablet Take 1 tablet (50 mg total) by mouth at bedtime. Take with or immediately following a meal. Patient taking differently: Take 50 mg by mouth daily. Take with or immediately following a meal. 02/08/21   Danford, Suann Larry, MD  nortriptyline (PAMELOR) 25 MG capsule Take 1 capsule (25 mg total) by mouth at bedtime. 07/20/20   Charlott Rakes, MD  oxyCODONE-acetaminophen (PERCOCET) 5-325  MG tablet Take 1 tablet by mouth every 4 (four) hours as needed for up to 18 doses for severe pain. 10/16/21   Janith Lima, MD  pantoprazole (PROTONIX) 40 MG tablet Take 1 tablet (40 mg total) by mouth daily. 02/08/21   Danford, Suann Larry, MD  potassium chloride (KLOR-CON M) 10 MEQ tablet Take 1 tablet (10 mEq total) by mouth daily. 02/08/21   Danford, Suann Larry, MD  sodium bicarbonate 650 MG tablet Take 650 mg by mouth 2 (two) times daily. 09/20/21   [provider]  tamsulosin (FLOMAX) 0.4 MG CAPS capsule Take 0.4 mg by mouth at bedtime. 05/25/21   [provider]  triamcinolone ointment (KENALOG) 0.5 %  Apply 1 Application topically 2 (two) times daily as needed (rash). 08/31/21   [provider]    Physical Exam: Vitals:   12/22/21 1610 12/22/21 0633 12/22/21 1115  BP:  (!) 163/71 (!) 166/84  Pulse:  99 (!) 102  Resp:  18 20  Temp:  98.5 F (36.9 C) 99.3 F (37.4 C)  TempSrc:  Oral Oral  SpO2:  91% 90%  Weight: 93.4 kg    Height: '6\' 2"'$  (1.88 m)     General: 61 y.o. male resting in bed in NAD Eyes: PERRL, normal sclera ENMT: Nares patent w/o discharge, orophaynx clear, dentition normal, ears w/o discharge/lesions/ulcers Neck: Supple, trachea midline Cardiovascular: tachy, +S1, S2, no m/g/r, equal pulses throughout; reproducible chest pain/ab pain on exam Respiratory: decreased at bases, scattered rhonchi, no w/r, increased WOB on 4L [place] GI: BS+, NDNT, no masses noted, no organomegaly noted MSK: No e/c/c Neuro: A&O x 3, no focal deficits Psyc: Appropriate interaction and affect, calm/cooperative  Data Reviewed:  Results for orders placed or performed during the hospital encounter of 12/22/21 (from the past 24 hour(s))  Comprehensive metabolic panel     Status: Abnormal   Collection Time: 12/22/21  7:30 AM  Result Value Ref Range   Sodium 136 135 - 145 mmol/L   Potassium 3.6 3.5 - 5.1 mmol/L   Chloride 105 98 - 111 mmol/L   CO2 19 (L) 22 - 32  mmol/L   Glucose, Bld 233 (H) 70 - 99 mg/dL   BUN 58 (H) 8 - 23 mg/dL   Creatinine, Ser 4.97 (H) 0.61 - 1.24 mg/dL   Calcium 8.5 (L) 8.9 - 10.3 mg/dL   Total Protein 8.2 (H) 6.5 - 8.1 g/dL   Albumin 3.3 (L) 3.5 - 5.0 g/dL   AST 23 15 - 41 U/L   ALT 12 0 - 44 U/L   Alkaline Phosphatase 71 38 - 126 U/L   Total Bilirubin 1.0 0.3 - 1.2 mg/dL   GFR, Estimated 13 (L) >60 mL/min   Anion gap 12 5 - 15  Lipase, blood     Status: None   Collection Time: 12/22/21  7:30 AM  Result Value Ref Range   Lipase 28 11 - 51 U/L  CBC with Diff     Status: Abnormal   Collection Time: 12/22/21  7:30 AM  Result Value Ref Range   WBC 13.6 (H) 4.0 - 10.5 K/uL   RBC 3.12 (L) 4.22 - 5.81 MIL/uL   Hemoglobin 9.1 (L) 13.0 - 17.0 g/dL   HCT 28.7 (L) 39.0 - 52.0 %   MCV 92.0 80.0 - 100.0 fL   MCH 29.2 26.0 - 34.0 pg   MCHC 31.7 30.0 - 36.0 g/dL   RDW 15.9 (H) 11.5 - 15.5 %   Platelets 296 150 - 400 K/uL   nRBC 0.0 0.0 - 0.2 %   Neutrophils Relative % 88 %   Neutro Abs 12.0 (H) 1.7 - 7.7 K/uL   Lymphocytes Relative 6 %   Lymphs Abs 0.8 0.7 - 4.0 K/uL   Monocytes Relative 6 %   Monocytes Absolute 0.8 0.1 - 1.0 K/uL   Eosinophils Relative 0 %   Eosinophils Absolute 0.0 0.0 - 0.5 K/uL   Basophils Relative 0 %   Basophils Absolute 0.0 0.0 - 0.1 K/uL   Immature Granulocytes 0 %   Abs Immature Granulocytes 0.06 0.00 - 0.07 K/uL  Resp Panel by RT-PCR (Flu A&B, Covid) Anterior Nasal Swab     Status:  None   Collection Time: 12/22/21  7:30 AM   Specimen: Anterior Nasal Swab  Result Value Ref Range   SARS Coronavirus 2 by RT PCR NEGATIVE NEGATIVE   Influenza A by PCR NEGATIVE NEGATIVE   Influenza B by PCR NEGATIVE NEGATIVE  Brain natriuretic peptide     Status: Abnormal   Collection Time: 12/22/21  7:30 AM  Result Value Ref Range   B Natriuretic Peptide 1,896.6 (H) 0.0 - 100.0 pg/mL   CXR: New widespread although indistinct right lung opacity, somewhat confluent in the lower lobe on the lateral view and  suspicious for multilobar Pneumonia in this setting. No pleural effusion. If there are clinical signs/symptoms of infection then Followup PA and lateral chest X-ray is recommended in 3-4 weeks following trial of antibiotic therapy to ensure resolution and exclude underlying malignancy.  Assessment and Plan: Multifocal PNA     - admit to inpt, tele     - continue abx     - check procalcitonin, urine legionella/strep     - guaifenesin     - wean O2 as able  Acute on chronic HFpEF     - BNP is up from admission last month for acute on chronic HF     - continue lasix     - watch I&O and daily wts   N/V Gastroparesis      - compazine  DM2     - SSI, glucose checks, DM diet  HLD     - continue home regimen when confirmed  CAD     - continue home regimen when confirmed  GERD     - PPI  BPH     - continue home regimen when confirmed  AKI on CKD4     - check renal US     - if SCR not improving by AM, consult nephro     - will be getting lasix, watch nephrotoxins overal  HTN     - continue home regimen when confirmed  Anemia of chronic disease     - at baseline, follow  Advance Care Planning:   Code Status: FULL  Consults: None  Family Communication: None at bedside  Severity of Illness: The appropriate patient status for this patient is INPATIENT. Inpatient status is judged to be reasonable and necessary in order to provide the required intensity of service to ensure the patient's safety. The patient's presenting symptoms, physical exam findings, and initial radiographic and laboratory data in the context of their chronic comorbidities is felt to place them at high risk for further clinical deterioration. Furthermore, it is not anticipated that the patient will be medically stable for discharge from the hospital within 2 midnights of admission.   * I certify that at the point of admission it is my clinical judgment that the patient will require inpatient hospital  care spanning beyond 2 midnights from the point of admission due to high intensity of service, high risk for further deterioration and high frequency of surveillance required.*  Author: Jonnie Finner, DO 12/22/2021 12:26 PM  For on call review [email or web address].

## 2021-12-23 ENCOUNTER — Inpatient Hospital Stay (HOSPITAL_COMMUNITY): Payer: Medicaid Other

## 2021-12-23 DIAGNOSIS — N179 Acute kidney failure, unspecified: Secondary | ICD-10-CM | POA: Diagnosis not present

## 2021-12-23 DIAGNOSIS — I5033 Acute on chronic diastolic (congestive) heart failure: Secondary | ICD-10-CM

## 2021-12-23 DIAGNOSIS — N184 Chronic kidney disease, stage 4 (severe): Secondary | ICD-10-CM

## 2021-12-23 DIAGNOSIS — D631 Anemia in chronic kidney disease: Secondary | ICD-10-CM

## 2021-12-23 DIAGNOSIS — J189 Pneumonia, unspecified organism: Secondary | ICD-10-CM | POA: Diagnosis not present

## 2021-12-23 DIAGNOSIS — I5031 Acute diastolic (congestive) heart failure: Secondary | ICD-10-CM | POA: Diagnosis not present

## 2021-12-23 LAB — ECHOCARDIOGRAM COMPLETE
Area-P 1/2: 6.83 cm2
Calc EF: 35.7 %
Height: 74 in
MV VTI: 2.41 cm2
S' Lateral: 3 cm
Single Plane A2C EF: 42 %
Single Plane A4C EF: 36.9 %
Weight: 3296 oz

## 2021-12-23 LAB — COMPREHENSIVE METABOLIC PANEL
ALT: 26 U/L (ref 0–44)
AST: 41 U/L (ref 15–41)
Albumin: 3.2 g/dL — ABNORMAL LOW (ref 3.5–5.0)
Alkaline Phosphatase: 119 U/L (ref 38–126)
Anion gap: 13 (ref 5–15)
BUN: 66 mg/dL — ABNORMAL HIGH (ref 8–23)
CO2: 18 mmol/L — ABNORMAL LOW (ref 22–32)
Calcium: 8.5 mg/dL — ABNORMAL LOW (ref 8.9–10.3)
Chloride: 107 mmol/L (ref 98–111)
Creatinine, Ser: 5.43 mg/dL — ABNORMAL HIGH (ref 0.61–1.24)
GFR, Estimated: 11 mL/min — ABNORMAL LOW (ref >60)
Glucose, Bld: 193 mg/dL — ABNORMAL HIGH (ref 70–99)
Potassium: 3.6 mmol/L (ref 3.5–5.1)
Sodium: 138 mmol/L (ref 135–145)
Total Bilirubin: 1.4 mg/dL — ABNORMAL HIGH (ref 0.3–1.2)
Total Protein: 7.9 g/dL (ref 6.5–8.1)

## 2021-12-23 LAB — CBC
HCT: 25.8 % — ABNORMAL LOW (ref 39.0–52.0)
Hemoglobin: 8.2 g/dL — ABNORMAL LOW (ref 13.0–17.0)
MCH: 29.1 pg (ref 26.0–34.0)
MCHC: 31.8 g/dL (ref 30.0–36.0)
MCV: 91.5 fL (ref 80.0–100.0)
Platelets: 253 10*3/uL (ref 150–400)
RBC: 2.82 MIL/uL — ABNORMAL LOW (ref 4.22–5.81)
RDW: 16 % — ABNORMAL HIGH (ref 11.5–15.5)
WBC: 12.4 10*3/uL — ABNORMAL HIGH (ref 4.0–10.5)
nRBC: 0 % (ref 0.0–0.2)

## 2021-12-23 LAB — GLUCOSE, CAPILLARY
Glucose-Capillary: 182 mg/dL — ABNORMAL HIGH (ref 70–99)
Glucose-Capillary: 174 mg/dL — ABNORMAL HIGH (ref 70–99)
Glucose-Capillary: 118 mg/dL — ABNORMAL HIGH (ref 70–99)
Glucose-Capillary: 146 mg/dL — ABNORMAL HIGH (ref 70–99)

## 2021-12-23 LAB — STREP PNEUMONIAE URINARY ANTIGEN: Strep Pneumo Urinary Antigen: NEGATIVE

## 2021-12-23 MED ORDER — GABAPENTIN 300 MG PO CAPS
300.0000 mg | ORAL_CAPSULE | Freq: Two times a day (BID) | ORAL | Status: DC
  Administered 2021-12-23 – 2021-12-24 (×3): 300 mg via ORAL
  Filled 2021-12-23 (×3): qty 1

## 2021-12-23 MED ORDER — NORTRIPTYLINE HCL 25 MG PO CAPS
25.0000 mg | ORAL_CAPSULE | Freq: Every day | ORAL | Status: DC
  Administered 2021-12-23 – 2021-12-30 (×8): 25 mg via ORAL
  Filled 2021-12-23 (×8): qty 1

## 2021-12-23 MED ORDER — FINASTERIDE 5 MG PO TABS
5.0000 mg | ORAL_TABLET | Freq: Every day | ORAL | Status: DC
  Administered 2021-12-23 – 2021-12-30 (×8): 5 mg via ORAL
  Filled 2021-12-23 (×8): qty 1

## 2021-12-23 MED ORDER — METOPROLOL SUCCINATE ER 50 MG PO TB24
50.0000 mg | ORAL_TABLET | Freq: Every day | ORAL | Status: DC
  Administered 2021-12-23 – 2021-12-24 (×2): 50 mg via ORAL
  Filled 2021-12-23 (×2): qty 1

## 2021-12-23 MED ORDER — IPRATROPIUM-ALBUTEROL 0.5-2.5 (3) MG/3ML IN SOLN
3.0000 mL | Freq: Four times a day (QID) | RESPIRATORY_TRACT | Status: DC
  Administered 2021-12-23: 3 mL via RESPIRATORY_TRACT
  Filled 2021-12-23: qty 3

## 2021-12-23 MED ORDER — ESCITALOPRAM OXALATE 20 MG PO TABS
20.0000 mg | ORAL_TABLET | Freq: Every day | ORAL | Status: DC
  Administered 2021-12-23 – 2021-12-30 (×8): 20 mg via ORAL
  Filled 2021-12-23 (×8): qty 1

## 2021-12-23 MED ORDER — ROSUVASTATIN CALCIUM 10 MG PO TABS
10.0000 mg | ORAL_TABLET | Freq: Every day | ORAL | Status: DC
  Administered 2021-12-23 – 2021-12-30 (×8): 10 mg via ORAL
  Filled 2021-12-23 (×8): qty 1

## 2021-12-23 MED ORDER — TAMSULOSIN HCL 0.4 MG PO CAPS
0.4000 mg | ORAL_CAPSULE | Freq: Every day | ORAL | Status: DC
  Administered 2021-12-23 – 2021-12-30 (×8): 0.4 mg via ORAL
  Filled 2021-12-23 (×8): qty 1

## 2021-12-23 MED ORDER — CLOPIDOGREL BISULFATE 75 MG PO TABS
75.0000 mg | ORAL_TABLET | Freq: Every day | ORAL | Status: DC
  Administered 2021-12-23 – 2021-12-30 (×8): 75 mg via ORAL
  Filled 2021-12-23 (×8): qty 1

## 2021-12-23 MED ORDER — PANTOPRAZOLE SODIUM 40 MG PO TBEC
40.0000 mg | DELAYED_RELEASE_TABLET | Freq: Every day | ORAL | Status: DC
  Administered 2021-12-23 – 2021-12-30 (×8): 40 mg via ORAL
  Filled 2021-12-23 (×8): qty 1

## 2021-12-23 MED ORDER — IPRATROPIUM-ALBUTEROL 0.5-2.5 (3) MG/3ML IN SOLN
3.0000 mL | Freq: Three times a day (TID) | RESPIRATORY_TRACT | Status: DC
  Administered 2021-12-24 – 2021-12-25 (×4): 3 mL via RESPIRATORY_TRACT
  Filled 2021-12-23 (×4): qty 3

## 2021-12-23 MED ORDER — SODIUM BICARBONATE 650 MG PO TABS
650.0000 mg | ORAL_TABLET | Freq: Every day | ORAL | Status: DC
  Administered 2021-12-23 – 2021-12-30 (×8): 650 mg via ORAL
  Filled 2021-12-23 (×8): qty 1

## 2021-12-23 NOTE — Progress Notes (Signed)
PROGRESS NOTE    Ryan Jenkins  JKK:[id number] DOB: 08-01-1960 DOA: 12/22/2021 PCP: [name], MD    Brief Narrative:  61 year old male with a history of chronic kidney disease stage IV, diastolic heart failure, recently in the hospital for decompensated CHF and was released last month after significant diuresis, comes to the hospital with cough and shortness of breath.  X-ray shows multifocal pneumonia.  He does appear to have some worsening in renal function.  Started on IV antibiotics.   Assessment & Plan:   Principal Problem:   Multifocal pneumonia Active Problems:   Acute on chronic diastolic CHF (congestive heart failure) (HCC)   Type 2 diabetes mellitus with peripheral neuropathy (HCC)   Essential hypertension   Hyperlipidemia   Gastroparesis   Gastroesophageal reflux disease without esophagitis   Acute renal failure superimposed on stage 4 chronic kidney disease (HCC)   Coronary artery disease   BPH (benign prostatic hyperplasia)   Anemia in chronic kidney disease (CKD)   Multifocal pneumonia -On ceftriaxone and azithromycin -Continue pulmonary hygiene -Encourage flutter valve -Follow-up urinary antigens  Chronic diastolic congestive heart failure -Clinically, the patient does appear to be compensated -Current weight is similar to previous discharge weight when he was released after adequate diuresis.  He does not have significant peripheral edema -Since creatinine has trended up with Lasix, will hold off on further diuresis for now  AKI on CKD stage IV -Baseline creatinine approximately 4 -Creatinine on admission noted to be 4.9 which has bumped up to 5.4 with diuresis -He may be a little low on volume, will hold off on further diuresis -Continue to monitor renal function, if continues to worsen may need to discuss with nephrology -Renal ultrasound negative for obstructive process  Non-anion gap metabolic acidosis -Likely related to renal  failure -Start oral bicarb  Anemia chronic kidney disease -Current hemoglobin is 8.2 -Baseline hemoglobin appears to run between 8-9 -Continue to monitor  Gastroparesis -Continue on Compazine  Insulin-dependent diabetes -Chronically on Lantus, currently on hold -Continue on sliding scale insulin -Overall blood sugars appear to be stable  Hyperlipidemia -Continue on statin  Hypertension -Chronically on amlodipine, hydralazine, metoprolol -Only metoprolol continued on admission -Currently appears to be normotensive -Restart other meds as blood pressure will allow  DVT prophylaxis: heparin injection 5,000 Units Start: 12/22/21 2200  Code Status: Full code Family Communication: Discussed with patient Disposition Plan: Status is: Inpatient Remains inpatient appropriate because: Continued management of renal failure and pneumonia     Consultants:    Procedures:    Antimicrobials:  Ceftriaxone Azithromycin   Subjective: He says he feels unwell.  He does have a bit of a productive cough.  Feels weak.  Objective: Vitals:   12/22/21 2202 12/23/21 0542 12/23/21 1000 12/23/21 1037  BP: (!) 149/83 117/82    Pulse: 97 (!) 103  91  Resp: (!) 26 (!) 21 20   Temp:  99.1 F (37.3 C) 98.6 F (37 C)   TempSrc:  Oral Oral   SpO2: 97% (!) 87%    Weight:      Height:        Intake/Output Summary (Last 24 hours) at 12/23/2021 2057 Last data filed at 12/23/2021 1900 Gross per 24 hour  Intake 240 ml  Output 500 ml  Net -260 ml   Filed Weights   12/22/21 7169  Weight: 93.4 kg    Examination:  General exam: Appears calm and comfortable  Respiratory system: Clear to auscultation. Respiratory effort normal. Cardiovascular system:  S1 & S2 heard, RRR. No JVD, murmurs, rubs, gallops or clicks. No pedal edema. Gastrointestinal system: Abdomen is nondistended, soft and nontender. No organomegaly or masses felt. Normal bowel sounds heard. Central nervous system: Alert and  oriented. No focal neurological deficits. Extremities: Symmetric 5 x 5 power. Skin: No rashes, lesions or ulcers Psychiatry: Judgement and insight appear normal. Mood & affect appropriate.     Data Reviewed: I have personally reviewed following labs and imaging studies  CBC: Recent Labs  Lab 12/22/21 0730 12/23/21 0444  WBC 13.6* 12.4*  NEUTROABS 12.0*  --   HGB 9.1* 8.2*  HCT 28.7* 25.8*  MCV 92.0 91.5  PLT 296 102   Basic Metabolic Panel: Recent Labs  Lab 12/22/21 0730 12/23/21 0444  NA 136 138  K 3.6 3.6  CL 105 107  CO2 19* 18*  GLUCOSE 233* 193*  BUN 58* 66*  CREATININE 4.97* 5.43*  CALCIUM 8.5* 8.5*   GFR: Estimated Creatinine Clearance: 16.6 mL/min (A) (by C-G formula based on SCr of 5.43 mg/dL (H)). Liver Function Tests: Recent Labs  Lab 12/22/21 0730 12/23/21 0444  AST 23 41  ALT 12 26  ALKPHOS 71 119  BILITOT 1.0 1.4*  PROT 8.2* 7.9  ALBUMIN 3.3* 3.2*   Recent Labs  Lab 12/22/21 0730  LIPASE 28   No results for input(s): "AMMONIA" in the last 168 hours. Coagulation Profile: No results for input(s): "INR", "PROTIME" in the last 168 hours. Cardiac Enzymes: No results for input(s): "CKTOTAL", "CKMB", "CKMBINDEX", "TROPONINI" in the last 168 hours. BNP (last 3 results) No results for input(s): "PROBNP" in the last 8760 hours. HbA1C: No results for input(s): "HGBA1C" in the last 72 hours. CBG: Recent Labs  Lab 12/22/21 1701 12/22/21 2118 12/23/21 0727 12/23/21 1149 12/23/21 1617  GLUCAP 244* 294* 182* 174* 118*   Lipid Profile: No results for input(s): "CHOL", "HDL", "LDLCALC", "TRIG", "CHOLHDL", "LDLDIRECT" in the last 72 hours. Thyroid Function Tests: No results for input(s): "TSH", "T4TOTAL", "FREET4", "T3FREE", "THYROIDAB" in the last 72 hours. Anemia Panel: No results for input(s): "VITAMINB12", "FOLATE", "FERRITIN", "TIBC", "IRON", "RETICCTPCT" in the last 72 hours. Sepsis Labs: Recent Labs  Lab 12/22/21 0730  PROCALCITON  0.62    Recent Results (from the past 240 hour(s))  Resp Panel by RT-PCR (Flu A&B, Covid) Anterior Nasal Swab     Status: None   Collection Time: 12/22/21  7:30 AM   Specimen: Anterior Nasal Swab  Result Value Ref Range Status   SARS Coronavirus 2 by RT PCR NEGATIVE NEGATIVE Final    Comment: (NOTE) SARS-CoV-2 target nucleic acids are NOT DETECTED.  The SARS-CoV-2 RNA is generally detectable in upper respiratory specimens during the acute phase of infection. The lowest concentration of SARS-CoV-2 viral copies this assay can detect is 138 copies/mL. A negative result does not preclude SARS-Cov-2 infection and should not be used as the sole basis for treatment or other patient management decisions. A negative result may occur with  improper specimen collection/handling, submission of specimen other than nasopharyngeal swab, presence of viral mutation(s) within the areas targeted by this assay, and inadequate number of viral copies(<138 copies/mL). A negative result must be combined with clinical observations, patient history, and epidemiological information. The expected result is Negative.  Fact Sheet for Patients:  EntrepreneurPulse.com.au  Fact Sheet for Healthcare Providers:  IncredibleEmployment.be  This test is no t yet approved or cleared by the [place] FDA and  has been authorized for detection and/or diagnosis of SARS-CoV-2 by FDA  under an Emergency Use Authorization (EUA). This EUA will remain  in effect (meaning this test can be used) for the duration of the COVID-19 declaration under Section 564(b)(1) of the Act, 21 U.S.C.section 360bbb-3(b)(1), unless the authorization is terminated  or revoked sooner.       Influenza A by PCR NEGATIVE NEGATIVE Final   Influenza B by PCR NEGATIVE NEGATIVE Final    Comment: (NOTE) The Xpert Xpress SARS-CoV-2/FLU/RSV plus assay is intended as an aid in the diagnosis of influenza from  Nasopharyngeal swab specimens and should not be used as a sole basis for treatment. Nasal washings and aspirates are unacceptable for Xpert Xpress SARS-CoV-2/FLU/RSV testing.  Fact Sheet for Patients: EntrepreneurPulse.com.au  Fact Sheet for Healthcare Providers: IncredibleEmployment.be  This test is not yet approved or cleared by the [place] FDA and has been authorized for detection and/or diagnosis of SARS-CoV-2 by FDA under an Emergency Use Authorization (EUA). This EUA will remain in effect (meaning this test can be used) for the duration of the COVID-19 declaration under Section 564(b)(1) of the Act, 21 U.S.C. section 360bbb-3(b)(1), unless the authorization is terminated or revoked.  Performed at [facility], [place] [address], [place], [place] [address]          Radiology Studies: ECHOCARDIOGRAM COMPLETE  Result Date: 12/23/2021    ECHOCARDIOGRAM REPORT   Patient Name:   Ryan Jenkins Date of Exam: 12/23/2021 Medical Rec #:  [id number]         Height:       74.0 in Accession #:    [id number]        Weight:       206.0 lb Date of Birth:  Aug 05, 1960         BSA:          2.201 m Patient Age:    45 years          BP:           142/77 mmHg Patient Gender: M                 HR:           91 bpm. Exam Location:  Inpatient Procedure: 2D Echo, Cardiac Doppler and Color Doppler Indications:    I37.04 Acute diastolic (congestive) heart failure  History:        Patient has prior history of Echocardiogram examinations, most                 recent 11/26/2021. CHF, CAD; Risk Factors:Diabetes, Dyslipidemia                 and Hypertension. CKD.  Sonographer:    Eartha Inch Referring Phys: [id number] Jonnie Finner  Sonographer Comments: Image acquisition challenging due to patient body habitus and Image acquisition challenging due to respiratory motion. IMPRESSIONS  1. Left ventricular ejection fraction, by estimation, is 50 to 55%.  The left ventricle has low normal function. The left ventricle demonstrates global hypokinesis. There is moderate concentric left ventricular hypertrophy. Left ventricular diastolic parameters are consistent with Grade II diastolic dysfunction (pseudonormalization).  2. Right ventricular systolic function is mildly reduced. The right ventricular size is mildly enlarged. Tricuspid regurgitation signal is inadequate for assessing PA pressure.  3. Left atrial size was mildly dilated.  4. The mitral valve is grossly normal. Trivial mitral valve regurgitation. No evidence of mitral stenosis.  5. The aortic valve is tricuspid. Aortic valve regurgitation is not visualized. Aortic valve sclerosis is  present, with no evidence of aortic valve stenosis.  6. The inferior vena cava is dilated in size with >50% respiratory variability, suggesting right atrial pressure of 8 mmHg. Comparison(s): LVEF 50-55% on this study, slightly reduced from 55-60. FINDINGS  Left Ventricle: Left ventricular ejection fraction, by estimation, is 50 to 55%. The left ventricle has low normal function. The left ventricle demonstrates global hypokinesis. The left ventricular internal cavity size was normal in size. There is moderate concentric left ventricular hypertrophy. Left ventricular diastolic parameters are consistent with Grade II diastolic dysfunction (pseudonormalization). Right Ventricle: The right ventricular size is mildly enlarged. No increase in right ventricular wall thickness. Right ventricular systolic function is mildly reduced. Tricuspid regurgitation signal is inadequate for assessing PA pressure. Left Atrium: Left atrial size was mildly dilated. Right Atrium: Right atrial size was normal in size. Pericardium: Trivial pericardial effusion is present. Mitral Valve: The mitral valve is grossly normal. Trivial mitral valve regurgitation. No evidence of mitral valve stenosis. MV peak gradient, 6.7 mmHg. The mean mitral valve gradient  is 3.0 mmHg. Tricuspid Valve: The tricuspid valve is grossly normal. Tricuspid valve regurgitation is trivial. No evidence of tricuspid stenosis. Aortic Valve: The aortic valve is tricuspid. Aortic valve regurgitation is not visualized. Aortic valve sclerosis is present, with no evidence of aortic valve stenosis. Pulmonic Valve: The pulmonic valve was grossly normal. Pulmonic valve regurgitation is trivial. No evidence of pulmonic stenosis. Aorta: The aortic root and ascending aorta are structurally normal, with no evidence of dilitation. Venous: The inferior vena cava is dilated in size with greater than 50% respiratory variability, suggesting right atrial pressure of 8 mmHg. IAS/Shunts: The atrial septum is grossly normal.  LEFT VENTRICLE PLAX 2D LVIDd:         3.80 cm      Diastology LVIDs:         3.00 cm      LV e' medial:    4.87 cm/s LV PW:         1.60 cm      LV E/e' medial:  24.8 LV IVS:        1.60 cm      LV e' lateral:   10.80 cm/s LVOT diam:     2.30 cm      LV E/e' lateral: 11.2 LV SV:         64 LV SV Index:   29 LVOT Area:     4.15 cm  LV Volumes (MOD) LV vol d, MOD A2C: 153.0 ml LV vol d, MOD A4C: 198.0 ml LV vol s, MOD A2C: 88.8 ml LV vol s, MOD A4C: 125.0 ml LV SV MOD A2C:     64.2 ml LV SV MOD A4C:     198.0 ml LV SV MOD BP:      62.0 ml RIGHT VENTRICLE            IVC RV S prime:     8.56 cm/s  IVC diam: 2.10 cm TAPSE (M-mode): 1.4 cm LEFT ATRIUM             Index        RIGHT ATRIUM           Index LA diam:        4.70 cm 2.14 cm/m   RA Area:     15.10 cm LA Vol (A2C):   85.6 ml 38.89 ml/m  RA Volume:   35.50 ml  16.13 ml/m LA Vol (A4C):   73.1 ml 33.21  ml/m LA Biplane Vol: 79.3 ml 36.03 ml/m  AORTIC VALVE LVOT Vmax:   80.10 cm/s LVOT Vmean:  61.600 cm/s LVOT VTI:    0.153 m  AORTA Ao Root diam: 2.80 cm Ao Asc diam:  3.40 cm MITRAL VALVE MV Area (PHT): 6.83 cm     SHUNTS MV Area VTI:   2.41 cm     Systemic VTI:  0.15 m MV Peak grad:  6.7 mmHg     Systemic Diam: 2.30 cm MV Mean grad:   3.0 mmHg MV Vmax:       1.29 m/s MV Vmean:      80.0 cm/s MV Decel Time: 111 msec MV E velocity: 121.00 cm/s MV A velocity: 62.60 cm/s MV E/A ratio:  1.93 Eleonore Chiquito MD Electronically signed by Eleonore Chiquito MD Signature Date/Time: 12/23/2021/11:33:37 AM    Final    DG Chest 2 View  Result Date: 12/22/2021 CLINICAL DATA:  61 year old male with shortness of breath, abdominal pain, malaise. EXAM: CHEST - 2 VIEW COMPARISON:  Chest radiographs 11/25/2021 and earlier. FINDINGS: Semi upright AP and lateral views of the chest at 0708 hours. New widespread although indistinct right lung opacity, more conspicuous in the lower lobe on the lateral view. Possible early lung consolidation, no definite air bronchograms. Stable somewhat low lung volumes from last month. No pleural effusion. Mediastinal contours stable and within normal limits. Visualized tracheal air column is within normal limits. Left lung appears to remain negative. No acute osseous abnormality identified. Negative visible bowel gas. IMPRESSION: New widespread although indistinct right lung opacity, somewhat confluent in the lower lobe on the lateral view and suspicious for multilobar Pneumonia in this setting. No pleural effusion. If there are clinical signs/symptoms of infection then Followup PA and lateral chest X-ray is recommended in 3-4 weeks following trial of antibiotic therapy to ensure resolution and exclude underlying malignancy. Electronically Signed   By: Genevie Ann M.D.   On: 12/22/2021 07:31        Scheduled Meds:  clopidogrel  75 mg Oral Daily   escitalopram  20 mg Oral Daily   finasteride  5 mg Oral Daily   gabapentin  300 mg Oral BID   guaiFENesin  600 mg Oral BID   heparin  5,000 Units Subcutaneous Q8H   insulin aspart  0-5 Units Subcutaneous QHS   insulin aspart  0-9 Units Subcutaneous TID WC   metoprolol succinate  50 mg Oral Daily   nortriptyline  25 mg Oral Daily   pantoprazole  40 mg Oral Daily   rosuvastatin  10 mg  Oral Daily   sodium bicarbonate  650 mg Oral Daily   tamsulosin  0.4 mg Oral Daily   Continuous Infusions:  azithromycin 500 mg (12/23/21 1144)   cefTRIAXone (ROCEPHIN)  IV 1 g (12/23/21 1057)     LOS: 1 day    Time spent: 35 minutes    Kathie Dike, MD [facility]   If 7PM-7AM, please contact night-coverage [email or web address]  12/23/2021, 8:57 PM

## 2021-12-23 NOTE — TOC Initial Note (Signed)
Transition of Care [facility]) - Initial/Assessment Note    Patient Details  Name: Ryan Jenkins MRN: [id number] Date of Birth: [birth year]  Transition of Care [facility]) CM/SW Contact:    Leeroy Cha, RN Phone Number: 12/23/2021, 3:21 PM  Clinical Narrative:                  Transition of Care [facility]) Screening Note   Patient Details  Name: Ryan Jenkins Date of Birth: Jun 03, 1960   Transition of Care [facility]) CM/SW Contact:    Leeroy Cha, RN Phone Number: 12/23/2021, 3:22 PM    Transition of Care Department [facility]) has reviewed patient and no TOC needs have been identified at this time. We will continue to monitor patient advancement through interdisciplinary progression rounds. If new patient transition needs arise, please place a TOC consult.    Expected Discharge Plan: Home/Self Care Barriers to Discharge: Continued Medical Work up   Patient Goals and CMS Choice Patient states their goals for this hospitalization and ongoing recovery are:: to go home      Expected Discharge Plan and Services Expected Discharge Plan: Home/Self Care   Discharge Planning Services: CM Consult   Living arrangements for the past 2 months: Apartment                                      Prior Living Arrangements/Services Living arrangements for the past 2 months: Apartment Lives with:: Self Patient language and need for interpreter reviewed:: Yes Do you feel safe going back to the place where you live?: Yes            Criminal Activity/Legal Involvement Pertinent to Current Situation/Hospitalization: No - Comment as needed  Activities of Daily Living      Permission Sought/Granted                  Emotional Assessment Appearance:: Appears stated age Attitude/Demeanor/Rapport: Engaged Affect (typically observed): Calm Orientation: : Oriented to Self, Oriented to Place, Oriented to  Time, Oriented to Situation Alcohol / Substance Use: Not  Applicable Psych Involvement: No (comment)  Admission diagnosis:  Multifocal pneumonia [J18.9] Dyspnea, unspecified type [R06.00] Patient Active Problem List   Diagnosis Date Noted   Multifocal pneumonia 12/22/2021   CHF exacerbation ([place]) 10/19/2021   Hypokalemia 10/18/2021   Anemia in chronic kidney disease (CKD) 10/18/2021   BPH (benign prostatic hyperplasia) 10/16/2021   Severe sepsis ([place]) 06/05/2021   Coronary artery disease 02/07/2021   Acute renal failure superimposed on stage 4 chronic kidney disease ([place])    CKD (chronic kidney disease) stage 4, GFR 15-29 ml/min ([place]) 07/07/2020   Acute on chronic diastolic CHF (congestive heart failure) ([place]) 07/07/2020   Erectile dysfunction associated with type 2 diabetes mellitus ([place]) 08/07/2019   Vitreous hemorrhage of left eye ([place]) 07/22/2019   Vision loss of left eye 07/22/2019   History of medication noncompliance 04/02/2019   Gastric ulcer without hemorrhage or perforation 12/25/2018   Gastroesophageal reflux disease without esophagitis 09/04/2018   Diabetic retinopathy of both eyes associated with type 2 diabetes mellitus ([place]) 01/03/2018   Intermittent diarrhea 09/27/2017   Gastroparesis 09/27/2017   Macroalbuminuric diabetic nephropathy ([place]) 06/25/2017   History of falling 06/25/2017   Hyperlipidemia 03/21/2017   Vitamin B 12 deficiency 03/21/2017   Type 2 diabetes mellitus with peripheral neuropathy ([place]) 02/05/2017   Essential hypertension 02/05/2017   Depression 02/05/2017   Unintended  weight loss 02/05/2017   Gait disturbance 02/05/2017   Pronation deformity of both feet 05/11/2014   Diabetic neuropathy, type II diabetes mellitus ([place]) 05/11/2014   Metatarsal deformity 05/11/2014   PCP:  [name], MD Pharmacy:   [facility] [place], [address] AT [place] [address] Phone: [phone] Fax:  [phone]  [id number] [place] [id number]: [phone]: [phone]    Social Determinants of Health (SDOH) Interventions    Readmission Risk Interventions   Row Labels 11/29/2021    4:16 PM  Readmission Risk Prevention Plan   Section Header. No data exists in this row.   Transportation Screening   Complete  Medication Review (Press photographer   Complete  HRI or Home Care Consult   Complete  SW Recovery Care/Counseling Consult   Complete  Palliative Care Screening   [place]  Not Applicable

## 2021-12-23 NOTE — Progress Notes (Signed)
  Echocardiogram 2D Echocardiogram has been performed.  Ryan Jenkins 12/23/2021, 9:57 AM

## 2021-12-24 DIAGNOSIS — I5033 Acute on chronic diastolic (congestive) heart failure: Secondary | ICD-10-CM | POA: Diagnosis not present

## 2021-12-24 DIAGNOSIS — N179 Acute kidney failure, unspecified: Secondary | ICD-10-CM | POA: Diagnosis not present

## 2021-12-24 DIAGNOSIS — N184 Chronic kidney disease, stage 4 (severe): Secondary | ICD-10-CM | POA: Diagnosis not present

## 2021-12-24 DIAGNOSIS — J189 Pneumonia, unspecified organism: Secondary | ICD-10-CM | POA: Diagnosis not present

## 2021-12-24 LAB — BASIC METABOLIC PANEL
Anion gap: 13 (ref 5–15)
BUN: 79 mg/dL — ABNORMAL HIGH (ref 8–23)
CO2: 19 mmol/L — ABNORMAL LOW (ref 22–32)
Calcium: 8.2 mg/dL — ABNORMAL LOW (ref 8.9–10.3)
Chloride: 105 mmol/L (ref 98–111)
Creatinine, Ser: 6.66 mg/dL — ABNORMAL HIGH (ref 0.61–1.24)
GFR, Estimated: 9 mL/min — ABNORMAL LOW (ref >60)
Glucose, Bld: 165 mg/dL — ABNORMAL HIGH (ref 70–99)
Potassium: 3.9 mmol/L (ref 3.5–5.1)
Sodium: 137 mmol/L (ref 135–145)

## 2021-12-24 LAB — CBC
HCT: 21.4 % — ABNORMAL LOW (ref 39.0–52.0)
Hemoglobin: 6.7 g/dL — CL (ref 13.0–17.0)
MCH: 29.1 pg (ref 26.0–34.0)
MCHC: 31.3 g/dL (ref 30.0–36.0)
MCV: 93 fL (ref 80.0–100.0)
Platelets: 219 10*3/uL (ref 150–400)
RBC: 2.3 MIL/uL — ABNORMAL LOW (ref 4.22–5.81)
RDW: 16.3 % — ABNORMAL HIGH (ref 11.5–15.5)
WBC: 5.8 10*3/uL (ref 4.0–10.5)
nRBC: 0 % (ref 0.0–0.2)

## 2021-12-24 LAB — CREATININE, URINE, RANDOM: Creatinine, Urine: 208 mg/dL

## 2021-12-24 LAB — SODIUM, URINE, RANDOM: Sodium, Ur: 22 mmol/L

## 2021-12-24 LAB — GLUCOSE, CAPILLARY
Glucose-Capillary: 141 mg/dL — ABNORMAL HIGH (ref 70–99)
Glucose-Capillary: 166 mg/dL — ABNORMAL HIGH (ref 70–99)
Glucose-Capillary: 187 mg/dL — ABNORMAL HIGH (ref 70–99)
Glucose-Capillary: 181 mg/dL — ABNORMAL HIGH (ref 70–99)

## 2021-12-24 LAB — HEMOGLOBIN AND HEMATOCRIT, BLOOD
HCT: 24.4 % — ABNORMAL LOW (ref 39.0–52.0)
Hemoglobin: 7.7 g/dL — ABNORMAL LOW (ref 13.0–17.0)

## 2021-12-24 LAB — ABO/RH: ABO/RH(D): B POS

## 2021-12-24 LAB — PREPARE RBC (CROSSMATCH)

## 2021-12-24 MED ORDER — SODIUM CHLORIDE 0.9 % IV SOLN: INTRAVENOUS | Status: DC | PRN

## 2021-12-24 MED ORDER — GABAPENTIN 300 MG PO CAPS
300.0000 mg | ORAL_CAPSULE | Freq: Every day | ORAL | Status: DC
  Administered 2021-12-25 – 2021-12-29 (×5): 300 mg via ORAL
  Filled 2021-12-24 (×5): qty 1

## 2021-12-24 MED ORDER — SODIUM CHLORIDE 0.9% IV SOLUTION: Freq: Once | INTRAVENOUS | Status: AC

## 2021-12-24 NOTE — Consult Note (Addendum)
[facility]  INPATIENT CONSULTATION  Reason for Consultation: AKI on CKD Requesting Provider: Dr. Roderic Palau  HPI: Ryan Jenkins is an 61 y.o. male with anemia, anixety, CAD, combined CHF, CKD IV (f/b Dr. Cira Servant), depression, DM2, HL, HTN, sp TURP 10/2021 who is seen for evaluation and management of AKI on CKD.   Presented [facility] 12/8 with abdominal pain of several day duration, body aches, sinus symptoms and productive cough. CXR with R lung opacity. COVID and flu neg. WBC 13.6. Admitted for CAP tx CTX and azithromycin.  Baseline Cr ~4 was 5 on presentation and has trended up to 5.4 and 6.7 today. Hb typically in the 9s - trended down to 6.7 today. Denies bleeding.  pRBC planned for today.   Supplemental O2 weaned from 11L HF to 5L today.  Says po intake is limited.  Felt dry.  Not drinking much. No LUTs.    I/Os yest 240 / 550.  UOP day prior 600.  Net neg 327m for admit. Wts ordered but not recorded.  No contrast, no NSAIDs.    Renal [place] admission R 10 L 11.4 ^ echo, no obstruction.  Admitted 10/2021 x2 with CHF, also had UTI 2nd time. Diuresed and improved.      PMH: Past Medical History:  Diagnosis Date   Anemia    Anginal pain ([place]    Anxiety    Arthritis    BPH with obstruction/lower urinary tract symptoms    CAD (coronary artery disease)    cardiologist--- dr mJunius Roadsbadal;  [place] in [place]  nonobstructive cad involing pRCA 60% (done in setting worseing CHF/ acute pulmonary edema requiring intubation/ AKI   Chronic combined systolic and diastolic CHF (congestive heart failure) ([place]    Chronic kidney disease, stage IV (severe) ([place]    nephrologist--- dr bCarolin Sicks  Depression    Diabetic neuropathy ([place]    Dyspnea    Edema of both lower extremities    GERD (gastroesophageal reflux disease)    Heart murmur    History of community acquired pneumonia    ad[facility]  w/ ARF hypoxia w/  severe sepsis   Hyperlipidemia    Hypertension    Insulin dependent type 2 diabetes mellitus ([place]    Pneumonia    Retinopathy due to secondary diabetes ([place]    Uses walker    Vitamin B12 deficiency    Vitreous hemorrhage (HCC)    PSH: Past Surgical History:  Procedure Laterality Date   CARDIAC CATHETERIZATION  11/06/2019   [place]    nonobstructive CAD , pRCA 60% (result in care everywhere)   CATARACT EXTRACTION W/ INTRAOCULAR LENS IMPLANT Bilateral 2017   TRANSURETHRAL RESECTION OF PROSTATE N/A 10/16/2021   Procedure: TRANSURETHRAL RESECTION OF THE PROSTATE (TURP);  Surgeon: GJanith Lima MD;  Location: [facility];  Service: Urology;  Laterality: N/A;    Past Medical History:  Diagnosis Date   Anemia    Anginal pain ([place]    Anxiety    Arthritis    BPH with obstruction/lower urinary tract symptoms    CAD (coronary artery disease)    cardiologist--- dr mJunius Roadsbadal;  11[place]06[place] in [place])  nonobstructive cad involing pRCA 60% (done in setting worseing CHF/ acute pulmonary edema requiring intubation/ AKI   Chronic combined systolic and diastolic CHF (congestive heart failure) ([place]    Chronic kidney disease, stage IV (severe) ([place]  nephrologist--- dr Carolin Sicks   Depression    Diabetic neuropathy ([place])    Dyspnea    Edema of both lower extremities    GERD (gastroesophageal reflux disease)    Heart murmur    History of community acquired pneumonia    ad[facility]04-2021 [facility]  w/ ARF hypoxia w/ severe sepsis   Hyperlipidemia    Hypertension    Insulin dependent type 2 diabetes mellitus ([place])    Pneumonia    Retinopathy due to secondary diabetes ([place])    Uses walker    Vitamin B12 deficiency    Vitreous hemorrhage (HCC)     Medications:  I have reviewed the patient's current medications.  Medications Prior to Admission  Medication Sig Dispense Refill   acetaminophen (TYLENOL) 500 MG tablet  Take 1,000 mg by mouth as needed for mild pain or headache.     amLODipine (NORVASC) 10 MG tablet Take 1 tablet (10 mg total) by mouth daily. 30 tablet 6   Cholecalciferol (VITAMIN D3) 1.25 MG (50000 UT) CAPS Take 50,000 Units by mouth once a week.     clopidogrel (PLAVIX) 75 MG tablet Take 1 tablet (75 mg total) by mouth daily. 30 tablet 1   escitalopram (LEXAPRO) 20 MG tablet Take 1 tablet (20 mg total) by mouth daily. 30 tablet 3   famotidine (PEPCID) 40 MG tablet Take 40 mg by mouth daily.     finasteride (PROSCAR) 5 MG tablet Take 1 tablet (5 mg total) by mouth daily. 30 tablet 2   furosemide (LASIX) 40 MG tablet Take 1.5 tablets (60 mg total) by mouth 2 (two) times daily. (Patient taking differently: Take 40 mg by mouth 2 (two) times daily.) 90 tablet 3   gabapentin (NEURONTIN) 300 MG capsule Take 1 capsule (300 mg total) by mouth daily as needed (neuropathic pain). (Patient taking differently: Take 300 mg by mouth 3 (three) times daily.) 30 capsule 0   hydrALAZINE (APRESOLINE) 100 MG tablet Take 1 tablet (100 mg total) by mouth 3 (three) times daily. (Patient taking differently: Take 100 mg by mouth 2 (two) times daily.) 90 tablet 6   LANTUS SOLOSTAR 100 UNIT/ML Solostar Pen Inject 22 Units into the skin at bedtime. (Patient taking differently: Inject 40 Units into the skin at bedtime.) 15 mL 2   meclizine (ANTIVERT) 25 MG tablet Take 25 mg by mouth 2 (two) times daily as needed for dizziness.     metoprolol succinate (TOPROL-XL) 50 MG 24 hr tablet Take 1 tablet (50 mg total) by mouth at bedtime. Take with or immediately following a meal. (Patient taking differently: Take 50 mg by mouth daily. Take with or immediately following a meal.) 30 tablet 6   nortriptyline (PAMELOR) 25 MG capsule Take 1 capsule (25 mg total) by mouth at bedtime. (Patient taking differently: Take 25 mg by mouth daily.) 30 capsule 2   pantoprazole (PROTONIX) 40 MG tablet Take 1 tablet (40 mg total) by mouth daily. 30  tablet 6   potassium chloride (KLOR-CON M) 10 MEQ tablet Take 1 tablet (10 mEq total) by mouth daily. 30 tablet 3   rosuvastatin (CRESTOR) 10 MG tablet Take 10 mg by mouth daily.     sodium bicarbonate 650 MG tablet Take 650 mg by mouth daily.     tamsulosin (FLOMAX) 0.4 MG CAPS capsule Take 0.4 mg by mouth daily.     atorvastatin (LIPITOR) 40 MG tablet Take 1 tablet (40 mg total) by mouth daily. (Patient not taking: Reported on 12/22/2021)  30 tablet 6   oxyCODONE-acetaminophen (PERCOCET) 5-325 MG tablet Take 1 tablet by mouth every 4 (four) hours as needed for up to 18 doses for severe pain. (Patient not taking: Reported on 12/22/2021) 18 tablet 0    ALLERGIES:   Allergies  Allergen Reactions   Claritin [Loratadine] Swelling    Joint swelling    Hydrochlorothiazide Other (See Comments)    Dizziness   Latex Hives   Lyrica [Pregabalin] Other (See Comments)    Depression. "Makes me loopy"    Metformin And Related Nausea And Vomiting    FAM HX: Family History  Problem Relation Age of Onset   Renal cancer Mother    Hypertension Mother    Pancreatic cancer Mother    Hypertension Sister    Stroke Sister    Leukemia Maternal Uncle    Sickle cell trait Maternal Aunt    Colon cancer Neg Hx    Esophageal cancer Neg Hx    Rectal cancer Neg Hx    Stomach cancer Neg Hx     Social History:   reports that he has never smoked. He has never used smokeless tobacco. He reports that he does not drink alcohol and does not use drugs.  ROS: 12 system ROS neg except per HPI   Blood pressure 123/70, pulse 70, temperature 98.7 F (37.1 C), temperature source Axillary, resp. rate 16, height '6\' 2"'$  (1.88 m), weight 93.4 kg, SpO2 100 %. PHYSICAL EXAM: Gen: nontoxic appearing  Eyes: anicteric ENT: MM tacky - says had been very dry when he first presented Neck: no JVD CV:  RRR, no rub Abd:  soft, nontender Lungs: basilar rhonchi, Dec BS R base, normal WOB, no wheezing GU: no foley Extr: no  edema Neuro: nonfocal   Results for orders placed or performed during the hospital encounter of 12/22/21 (from the past 48 hour(s))  Glucose, capillary     Status: Abnormal   Collection Time: 12/22/21  5:01 PM  Result Value Ref Range   Glucose-Capillary 244 (H) 70 - 99 mg/dL    Comment: Glucose reference range applies only to samples taken after fasting for at least 8 hours.  Glucose, capillary     Status: Abnormal   Collection Time: 12/22/21  9:18 PM  Result Value Ref Range   Glucose-Capillary 294 (H) 70 - 99 mg/dL    Comment: Glucose reference range applies only to samples taken after fasting for at least 8 hours.  Comprehensive metabolic panel     Status: Abnormal   Collection Time: 12/23/21  4:44 AM  Result Value Ref Range   Sodium 138 135 - 145 mmol/L   Potassium 3.6 3.5 - 5.1 mmol/L   Chloride 107 98 - 111 mmol/L   CO2 18 (L) 22 - 32 mmol/L   Glucose, Bld 193 (H) 70 - 99 mg/dL    Comment: Glucose reference range applies only to samples taken after fasting for at least 8 hours.   BUN 66 (H) 8 - 23 mg/dL   Creatinine, Ser 5.43 (H) 0.61 - 1.24 mg/dL   Calcium 8.5 (L) 8.9 - 10.3 mg/dL   Total Protein 7.9 6.5 - 8.1 g/dL   Albumin 3.2 (L) 3.5 - 5.0 g/dL   AST 41 15 - 41 U/L   ALT 26 0 - 44 U/L   Alkaline Phosphatase 119 38 - 126 U/L   Total Bilirubin 1.4 (H) 0.3 - 1.2 mg/dL   GFR, Estimated 11 (L) >60 mL/min    Comment: (NOTE) Calculated  using the CKD-EPI Creatinine Equation (2021)    Anion gap 13 5 - 15    Comment: Performed at [facility], [place] [address], [place], [place] [address]  CBC     Status: Abnormal   Collection Time: 12/23/21  4:44 AM  Result Value Ref Range   WBC 12.4 (H) 4.0 - 10.5 K/uL   RBC 2.82 (L) 4.22 - 5.81 MIL/uL   Hemoglobin 8.2 (L) 13.0 - 17.0 g/dL   HCT 25.8 (L) 39.0 - 52.0 %   MCV 91.5 80.0 - 100.0 fL   MCH 29.1 26.0 - 34.0 pg   MCHC 31.8 30.0 - 36.0 g/dL   RDW 16.0 (H) 11.5 - 15.5 %   Platelets 253 150 - 400 K/uL   nRBC  0.0 0.0 - 0.2 %    Comment: Performed at [facility], [place] [address], [place], [place] [address]  Glucose, capillary     Status: Abnormal   Collection Time: 12/23/21  7:27 AM  Result Value Ref Range   Glucose-Capillary 182 (H) 70 - 99 mg/dL    Comment: Glucose reference range applies only to samples taken after fasting for at least 8 hours.  Glucose, capillary     Status: Abnormal   Collection Time: 12/23/21 11:49 AM  Result Value Ref Range   Glucose-Capillary 174 (H) 70 - 99 mg/dL    Comment: Glucose reference range applies only to samples taken after fasting for at least 8 hours.  Glucose, capillary     Status: Abnormal   Collection Time: 12/23/21  4:17 PM  Result Value Ref Range   Glucose-Capillary 118 (H) 70 - 99 mg/dL    Comment: Glucose reference range applies only to samples taken after fasting for at least 8 hours.  Glucose, capillary     Status: Abnormal   Collection Time: 12/23/21  9:51 PM  Result Value Ref Range   Glucose-Capillary 146 (H) 70 - 99 mg/dL    Comment: Glucose reference range applies only to samples taken after fasting for at least 8 hours.  Glucose, capillary     Status: Abnormal   Collection Time: 12/24/21  7:58 AM  Result Value Ref Range   Glucose-Capillary 141 (H) 70 - 99 mg/dL    Comment: Glucose reference range applies only to samples taken after fasting for at least 8 hours.  CBC     Status: Abnormal   Collection Time: 12/24/21 10:05 AM  Result Value Ref Range   WBC 5.8 4.0 - 10.5 K/uL   RBC 2.30 (L) 4.22 - 5.81 MIL/uL   Hemoglobin 6.7 (LL) 13.0 - 17.0 g/dL    Comment: This critical result has verified and been called to [facility], I RN by Sheryn Bison on 12 10 2023 at 1027, and has been read back.    HCT 21.4 (L) 39.0 - 52.0 %   MCV 93.0 80.0 - 100.0 fL   MCH 29.1 26.0 - 34.0 pg   MCHC 31.3 30.0 - 36.0 g/dL   RDW 16.3 (H) 11.5 - 15.5 %   Platelets 219 150 - 400 K/uL   nRBC 0.0 0.0 - 0.2 %    Comment: Performed at [facility], [place] [address] [place], [place] [address]  Basic metabolic panel     Status: Abnormal   Collection Time: 12/24/21 10:05 AM  Result Value Ref Range   Sodium 137 135 - 145 mmol/L   Potassium 3.9 3.5 - 5.1 mmol/L   Chloride 105 98 -  111 mmol/L   CO2 19 (L) 22 - 32 mmol/L   Glucose, Bld 165 (H) 70 - 99 mg/dL    Comment: Glucose reference range applies only to samples taken after fasting for at least 8 hours.   BUN 79 (H) 8 - 23 mg/dL   Creatinine, Ser 6.66 (H) 0.61 - 1.24 mg/dL   Calcium 8.2 (L) 8.9 - 10.3 mg/dL   GFR, Estimated 9 (L) >60 mL/min    Comment: (NOTE) Calculated using the CKD-EPI Creatinine Equation (2021)    Anion gap 13 5 - 15    Comment: Performed at [facility], [place] [address], [place], [place] [address]  Glucose, capillary     Status: Abnormal   Collection Time: 12/24/21 11:30 AM  Result Value Ref Range   Glucose-Capillary 166 (H) 70 - 99 mg/dL    Comment: Glucose reference range applies only to samples taken after fasting for at least 8 hours.    ECHOCARDIOGRAM COMPLETE  Result Date: 12/23/2021    ECHOCARDIOGRAM REPORT   Patient Name:   Ryan Jenkins Date of Exam: 12/23/2021 Medical Rec #:  [id number]         Height:       74.0 in Accession #:    [id number]        Weight:       206.0 lb Date of Birth:  1960-03-19         BSA:          2.201 m Patient Age:    16 years          BP:           142/77 mmHg Patient Gender: M                 HR:           91 bpm. Exam Location:  Inpatient Procedure: 2D Echo, Cardiac Doppler and Color Doppler Indications:    C14.48 Acute diastolic (congestive) heart failure  History:        Patient has prior history of Echocardiogram examinations, most                 recent 11/26/2021. CHF, CAD; Risk Factors:Diabetes, Dyslipidemia                 and Hypertension. CKD.  Sonographer:    Eartha Inch Referring Phys: [id number] Jonnie Finner  Sonographer Comments: Image acquisition challenging due to  patient body habitus and Image acquisition challenging due to respiratory motion. IMPRESSIONS  1. Left ventricular ejection fraction, by estimation, is 50 to 55%. The left ventricle has low normal function. The left ventricle demonstrates global hypokinesis. There is moderate concentric left ventricular hypertrophy. Left ventricular diastolic parameters are consistent with Grade II diastolic dysfunction (pseudonormalization).  2. Right ventricular systolic function is mildly reduced. The right ventricular size is mildly enlarged. Tricuspid regurgitation signal is inadequate for assessing PA pressure.  3. Left atrial size was mildly dilated.  4. The mitral valve is grossly normal. Trivial mitral valve regurgitation. No evidence of mitral stenosis.  5. The aortic valve is tricuspid. Aortic valve regurgitation is not visualized. Aortic valve sclerosis is present, with no evidence of aortic valve stenosis.  6. The inferior vena cava is dilated in size with >50% respiratory variability, suggesting right atrial pressure of 8 mmHg. Comparison(s): LVEF 50-55% on this study, slightly reduced from 55-60. FINDINGS  Left Ventricle: Left ventricular ejection fraction, by estimation, is 50 to 55%. The  left ventricle has low normal function. The left ventricle demonstrates global hypokinesis. The left ventricular internal cavity size was normal in size. There is moderate concentric left ventricular hypertrophy. Left ventricular diastolic parameters are consistent with Grade II diastolic dysfunction (pseudonormalization). Right Ventricle: The right ventricular size is mildly enlarged. No increase in right ventricular wall thickness. Right ventricular systolic function is mildly reduced. Tricuspid regurgitation signal is inadequate for assessing PA pressure. Left Atrium: Left atrial size was mildly dilated. Right Atrium: Right atrial size was normal in size. Pericardium: Trivial pericardial effusion is present. Mitral Valve: The  mitral valve is grossly normal. Trivial mitral valve regurgitation. No evidence of mitral valve stenosis. MV peak gradient, 6.7 mmHg. The mean mitral valve gradient is 3.0 mmHg. Tricuspid Valve: The tricuspid valve is grossly normal. Tricuspid valve regurgitation is trivial. No evidence of tricuspid stenosis. Aortic Valve: The aortic valve is tricuspid. Aortic valve regurgitation is not visualized. Aortic valve sclerosis is present, with no evidence of aortic valve stenosis. Pulmonic Valve: The pulmonic valve was grossly normal. Pulmonic valve regurgitation is trivial. No evidence of pulmonic stenosis. Aorta: The aortic root and ascending aorta are structurally normal, with no evidence of dilitation. Venous: The inferior vena cava is dilated in size with greater than 50% respiratory variability, suggesting right atrial pressure of 8 mmHg. IAS/Shunts: The atrial septum is grossly normal.  LEFT VENTRICLE PLAX 2D LVIDd:         3.80 cm      Diastology LVIDs:         3.00 cm      LV e' medial:    4.87 cm/s LV PW:         1.60 cm      LV E/e' medial:  24.8 LV IVS:        1.60 cm      LV e' lateral:   10.80 cm/s LVOT diam:     2.30 cm      LV E/e' lateral: 11.2 LV SV:         64 LV SV Index:   29 LVOT Area:     4.15 cm  LV Volumes (MOD) LV vol d, MOD A2C: 153.0 ml LV vol d, MOD A4C: 198.0 ml LV vol s, MOD A2C: 88.8 ml LV vol s, MOD A4C: 125.0 ml LV SV MOD A2C:     64.2 ml LV SV MOD A4C:     198.0 ml LV SV MOD BP:      62.0 ml RIGHT VENTRICLE            IVC RV S prime:     8.56 cm/s  IVC diam: 2.10 cm TAPSE (M-mode): 1.4 cm LEFT ATRIUM             Index        RIGHT ATRIUM           Index LA diam:        4.70 cm 2.14 cm/m   RA Area:     15.10 cm LA Vol (A2C):   85.6 ml 38.89 ml/m  RA Volume:   35.50 ml  16.13 ml/m LA Vol (A4C):   73.1 ml 33.21 ml/m LA Biplane Vol: 79.3 ml 36.03 ml/m  AORTIC VALVE LVOT Vmax:   80.10 cm/s LVOT Vmean:  61.600 cm/s LVOT VTI:    0.153 m  AORTA Ao Root diam: 2.80 cm Ao Asc diam:  3.40 cm  MITRAL VALVE MV Area (PHT): 6.83 cm  SHUNTS MV Area VTI:   2.41 cm     Systemic VTI:  0.15 m MV Peak grad:  6.7 mmHg     Systemic Diam: 2.30 cm MV Mean grad:  3.0 mmHg MV Vmax:       1.29 m/s MV Vmean:      80.0 cm/s MV Decel Time: 111 msec MV E velocity: 121.00 cm/s MV A velocity: 62.60 cm/s MV E/A ratio:  1.93 Eleonore Chiquito MD Electronically signed by Eleonore Chiquito MD Signature Date/Time: 12/23/2021/11:33:37 AM    Final     Assessment/Plan **CAP complicated by hypoxic respiratory failure: on admission CXR - CAP tx per primary; afebrile now and leukocytosis resolved.   **AKI on CKD: advanced CKD at baseline, cr 4, GFR ~20.  Now with oliguric AKI Cr up to 6.7 today.   Appears dry and BP lower than typical - suspect prerenal/hypovolemia potentially progressed to ATN.  UA from admission looks like UTI but asymptomatic.  Will send for culture and get urine lytes.  Renal US 10/2021 ok - I don't think it needs to be repeated but will bladder scan.  Agree with holding diuretics.  Getting pRBC but if BP remains low and pulm status stable would consider small volume challenge today as well with isotonic fluids.  No indications for dialysis but certainly could head that way - discussed with patient.  Cont I/Os, weights, daily labs, avoid nephrotoxins.   **Anemia:  baseline 9, now 6.7.  no obvious bleeding.  Getting pRBC today. Add on iron indices to this AMs labs.  Consider ESA while in -says was not getting outpt.   **HTN:  baseline BPs in prior hosp 140-160s it appears.  Currently much lower - 100s during exam.  Hold meds, volume expand.   **Metabolic acidosis: mild, is on oral bicarb.   **combined CHF:  currently appearing hypovolemic but had 2 admissions in 10/2021 for overload.  Caution with fluids - getting pRBC so hold on IVF for now but may end up needing.   GDT limited by AKI/CKD.   **DM: per primary; decreased gabapentin to 300 qhs from BID given GFR.  Will follow, contact with concerns.    Justin Mend 12/24/2021, 11:52 AM

## 2021-12-24 NOTE — Progress Notes (Signed)
Patient have not been able to urinate, got patient up to try but unable to void, bladder scan 403, Dr. Roderic Palau notified order for I/O cath, unable with regular cath, order given for coude cath. I/O cath 332m. Will continue to assess patient.

## 2021-12-24 NOTE — Progress Notes (Signed)
PROGRESS NOTE    Ryan Jenkins  KNL:[id number] DOB: 05-Dec-1960 DOA: 12/22/2021 PCP: [name], MD    Brief Narrative:  61 year old male with a history of chronic kidney disease stage IV, diastolic heart failure, recently in the hospital for decompensated CHF and was released last month after significant diuresis, comes to the hospital with cough and shortness of breath.  X-ray shows multifocal pneumonia.  He does appear to have some worsening in renal function.  Started on IV antibiotics.   Assessment & Plan:   Principal Problem:   Multifocal pneumonia Active Problems:   Acute on chronic diastolic CHF (congestive heart failure) (HCC)   Type 2 diabetes mellitus with peripheral neuropathy (HCC)   Essential hypertension   Hyperlipidemia   Gastroparesis   Gastroesophageal reflux disease without esophagitis   Acute renal failure superimposed on stage 4 chronic kidney disease (HCC)   Coronary artery disease   BPH (benign prostatic hyperplasia)   Anemia in chronic kidney disease (CKD)   Multifocal pneumonia -On ceftriaxone and azithromycin -Continue pulmonary hygiene -Encourage flutter valve -strep pneumo antigen negative. Legionella antigen in process -oxygen requirements up to 11L, now has been weaned down to 2L  Chronic diastolic congestive heart failure -Clinically, the patient does appear to be compensated -Current weight is similar to previous discharge weight when he was released after adequate diuresis.  He does not have significant peripheral edema -holding off on lasix for now since creatinine has trended up  AKI on CKD stage IV -Baseline creatinine approximately 4 -Creatinine on admission noted to be 4.9 which has increased to 6.6 -appears to be low on volume, so would avoid diuresis. Will consider IV fluids if respiratory status stable after blood transfusion -Nephrology following, appreciate input -Renal ultrasound negative for obstructive  process  Normal anion gap metabolic acidosis -Likely related to renal failure -Start oral bicarb  Anemia chronic kidney disease -Current hemoglobin is 8.2, which trended down to 6.7 -Baseline hemoglobin appears to run between 8-9 -no reported bleeding -monitor stools -transfuse 1 unit prbc -Continue to monitor  Gastroparesis -Continue on Compazine  Insulin-dependent diabetes -Chronically on Lantus, currently on hold -Continue on sliding scale insulin -Overall blood sugars appear to be stable  Hyperlipidemia -Continue on statin  Hypertension -Chronically on amlodipine, hydralazine, metoprolol -holding all meds since his blood pressure is low normal and creatinine has trended up  DVT prophylaxis: heparin injection 5,000 Units Start: 12/22/21 2200  Code Status: Full code Family Communication: Discussed with patient Disposition Plan: Status is: Inpatient Remains inpatient appropriate because: Continued management of renal failure and pneumonia     Consultants:  nephrology  Procedures:    Antimicrobials:  Ceftriaxone Azithromycin   Subjective: Says he does not feel well. Cough is less productive. He feels weak. Oxygen requirements improving. Had a BM earlier today, he is unsure color of BM. He is unaware of any bleeding  Objective: Vitals:   12/24/21 0936 12/24/21 1000 12/24/21 1200 12/24/21 1400  BP: 123/70 117/63 101/62   Pulse: 70 66  62  Resp:  '17 14 20  '$ Temp:   98.7 F (37.1 C)   TempSrc:   Axillary   SpO2:  100%  100%  Weight:      Height:        Intake/Output Summary (Last 24 hours) at 12/24/2021 1427 Last data filed at 12/24/2021 0936 Gross per 24 hour  Intake 240 ml  Output 50 ml  Net 190 ml   Filed Weights   12/22/21 7902  Weight: 93.4 kg    Examination:  General exam: Appears calm and comfortable  Respiratory system: Clear to auscultation. Respiratory effort normal. Cardiovascular system: S1 & S2 heard, RRR. No JVD, murmurs,  rubs, gallops or clicks. No pedal edema. Gastrointestinal system: Abdomen is nondistended, soft and nontender. No organomegaly or masses felt. Normal bowel sounds heard. Central nervous system: Alert and oriented. No focal neurological deficits. Extremities: Symmetric 5 x 5 power. Skin: No rashes, lesions or ulcers Psychiatry: Judgement and insight appear normal. Mood & affect appropriate.     Data Reviewed: I have personally reviewed following labs and imaging studies  CBC: Recent Labs  Lab 12/22/21 0730 12/23/21 0444 12/24/21 1005  WBC 13.6* 12.4* 5.8  NEUTROABS 12.0*  --   --   HGB 9.1* 8.2* 6.7*  HCT 28.7* 25.8* 21.4*  MCV 92.0 91.5 93.0  PLT 296 253 378   Basic Metabolic Panel: Recent Labs  Lab 12/22/21 0730 12/23/21 0444 12/24/21 1005  NA 136 138 137  K 3.6 3.6 3.9  CL 105 107 105  CO2 19* 18* 19*  GLUCOSE 233* 193* 165*  BUN 58* 66* 79*  CREATININE 4.97* 5.43* 6.66*  CALCIUM 8.5* 8.5* 8.2*   GFR: Estimated Creatinine Clearance: 13.5 mL/min (A) (by C-G formula based on SCr of 6.66 mg/dL (H)). Liver Function Tests: Recent Labs  Lab 12/22/21 0730 12/23/21 0444  AST 23 41  ALT 12 26  ALKPHOS 71 119  BILITOT 1.0 1.4*  PROT 8.2* 7.9  ALBUMIN 3.3* 3.2*   Recent Labs  Lab 12/22/21 0730  LIPASE 28   No results for input(s): "AMMONIA" in the last 168 hours. Coagulation Profile: No results for input(s): "INR", "PROTIME" in the last 168 hours. Cardiac Enzymes: No results for input(s): "CKTOTAL", "CKMB", "CKMBINDEX", "TROPONINI" in the last 168 hours. BNP (last 3 results) No results for input(s): "PROBNP" in the last 8760 hours. HbA1C: No results for input(s): "HGBA1C" in the last 72 hours. CBG: Recent Labs  Lab 12/23/21 1149 12/23/21 1617 12/23/21 2151 12/24/21 0758 12/24/21 1130  GLUCAP 174* 118* 146* 141* 166*   Lipid Profile: No results for input(s): "CHOL", "HDL", "LDLCALC", "TRIG", "CHOLHDL", "LDLDIRECT" in the last 72 hours. Thyroid  Function Tests: No results for input(s): "TSH", "T4TOTAL", "FREET4", "T3FREE", "THYROIDAB" in the last 72 hours. Anemia Panel: No results for input(s): "VITAMINB12", "FOLATE", "FERRITIN", "TIBC", "IRON", "RETICCTPCT" in the last 72 hours. Sepsis Labs: Recent Labs  Lab 12/22/21 0730  PROCALCITON 0.62    Recent Results (from the past 240 hour(s))  Resp Panel by RT-PCR (Flu A&B, Covid) Anterior Nasal Swab     Status: None   Collection Time: 12/22/21  7:30 AM   Specimen: Anterior Nasal Swab  Result Value Ref Range Status   SARS Coronavirus 2 by RT PCR NEGATIVE NEGATIVE Final    Comment: (NOTE) SARS-CoV-2 target nucleic acids are NOT DETECTED.  The SARS-CoV-2 RNA is generally detectable in upper respiratory specimens during the acute phase of infection. The lowest concentration of SARS-CoV-2 viral copies this assay can detect is 138 copies/mL. A negative result does not preclude SARS-Cov-2 infection and should not be used as the sole basis for treatment or other patient management decisions. A negative result may occur with  improper specimen collection/handling, submission of specimen other than nasopharyngeal swab, presence of viral mutation(s) within the areas targeted by this assay, and inadequate number of viral copies(<138 copies/mL). A negative result must be combined with clinical observations, patient history, and epidemiological information. The expected result  is Negative.  Fact Sheet for Patients:  EntrepreneurPulse.com.au  Fact Sheet for Healthcare Providers:  IncredibleEmployment.be  This test is no t yet approved or cleared by the [place] FDA and  has been authorized for detection and/or diagnosis of SARS-CoV-2 by FDA under an Emergency Use Authorization (EUA). This EUA will remain  in effect (meaning this test can be used) for the duration of the COVID-19 declaration under Section 564(b)(1) of the Act, 21 U.S.C.section  360bbb-3(b)(1), unless the authorization is terminated  or revoked sooner.       Influenza A by PCR NEGATIVE NEGATIVE Final   Influenza B by PCR NEGATIVE NEGATIVE Final    Comment: (NOTE) The Xpert Xpress SARS-CoV-2/FLU/RSV plus assay is intended as an aid in the diagnosis of influenza from Nasopharyngeal swab specimens and should not be used as a sole basis for treatment. Nasal washings and aspirates are unacceptable for Xpert Xpress SARS-CoV-2/FLU/RSV testing.  Fact Sheet for Patients: EntrepreneurPulse.com.au  Fact Sheet for Healthcare Providers: IncredibleEmployment.be  This test is not yet approved or cleared by the [place] FDA and has been authorized for detection and/or diagnosis of SARS-CoV-2 by FDA under an Emergency Use Authorization (EUA). This EUA will remain in effect (meaning this test can be used) for the duration of the COVID-19 declaration under Section 564(b)(1) of the Act, 21 U.S.C. section 360bbb-3(b)(1), unless the authorization is terminated or revoked.  Performed at [facility], [place] [address], [place], [place] [address]          Radiology Studies: ECHOCARDIOGRAM COMPLETE  Result Date: 12/23/2021    ECHOCARDIOGRAM REPORT   Patient Name:   Ryan Jenkins Date of Exam: 12/23/2021 Medical Rec #:  [id number]         Height:       74.0 in Accession #:    [id number]        Weight:       206.0 lb Date of Birth:  Apr 26, 1960         BSA:          2.201 m Patient Age:    28 years          BP:           142/77 mmHg Patient Gender: M                 HR:           91 bpm. Exam Location:  Inpatient Procedure: 2D Echo, Cardiac Doppler and Color Doppler Indications:    I95.18 Acute diastolic (congestive) heart failure  History:        Patient has prior history of Echocardiogram examinations, most                 recent 11/26/2021. CHF, CAD; Risk Factors:Diabetes, Dyslipidemia                 and Hypertension.  CKD.  Sonographer:    Eartha Inch Referring Phys: [id number] Jonnie Finner  Sonographer Comments: Image acquisition challenging due to patient body habitus and Image acquisition challenging due to respiratory motion. IMPRESSIONS  1. Left ventricular ejection fraction, by estimation, is 50 to 55%. The left ventricle has low normal function. The left ventricle demonstrates global hypokinesis. There is moderate concentric left ventricular hypertrophy. Left ventricular diastolic parameters are consistent with Grade II diastolic dysfunction (pseudonormalization).  2. Right ventricular systolic function is mildly reduced. The right ventricular size is mildly enlarged. Tricuspid regurgitation signal is inadequate for  assessing PA pressure.  3. Left atrial size was mildly dilated.  4. The mitral valve is grossly normal. Trivial mitral valve regurgitation. No evidence of mitral stenosis.  5. The aortic valve is tricuspid. Aortic valve regurgitation is not visualized. Aortic valve sclerosis is present, with no evidence of aortic valve stenosis.  6. The inferior vena cava is dilated in size with >50% respiratory variability, suggesting right atrial pressure of 8 mmHg. Comparison(s): LVEF 50-55% on this study, slightly reduced from 55-60. FINDINGS  Left Ventricle: Left ventricular ejection fraction, by estimation, is 50 to 55%. The left ventricle has low normal function. The left ventricle demonstrates global hypokinesis. The left ventricular internal cavity size was normal in size. There is moderate concentric left ventricular hypertrophy. Left ventricular diastolic parameters are consistent with Grade II diastolic dysfunction (pseudonormalization). Right Ventricle: The right ventricular size is mildly enlarged. No increase in right ventricular wall thickness. Right ventricular systolic function is mildly reduced. Tricuspid regurgitation signal is inadequate for assessing PA pressure. Left Atrium: Left atrial size was mildly  dilated. Right Atrium: Right atrial size was normal in size. Pericardium: Trivial pericardial effusion is present. Mitral Valve: The mitral valve is grossly normal. Trivial mitral valve regurgitation. No evidence of mitral valve stenosis. MV peak gradient, 6.7 mmHg. The mean mitral valve gradient is 3.0 mmHg. Tricuspid Valve: The tricuspid valve is grossly normal. Tricuspid valve regurgitation is trivial. No evidence of tricuspid stenosis. Aortic Valve: The aortic valve is tricuspid. Aortic valve regurgitation is not visualized. Aortic valve sclerosis is present, with no evidence of aortic valve stenosis. Pulmonic Valve: The pulmonic valve was grossly normal. Pulmonic valve regurgitation is trivial. No evidence of pulmonic stenosis. Aorta: The aortic root and ascending aorta are structurally normal, with no evidence of dilitation. Venous: The inferior vena cava is dilated in size with greater than 50% respiratory variability, suggesting right atrial pressure of 8 mmHg. IAS/Shunts: The atrial septum is grossly normal.  LEFT VENTRICLE PLAX 2D LVIDd:         3.80 cm      Diastology LVIDs:         3.00 cm      LV e' medial:    4.87 cm/s LV PW:         1.60 cm      LV E/e' medial:  24.8 LV IVS:        1.60 cm      LV e' lateral:   10.80 cm/s LVOT diam:     2.30 cm      LV E/e' lateral: 11.2 LV SV:         64 LV SV Index:   29 LVOT Area:     4.15 cm  LV Volumes (MOD) LV vol d, MOD A2C: 153.0 ml LV vol d, MOD A4C: 198.0 ml LV vol s, MOD A2C: 88.8 ml LV vol s, MOD A4C: 125.0 ml LV SV MOD A2C:     64.2 ml LV SV MOD A4C:     198.0 ml LV SV MOD BP:      62.0 ml RIGHT VENTRICLE            IVC RV S prime:     8.56 cm/s  IVC diam: 2.10 cm TAPSE (M-mode): 1.4 cm LEFT ATRIUM             Index        RIGHT ATRIUM           Index LA diam:  4.70 cm 2.14 cm/m   RA Area:     15.10 cm LA Vol (A2C):   85.6 ml 38.89 ml/m  RA Volume:   35.50 ml  16.13 ml/m LA Vol (A4C):   73.1 ml 33.21 ml/m LA Biplane Vol: 79.3 ml 36.03 ml/m   AORTIC VALVE LVOT Vmax:   80.10 cm/s LVOT Vmean:  61.600 cm/s LVOT VTI:    0.153 m  AORTA Ao Root diam: 2.80 cm Ao Asc diam:  3.40 cm MITRAL VALVE MV Area (PHT): 6.83 cm     SHUNTS MV Area VTI:   2.41 cm     Systemic VTI:  0.15 m MV Peak grad:  6.7 mmHg     Systemic Diam: 2.30 cm MV Mean grad:  3.0 mmHg MV Vmax:       1.29 m/s MV Vmean:      80.0 cm/s MV Decel Time: 111 msec MV E velocity: 121.00 cm/s MV A velocity: 62.60 cm/s MV E/A ratio:  1.93 Eleonore Chiquito MD Electronically signed by Eleonore Chiquito MD Signature Date/Time: 12/23/2021/11:33:37 AM    Final         Scheduled Meds:  sodium chloride   Intravenous Once   clopidogrel  75 mg Oral Daily   escitalopram  20 mg Oral Daily   finasteride  5 mg Oral Daily   [START ON 12/25/2021] gabapentin  300 mg Oral QHS   guaiFENesin  600 mg Oral BID   heparin  5,000 Units Subcutaneous Q8H   insulin aspart  0-5 Units Subcutaneous QHS   insulin aspart  0-9 Units Subcutaneous TID WC   ipratropium-albuterol  3 mL Nebulization TID   metoprolol succinate  50 mg Oral Daily   nortriptyline  25 mg Oral Daily   pantoprazole  40 mg Oral Daily   rosuvastatin  10 mg Oral Daily   sodium bicarbonate  650 mg Oral Daily   tamsulosin  0.4 mg Oral Daily   Continuous Infusions:  sodium chloride 10 mL/hr at 12/24/21 1231   azithromycin 500 mg (12/24/21 1317)   cefTRIAXone (ROCEPHIN)  IV 1 g (12/24/21 1232)     LOS: 2 days    Time spent: 35 minutes    Kathie Dike, MD [facility]   If 7PM-7AM, please contact night-coverage [email or web address]  12/24/2021, 2:27 PM

## 2021-12-24 NOTE — Progress Notes (Signed)
Patient has shown progress in respiratory status. Patient was on HFNC at 11 LPM at start of shift. He has now been weaned down to 7 LPM via HFNC, maintaining O2 sat of 99-100%. He continues with generalized weakness and requires 2 person assist with walker for ambulation.

## 2021-12-25 ENCOUNTER — Inpatient Hospital Stay (HOSPITAL_COMMUNITY): Payer: Medicaid Other

## 2021-12-25 DIAGNOSIS — N179 Acute kidney failure, unspecified: Secondary | ICD-10-CM | POA: Diagnosis not present

## 2021-12-25 DIAGNOSIS — I5033 Acute on chronic diastolic (congestive) heart failure: Secondary | ICD-10-CM | POA: Diagnosis not present

## 2021-12-25 DIAGNOSIS — J189 Pneumonia, unspecified organism: Secondary | ICD-10-CM | POA: Diagnosis not present

## 2021-12-25 DIAGNOSIS — N184 Chronic kidney disease, stage 4 (severe): Secondary | ICD-10-CM | POA: Diagnosis not present

## 2021-12-25 LAB — BPAM RBC
Blood Product Expiration Date: 202312312359
ISSUE DATE / TIME: 202312101642
Unit Type and Rh: 7300

## 2021-12-25 LAB — RENAL FUNCTION PANEL
Albumin: 3.1 g/dL — ABNORMAL LOW (ref 3.5–5.0)
Anion gap: 14 (ref 5–15)
BUN: 93 mg/dL — ABNORMAL HIGH (ref 8–23)
CO2: 17 mmol/L — ABNORMAL LOW (ref 22–32)
Calcium: 8.2 mg/dL — ABNORMAL LOW (ref 8.9–10.3)
Chloride: 104 mmol/L (ref 98–111)
Creatinine, Ser: 6.74 mg/dL — ABNORMAL HIGH (ref 0.61–1.24)
GFR, Estimated: 9 mL/min — ABNORMAL LOW (ref >60)
Glucose, Bld: 149 mg/dL — ABNORMAL HIGH (ref 70–99)
Phosphorus: 6.1 mg/dL — ABNORMAL HIGH (ref 2.5–4.6)
Potassium: 4.2 mmol/L (ref 3.5–5.1)
Sodium: 135 mmol/L (ref 135–145)

## 2021-12-25 LAB — CBC
HCT: 25.7 % — ABNORMAL LOW (ref 39.0–52.0)
Hemoglobin: 8.1 g/dL — ABNORMAL LOW (ref 13.0–17.0)
MCH: 29 pg (ref 26.0–34.0)
MCHC: 31.5 g/dL (ref 30.0–36.0)
MCV: 92.1 fL (ref 80.0–100.0)
Platelets: 242 10*3/uL (ref 150–400)
RBC: 2.79 MIL/uL — ABNORMAL LOW (ref 4.22–5.81)
RDW: 16.1 % — ABNORMAL HIGH (ref 11.5–15.5)
WBC: 6.2 10*3/uL (ref 4.0–10.5)
nRBC: 0 % (ref 0.0–0.2)

## 2021-12-25 LAB — TYPE AND SCREEN
ABO/RH(D): B POS
Antibody Screen: NEGATIVE
Unit division: 0

## 2021-12-25 LAB — GLUCOSE, CAPILLARY
Glucose-Capillary: 131 mg/dL — ABNORMAL HIGH (ref 70–99)
Glucose-Capillary: 132 mg/dL — ABNORMAL HIGH (ref 70–99)
Glucose-Capillary: 119 mg/dL — ABNORMAL HIGH (ref 70–99)
Glucose-Capillary: 203 mg/dL — ABNORMAL HIGH (ref 70–99)

## 2021-12-25 MED ORDER — SODIUM CHLORIDE 0.9 % IV BOLUS
500.0000 mL | Freq: Once | INTRAVENOUS | Status: AC
  Administered 2021-12-25: 500 mL via INTRAVENOUS

## 2021-12-25 MED ORDER — OXYCODONE-ACETAMINOPHEN 5-325 MG PO TABS
1.0000 | ORAL_TABLET | ORAL | Status: DC | PRN
  Administered 2021-12-25 – 2021-12-29 (×10): 1 via ORAL
  Filled 2021-12-25 (×10): qty 1

## 2021-12-25 MED ORDER — IPRATROPIUM-ALBUTEROL 0.5-2.5 (3) MG/3ML IN SOLN
3.0000 mL | Freq: Two times a day (BID) | RESPIRATORY_TRACT | Status: DC
  Administered 2021-12-25 – 2021-12-28 (×7): 3 mL via RESPIRATORY_TRACT
  Filled 2021-12-25 (×7): qty 3

## 2021-12-25 MED ORDER — AZITHROMYCIN 250 MG PO TABS
500.0000 mg | ORAL_TABLET | Freq: Every day | ORAL | Status: DC
  Administered 2021-12-25 – 2021-12-27 (×3): 500 mg via ORAL
  Filled 2021-12-25 (×3): qty 2

## 2021-12-25 MED ORDER — ORAL CARE MOUTH RINSE: 15.0000 mL | OROMUCOSAL | Status: DC | PRN

## 2021-12-25 MED ORDER — SODIUM CHLORIDE 0.9 % IV SOLN: INTRAVENOUS | Status: DC

## 2021-12-25 NOTE — Progress Notes (Signed)
Ryan Jenkins Progress Note  Subjective: pt seen in room, no c/o's  Vitals:   12/24/21 1916 12/24/21 2000 12/24/21 2200 12/25/21 0000  BP: 117/72 119/71 121/73 127/70  Pulse: 63  63 65  Resp: 16 (!) 25 (!) 23 (!) 25  Temp: 98.2 F (36.8 C)     TempSrc: Oral     SpO2: 100%  100% 92%  Weight:      Height:        Exam: Gen: looks tired but not in distress Neck: no JVD CV:  RRR, no rub Abd:  soft, nontender Lungs: basilar rhonchi, normal WOB GU: no foley Extr: no edema Neuro: nonfocal   Assessment/ Plan: AKI on CKD 4 - advanced CKD at baseline, cr 4, GFR ~20.  Now with oliguric AKI w/ creat of 4.9 on admission 12/09.   Appeared dry and BP was lower than typical - suspect prerenal/hypovolemia potentially progressed to ATN.  UCx grew klebsiella. Renal US 10/2021 ok, was not repeated.  Agree with holding diuretics. Getting pRBC's but if BP remains low and pulm status stable would consider small volume challenge today as well with isotonic fluids. Creat stable at 6.7 today, no uremic symptoms. CXR shows clearing of all infiltrates today.  Will cont IVF"s and f/u labs in am.  CAP complicated by hypoxic respiratory failure- on admission CXR, CAP tx per primary; afebrile now and leukocytosis resolved Anemia - baseline 9, now 6.7.  no obvious bleeding.  Getting pRBC today. Add on iron indices to this AMs labs.  Consider ESA while in -says was not getting outpt HTN - baseline BPs in prior hosp 140-160s it appears.  Currently much lower - 100s during exam.  Hold meds, volume expand Metabolic acidosis - mild, is on oral bicarb Combined CHF - had 2 admissions in 10/2021 for overload, but this time looked dry. DM: per primary - decreased gabapentin to 300 qhs from BID given GFR.   Rob Ercell Perlman 12/25/2021, 4:46 AM   Recent Labs  Lab 12/23/21 0444 12/24/21 1005 12/24/21 2324 12/25/21 0404  HGB 8.2* 6.7* 7.7* 8.1*  ALBUMIN 3.2*  --   --  3.1*  CALCIUM 8.5* 8.2*  --  8.2*   PHOS  --   --   --  6.1*  CREATININE 5.43* 6.66*  --  6.74*  K 3.6 3.9  --  4.2   No results for input(s): "IRON", "TIBC", "FERRITIN" in the last 168 hours. Inpatient medications:  clopidogrel  75 mg Oral Daily   escitalopram  20 mg Oral Daily   finasteride  5 mg Oral Daily   gabapentin  300 mg Oral QHS   guaiFENesin  600 mg Oral BID   heparin  5,000 Units Subcutaneous Q8H   insulin aspart  0-5 Units Subcutaneous QHS   insulin aspart  0-9 Units Subcutaneous TID WC   ipratropium-albuterol  3 mL Nebulization TID   nortriptyline  25 mg Oral Daily   pantoprazole  40 mg Oral Daily   rosuvastatin  10 mg Oral Daily   sodium bicarbonate  650 mg Oral Daily   tamsulosin  0.4 mg Oral Daily    sodium chloride 10 mL/hr at 12/24/21 1231   azithromycin 500 mg (12/24/21 1317)   cefTRIAXone (ROCEPHIN)  IV 1 g (12/24/21 1232)   sodium chloride, acetaminophen **OR** acetaminophen, mouth rinse, prochlorperazine

## 2021-12-25 NOTE — Progress Notes (Signed)
PHARMACIST - PHYSICIAN COMMUNICATION DR:   Roderic Palau CONCERNING: Antibiotic IV to Oral Route Change Policy  RECOMMENDATION: This patient is receiving azithromycin by the intravenous route.  Based on criteria approved by the Pharmacy and Therapeutics Committee, the antibiotic(s) is/are being converted to the equivalent oral dose form(s).   DESCRIPTION: These criteria include: Patient being treated for a respiratory tract infection, urinary tract infection, cellulitis or clostridium difficile associated diarrhea if on metronidazole The patient is not neutropenic and does not exhibit a GI malabsorption state The patient is eating (either orally or via tube) and/or has been taking other orally administered medications for a least 24 hours The patient is improving clinically and has a Tmax < 100.5  If you have questions about this conversion, please contact the Pharmacy Department  '[]'$   [phone] )  [facility] '[]'$   [phone] )  [facility]  '[]'$   [phone] )  [facility] '[x]'$   [phone] )  [facility]    Thank you for allowing pharmacy to be a part of this patient's care.  Royetta Asal, PharmD, BCPS Clinical Pharmacist [place] Please utilize Amion for appropriate phone number to reach the unit pharmacist ([place]) 12/25/2021 7:22 AM

## 2021-12-25 NOTE — Progress Notes (Signed)
PROGRESS NOTE    Ryan Jenkins  MAU:[id number] DOB: 08-28-1960 DOA: 12/22/2021 PCP: [name], MD    Brief Narrative:  61 year old male with a history of chronic kidney disease stage IV, diastolic heart failure, recently in the hospital for decompensated CHF and was released last month after significant diuresis, comes to the hospital with cough and shortness of breath.  X-ray shows multifocal pneumonia.  He does appear to have some worsening in renal function.  Started on IV antibiotics.   Assessment & Plan:   Principal Problem:   Multifocal pneumonia Active Problems:   Acute on chronic diastolic CHF (congestive heart failure) (HCC)   Type 2 diabetes mellitus with peripheral neuropathy (HCC)   Essential hypertension   Hyperlipidemia   Gastroparesis   Gastroesophageal reflux disease without esophagitis   Acute renal failure superimposed on stage 4 chronic kidney disease (HCC)   Coronary artery disease   BPH (benign prostatic hyperplasia)   Anemia in chronic kidney disease (CKD)   Multifocal pneumonia -On ceftriaxone and azithromycin -Continue pulmonary hygiene -Encourage flutter valve -strep pneumo antigen negative. Legionella antigen in process -oxygen requirements up to 11L, now has been weaned down to RA  Chronic diastolic congestive heart failure -Clinically, the patient does appear to be compensated -Current weight is similar to previous discharge weight when he was released after adequate diuresis.  He does not have significant peripheral edema -holding off on lasix for now since creatinine has trended up  AKI on CKD stage IV -Baseline creatinine approximately 4 -Creatinine on admission noted to be 4.9 which has increased to 6.7 -appears to be low on volume, so would avoid diuresis.  Started on IV fluid challenge -Nephrology following, appreciate input -Renal ultrasound negative for obstructive process  Normal anion gap metabolic acidosis -Likely  related to renal failure -Started oral bicarb  Anemia chronic kidney disease -Current hemoglobin is 8.2, which trended down to 6.7 -Baseline hemoglobin appears to run between 8-9 -no reported bleeding -monitor stools -transfused 1 unit prbc with follow-up hemoglobin improving to 8.1 -Continue to monitor  Gastroparesis -Continue on Compazine  Insulin-dependent diabetes -Chronically on Lantus, currently on hold -Continue on sliding scale insulin -Overall blood sugars appear to be stable  Hyperlipidemia -Continue on statin  Hypertension -Chronically on amlodipine, hydralazine, metoprolol -holding all meds since his blood pressure is low normal and creatinine has trended up  Klebsiella UTI -Present on admission -May be colonized -Follow-up sensitivities  BPH with bladder outlet obstruction -Status post TURP 10/2021 -Noted to have postvoid residuals around 400 cc -Catheter placement with coud catheter performed -Discussed with his primary urologist, [name] who reported that patient has a areflexic bladder and he had mentioned to the patient that TURP may not help his urinary retention issues -He is recommended to continue catheter on discharge until he can follow-up with urology as an outpatient -Continue on Flomax and finasteride  DVT prophylaxis: heparin injection 5,000 Units Start: 12/22/21 2200  Code Status: Full code Family Communication: Discussed with patient Disposition Plan: Status is: Inpatient Remains inpatient appropriate because: Continued management of renal failure and pneumonia     Consultants:  nephrology  Procedures:    Antimicrobials:  Ceftriaxone Azithromycin   Subjective: Feels weak.  Objective: Vitals:   12/25/21 0503 12/25/21 0919 12/25/21 1325 12/25/21 2003  BP: 139/77  134/73   Pulse: 70  72   Resp: (!) 21  20   Temp: 98 F (36.7 C)  98.1 F (36.7 C)   TempSrc: Oral  Oral   SpO2: 100% 99% 93% 97%  Weight:      Height:         Intake/Output Summary (Last 24 hours) at 12/25/2021 2029 Last data filed at 12/25/2021 1237 Gross per 24 hour  Intake 240.23 ml  Output 400 ml  Net -159.77 ml   Filed Weights   12/22/21 0632 12/25/21 0500  Weight: 93.4 kg 103 kg    Examination:  General exam: Appears calm and comfortable  Respiratory system: Clear to auscultation. Respiratory effort normal. Cardiovascular system: S1 & S2 heard, RRR. No JVD, murmurs, rubs, gallops or clicks. No pedal edema. Gastrointestinal system: Abdomen is nondistended, soft and nontender. No organomegaly or masses felt. Normal bowel sounds heard. Central nervous system: Alert and oriented. No focal neurological deficits. Extremities: Symmetric 5 x 5 power. Skin: No rashes, lesions or ulcers Psychiatry: Judgement and insight appear normal. Mood & affect appropriate.     Data Reviewed: I have personally reviewed following labs and imaging studies  CBC: Recent Labs  Lab 12/22/21 0730 12/23/21 0444 12/24/21 1005 12/24/21 2324 12/25/21 0404  WBC 13.6* 12.4* 5.8  --  6.2  NEUTROABS 12.0*  --   --   --   --   HGB 9.1* 8.2* 6.7* 7.7* 8.1*  HCT 28.7* 25.8* 21.4* 24.4* 25.7*  MCV 92.0 91.5 93.0  --  92.1  PLT 296 253 219  --  712   Basic Metabolic Panel: Recent Labs  Lab 12/22/21 0730 12/23/21 0444 12/24/21 1005 12/25/21 0404  NA 136 138 137 135  K 3.6 3.6 3.9 4.2  CL 105 107 105 104  CO2 19* 18* 19* 17*  GLUCOSE 233* 193* 165* 149*  BUN 58* 66* 79* 93*  CREATININE 4.97* 5.43* 6.66* 6.74*  CALCIUM 8.5* 8.5* 8.2* 8.2*  PHOS  --   --   --  6.1*   GFR: Estimated Creatinine Clearance: 14.7 mL/min (A) (by C-G formula based on SCr of 6.74 mg/dL (H)). Liver Function Tests: Recent Labs  Lab 12/22/21 0730 12/23/21 0444 12/25/21 0404  AST 23 41  --   ALT 12 26  --   ALKPHOS 71 119  --   BILITOT 1.0 1.4*  --   PROT 8.2* 7.9  --   ALBUMIN 3.3* 3.2* 3.1*   Recent Labs  Lab 12/22/21 0730  LIPASE 28   No results for  input(s): "AMMONIA" in the last 168 hours. Coagulation Profile: No results for input(s): "INR", "PROTIME" in the last 168 hours. Cardiac Enzymes: No results for input(s): "CKTOTAL", "CKMB", "CKMBINDEX", "TROPONINI" in the last 168 hours. BNP (last 3 results) No results for input(s): "PROBNP" in the last 8760 hours. HbA1C: No results for input(s): "HGBA1C" in the last 72 hours. CBG: Recent Labs  Lab 12/24/21 1650 12/24/21 2208 12/25/21 0810 12/25/21 1123 12/25/21 1637  GLUCAP 187* 181* 131* 132* 119*   Lipid Profile: No results for input(s): "CHOL", "HDL", "LDLCALC", "TRIG", "CHOLHDL", "LDLDIRECT" in the last 72 hours. Thyroid Function Tests: No results for input(s): "TSH", "T4TOTAL", "FREET4", "T3FREE", "THYROIDAB" in the last 72 hours. Anemia Panel: No results for input(s): "VITAMINB12", "FOLATE", "FERRITIN", "TIBC", "IRON", "RETICCTPCT" in the last 72 hours. Sepsis Labs: Recent Labs  Lab 12/22/21 0730  PROCALCITON 0.62    Recent Results (from the past 240 hour(s))  Resp Panel by RT-PCR (Flu A&B, Covid) Anterior Nasal Swab     Status: None   Collection Time: 12/22/21  7:30 AM   Specimen: Anterior Nasal Swab  Result Value Ref  Range Status   SARS Coronavirus 2 by RT PCR NEGATIVE NEGATIVE Final    Comment: (NOTE) SARS-CoV-2 target nucleic acids are NOT DETECTED.  The SARS-CoV-2 RNA is generally detectable in upper respiratory specimens during the acute phase of infection. The lowest concentration of SARS-CoV-2 viral copies this assay can detect is 138 copies/mL. A negative result does not preclude SARS-Cov-2 infection and should not be used as the sole basis for treatment or other patient management decisions. A negative result may occur with  improper specimen collection/handling, submission of specimen other than nasopharyngeal swab, presence of viral mutation(s) within the areas targeted by this assay, and inadequate number of viral copies(<138 copies/mL). A  negative result must be combined with clinical observations, patient history, and epidemiological information. The expected result is Negative.  Fact Sheet for Patients:  EntrepreneurPulse.com.au  Fact Sheet for Healthcare Providers:  IncredibleEmployment.be  This test is no t yet approved or cleared by the [place] FDA and  has been authorized for detection and/or diagnosis of SARS-CoV-2 by FDA under an Emergency Use Authorization (EUA). This EUA will remain  in effect (meaning this test can be used) for the duration of the COVID-19 declaration under Section 564(b)(1) of the Act, 21 U.S.C.section 360bbb-3(b)(1), unless the authorization is terminated  or revoked sooner.       Influenza A by PCR NEGATIVE NEGATIVE Final   Influenza B by PCR NEGATIVE NEGATIVE Final    Comment: (NOTE) The Xpert Xpress SARS-CoV-2/FLU/RSV plus assay is intended as an aid in the diagnosis of influenza from Nasopharyngeal swab specimens and should not be used as a sole basis for treatment. Nasal washings and aspirates are unacceptable for Xpert Xpress SARS-CoV-2/FLU/RSV testing.  Fact Sheet for Patients: EntrepreneurPulse.com.au  Fact Sheet for Healthcare Providers: IncredibleEmployment.be  This test is not yet approved or cleared by the [place] FDA and has been authorized for detection and/or diagnosis of SARS-CoV-2 by FDA under an Emergency Use Authorization (EUA). This EUA will remain in effect (meaning this test can be used) for the duration of the COVID-19 declaration under Section 564(b)(1) of the Act, 21 U.S.C. section 360bbb-3(b)(1), unless the authorization is terminated or revoked.  Performed at [facility], [place] [address], [place], [place] [address]   Urine Culture     Status: Abnormal (Preliminary result)   Collection Time: 12/22/21  7:30 AM   Specimen: Urine, Clean Catch  Result  Value Ref Range Status   Specimen Description   Final    URINE, CLEAN CATCH Performed at [facility], [place] [address] [place], [place] [address]    Special Requests   Final    NONE Performed at [facility], [place] [address], [place], [place] [address]    Culture (A)  Final    >=100,000 COLONIES/mL KLEBSIELLA PNEUMONIAE SUSCEPTIBILITIES TO FOLLOW Performed at [facility], [place] [address] [place], [place] [address]    Report Status PENDING  Incomplete         Radiology Studies: DG CHEST PORT 1 VIEW  Result Date: 12/25/2021 CLINICAL DATA:  Follow-up of multifocal pneumonia. EXAM: PORTABLE CHEST 1 VIEW COMPARISON:  December 22, 2021 FINDINGS: EKG leads project over the chest. Cardiomediastinal contours and hilar structures are stable. Lungs are clear. No pneumothorax. On limited assessment no acute skeletal process. IMPRESSION: No acute cardiopulmonary disease. Resolution of findings on previous imaging may reflect resolution of pulmonary edema or infection. Electronically Signed   By: Jewel Baize.D.  On: 12/25/2021 13:28        Scheduled Meds:  azithromycin  500 mg Oral Daily   clopidogrel  75 mg Oral Daily   escitalopram  20 mg Oral Daily   finasteride  5 mg Oral Daily   gabapentin  300 mg Oral QHS   guaiFENesin  600 mg Oral BID   heparin  5,000 Units Subcutaneous Q8H   insulin aspart  0-5 Units Subcutaneous QHS   insulin aspart  0-9 Units Subcutaneous TID WC   ipratropium-albuterol  3 mL Nebulization BID   nortriptyline  25 mg Oral Daily   pantoprazole  40 mg Oral Daily   rosuvastatin  10 mg Oral Daily   sodium bicarbonate  650 mg Oral Daily   tamsulosin  0.4 mg Oral Daily   Continuous Infusions:  sodium chloride 10 mL/hr at 12/24/21 1231   sodium chloride 85 mL/hr at 12/25/21 1809   cefTRIAXone (ROCEPHIN)  IV 1 g (12/25/21 1234)     LOS: 3 days    Time spent: 22 minutes    Kathie Dike, MD [facility]   If 7PM-7AM, please contact night-coverage [email or web address]  12/25/2021, 8:29 PM

## 2021-12-25 NOTE — Progress Notes (Signed)
Encourage pt to urinate last night, no urination during night time. Just did bladder scan 347m. Will report oncoming nurse.

## 2021-12-25 NOTE — Progress Notes (Signed)
Mobility Specialist - Progress Note  Pre-mobility: 70 bpm HR, 98% SpO2 Post-mobility: 73 bpm HR, 93% SPO2   12/25/21 1340  Oxygen Therapy  O2 Device Room Air  Mobility  Activity Transferred from bed to chair  Level of Assistance Moderate assist, patient does 50-74%  Assistive Device Front wheel walker  Range of Motion/Exercises Active  Activity Response Tolerated well  Mobility Referral Yes  $Mobility charge 1 Mobility   Pt was found in bed and agreeable to try ambulation. Upon sitting up stated having dizzy spells. Pt then agreed to sit in chair rather than trying ambulation. Upon attempting to get up from sit to stand was mod-A with a failed first attempt to get up. At EOS was left with necessities in reach and family in room. NT notified that pt was in chair.  Ferd Hibbs Mobility Specialist

## 2021-12-26 LAB — URINE CULTURE: Culture: >=100000 — AB

## 2021-12-26 LAB — RENAL FUNCTION PANEL
Albumin: 2.8 g/dL — ABNORMAL LOW (ref 3.5–5.0)
Anion gap: 13 (ref 5–15)
BUN: 98 mg/dL — ABNORMAL HIGH (ref 8–23)
CO2: 18 mmol/L — ABNORMAL LOW (ref 22–32)
Calcium: 7.9 mg/dL — ABNORMAL LOW (ref 8.9–10.3)
Chloride: 104 mmol/L (ref 98–111)
Creatinine, Ser: 6.29 mg/dL — ABNORMAL HIGH (ref 0.61–1.24)
GFR, Estimated: 9 mL/min — ABNORMAL LOW (ref >60)
Glucose, Bld: 145 mg/dL — ABNORMAL HIGH (ref 70–99)
Phosphorus: 5.9 mg/dL — ABNORMAL HIGH (ref 2.5–4.6)
Potassium: 3.9 mmol/L (ref 3.5–5.1)
Sodium: 135 mmol/L (ref 135–145)

## 2021-12-26 LAB — CBC
HCT: 24.8 % — ABNORMAL LOW (ref 39.0–52.0)
Hemoglobin: 7.9 g/dL — ABNORMAL LOW (ref 13.0–17.0)
MCH: 29.4 pg (ref 26.0–34.0)
MCHC: 31.9 g/dL (ref 30.0–36.0)
MCV: 92.2 fL (ref 80.0–100.0)
Platelets: 258 10*3/uL (ref 150–400)
RBC: 2.69 MIL/uL — ABNORMAL LOW (ref 4.22–5.81)
RDW: 16.1 % — ABNORMAL HIGH (ref 11.5–15.5)
WBC: 5.9 10*3/uL (ref 4.0–10.5)
nRBC: 0 % (ref 0.0–0.2)

## 2021-12-26 LAB — GLUCOSE, CAPILLARY
Glucose-Capillary: 121 mg/dL — ABNORMAL HIGH (ref 70–99)
Glucose-Capillary: 144 mg/dL — ABNORMAL HIGH (ref 70–99)
Glucose-Capillary: 106 mg/dL — ABNORMAL HIGH (ref 70–99)
Glucose-Capillary: 98 mg/dL (ref 70–99)
Glucose-Capillary: 129 mg/dL — ABNORMAL HIGH (ref 70–99)

## 2021-12-26 MED ORDER — DICLOFENAC SODIUM 1 % EX GEL
4.0000 g | Freq: Four times a day (QID) | CUTANEOUS | Status: DC
  Administered 2021-12-26 – 2021-12-29 (×14): 4 g via TOPICAL
  Filled 2021-12-26: qty 100

## 2021-12-26 MED ORDER — METHOCARBAMOL 500 MG PO TABS
500.0000 mg | ORAL_TABLET | Freq: Three times a day (TID) | ORAL | Status: DC | PRN
  Administered 2021-12-26: 500 mg via ORAL
  Filled 2021-12-26: qty 1

## 2021-12-26 NOTE — Progress Notes (Signed)
PROGRESS NOTE    Ryan Jenkins  CZY:[id number] DOB: [birth year] DOA: 12/22/2021 PCP: [name], MD    Brief Narrative:  61 year old male with a history of chronic kidney disease stage IV, diastolic heart failure, recently in the hospital for decompensated CHF and was released last month after significant diuresis, comes to the hospital with cough and shortness of breath.  X-ray shows multifocal pneumonia.   Started on IV antibiotics.  He was also noted to have worsening renal function.  Felt to be hypovolemic.  Started on IV fluids.  Nephrology following for renal issues.  He was noted to have urinary retention.  Had Foley catheter placed with coud catheter.  Discussed with his urologist who recommended continuing catheter on discharge until he can follow-up.   Assessment & Plan:   Principal Problem:   Multifocal pneumonia Active Problems:   Acute on chronic diastolic CHF (congestive heart failure) (HCC)   Type 2 diabetes mellitus with peripheral neuropathy (HCC)   Essential hypertension   Hyperlipidemia   Gastroparesis   Gastroesophageal reflux disease without esophagitis   Acute renal failure superimposed on stage 4 chronic kidney disease (HCC)   Coronary artery disease   BPH (benign prostatic hyperplasia)   Anemia in chronic kidney disease (CKD)   Multifocal pneumonia -On ceftriaxone and azithromycin -Continue pulmonary hygiene -Encourage flutter valve -strep pneumo antigen negative. Legionella antigen in process -oxygen requirements up to 11L, now has been weaned down to RA  Chronic diastolic congestive heart failure -Clinically, the patient does appear to be compensated -Current weight is similar to previous discharge weight when he was released after adequate diuresis.  He does not have significant peripheral edema -holding off on lasix for now since creatinine has trended up  AKI on CKD stage IV -Baseline creatinine approximately 4 -Creatinine on admission  noted to be 4.9 which has increased to 6.7 -appears to be low on volume, so would avoid diuresis.  Started on IV fluid challenge -Creatinine mildly improved today 6.2.  Plan is to continue gentle hydration -Nephrology following, appreciate input -Renal ultrasound negative for obstructive process  Normal anion gap metabolic acidosis -Likely related to renal failure -Started oral bicarb  Anemia chronic kidney disease -Current hemoglobin is 8.2, which trended down to 6.7 -Baseline hemoglobin appears to run between 8-9 -no reported bleeding -transfused 1 unit prbc with follow-up hemoglobin improving to 8.1 -Continue to monitor  Gastroparesis -Continue on Compazine  Insulin-dependent diabetes -Chronically on Lantus, currently on hold -Continue on sliding scale insulin -Overall blood sugars appear to be stable  Hyperlipidemia -Continue on statin  Hypertension -Chronically on amlodipine, hydralazine, metoprolol -holding all meds since his blood pressure is low normal and creatinine has trended up  ESBL Klebsiella UTI -Present on admission -Urine culture shows Klebsiella ESBL, similar to culture from 11/2021.  He does not have any dysuria at this time.  Suspect his bladder is colonized with this bacteria.  Would not actively treat at this point unless he becomes symptomatic or has fevers.  BPH with bladder outlet obstruction -Status post TURP 10/2021 -Noted to have postvoid residuals around 400 cc -Catheter placement with coud catheter performed -Discussed with his primary urologist, [name] who reported that patient has an areflexic bladder and he had mentioned to the patient that TURP may not help his urinary retention issues -He has recommended to continue catheter on discharge until he can follow-up with urology as an outpatient -Continue on Flomax and finasteride  DVT prophylaxis: heparin injection 5,000 Units Start:  12/22/21 2200  Code Status: Full code Family  Communication: Discussed with patient Disposition Plan: Status is: Inpatient Remains inpatient appropriate because: Continued management of renal failure and pneumonia     Consultants:  nephrology  Procedures:    Antimicrobials:  Ceftriaxone Azithromycin   Subjective: He continues to feel weak.  Does not really have much of a cough.  Overall shortness of breath is better.  Objective: Vitals:   12/25/21 0503 12/25/21 0919 12/25/21 1325 12/25/21 2003  BP: 139/77  134/73   Pulse: 70  72   Resp: (!) 21  20   Temp: 98 F (36.7 C)  98.1 F (36.7 C)   TempSrc: Oral  Oral   SpO2: 100% 99% 93% 97%  Weight:      Height:        Intake/Output Summary (Last 24 hours) at 12/25/2021 2029 Last data filed at 12/25/2021 1237 Gross per 24 hour  Intake 240.23 ml  Output 400 ml  Net -159.77 ml   Filed Weights   12/22/21 0632 12/25/21 0500  Weight: 93.4 kg 103 kg    Examination:  General exam: Appears calm and comfortable  Respiratory system: Clear to auscultation. Respiratory effort normal. Cardiovascular system: S1 & S2 heard, RRR. No JVD, murmurs, rubs, gallops or clicks. Gastrointestinal system: Abdomen is nondistended, soft and nontender. No organomegaly or masses felt. Normal bowel sounds heard. Central nervous system: Alert and oriented. No focal neurological deficits. Extremities: Symmetric 5 x 5 power.,  Starting to develop some lower extremity edema Skin: No rashes, lesions or ulcers Psychiatry: Judgement and insight appear normal. Mood & affect appropriate.     Data Reviewed: I have personally reviewed following labs and imaging studies  CBC: Recent Labs  Lab 12/22/21 0730 12/23/21 0444 12/24/21 1005 12/24/21 2324 12/25/21 0404  WBC 13.6* 12.4* 5.8  --  6.2  NEUTROABS 12.0*  --   --   --   --   HGB 9.1* 8.2* 6.7* 7.7* 8.1*  HCT 28.7* 25.8* 21.4* 24.4* 25.7*  MCV 92.0 91.5 93.0  --  92.1  PLT 296 253 219  --  277   Basic Metabolic Panel: Recent Labs   Lab 12/22/21 0730 12/23/21 0444 12/24/21 1005 12/25/21 0404  NA 136 138 137 135  K 3.6 3.6 3.9 4.2  CL 105 107 105 104  CO2 19* 18* 19* 17*  GLUCOSE 233* 193* 165* 149*  BUN 58* 66* 79* 93*  CREATININE 4.97* 5.43* 6.66* 6.74*  CALCIUM 8.5* 8.5* 8.2* 8.2*  PHOS  --   --   --  6.1*   GFR: Estimated Creatinine Clearance: 14.7 mL/min (A) (by C-G formula based on SCr of 6.74 mg/dL (H)). Liver Function Tests: Recent Labs  Lab 12/22/21 0730 12/23/21 0444 12/25/21 0404  AST 23 41  --   ALT 12 26  --   ALKPHOS 71 119  --   BILITOT 1.0 1.4*  --   PROT 8.2* 7.9  --   ALBUMIN 3.3* 3.2* 3.1*   Recent Labs  Lab 12/22/21 0730  LIPASE 28   No results for input(s): "AMMONIA" in the last 168 hours. Coagulation Profile: No results for input(s): "INR", "PROTIME" in the last 168 hours. Cardiac Enzymes: No results for input(s): "CKTOTAL", "CKMB", "CKMBINDEX", "TROPONINI" in the last 168 hours. BNP (last 3 results) No results for input(s): "PROBNP" in the last 8760 hours. HbA1C: No results for input(s): "HGBA1C" in the last 72 hours. CBG: Recent Labs  Lab 12/24/21 1650 12/24/21 2208 12/25/21  7829 12/25/21 1123 12/25/21 1637  GLUCAP 187* 181* 131* 132* 119*   Lipid Profile: No results for input(s): "CHOL", "HDL", "LDLCALC", "TRIG", "CHOLHDL", "LDLDIRECT" in the last 72 hours. Thyroid Function Tests: No results for input(s): "TSH", "T4TOTAL", "FREET4", "T3FREE", "THYROIDAB" in the last 72 hours. Anemia Panel: No results for input(s): "VITAMINB12", "FOLATE", "FERRITIN", "TIBC", "IRON", "RETICCTPCT" in the last 72 hours. Sepsis Labs: Recent Labs  Lab 12/22/21 0730  PROCALCITON 0.62    Recent Results (from the past 240 hour(s))  Resp Panel by RT-PCR (Flu A&B, Covid) Anterior Nasal Swab     Status: None   Collection Time: 12/22/21  7:30 AM   Specimen: Anterior Nasal Swab  Result Value Ref Range Status   SARS Coronavirus 2 by RT PCR NEGATIVE NEGATIVE Final    Comment:  (NOTE) SARS-CoV-2 target nucleic acids are NOT DETECTED.  The SARS-CoV-2 RNA is generally detectable in upper respiratory specimens during the acute phase of infection. The lowest concentration of SARS-CoV-2 viral copies this assay can detect is 138 copies/mL. A negative result does not preclude SARS-Cov-2 infection and should not be used as the sole basis for treatment or other patient management decisions. A negative result may occur with  improper specimen collection/handling, submission of specimen other than nasopharyngeal swab, presence of viral mutation(s) within the areas targeted by this assay, and inadequate number of viral copies(<138 copies/mL). A negative result must be combined with clinical observations, patient history, and epidemiological information. The expected result is Negative.  Fact Sheet for Patients:  EntrepreneurPulse.com.au  Fact Sheet for Healthcare Providers:  IncredibleEmployment.be  This test is no t yet approved or cleared by the [place] FDA and  has been authorized for detection and/or diagnosis of SARS-CoV-2 by FDA under an Emergency Use Authorization (EUA). This EUA will remain  in effect (meaning this test can be used) for the duration of the COVID-19 declaration under Section 564(b)(1) of the Act, 21 U.S.C.section 360bbb-3(b)(1), unless the authorization is terminated  or revoked sooner.       Influenza A by PCR NEGATIVE NEGATIVE Final   Influenza B by PCR NEGATIVE NEGATIVE Final    Comment: (NOTE) The Xpert Xpress SARS-CoV-2/FLU/RSV plus assay is intended as an aid in the diagnosis of influenza from Nasopharyngeal swab specimens and should not be used as a sole basis for treatment. Nasal washings and aspirates are unacceptable for Xpert Xpress SARS-CoV-2/FLU/RSV testing.  Fact Sheet for Patients: EntrepreneurPulse.com.au  Fact Sheet for Healthcare  Providers: IncredibleEmployment.be  This test is not yet approved or cleared by the [place] FDA and has been authorized for detection and/or diagnosis of SARS-CoV-2 by FDA under an Emergency Use Authorization (EUA). This EUA will remain in effect (meaning this test can be used) for the duration of the COVID-19 declaration under Section 564(b)(1) of the Act, 21 U.S.C. section 360bbb-3(b)(1), unless the authorization is terminated or revoked.  Performed at [facility], [place] [address], [place], [place] [address]   Urine Culture     Status: Abnormal (Preliminary result)   Collection Time: 12/22/21  7:30 AM   Specimen: Urine, Clean Catch  Result Value Ref Range Status   Specimen Description   Final    URINE, CLEAN CATCH Performed at [facility] [place] [address], [place], [place] [address]    Special Requests   Final    NONE Performed at [facility], [place] [address], [place], [place] [address]    Culture (A)  Final    >=  100,000 COLONIES/mL KLEBSIELLA PNEUMONIAE SUSCEPTIBILITIES TO FOLLOW Performed at [facility], [place] [address], [place], [place] [address]    Report Status PENDING  Incomplete         Radiology Studies: DG CHEST PORT 1 VIEW  Result Date: 12/25/2021 CLINICAL DATA:  Follow-up of multifocal pneumonia. EXAM: PORTABLE CHEST 1 VIEW COMPARISON:  December 22, 2021 FINDINGS: EKG leads project over the chest. Cardiomediastinal contours and hilar structures are stable. Lungs are clear. No pneumothorax. On limited assessment no acute skeletal process. IMPRESSION: No acute cardiopulmonary disease. Resolution of findings on previous imaging may reflect resolution of pulmonary edema or infection. Electronically Signed   By: Zetta Bills M.D.   On: 12/25/2021 13:28        Scheduled Meds:  azithromycin  500 mg Oral Daily   clopidogrel  75 mg Oral Daily   escitalopram  20 mg Oral Daily    finasteride  5 mg Oral Daily   gabapentin  300 mg Oral QHS   guaiFENesin  600 mg Oral BID   heparin  5,000 Units Subcutaneous Q8H   insulin aspart  0-5 Units Subcutaneous QHS   insulin aspart  0-9 Units Subcutaneous TID WC   ipratropium-albuterol  3 mL Nebulization BID   nortriptyline  25 mg Oral Daily   pantoprazole  40 mg Oral Daily   rosuvastatin  10 mg Oral Daily   sodium bicarbonate  650 mg Oral Daily   tamsulosin  0.4 mg Oral Daily   Continuous Infusions:  sodium chloride 10 mL/hr at 12/24/21 1231   sodium chloride 85 mL/hr at 12/25/21 1809   cefTRIAXone (ROCEPHIN)  IV 1 g (12/25/21 1234)     LOS: 3 days    Time spent: 70 minutes    Kathie Dike, MD [facility]   If 7PM-7AM, please contact night-coverage [email or web address]  12/25/2021, 8:29 PM

## 2021-12-26 NOTE — Progress Notes (Signed)
Mobility Specialist - Progress Note   12/26/21 1418  Mobility  Activity Ambulated with assistance in room  Level of Assistance +2 (takes two people)  Assistive Device Front wheel walker  Distance Ambulated (ft) 15 ft  Activity Response Tolerated fair  $Mobility charge 1 Mobility   Pt was found on recliner chair and agreeable to ambulate. Pt attempt to get up multiple times from recliner chair and failed. Pt was +2 to get up with NT assistance and was min-A to steady during ambulation. Pt c/o pain on his lower back going to his hips and down his legs. Pt took x2 standing breaks due to pain and x1 seated rest break. Afterwards NT helped with assisting pt back to bed. Pt was left in bed with NT in room.  Ferd Hibbs Mobility Specialist

## 2021-12-26 NOTE — Progress Notes (Addendum)
[facility] Progress Note  Subjective: no c/o's. 700 cc UOP yest, creat 6.29 today. Still being bad, fatigue and having cramping pains in his hips and legs.   Vitals:   12/25/21 2003 12/25/21 2045 12/26/21 0500 12/26/21 0520  BP:  (!) 147/64  (!) 159/78  Pulse:  74  77  Resp:  16  17  Temp:  98.3 F (36.8 C)  98.9 F (37.2 C)  TempSrc:  Oral  Oral  SpO2: 97% 98%  97%  Weight:   105.4 kg   Height:        Exam: Gen: looks tired but not in distress Neck: no JVD CV:  RRR, no rub Abd:  soft, nontender Lungs: basilar rhonchi, normal WOB GU: no foley Extr: no edema Neuro: nonfocal   Assessment/ Plan: AKI on CKD 4 - b/l creatinine 4.0, eGFR ~20.  Creat here was 4.9 on admit, then worsened up to 5.4 and 6.6 on 12/10. UCx grew klebsiella. Renal US 10/2021 was ok and was not repeated. Pt appeared dry and BP was lower than typical, we suspected prerenal/hypovolemia potentially progressed to ATN. Diuretics were held, pt was transfused and IVF"s were given. Also foley was placed for urine retention (per the patient). Creat stabilized at 6.7 yesterday then dropped today to 6.2. He still has some symptoms which may be uremia-related (fatigue, no appetite). Exam still appears euvolemic, will lower IVF"s to 65 cc/hr. Cont to follow and hopefully renal function will cont to improve towards his baseline.  CAP complicated by hypoxic respiratory failure- on admission CXR, CAP tx per primary; afebrile now and leukocytosis resolved. F/u CXR showed no residual infiltrates.  Klebsiella UTI - on admit, per pmd BPH/ BOO - pt had TURP in October 2023. Was not voiding well and coude catheter was placed. Urology said his bladder function was poor and that the TURP may not help this.  Anemia - baseline 9, then 6.7 , then SP pRBC's. Hb 7.9 now. Transfuse prn.  HTN - bp's are good, 140/80 range.  Metabolic acidosis - mild, is on oral bicarb Combined CHF - had 2 admissions in 10/2021 for overload,  but this time looked dry. DM: per primary - decreased gabapentin to 300 qhs from BID given GFR.   Rob Ephram Kornegay 12/26/2021, 8:50 AM   Recent Labs  Lab 12/25/21 0404 12/26/21 0346  HGB 8.1* 7.9*  ALBUMIN 3.1* 2.8*  CALCIUM 8.2* 7.9*  PHOS 6.1* 5.9*  CREATININE 6.74* 6.29*  K 4.2 3.9    No results for input(s): "IRON", "TIBC", "FERRITIN" in the last 168 hours. Inpatient medications:  azithromycin  500 mg Oral Daily   clopidogrel  75 mg Oral Daily   diclofenac Sodium  4 g Topical QID   escitalopram  20 mg Oral Daily   finasteride  5 mg Oral Daily   gabapentin  300 mg Oral QHS   guaiFENesin  600 mg Oral BID   heparin  5,000 Units Subcutaneous Q8H   insulin aspart  0-5 Units Subcutaneous QHS   insulin aspart  0-9 Units Subcutaneous TID WC   ipratropium-albuterol  3 mL Nebulization BID   nortriptyline  25 mg Oral Daily   pantoprazole  40 mg Oral Daily   rosuvastatin  10 mg Oral Daily   sodium bicarbonate  650 mg Oral Daily   tamsulosin  0.4 mg Oral Daily    sodium chloride 10 mL/hr at 12/24/21 1231   sodium chloride 85 mL/hr at 12/26/21 0303   cefTRIAXone (ROCEPHIN)  IV Stopped (12/25/21 2051)   sodium chloride, acetaminophen **OR** acetaminophen, mouth rinse, oxyCODONE-acetaminophen, prochlorperazine

## 2021-12-27 LAB — RENAL FUNCTION PANEL
Albumin: 2.6 g/dL — ABNORMAL LOW (ref 3.5–5.0)
Anion gap: 12 (ref 5–15)
BUN: 88 mg/dL — ABNORMAL HIGH (ref 8–23)
CO2: 18 mmol/L — ABNORMAL LOW (ref 22–32)
Calcium: 8.2 mg/dL — ABNORMAL LOW (ref 8.9–10.3)
Chloride: 106 mmol/L (ref 98–111)
Creatinine, Ser: 6.04 mg/dL — ABNORMAL HIGH (ref 0.61–1.24)
GFR, Estimated: 10 mL/min — ABNORMAL LOW (ref >60)
Glucose, Bld: 115 mg/dL — ABNORMAL HIGH (ref 70–99)
Phosphorus: 6.5 mg/dL — ABNORMAL HIGH (ref 2.5–4.6)
Potassium: 3.9 mmol/L (ref 3.5–5.1)
Sodium: 136 mmol/L (ref 135–145)

## 2021-12-27 LAB — CBC
HCT: 25.2 % — ABNORMAL LOW (ref 39.0–52.0)
Hemoglobin: 7.9 g/dL — ABNORMAL LOW (ref 13.0–17.0)
MCH: 29 pg (ref 26.0–34.0)
MCHC: 31.3 g/dL (ref 30.0–36.0)
MCV: 92.6 fL (ref 80.0–100.0)
Platelets: 274 10*3/uL (ref 150–400)
RBC: 2.72 MIL/uL — ABNORMAL LOW (ref 4.22–5.81)
RDW: 15.9 % — ABNORMAL HIGH (ref 11.5–15.5)
WBC: 5.4 10*3/uL (ref 4.0–10.5)
nRBC: 0 % (ref 0.0–0.2)

## 2021-12-27 LAB — LEGIONELLA PNEUMOPHILA SEROGP 1 UR AG: L. pneumophila Serogp 1 Ur Ag: NEGATIVE

## 2021-12-27 LAB — GLUCOSE, CAPILLARY
Glucose-Capillary: 109 mg/dL — ABNORMAL HIGH (ref 70–99)
Glucose-Capillary: 114 mg/dL — ABNORMAL HIGH (ref 70–99)
Glucose-Capillary: 124 mg/dL — ABNORMAL HIGH (ref 70–99)
Glucose-Capillary: 149 mg/dL — ABNORMAL HIGH (ref 70–99)

## 2021-12-27 MED ORDER — CHLORHEXIDINE GLUCONATE CLOTH 2 % EX PADS
6.0000 | MEDICATED_PAD | Freq: Every day | CUTANEOUS | Status: DC
  Administered 2021-12-27 – 2021-12-30 (×4): 6 via TOPICAL

## 2021-12-27 NOTE — Progress Notes (Addendum)
PROGRESS NOTE  Ryan Jenkins  DGL:[id number] DOB: 1960/09/24 DOA: 12/22/2021 PCP: [name], MD   Brief Narrative: 61 year old male with a history of chronic kidney disease stage IV, diastolic heart failure, recently in the hospital for decompensated CHF and was released last month after significant diuresis, comes to the hospital with cough and shortness of breath.  X-ray shows multifocal pneumonia.   Started on IV antibiotics.  He was also noted to have worsening renal function.  Felt to be hypovolemic.  Started on IV fluids.  Nephrology following for renal issues.  He was noted to have urinary retention.  Had Foley catheter placed with coud catheter.  Discussed with his urologist who recommended continuing catheter on discharge until he can follow-up.  Creatinine has plateaued in the range of 6, nephrology recommending to continue IV fluids because he appears euvolemic.  Assessment & Plan:  Principal Problem:   Multifocal pneumonia Active Problems:   Acute on chronic diastolic CHF (congestive heart failure) (HCC)   Type 2 diabetes mellitus with peripheral neuropathy (HCC)   Essential hypertension   Hyperlipidemia   Gastroparesis   Gastroesophageal reflux disease without esophagitis   Acute renal failure superimposed on stage 4 chronic kidney disease (HCC)   Coronary artery disease   BPH (benign prostatic hyperplasia)   Anemia in chronic kidney disease (CKD)   Multifocal pneumonia -On ceftriaxone and azithromycin,completed course -Continue pulmonary hygiene -Encourage flutter valve -strep pneumo antigen negative. Legionella antigen negative -oxygen requirements up to 11L, now has been weaned down to RA  AKI on CKD stage IV -Baseline creatinine approximately 4 -Creatinine on admission noted to be 4.9 which has increased to 6.7 -  Plan is to continue gentle hydration -Nephrology following, appreciate input -Renal ultrasound negative for obstructive process   Chronic  diastolic congestive heart failure -Clinically, the patient does appear to be compensated  He does not have significant peripheral edema -Lasix held    Normal anion gap metabolic acidosis -Likely related to renal failure -Started oral bicarb   Anemia chronic kidney disease -Current hemoglobin in the range of 7-8 -Baseline hemoglobin appears to run between 8-9 -no reported bleeding -transfused with 1 unit of prbc with follow-up hemoglobin improved to the range of 8 during this hospitalization   Gastroparesis -Continue on Compazine   Insulin-dependent diabetes -Chronically on Lantus, currently on hold -Continue on sliding scale insulin -Overall blood sugars appear to be stable   Hyperlipidemia -Continue on statin   Hypertension -Chronically on amlodipine, hydralazine, metoprolol -holding all meds    ESBL Klebsiella UTI -Present on admission -Urine culture shows Klebsiella ESBL, similar to culture from 11/2021.  He does not have any dysuria at this time.  Suspect his bladder is colonized with this bacteria. No indication for treatment unless he becomes symptomatic or has fevers.   BPH with bladder outlet obstruction -Status post TURP 10/2021 -Noted to have postvoid residuals around 400 cc -Catheter placement with coud catheter performed -Discussed with his primary urologist, [name] who reported that patient has an areflexic bladder and he had mentioned to the patient that TURP may not help his urinary retention issues -He has recommended to continue catheter on discharge until he can follow-up with urology as an outpatient -Continue on Flomax and finasteride        DVT prophylaxis:heparin injection 5,000 Units Start: 12/22/21 2200     Code Status: Full Code  Family Communication: Brother at bedside  Patient status:Inpatient  Patient is from :Home  Anticipated discharge JJ:OACZ  Estimated DC date:after further improvement in renal function   Consultants:  Nephrology,urology  Procedures:None  Antimicrobials:  Anti-infectives (From admission, onward)    Start     Dose/Rate Route Frequency Ordered Stop   12/25/21 1000  azithromycin (ZITHROMAX) tablet 500 mg        500 mg Oral Daily 12/25/21 0721     12/22/21 1145  azithromycin (ZITHROMAX) 500 mg in sodium chloride 0.9 % 250 mL IVPB  Status:  Discontinued        500 mg 250 mL/hr over 60 Minutes Intravenous Every 24 hours 12/22/21 1131 12/25/21 0720   12/22/21 1145  cefTRIAXone (ROCEPHIN) 1 g in sodium chloride 0.9 % 100 mL IVPB        1 g 200 mL/hr over 30 Minutes Intravenous Every 24 hours 12/22/21 1131         Subjective: Patient seen and examined at bedside today.  Sitting in the chair.  Hemodynamically stable.  Comfortable.  Denies shortness of breath or cough.  Brother at bedside.  No new complaints .on room air  Objective: Vitals:   12/27/21 0428 12/27/21 0800 12/27/21 0929 12/27/21 1442  BP:    (!) 172/90  Pulse:    87  Resp:    18  Temp:    98.1 F (36.7 C)  TempSrc:    Oral  SpO2:  98% 99% 98%  Weight: 106.8 kg     Height:        Intake/Output Summary (Last 24 hours) at 12/27/2021 1524 Last data filed at 12/27/2021 1437 Gross per 24 hour  Intake 540 ml  Output 2550 ml  Net -2010 ml   Filed Weights   12/25/21 0500 12/26/21 0500 12/27/21 0428  Weight: 103 kg 105.4 kg 106.8 kg    Examination:  General exam: Overall comfortable, not in distress HEENT: PERRL Respiratory system:  no wheezes or crackles  Cardiovascular system: S1 & S2 heard, RRR.  Gastrointestinal system: Abdomen is nondistended, soft and nontender. Central nervous system: Alert and oriented Extremities: bilateral lower extremity  edema, no clubbing ,no cyanosis Skin: No rashes, no ulcers,no icterus     Data Reviewed: I have personally reviewed following labs and imaging studies  CBC: Recent Labs  Lab 12/22/21 0730 12/23/21 0444 12/24/21 1005 12/24/21 2324 12/25/21 0404  12/26/21 0346 12/27/21 0409  WBC 13.6* 12.4* 5.8  --  6.2 5.9 5.4  NEUTROABS 12.0*  --   --   --   --   --   --   HGB 9.1* 8.2* 6.7* 7.7* 8.1* 7.9* 7.9*  HCT 28.7* 25.8* 21.4* 24.4* 25.7* 24.8* 25.2*  MCV 92.0 91.5 93.0  --  92.1 92.2 92.6  PLT 296 253 219  --  242 258 568   Basic Metabolic Panel: Recent Labs  Lab 12/23/21 0444 12/24/21 1005 12/25/21 0404 12/26/21 0346 12/27/21 0409  NA 138 137 135 135 136  K 3.6 3.9 4.2 3.9 3.9  CL 107 105 104 104 106  CO2 18* 19* 17* 18* 18*  GLUCOSE 193* 165* 149* 145* 115*  BUN 66* 79* 93* 98* 88*  CREATININE 5.43* 6.66* 6.74* 6.29* 6.04*  CALCIUM 8.5* 8.2* 8.2* 7.9* 8.2*  PHOS  --   --  6.1* 5.9* 6.5*     Recent Results (from the past 240 hour(s))  Resp Panel by RT-PCR (Flu A&B, Covid) Anterior Nasal Swab     Status: None   Collection Time: 12/22/21  7:30 AM   Specimen: Anterior Nasal Swab  Result Value Ref Range Status   SARS Coronavirus 2 by RT PCR NEGATIVE NEGATIVE Final    Comment: (NOTE) SARS-CoV-2 target nucleic acids are NOT DETECTED.  The SARS-CoV-2 RNA is generally detectable in upper respiratory specimens during the acute phase of infection. The lowest concentration of SARS-CoV-2 viral copies this assay can detect is 138 copies/mL. A negative result does not preclude SARS-Cov-2 infection and should not be used as the sole basis for treatment or other patient management decisions. A negative result may occur with  improper specimen collection/handling, submission of specimen other than nasopharyngeal swab, presence of viral mutation(s) within the areas targeted by this assay, and inadequate number of viral copies(<138 copies/mL). A negative result must be combined with clinical observations, patient history, and epidemiological information. The expected result is Negative.  Fact Sheet for Patients:  EntrepreneurPulse.com.au  Fact Sheet for Healthcare Providers:   IncredibleEmployment.be  This test is no t yet approved or cleared by the [place] FDA and  has been authorized for detection and/or diagnosis of SARS-CoV-2 by FDA under an Emergency Use Authorization (EUA). This EUA will remain  in effect (meaning this test can be used) for the duration of the COVID-19 declaration under Section 564(b)(1) of the Act, 21 U.S.C.section 360bbb-3(b)(1), unless the authorization is terminated  or revoked sooner.       Influenza A by PCR NEGATIVE NEGATIVE Final   Influenza B by PCR NEGATIVE NEGATIVE Final    Comment: (NOTE) The Xpert Xpress SARS-CoV-2/FLU/RSV plus assay is intended as an aid in the diagnosis of influenza from Nasopharyngeal swab specimens and should not be used as a sole basis for treatment. Nasal washings and aspirates are unacceptable for Xpert Xpress SARS-CoV-2/FLU/RSV testing.  Fact Sheet for Patients: EntrepreneurPulse.com.au  Fact Sheet for Healthcare Providers: IncredibleEmployment.be  This test is not yet approved or cleared by the [place] FDA and has been authorized for detection and/or diagnosis of SARS-CoV-2 by FDA under an Emergency Use Authorization (EUA). This EUA will remain in effect (meaning this test can be used) for the duration of the COVID-19 declaration under Section 564(b)(1) of the Act, 21 U.S.C. section 360bbb-3(b)(1), unless the authorization is terminated or revoked.  Performed at [facility], [place] [address], [place], [place] [address]   Urine Culture     Status: Abnormal   Collection Time: 12/22/21  7:30 AM   Specimen: Urine, Clean Catch  Result Value Ref Range Status   Specimen Description   Final    URINE, CLEAN CATCH Performed at [facility], [place] [address], [place], [place] [address]    Special Requests   Final    NONE Performed at [facility], [place] [address],  [place], [place] [address]    Culture (A)  Final    >=100,000 COLONIES/mL KLEBSIELLA PNEUMONIAE Confirmed Extended Spectrum Beta-Lactamase Producer (ESBL).  In bloodstream infections from ESBL organisms, carbapenems are preferred over piperacillin/tazobactam. They are shown to have a lower risk of mortality.    Report Status 12/26/2021 FINAL  Final   Organism ID, Bacteria KLEBSIELLA PNEUMONIAE (A)  Final      Susceptibility   Klebsiella pneumoniae - MIC*    AMPICILLIN >=32 RESISTANT Resistant     CEFAZOLIN >=64 RESISTANT Resistant     CEFEPIME >=32 RESISTANT Resistant     CEFTRIAXONE >=64 RESISTANT Resistant     CIPROFLOXACIN >=4 RESISTANT Resistant     GENTAMICIN <=1 SENSITIVE Sensitive     IMIPENEM <=0.25 SENSITIVE Sensitive  NITROFURANTOIN 64 INTERMEDIATE Intermediate     TRIMETH/SULFA >=320 RESISTANT Resistant     AMPICILLIN/SULBACTAM >=32 RESISTANT Resistant     PIP/TAZO 16 SENSITIVE Sensitive     * >=100,000 COLONIES/mL KLEBSIELLA PNEUMONIAE     Radiology Studies: No results found.  Scheduled Meds:  azithromycin  500 mg Oral Daily   clopidogrel  75 mg Oral Daily   diclofenac Sodium  4 g Topical QID   escitalopram  20 mg Oral Daily   finasteride  5 mg Oral Daily   gabapentin  300 mg Oral QHS   guaiFENesin  600 mg Oral BID   heparin  5,000 Units Subcutaneous Q8H   insulin aspart  0-5 Units Subcutaneous QHS   insulin aspart  0-9 Units Subcutaneous TID WC   ipratropium-albuterol  3 mL Nebulization BID   nortriptyline  25 mg Oral Daily   pantoprazole  40 mg Oral Daily   rosuvastatin  10 mg Oral Daily   sodium bicarbonate  650 mg Oral Daily   tamsulosin  0.4 mg Oral Daily   Continuous Infusions:  sodium chloride 10 mL/hr at 12/24/21 1231   sodium chloride 75 mL/hr at 12/27/21 1301   cefTRIAXone (ROCEPHIN)  IV 1 g (12/27/21 1149)     LOS: 5 days   Shelly Coss, MD [facility] P12/13/2023, 3:24 PM

## 2021-12-27 NOTE — Progress Notes (Signed)
Mobility Specialist - Progress Note   12/27/21 1442  Mobility  Activity Ambulated with assistance in room  Level of Assistance Contact guard assist, steadying assist  Assistive Device Front wheel walker  Distance Ambulated (ft) 15 ft  Range of Motion/Exercises Active  Activity Response Tolerated well  $Mobility charge 1 Mobility   Pt was found in bed and agreeable to ambulate in room to recliner chair. Pt stated feeling okay during session but still feeling weak. At EOS was left on recliner chair with NT in room.  Ferd Hibbs Mobility Specialist

## 2021-12-27 NOTE — Progress Notes (Signed)
[facility] Progress Note  Subjective: no c/o's. 1200 cc UOP yest and creat down to 6.0 from 6.3 yesterday. Neck cramps / back pains have improved. Nausea is now off and on no longer steady. Still "weak" and needs help moving around.   Vitals:   12/27/21 0419 12/27/21 0428 12/27/21 0800 12/27/21 0929  BP: (!) 157/91     Pulse: 76     Resp: 18     Temp: 97.9 F (36.6 C)     TempSrc: Oral     SpO2: 98%  98% 99%  Weight:  106.8 kg    Height:        Exam: Gen: looks tired but not in distress Neck: no JVD CV:  RRR, no rub Abd:  soft, nontender Lungs: basilar rhonchi, normal WOB GU: no foley Extr: no edema Neuro: nonfocal, no asterixis   Assessment/ Plan: AKI on CKD 4 - b/l creatinine 4.0, eGFR ~20.  Creat here was 4.9 on admit, then worsened up to 5.4 --> then 6.6 on 12/10. UCx grew klebsiella. Renal US October 2023 was ok and was not repeated. Pt appeared dry and BP was lower than typical, we suspected prerenal/hypovolemia potentially progressed to ATN. Diuretics were held, pt was transfused and IVF"s were given. Also foley was placed for urine retention (per the patient). Creat stabilized at 6.7 yesterday then dropped today to 6.2. He still has some symptoms which may be uremia-related (fatigue, no appetite). Exam still appears euvolemic, will cont IVF"s at 75 cc/hr. Uremic symptoms are improving now and pt starting to feel better. Still weak, PT working w/ pt.  CAP complicated by hypoxic respiratory failure- on admission CXR, CAP tx per primary; afebrile now and leukocytosis resolved. F/u CXR showed no residual infiltrates.  Klebsiella UTI - on admit, per pmd BPH/ BOO - pt had TURP in October 2023. Was not voiding well and coude catheter was placed. Urology said his bladder function was poor prior to TURP and that the surgery may not correct this.  Anemia - baseline 9, then 6.7 , then SP pRBC's. Hb 7.9 now. Transfuse prn.  HTN - bp's are good, 140/80 range.  Metabolic  acidosis - mild, is on oral bicarb H/o combined s/d CHF DM: per primary - decreased gabapentin to 300 qhs from BID d/t low gfr   Rob Santrice Muzio 12/27/2021, 12:35 PM   Recent Labs  Lab 12/26/21 0346 12/27/21 0409  HGB 7.9* 7.9*  ALBUMIN 2.8* 2.6*  CALCIUM 7.9* 8.2*  PHOS 5.9* 6.5*  CREATININE 6.29* 6.04*  K 3.9 3.9    No results for input(s): "IRON", "TIBC", "FERRITIN" in the last 168 hours. Inpatient medications:  azithromycin  500 mg Oral Daily   clopidogrel  75 mg Oral Daily   diclofenac Sodium  4 g Topical QID   escitalopram  20 mg Oral Daily   finasteride  5 mg Oral Daily   gabapentin  300 mg Oral QHS   guaiFENesin  600 mg Oral BID   heparin  5,000 Units Subcutaneous Q8H   insulin aspart  0-5 Units Subcutaneous QHS   insulin aspart  0-9 Units Subcutaneous TID WC   ipratropium-albuterol  3 mL Nebulization BID   nortriptyline  25 mg Oral Daily   pantoprazole  40 mg Oral Daily   rosuvastatin  10 mg Oral Daily   sodium bicarbonate  650 mg Oral Daily   tamsulosin  0.4 mg Oral Daily    sodium chloride 10 mL/hr at 12/24/21 1231  sodium chloride 65 mL/hr at 12/27/21 0852   cefTRIAXone (ROCEPHIN)  IV 1 g (12/27/21 1149)   sodium chloride, acetaminophen **OR** acetaminophen, methocarbamol, mouth rinse, oxyCODONE-acetaminophen, prochlorperazine

## 2021-12-27 NOTE — TOC Progression Note (Signed)
Transition of Care [facility]) - Progression Note    Patient Details  Name: Ryan Jenkins MRN: [id number] Date of Birth: [birth year]  Transition of Care [facility]) CM/SW [place], LCSW Phone Number: 12/27/2021, 10:03 AM  Clinical Narrative:     [facility] following will need new orders for HH/PT prior to d/c .   Expected Discharge Plan: Home/Self Care Barriers to Discharge: Continued Medical Work up  Expected Discharge Plan and Services Expected Discharge Plan: Home/Self Care   Discharge Planning Services: CM Consult   Living arrangements for the past 2 months: Apartment                                       Social Determinants of Health (SDOH) Interventions Food Insecurity Interventions: Intervention Not Indicated Housing Interventions: Intervention Not Indicated Transportation Interventions: Intervention Not Indicated Utilities Interventions: Intervention Not Indicated  Readmission Risk Interventions    11/29/2021    4:16 PM  Readmission Risk Prevention Plan  Transportation Screening Complete  Medication Review (RN Care Manager) Complete  HRI or [place] Complete  SW Recovery Care/Counseling Consult Complete  Palliative Care Screening Not [place] Not Applicable

## 2021-12-28 LAB — GLUCOSE, CAPILLARY
Glucose-Capillary: 126 mg/dL — ABNORMAL HIGH (ref 70–99)
Glucose-Capillary: 143 mg/dL — ABNORMAL HIGH (ref 70–99)
Glucose-Capillary: 143 mg/dL — ABNORMAL HIGH (ref 70–99)
Glucose-Capillary: 95 mg/dL (ref 70–99)
Glucose-Capillary: 107 mg/dL — ABNORMAL HIGH (ref 70–99)

## 2021-12-28 LAB — CBC
HCT: 26 % — ABNORMAL LOW (ref 39.0–52.0)
Hemoglobin: 8.2 g/dL — ABNORMAL LOW (ref 13.0–17.0)
MCH: 29.1 pg (ref 26.0–34.0)
MCHC: 31.5 g/dL (ref 30.0–36.0)
MCV: 92.2 fL (ref 80.0–100.0)
Platelets: 322 10*3/uL (ref 150–400)
RBC: 2.82 MIL/uL — ABNORMAL LOW (ref 4.22–5.81)
RDW: 15.4 % (ref 11.5–15.5)
WBC: 6 10*3/uL (ref 4.0–10.5)
nRBC: 0 % (ref 0.0–0.2)

## 2021-12-28 LAB — RENAL FUNCTION PANEL
Albumin: 2.7 g/dL — ABNORMAL LOW (ref 3.5–5.0)
Anion gap: 9 (ref 5–15)
BUN: 79 mg/dL — ABNORMAL HIGH (ref 8–23)
CO2: 18 mmol/L — ABNORMAL LOW (ref 22–32)
Calcium: 8.5 mg/dL — ABNORMAL LOW (ref 8.9–10.3)
Chloride: 111 mmol/L (ref 98–111)
Creatinine, Ser: 4.76 mg/dL — ABNORMAL HIGH (ref 0.61–1.24)
GFR, Estimated: 13 mL/min — ABNORMAL LOW (ref >60)
Glucose, Bld: 142 mg/dL — ABNORMAL HIGH (ref 70–99)
Phosphorus: 5.2 mg/dL — ABNORMAL HIGH (ref 2.5–4.6)
Potassium: 3.7 mmol/L (ref 3.5–5.1)
Sodium: 138 mmol/L (ref 135–145)

## 2021-12-28 MED ORDER — AMLODIPINE BESYLATE 10 MG PO TABS
10.0000 mg | ORAL_TABLET | Freq: Every day | ORAL | Status: DC
  Administered 2021-12-28 – 2021-12-30 (×3): 10 mg via ORAL
  Filled 2021-12-28 (×3): qty 1

## 2021-12-28 MED ORDER — HYDRALAZINE HCL 20 MG/ML IJ SOLN
10.0000 mg | Freq: Once | INTRAMUSCULAR | Status: AC
  Administered 2021-12-28: 10 mg via INTRAVENOUS
  Filled 2021-12-28: qty 1

## 2021-12-28 MED ORDER — IPRATROPIUM-ALBUTEROL 0.5-2.5 (3) MG/3ML IN SOLN: 3.0000 mL | Freq: Four times a day (QID) | RESPIRATORY_TRACT | Status: DC | PRN

## 2021-12-28 MED ORDER — HYDRALAZINE HCL 50 MG PO TABS
50.0000 mg | ORAL_TABLET | Freq: Three times a day (TID) | ORAL | Status: DC
  Administered 2021-12-28 – 2021-12-29 (×4): 50 mg via ORAL
  Filled 2021-12-28 (×4): qty 1

## 2021-12-28 NOTE — Progress Notes (Signed)
PROGRESS NOTE  Ryan Jenkins  QPR:[id number] DOB: September 20, 1960 DOA: 12/22/2021 PCP: [name], MD   Brief Narrative: 61 year old male with a history of chronic kidney disease stage IV, diastolic heart failure, recently in the hospital for decompensated CHF and was released last month after significant diuresis, comes to the hospital with cough and shortness of breath.  X-ray shows multifocal pneumonia.   Started on IV antibiotics.  He was also noted to have worsening renal function.  Felt to be hypovolemic.  Started on IV fluids.  Nephrology following for renal issues.  He was noted to have urinary retention.  Had Foley catheter placed with coud catheter.  Discussed with his urologist who recommended continuing catheter on discharge until he can follow-up.  Creatinine has improved with iv fluids,now stopped.  PT recommending SNF on discharge  Assessment & Plan:  Principal Problem:   Multifocal pneumonia Active Problems:   Acute on chronic diastolic CHF (congestive heart failure) (HCC)   Type 2 diabetes mellitus with peripheral neuropathy (HCC)   Essential hypertension   Hyperlipidemia   Gastroparesis   Gastroesophageal reflux disease without esophagitis   Acute renal failure superimposed on stage 4 chronic kidney disease (HCC)   Coronary artery disease   BPH (benign prostatic hyperplasia)   Anemia in chronic kidney disease (CKD)   Multifocal pneumonia -On ceftriaxone and azithromycin,completed course -Continue pulmonary hygiene -Encourage flutter valve -strep pneumo antigen negative. Legionella antigen negative -oxygen requirements up to 11L, now has been weaned down to RA  AKI on CKD stage IV -Baseline creatinine approximately 4 -Creatinine on admission noted to be 4.9 , now improved back to the range of 4 -IV fluids stopped -Nephrology following, appreciate input -Renal ultrasound negative for obstructive process   Chronic diastolic congestive heart  failure -Clinically, the patient does appear to be compensated  He does not have significant peripheral edema -Lasix held    Normal anion gap metabolic acidosis -Likely related to renal failure -Started oral bicarb   Anemia chronic kidney disease -Current hemoglobin in the range of 7-8 -Baseline hemoglobin appears to run between 8-9 -no reported bleeding -transfused with 1 unit of prbc with follow-up hemoglobin improved to the range of 8 during this hospitalization   Gastroparesis -Continue on Compazine   Insulin-dependent diabetes -Chronically on Lantus, currently on hold -Continue on sliding scale insulin -Overall blood sugars appear to be stable   Hyperlipidemia -Continue on statin   Hypertension -Chronically on amlodipine, hydralazine, metoprolol -Blood pressure started creeping up so was resumed on amlodipine, hydralazine   ESBL Klebsiella UTI -Present on admission -Urine culture shows Klebsiella ESBL, similar to culture from 11/2021.  He does not have any dysuria at this time.  Suspect his bladder is colonized with this bacteria. No indication for treatment unless he becomes symptomatic or has fevers.   BPH with bladder outlet obstruction -Status post TURP 10/2021 -Noted to have postvoid residuals around 400 cc -Catheter placement with coud catheter performed -Discussed with his primary urologist, [name] who reported that patient has an areflexic bladder and he had mentioned to the patient that TURP may not help his urinary retention issues -He has recommended to continue catheter on discharge until he can follow-up with urology as an outpatient -Continue on Flomax and finasteride        DVT prophylaxis:heparin injection 5,000 Units Start: 12/22/21 2200     Code Status: Full Code  Family Communication: Brother at bedside on 12/13  Patient status:Inpatient  Patient is from :Home  Anticipated discharge DJ:[facility]  Estimated DC date:after further  improvement in renal function   Consultants: Nephrology,urology  Procedures:None  Antimicrobials:  Anti-infectives (From admission, onward)    Start     Dose/Rate Route Frequency Ordered Stop   12/25/21 1000  azithromycin (ZITHROMAX) tablet 500 mg  Status:  Discontinued        500 mg Oral Daily 12/25/21 0721 12/27/21 1622   12/22/21 1145  azithromycin (ZITHROMAX) 500 mg in sodium chloride 0.9 % 250 mL IVPB  Status:  Discontinued        500 mg 250 mL/hr over 60 Minutes Intravenous Every 24 hours 12/22/21 1131 12/25/21 0720   12/22/21 1145  cefTRIAXone (ROCEPHIN) 1 g in sodium chloride 0.9 % 100 mL IVPB  Status:  Discontinued        1 g 200 mL/hr over 30 Minutes Intravenous Every 24 hours 12/22/21 1131 12/27/21 1622       Subjective: Patient seen and examined at bedside today.  Hemodynamically stable.  Overall comfortable, though appears weak.  Kidney function is improved.  Not in any acute respiratory distress.  Denies any abdominal pain, nausea or vomiting.  Still has bilateral lower extremity edema  Objective: Vitals:   12/28/21 0546 12/28/21 0602 12/28/21 0653 12/28/21 0912  BP:  (!) 177/88 (!) 163/77   Pulse:  81 84   Resp:  18    Temp:  97.9 F (36.6 C)    TempSrc:  Oral    SpO2:  98%  98%  Weight: 108.1 kg     Height:        Intake/Output Summary (Last 24 hours) at 12/28/2021 1416 Last data filed at 12/28/2021 0500 Gross per 24 hour  Intake 3474.43 ml  Output 2900 ml  Net 574.43 ml   Filed Weights   12/26/21 0500 12/27/21 0428 12/28/21 0546  Weight: 105.4 kg 106.8 kg 108.1 kg    Examination: General exam: Overall comfortable, not in distress,obese,weak appearing HEENT: PERRL Respiratory system:  no wheezes or crackles  Cardiovascular system: S1 & S2 heard, RRR.  Gastrointestinal system: Abdomen is nondistended, soft and nontender. Central nervous system: Alert and oriented Extremities: LE edema, no clubbing ,no cyanosis Skin: No rashes, no ulcers,no  icterus     Data Reviewed: I have personally reviewed following labs and imaging studies  CBC: Recent Labs  Lab 12/22/21 0730 12/23/21 0444 12/24/21 1005 12/24/21 2324 12/25/21 0404 12/26/21 0346 12/27/21 0409 12/28/21 0423  WBC 13.6*   < > 5.8  --  6.2 5.9 5.4 6.0  NEUTROABS 12.0*  --   --   --   --   --   --   --   HGB 9.1*   < > 6.7* 7.7* 8.1* 7.9* 7.9* 8.2*  HCT 28.7*   < > 21.4* 24.4* 25.7* 24.8* 25.2* 26.0*  MCV 92.0   < > 93.0  --  92.1 92.2 92.6 92.2  PLT 296   < > 219  --  242 258 274 322   < > = values in this interval not displayed.   Basic Metabolic Panel: Recent Labs  Lab 12/24/21 1005 12/25/21 0404 12/26/21 0346 12/27/21 0409 12/28/21 0423  NA 137 135 135 136 138  K 3.9 4.2 3.9 3.9 3.7  CL 105 104 104 106 111  CO2 19* 17* 18* 18* 18*  GLUCOSE 165* 149* 145* 115* 142*  BUN 79* 93* 98* 88* 79*  CREATININE 6.66* 6.74* 6.29* 6.04* 4.76*  CALCIUM 8.2* 8.2* 7.9* 8.2* 8.5*  PHOS  --  6.1* 5.9* 6.5* 5.2*     Recent Results (from the past 240 hour(s))  Resp Panel by RT-PCR (Flu A&B, Covid) Anterior Nasal Swab     Status: None   Collection Time: 12/22/21  7:30 AM   Specimen: Anterior Nasal Swab  Result Value Ref Range Status   SARS Coronavirus 2 by RT PCR NEGATIVE NEGATIVE Final    Comment: (NOTE) SARS-CoV-2 target nucleic acids are NOT DETECTED.  The SARS-CoV-2 RNA is generally detectable in upper respiratory specimens during the acute phase of infection. The lowest concentration of SARS-CoV-2 viral copies this assay can detect is 138 copies/mL. A negative result does not preclude SARS-Cov-2 infection and should not be used as the sole basis for treatment or other patient management decisions. A negative result may occur with  improper specimen collection/handling, submission of specimen other than nasopharyngeal swab, presence of viral mutation(s) within the areas targeted by this assay, and inadequate number of viral copies(<138 copies/mL). A  negative result must be combined with clinical observations, patient history, and epidemiological information. The expected result is Negative.  Fact Sheet for Patients:  EntrepreneurPulse.com.au  Fact Sheet for Healthcare Providers:  IncredibleEmployment.be  This test is no t yet approved or cleared by the [place] FDA and  has been authorized for detection and/or diagnosis of SARS-CoV-2 by FDA under an Emergency Use Authorization (EUA). This EUA will remain  in effect (meaning this test can be used) for the duration of the COVID-19 declaration under Section 564(b)(1) of the Act, 21 U.S.C.section 360bbb-3(b)(1), unless the authorization is terminated  or revoked sooner.       Influenza A by PCR NEGATIVE NEGATIVE Final   Influenza B by PCR NEGATIVE NEGATIVE Final    Comment: (NOTE) The Xpert Xpress SARS-CoV-2/FLU/RSV plus assay is intended as an aid in the diagnosis of influenza from Nasopharyngeal swab specimens and should not be used as a sole basis for treatment. Nasal washings and aspirates are unacceptable for Xpert Xpress SARS-CoV-2/FLU/RSV testing.  Fact Sheet for Patients: EntrepreneurPulse.com.au  Fact Sheet for Healthcare Providers: IncredibleEmployment.be  This test is not yet approved or cleared by the [place] FDA and has been authorized for detection and/or diagnosis of SARS-CoV-2 by FDA under an Emergency Use Authorization (EUA). This EUA will remain in effect (meaning this test can be used) for the duration of the COVID-19 declaration under Section 564(b)(1) of the Act, 21 U.S.C. section 360bbb-3(b)(1), unless the authorization is terminated or revoked.  Performed at [facility], [place] [address], [place], [place] [address]   Urine Culture     Status: Abnormal   Collection Time: 12/22/21  7:30 AM   Specimen: Urine, Clean Catch  Result Value Ref Range  Status   Specimen Description   Final    URINE, CLEAN CATCH Performed at [facility], [place] [address], [place], [place] [address]    Special Requests   Final    NONE Performed at [facility], [place] [address] [place], [place] [address]    Culture (A)  Final    >=100,000 COLONIES/mL KLEBSIELLA PNEUMONIAE Confirmed Extended Spectrum Beta-Lactamase Producer (ESBL).  In bloodstream infections from ESBL organisms, carbapenems are preferred over piperacillin/tazobactam. They are shown to have a lower risk of mortality.    Report Status 12/26/2021 FINAL  Final   Organism ID, Bacteria KLEBSIELLA PNEUMONIAE (A)  Final      Susceptibility   Klebsiella pneumoniae - MIC*    AMPICILLIN >=32  RESISTANT Resistant     CEFAZOLIN >=64 RESISTANT Resistant     CEFEPIME >=32 RESISTANT Resistant     CEFTRIAXONE >=64 RESISTANT Resistant     CIPROFLOXACIN >=4 RESISTANT Resistant     GENTAMICIN <=1 SENSITIVE Sensitive     IMIPENEM <=0.25 SENSITIVE Sensitive     NITROFURANTOIN 64 INTERMEDIATE Intermediate     TRIMETH/SULFA >=320 RESISTANT Resistant     AMPICILLIN/SULBACTAM >=32 RESISTANT Resistant     PIP/TAZO 16 SENSITIVE Sensitive     * >=100,000 COLONIES/mL KLEBSIELLA PNEUMONIAE     Radiology Studies: No results found.  Scheduled Meds:  amLODipine  10 mg Oral Daily   Chlorhexidine Gluconate Cloth  6 each Topical Daily   clopidogrel  75 mg Oral Daily   diclofenac Sodium  4 g Topical QID   escitalopram  20 mg Oral Daily   finasteride  5 mg Oral Daily   gabapentin  300 mg Oral QHS   guaiFENesin  600 mg Oral BID   heparin  5,000 Units Subcutaneous Q8H   hydrALAZINE  50 mg Oral TID   insulin aspart  0-5 Units Subcutaneous QHS   insulin aspart  0-9 Units Subcutaneous TID WC   ipratropium-albuterol  3 mL Nebulization BID   nortriptyline  25 mg Oral Daily   pantoprazole  40 mg Oral Daily   rosuvastatin  10 mg Oral Daily   sodium bicarbonate  650 mg Oral Daily    tamsulosin  0.4 mg Oral Daily   Continuous Infusions:  sodium chloride 10 mL/hr at 12/24/21 1231     LOS: 6 days   Shelly Coss, MD [facility] P12/14/2023, 2:16 PM

## 2021-12-28 NOTE — Plan of Care (Signed)
  Problem: Activity: Goal: Ability to tolerate increased activity will improve Outcome: Progressing   Problem: Education: Goal: Knowledge of General Education information will improve Description: Including pain rating scale, medication(s)/side effects and non-pharmacologic comfort measures Outcome: Progressing   Problem: Clinical Measurements: Goal: Will remain free from infection Outcome: Progressing Goal: Diagnostic test results will improve Outcome: Progressing   Problem: Activity: Goal: Risk for activity intolerance will decrease Outcome: Progressing

## 2021-12-28 NOTE — Progress Notes (Signed)
[name]  Subjective: no c/o's. 3.4 L UOP yesterday. Creat down to 4.7 today.   Vitals:   12/28/21 0546 12/28/21 0602 12/28/21 0653 12/28/21 0912  BP:  (!) 177/88 (!) 163/77   Pulse:  81 84   Resp:  18    Temp:  97.9 F (36.6 C)    TempSrc:  Oral    SpO2:  98%  98%  Weight: 108.1 kg     Height:        Exam: Gen: looks tired but not in distress Neck: no JVD CV:  RRR, no rub Abd:  soft, nontender Lungs: basilar rhonchi, normal WOB GU: no foley Extr: no edema Neuro: nonfocal, no asterixis   Assessment/ Plan: AKI on CKD 4 - b/l creatinine 4.0, eGFR ~20.  Creat here was 4.9 on admit, then worsened up to 5.4 --> then 6.6 on 12/10. UA looked infected and UCx grew klebsiella. Renal US 10/2021 was ok and was not repeated. Pt appeared dry and BPs were soft, we suspected hypovolemia/ prerenal w/ possible progression to ATN. Diuretics were held, pt was transfused, IVF"s were given and foley placed for urine retention (per the patient). Creat peaked at 6.7 then improved to 6.2 yest and 4.8 today. Still having nausea w/ difficulty taking solid food, but the more the renal function improves, the less likely this is due to uremia. Wts are up, will stop IVF"s for now. He is taking liquids well. Will follow.  CAP complicated by hypoxic respiratory failure- on admission CXR, CAP tx per primary; afebrile now and leukocytosis resolved. F/u CXR showed no residual infiltrates.  Klebsiella UTI - on admit, per pmd BPH/ BOO - pt had TURP in October 2023. Was not voiding well here and so a coude catheter was placed. Urology said his bladder function was poor prior to TURP and that it might now get any better post TURP.  Anemia - baseline 9, then 6.7 , then SP pRBC's. Hb 8s now. Transfuse prn.  HTN - bp's are good, 140/80 range.  Metabolic acidosis - mild, is on oral bicarb H/o combined s/d CHF DM: per primary - lowered gabapentin to 300 qhs from BID   Ryan  [name] 12/28/2021, 2:00 PM   Recent Labs  Lab 12/27/21 0409 12/28/21 0423  HGB 7.9* 8.2*  ALBUMIN 2.6* 2.7*  CALCIUM 8.2* 8.5*  PHOS 6.5* 5.2*  CREATININE 6.04* 4.76*  K 3.9 3.7    No results for input(s): "IRON", "TIBC", "FERRITIN" in the last 168 hours. Inpatient medications:  amLODipine  10 mg Oral Daily   Chlorhexidine Gluconate Cloth  6 each Topical Daily   clopidogrel  75 mg Oral Daily   diclofenac Sodium  4 g Topical QID   escitalopram  20 mg Oral Daily   finasteride  5 mg Oral Daily   gabapentin  300 mg Oral QHS   guaiFENesin  600 mg Oral BID   heparin  5,000 Units Subcutaneous Q8H   hydrALAZINE  50 mg Oral TID   insulin aspart  0-5 Units Subcutaneous QHS   insulin aspart  0-9 Units Subcutaneous TID WC   ipratropium-albuterol  3 mL Nebulization BID   nortriptyline  25 mg Oral Daily   pantoprazole  40 mg Oral Daily   rosuvastatin  10 mg Oral Daily   sodium bicarbonate  650 mg Oral Daily   tamsulosin  0.4 mg Oral Daily    sodium chloride 10 mL/hr at 12/24/21 1231   sodium chloride 75  mL/hr at 12/27/21 2157   sodium chloride, acetaminophen **OR** acetaminophen, methocarbamol, mouth rinse, oxyCODONE-acetaminophen, prochlorperazine

## 2021-12-28 NOTE — Evaluation (Signed)
Physical Therapy Evaluation Patient Details Name: Ryan Jenkins MRN: [id number] DOB: 08-09-1960 Today's Date: 12/28/2021  History of Present Illness  Pt is a 61 y/o M admitted on 12/22/21. Pt was in the hospital last month for decompensated CHF. Pt presents this time with c/o cough & SOB & x-ray showed multifocal PNA & found to have worsening renal function. Pt also with urinary retention & foley was placed. PMH: CKD stage IV, diastolic heart failure  Clinical Impression  Pt seen for PT evaluation with pt agreeable. Pt reports prior to admission he was independent with transfers & ambulatory with rollator. On this date, pt requires mod assist with hospital bed features to come to sitting EOB. Pt requires mod assist for STS from elevated surfaces, unable to transfer STS from low surfaces even with max assist. Pt is able to progress to taking a few steps in room with CGA but does require close chair follow for safety & 2/2 pt's need for seated rest breaks after a few steps. Pt is unsafe to d/c home alone & due to multiple hospitalizations, would benefit from STR upon d/c to maximize independence with functional mobility & reduce fall risk prior to return home.   Recommendations for follow up therapy are one component of a multi-disciplinary discharge planning process, led by the attending physician.  Recommendations may be updated based on patient status, additional functional criteria and insurance authorization.  Follow Up Recommendations Skilled nursing-short term rehab (<3 hours/day) Can patient physically be transported by private vehicle: No    Assistance Recommended at Discharge Frequent or constant Supervision/Assistance  Patient can return home with the following  A lot of help with walking and/or transfers;A lot of help with bathing/dressing/bathroom;Assist for transportation;Assistance with cooking/housework    Equipment Recommendations  (TBD in next venue)  Recommendations for  Other Services  OT consult    Functional Status Assessment Patient has had a recent decline in their functional status and demonstrates the ability to make significant improvements in function in a reasonable and predictable amount of time.     Precautions / Restrictions Precautions Precautions: Fall Restrictions Weight Bearing Restrictions: No      Mobility  Bed Mobility Overal bed mobility: Needs Assistance Bed Mobility: Supine to Sit     Supine to sit: Mod assist, HOB elevated     General bed mobility comments: extra time & cuing to fully upright trunk, scoot to sitting EOB    Transfers Overall transfer level: Needs assistance Equipment used: Rolling walker (2 wheels) Transfers: Sit to/from Stand, Bed to chair/wheelchair/BSC Sit to Stand: Mod assist (cuing to scoot out to edge of seat, push with 1 or both UE, extra time to power up to standing & transition BUE hands to RW)   Step pivot transfers: Min assist (bed>recliner via step pivot with RW & extra time)            Ambulation/Gait Ambulation/Gait assistance: Min guard (chair follow for safety) Gait Distance (Feet): 4 Feet Assistive device: Rolling walker (2 wheels) Gait Pattern/deviations: Decreased step length - right, Decreased step length - left, Decreased stride length, Decreased dorsiflexion - right, Decreased dorsiflexion - left Gait velocity: decreased     General Gait Details: limited by fatigue  Stairs            Wheelchair Mobility    Modified Rankin (Stroke Patients Only)       Balance Overall balance assessment: Needs assistance Sitting-balance support: Feet supported Sitting balance-Leahy Scale: Fair Sitting balance - Comments:  supervision static sitting   Standing balance support: Bilateral upper extremity supported, Reliant on assistive device for balance, During functional activity Standing balance-Leahy Scale: Poor                               Pertinent  Vitals/Pain Pain Assessment Pain Assessment: Faces Faces Pain Scale: Hurts whole lot Pain Location: sharp pain radiating to head, neck & shoulders Pain Descriptors / Indicators: Sharp Pain Intervention(s): Monitored during session (pt reports heat helps, asked MD & nurse for water heating pad for pt)    Home Living Family/patient expects to be discharged to:: Private residence Living Arrangements:  (brother) Available Help at Discharge: Family;Personal care attendant;Available PRN/intermittently (sitter MWF 10-2 (per previous entry), pt's brother works during the day) Type of Home: Apartment Home Access: Level entry       Home Layout: One level Home Equipment: Conservation officer, nature (2 wheels);Rollator (4 wheels);BSC/3in1;Tub bench;Wheelchair - manual Additional Comments: Mod indep with household mobility    Prior Function Prior Level of Function : Independent/Modified Independent             Mobility Comments: Pt reports he was using rollator & independent with transfers prior to this admission.       Hand Dominance        Extremity/Trunk Assessment   Upper Extremity Assessment Upper Extremity Assessment: Generalized weakness    Lower Extremity Assessment Lower Extremity Assessment: Generalized weakness (3-/5 knee extension BLE)       Communication   Communication: No difficulties  Cognition Arousal/Alertness: Awake/alert Behavior During Therapy: WFL for tasks assessed/performed Overall Cognitive Status: No family/caregiver present to determine baseline cognitive functioning                                 General Comments: Pt requires extra time/questioning cuing to recall current location. Pt follows simple commands well throughout session.        General Comments      Exercises     Assessment/Plan    PT Assessment Patient needs continued PT services  PT Problem List Decreased strength;Pain;Decreased activity tolerance;Decreased  balance;Decreased mobility;Decreased knowledge of use of DME;Decreased safety awareness       PT Treatment Interventions DME instruction;Therapeutic exercise;Balance training;Gait training;Stair training;Neuromuscular re-education;Functional mobility training;Therapeutic activities;Patient/family education;Modalities    PT Goals (Current goals can be found in the Care Plan section)  Acute Rehab PT Goals Patient Stated Goal: decreased pain PT Goal Formulation: With patient Time For Goal Achievement: 01/11/22 Potential to Achieve Goals: Good    Frequency Min 3X/week     Co-evaluation               AM-PAC PT "6 Clicks" Mobility  Outcome Measure Help needed turning from your back to your side while in a flat bed without using bedrails?: A Lot Help needed moving from lying on your back to sitting on the side of a flat bed without using bedrails?: A Lot Help needed moving to and from a bed to a chair (including a wheelchair)?: A Little Help needed standing up from a chair using your arms (e.g., wheelchair or bedside chair)?: A Lot Help needed to walk in hospital room?: A Lot Help needed climbing 3-5 steps with a railing? : Total 6 Click Score: 12    End of Session   Activity Tolerance: Patient limited by fatigue;Patient limited by pain Patient left: in  chair;with call bell/phone within reach Nurse Communication: Mobility status (c/o pain) PT Visit Diagnosis: Muscle weakness (generalized) (M62.81);Difficulty in walking, not elsewhere classified (R26.2)    Time: 1658-0063 PT Time Calculation (min) (ACUTE ONLY): 19 min   Charges:   PT Evaluation $PT Eval Moderate Complexity: [place], PT, DPT 12/28/21, 11:03 AM   Waunita Schooner 12/28/2021, 11:01 AM

## 2021-12-29 LAB — BASIC METABOLIC PANEL
Anion gap: 12 (ref 5–15)
BUN: 61 mg/dL — ABNORMAL HIGH (ref 8–23)
CO2: 19 mmol/L — ABNORMAL LOW (ref 22–32)
Calcium: 8.6 mg/dL — ABNORMAL LOW (ref 8.9–10.3)
Chloride: 111 mmol/L (ref 98–111)
Creatinine, Ser: 4.05 mg/dL — ABNORMAL HIGH (ref 0.61–1.24)
GFR, Estimated: 16 mL/min — ABNORMAL LOW (ref >60)
Glucose, Bld: 99 mg/dL (ref 70–99)
Potassium: 3.7 mmol/L (ref 3.5–5.1)
Sodium: 142 mmol/L (ref 135–145)

## 2021-12-29 LAB — GLUCOSE, CAPILLARY
Glucose-Capillary: 98 mg/dL (ref 70–99)
Glucose-Capillary: 113 mg/dL — ABNORMAL HIGH (ref 70–99)
Glucose-Capillary: 145 mg/dL — ABNORMAL HIGH (ref 70–99)
Glucose-Capillary: 161 mg/dL — ABNORMAL HIGH (ref 70–99)

## 2021-12-29 MED ORDER — METOPROLOL SUCCINATE ER 50 MG PO TB24
50.0000 mg | ORAL_TABLET | Freq: Every day | ORAL | Status: DC
  Administered 2021-12-29 – 2021-12-30 (×2): 50 mg via ORAL
  Filled 2021-12-29 (×2): qty 1

## 2021-12-29 MED ORDER — HYDRALAZINE HCL 50 MG PO TABS
100.0000 mg | ORAL_TABLET | Freq: Three times a day (TID) | ORAL | Status: DC
  Administered 2021-12-29 – 2021-12-30 (×3): 100 mg via ORAL
  Filled 2021-12-29 (×3): qty 2

## 2021-12-29 NOTE — Evaluation (Signed)
Occupational Therapy Evaluation Patient Details Name: Ryan Jenkins MRN: [id number] DOB: 1960-10-15 Today's Date: 12/29/2021   History of Present Illness Pt is a 61 y/o M admitted on 12/22/21. Pt was in the hospital last month for decompensated CHF. Pt presents this time with c/o cough & SOB & x-ray showed multifocal PNA & found to have worsening renal function. Pt also with urinary retention & foley was placed. PMH: CKD stage IV, diastolic heart failure   Clinical Impression   Patient is currently requiring assistance with ADLs including up to minimal assist with Lower body ADLs, setup to minimal assist with Upper body ADLs,  as well as increased effort and time with bed mobility and moderate assist with functional transfers to toilet.  Current level of function is below patient's typical baseline.  During this evaluation, patient was limited by generalized weakness, impaired activity tolerance, and pain to neck/shoulders and bilateral feet as well as increased LE edema, all of which has the potential to impact patient's safety and independence during functional mobility, as well as performance for ADLs.  Patient lives at home, with his brother and has a Victoria Aide 3x/wk. Pt's sister is also available for further help.   Patient demonstrates good rehab potential, and should benefit from continued skilled occupational therapy services while in acute care to maximize safety, independence and quality of life at home.  Continued occupational therapy services in the home is recommended.  ?       Recommendations for follow up therapy are one component of a multi-disciplinary discharge planning process, led by the attending physician.  Recommendations may be updated based on patient status, additional functional criteria and insurance authorization.   Follow Up Recommendations  Home health OT     Assistance Recommended at Discharge Intermittent Supervision/Assistance  Patient can return home with  the following A little help with bathing/dressing/bathroom;A little help with walking and/or transfers;Assistance with cooking/housework    Functional Status Assessment  Patient has had a recent decline in their functional status and demonstrates the ability to make significant improvements in function in a reasonable and predictable amount of time.  Equipment Recommendations  Other (comment) (Long handled adaptive equipment: Reacher, sock aid (wide), long bath sponge and long shoe horn)    Recommendations for Other Services       Precautions / Restrictions Precautions Precautions: Fall Restrictions Weight Bearing Restrictions: No      Mobility Bed Mobility Overal bed mobility: Needs Assistance Bed Mobility: Supine to Sit, Sit to Supine     Supine to sit: Supervision, HOB elevated Sit to supine: Supervision   General bed mobility comments: Increased time and effort with supine<>Sit. Bed flat with sit to supine and pt showing fair trunk control.    Transfers                          Balance Overall balance assessment: Needs assistance Sitting-balance support: Feet supported Sitting balance-Leahy Scale: Good Sitting balance - Comments: Able to use BUEs for ADLs tasks at EOB without LOB     Standing balance-Leahy Scale: Poor                             ADL either performed or assessed with clinical judgement   ADL Overall ADL's : Needs assistance/impaired Eating/Feeding: Sitting;Set up   Grooming: Sitting;Set up;Oral care;Wash/dry face   Upper Body Bathing: Set up;Sitting   Lower Body Bathing:  Min guard;Sitting/lateral leans;Sit to/from stand   Upper Body Dressing : Minimal assistance;Sitting Upper Body Dressing Details (indicate cue type and reason): Min As for buttons/fasteners due to Bilateral hand weakness. Lower Body Dressing: Sitting/lateral leans;Sit to/from stand;Minimal assistance;Set up Lower Body Dressing Details (indicate cue  type and reason): Able to doff and don each sock with RPE of 5 reported after and mild SHOB noted. Pt educated on adaptive equipment and compensatory positions to minimize forward bend to floor.  Min As for standing dressing. Toilet Transfer: Moderate assistance;BSC/3in1;Rolling walker (2 wheels);Ambulation Toilet Transfer Details (indicate cue type and reason): Pt stood from slightly elevated EOB to RW with Moderate assist, cues for anterior weight shift. Increased time and effort to come upright. Toileting- Clothing Manipulation and Hygiene: Minimal assistance Toileting - Clothing Manipulation Details (indicate cue type and reason): Foley cath.  Anticipate up to Min As for posterior peri hygiene and clothing management. Tub/ Shower Transfer: Tub bench   Functional mobility during ADLs: Min guard;Rolling walker (2 wheels);Moderate assistance       Vision Baseline Vision/History: 1 Wears glasses (readers) Additional Comments: Has eye appointment this month. h/o retina surgery and laser surgery per pt. It's time for his LT eye treatment.     Perception     Praxis      Pertinent Vitals/Pain Pain Assessment Pain Assessment: 0-10 Pain Score: 5  Pain Location: head, neck & shoulders as well as feet. Neck pain is acute. Foot pain is neuropathy related. Pain Intervention(s): Monitored during session, Limited activity within patient's tolerance     Hand Dominance Right   Extremity/Trunk Assessment Upper Extremity Assessment Upper Extremity Assessment: Generalized weakness;RUE deficits/detail;LUE deficits/detail RUE Coordination: decreased fine motor LUE Coordination: decreased fine motor   Lower Extremity Assessment Lower Extremity Assessment:  (1+ edema to Bilateral LEs)   Cervical / Trunk Assessment Cervical / Trunk Assessment: Kyphotic   Communication Communication Communication: No difficulties   Cognition Arousal/Alertness: Awake/alert Behavior During Therapy: WFL for tasks  assessed/performed Overall Cognitive Status: No family/caregiver present to determine baseline cognitive functioning                                       General Comments       Exercises     Shoulder Instructions      Home Living Family/patient expects to be discharged to:: Private residence   Available Help at Discharge: Family;Personal care attendant;Available PRN/intermittently Type of Home: Apartment Home Access: Level entry     Home Layout: One level     Bathroom Shower/Tub: Teacher, early years/pre: Standard Bathroom Accessibility: Yes How Accessible: Accessible via wheelchair Home Equipment: Rolling Walker (2 wheels);Rollator (4 wheels);BSC/3in1;Tub bench;Wheelchair - manual   Additional Comments: Mod indep with household mobility. Uses WC inside the apartment and rollator in the community.      Prior Functioning/Environment Prior Level of Function : Independent/Modified Independent             Mobility Comments: Pt reports he was using rollator & independent with transfers prior to this admission. Pt reports that he was setup with [facility] PT but the PTA was on a cruise, so he has only had Nursing and a PT Evaluation. Waiting for PT treatment to begin. ADLs Comments: Kennon Holter that comes MWF does household chores, but pt mod I with ADLs        OT Problem List: Decreased strength;Decreased coordination;Pain;Cardiopulmonary status limiting  activity;Increased edema;Decreased activity tolerance;Impaired balance (sitting and/or standing);Decreased knowledge of use of DME or AE      OT Treatment/Interventions: Self-care/ADL training;Therapeutic exercise;Therapeutic activities;Energy conservation;Patient/family education;Balance training;DME and/or AE instruction    OT Goals(Current goals can be found in the care plan section) Acute Rehab OT Goals Patient Stated Goal: To go home and not to inpatient rehab. OT Goal Formulation: With patient Time  For Goal Achievement: 01/12/22 Potential to Achieve Goals: Good ADL Goals Pt Will Perform Grooming: with modified independence;standing Pt Will Perform Lower Body Dressing: with modified independence;sit to/from stand;sitting/lateral leans (with RPE no more than 3/10 using energy conservation strateiges) Pt Will Transfer to Toilet: with modified independence;ambulating;bedside commode Pt Will Perform Toileting - Clothing Manipulation and hygiene: with modified independence;with adaptive equipment;sitting/lateral leans;sit to/from stand (AE training if needed) Additional ADL Goal #1: Patient will identify at least 3 energy conservation strategies to employ at home in order to maximize function and quality of life and decrease caregiver burden while preventing exacerbation of symptoms and rehospitalization.  OT Frequency: Min 2X/week    Co-evaluation              AM-PAC OT "6 Clicks" Daily Activity     Outcome Measure Help from another person eating meals?: A Little Help from another person taking care of personal grooming?: A Little Help from another person toileting, which includes using toliet, bedpan, or urinal?: A Little Help from another person bathing (including washing, rinsing, drying)?: A Little Help from another person to put on and taking off regular upper body clothing?: A Little Help from another person to put on and taking off regular lower body clothing?: A Little 6 Click Score: 18   End of Session Equipment Utilized During Treatment: Gait belt;Rolling walker (2 wheels) Nurse Communication: Other (comment) (Increaed BLE edema and pt concerns)  Activity Tolerance: Patient tolerated treatment well Patient left: in bed;with call bell/phone within reach;with bed alarm set  OT Visit Diagnosis: Unsteadiness on feet (R26.81);Pain;Muscle weakness (generalized) (M62.81) Pain - part of body:  (neck, shoulders, feet)                Time: 4098-1191 OT Time Calculation (min): 48  min Charges:  OT General Charges $OT Visit: 1 Visit OT Evaluation $OT Eval Low Complexity: 1 Low OT Treatments $Self Care/Home Management : 8-22 mins $Therapeutic Activity: 8-22 mins  Anderson Malta, OT Acute Rehab Services Office: [phone] 12/29/2021  Julien Girt 12/29/2021, 10:38 AM

## 2021-12-29 NOTE — TOC Initial Note (Signed)
Transition of Care [facility]) - Initial/Assessment Note    Patient Details  Name: Ryan Jenkins MRN: [id number] Date of Birth: Mar 05, 1960  [facility]) CM/SW Contact:    Illene Regulus, LCSW Phone Number: 12/29/2021, 2:16 PM  Clinical Narrative:                 TOC CSW attempted to call pt to discuss SNF placement recommendation , no response.TOC to follow .     Expected Discharge Plan: Home/Self Care Barriers to Discharge: Continued Medical Work up   Patient Goals and CMS Choice Patient states their goals for this hospitalization and ongoing recovery are:: to go home      Expected Discharge Plan and Services Expected Discharge Plan: Home/Self Care   Discharge Planning Services: CM Consult   Living arrangements for the past 2 months: Apartment                                      Prior Living Arrangements/Services Living arrangements for the past 2 months: Apartment Lives with:: Self Patient language and need for interpreter reviewed:: Yes Do you feel safe going back to the place where you live?: Yes            Criminal Activity/Legal Involvement Pertinent to Current Situation/Hospitalization: No - Comment as needed  Activities of Daily Living Home Assistive Devices/Equipment: None ADL Screening (condition at time of admission) Patient's cognitive ability adequate to safely complete daily activities?: Yes Is the patient deaf or have difficulty hearing?: No Does the patient have difficulty seeing, even when wearing glasses/contacts?: No Does the patient have difficulty concentrating, remembering, or making decisions?: No Patient able to express need for assistance with ADLs?: Yes Does the patient have difficulty dressing or bathing?: Yes Independently performs ADLs?: Yes (appropriate for developmental age) Does the patient have difficulty walking or climbing stairs?: Yes Weakness of Legs: Both Weakness of Arms/Hands: Both  Permission  Sought/Granted                  Emotional Assessment Appearance:: Appears stated age Attitude/Demeanor/Rapport: Engaged Affect (typically observed): Calm Orientation: : Oriented to Self, Oriented to Place, Oriented to  Time, Oriented to Situation Alcohol / Substance Use: Not Applicable Psych Involvement: No (comment)  Admission diagnosis:  Multifocal pneumonia [J18.9] Dyspnea, unspecified type [R06.00] Patient Active Problem List   Diagnosis Date Noted   Multifocal pneumonia 12/22/2021   CHF exacerbation ([place]) 10/19/2021   Hypokalemia 10/18/2021   Anemia in chronic kidney disease (CKD) 10/18/2021   BPH (benign prostatic hyperplasia) 10/16/2021   Severe sepsis ([place]) 06/05/2021   Coronary artery disease 02/07/2021   Acute renal failure superimposed on stage 4 chronic kidney disease ([place])    CKD (chronic kidney disease) stage 4, GFR 15-29 ml/min ([place]) 07/07/2020   Acute on chronic diastolic CHF (congestive heart failure) ([place]) 07/07/2020   Erectile dysfunction associated with type 2 diabetes mellitus ([place]) 08/07/2019   Vitreous hemorrhage of left eye ([place]) 07/22/2019   Vision loss of left eye 07/22/2019   History of medication noncompliance 04/02/2019   Gastric ulcer without hemorrhage or perforation 12/25/2018   Gastroesophageal reflux disease without esophagitis 09/04/2018   Diabetic retinopathy of both eyes associated with type 2 diabetes mellitus ([place]) 01/03/2018   Intermittent diarrhea 09/27/2017   Gastroparesis 09/27/2017   Macroalbuminuric diabetic nephropathy ([place]) 06/25/2017   History of falling 06/25/2017   Hyperlipidemia 03/21/2017  Vitamin B 12 deficiency 03/21/2017   Type 2 diabetes mellitus with peripheral neuropathy ([place]) 02/05/2017   Essential hypertension 02/05/2017   Depression 02/05/2017   Unintended weight loss 02/05/2017   Gait disturbance 02/05/2017   Pronation deformity of both feet 05/11/2014   Diabetic neuropathy, type II diabetes mellitus ([place])  05/11/2014   Metatarsal deformity 05/11/2014   PCP:  [name], MD Pharmacy:   [facility] [place], [address] AT [address] [place] [address] Phone: [phone] Fax: [phone]  [id number] [place] [id number] [id number]: [phone]: [phone]    Social Determinants of Health (SDOH) Interventions Food Insecurity Interventions: Intervention Not Indicated Housing Interventions: Intervention Not Indicated Transportation Interventions: Intervention Not Indicated Utilities Interventions: Intervention Not Indicated  Readmission Risk Interventions    11/29/2021    4:16 PM  Readmission Risk Prevention Plan  Transportation Screening Complete  Medication Review (Press photographer Complete  HRI or Home Care Consult Complete  SW Recovery Care/Counseling Consult Complete  Palliative Care Screening Not [place] Applicable

## 2021-12-29 NOTE — Progress Notes (Signed)
PROGRESS NOTE  Ryan Jenkins  DGL:[id number] DOB: 1960/08/12 DOA: 12/22/2021 PCP: [name], MD   Brief Narrative: 61 year old male with a history of chronic kidney disease stage IV, diastolic heart failure, recently in the hospital for decompensated CHF and was released last month after significant diuresis, comes to the hospital with cough and shortness of breath.  X-ray shows multifocal pneumonia.   Started on IV antibiotics.  He was also noted to have worsening renal function.  Felt to be hypovolemic.  Started on IV fluids.  Nephrology following for renal issues.  He was noted to have urinary retention.  Had Foley catheter placed with coud catheter.  Discussed with his urologist who recommended continuing catheter on discharge until he can follow-up.  Creatinine has improved with iv fluids,now stopped.   Assessment & Plan:  Principal Problem:   Multifocal pneumonia Active Problems:   Acute on chronic diastolic CHF (congestive heart failure) (HCC)   Type 2 diabetes mellitus with peripheral neuropathy (HCC)   Essential hypertension   Hyperlipidemia   Gastroparesis   Gastroesophageal reflux disease without esophagitis   Acute renal failure superimposed on stage 4 chronic kidney disease (HCC)   Coronary artery disease   BPH (benign prostatic hyperplasia)   Anemia in chronic kidney disease (CKD)   Multifocal pneumonia -On ceftriaxone and azithromycin,completed course -Continue pulmonary hygiene -Encourage flutter valve -strep pneumo antigen negative. Legionella antigen negative -oxygen requirements up to 11L, now has been weaned down to RA  AKI on CKD stage IV -Baseline creatinine approximately 4 -Creatinine on admission noted to be 4.9 , now improved back to the range of 4 -IV fluids stopped -Nephrology following, appreciate input -Renal ultrasound negative for obstructive process   Chronic diastolic congestive heart failure -Has significant bilateral lower extremity  edema -Ordered compression stockings bilaterally    Normal anion gap metabolic acidosis -Likely related to renal failure -Started oral bicarb   Anemia chronic kidney disease -Current hemoglobin in the range of 7-8 -Baseline hemoglobin appears to run between 8-9 -no reported bleeding -transfused with 1 unit of prbc with follow-up hemoglobin improved to the range of 8 during this hospitalization   Gastroparesis -Continue on Compazine   Insulin-dependent diabetes -Chronically on Lantus, currently on hold -Continue on sliding scale insulin -Overall blood sugars appear to be stable   Hyperlipidemia -Continue on statin   Hypertension -Chronically on amlodipine, hydralazine, metoprolol -Blood pressure on higher range, dose of medications increased  ESBL Klebsiella UTI -Present on admission -Urine culture shows Klebsiella ESBL, similar to culture from 11/2021.  He does not have any dysuria at this time.  Suspect his bladder is colonized with this bacteria. No indication for treatment unless he becomes symptomatic or has fevers.   BPH with bladder outlet obstruction -Status post TURP 10/2021 -Noted to have postvoid residuals around 400 cc -Catheter placement with coud catheter performed -Discussed with his primary urologist, [name] who reported that patient has an areflexic bladder and he had mentioned to the patient that TURP may not help his urinary retention issues -He has recommended to continue catheter on discharge until he can follow-up with urology as an outpatient -Continue on Flomax and finasteride  Generalized weakness PT/OT recommending home health on discharge        DVT prophylaxis:heparin injection 5,000 Units Start: 12/22/21 2200     Code Status: Full Code  Family Communication: Brother at bedside on 12/13  Patient status:Inpatient  Patient is from :Home  Anticipated discharge JJ:OACZ  Estimated DC date:after  further improvement in renal  function,BP   Consultants: Nephrology,urology  Procedures:None  Antimicrobials:  Anti-infectives (From admission, onward)    Start     Dose/Rate Route Frequency Ordered Stop   12/25/21 1000  azithromycin (ZITHROMAX) tablet 500 mg  Status:  Discontinued        500 mg Oral Daily 12/25/21 0721 12/27/21 1622   12/22/21 1145  azithromycin (ZITHROMAX) 500 mg in sodium chloride 0.9 % 250 mL IVPB  Status:  Discontinued        500 mg 250 mL/hr over 60 Minutes Intravenous Every 24 hours 12/22/21 1131 12/25/21 0720   12/22/21 1145  cefTRIAXone (ROCEPHIN) 1 g in sodium chloride 0.9 % 100 mL IVPB  Status:  Discontinued        1 g 200 mL/hr over 30 Minutes Intravenous Every 24 hours 12/22/21 1131 12/27/21 1622       Subjective: Patient seen and examined at bedside today.  Hemodynamically stable.  Denies shortness of breath or cough.  His main concern today is his bilateral ankle swelling.  Objective: Vitals:   12/28/21 1514 12/28/21 2114 12/29/21 0421 12/29/21 1153  BP: (!) 175/88 (!) 158/71 (!) 172/82 (!) 174/86  Pulse: 81 87 90 93  Resp: '18 18 16 18  '$ Temp: 98 F (36.7 C) 97.6 F (36.4 C) 97.7 F (36.5 C) 98.4 F (36.9 C)  TempSrc:  Oral Oral Oral  SpO2: 100% 100% 100% 100%  Weight:      Height:        Intake/Output Summary (Last 24 hours) at 12/29/2021 1322 Last data filed at 12/29/2021 0500 Gross per 24 hour  Intake 943.02 ml  Output 3035 ml  Net -2091.98 ml   Filed Weights   12/26/21 0500 12/27/21 0428 12/28/21 0546  Weight: 105.4 kg 106.8 kg 108.1 kg    Examination:  General exam: Overall comfortable, not in distress, obese, weak HEENT: PERRL Respiratory system:  no wheezes or crackles  Cardiovascular system: S1 & S2 heard, RRR.  Gastrointestinal system: Abdomen is nondistended, soft and nontender. Central nervous system: Alert and oriented Extremities: Bilateral lower extremity pitting edema, no clubbing ,no cyanosis Skin: No rashes, no ulcers,no icterus       Data Reviewed: I have personally reviewed following labs and imaging studies  CBC: Recent Labs  Lab 12/24/21 1005 12/24/21 2324 12/25/21 0404 12/26/21 0346 12/27/21 0409 12/28/21 0423  WBC 5.8  --  6.2 5.9 5.4 6.0  HGB 6.7* 7.7* 8.1* 7.9* 7.9* 8.2*  HCT 21.4* 24.4* 25.7* 24.8* 25.2* 26.0*  MCV 93.0  --  92.1 92.2 92.6 92.2  PLT 219  --  242 258 274 299   Basic Metabolic Panel: Recent Labs  Lab 12/25/21 0404 12/26/21 0346 12/27/21 0409 12/28/21 0423 12/29/21 0412  NA 135 135 136 138 142  K 4.2 3.9 3.9 3.7 3.7  CL 104 104 106 111 111  CO2 17* 18* 18* 18* 19*  GLUCOSE 149* 145* 115* 142* 99  BUN 93* 98* 88* 79* 61*  CREATININE 6.74* 6.29* 6.04* 4.76* 4.05*  CALCIUM 8.2* 7.9* 8.2* 8.5* 8.6*  PHOS 6.1* 5.9* 6.5* 5.2*  --      Recent Results (from the past 240 hour(s))  Resp Panel by RT-PCR (Flu A&B, Covid) Anterior Nasal Swab     Status: None   Collection Time: 12/22/21  7:30 AM   Specimen: Anterior Nasal Swab  Result Value Ref Range Status   SARS Coronavirus 2 by RT PCR NEGATIVE NEGATIVE Final  Comment: (NOTE) SARS-CoV-2 target nucleic acids are NOT DETECTED.  The SARS-CoV-2 RNA is generally detectable in upper respiratory specimens during the acute phase of infection. The lowest concentration of SARS-CoV-2 viral copies this assay can detect is 138 copies/mL. A negative result does not preclude SARS-Cov-2 infection and should not be used as the sole basis for treatment or other patient management decisions. A negative result may occur with  improper specimen collection/handling, submission of specimen other than nasopharyngeal swab, presence of viral mutation(s) within the areas targeted by this assay, and inadequate number of viral copies(<138 copies/mL). A negative result must be combined with clinical observations, patient history, and epidemiological information. The expected result is Negative.  Fact Sheet for Patients:   EntrepreneurPulse.com.au  Fact Sheet for Healthcare Providers:  IncredibleEmployment.be  This test is no t yet approved or cleared by the [place] FDA and  has been authorized for detection and/or diagnosis of SARS-CoV-2 by FDA under an Emergency Use Authorization (EUA). This EUA will remain  in effect (meaning this test can be used) for the duration of the COVID-19 declaration under Section 564(b)(1) of the Act, 21 U.S.C.section 360bbb-3(b)(1), unless the authorization is terminated  or revoked sooner.       Influenza A by PCR NEGATIVE NEGATIVE Final   Influenza B by PCR NEGATIVE NEGATIVE Final    Comment: (NOTE) The Xpert Xpress SARS-CoV-2/FLU/RSV plus assay is intended as an aid in the diagnosis of influenza from Nasopharyngeal swab specimens and should not be used as a sole basis for treatment. Nasal washings and aspirates are unacceptable for Xpert Xpress SARS-CoV-2/FLU/RSV testing.  Fact Sheet for Patients: EntrepreneurPulse.com.au  Fact Sheet for Healthcare Providers: IncredibleEmployment.be  This test is not yet approved or cleared by the [place] FDA and has been authorized for detection and/or diagnosis of SARS-CoV-2 by FDA under an Emergency Use Authorization (EUA). This EUA will remain in effect (meaning this test can be used) for the duration of the COVID-19 declaration under Section 564(b)(1) of the Act, 21 U.S.C. section 360bbb-3(b)(1), unless the authorization is terminated or revoked.  Performed at [facility], [place] [address], [place], [place] [address]   Urine Culture     Status: Abnormal   Collection Time: 12/22/21  7:30 AM   Specimen: Urine, Clean Catch  Result Value Ref Range Status   Specimen Description   Final    URINE, CLEAN CATCH Performed at [facility], [place] [address] [place], [place] [address]    Special Requests    Final    NONE Performed at [facility], [place] [address], [place], [place] [address]    Culture (A)  Final    >=100,000 COLONIES/mL KLEBSIELLA PNEUMONIAE Confirmed Extended Spectrum Beta-Lactamase Producer (ESBL).  In bloodstream infections from ESBL organisms, carbapenems are preferred over piperacillin/tazobactam. They are shown to have a lower risk of mortality.    Report Status 12/26/2021 FINAL  Final   Organism ID, Bacteria KLEBSIELLA PNEUMONIAE (A)  Final      Susceptibility   Klebsiella pneumoniae - MIC*    AMPICILLIN >=32 RESISTANT Resistant     CEFAZOLIN >=64 RESISTANT Resistant     CEFEPIME >=32 RESISTANT Resistant     CEFTRIAXONE >=64 RESISTANT Resistant     CIPROFLOXACIN >=4 RESISTANT Resistant     GENTAMICIN <=1 SENSITIVE Sensitive     IMIPENEM <=0.25 SENSITIVE Sensitive     NITROFURANTOIN 64 INTERMEDIATE Intermediate     TRIMETH/SULFA >=320 RESISTANT Resistant  AMPICILLIN/SULBACTAM >=32 RESISTANT Resistant     PIP/TAZO 16 SENSITIVE Sensitive     * >=100,000 COLONIES/mL KLEBSIELLA PNEUMONIAE     Radiology Studies: No results found.  Scheduled Meds:  amLODipine  10 mg Oral Daily   Chlorhexidine Gluconate Cloth  6 each Topical Daily   clopidogrel  75 mg Oral Daily   diclofenac Sodium  4 g Topical QID   escitalopram  20 mg Oral Daily   finasteride  5 mg Oral Daily   gabapentin  300 mg Oral QHS   guaiFENesin  600 mg Oral BID   heparin  5,000 Units Subcutaneous Q8H   hydrALAZINE  100 mg Oral TID   insulin aspart  0-5 Units Subcutaneous QHS   insulin aspart  0-9 Units Subcutaneous TID WC   metoprolol succinate  50 mg Oral Daily   nortriptyline  25 mg Oral Daily   pantoprazole  40 mg Oral Daily   rosuvastatin  10 mg Oral Daily   sodium bicarbonate  650 mg Oral Daily   tamsulosin  0.4 mg Oral Daily   Continuous Infusions:  sodium chloride 10 mL/hr at 12/24/21 1231     LOS: 7 days   Shelly Coss, MD [facility] P12/15/2023,  1:22 PM

## 2021-12-29 NOTE — Progress Notes (Signed)
[facility] Progress Note  Subjective: no c/o's. Creat down 4.0.   Vitals:   12/28/21 1514 12/28/21 2114 12/29/21 0421 12/29/21 1153  BP: (!) 175/88 (!) 158/71 (!) 172/82 (!) 174/86  Pulse: 81 87 90 93  Resp: _0 Temp: 98 F (36.7 C) 97.6 F (36.4 C) 97.7 F (36.5 C) 98.4 F (36.9 C)  TempSrc:  Oral Oral Oral  SpO2: 100% 100% 100% 100%  Weight:      Height:        Exam: Gen: looks tired but not in distress Neck: no JVD CV:  RRR, no rub Abd:  soft, nontender Lungs: basilar rhonchi, normal WOB GU: no foley Extr: no edema Neuro: nonfocal, no asterixis   Assessment/ Plan: AKI on CKD 4 - b/l creatinine 4.0, eGFR ~20.  Creat here was 4.9 on admit, then worsened up to 5.4 --> then 6.6 on 12/10. UA looked infected and UCx grew klebsiella. Renal US 10/2021 was ok and was not repeated. Pt appeared dry and BPs were soft, we suspected hypovolemia/ prerenal w/ possible progression to ATN. Diuretics were held, pt was transfused, IVF"s were given and foley placed for urine retention. Creat peaked at 6.7 then improved to 6.2 --> 4.8 yest --> 4.0 today. Creat back to baseline, have dc'd IVF"s. Pt has f/u visit w/ Dr Carolin Sicks at [facility] next week. No other suggestions, will sign off.  CAP complicated by hypoxic respiratory failure- on admission CXR, CAP tx w/ abx per primary; afebrile now and leukocytosis resolved. F/u CXR showed no residual infiltrates.  Klebsiella UTI - on admit, per pmd BPH/ BOO - pt had TURP in October 2023. Was not voiding well here and so a coude catheter was placed. Urology said his bladder function was poor prior to TURP and that it might now get any better post TURP.  Anemia - baseline 9, then 6.7 , then SP pRBC's. Hb 8s now HTN - bp's are good, 140/80 range.  Metabolic acidosis - mild, is on oral bicarb H/o combined s/d CHF DM: per primary - lowered gabapentin to 300 qhs from BID   Rob Vernor Monnig 12/29/2021, 4:13 PM   Recent Labs  Lab  12/27/21 0409 12/28/21 0423 12/29/21 0412  HGB 7.9* 8.2*  --   ALBUMIN 2.6* 2.7*  --   CALCIUM 8.2* 8.5* 8.6*  PHOS 6.5* 5.2*  --   CREATININE 6.04* 4.76* 4.05*  K 3.9 3.7 3.7    No results for input(s): "IRON", "TIBC", "FERRITIN" in the last 168 hours. Inpatient medications:  amLODipine  10 mg Oral Daily   Chlorhexidine Gluconate Cloth  6 each Topical Daily   clopidogrel  75 mg Oral Daily   diclofenac Sodium  4 g Topical QID   escitalopram  20 mg Oral Daily   finasteride  5 mg Oral Daily   gabapentin  300 mg Oral QHS   guaiFENesin  600 mg Oral BID   heparin  5,000 Units Subcutaneous Q8H   hydrALAZINE  100 mg Oral TID   insulin aspart  0-5 Units Subcutaneous QHS   insulin aspart  0-9 Units Subcutaneous TID WC   metoprolol succinate  50 mg Oral Daily   nortriptyline  25 mg Oral Daily   pantoprazole  40 mg Oral Daily   rosuvastatin  10 mg Oral Daily   sodium bicarbonate  650 mg Oral Daily   tamsulosin  0.4 mg Oral Daily    sodium chloride 10 mL/hr at 12/24/21 1231  sodium chloride, acetaminophen **OR** acetaminophen, ipratropium-albuterol, methocarbamol, mouth rinse, oxyCODONE-acetaminophen, prochlorperazine

## 2021-12-29 NOTE — Progress Notes (Signed)
Mobility Specialist - Progress Note   12/29/21 1426  Mobility  Activity Ambulated with assistance in hallway  Level of Assistance Contact guard assist, steadying assist  Assistive Device Front wheel walker  Distance Ambulated (ft) 34 ft  Range of Motion/Exercises Active  Activity Response Tolerated well  Mobility Referral Yes  $Mobility charge 1 Mobility   Pt was found in bed and agreeable to ambulate. Pt had swelling on both his feet and RN was made aware. He c/o neck, shoulder, and head pain stating that it occurs when he moves his neck. During ambulation c/o a "catch" on his R knee, when asked if it was pain he stated it did not feel like pain. At EOS returned to recliner chair with necessities in reach.  Ryan Jenkins Mobility Specialist

## 2021-12-30 LAB — CBC
HCT: 27.2 % — ABNORMAL LOW (ref 39.0–52.0)
Hemoglobin: 8.6 g/dL — ABNORMAL LOW (ref 13.0–17.0)
MCH: 29.3 pg (ref 26.0–34.0)
MCHC: 31.6 g/dL (ref 30.0–36.0)
MCV: 92.5 fL (ref 80.0–100.0)
Platelets: 390 10*3/uL (ref 150–400)
RBC: 2.94 MIL/uL — ABNORMAL LOW (ref 4.22–5.81)
RDW: 15.8 % — ABNORMAL HIGH (ref 11.5–15.5)
WBC: 5.8 10*3/uL (ref 4.0–10.5)
nRBC: 0 % (ref 0.0–0.2)

## 2021-12-30 LAB — BASIC METABOLIC PANEL
Anion gap: 9 (ref 5–15)
BUN: 54 mg/dL — ABNORMAL HIGH (ref 8–23)
CO2: 20 mmol/L — ABNORMAL LOW (ref 22–32)
Calcium: 8.4 mg/dL — ABNORMAL LOW (ref 8.9–10.3)
Chloride: 111 mmol/L (ref 98–111)
Creatinine, Ser: 3.74 mg/dL — ABNORMAL HIGH (ref 0.61–1.24)
GFR, Estimated: 18 mL/min — ABNORMAL LOW (ref >60)
Glucose, Bld: 127 mg/dL — ABNORMAL HIGH (ref 70–99)
Potassium: 3.7 mmol/L (ref 3.5–5.1)
Sodium: 140 mmol/L (ref 135–145)

## 2021-12-30 LAB — GLUCOSE, CAPILLARY
Glucose-Capillary: 119 mg/dL — ABNORMAL HIGH (ref 70–99)
Glucose-Capillary: 144 mg/dL — ABNORMAL HIGH (ref 70–99)

## 2021-12-30 MED ORDER — FUROSEMIDE 40 MG PO TABS: 40.0000 mg | ORAL_TABLET | Freq: Two times a day (BID) | ORAL | 0 refills | Status: DC

## 2021-12-30 MED ORDER — PROCHLORPERAZINE MALEATE 5 MG PO TABS: 5.0000 mg | ORAL_TABLET | Freq: Four times a day (QID) | ORAL | 0 refills | Status: DC | PRN

## 2021-12-30 NOTE — Progress Notes (Signed)
Pt discharged to home. Prior to dc, IV and tele was removed. Pt was given dc paperwork regarding medications, follow up appointments , and condition. Pt verbalized understanding and stated no other concerns at this time. Pt stable at time of DC and left in personal vehicle driven by brother.

## 2021-12-30 NOTE — TOC Transition Note (Signed)
Transition of Care [facility]) - CM/SW Discharge Note   Patient Details  Name: JULUIS FITZSIMMONS MRN: [id number] Date of Birth: Nov 05, 1960  Transition of Care [facility]) CM/SW Contact:  Illene Regulus, LCSW Phone Number: 12/30/2021, 1:20 PM   Clinical Narrative:     TOC cSW spoke with pt about recommendations for [facility] services. Pt stated he has [facility] in place already but does not know the agency. He requests that this CSW call him when he gets home to get an update on the agency, as he does not remember the name.      Barriers to Discharge: Continued Medical Work up   Patient Goals and CMS Choice Patient states their goals for this hospitalization and ongoing recovery are:: to go home      Discharge Placement                       Discharge Plan and Services   Discharge Planning Services: CM Consult                                 Social Determinants of Health (SDOH) Interventions Food Insecurity Interventions: Intervention Not Indicated Housing Interventions: Intervention Not Indicated Transportation Interventions: Intervention Not Indicated Utilities Interventions: Intervention Not Indicated   Readmission Risk Interventions    11/29/2021    4:16 PM  Readmission Risk Prevention Plan  Transportation Screening Complete  Medication Review (RN Care Manager) Complete  HRI or [place] Complete  SW Recovery Care/Counseling Consult Complete  Palliative Care Screening Not [place] Not Applicable

## 2021-12-30 NOTE — Discharge Summary (Signed)
Physician Discharge Summary  [name]:[id number] DOB: 25-Nov-1960 DOA: 12/22/2021  PCP: [name], MD  Admit date: 12/22/2021 Discharge date: 12/30/2021  Admitted From: Home Disposition:  Home  Discharge Condition:Stable CODE STATUS:FULL Diet recommendation: Heart Healthy  Brief/Interim Summary: 61 year old male with a history of chronic kidney disease stage IV, diastolic heart failure, recently in the hospital for decompensated CHF and was released last month after significant diuresis, comes to the hospital with cough and shortness of breath.  X-ray shows multifocal pneumonia.   Started on IV antibiotics.  He was also noted to have worsening renal function.  Felt to be hypovolemic.  Started on IV fluids.  Nephrology was following.  He was noted to have urinary retention.  Had Foley catheter placed with coud catheter.  Discussed with his urologist who recommended continuing catheter on discharge until he can follow-up.  Creatinine has improved with iv fluids,now stopped.  He has been on room air since last several days.  He still has bilateral lower extremity edema, compression stockings of bed.  He will be discharged to be continued on Lasix, we have recommended to follow-up with his PCP nephrologist and as well as urology as an outpatient in 1 to 2 weeks.  He should do BMP testing in a week to check his kidney function.  Following problems were addressed during his hospitalization:  Multifocal pneumonia -Treated  ceftriaxone and azithromycin,completed course -strep pneumo antigen negative. Legionella antigen negative -oxygen requirements up to 11L, now has been weaned down to RA   AKI on CKD stage IV -Baseline creatinine approximately 4 -Creatinine on admission noted to be 4.9 , now improved back to the range of 3 -Nephrology was following -Renal ultrasound negative for obstructive process -Continue Lasix on discharge.  Check BMP in a week -He needs to follow-up with  his own nephrologist in 1 to 2 weeks as an outpatient.   Chronic diastolic congestive heart failure -Has significant bilateral lower extremity edema -Ordered compression stockings bilaterally, elevated legs while lying on bed    Normal anion gap metabolic acidosis -Likely related to renal failure -On oral bicarb   Anemia chronic kidney disease -Current hemoglobin in the range of 7-8 -Baseline hemoglobin appears to run between 8-9 -no reported bleeding -transfused with 1 unit of prbc with follow-up hemoglobin improved to the range of 8 during this hospitalization   Gastroparesis -Continue on as needed Compazine   Insulin-dependent diabetes -Continue home regimen   Hyperlipidemia -Continue on statin   Hypertension -Chronically on amlodipine, hydralazine, metoprolol -Dose of medications increased but still on the high range -Would recommend to closely monitor his blood pressure at home.  Follow-up with PCP for titrating the medications if needed   ESBL Klebsiella UTI -Present on admission -Urine culture shows Klebsiella ESBL, similar to culture from 11/2021.  He does not have any dysuria at this time.  Suspect his bladder is colonized with this bacteria. No indication for treatment unless he becomes symptomatic or has fevers.   BPH with bladder outlet obstruction -Status post TURP 10/2021 -Noted to have postvoid residuals around 400 cc -Catheter placement with coud catheter performed -Discussed with his primary urologist, [name] who reported that patient has an areflexic bladder and he had mentioned to the patient that TURP may not help his urinary retention issues -He has recommended to continue catheter on discharge until he can follow-up with urology as an outpatient -Continue on Flomax and finasteride   Generalized weakness PT/OT recommending home health on discharge  Discharge Diagnoses:  Principal Problem:   Multifocal pneumonia Active Problems:   Acute on  chronic diastolic CHF (congestive heart failure) (HCC)   Type 2 diabetes mellitus with peripheral neuropathy (HCC)   Essential hypertension   Hyperlipidemia   Gastroparesis   Gastroesophageal reflux disease without esophagitis   Acute renal failure superimposed on stage 4 chronic kidney disease (HCC)   Coronary artery disease   BPH (benign prostatic hyperplasia)   Anemia in chronic kidney disease (CKD)    Discharge Instructions  Discharge Instructions     Diet - low sodium heart healthy   Complete by: As directed    Discharge instructions   Complete by: As directed    1)Please take your medications as instructed 2)Monitor your blood pressure at home 3)Use compression stockings, elevate your legs while lying on the bed with pillows 4) follow-up with your PCP in a week.Do a BMP test to check your kidney function during the follow-up 5)Follow up with the nephrologist in 1 to 2 weeks. 6)you are being discharged with Foley catheter.  Follow-up with your urologist in a week   Increase activity slowly   Complete by: As directed       Allergies as of 12/30/2021       Reactions   Claritin [loratadine] Swelling   Joint swelling   Hydrochlorothiazide Other (See Comments)   Dizziness   Latex Hives   Lyrica [pregabalin] Other (See Comments)   Depression. "Makes me loopy"    Metformin And Related Nausea And Vomiting        Medication List     STOP taking these medications    atorvastatin 40 MG tablet Commonly known as: LIPITOR   oxyCODONE-acetaminophen 5-325 MG tablet Commonly known as: Percocet       TAKE these medications    acetaminophen 500 MG tablet Commonly known as: TYLENOL Take 1,000 mg by mouth as needed for mild pain or headache.   amLODipine 10 MG tablet Commonly known as: NORVASC Take 1 tablet (10 mg total) by mouth daily.   clopidogrel 75 MG tablet Commonly known as: PLAVIX Take 1 tablet (75 mg total) by mouth daily.   escitalopram 20 MG  tablet Commonly known as: LEXAPRO Take 1 tablet (20 mg total) by mouth daily.   famotidine 40 MG tablet Commonly known as: PEPCID Take 40 mg by mouth daily.   finasteride 5 MG tablet Commonly known as: PROSCAR Take 1 tablet (5 mg total) by mouth daily.   furosemide 40 MG tablet Commonly known as: Lasix Take 1 tablet (40 mg total) by mouth 2 (two) times daily.   gabapentin 300 MG capsule Commonly known as: NEURONTIN Take 1 capsule (300 mg total) by mouth daily as needed (neuropathic pain). What changed: when to take this   hydrALAZINE 100 MG tablet Commonly known as: APRESOLINE Take 1 tablet (100 mg total) by mouth 3 (three) times daily. What changed: when to take this   Lantus SoloStar 100 UNIT/ML Solostar Pen Generic drug: insulin glargine Inject 22 Units into the skin at bedtime. What changed: how much to take   meclizine 25 MG tablet Commonly known as: ANTIVERT Take 25 mg by mouth 2 (two) times daily as needed for dizziness.   metoprolol succinate 50 MG 24 hr tablet Commonly known as: TOPROL-XL Take 1 tablet (50 mg total) by mouth at bedtime. Take with or immediately following a meal. What changed: when to take this   nortriptyline 25 MG capsule Commonly known as: PAMELOR Take 1  capsule (25 mg total) by mouth at bedtime. What changed: when to take this   pantoprazole 40 MG tablet Commonly known as: PROTONIX Take 1 tablet (40 mg total) by mouth daily.   potassium chloride 10 MEQ tablet Commonly known as: KLOR-CON M Take 1 tablet (10 mEq total) by mouth daily.   rosuvastatin 10 MG tablet Commonly known as: CRESTOR Take 10 mg by mouth daily.   sodium bicarbonate 650 MG tablet Take 650 mg by mouth daily.   tamsulosin 0.4 MG Caps capsule Commonly known as: FLOMAX Take 0.4 mg by mouth daily.   Vitamin D3 1.25 MG (50000 UT) Caps Take 50,000 Units by mouth once a week.        Follow-up Information     [name], MD. Schedule an appointment as  soon as possible for a visit in 1 week(s).   Specialty: Internal Medicine Contact information: [address] [facility] [place] [address] [phone]                Allergies  Allergen Reactions   Claritin [Loratadine] Swelling    Joint swelling    Hydrochlorothiazide Other (See Comments)    Dizziness   Latex Hives   Lyrica [Pregabalin] Other (See Comments)    Depression. "Makes me loopy"    Metformin And Related Nausea And Vomiting    Consultations: Nephrology, urology   Procedures/Studies: DG CHEST PORT 1 VIEW  Result Date: 12/25/2021 CLINICAL DATA:  Follow-up of multifocal pneumonia. EXAM: PORTABLE CHEST 1 VIEW COMPARISON:  December 22, 2021 FINDINGS: EKG leads project over the chest. Cardiomediastinal contours and hilar structures are stable. Lungs are clear. No pneumothorax. On limited assessment no acute skeletal process. IMPRESSION: No acute cardiopulmonary disease. Resolution of findings on previous imaging may reflect resolution of pulmonary edema or infection. Electronically Signed   By: Zetta Bills M.D.   On: 12/25/2021 13:28   ECHOCARDIOGRAM COMPLETE  Result Date: 12/23/2021    ECHOCARDIOGRAM REPORT   Patient Name:   Ryan Jenkins Date of Exam: 12/23/2021 Medical Rec #:  [id number]         Height:       74.0 in Accession #:    [id number]        Weight:       206.0 lb Date of Birth:  01/08/1961         BSA:          2.201 m Patient Age:    88 years          BP:           142/77 mmHg Patient Gender: M                 HR:           91 bpm. Exam Location:  Inpatient Procedure: 2D Echo, Cardiac Doppler and Color Doppler Indications:    Q22.29 Acute diastolic (congestive) heart failure  History:        Patient has prior history of Echocardiogram examinations, most                 recent 11/26/2021. CHF, CAD; Risk Factors:Diabetes, Dyslipidemia                 and Hypertension. CKD.  Sonographer:    Eartha Inch Referring Phys: [id number] Jonnie Finner   Sonographer Comments: Image acquisition challenging due to patient body habitus and Image acquisition challenging due to respiratory motion. IMPRESSIONS  1. Left  ventricular ejection fraction, by estimation, is 50 to 55%. The left ventricle has low normal function. The left ventricle demonstrates global hypokinesis. There is moderate concentric left ventricular hypertrophy. Left ventricular diastolic parameters are consistent with Grade II diastolic dysfunction (pseudonormalization).  2. Right ventricular systolic function is mildly reduced. The right ventricular size is mildly enlarged. Tricuspid regurgitation signal is inadequate for assessing PA pressure.  3. Left atrial size was mildly dilated.  4. The mitral valve is grossly normal. Trivial mitral valve regurgitation. No evidence of mitral stenosis.  5. The aortic valve is tricuspid. Aortic valve regurgitation is not visualized. Aortic valve sclerosis is present, with no evidence of aortic valve stenosis.  6. The inferior vena cava is dilated in size with >50% respiratory variability, suggesting right atrial pressure of 8 mmHg. Comparison(s): LVEF 50-55% on this study, slightly reduced from 55-60. FINDINGS  Left Ventricle: Left ventricular ejection fraction, by estimation, is 50 to 55%. The left ventricle has low normal function. The left ventricle demonstrates global hypokinesis. The left ventricular internal cavity size was normal in size. There is moderate concentric left ventricular hypertrophy. Left ventricular diastolic parameters are consistent with Grade II diastolic dysfunction (pseudonormalization). Right Ventricle: The right ventricular size is mildly enlarged. No increase in right ventricular wall thickness. Right ventricular systolic function is mildly reduced. Tricuspid regurgitation signal is inadequate for assessing PA pressure. Left Atrium: Left atrial size was mildly dilated. Right Atrium: Right atrial size was normal in size. Pericardium:  Trivial pericardial effusion is present. Mitral Valve: The mitral valve is grossly normal. Trivial mitral valve regurgitation. No evidence of mitral valve stenosis. MV peak gradient, 6.7 mmHg. The mean mitral valve gradient is 3.0 mmHg. Tricuspid Valve: The tricuspid valve is grossly normal. Tricuspid valve regurgitation is trivial. No evidence of tricuspid stenosis. Aortic Valve: The aortic valve is tricuspid. Aortic valve regurgitation is not visualized. Aortic valve sclerosis is present, with no evidence of aortic valve stenosis. Pulmonic Valve: The pulmonic valve was grossly normal. Pulmonic valve regurgitation is trivial. No evidence of pulmonic stenosis. Aorta: The aortic root and ascending aorta are structurally normal, with no evidence of dilitation. Venous: The inferior vena cava is dilated in size with greater than 50% respiratory variability, suggesting right atrial pressure of 8 mmHg. IAS/Shunts: The atrial septum is grossly normal.  LEFT VENTRICLE PLAX 2D LVIDd:         3.80 cm      Diastology LVIDs:         3.00 cm      LV e' medial:    4.87 cm/s LV PW:         1.60 cm      LV E/e' medial:  24.8 LV IVS:        1.60 cm      LV e' lateral:   10.80 cm/s LVOT diam:     2.30 cm      LV E/e' lateral: 11.2 LV SV:         64 LV SV Index:   29 LVOT Area:     4.15 cm  LV Volumes (MOD) LV vol d, MOD A2C: 153.0 ml LV vol d, MOD A4C: 198.0 ml LV vol s, MOD A2C: 88.8 ml LV vol s, MOD A4C: 125.0 ml LV SV MOD A2C:     64.2 ml LV SV MOD A4C:     198.0 ml LV SV MOD BP:      62.0 ml RIGHT VENTRICLE  IVC RV S prime:     8.56 cm/s  IVC diam: 2.10 cm TAPSE (M-mode): 1.4 cm LEFT ATRIUM             Index        RIGHT ATRIUM           Index LA diam:        4.70 cm 2.14 cm/m   RA Area:     15.10 cm LA Vol (A2C):   85.6 ml 38.89 ml/m  RA Volume:   35.50 ml  16.13 ml/m LA Vol (A4C):   73.1 ml 33.21 ml/m LA Biplane Vol: 79.3 ml 36.03 ml/m  AORTIC VALVE LVOT Vmax:   80.10 cm/s LVOT Vmean:  61.600 cm/s LVOT VTI:     0.153 m  AORTA Ao Root diam: 2.80 cm Ao Asc diam:  3.40 cm MITRAL VALVE MV Area (PHT): 6.83 cm     SHUNTS MV Area VTI:   2.41 cm     Systemic VTI:  0.15 m MV Peak grad:  6.7 mmHg     Systemic Diam: 2.30 cm MV Mean grad:  3.0 mmHg MV Vmax:       1.29 m/s MV Vmean:      80.0 cm/s MV Decel Time: 111 msec MV E velocity: 121.00 cm/s MV A velocity: 62.60 cm/s MV E/A ratio:  1.93 Eleonore Chiquito MD Electronically signed by Eleonore Chiquito MD Signature Date/Time: 12/23/2021/11:33:37 AM    Final    DG Chest 2 View  Result Date: 12/22/2021 CLINICAL DATA:  61 year old male with shortness of breath, abdominal pain, malaise. EXAM: CHEST - 2 VIEW COMPARISON:  Chest radiographs 11/25/2021 and earlier. FINDINGS: Semi upright AP and lateral views of the chest at 0708 hours. New widespread although indistinct right lung opacity, more conspicuous in the lower lobe on the lateral view. Possible early lung consolidation, no definite air bronchograms. Stable somewhat low lung volumes from last month. No pleural effusion. Mediastinal contours stable and within normal limits. Visualized tracheal air column is within normal limits. Left lung appears to remain negative. No acute osseous abnormality identified. Negative visible bowel gas. IMPRESSION: New widespread although indistinct right lung opacity, somewhat confluent in the lower lobe on the lateral view and suspicious for multilobar Pneumonia in this setting. No pleural effusion. If there are clinical signs/symptoms of infection then Followup PA and lateral chest X-ray is recommended in 3-4 weeks following trial of antibiotic therapy to ensure resolution and exclude underlying malignancy. Electronically Signed   By: Genevie Ann M.D.   On: 12/22/2021 07:31      Subjective: Patient seen and examined at bedside today.  Hemodynamically stable.  On room air.  Very concerned about his bilateral lower extremity swelling.  Long discussion had at the bedside along with the sister on phone  regarding the importance of compression stockings.    Discharge Exam: Vitals:   12/30/21 0221 12/30/21 0558  BP: (!) 162/85 (!) 166/89  Pulse: 76 73  Resp: 16 17  Temp:  98 F (36.7 C)  SpO2: 98% 100%   Vitals:   12/29/21 1153 12/29/21 2222 12/30/21 0221 12/30/21 0558  BP: (!) 174/86 (!) 151/79 (!) 162/85 (!) 166/89  Pulse: 93 76 76 73  Resp: '18 18 16 17  '$ Temp: 98.4 F (36.9 C) 98.1 F (36.7 C)  98 F (36.7 C)  TempSrc: Oral Oral  Oral  SpO2: 100% 100% 98% 100%  Weight:      Height:  General: Pt is alert, awake, not in acute distress Cardiovascular: RRR, S1/S2 +, no rubs, no gallops Respiratory: CTA bilaterally, no wheezing, no rhonchi Abdominal: Soft, NT, ND, bowel sounds + Extremities: no edema, no cyanosis, bilateral lower extremity pitting edema GU: Foley    The results of significant diagnostics from this hospitalization (including imaging, microbiology, ancillary and laboratory) are listed below for reference.     Microbiology: Recent Results (from the past 240 hour(s))  Resp Panel by RT-PCR (Flu A&B, Covid) Anterior Nasal Swab     Status: None   Collection Time: 12/22/21  7:30 AM   Specimen: Anterior Nasal Swab  Result Value Ref Range Status   SARS Coronavirus 2 by RT PCR NEGATIVE NEGATIVE Final    Comment: (NOTE) SARS-CoV-2 target nucleic acids are NOT DETECTED.  The SARS-CoV-2 RNA is generally detectable in upper respiratory specimens during the acute phase of infection. The lowest concentration of SARS-CoV-2 viral copies this assay can detect is 138 copies/mL. A negative result does not preclude SARS-Cov-2 infection and should not be used as the sole basis for treatment or other patient management decisions. A negative result may occur with  improper specimen collection/handling, submission of specimen other than nasopharyngeal swab, presence of viral mutation(s) within the areas targeted by this assay, and inadequate number of  viral copies(<138 copies/mL). A negative result must be combined with clinical observations, patient history, and epidemiological information. The expected result is Negative.  Fact Sheet for Patients:  EntrepreneurPulse.com.au  Fact Sheet for Healthcare Providers:  IncredibleEmployment.be  This test is no t yet approved or cleared by the [place] FDA and  has been authorized for detection and/or diagnosis of SARS-CoV-2 by FDA under an Emergency Use Authorization (EUA). This EUA will remain  in effect (meaning this test can be used) for the duration of the COVID-19 declaration under Section 564(b)(1) of the Act, 21 U.S.C.section 360bbb-3(b)(1), unless the authorization is terminated  or revoked sooner.       Influenza A by PCR NEGATIVE NEGATIVE Final   Influenza B by PCR NEGATIVE NEGATIVE Final    Comment: (NOTE) The Xpert Xpress SARS-CoV-2/FLU/RSV plus assay is intended as an aid in the diagnosis of influenza from Nasopharyngeal swab specimens and should not be used as a sole basis for treatment. Nasal washings and aspirates are unacceptable for Xpert Xpress SARS-CoV-2/FLU/RSV testing.  Fact Sheet for Patients: EntrepreneurPulse.com.au  Fact Sheet for Healthcare Providers: IncredibleEmployment.be  This test is not yet approved or cleared by the [place] FDA and has been authorized for detection and/or diagnosis of SARS-CoV-2 by FDA under an Emergency Use Authorization (EUA). This EUA will remain in effect (meaning this test can be used) for the duration of the COVID-19 declaration under Section 564(b)(1) of the Act, 21 U.S.C. section 360bbb-3(b)(1), unless the authorization is terminated or revoked.  Performed at [facility], [place] [address] [place], [place] [address]   Urine Culture     Status: Abnormal   Collection Time: 12/22/21  7:30 AM   Specimen: Urine, Clean  Catch  Result Value Ref Range Status   Specimen Description   Final    URINE, CLEAN CATCH Performed at [facility], [place] [address], [place], [place] [address]    Special Requests   Final    NONE Performed at [facility], [place] [address], [place], [place] [address]    Culture (A)  Final    >=100,000 COLONIES/mL KLEBSIELLA PNEUMONIAE Confirmed Extended Spectrum Beta-Lactamase Producer (ESBL).  In bloodstream infections from ESBL organisms, carbapenems are preferred over piperacillin/tazobactam. They are shown to have a lower risk of mortality.    Report Status 12/26/2021 FINAL  Final   Organism ID, Bacteria KLEBSIELLA PNEUMONIAE (A)  Final      Susceptibility   Klebsiella pneumoniae - MIC*    AMPICILLIN >=32 RESISTANT Resistant     CEFAZOLIN >=64 RESISTANT Resistant     CEFEPIME >=32 RESISTANT Resistant     CEFTRIAXONE >=64 RESISTANT Resistant     CIPROFLOXACIN >=4 RESISTANT Resistant     GENTAMICIN <=1 SENSITIVE Sensitive     IMIPENEM <=0.25 SENSITIVE Sensitive     NITROFURANTOIN 64 INTERMEDIATE Intermediate     TRIMETH/SULFA >=320 RESISTANT Resistant     AMPICILLIN/SULBACTAM >=32 RESISTANT Resistant     PIP/TAZO 16 SENSITIVE Sensitive     * >=100,000 COLONIES/mL KLEBSIELLA PNEUMONIAE     Labs: BNP (last 3 results) Recent Labs    11/24/21 1656 11/25/21 1222 12/22/21 0730  BNP 1,341.9* 1,426.9* 6,761.9*   Basic Metabolic Panel: Recent Labs  Lab 12/25/21 0404 12/26/21 0346 12/27/21 0409 12/28/21 0423 12/29/21 0412 12/30/21 0527  NA 135 135 136 138 142 140  K 4.2 3.9 3.9 3.7 3.7 3.7  CL 104 104 106 111 111 111  CO2 17* 18* 18* 18* 19* 20*  GLUCOSE 149* 145* 115* 142* 99 127*  BUN 93* 98* 88* 79* 61* 54*  CREATININE 6.74* 6.29* 6.04* 4.76* 4.05* 3.74*  CALCIUM 8.2* 7.9* 8.2* 8.5* 8.6* 8.4*  PHOS 6.1* 5.9* 6.5* 5.2*  --   --    Liver Function Tests: Recent Labs  Lab 12/25/21 0404 12/26/21 0346 12/27/21 0409  12/28/21 0423  ALBUMIN 3.1* 2.8* 2.6* 2.7*   No results for input(s): "LIPASE", "AMYLASE" in the last 168 hours. No results for input(s): "AMMONIA" in the last 168 hours. CBC: Recent Labs  Lab 12/25/21 0404 12/26/21 0346 12/27/21 0409 12/28/21 0423 12/30/21 0527  WBC 6.2 5.9 5.4 6.0 5.8  HGB 8.1* 7.9* 7.9* 8.2* 8.6*  HCT 25.7* 24.8* 25.2* 26.0* 27.2*  MCV 92.1 92.2 92.6 92.2 92.5  PLT 242 258 274 322 390   Cardiac Enzymes: No results for input(s): "CKTOTAL", "CKMB", "CKMBINDEX", "TROPONINI" in the last 168 hours. BNP: Invalid input(s): "POCBNP" CBG: Recent Labs  Lab 12/29/21 0822 12/29/21 1150 12/29/21 1641 12/29/21 2221 12/30/21 0811  GLUCAP 98 113* 145* 161* 119*   D-Dimer No results for input(s): "DDIMER" in the last 72 hours. Hgb A1c No results for input(s): "HGBA1C" in the last 72 hours. Lipid Profile No results for input(s): "CHOL", "HDL", "LDLCALC", "TRIG", "CHOLHDL", "LDLDIRECT" in the last 72 hours. Thyroid function studies No results for input(s): "TSH", "T4TOTAL", "T3FREE", "THYROIDAB" in the last 72 hours.  Invalid input(s): "FREET3" Anemia work up No results for input(s): "VITAMINB12", "FOLATE", "FERRITIN", "TIBC", "IRON", "RETICCTPCT" in the last 72 hours. Urinalysis    Component Value Date/Time   COLORURINE YELLOW 12/22/2021 0730   APPEARANCEUR CLOUDY (A) 12/22/2021 0730   LABSPEC 1.016 12/22/2021 0730   PHURINE 5.0 12/22/2021 0730   GLUCOSEU 50 (A) 12/22/2021 0730   HGBUR SMALL (A) 12/22/2021 0730   BILIRUBINUR NEGATIVE 12/22/2021 0730   KETONESUR NEGATIVE 12/22/2021 0730   PROTEINUR >=300 (A) 12/22/2021 0730   NITRITE NEGATIVE 12/22/2021 0730   LEUKOCYTESUR LARGE (A) 12/22/2021 0730   Sepsis Labs Recent Labs  Lab 12/26/21 0346 12/27/21 0409 12/28/21 0423 12/30/21 0527  WBC 5.9 5.4 6.0 5.8   Microbiology Recent Results (from the past 240 hour(s))  Resp Panel by RT-PCR (Flu A&B, Covid) Anterior Nasal Swab     Status: None    Collection Time: 12/22/21  7:30 AM   Specimen: Anterior Nasal Swab  Result Value Ref Range Status   SARS Coronavirus 2 by RT PCR NEGATIVE NEGATIVE Final    Comment: (NOTE) SARS-CoV-2 target nucleic acids are NOT DETECTED.  The SARS-CoV-2 RNA is generally detectable in upper respiratory specimens during the acute phase of infection. The lowest concentration of SARS-CoV-2 viral copies this assay can detect is 138 copies/mL. A negative result does not preclude SARS-Cov-2 infection and should not be used as the sole basis for treatment or other patient management decisions. A negative result may occur with  improper specimen collection/handling, submission of specimen other than nasopharyngeal swab, presence of viral mutation(s) within the areas targeted by this assay, and inadequate number of viral copies(<138 copies/mL). A negative result must be combined with clinical observations, patient history, and epidemiological information. The expected result is Negative.  Fact Sheet for Patients:  EntrepreneurPulse.com.au  Fact Sheet for Healthcare Providers:  IncredibleEmployment.be  This test is no t yet approved or cleared by the [place] FDA and  has been authorized for detection and/or diagnosis of SARS-CoV-2 by FDA under an Emergency Use Authorization (EUA). This EUA will remain  in effect (meaning this test can be used) for the duration of the COVID-19 declaration under Section 564(b)(1) of the Act, 21 U.S.C.section 360bbb-3(b)(1), unless the authorization is terminated  or revoked sooner.       Influenza A by PCR NEGATIVE NEGATIVE Final   Influenza B by PCR NEGATIVE NEGATIVE Final    Comment: (NOTE) The Xpert Xpress SARS-CoV-2/FLU/RSV plus assay is intended as an aid in the diagnosis of influenza from Nasopharyngeal swab specimens and should not be used as a sole basis for treatment. Nasal washings and aspirates are unacceptable for  Xpert Xpress SARS-CoV-2/FLU/RSV testing.  Fact Sheet for Patients: EntrepreneurPulse.com.au  Fact Sheet for Healthcare Providers: IncredibleEmployment.be  This test is not yet approved or cleared by the [place] FDA and has been authorized for detection and/or diagnosis of SARS-CoV-2 by FDA under an Emergency Use Authorization (EUA). This EUA will remain in effect (meaning this test can be used) for the duration of the COVID-19 declaration under Section 564(b)(1) of the Act, 21 U.S.C. section 360bbb-3(b)(1), unless the authorization is terminated or revoked.  Performed at [facility], [place] [address] [place], [place] [address]   Urine Culture     Status: Abnormal   Collection Time: 12/22/21  7:30 AM   Specimen: Urine, Clean Catch  Result Value Ref Range Status   Specimen Description   Final    URINE, CLEAN CATCH Performed at [facility], [place] [address] [place], [place] [address]    Special Requests   Final    NONE Performed at [facility], [place] [address], [place], [place] [address]    Culture (A)  Final    >=100,000 COLONIES/mL KLEBSIELLA PNEUMONIAE Confirmed Extended Spectrum Beta-Lactamase Producer (ESBL).  In bloodstream infections from ESBL organisms, carbapenems are preferred over piperacillin/tazobactam. They are shown to have a lower risk of mortality.    Report Status 12/26/2021 FINAL  Final   Organism ID, Bacteria KLEBSIELLA PNEUMONIAE (A)  Final      Susceptibility   Klebsiella pneumoniae - MIC*    AMPICILLIN >=32 RESISTANT Resistant     CEFAZOLIN >=64 RESISTANT Resistant     CEFEPIME >=32 RESISTANT Resistant  CEFTRIAXONE >=64 RESISTANT Resistant     CIPROFLOXACIN >=4 RESISTANT Resistant     GENTAMICIN <=1 SENSITIVE Sensitive     IMIPENEM <=0.25 SENSITIVE Sensitive     NITROFURANTOIN 64 INTERMEDIATE Intermediate     TRIMETH/SULFA >=320 RESISTANT Resistant      AMPICILLIN/SULBACTAM >=32 RESISTANT Resistant     PIP/TAZO 16 SENSITIVE Sensitive     * >=100,000 COLONIES/mL KLEBSIELLA PNEUMONIAE    Please note: You were cared for by a hospitalist during your hospital stay. Once you are discharged, your primary care physician will handle any further medical issues. Please note that NO REFILLS for any discharge medications will be authorized once you are discharged, as it is imperative that you return to your primary care physician (or establish a relationship with a primary care physician if you do not have one) for your post hospital discharge needs so that they can reassess your need for medications and monitor your lab values.    Time coordinating discharge: 40 minutes  SIGNED:   Shelly Coss, MD  [facility] 12/30/2021, 10:50 AM Pager [phone]  If 7PM-7AM, please contact night-coverage [email or web address] Password [email or web address]

## 2022-01-02 ENCOUNTER — Other Ambulatory Visit: Payer: Self-pay

## 2022-01-24 ENCOUNTER — Emergency Department (HOSPITAL_COMMUNITY): Payer: Medicaid Other

## 2022-01-24 ENCOUNTER — Encounter (HOSPITAL_COMMUNITY): Payer: Self-pay

## 2022-01-24 ENCOUNTER — Inpatient Hospital Stay (HOSPITAL_COMMUNITY)
Admission: EM | Admit: 2022-01-24 | Discharge: 2022-01-26 | DRG: 291 | Disposition: A | Discharge status: Home Health | Payer: Medicaid Other | Attending: Internal Medicine | Admitting: Internal Medicine

## 2022-01-24 DIAGNOSIS — N184 Chronic kidney disease, stage 4 (severe): Secondary | ICD-10-CM

## 2022-01-24 DIAGNOSIS — I5043 Acute on chronic combined systolic (congestive) and diastolic (congestive) heart failure: Secondary | ICD-10-CM | POA: Diagnosis present

## 2022-01-24 DIAGNOSIS — I2510 Atherosclerotic heart disease of native coronary artery without angina pectoris: Secondary | ICD-10-CM | POA: Diagnosis present

## 2022-01-24 DIAGNOSIS — E782 Mixed hyperlipidemia: Secondary | ICD-10-CM

## 2022-01-24 DIAGNOSIS — E785 Hyperlipidemia, unspecified: Secondary | ICD-10-CM | POA: Diagnosis present

## 2022-01-24 DIAGNOSIS — Z8481 Family history of carrier of genetic disease: Secondary | ICD-10-CM

## 2022-01-24 DIAGNOSIS — I5023 Acute on chronic systolic (congestive) heart failure: Secondary | ICD-10-CM | POA: Insufficient documentation

## 2022-01-24 DIAGNOSIS — E1122 Type 2 diabetes mellitus with diabetic chronic kidney disease: Secondary | ICD-10-CM | POA: Diagnosis present

## 2022-01-24 DIAGNOSIS — Z79899 Other long term (current) drug therapy: Secondary | ICD-10-CM

## 2022-01-24 DIAGNOSIS — Z794 Long term (current) use of insulin: Secondary | ICD-10-CM | POA: Diagnosis not present

## 2022-01-24 DIAGNOSIS — F32A Depression, unspecified: Secondary | ICD-10-CM | POA: Diagnosis present

## 2022-01-24 DIAGNOSIS — Z832 Family history of diseases of the blood and blood-forming organs and certain disorders involving the immune mechanism: Secondary | ICD-10-CM | POA: Diagnosis not present

## 2022-01-24 DIAGNOSIS — I130 Hypertensive heart and chronic kidney disease with heart failure and stage 1 through stage 4 chronic kidney disease, or unspecified chronic kidney disease: Principal | ICD-10-CM | POA: Diagnosis present

## 2022-01-24 DIAGNOSIS — Z7902 Long term (current) use of antithrombotics/antiplatelets: Secondary | ICD-10-CM | POA: Diagnosis not present

## 2022-01-24 DIAGNOSIS — Z806 Family history of leukemia: Secondary | ICD-10-CM

## 2022-01-24 DIAGNOSIS — E1140 Type 2 diabetes mellitus with diabetic neuropathy, unspecified: Secondary | ICD-10-CM | POA: Diagnosis present

## 2022-01-24 DIAGNOSIS — Z823 Family history of stroke: Secondary | ICD-10-CM | POA: Diagnosis not present

## 2022-01-24 DIAGNOSIS — Z1152 Encounter for screening for COVID-19: Secondary | ICD-10-CM | POA: Diagnosis not present

## 2022-01-24 DIAGNOSIS — I5033 Acute on chronic diastolic (congestive) heart failure: Secondary | ICD-10-CM

## 2022-01-24 DIAGNOSIS — R3914 Feeling of incomplete bladder emptying: Secondary | ICD-10-CM | POA: Diagnosis not present

## 2022-01-24 DIAGNOSIS — I10 Essential (primary) hypertension: Principal | ICD-10-CM

## 2022-01-24 DIAGNOSIS — Z800 Family history of malignant neoplasm of digestive organs: Secondary | ICD-10-CM

## 2022-01-24 DIAGNOSIS — N400 Enlarged prostate without lower urinary tract symptoms: Secondary | ICD-10-CM | POA: Diagnosis present

## 2022-01-24 DIAGNOSIS — K219 Gastro-esophageal reflux disease without esophagitis: Secondary | ICD-10-CM | POA: Diagnosis present

## 2022-01-24 DIAGNOSIS — E538 Deficiency of other specified B group vitamins: Secondary | ICD-10-CM | POA: Diagnosis present

## 2022-01-24 DIAGNOSIS — E119 Type 2 diabetes mellitus without complications: Secondary | ICD-10-CM

## 2022-01-24 DIAGNOSIS — Z7984 Long term (current) use of oral hypoglycemic drugs: Secondary | ICD-10-CM

## 2022-01-24 DIAGNOSIS — Z8249 Family history of ischemic heart disease and other diseases of the circulatory system: Secondary | ICD-10-CM | POA: Diagnosis not present

## 2022-01-24 DIAGNOSIS — D631 Anemia in chronic kidney disease: Secondary | ICD-10-CM | POA: Diagnosis present

## 2022-01-24 DIAGNOSIS — F419 Anxiety disorder, unspecified: Secondary | ICD-10-CM | POA: Diagnosis present

## 2022-01-24 DIAGNOSIS — R531 Weakness: Secondary | ICD-10-CM | POA: Diagnosis not present

## 2022-01-24 DIAGNOSIS — Z8051 Family history of malignant neoplasm of kidney: Secondary | ICD-10-CM

## 2022-01-24 DIAGNOSIS — N189 Chronic kidney disease, unspecified: Secondary | ICD-10-CM | POA: Diagnosis present

## 2022-01-24 DIAGNOSIS — Z9079 Acquired absence of other genital organ(s): Secondary | ICD-10-CM

## 2022-01-24 DIAGNOSIS — J810 Acute pulmonary edema: Secondary | ICD-10-CM

## 2022-01-24 DIAGNOSIS — N401 Enlarged prostate with lower urinary tract symptoms: Secondary | ICD-10-CM | POA: Diagnosis present

## 2022-01-24 LAB — COMPREHENSIVE METABOLIC PANEL
ALT: 13 U/L (ref 0–44)
AST: 17 U/L (ref 15–41)
Albumin: 3.7 g/dL (ref 3.5–5.0)
Alkaline Phosphatase: 110 U/L (ref 38–126)
Anion gap: 12 (ref 5–15)
BUN: 51 mg/dL — ABNORMAL HIGH (ref 8–23)
CO2: 20 mmol/L — ABNORMAL LOW (ref 22–32)
Calcium: 8.9 mg/dL (ref 8.9–10.3)
Chloride: 108 mmol/L (ref 98–111)
Creatinine, Ser: 4.49 mg/dL — ABNORMAL HIGH (ref 0.61–1.24)
GFR, Estimated: 14 mL/min — ABNORMAL LOW (ref >60)
Glucose, Bld: 101 mg/dL — ABNORMAL HIGH (ref 70–99)
Potassium: 4.8 mmol/L (ref 3.5–5.1)
Sodium: 140 mmol/L (ref 135–145)
Total Bilirubin: 1 mg/dL (ref 0.3–1.2)
Total Protein: 7.5 g/dL (ref 6.5–8.1)

## 2022-01-24 LAB — CBC
HCT: 31.5 % — ABNORMAL LOW (ref 39.0–52.0)
Hemoglobin: 10 g/dL — ABNORMAL LOW (ref 13.0–17.0)
MCH: 29 pg (ref 26.0–34.0)
MCHC: 31.7 g/dL (ref 30.0–36.0)
MCV: 91.3 fL (ref 80.0–100.0)
Platelets: 366 10*3/uL (ref 150–400)
RBC: 3.45 MIL/uL — ABNORMAL LOW (ref 4.22–5.81)
RDW: 17.1 % — ABNORMAL HIGH (ref 11.5–15.5)
WBC: 8.8 10*3/uL (ref 4.0–10.5)
nRBC: 0 % (ref 0.0–0.2)

## 2022-01-24 LAB — RESP PANEL BY RT-PCR (RSV, FLU A&B, COVID)  RVPGX2
Influenza A by PCR: NEGATIVE
Influenza B by PCR: NEGATIVE
Resp Syncytial Virus by PCR: NEGATIVE
SARS Coronavirus 2 by RT PCR: NEGATIVE

## 2022-01-24 LAB — TROPONIN I (HIGH SENSITIVITY)
Troponin I (High Sensitivity): 54 ng/L — ABNORMAL HIGH (ref <18)
Troponin I (High Sensitivity): 57 ng/L — ABNORMAL HIGH (ref <18)

## 2022-01-24 LAB — BRAIN NATRIURETIC PEPTIDE: B Natriuretic Peptide: 1653.7 pg/mL — ABNORMAL HIGH (ref 0.0–100.0)

## 2022-01-24 MED ORDER — ACETAMINOPHEN 650 MG RE SUPP: 650.0000 mg | Freq: Four times a day (QID) | RECTAL | Status: DC | PRN

## 2022-01-24 MED ORDER — FUROSEMIDE 10 MG/ML IJ SOLN
80.0000 mg | Freq: Once | INTRAMUSCULAR | Status: AC
  Administered 2022-01-24: 80 mg via INTRAVENOUS
  Filled 2022-01-24: qty 8

## 2022-01-24 MED ORDER — ACETAMINOPHEN 325 MG PO TABS: 650.0000 mg | ORAL_TABLET | Freq: Four times a day (QID) | ORAL | Status: DC | PRN

## 2022-01-24 MED ORDER — FUROSEMIDE 10 MG/ML IJ SOLN
80.0000 mg | Freq: Two times a day (BID) | INTRAMUSCULAR | Status: DC
  Administered 2022-01-25 – 2022-01-26 (×3): 80 mg via INTRAVENOUS
  Filled 2022-01-24 (×3): qty 8

## 2022-01-24 MED ORDER — MELATONIN 3 MG PO TABS
3.0000 mg | ORAL_TABLET | Freq: Every evening | ORAL | Status: DC | PRN
  Administered 2022-01-25: 3 mg via ORAL
  Filled 2022-01-24: qty 1

## 2022-01-24 NOTE — H&P (Signed)
History and Physical      Ryan Jenkins:[id number] DOB: 08-20-1960 DOA: 01/24/2022  PCP: [name], MD *** Patient coming from: home ***  I have personally briefly reviewed patient's old medical records in [place]  Chief Complaint: ***  HPI: Ryan Jenkins is a 62 y.o. male with medical history significant for *** who is admitted to [facility] on 01/24/2022 with *** after presenting from home*** to [facility] ED complaining of ***.   ***        ***  ED Course:  Vital signs in the ED were notable for the following: ***  Labs were notable for the following: ***  Per my interpretation, EKG in ED demonstrated the following:  ***  Imaging and additional notable ED work-up: ***  While in the ED, the following were administered: ***  Subsequently, the patient was admitted  ***  ***red   Review of Systems: As per HPI otherwise 10 point review of systems negative.   Past Medical History:  Diagnosis Date   Anemia    Anginal pain ([place])    Anxiety    Arthritis    BPH with obstruction/lower urinary tract symptoms    CAD (coronary artery disease)    cardiologist--- dr Junius Roads badal;  11-06-2019 cardiac cath in [place] (result in care everywhere)  nonobstructive cad involing pRCA 60% (done in setting worseing CHF/ acute pulmonary edema requiring intubation/ AKI   Chronic combined systolic and diastolic CHF (congestive heart failure) ([employer])    Chronic kidney disease, stage IV (severe) ([place])    nephrologist--- dr Carolin Sicks   Depression    Diabetic neuropathy ([place])    Dyspnea    Edema of both lower extremities    GERD (gastroesophageal reflux disease)    Heart murmur    History of community acquired pneumonia    admission 06-04-2021 in [facility]  w/ ARF hypoxia w/ severe sepsis   Hyperlipidemia    Hypertension    Insulin dependent type 2 diabetes mellitus ([place])    Pneumonia    Retinopathy due to secondary diabetes ([place])    Uses walker    Vitamin  B12 deficiency    Vitreous hemorrhage [facility])     Past Surgical History:  Procedure Laterality Date   CARDIAC CATHETERIZATION  11/06/2019   [place] in [place];    nonobstructive CAD , pRCA 60% (result in care everywhere)   CATARACT EXTRACTION W/ INTRAOCULAR LENS IMPLANT Bilateral 2017   TRANSURETHRAL RESECTION OF PROSTATE N/A 10/16/2021   Procedure: TRANSURETHRAL RESECTION OF THE PROSTATE (TURP);  Surgeon: Janith Lima, MD;  Location: [facility] ORS;  Service: Urology;  Laterality: N/A;    Social History:  reports that he has never smoked. He has never used smokeless tobacco. He reports that he does not drink alcohol and does not use drugs.   Allergies  Allergen Reactions   Claritin [Loratadine] Swelling    Joint swelling    Hydrochlorothiazide Other (See Comments)    Dizziness   Latex Hives   Lyrica [Pregabalin] Other (See Comments)    Depression. "Makes me loopy"    Metformin And Related Nausea And Vomiting    Family History  Problem Relation Age of Onset   Renal cancer Mother    Hypertension Mother    Pancreatic cancer Mother    Hypertension Sister    Stroke Sister    Leukemia Maternal Uncle    Sickle cell trait Maternal Aunt    Colon cancer Neg Hx  Esophageal cancer Neg Hx    Rectal cancer Neg Hx    Stomach cancer Neg Hx     Family history reviewed and not pertinent ***   Prior to Admission medications   Medication Sig Start Date End Date Taking? Authorizing Provider  acetaminophen (TYLENOL) 500 MG tablet Take 1,000 mg by mouth as needed for mild pain or headache.    [provider]  amLODipine (NORVASC) 10 MG tablet Take 1 tablet (10 mg total) by mouth daily. 02/08/21   Danford, Suann Larry, MD  Cholecalciferol (VITAMIN D3) 1.25 MG (50000 UT) CAPS Take 50,000 Units by mouth once a week. 09/12/21   [provider]  clopidogrel (PLAVIX) 75 MG tablet Take 1 tablet (75 mg total) by mouth daily. 07/05/21   de Yolanda Manges, Cortney E, NP   escitalopram (LEXAPRO) 20 MG tablet Take 1 tablet (20 mg total) by mouth daily. 07/20/20   Charlott Rakes, MD  famotidine (PEPCID) 40 MG tablet Take 40 mg by mouth daily. 08/08/21   [provider]  finasteride (PROSCAR) 5 MG tablet Take 1 tablet (5 mg total) by mouth daily. 07/20/20   Charlott Rakes, MD  furosemide (LASIX) 40 MG tablet Take 1 tablet (40 mg total) by mouth 2 (two) times daily. 12/30/21 01/29/22  Shelly Coss, MD  gabapentin (NEURONTIN) 300 MG capsule Take 1 capsule (300 mg total) by mouth daily as needed (neuropathic pain). Patient taking differently: Take 300 mg by mouth 3 (three) times daily. 11/29/21   Barton Dubois, MD  hydrALAZINE (APRESOLINE) 100 MG tablet Take 1 tablet (100 mg total) by mouth 3 (three) times daily. Patient taking differently: Take 100 mg by mouth 2 (two) times daily. 02/08/21   Danford, Suann Larry, MD  LANTUS SOLOSTAR 100 UNIT/ML Solostar Pen Inject 22 Units into the skin at bedtime. Patient taking differently: Inject 40 Units into the skin at bedtime. 11/29/21   Barton Dubois, MD  meclizine (ANTIVERT) 25 MG tablet Take 25 mg by mouth 2 (two) times daily as needed for dizziness. 08/08/21   [provider]  metoprolol succinate (TOPROL-XL) 50 MG 24 hr tablet Take 1 tablet (50 mg total) by mouth at bedtime. Take with or immediately following a meal. Patient taking differently: Take 50 mg by mouth daily. Take with or immediately following a meal. 02/08/21   Danford, Suann Larry, MD  nortriptyline (PAMELOR) 25 MG capsule Take 1 capsule (25 mg total) by mouth at bedtime. Patient taking differently: Take 25 mg by mouth daily. 07/20/20   Charlott Rakes, MD  pantoprazole (PROTONIX) 40 MG tablet Take 1 tablet (40 mg total) by mouth daily. 02/08/21   Danford, Suann Larry, MD  potassium chloride (KLOR-CON M) 10 MEQ tablet Take 1 tablet (10 mEq total) by mouth daily. 02/08/21   Danford, Suann Larry, MD  prochlorperazine (COMPAZINE) 5 MG tablet  Take 1 tablet (5 mg total) by mouth every 6 (six) hours as needed for nausea or vomiting. 12/30/21   Shelly Coss, MD  rosuvastatin (CRESTOR) 10 MG tablet Take 10 mg by mouth daily.    [provider]  sodium bicarbonate 650 MG tablet Take 650 mg by mouth daily. 09/20/21   [provider]  tamsulosin (FLOMAX) 0.4 MG CAPS capsule Take 0.4 mg by mouth daily. 05/25/21   [provider]     Objective    Physical Exam: Vitals:   01/24/22 2100 01/24/22 2145 01/24/22 2200 01/24/22 2245  BP:  (!) 178/100 (!) 141/78 (!) 158/78  Pulse: 98  98 97 93  Resp: '15 20 13 17  '$ Temp:   98.1 F (36.7 C)   TempSrc:      SpO2: 97% 97% 96% 96%    General: appears to be stated age; alert, oriented Skin: warm, dry, no rash Head:  AT/[place] Mouth:  Oral mucosa membranes appear moist, normal dentition Neck: supple; trachea midline Heart:  RRR; did not appreciate any M/R/G Lungs: CTAB, did not appreciate any wheezes, rales, or rhonchi Abdomen: + BS; soft, ND, NT Vascular: 2+ pedal pulses b/l; 2+ radial pulses b/l Extremities: no peripheral edema, no muscle wasting Neuro: strength and sensation intact in upper and lower extremities b/l    *** Neuro: 5/5 strength of the proximal and distal flexors and extensors of the upper and lower extremities bilaterally; sensation intact in upper and lower extremities b/l; cranial nerves II through XII grossly intact; no pronator drift; no evidence suggestive of slurred speech, dysarthria, or facial droop; Normal muscle tone. No tremors. *** Neuro: In the setting of the patient's current mental status and associated inability to follow instructions, unable to perform full neurologic exam at this time.  As such, assessment of strength, sensation, and cranial nerves is limited at this time. Patient noted to spontaneously move all 4 extremities. No tremors.  ***    Labs on Admission: I have personally reviewed following labs and imaging  studies  CBC: Recent Labs  Lab 01/24/22 1527  WBC 8.8  HGB 10.0*  HCT 31.5*  MCV 91.3  PLT 962   Basic Metabolic Panel: Recent Labs  Lab 01/24/22 1527  NA 140  K 4.8  CL 108  CO2 20*  GLUCOSE 101*  BUN 51*  CREATININE 4.49*  CALCIUM 8.9   GFR: CrCl cannot be calculated (Unknown ideal weight.). Liver Function Tests: Recent Labs  Lab 01/24/22 1527  AST 17  ALT 13  ALKPHOS 110  BILITOT 1.0  PROT 7.5  ALBUMIN 3.7   No results for input(s): "LIPASE", "AMYLASE" in the last 168 hours. No results for input(s): "AMMONIA" in the last 168 hours. Coagulation Profile: No results for input(s): "INR", "PROTIME" in the last 168 hours. Cardiac Enzymes: No results for input(s): "CKTOTAL", "CKMB", "CKMBINDEX", "TROPONINI" in the last 168 hours. BNP (last 3 results) No results for input(s): "PROBNP" in the last 8760 hours. HbA1C: No results for input(s): "HGBA1C" in the last 72 hours. CBG: No results for input(s): "GLUCAP" in the last 168 hours. Lipid Profile: No results for input(s): "CHOL", "HDL", "LDLCALC", "TRIG", "CHOLHDL", "LDLDIRECT" in the last 72 hours. Thyroid Function Tests: No results for input(s): "TSH", "T4TOTAL", "FREET4", "T3FREE", "THYROIDAB" in the last 72 hours. Anemia Panel: No results for input(s): "VITAMINB12", "FOLATE", "FERRITIN", "TIBC", "IRON", "RETICCTPCT" in the last 72 hours. Urine analysis:    Component Value Date/Time   COLORURINE YELLOW 12/22/2021 0730   APPEARANCEUR CLOUDY (A) 12/22/2021 0730   LABSPEC 1.016 12/22/2021 0730   PHURINE 5.0 12/22/2021 0730   GLUCOSEU 50 (A) 12/22/2021 0730   HGBUR SMALL (A) 12/22/2021 0730   BILIRUBINUR NEGATIVE 12/22/2021 0730   KETONESUR NEGATIVE 12/22/2021 0730   PROTEINUR >=300 (A) 12/22/2021 0730   NITRITE NEGATIVE 12/22/2021 0730   LEUKOCYTESUR LARGE (A) 12/22/2021 0730    Radiological Exams on Admission: CT CHEST WO CONTRAST  Result Date: 01/24/2022 CLINICAL DATA:  Respiratory illness. Recent  rapid weight gain with shortness of breath. EXAM: CT CHEST WITHOUT CONTRAST TECHNIQUE: Multidetector CT imaging of the chest was performed following the standard protocol without IV contrast. RADIATION  DOSE REDUCTION: This exam was performed according to the departmental dose-optimization program which includes automated exposure control, adjustment of the mA and/or kV according to patient size and/or use of iterative reconstruction technique. COMPARISON:  Radiographs 01/24/2022 and 12/25/2021. Abdominal CT 10/31/2021. No prior chest CT. FINDINGS: Cardiovascular: Mild aortic and coronary artery atherosclerosis. No acute vascular findings on noncontrast imaging. There is a small amount of pericardial fluid versus pericardial thickening. The heart size is normal. Mediastinum/Nodes: Numerous small mediastinal lymph nodes do not appear pathologically enlarged. Hilar assessment is limited by the lack of intravenous contrast.No axillary adenopathy. The thyroid gland, trachea and esophagus demonstrate no significant findings. Lungs/Pleura: Small bilateral pleural effusions. Patchy non focal ground-glass opacities in both lungs which may reflect edema. No confluent airspace opacity or suspicious pulmonary nodule. Upper abdomen: Small retroperitoneal lymph nodes do not appear pathologically enlarged. No ascites. Musculoskeletal/Chest wall: New generalized soft tissue edema suspicious for anasarca. Moderate bilateral gynecomastia. No chest wall mass or suspicious osseous abnormality. IMPRESSION: 1. New generalized soft tissue edema suspicious for anasarca. Small bilateral pleural effusions with patchy non focal ground-glass opacities in both lungs which may reflect edema. Findings suggest congestive heart failure. Viral infection considered less likely. Correlate clinically. 2. No focal airspace opacity or suspicious pulmonary nodule. 3.  Aortic Atherosclerosis (ICD10-I70.0). Electronically Signed   By: Richardean Sale M.D.    On: 01/24/2022 18:38   DG Chest 2 View  Result Date: 01/24/2022 CLINICAL DATA:  Chest pain and shortness of breath EXAM: CHEST - 2 VIEW COMPARISON:  Chest x-ray 12/25/2021 FINDINGS: The heart size and mediastinal contours are within normal limits. Both lungs are clear. The visualized skeletal structures are unremarkable. IMPRESSION: No active cardiopulmonary disease. Electronically Signed   By: Ronney Asters M.D.   On: 01/24/2022 15:06      Assessment/Plan    Principal Problem:   Acute on chronic systolic heart failure (HCC)  ***      ***          ***           ***          ***          ***          ***          ***          ***          ***          ***     ***  DVT prophylaxis: SCD's ***  Code Status: Full code*** Family Communication: none*** Disposition Plan: Per Rounding Team Consults called: none***;  Admission status: ***    I SPENT GREATER THAN 75 *** MINUTES IN CLINICAL CARE TIME/MEDICAL DECISION-MAKING IN COMPLETING THIS ADMISSION.     [place] DO [facility] [place]   01/24/2022, 11:37 PM   ***

## 2022-01-24 NOTE — ED Notes (Signed)
Pt ambulated in room utilizing RW w/o assist. Per pt, ambulates w/ RW at baseline. No LOB noted. Steady gait. O2 sat > 92% on RA during ambulation. Pt c/o "feeling a little lightheaded and weak" while ambulating" Pt c/o "headache" after ambulating. No c/o pain.

## 2022-01-24 NOTE — ED Triage Notes (Signed)
Pt arrived via EMS, home health states 18-20 lb weight gain in 24 hrs. Endorses some SOB but able to speak in full sentences.

## 2022-01-24 NOTE — ED Provider Notes (Signed)
[facility] DEPT Provider Note   CSN: [id number] Arrival date & time: 01/24/22  1429     History  Chief Complaint  Patient presents with   Shortness of Breath    Ryan Jenkins is a 62 y.o. male.  HPI     Home Medications Prior to Admission medications   Medication Sig Start Date End Date Taking? Authorizing Provider  acetaminophen (TYLENOL) 500 MG tablet Take 1,000 mg by mouth as needed for mild pain or headache.    [provider]  amLODipine (NORVASC) 10 MG tablet Take 1 tablet (10 mg total) by mouth daily. 02/08/21   Danford, Suann Larry, MD  Cholecalciferol (VITAMIN D3) 1.25 MG (50000 UT) CAPS Take 50,000 Units by mouth once a week. 09/12/21   [provider]  clopidogrel (PLAVIX) 75 MG tablet Take 1 tablet (75 mg total) by mouth daily. 07/05/21   de Yolanda Manges, Cortney E, NP  escitalopram (LEXAPRO) 20 MG tablet Take 1 tablet (20 mg total) by mouth daily. 07/20/20   Charlott Rakes, MD  famotidine (PEPCID) 40 MG tablet Take 40 mg by mouth daily. 08/08/21   [provider]  finasteride (PROSCAR) 5 MG tablet Take 1 tablet (5 mg total) by mouth daily. 07/20/20   Charlott Rakes, MD  furosemide (LASIX) 40 MG tablet Take 1 tablet (40 mg total) by mouth 2 (two) times daily. 12/30/21 01/29/22  Shelly Coss, MD  gabapentin (NEURONTIN) 300 MG capsule Take 1 capsule (300 mg total) by mouth daily as needed (neuropathic pain). Patient taking differently: Take 300 mg by mouth 3 (three) times daily. 11/29/21   Barton Dubois, MD  hydrALAZINE (APRESOLINE) 100 MG tablet Take 1 tablet (100 mg total) by mouth 3 (three) times daily. Patient taking differently: Take 100 mg by mouth 2 (two) times daily. 02/08/21   Danford, Suann Larry, MD  LANTUS SOLOSTAR 100 UNIT/ML Solostar Pen Inject 22 Units into the skin at bedtime. Patient taking differently: Inject 40 Units into the skin at bedtime. 11/29/21   Barton Dubois, MD  meclizine (ANTIVERT)  25 MG tablet Take 25 mg by mouth 2 (two) times daily as needed for dizziness. 08/08/21   [provider]  metoprolol succinate (TOPROL-XL) 50 MG 24 hr tablet Take 1 tablet (50 mg total) by mouth at bedtime. Take with or immediately following a meal. Patient taking differently: Take 50 mg by mouth daily. Take with or immediately following a meal. 02/08/21   Danford, Suann Larry, MD  nortriptyline (PAMELOR) 25 MG capsule Take 1 capsule (25 mg total) by mouth at bedtime. Patient taking differently: Take 25 mg by mouth daily. 07/20/20   Charlott Rakes, MD  pantoprazole (PROTONIX) 40 MG tablet Take 1 tablet (40 mg total) by mouth daily. 02/08/21   Danford, Suann Larry, MD  potassium chloride (KLOR-CON M) 10 MEQ tablet Take 1 tablet (10 mEq total) by mouth daily. 02/08/21   Danford, Suann Larry, MD  prochlorperazine (COMPAZINE) 5 MG tablet Take 1 tablet (5 mg total) by mouth every 6 (six) hours as needed for nausea or vomiting. 12/30/21   Shelly Coss, MD  rosuvastatin (CRESTOR) 10 MG tablet Take 10 mg by mouth daily.    [provider]  sodium bicarbonate 650 MG tablet Take 650 mg by mouth daily. 09/20/21   [provider]  tamsulosin (FLOMAX) 0.4 MG CAPS capsule Take 0.4 mg by mouth daily. 05/25/21   [provider]      Allergies    Claritin [loratadine],  Hydrochlorothiazide, Latex, Lyrica [pregabalin], and Metformin and related    Review of Systems   Review of Systems  Physical Exam Updated Vital Signs BP (!) 171/81   Pulse 90   Temp 98.1 F (36.7 C) (Oral)   Resp 20   SpO2 100%  Physical Exam  ED Results / Procedures / Treatments   Labs (all labs ordered are listed, but only abnormal results are displayed) Labs Reviewed  CBC - Abnormal; Notable for the following components:      Result Value   RBC 3.45 (*)    Hemoglobin 10.0 (*)    HCT 31.5 (*)    RDW 17.1 (*)    All other components within normal limits  BRAIN NATRIURETIC PEPTIDE -  Abnormal; Notable for the following components:   B Natriuretic Peptide 1,653.7 (*)    All other components within normal limits  COMPREHENSIVE METABOLIC PANEL - Abnormal; Notable for the following components:   CO2 20 (*)    Glucose, Bld 101 (*)    BUN 51 (*)    Creatinine, Ser 4.49 (*)    GFR, Estimated 14 (*)    All other components within normal limits  TROPONIN I (HIGH SENSITIVITY) - Abnormal; Notable for the following components:   Troponin I (High Sensitivity) 54 (*)    All other components within normal limits  TROPONIN I (HIGH SENSITIVITY)    EKG None  Radiology DG Chest 2 View  Result Date: 01/24/2022 CLINICAL DATA:  Chest pain and shortness of breath EXAM: CHEST - 2 VIEW COMPARISON:  Chest x-ray 12/25/2021 FINDINGS: The heart size and mediastinal contours are within normal limits. Both lungs are clear. The visualized skeletal structures are unremarkable. IMPRESSION: No active cardiopulmonary disease. Electronically Signed   By: Ronney Asters M.D.   On: 01/24/2022 15:06    Procedures Procedures    Medications Ordered in ED Medications - No data to display  ED Course/ Medical Decision Making/ A&P                           Medical Decision Making Amount and/or Complexity of Data Reviewed Radiology: ordered.  Risk Prescription drug management. Decision regarding hospitalization.   Patient with known history of combined congestive heart failure.  Seems to be a presentation acute on chronic.  Patient has been admitted about once a month.  Admitted October 23 November 11 and December 8 for exacerbation of CHF.  Patient not with a strong oxygen requirement here with ambulation will go down around about 90%.  But he does feel lightheaded and does have shortness of breath.  CT chest was done because regular chest x-ray was negative.  The CT chest is consistent with congestive heart failure.  Patient given 80 mg of Lasix IV.  Patient's blood pressure also elevated upon  presentation it was 858 systolic now currently 850 systolic.  This is exacerbating the pulmonary me as well.  His BNP is at 16,000 and looks like at the worse it gets up to around 18,000.  But it is moving in that direction.  COVID panel RSV influenza panel negative.  His troponins x 2 [phone] no acute changes there.  Complete metabolic panel significant for GFR 14.  However he is known to have chronic kidney disease stage IV.  In obvious outpatient pushing of Lasix could make that significantly worse.  Discussed with hospitalist they agree with admission for diuresis and better blood pressure control. Final Clinical Impression(s) /  ED Diagnoses Final diagnoses:  None    Rx / DC Orders ED Discharge Orders     None         Fredia Sorrow, MD 01/24/22 2340

## 2022-01-24 NOTE — ED Provider Triage Note (Signed)
Emergency Medicine Provider Triage Evaluation Note  Ryan Jenkins , a 62 y.o. male  was evaluated in triage.  Pt complains of increased weight gain, fatigue, bilateral leg swelling, abdominal swelling, and SOB for the last few days. Pt states he has gained 16 lbs in the last few days. Not on dialysis. Reports he takes furosemide '40mg'$  BID, and has been compliant. Also c/o chest pain  Review of Systems  Positive: SOB, abdominal pain Negative: V/D  Physical Exam  There were no vitals taken for this visit. Gen:   Awake, no distress   Resp:  Normal effort  MSK:   Moves extremities without difficulty  Other:  BLE 2+ pitting edema, +hard abdomen  Medical Decision Making  Medically screening exam initiated at 2:39 PM.  Appropriate orders placed.  JAKUB DEBOLD was informed that the remainder of the evaluation will be completed by another provider, this initial triage assessment does not replace that evaluation, and the importance of remaining in the ED until their evaluation is complete.     Osvaldo Shipper, [place] 01/24/22 1443

## 2022-01-25 ENCOUNTER — Encounter (HOSPITAL_COMMUNITY): Payer: Self-pay | Admitting: Internal Medicine

## 2022-01-25 ENCOUNTER — Other Ambulatory Visit: Payer: Self-pay

## 2022-01-25 DIAGNOSIS — I5033 Acute on chronic diastolic (congestive) heart failure: Secondary | ICD-10-CM | POA: Diagnosis not present

## 2022-01-25 DIAGNOSIS — R531 Weakness: Secondary | ICD-10-CM

## 2022-01-25 LAB — COMPREHENSIVE METABOLIC PANEL WITH GFR
ALT: 11 U/L (ref 0–44)
AST: 14 U/L — ABNORMAL LOW (ref 15–41)
Albumin: 3.4 g/dL — ABNORMAL LOW (ref 3.5–5.0)
Alkaline Phosphatase: 96 U/L (ref 38–126)
Anion gap: 10 (ref 5–15)
BUN: 54 mg/dL — ABNORMAL HIGH (ref 8–23)
CO2: 22 mmol/L (ref 22–32)
Calcium: 8.9 mg/dL (ref 8.9–10.3)
Chloride: 108 mmol/L (ref 98–111)
Creatinine, Ser: 4.63 mg/dL — ABNORMAL HIGH (ref 0.61–1.24)
GFR, Estimated: 14 mL/min — ABNORMAL LOW
Glucose, Bld: 113 mg/dL — ABNORMAL HIGH (ref 70–99)
Potassium: 4.3 mmol/L (ref 3.5–5.1)
Sodium: 140 mmol/L (ref 135–145)
Total Bilirubin: 1 mg/dL (ref 0.3–1.2)
Total Protein: 7.3 g/dL (ref 6.5–8.1)

## 2022-01-25 LAB — GLUCOSE, CAPILLARY: Glucose-Capillary: 181 mg/dL — ABNORMAL HIGH (ref 70–99)

## 2022-01-25 LAB — URINALYSIS, COMPLETE (UACMP) WITH MICROSCOPIC
Bilirubin Urine: NEGATIVE
Glucose, UA: NEGATIVE mg/dL
Ketones, ur: NEGATIVE mg/dL
Leukocytes,Ua: NEGATIVE
Nitrite: NEGATIVE
Protein, ur: 100 mg/dL — AB
Specific Gravity, Urine: 1.005 (ref 1.005–1.030)
pH: 7 (ref 5.0–8.0)

## 2022-01-25 LAB — TSH: TSH: 3.01 u[IU]/mL (ref 0.350–4.500)

## 2022-01-25 LAB — CBC WITH DIFFERENTIAL/PLATELET
Abs Immature Granulocytes: 0.02 10*3/uL (ref 0.00–0.07)
Basophils Absolute: 0.1 10*3/uL (ref 0.0–0.1)
Basophils Relative: 1 %
Eosinophils Absolute: 0.2 10*3/uL (ref 0.0–0.5)
Eosinophils Relative: 3 %
HCT: 29 % — ABNORMAL LOW (ref 39.0–52.0)
Hemoglobin: 9.2 g/dL — ABNORMAL LOW (ref 13.0–17.0)
Immature Granulocytes: 0 %
Lymphocytes Relative: 18 %
Lymphs Abs: 1 10*3/uL (ref 0.7–4.0)
MCH: 28.9 pg (ref 26.0–34.0)
MCHC: 31.7 g/dL (ref 30.0–36.0)
MCV: 91.2 fL (ref 80.0–100.0)
Monocytes Absolute: 0.4 10*3/uL (ref 0.1–1.0)
Monocytes Relative: 6 %
Neutro Abs: 4.1 10*3/uL (ref 1.7–7.7)
Neutrophils Relative %: 72 %
Platelets: 333 10*3/uL (ref 150–400)
RBC: 3.18 MIL/uL — ABNORMAL LOW (ref 4.22–5.81)
RDW: 16.5 % — ABNORMAL HIGH (ref 11.5–15.5)
WBC: 5.8 10*3/uL (ref 4.0–10.5)
nRBC: 0 % (ref 0.0–0.2)

## 2022-01-25 LAB — MAGNESIUM
Magnesium: 1.6 mg/dL — ABNORMAL LOW (ref 1.7–2.4)
Magnesium: 1.7 mg/dL (ref 1.7–2.4)

## 2022-01-25 LAB — PHOSPHORUS: Phosphorus: 4.7 mg/dL — ABNORMAL HIGH (ref 2.5–4.6)

## 2022-01-25 LAB — CBG MONITORING, ED
Glucose-Capillary: 117 mg/dL — ABNORMAL HIGH (ref 70–99)
Glucose-Capillary: 130 mg/dL — ABNORMAL HIGH (ref 70–99)
Glucose-Capillary: 121 mg/dL — ABNORMAL HIGH (ref 70–99)

## 2022-01-25 MED ORDER — INSULIN ASPART 100 UNIT/ML IJ SOLN
0.0000 [IU] | Freq: Three times a day (TID) | INTRAMUSCULAR | Status: DC
  Administered 2022-01-25 – 2022-01-26 (×3): 1 [IU] via SUBCUTANEOUS
  Filled 2022-01-25: qty 0.09

## 2022-01-25 MED ORDER — ROSUVASTATIN CALCIUM 10 MG PO TABS
10.0000 mg | ORAL_TABLET | Freq: Every day | ORAL | Status: DC
  Administered 2022-01-25 – 2022-01-26 (×2): 10 mg via ORAL
  Filled 2022-01-25 (×2): qty 1

## 2022-01-25 MED ORDER — SODIUM BICARBONATE 650 MG PO TABS
650.0000 mg | ORAL_TABLET | Freq: Every day | ORAL | Status: DC
  Administered 2022-01-25 – 2022-01-26 (×2): 650 mg via ORAL
  Filled 2022-01-25 (×2): qty 1

## 2022-01-25 MED ORDER — INSULIN GLARGINE-YFGN 100 UNIT/ML ~~LOC~~ SOLN
20.0000 [IU] | Freq: Every day | SUBCUTANEOUS | Status: DC
  Administered 2022-01-25: 20 [IU] via SUBCUTANEOUS
  Filled 2022-01-25 (×2): qty 0.2

## 2022-01-25 MED ORDER — METOPROLOL SUCCINATE ER 50 MG PO TB24
50.0000 mg | ORAL_TABLET | Freq: Every day | ORAL | Status: DC
  Administered 2022-01-25 – 2022-01-26 (×2): 50 mg via ORAL
  Filled 2022-01-25 (×2): qty 1

## 2022-01-25 MED ORDER — TAMSULOSIN HCL 0.4 MG PO CAPS
0.4000 mg | ORAL_CAPSULE | Freq: Every day | ORAL | Status: DC
  Administered 2022-01-25 – 2022-01-26 (×2): 0.4 mg via ORAL
  Filled 2022-01-25 (×2): qty 1

## 2022-01-25 MED ORDER — CLOPIDOGREL BISULFATE 75 MG PO TABS
75.0000 mg | ORAL_TABLET | Freq: Every day | ORAL | Status: DC
  Administered 2022-01-25 – 2022-01-26 (×2): 75 mg via ORAL
  Filled 2022-01-25 (×2): qty 1

## 2022-01-25 MED ORDER — FINASTERIDE 5 MG PO TABS
5.0000 mg | ORAL_TABLET | Freq: Every day | ORAL | Status: DC
  Administered 2022-01-26: 5 mg via ORAL
  Filled 2022-01-25: qty 1

## 2022-01-25 MED ORDER — HYDRALAZINE HCL 25 MG PO TABS
100.0000 mg | ORAL_TABLET | Freq: Two times a day (BID) | ORAL | Status: DC
  Administered 2022-01-25 – 2022-01-26 (×3): 100 mg via ORAL
  Filled 2022-01-25 (×3): qty 4

## 2022-01-25 MED ORDER — AMLODIPINE BESYLATE 10 MG PO TABS
10.0000 mg | ORAL_TABLET | Freq: Every day | ORAL | Status: DC
  Administered 2022-01-25 – 2022-01-26 (×2): 10 mg via ORAL
  Filled 2022-01-25: qty 2
  Filled 2022-01-25: qty 1

## 2022-01-25 NOTE — ED Notes (Signed)
Pt c/o blurred vision in left eye after all the coughing he has been doing, provider notified.

## 2022-01-25 NOTE — ED Notes (Addendum)
Pt. Ambulated to the bathroom with walker without assistance and back to his room without difficulty.

## 2022-01-25 NOTE — Progress Notes (Signed)
PROGRESS NOTE    Ryan Jenkins  OVF:[id number]  DOB: 1960/09/25  DOA: 01/24/2022 PCP: [name], MD Outpatient Specialists:   Hospital course:  62 year old with HFpEF, CKD 4, DM 2 was admitted yesterday with decompensated HFpEF and anasarca.  Subjective:  Patient's main concern is that he be transferred up to her room today.  Notes his back pain is much worse lying on the stretcher.  Notes he is urinating a lot since he has been in the hospital, has filled out multiple urinals with clear urine.  Objective: Vitals:   01/25/22 1330 01/25/22 1400 01/25/22 1500 01/25/22 1600  BP: (!) 140/95 138/82 (!) 146/88 (!) 142/86  Pulse: 83 81 81 76  Resp: '14 18 18 15  '$ Temp:      TempSrc:      SpO2: 95% 94% 96% 98%   No intake or output data in the 24 hours ending 01/25/22 1618 There were no vitals filed for this visit.   Exam:  General: Patient looking older than stated age lying at 10 degrees in no respiratory distress. Eyes: sclera anicteric, conjuctiva mild injection bilaterally CVS: S1-S2, regular  Respiratory:  decreased air entry bilaterally with rales at bases bilaterally GI: NABS, soft, NT  LE: Warm and well-perfused, no edema noted Neuro: A/O x 3,  grossly nonfocal.  Psych: patient is logical and coherent, judgement and insight appear normal, mood and affect appropriate to situation.  Data Reviewed:  Basic Metabolic Panel: Recent Labs  Lab 01/24/22 1527 01/25/22 0500  NA 140 140  K 4.8 4.3  CL 108 108  CO2 20* 22  GLUCOSE 101* 113*  BUN 51* 54*  CREATININE 4.49* 4.63*  CALCIUM 8.9 8.9  MG  --  1.6*  PHOS  --  4.7*    CBC: Recent Labs  Lab 01/24/22 1527 01/25/22 0500  WBC 8.8 5.8  NEUTROABS  --  4.1  HGB 10.0* 9.2*  HCT 31.5* 29.0*  MCV 91.3 91.2  PLT 366 333     Scheduled Meds:  amLODipine  10 mg Oral Daily   clopidogrel  75 mg Oral Daily   finasteride  5 mg Oral Daily   furosemide  80 mg Intravenous BID   hydrALAZINE  100 mg  Oral BID   insulin aspart  0-9 Units Subcutaneous TID WC   insulin glargine-yfgn  20 Units Subcutaneous QHS   metoprolol succinate  50 mg Oral Daily   rosuvastatin  10 mg Oral Daily   sodium bicarbonate  650 mg Oral Daily   tamsulosin  0.4 mg Oral Daily   Continuous Infusions:   Assessment & Plan:   Acute on chronic decompensated HFpEF HTN Echocardiogram from last month showed EF of 50% with grade 2 diastolic dysfunction Also noted to have some right heart systolic dysfunction Patient is responding very well to Lasix 80 twice daily Blood pressure is holding steady, will need to follow fluid status closely given known right heart failure and relative preload dependence. Continue hydralazine for afterload reduction, continue metoprolol and amlodipine  CKD4 So far his creatinine is stable He is responding well to diuretics so no indication for acute initiation of dialysis He had been followed by renal at his last admission last month, can consider renal consultation if warranted  DM 2 Blood sugars under reasonable control on present medications  BPH Continue finasteride and tamsulosin   DVT prophylaxis: SCD  Code Status: Full Family Communication: None today   Studies: CT CHEST WO CONTRAST  Result  Date: 01/24/2022 CLINICAL DATA:  Respiratory illness. Recent rapid weight gain with shortness of breath. EXAM: CT CHEST WITHOUT CONTRAST TECHNIQUE: Multidetector CT imaging of the chest was performed following the standard protocol without IV contrast. RADIATION DOSE REDUCTION: This exam was performed according to the departmental dose-optimization program which includes automated exposure control, adjustment of the mA and/or kV according to patient size and/or use of iterative reconstruction technique. COMPARISON:  Radiographs 01/24/2022 and 12/25/2021. Abdominal CT 10/31/2021. No prior chest CT. FINDINGS: Cardiovascular: Mild aortic and coronary artery atherosclerosis. No acute  vascular findings on noncontrast imaging. There is a small amount of pericardial fluid versus pericardial thickening. The heart size is normal. Mediastinum/Nodes: Numerous small mediastinal lymph nodes do not appear pathologically enlarged. Hilar assessment is limited by the lack of intravenous contrast.No axillary adenopathy. The thyroid gland, trachea and esophagus demonstrate no significant findings. Lungs/Pleura: Small bilateral pleural effusions. Patchy non focal ground-glass opacities in both lungs which may reflect edema. No confluent airspace opacity or suspicious pulmonary nodule. Upper abdomen: Small retroperitoneal lymph nodes do not appear pathologically enlarged. No ascites. Musculoskeletal/Chest wall: New generalized soft tissue edema suspicious for anasarca. Moderate bilateral gynecomastia. No chest wall mass or suspicious osseous abnormality. IMPRESSION: 1. New generalized soft tissue edema suspicious for anasarca. Small bilateral pleural effusions with patchy non focal ground-glass opacities in both lungs which may reflect edema. Findings suggest congestive heart failure. Viral infection considered less likely. Correlate clinically. 2. No focal airspace opacity or suspicious pulmonary nodule. 3.  Aortic Atherosclerosis (ICD10-I70.0). Electronically Signed   By: Richardean Sale M.D.   On: 01/24/2022 18:38   DG Chest 2 View  Result Date: 01/24/2022 CLINICAL DATA:  Chest pain and shortness of breath EXAM: CHEST - 2 VIEW COMPARISON:  Chest x-ray 12/25/2021 FINDINGS: The heart size and mediastinal contours are within normal limits. Both lungs are clear. The visualized skeletal structures are unremarkable. IMPRESSION: No active cardiopulmonary disease. Electronically Signed   By: Ronney Asters M.D.   On: 01/24/2022 15:06    Principal Problem:   Acute on chronic diastolic CHF (congestive heart failure) (HCC) Active Problems:   DM2 (diabetes mellitus, type 2) (HCC)   HLD (hyperlipidemia)   CKD  (chronic kidney disease) stage 4, GFR 15-29 ml/min (HCC)   BPH (benign prostatic hyperplasia)   Anemia in chronic kidney disease (CKD)   Generalized weakness     Ryan Jenkins Shirely Toren, [facility]  If 7PM-7AM, please contact night-coverage [email or web address]   LOS: 1 day

## 2022-01-25 NOTE — ED Notes (Signed)
ED TO INPATIENT HANDOFF REPORT  ED Nurse Name and Phone #: [facility] Name/Age/Gender Ryan Jenkins 62 y.o. male Room/Bed: WA16/WA16  Code Status   Code Status: Full Code  Home/SNF/Other Home Patient oriented to: self, place, time, and situation Is this baseline? Yes   Triage Complete: Triage complete  Chief Complaint Acute on chronic systolic heart failure (Ryan Jenkins) [I50.23]  Triage Note Pt arrived via EMS, home health states 18-20 lb weight gain in 24 hrs. Endorses some SOB but able to speak in full sentences.   Allergies Allergies  Allergen Reactions   Claritin [Loratadine] Swelling    Joint swelling    Hydrochlorothiazide Other (See Comments)    Dizziness   Latex Hives and Swelling   Lyrica [Pregabalin] Other (See Comments)    Depression. "Makes me loopy"    Metformin And Related Diarrhea and Nausea And Vomiting    Level of Care/Admitting Diagnosis ED Disposition     ED Disposition  Admit   Condition  --   Comment  [facility] Area: [place] [[id number]]  Level of Care: Telemetry [5]  Admit to tele based on following criteria: Monitor for Ischemic changes  May admit patient to [facility] or [facility] if equivalent level of care is available:: No  Covid Evaluation: Asymptomatic - no recent exposure (last 10 days) testing not required  Diagnosis: Acute on chronic systolic heart failure (Ryan Jenkins) [428.23.ICD-9-CM]  Admitting Physician: Rhetta Mura [[id number]]  Attending Physician: Rhetta Mura [[id number]]  Certification:: I certify this patient will need inpatient services for at least 2 midnights  Estimated Length of Stay: 2          B Medical/Surgery History Past Medical History:  Diagnosis Date   Anemia    Anginal pain (Ryan Jenkins)    Anxiety    Arthritis    BPH with obstruction/lower urinary tract symptoms    CAD (coronary artery disease)    cardiologist--- dr Junius Roads badal;  11-06-2019 cardiac cath in [place] (result in  care everywhere)  nonobstructive cad involing pRCA 60% (done in setting worseing CHF/ acute pulmonary edema requiring intubation/ AKI   Chronic combined systolic and diastolic CHF (congestive heart failure) ([place])    Chronic kidney disease, stage IV (severe) ([place])    nephrologist--- dr Carolin Sicks   Depression    Diabetic neuropathy (Ryan Jenkins)    Dyspnea    Edema of both lower extremities    GERD (gastroesophageal reflux disease)    Heart murmur    History of community acquired pneumonia    admission 06-04-2021 in [facility]  w/ ARF hypoxia w/ severe sepsis   Hyperlipidemia    Hypertension    Insulin dependent type 2 diabetes mellitus (Ryan Jenkins)    Pneumonia    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)    Uses walker    Vitamin B12 deficiency    Vitreous hemorrhage [place])    Past Surgical History:  Procedure Laterality Date   CARDIAC CATHETERIZATION  11/06/2019   [place] in [place];    nonobstructive CAD , pRCA 60% (result in care everywhere)   CATARACT EXTRACTION W/ INTRAOCULAR LENS IMPLANT Bilateral 2017   TRANSURETHRAL RESECTION OF PROSTATE N/A 10/16/2021   Procedure: TRANSURETHRAL RESECTION OF THE PROSTATE (TURP);  Surgeon: Janith Lima, MD;  Location: [facility];  Service: Urology;  Laterality: N/A;     A IV Location/Drains/Wounds Patient Lines/Drains/Airways Status     Active Line/Drains/Airways     Name Placement date Placement time Site  Days   Peripheral IV 01/24/22 20 G 1" Left Antecubital 01/24/22  1755  Antecubital  1            Intake/Output Last 24 hours No intake or output data in the 24 hours ending 01/25/22 1630  Labs/Imaging Results for orders placed or performed during the Jenkins encounter of 01/24/22 (from the past 48 hour(s))  CBC     Status: Abnormal   Collection Time: 01/24/22  3:27 PM  Result Value Ref Range   WBC 8.8 4.0 - 10.5 K/uL   RBC 3.45 (L) 4.22 - 5.81 MIL/uL   Hemoglobin 10.0 (L) 13.0 - 17.0 g/dL   HCT 31.5 (L) 39.0 - 52.0 %   MCV  91.3 80.0 - 100.0 fL   MCH 29.0 26.0 - 34.0 pg   MCHC 31.7 30.0 - 36.0 g/dL   RDW 17.1 (H) 11.5 - 15.5 %   Platelets 366 150 - 400 K/uL   nRBC 0.0 0.0 - 0.2 %    Comment: Performed at [facility], [place] [address], [place], [place] [address]  Troponin I (Ryan Sensitivity)     Status: Abnormal   Collection Time: 01/24/22  3:27 PM  Result Value Ref Range   Troponin I (Ryan Sensitivity) 54 (H) <18 ng/L    Comment: (NOTE) Elevated Ryan sensitivity troponin I (hsTnI) values and significant  changes across serial measurements may suggest ACS but many other  chronic and acute conditions are known to elevate hsTnI results.  Refer to the "Links" section for chest pain algorithms and additional  guidance. Performed at [facility], [place] [address], [place], [place] [address]   Brain natriuretic peptide     Status: Abnormal   Collection Time: 01/24/22  3:27 PM  Result Value Ref Range   B Natriuretic Peptide 1,653.7 (H) 0.0 - 100.0 pg/mL    Comment: Performed at [facility] [place], [place] [address] [place], [place] [address]  Comprehensive metabolic panel     Status: Abnormal   Collection Time: 01/24/22  3:27 PM  Result Value Ref Range   Sodium 140 135 - 145 mmol/L   Potassium 4.8 3.5 - 5.1 mmol/L   Chloride 108 98 - 111 mmol/L   CO2 20 (L) 22 - 32 mmol/L   Glucose, Bld 101 (H) 70 - 99 mg/dL    Comment: Glucose reference range applies only to samples taken after fasting for at least 8 hours.   BUN 51 (H) 8 - 23 mg/dL   Creatinine, Ser 4.49 (H) 0.61 - 1.24 mg/dL   Calcium 8.9 8.9 - 10.3 mg/dL   Total Protein 7.5 6.5 - 8.1 g/dL   Albumin 3.7 3.5 - 5.0 g/dL   AST 17 15 - 41 U/L   ALT 13 0 - 44 U/L   Alkaline Phosphatase 110 38 - 126 U/L   Total Bilirubin 1.0 0.3 - 1.2 mg/dL   GFR, Estimated 14 (L) >60 mL/min    Comment: (NOTE) Calculated using the CKD-EPI Creatinine Equation (2021)    Anion gap 12 5 - 15    Comment: Performed at [facility], Ryan Jenkins [address] [place], [place] [address]  Troponin I (Ryan Sensitivity)     Status: Abnormal   Collection Time: 01/24/22  5:58 PM  Result Value Ref Range   Troponin I (Ryan Sensitivity) 57 (H) <18 ng/L    Comment: (NOTE) Elevated Ryan sensitivity troponin I (hsTnI) values and significant  changes across serial measurements may  suggest ACS but many other  chronic and acute conditions are known to elevate hsTnI results.  Refer to the "Links" section for chest pain algorithms and additional  guidance. Performed at [facility], [place] [address] [place], [place] [address]   Resp panel by RT-PCR (RSV, Flu A&B, Covid) Anterior Nasal Swab     Status: None   Collection Time: 01/24/22  9:52 PM   Specimen: Anterior Nasal Swab  Result Value Ref Range   SARS Coronavirus 2 by RT PCR NEGATIVE NEGATIVE    Comment: (NOTE) SARS-CoV-2 target nucleic acids are NOT DETECTED.  The SARS-CoV-2 RNA is generally detectable in upper respiratory specimens during the acute phase of infection. The lowest concentration of SARS-CoV-2 viral copies this assay can detect is 138 copies/mL. A negative result does not preclude SARS-Cov-2 infection and should not be used as the sole basis for treatment or other patient management decisions. A negative result may occur with  improper specimen collection/handling, submission of specimen other than nasopharyngeal swab, presence of viral mutation(s) within the areas targeted by this assay, and inadequate number of viral copies(<138 copies/mL). A negative result must be combined with clinical observations, patient history, and epidemiological information. The expected result is Negative.  Fact Sheet for Patients:  EntrepreneurPulse.com.au  Fact Sheet for Healthcare Providers:  IncredibleEmployment.be  This test is no t yet approved or cleared by the [place] FDA and  has been  authorized for detection and/or diagnosis of SARS-CoV-2 by FDA under an Emergency Use Authorization (EUA). This EUA will remain  in effect (meaning this test can be used) for the duration of the COVID-19 declaration under Section 564(b)(1) of the Act, 21 U.S.C.section 360bbb-3(b)(1), unless the authorization is terminated  or revoked sooner.       Influenza A by PCR NEGATIVE NEGATIVE   Influenza B by PCR NEGATIVE NEGATIVE    Comment: (NOTE) The Xpert Xpress SARS-CoV-2/FLU/RSV plus assay is intended as an aid in the diagnosis of influenza from Nasopharyngeal swab specimens and should not be used as a sole basis for treatment. Nasal washings and aspirates are unacceptable for Xpert Xpress SARS-CoV-2/FLU/RSV testing.  Fact Sheet for Patients: EntrepreneurPulse.com.au  Fact Sheet for Healthcare Providers: IncredibleEmployment.be  This test is not yet approved or cleared by the [place] FDA and has been authorized for detection and/or diagnosis of SARS-CoV-2 by FDA under an Emergency Use Authorization (EUA). This EUA will remain in effect (meaning this test can be used) for the duration of the COVID-19 declaration under Section 564(b)(1) of the Act, 21 U.S.C. section 360bbb-3(b)(1), unless the authorization is terminated or revoked.     Resp Syncytial Virus by PCR NEGATIVE NEGATIVE    Comment: (NOTE) Fact Sheet for Patients: EntrepreneurPulse.com.au  Fact Sheet for Healthcare Providers: IncredibleEmployment.be  This test is not yet approved or cleared by the [place] FDA and has been authorized for detection and/or diagnosis of SARS-CoV-2 by FDA under an Emergency Use Authorization (EUA). This EUA will remain in effect (meaning this test can be used) for the duration of the COVID-19 declaration under Section 564(b)(1) of the Act, 21 U.S.C. section 360bbb-3(b)(1), unless the authorization is  terminated or revoked.  Performed at [facility], [place] [address] [place], [place] [address]   Urinalysis, Complete w Microscopic Urine, Clean Catch     Status: Abnormal   Collection Time: 01/25/22  4:47 AM  Result Value Ref Range   Color, Urine COLORLESS (A) YELLOW   APPearance CLEAR CLEAR  Specific Gravity, Urine 1.005 1.005 - 1.030   pH 7.0 5.0 - 8.0   Glucose, UA NEGATIVE NEGATIVE mg/dL   Hgb urine dipstick SMALL (A) NEGATIVE   Bilirubin Urine NEGATIVE NEGATIVE   Ketones, ur NEGATIVE NEGATIVE mg/dL   Protein, ur 100 (A) NEGATIVE mg/dL   Nitrite NEGATIVE NEGATIVE   Leukocytes,Ua NEGATIVE NEGATIVE   RBC / HPF 0-5 0 - 5 RBC/hpf   WBC, UA 0-5 0 - 5 WBC/hpf   Bacteria, UA RARE (A) NONE SEEN   Squamous Epithelial / HPF 0-5 0 - 5 /HPF    Comment: Performed at [facility], [place] [address] [place], [place] [address]  CBC with Differential/Platelet     Status: Abnormal   Collection Time: 01/25/22  5:00 AM  Result Value Ref Range   WBC 5.8 4.0 - 10.5 K/uL   RBC 3.18 (L) 4.22 - 5.81 MIL/uL   Hemoglobin 9.2 (L) 13.0 - 17.0 g/dL   HCT 29.0 (L) 39.0 - 52.0 %   MCV 91.2 80.0 - 100.0 fL   MCH 28.9 26.0 - 34.0 pg   MCHC 31.7 30.0 - 36.0 g/dL   RDW 16.5 (H) 11.5 - 15.5 %   Platelets 333 150 - 400 K/uL   nRBC 0.0 0.0 - 0.2 %   Neutrophils Relative % 72 %   Neutro Abs 4.1 1.7 - 7.7 K/uL   Lymphocytes Relative 18 %   Lymphs Abs 1.0 0.7 - 4.0 K/uL   Monocytes Relative 6 %   Monocytes Absolute 0.4 0.1 - 1.0 K/uL   Eosinophils Relative 3 %   Eosinophils Absolute 0.2 0.0 - 0.5 K/uL   Basophils Relative 1 %   Basophils Absolute 0.1 0.0 - 0.1 K/uL   Immature Granulocytes 0 %   Abs Immature Granulocytes 0.02 0.00 - 0.07 K/uL    Comment: Performed at [facility], [place] [address] [place], [place] [address]  Comprehensive metabolic panel     Status: Abnormal   Collection Time: 01/25/22  5:00 AM  Result Value Ref Range   Sodium 140  135 - 145 mmol/L   Potassium 4.3 3.5 - 5.1 mmol/L   Chloride 108 98 - 111 mmol/L   CO2 22 22 - 32 mmol/L   Glucose, Bld 113 (H) 70 - 99 mg/dL    Comment: Glucose reference range applies only to samples taken after fasting for at least 8 hours.   BUN 54 (H) 8 - 23 mg/dL   Creatinine, Ser 4.63 (H) 0.61 - 1.24 mg/dL   Calcium 8.9 8.9 - 10.3 mg/dL   Total Protein 7.3 6.5 - 8.1 g/dL   Albumin 3.4 (L) 3.5 - 5.0 g/dL   AST 14 (L) 15 - 41 U/L   ALT 11 0 - 44 U/L   Alkaline Phosphatase 96 38 - 126 U/L   Total Bilirubin 1.0 0.3 - 1.2 mg/dL   GFR, Estimated 14 (L) >60 mL/min    Comment: (NOTE) Calculated using the CKD-EPI Creatinine Equation (2021)    Anion gap 10 5 - 15    Comment: Performed at [facility], [place] [address] [place], [place] [address]  Magnesium     Status: Abnormal   Collection Time: 01/25/22  5:00 AM  Result Value Ref Range   Magnesium 1.6 (L) 1.7 - 2.4 mg/dL    Comment: Performed at [facility], [place] [address], [place], [place] [address]  Phosphorus     Status: Abnormal   Collection  Time: 01/25/22  5:00 AM  Result Value Ref Range   Phosphorus 4.7 (H) 2.5 - 4.6 mg/dL    Comment: Performed at [facility], [place] [address] [place], [place] [address]  TSH     Status: None   Collection Time: 01/25/22  5:00 AM  Result Value Ref Range   TSH 3.010 0.350 - 4.500 uIU/mL    Comment: Performed by a 3rd Generation assay with a functional sensitivity of <=0.01 uIU/mL. Performed at [facility], [place] [address], [place], [place] [address]   CBG monitoring, ED     Status: Abnormal   Collection Time: 01/25/22  7:22 AM  Result Value Ref Range   Glucose-Capillary 117 (H) 70 - 99 mg/dL    Comment: Glucose reference range applies only to samples taken after fasting for at least 8 hours.  CBG monitoring, ED     Status: Abnormal   Collection Time: 01/25/22 12:30 PM  Result Value Ref Range   Glucose-Capillary  130 (H) 70 - 99 mg/dL    Comment: Glucose reference range applies only to samples taken after fasting for at least 8 hours.   CT CHEST WO CONTRAST  Result Date: 01/24/2022 CLINICAL DATA:  Respiratory illness. Recent rapid weight gain with shortness of breath. EXAM: CT CHEST WITHOUT CONTRAST TECHNIQUE: Multidetector CT imaging of the chest was performed following the standard protocol without IV contrast. RADIATION DOSE REDUCTION: This exam was performed according to the departmental dose-optimization program which includes automated exposure control, adjustment of the mA and/or kV according to patient size and/or use of iterative reconstruction technique. COMPARISON:  Radiographs 01/24/2022 and 12/25/2021. Abdominal CT 10/31/2021. No prior chest CT. FINDINGS: Cardiovascular: Mild aortic and coronary artery atherosclerosis. No acute vascular findings on noncontrast imaging. There is a small amount of pericardial fluid versus pericardial thickening. The heart size is normal. Mediastinum/Nodes: Numerous small mediastinal lymph nodes do not appear pathologically enlarged. Hilar assessment is limited by the lack of intravenous contrast.No axillary adenopathy. The thyroid gland, trachea and esophagus demonstrate no significant findings. Lungs/Pleura: Small bilateral pleural effusions. Patchy non focal ground-glass opacities in both lungs which may reflect edema. No confluent airspace opacity or suspicious pulmonary nodule. Upper abdomen: Small retroperitoneal lymph nodes do not appear pathologically enlarged. No ascites. Musculoskeletal/Chest wall: New generalized soft tissue edema suspicious for anasarca. Moderate bilateral gynecomastia. No chest wall mass or suspicious osseous abnormality. IMPRESSION: 1. New generalized soft tissue edema suspicious for anasarca. Small bilateral pleural effusions with patchy non focal ground-glass opacities in both lungs which may reflect edema. Findings suggest congestive heart  failure. Viral infection considered less likely. Correlate clinically. 2. No focal airspace opacity or suspicious pulmonary nodule. 3.  Aortic Atherosclerosis (ICD10-I70.0). Electronically Signed   By: Richardean Sale M.D.   On: 01/24/2022 18:38   DG Chest 2 View  Result Date: 01/24/2022 CLINICAL DATA:  Chest pain and shortness of breath EXAM: CHEST - 2 VIEW COMPARISON:  Chest x-ray 12/25/2021 FINDINGS: The heart size and mediastinal contours are within normal limits. Both lungs are clear. The visualized skeletal structures are unremarkable. IMPRESSION: No active cardiopulmonary disease. Electronically Signed   By: Ronney Asters M.D.   On: 01/24/2022 15:06    Pending Labs Unresulted Labs (From admission, onward)     Start     Ordered   01/24/22 2336  Magnesium  Add-on,   AD        01/24/22 2335  Vitals/Pain Today's Vitals   01/25/22 1330 01/25/22 1400 01/25/22 1500 01/25/22 1600  BP: (!) 140/95 138/82 (!) 146/88 (!) 142/86  Pulse: 83 81 81 76  Resp: '14 18 18 15  '$ Temp:      TempSrc:      SpO2: 95% 94% 96% 98%    Isolation Precautions No active isolations  Medications Medications  acetaminophen (TYLENOL) tablet 650 mg (has no administration in time range)    Or  acetaminophen (TYLENOL) suppository 650 mg (has no administration in time range)  melatonin tablet 3 mg (has no administration in time range)  furosemide (LASIX) injection 80 mg (80 mg Intravenous Given 01/25/22 0729)  amLODipine (NORVASC) tablet 10 mg (10 mg Oral Given 01/25/22 0913)  clopidogrel (PLAVIX) tablet 75 mg (75 mg Oral Given 01/25/22 0914)  finasteride (PROSCAR) tablet 5 mg (5 mg Oral Not Given 01/25/22 1239)  hydrALAZINE (APRESOLINE) tablet 100 mg (100 mg Oral Given 01/25/22 0919)  metoprolol succinate (TOPROL-XL) 24 hr tablet 50 mg (50 mg Oral Given 01/25/22 0913)  rosuvastatin (CRESTOR) tablet 10 mg (10 mg Oral Given 01/25/22 0913)  sodium bicarbonate tablet 650 mg (650 mg Oral Given 01/25/22 0914)   tamsulosin (FLOMAX) capsule 0.4 mg (0.4 mg Oral Given 01/25/22 0913)  insulin glargine-yfgn (SEMGLEE) injection 20 Units (has no administration in time range)  insulin aspart (novoLOG) injection 0-9 Units (1 Units Subcutaneous Given 01/25/22 1240)  furosemide (LASIX) injection 80 mg (80 mg Intravenous Given 01/24/22 2226)    Mobility walks Low fall risk   Focused Assessments Pulmonary Assessment Handoff:  Lung sounds:   O2 Device: Room Air      R Recommendations: See Admitting Provider Note  Report given to:   Additional Notes:

## 2022-01-26 ENCOUNTER — Other Ambulatory Visit: Payer: Self-pay | Admitting: Internal Medicine

## 2022-01-26 DIAGNOSIS — I5033 Acute on chronic diastolic (congestive) heart failure: Secondary | ICD-10-CM | POA: Diagnosis not present

## 2022-01-26 LAB — GLUCOSE, CAPILLARY
Glucose-Capillary: 93 mg/dL (ref 70–99)
Glucose-Capillary: 123 mg/dL — ABNORMAL HIGH (ref 70–99)

## 2022-01-26 MED ORDER — FUROSEMIDE 80 MG PO TABS: 80.0000 mg | ORAL_TABLET | Freq: Two times a day (BID) | ORAL | 0 refills | Status: DC

## 2022-01-26 MED ORDER — HYDRALAZINE HCL 100 MG PO TABS: 100.0000 mg | ORAL_TABLET | Freq: Two times a day (BID) | ORAL | 0 refills | Status: DC

## 2022-01-26 NOTE — Discharge Summary (Signed)
Physician Discharge Summary  Patient ID: Ryan Jenkins MRN: [id number] DOB/AGE: 04-20-1960 62 y.o.  Admit date: 01/24/2022 Discharge date: 01/26/2022  Admission Diagnoses:  Discharge Diagnoses:  Principal Problem:   Acute on chronic combined systolic and diastolic CHF (congestive heart failure) ([place]) Active Problems:   DM2 (diabetes mellitus, type 2) (HCC)   HLD (hyperlipidemia)   CKD (chronic kidney disease) stage 4, GFR 15-29 ml/min (HCC)   BPH (benign prostatic hyperplasia)   Anemia in chronic kidney disease (CKD)   Generalized weakness   Discharged Condition: stable  Hospital Course: Patient is a 62 year old male with past medical history significant for heart failure with preserved EF, chronic kidney disease stage IV and diabetes mellitus.  Patient was admitted with acute on chronic congestive heart failure and anasarca.  Patient was admitted and managed with IV Lasix.  Anasarca has resolved.  Patient is back to his baseline.  Patient is eager to be discharged back on.  Prior to admission, patient was on Lasix 40 Mg p.o. twice daily.  Lasix will be increased to 80 Mg p.o. twice daily.  Discharge, patient will follow-up with cardiology team, nephrology team and primary care team.  Consults: None  Significant Diagnostic Studies:    Discharge Exam: Blood pressure 109/60, pulse 70, temperature 98.7 F (37.1 C), temperature source Oral, resp. rate 18, height '6\' 2"'$  (1.88 m), weight 94.7 kg, SpO2 100 %.   Disposition: Discharge disposition: 01-Home or Self Care       Discharge Instructions     Diet - low sodium heart healthy   Complete by: As directed    Increase activity slowly   Complete by: As directed       Allergies as of 01/26/2022       Reactions   Claritin [loratadine] Swelling   Joint swelling   Hydrochlorothiazide Other (See Comments)   Dizziness   Latex Hives, Swelling   Lyrica [pregabalin] Other (See Comments)   Depression. "Makes me loopy"     Metformin And Related Diarrhea, Nausea And Vomiting        Medication List     STOP taking these medications    escitalopram 20 MG tablet Commonly known as: LEXAPRO   nortriptyline 25 MG capsule Commonly known as: PAMELOR   pantoprazole 40 MG tablet Commonly known as: PROTONIX   potassium chloride 10 MEQ tablet Commonly known as: KLOR-CON M   prochlorperazine 5 MG tablet Commonly known as: COMPAZINE       TAKE these medications    acetaminophen 500 MG tablet Commonly known as: TYLENOL Take 1,000 mg by mouth daily as needed for mild pain or headache.   amLODipine 10 MG tablet Commonly known as: NORVASC Take 1 tablet (10 mg total) by mouth daily.   calcitRIOL 0.25 MCG capsule Commonly known as: ROCALTROL Take 0.25 mcg by mouth daily.   clopidogrel 75 MG tablet Commonly known as: PLAVIX Take 1 tablet (75 mg total) by mouth daily.   famotidine 40 MG tablet Commonly known as: PEPCID Take 40 mg by mouth daily.   finasteride 5 MG tablet Commonly known as: PROSCAR Take 1 tablet (5 mg total) by mouth daily.   furosemide 80 MG tablet Commonly known as: Lasix Take 1 tablet (80 mg total) by mouth 2 (two) times daily. What changed:  medication strength how much to take   gabapentin 300 MG capsule Commonly known as: NEURONTIN Take 1 capsule (300 mg total) by mouth daily as needed (neuropathic pain). What changed: when to take  this   hydrALAZINE 100 MG tablet Commonly known as: APRESOLINE Take 1 tablet (100 mg total) by mouth 2 (two) times daily.   Lantus SoloStar 100 UNIT/ML Solostar Pen Generic drug: insulin glargine Inject 22 Units into the skin at bedtime. What changed: how much to take   meclizine 25 MG tablet Commonly known as: ANTIVERT Take 25 mg by mouth as needed for dizziness.   metoprolol succinate 50 MG 24 hr tablet Commonly known as: TOPROL-XL Take 1 tablet (50 mg total) by mouth at bedtime. Take with or immediately following a meal.    rosuvastatin 10 MG tablet Commonly known as: CRESTOR Take 10 mg by mouth daily.   sodium bicarbonate 650 MG tablet Take 650 mg by mouth 2 (two) times daily.   tamsulosin 0.4 MG Caps capsule Commonly known as: FLOMAX Take 0.4 mg by mouth daily.   Vitamin D3 1.25 MG (50000 UT) Caps Take 50,000 Units by mouth once a week.         SignedBonnell Public 01/26/2022, 3:18 PM

## 2022-01-26 NOTE — Evaluation (Addendum)
Physical Therapy Evaluation Patient Details Name: Ryan Jenkins MRN: [id number] DOB: 26-Jun-1960 Today's Date: 01/26/2022  History of Present Illness  Pt is a 62 y/o M admitted from home 2* SOB with acute on chronic diastolic heart failure.  Pt reports multiple recent hospital stays for same as well as PNA. Pt also with urinary retention & foley was placed. PMH: CKD stage IV, diastolic heart failure  Clinical Impression  Pt admitted as above and presenting with functional mobility limitations 2* generalized weakness, limited endurance and balance deficits.  Pt very motivated and should progress to dc home with family assist.  Pt states he was receiving HHPT prior to admit to address strength/balance deficits and would like to continue with same.  Of note, pt maintained O2 sats on RA above 94% throughout session but HR noted to fall into 30s with activity - RN aware.     Recommendations for follow up therapy are one component of a multi-disciplinary discharge planning process, led by the attending physician.  Recommendations may be updated based on patient status, additional functional criteria and insurance authorization.  Follow Up Recommendations Home health PT      Assistance Recommended at Discharge Intermittent Supervision/Assistance  Patient can return home with the following  A little help with walking and/or transfers;Assist for transportation;Help with stairs or ramp for entrance;Assistance with cooking/housework    Equipment Recommendations None recommended by PT  Recommendations for Other Services       Functional Status Assessment Patient has had a recent decline in their functional status and demonstrates the ability to make significant improvements in function in a reasonable and predictable amount of time.     Precautions / Restrictions Precautions Precautions: Fall Restrictions Weight Bearing Restrictions: No      Mobility  Bed Mobility Overal bed mobility:  Modified Independent             General bed mobility comments: INcreased time but no physical assist    Transfers Overall transfer level: Needs assistance Equipment used: Rolling walker (2 wheels) Transfers: Sit to/from Stand Sit to Stand: Min guard           General transfer comment: Steady assist only    Ambulation/Gait Ambulation/Gait assistance: Min assist, Min guard Gait Distance (Feet): 250 Feet Assistive device: Rolling walker (2 wheels) Gait Pattern/deviations: Step-through pattern, Decreased step length - right, Decreased step length - left, Shuffle, Trunk flexed Gait velocity: decr     General Gait Details: Steady assist for balance with RW; min cues for posture and position from RW; multiple standing rest breaks 2* SOB  Stairs            Wheelchair Mobility    Modified Rankin (Stroke Patients Only)       Balance Overall balance assessment: Needs assistance Sitting-balance support: Feet supported, No upper extremity supported Sitting balance-Leahy Scale: Good     Standing balance support: Bilateral upper extremity supported Standing balance-Leahy Scale: Poor                               Pertinent Vitals/Pain Pain Assessment Pain Assessment: No/denies pain    Home Living Family/patient expects to be discharged to:: Private residence Living Arrangements: Other (Comment) Available Help at Discharge: Family;Available PRN/intermittently Type of Home: Apartment Home Access: Level entry       Home Layout: One level Home Equipment: Conservation officer, nature (2 wheels);Rollator (4 wheels);BSC/3in1;Tub bench;Wheelchair - manual Additional Comments: Mod indep with  household mobility. Uses WC inside the apartment and rollator in the community.    Prior Function Prior Level of Function : Independent/Modified Independent             Mobility Comments: Pt reports mostly using wc at home since last admit but HHPT has been working to  progress back to rollator/RW ADLs Comments: Pt reports MOD I with ADL but brother does the cooking     Hand Dominance   Dominant Hand: Right    Extremity/Trunk Assessment   Upper Extremity Assessment Upper Extremity Assessment: Generalized weakness    Lower Extremity Assessment Lower Extremity Assessment: Generalized weakness;RLE deficits/detail;LLE deficits/detail RLE Sensation: history of peripheral neuropathy LLE Sensation: history of peripheral neuropathy    Cervical / Trunk Assessment Cervical / Trunk Assessment: Normal  Communication   Communication: No difficulties  Cognition Arousal/Alertness: Awake/alert Behavior During Therapy: WFL for tasks assessed/performed Overall Cognitive Status: Within Functional Limits for tasks assessed                                          General Comments      Exercises     Assessment/Plan    PT Assessment Patient needs continued PT services  PT Problem List Decreased strength;Decreased activity tolerance;Decreased balance;Decreased mobility;Decreased knowledge of use of DME       PT Treatment Interventions DME instruction;Gait training;Stair training;Functional mobility training;Therapeutic activities;Therapeutic exercise;Balance training;Patient/family education    PT Goals (Current goals can be found in the Care Plan section)  Acute Rehab PT Goals Patient Stated Goal: Regain IND PT Goal Formulation: With patient Time For Goal Achievement: 02/09/22 Potential to Achieve Goals: Good    Frequency Min 3X/week     Co-evaluation               AM-PAC PT "6 Clicks" Mobility  Outcome Measure Help needed turning from your back to your side while in a flat bed without using bedrails?: None Help needed moving from lying on your back to sitting on the side of a flat bed without using bedrails?: None Help needed moving to and from a bed to a chair (including a wheelchair)?: A Little Help needed standing  up from a chair using your arms (e.g., wheelchair or bedside chair)?: A Little Help needed to walk in hospital room?: A Little Help needed climbing 3-5 steps with a railing? : A Lot 6 Click Score: 19    End of Session Equipment Utilized During Treatment: Gait belt Activity Tolerance: Patient tolerated treatment well Patient left: in chair;with call bell/phone within reach Nurse Communication: Mobility status PT Visit Diagnosis: Muscle weakness (generalized) (M62.81);Difficulty in walking, not elsewhere classified (R26.2);Unsteadiness on feet (R26.81)    Time: 4132-4401 PT Time Calculation (min) (ACUTE ONLY): 30 min   Charges:   PT Evaluation $PT Eval Low Complexity: 1 Low PT Treatments $Gait Training: 8-22 mins        Debe Coder PT Acute Rehabilitation Services Pager [phone] Office [phone]   Demontrez Rindfleisch 01/26/2022, 10:45 AM

## 2022-01-26 NOTE — TOC Initial Note (Signed)
Transition of Care [facility]) - Initial/Assessment Note    Patient Details  Name: Ryan Jenkins MRN: [id number] Date of Birth: 22-Apr-1960  Transition of Care [facility]) CM/SW Contact:    Illene Regulus, LCSW Phone Number: 01/26/2022, 4:30 PM  Clinical Narrative:                 [facility] is following this pt for HHPT will need new orders upon d/c. TOC to follow for d/c needs.     Patient Goals and CMS Choice            Expected Discharge Plan and Services         Expected Discharge Date: 01/26/22                                    Prior Living Arrangements/Services                       Activities of Daily Living Home Assistive Devices/Equipment: Gilford Rile (specify type), Wheelchair ADL Screening (condition at time of admission) Patient's cognitive ability adequate to safely complete daily activities?: Yes Is the patient deaf or have difficulty hearing?: No Does the patient have difficulty seeing, even when wearing glasses/contacts?: No Does the patient have difficulty concentrating, remembering, or making decisions?: No Patient able to express need for assistance with ADLs?: Yes Does the patient have difficulty dressing or bathing?: No Independently performs ADLs?: Yes (appropriate for developmental age) Does the patient have difficulty walking or climbing stairs?: Yes Weakness of Legs: Both Weakness of Arms/Hands: None  Permission Sought/Granted                  Emotional Assessment              Admission diagnosis:  Acute on chronic systolic heart failure ([place]) [I50.23] Primary hypertension [I10] Acute pulmonary edema ([place]) [J81.0] Acute on chronic combined systolic and diastolic congestive heart failure ([place]) [I50.43] Patient Active Problem List   Diagnosis Date Noted   Generalized weakness 01/25/2022   Acute on chronic systolic heart failure ([place]) 01/24/2022   Multifocal pneumonia 12/22/2021   CHF exacerbation ([place])  10/19/2021   Hypokalemia 10/18/2021   Anemia in chronic kidney disease (CKD) 10/18/2021   BPH (benign prostatic hyperplasia) 10/16/2021   Severe sepsis ([place]) 06/05/2021   Coronary artery disease 02/07/2021   Acute renal failure superimposed on stage 4 chronic kidney disease ([place])    CKD (chronic kidney disease) stage 4, GFR 15-29 ml/min ([place]) 07/07/2020   Acute on chronic diastolic CHF (congestive heart failure) ([place]) 07/07/2020   Erectile dysfunction associated with type 2 diabetes mellitus ([place]) 08/07/2019   Vitreous hemorrhage of left eye ([place]) 07/22/2019   Vision loss of left eye 07/22/2019   History of medication noncompliance 04/02/2019   Gastric ulcer without hemorrhage or perforation 12/25/2018   Gastroesophageal reflux disease without esophagitis 09/04/2018   Diabetic retinopathy of both eyes associated with type 2 diabetes mellitus ([place]) 01/03/2018   Intermittent diarrhea 09/27/2017   Gastroparesis 09/27/2017   Macroalbuminuric diabetic nephropathy ([place]) 06/25/2017   History of falling 06/25/2017   HLD (hyperlipidemia) 03/21/2017   Vitamin B 12 deficiency 03/21/2017   DM2 (diabetes mellitus, type 2) ([place]) 02/05/2017   Essential hypertension 02/05/2017   Depression 02/05/2017   Unintended weight loss 02/05/2017   Gait disturbance 02/05/2017   Pronation deformity of both feet 05/11/2014   Diabetic neuropathy, type II diabetes  mellitus ([place]) 05/11/2014   Metatarsal deformity 05/11/2014   PCP:  [name], MD Pharmacy:   [facility] [place] [place] [address] Phone: [phone] Fax: [phone]  [id number] [place] [id number]: [phone]: [phone]    Social Determinants of Health (SDOH) Social History: SGlen White No Food Insecurity (01/25/2022)  Housing: Low Risk  (01/25/2022)  Transportation Needs: No Transportation Needs (01/25/2022)  Utilities: Not At Risk (01/25/2022)  Depression (PHQ2-9): Medium Risk (08/07/2019)  Tobacco Use: Low Risk  (01/25/2022)   SDOH Interventions:     Readmission Risk Interventions    11/29/2021    4:16 PM  Readmission Risk Prevention Plan  Transportation Screening Complete  Medication Review (Press photographer Complete  HRI or [place]uffalo [place]  SW Recovery Care/Counseling Consult Complete  Palliative Care Screening Not ABethel SpringsNot Applicable

## 2022-03-12 ENCOUNTER — Other Ambulatory Visit: Payer: Self-pay | Admitting: *Deleted

## 2022-03-12 DIAGNOSIS — N184 Chronic kidney disease, stage 4 (severe): Secondary | ICD-10-CM

## 2022-03-21 ENCOUNTER — Encounter (HOSPITAL_BASED_OUTPATIENT_CLINIC_OR_DEPARTMENT_OTHER): Payer: Self-pay | Admitting: Emergency Medicine

## 2022-03-21 ENCOUNTER — Other Ambulatory Visit: Payer: Self-pay

## 2022-03-21 ENCOUNTER — Inpatient Hospital Stay (HOSPITAL_BASED_OUTPATIENT_CLINIC_OR_DEPARTMENT_OTHER)
Admission: EM | Admit: 2022-03-21 | Discharge: 2022-03-29 | DRG: 291 | Disposition: A | Discharge status: Home Health | Payer: Medicaid Other | Attending: Internal Medicine | Admitting: Internal Medicine

## 2022-03-21 ENCOUNTER — Emergency Department (HOSPITAL_BASED_OUTPATIENT_CLINIC_OR_DEPARTMENT_OTHER): Payer: Medicaid Other

## 2022-03-21 DIAGNOSIS — N3001 Acute cystitis with hematuria: Secondary | ICD-10-CM | POA: Diagnosis present

## 2022-03-21 DIAGNOSIS — Z823 Family history of stroke: Secondary | ICD-10-CM

## 2022-03-21 DIAGNOSIS — I130 Hypertensive heart and chronic kidney disease with heart failure and stage 1 through stage 4 chronic kidney disease, or unspecified chronic kidney disease: Principal | ICD-10-CM | POA: Diagnosis present

## 2022-03-21 DIAGNOSIS — I509 Heart failure, unspecified: Secondary | ICD-10-CM

## 2022-03-21 DIAGNOSIS — I5043 Acute on chronic combined systolic (congestive) and diastolic (congestive) heart failure: Secondary | ICD-10-CM | POA: Diagnosis present

## 2022-03-21 DIAGNOSIS — Z1612 Extended spectrum beta lactamase (ESBL) resistance: Secondary | ICD-10-CM | POA: Diagnosis present

## 2022-03-21 DIAGNOSIS — R110 Nausea: Secondary | ICD-10-CM | POA: Diagnosis not present

## 2022-03-21 DIAGNOSIS — B961 Klebsiella pneumoniae [K. pneumoniae] as the cause of diseases classified elsewhere: Secondary | ICD-10-CM | POA: Diagnosis present

## 2022-03-21 DIAGNOSIS — I2510 Atherosclerotic heart disease of native coronary artery without angina pectoris: Secondary | ICD-10-CM | POA: Diagnosis present

## 2022-03-21 DIAGNOSIS — Z8249 Family history of ischemic heart disease and other diseases of the circulatory system: Secondary | ICD-10-CM

## 2022-03-21 DIAGNOSIS — Z6827 Body mass index (BMI) 27.0-27.9, adult: Secondary | ICD-10-CM

## 2022-03-21 DIAGNOSIS — N184 Chronic kidney disease, stage 4 (severe): Secondary | ICD-10-CM | POA: Diagnosis present

## 2022-03-21 DIAGNOSIS — J9601 Acute respiratory failure with hypoxia: Secondary | ICD-10-CM | POA: Diagnosis present

## 2022-03-21 DIAGNOSIS — Z961 Presence of intraocular lens: Secondary | ICD-10-CM | POA: Diagnosis present

## 2022-03-21 DIAGNOSIS — Z8481 Family history of carrier of genetic disease: Secondary | ICD-10-CM

## 2022-03-21 DIAGNOSIS — N3000 Acute cystitis without hematuria: Secondary | ICD-10-CM

## 2022-03-21 DIAGNOSIS — Z8619 Personal history of other infectious and parasitic diseases: Secondary | ICD-10-CM

## 2022-03-21 DIAGNOSIS — M19011 Primary osteoarthritis, right shoulder: Secondary | ICD-10-CM | POA: Diagnosis present

## 2022-03-21 DIAGNOSIS — Z794 Long term (current) use of insulin: Secondary | ICD-10-CM

## 2022-03-21 DIAGNOSIS — N179 Acute kidney failure, unspecified: Secondary | ICD-10-CM | POA: Diagnosis present

## 2022-03-21 DIAGNOSIS — J9621 Acute and chronic respiratory failure with hypoxia: Secondary | ICD-10-CM | POA: Diagnosis present

## 2022-03-21 DIAGNOSIS — D631 Anemia in chronic kidney disease: Secondary | ICD-10-CM | POA: Diagnosis present

## 2022-03-21 DIAGNOSIS — Z9079 Acquired absence of other genital organ(s): Secondary | ICD-10-CM

## 2022-03-21 DIAGNOSIS — N401 Enlarged prostate with lower urinary tract symptoms: Secondary | ICD-10-CM | POA: Diagnosis present

## 2022-03-21 DIAGNOSIS — M545 Low back pain, unspecified: Secondary | ICD-10-CM | POA: Diagnosis present

## 2022-03-21 DIAGNOSIS — Z9842 Cataract extraction status, left eye: Secondary | ICD-10-CM

## 2022-03-21 DIAGNOSIS — Z7902 Long term (current) use of antithrombotics/antiplatelets: Secondary | ICD-10-CM

## 2022-03-21 DIAGNOSIS — Z888 Allergy status to other drugs, medicaments and biological substances status: Secondary | ICD-10-CM

## 2022-03-21 DIAGNOSIS — R627 Adult failure to thrive: Secondary | ICD-10-CM | POA: Diagnosis present

## 2022-03-21 DIAGNOSIS — G8929 Other chronic pain: Secondary | ICD-10-CM | POA: Diagnosis present

## 2022-03-21 DIAGNOSIS — E8722 Chronic metabolic acidosis: Secondary | ICD-10-CM | POA: Diagnosis present

## 2022-03-21 DIAGNOSIS — R9431 Abnormal electrocardiogram [ECG] [EKG]: Secondary | ICD-10-CM | POA: Diagnosis present

## 2022-03-21 DIAGNOSIS — E1122 Type 2 diabetes mellitus with diabetic chronic kidney disease: Secondary | ICD-10-CM | POA: Diagnosis present

## 2022-03-21 DIAGNOSIS — Z8051 Family history of malignant neoplasm of kidney: Secondary | ICD-10-CM

## 2022-03-21 DIAGNOSIS — Z79899 Other long term (current) drug therapy: Secondary | ICD-10-CM

## 2022-03-21 DIAGNOSIS — I2489 Other forms of acute ischemic heart disease: Secondary | ICD-10-CM | POA: Diagnosis present

## 2022-03-21 DIAGNOSIS — K5900 Constipation, unspecified: Secondary | ICD-10-CM | POA: Diagnosis not present

## 2022-03-21 DIAGNOSIS — Z91040 Latex allergy status: Secondary | ICD-10-CM

## 2022-03-21 DIAGNOSIS — E1165 Type 2 diabetes mellitus with hyperglycemia: Secondary | ICD-10-CM | POA: Diagnosis present

## 2022-03-21 DIAGNOSIS — E11319 Type 2 diabetes mellitus with unspecified diabetic retinopathy without macular edema: Secondary | ICD-10-CM | POA: Diagnosis present

## 2022-03-21 DIAGNOSIS — M25552 Pain in left hip: Secondary | ICD-10-CM | POA: Diagnosis present

## 2022-03-21 DIAGNOSIS — Z9841 Cataract extraction status, right eye: Secondary | ICD-10-CM

## 2022-03-21 DIAGNOSIS — Z832 Family history of diseases of the blood and blood-forming organs and certain disorders involving the immune mechanism: Secondary | ICD-10-CM

## 2022-03-21 DIAGNOSIS — Z1152 Encounter for screening for COVID-19: Secondary | ICD-10-CM

## 2022-03-21 DIAGNOSIS — Z800 Family history of malignant neoplasm of digestive organs: Secondary | ICD-10-CM

## 2022-03-21 DIAGNOSIS — Z806 Family history of leukemia: Secondary | ICD-10-CM

## 2022-03-21 DIAGNOSIS — E785 Hyperlipidemia, unspecified: Secondary | ICD-10-CM | POA: Diagnosis present

## 2022-03-21 DIAGNOSIS — K219 Gastro-esophageal reflux disease without esophagitis: Secondary | ICD-10-CM | POA: Diagnosis present

## 2022-03-21 DIAGNOSIS — M47817 Spondylosis without myelopathy or radiculopathy, lumbosacral region: Secondary | ICD-10-CM | POA: Diagnosis present

## 2022-03-21 DIAGNOSIS — E1140 Type 2 diabetes mellitus with diabetic neuropathy, unspecified: Secondary | ICD-10-CM | POA: Diagnosis present

## 2022-03-21 LAB — URINALYSIS, ROUTINE W REFLEX MICROSCOPIC
Bilirubin Urine: NEGATIVE
Glucose, UA: NEGATIVE mg/dL
Ketones, ur: NEGATIVE mg/dL
Nitrite: NEGATIVE
Protein, ur: 100 mg/dL — AB
Specific Gravity, Urine: 1.012 (ref 1.005–1.030)
WBC, UA: >50 WBC/hpf (ref 0–5)
pH: 5.5 (ref 5.0–8.0)

## 2022-03-21 LAB — CBC
HCT: 28.6 % — ABNORMAL LOW (ref 39.0–52.0)
Hemoglobin: 9.6 g/dL — ABNORMAL LOW (ref 13.0–17.0)
MCH: 30.1 pg (ref 26.0–34.0)
MCHC: 33.6 g/dL (ref 30.0–36.0)
MCV: 89.7 fL (ref 80.0–100.0)
Platelets: 160 10*3/uL (ref 150–400)
RBC: 3.19 MIL/uL — ABNORMAL LOW (ref 4.22–5.81)
RDW: 15.3 % (ref 11.5–15.5)
WBC: 10.2 10*3/uL (ref 4.0–10.5)
nRBC: 0 % (ref 0.0–0.2)

## 2022-03-21 LAB — BRAIN NATRIURETIC PEPTIDE: B Natriuretic Peptide: 1113.1 pg/mL — ABNORMAL HIGH (ref 0.0–100.0)

## 2022-03-21 LAB — COMPREHENSIVE METABOLIC PANEL
ALT: 7 U/L (ref 0–44)
AST: 14 U/L — ABNORMAL LOW (ref 15–41)
Albumin: 3.8 g/dL (ref 3.5–5.0)
Alkaline Phosphatase: 67 U/L (ref 38–126)
Anion gap: 12 (ref 5–15)
BUN: 64 mg/dL — ABNORMAL HIGH (ref 8–23)
CO2: 17 mmol/L — ABNORMAL LOW (ref 22–32)
Calcium: 8.5 mg/dL — ABNORMAL LOW (ref 8.9–10.3)
Chloride: 109 mmol/L (ref 98–111)
Creatinine, Ser: 6.12 mg/dL — ABNORMAL HIGH (ref 0.61–1.24)
GFR, Estimated: 10 mL/min — ABNORMAL LOW (ref >60)
Glucose, Bld: 169 mg/dL — ABNORMAL HIGH (ref 70–99)
Potassium: 4.6 mmol/L (ref 3.5–5.1)
Sodium: 138 mmol/L (ref 135–145)
Total Bilirubin: 0.6 mg/dL (ref 0.3–1.2)
Total Protein: 7.7 g/dL (ref 6.5–8.1)

## 2022-03-21 LAB — TROPONIN I (HIGH SENSITIVITY)
Troponin I (High Sensitivity): 60 ng/L — ABNORMAL HIGH (ref <18)
Troponin I (High Sensitivity): 59 ng/L — ABNORMAL HIGH (ref <18)

## 2022-03-21 MED ORDER — SODIUM CHLORIDE 0.9 % IV SOLN
2.0000 g | Freq: Once | INTRAVENOUS | Status: AC
  Administered 2022-03-21: 2 g via INTRAVENOUS
  Filled 2022-03-21: qty 20

## 2022-03-21 MED ORDER — FUROSEMIDE 10 MG/ML IJ SOLN
40.0000 mg | Freq: Once | INTRAMUSCULAR | Status: AC
  Administered 2022-03-21: 40 mg via INTRAVENOUS
  Filled 2022-03-21: qty 4

## 2022-03-21 NOTE — ED Notes (Signed)
Pt's sister at bedside- updated on plan of care per pt request

## 2022-03-21 NOTE — Progress Notes (Signed)
Pt's SATs dropped to 86% on RA. RT moved the pulse ox and had him cough. RT placed 2 L [place] and asked the Pt to move up in the bed and to cough . The Pt is now 96%. The Pt is a mouth breather. RT will continue to monitor

## 2022-03-21 NOTE — Progress Notes (Addendum)
Plan of Care Note for accepted transfer   Patient: Ryan Jenkins MRN: [id number]   [place]: 03/21/2022  Facility requesting transfer: [facility]   Requesting Provider: Ronelle Nigh, PA   Reason for transfer: New supplemental O2 requirement   Facility course: 62 yr old man with hx of HTN, T2DM, CKD IV, and HFpEF presents with numerous complaints and was noted to be hypoxic and requiring 2-3 Lpm.   Creatinine is 6.12 (4.63 in January) and CXR with mild cardiomegaly and vascular congestion.   He was given IV Lasix and a dose of Rocephin in ED.   Plan of care: The patient is accepted for admission to Telemetry unit, at [facility].   Author: Vianne Bulls, MD 03/21/2022  Check [email or web address] for on-call coverage.  Nursing staff, Please call [place] number on Amion as soon as patient's arrival, so appropriate admitting provider can evaluate the pt.

## 2022-03-21 NOTE — ED Provider Notes (Signed)
[place] EMERGENCY DEPARTMENT AT [facility] Provider Note   CSN: [id number] Arrival date & time: 03/21/22  1739     History  Chief Complaint  Patient presents with   Cloudy Urine    Ryan Jenkins is a 62 y.o. male.  Patient with history of hypertension, diabetes, CKD, CHF presents today with complaints of foul smelling urine. He states that same has been persistent for the past few weeks. Initially with associated dysuria which spontaneously resolved about a week ago without intervention. He has not seen anyone for this before. He endorses 1 episode of NBNB emesis this morning, none otherwise. No abdominal pain, constipation, or diarrhea. No fevers or chills. He does also note that he currently has a headache, states this is consistent with headaches he has had before. Denies associated fevers, chills, vision changes, neck pain, weakness, or dizziness.   Additionally, during patients visit patient was notably requiring oxygen and therefore after further questioning patient does state that he has been more short of breath recently and having intermittent chest pain without any aggravating or relieving factors.  Denies any leg pain or leg swelling.  He is not on home oxygen.  The history is provided by the patient. No language interpreter was used.       Home Medications Prior to Admission medications   Medication Sig Start Date End Date Taking? Authorizing Provider  acetaminophen (TYLENOL) 500 MG tablet Take 1,000 mg by mouth daily as needed for mild pain or headache.    [provider]  amLODipine (NORVASC) 10 MG tablet Take 1 tablet (10 mg total) by mouth daily. 02/08/21   Danford, Earl Lites, MD  calcitRIOL (ROCALTROL) 0.25 MCG capsule Take 0.25 mcg by mouth daily. 01/01/22   [provider]  Cholecalciferol (VITAMIN D3) 1.25 MG (50000 UT) CAPS Take 50,000 Units by mouth once a week. 09/12/21   [provider]  clopidogrel (PLAVIX) 75 MG  tablet Take 1 tablet (75 mg total) by mouth daily. 07/05/21   de Saintclair Halsted, Cortney E, NP  famotidine (PEPCID) 40 MG tablet Take 40 mg by mouth daily. 08/08/21   [provider]  finasteride (PROSCAR) 5 MG tablet Take 1 tablet (5 mg total) by mouth daily. 07/20/20   Hoy Register, MD  furosemide (LASIX) 80 MG tablet Take 1 tablet (80 mg total) by mouth 2 (two) times daily. 01/26/22 02/25/22  Barnetta Chapel, MD  gabapentin (NEURONTIN) 300 MG capsule Take 1 capsule (300 mg total) by mouth daily as needed (neuropathic pain). Patient taking differently: Take 300 mg by mouth 3 (three) times daily. 11/29/21   Vassie Loll, MD  hydrALAZINE (APRESOLINE) 100 MG tablet Take 1 tablet (100 mg total) by mouth 2 (two) times daily. 01/26/22 02/25/22  Barnetta Chapel, MD  LANTUS SOLOSTAR 100 UNIT/ML Solostar Pen Inject 22 Units into the skin at bedtime. Patient taking differently: Inject 30 Units into the skin at bedtime. 11/29/21   Vassie Loll, MD  meclizine (ANTIVERT) 25 MG tablet Take 25 mg by mouth as needed for dizziness. 08/08/21   [provider]  metoprolol succinate (TOPROL-XL) 50 MG 24 hr tablet Take 1 tablet (50 mg total) by mouth at bedtime. Take with or immediately following a meal. Patient not taking: Reported on 01/25/2022 02/08/21   Alberteen Sam, MD  rosuvastatin (CRESTOR) 10 MG tablet Take 10 mg by mouth daily.    [provider]  sodium bicarbonate 650 MG tablet Take 650 mg by mouth 2 (  two) times daily. 09/20/21   [provider]  tamsulosin (FLOMAX) 0.4 MG CAPS capsule Take 0.4 mg by mouth daily. 05/25/21   [provider]      Allergies    Claritin [loratadine], Hydrochlorothiazide, Latex, Lyrica [pregabalin], and Metformin and related    Review of Systems   Review of Systems  Respiratory:  Positive for shortness of breath.   Cardiovascular:  Positive for chest pain.  Gastrointestinal:  Positive for nausea and vomiting.   Genitourinary:  Positive for dysuria.  All other systems reviewed and are negative.   Physical Exam Updated Vital Signs BP (!) 162/102 (BP Location: Right Arm)   Pulse 100   Temp 98.5 F (36.9 C) (Oral)   Resp (!) 22   SpO2 94%  Physical Exam Vitals and nursing note reviewed.  Constitutional:      General: He is not in acute distress.    Appearance: Normal appearance. He is normal weight. He is not ill-appearing, toxic-appearing or diaphoretic.  HENT:     Head: Normocephalic and atraumatic.  Eyes:     Extraocular Movements: Extraocular movements intact.     Pupils: Pupils are equal, round, and reactive to light.  Neck:     Comments: No meningismus  Cardiovascular:     Rate and Rhythm: Normal rate and regular rhythm.     Heart sounds: Normal heart sounds.  Pulmonary:     Effort: Pulmonary effort is normal. No respiratory distress.  Abdominal:     General: Abdomen is flat.     Palpations: Abdomen is soft.     Tenderness: There is no abdominal tenderness.  Musculoskeletal:        General: Normal range of motion.     Cervical back: Normal range of motion.     Right lower leg: No edema.     Left lower leg: No edema.  Skin:    General: Skin is warm and dry.  Neurological:     General: No focal deficit present.     Mental Status: He is alert and oriented to person, place, and time.     Gait: Gait normal.  Psychiatric:        Mood and Affect: Mood normal.        Behavior: Behavior normal.     ED Results / Procedures / Treatments   Labs (all labs ordered are listed, but only abnormal results are displayed) Labs Reviewed  URINALYSIS, ROUTINE W REFLEX MICROSCOPIC - Abnormal; Notable for the following components:      Result Value   APPearance HAZY (*)    Hgb urine dipstick TRACE (*)    Protein, ur 100 (*)    Leukocytes,Ua LARGE (*)    Bacteria, UA MANY (*)    All other components within normal limits  CBC - Abnormal; Notable for the following components:   RBC  3.19 (*)    Hemoglobin 9.6 (*)    HCT 28.6 (*)    All other components within normal limits  COMPREHENSIVE METABOLIC PANEL - Abnormal; Notable for the following components:   CO2 17 (*)    Glucose, Bld 169 (*)    BUN 64 (*)    Creatinine, Ser 6.12 (*)    Calcium 8.5 (*)    AST 14 (*)    GFR, Estimated 10 (*)    All other components within normal limits  BRAIN NATRIURETIC PEPTIDE - Abnormal; Notable for the following components:   B Natriuretic Peptide 1,113.1 (*)    All  other components within normal limits  TROPONIN I (HIGH SENSITIVITY) - Abnormal; Notable for the following components:   Troponin I (High Sensitivity) 60 (*)    All other components within normal limits  TROPONIN I (HIGH SENSITIVITY) - Abnormal; Notable for the following components:   Troponin I (High Sensitivity) 59 (*)    All other components within normal limits    EKG EKG Interpretation  Date/Time:  Wednesday March 21 2022 19:49:32 EST Ventricular Rate:  100 PR Interval:  182 QRS Duration: 108 QT Interval:  381 QTC Calculation: 492 R Axis:   -26 Text Interpretation: Sinus tachycardia LAE, consider biatrial enlargement Borderline left axis deviation Borderline repolarization abnormality Borderline prolonged QT interval Confirmed by Vanetta Mulders [phone]) on 03/21/2022 7:54:13 PM  Radiology DG Chest Portable 1 View  Result Date: 03/21/2022 CLINICAL DATA:  Shortness of breath. EXAM: PORTABLE CHEST 1 VIEW COMPARISON:  Chest radiograph dated 01/24/2022 and CT dated 01/24/2022. FINDINGS: Mild cardiomegaly with mild vascular congestion. No focal consolidation, pleural effusion, pneumothorax. No acute osseous pathology. IMPRESSION: Mild cardiomegaly with mild vascular congestion. Electronically Signed   By: Elgie Collard M.D.   On: 03/21/2022 20:17    Procedures .Critical Care  Performed by: Silva Bandy, PA-C Authorized by: Silva Bandy, PA-C   Critical care provider statement:    Critical care time  (minutes):  30   Critical care start time:  03/21/2022 8:30 PM   Critical care end time:  03/21/2022 9:00 PM   Critical care was necessary to treat or prevent imminent or life-threatening deterioration of the following conditions:  Respiratory failure, circulatory failure, cardiac failure and renal failure   Critical care was time spent personally by me on the following activities:  Development of treatment plan with patient or surrogate, discussions with primary provider, evaluation of patient's response to treatment, examination of patient, obtaining history from patient or surrogate, ordering and review of laboratory studies, ordering and review of radiographic studies, pulse oximetry, re-evaluation of patient's condition and review of old charts   Care discussed with: admitting provider       Medications Ordered in ED Medications - No data to display  ED Course/ Medical Decision Making/ A&P                             Medical Decision Making Amount and/or Complexity of Data Reviewed Labs: ordered.   This patient is a 62 y.o. male who presents to the ED for concern of foul smelling urine, shortness of breath, this involves an extensive number of treatment options, and is a complaint that carries with it a high risk of complications and morbidity. The emergent differential diagnosis prior to evaluation includes, but is not limited to,  UTI, pyelonephritis, CHF, pericardial effusion/tamponade, arrhythmias, ACS, COPD, asthma, bronchitis, pneumonia, pneumothorax, PE, anemia  This is not an exhaustive differential.   Past Medical History / Co-morbidities / Social History: history of hypertension, diabetes, CKD, CHF  Additional history: Chart reviewed. Pertinent results include: patient with several recent admissions for CHF exacerbation. Most recent echo from 12/23/21 shows EF 50-55%  Physical Exam: Physical exam performed. The pertinent findings include: o2 saturation 85% on room air,  improvement with 2L   Lab Tests: I ordered, and personally interpreted labs.  The pertinent results include:  hgb 9.6 improved from previous. Bicarb 17, glucose 169, BUN 64, Creatinine 6.12 up from 54 and 4.63 1 month ago. BNP 1,113.1. UA infectious. Troponin 60 -->  59   Imaging Studies: I ordered imaging studies including CXR. I independently visualized and interpreted imaging which showed   Mild cardiomegaly with mild vascular congestion   I agree with the radiologist interpretation.   Cardiac Monitoring:  The patient was maintained on a cardiac monitor.  My attending physician Dr. Deretha Emory viewed and interpreted the cardiac monitored which showed an underlying rhythm of: no STEMI. I agree with this interpretation.   Medications: I ordered medication including rocephin  for UTI, Lasix for fluid overload/CHF exacerbation. Reevaluation of the patient after these medicines showed that the patient stayed the same. I have reviewed the patients home medicines and have made adjustments as needed.  Disposition:  Patient initially presents with complaints of dysuria.  UA does show signs of UTI.  Throughout his visit, patient was notably hypoxic to 85% and placed on 2 L of oxygen.  Labs and chest x-ray shows signs of CHF exacerbation.  He also has an AKI.  Patient will require admission.  He is understanding and amenable with plan.     Discussed patient with hospitalist Dr. Antionette Char who agrees to admit, recommends in the setting of fluid overload/CHF exacerbation to proceed with giving Lasix despite his AKI.  Findings and plan of care discussed with supervising physician Dr. Deretha Emory who is in agreement.    Final Clinical Impression(s) / ED Diagnoses Final diagnoses:  Acute on chronic respiratory failure with hypoxia (HCC)  Acute on chronic congestive heart failure, unspecified heart failure type (HCC)  AKI (acute kidney injury) (HCC)  Acute cystitis with hematuria    Rx / DC Orders ED  Discharge Orders     None         Vear Clock 03/21/22 2145    Vanetta Mulders, MD 03/22/22 2203

## 2022-03-21 NOTE — ED Triage Notes (Signed)
Pt reports cloudy urine and foul smelling urine. Pt also reporting headache and emesis. Denies fevers.

## 2022-03-22 ENCOUNTER — Inpatient Hospital Stay (HOSPITAL_COMMUNITY): Payer: Medicaid Other

## 2022-03-22 ENCOUNTER — Ambulatory Visit (HOSPITAL_COMMUNITY): Payer: Medicaid Other

## 2022-03-22 ENCOUNTER — Encounter: Payer: Medicaid Other | Admitting: Vascular Surgery

## 2022-03-22 DIAGNOSIS — M25552 Pain in left hip: Secondary | ICD-10-CM | POA: Diagnosis present

## 2022-03-22 DIAGNOSIS — D631 Anemia in chronic kidney disease: Secondary | ICD-10-CM | POA: Diagnosis not present

## 2022-03-22 DIAGNOSIS — N3001 Acute cystitis with hematuria: Secondary | ICD-10-CM | POA: Diagnosis not present

## 2022-03-22 DIAGNOSIS — Z79899 Other long term (current) drug therapy: Secondary | ICD-10-CM | POA: Diagnosis not present

## 2022-03-22 DIAGNOSIS — N184 Chronic kidney disease, stage 4 (severe): Secondary | ICD-10-CM | POA: Diagnosis not present

## 2022-03-22 DIAGNOSIS — E1165 Type 2 diabetes mellitus with hyperglycemia: Secondary | ICD-10-CM | POA: Diagnosis present

## 2022-03-22 DIAGNOSIS — E8722 Chronic metabolic acidosis: Secondary | ICD-10-CM | POA: Diagnosis not present

## 2022-03-22 DIAGNOSIS — J9621 Acute and chronic respiratory failure with hypoxia: Secondary | ICD-10-CM | POA: Diagnosis not present

## 2022-03-22 DIAGNOSIS — N179 Acute kidney failure, unspecified: Secondary | ICD-10-CM | POA: Diagnosis not present

## 2022-03-22 DIAGNOSIS — R609 Edema, unspecified: Secondary | ICD-10-CM | POA: Diagnosis not present

## 2022-03-22 DIAGNOSIS — I2489 Other forms of acute ischemic heart disease: Secondary | ICD-10-CM | POA: Diagnosis not present

## 2022-03-22 DIAGNOSIS — M47817 Spondylosis without myelopathy or radiculopathy, lumbosacral region: Secondary | ICD-10-CM | POA: Diagnosis present

## 2022-03-22 DIAGNOSIS — E11319 Type 2 diabetes mellitus with unspecified diabetic retinopathy without macular edema: Secondary | ICD-10-CM | POA: Diagnosis not present

## 2022-03-22 DIAGNOSIS — Z1612 Extended spectrum beta lactamase (ESBL) resistance: Secondary | ICD-10-CM | POA: Diagnosis present

## 2022-03-22 DIAGNOSIS — J9601 Acute respiratory failure with hypoxia: Secondary | ICD-10-CM | POA: Diagnosis not present

## 2022-03-22 DIAGNOSIS — B961 Klebsiella pneumoniae [K. pneumoniae] as the cause of diseases classified elsewhere: Secondary | ICD-10-CM | POA: Diagnosis present

## 2022-03-22 DIAGNOSIS — I5043 Acute on chronic combined systolic (congestive) and diastolic (congestive) heart failure: Secondary | ICD-10-CM | POA: Diagnosis not present

## 2022-03-22 DIAGNOSIS — I130 Hypertensive heart and chronic kidney disease with heart failure and stage 1 through stage 4 chronic kidney disease, or unspecified chronic kidney disease: Secondary | ICD-10-CM | POA: Diagnosis not present

## 2022-03-22 DIAGNOSIS — Z1152 Encounter for screening for COVID-19: Secondary | ICD-10-CM | POA: Diagnosis not present

## 2022-03-22 DIAGNOSIS — E785 Hyperlipidemia, unspecified: Secondary | ICD-10-CM | POA: Diagnosis present

## 2022-03-22 DIAGNOSIS — E1140 Type 2 diabetes mellitus with diabetic neuropathy, unspecified: Secondary | ICD-10-CM | POA: Diagnosis present

## 2022-03-22 DIAGNOSIS — K5900 Constipation, unspecified: Secondary | ICD-10-CM | POA: Diagnosis not present

## 2022-03-22 DIAGNOSIS — E1122 Type 2 diabetes mellitus with diabetic chronic kidney disease: Secondary | ICD-10-CM | POA: Diagnosis present

## 2022-03-22 DIAGNOSIS — N3000 Acute cystitis without hematuria: Secondary | ICD-10-CM | POA: Diagnosis not present

## 2022-03-22 DIAGNOSIS — Z794 Long term (current) use of insulin: Secondary | ICD-10-CM | POA: Diagnosis not present

## 2022-03-22 LAB — RESPIRATORY PANEL BY PCR

## 2022-03-22 LAB — CBC
HCT: 26.9 % — ABNORMAL LOW (ref 39.0–52.0)
Hemoglobin: 9.4 g/dL — ABNORMAL LOW (ref 13.0–17.0)
MCH: 31 pg (ref 26.0–34.0)
MCHC: 34.9 g/dL (ref 30.0–36.0)
MCV: 88.8 fL (ref 80.0–100.0)
Platelets: 245 10*3/uL (ref 150–400)
RBC: 3.03 MIL/uL — ABNORMAL LOW (ref 4.22–5.81)
RDW: 15 % (ref 11.5–15.5)
WBC: 8.2 10*3/uL (ref 4.0–10.5)
nRBC: 0 % (ref 0.0–0.2)

## 2022-03-22 LAB — RESP PANEL BY RT-PCR (RSV, FLU A&B, COVID)  RVPGX2
Influenza A by PCR: NEGATIVE
Influenza B by PCR: NEGATIVE
Resp Syncytial Virus by PCR: NEGATIVE
SARS Coronavirus 2 by RT PCR: NEGATIVE

## 2022-03-22 LAB — HEMOGLOBIN A1C
Hgb A1c MFr Bld: 7.4 % — ABNORMAL HIGH (ref 4.8–5.6)
Mean Plasma Glucose: 166 mg/dL

## 2022-03-22 LAB — BASIC METABOLIC PANEL
Anion gap: 14 (ref 5–15)
BUN: 63 mg/dL — ABNORMAL HIGH (ref 8–23)
CO2: 16 mmol/L — ABNORMAL LOW (ref 22–32)
Calcium: 8.3 mg/dL — ABNORMAL LOW (ref 8.9–10.3)
Chloride: 108 mmol/L (ref 98–111)
Creatinine, Ser: 5.91 mg/dL — ABNORMAL HIGH (ref 0.61–1.24)
GFR, Estimated: 10 mL/min — ABNORMAL LOW (ref >60)
Glucose, Bld: 160 mg/dL — ABNORMAL HIGH (ref 70–99)
Potassium: 4.2 mmol/L (ref 3.5–5.1)
Sodium: 138 mmol/L (ref 135–145)

## 2022-03-22 LAB — PHOSPHORUS: Phosphorus: 4.4 mg/dL (ref 2.5–4.6)

## 2022-03-22 LAB — GLUCOSE, CAPILLARY
Glucose-Capillary: 159 mg/dL — ABNORMAL HIGH (ref 70–99)
Glucose-Capillary: 137 mg/dL — ABNORMAL HIGH (ref 70–99)
Glucose-Capillary: 89 mg/dL (ref 70–99)
Glucose-Capillary: 114 mg/dL — ABNORMAL HIGH (ref 70–99)

## 2022-03-22 LAB — MAGNESIUM: Magnesium: 1.7 mg/dL (ref 1.7–2.4)

## 2022-03-22 MED ORDER — FAMOTIDINE 20 MG PO TABS
20.0000 mg | ORAL_TABLET | Freq: Every day | ORAL | Status: DC
  Administered 2022-03-23 – 2022-03-29 (×7): 20 mg via ORAL
  Filled 2022-03-22 (×7): qty 1

## 2022-03-22 MED ORDER — SODIUM BICARBONATE 650 MG PO TABS: 650.0000 mg | ORAL_TABLET | Freq: Two times a day (BID) | ORAL | Status: DC

## 2022-03-22 MED ORDER — OXYCODONE HCL 5 MG PO TABS
5.0000 mg | ORAL_TABLET | Freq: Once | ORAL | Status: AC | PRN
  Administered 2022-03-22: 5 mg via ORAL
  Filled 2022-03-22: qty 1

## 2022-03-22 MED ORDER — SODIUM CHLORIDE 0.9 % IV SOLN
500.0000 mg | Freq: Two times a day (BID) | INTRAVENOUS | Status: DC
  Administered 2022-03-22: 500 mg via INTRAVENOUS
  Filled 2022-03-22 (×2): qty 10

## 2022-03-22 MED ORDER — SODIUM BICARBONATE 650 MG PO TABS
1300.0000 mg | ORAL_TABLET | Freq: Two times a day (BID) | ORAL | Status: AC
  Administered 2022-03-22 – 2022-03-24 (×6): 1300 mg via ORAL
  Filled 2022-03-22 (×6): qty 2

## 2022-03-22 MED ORDER — HEPARIN SODIUM (PORCINE) 5000 UNIT/ML IJ SOLN
5000.0000 [IU] | Freq: Three times a day (TID) | INTRAMUSCULAR | Status: DC
  Administered 2022-03-22 – 2022-03-29 (×21): 5000 [IU] via SUBCUTANEOUS
  Filled 2022-03-22 (×21): qty 1

## 2022-03-22 MED ORDER — POLYETHYLENE GLYCOL 3350 17 G PO PACK
17.0000 g | PACK | Freq: Every day | ORAL | Status: DC | PRN
  Administered 2022-03-27: 17 g via ORAL
  Filled 2022-03-22: qty 1

## 2022-03-22 MED ORDER — SODIUM CHLORIDE 0.9 % IV SOLN: 2.0000 g | INTRAVENOUS | Status: DC

## 2022-03-22 MED ORDER — FUROSEMIDE 40 MG PO TABS
80.0000 mg | ORAL_TABLET | Freq: Two times a day (BID) | ORAL | Status: DC
  Administered 2022-03-22 – 2022-03-26 (×9): 80 mg via ORAL
  Filled 2022-03-22 (×10): qty 2

## 2022-03-22 MED ORDER — AMLODIPINE BESYLATE 10 MG PO TABS
10.0000 mg | ORAL_TABLET | Freq: Every day | ORAL | Status: DC
  Administered 2022-03-22 – 2022-03-29 (×8): 10 mg via ORAL
  Filled 2022-03-22 (×8): qty 1

## 2022-03-22 MED ORDER — PROCHLORPERAZINE EDISYLATE 10 MG/2ML IJ SOLN: 5.0000 mg | Freq: Four times a day (QID) | INTRAMUSCULAR | Status: DC | PRN

## 2022-03-22 MED ORDER — FAMOTIDINE 20 MG PO TABS
40.0000 mg | ORAL_TABLET | Freq: Every day | ORAL | Status: DC
  Administered 2022-03-22: 40 mg via ORAL
  Filled 2022-03-22: qty 2

## 2022-03-22 MED ORDER — MELATONIN 5 MG PO TABS
5.0000 mg | ORAL_TABLET | Freq: Every evening | ORAL | Status: DC | PRN
  Administered 2022-03-22 – 2022-03-27 (×4): 5 mg via ORAL
  Filled 2022-03-22 (×4): qty 1

## 2022-03-22 MED ORDER — ROSUVASTATIN CALCIUM 5 MG PO TABS
10.0000 mg | ORAL_TABLET | Freq: Every day | ORAL | Status: DC
  Administered 2022-03-22 – 2022-03-29 (×8): 10 mg via ORAL
  Filled 2022-03-22 (×8): qty 2

## 2022-03-22 MED ORDER — ACETAMINOPHEN 325 MG PO TABS
650.0000 mg | ORAL_TABLET | Freq: Four times a day (QID) | ORAL | Status: DC | PRN
  Administered 2022-03-22 – 2022-03-26 (×3): 650 mg via ORAL
  Filled 2022-03-22 (×3): qty 2

## 2022-03-22 MED ORDER — TAMSULOSIN HCL 0.4 MG PO CAPS
0.4000 mg | ORAL_CAPSULE | Freq: Every day | ORAL | Status: DC
  Administered 2022-03-22 – 2022-03-29 (×8): 0.4 mg via ORAL
  Filled 2022-03-22 (×8): qty 1

## 2022-03-22 MED ORDER — INSULIN ASPART 100 UNIT/ML IJ SOLN
0.0000 [IU] | Freq: Three times a day (TID) | INTRAMUSCULAR | Status: DC
  Administered 2022-03-22 – 2022-03-23 (×4): 2 [IU] via SUBCUTANEOUS
  Administered 2022-03-24 – 2022-03-29 (×4): 1 [IU] via SUBCUTANEOUS

## 2022-03-22 MED ORDER — LIDOCAINE 5 % EX PTCH
1.0000 | MEDICATED_PATCH | CUTANEOUS | Status: DC
  Administered 2022-03-22 – 2022-03-28 (×7): 1 via TRANSDERMAL
  Filled 2022-03-22 (×7): qty 1

## 2022-03-22 MED ORDER — INSULIN ASPART 100 UNIT/ML IJ SOLN: 0.0000 [IU] | Freq: Every day | INTRAMUSCULAR | Status: DC

## 2022-03-22 MED ORDER — ORAL CARE MOUTH RINSE: 15.0000 mL | OROMUCOSAL | Status: DC | PRN

## 2022-03-22 NOTE — Plan of Care (Signed)

## 2022-03-22 NOTE — Progress Notes (Signed)
Pharmacy Antibiotic Note  Ryan Jenkins is a 62 y.o. male admitted on 03/21/2022 with UTI.  Pharmacy has been consulted for Meropenem dosing.  Plan: Meropenem 500 mg IV q12h  Height: '6\' 2"'$  (188 cm) Weight: 97.2 kg (214 lb 4.6 oz) IBW/kg (Calculated) : 82.2  Temp (24hrs), Avg:98.5 F (36.9 C), Min:97.7 F (36.5 C), Max:99.7 F (37.6 C)  Recent Labs  Lab 03/21/22 1745 03/22/22 0316  WBC 10.2 8.2  CREATININE 6.12* 5.91*    Estimated Creatinine Clearance: 15.3 mL/min (A) (by C-G formula based on SCr of 5.91 mg/dL (H)).    Allergies  Allergen Reactions   Claritin [Loratadine] Swelling    Joint swelling    Hydrochlorothiazide Other (See Comments)    Dizziness   Latex Hives and Swelling   Lyrica [Pregabalin] Other (See Comments)    Depression. "Makes me loopy"    Metformin And Related Diarrhea and Nausea And Vomiting     Ryan [place] 03/22/2022 4:45 AM

## 2022-03-22 NOTE — Progress Notes (Signed)
PROGRESS NOTE    Ryan [place]  [id number] DOB: Sep 22, 1960 DOA: 03/21/2022 PCP: [name], MD  Chief Complaint  Patient presents with   Dysuria    Brief Narrative:   Ryan Jenkins is Ryan Jenkins 62 y.o. male with medical history significant for HFpEF, CKD 4, type 2 diabetes, history of ESBL Klebsiella UTI who initially presented to [facility] ED with complaints of cloudy urine, foul-smelling urine for the past few weeks.  Associated with nausea and vomiting x 1 today, intermittent nonproductive cough, and pleuritic pain worse with coughing.   In the ED, noted to be hypoxic with O2 saturation of 88% with concern for mild pulmonary edema seen on chest x-ray.  Hypoxia improved on 3 L nasal cannula to maintain O2 saturation greater than 92%.     Workup revealed UA positive for pyuria.  Was started on empiric IV antibiotics Rocephin.  Workup also revealed AKI on CKD 4 with creatinine of 6.12 from baseline creatinine of 4.6.  BNP elevated greater than 1100 with mild cardiomegaly, mild vascular congestion on chest x-ray, personally reviewed.  He received 1 dose of IV Lasix 40 mg x 1 in the ED.  TRH was asked to admit.  The patient was accepted by Dr. Myna Hidalgo, [facility].  He was transferred to [facility] telemetry cardiac unit as inpatient status.    ED Course: Tmax 99.7.  BP 194/85, pulse 107, respiratory rate 20, O2 saturation 96%-100% on 3 L.  Lab studies remarkable for BNP eleven 1113.1.  Troponin 60, 59.  BUN 64, creatinine 6.12.  Serum bicarb 17.  Hemoglobin 9.4.  Assessment & Plan:   Principal Problem:   Acute respiratory failure with hypoxia (HCC)  Reported Fever Negative covid, RVP, influenza Follow RVP given cough and fever UA concerning for possible UTI, borderline symptoms - didn't report dysuria to me, but on review of EDP note, did report dysuria initially will follow culture and continue abx for now  Acute hypoxic respiratory failure  Cough  Pleuritic Chest Pain Concern  for infectious etiology vs volume in the setting of his renal disease/HF Currently requiring 3 L by [place] Follow RVP as noted above S/p IV lasix x1 dose Will resume home lasix dose and follow  Low threshold for renal consult Strict I/O, daily weights  AKI on CKD 4 Creatinine on presentation 6.12 from baseline creatinine of 4.6 Improving today, will continue to monitor while resuming home lasix dosing Avoid nephrotoxic agents and hypotension. Closely monitor urine output with strict I's and O's.   Pyuria History of ESBL Klebsiella UTI Follow urine culture Continue abx as above   Non anion gap metabolic acidosis Related to CKD   Elevated troponin Elevated BNP Suspect related to CKD Had Brianny Soulliere recent echocardiogram from 12/23/2021, showing LVEF 55 to 60%, with grade 2 diastolic dysfunction. Monitor strict I's and O's and daily weight   Type 2 diabetes with hyperglycemia A1c 8.4 09/2021 SSI Follow pending A1c   Anemia of chronic disease in the setting of advanced CKD. Hemoglobin at baseline 9.4. trend    DVT prophylaxis: heparin Code Status: full Family Communication: none Disposition:   Status is: Inpatient Remains inpatient appropriate because: pending further improvement   Consultants:  none  Procedures:  none  Antimicrobials:  Anti-infectives (From admission, onward)    Start     Dose/Rate Route Frequency Ordered Stop   03/22/22 1800  cefTRIAXone (ROCEPHIN) 2 g in sodium chloride 0.9 % 100 mL IVPB  Status:  Discontinued  2 g 200 mL/hr over 30 Minutes Intravenous Every 24 hours 03/22/22 0300 03/22/22 0441   03/22/22 0600  meropenem (MERREM) 500 mg in sodium chloride 0.9 % 100 mL IVPB        500 mg 200 mL/hr over 30 Minutes Intravenous Every 12 hours 03/22/22 0447     03/21/22 1945  cefTRIAXone (ROCEPHIN) 2 g in sodium chloride 0.9 % 100 mL IVPB        2 g 200 mL/hr over 30 Minutes Intravenous  Once 03/21/22 1936 03/21/22 2059       Subjective: No  complaints  Objective: Vitals:   03/22/22 0214 03/22/22 0351 03/22/22 0733 03/22/22 1127  BP: (!) 184/85 (!) 158/82 (!) 164/81 (!) 149/111  Pulse: (!) 107 99 100 88  Resp: '20 20 18 16  '$ Temp: 99.7 F (37.6 C) 97.7 F (36.5 C) 99.1 F (37.3 C) 98.4 F (36.9 C)  TempSrc: Oral Oral Oral Oral  SpO2: 96% 98% 96% 100%  Weight:  97.2 kg    Height:  '6\' 2"'$  (1.88 m)      Intake/Output Summary (Last 24 hours) at 03/22/2022 1521 Last data filed at 03/22/2022 0300 Gross per 24 hour  Intake --  Output 320 ml  Net -320 ml   Filed Weights   03/22/22 0351  Weight: 97.2 kg    Examination:  General exam: Appears calm and comfortable  Respiratory system: unlabored Cardiovascular system:RRR Gastrointestinal system: Abdomen is nondistended, soft and nontender.  Central nervous system: Alert and oriented. No focal neurological deficits. Extremities: no LEE   Data Reviewed: I have personally reviewed following labs and imaging studies  CBC: Recent Labs  Lab 03/21/22 1745 03/22/22 0316  WBC 10.2 8.2  HGB 9.6* 9.4*  HCT 28.6* 26.9*  MCV 89.7 88.8  PLT 160 [id number]    Basic Metabolic Panel: Recent Labs  Lab 03/21/22 1745 03/22/22 0316  NA 138 138  K 4.6 4.2  CL 109 108  CO2 17* 16*  GLUCOSE 169* 160*  BUN 64* 63*  CREATININE 6.12* 5.91*  CALCIUM 8.5* 8.3*  MG  --  1.7  PHOS  --  4.4    GFR: Estimated Creatinine Clearance: 15.3 mL/min (Trishelle Devora) (by C-G formula based on SCr of 5.91 mg/dL (H)).  Liver Function Tests: Recent Labs  Lab 03/21/22 1745  AST 14*  ALT 7  ALKPHOS 67  BILITOT 0.6  PROT 7.7  ALBUMIN 3.8    CBG: Recent Labs  Lab 03/22/22 0837 03/22/22 1205  GLUCAP 159* 137*     Recent Results (from the past 240 hour(s))  Resp panel by RT-PCR (RSV, Flu Graydon Fofana&B, Covid) Anterior Nasal Swab     Status: None   Collection Time: 03/22/22 11:37 AM   Specimen: Anterior Nasal Swab  Result Value Ref Range Status   SARS Coronavirus 2 by RT PCR NEGATIVE NEGATIVE Final    Influenza Necie Wilcoxson by PCR NEGATIVE NEGATIVE Final   Influenza B by PCR NEGATIVE NEGATIVE Final    Comment: (NOTE) The Xpert Xpress SARS-CoV-2/FLU/RSV plus assay is intended as an aid in the diagnosis of influenza from Nasopharyngeal swab specimens and should not be used as Braeson Rupe sole basis for treatment. Nasal washings and aspirates are unacceptable for Xpert Xpress SARS-CoV-2/FLU/RSV testing.  Fact Sheet for Patients: EntrepreneurPulse.com.au  Fact Sheet for Healthcare Providers: IncredibleEmployment.be  This test is not yet approved or cleared by the [place] FDA and has been authorized for detection and/or diagnosis of SARS-CoV-2 by FDA under an Emergency Use  Authorization (EUA). This EUA will remain in effect (meaning this test can be used) for the duration of the COVID-19 declaration under Section 564(b)(1) of the Act, 21 U.S.C. section 360bbb-3(b)(1), unless the authorization is terminated or revoked.     Resp Syncytial Virus by PCR NEGATIVE NEGATIVE Final    Comment: (NOTE) Fact Sheet for Patients: EntrepreneurPulse.com.au  Fact Sheet for Healthcare Providers: IncredibleEmployment.be  This test is not yet approved or cleared by the [place] FDA and has been authorized for detection and/or diagnosis of SARS-CoV-2 by FDA under an Emergency Use Authorization (EUA). This EUA will remain in effect (meaning this test can be used) for the duration of the COVID-19 declaration under Section 564(b)(1) of the Act, 21 U.S.C. section 360bbb-3(b)(1), unless the authorization is terminated or revoked.  Performed at [facility], [address], [place], [place] [address]          Radiology Studies: DG Chest Portable 1 View  Result Date: 03/21/2022 CLINICAL DATA:  Shortness of breath. EXAM: PORTABLE CHEST 1 VIEW COMPARISON:  Chest radiograph dated 01/24/2022 and CT dated 01/24/2022. FINDINGS: Mild  cardiomegaly with mild vascular congestion. No focal consolidation, pleural effusion, pneumothorax. No acute osseous pathology. IMPRESSION: Mild cardiomegaly with mild vascular congestion. Electronically Signed   By: Anner Crete M.D.   On: 03/21/2022 20:17        Scheduled Meds:  amLODipine  10 mg Oral Daily   [START ON 03/23/2022] famotidine  20 mg Oral Daily   furosemide  80 mg Oral BID   heparin  5,000 Units Subcutaneous Q8H   insulin aspart  0-5 Units Subcutaneous QHS   insulin aspart  0-9 Units Subcutaneous TID WC   rosuvastatin  10 mg Oral Daily   sodium bicarbonate  1,300 mg Oral BID   tamsulosin  0.4 mg Oral Daily   Continuous Infusions:  meropenem (MERREM) IV 500 mg (03/22/22 0555)     LOS: 0 days    Time spent: over 30 min    Fayrene Helper, MD [facility]   To contact the attending provider between 7A-7P or the covering provider during after hours 7P-7A, please log into the web site [email or web address] and access using universal [place] password for that web site. If you do not have the password, please call the hospital operator.  03/22/2022, 3:21 PM

## 2022-03-22 NOTE — H&P (Addendum)
History and Physical  Ryan Jenkins [id number] DOB: 20-Dec-1960 DOA: 03/21/2022  Referring physician: Accepted by Dr. Myna Hidalgo, [facility]. PCP: [name], MD  Outpatient Specialists: Nephrology, cardiology, neurology. Patient coming from: Home through [facility] ED.  Chief Complaint: Cloudy urine, foul-smelling urine, nausea and vomiting.  HPI: Ryan Jenkins is a 62 y.o. male with medical history significant for HFpEF, CKD 4, type 2 diabetes, history of ESBL Klebsiella UTI who initially presented to [facility] ED with complaints of cloudy urine, foul-smelling urine for the past few weeks.  Associated with nausea and vomiting x 1 today, intermittent nonproductive cough, and pleuritic pain worse with coughing.  In the ED, noted to be hypoxic with O2 saturation of 88% with concern for mild pulmonary edema seen on chest x-ray.  Hypoxia improved on 3 L nasal cannula to maintain O2 saturation greater than 92%.    Workup revealed UA positive for pyuria.  Was started on empiric IV antibiotics Rocephin.  Workup also revealed AKI on CKD 4 with creatinine of 6.12 from baseline creatinine of 4.6.  BNP elevated greater than 1100 with mild cardiomegaly, mild vascular congestion on chest x-ray, personally reviewed.  He received 1 dose of IV Lasix 40 mg x 1 in the ED.  TRH was asked to admit.  The patient was accepted by Dr. Myna Hidalgo, [facility].  He was transferred to [facility] telemetry cardiac unit as inpatient status.   ED Course: Tmax 99.7.  BP 194/85, pulse 107, respiratory rate 20, O2 saturation 96%-100% on 3 L.  Lab studies remarkable for BNP eleven 1113.1.  Troponin 60, 59.  BUN 64, creatinine 6.12.  Serum bicarb 17.  Hemoglobin 9.4.  Review of Systems: Review of systems as noted in the HPI. All other systems reviewed and are negative.   Past Medical History:  Diagnosis Date   Anemia    Anginal pain ([place])    Anxiety    Arthritis    BPH with obstruction/lower urinary tract symptoms    CAD  (coronary artery disease)    cardiologist--- dr Junius Roads badal;  11-06-2019 cardiac cath in [place] (result in care everywhere)  nonobstructive cad involing pRCA 60% (done in setting worseing CHF/ acute pulmonary edema requiring intubation/ AKI   Chronic combined systolic and diastolic CHF (congestive heart failure) ([place])    Chronic kidney disease, stage IV (severe) ([place])    nephrologist--- dr Carolin Sicks   Depression    Diabetic neuropathy ([place])    Dyspnea    Edema of both lower extremities    GERD (gastroesophageal reflux disease)    Heart murmur    History of community acquired pneumonia    admission 06-04-2021 in [facility]  w/ ARF hypoxia w/ severe sepsis   Hyperlipidemia    Hypertension    Insulin dependent type 2 diabetes mellitus ([place])    Pneumonia    Retinopathy due to secondary diabetes ([place])    Uses walker    Vitamin B12 deficiency    Vitreous hemorrhage [facility])    Past Surgical History:  Procedure Laterality Date   CARDIAC CATHETERIZATION  11/06/2019   [place] in [place];    nonobstructive CAD , pRCA 60% (result in care everywhere)   CATARACT EXTRACTION W/ INTRAOCULAR LENS IMPLANT Bilateral 2017   TRANSURETHRAL RESECTION OF PROSTATE N/A 10/16/2021   Procedure: TRANSURETHRAL RESECTION OF THE PROSTATE (TURP);  Surgeon: Janith Lima, MD;  Location: [facility] ORS;  Service: Urology;  Laterality: N/A;    Social History:  reports that he has  never smoked. He has never used smokeless tobacco. He reports that he does not drink alcohol and does not use drugs.   Allergies  Allergen Reactions   Claritin [Loratadine] Swelling    Joint swelling    Hydrochlorothiazide Other (See Comments)    Dizziness   Latex Hives and Swelling   Lyrica [Pregabalin] Other (See Comments)    Depression. "Makes me loopy"    Metformin And Related Diarrhea and Nausea And Vomiting    Family History  Problem Relation Age of Onset   Renal cancer Mother    Hypertension Mother    Pancreatic  cancer Mother    Hypertension Sister    Stroke Sister    Leukemia Maternal Uncle    Sickle cell trait Maternal Aunt    Colon cancer Neg Hx    Esophageal cancer Neg Hx    Rectal cancer Neg Hx    Stomach cancer Neg Hx       Prior to Admission medications   Medication Sig Start Date End Date Taking? Authorizing Provider  acetaminophen (TYLENOL) 500 MG tablet Take 1,000 mg by mouth daily as needed for mild pain or headache.    [provider]  amLODipine (NORVASC) 10 MG tablet Take 1 tablet (10 mg total) by mouth daily. 02/08/21   Danford, Suann Larry, MD  calcitRIOL (ROCALTROL) 0.25 MCG capsule Take 0.25 mcg by mouth daily. 01/01/22   [provider]  Cholecalciferol (VITAMIN D3) 1.25 MG (50000 UT) CAPS Take 50,000 Units by mouth once a week. 09/12/21   [provider]  clopidogrel (PLAVIX) 75 MG tablet Take 1 tablet (75 mg total) by mouth daily. 07/05/21   de Yolanda Manges, Cortney E, NP  famotidine (PEPCID) 40 MG tablet Take 40 mg by mouth daily. 08/08/21   [provider]  finasteride (PROSCAR) 5 MG tablet Take 1 tablet (5 mg total) by mouth daily. 07/20/20   Charlott Rakes, MD  furosemide (LASIX) 80 MG tablet Take 1 tablet (80 mg total) by mouth 2 (two) times daily. 01/26/22 02/25/22  Bonnell Public, MD  gabapentin (NEURONTIN) 300 MG capsule Take 1 capsule (300 mg total) by mouth daily as needed (neuropathic pain). Patient taking differently: Take 300 mg by mouth 3 (three) times daily. 11/29/21   Barton Dubois, MD  hydrALAZINE (APRESOLINE) 100 MG tablet Take 1 tablet (100 mg total) by mouth 2 (two) times daily. 01/26/22 02/25/22  Bonnell Public, MD  LANTUS SOLOSTAR 100 UNIT/ML Solostar Pen Inject 22 Units into the skin at bedtime. Patient taking differently: Inject 30 Units into the skin at bedtime. 11/29/21   Barton Dubois, MD  meclizine (ANTIVERT) 25 MG tablet Take 25 mg by mouth as needed for dizziness. 08/08/21   [provider]  metoprolol  succinate (TOPROL-XL) 50 MG 24 hr tablet Take 1 tablet (50 mg total) by mouth at bedtime. Take with or immediately following a meal. Patient not taking: Reported on 01/25/2022 02/08/21   Edwin Dada, MD  rosuvastatin (CRESTOR) 10 MG tablet Take 10 mg by mouth daily.    [provider]  sodium bicarbonate 650 MG tablet Take 650 mg by mouth 2 (two) times daily. 09/20/21   [provider]  tamsulosin (FLOMAX) 0.4 MG CAPS capsule Take 0.4 mg by mouth daily. 05/25/21   [provider]    Physical Exam: BP (!) 184/85 (BP Location: Left Arm)   Pulse (!) 107   Temp 99.7 F (37.6 C) (Oral)   Resp 20  SpO2 96%   General: 62 y.o. year-old male well developed well nourished in no acute distress.  Alert and oriented x3. Cardiovascular: Regular rate and rhythm with no rubs or gallops.  No thyromegaly or JVD noted.  No lower extremity edema. 2/4 pulses in all 4 extremities. Respiratory: Faint rales at bases. Good inspiratory effort. Abdomen: Soft nontender nondistended with normal bowel sounds x4 quadrants. Muskuloskeletal: No cyanosis, clubbing or edema noted bilaterally Neuro: CN II-XII intact, strength, sensation, reflexes Skin: No ulcerative lesions noted or rashes Psychiatry: Judgement and insight appear normal. Mood is appropriate for condition and setting          Labs on Admission:  Basic Metabolic Panel: Recent Labs  Lab 03/21/22 1745  NA 138  K 4.6  CL 109  CO2 17*  GLUCOSE 169*  BUN 64*  CREATININE 6.12*  CALCIUM 8.5*   Liver Function Tests: Recent Labs  Lab 03/21/22 1745  AST 14*  ALT 7  ALKPHOS 67  BILITOT 0.6  PROT 7.7  ALBUMIN 3.8   No results for input(s): "LIPASE", "AMYLASE" in the last 168 hours. No results for input(s): "AMMONIA" in the last 168 hours. CBC: Recent Labs  Lab 03/21/22 1745  WBC 10.2  HGB 9.6*  HCT 28.6*  MCV 89.7  PLT 160   Cardiac Enzymes: No results for input(s): "CKTOTAL", "CKMB", "CKMBINDEX",  "TROPONINI" in the last 168 hours.  BNP (last 3 results) Recent Labs    12/22/21 0730 01/24/22 1527 03/21/22 1745  BNP 1,896.6* 1,653.7* 1,113.1*    ProBNP (last 3 results) No results for input(s): "PROBNP" in the last 8760 hours.  CBG: No results for input(s): "GLUCAP" in the last 168 hours.  Radiological Exams on Admission: DG Chest Portable 1 View  Result Date: 03/21/2022 CLINICAL DATA:  Shortness of breath. EXAM: PORTABLE CHEST 1 VIEW COMPARISON:  Chest radiograph dated 01/24/2022 and CT dated 01/24/2022. FINDINGS: Mild cardiomegaly with mild vascular congestion. No focal consolidation, pleural effusion, pneumothorax. No acute osseous pathology. IMPRESSION: Mild cardiomegaly with mild vascular congestion. Electronically Signed   By: Anner Crete M.D.   On: 03/21/2022 20:17    EKG: I independently viewed the EKG done and my findings are as followed: Sinus tachycardia rate of 100.  Nonspecific ST-T changes.  QTc 492.  Assessment/Plan Present on Admission:  Acute respiratory failure with hypoxia (HCC)  Principal Problem:   Acute respiratory failure with hypoxia (HCC)  Acute hypoxic respiratory failure secondary to mild pulmonary edema seen on chest x-ray, personally reviewed Received 1 dose of IV Lasix 40 mg x 1 in the ED Not on oxygen supplementation at baseline Currently on 3 L [place] to maintain O2 saturation greater than 92% Wean off oxygen supplementation as tolerated Incentive spirometer. Mobilize as tolerated.  AKI on CKD 4 Creatinine on presentation 6.12 from baseline creatinine of 4.6 Avoid nephrotoxic agents and hypotension. Closely monitor urine output with strict I's and O's. Creatinine is down-trending on repeat chemistry panel, 5.91 from 6.12 Repeat BMP in the morning  UTI, POA History of ESBL Klebsiella UTI Follow urine culture Start Merrem empirically until ESBL Klebsiella is ruled out  Non anion gap metabolic acidosis Serum bicarb 16 Anion gap  14 P.o. bicarb tablet x 3 days. Repeat BMP in the morning.  Elevated troponin Elevated BNP High-sensitivity troponin is downtrending, peaked at 60. No evidence of acute ischemia on twelve-lead EKG Continue to monitor on telemetry. Had a recent echocardiogram from 12/23/2021, showing LVEF 55 to 60%, with grade 2 diastolic dysfunction.  Monitor strict I's and O's and daily weight  Mild pulmonary edema Pleuritic pain, worse with coughing Cough is nonproductive, suspect from pulmonary edema. Afebrile with no leukocytosis. Continue to closely monitor.  Type 2 diabetes with hyperglycemia Obtain hemoglobin A1c Start insulin sliding scale.  Anemia of chronic disease in the setting of advanced CKD. Hemoglobin at baseline 9.4. No overt bleeding reported. Monitor H&H   Critical care time: 65 minutes.    DVT prophylaxis: Subcu heparin 3 times daily  Code Status: Full code  Family Communication: None at bedside  Disposition Plan: Admitted to telemetry cardiac unit  Consults called: None.  Admission status: Inpatient status.   Status is: Inpatient The patient requires at least 2 midnights for further evaluation and treatment of present condition.   Kayleen Memos MD [facility] Pager [phone]  If 7PM-7AM, please contact night-coverage [email or web address] Password [facility]  03/22/2022, 2:58 AM

## 2022-03-23 ENCOUNTER — Inpatient Hospital Stay (HOSPITAL_COMMUNITY): Payer: Medicaid Other

## 2022-03-23 DIAGNOSIS — J9601 Acute respiratory failure with hypoxia: Secondary | ICD-10-CM | POA: Diagnosis not present

## 2022-03-23 LAB — RENAL FUNCTION PANEL
Albumin: 2.7 g/dL — ABNORMAL LOW (ref 3.5–5.0)
Anion gap: 13 (ref 5–15)
BUN: 66 mg/dL — ABNORMAL HIGH (ref 8–23)
CO2: 18 mmol/L — ABNORMAL LOW (ref 22–32)
Calcium: 8.4 mg/dL — ABNORMAL LOW (ref 8.9–10.3)
Chloride: 108 mmol/L (ref 98–111)
Creatinine, Ser: 5.78 mg/dL — ABNORMAL HIGH (ref 0.61–1.24)
GFR, Estimated: 10 mL/min — ABNORMAL LOW (ref >60)
Glucose, Bld: 100 mg/dL — ABNORMAL HIGH (ref 70–99)
Phosphorus: 5.3 mg/dL — ABNORMAL HIGH (ref 2.5–4.6)
Potassium: 4.1 mmol/L (ref 3.5–5.1)
Sodium: 139 mmol/L (ref 135–145)

## 2022-03-23 LAB — CBC
HCT: 25.6 % — ABNORMAL LOW (ref 39.0–52.0)
Hemoglobin: 8.5 g/dL — ABNORMAL LOW (ref 13.0–17.0)
MCH: 30.1 pg (ref 26.0–34.0)
MCHC: 33.2 g/dL (ref 30.0–36.0)
MCV: 90.8 fL (ref 80.0–100.0)
Platelets: 251 10*3/uL (ref 150–400)
RBC: 2.82 MIL/uL — ABNORMAL LOW (ref 4.22–5.81)
RDW: 14.7 % (ref 11.5–15.5)
WBC: 5.9 10*3/uL (ref 4.0–10.5)
nRBC: 0 % (ref 0.0–0.2)

## 2022-03-23 LAB — GLUCOSE, CAPILLARY
Glucose-Capillary: 102 mg/dL — ABNORMAL HIGH (ref 70–99)
Glucose-Capillary: 168 mg/dL — ABNORMAL HIGH (ref 70–99)
Glucose-Capillary: 164 mg/dL — ABNORMAL HIGH (ref 70–99)
Glucose-Capillary: 147 mg/dL — ABNORMAL HIGH (ref 70–99)

## 2022-03-23 MED ORDER — OXYCODONE HCL 5 MG PO TABS
5.0000 mg | ORAL_TABLET | Freq: Three times a day (TID) | ORAL | Status: DC | PRN
  Administered 2022-03-23 – 2022-03-26 (×5): 5 mg via ORAL
  Filled 2022-03-23 (×5): qty 1

## 2022-03-23 MED ORDER — OXYCODONE HCL 5 MG PO TABS: 5.0000 mg | ORAL_TABLET | Freq: Three times a day (TID) | ORAL | Status: DC | PRN

## 2022-03-23 MED ORDER — OXYCODONE HCL 5 MG PO TABS
2.5000 mg | ORAL_TABLET | Freq: Three times a day (TID) | ORAL | Status: DC | PRN
  Administered 2022-03-26: 2.5 mg via ORAL
  Filled 2022-03-23: qty 1

## 2022-03-23 MED ORDER — SODIUM CHLORIDE 0.9 % IV SOLN
500.0000 mg | Freq: Two times a day (BID) | INTRAVENOUS | Status: AC
  Administered 2022-03-23 – 2022-03-28 (×12): 500 mg via INTRAVENOUS
  Filled 2022-03-23 (×14): qty 10

## 2022-03-23 MED ORDER — OXYCODONE HCL 5 MG PO TABS: 2.5000 mg | ORAL_TABLET | Freq: Three times a day (TID) | ORAL | Status: DC | PRN

## 2022-03-23 NOTE — Progress Notes (Signed)
Mobility Specialist Progress Note:   03/23/22 1455  Mobility  Activity Ambulated with assistance in hallway  Level of Assistance Contact guard assist, steadying assist  Assistive Device Front wheel walker  Distance Ambulated (ft) 150 ft  Activity Response Tolerated well  Mobility Referral Yes  $Mobility charge 1 Mobility   Pt eager for mobility session. Required only minG assist during ambulation. SpO2 high 90s throughout on RA. Distance limited by generalized weakness. Left sitting up in chair with all needs met.   Nelta Numbers Mobility Specialist Please contact via SecureChat or  Rehab office at [phone]

## 2022-03-23 NOTE — Progress Notes (Signed)
PROGRESS NOTE    Ryan Jenkins  [id number] DOB: 08/07/1960 DOA: 03/21/2022 PCP: [name], MD  Chief Complaint  Patient presents with   Dysuria    Brief Narrative:   Ryan Jenkins is Ryan Jenkins 62 y.o. male with medical history significant for HFpEF, CKD 4, type 2 diabetes, history of ESBL Klebsiella UTI who initially presented to [facility] ED with complaints of cloudy urine, foul-smelling urine for the past few weeks.  Associated with nausea and vomiting x 1 today, intermittent nonproductive cough, and pleuritic pain worse with coughing.   In the ED, noted to be hypoxic with O2 saturation of 88% with concern for mild pulmonary edema seen on chest x-ray.  Hypoxia improved on 3 L nasal cannula to maintain O2 saturation greater than 92%.     Workup revealed UA positive for pyuria.  Was started on empiric IV antibiotics Rocephin.  Workup also revealed AKI on CKD 4 with creatinine of 6.12 from baseline creatinine of 4.6.  BNP elevated greater than 1100 with mild cardiomegaly, mild vascular congestion on chest x-ray, personally reviewed.  He received 1 dose of IV Lasix 40 mg x 1 in the ED.  TRH was asked to admit.  The patient was accepted by Dr. Myna Hidalgo, [facility]  He was transferred to [facility] telemetry cardiac unit as inpatient status.    ED Course: Tmax 99.7.  BP 194/85, pulse 107, respiratory rate 20, O2 saturation 96%-100% on 3 L.  Lab studies remarkable for BNP eleven 1113.1.  Troponin 60, 59.  BUN 64, creatinine 6.12.  Serum bicarb 17.  Hemoglobin 9.4.  Assessment & Plan:   Principal Problem:   Acute respiratory failure with hypoxia (HCC)  Reported Fever Negative covid, RSV, influenza Negative RVP UA concerning for possible UTI, borderline symptoms - didn't report dysuria to me, but on review of EDP note, did report dysuria initially will follow culture and continue abx for now  Acute hypoxic respiratory failure  Cough  Pleuritic Chest Pain Concern for infectious  etiology vs volume in the setting of his renal disease/HF Currently requiring 3 L by [place] Follow RVP as noted above S/p IV lasix x1 dose Will resume home lasix dose and follow  Low threshold for renal consult Strict I/O, daily weights CXR 3/8 with improved vascular congestion/pulmonary edema  AKI on CKD 4 Creatinine on presentation 6.12 from baseline creatinine of 4.6 Improving again today, will continue to monitor while resuming home lasix dosing Avoid nephrotoxic agents and hypotension. Closely monitor urine output with strict I's and O's.   Pyuria History of ESBL Klebsiella UTI Follow urine culture - with klebsiella Continue abx as above   Non anion gap metabolic acidosis Related to CKD   Elevated troponin Elevated BNP Suspect related to CKD Had Leylanie Woodmansee recent echocardiogram from 12/23/2021, showing LVEF 55 to 60%, with grade 2 diastolic dysfunction. Monitor strict I's and O's and daily weight   Type 2 diabetes with hyperglycemia A1c 8.4 09/2021 SSI  A1c 7.4   Anemia of chronic disease in the setting of advanced CKD. Mild downtrend today, follow   Shoulder Pain Degenerative changes of AC joint    DVT prophylaxis: heparin Code Status: full Family Communication: none Disposition:   Status is: Inpatient Remains inpatient appropriate because: pending further improvement   Consultants:  none  Procedures:  none  Antimicrobials:  Anti-infectives (From admission, onward)    Start     Dose/Rate Route Frequency Ordered Stop   03/23/22 1045  meropenem (MERREM) 500  mg in sodium chloride 0.9 % 100 mL IVPB        500 mg 200 mL/hr over 30 Minutes Intravenous Every 12 hours 03/23/22 0958     03/22/22 1800  cefTRIAXone (ROCEPHIN) 2 g in sodium chloride 0.9 % 100 mL IVPB  Status:  Discontinued        2 g 200 mL/hr over 30 Minutes Intravenous Every 24 hours 03/22/22 0300 03/22/22 0441   03/22/22 0600  meropenem (MERREM) 500 mg in sodium chloride 0.9 % 100 mL IVPB  Status:   Discontinued        500 mg 200 mL/hr over 30 Minutes Intravenous Every 12 hours 03/22/22 0447 03/22/22 1530   03/21/22 1945  cefTRIAXone (ROCEPHIN) 2 g in sodium chloride 0.9 % 100 mL IVPB        2 g 200 mL/hr over 30 Minutes Intravenous  Once 03/21/22 1936 03/21/22 2059       Subjective: Denies new complaints  Objective: Vitals:   03/22/22 2000 03/23/22 0540 03/23/22 0811 03/23/22 1136  BP: (!) 148/79 (!) 151/83 (!) 163/80 (!) 163/87  Pulse: 94 88 92   Resp: 17 (!) 21 17   Temp: 98.1 F (36.7 C) 97.8 F (36.6 C) 97.8 F (36.6 C) 98.1 F (36.7 C)  TempSrc: Oral Oral Oral Oral  SpO2: 93% 98% 100%   Weight:  98.5 kg    Height:        Intake/Output Summary (Last 24 hours) at 03/23/2022 1609 Last data filed at 03/23/2022 1400 Gross per 24 hour  Intake 360.67 ml  Output 2375 ml  Net -2014.33 ml   Filed Weights   03/22/22 0351 03/23/22 0540  Weight: 97.2 kg 98.5 kg    Examination:  General: No acute distress. Cardiovascular: RRR Lungs: unlabored Abdomen: Soft, nontender, nondistended  Neurological: Alert and oriented 3. Moves all extremities 4 with equal strength. Cranial nerves II through XII grossly intact. Extremities: No clubbing or cyanosis. No edema.   Data Reviewed: I have personally reviewed following labs and imaging studies  CBC: Recent Labs  Lab 03/21/22 1745 03/22/22 0316 03/23/22 0228  WBC 10.2 8.2 5.9  HGB 9.6* 9.4* 8.5*  HCT 28.6* 26.9* 25.6*  MCV 89.7 88.8 90.8  PLT 160 245 [id number]    Basic Metabolic Panel: Recent Labs  Lab 03/21/22 1745 03/22/22 0316 03/23/22 0228  NA 138 138 139  K 4.6 4.2 4.1  CL 109 108 108  CO2 17* 16* 18*  GLUCOSE 169* 160* 100*  BUN 64* 63* 66*  CREATININE 6.12* 5.91* 5.78*  CALCIUM 8.5* 8.3* 8.4*  MG  --  1.7  --   PHOS  --  4.4 5.3*    GFR: Estimated Creatinine Clearance: 15.6 mL/min (Teleshia Lemere) (by C-G formula based on SCr of 5.78 mg/dL (H)).  Liver Function Tests: Recent Labs  Lab 03/21/22 1745  03/23/22 0228  AST 14*  --   ALT 7  --   ALKPHOS 67  --   BILITOT 0.6  --   PROT 7.7  --   ALBUMIN 3.8 2.7*    CBG: Recent Labs  Lab 03/22/22 1205 03/22/22 1558 03/22/22 2100 03/23/22 0809 03/23/22 1133  GLUCAP 137* 89 114* 102* 168*     Recent Results (from the past 240 hour(s))  Urine Culture (for pregnant, neutropenic or urologic patients or patients with an indwelling urinary catheter)     Status: Abnormal (Preliminary result)   Collection Time: 03/21/22  7:22 PM   Specimen: Urine, Clean  Catch  Result Value Ref Range Status   Specimen Description URINE, CLEAN CATCH  Final   Special Requests NONE  Final   Culture (Daichi Moris)  Final    >=100,000 COLONIES/mL KLEBSIELLA PNEUMONIAE SUSCEPTIBILITIES TO FOLLOW Performed at [facility], [address] [place], [place] [address]    Report Status PENDING  Incomplete  Resp panel by RT-PCR (RSV, Flu Arihant Pennings&B, Covid) Anterior Nasal Swab     Status: None   Collection Time: 03/22/22 11:37 AM   Specimen: Anterior Nasal Swab  Result Value Ref Range Status   SARS Coronavirus 2 by RT PCR NEGATIVE NEGATIVE Final   Influenza Hass[place] Crite by PCR NEGATIVE NEGATIVE Final   Influenza B by PCR NEGATIVE NEGATIVE Final    Comment: (NOTE) The Xpert Xpress SARS-CoV-2/FLU/RSV plus assay is intended as an aid in the diagnosis of influenza from Nasopharyngeal swab specimens and should not be used as Hersey Maclellan sole basis for treatment. Nasal washings and aspirates are unacceptable for Xpert Xpress SARS-CoV-2/FLU/RSV testing.  Fact Sheet for Patients: EntrepreneurPulse.com.au  Fact Sheet for Healthcare Providers: IncredibleEmployment.be  This test is not yet approved or cleared by the [place] FDA and has been authorized for detection and/or diagnosis of SARS-CoV-2 by FDA under an Emergency Use Authorization (EUA). This EUA will remain in effect (meaning this test can be used) for the duration of the COVID-19 declaration  under Section 564(b)(1) of the Act, 21 U.S.C. section 360bbb-3(b)(1), unless the authorization is terminated or revoked.     Resp Syncytial Virus by PCR NEGATIVE NEGATIVE Final    Comment: (NOTE) Fact Sheet for Patients: EntrepreneurPulse.com.au  Fact Sheet for Healthcare Providers: IncredibleEmployment.be  This test is not yet approved or cleared by the [place] FDA and has been authorized for detection and/or diagnosis of SARS-CoV-2 by FDA under an Emergency Use Authorization (EUA). This EUA will remain in effect (meaning this test can be used) for the duration of the COVID-19 declaration under Section 564(b)(1) of the Act, 21 U.S.C. section 360bbb-3(b)(1), unless the authorization is terminated or revoked.  Performed at [facility], [place] [address], [place],  [address]   Respiratory (~20 pathogens) panel by PCR     Status: None   Collection Time: 03/22/22  4:00 PM   Specimen: Nasopharyngeal Swab; Respiratory  Result Value Ref Range Status   Adenovirus NOT DETECTED NOT DETECTED Final   Coronavirus 229E NOT DETECTED NOT DETECTED Final    Comment: (NOTE) The Coronavirus on the Respiratory Panel, DOES NOT test for the novel  Coronavirus (2019 nCoV)    Coronavirus HKU1 NOT DETECTED NOT DETECTED Final   Coronavirus NL63 NOT DETECTED NOT DETECTED Final   Coronavirus OC43 NOT DETECTED NOT DETECTED Final   Metapneumovirus NOT DETECTED NOT DETECTED Final   Rhinovirus / Enterovirus NOT DETECTED NOT DETECTED Final   Influenza Dariana Garbett NOT DETECTED NOT DETECTED Final   Influenza B NOT DETECTED NOT DETECTED Final   Parainfluenza Virus 1 NOT DETECTED NOT DETECTED Final   Parainfluenza Virus 2 NOT DETECTED NOT DETECTED Final   Parainfluenza Virus 3 NOT DETECTED NOT DETECTED Final   Parainfluenza Virus 4 NOT DETECTED NOT DETECTED Final   Respiratory Syncytial Virus NOT DETECTED NOT DETECTED Final   Bordetella pertussis NOT DETECTED NOT  DETECTED Final   Bordetella Parapertussis NOT DETECTED NOT DETECTED Final   Chlamydophila pneumoniae NOT DETECTED NOT DETECTED Final   Mycoplasma pneumoniae NOT DETECTED NOT DETECTED Final    Comment: Performed at [facility], [address] [place], [place] [address]         Radiology Studies: DG CHEST PORT 1 VIEW  Result Date: 03/23/2022 CLINICAL DATA:  Shortness of breath EXAM: PORTABLE CHEST 1 VIEW COMPARISON:  Chest radiograph 2 days prior FINDINGS: Cardiomegaly is unchanged. The upper mediastinal contours are stable. Previously seen vascular congestion/pulmonary edema has improved. There is no new or worsening focal airspace disease. There is no pleural effusion. There is no pneumothorax There is no acute osseous abnormality. IMPRESSION: Improved vascular congestion/pulmonary edema. No new or worsening focal airspace disease. Electronically Signed   By: Valetta Mole M.D.   On: 03/23/2022 08:17   DG Shoulder 1V Right  Result Date: 03/22/2022 CLINICAL DATA:  Right shoulder pain.  No known accident or injury. EXAM: RIGHT SHOULDER - 1 VIEW COMPARISON:  Right shoulder radiographs 12/10/2020. FINDINGS: Single limited frontal view of the right shoulder. There are again small peripheral acromioclavicular degenerative spurs. No acute fracture is seen. No dislocation. The visualized portion of the right lung is unremarkable. IMPRESSION: Mild degenerative changes of the acromioclavicular joint. Electronically Signed   By: Yvonne Kendall M.D.   On: 03/22/2022 17:48   DG Chest Portable 1 View  Result Date: 03/21/2022 CLINICAL DATA:  Shortness of breath. EXAM: PORTABLE CHEST 1 VIEW COMPARISON:  Chest radiograph dated 01/24/2022 and CT dated 01/24/2022. FINDINGS: Mild cardiomegaly with mild vascular congestion. No focal consolidation, pleural effusion, pneumothorax. No acute osseous pathology. IMPRESSION: Mild cardiomegaly with mild vascular congestion. Electronically Signed   By: Anner Crete  M.D.   On: 03/21/2022 20:17        Scheduled Meds:  amLODipine  10 mg Oral Daily   famotidine  20 mg Oral Daily   furosemide  80 mg Oral BID   heparin  5,000 Units Subcutaneous Q8H   insulin aspart  0-5 Units Subcutaneous QHS   insulin aspart  0-9 Units Subcutaneous TID WC   lidocaine  1 patch Transdermal Q24H   rosuvastatin  10 mg Oral Daily   sodium bicarbonate  1,300 mg Oral BID   tamsulosin  0.4 mg Oral Daily   Continuous Infusions:  meropenem (MERREM) IV 500 mg (03/23/22 1037)     LOS: 1 day    Time spent: over 30 min    Fayrene Helper, MD [facility]   To contact the attending provider between 7A-7P or the covering provider during after hours 7P-7A, please log into the web site [email or web address] and access using universal [place] password for that web site. If you do not have the password, please call the hospital operator.  03/23/2022, 4:09 PM

## 2022-03-23 NOTE — Progress Notes (Signed)
Pt complains of intermittent chest pain that has gotten worse tonight. Vitals are stable and no acute ischemic features on EKG. Plan to check troponin.

## 2022-03-24 ENCOUNTER — Inpatient Hospital Stay (HOSPITAL_COMMUNITY): Payer: Medicaid Other

## 2022-03-24 ENCOUNTER — Encounter (HOSPITAL_COMMUNITY): Payer: Medicaid Other

## 2022-03-24 DIAGNOSIS — J9601 Acute respiratory failure with hypoxia: Secondary | ICD-10-CM | POA: Diagnosis not present

## 2022-03-24 LAB — CBC WITH DIFFERENTIAL/PLATELET
Abs Immature Granulocytes: 0.01 10*3/uL (ref 0.00–0.07)
Basophils Absolute: 0 10*3/uL (ref 0.0–0.1)
Basophils Relative: 1 %
Eosinophils Absolute: 0.4 10*3/uL (ref 0.0–0.5)
Eosinophils Relative: 7 %
HCT: 29.1 % — ABNORMAL LOW (ref 39.0–52.0)
Hemoglobin: 9.7 g/dL — ABNORMAL LOW (ref 13.0–17.0)
Immature Granulocytes: 0 %
Lymphocytes Relative: 18 %
Lymphs Abs: 1.1 10*3/uL (ref 0.7–4.0)
MCH: 29.9 pg (ref 26.0–34.0)
MCHC: 33.3 g/dL (ref 30.0–36.0)
MCV: 89.8 fL (ref 80.0–100.0)
Monocytes Absolute: 0.5 10*3/uL (ref 0.1–1.0)
Monocytes Relative: 9 %
Neutro Abs: 4.1 10*3/uL (ref 1.7–7.7)
Neutrophils Relative %: 65 %
Platelets: 336 10*3/uL (ref 150–400)
RBC: 3.24 MIL/uL — ABNORMAL LOW (ref 4.22–5.81)
RDW: 14.2 % (ref 11.5–15.5)
WBC: 6.2 10*3/uL (ref 4.0–10.5)
nRBC: 0 % (ref 0.0–0.2)

## 2022-03-24 LAB — COMPREHENSIVE METABOLIC PANEL
ALT: 9 U/L (ref 0–44)
AST: 14 U/L — ABNORMAL LOW (ref 15–41)
Albumin: 3.2 g/dL — ABNORMAL LOW (ref 3.5–5.0)
Alkaline Phosphatase: 61 U/L (ref 38–126)
Anion gap: 14 (ref 5–15)
BUN: 61 mg/dL — ABNORMAL HIGH (ref 8–23)
CO2: 18 mmol/L — ABNORMAL LOW (ref 22–32)
Calcium: 8.7 mg/dL — ABNORMAL LOW (ref 8.9–10.3)
Chloride: 106 mmol/L (ref 98–111)
Creatinine, Ser: 5.81 mg/dL — ABNORMAL HIGH (ref 0.61–1.24)
GFR, Estimated: 10 mL/min — ABNORMAL LOW (ref >60)
Glucose, Bld: 117 mg/dL — ABNORMAL HIGH (ref 70–99)
Potassium: 3.9 mmol/L (ref 3.5–5.1)
Sodium: 138 mmol/L (ref 135–145)
Total Bilirubin: 0.6 mg/dL (ref 0.3–1.2)
Total Protein: 7.5 g/dL (ref 6.5–8.1)

## 2022-03-24 LAB — URINE CULTURE: Culture: >=100000 — AB

## 2022-03-24 LAB — GLUCOSE, CAPILLARY
Glucose-Capillary: 111 mg/dL — ABNORMAL HIGH (ref 70–99)
Glucose-Capillary: 137 mg/dL — ABNORMAL HIGH (ref 70–99)
Glucose-Capillary: 103 mg/dL — ABNORMAL HIGH (ref 70–99)
Glucose-Capillary: 135 mg/dL — ABNORMAL HIGH (ref 70–99)

## 2022-03-24 LAB — TROPONIN I (HIGH SENSITIVITY)
Troponin I (High Sensitivity): 55 ng/L — ABNORMAL HIGH (ref <18)
Troponin I (High Sensitivity): 57 ng/L — ABNORMAL HIGH (ref <18)

## 2022-03-24 LAB — D-DIMER, QUANTITATIVE: D-Dimer, Quant: 1.35 ug/mL-FEU — ABNORMAL HIGH (ref 0.00–0.50)

## 2022-03-24 NOTE — Progress Notes (Signed)
PROGRESS NOTE    Ryan Jenkins  [id number] DOB: 1960/05/30 DOA: 03/21/2022 PCP: [name], MD  Chief Complaint  Patient presents with   Dysuria    Brief Narrative:   Ryan Jenkins is Ryan Jenkins 62 y.o. male with medical history significant for HFpEF, CKD 4, type 2 diabetes, history of ESBL Klebsiella UTI who initially presented to [facility] ED with complaints of cloudy urine, foul-smelling urine for the past few weeks.  Associated with nausea and vomiting x 1 today, intermittent nonproductive cough, and pleuritic pain worse with coughing.   In the ED, noted to be hypoxic with O2 saturation of 88% with concern for mild pulmonary edema seen on chest x-ray.  Hypoxia improved on 3 L nasal cannula to maintain O2 saturation greater than 92%.     Workup revealed UA positive for pyuria.  Was started on empiric IV antibiotics Rocephin.  Workup also revealed AKI on CKD 4 with creatinine of 6.12 from baseline creatinine of 4.6.  BNP elevated greater than 1100 with mild cardiomegaly, mild vascular congestion on chest x-ray, personally reviewed.  He received 1 dose of IV Lasix 40 mg x 1 in the ED.  TRH was asked to admit.  The patient was accepted by Dr. Myna Hidalgo, [facility].  He was transferred to [facility] telemetry cardiac unit as inpatient status.    ED Course: Tmax 99.7.  BP 194/85, pulse 107, respiratory rate 20, O2 saturation 96%-100% on 3 L.  Lab studies remarkable for BNP eleven 1113.1.  Troponin 60, 59.  BUN 64, creatinine 6.12.  Serum bicarb 17.  Hemoglobin 9.4.  Assessment & Plan:   Principal Problem:   Acute respiratory failure with hypoxia (HCC)  Chest Pain Elevated troponin Elevated BNP Reported overnight, troponin stable I think his chest pain is MSK as able to reproduce with palpation D dimer is elevated, low suspicion for PE as history not consistent with this, but will follow LE [name], consider VQ scan (though I think with lack for ongoing CP, resp symptoms, this is  unlikely) Follow LE [name] Elevated troponin likely related to CKD Had [facility] recent echocardiogram from 12/23/2021, showing LVEF 55 to 60%, with grade 2 diastolic dysfunction. Monitor strict I's and O's and daily weight  Reported Fever  ESBL Klebsiella UTI Negative covid, RSV, influenza Negative RVP Abx for klebsiella UTI, follow   Acute hypoxic respiratory failure  Cough  Pleuritic Chest Pain Concern for infectious etiology vs volume in the setting of his renal disease/HF Currently requiring 3 L by [place] Follow RVP as noted above S/p IV lasix x1 dose Will resume home lasix dose and follow  Low threshold for renal consult Strict I/O, daily weights CXR 3/8 with improved vascular congestion/pulmonary edema CT chest 3/9 without acute findings in chest, does have soft tissue edema, continue diuresis  AKI on CKD 4 Creatinine on presentation 6.12 from baseline creatinine of 4.6 Fluctuating, follow on oral home lasix  Avoid nephrotoxic agents and hypotension. Closely monitor urine output with strict I's and O's.   Pyuria History of ESBL Klebsiella UTI Follow urine culture - with klebsiella Continue abx as above   Non anion gap metabolic acidosis Related to CKD   Type 2 diabetes with hyperglycemia A1c 8.4 09/2021 SSI  A1c 7.4   Anemia of chronic disease in the setting of advanced CKD. Mild downtrend today, follow   Shoulder Pain Degenerative changes of AC joint    DVT prophylaxis: heparin Code Status: full Family Communication: none Disposition:  Status is: Inpatient Remains inpatient appropriate because: pending further improvement   Consultants:  none  Procedures:  none  Antimicrobials:  Anti-infectives (From admission, onward)    Start     Dose/Rate Route Frequency Ordered Stop   03/23/22 1045  meropenem (MERREM) 500 mg in sodium chloride 0.9 % 100 mL IVPB        500 mg 200 mL/hr over 30 Minutes Intravenous Every 12 hours 03/23/22 0958     03/22/22 1800   cefTRIAXone (ROCEPHIN) 2 g in sodium chloride 0.9 % 100 mL IVPB  Status:  Discontinued        2 g 200 mL/hr over 30 Minutes Intravenous Every 24 hours 03/22/22 0300 03/22/22 0441   03/22/22 0600  meropenem (MERREM) 500 mg in sodium chloride 0.9 % 100 mL IVPB  Status:  Discontinued        500 mg 200 mL/hr over 30 Minutes Intravenous Every 12 hours 03/22/22 0447 03/22/22 1530   03/21/22 1945  cefTRIAXone (ROCEPHIN) 2 g in sodium chloride 0.9 % 100 mL IVPB        2 g 200 mL/hr over 30 Minutes Intravenous  Once 03/21/22 1936 03/21/22 2059       Subjective: CP resolved Describes sharp pain lasting about 5 min every so often past month or so, not daily, maybe weekly   Objective: Vitals:   03/24/22 0343 03/24/22 0845 03/24/22 1151 03/24/22 1553  BP: (!) 159/77 (!) 170/84 (!) 164/87 (!) 150/86  Pulse: 84 89 96 79  Resp: 13 17 (!) 21 13  Temp: 99.6 F (37.6 C) 98.7 F (37.1 C) 98.2 F (36.8 C) 98.5 F (36.9 C)  TempSrc: Oral Oral Oral Oral  SpO2: 98% 97% 99% 96%  Weight: 98.2 kg     Height:        Intake/Output Summary (Last 24 hours) at 03/24/2022 1731 Last data filed at 03/24/2022 1300 Gross per 24 hour  Intake 200 ml  Output 1050 ml  Net -850 ml   Filed Weights   03/22/22 0351 03/23/22 0540 03/24/22 0343  Weight: 97.2 kg 98.5 kg 98.2 kg    Examination:  General: No acute distress. Cardiovascular: TTP with palpation of chest, regular rate, rhythm Lungs: unlabored Abdomen: Soft, nontender, nondistended  Neurological: Alert and oriented 3. Moves all extremities 4 with equal strength. Cranial nerves II through XII grossly intact. Extremities: No clubbing or cyanosis. No edema.   Data Reviewed: I have personally reviewed following labs and imaging studies  CBC: Recent Labs  Lab 03/21/22 1745 03/22/22 0316 03/23/22 0228 03/24/22 0951  WBC 10.2 8.2 5.9 6.2  NEUTROABS  --   --   --  4.1  HGB 9.6* 9.4* 8.5* 9.7*  HCT 28.6* 26.9* 25.6* 29.1*  MCV 89.7 88.8 90.8 89.8   PLT 160 245 251 [id number]    Basic Metabolic Panel: Recent Labs  Lab 03/21/22 1745 03/22/22 0316 03/23/22 0228 03/24/22 0951  NA 138 138 139 138  K 4.6 4.2 4.1 3.9  CL 109 108 108 106  CO2 17* 16* 18* 18*  GLUCOSE 169* 160* 100* 117*  BUN 64* 63* 66* 61*  CREATININE 6.12* 5.91* 5.78* 5.81*  CALCIUM 8.5* 8.3* 8.4* 8.7*  MG  --  1.7  --   --   PHOS  --  4.4 5.3*  --     GFR: Estimated Creatinine Clearance: 15.5 mL/min (Keiasha Diep) (by C-G formula based on SCr of 5.81 mg/dL (H)).  Liver Function Tests: Recent Labs  Lab  03/21/22 1745 03/23/22 0228 03/24/22 0951  AST 14*  --  14*  ALT 7  --  9  ALKPHOS 67  --  61  BILITOT 0.6  --  0.6  PROT 7.7  --  7.5  ALBUMIN 3.8 2.7* 3.2*    CBG: Recent Labs  Lab 03/23/22 1643 03/23/22 2025 03/24/22 0842 03/24/22 1150 03/24/22 1552  GLUCAP 164* 147* 111* 137* 103*     Recent Results (from the past 240 hour(s))  Urine Culture (for pregnant, neutropenic or urologic patients or patients with an indwelling urinary catheter)     Status: Abnormal   Collection Time: 03/21/22  7:22 PM   Specimen: Urine, Clean Catch  Result Value Ref Range Status   Specimen Description URINE, CLEAN CATCH  Final   Special Requests   Final    NONE Performed at [facility], [place] [address] [place], [place] [address]    Culture (Yariana Hoaglund)  Final    >=100,000 COLONIES/mL KLEBSIELLA PNEUMONIAE Confirmed Extended Spectrum Beta-Lactamase Producer (ESBL).  In bloodstream infections from ESBL organisms, carbapenems are preferred over piperacillin/tazobactam. They are shown to have Amelita Risinger lower risk of mortality.    Report Status 03/24/2022 FINAL  Final   Organism ID, Bacteria KLEBSIELLA PNEUMONIAE (Pranavi Aure)  Final      Susceptibility   Klebsiella pneumoniae - MIC*    AMPICILLIN >=32 RESISTANT Resistant     CEFAZOLIN >=64 RESISTANT Resistant     CEFEPIME >=32 RESISTANT Resistant     CEFTRIAXONE >=64 RESISTANT Resistant     CIPROFLOXACIN >=4 RESISTANT Resistant      GENTAMICIN <=1 SENSITIVE Sensitive     IMIPENEM 0.5 SENSITIVE Sensitive     NITROFURANTOIN 64 INTERMEDIATE Intermediate     TRIMETH/SULFA >=320 RESISTANT Resistant     AMPICILLIN/SULBACTAM >=32 RESISTANT Resistant     PIP/TAZO 8 SENSITIVE Sensitive     * >=100,000 COLONIES/mL KLEBSIELLA PNEUMONIAE  Resp panel by RT-PCR (RSV, Flu Salbador Fiveash&B, Covid) Anterior Nasal Swab     Status: None   Collection Time: 03/22/22 11:37 AM   Specimen: Anterior Nasal Swab  Result Value Ref Range Status   SARS Coronavirus 2 by RT PCR NEGATIVE NEGATIVE Final   Influenza Anais Koenen by PCR NEGATIVE NEGATIVE Final   Influenza B by PCR NEGATIVE NEGATIVE Final    Comment: (NOTE) The Xpert Xpress SARS-CoV-2/FLU/RSV plus assay is intended as an aid in the diagnosis of influenza from Nasopharyngeal swab specimens and should not be used as Jaylissa Felty sole basis for treatment. Nasal washings and aspirates are unacceptable for Xpert Xpress SARS-CoV-2/FLU/RSV testing.  Fact Sheet for Patients: EntrepreneurPulse.com.au  Fact Sheet for Healthcare Providers: IncredibleEmployment.be  This test is not yet approved or cleared by the [place] FDA and has been authorized for detection and/or diagnosis of SARS-CoV-2 by FDA under an Emergency Use Authorization (EUA). This EUA will remain in effect (meaning this test can be used) for the duration of the COVID-19 declaration under Section 564(b)(1) of the Act, 21 U.S.C. section 360bbb-3(b)(1), unless the authorization is terminated or revoked.     Resp Syncytial Virus by PCR NEGATIVE NEGATIVE Final    Comment: (NOTE) Fact Sheet for Patients: EntrepreneurPulse.com.au  Fact Sheet for Healthcare Providers: IncredibleEmployment.be  This test is not yet approved or cleared by the [place] FDA and has been authorized for detection and/or diagnosis of SARS-CoV-2 by FDA under an Emergency Use Authorization (EUA). This  EUA will remain in effect (meaning this test can be used) for the duration of the  COVID-19 declaration under Section 564(b)(1) of the Act, 21 U.S.C. section 360bbb-3(b)(1), unless the authorization is terminated or revoked.  Performed at [facility], [place] [address], [place], [place] [address]   Respiratory (~20 pathogens) panel by PCR     Status: None   Collection Time: 03/22/22  4:00 PM   Specimen: Nasopharyngeal Swab; Respiratory  Result Value Ref Range Status   Adenovirus NOT DETECTED NOT DETECTED Final   Coronavirus 229E NOT DETECTED NOT DETECTED Final    Comment: (NOTE) The Coronavirus on the Respiratory Panel, DOES NOT test for the novel  Coronavirus (2019 nCoV)    Coronavirus HKU1 NOT DETECTED NOT DETECTED Final   Coronavirus NL63 NOT DETECTED NOT DETECTED Final   Coronavirus OC43 NOT DETECTED NOT DETECTED Final   Metapneumovirus NOT DETECTED NOT DETECTED Final   Rhinovirus / Enterovirus NOT DETECTED NOT DETECTED Final   Influenza Khaniyah Bezek NOT DETECTED NOT DETECTED Final   Influenza B NOT DETECTED NOT DETECTED Final   Parainfluenza Virus 1 NOT DETECTED NOT DETECTED Final   Parainfluenza Virus 2 NOT DETECTED NOT DETECTED Final   Parainfluenza Virus 3 NOT DETECTED NOT DETECTED Final   Parainfluenza Virus 4 NOT DETECTED NOT DETECTED Final   Respiratory Syncytial Virus NOT DETECTED NOT DETECTED Final   Bordetella pertussis NOT DETECTED NOT DETECTED Final   Bordetella Parapertussis NOT DETECTED NOT DETECTED Final   Chlamydophila pneumoniae NOT DETECTED NOT DETECTED Final   Mycoplasma pneumoniae NOT DETECTED NOT DETECTED Final    Comment: Performed at [facility], [place]. [address], [place], [place] [address]         Radiology Studies: CT CHEST WO CONTRAST  Result Date: 03/24/2022 CLINICAL DATA:  Chest pain EXAM: CT CHEST WITHOUT CONTRAST TECHNIQUE: Multidetector CT imaging of the chest was performed following the standard protocol without IV contrast. RADIATION DOSE  REDUCTION: This exam was performed according to the departmental dose-optimization program which includes automated exposure control, adjustment of the mA and/or kV according to patient size and/or use of iterative reconstruction technique. COMPARISON:  01/24/2022 FINDINGS: Cardiovascular: The heart size is normal. Trace pericardial effusion, stable. Coronary artery calcification is evident. Mild atherosclerotic calcification is noted in the wall of the thoracic aorta. Mediastinum/Nodes: Increased number of small mediastinal lymph nodes are similar to prior with no individual node meeting CT size criteria for pathologic enlargement. No evidence for gross hilar lymphadenopathy although assessment is limited by the lack of intravenous contrast on the current study. The esophagus has normal imaging features. There is no axillary lymphadenopathy. Lungs/Pleura: No focal airspace consolidation. No suspicious pulmonary nodule or mass. Thickening of the left major fissure with 5 mm nodular component on 66/4, likely subpleural lymph node. No pulmonary edema or pleural effusion. Upper Abdomen: Unremarkable. Musculoskeletal: No worrisome lytic or sclerotic osseous abnormality. Dependent subcutaneous edema and skin thickening is similar to prior. IMPRESSION: 1. No acute findings in the chest. Specifically, no findings to explain the patient's history of chest pain. 2. Similar dependent subcutaneous edema and skin thickening suggests soft tissue edema. 3.  Aortic Atherosclerosis (ICD10-I70.0). Electronically Signed   By: Misty Stanley M.D.   On: 03/24/2022 15:58   DG CHEST PORT 1 VIEW  Result Date: 03/24/2022 CLINICAL DATA:  Chest pain EXAM: PORTABLE CHEST 1 VIEW COMPARISON:  Three hundred twenty-four FINDINGS: Single frontal view of the chest demonstrates an unremarkable cardiac silhouette. No airspace disease, effusion, or pneumothorax. No acute bony abnormalities. IMPRESSION: 1. No acute intrathoracic process.  Electronically Signed  By: Randa Ngo M.D.   On: 03/24/2022 10:12   DG CHEST PORT 1 VIEW  Result Date: 03/23/2022 CLINICAL DATA:  Shortness of breath EXAM: PORTABLE CHEST 1 VIEW COMPARISON:  Chest radiograph 2 days prior FINDINGS: Cardiomegaly is unchanged. The upper mediastinal contours are stable. Previously seen vascular congestion/pulmonary edema has improved. There is no new or worsening focal airspace disease. There is no pleural effusion. There is no pneumothorax There is no acute osseous abnormality. IMPRESSION: Improved vascular congestion/pulmonary edema. No new or worsening focal airspace disease. Electronically Signed   By: Valetta Mole M.D.   On: 03/23/2022 08:17        Scheduled Meds:  amLODipine  10 mg Oral Daily   famotidine  20 mg Oral Daily   furosemide  80 mg Oral BID   heparin  5,000 Units Subcutaneous Q8H   insulin aspart  0-5 Units Subcutaneous QHS   insulin aspart  0-9 Units Subcutaneous TID WC   lidocaine  1 patch Transdermal Q24H   rosuvastatin  10 mg Oral Daily   sodium bicarbonate  1,300 mg Oral BID   tamsulosin  0.4 mg Oral Daily   Continuous Infusions:  meropenem (MERREM) IV 500 mg (03/24/22 0856)     LOS: 2 days    Time spent: over 30 min    Fayrene Helper, MD [facility]   To contact the attending provider between 7A-7P or the covering provider during after hours 7P-7A, please log into the web site [email or web address] and access using universal [place] password for that web site. If you do not have the password, please call the hospital operator.  03/24/2022, 5:31 PM

## 2022-03-24 NOTE — Progress Notes (Signed)
OT Cancellation Note  Patient Details Name: Ryan Jenkins MRN: [id number] DOB: Oct 19, 1960   Cancelled Treatment:    Reason Eval/Treat Not Completed: Medical issues which prohibited therapy. Pt with chest pain and increased troponin, Dr. Florene Glen requesting hold until tomorrow. OT will follow up pending pt medical stability.   Ryan Jenkins, OTR/L [place] [facility] Ryan Jenkins Ryan Jenkins 03/24/2022, 3:06 PM

## 2022-03-24 NOTE — Progress Notes (Signed)
PT Cancellation Note  Patient Details Name: Ryan Jenkins MRN: [id number] DOB: [birth year]   Cancelled Treatment:    Reason Eval/Treat Not Completed: Medical issues which prohibited therapy.  Pt is a medical hold for therapy per MD and will revisit tomorrow.   Ramond Dial 03/24/2022, 1:57 PM  Mee Hives, PT PhD Acute Rehab Dept. Number: [place] and [place]

## 2022-03-24 NOTE — Progress Notes (Signed)
PT Cancellation Note  Patient Details Name: Ryan Jenkins MRN: [id number] DOB: 1960/11/30   Cancelled Treatment:    Reason Eval/Treat Not Completed: Medical issues which prohibited therapy.  Awaiting MD clearance after chest pain incident, follow up with pt as he can allow.   Ramond Dial 03/24/2022, 9:32 AM  Mee Hives, PT PhD Acute Rehab Dept. Number: [place] and [place]

## 2022-03-25 ENCOUNTER — Inpatient Hospital Stay (HOSPITAL_COMMUNITY): Payer: Medicaid Other

## 2022-03-25 DIAGNOSIS — J9601 Acute respiratory failure with hypoxia: Secondary | ICD-10-CM | POA: Diagnosis not present

## 2022-03-25 LAB — CBC WITH DIFFERENTIAL/PLATELET
Abs Immature Granulocytes: 0.01 10*3/uL (ref 0.00–0.07)
Basophils Absolute: 0 10*3/uL (ref 0.0–0.1)
Basophils Relative: 1 %
Eosinophils Absolute: 0.4 10*3/uL (ref 0.0–0.5)
Eosinophils Relative: 6 %
HCT: 26.8 % — ABNORMAL LOW (ref 39.0–52.0)
Hemoglobin: 8.9 g/dL — ABNORMAL LOW (ref 13.0–17.0)
Immature Granulocytes: 0 %
Lymphocytes Relative: 22 %
Lymphs Abs: 1.4 10*3/uL (ref 0.7–4.0)
MCH: 29.6 pg (ref 26.0–34.0)
MCHC: 33.2 g/dL (ref 30.0–36.0)
MCV: 89 fL (ref 80.0–100.0)
Monocytes Absolute: 0.7 10*3/uL (ref 0.1–1.0)
Monocytes Relative: 11 %
Neutro Abs: 3.9 10*3/uL (ref 1.7–7.7)
Neutrophils Relative %: 60 %
Platelets: 317 10*3/uL (ref 150–400)
RBC: 3.01 MIL/uL — ABNORMAL LOW (ref 4.22–5.81)
RDW: 14 % (ref 11.5–15.5)
WBC: 6.3 10*3/uL (ref 4.0–10.5)
nRBC: 0 % (ref 0.0–0.2)

## 2022-03-25 LAB — COMPREHENSIVE METABOLIC PANEL
ALT: 9 U/L (ref 0–44)
AST: 12 U/L — ABNORMAL LOW (ref 15–41)
Albumin: 2.9 g/dL — ABNORMAL LOW (ref 3.5–5.0)
Alkaline Phosphatase: 54 U/L (ref 38–126)
Anion gap: 13 (ref 5–15)
BUN: 62 mg/dL — ABNORMAL HIGH (ref 8–23)
CO2: 19 mmol/L — ABNORMAL LOW (ref 22–32)
Calcium: 8.5 mg/dL — ABNORMAL LOW (ref 8.9–10.3)
Chloride: 106 mmol/L (ref 98–111)
Creatinine, Ser: 5.7 mg/dL — ABNORMAL HIGH (ref 0.61–1.24)
GFR, Estimated: 11 mL/min — ABNORMAL LOW (ref >60)
Glucose, Bld: 103 mg/dL — ABNORMAL HIGH (ref 70–99)
Potassium: 3.9 mmol/L (ref 3.5–5.1)
Sodium: 138 mmol/L (ref 135–145)
Total Bilirubin: 0.3 mg/dL (ref 0.3–1.2)
Total Protein: 7 g/dL (ref 6.5–8.1)

## 2022-03-25 LAB — MAGNESIUM: Magnesium: 1.6 mg/dL — ABNORMAL LOW (ref 1.7–2.4)

## 2022-03-25 LAB — GLUCOSE, CAPILLARY
Glucose-Capillary: 104 mg/dL — ABNORMAL HIGH (ref 70–99)
Glucose-Capillary: 95 mg/dL (ref 70–99)
Glucose-Capillary: 114 mg/dL — ABNORMAL HIGH (ref 70–99)
Glucose-Capillary: 97 mg/dL (ref 70–99)

## 2022-03-25 LAB — PHOSPHORUS: Phosphorus: 5.9 mg/dL — ABNORMAL HIGH (ref 2.5–4.6)

## 2022-03-25 MED ORDER — MAGNESIUM OXIDE -MG SUPPLEMENT 400 (240 MG) MG PO TABS
400.0000 mg | ORAL_TABLET | Freq: Every day | ORAL | Status: DC
  Administered 2022-03-25 – 2022-03-29 (×5): 400 mg via ORAL
  Filled 2022-03-25 (×5): qty 1

## 2022-03-25 NOTE — Evaluation (Signed)
Occupational Therapy Evaluation Patient Details Name: Ryan Jenkins MRN: [id number] DOB: 06/06/1960 Today's Date: 03/25/2022   History of Present Illness Pt is a 62 y.o. male who presented initially presented to [facility] ED with complaints of UTI symptoms. Noted UA positive for pyuria and he was hypoxic with O2 saturation of 88% with concern for mild pulmonary edema seen on chest x-ray. He was transferred to [facility]. PMH includes: diabetes with neuropathy and gastroparesis, HTN, CKD 4, depression, anxiety, HLD, GERD, CHF, CAD, anemia   Clinical Impression   Ryan Jenkins was evaluated s/p the above admission list. He requires some assist for mobility and ADLs at baseline, he typically mobilizes with a WC vs rollator and has a PCA 3x/wk. Upon evaluation he was limited by pain, generalized weakness, decreased activity tolerance and unsteady balance. Overall he required min A for transfers with RW and up to min A for ADLs. Pt will benefit from continued acute OT services. Recommend d/c to home with [facility] and support of brother and aide as needed.       Recommendations for follow up therapy are one component of a multi-disciplinary discharge planning process, led by the attending physician.  Recommendations may be updated based on patient status, additional functional criteria and insurance authorization.   Follow Up Recommendations  Home health OT     Assistance Recommended at Discharge Intermittent Supervision/Assistance  Patient can return home with the following A little help with walking and/or transfers;A little help with bathing/dressing/bathroom;Assistance with cooking/housework;Assist for transportation;Help with stairs or ramp for entrance    Functional Status Assessment  Patient has had a recent decline in their functional status and demonstrates the ability to make significant improvements in function in a reasonable and predictable amount of time.  Equipment Recommendations  None  recommended by OT       Precautions / Restrictions Precautions Precautions: Fall Restrictions Weight Bearing Restrictions: No      Mobility Bed Mobility Overal bed mobility: Needs Assistance             General bed mobility comments: OOB in the chair upon arrival    Transfers Overall transfer level: Needs assistance Equipment used: Rolling walker (2 wheels) Transfers: Sit to/from Stand Sit to Stand: Min assist                  Balance Overall balance assessment: Needs assistance Sitting-balance support: Feet supported Sitting balance-Leahy Scale: Good     Standing balance support: During functional activity, Bilateral upper extremity supported Standing balance-Leahy Scale: Poor                             ADL either performed or assessed with clinical judgement   ADL Overall ADL's : Needs assistance/impaired Eating/Feeding: Independent;Sitting   Grooming: Sitting;Set up   Upper Body Bathing: Set up;Sitting   Lower Body Bathing: Sit to/from stand;Minimal assistance   Upper Body Dressing : Sitting;Set up   Lower Body Dressing: Sit to/from stand;Minimal assistance   Toilet Transfer: Minimal assistance;Ambulation;Rolling walker (2 wheels)   Toileting- Clothing Manipulation and Hygiene: Supervision/safety;Sitting/lateral lean       Functional mobility during ADLs: Minimal assistance;Rolling walker (2 wheels) General ADL Comments: limited by LLE pain and generalized weakness     Vision Baseline Vision/History: 0 No visual deficits Vision Assessment?: No apparent visual deficits     Perception Perception Perception Tested?: No   Praxis Praxis Praxis tested?: Not tested    Pertinent Vitals/Pain Pain  Assessment Pain Assessment: Faces Faces Pain Scale: Hurts little more Pain Location: LLE Pain Descriptors / Indicators: Discomfort Pain Intervention(s): Limited activity within patient's tolerance, Monitored during session      Hand Dominance Right   Extremity/Trunk Assessment Upper Extremity Assessment Upper Extremity Assessment: Overall WFL for tasks assessed   Lower Extremity Assessment Lower Extremity Assessment: Defer to PT evaluation   Cervical / Trunk Assessment Cervical / Trunk Assessment: Normal   Communication Communication Communication: No difficulties   Cognition Arousal/Alertness: Awake/alert Behavior During Therapy: WFL for tasks assessed/performed Overall Cognitive Status: Within Functional Limits for tasks assessed                                       General Comments  VSS on RA    Exercises     Shoulder Instructions      Home Living Family/patient expects to be discharged to:: Private residence Living Arrangements: Other relatives (brother) Available Help at Discharge: Family;Available PRN/intermittently Type of Home: Apartment Home Access: Level entry     Home Layout: One level     Bathroom Shower/Tub: Teacher, early years/pre: Standard Bathroom Accessibility: Yes How Accessible: Accessible via wheelchair Home Equipment: Rolling Walker (2 wheels);Rollator (4 wheels);BSC/3in1;Tub bench;Wheelchair - manual   Additional Comments: brother works 5a-2p      Prior Functioning/Environment Prior Level of Function : Needs assist             Mobility Comments: mostly uses WC in the home, rollator outside of the home ADLs Comments: has PCA 3x a week to assist with ADLs if needed        OT Problem List: Decreased strength;Decreased range of motion;Decreased activity tolerance;Impaired balance (sitting and/or standing);Decreased safety awareness;Decreased knowledge of use of DME or AE;Decreased knowledge of precautions      OT Treatment/Interventions: Self-care/ADL training;Therapeutic exercise;Energy conservation;DME and/or AE instruction;Therapeutic activities;Patient/family education;Balance training    OT Goals(Current goals can be  found in the care plan section) Acute Rehab OT Goals Patient Stated Goal: to feel better OT Goal Formulation: With patient Time For Goal Achievement: 04/08/22 Potential to Achieve Goals: Good ADL Goals Pt Will Perform Grooming: standing;with modified independence Pt Will Perform Lower Body Dressing: sit to/from stand;with modified independence Pt Will Transfer to Toilet: ambulating;with modified independence Additional ADL Goal #1: Pt will indep recall at least 3 energy conservation strategies to apply at discharge  OT Frequency: Min 2X/week    Co-evaluation              AM-PAC OT "6 Clicks" Daily Activity     Outcome Measure Help from another person eating meals?: None Help from another person taking care of personal grooming?: A Little Help from another person toileting, which includes using toliet, bedpan, or urinal?: A Little Help from another person bathing (including washing, rinsing, drying)?: A Little Help from another person to put on and taking off regular upper body clothing?: A Little Help from another person to put on and taking off regular lower body clothing?: A Little 6 Click Score: 19   End of Session Equipment Utilized During Treatment: Gait belt;Rolling walker (2 wheels) Nurse Communication: Mobility status  Activity Tolerance: Patient tolerated treatment well Patient left: in chair;with call bell/phone within reach  OT Visit Diagnosis: Unsteadiness on feet (R26.81);Other abnormalities of gait and mobility (R26.89);Muscle weakness (generalized) (M62.81);History of falling (Z91.81);Pain  Time: 1130-1155 OT Time Calculation (min): 25 min Charges:  OT General Charges $OT Visit: 1 Visit OT Evaluation $OT Eval Moderate Complexity: 1 Mod OT Treatments $Therapeutic Activity: 8-22 mins  Shade Flood, OTR/L Acute Rehabilitation Services Office [phone] Secure Chat Communication Preferred   Elliot Cousin 03/25/2022, 12:52 PM

## 2022-03-25 NOTE — Progress Notes (Signed)
PROGRESS NOTE    Ryan Jenkins  [id number] DOB: [birth year] DOA: 03/21/2022 PCP: [name], MD  Chief Complaint  Patient presents with   Dysuria    Brief Narrative:   Ryan Jenkins is Ryan Jenkins 62 y.o. male with medical history significant for HFpEF, CKD 4, type 2 diabetes, history of ESBL Klebsiella UTI who initially presented to [facility] ED with complaints of cloudy urine, foul-smelling urine for the past few weeks.  Associated with nausea and vomiting x 1 today, intermittent nonproductive cough, and pleuritic pain worse with coughing.   In the ED, noted to be hypoxic with O2 saturation of 88% with concern for mild pulmonary edema seen on chest x-ray.  Hypoxia improved on 3 L nasal cannula to maintain O2 saturation greater than 92%.     Workup revealed UA positive for pyuria.  Was started on empiric IV antibiotics Rocephin.  Workup also revealed AKI on CKD 4 with creatinine of 6.12 from baseline creatinine of 4.6.  BNP elevated greater than 1100 with mild cardiomegaly, mild vascular congestion on chest x-ray, personally reviewed.  He received 1 dose of IV Lasix 40 mg x 1 in the ED.  TRH was asked to admit.  The patient was accepted by Dr. Myna Hidalgo, [facility].  He was transferred to [facility] telemetry cardiac unit as inpatient status.    ED Course: Tmax 99.7.  BP 194/85, pulse 107, respiratory rate 20, O2 saturation 96%-100% on 3 L.  Lab studies remarkable for BNP eleven 1113.1.  Troponin 60, 59.  BUN 64, creatinine 6.12.  Serum bicarb 17.  Hemoglobin 9.4.  Assessment & Plan:   Principal Problem:   Acute respiratory failure with hypoxia (HCC)  Chest Pain Elevated troponin Elevated BNP Reported overnight, troponin stable I think his chest pain is MSK as able to reproduce with palpation D dimer is elevated, low suspicion for PE as history not consistent with this, but will follow LE [name], consider VQ scan (though I think with lack for ongoing CP, resp symptoms, this is  unlikely) Follow LE [name] Elevated troponin likely related to CKD Had Yaneli Keithley recent echocardiogram from 12/23/2021, showing LVEF 55 to 60%, with grade 2 diastolic dysfunction. Monitor strict I's and O's and daily weight  Reported Fever  ESBL Klebsiella UTI Negative covid, RSV, influenza Negative RVP Abx for klebsiella UTI, follow   Acute hypoxic respiratory failure  Cough  Pleuritic Chest Pain Concern for infectious etiology vs volume in the setting of his renal disease/HF Currently requiring 3 L by [place] Follow RVP as noted above S/p IV lasix x1 dose Will resume home lasix dose and follow  Low threshold for renal consult Strict I/O, daily weights CXR 3/8 with improved vascular congestion/pulmonary edema CT chest 3/9 without acute findings in chest, does have soft tissue edema, continue diuresis  AKI on CKD 4 Creatinine on presentation 6.12 from baseline creatinine of 4.6 Fluctuating, follow on oral home lasix  Avoid nephrotoxic agents and hypotension. Closely monitor urine output with strict I's and O's.   Pyuria History of ESBL Klebsiella UTI Follow urine culture - with klebsiella Continue abx as above   Non anion gap metabolic acidosis Related to CKD   Type 2 diabetes with hyperglycemia A1c 8.4 09/2021 SSI  A1c 7.4   Anemia of chronic disease in the setting of advanced CKD. Mild downtrend today, follow   Shoulder Pain Degenerative changes of AC joint    DVT prophylaxis: heparin Code Status: full Family Communication: none Disposition:  Status is: Inpatient Remains inpatient appropriate because: pending further improvement   Consultants:  none  Procedures:  none  Antimicrobials:  Anti-infectives (From admission, onward)    Start     Dose/Rate Route Frequency Ordered Stop   03/23/22 1045  meropenem (MERREM) 500 mg in sodium chloride 0.9 % 100 mL IVPB        500 mg 200 mL/hr over 30 Minutes Intravenous Every 12 hours 03/23/22 0958     03/22/22 1800   cefTRIAXone (ROCEPHIN) 2 g in sodium chloride 0.9 % 100 mL IVPB  Status:  Discontinued        2 g 200 mL/hr over 30 Minutes Intravenous Every 24 hours 03/22/22 0300 03/22/22 0441   03/22/22 0600  meropenem (MERREM) 500 mg in sodium chloride 0.9 % 100 mL IVPB  Status:  Discontinued        500 mg 200 mL/hr over 30 Minutes Intravenous Every 12 hours 03/22/22 0447 03/22/22 1530   03/21/22 1945  cefTRIAXone (ROCEPHIN) 2 g in sodium chloride 0.9 % 100 mL IVPB        2 g 200 mL/hr over 30 Minutes Intravenous  Once 03/21/22 1936 03/21/22 2059       Subjective: No complaints today  Objective: Vitals:   03/24/22 0343 03/24/22 0845 03/24/22 1151 03/24/22 1553  BP: (!) 159/77 (!) 170/84 (!) 164/87 (!) 150/86  Pulse: 84 89 96 79  Resp: 13 17 (!) 21 13  Temp: 99.6 F (37.6 C) 98.7 F (37.1 C) 98.2 F (36.8 C) 98.5 F (36.9 C)  TempSrc: Oral Oral Oral Oral  SpO2: 98% 97% 99% 96%  Weight: 98.2 kg     Height:        Intake/Output Summary (Last 24 hours) at 03/24/2022 1731 Last data filed at 03/24/2022 1300 Gross per 24 hour  Intake 200 ml  Output 1050 ml  Net -850 ml   Filed Weights   03/22/22 0351 03/23/22 0540 03/24/22 0343  Weight: 97.2 kg 98.5 kg 98.2 kg    Examination:  General: No acute distress. Cardiovascular: RRR Lungs: unlabored Abdomen: Soft, nontender, nondistended Neurological: Alert and oriented 3. Moves all extremities 4 with equal strength. Cranial nerves II through XII grossly intact. Extremities: No clubbing or cyanosis. No edema.  Data Reviewed: I have personally reviewed following labs and imaging studies  CBC: Recent Labs  Lab 03/21/22 1745 03/22/22 0316 03/23/22 0228 03/24/22 0951  WBC 10.2 8.2 5.9 6.2  NEUTROABS  --   --   --  4.1  HGB 9.6* 9.4* 8.5* 9.7*  HCT 28.6* 26.9* 25.6* 29.1*  MCV 89.7 88.8 90.8 89.8  PLT 160 245 251 [id number]    Basic Metabolic Panel: Recent Labs  Lab 03/21/22 1745 03/22/22 0316 03/23/22 0228 03/24/22 0951  NA 138  138 139 138  K 4.6 4.2 4.1 3.9  CL 109 108 108 106  CO2 17* 16* 18* 18*  GLUCOSE 169* 160* 100* 117*  BUN 64* 63* 66* 61*  CREATININE 6.12* 5.91* 5.78* 5.81*  CALCIUM 8.5* 8.3* 8.4* 8.7*  MG  --  1.7  --   --   PHOS  --  4.4 5.3*  --     GFR: Estimated Creatinine Clearance: 15.5 mL/min (Scottie Metayer) (by C-G formula based on SCr of 5.81 mg/dL (H)).  Liver Function Tests: Recent Labs  Lab 03/21/22 1745 03/23/22 0228 03/24/22 0951  AST 14*  --  14*  ALT 7  --  9  ALKPHOS 67  --  61  BILITOT 0.6  --  0.6  PROT 7.7  --  7.5  ALBUMIN 3.8 2.7* 3.2*    CBG: Recent Labs  Lab 03/23/22 1643 03/23/22 2025 03/24/22 0842 03/24/22 1150 03/24/22 1552  GLUCAP 164* 147* 111* 137* 103*     Recent Results (from the past 240 hour(s))  Urine Culture (for pregnant, neutropenic or urologic patients or patients with an indwelling urinary catheter)     Status: Abnormal   Collection Time: 03/21/22  7:22 PM   Specimen: Urine, Clean Catch  Result Value Ref Range Status   Specimen Description URINE, CLEAN CATCH  Final   Special Requests   Final    NONE Performed at [facility], [place] [address], [place], [place] [address]    Culture (Burney Calzadilla)  Final    >=100,000 COLONIES/mL KLEBSIELLA PNEUMONIAE Confirmed Extended Spectrum Beta-Lactamase Producer (ESBL).  In bloodstream infections from ESBL organisms, carbapenems are preferred over piperacillin/tazobactam. They are shown to have Cranston Koors lower risk of mortality.    Report Status 03/24/2022 FINAL  Final   Organism ID, Bacteria KLEBSIELLA PNEUMONIAE (Toron Bowring)  Final      Susceptibility   Klebsiella pneumoniae - MIC*    AMPICILLIN >=32 RESISTANT Resistant     CEFAZOLIN >=64 RESISTANT Resistant     CEFEPIME >=32 RESISTANT Resistant     CEFTRIAXONE >=64 RESISTANT Resistant     CIPROFLOXACIN >=4 RESISTANT Resistant     GENTAMICIN <=1 SENSITIVE Sensitive     IMIPENEM 0.5 SENSITIVE Sensitive     NITROFURANTOIN 64 INTERMEDIATE Intermediate     TRIMETH/SULFA  >=320 RESISTANT Resistant     AMPICILLIN/SULBACTAM >=32 RESISTANT Resistant     PIP/TAZO 8 SENSITIVE Sensitive     * >=100,000 COLONIES/mL KLEBSIELLA PNEUMONIAE  Resp panel by RT-PCR (RSV, Flu Shenice Dolder&B, Covid) Anterior Nasal Swab     Status: None   Collection Time: 03/22/22 11:37 AM   Specimen: Anterior Nasal Swab  Result Value Ref Range Status   SARS Coronavirus 2 by RT PCR NEGATIVE NEGATIVE Final   Influenza Hendrick Pavich by PCR NEGATIVE NEGATIVE Final   Influenza B by PCR NEGATIVE NEGATIVE Final    Comment: (NOTE) The Xpert Xpress SARS-CoV-2/FLU/RSV plus assay is intended as an aid in the diagnosis of influenza from Nasopharyngeal swab specimens and should not be used as Marisel Tostenson sole basis for treatment. Nasal washings and aspirates are unacceptable for Xpert Xpress SARS-CoV-2/FLU/RSV testing.  Fact Sheet for Patients: EntrepreneurPulse.com.au  Fact Sheet for Healthcare Providers: IncredibleEmployment.be  This test is not yet approved or cleared by the [place] FDA and has been authorized for detection and/or diagnosis of SARS-CoV-2 by FDA under an Emergency Use Authorization (EUA). This EUA will remain in effect (meaning this test can be used) for the duration of the COVID-19 declaration under Section 564(b)(1) of the Act, 21 U.S.C. section 360bbb-3(b)(1), unless the authorization is terminated or revoked.     Resp Syncytial Virus by PCR NEGATIVE NEGATIVE Final    Comment: (NOTE) Fact Sheet for Patients: EntrepreneurPulse.com.au  Fact Sheet for Healthcare Providers: IncredibleEmployment.be  This test is not yet approved or cleared by the [place] FDA and has been authorized for detection and/or diagnosis of SARS-CoV-2 by FDA under an Emergency Use Authorization (EUA). This EUA will remain in effect (meaning this test can be used) for the duration of the COVID-19 declaration under Section 564(b)(1) of the Act, 21  U.S.C. section 360bbb-3(b)(1), unless the authorization is terminated or revoked.  Performed at [facility], [place]  [address], [place], [place] [address]   Respiratory (~20 pathogens) panel by PCR     Status: None   Collection Time: 03/22/22  4:00 PM   Specimen: Nasopharyngeal Swab; Respiratory  Result Value Ref Range Status   Adenovirus NOT DETECTED NOT DETECTED Final   Coronavirus 229E NOT DETECTED NOT DETECTED Final    Comment: (NOTE) The Coronavirus on the Respiratory Panel, DOES NOT test for the novel  Coronavirus (2019 nCoV)    Coronavirus HKU1 NOT DETECTED NOT DETECTED Final   Coronavirus NL63 NOT DETECTED NOT DETECTED Final   Coronavirus OC43 NOT DETECTED NOT DETECTED Final   Metapneumovirus NOT DETECTED NOT DETECTED Final   Rhinovirus / Enterovirus NOT DETECTED NOT DETECTED Final   Influenza Ashleynicole Mcclees NOT DETECTED NOT DETECTED Final   Influenza B NOT DETECTED NOT DETECTED Final   Parainfluenza Virus 1 NOT DETECTED NOT DETECTED Final   Parainfluenza Virus 2 NOT DETECTED NOT DETECTED Final   Parainfluenza Virus 3 NOT DETECTED NOT DETECTED Final   Parainfluenza Virus 4 NOT DETECTED NOT DETECTED Final   Respiratory Syncytial Virus NOT DETECTED NOT DETECTED Final   Bordetella pertussis NOT DETECTED NOT DETECTED Final   Bordetella Parapertussis NOT DETECTED NOT DETECTED Final   Chlamydophila pneumoniae NOT DETECTED NOT DETECTED Final   Mycoplasma pneumoniae NOT DETECTED NOT DETECTED Final    Comment: Performed at [facility], [place]. [address] [place],  [address]         Radiology Studies: CT CHEST WO CONTRAST  Result Date: 03/24/2022 CLINICAL DATA:  Chest pain EXAM: CT CHEST WITHOUT CONTRAST TECHNIQUE: Multidetector CT imaging of the chest was performed following the standard protocol without IV contrast. RADIATION DOSE REDUCTION: This exam was performed according to the departmental dose-optimization program which includes automated exposure control, adjustment  of the mA and/or kV according to patient size and/or use of iterative reconstruction technique. COMPARISON:  01/24/2022 FINDINGS: Cardiovascular: The heart size is normal. Trace pericardial effusion, stable. Coronary artery calcification is evident. Mild atherosclerotic calcification is noted in the wall of the thoracic aorta. Mediastinum/Nodes: Increased number of small mediastinal lymph nodes are similar to prior with no individual node meeting CT size criteria for pathologic enlargement. No evidence for gross hilar lymphadenopathy although assessment is limited by the lack of intravenous contrast on the current study. The esophagus has normal imaging features. There is no axillary lymphadenopathy. Lungs/Pleura: No focal airspace consolidation. No suspicious pulmonary nodule or mass. Thickening of the left major fissure with 5 mm nodular component on 66/4, likely subpleural lymph node. No pulmonary edema or pleural effusion. Upper Abdomen: Unremarkable. Musculoskeletal: No worrisome lytic or sclerotic osseous abnormality. Dependent subcutaneous edema and skin thickening is similar to prior. IMPRESSION: 1. No acute findings in the chest. Specifically, no findings to explain the patient's history of chest pain. 2. Similar dependent subcutaneous edema and skin thickening suggests soft tissue edema. 3.  Aortic Atherosclerosis (ICD10-I70.0). Electronically Signed   By: Misty Stanley M.D.   On: 03/24/2022 15:58   DG CHEST PORT 1 VIEW  Result Date: 03/24/2022 CLINICAL DATA:  Chest pain EXAM: PORTABLE CHEST 1 VIEW COMPARISON:  Three hundred twenty-four FINDINGS: Single frontal view of the chest demonstrates an unremarkable cardiac silhouette. No airspace disease, effusion, or pneumothorax. No acute bony abnormalities. IMPRESSION: 1. No acute intrathoracic process. Electronically Signed   By: Randa Ngo M.D.   On: 03/24/2022 10:12   DG CHEST PORT 1 VIEW  Result Date: 03/23/2022 CLINICAL DATA:  Shortness of breath  EXAM:  PORTABLE CHEST 1 VIEW COMPARISON:  Chest radiograph 2 days prior FINDINGS: Cardiomegaly is unchanged. The upper mediastinal contours are stable. Previously seen vascular congestion/pulmonary edema has improved. There is no new or worsening focal airspace disease. There is no pleural effusion. There is no pneumothorax There is no acute osseous abnormality. IMPRESSION: Improved vascular congestion/pulmonary edema. No new or worsening focal airspace disease. Electronically Signed   By: Valetta Mole M.D.   On: 03/23/2022 08:17        Scheduled Meds:  amLODipine  10 mg Oral Daily   famotidine  20 mg Oral Daily   furosemide  80 mg Oral BID   heparin  5,000 Units Subcutaneous Q8H   insulin aspart  0-5 Units Subcutaneous QHS   insulin aspart  0-9 Units Subcutaneous TID WC   lidocaine  1 patch Transdermal Q24H   rosuvastatin  10 mg Oral Daily   sodium bicarbonate  1,300 mg Oral BID   tamsulosin  0.4 mg Oral Daily   Continuous Infusions:  meropenem (MERREM) IV 500 mg (03/24/22 0856)     LOS: 2 days    Time spent: over 30 min    Fayrene Helper, MD [facility]   To contact the attending provider between 7A-7P or the covering provider during after hours 7P-7A, please log into the web site [email or web address] and access using universal [place] password for that web site. If you do not have the password, please call the hospital operator.  03/24/2022, 5:31 PM

## 2022-03-25 NOTE — Evaluation (Signed)
Physical Therapy Evaluation Patient Details Name: Ryan Jenkins MRN: [id number] DOB: Sep 22, 1960 Today's Date: 03/25/2022  History of Present Illness  Pt is 62 yo male who presented on 03/21/22 with UTI symptoms but also found to have pulmonary edema with resp failure and pleuritic chest pain.  Pt with elevated troponin but per MD note likely muscuolskeletal.  Pt with hx including but not limited to HF, CKD, DM2  Clinical Impression  Pt admitted with above diagnosis. At baseline, pt resides with brother.  He is ambulatory with rollator but does have assist from aide for ADLs at times. He was getting HHPT.  Today, pt was min guard for transfers and to ambulate 64' with rollator.  His VS were stable on RA with O2 sats 96% with rest and activity.  Pt reports pain in L low back that radiates down posterior thigh all the way to his foot .  He did have some relieve and decreased peripheralization on pain with repeated extension in sitting and standing.  Advised HEP repeated ext in sitting and could try prone lying if able.  Discussed could look into outpt PT for back pain after HHPT if still needed.  Pt expected to progress well. Pt currently with functional limitations due to the deficits listed below (see PT Problem List). Pt will benefit from skilled PT to increase their independence and safety with mobility to allow discharge to the venue listed below.          Recommendations for follow up therapy are one component of a multi-disciplinary discharge planning process, led by the attending physician.  Recommendations may be updated based on patient status, additional functional criteria and insurance authorization.  Follow Up Recommendations Home health PT (could consider outpt PT for back/leg pain after HHPT)      Assistance Recommended at Discharge Intermittent Supervision/Assistance  Patient can return home with the following  A little help with walking and/or transfers;A little help with  bathing/dressing/bathroom;Assistance with cooking/housework;Help with stairs or ramp for entrance    Equipment Recommendations None recommended by PT  Recommendations for Other Services       Functional Status Assessment Patient has had a recent decline in their functional status and demonstrates the ability to make significant improvements in function in a reasonable and predictable amount of time.     Precautions / Restrictions Precautions Precautions: Fall      Mobility  Bed Mobility Overal bed mobility: Needs Assistance Bed Mobility: Supine to Sit, Sit to Supine     Supine to sit: Supervision Sit to supine: Supervision   General bed mobility comments: increased time; pt reports long hx of vertigo but did not experience with this transfers    Transfers Overall transfer level: Needs assistance Equipment used: Rolling walker (2 wheels) Transfers: Sit to/from Stand Sit to Stand: Supervision           General transfer comment: increased time but no assist; performed x 2    Ambulation/Gait Ambulation/Gait assistance: Min guard Gait Distance (Feet): 75 Feet Assistive device: Rolling walker (2 wheels) Gait Pattern/deviations: Step-through pattern, Decreased stride length       General Gait Details: min cues for posture; min g for safety  Stairs            Wheelchair Mobility    Modified Rankin (Stroke Patients Only)       Balance Overall balance assessment: Needs assistance Sitting-balance support: Feet supported Sitting balance-Leahy Scale: Good     Standing balance support: Bilateral upper extremity  supported, No upper extremity supported Standing balance-Leahy Scale: Fair Standing balance comment: RW to ambulate but could static stand without support                             Pertinent Vitals/Pain Pain Assessment Pain Assessment: 0-10 Pain Score: 6  Pain Location: LLE (L low back shooting down posterior L leg hip to  foot) Pain Descriptors / Indicators: Shooting Pain Intervention(s): Limited activity within patient's tolerance, Monitored during session, Repositioned, Relaxation, Other (comment) (repeated extension)    Home Living Family/patient expects to be discharged to:: Private residence Living Arrangements: Other relatives (brother) Available Help at Discharge: Family;Available PRN/intermittently Type of Home: Apartment Home Access: Level entry       Home Layout: One level Home Equipment: Conservation officer, nature (2 wheels);Rollator (4 wheels);BSC/3in1;Tub bench;Wheelchair - manual (does report rollator and w/c brakes are not locking well) Additional Comments: brother works 5a-2p    Prior Function Prior Level of Function : Needs assist             Mobility Comments: mostly uses WC in the home, rollator outside of the home; could ambulate in community but fatigues; was getting HHPT ADLs Comments: has PCA 3x a week to assist with ADLs if needed     Hand Dominance   Dominant Hand: Right    Extremity/Trunk Assessment   Upper Extremity Assessment Upper Extremity Assessment: Overall WFL for tasks assessed    Lower Extremity Assessment Lower Extremity Assessment: LLE deficits/detail;RLE deficits/detail RLE Deficits / Details: ROM WFL; MMT 5/5 RLE Sensation: history of peripheral neuropathy LLE Deficits / Details: ROM WFL; MMT at least 3/5 but not further tested due to pain LLE Sensation: history of peripheral neuropathy    Cervical / Trunk Assessment Cervical / Trunk Assessment: Kyphotic;Other exceptions Cervical / Trunk Exceptions: mild kyphosis/rounded shoulder/forward head  Communication   Communication: No difficulties  Cognition Arousal/Alertness: Awake/alert Behavior During Therapy: WFL for tasks assessed/performed Overall Cognitive Status: Within Functional Limits for tasks assessed                                          General Comments  O2 sat: Pt on RA  with sats 96% rest and activity.   Noted pt with order for U/S LE to r/o DVT ordered yesterday around 1330 that has not been performed.  Pt on heparin, no LE edema, no specific calf pain (has pain that radiates entire L LE relieved with repeated ext), no chest pain, Homan negative, and has been mobilizing in room today.  Held LE exercise and MMT but proceeded with eval due to above reasons.   Pt with pain in L leg.  Describes as starts in low back and radiates down posterior thigh,calf, to foot.  States present for about 1 month.  Pt reports no aggravating or relieving factors. Pt with hx of peripheral neuropathy.  Had pt perform repeated lumbar ext in standing and reports decreased pain in leg but then hip started hurting so he sat.  Had pt perfrom repeated lumbar ext in sitting with good improvement in leg pain and no hip pain.  Advised continuing repeated ext in sitting and discussed could try prone position if able.       Exercises Other Exercises Other Exercises: Repeated lumbar ext x 5 in standing and x 5 in sitting with decreased peripheralization  of pain; encouraged to perform in sitting on his own and could try prone position if able.  Educated on peripheralization vs centralization and to stop exercises if pain down legs increase.   Assessment/Plan    PT Assessment Patient needs continued PT services  PT Problem List Decreased strength;Cardiopulmonary status limiting activity;Pain;Decreased range of motion;Decreased activity tolerance;Decreased balance;Decreased mobility;Decreased knowledge of precautions;Decreased knowledge of use of DME;Decreased safety awareness       PT Treatment Interventions DME instruction;Therapeutic exercise;Gait training;Balance training;Stair training;Functional mobility training;Therapeutic activities;Patient/family education;Modalities;Neuromuscular re-education    PT Goals (Current goals can be found in the Care Plan section)  Acute Rehab PT  Goals Patient Stated Goal: decrease pain; return home PT Goal Formulation: With patient Time For Goal Achievement: 04/08/22 Potential to Achieve Goals: Good    Frequency Min 3X/week     Co-evaluation               AM-PAC PT "6 Clicks" Mobility  Outcome Measure Help needed turning from your back to your side while in a flat bed without using bedrails?: A Little Help needed moving from lying on your back to sitting on the side of a flat bed without using bedrails?: A Little Help needed moving to and from a bed to a chair (including a wheelchair)?: A Little Help needed standing up from a chair using your arms (e.g., wheelchair or bedside chair)?: A Little Help needed to walk in hospital room?: A Little Help needed climbing 3-5 steps with a railing? : A Little 6 Click Score: 18    End of Session Equipment Utilized During Treatment: Gait belt Activity Tolerance: Patient tolerated treatment well Patient left: in bed;with call bell/phone within reach;with bed alarm set Nurse Communication: Mobility status PT Visit Diagnosis: Other abnormalities of gait and mobility (R26.89);Muscle weakness (generalized) (M62.81)    Time: [id number] PT Time Calculation (min) (ACUTE ONLY): 27 min   Charges:   PT Evaluation $PT Eval Moderate Complexity: 1 Mod PT Treatments $Therapeutic Exercise: 8-22 mins        Abran Richard, PT Acute Rehab [facility] [phone]   Karlton Lemon 03/25/2022, 4:45 PM

## 2022-03-25 NOTE — Progress Notes (Signed)
Pharmacy Antibiotic Note-Follow Up  Ryan Jenkins is a 62 y.o. male admitted on 03/21/2022 with UTI. PMH history includes HFpEF, CKD 4, T2DM, and hx of ESBL Kleb UTI.  Pharmacy has been consulted for Meropenem dosing.Culture has grown >10k Kleb pneumo S-imipenem. sCr downtrending 5.8>5.7 (bl~4-5), WBC 6s, afebrile. Has received 2 full days of therapy with meropenem  Plan: Continue meropenem 500 mg IV q12h Monitor signs of clinical improvement, fever curve, WBC, and cultural data F/u stop date for antibiotics  Height: '6\' 2"'$  (188 cm) Weight: 90.3 kg (199 lb 1.2 oz) IBW/kg (Calculated) : 82.2  Temp (24hrs), Avg:98.5 F (36.9 C), Min:98.2 F (36.8 C), Max:98.8 F (37.1 C)  Recent Labs  Lab 03/21/22 1745 03/22/22 0316 03/23/22 0228 03/24/22 0951 03/25/22 0314  WBC 10.2 8.2 5.9 6.2 6.3  CREATININE 6.12* 5.91* 5.78* 5.81* 5.70*    Estimated Creatinine Clearance: 15.8 mL/min (A) (by C-G formula based on SCr of 5.7 mg/dL (H)).    Allergies  Allergen Reactions   Claritin [Loratadine] Swelling    Joint swelling    Hydrochlorothiazide Other (See Comments)    Dizziness   Latex Hives and Swelling   Lyrica [Pregabalin] Other (See Comments)    Depression. "Makes me loopy"    Metformin And Related Diarrhea and Nausea And Vomiting   Antibiotics CTX 3/6>>3/6 Meropenem 3/7>>  Culture Data Ucx: ESBL kleb pneumo: S-imipenem  Sandford Craze, PharmD. [facility] Acute [facility] PGY-1  03/25/2022 9:44 AM

## 2022-03-26 ENCOUNTER — Inpatient Hospital Stay (HOSPITAL_COMMUNITY): Payer: Medicaid Other

## 2022-03-26 DIAGNOSIS — J9601 Acute respiratory failure with hypoxia: Secondary | ICD-10-CM | POA: Diagnosis not present

## 2022-03-26 DIAGNOSIS — R609 Edema, unspecified: Secondary | ICD-10-CM | POA: Diagnosis not present

## 2022-03-26 LAB — BASIC METABOLIC PANEL
Anion gap: 12 (ref 5–15)
BUN: 69 mg/dL — ABNORMAL HIGH (ref 8–23)
CO2: 19 mmol/L — ABNORMAL LOW (ref 22–32)
Calcium: 8.6 mg/dL — ABNORMAL LOW (ref 8.9–10.3)
Chloride: 105 mmol/L (ref 98–111)
Creatinine, Ser: 5.82 mg/dL — ABNORMAL HIGH (ref 0.61–1.24)
GFR, Estimated: 10 mL/min — ABNORMAL LOW (ref >60)
Glucose, Bld: 87 mg/dL (ref 70–99)
Potassium: 3.9 mmol/L (ref 3.5–5.1)
Sodium: 136 mmol/L (ref 135–145)

## 2022-03-26 LAB — GLUCOSE, CAPILLARY
Glucose-Capillary: 76 mg/dL (ref 70–99)
Glucose-Capillary: 108 mg/dL — ABNORMAL HIGH (ref 70–99)
Glucose-Capillary: 101 mg/dL — ABNORMAL HIGH (ref 70–99)
Glucose-Capillary: 177 mg/dL — ABNORMAL HIGH (ref 70–99)

## 2022-03-26 LAB — CBC
HCT: 27.5 % — ABNORMAL LOW (ref 39.0–52.0)
Hemoglobin: 9.4 g/dL — ABNORMAL LOW (ref 13.0–17.0)
MCH: 30.2 pg (ref 26.0–34.0)
MCHC: 34.2 g/dL (ref 30.0–36.0)
MCV: 88.4 fL (ref 80.0–100.0)
Platelets: 324 10*3/uL (ref 150–400)
RBC: 3.11 MIL/uL — ABNORMAL LOW (ref 4.22–5.81)
RDW: 13.6 % (ref 11.5–15.5)
WBC: 5.8 10*3/uL (ref 4.0–10.5)
nRBC: 0 % (ref 0.0–0.2)

## 2022-03-26 LAB — PHOSPHORUS: Phosphorus: 6.4 mg/dL — ABNORMAL HIGH (ref 2.5–4.6)

## 2022-03-26 LAB — MAGNESIUM: Magnesium: 1.8 mg/dL (ref 1.7–2.4)

## 2022-03-26 MED ORDER — FINASTERIDE 5 MG PO TABS
5.0000 mg | ORAL_TABLET | Freq: Every day | ORAL | Status: DC
  Administered 2022-03-26 – 2022-03-29 (×4): 5 mg via ORAL
  Filled 2022-03-26 (×4): qty 1

## 2022-03-26 MED ORDER — METOPROLOL SUCCINATE ER 25 MG PO TB24
12.5000 mg | ORAL_TABLET | Freq: Every day | ORAL | Status: DC
  Administered 2022-03-26 – 2022-03-29 (×4): 12.5 mg via ORAL
  Filled 2022-03-26 (×4): qty 1

## 2022-03-26 MED ORDER — HYDROMORPHONE HCL 1 MG/ML IJ SOLN: 0.5000 mg | Freq: Four times a day (QID) | INTRAMUSCULAR | Status: DC | PRN

## 2022-03-26 MED ORDER — ACETAMINOPHEN 500 MG PO TABS
1000.0000 mg | ORAL_TABLET | Freq: Three times a day (TID) | ORAL | Status: DC
  Administered 2022-03-26 – 2022-03-29 (×8): 1000 mg via ORAL
  Filled 2022-03-26 (×9): qty 2

## 2022-03-26 MED ORDER — CLOPIDOGREL BISULFATE 75 MG PO TABS
75.0000 mg | ORAL_TABLET | Freq: Every day | ORAL | Status: DC
  Administered 2022-03-26 – 2022-03-29 (×4): 75 mg via ORAL
  Filled 2022-03-26 (×4): qty 1

## 2022-03-26 NOTE — Progress Notes (Signed)
VASCULAR LAB    Bilateral lower extremity venous dupelx has been performed.  See CV proc for preliminary results.   Candida Vetter, RVT 03/26/2022, 4:32 PM

## 2022-03-26 NOTE — Progress Notes (Signed)
PROGRESS NOTE  Ryan Jenkins  DOB: Nov 19, 1960  PCP: [name], MD [id number]  DOA: 03/21/2022  LOS: 4 days  Hospital Day: 6  Brief narrative: Ryan Jenkins is a 62 y.o. male with PMH significant for DM2, HTN, HLD, CAD, chronic combined systolic and diastolic CHF, CKD 4, diabetic neuropathy, GERD, chronic anemia, anxiety/depression, arthritis, BPH, vitamin B12 deficiency, impaired mobility. 3/6, patient presented to the ED with complaint of cloudy foul-smelling urine for few weeks, nausea, vomiting, intermittent nonproductive cough and pleuritic pain.  Also reported a fever of 102 prior to presentation. In the ED, patient was noted to have O2 sat low at 88%, required 2 L oxygen nasal cannula Chest x-ray showed mild pulm edema Urinalysis was positive for pyuria Labs with creatinine elevated to 6.12 from a baseline of 4.6, serum bicarb low at 17, hemoglobin low at 9.4 BNP greater than 1100, troponin elevated to 60 Patient was given 1 dose of IV Lasix 40 mg, started on IV antibiotic coverage with IV Rocephin Admitted to [facility]  Subjective: Patient was seen and examined this morning.  Pleasant middle-aged African-American male.  Looks older for his stated age.  He had just walked with mobility specialist prior to my visit.  He states that his primary complaint at this time is radicular pain in the left hip.  Does not feel comfortable going home today. Chart reviewed In the last 24 hours, remains afebrile, hemodynamically stable with blood pressure 150s this morning  Assessment and plan: UTI with ESBL Klebsiella pneumonia Presented with foul-smelling urine, nausea, vomiting.  Reported fever at home. Urine culture sent on admission grew more than 100,000 CFU per mL of Klebsiella pneumoniae On chart review, I noted that patient had grown ESBL Klebsiella in his previous cultures from November and December 2023.  In the absence of symptoms, he was not treated at that time.  I would  think that patient's bladder is colonized with this bacteria. However, this admission, patient had true symptoms of UTI and hence he was started on IV meropenem. Currently on IV meropenem Recent Labs  Lab 03/22/22 0316 03/23/22 0228 03/24/22 0951 03/25/22 0314 03/26/22 0146  WBC 8.2 5.9 6.2 6.3 5.8   Acute exacerbation of chronic combined systolic and diastolic CHF On presentation, had cough, shortness of breath, pleuritic chest pain,  Chest x-ray with mild pulm edema, labs with elevated BNP, elevated troponin Last echo from December 2023 with EF 50 to 55%, global hypokinesis, moderate LVH, grade 2 diastolic dysfunction, mildly enlarged right ventricular size and mildly reduced right ventricular function Reports compliance to all his cardiac meds prior to presentation.  He was on Toprol 50 mg daily, amlodipine 10 mg daily and Lasix 80 mg twice daily. Patient was started on IV Lasix and later switched to oral Lasix Net IO Since Admission: -4,519.33 mL [03/26/22 1420] Repeat imagings on 3/8 and 3/9 showed improvement in pulm edema. Currently on Lasix 80 mg twice daily, amlodipine 10 mg daily.  Blood pressure and heart rate stable.  I would also resume metoprolol at a low dose of 12.5 mg daily. Continue to monitor for daily intake output, weight, blood pressure, BNP, renal function and electrolytes. 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  Presented with creatinine elevated to 6.12, gradually improved, seems to be holding close to 5.8 for last 48 hours.  Probably new baseline. Continue sodium bicarb 650 mg twice daily Follows up with [facility] as an  outpatient Recent Labs    12/29/21 0412 12/30/21 0527 01/24/22 1527 01/25/22 0500 03/21/22 1745 03/22/22 0316 03/23/22 0228 03/24/22 0951 03/25/22 0314 03/26/22 0146  BUN 61* 54* 51* 54* 64* 63* 66* 61* 62* 69*  CREATININE 4.05* 3.74* 4.49* 4.63* 6.12* 5.91* 5.78* 5.81* 5.70* 5.82*  CO2 19* 20* 20* 22 17* 16* 18* 18* 19* 19*   Hypomagnesemia/hyperphosphatemia Magnesium level low.  On regular oral magnesium supplement Phosphate level elevated probably because of CKD. Recent Labs  Lab 03/22/22 0316 03/23/22 0228 03/24/22 0951 03/25/22 0314 03/26/22 0146  K 4.2 4.1 3.9 3.9 3.9  MG 1.7  --   --  1.6* 1.8  PHOS 4.4 5.3*  --  5.9* 6.4*   Acute respiratory failure with hypoxia Secondary to CHF exacerbation.   Initially required 3 L oxygen nasal cannula. Currently able to ambulate without oxygen.  Demand ischemia On presentation, reported chest pain.  Troponin was mildly elevated as well but stabilized. Symptoms and elevated troponin are likely due to CHF in the setting of severe CKD Chest pain was also reproducible on palpation. PTA on Plavix 75 mg daily, Crestor 10 mg daily Continue the same. Recent Labs    03/23/22 2325 03/24/22 0131  TROPONINIHS 55* 57*   Elevated D-dimer ultrasound duplex of lower extremity pending.  BPH Continue finasteride 5 mg daily and Flomax 0.4 mg daily   Type 2 diabetes mellitus A1c 7.4 on 03/22/2022 PTA on Lantus 30 units nightly.  He reports compliance to it. However his blood sugars currently controlled on sliding scale only.   Continue to monitor. Recent Labs  Lab 03/25/22 1222 03/25/22 1701 03/25/22 2136 03/26/22 0812 03/26/22 1239  GLUCAP 95 114* 97 76 108*   Anemia of chronic disease Hemoglobin low but stable. Recent Labs    06/09/21 0934 07/03/21 1110 03/22/22 0316 03/23/22 0228 03/24/22 0951 03/25/22 0314 03/26/22 0146  HGB  --    < > 9.4* 8.5* 9.7* 8.9* 9.4*  MCV  --    < > 88.8 90.8 89.8 89.0 88.4  FERRITIN  295  --   --   --   --   --   --   TIBC 190*  --   --   --   --   --   --   IRON 15*  --   --   --   --   --   --    < > = values in this interval not displayed.    Shoulder Pain Chronic arthritis Degenerative changes of AC joint Currently on pain control with as needed oxycodone  Peripheral neuropathy He was previously on Neurontin 300 mg 3 times daily which he states he is no longer taking.  CKD.    Persistent left lower extremity pain, left hip pain Reports severe pain this morning. Imagings reviewed.  He never had imaging of his back in the past. I ordered for MRI lumbar spine. Start pain regimen with scheduled Tylenol, as needed oxycodone as needed IV Dilaudid. Mobility: Home with PT recommended by PT  Goals of  care   Code Status: Full Code     DVT prophylaxis:  heparin injection 5,000 Units Start: 03/22/22 1400   Antimicrobials: IV meropenem Fluid: None Consultants: None Family Communication: None at bedside  Status: Inpatient Level of care:  Telemetry Cardiac   Needs to continue in-hospital care:  On IV meropenem.  Also being worked up for severe back pain  Prognosis:  Guarded prognosis, given severe CKD, CAD and CHF.  Patient from: Home.  Lives with his brother Anticipated d/c to: Hopefully home in 1 to 2 days     Scheduled Meds:  acetaminophen  1,000 mg Oral TID   amLODipine  10 mg Oral Daily   clopidogrel  75 mg Oral Daily   famotidine  20 mg Oral Daily   finasteride  5 mg Oral Daily   furosemide  80 mg Oral BID   heparin  5,000 Units Subcutaneous Q8H   insulin aspart  0-5 Units Subcutaneous QHS   insulin aspart  0-9 Units Subcutaneous TID WC   lidocaine  1 patch Transdermal Q24H   magnesium oxide  400 mg Oral Daily   metoprolol succinate  12.5 mg Oral Daily   rosuvastatin  10 mg Oral Daily   tamsulosin  0.4 mg Oral Daily    PRN meds: HYDROmorphone (DILAUDID) injection, melatonin, mouth rinse, oxyCODONE, polyethylene glycol, prochlorperazine    Infusions:   meropenem (MERREM) IV Stopped (03/26/22 1009)    Diet:  Diet Order             Diet heart healthy/carb modified Room service appropriate? Yes; Fluid consistency: Thin  Diet effective now                   Antimicrobials: Anti-infectives (From admission, onward)    Start     Dose/Rate Route Frequency Ordered Stop   03/23/22 1045  meropenem (MERREM) 500 mg in sodium chloride 0.9 % 100 mL IVPB        500 mg 200 mL/hr over 30 Minutes Intravenous Every 12 hours 03/23/22 0958     03/22/22 1800  cefTRIAXone (ROCEPHIN) 2 g in sodium chloride 0.9 % 100 mL IVPB  Status:  Discontinued        2 g 200 mL/hr over 30 Minutes Intravenous Every 24 hours 03/22/22 0300 03/22/22 0441   03/22/22 0600  meropenem (MERREM) 500 mg in sodium chloride 0.9 % 100 mL IVPB  Status:  Discontinued        500 mg 200 mL/hr over 30 Minutes Intravenous Every 12 hours 03/22/22 0447 03/22/22 1530   03/21/22 1945  cefTRIAXone (ROCEPHIN) 2 g in sodium chloride 0.9 % 100 mL IVPB        2 g 200 mL/hr over 30 Minutes Intravenous  Once 03/21/22 1936 03/21/22 2059       Skin assessment:       Nutritional status:  Body mass index is 27.1 kg/m.          Objective: Vitals:   03/26/22 1234 03/26/22 1300  BP: 137/81   Pulse: 83 79  Resp: 15 13  Temp: 98.1 F (36.7 C)   SpO2: 100% 98%    Intake/Output Summary (Last 24 hours) at 03/26/2022 1420 Last data filed at 03/26/2022 1100 Gross per 24 hour  Intake 590 ml  Output 950 ml  Net -360 ml   Filed Weights   03/24/22 0343 03/25/22 0510 03/26/22 0316  Weight: 98.2 kg 90.3 kg 95.8 kg   Weight change: 5.454 kg Body mass  index is 27.1 kg/m.   Physical Exam: General exam: Pleasant, middle-aged African-American male.  In moderate distress because of lower back pain and left lower extremity pain Skin: No rashes, lesions or ulcers. HEENT: Atraumatic, normocephalic, no obvious bleeding Lungs: Clear to auscultation bilaterally CVS:  Regular rate and rhythm, no murmur GI/Abd soft, nontender, nondistended, bowel sound present CNS: Alert, awake, oriented x 3 Psychiatry: Sad affect Extremities: No pedal edema, no calf tenderness  Data Review: I have personally reviewed the laboratory data and studies available.  F/u labs ordered Unresulted Labs (From admission, onward)     Start     Ordered   Unscheduled  Basic metabolic panel  Tomorrow morning,   R       Question:  Specimen collection method  Answer:  Lab=Lab collect   03/26/22 1420   Unscheduled  CBC with Differential/Platelet  Tomorrow morning,   R       Question:  Specimen collection method  Answer:  Lab=Lab collect   03/26/22 1420            Total time spent in review of labs and imaging, patient evaluation, formulation of plan, documentation and communication with family: 49 minutes  Signed, Terrilee Croak, MD [facility] 03/26/2022

## 2022-03-26 NOTE — TOC Initial Note (Signed)
Transition of Care [facility]) - Initial/Assessment Note    Patient Details  Name: Ryan Jenkins MRN: [id number] Date of Birth: 1960/02/02  Transition of Care [facility]) CM/SW Contact:    Bethena Roys, RN Phone Number: 03/26/2022, 5:48 PM  Clinical Narrative: Risk for readmission assessment completed. Patient presented for nausea, vomiting and dysuria. PTA patient was from home-currently active with [facility] for PT Services. Case Manager spoke with patient and he is agreeable to [facility] OT and [facility] RN for medication and disease management due to high risk for readmission. Patient has DME rolling walker in the home. Case Manager will continue to monitor for transition of care needs as the patient progresses.        Expected Discharge Plan: [place] Barriers to Discharge: Continued Medical Work up  Patient Goals and CMS Choice Patient states their goals for this hospitalization and ongoing recovery are:: to return home     Expected Discharge Plan and Services In-house Referral: NA Discharge Planning Services: CM Consult Post Acute Care Choice: Home Health, Resumption of Svcs/PTA Provider Living arrangements for the past 2 months: Apartment                   DME Agency: NA      HH Arranged: RN, Disease Management, PT, OT HH Agency: [place] Date HH Agency Contacted: 03/26/22 Time [place]: [id number] Representative spoke with at [place]: Claiborne Billings  Prior Living Arrangements/Services Living arrangements for the past 2 months: Apartment Lives with:: Self, Siblings Patient language and need for interpreter reviewed:: Yes Do you feel safe going back to the place where you live?: Yes      Need for Family Participation in Patient Care: Yes (Comment) Care giver support system in place?: Yes (comment) Current home services: DME (rolling walker) Criminal Activity/Legal Involvement Pertinent to Current Situation/Hospitalization: No -  Comment as needed  Activities of Daily Living Home Assistive Devices/Equipment: Walker (specify type) ADL Screening (condition at time of admission) Patient's cognitive ability adequate to safely complete daily activities?: Yes Is the patient deaf or have difficulty hearing?: No Does the patient have difficulty seeing, even when wearing glasses/contacts?: Yes Does the patient have difficulty concentrating, remembering, or making decisions?: No Patient able to express need for assistance with ADLs?: Yes Does the patient have difficulty dressing or bathing?: No Independently performs ADLs?: Yes (appropriate for developmental age) Does the patient have difficulty walking or climbing stairs?: Yes Weakness of Legs: Both Weakness of Arms/Hands: None  Permission Sought/Granted Permission sought to share information with : Customer service manager, Case Optician, dispensing granted to share information with : Yes, Verbal Permission Granted     Permission granted to share info w AGENCY: [place]        Emotional Assessment Appearance:: Appears stated age Attitude/Demeanor/Rapport: Engaged Affect (typically observed): Appropriate Orientation: : Oriented to Situation, Oriented to Place, Oriented to Self, Oriented to  Time Alcohol / Substance Use: Not Applicable Psych Involvement: No (comment)  Admission diagnosis:  Acute cystitis without hematuria [N30.00] Acute respiratory failure with hypoxia ([place]) [J96.01] AKI (acute kidney injury) ([place]) [N17.9] Acute on chronic respiratory failure with hypoxia (HCC) [J96.21] Acute on chronic congestive heart failure, unspecified heart failure type ([place]) [I50.9] Patient Active Problem List   Diagnosis Date Noted   Acute respiratory failure with hypoxia ([place]) 03/21/2022   Generalized weakness 01/25/2022   Acute on chronic systolic heart failure ([place]) 01/24/2022   Multifocal pneumonia 12/22/2021  CHF exacerbation ([place]) 10/19/2021    Hypokalemia 10/18/2021   Anemia in chronic kidney disease (CKD) 10/18/2021   BPH (benign prostatic hyperplasia) 10/16/2021   Severe sepsis ([place]) 06/05/2021   Coronary artery disease 02/07/2021   Acute renal failure superimposed on stage 4 chronic kidney disease (HCC)    CKD (chronic kidney disease) stage 4, GFR 15-29 ml/min ([name]) 07/07/2020   Acute on chronic diastolic CHF (congestive heart failure) ([place]) 07/07/2020   Erectile dysfunction associated with type 2 diabetes mellitus ([place]) 08/07/2019   Vitreous hemorrhage of left eye ([place]) 07/22/2019   Vision loss of left eye 07/22/2019   History of medication noncompliance 04/02/2019   Gastric ulcer without hemorrhage or perforation 12/25/2018   Gastroesophageal reflux disease without esophagitis 09/04/2018   Diabetic retinopathy of both eyes associated with type 2 diabetes mellitus ([place]) 01/03/2018   Intermittent diarrhea 09/27/2017   Gastroparesis 09/27/2017   Macroalbuminuric diabetic nephropathy ([place]) 06/25/2017   History of falling 06/25/2017   HLD (hyperlipidemia) 03/21/2017   Vitamin B 12 deficiency 03/21/2017   DM2 (diabetes mellitus, type 2) ([place]) 02/05/2017   Essential hypertension 02/05/2017   Depression 02/05/2017   Unintended weight loss 02/05/2017   Gait disturbance 02/05/2017   Pronation deformity of both feet 05/11/2014   Diabetic neuropathy, type II diabetes mellitus ([place]) 05/11/2014   Metatarsal deformity 05/11/2014   PCP:  [name], MD Pharmacy:   [facility] [place], [place] - [address] AT [place] [address] [place] [address] Phone: [phone] Fax: [phone]  [id number] [place] [id number] [id number]: [phone]: [phone] Social Determinants of Health ([place] Social History: SDOH Screenings   Food Insecurity: Unknown (03/22/2022)  Housing: Low  Risk  (03/22/2022)  Transportation Needs: Unknown (03/22/2022)  Utilities: Unknown (03/22/2022)  Depression (PHQ2-9): Medium Risk (08/07/2019)  Tobacco Use: Low Risk  (03/21/2022)   SDOH Interventions: Housing Interventions: Patient Refused   Readmission Risk Interventions    03/26/2022    5:46 PM 11/29/2021    4:16 PM  Readmission Risk Prevention Plan  Transportation Screening Complete Complete  Medication Review (RN Care Manager) Referral to Pharmacy Complete  [place] Home Care Consult Complete Complete  SW Recovery Care/Counseling Consult Complete Complete  Palliative Care Screening Not Applicable Not [place] Applicable Not Applicable

## 2022-03-26 NOTE — Progress Notes (Signed)
Mobility Specialist Progress Note:   03/26/22 1100  Mobility  Activity Ambulated with assistance in hallway  Level of Assistance Contact guard assist, steadying assist  Assistive Device Front wheel walker  Distance Ambulated (ft) 200 ft  Activity Response Tolerated well  Mobility Referral Yes  $Mobility charge 1 Mobility   Pt agreeable to mobility session. Required minG assist throughout. Pt c/o 7/10 LLE pain. Pt left sitting EOB with all needs met.   Nelta Numbers Mobility Specialist Please contact via SecureChat or  Rehab office at [phone]

## 2022-03-27 DIAGNOSIS — J9601 Acute respiratory failure with hypoxia: Secondary | ICD-10-CM | POA: Diagnosis not present

## 2022-03-27 LAB — CBC WITH DIFFERENTIAL/PLATELET
Abs Immature Granulocytes: 0.02 10*3/uL (ref 0.00–0.07)
Basophils Absolute: 0.1 10*3/uL (ref 0.0–0.1)
Basophils Relative: 1 %
Eosinophils Absolute: 0.3 10*3/uL (ref 0.0–0.5)
Eosinophils Relative: 5 %
HCT: 26.6 % — ABNORMAL LOW (ref 39.0–52.0)
Hemoglobin: 9.2 g/dL — ABNORMAL LOW (ref 13.0–17.0)
Immature Granulocytes: 0 %
Lymphocytes Relative: 21 %
Lymphs Abs: 1.3 10*3/uL (ref 0.7–4.0)
MCH: 29.8 pg (ref 26.0–34.0)
MCHC: 34.6 g/dL (ref 30.0–36.0)
MCV: 86.1 fL (ref 80.0–100.0)
Monocytes Absolute: 0.7 10*3/uL (ref 0.1–1.0)
Monocytes Relative: 11 %
Neutro Abs: 3.9 10*3/uL (ref 1.7–7.7)
Neutrophils Relative %: 62 %
Platelets: 343 10*3/uL (ref 150–400)
RBC: 3.09 MIL/uL — ABNORMAL LOW (ref 4.22–5.81)
RDW: 13.5 % (ref 11.5–15.5)
WBC: 6.2 10*3/uL (ref 4.0–10.5)
nRBC: 0 % (ref 0.0–0.2)

## 2022-03-27 LAB — BASIC METABOLIC PANEL
Anion gap: 11 (ref 5–15)
BUN: 78 mg/dL — ABNORMAL HIGH (ref 8–23)
CO2: 21 mmol/L — ABNORMAL LOW (ref 22–32)
Calcium: 8.4 mg/dL — ABNORMAL LOW (ref 8.9–10.3)
Chloride: 103 mmol/L (ref 98–111)
Creatinine, Ser: 6.51 mg/dL — ABNORMAL HIGH (ref 0.61–1.24)
GFR, Estimated: 9 mL/min — ABNORMAL LOW (ref >60)
Glucose, Bld: 126 mg/dL — ABNORMAL HIGH (ref 70–99)
Potassium: 3.9 mmol/L (ref 3.5–5.1)
Sodium: 135 mmol/L (ref 135–145)

## 2022-03-27 LAB — GLUCOSE, CAPILLARY
Glucose-Capillary: 147 mg/dL — ABNORMAL HIGH (ref 70–99)
Glucose-Capillary: 133 mg/dL — ABNORMAL HIGH (ref 70–99)
Glucose-Capillary: 97 mg/dL (ref 70–99)

## 2022-03-27 NOTE — Progress Notes (Signed)
Physical Therapy Treatment Patient Details Name: Ryan Jenkins MRN: [id number] DOB: Oct 12, 1960 Today's Date: 03/27/2022   History of Present Illness Pt is 62 yo male who presented on 03/21/22 with UTI symptoms but also found to have pulmonary edema with resp failure and pleuritic chest pain.  Pt with elevated troponin but per MD note likely muscuolskeletal.  Pt with hx including but not limited to HF, CKD, DM2    PT Comments    Pt with improving mobility. Continues to complain of LLE pain especially with sit to stand transition. Continue to recommend home with HHPT. Recommend pt consider OPPT for back/leg pain after HHPT finished.   Recommendations for follow up therapy are one component of a multi-disciplinary discharge planning process, led by the attending physician.  Recommendations may be updated based on patient status, additional functional criteria and insurance authorization.  Follow Up Recommendations  Home health PT     Assistance Recommended at Discharge Intermittent Supervision/Assistance  Patient can return home with the following A little help with walking and/or transfers;A little help with bathing/dressing/bathroom;Assistance with cooking/housework;Help with stairs or ramp for entrance   Equipment Recommendations  None recommended by PT    Recommendations for Other Services       Precautions / Restrictions Precautions Precautions: Fall     Mobility  Bed Mobility               General bed mobility comments: Pt up in chair    Transfers Overall transfer level: Needs assistance Equipment used: Rollator (4 wheels) Transfers: Sit to/from Stand Sit to Stand: Min assist           General transfer comment: Assist to power up from low surface due to painful LLE    Ambulation/Gait Ambulation/Gait assistance: Min guard Gait Distance (Feet): 200 Feet Assistive device: Rollator (4 wheels) Gait Pattern/deviations: Step-through pattern, Decreased stride  length, Trunk flexed Gait velocity: decr Gait velocity interpretation: 1.31 - 2.62 ft/sec, indicative of limited community ambulator   General Gait Details: Assist for safety   Stairs             Wheelchair Mobility    Modified Rankin (Stroke Patients Only)       Balance Overall balance assessment: Needs assistance Sitting-balance support: No upper extremity supported, Feet unsupported Sitting balance-Leahy Scale: Good     Standing balance support: No upper extremity supported Standing balance-Leahy Scale: Fair                              Cognition Arousal/Alertness: Awake/alert Behavior During Therapy: WFL for tasks assessed/performed Overall Cognitive Status: Within Functional Limits for tasks assessed                                          Exercises      General Comments        Pertinent Vitals/Pain Pain Assessment Pain Assessment: Faces Faces Pain Scale: Hurts even more Pain Location: LLE and lt buttock Pain Descriptors / Indicators: Shooting Pain Intervention(s): Limited activity within patient's tolerance, Repositioned    Home Living                          Prior Function            PT Goals (current goals can now be found in  the care plan section) Acute Rehab PT Goals Patient Stated Goal: decrease pain; return home Progress towards PT goals: Progressing toward goals    Frequency    Min 3X/week      PT Plan Current plan remains appropriate    Co-evaluation              AM-PAC PT "6 Clicks" Mobility   Outcome Measure  Help needed turning from your back to your side while in a flat bed without using bedrails?: A Little Help needed moving from lying on your back to sitting on the side of a flat bed without using bedrails?: A Little Help needed moving to and from a bed to a chair (including a wheelchair)?: A Little Help needed standing up from a chair using your arms (e.g.,  wheelchair or bedside chair)?: A Little Help needed to walk in hospital room?: A Little Help needed climbing 3-5 steps with a railing? : A Little 6 Click Score: 18    End of Session   Activity Tolerance: Patient limited by pain Patient left: in chair;with call bell/phone within reach   PT Visit Diagnosis: Other abnormalities of gait and mobility (R26.89);Muscle weakness (generalized) (M62.81)     Time: [id number] PT Time Calculation (min) (ACUTE ONLY): 21 min  Charges:  $Gait Training: 8-22 mins                     [place] 03/27/2022, 11:03 AM

## 2022-03-27 NOTE — Progress Notes (Signed)
PROGRESS NOTE  Ryan Jenkins  DOB: 1960-02-21  PCP: [name], MD [id number]  DOA: 03/21/2022  LOS: 5 days  Hospital Day: 7  Brief narrative: Ryan Jenkins is a 62 y.o. male with PMH significant for DM2, HTN, HLD, CAD, chronic combined systolic and diastolic CHF, CKD 4, diabetic neuropathy, GERD, chronic anemia, anxiety/depression, arthritis, BPH, vitamin B12 deficiency, impaired mobility. 3/6, patient presented to the ED with complaint of cloudy foul-smelling urine for few weeks, nausea, vomiting, intermittent nonproductive cough and pleuritic pain.  Also reported a fever of 102 prior to presentation. In the ED, patient was noted to have O2 sat low at 88%, required 2 L oxygen nasal cannula Chest x-ray showed mild pulm edema Urinalysis was positive for pyuria Labs with creatinine elevated to 6.12 from a baseline of 4.6, serum bicarb low at 17, hemoglobin low at 9.4 BNP greater than 1100, troponin elevated to 60 Patient was given 1 dose of IV Lasix 40 mg, started on IV antibiotic coverage with IV Rocephin Admitted to [facility]  Subjective: Patient was seen and examined this morning.   Sitting up in recliner.  Not in distress.  Continues to have left hip area pain.  We discussed the MRI findings.   Labs from this morning showed creatinine worse at 6.51.  Assessment and plan: UTI with ESBL Klebsiella pneumonia Presented with foul-smelling urine, nausea, vomiting.  Reported fever at home. Urine culture sent on admission grew more than 100,000 CFU per mL of Klebsiella pneumoniae On chart review, I noted that patient had grown ESBL Klebsiella in his previous cultures from November and December 2023.  In the absence of symptoms, he was not treated at that time.  However, this admission, patient had true symptoms of UTI and hence he was started on IV meropenem. Currently on IV meropenem to complete day 7 tomorrow. Recent Labs  Lab 03/23/22 0228 03/24/22 0951 03/25/22 0314  03/26/22 0146 03/27/22 0144  WBC 5.9 6.2 6.3 5.8 6.2    Acute exacerbation of chronic combined systolic and diastolic CHF On presentation, had cough, shortness of breath, pleuritic chest pain,  Chest x-ray with mild pulm edema, labs with elevated BNP, elevated troponin Last echo from December 2023 with EF 50 to 55%, global hypokinesis, moderate LVH, grade 2 diastolic dysfunction, mildly enlarged right ventricular size and mildly reduced right ventricular function PTA, he was on Toprol 50 mg daily, amlodipine 10 mg daily and Lasix 80 mg twice daily.  Reports compliance Patient was started on IV Lasix and later switched to oral Lasix 80 mg twice daily.  Also on Toprol 12.5 g daily and amlodipine 10 mg daily. He had more than 4.5 L of negative balance this hospitalization. Euvolemic at this time. Labs from this morning showed creatinine up to 6.51.  Lasix stopped this morning. Continue to monitor for daily intake output, weight, blood pressure, BNP, renal function and electrolytes. Recent Labs  Lab 03/21/22 1745 03/22/22 0316 03/23/22 0228 03/24/22 0951 03/25/22 0314 03/26/22 0146 03/27/22 0144  BNP 1,113.1*  --   --   --   --   --   --   BUN 64* 63* 66* 61* 62* 69* 78*  CREATININE 6.12* 5.91* 5.78* 5.81* 5.70* 5.82* 6.51*  K 4.6 4.2 4.1 3.9 3.9 3.9 3.9  MG  --  1.7  --   --  1.6* 1.8  --     AKI on CKD 4 Chronic metabolic acidosis due to CKD Baseline creatinine 4.63 from January.  Presented  with creatinine elevated to 6.12, gradually improved. However, with aggressive diuresis, it seems patient's creatinine is creeping up again, 6.51 today.  I stopped Lasix this morning He is making urine, euvolemic, has normal potassium.  I discussed with nephrologist on-call Dr. Moshe Cipro.  No inpatient change in plan from nephrology required at this time.  Will repeat creatinine tomorrow.  If stable, will plan to discharge him to follow-up with nephrology as an outpatient in few days. Seems  to be holding close to 5.8 for last 48 hours.  Probably new baseline. Continue sodium bicarb 650 mg twice daily Follows up with [facility] as an outpatient Recent Labs    12/30/21 0527 01/24/22 1527 01/25/22 0500 03/21/22 1745 03/22/22 0316 03/23/22 0228 03/24/22 0951 03/25/22 0314 03/26/22 0146 03/27/22 0144  BUN 54* 51* 54* 64* 63* 66* 61* 62* 69* 78*  CREATININE 3.74* 4.49* 4.63* 6.12* 5.91* 5.78* 5.81* 5.70* 5.82* 6.51*  CO2 20* 20* 22 17* 16* 18* 18* 19* 19* 21*    Hypomagnesemia/hyperphosphatemia Magnesium level low.  On regular oral magnesium supplement Phosphate level elevated probably because of CKD. Recent Labs  Lab 03/22/22 0316 03/23/22 0228 03/24/22 0951 03/25/22 0314 03/26/22 0146 03/27/22 0144  K 4.2 4.1 3.9 3.9 3.9 3.9  MG 1.7  --   --  1.6* 1.8  --   PHOS 4.4 5.3*  --  5.9* 6.4*  --     Acute respiratory failure with hypoxia Secondary to CHF exacerbation.   Initially required 3 L oxygen nasal cannula. Currently able to ambulate without oxygen.  Demand ischemia On presentation, reported chest pain.  Troponin was mildly elevated as well but stabilized - 55>57. Symptoms and elevated troponin are likely due to CHF in the setting of severe CKD Chest pain was also reproducible on palpation. PTA on Plavix 75 mg daily, Crestor 10 mg daily Continue the same.   Elevated D-dimer ultrasound duplex of lower extremity did not show any evidence of DVT.[name]  BPH Continue finasteride 5 mg daily and Flomax 0.4 mg daily   Type 2 diabetes mellitus A1c 7.4 on 03/22/2022 PTA on Lantus 30 units nightly.  He reports compliance to it. However his blood sugars currently controlled on sliding scale only.   Continue to monitor. Recent Labs  Lab 03/26/22 1239 03/26/22 1558 03/26/22 2100 03/27/22 0923 03/27/22 1146  GLUCAP 108* 101* 177* 147* 133*    Anemia of chronic disease Hemoglobin low but stable. Recent Labs    06/09/21 0934 07/03/21 1110  03/23/22 0228 03/24/22 0951 03/25/22 0314 03/26/22 0146 03/27/22 0144  HGB  --    < > 8.5* 9.7* 8.9* 9.4* 9.2*  MCV  --    < > 90.8 89.8 89.0 88.4 86.1  FERRITIN 295  --   --   --   --   --   --   TIBC 190*  --   --   --   --   --   --   IRON 15*  --   --   --   --   --   --    < > = values in this interval not displayed.     Shoulder Pain Chronic arthritis Degenerative changes of AC joint Currently on pain control with as needed oxycodone  Peripheral neuropathy He was previously on Neurontin 300 mg 3 times daily which he states he is no longer taking because of CKD.    Persistent left lower extremity pain, left hip pain 3/11, MRI lumbar spine  was obtained to look for the cause of acute worsening of back pain.  It showed L4-L5 mild to moderate right and mild left neuroforaminal narrowing probably affecting the descending L5 nerve roots.  It also showed L5-S1 left subarticular annular fissure and mild multilevel facet arthropathy.  I discussed the result with patient this morning.  No need of surgical intervention at this time. Will plan to continue physical therapy and as needed pain meds. Currently on scheduled Tylenol, as needed oxycodone as needed IV Dilaudid.  Mobility: Home with PT recommended by PT  Goals of care   Code Status: Full Code     DVT prophylaxis:  heparin injection 5,000 Units Start: 03/22/22 1400   Antimicrobials: IV meropenem to complete tomorrow Fluid: None Consultants: None Family Communication: None at bedside  Status: Inpatient Level of care:  Telemetry Cardiac   Needs to continue in-hospital care:  On IV meropenem to complete tomorrow.  Also need to repeat BMP tomorrow.  Prognosis:  Guarded prognosis, given severe CKD, CAD and CHF.  Patient from: Home.  Lives with his brother Anticipated d/c to: Hopefully home tomorrow if renal function is stable.   Scheduled Meds:  acetaminophen  1,000 mg Oral TID   amLODipine  10 mg Oral Daily    clopidogrel  75 mg Oral Daily   famotidine  20 mg Oral Daily   finasteride  5 mg Oral Daily   heparin  5,000 Units Subcutaneous Q8H   insulin aspart  0-5 Units Subcutaneous QHS   insulin aspart  0-9 Units Subcutaneous TID WC   lidocaine  1 patch Transdermal Q24H   magnesium oxide  400 mg Oral Daily   metoprolol succinate  12.5 mg Oral Daily   rosuvastatin  10 mg Oral Daily   tamsulosin  0.4 mg Oral Daily    PRN meds: HYDROmorphone (DILAUDID) injection, melatonin, mouth rinse, oxyCODONE, polyethylene glycol, prochlorperazine   Infusions:   meropenem (MERREM) IV 500 mg (03/27/22 1000)    Diet:  Diet Order             Diet heart healthy/carb modified Room service appropriate? Yes; Fluid consistency: Thin  Diet effective now                   Antimicrobials: Anti-infectives (From admission, onward)    Start     Dose/Rate Route Frequency Ordered Stop   03/23/22 1045  meropenem (MERREM) 500 mg in sodium chloride 0.9 % 100 mL IVPB        500 mg 200 mL/hr over 30 Minutes Intravenous Every 12 hours 03/23/22 0958 03/28/22 2359   03/22/22 1800  cefTRIAXone (ROCEPHIN) 2 g in sodium chloride 0.9 % 100 mL IVPB  Status:  Discontinued        2 g 200 mL/hr over 30 Minutes Intravenous Every 24 hours 03/22/22 0300 03/22/22 0441   03/22/22 0600  meropenem (MERREM) 500 mg in sodium chloride 0.9 % 100 mL IVPB  Status:  Discontinued        500 mg 200 mL/hr over 30 Minutes Intravenous Every 12 hours 03/22/22 0447 03/22/22 1530   03/21/22 1945  cefTRIAXone (ROCEPHIN) 2 g in sodium chloride 0.9 % 100 mL IVPB        2 g 200 mL/hr over 30 Minutes Intravenous  Once 03/21/22 1936 03/21/22 2059       Skin assessment:       Nutritional status:  Body mass index is 26.85 kg/m.  Objective: Vitals:   03/27/22 0425 03/27/22 0925  BP: (!) 144/79 (!) 147/69  Pulse: 72 66  Resp: 14 15  Temp: 98.6 F (37 C) 98.6 F (37 C)  SpO2: 100% 100%    Intake/Output Summary (Last  24 hours) at 03/27/2022 1309 Last data filed at 03/27/2022 1000 Gross per 24 hour  Intake 360 ml  Output 575 ml  Net -215 ml    Filed Weights   03/25/22 0510 03/26/22 0316 03/27/22 0433  Weight: 90.3 kg 95.8 kg 94.8 kg   Weight change: -0.904 kg Body mass index is 26.85 kg/m.   Physical Exam: General exam: Pleasant, middle-aged African-American male.  In moderate distress because of lower back pain and left lower extremity pain. Skin: No rashes, lesions or ulcers. HEENT: Atraumatic, normocephalic, no obvious bleeding Lungs: Clear to auscultation bilaterally CVS: Regular rate and rhythm, no murmur GI/Abd soft, nontender, nondistended, bowel sound present CNS: Alert, awake, oriented x 3 Psychiatry: Mood appropriate Extremities: No pedal edema, no calf tenderness  Data Review: I have personally reviewed the laboratory data and studies available.  F/u labs ordered Unresulted Labs (From admission, onward)    None       Total time spent in review of labs and imaging, patient evaluation, formulation of plan, documentation and communication with family: 101 minutes  Signed, Terrilee Croak, MD [facility] 03/27/2022

## 2022-03-28 ENCOUNTER — Inpatient Hospital Stay (HOSPITAL_COMMUNITY): Payer: Medicaid Other

## 2022-03-28 DIAGNOSIS — J9601 Acute respiratory failure with hypoxia: Secondary | ICD-10-CM | POA: Diagnosis not present

## 2022-03-28 LAB — CBC WITH DIFFERENTIAL/PLATELET
Abs Immature Granulocytes: 0.02 10*3/uL (ref 0.00–0.07)
Basophils Absolute: 0 10*3/uL (ref 0.0–0.1)
Basophils Relative: 1 %
Eosinophils Absolute: 0.3 10*3/uL (ref 0.0–0.5)
Eosinophils Relative: 5 %
HCT: 26.4 % — ABNORMAL LOW (ref 39.0–52.0)
Hemoglobin: 9.1 g/dL — ABNORMAL LOW (ref 13.0–17.0)
Immature Granulocytes: 0 %
Lymphocytes Relative: 24 %
Lymphs Abs: 1.4 10*3/uL (ref 0.7–4.0)
MCH: 29.9 pg (ref 26.0–34.0)
MCHC: 34.5 g/dL (ref 30.0–36.0)
MCV: 86.8 fL (ref 80.0–100.0)
Monocytes Absolute: 0.6 10*3/uL (ref 0.1–1.0)
Monocytes Relative: 11 %
Neutro Abs: 3.4 10*3/uL (ref 1.7–7.7)
Neutrophils Relative %: 59 %
Platelets: 380 10*3/uL (ref 150–400)
RBC: 3.04 MIL/uL — ABNORMAL LOW (ref 4.22–5.81)
RDW: 13.8 % (ref 11.5–15.5)
WBC: 5.7 10*3/uL (ref 4.0–10.5)
nRBC: 0 % (ref 0.0–0.2)

## 2022-03-28 LAB — URINALYSIS, COMPLETE (UACMP) WITH MICROSCOPIC
Bilirubin Urine: NEGATIVE
Glucose, UA: NEGATIVE mg/dL
Ketones, ur: NEGATIVE mg/dL
Leukocytes,Ua: NEGATIVE
Nitrite: NEGATIVE
Protein, ur: >=300 mg/dL — AB
Specific Gravity, Urine: 1.012 (ref 1.005–1.030)
pH: 5 (ref 5.0–8.0)

## 2022-03-28 LAB — BASIC METABOLIC PANEL
Anion gap: 13 (ref 5–15)
BUN: 82 mg/dL — ABNORMAL HIGH (ref 8–23)
CO2: 20 mmol/L — ABNORMAL LOW (ref 22–32)
Calcium: 8.3 mg/dL — ABNORMAL LOW (ref 8.9–10.3)
Chloride: 104 mmol/L (ref 98–111)
Creatinine, Ser: 7 mg/dL — ABNORMAL HIGH (ref 0.61–1.24)
GFR, Estimated: 8 mL/min — ABNORMAL LOW (ref >60)
Glucose, Bld: 126 mg/dL — ABNORMAL HIGH (ref 70–99)
Potassium: 4 mmol/L (ref 3.5–5.1)
Sodium: 137 mmol/L (ref 135–145)

## 2022-03-28 LAB — GLUCOSE, CAPILLARY
Glucose-Capillary: 111 mg/dL — ABNORMAL HIGH (ref 70–99)
Glucose-Capillary: 128 mg/dL — ABNORMAL HIGH (ref 70–99)
Glucose-Capillary: 133 mg/dL — ABNORMAL HIGH (ref 70–99)
Glucose-Capillary: 139 mg/dL — ABNORMAL HIGH (ref 70–99)
Glucose-Capillary: 97 mg/dL (ref 70–99)

## 2022-03-28 MED ORDER — POLYETHYLENE GLYCOL 3350 17 G PO PACK
17.0000 g | PACK | Freq: Every day | ORAL | Status: DC
  Administered 2022-03-29: 17 g via ORAL
  Filled 2022-03-28: qty 1

## 2022-03-28 MED ORDER — POLYETHYLENE GLYCOL 3350 17 G PO PACK: 17.0000 g | PACK | Freq: Every day | ORAL | Status: DC | PRN

## 2022-03-28 MED ORDER — SENNOSIDES-DOCUSATE SODIUM 8.6-50 MG PO TABS
1.0000 | ORAL_TABLET | Freq: Every day | ORAL | Status: DC
  Administered 2022-03-28: 1 via ORAL
  Filled 2022-03-28: qty 1

## 2022-03-28 MED ORDER — BISACODYL 5 MG PO TBEC
10.0000 mg | DELAYED_RELEASE_TABLET | Freq: Every day | ORAL | Status: DC
  Administered 2022-03-28: 10 mg via ORAL
  Filled 2022-03-28: qty 2

## 2022-03-28 MED ORDER — DARBEPOETIN ALFA 150 MCG/0.3ML IJ SOSY
150.0000 ug | PREFILLED_SYRINGE | Freq: Once | INTRAMUSCULAR | Status: AC
  Administered 2022-03-28: 150 ug via SUBCUTANEOUS
  Filled 2022-03-28: qty 0.3

## 2022-03-28 NOTE — Progress Notes (Signed)
Occupational Therapy Treatment Patient Details Name: Ryan Jenkins MRN: [id number] DOB: [birth year] Today's Date: 03/28/2022   History of present illness Pt is 62 yo male who presented on 03/21/22 with UTI symptoms but also found to have pulmonary edema with resp failure and pleuritic chest pain.  Pt with elevated troponin but per MD note likely muscuolskeletal.  Pt with hx including but not limited to HF, CKD, DM2   OT comments  Pt making good progress with functional goals. Pt with slow, guarded pace of movement during ADL mobility using RW. Pt educated on energy conservation strategies with handout provided, pt verbalized understanding. Pt declined staying up in recliner at end of session. OT will continue to follow acutely to maximize level of function and safety   Recommendations for follow up therapy are one component of a multi-disciplinary discharge planning process, led by the attending physician.  Recommendations may be updated based on patient status, additional functional criteria and insurance authorization.    Follow Up Recommendations  Home health OT     Assistance Recommended at Discharge Intermittent Supervision/Assistance  Patient can return home with the following  A little help with walking and/or transfers;A little help with bathing/dressing/bathroom;Assistance with cooking/housework;Assist for transportation;Help with stairs or ramp for entrance   Equipment Recommendations  None recommended by OT    Recommendations for Other Services      Precautions / Restrictions Precautions Precautions: Fall Restrictions Weight Bearing Restrictions: No       Mobility Bed Mobility Overal bed mobility: Needs Assistance Bed Mobility: Supine to Sit, Sit to Supine     Supine to sit: Supervision Sit to supine: Supervision   General bed mobility comments: pt declined staying up in recliner at end of session    Transfers Overall transfer level: Needs  assistance Equipment used: Rollator (4 wheels) Transfers: Sit to/from Stand Sit to Stand: Min guard                 Balance Overall balance assessment: Needs assistance Sitting-balance support: No upper extremity supported, Feet unsupported Sitting balance-Leahy Scale: Good     Standing balance support: During functional activity Standing balance-Leahy Scale: Fair                             ADL either performed or assessed with clinical judgement   ADL Overall ADL's : Needs assistance/impaired     Grooming: Min guard;Standing       Lower Body Bathing: Min guard;Sitting/lateral leans Lower Body Bathing Details (indicate cue type and reason): simulated seated in recliner     Lower Body Dressing: Min guard;Sitting/lateral leans Lower Body Dressing Details (indicate cue type and reason): donning socks seated EOB Toilet Transfer: Min guard;Ambulation;Rolling walker (2 wheels)   Toileting- Clothing Manipulation and Hygiene: Supervision/safety;Sit to/from stand         General ADL Comments: pt educated on energy conservation strategies with handout provided, pt verbalized understanding    Extremity/Trunk Assessment Upper Extremity Assessment Upper Extremity Assessment: Overall WFL for tasks assessed   Lower Extremity Assessment Lower Extremity Assessment: Defer to PT evaluation        Vision Baseline Vision/History: 1 Wears glasses Patient Visual Report: No change from baseline     Perception     Praxis      Cognition Arousal/Alertness: Awake/alert   Overall Cognitive Status: Within Functional Limits for tasks assessed  Exercises      Shoulder Instructions       General Comments      Pertinent Vitals/ Pain       Pain Assessment Pain Assessment: No/denies pain Faces Pain Scale: No hurt  Home Living                                          Prior  Functioning/Environment              Frequency  Min 2X/week        Progress Toward Goals  OT Goals(current goals can now be found in the care plan section)  Progress towards OT goals: Progressing toward goals     Plan Discharge plan remains appropriate    Co-evaluation                 AM-PAC OT "6 Clicks" Daily Activity     Outcome Measure   Help from another person eating meals?: None Help from another person taking care of personal grooming?: A Little Help from another person toileting, which includes using toliet, bedpan, or urinal?: A Little Help from another person bathing (including washing, rinsing, drying)?: A Little Help from another person to put on and taking off regular upper body clothing?: A Little Help from another person to put on and taking off regular lower body clothing?: A Little 6 Click Score: 19    End of Session Equipment Utilized During Treatment: Gait belt;Rolling walker (2 wheels)  OT Visit Diagnosis: Unsteadiness on feet (R26.81);Other abnormalities of gait and mobility (R26.89);Muscle weakness (generalized) (M62.81);History of falling (Z91.81)   Activity Tolerance Patient tolerated treatment well   Patient Left with call bell/phone within reach;in bed (pt declined staying in recliner at end of session)   Nurse Communication          [id number] OT Time Calculation (min): 20 min  Charges: OT General Charges $OT Visit: 1 Visit OT Treatments $Self Care/Home Management : 8-22 mins    Britt Bottom 03/28/2022, 2:05 PM

## 2022-03-28 NOTE — Plan of Care (Signed)

## 2022-03-28 NOTE — Progress Notes (Signed)
PROGRESS NOTE  Ryan Jenkins  DOB: [birth year]  PCP: [name], MD VA:[id number]  DOA: 03/21/2022  LOS: 6 days  Hospital Day: 8  Brief narrative: Ryan Jenkins is a 62 y.o. male with PMH significant for DM2, HTN, HLD, CAD, chronic combined systolic and diastolic CHF, CKD 4, diabetic neuropathy, GERD, chronic anemia, anxiety/depression, arthritis, BPH, vitamin B12 deficiency, impaired mobility. 3/6, patient presented to the ED with complaint of cloudy foul-smelling urine for few weeks, nausea, vomiting, intermittent nonproductive cough and pleuritic pain.  Also reported a fever of 102 prior to presentation. In the ED, patient was noted to have O2 sat low at 88%, required 2 L oxygen nasal cannula Chest x-ray showed mild pulm edema Urinalysis was positive for pyuria Labs with creatinine elevated to 6.12 from a baseline of 4.6, serum bicarb low at 17, hemoglobin low at 9.4 BNP greater than 1100, troponin elevated to 60 Patient was given 1 dose of IV Lasix 40 mg, started on IV antibiotic coverage with IV Rocephin Admitted to [facility]  Subjective: Patient was seen and examined this morning.  Lying on bed.  Not in distress.  Left hip pain improving with pain meds. Labs from this morning showed creatinine further up at 7.  Nephrology consulted.  Assessment and plan: UTI with ESBL Klebsiella pneumonia Presented with foul-smelling urine, nausea, vomiting.  Reported fever at home. Urine culture sent on admission grew more than 100,000 CFU per mL of Klebsiella pneumoniae On chart review, I noted that patient had grown ESBL Klebsiella in his previous cultures from November and December 2023.  In the absence of symptoms, he was not treated at that time.  However, this admission, patient had true symptoms of UTI and hence he was started on IV meropenem. Currently on IV meropenem.  To complete the course after tonight's dose. Recent Labs  Lab 03/24/22 0951 03/25/22 0314 03/26/22 0146  03/27/22 0144 03/28/22 0206  WBC 6.2 6.3 5.8 6.2 5.7   Acute exacerbation of chronic combined systolic and diastolic CHF On presentation, had cough, shortness of breath, pleuritic chest pain,  Chest x-ray with mild pulm edema, labs with elevated BNP, elevated troponin Last echo from December 2023 with EF 50 to 55%, global hypokinesis, moderate LVH, grade 2 diastolic dysfunction, mildly enlarged right ventricular size and mildly reduced right ventricular function PTA, he was on Toprol 50 mg daily, amlodipine 10 mg daily and Lasix 80 mg twice daily.  Reports compliance Patient was started on IV Lasix and later switched to oral Lasix 80 mg twice daily.  Also on Toprol 12.5 g daily and amlodipine 10 mg daily. He had more than 4.5 L of negative balance this hospitalization. Euvolemic at this time. 3/12, creatinine went up to 6.51 and Lasix was stopped.  Currently remains on hold. Continue to monitor for daily intake output, weight, blood pressure, BNP, renal function and electrolytes. Recent Labs  Lab 03/21/22 1745 03/22/22 0316 03/23/22 0228 03/24/22 0951 03/25/22 0314 03/26/22 0146 03/27/22 0144 03/28/22 0206  BNP 1,113.1*  --   --   --   --   --   --   --   BUN 64* 63*   < > 61* 62* 69* 78* 82*  CREATININE 6.12* 5.91*   < > 5.81* 5.70* 5.82* 6.51* 7.00*  K 4.6 4.2   < > 3.9 3.9 3.9 3.9 4.0  MG  --  1.7  --   --  1.6* 1.8  --   --    < > =  values in this interval not displayed.   AKI on CKD 4 Chronic metabolic acidosis due to CKD Baseline creatinine 4.63 from January.  Presented with creatinine elevated to 6.12, gradually improved. However, with aggressive diuresis, it seems patient's creatinine started to go up.  It was 6.5 on yesterday.  It is up to 7 today despite Lasix on hold.   Nephrology consult to Dr. Moshe Cipro.   Continue sodium bicarb 650 mg twice daily Follows up with [facility] as an outpatient Recent Labs    01/24/22 1527 01/25/22 0500 03/21/22 1745  03/22/22 0316 03/23/22 0228 03/24/22 0951 03/25/22 0314 03/26/22 0146 03/27/22 0144 03/28/22 0206  BUN 51* 54* 64* 63* 66* 61* 62* 69* 78* 82*  CREATININE 4.49* 4.63* 6.12* 5.91* 5.78* 5.81* 5.70* 5.82* 6.51* 7.00*  CO2 20* 22 17* 16* 18* 18* 19* 19* 21* 20*   Hypomagnesemia/hyperphosphatemia Magnesium level low.  On regular oral magnesium supplement Phosphate level elevated probably because of CKD. Recent Labs  Lab 03/22/22 0316 03/23/22 0228 03/24/22 0951 03/25/22 0314 03/26/22 0146 03/27/22 0144 03/28/22 0206  K 4.2 4.1 3.9 3.9 3.9 3.9 4.0  MG 1.7  --   --  1.6* 1.8  --   --   PHOS 4.4 5.3*  --  5.9* 6.4*  --   --    Acute respiratory failure with hypoxia Secondary to CHF exacerbation.   Initially required 3 L oxygen nasal cannula. Currently able to ambulate without oxygen.  Demand ischemia On presentation, reported chest pain.  Troponin was mildly elevated as well but stabilized - 55>57. Symptoms and elevated troponin are likely due to CHF in the setting of severe CKD Chest pain was also reproducible on palpation. PTA on Plavix 75 mg daily, Crestor 10 mg daily Continue the same.  Elevated D-dimer ultrasound duplex of lower extremity did not show any evidence of DVT.[name]  BPH Continue finasteride 5 mg daily and Flomax 0.4 mg daily   Type 2 diabetes mellitus A1c 7.4 on 03/22/2022 PTA on Lantus 30 units nightly.  He reports compliance to it. However his blood sugars currently controlled on sliding scale only.   Continue to monitor. Recent Labs  Lab 03/27/22 1146 03/27/22 1618 03/28/22 0013 03/28/22 0822 03/28/22 1228  GLUCAP 133* 97 139* 111* 128*   Anemia of chronic disease Hemoglobin low but stable. Recent Labs    06/09/21 0934 07/03/21 1110 03/24/22 0951 03/25/22 0314 03/26/22 0146 03/27/22 0144 03/28/22 0206  HGB  --    < > 9.7* 8.9* 9.4* 9.2* 9.1*  MCV  --    < > 89.8 89.0 88.4 86.1 86.8  FERRITIN 295  --   --   --   --   --   --   TIBC 190*   --   --   --   --   --   --   IRON 15*  --   --   --   --   --   --    < > = values in this interval not displayed.    Shoulder Pain Chronic arthritis Degenerative changes of AC joint Currently on pain control with as needed oxycodone  Peripheral neuropathy He was previously on Neurontin 300 mg 3 times daily which he states he is no longer taking because of CKD.    Persistent left lower extremity pain, left hip pain 3/11, MRI lumbar spine was obtained to look for the cause of acute worsening of back pain.  It showed L4-L5 mild to  moderate right and mild left neuroforaminal narrowing probably affecting the descending L5 nerve roots.  It also showed L5-S1 left subarticular annular fissure and mild multilevel facet arthropathy.  I discussed the result with patient.  No need of surgical intervention at this time. Will plan to continue physical therapy and as needed pain meds. Currently on scheduled Tylenol, as needed oxycodone as needed IV Dilaudid.  Constipation Reports no bowel movement several days.  Starting scheduled and as needed regimen.  Give 1 dose of enema today.  Mobility: Home with PT recommended by PT  Goals of care   Code Status: Full Code     DVT prophylaxis:  heparin injection 5,000 Units Start: 03/22/22 1400   Antimicrobials: IV meropenem to complete tonight. Fluid: None Consultants: None Family Communication: None at bedside  Status: Inpatient Level of care:  Telemetry Cardiac   Needs to continue in-hospital care:  On IV meropenem to complete tonight.  Also has creatinine worsening.  Nephrology following.  Prognosis:  Guarded prognosis, given severe CKD, CAD and CHF.  Patient from: Home.  Lives with his brother Anticipated d/c to: Hopefully home in 1 to 2 days if renal function is stable.   Scheduled Meds:  acetaminophen  1,000 mg Oral TID   amLODipine  10 mg Oral Daily   bisacodyl  10 mg Oral Daily   clopidogrel  75 mg Oral Daily   darbepoetin  (ARANESP) injection - NON-DIALYSIS  150 mcg Subcutaneous Once   famotidine  20 mg Oral Daily   finasteride  5 mg Oral Daily   heparin  5,000 Units Subcutaneous Q8H   insulin aspart  0-5 Units Subcutaneous QHS   insulin aspart  0-9 Units Subcutaneous TID WC   lidocaine  1 patch Transdermal Q24H   magnesium oxide  400 mg Oral Daily   metoprolol succinate  12.5 mg Oral Daily   [START ON 03/29/2022] polyethylene glycol  17 g Oral Daily   rosuvastatin  10 mg Oral Daily   senna-docusate  1 tablet Oral QHS   tamsulosin  0.4 mg Oral Daily    PRN meds: HYDROmorphone (DILAUDID) injection, melatonin, mouth rinse, oxyCODONE, prochlorperazine   Infusions:   meropenem (MERREM) IV 500 mg (03/28/22 0954)    Diet:  Diet Order             Diet heart healthy/carb modified Room service appropriate? Yes; Fluid consistency: Thin  Diet effective now                   Antimicrobials: Anti-infectives (From admission, onward)    Start     Dose/Rate Route Frequency Ordered Stop   03/23/22 1045  meropenem (MERREM) 500 mg in sodium chloride 0.9 % 100 mL IVPB        500 mg 200 mL/hr over 30 Minutes Intravenous Every 12 hours 03/23/22 0958 03/28/22 2359   03/22/22 1800  cefTRIAXone (ROCEPHIN) 2 g in sodium chloride 0.9 % 100 mL IVPB  Status:  Discontinued        2 g 200 mL/hr over 30 Minutes Intravenous Every 24 hours 03/22/22 0300 03/22/22 0441   03/22/22 0600  meropenem (MERREM) 500 mg in sodium chloride 0.9 % 100 mL IVPB  Status:  Discontinued        500 mg 200 mL/hr over 30 Minutes Intravenous Every 12 hours 03/22/22 0447 03/22/22 1530   03/21/22 1945  cefTRIAXone (ROCEPHIN) 2 g in sodium chloride 0.9 % 100 mL IVPB  2 g 200 mL/hr over 30 Minutes Intravenous  Once 03/21/22 1936 03/21/22 2059       Skin assessment:       Nutritional status:  Body mass index is 27.99 kg/m.          Objective: Vitals:   03/28/22 0952 03/28/22 1229  BP: (!) 165/87 (!) 156/78  Pulse: 77  74  Resp: 15 16  Temp:  98 F (36.7 C)  SpO2: 100% 99%    Intake/Output Summary (Last 24 hours) at 03/28/2022 1306 Last data filed at 03/28/2022 1119 Gross per 24 hour  Intake 300 ml  Output 675 ml  Net -375 ml   Filed Weights   03/26/22 0316 03/27/22 0433 03/28/22 1000  Weight: 95.8 kg 94.8 kg 98.9 kg   Weight change:  Body mass index is 27.99 kg/m.   Physical Exam: General exam: Pleasant, middle-aged African-American male.  In moderate distress because of lower back pain and left lower extremity pain. Skin: No rashes, lesions or ulcers. HEENT: Atraumatic, normocephalic, no obvious bleeding Lungs: Clear to auscultation bilaterally CVS: Regular rate and rhythm, no murmur GI/Abd soft, nontender, nondistended, bowel sound present CNS: Alert, awake, oriented x 3 Psychiatry: Mood appropriate Extremities: No pedal edema, no calf tenderness  Data Review: I have personally reviewed the laboratory data and studies available.  F/u labs ordered Unresulted Labs (From admission, onward)     Start     Ordered   03/29/22 0500  Iron and TIBC  Tomorrow morning,   R       Question:  Specimen collection method  Answer:  Lab=Lab collect   03/28/22 1246   03/29/22 0500  Ferritin  Tomorrow morning,   R       Question:  Specimen collection method  Answer:  Lab=Lab collect   03/28/22 1246   03/29/22 0500  Renal function panel  Daily,   R     Question:  Specimen collection method  Answer:  Lab=Lab collect   03/28/22 1246   03/28/22 1238  Urinalysis, Complete w Microscopic -Urine, Clean Catch  Once,   R       Question:  Specimen Source  Answer:  Urine, Clean Catch   03/28/22 1237            Total time spent in review of labs and imaging, patient evaluation, formulation of plan, documentation and communication with family: 33 minutes  Signed, Terrilee Croak, MD [facility] 03/28/2022

## 2022-03-28 NOTE — Plan of Care (Signed)

## 2022-03-28 NOTE — Consult Note (Signed)
[facility] Renal Consultation Note  Requesting MD: Dahal Indication for Consultation: A on CRF  HPI:  Ryan Jenkins is a 62 y.o. male with past medical history significant for HTN, T2 DM, HFpEF, CAD as well as advanced CKD followed at [facility] by Dr. Carolin Sicks-  recent referral to transplant center as well as [facility] for access placement-  has been to dialysis education and opts for incenter HD when and if the time comes.  Seems he has had a little FTT-  hosp q month since October- had TURP-  cont to have some urinary retention and volume overload- a PNA and a UTI-  last discharge on 01/26/22.   He now presented again on 3/7 with urinary complaints and volume overloaded-  has been treated with IV meropenam and also diuresed this admit.  Presenting crt over 6 but then improved to 5.7-5.8 on 3/11 but has trended up the last 2 days-  today crt 7.0 so we are asked to see.  Baseline crt about 4 but has been 6 in the recent past with hospitalizations. There are no overt low BPs noted-  UOP seems adequate-  he is not c/o current urinary sxms-  he says his appetite is down but denies food tasting funny or N/V-  overall feels better /stronger than when he first came in.  No longer has swelling- albumin is less than 3. Hgb 9.1  Creatinine, Ser  Date/Time Value Ref Range Status  03/28/2022 02:06 AM 7.00 (H) 0.61 - 1.24 mg/dL Final  03/27/2022 01:44 AM 6.51 (H) 0.61 - 1.24 mg/dL Final  03/26/2022 01:46 AM 5.82 (H) 0.61 - 1.24 mg/dL Final  03/25/2022 03:14 AM 5.70 (H) 0.61 - 1.24 mg/dL Final  03/24/2022 09:51 AM 5.81 (H) 0.61 - 1.24 mg/dL Final  03/23/2022 02:28 AM 5.78 (H) 0.61 - 1.24 mg/dL Final  03/22/2022 03:16 AM 5.91 (H) 0.61 - 1.24 mg/dL Final  03/21/2022 05:45 PM 6.12 (H) 0.61 - 1.24 mg/dL Final  01/25/2022 05:00 AM 4.63 (H) 0.61 - 1.24 mg/dL Final  01/24/2022 03:27 PM 4.49 (H) 0.61 - 1.24 mg/dL Final  12/30/2021 05:27 AM 3.74 (H) 0.61 - 1.24 mg/dL Final  12/29/2021 04:12 AM 4.05 (H) 0.61 -  1.24 mg/dL Final  12/28/2021 04:23 AM 4.76 (H) 0.61 - 1.24 mg/dL Final  12/27/2021 04:09 AM 6.04 (H) 0.61 - 1.24 mg/dL Final  12/26/2021 03:46 AM 6.29 (H) 0.61 - 1.24 mg/dL Final  12/25/2021 04:04 AM 6.74 (H) 0.61 - 1.24 mg/dL Final  12/24/2021 10:05 AM 6.66 (H) 0.61 - 1.24 mg/dL Final  12/23/2021 04:44 AM 5.43 (H) 0.61 - 1.24 mg/dL Final  12/22/2021 07:30 AM 4.97 (H) 0.61 - 1.24 mg/dL Final  11/29/2021 03:01 AM 4.04 (H) 0.61 - 1.24 mg/dL Final  11/28/2021 01:57 AM 3.85 (H) 0.61 - 1.24 mg/dL Final  11/27/2021 01:43 AM 3.92 (H) 0.61 - 1.24 mg/dL Final  11/26/2021 01:11 AM 3.90 (H) 0.61 - 1.24 mg/dL Final  11/25/2021 12:22 PM 3.79 (H) 0.61 - 1.24 mg/dL Final  11/24/2021 04:56 PM 3.78 (H) 0.61 - 1.24 mg/dL Final  10/31/2021 04:34 PM 2.94 (H) 0.61 - 1.24 mg/dL Final  10/22/2021 07:19 AM 4.14 (H) 0.61 - 1.24 mg/dL Final  10/21/2021 06:49 AM 4.06 (H) 0.61 - 1.24 mg/dL Final  10/20/2021 06:46 AM 4.14 (H) 0.61 - 1.24 mg/dL Final  10/19/2021 06:23 AM 3.56 (H) 0.61 - 1.24 mg/dL Final  10/18/2021 07:37 AM 3.85 (H) 0.61 - 1.24 mg/dL Final  10/17/2021 04:45 AM 3.49 (H) 0.61 -  1.24 mg/dL Final  10/16/2021 04:22 PM 3.26 (H) 0.61 - 1.24 mg/dL Final  10/10/2021 11:54 AM 3.08 (H) 0.61 - 1.24 mg/dL Final  07/09/2021 12:12 PM 2.59 (H) 0.61 - 1.24 mg/dL Final  07/04/2021 06:01 AM 2.64 (H) 0.61 - 1.24 mg/dL Final  07/03/2021 11:26 AM 2.80 (H) 0.61 - 1.24 mg/dL Final  07/03/2021 11:10 AM 2.52 (H) 0.61 - 1.24 mg/dL Final  06/13/2021 01:48 AM 3.83 (H) 0.61 - 1.24 mg/dL Final  06/12/2021 01:55 AM 4.16 (H) 0.61 - 1.24 mg/dL Final  06/11/2021 02:06 AM 4.53 (H) 0.61 - 1.24 mg/dL Final  06/10/2021 01:37 AM 4.73 (H) 0.61 - 1.24 mg/dL Final  06/09/2021 01:48 AM 4.96 (H) 0.61 - 1.24 mg/dL Final  06/08/2021 02:08 AM 5.07 (H) 0.61 - 1.24 mg/dL Final  06/07/2021 06:35 AM 4.87 (H) 0.61 - 1.24 mg/dL Final  06/06/2021 04:46 AM 4.35 (H) 0.61 - 1.24 mg/dL Final  06/05/2021 04:14 AM 3.74 (H) 0.61 - 1.24 mg/dL Final   06/04/2021 04:20 PM 3.43 (H) 0.61 - 1.24 mg/dL Final  02/08/2021 04:28 AM 2.73 (H) 0.61 - 1.24 mg/dL Final  02/07/2021 04:38 AM 2.76 (H) 0.61 - 1.24 mg/dL Final  02/06/2021 04:23 AM 2.88 (H) 0.61 - 1.24 mg/dL Final  02/05/2021 04:37 AM 3.31 (H) 0.61 - 1.24 mg/dL Final     PMHx:   Past Medical History:  Diagnosis Date   Anemia    Anginal pain ([place])    Anxiety    Arthritis    BPH with obstruction/lower urinary tract symptoms    CAD (coronary artery disease)    cardiologist--- dr Junius Roads badal;  11-06-2019 cardiac cath in [place] (result in care everywhere)  nonobstructive cad involing pRCA 60% (done in setting worseing CHF/ acute pulmonary edema requiring intubation/ AKI   Chronic combined systolic and diastolic CHF (congestive heart failure) ([place])    Chronic kidney disease, stage IV (severe) ([place])    nephrologist--- dr Carolin Sicks   Depression    Diabetic neuropathy ([place])    Dyspnea    Edema of both lower extremities    GERD (gastroesophageal reflux disease)    Heart murmur    History of community acquired pneumonia    ad[facility]  w/ ARF hypoxia w/ severe sepsis   Hyperlipidemia    Hypertension    Insulin dependent type 2 diabetes mellitus ([place])    Pneumonia    Retinopathy due to secondary diabetes ([place])    Uses walker    Vitamin B12 deficiency    Vitreous hemorrhage [facility])     Past Surgical History:  Procedure Laterality Date   CARDIAC CATHETERIZATION  11/06/2019   [place] in [place];    nonobstructive CAD , pRCA 60% (result in care everywhere)   CATARACT EXTRACTION W/ INTRAOCULAR LENS IMPLANT Bilateral 2017   TRANSURETHRAL RESECTION OF PROSTATE N/A 10/16/2021   Procedure: TRANSURETHRAL RESECTION OF THE PROSTATE (TURP);  Surgeon: Janith Lima, MD;  Location: [facility];  Service: Urology;  Laterality: N/A;    Family Hx:  Family History  Problem Relation Age of Onset   Renal cancer Mother    Hypertension Mother    Pancreatic cancer  Mother    Hypertension Sister    Stroke Sister    Leukemia Maternal Uncle    Sickle cell trait Maternal Aunt    Colon cancer Neg Hx    Esophageal cancer Neg Hx    Rectal cancer Neg Hx    Stomach cancer Neg Hx  Social History:  reports that he has never smoked. He has never used smokeless tobacco. He reports that he does not drink alcohol and does not use drugs.  Allergies:  Allergies  Allergen Reactions   Claritin [Loratadine] Swelling    Joint swelling    Hydrochlorothiazide Other (See Comments)    Dizziness   Latex Hives and Swelling   Lyrica [Pregabalin] Other (See Comments)    Depression. "Makes me loopy"    Metformin And Related Diarrhea and Nausea And Vomiting    Medications: Prior to Admission medications   Medication Sig Start Date End Date Taking? Authorizing Provider  acetaminophen (TYLENOL) 500 MG tablet Take 1,000 mg by mouth daily as needed for mild pain or headache.   Yes [provider]  amLODipine (NORVASC) 10 MG tablet Take 1 tablet (10 mg total) by mouth daily. 02/08/21  Yes Danford, Suann Larry, MD  calcitRIOL (ROCALTROL) 0.25 MCG capsule Take 0.25 mcg by mouth daily. 01/01/22  Yes [provider]  Cholecalciferol (VITAMIN D3) 1.25 MG (50000 UT) CAPS Take 50,000 Units by mouth every 7 (seven) days. 09/12/21  Yes [provider]  clopidogrel (PLAVIX) 75 MG tablet Take 1 tablet (75 mg total) by mouth daily. 07/05/21  Yes de Yolanda Manges, Cortney E, NP  famotidine (PEPCID) 40 MG tablet Take 40 mg by mouth daily. 08/08/21  Yes [provider]  finasteride (PROSCAR) 5 MG tablet Take 1 tablet (5 mg total) by mouth daily. 07/20/20  Yes Charlott Rakes, MD  gabapentin (NEURONTIN) 300 MG capsule Take 1 capsule (300 mg total) by mouth daily as needed (neuropathic pain). Patient taking differently: Take 300 mg by mouth 3 (three) times daily. 11/29/21  Yes Barton Dubois, MD  LANTUS SOLOSTAR 100 UNIT/ML Solostar Pen Inject 22 Units into the  skin at bedtime. Patient taking differently: Inject 30 Units into the skin at bedtime. 11/29/21  Yes Barton Dubois, MD  meclizine (ANTIVERT) 25 MG tablet Take 25 mg by mouth daily as needed for dizziness. 08/08/21  Yes [provider]  rosuvastatin (CRESTOR) 10 MG tablet Take 10 mg by mouth daily.   Yes [provider]  sodium bicarbonate 650 MG tablet Take 650 mg by mouth 2 (two) times daily. 09/20/21  Yes [provider]  furosemide (LASIX) 80 MG tablet Take 1 tablet (80 mg total) by mouth 2 (two) times daily. Patient not taking: Reported on 03/22/2022 01/26/22 02/25/22  Dana Allan I, MD  hydrALAZINE (APRESOLINE) 100 MG tablet Take 1 tablet (100 mg total) by mouth 2 (two) times daily. Patient not taking: Reported on 03/22/2022 01/26/22 02/25/22  Dana Allan I, MD  metoprolol succinate (TOPROL-XL) 50 MG 24 hr tablet Take 1 tablet (50 mg total) by mouth at bedtime. Take with or immediately following a meal. Patient not taking: Reported on 01/25/2022 02/08/21   Edwin Dada, MD  tamsulosin (FLOMAX) 0.4 MG CAPS capsule Take 0.4 mg by mouth daily. 05/25/21   [provider]    I have reviewed the patient's current medications.  Labs:  Results for orders placed or performed during the hospital encounter of 03/21/22 (from the past 48 hour(s))  Glucose, capillary     Status: Abnormal   Collection Time: 03/26/22 12:39 PM  Result Value Ref Range   Glucose-Capillary 108 (H) 70 - 99 mg/dL    Comment: Glucose reference range applies only to samples taken after fasting for at least 8 hours.  Glucose, capillary     Status: Abnormal   Collection  Time: 03/26/22  3:58 PM  Result Value Ref Range   Glucose-Capillary 101 (H) 70 - 99 mg/dL    Comment: Glucose reference range applies only to samples taken after fasting for at least 8 hours.  Glucose, capillary     Status: Abnormal   Collection Time: 03/26/22  9:00 PM  Result Value Ref Range   Glucose-Capillary  177 (H) 70 - 99 mg/dL    Comment: Glucose reference range applies only to samples taken after fasting for at least 8 hours.  Basic metabolic panel     Status: Abnormal   Collection Time: 03/27/22  1:44 AM  Result Value Ref Range   Sodium 135 135 - 145 mmol/L   Potassium 3.9 3.5 - 5.1 mmol/L   Chloride 103 98 - 111 mmol/L   CO2 21 (L) 22 - 32 mmol/L   Glucose, Bld 126 (H) 70 - 99 mg/dL    Comment: Glucose reference range applies only to samples taken after fasting for at least 8 hours.   BUN 78 (H) 8 - 23 mg/dL   Creatinine, Ser 6.51 (H) 0.61 - 1.24 mg/dL   Calcium 8.4 (L) 8.9 - 10.3 mg/dL   GFR, Estimated 9 (L) >60 mL/min    Comment: (NOTE) Calculated using the CKD-EPI Creatinine Equation (2021)    Anion gap 11 5 - 15    Comment: Performed at [facility] [address], [place], [place] [address]  CBC with Differential/Platelet     Status: Abnormal   Collection Time: 03/27/22  1:44 AM  Result Value Ref Range   WBC 6.2 4.0 - 10.5 K/uL   RBC 3.09 (L) 4.22 - 5.81 MIL/uL   Hemoglobin 9.2 (L) 13.0 - 17.0 g/dL   HCT 26.6 (L) 39.0 - 52.0 %   MCV 86.1 80.0 - 100.0 fL   MCH 29.8 26.0 - 34.0 pg   MCHC 34.6 30.0 - 36.0 g/dL   RDW 13.5 11.5 - 15.5 %   Platelets 343 150 - 400 K/uL   nRBC 0.0 0.0 - 0.2 %   Neutrophils Relative % 62 %   Neutro Abs 3.9 1.7 - 7.7 K/uL   Lymphocytes Relative 21 %   Lymphs Abs 1.3 0.7 - 4.0 K/uL   Monocytes Relative 11 %   Monocytes Absolute 0.7 0.1 - 1.0 K/uL   Eosinophils Relative 5 %   Eosinophils Absolute 0.3 0.0 - 0.5 K/uL   Basophils Relative 1 %   Basophils Absolute 0.1 0.0 - 0.1 K/uL   Immature Granulocytes 0 %   Abs Immature Granulocytes 0.02 0.00 - 0.07 K/uL    Comment: Performed at [facility] [address] [place], [place] [address]  Glucose, capillary     Status: Abnormal   Collection Time: 03/27/22  9:23 AM  Result Value Ref Range   Glucose-Capillary 147 (H) 70 - 99 mg/dL    Comment: Glucose reference range applies only  to samples taken after fasting for at least 8 hours.  Glucose, capillary     Status: Abnormal   Collection Time: 03/27/22 11:46 AM  Result Value Ref Range   Glucose-Capillary 133 (H) 70 - 99 mg/dL    Comment: Glucose reference range applies only to samples taken after fasting for at least 8 hours.   Comment 1 QC Due   Glucose, capillary     Status: None   Collection Time: 03/27/22  4:18 PM  Result Value Ref Range   Glucose-Capillary 97 70 - 99 mg/dL  Comment: Glucose reference range applies only to samples taken after fasting for at least 8 hours.  Glucose, capillary     Status: Abnormal   Collection Time: 03/28/22 12:13 AM  Result Value Ref Range   Glucose-Capillary 139 (H) 70 - 99 mg/dL    Comment: Glucose reference range applies only to samples taken after fasting for at least 8 hours.  CBC with Differential/Platelet     Status: Abnormal   Collection Time: 03/28/22  2:06 AM  Result Value Ref Range   WBC 5.7 4.0 - 10.5 K/uL   RBC 3.04 (L) 4.22 - 5.81 MIL/uL   Hemoglobin 9.1 (L) 13.0 - 17.0 g/dL   HCT 26.4 (L) 39.0 - 52.0 %   MCV 86.8 80.0 - 100.0 fL   MCH 29.9 26.0 - 34.0 pg   MCHC 34.5 30.0 - 36.0 g/dL   RDW 13.8 11.5 - 15.5 %   Platelets 380 150 - 400 K/uL   nRBC 0.0 0.0 - 0.2 %   Neutrophils Relative % 59 %   Neutro Abs 3.4 1.7 - 7.7 K/uL   Lymphocytes Relative 24 %   Lymphs Abs 1.4 0.7 - 4.0 K/uL   Monocytes Relative 11 %   Monocytes Absolute 0.6 0.1 - 1.0 K/uL   Eosinophils Relative 5 %   Eosinophils Absolute 0.3 0.0 - 0.5 K/uL   Basophils Relative 1 %   Basophils Absolute 0.0 0.0 - 0.1 K/uL   Immature Granulocytes 0 %   Abs Immature Granulocytes 0.02 0.00 - 0.07 K/uL    Comment: Performed at [facility], [address] [place], [place] [id number]  Basic metabolic panel     Status: Abnormal   Collection Time: 03/28/22  2:06 AM  Result Value Ref Range   Sodium 137 135 - 145 mmol/L   Potassium 4.0 3.5 - 5.1 mmol/L   Chloride 104 98 - 111 mmol/L   CO2 20  (L) 22 - 32 mmol/L   Glucose, Bld 126 (H) 70 - 99 mg/dL    Comment: Glucose reference range applies only to samples taken after fasting for at least 8 hours.   BUN 82 (H) 8 - 23 mg/dL   Creatinine, Ser 7.00 (H) 0.61 - 1.24 mg/dL   Calcium 8.3 (L) 8.9 - 10.3 mg/dL   GFR, Estimated 8 (L) >60 mL/min    Comment: (NOTE) Calculated using the CKD-EPI Creatinine Equation (2021)    Anion gap 13 5 - 15    Comment: Performed at [facility] [address] [place], [place] [address]  Glucose, capillary     Status: Abnormal   Collection Time: 03/28/22  8:22 AM  Result Value Ref Range   Glucose-Capillary 111 (H) 70 - 99 mg/dL    Comment: Glucose reference range applies only to samples taken after fasting for at least 8 hours.     ROS:  A comprehensive review of systems was negative except for: Constitutional: positive for anorexia  Physical Exam: Vitals:   03/27/22 2033 03/28/22 0952  BP: 133/71 (!) 165/87  Pulse: 70 77  Resp: 16 15  Temp: 98.2 F (36.8 C)   SpO2: 98% 100%     General: well appearing BM-  NAD HEENT: PERRLA, EOMI, mucous membranes moist  Neck:no JVD Heart: RRR Lungs: mostly clear Abdomen: soft, non tender Extremities: no edema  Skin:warm and dry Neuro: non focal   Assessment/Plan: 62 year old BM-  DM, HTN, CAD also with known stage 4/5 CKD followed by  [facility]. He has some A on CRF in the setting of hospitalization for UTI and volume overload-  1.Renal- advanced CKD at baseline-  were in the stage of referring for AVF and also referring to transplant (  they felt he needed better mobility) now with some A on CRF in the setting of above.  I cannot get him to complain of overwhelming uremic sxms-  but with low albumin and many recent hospitalizations he may be getting closer to being dialysis requiring.  However, I dont feel we need to initiate dialysis right at this time.  I will recheck U/A-  check renal ultrasound as urinary retention has been an issue and check  orthostatics to rule out decreased renal perfusion and try to fix any of those if they are present -  but if all that is negative I think we just continue to monitor for stability or improvement at which time I think he could continue to be followed as OP and pursue permanent HD access 2. Hypertension/volume  - seems euvolemic and BP is OK-  no changes needed-  I like continuing to hold lasix but does not need IVF 3. Anemia  - will check iron stores and give him a dose of ESA 4. UTI-  completed meropenam-  will recheck UA to make sure urine looking better    Louis Meckel 03/28/2022, 11:47 AM

## 2022-03-29 LAB — RENAL FUNCTION PANEL
Albumin: 2.9 g/dL — ABNORMAL LOW (ref 3.5–5.0)
Anion gap: 10 (ref 5–15)
BUN: 83 mg/dL — ABNORMAL HIGH (ref 8–23)
CO2: 21 mmol/L — ABNORMAL LOW (ref 22–32)
Calcium: 8.2 mg/dL — ABNORMAL LOW (ref 8.9–10.3)
Chloride: 106 mmol/L (ref 98–111)
Creatinine, Ser: 6.81 mg/dL — ABNORMAL HIGH (ref 0.61–1.24)
GFR, Estimated: 9 mL/min — ABNORMAL LOW (ref >60)
Glucose, Bld: 124 mg/dL — ABNORMAL HIGH (ref 70–99)
Phosphorus: 7.3 mg/dL — ABNORMAL HIGH (ref 2.5–4.6)
Potassium: 4 mmol/L (ref 3.5–5.1)
Sodium: 137 mmol/L (ref 135–145)

## 2022-03-29 LAB — GLUCOSE, CAPILLARY
Glucose-Capillary: 120 mg/dL — ABNORMAL HIGH (ref 70–99)
Glucose-Capillary: 129 mg/dL — ABNORMAL HIGH (ref 70–99)
Glucose-Capillary: 106 mg/dL — ABNORMAL HIGH (ref 70–99)

## 2022-03-29 LAB — IRON AND TIBC
Iron: 39 ug/dL — ABNORMAL LOW (ref 45–182)
Saturation Ratios: 18 % (ref 17.9–39.5)
TIBC: 216 ug/dL — ABNORMAL LOW (ref 250–450)
UIBC: 177 ug/dL

## 2022-03-29 LAB — FERRITIN: Ferritin: 192 ng/mL (ref 24–336)

## 2022-03-29 MED ORDER — OXYCODONE HCL 5 MG PO TABS: 5.0000 mg | ORAL_TABLET | Freq: Three times a day (TID) | ORAL | 0 refills | Status: AC | PRN

## 2022-03-29 MED ORDER — BISACODYL 5 MG PO TBEC
10.0000 mg | DELAYED_RELEASE_TABLET | Freq: Once | ORAL | Status: AC
  Administered 2022-03-29: 10 mg via ORAL
  Filled 2022-03-29: qty 2

## 2022-03-29 MED ORDER — SODIUM CHLORIDE 0.9 % IV SOLN
250.0000 mg | Freq: Once | INTRAVENOUS | Status: AC
  Administered 2022-03-29: 250 mg via INTRAVENOUS
  Filled 2022-03-29: qty 20

## 2022-03-29 MED ORDER — POLYETHYLENE GLYCOL 3350 17 G PO PACK: 17.0000 g | PACK | Freq: Every day | ORAL | 0 refills | Status: DC

## 2022-03-29 MED ORDER — FUROSEMIDE 80 MG PO TABS: 80.0000 mg | ORAL_TABLET | Freq: Two times a day (BID) | ORAL | 2 refills | Status: DC

## 2022-03-29 MED ORDER — SENNOSIDES-DOCUSATE SODIUM 8.6-50 MG PO TABS: 1.0000 | ORAL_TABLET | Freq: Every day | ORAL | 2 refills | Status: DC

## 2022-03-29 MED ORDER — BISACODYL 5 MG PO TBEC: 5.0000 mg | DELAYED_RELEASE_TABLET | Freq: Every day | ORAL | 0 refills | Status: AC

## 2022-03-29 MED ORDER — LANTUS SOLOSTAR 100 UNIT/ML ~~LOC~~ SOPN: 5.0000 [IU] | PEN_INJECTOR | Freq: Every day | SUBCUTANEOUS | Status: AC

## 2022-03-29 MED ORDER — LIDOCAINE 4 % EX PTCH: 1.0000 | MEDICATED_PATCH | Freq: Every day | CUTANEOUS | 3 refills | Status: AC | PRN

## 2022-03-29 MED ORDER — METOPROLOL SUCCINATE ER 25 MG PO TB24: 12.5000 mg | ORAL_TABLET | Freq: Every day | ORAL | 2 refills | Status: DC

## 2022-03-29 NOTE — Plan of Care (Signed)

## 2022-03-29 NOTE — Progress Notes (Signed)
Subjective:  nauseated some this AM-  was not yesterday -  also did not really get up yesterday either but says he is OK to go home -- numbers are actually better  Objective Vital signs in last 24 hours: Vitals:   03/28/22 1229 03/28/22 2046 03/29/22 0342 03/29/22 0807  BP: (!) 156/78 (!) 152/76 129/82 (!) 155/77  Pulse: 74 74 69 69  Resp: '16 16 17 19  '$ Temp: 98 F (36.7 C) 97.7 F (36.5 C) 97.9 F (36.6 C) 98.1 F (36.7 C)  TempSrc: Oral Oral Oral Oral  SpO2: 99% 100% 98% 100%  Weight:   96.8 kg   Height:       Weight change:   Intake/Output Summary (Last 24 hours) at 03/29/2022 0929 Last data filed at 03/29/2022 0913 Gross per 24 hour  Intake 466.11 ml  Output 900 ml  Net -433.89 ml    Assessment/Plan: 62 year old BM-  DM, HTN, CAD also with known stage 4/5 CKD followed by [facility]. He has some A on CRF in the setting of hospitalization for UTI and volume overload-  1.Renal- advanced CKD at baseline-  were in the stage of referring for AVF and also referring to transplant (  they felt he needed better mobility) now with some A on CRF in the setting of above.  I cannot get him to complain of overwhelming uremic sxms-  but with low albumin and many recent hospitalizations he may be getting closer to being dialysis requiring.  However, I dont feel we need to initiate dialysis right at this time.  I will recheck U/A was OK-   renal ultrasound some bladder distention but no hydro.   orthostatics were negative and numbers actually stable to a little improved today.  If he was feeling fantastic I would have no reservation for him to be discharged-  the fact that he is not feeling great gives me a little pause.  I have told him that I think some of these sxms could be from his kidneys-  if the sxms are tolerable can proceed with OP plans and close follow up at [place] but if sxms are not tolerable would maybe benefit from dialysis initiation sooner rather than later.  If is feeling better later again I  am ok with discharge and will arrange close follow up  2. Hypertension/volume  - seems euvolemic and BP is OK-  no changes needed-  I like continuing to hold lasix but does not need IVF-  will need to resume lasix at discharge 80 BID 3. Anemia  -  iron stores low-  give iv iron prior to discharge and gave him a dose of ESA 4. UTI-  completed meropenam-  will recheck UA to make sure urine looking better - it is   Louis Meckel    Labs: Basic Metabolic Panel: Recent Labs  Lab 03/25/22 0314 03/26/22 0146 03/27/22 0144 03/28/22 0206 03/29/22 0140  NA 138 136 135 137 137  K 3.9 3.9 3.9 4.0 4.0  CL 106 105 103 104 106  CO2 19* 19* 21* 20* 21*  GLUCOSE 103* 87 126* 126* 124*  BUN 62* 69* 78* 82* 83*  CREATININE 5.70* 5.82* 6.51* 7.00* 6.81*  CALCIUM 8.5* 8.6* 8.4* 8.3* 8.2*  PHOS 5.9* 6.4*  --   --  7.3*   Liver Function Tests: Recent Labs  Lab 03/24/22 0951 03/25/22 0314 03/29/22 0140  AST 14* 12*  --   ALT 9 9  --  ALKPHOS 61 54  --   BILITOT 0.6 0.3  --   PROT 7.5 7.0  --   ALBUMIN 3.2* 2.9* 2.9*   No results for input(s): "LIPASE", "AMYLASE" in the last 168 hours. No results for input(s): "AMMONIA" in the last 168 hours. CBC: Recent Labs  Lab 03/24/22 0951 03/25/22 0314 03/26/22 0146 03/27/22 0144 03/28/22 0206  WBC 6.2 6.3 5.8 6.2 5.7  NEUTROABS 4.1 3.9  --  3.9 3.4  HGB 9.7* 8.9* 9.4* 9.2* 9.1*  HCT 29.1* 26.8* 27.5* 26.6* 26.4*  MCV 89.8 89.0 88.4 86.1 86.8  PLT 336 317 324 343 380   Cardiac Enzymes: No results for input(s): "CKTOTAL", "CKMB", "CKMBINDEX", "TROPONINI" in the last 168 hours. CBG: Recent Labs  Lab 03/28/22 0822 03/28/22 1228 03/28/22 1701 03/28/22 2054 03/29/22 0810  GLUCAP 111* 128* 97 133* 120*    Iron Studies:  Recent Labs    03/29/22 0140  IRON 39*  TIBC 216*  FERRITIN 192   Studies/Results: US RENAL  Result Date: 03/28/2022 CLINICAL DATA:  [id number] AKI (acute kidney injury) ([place]) [id number] EXAM: RENAL / URINARY  TRACT ULTRASOUND COMPLETE COMPARISON:  None Available. FINDINGS: Right Kidney: Renal measurements: 11.6 x 5.8 x 5.9 cm = volume: 207.2 mL. Increased renal cortical echogenicity. No hydronephrosis. Left Kidney: Renal measurements: 11.2 x 6.3 x 6.6 cm = volume: 245.5 mL. Increased renal cortical echogenicity. No hydronephrosis. Bladder: Mildly distended with circumferential bladder wall thickening. Other: None. IMPRESSION: No hydronephrosis. Increased renal cortical echogenicity bilaterally as can be seen with medical renal disease. Mildly distended bladder with circumferential bladder wall thickening, could be due to under-distension or cystitis, recommend correlation with urinalysis. Electronically Signed   By: Maurine Simmering M.D.   On: 03/28/2022 16:59   Medications: Infusions:   Scheduled Medications:  acetaminophen  1,000 mg Oral TID   amLODipine  10 mg Oral Daily   clopidogrel  75 mg Oral Daily   famotidine  20 mg Oral Daily   finasteride  5 mg Oral Daily   heparin  5,000 Units Subcutaneous Q8H   insulin aspart  0-5 Units Subcutaneous QHS   insulin aspart  0-9 Units Subcutaneous TID WC   lidocaine  1 patch Transdermal Q24H   magnesium oxide  400 mg Oral Daily   metoprolol succinate  12.5 mg Oral Daily   polyethylene glycol  17 g Oral Daily   rosuvastatin  10 mg Oral Daily   senna-docusate  1 tablet Oral QHS   tamsulosin  0.4 mg Oral Daily    have reviewed scheduled and prn medications.  Physical Exam: General:  NAD but has emesis bag in bed Heart: RRR Lungs: mostly clear Abdomen: soft, non tender Extremities: really no edema  Dialysis Access: none yet     03/29/2022,9:29 AM  LOS: 7 days

## 2022-03-29 NOTE — Plan of Care (Signed)

## 2022-03-29 NOTE — Progress Notes (Signed)
Patient is being discharged. Taken down to main entrance to sister who is here to pick them up. Pt is stable,  A&Ox4.

## 2022-03-29 NOTE — Progress Notes (Signed)
Physician Discharge Summary  Ryan Jenkins [id number] DOB: 1960-06-20 DOA: 03/21/2022  PCP: [name], MD  Admit date: 03/21/2022 Discharge date: 03/29/2022  Admitted From: Home Discharge disposition: Home with home health  Recommendations at discharge:  Lasix has been resumed at 80 mg twice daily. Follow-up with nephrologist as an outpatient for renal function monitoring and consideration of dialysis Continue regular bowel regimen at home. You have not been requiring long-acting insulin here in the hospital.  Resume Lantus only at 5 units daily.  Continue to escalate the dose based on blood sugar response.  Brief narrative: Ryan Jenkins is a 62 y.o. male with PMH significant for DM2, HTN, HLD, CAD, chronic combined systolic and diastolic CHF, CKD 4, diabetic neuropathy, GERD, chronic anemia, anxiety/depression, arthritis, BPH, vitamin B12 deficiency, impaired mobility. 3/6, patient presented to the ED with complaint of cloudy foul-smelling urine for few weeks, nausea, vomiting, intermittent nonproductive cough and pleuritic pain.  Also reported a fever of 102 prior to presentation. In the ED, patient was noted to have O2 sat low at 88%, required 2 L oxygen nasal cannula Chest x-ray showed mild pulm edema Urinalysis was positive for pyuria Labs with creatinine elevated to 6.12 from a baseline of 4.6, serum bicarb low at 17, hemoglobin low at 9.4 BNP greater than 1100, troponin elevated to 60 Patient was given 1 dose of IV Lasix 40 mg, started on IV antibiotic coverage with IV Rocephin Admitted to [facility]  Subjective: Patient was seen and examined this morning.  Sitting up in recliner.  Not in distress.  Pain improving.  No BM in 3 days but refused MiraLAX and enema.  Hospital course: UTI with ESBL Klebsiella pneumonia Presented with foul-smelling urine, nausea, vomiting.  Reported fever at home. Urine culture sent on admission grew more than 100,000 CFU per mL of  Klebsiella pneumoniae On chart review, I noted that patient had grown ESBL Klebsiella in his previous cultures from November and December 2023.  In the absence of symptoms, he was not treated at that time.  However, this admission, patient had true symptoms of UTI and hence he was started on IV meropenem.  Completed 7-day course of IV meropenem.  Afebrile, WBC count normal no antibiotics needed at discharge Recent Labs  Lab 03/24/22 0951 03/25/22 0314 03/26/22 0146 03/27/22 0144 03/28/22 0206  WBC 6.2 6.3 5.8 6.2 5.7   Acute exacerbation of chronic combined systolic and diastolic CHF On presentation, had cough, shortness of breath, pleuritic chest pain,  Chest x-ray with mild pulm edema, labs with elevated BNP, elevated troponin Last echo from December 2023 with EF 50 to 55%, global hypokinesis, moderate LVH, grade 2 diastolic dysfunction, mildly enlarged right ventricular size and mildly reduced right ventricular function PTA, he was on Toprol 50 mg daily, amlodipine 10 mg daily and Lasix 80 mg twice daily.  Reports compliance Patient was started on IV Lasix and later switched to oral Lasix 80 mg twice daily.  Also on Toprol 12.5 g daily and amlodipine 10 mg daily. He had more than 4.5 L of negative balance this hospitalization. Euvolemic at this time.  Lasix was held transiently because of worsening creatinine.  Per nephrology recommendation, I would put him back on Lasix 80 mg twice daily at discharge. Recent Labs  Lab 03/25/22 0314 03/26/22 0146 03/27/22 0144 03/28/22 0206 03/29/22 0140  BUN 62* 69* 78* 82* 83*  CREATININE 5.70* 5.82* 6.51* 7.00* 6.81*  K 3.9 3.9 3.9 4.0 4.0  MG 1.6* 1.8  --   --   --  AKI on CKD 4 Chronic metabolic acidosis due to CKD Baseline creatinine 4.63 from January.  Presented with creatinine elevated to 6.12, gradually improved. However, with aggressive diuresis, it seems patient's creatinine started to go up.  It peaked at 7.  Lasix was held.   Creatinine better at 6.81 today.  Nephrology consult with Dr. Moshe Cipro appreciated.  Patient seems to be heading towards dialysis.  However he does not have uremic symptoms at this time.  He needs close follow-up with nephrology as an outpatient. Continue sodium bicarb 650 mg twice daily Follows up with [facility] as an outpatient Recent Labs    01/25/22 0500 03/21/22 1745 03/22/22 0316 03/23/22 0228 03/24/22 0951 03/25/22 0314 03/26/22 0146 03/27/22 0144 03/28/22 0206 03/29/22 0140  BUN 54* 64* 63* 66* 61* 62* 69* 78* 82* 83*  CREATININE 4.63* 6.12* 5.91* 5.78* 5.81* 5.70* 5.82* 6.51* 7.00* 6.81*  CO2 22 17* 16* 18* 18* 19* 19* 21* 20* 21*   Demand ischemia On presentation, reported chest pain.  Troponin was mildly elevated as well but stabilized - 55>57. Symptoms and elevated troponin are likely due to CHF in the setting of severe CKD Chest pain was also reproducible on palpation. PTA on Plavix 75 mg daily, Crestor 10 mg daily Continue the same.  BPH Continue finasteride 5 mg daily and Flomax 0.4 mg daily   Type 2 diabetes mellitus A1c 7.4 on 03/22/2022 PTA on Lantus 30 units nightly.  However his blood sugars currently controlled on sliding scale only.   I am not sure if he was really compliant to Lantus at home.  His insulin requirement could have been going down because of improvement in renal function as well.  Recommend to resume Lantus only at 5 units nightly.  May need to escalate the dose based on blood sugar response.  Recent Labs  Lab 03/28/22 1228 03/28/22 1701 03/28/22 2054 03/29/22 0810 03/29/22 1141  GLUCAP 128* 97 133* 120* 129*   Anemia of chronic disease Hemoglobin low but stable. Continues to get Aranesp with nephrology.  Per nephrology recommendation, 1 dose of IV iron given today. Recent Labs    06/09/21 0934 07/03/21 1110 03/24/22 0951 03/25/22 0314 03/26/22 0146 03/27/22 0144 03/28/22 0206 03/29/22 0140  HGB  --    < > 9.7* 8.9*  9.4* 9.2* 9.1*  --   MCV  --    < > 89.8 89.0 88.4 86.1 86.8  --   FERRITIN 295  --   --   --   --   --   --  192  TIBC 190*  --   --   --   --   --   --  216*  IRON 15*  --   --   --   --   --   --  39*   < > = values in this interval not displayed.    Shoulder Pain Chronic arthritis Degenerative changes of AC joint Currently on pain control with as needed oxycodone  Peripheral neuropathy He was previously on Neurontin 300 mg 3 times daily which he states he is no longer taking because of CKD.    Persistent left lower extremity pain, left hip pain 3/11, MRI lumbar spine was obtained to look for the cause of acute worsening of back pain.  It showed L4-L5 mild to moderate right and mild left neuroforaminal narrowing probably affecting the descending L5 nerve roots.  It also showed L5-S1 left subarticular annular fissure and mild multilevel facet arthropathy.  I discussed the result with patient.  No need of surgical intervention at this time. Will plan to continue physical therapy and as needed pain meds. Currently on scheduled Tylenol, as needed oxycodone as needed IV Dilaudid. Continue Tylenol at home.  Given prescription for few days of oral oxycodone.  Constipation Reports no bowel movement in several days.  He was offered oral MiraLAX and enema which he refused.  Post discharge, will continue Senokot daily, Dulcolax daily for 5 days and MiraLAX as needed.  Mobility: Home with PT recommended by PT  Goals of care   Code Status: Full Code   Wounds:  -    Discharge Exam:   Vitals:   03/28/22 1229 03/28/22 2046 03/29/22 0342 03/29/22 0807  BP: (!) 156/78 (!) 152/76 129/82 (!) 155/77  Pulse: 74 74 69 69  Resp: '16 16 17 19  '$ Temp: 98 F (36.7 C) 97.7 F (36.5 C) 97.9 F (36.6 C) 98.1 F (36.7 C)  TempSrc: Oral Oral Oral Oral  SpO2: 99% 100% 98% 100%  Weight:   96.8 kg   Height:        Body mass index is 27.4 kg/m.  General exam: Pleasant, middle-aged African-American  male.  In moderate distress because of lower back pain and left lower extremity pain. Skin: No rashes, lesions or ulcers. HEENT: Atraumatic, normocephalic, no obvious bleeding Lungs: Clear to auscultation bilaterally CVS: Regular rate and rhythm, no murmur GI/Abd soft, nontender, nondistended, bowel sound present CNS: Alert, awake, oriented x 3 Psychiatry: Mood appropriate Extremities: No pedal edema, no calf tenderness  Follow ups:    Follow-up Information     [facility] [place] Follow up.   Specialty: Home Health Services Why: Physical Therapy-Occupational Therapy- Registered Nurse-offic to call with visit times. Contact information: [address] [place] [address] [phone]         [name], MD Follow up.   Specialty: Internal Medicine Contact information: [address] [place] [address] [phone]                 Discharge Instructions:   Discharge Instructions     Call MD for:  difficulty breathing, headache or visual disturbances   Complete by: As directed    Call MD for:  extreme fatigue   Complete by: As directed    Call MD for:  hives   Complete by: As directed    Call MD for:  persistant dizziness or light-headedness   Complete by: As directed    Call MD for:  persistant nausea and vomiting   Complete by: As directed    Call MD for:  severe uncontrolled pain   Complete by: As directed    Call MD for:  temperature >100.4   Complete by: As directed    Diet - low sodium heart healthy   Complete by: As directed    Diet Carb Modified   Complete by: As directed    Discharge instructions   Complete by: As directed    Recommendations at discharge:   Lasix has been resumed at 80 mg twice daily.  Follow-up with nephrologist as an outpatient for renal function monitoring and consideration of dialysis  Continue regular bowel regimen at home.  You have not been requiring long-acting insulin here  in the hospital.  Resume Lantus only at 5 units daily.  Continue to escalate the dose based on blood sugar response.  Discharge instructions for diabetes mellitus: Check blood sugar  3 times a day and bedtime at home. If blood sugar running above 200 or less than 70 please call your MD to adjust insulin. If you notice signs and symptoms of hypoglycemia (low blood sugar) like jitteriness, confusion, thirst, tremor and sweating, please check blood sugar, drink sugary drink/biscuits/sweets to increase sugar level and call MD or return to ER.    General discharge instructions: Follow with Primary MD [name], MD in 7 days  Please request your PCP  to go over your hospital tests, procedures, radiology results at the follow up. Please get your medicines reviewed and adjusted.  Your PCP may decide to repeat certain labs or tests as needed. Do not drive, operate heavy machinery, perform activities at heights, swimming or participation in water activities or provide baby sitting services if your were admitted for syncope or siezures until you have seen by Primary MD or a Neurologist and advised to do so again. [place] Controlled Substance Reporting System database was reviewed. Do not drive, operate heavy machinery, perform activities at heights, swim, participate in water activities or provide baby-sitting services while on medications for pain, sleep and mood until your outpatient physician has reevaluated you and advised to do so again.  You are strongly recommended to comply with the dose, frequency and duration of prescribed medications. Activity: As tolerated with Full fall precautions use walker/cane & assistance as needed Avoid using any recreational substances like cigarette, tobacco, alcohol, or non-prescribed drug. If you experience worsening of your admission symptoms, develop shortness of breath, life threatening emergency, suicidal or homicidal thoughts you must seek medical  attention immediately by calling 911 or calling your MD immediately  if symptoms less severe. You must read complete instructions/literature along with all the possible adverse reactions/side effects for all the medicines you take and that have been prescribed to you. Take any new medicine only after you have completely understood and accepted all the possible adverse reactions/side effects.  Wear Seat belts while driving. You were cared for by a hospitalist during your hospital stay. If you have any questions about your discharge medications or the care you received while you were in the hospital after you are discharged, you can call the unit and ask to speak with the hospitalist or the covering physician. Once you are discharged, your primary care physician will handle any further medical issues. Please note that NO REFILLS for any discharge medications will be authorized once you are discharged, as it is imperative that you return to your primary care physician (or establish a relationship with a primary care physician if you do not have one).   Increase activity slowly   Complete by: As directed        Discharge Medications:   Allergies as of 03/29/2022       Reactions   Claritin [loratadine] Swelling   Joint swelling   Hydrochlorothiazide Other (See Comments)   Dizziness   Latex Hives, Swelling   Lyrica [pregabalin] Other (See Comments)   Depression. "Makes me loopy"    Metformin And Related Diarrhea, Nausea And Vomiting        Medication List     STOP taking these medications    gabapentin 300 MG capsule Commonly known as: NEURONTIN   hydrALAZINE 100 MG tablet Commonly known as: APRESOLINE       TAKE these medications    acetaminophen 500 MG tablet Commonly known as: TYLENOL Take 1,000 mg by mouth daily as needed for mild pain or headache.  amLODipine 10 MG tablet Commonly known as: NORVASC Take 1 tablet (10 mg total) by mouth daily.   bisacodyl 5 MG EC  tablet Commonly known as: Dulcolax Take 1 tablet (5 mg total) by mouth daily for 5 days.   calcitRIOL 0.25 MCG capsule Commonly known as: ROCALTROL Take 0.25 mcg by mouth daily.   clopidogrel 75 MG tablet Commonly known as: PLAVIX Take 1 tablet (75 mg total) by mouth daily.   famotidine 40 MG tablet Commonly known as: PEPCID Take 40 mg by mouth daily.   finasteride 5 MG tablet Commonly known as: PROSCAR Take 1 tablet (5 mg total) by mouth daily.   furosemide 80 MG tablet Commonly known as: Lasix Take 1 tablet (80 mg total) by mouth 2 (two) times daily.   Lantus SoloStar 100 UNIT/ML Solostar Pen Generic drug: insulin glargine Inject 5 Units into the skin at bedtime. What changed: how much to take   lidocaine 4 % Place 1 patch onto the skin daily as needed for up to 28 days (pain).   meclizine 25 MG tablet Commonly known as: ANTIVERT Take 25 mg by mouth daily as needed for dizziness.   metoprolol succinate 25 MG 24 hr tablet Commonly known as: TOPROL-XL Take 0.5 tablets (12.5 mg total) by mouth daily. Start taking on: March 30, 2022 What changed:  medication strength how much to take when to take this additional instructions   oxyCODONE 5 MG immediate release tablet Commonly known as: Oxy IR/ROXICODONE Take 1 tablet (5 mg total) by mouth every 8 (eight) hours as needed for up to 5 days for severe pain.   polyethylene glycol 17 g packet Commonly known as: MIRALAX / GLYCOLAX Take 17 g by mouth daily. Start taking on: March 30, 2022   rosuvastatin 10 MG tablet Commonly known as: CRESTOR Take 10 mg by mouth daily.   senna-docusate 8.6-50 MG tablet Commonly known as: Senokot-S Take 1 tablet by mouth at bedtime.   sodium bicarbonate 650 MG tablet Take 650 mg by mouth 2 (two) times daily.   tamsulosin 0.4 MG Caps capsule Commonly known as: FLOMAX Take 0.4 mg by mouth daily.   Vitamin D3 1.25 MG (50000 UT) capsule Generic drug: Cholecalciferol Take 50,000  Units by mouth every 7 (seven) days.         The results of significant diagnostics from this hospitalization (including imaging, microbiology, ancillary and laboratory) are listed below for reference.    Procedures and Diagnostic Studies:   DG Shoulder 1V Right  Result Date: 03/22/2022 CLINICAL DATA:  Right shoulder pain.  No known accident or injury. EXAM: RIGHT SHOULDER - 1 VIEW COMPARISON:  Right shoulder radiographs 12/10/2020. FINDINGS: Single limited frontal view of the right shoulder. There are again small peripheral acromioclavicular degenerative spurs. No acute fracture is seen. No dislocation. The visualized portion of the right lung is unremarkable. IMPRESSION: Mild degenerative changes of the acromioclavicular joint. Electronically Signed   By: Yvonne Kendall M.D.   On: 03/22/2022 17:48   DG Chest Portable 1 View  Result Date: 03/21/2022 CLINICAL DATA:  Shortness of breath. EXAM: PORTABLE CHEST 1 VIEW COMPARISON:  Chest radiograph dated 01/24/2022 and CT dated 01/24/2022. FINDINGS: Mild cardiomegaly with mild vascular congestion. No focal consolidation, pleural effusion, pneumothorax. No acute osseous pathology. IMPRESSION: Mild cardiomegaly with mild vascular congestion. Electronically Signed   By: Anner Crete M.D.   On: 03/21/2022 20:17     Labs:   Basic Metabolic Panel: Recent Labs  Lab 03/23/22 0228 03/24/22  [id number] 03/25/22 0314 03/26/22 0146 03/27/22 0144 03/28/22 0206 03/29/22 0140  NA 139   < > 138 136 135 137 137  K 4.1   < > 3.9 3.9 3.9 4.0 4.0  CL 108   < > 106 105 103 104 106  CO2 18*   < > 19* 19* 21* 20* 21*  GLUCOSE 100*   < > 103* 87 126* 126* 124*  BUN 66*   < > 62* 69* 78* 82* 83*  CREATININE 5.78*   < > 5.70* 5.82* 6.51* 7.00* 6.81*  CALCIUM 8.4*   < > 8.5* 8.6* 8.4* 8.3* 8.2*  MG  --   --  1.6* 1.8  --   --   --   PHOS 5.3*  --  5.9* 6.4*  --   --  7.3*   < > = values in this interval not displayed.   GFR Estimated Creatinine Clearance:  13.2 mL/min (A) (by C-G formula based on SCr of 6.81 mg/dL (H)). Liver Function Tests: Recent Labs  Lab 03/23/22 0228 03/24/22 0951 03/25/22 0314 03/29/22 0140  AST  --  14* 12*  --   ALT  --  9 9  --   ALKPHOS  --  61 54  --   BILITOT  --  0.6 0.3  --   PROT  --  7.5 7.0  --   ALBUMIN 2.7* 3.2* 2.9* 2.9*   No results for input(s): "LIPASE", "AMYLASE" in the last 168 hours. No results for input(s): "AMMONIA" in the last 168 hours. Coagulation profile No results for input(s): "INR", "PROTIME" in the last 168 hours.  CBC: Recent Labs  Lab 03/24/22 0951 03/25/22 0314 03/26/22 0146 03/27/22 0144 03/28/22 0206  WBC 6.2 6.3 5.8 6.2 5.7  NEUTROABS 4.1 3.9  --  3.9 3.4  HGB 9.7* 8.9* 9.4* 9.2* 9.1*  HCT 29.1* 26.8* 27.5* 26.6* 26.4*  MCV 89.8 89.0 88.4 86.1 86.8  PLT 336 317 324 343 380   Cardiac Enzymes: No results for input(s): "CKTOTAL", "CKMB", "CKMBINDEX", "TROPONINI" in the last 168 hours. BNP: Invalid input(s): "POCBNP" CBG: Recent Labs  Lab 03/28/22 1228 03/28/22 1701 03/28/22 2054 03/29/22 0810 03/29/22 1141  GLUCAP 128* 97 133* 120* 129*   D-Dimer No results for input(s): "DDIMER" in the last 72 hours. Hgb A1c No results for input(s): "HGBA1C" in the last 72 hours. Lipid Profile No results for input(s): "CHOL", "HDL", "LDLCALC", "TRIG", "CHOLHDL", "LDLDIRECT" in the last 72 hours. Thyroid function studies No results for input(s): "TSH", "T4TOTAL", "T3FREE", "THYROIDAB" in the last 72 hours.  Invalid input(s): "FREET3" Anemia work up Recent Labs    03/29/22 0140  FERRITIN 192  TIBC 216*  IRON 72*   Microbiology Recent Results (from the past 240 hour(s))  Urine Culture (for pregnant, neutropenic or urologic patients or patients with an indwelling urinary catheter)     Status: Abnormal   Collection Time: 03/21/22  7:22 PM   Specimen: Urine, Clean Catch  Result Value Ref Range Status   Specimen Description URINE, CLEAN CATCH  Final   Special  Requests   Final    NONE Performed at [facility], [place] [address] [place], [place] [address]    Culture (A)  Final    >=100,000 COLONIES/mL KLEBSIELLA PNEUMONIAE Confirmed Extended Spectrum Beta-Lactamase Producer (ESBL).  In bloodstream infections from ESBL organisms, carbapenems are preferred over piperacillin/tazobactam. They are shown to have a lower risk of mortality.    Report Status 03/24/2022 FINAL  Final  Organism ID, Bacteria KLEBSIELLA PNEUMONIAE (A)  Final      Susceptibility   Klebsiella pneumoniae - MIC*    AMPICILLIN >=32 RESISTANT Resistant     CEFAZOLIN >=64 RESISTANT Resistant     CEFEPIME >=32 RESISTANT Resistant     CEFTRIAXONE >=64 RESISTANT Resistant     CIPROFLOXACIN >=4 RESISTANT Resistant     GENTAMICIN <=1 SENSITIVE Sensitive     IMIPENEM 0.5 SENSITIVE Sensitive     NITROFURANTOIN 64 INTERMEDIATE Intermediate     TRIMETH/SULFA >=320 RESISTANT Resistant     AMPICILLIN/SULBACTAM >=32 RESISTANT Resistant     PIP/TAZO 8 SENSITIVE Sensitive     * >=100,000 COLONIES/mL KLEBSIELLA PNEUMONIAE  Resp panel by RT-PCR (RSV, Flu A&B, Covid) Anterior Nasal Swab     Status: None   Collection Time: 03/22/22 11:37 AM   Specimen: Anterior Nasal Swab  Result Value Ref Range Status   SARS Coronavirus 2 by RT PCR NEGATIVE NEGATIVE Final   Influenza A by PCR NEGATIVE NEGATIVE Final   Influenza B by PCR NEGATIVE NEGATIVE Final    Comment: (NOTE) The Xpert Xpress SARS-CoV-2/FLU/RSV plus assay is intended as an aid in the diagnosis of influenza from Nasopharyngeal swab specimens and should not be used as a sole basis for treatment. Nasal washings and aspirates are unacceptable for Xpert Xpress SARS-CoV-2/FLU/RSV testing.  Fact Sheet for Patients: EntrepreneurPulse.com.au  Fact Sheet for Healthcare Providers: IncredibleEmployment.be  This test is not yet approved or cleared by the [place] FDA and has been authorized for  detection and/or diagnosis of SARS-CoV-2 by FDA under an Emergency Use Authorization (EUA). This EUA will remain in effect (meaning this test can be used) for the duration of the COVID-19 declaration under Section 564(b)(1) of the Act, 21 U.S.C. section 360bbb-3(b)(1), unless the authorization is terminated or revoked.     Resp Syncytial Virus by PCR NEGATIVE NEGATIVE Final    Comment: (NOTE) Fact Sheet for Patients: EntrepreneurPulse.com.au  Fact Sheet for Healthcare Providers: IncredibleEmployment.be  This test is not yet approved or cleared by the [place] FDA and has been authorized for detection and/or diagnosis of SARS-CoV-2 by FDA under an Emergency Use Authorization (EUA). This EUA will remain in effect (meaning this test can be used) for the duration of the COVID-19 declaration under Section 564(b)(1) of the Act, 21 U.S.C. section 360bbb-3(b)(1), unless the authorization is terminated or revoked.  Performed at [facility], [place] [address] [place], [place] [address]   Respiratory (~20 pathogens) panel by PCR     Status: None   Collection Time: 03/22/22  4:00 PM   Specimen: Nasopharyngeal Swab; Respiratory  Result Value Ref Range Status   Adenovirus NOT DETECTED NOT DETECTED Final   Coronavirus 229E NOT DETECTED NOT DETECTED Final    Comment: (NOTE) The Coronavirus on the Respiratory Panel, DOES NOT test for the novel  Coronavirus (2019 nCoV)    Coronavirus HKU1 NOT DETECTED NOT DETECTED Final   Coronavirus NL63 NOT DETECTED NOT DETECTED Final   Coronavirus OC43 NOT DETECTED NOT DETECTED Final   Metapneumovirus NOT DETECTED NOT DETECTED Final   Rhinovirus / Enterovirus NOT DETECTED NOT DETECTED Final   Influenza A NOT DETECTED NOT DETECTED Final   Influenza B NOT DETECTED NOT DETECTED Final   Parainfluenza Virus 1 NOT DETECTED NOT DETECTED Final   Parainfluenza Virus 2 NOT DETECTED NOT DETECTED Final   Parainfluenza  Virus 3 NOT DETECTED NOT DETECTED Final   Parainfluenza Virus 4 NOT DETECTED NOT DETECTED Final  Respiratory Syncytial Virus NOT DETECTED NOT DETECTED Final   Bordetella pertussis NOT DETECTED NOT DETECTED Final   Bordetella Parapertussis NOT DETECTED NOT DETECTED Final   Chlamydophila pneumoniae NOT DETECTED NOT DETECTED Final   Mycoplasma pneumoniae NOT DETECTED NOT DETECTED Final    Comment: Performed at [facility], [place] [address] [place], [place] [address]    Time coordinating discharge: 45 minutes  Signed: Taunia Frasco  [facility] 03/29/2022, 1:43 PM

## 2022-04-02 NOTE — Discharge Summary (Signed)
Physician Discharge Summary  Ryan Jenkins [id number] DOB: 1960/10/28 DOA: 03/21/2022  PCP: [name], MD  Admit date: 03/21/2022 Discharge date: 04/02/2022  Admitted From: Home Discharge disposition: Home with home health  Recommendations at discharge:  Lasix has been resumed at 80 mg twice daily. Follow-up with nephrologist as an outpatient for renal function monitoring and consideration of dialysis Continue regular bowel regimen at home. You have not been requiring long-acting insulin here in the hospital.  Resume Lantus only at 5 units daily.  Continue to escalate the dose based on blood sugar response.  Brief narrative: Ryan Jenkins is a 62 y.o. male with PMH significant for DM2, HTN, HLD, CAD, chronic combined systolic and diastolic CHF, CKD 4, diabetic neuropathy, GERD, chronic anemia, anxiety/depression, arthritis, BPH, vitamin B12 deficiency, impaired mobility. 3/6, patient presented to the ED with complaint of cloudy foul-smelling urine for few weeks, nausea, vomiting, intermittent nonproductive cough and pleuritic pain.  Also reported a fever of 102 prior to presentation. In the ED, patient was noted to have O2 sat low at 88%, required 2 L oxygen nasal cannula Chest x-ray showed mild pulm edema Urinalysis was positive for pyuria Labs with creatinine elevated to 6.12 from a baseline of 4.6, serum bicarb low at 17, hemoglobin low at 9.4 BNP greater than 1100, troponin elevated to 60 Patient was given 1 dose of IV Lasix 40 mg, started on IV antibiotic coverage with IV Rocephin Admitted to [facility]  Subjective: Patient was seen and examined this morning.  Sitting up in recliner.  Not in distress.  Pain improving.  No BM in 3 days but refused MiraLAX and enema.  Hospital course: UTI with ESBL Klebsiella pneumonia Presented with foul-smelling urine, nausea, vomiting.  Reported fever at home. Urine culture sent on admission grew more than 100,000 CFU per mL of  Klebsiella pneumoniae On chart review, I noted that patient had grown ESBL Klebsiella in his previous cultures from November and December 2023.  In the absence of symptoms, he was not treated at that time.  However, this admission, patient had true symptoms of UTI and hence he was started on IV meropenem.  Completed 7-day course of IV meropenem.  Afebrile, WBC count normal no antibiotics needed at discharge Recent Labs  Lab 03/27/22 0144 03/28/22 0206  WBC 6.2 5.7    Acute exacerbation of chronic combined systolic and diastolic CHF On presentation, had cough, shortness of breath, pleuritic chest pain,  Chest x-ray with mild pulm edema, labs with elevated BNP, elevated troponin Last echo from December 2023 with EF 50 to 55%, global hypokinesis, moderate LVH, grade 2 diastolic dysfunction, mildly enlarged right ventricular size and mildly reduced right ventricular function PTA, he was on Toprol 50 mg daily, amlodipine 10 mg daily and Lasix 80 mg twice daily.  Reports compliance Patient was started on IV Lasix and later switched to oral Lasix 80 mg twice daily.  Also on Toprol 12.5 g daily and amlodipine 10 mg daily. He had more than 4.5 L of negative balance this hospitalization. Euvolemic at this time.  Lasix was held transiently because of worsening creatinine.  Per nephrology recommendation, I would put him back on Lasix 80 mg twice daily at discharge. Recent Labs  Lab 03/27/22 0144 03/28/22 0206 03/29/22 0140  BUN 78* 82* 83*  CREATININE 6.51* 7.00* 6.81*  K 3.9 4.0 4.0    AKI on CKD 4 Chronic metabolic acidosis due to CKD Baseline creatinine 4.63 from January.  Presented with creatinine  elevated to 6.12, gradually improved. However, with aggressive diuresis, it seems patient's creatinine started to go up.  It peaked at 7.  Lasix was held.  Creatinine better at 6.81 today.  Nephrology consult with Dr. Moshe Cipro appreciated.  Patient seems to be heading towards dialysis.  However  he does not have uremic symptoms at this time.  He needs close follow-up with nephrology as an outpatient. Continue sodium bicarb 650 mg twice daily Follows up with [facility] as an outpatient Recent Labs    01/25/22 0500 03/21/22 1745 03/22/22 0316 03/23/22 0228 03/24/22 0951 03/25/22 0314 03/26/22 0146 03/27/22 0144 03/28/22 0206 03/29/22 0140  BUN 54* 64* 63* 66* 61* 62* 69* 78* 82* 83*  CREATININE 4.63* 6.12* 5.91* 5.78* 5.81* 5.70* 5.82* 6.51* 7.00* 6.81*  CO2 22 17* 16* 18* 18* 19* 19* 21* 20* 21*    Demand ischemia On presentation, reported chest pain.  Troponin was mildly elevated as well but stabilized - 55>57. Symptoms and elevated troponin are likely due to CHF in the setting of severe CKD Chest pain was also reproducible on palpation. PTA on Plavix 75 mg daily, Crestor 10 mg daily Continue the same.  BPH Continue finasteride 5 mg daily and Flomax 0.4 mg daily   Type 2 diabetes mellitus A1c 7.4 on 03/22/2022 PTA on Lantus 30 units nightly.  However his blood sugars currently controlled on sliding scale only.   I am not sure if he was really compliant to Lantus at home.  His insulin requirement could have been going down because of improvement in renal function as well.  Recommend to resume Lantus only at 5 units nightly.  May need to escalate the dose based on blood sugar response.  Recent Labs  Lab 03/28/22 1701 03/28/22 2054 03/29/22 0810 03/29/22 1141 03/29/22 1601  GLUCAP 97 133* 120* 129* 106*    Anemia of chronic disease Hemoglobin low but stable. Continues to get Aranesp with nephrology.  Per nephrology recommendation, 1 dose of IV iron given today. Recent Labs    06/09/21 0934 07/03/21 1110 03/24/22 0951 03/25/22 0314 03/26/22 0146 03/27/22 0144 03/28/22 0206 03/29/22 0140  HGB  --    < > 9.7* 8.9* 9.4* 9.2* 9.1*  --   MCV  --    < > 89.8 89.0 88.4 86.1 86.8  --   FERRITIN 295  --   --   --   --   --   --  192  TIBC 190*  --   --   --    --   --   --  216*  IRON 15*  --   --   --   --   --   --  39*   < > = values in this interval not displayed.     Shoulder Pain Chronic arthritis Degenerative changes of AC joint Currently on pain control with as needed oxycodone  Peripheral neuropathy He was previously on Neurontin 300 mg 3 times daily which he states he is no longer taking because of CKD.    Persistent left lower extremity pain, left hip pain 3/11, MRI lumbar spine was obtained to look for the cause of acute worsening of back pain.  It showed L4-L5 mild to moderate right and mild left neuroforaminal narrowing probably affecting the descending L5 nerve roots.  It also showed L5-S1 left subarticular annular fissure and mild multilevel facet arthropathy.  I discussed the result with patient.  No need of surgical intervention at this time.  Will plan to continue physical therapy and as needed pain meds. Currently on scheduled Tylenol, as needed oxycodone as needed IV Dilaudid. Continue Tylenol at home.  Given prescription for few days of oral oxycodone.  Constipation Reports no bowel movement in several days.  He was offered oral MiraLAX and enema which he refused.  Post discharge, will continue Senokot daily, Dulcolax daily for 5 days and MiraLAX as needed.  Mobility: Home with PT recommended by PT  Goals of care   Code Status: Prior   Wounds:  -    Discharge Exam:   Vitals:   03/28/22 1229 03/28/22 2046 03/29/22 0342 03/29/22 0807  BP: (!) 156/78 (!) 152/76 129/82 (!) 155/77  Pulse: 74 74 69 69  Resp: 16 16 17 19   Temp: 98 F (36.7 C) 97.7 F (36.5 C) 97.9 F (36.6 C) 98.1 F (36.7 C)  TempSrc: Oral Oral Oral Oral  SpO2: 99% 100% 98% 100%  Weight:   96.8 kg   Height:        Body mass index is 27.4 kg/m.  General exam: Pleasant, middle-aged African-American male.  In moderate distress because of lower back pain and left lower extremity pain. Skin: No rashes, lesions or ulcers. HEENT: Atraumatic,  normocephalic, no obvious bleeding Lungs: Clear to auscultation bilaterally CVS: Regular rate and rhythm, no murmur GI/Abd soft, nontender, nondistended, bowel sound present CNS: Alert, awake, oriented x 3 Psychiatry: Mood appropriate Extremities: No pedal edema, no calf tenderness  Follow ups:    Follow-up Information     [facility].   Specialty: Home Health Services Why: Physical Therapy-Occupational Therapy- Registered Nurse-offic to call with visit times. Contact information: [address] [place] [address] [phone]         [name], MD Follow up.   Specialty: Internal Medicine Contact information: [address] [place] [address] [phone]                 Discharge Instructions:   Discharge Instructions     Call MD for:  difficulty breathing, headache or visual disturbances   Complete by: As directed    Call MD for:  extreme fatigue   Complete by: As directed    Call MD for:  hives   Complete by: As directed    Call MD for:  persistant dizziness or light-headedness   Complete by: As directed    Call MD for:  persistant nausea and vomiting   Complete by: As directed    Call MD for:  severe uncontrolled pain   Complete by: As directed    Call MD for:  temperature >100.4   Complete by: As directed    Diet - low sodium heart healthy   Complete by: As directed    Diet Carb Modified   Complete by: As directed    Discharge instructions   Complete by: As directed    Recommendations at discharge:   Lasix has been resumed at 80 mg twice daily.  Follow-up with nephrologist as an outpatient for renal function monitoring and consideration of dialysis  Continue regular bowel regimen at home.  You have not been requiring long-acting insulin here in the hospital.  Resume Lantus only at 5 units daily.  Continue to escalate the dose based on blood sugar response.  Discharge instructions  for diabetes mellitus: Check blood sugar 3 times a day and bedtime at home. If blood sugar running above 200 or less  than 70 please call your MD to adjust insulin. If you notice signs and symptoms of hypoglycemia (low blood sugar) like jitteriness, confusion, thirst, tremor and sweating, please check blood sugar, drink sugary drink/biscuits/sweets to increase sugar level and call MD or return to ER.    General discharge instructions: Follow with Primary MD [name], MD in 7 days  Please request your PCP  to go over your hospital tests, procedures, radiology results at the follow up. Please get your medicines reviewed and adjusted.  Your PCP may decide to repeat certain labs or tests as needed. Do not drive, operate heavy machinery, perform activities at heights, swimming or participation in water activities or provide baby sitting services if your were admitted for syncope or siezures until you have seen by Primary MD or a Neurologist and advised to do so again. [place] Controlled Substance Reporting System database was reviewed. Do not drive, operate heavy machinery, perform activities at heights, swim, participate in water activities or provide baby-sitting services while on medications for pain, sleep and mood until your outpatient physician has reevaluated you and advised to do so again.  You are strongly recommended to comply with the dose, frequency and duration of prescribed medications. Activity: As tolerated with Full fall precautions use walker/cane & assistance as needed Avoid using any recreational substances like cigarette, tobacco, alcohol, or non-prescribed drug. If you experience worsening of your admission symptoms, develop shortness of breath, life threatening emergency, suicidal or homicidal thoughts you must seek medical attention immediately by calling 911 or calling your MD immediately  if symptoms less severe. You must read complete instructions/literature along  with all the possible adverse reactions/side effects for all the medicines you take and that have been prescribed to you. Take any new medicine only after you have completely understood and accepted all the possible adverse reactions/side effects.  Wear Seat belts while driving. You were cared for by a hospitalist during your hospital stay. If you have any questions about your discharge medications or the care you received while you were in the hospital after you are discharged, you can call the unit and ask to speak with the hospitalist or the covering physician. Once you are discharged, your primary care physician will handle any further medical issues. Please note that NO REFILLS for any discharge medications will be authorized once you are discharged, as it is imperative that you return to your primary care physician (or establish a relationship with a primary care physician if you do not have one).   Increase activity slowly   Complete by: As directed        Discharge Medications:   Allergies as of 03/29/2022       Reactions   Claritin [loratadine] Swelling   Joint swelling   Hydrochlorothiazide Other (See Comments)   Dizziness   Latex Hives, Swelling   Lyrica [pregabalin] Other (See Comments)   Depression. "Makes me loopy"    Metformin And Related Diarrhea, Nausea And Vomiting        Medication List     STOP taking these medications    gabapentin 300 MG capsule Commonly known as: NEURONTIN   hydrALAZINE 100 MG tablet Commonly known as: APRESOLINE       TAKE these medications    acetaminophen 500 MG tablet Commonly known as: TYLENOL Take 1,000 mg by mouth daily as needed for mild pain or headache.   amLODipine 10 MG tablet Commonly known as: NORVASC Take 1 tablet (10 mg total) by  mouth daily.   bisacodyl 5 MG EC tablet Commonly known as: Dulcolax Take 1 tablet (5 mg total) by mouth daily for 5 days.   calcitRIOL 0.25 MCG capsule Commonly known as:  ROCALTROL Take 0.25 mcg by mouth daily.   clopidogrel 75 MG tablet Commonly known as: PLAVIX Take 1 tablet (75 mg total) by mouth daily.   famotidine 40 MG tablet Commonly known as: PEPCID Take 40 mg by mouth daily.   finasteride 5 MG tablet Commonly known as: PROSCAR Take 1 tablet (5 mg total) by mouth daily.   furosemide 80 MG tablet Commonly known as: Lasix Take 1 tablet (80 mg total) by mouth 2 (two) times daily.   Lantus SoloStar 100 UNIT/ML Solostar Pen Generic drug: insulin glargine Inject 5 Units into the skin at bedtime. What changed: how much to take   lidocaine 4 % Place 1 patch onto the skin daily as needed for up to 28 days (pain).   meclizine 25 MG tablet Commonly known as: ANTIVERT Take 25 mg by mouth daily as needed for dizziness.   metoprolol succinate 25 MG 24 hr tablet Commonly known as: TOPROL-XL Take 0.5 tablets (12.5 mg total) by mouth daily. What changed:  medication strength how much to take when to take this additional instructions   oxyCODONE 5 MG immediate release tablet Commonly known as: Oxy IR/ROXICODONE Take 1 tablet (5 mg total) by mouth every 8 (eight) hours as needed for up to 5 days for severe pain.   polyethylene glycol 17 g packet Commonly known as: MIRALAX / GLYCOLAX Take 17 g by mouth daily.   rosuvastatin 10 MG tablet Commonly known as: CRESTOR Take 10 mg by mouth daily.   senna-docusate 8.6-50 MG tablet Commonly known as: Senokot-S Take 1 tablet by mouth at bedtime.   sodium bicarbonate 650 MG tablet Take 650 mg by mouth 2 (two) times daily.   tamsulosin 0.4 MG Caps capsule Commonly known as: FLOMAX Take 0.4 mg by mouth daily.   Vitamin D3 1.25 MG (50000 UT) capsule Generic drug: Cholecalciferol Take 50,000 Units by mouth every 7 (seven) days.         The results of significant diagnostics from this hospitalization (including imaging, microbiology, ancillary and laboratory) are listed below for reference.     Procedures and Diagnostic Studies:   DG Shoulder 1V Right  Result Date: 03/22/2022 CLINICAL DATA:  Right shoulder pain.  No known accident or injury. EXAM: RIGHT SHOULDER - 1 VIEW COMPARISON:  Right shoulder radiographs 12/10/2020. FINDINGS: Single limited frontal view of the right shoulder. There are again small peripheral acromioclavicular degenerative spurs. No acute fracture is seen. No dislocation. The visualized portion of the right lung is unremarkable. IMPRESSION: Mild degenerative changes of the acromioclavicular joint. Electronically Signed   By: Yvonne Kendall M.D.   On: 03/22/2022 17:48   DG Chest Portable 1 View  Result Date: 03/21/2022 CLINICAL DATA:  Shortness of breath. EXAM: PORTABLE CHEST 1 VIEW COMPARISON:  Chest radiograph dated 01/24/2022 and CT dated 01/24/2022. FINDINGS: Mild cardiomegaly with mild vascular congestion. No focal consolidation, pleural effusion, pneumothorax. No acute osseous pathology. IMPRESSION: Mild cardiomegaly with mild vascular congestion. Electronically Signed   By: Anner Crete M.D.   On: 03/21/2022 20:17     Labs:   Basic Metabolic Panel: Recent Labs  Lab 03/27/22 0144 03/28/22 0206 03/29/22 0140  NA 135 137 137  K 3.9 4.0 4.0  CL 103 104 106  CO2 21* 20* 21*  GLUCOSE 126* 126*  124*  BUN 78* 82* 83*  CREATININE 6.51* 7.00* 6.81*  CALCIUM 8.4* 8.3* 8.2*  PHOS  --   --  7.3*    GFR Estimated Creatinine Clearance: 13.2 mL/min (A) (by C-G formula based on SCr of 6.81 mg/dL (H)). Liver Function Tests: Recent Labs  Lab 03/29/22 0140  ALBUMIN 2.9*    No results for input(s): "LIPASE", "AMYLASE" in the last 168 hours. No results for input(s): "AMMONIA" in the last 168 hours. Coagulation profile No results for input(s): "INR", "PROTIME" in the last 168 hours.  CBC: Recent Labs  Lab 03/27/22 0144 03/28/22 0206  WBC 6.2 5.7  NEUTROABS 3.9 3.4  HGB 9.2* 9.1*  HCT 26.6* 26.4*  MCV 86.1 86.8  PLT 343 380    Cardiac  Enzymes: No results for input(s): "CKTOTAL", "CKMB", "CKMBINDEX", "TROPONINI" in the last 168 hours. BNP: Invalid input(s): "POCBNP" CBG: Recent Labs  Lab 03/28/22 1701 03/28/22 2054 03/29/22 0810 03/29/22 1141 03/29/22 1601  GLUCAP 97 133* 120* 129* 106*    D-Dimer No results for input(s): "DDIMER" in the last 72 hours. Hgb A1c No results for input(s): "HGBA1C" in the last 72 hours. Lipid Profile No results for input(s): "CHOL", "HDL", "LDLCALC", "TRIG", "CHOLHDL", "LDLDIRECT" in the last 72 hours. Thyroid function studies No results for input(s): "TSH", "T4TOTAL", "T3FREE", "THYROIDAB" in the last 72 hours.  Invalid input(s): "FREET3" Anemia work up No results for input(s): "VITAMINB12", "FOLATE", "FERRITIN", "TIBC", "IRON", "RETICCTPCT" in the last 72 hours.  Microbiology No results found for this or any previous visit (from the past 240 hour(s)).   Time coordinating discharge: 45 minutes  Signed: Willistine Ferrall  [facility] 04/02/2022, 4:54 PM

## 2022-04-04 ENCOUNTER — Emergency Department (HOSPITAL_COMMUNITY): Payer: Medicaid Other

## 2022-04-04 ENCOUNTER — Observation Stay (HOSPITAL_COMMUNITY)
Admission: EM | Admit: 2022-04-04 | Discharge: 2022-04-09 | Disposition: A | Discharge status: Home Health | Payer: Medicaid Other | Attending: Family Medicine | Admitting: Family Medicine

## 2022-04-04 DIAGNOSIS — N179 Acute kidney failure, unspecified: Secondary | ICD-10-CM | POA: Diagnosis present

## 2022-04-04 DIAGNOSIS — Z9841 Cataract extraction status, right eye: Secondary | ICD-10-CM | POA: Diagnosis not present

## 2022-04-04 DIAGNOSIS — I5042 Chronic combined systolic (congestive) and diastolic (congestive) heart failure: Secondary | ICD-10-CM | POA: Diagnosis present

## 2022-04-04 DIAGNOSIS — M1990 Unspecified osteoarthritis, unspecified site: Secondary | ICD-10-CM | POA: Diagnosis not present

## 2022-04-04 DIAGNOSIS — Z8051 Family history of malignant neoplasm of kidney: Secondary | ICD-10-CM

## 2022-04-04 DIAGNOSIS — F419 Anxiety disorder, unspecified: Secondary | ICD-10-CM | POA: Diagnosis present

## 2022-04-04 DIAGNOSIS — B9629 Other Escherichia coli [E. coli] as the cause of diseases classified elsewhere: Secondary | ICD-10-CM | POA: Diagnosis present

## 2022-04-04 DIAGNOSIS — I5032 Chronic diastolic (congestive) heart failure: Secondary | ICD-10-CM

## 2022-04-04 DIAGNOSIS — R8271 Bacteriuria: Secondary | ICD-10-CM | POA: Diagnosis present

## 2022-04-04 DIAGNOSIS — E1122 Type 2 diabetes mellitus with diabetic chronic kidney disease: Secondary | ICD-10-CM | POA: Diagnosis present

## 2022-04-04 DIAGNOSIS — N138 Other obstructive and reflux uropathy: Secondary | ICD-10-CM | POA: Diagnosis present

## 2022-04-04 DIAGNOSIS — N401 Enlarged prostate with lower urinary tract symptoms: Secondary | ICD-10-CM | POA: Diagnosis present

## 2022-04-04 DIAGNOSIS — E1140 Type 2 diabetes mellitus with diabetic neuropathy, unspecified: Secondary | ICD-10-CM | POA: Diagnosis not present

## 2022-04-04 DIAGNOSIS — E11319 Type 2 diabetes mellitus with unspecified diabetic retinopathy without macular edema: Secondary | ICD-10-CM | POA: Diagnosis not present

## 2022-04-04 DIAGNOSIS — N390 Urinary tract infection, site not specified: Secondary | ICD-10-CM | POA: Diagnosis present

## 2022-04-04 DIAGNOSIS — Z79899 Other long term (current) drug therapy: Secondary | ICD-10-CM

## 2022-04-04 DIAGNOSIS — Z1612 Extended spectrum beta lactamase (ESBL) resistance: Secondary | ICD-10-CM

## 2022-04-04 DIAGNOSIS — Z8249 Family history of ischemic heart disease and other diseases of the circulatory system: Secondary | ICD-10-CM

## 2022-04-04 DIAGNOSIS — Z794 Long term (current) use of insulin: Secondary | ICD-10-CM | POA: Diagnosis not present

## 2022-04-04 DIAGNOSIS — K3184 Gastroparesis: Secondary | ICD-10-CM | POA: Diagnosis present

## 2022-04-04 DIAGNOSIS — Z9842 Cataract extraction status, left eye: Secondary | ICD-10-CM | POA: Diagnosis not present

## 2022-04-04 DIAGNOSIS — Z7409 Other reduced mobility: Secondary | ICD-10-CM | POA: Insufficient documentation

## 2022-04-04 DIAGNOSIS — N400 Enlarged prostate without lower urinary tract symptoms: Secondary | ICD-10-CM | POA: Insufficient documentation

## 2022-04-04 DIAGNOSIS — N3000 Acute cystitis without hematuria: Secondary | ICD-10-CM

## 2022-04-04 DIAGNOSIS — K219 Gastro-esophageal reflux disease without esophagitis: Secondary | ICD-10-CM | POA: Diagnosis present

## 2022-04-04 DIAGNOSIS — F32A Depression, unspecified: Secondary | ICD-10-CM | POA: Diagnosis not present

## 2022-04-04 DIAGNOSIS — Z1152 Encounter for screening for COVID-19: Secondary | ICD-10-CM

## 2022-04-04 DIAGNOSIS — D631 Anemia in chronic kidney disease: Secondary | ICD-10-CM | POA: Diagnosis not present

## 2022-04-04 DIAGNOSIS — Z9079 Acquired absence of other genital organ(s): Secondary | ICD-10-CM

## 2022-04-04 DIAGNOSIS — Z7902 Long term (current) use of antithrombotics/antiplatelets: Secondary | ICD-10-CM | POA: Diagnosis not present

## 2022-04-04 DIAGNOSIS — Z888 Allergy status to other drugs, medicaments and biological substances status: Secondary | ICD-10-CM

## 2022-04-04 DIAGNOSIS — N184 Chronic kidney disease, stage 4 (severe): Secondary | ICD-10-CM | POA: Insufficient documentation

## 2022-04-04 DIAGNOSIS — I10 Essential (primary) hypertension: Secondary | ICD-10-CM | POA: Diagnosis present

## 2022-04-04 DIAGNOSIS — Z961 Presence of intraocular lens: Secondary | ICD-10-CM | POA: Diagnosis present

## 2022-04-04 DIAGNOSIS — Z832 Family history of diseases of the blood and blood-forming organs and certain disorders involving the immune mechanism: Secondary | ICD-10-CM

## 2022-04-04 DIAGNOSIS — I2510 Atherosclerotic heart disease of native coronary artery without angina pectoris: Secondary | ICD-10-CM | POA: Diagnosis present

## 2022-04-04 DIAGNOSIS — R3914 Feeling of incomplete bladder emptying: Secondary | ICD-10-CM | POA: Diagnosis not present

## 2022-04-04 DIAGNOSIS — E1143 Type 2 diabetes mellitus with diabetic autonomic (poly)neuropathy: Secondary | ICD-10-CM | POA: Diagnosis not present

## 2022-04-04 DIAGNOSIS — I130 Hypertensive heart and chronic kidney disease with heart failure and stage 1 through stage 4 chronic kidney disease, or unspecified chronic kidney disease: Secondary | ICD-10-CM | POA: Diagnosis not present

## 2022-04-04 DIAGNOSIS — R112 Nausea with vomiting, unspecified: Secondary | ICD-10-CM | POA: Diagnosis not present

## 2022-04-04 DIAGNOSIS — E785 Hyperlipidemia, unspecified: Secondary | ICD-10-CM | POA: Diagnosis present

## 2022-04-04 DIAGNOSIS — Z823 Family history of stroke: Secondary | ICD-10-CM

## 2022-04-04 DIAGNOSIS — Z91040 Latex allergy status: Secondary | ICD-10-CM

## 2022-04-04 DIAGNOSIS — Z806 Family history of leukemia: Secondary | ICD-10-CM

## 2022-04-04 DIAGNOSIS — Z800 Family history of malignant neoplasm of digestive organs: Secondary | ICD-10-CM

## 2022-04-04 DIAGNOSIS — I951 Orthostatic hypotension: Secondary | ICD-10-CM | POA: Diagnosis not present

## 2022-04-04 DIAGNOSIS — E119 Type 2 diabetes mellitus without complications: Secondary | ICD-10-CM

## 2022-04-04 DIAGNOSIS — Z8481 Family history of carrier of genetic disease: Secondary | ICD-10-CM

## 2022-04-04 LAB — CBC WITH DIFFERENTIAL/PLATELET
Abs Immature Granulocytes: 0.02 10*3/uL (ref 0.00–0.07)
Basophils Absolute: 0.1 10*3/uL (ref 0.0–0.1)
Basophils Relative: 1 %
Eosinophils Absolute: 0.1 10*3/uL (ref 0.0–0.5)
Eosinophils Relative: 2 %
HCT: 36.2 % — ABNORMAL LOW (ref 39.0–52.0)
Hemoglobin: 11.8 g/dL — ABNORMAL LOW (ref 13.0–17.0)
Immature Granulocytes: 0 %
Lymphocytes Relative: 23 %
Lymphs Abs: 1.5 10*3/uL (ref 0.7–4.0)
MCH: 29.6 pg (ref 26.0–34.0)
MCHC: 32.6 g/dL (ref 30.0–36.0)
MCV: 91 fL (ref 80.0–100.0)
Monocytes Absolute: 0.6 10*3/uL (ref 0.1–1.0)
Monocytes Relative: 9 %
Neutro Abs: 4.2 10*3/uL (ref 1.7–7.7)
Neutrophils Relative %: 65 %
Platelets: 516 10*3/uL — ABNORMAL HIGH (ref 150–400)
RBC: 3.98 MIL/uL — ABNORMAL LOW (ref 4.22–5.81)
RDW: 14.6 % (ref 11.5–15.5)
WBC: 6.6 10*3/uL (ref 4.0–10.5)
nRBC: 0 % (ref 0.0–0.2)

## 2022-04-04 LAB — RESP PANEL BY RT-PCR (RSV, FLU A&B, COVID)  RVPGX2
Influenza A by PCR: NEGATIVE
Influenza B by PCR: NEGATIVE
Resp Syncytial Virus by PCR: NEGATIVE
SARS Coronavirus 2 by RT PCR: NEGATIVE

## 2022-04-04 LAB — URINALYSIS, ROUTINE W REFLEX MICROSCOPIC
Bilirubin Urine: NEGATIVE
Glucose, UA: NEGATIVE mg/dL
Ketones, ur: NEGATIVE mg/dL
Nitrite: NEGATIVE
Protein, ur: >=300 mg/dL — AB
Specific Gravity, Urine: 1.011 (ref 1.005–1.030)
pH: 6 (ref 5.0–8.0)

## 2022-04-04 LAB — COMPREHENSIVE METABOLIC PANEL
ALT: 8 U/L (ref 0–44)
AST: 14 U/L — ABNORMAL LOW (ref 15–41)
Albumin: 3.9 g/dL (ref 3.5–5.0)
Alkaline Phosphatase: 60 U/L (ref 38–126)
Anion gap: 6 (ref 5–15)
BUN: 40 mg/dL — ABNORMAL HIGH (ref 8–23)
CO2: 23 mmol/L (ref 22–32)
Calcium: 8.6 mg/dL — ABNORMAL LOW (ref 8.9–10.3)
Chloride: 109 mmol/L (ref 98–111)
Creatinine, Ser: 4.17 mg/dL — ABNORMAL HIGH (ref 0.61–1.24)
GFR, Estimated: 15 mL/min — ABNORMAL LOW (ref >60)
Glucose, Bld: 132 mg/dL — ABNORMAL HIGH (ref 70–99)
Potassium: 4 mmol/L (ref 3.5–5.1)
Sodium: 138 mmol/L (ref 135–145)
Total Bilirubin: 0.7 mg/dL (ref 0.3–1.2)
Total Protein: 8.3 g/dL — ABNORMAL HIGH (ref 6.5–8.1)

## 2022-04-04 LAB — TROPONIN I (HIGH SENSITIVITY)
Troponin I (High Sensitivity): 37 ng/L — ABNORMAL HIGH (ref <18)
Troponin I (High Sensitivity): 36 ng/L — ABNORMAL HIGH (ref <18)

## 2022-04-04 LAB — BRAIN NATRIURETIC PEPTIDE: B Natriuretic Peptide: 503.7 pg/mL — ABNORMAL HIGH (ref 0.0–100.0)

## 2022-04-04 LAB — LIPASE, BLOOD: Lipase: 32 U/L (ref 11–51)

## 2022-04-04 LAB — CBG MONITORING, ED: Glucose-Capillary: 117 mg/dL — ABNORMAL HIGH (ref 70–99)

## 2022-04-04 MED ORDER — CLOPIDOGREL BISULFATE 75 MG PO TABS
75.0000 mg | ORAL_TABLET | Freq: Every day | ORAL | Status: DC
  Administered 2022-04-05 – 2022-04-09 (×5): 75 mg via ORAL
  Filled 2022-04-04 (×5): qty 1

## 2022-04-04 MED ORDER — AMLODIPINE BESYLATE 10 MG PO TABS
10.0000 mg | ORAL_TABLET | Freq: Every day | ORAL | Status: DC
  Administered 2022-04-05 – 2022-04-09 (×6): 10 mg via ORAL
  Filled 2022-04-04 (×5): qty 1
  Filled 2022-04-04: qty 2

## 2022-04-04 MED ORDER — FAMOTIDINE 20 MG PO TABS: 40.0000 mg | ORAL_TABLET | Freq: Every day | ORAL | Status: DC

## 2022-04-04 MED ORDER — HEPARIN SODIUM (PORCINE) 5000 UNIT/ML IJ SOLN
5000.0000 [IU] | Freq: Three times a day (TID) | INTRAMUSCULAR | Status: DC
  Administered 2022-04-05 – 2022-04-09 (×12): 5000 [IU] via SUBCUTANEOUS
  Filled 2022-04-04 (×13): qty 1

## 2022-04-04 MED ORDER — ONDANSETRON HCL 4 MG/2ML IJ SOLN
4.0000 mg | Freq: Four times a day (QID) | INTRAMUSCULAR | Status: DC
  Administered 2022-04-05 – 2022-04-07 (×8): 4 mg via INTRAVENOUS
  Filled 2022-04-04 (×9): qty 2

## 2022-04-04 MED ORDER — SODIUM CHLORIDE 0.9 % IV SOLN
500.0000 mg | Freq: Two times a day (BID) | INTRAVENOUS | Status: DC
  Administered 2022-04-05 (×2): 500 mg via INTRAVENOUS
  Filled 2022-04-04 (×3): qty 10

## 2022-04-04 MED ORDER — SENNOSIDES-DOCUSATE SODIUM 8.6-50 MG PO TABS
1.0000 | ORAL_TABLET | Freq: Every day | ORAL | Status: DC
  Administered 2022-04-05 – 2022-04-08 (×4): 1 via ORAL
  Filled 2022-04-04 (×5): qty 1

## 2022-04-04 MED ORDER — ACETAMINOPHEN 650 MG RE SUPP: 650.0000 mg | Freq: Four times a day (QID) | RECTAL | Status: DC | PRN

## 2022-04-04 MED ORDER — SODIUM CHLORIDE 0.9 % IV SOLN
12.5000 mg | Freq: Four times a day (QID) | INTRAVENOUS | Status: DC | PRN
  Filled 2022-04-04: qty 0.5

## 2022-04-04 MED ORDER — SODIUM CHLORIDE 0.9 % IV BOLUS
500.0000 mL | Freq: Once | INTRAVENOUS | Status: AC
  Administered 2022-04-04: 500 mL via INTRAVENOUS

## 2022-04-04 MED ORDER — SODIUM BICARBONATE 650 MG PO TABS
650.0000 mg | ORAL_TABLET | Freq: Two times a day (BID) | ORAL | Status: DC
  Administered 2022-04-05 – 2022-04-09 (×9): 650 mg via ORAL
  Filled 2022-04-04 (×10): qty 1

## 2022-04-04 MED ORDER — ACETAMINOPHEN 325 MG PO TABS
650.0000 mg | ORAL_TABLET | Freq: Four times a day (QID) | ORAL | Status: DC | PRN
  Administered 2022-04-05 – 2022-04-08 (×2): 650 mg via ORAL
  Filled 2022-04-04 (×2): qty 2

## 2022-04-04 MED ORDER — SENNOSIDES-DOCUSATE SODIUM 8.6-50 MG PO TABS: 1.0000 | ORAL_TABLET | Freq: Every evening | ORAL | Status: DC | PRN

## 2022-04-04 MED ORDER — FINASTERIDE 5 MG PO TABS
5.0000 mg | ORAL_TABLET | Freq: Every day | ORAL | Status: DC
  Administered 2022-04-05 – 2022-04-09 (×5): 5 mg via ORAL
  Filled 2022-04-04 (×5): qty 1

## 2022-04-04 MED ORDER — INSULIN ASPART 100 UNIT/ML IJ SOLN
0.0000 [IU] | INTRAMUSCULAR | Status: DC
  Administered 2022-04-05 (×2): 2 [IU] via SUBCUTANEOUS
  Filled 2022-04-04: qty 0.15

## 2022-04-04 MED ORDER — CALCITRIOL 0.25 MCG PO CAPS
0.2500 ug | ORAL_CAPSULE | Freq: Every day | ORAL | Status: DC
  Administered 2022-04-05 – 2022-04-09 (×5): 0.25 ug via ORAL
  Filled 2022-04-04 (×5): qty 1

## 2022-04-04 MED ORDER — MELATONIN 3 MG PO TABS
3.0000 mg | ORAL_TABLET | Freq: Every evening | ORAL | Status: DC | PRN
  Administered 2022-04-05 – 2022-04-08 (×4): 3 mg via ORAL
  Filled 2022-04-04 (×4): qty 1

## 2022-04-04 MED ORDER — ROSUVASTATIN CALCIUM 10 MG PO TABS
10.0000 mg | ORAL_TABLET | Freq: Every day | ORAL | Status: DC
  Administered 2022-04-05 – 2022-04-09 (×5): 10 mg via ORAL
  Filled 2022-04-04 (×5): qty 1

## 2022-04-04 MED ORDER — ONDANSETRON HCL 4 MG/2ML IJ SOLN
4.0000 mg | Freq: Once | INTRAMUSCULAR | Status: AC
  Administered 2022-04-04: 4 mg via INTRAVENOUS
  Filled 2022-04-04: qty 2

## 2022-04-04 MED ORDER — METOPROLOL SUCCINATE ER 25 MG PO TB24
12.5000 mg | ORAL_TABLET | Freq: Every day | ORAL | Status: DC
  Administered 2022-04-05 – 2022-04-09 (×6): 12.5 mg via ORAL
  Filled 2022-04-04 (×6): qty 1

## 2022-04-04 MED ORDER — FUROSEMIDE 40 MG PO TABS
80.0000 mg | ORAL_TABLET | Freq: Two times a day (BID) | ORAL | Status: DC
  Administered 2022-04-05 – 2022-04-07 (×6): 80 mg via ORAL
  Filled 2022-04-04 (×7): qty 2

## 2022-04-04 MED ORDER — ALUM & MAG HYDROXIDE-SIMETH 200-200-20 MG/5ML PO SUSP
30.0000 mL | Freq: Four times a day (QID) | ORAL | Status: DC | PRN
  Administered 2022-04-05: 30 mL via ORAL
  Filled 2022-04-04: qty 30

## 2022-04-04 MED ORDER — ALUM & MAG HYDROXIDE-SIMETH 200-200-20 MG/5ML PO SUSP
30.0000 mL | Freq: Once | ORAL | Status: AC
  Administered 2022-04-05: 30 mL via ORAL
  Filled 2022-04-04: qty 30

## 2022-04-04 MED ORDER — TAMSULOSIN HCL 0.4 MG PO CAPS
0.4000 mg | ORAL_CAPSULE | Freq: Every day | ORAL | Status: DC
  Administered 2022-04-05 – 2022-04-09 (×5): 0.4 mg via ORAL
  Filled 2022-04-04 (×5): qty 1

## 2022-04-04 NOTE — H&P (Incomplete)
PCP:   [name], MD   Chief Complaint:  Nausea, vomiting  HPI: This is a 62y/o male with PMHx significant for DM2, HTN, HLD, CAD, chronic combined systolic and diastolic CHF, CKD 4, diabetic neuropathy, GERD, chronic anemia, anxiety/depression, arthritis, BPH, vitamin B12 deficiency, impaired mobility.  He was recently admitted 3/6-318 with diagnosis of ESBL Klebsiella pneumonia and mild CHF exacerbation.  Treated with 7 days IV meropenem, discharged 2 days ago. Of note patient grown ESBL Klebsiella in previous cultures 11/2021 and 12/2021. As patient was asymptomatic, these were not treated at that time.    Per patient he is nausea and vomiting resumed the day after discharge.  This been ongoing the last 3 days.  He denies fever, chills, burning urination or hematuria.  He additional denies abdominal pain.  He endorses mild lightheadedness and dizziness and states he has not eaten or taken any home medications in the past 3 days due to his nausea.  In the ER patient's blood pressure during interview was 191/119.  He denies any neurological symptoms, headaches, localized weakness, except nausea and vomiting.  Review of Systems:  The patient denies anorexia, fever, weight loss,, vision loss, decreased hearing, hoarseness, chest pain, syncope, dyspnea on exertion, peripheral edema, balance deficits, hemoptysis, abdominal pain, melena, hematochezia, hematuria, incontinence, genital sores, muscle weakness, suspicious skin lesions, transient blindness, difficulty walking, depression, unusual weight change, abnormal bleeding, enlarged lymph nodes, angioedema, and breast masses. Positives: Nausea, vomiting, mild lightheadedness and dizziness, indigestion.  Past Medical History: Past Medical History:  Diagnosis Date   Anemia    Anginal pain ([place])    Anxiety    Arthritis    BPH with obstruction/lower urinary tract symptoms    CAD (coronary artery disease)    cardiologist--- dr Junius Roads badal;   11-06-2019 cardiac cath in [place] (result in care everywhere)  nonobstructive cad involing pRCA 60% (done in setting worseing CHF/ acute pulmonary edema requiring intubation/ AKI   Chronic combined systolic and diastolic CHF (congestive heart failure) ([place])    Chronic kidney disease, stage IV (severe) ([place])    nephrologist--- dr Carolin Sicks   Depression    Diabetic neuropathy ([place])    Dyspnea    Edema of both lower extremities    GERD (gastroesophageal reflux disease)    Heart murmur    History of community acquired pneumonia    admission 06-04-2021 in [facility]  w/ ARF hypoxia w/ severe sepsis   Hyperlipidemia    Hypertension    Insulin dependent type 2 diabetes mellitus ([place])    Pneumonia    Retinopathy due to secondary diabetes ([place])    Uses walker    Vitamin B12 deficiency    Vitreous hemorrhage [facility])    Past Surgical History:  Procedure Laterality Date   CARDIAC CATHETERIZATION  11/06/2019   [place] in [place];    nonobstructive CAD , pRCA 60% (result in care everywhere)   CATARACT EXTRACTION W/ INTRAOCULAR LENS IMPLANT Bilateral 2017   TRANSURETHRAL RESECTION OF PROSTATE N/A 10/16/2021   Procedure: TRANSURETHRAL RESECTION OF THE PROSTATE (TURP);  Surgeon: Janith Lima, MD;  Location: [facility];  Service: Urology;  Laterality: N/A;    Medications: Prior to Admission medications   Medication Sig Start Date End Date Taking? Authorizing Provider  acetaminophen (TYLENOL) 500 MG tablet Take 1,000 mg by mouth daily as needed for mild pain or headache.    [provider]  amLODipine (NORVASC) 10 MG tablet Take 1 tablet (10 mg total) by mouth daily.  02/08/21   Danford, Suann Larry, MD  calcitRIOL (ROCALTROL) 0.25 MCG capsule Take 0.25 mcg by mouth daily. 01/01/22   [provider]  Cholecalciferol (VITAMIN D3) 1.25 MG (50000 UT) CAPS Take 50,000 Units by mouth every 7 (seven) days. 09/12/21   [provider]  clopidogrel (PLAVIX) 75 MG tablet  Take 1 tablet (75 mg total) by mouth daily. 07/05/21   de Yolanda Manges, Cortney E, NP  famotidine (PEPCID) 40 MG tablet Take 40 mg by mouth daily. 08/08/21   [provider]  finasteride (PROSCAR) 5 MG tablet Take 1 tablet (5 mg total) by mouth daily. 07/20/20   Charlott Rakes, MD  furosemide (LASIX) 80 MG tablet Take 1 tablet (80 mg total) by mouth 2 (two) times daily. 03/29/22 06/27/22  Dahal, Marlowe Aschoff, MD  LANTUS SOLOSTAR 100 UNIT/ML Solostar Pen Inject 5 Units into the skin at bedtime. 03/29/22   Terrilee Croak, MD  lidocaine 4 % Place 1 patch onto the skin daily as needed for up to 28 days (pain). 03/29/22 04/26/22  Terrilee Croak, MD  meclizine (ANTIVERT) 25 MG tablet Take 25 mg by mouth daily as needed for dizziness. 08/08/21   [provider]  metoprolol succinate (TOPROL-XL) 25 MG 24 hr tablet Take 0.5 tablets (12.5 mg total) by mouth daily. 03/30/22 06/28/22  Dahal, Marlowe Aschoff, MD  polyethylene glycol (MIRALAX / GLYCOLAX) 17 g packet Take 17 g by mouth daily. 03/30/22   Terrilee Croak, MD  rosuvastatin (CRESTOR) 10 MG tablet Take 10 mg by mouth daily.    [provider]  senna-docusate (SENOKOT-S) 8.6-50 MG tablet Take 1 tablet by mouth at bedtime. 03/29/22 06/27/22  Terrilee Croak, MD  sodium bicarbonate 650 MG tablet Take 650 mg by mouth 2 (two) times daily. 09/20/21   [provider]  tamsulosin (FLOMAX) 0.4 MG CAPS capsule Take 0.4 mg by mouth daily. 05/25/21   [provider]    Allergies:   Allergies  Allergen Reactions   Claritin [Loratadine] Swelling    Joint swelling    Hydrochlorothiazide Other (See Comments)    Dizziness   Latex Hives and Swelling   Lyrica [Pregabalin] Other (See Comments)    Depression. "Makes me loopy"    Metformin And Related Diarrhea and Nausea And Vomiting    Social History:  reports that he has never smoked. He has never used smokeless tobacco. He reports that he does not drink alcohol and does not use drugs.  Family  History: Family History  Problem Relation Age of Onset   Renal cancer Mother    Hypertension Mother    Pancreatic cancer Mother    Hypertension Sister    Stroke Sister    Leukemia Maternal Uncle    Sickle cell trait Maternal Aunt    Colon cancer Neg Hx    Esophageal cancer Neg Hx    Rectal cancer Neg Hx    Stomach cancer Neg Hx     Physical Exam: Vitals:   04/04/22 1912 04/04/22 2000 04/04/22 2100 04/04/22 2200  BP:  (!) 204/118 (!) 197/102 (!) 186/107  Pulse:  86 80 90  Resp:  18 18 18   Temp: 97.6 F (36.4 C)     TempSrc: Oral     SpO2:  100% 100% 100%  Weight:      Height:        General:  Alert and oriented times three, well developed and nourished, no acute distress Eyes: PERRLA, pink conjunctiva, no scleral icterus ENT: Moist oral mucosa, neck  supple, no thyromegaly Lungs: clear to ascultation, no wheeze, no crackles, no use of accessory muscles Cardiovascular: regular rate and rhythm, no regurgitation, no gallops, no murmurs. No carotid bruits, no JVD Abdomen: soft, positive BS, non-tender, non-distended, no organomegaly, not an acute abdomen GU: not examined Neuro: CN II - XII grossly intact, sensation intact Musculoskeletal: strength 5/5 all extremities, no clubbing, cyanosis or edema Skin: no rash, no subcutaneous crepitation, no decubitus Psych: appropriate patient   Labs on Admission:  Recent Labs    04/04/22 1434  NA 138  K 4.0  CL 109  CO2 23  GLUCOSE 132*  BUN 40*  CREATININE 4.17*  CALCIUM 8.6*   Recent Labs    04/04/22 1434  AST 14*  ALT 8  ALKPHOS 60  BILITOT 0.7  PROT 8.3*  ALBUMIN 3.9   Recent Labs    04/04/22 1434  LIPASE 32   Recent Labs    04/04/22 1434  WBC 6.6  NEUTROABS 4.2  HGB 11.8*  HCT 36.2*  MCV 91.0  PLT 516*     Micro Results: Recent Results (from the past 240 hour(s))  Resp panel by RT-PCR (RSV, Flu A&B, Covid) Anterior Nasal Swab     Status: None   Collection Time: 04/04/22  4:58 PM   Specimen:  Anterior Nasal Swab  Result Value Ref Range Status   SARS Coronavirus 2 by RT PCR NEGATIVE NEGATIVE Final    Comment: (NOTE) SARS-CoV-2 target nucleic acids are NOT DETECTED.  The SARS-CoV-2 RNA is generally detectable in upper respiratory specimens during the acute phase of infection. The lowest concentration of SARS-CoV-2 viral copies this assay can detect is 138 copies/mL. A negative result does not preclude SARS-Cov-2 infection and should not be used as the sole basis for treatment or other patient management decisions. A negative result may occur with  improper specimen collection/handling, submission of specimen other than nasopharyngeal swab, presence of viral mutation(s) within the areas targeted by this assay, and inadequate number of viral copies(<138 copies/mL). A negative result must be combined with clinical observations, patient history, and epidemiological information. The expected result is Negative.  Fact Sheet for Patients:  EntrepreneurPulse.com.au  Fact Sheet for Healthcare Providers:  IncredibleEmployment.be  This test is no t yet approved or cleared by the [place] FDA and  has been authorized for detection and/or diagnosis of SARS-CoV-2 by FDA under an Emergency Use Authorization (EUA). This EUA will remain  in effect (meaning this test can be used) for the duration of the COVID-19 declaration under Section 564(b)(1) of the Act, 21 U.S.C.section 360bbb-3(b)(1), unless the authorization is terminated  or revoked sooner.       Influenza A by PCR NEGATIVE NEGATIVE Final   Influenza B by PCR NEGATIVE NEGATIVE Final    Comment: (NOTE) The Xpert Xpress SARS-CoV-2/FLU/RSV plus assay is intended as an aid in the diagnosis of influenza from Nasopharyngeal swab specimens and should not be used as a sole basis for treatment. Nasal washings and aspirates are unacceptable for Xpert Xpress SARS-CoV-2/FLU/RSV testing.  Fact  Sheet for Patients: EntrepreneurPulse.com.au  Fact Sheet for Healthcare Providers: IncredibleEmployment.be  This test is not yet approved or cleared by the [place] FDA and has been authorized for detection and/or diagnosis of SARS-CoV-2 by FDA under an Emergency Use Authorization (EUA). This EUA will remain in effect (meaning this test can be used) for the duration of the COVID-19 declaration under Section 564(b)(1) of the Act, 21 U.S.C. section 360bbb-3(b)(1), unless the authorization is terminated  or revoked.     Resp Syncytial Virus by PCR NEGATIVE NEGATIVE Final    Comment: (NOTE) Fact Sheet for Patients: EntrepreneurPulse.com.au  Fact Sheet for Healthcare Providers: IncredibleEmployment.be  This test is not yet approved or cleared by the [place] FDA and has been authorized for detection and/or diagnosis of SARS-CoV-2 by FDA under an Emergency Use Authorization (EUA). This EUA will remain in effect (meaning this test can be used) for the duration of the COVID-19 declaration under Section 564(b)(1) of the Act, 21 U.S.C. section 360bbb-3(b)(1), unless the authorization is terminated or revoked.  Performed at [facility], [place] [address], [place], [place] [address]      Radiological Exams on Admission: CT ABDOMEN PELVIS WO CONTRAST  Result Date: 04/04/2022 CLINICAL DATA:  Abdominal pain, acute, nonlocalized. EXAM: CT ABDOMEN AND PELVIS WITHOUT CONTRAST TECHNIQUE: Multidetector CT imaging of the abdomen and pelvis was performed following the standard protocol without IV contrast. RADIATION DOSE REDUCTION: This exam was performed according to the departmental dose-optimization program which includes automated exposure control, adjustment of the mA and/or kV according to patient size and/or use of iterative reconstruction technique. COMPARISON:  CT 10/31/2021 FINDINGS: Lower  chest: Lung bases are clear.  No pleural effusions. Hepatobiliary: Normal appearance of the liver and gallbladder. Pancreas: Unremarkable. No pancreatic ductal dilatation or surrounding inflammatory changes. Spleen: Normal in size without focal abnormality. Adrenals/Urinary Tract: Normal adrenal glands. Bilateral perinephric stranding is similar to the previous examination. Negative for kidney stones or hydronephrosis. Bladder is mildly distended with diffuse bladder wall thickening and this is similar to the previous examination. Stomach/Bowel: Normal appearance of the stomach. Normal appearance of the appendix without inflammatory changes. No evidence for bowel dilatation or focal bowel inflammation. Vascular/Lymphatic: Mild atherosclerotic disease in the aorta and visceral arteries without aortic aneurysm. Proximal femoral arteries are heavily calcified. No lymph node enlargement in the abdomen or pelvis. Reproductive: Changes along the anterior central aspect of prostate compatible with history a TURP procedure. Again noted are heavily calcified vas deferens. Other: Negative for free fluid. Small umbilical hernia containing fat. Negative for free air. Musculoskeletal: No acute bone abnormality. IMPRESSION: 1. No acute abnormality in the abdomen or pelvis. 2. Mild diffuse bladder wall thickening and bilateral perinephric stranding is similar to the previous examination. Negative for hydronephrosis or urinary stones. Bladder wall thickening could be related to chronic bladder outlet obstruction. The perinephric stranding is nonspecific and could also represent chronic changes. 3. Small umbilical hernia containing fat. 4. Aortic Atherosclerosis (ICD10-I70.0). Electronically Signed   By: Markus Daft M.D.   On: 04/04/2022 19:19   CT Head Wo Contrast  Result Date: 04/04/2022 CLINICAL DATA:  Headache. EXAM: CT HEAD WITHOUT CONTRAST TECHNIQUE: Contiguous axial images were obtained from the base of the skull through  the vertex without intravenous contrast. RADIATION DOSE REDUCTION: This exam was performed according to the departmental dose-optimization program which includes automated exposure control, adjustment of the mA and/or kV according to patient size and/or use of iterative reconstruction technique. COMPARISON:  July 09, 2021 FINDINGS: Brain: There is mild cerebral atrophy with widening of the extra-axial spaces and ventricular dilatation. There are areas of decreased attenuation within the white matter tracts of the supratentorial brain, consistent with microvascular disease changes. Vascular: No hyperdense vessel or unexpected calcification. Skull: Normal. Negative for fracture or focal lesion. Sinuses/Orbits: No acute finding. Other: None. IMPRESSION: 1. No acute intracranial abnormality. 2. Generalized cerebral atrophy and microvascular disease changes of the supratentorial brain. Electronically Signed  By: Virgina Norfolk M.D.   On: 04/04/2022 17:31   DG ABD ACUTE 2+V W 1V CHEST  Result Date: 04/04/2022 CLINICAL DATA:  Abdominal pain.  Vomiting EXAM: DG ABDOMEN ACUTE WITH 1 VIEW CHEST COMPARISON:  X-ray 03/24/2022 and CT chest.  Older exams as well FINDINGS: No consolidation, pneumothorax or effusion. No edema. Normal cardiopericardial silhouette. Overlapping cardiac leads. Gas is seen in nondilated loops of small and large bowel. No obstruction. Minimal small bowel gas. No obvious free air seen beneath the diaphragm on the upright view. Scattered colonic stool. Vas deferens calcifications in the pelvis. Separate vascular calcifications. IMPRESSION: Nonspecific bowel gas pattern with scattered stool. No acute cardiopulmonary disease Electronically Signed   By: Jill Side M.D.   On: 04/04/2022 15:40    Assessment/Plan Present on Admission: UTI, likely ESBL Klebsiella Pneumonia -Imipenem ordered. Failed recent meropenem 7day course -Antiemetics PRN -urine and blood cultures collected -IVF hydration  gentle, as patient with recent CHF exacerbation -ID consult per a.m. team   CKD (chronic kidney disease) stage 4, GFR 15-29 ml/min (HCC) -Avoid nephrotoxic medication -Creatinine improved compared to baseline.  Today's GFR 15, previous GFR 9 93/14/24) -Bicarb and Rocaltrol tablets resumed.   CAD/HFpEF -Metoprolol, Lasix, Norvasc, Plavix resumed   Type II Diabetes mellitus ([place]) -Sliding scale insulin and resumed -Lantus not initiated. FSBS low 100s.  Per record review, patient's glucose control has been steadily improving with improvement in renal function.  Lantus not resumed   Essential hypertension -Metoprolol, Norvasc presumed   HLD (hyperlipidemia) -Crestor resumed   BPH (benign prostatic hyperplasia) -Proscar and Flomax resumed  Vester Titsworth 04/04/2022, 10:38 PM

## 2022-04-04 NOTE — ED Notes (Signed)
Re-Drew Ryan Jenkins, sent to lab.

## 2022-04-04 NOTE — ED Provider Notes (Signed)
4:56 PM Assumed care of patient from off-going team. For more details, please see note from same day.  In brief, this is a 62 y.o. male who was recently admitted for ESBL UTI s/p 7 days of meropenem and CHF exacerbation p/w intractable N/V. Has DM but not in DKA. Has CKD, Cr 4 which is close to baseline, not particularly uremic. Lipase wnl.  Plan/Dispo at time of sign-out & ED Course since sign-out: [ ]  CTH, CT abdomen, urine  BP (!) 173/96   Pulse 77   Temp 97.6 F (36.4 C) (Oral)   Resp 12   Ht 6\' 2"  (1.88 m)   Wt 96.8 kg   SpO2 100%   BMI 27.40 kg/m    ED Course:   Clinical Course as of 04/19/22 1544  Wed Apr 04, 2022  1800 CT Head Wo Contrast 1. No acute intracranial abnormality. 2. Generalized cerebral atrophy and microvascular disease changes of the supratentorial brain.   [HN]  2033 CT ABDOMEN PELVIS WO CONTRAST 1. No acute abnormality in the abdomen or pelvis. 2. Mild diffuse bladder wall thickening and bilateral perinephric stranding is similar to the previous examination. Negative for hydronephrosis or urinary stones. Bladder wall thickening could be related to chronic bladder outlet obstruction. The perinephric stranding is nonspecific and could also represent chronic changes. 3. Small umbilical hernia containing fat. 4. Aortic Atherosclerosis (ICD10-I70.0).   [HN]  2117 Urinalysis, Routine w reflex microscopic -Urine, Clean Catch(!) +UTI, was recently treated for ESBL UTI [HN]  2156 Troponin I (High Sensitivity)(!): 36 Flat troponin [HN]  2200 Will consult to infectious disease [HN]    Clinical Course User Index [HN] Audley Hose, MD    Dispo: Plan to admit for IV abx possibly meropenem and called infectious disease. Hospitalist aware.  ------------------------------- Cindee Lame, MD Emergency Medicine  This note was created using dictation software, which may contain spelling or grammatical errors.   Audley Hose, MD 04/19/22 [phone]

## 2022-04-04 NOTE — ED Notes (Signed)
Pt is aware that we are in need of a urine sample. Pt stated he couldn't pee at this time, pt told to notified staff when he can urinate. Bed locked and in lowest position. Call light within reach and urinal @bedside .

## 2022-04-04 NOTE — Progress Notes (Signed)
Pharmacy Antibiotic Note  Ryan Jenkins is a 62 y.o. male admitted on 04/04/2022 with  PMH significant for DM2, HTN, HLD, CAD, chronic combined systolic and diastolic CHF, CKD 4, diabetic neuropathy, GERD, chronic anemia, anxiety/depression, arthritis, BPH, vitamin B12 deficiency, impaired mobility. Recent admission 1 week ago for ESBL UTI which was treated with 7 days of meropenem.  He was discharged 2 days ago. [name]  Pharmacy has been consulted for primaxin dosing.  Plan: Primaxin 500mg  IV q12h Follow renal function, cultures and clinical course  Height: 6\' 2"  (188 cm) Weight: 96.8 kg (213 lb 6.5 oz) IBW/kg (Calculated) : 82.2  Temp (24hrs), Avg:97.6 F (36.4 C), Min:97.6 F (36.4 C), Max:97.6 F (36.4 C)  Recent Labs  Lab 03/29/22 0140 04/04/22 1434  WBC  --  6.6  CREATININE 6.81* 4.17*    Estimated Creatinine Clearance: 21.6 mL/min (A) (by C-G formula based on SCr of 4.17 mg/dL (H)).    Allergies  Allergen Reactions   Claritin [Loratadine] Swelling    Joint swelling    Hydrochlorothiazide Other (See Comments)    Dizziness   Latex Hives and Swelling   Lyrica [Pregabalin] Other (See Comments)    Depression. "Makes me loopy"    Metformin And Related Diarrhea and Nausea And Vomiting    Antimicrobials this admission: 3/21 primaxin >>  Dose adjustments this admission:   Microbiology results: 3/20 BCx:  3/20 UCx:   Thank you for allowing pharmacy to be a part of this patient's care.  Dolly Rias RPh 04/04/2022, 11:19 PM

## 2022-04-04 NOTE — ED Provider Notes (Signed)
[place] [facility] Provider Note   CSN: [id number] Arrival date & time: 04/04/22  1400     History  Chief Complaint  Patient presents with   Emesis    Ryan Jenkins is a 62 y.o. male.   62 y.o. male with PMH significant for DM2, HTN, HLD, CAD, chronic combined systolic and diastolic CHF, CKD 4, diabetic neuropathy, GERD, chronic anemia, anxiety/depression, arthritis, BPH, vitamin B12 deficiency, impaired mobility. Recent admission 1 week ago for ESBL UTI which was treated with 7 days of meropenem.  He was discharged 2 days ago.  Reports he has been having nausea and vomiting for the past 3 days unable to keep down any food or medications.  Vomiting 2-3 times daily that is clear without blood streaks.  No diarrhea.  No fever.  No chest pain.  Some intermittent shortness of breath.  Complains of pain to his hands and feet consistent with his neuropathy and denies any new pain.  Denies any significant abdominal pain.  Has not had this issue in the past.  Still urinating normal amounts.  No known sick contacts.  He has been having intermittent headaches that come and go for the past several days but none currently.  Denies thunderclap onset of his headache.  Denies any chest pain or shortness of breath currently.  No black or bloody stools.  No blood in his emesis.  Denies any history of known gastroparesis.  The history is provided by the patient.  Emesis Associated symptoms: headaches   Associated symptoms: no abdominal pain, no arthralgias, no fever and no myalgias        Home Medications Prior to Admission medications   Medication Sig Start Date End Date Taking? Authorizing Provider  acetaminophen (TYLENOL) 500 MG tablet Take 1,000 mg by mouth daily as needed for mild pain or headache.    [provider]  amLODipine (NORVASC) 10 MG tablet Take 1 tablet (10 mg total) by mouth daily. 02/08/21   Danford, Suann Larry, MD  calcitRIOL  (ROCALTROL) 0.25 MCG capsule Take 0.25 mcg by mouth daily. 01/01/22   [provider]  Cholecalciferol (VITAMIN D3) 1.25 MG (50000 UT) CAPS Take 50,000 Units by mouth every 7 (seven) days. 09/12/21   [provider]  clopidogrel (PLAVIX) 75 MG tablet Take 1 tablet (75 mg total) by mouth daily. 07/05/21   de Yolanda Manges, Cortney E, NP  famotidine (PEPCID) 40 MG tablet Take 40 mg by mouth daily. 08/08/21   [provider]  finasteride (PROSCAR) 5 MG tablet Take 1 tablet (5 mg total) by mouth daily. 07/20/20   Charlott Rakes, MD  furosemide (LASIX) 80 MG tablet Take 1 tablet (80 mg total) by mouth 2 (two) times daily. 03/29/22 06/27/22  Dahal, Marlowe Aschoff, MD  LANTUS SOLOSTAR 100 UNIT/ML Solostar Pen Inject 5 Units into the skin at bedtime. 03/29/22   Terrilee Croak, MD  lidocaine 4 % Place 1 patch onto the skin daily as needed for up to 28 days (pain). 03/29/22 04/26/22  Terrilee Croak, MD  meclizine (ANTIVERT) 25 MG tablet Take 25 mg by mouth daily as needed for dizziness. 08/08/21   [provider]  metoprolol succinate (TOPROL-XL) 25 MG 24 hr tablet Take 0.5 tablets (12.5 mg total) by mouth daily. 03/30/22 06/28/22  Dahal, Marlowe Aschoff, MD  polyethylene glycol (MIRALAX / GLYCOLAX) 17 g packet Take 17 g by mouth daily. 03/30/22   Terrilee Croak, MD  rosuvastatin (CRESTOR) 10 MG tablet Take 10  mg by mouth daily.    [provider]  senna-docusate (SENOKOT-S) 8.6-50 MG tablet Take 1 tablet by mouth at bedtime. 03/29/22 06/27/22  Terrilee Croak, MD  sodium bicarbonate 650 MG tablet Take 650 mg by mouth 2 (two) times daily. 09/20/21   [provider]  tamsulosin (FLOMAX) 0.4 MG CAPS capsule Take 0.4 mg by mouth daily. 05/25/21   [provider]      Allergies    Claritin [loratadine], Hydrochlorothiazide, Latex, Lyrica [pregabalin], and Metformin and related    Review of Systems   Review of Systems  Constitutional:  Positive for activity change, appetite change and  fatigue. Negative for fever.  HENT:  Negative for congestion and rhinorrhea.   Respiratory:  Negative for chest tightness and shortness of breath.   Gastrointestinal:  Positive for nausea and vomiting. Negative for abdominal pain.  Genitourinary:  Negative for dysuria and hematuria.  Musculoskeletal:  Negative for arthralgias and myalgias.  Skin:  Negative for rash.  Neurological:  Positive for weakness, light-headedness and headaches.   all other systems are negative except as noted in the HPI and PMH.    Physical Exam Updated Vital Signs BP (!) 176/89 (BP Location: Right Arm)   Pulse 78   Temp 97.6 F (36.4 C) (Oral)   Resp 18   Ht 6\' 2"  (1.88 m)   Wt 96.8 kg   SpO2 100%   BMI 27.40 kg/m  Physical Exam Vitals and nursing note reviewed.  Constitutional:      General: He is not in acute distress.    Appearance: He is well-developed.  HENT:     Head: Normocephalic and atraumatic.     Mouth/Throat:     Mouth: Mucous membranes are dry.     Pharynx: No oropharyngeal exudate.  Eyes:     Conjunctiva/sclera: Conjunctivae normal.     Pupils: Pupils are equal, round, and reactive to light.  Neck:     Comments: No meningismus. Cardiovascular:     Rate and Rhythm: Normal rate and regular rhythm.     Heart sounds: Normal heart sounds. No murmur heard. Pulmonary:     Effort: Pulmonary effort is normal. No respiratory distress.     Breath sounds: Normal breath sounds.  Abdominal:     Palpations: Abdomen is soft.     Tenderness: There is no abdominal tenderness. There is no guarding or rebound.  Musculoskeletal:        General: No tenderness. Normal range of motion.     Cervical back: Normal range of motion and neck supple.  Skin:    General: Skin is warm.  Neurological:     Mental Status: He is alert and oriented to person, place, and time.     Cranial Nerves: No cranial nerve deficit.     Motor: No abnormal muscle tone.     Coordination: Coordination normal.     Comments:  No ataxia on finger to nose bilaterally. No pronator drift. 5/5 strength throughout. CN 2-12 intact.Equal grip strength. Sensation intact.   Psychiatric:        Behavior: Behavior normal.     ED Results / Procedures / Treatments   Labs (all labs ordered are listed, but only abnormal results are displayed) Labs Reviewed  CBC WITH DIFFERENTIAL/PLATELET - Abnormal; Notable for the following components:      Result Value   RBC 3.98 (*)    Hemoglobin 11.8 (*)    HCT 36.2 (*)    Platelets 516 (*)  All other components within normal limits  COMPREHENSIVE METABOLIC PANEL - Abnormal; Notable for the following components:   Glucose, Bld 132 (*)    BUN 40 (*)    Creatinine, Ser 4.17 (*)    Calcium 8.6 (*)    Total Protein 8.3 (*)    AST 14 (*)    GFR, Estimated 15 (*)    All other components within normal limits  BRAIN NATRIURETIC PEPTIDE - Abnormal; Notable for the following components:   B Natriuretic Peptide 503.7 (*)    All other components within normal limits  CBG MONITORING, ED - Abnormal; Notable for the following components:   Glucose-Capillary 117 (*)    All other components within normal limits  TROPONIN I (HIGH SENSITIVITY) - Abnormal; Notable for the following components:   Troponin I (High Sensitivity) 37 (*)    All other components within normal limits  RESP PANEL BY RT-PCR (RSV, FLU A&B, COVID)  RVPGX2  LIPASE, BLOOD  URINALYSIS, ROUTINE W REFLEX MICROSCOPIC  TROPONIN I (HIGH SENSITIVITY)    EKG EKG Interpretation  Date/Time:  Wednesday April 04 2022 15:43:27 EDT Ventricular Rate:  76 PR Interval:  180 QRS Duration: 110 QT Interval:  386 QTC Calculation: 434 R Axis:   -33 Text Interpretation: Sinus rhythm Probable left atrial enlargement Abnormal R-wave progression, late transition LVH with IVCD and secondary repol abnrm Anterior ST elevation, probably due to LVH No significant change was found Confirmed by Ezequiel Essex [phone]) on 04/04/2022 3:46:24  PM  Radiology CT Head Wo Contrast  Result Date: 04/04/2022 CLINICAL DATA:  Headache. EXAM: CT HEAD WITHOUT CONTRAST TECHNIQUE: Contiguous axial images were obtained from the base of the skull through the vertex without intravenous contrast. RADIATION DOSE REDUCTION: This exam was performed according to the departmental dose-optimization program which includes automated exposure control, adjustment of the mA and/or kV according to patient size and/or use of iterative reconstruction technique. COMPARISON:  July 09, 2021 FINDINGS: Brain: There is mild cerebral atrophy with widening of the extra-axial spaces and ventricular dilatation. There are areas of decreased attenuation within the white matter tracts of the supratentorial brain, consistent with microvascular disease changes. Vascular: No hyperdense vessel or unexpected calcification. Skull: Normal. Negative for fracture or focal lesion. Sinuses/Orbits: No acute finding. Other: None. IMPRESSION: 1. No acute intracranial abnormality. 2. Generalized cerebral atrophy and microvascular disease changes of the supratentorial brain. Electronically Signed   By: Virgina Norfolk M.D.   On: 04/04/2022 17:31   DG ABD ACUTE 2+V W 1V CHEST  Result Date: 04/04/2022 CLINICAL DATA:  Abdominal pain.  Vomiting EXAM: DG ABDOMEN ACUTE WITH 1 VIEW CHEST COMPARISON:  X-ray 03/24/2022 and CT chest.  Older exams as well FINDINGS: No consolidation, pneumothorax or effusion. No edema. Normal cardiopericardial silhouette. Overlapping cardiac leads. Gas is seen in nondilated loops of small and large bowel. No obstruction. Minimal small bowel gas. No obvious free air seen beneath the diaphragm on the upright view. Scattered colonic stool. Vas deferens calcifications in the pelvis. Separate vascular calcifications. IMPRESSION: Nonspecific bowel gas pattern with scattered stool. No acute cardiopulmonary disease Electronically Signed   By: Jill Side M.D.   On: 04/04/2022 15:40     Procedures Procedures    Medications Ordered in ED Medications  ondansetron (ZOFRAN) injection 4 mg (has no administration in time range)  sodium chloride 0.9 % bolus 500 mL (has no administration in time range)    ED Course/ Medical Decision Making/ A&P Clinical Course as of 04/04/22 1819  Wed  Apr 04, 2022  1800 CT Head Wo Contrast 1. No acute intracranial abnormality. 2. Generalized cerebral atrophy and microvascular disease changes of the supratentorial brain.   [HN]    Clinical Course User Index [HN] Audley Hose, MD                             Medical Decision Making Amount and/or Complexity of Data Reviewed Labs: ordered. Decision-making details documented in ED Course. Radiology: ordered and independent interpretation performed. Decision-making details documented in ED Course. ECG/medicine tests: ordered and independent interpretation performed. Decision-making details documented in ED Course.  Risk Prescription drug management.  Nausea and vomiting for the past 3 days unable to tolerate p.o. or his medications.  Denies abdominal pain or chest pain.  Hypertensive on arrival. CBG elevated. R/o DKA. Will give gentle hydration given history of CHF  Creatinine improving from previous.  Hyperglycemia without evidence of DKA.  Anion gap is normal. Not particularly uremic.   Abdomen soft without peritoneal signs. Unable to urinate. Will check bladder scan.   Labs with improvement of creatinine as above. Anion gap normal.  Unclear etiology of vomiting. May be due to recent UTI, gastroparesis, CKD. Low suspicion for acute surgical pathology but will obtain CT scan.   CT and UA pending at shift change. Patient will likely require readmission for intractable nausea and vomiting and inability to take home medications.  Dr. Mayra Neer to assume care.        Final Clinical Impression(s) / ED Diagnoses Final diagnoses:  None    Rx / DC Orders ED Discharge Orders      None         Shivansh Hardaway, Annie Main, MD 04/04/22 Vernelle Emerald

## 2022-04-04 NOTE — ED Triage Notes (Signed)
Pt BIBA from home for NV x3 days, has frequent vomiting d/t CKD but says he also 'feels sick'. Has not been able to keep his meds down x3 days. Home PT was concerned about elevated BP. Reports neuropathy worse in the last week. Denies urinary sx, no blood in emesis.  182/104 HR 84 RR 16 100% RA CBG 144

## 2022-04-04 NOTE — ED Notes (Signed)
Attempted to collect urine sample. Pt unable to void att. Will re-attempt later

## 2022-04-05 ENCOUNTER — Encounter (HOSPITAL_COMMUNITY): Payer: Self-pay | Admitting: Family Medicine

## 2022-04-05 ENCOUNTER — Other Ambulatory Visit: Payer: Self-pay

## 2022-04-05 DIAGNOSIS — Z1612 Extended spectrum beta lactamase (ESBL) resistance: Secondary | ICD-10-CM | POA: Diagnosis not present

## 2022-04-05 DIAGNOSIS — B9629 Other Escherichia coli [E. coli] as the cause of diseases classified elsewhere: Secondary | ICD-10-CM | POA: Diagnosis not present

## 2022-04-05 DIAGNOSIS — N390 Urinary tract infection, site not specified: Secondary | ICD-10-CM | POA: Diagnosis not present

## 2022-04-05 LAB — CBC WITH DIFFERENTIAL/PLATELET
Abs Immature Granulocytes: 0.01 10*3/uL (ref 0.00–0.07)
Basophils Absolute: 0.1 10*3/uL (ref 0.0–0.1)
Basophils Relative: 1 %
Eosinophils Absolute: 0.1 10*3/uL (ref 0.0–0.5)
Eosinophils Relative: 2 %
HCT: 36.6 % — ABNORMAL LOW (ref 39.0–52.0)
Hemoglobin: 11.8 g/dL — ABNORMAL LOW (ref 13.0–17.0)
Immature Granulocytes: 0 %
Lymphocytes Relative: 26 %
Lymphs Abs: 1.7 10*3/uL (ref 0.7–4.0)
MCH: 29.2 pg (ref 26.0–34.0)
MCHC: 32.2 g/dL (ref 30.0–36.0)
MCV: 90.6 fL (ref 80.0–100.0)
Monocytes Absolute: 0.6 10*3/uL (ref 0.1–1.0)
Monocytes Relative: 9 %
Neutro Abs: 4 10*3/uL (ref 1.7–7.7)
Neutrophils Relative %: 62 %
Platelets: 484 10*3/uL — ABNORMAL HIGH (ref 150–400)
RBC: 4.04 MIL/uL — ABNORMAL LOW (ref 4.22–5.81)
RDW: 14.6 % (ref 11.5–15.5)
WBC: 6.5 10*3/uL (ref 4.0–10.5)
nRBC: 0 % (ref 0.0–0.2)

## 2022-04-05 LAB — GLUCOSE, CAPILLARY
Glucose-Capillary: 105 mg/dL — ABNORMAL HIGH (ref 70–99)
Glucose-Capillary: 110 mg/dL — ABNORMAL HIGH (ref 70–99)
Glucose-Capillary: 122 mg/dL — ABNORMAL HIGH (ref 70–99)
Glucose-Capillary: 143 mg/dL — ABNORMAL HIGH (ref 70–99)
Glucose-Capillary: 144 mg/dL — ABNORMAL HIGH (ref 70–99)
Glucose-Capillary: 150 mg/dL — ABNORMAL HIGH (ref 70–99)

## 2022-04-05 LAB — BASIC METABOLIC PANEL
Anion gap: 6 (ref 5–15)
BUN: 37 mg/dL — ABNORMAL HIGH (ref 8–23)
CO2: 24 mmol/L (ref 22–32)
Calcium: 8.7 mg/dL — ABNORMAL LOW (ref 8.9–10.3)
Chloride: 108 mmol/L (ref 98–111)
Creatinine, Ser: 3.92 mg/dL — ABNORMAL HIGH (ref 0.61–1.24)
GFR, Estimated: 17 mL/min — ABNORMAL LOW (ref >60)
Glucose, Bld: 113 mg/dL — ABNORMAL HIGH (ref 70–99)
Potassium: 3.6 mmol/L (ref 3.5–5.1)
Sodium: 138 mmol/L (ref 135–145)

## 2022-04-05 LAB — TROPONIN I (HIGH SENSITIVITY): Troponin I (High Sensitivity): 38 ng/L — ABNORMAL HIGH (ref <18)

## 2022-04-05 MED ORDER — INSULIN ASPART 100 UNIT/ML IJ SOLN
0.0000 [IU] | INTRAMUSCULAR | Status: DC
  Administered 2022-04-05 – 2022-04-06 (×3): 2 [IU] via SUBCUTANEOUS

## 2022-04-05 MED ORDER — HYDRALAZINE HCL 20 MG/ML IJ SOLN: 5.0000 mg | Freq: Four times a day (QID) | INTRAMUSCULAR | Status: DC | PRN

## 2022-04-05 MED ORDER — FAMOTIDINE 20 MG PO TABS
20.0000 mg | ORAL_TABLET | Freq: Every day | ORAL | Status: DC
  Administered 2022-04-05 – 2022-04-07 (×3): 20 mg via ORAL
  Filled 2022-04-05 (×3): qty 1

## 2022-04-05 MED ORDER — SODIUM CHLORIDE 0.9 % IV SOLN
500.0000 mg | Freq: Two times a day (BID) | INTRAVENOUS | Status: DC
  Administered 2022-04-05 – 2022-04-06 (×2): 500 mg via INTRAVENOUS
  Filled 2022-04-05 (×3): qty 10

## 2022-04-05 NOTE — Progress Notes (Addendum)
PROGRESS NOTE    Ryan Jenkins  [id number] DOB: 08/01/1960 DOA: 04/04/2022 PCP: [name], MD  Chief Complaint  Patient presents with   Emesis    Brief Narrative:   This is Ryan Jenkins 62y/o male with PMHx significant for DM2, HTN, HLD, CAD, chronic combined systolic and diastolic CHF, CKD 4, diabetic neuropathy, GERD, chronic anemia, anxiety/depression, arthritis, BPH, vitamin B12 deficiency, impaired mobility.  He was recently admitted 3/6-318 with diagnosis of ESBL Klebsiella pneumonia and mild CHF exacerbation.  Treated with 7 days IV meropenem, discharged 2 days ago. Of note patient grown ESBL Klebsiella in previous cultures 11/2021 and 12/2021. As patient was asymptomatic, these were not treated at that time.     Per patient he is nausea and vomiting resumed the day after discharge.  This been ongoing the last 3 days.  He denies fever, chills, burning urination or hematuria.  He additional denies abdominal pain.  He endorses mild lightheadedness and dizziness and states he has not eaten or taken any home medications in the past 3 days due to his nausea.  In the ER patient's blood pressure during interview was 191/119.  He denies any neurological symptoms, headaches, localized weakness, except nausea and vomiting.   Assessment & Plan:   Principal Problem:   UTI due to extended-spectrum beta lactamase (ESBL) producing Escherichia coli Active Problems:   Diabetic neuropathy, type II diabetes mellitus (HCC)   DM2 (diabetes mellitus, type 2) (HCC)   Essential hypertension   Depression   HLD (hyperlipidemia)   CKD (chronic kidney disease) stage 4, GFR 15-29 ml/min (HCC)   BPH (benign prostatic hyperplasia)   UTI (urinary tract infection)   Chronic diastolic heart failure (HCC)  Urinary Tract Infection  Nausea  Vomiting  Abdominal Pain N/V abd pain due to presumed UTI UA with pyuria Follow final cultures Hx klebsiella ESBL infection Continue carbapenem, will narrow or d/c  based on culture recs Continue to consider other causes of his symptoms  CKD IV Creatinine appears to be at baseline Continue bicarb, calcitriol Lasix  Avoid nephrotoxins  CAD  HLD Continue plavix Continue metoprolol Crestor   HFpEF Continue lasix, metop  T2DM A1c 7.4 SSI  BPH Proscar, flomax    DVT prophylaxis: heparin Code Status: full Family Communication: none Disposition:   Status is: Inpatient Remains inpatient appropriate because: pending further improvement, urine culture results   Consultants:  none  Procedures:  none  Antimicrobials:  Anti-infectives (From admission, onward)    Start     Dose/Rate Route Frequency Ordered Stop   04/05/22 0000  imipenem-cilastatin (PRIMAXIN) 500 mg in sodium chloride 0.9 % 100 mL IVPB        500 mg 200 mL/hr over 30 Minutes Intravenous Every 12 hours 04/04/22 2314         Subjective: C/o N/V at presentation, some abdominal pain  Objective: Vitals:   04/04/22 2338 04/05/22 0100 04/05/22 0500 04/05/22 0900  BP: (!) 180/90 (!) 185/111 (!) 166/86 (!) 142/76  Pulse: 89 88 72 71  Resp: 18 20 17 18   Temp: 97.8 F (36.6 C) 98.3 F (36.8 C) 97.9 F (36.6 C) 98.1 F (36.7 C)  TempSrc: Oral Oral Oral Oral  SpO2: 100% 100% 100% 100%  Weight:      Height:        Intake/Output Summary (Last 24 hours) at 04/05/2022 1252 Last data filed at 04/05/2022 0846 Gross per 24 hour  Intake 336 ml  Output 350 ml  Net -14 ml  Filed Weights   04/04/22 1420  Weight: 96.8 kg    Examination:  General exam: Appears calm and comfortable  Respiratory system: unlabored Cardiovascular system: RRR Gastrointestinal system: mild suprapubic TTP, no CVA tenderness Central nervous system: Alert and oriented. No focal neurological deficits. Extremities: no LEE    Data Reviewed: I have personally reviewed following labs and imaging studies  CBC: Recent Labs  Lab 04/04/22 1434 04/05/22 0322  WBC 6.6 6.5  NEUTROABS 4.2  4.0  HGB 11.8* 11.8*  HCT 36.2* 36.6*  MCV 91.0 90.6  PLT 516* 484*    Basic Metabolic Panel: Recent Labs  Lab 04/04/22 1434 04/05/22 0322  NA 138 138  K 4.0 3.6  CL 109 108  CO2 23 24  GLUCOSE 132* 113*  BUN 40* 37*  CREATININE 4.17* 3.92*  CALCIUM 8.6* 8.7*    GFR: Estimated Creatinine Clearance: 23 mL/min (Romaine Maciolek) (by C-G formula based on SCr of 3.92 mg/dL (H)).  Liver Function Tests: Recent Labs  Lab 04/04/22 1434  AST 14*  ALT 8  ALKPHOS 60  BILITOT 0.7  PROT 8.3*  ALBUMIN 3.9    CBG: Recent Labs  Lab 03/29/22 1601 04/04/22 1542 04/05/22 0311 04/05/22 0753 04/05/22 1119  GLUCAP 106* 117* 122* 110* 150*     Recent Results (from the past 240 hour(s))  Resp panel by RT-PCR (RSV, Flu Khamora Karan&B, Covid) Anterior Nasal Swab     Status: None   Collection Time: 04/04/22  4:58 PM   Specimen: Anterior Nasal Swab  Result Value Ref Range Status   SARS Coronavirus 2 by RT PCR NEGATIVE NEGATIVE Final    Comment: (NOTE) SARS-CoV-2 target nucleic acids are NOT DETECTED.  The SARS-CoV-2 RNA is generally detectable in upper respiratory specimens during the acute phase of infection. The lowest concentration of SARS-CoV-2 viral copies this assay can detect is 138 copies/mL. Elah Avellino negative result does not preclude SARS-Cov-2 infection and should not be used as the sole basis for treatment or other patient management decisions. Marshawn Normoyle negative result may occur with  improper specimen collection/handling, submission of specimen other than nasopharyngeal swab, presence of viral mutation(s) within the areas targeted by this assay, and inadequate number of viral copies(<138 copies/mL). Amariz Flamenco negative result must be combined with clinical observations, patient history, and epidemiological information. The expected result is Negative.  Fact Sheet for Patients:  EntrepreneurPulse.com.au  Fact Sheet for Healthcare Providers:  IncredibleEmployment.be  This  test is no t yet approved or cleared by the [place] FDA and  has been authorized for detection and/or diagnosis of SARS-CoV-2 by FDA under an Emergency Use Authorization (EUA). This EUA will remain  in effect (meaning this test can be used) for the duration of the COVID-19 declaration under Section 564(b)(1) of the Act, 21 U.S.C.section 360bbb-3(b)(1), unless the authorization is terminated  or revoked sooner.       Influenza Niki Payment by PCR NEGATIVE NEGATIVE Final   Influenza B by PCR NEGATIVE NEGATIVE Final    Comment: (NOTE) The Xpert Xpress SARS-CoV-2/FLU/RSV plus assay is intended as an aid in the diagnosis of influenza from Nasopharyngeal swab specimens and should not be used as Jasslyn Finkel sole basis for treatment. Nasal washings and aspirates are unacceptable for Xpert Xpress SARS-CoV-2/FLU/RSV testing.  Fact Sheet for Patients: EntrepreneurPulse.com.au  Fact Sheet for Healthcare Providers: IncredibleEmployment.be  This test is not yet approved or cleared by the [place] FDA and has been authorized for detection and/or diagnosis of SARS-CoV-2 by FDA under an Emergency Use Authorization (EUA).  This EUA will remain in effect (meaning this test can be used) for the duration of the COVID-19 declaration under Section 564(b)(1) of the Act, 21 U.S.C. section 360bbb-3(b)(1), unless the authorization is terminated or revoked.     Resp Syncytial Virus by PCR NEGATIVE NEGATIVE Final    Comment: (NOTE) Fact Sheet for Patients: EntrepreneurPulse.com.au  Fact Sheet for Healthcare Providers: IncredibleEmployment.be  This test is not yet approved or cleared by the [place] FDA and has been authorized for detection and/or diagnosis of SARS-CoV-2 by FDA under an Emergency Use Authorization (EUA). This EUA will remain in effect (meaning this test can be used) for the duration of the COVID-19 declaration under  Section 564(b)(1) of the Act, 21 U.S.C. section 360bbb-3(b)(1), unless the authorization is terminated or revoked.  Performed at [facility], [place] [address] [place], [place] [address]          Radiology Studies: CT ABDOMEN PELVIS WO CONTRAST  Result Date: 04/04/2022 CLINICAL DATA:  Abdominal pain, acute, nonlocalized. EXAM: CT ABDOMEN AND PELVIS WITHOUT CONTRAST TECHNIQUE: Multidetector CT imaging of the abdomen and pelvis was performed following the standard protocol without IV contrast. RADIATION DOSE REDUCTION: This exam was performed according to the departmental dose-optimization program which includes automated exposure control, adjustment of the mA and/or kV according to patient size and/or use of iterative reconstruction technique. COMPARISON:  CT 10/31/2021 FINDINGS: Lower chest: Lung bases are clear.  No pleural effusions. Hepatobiliary: Normal appearance of the liver and gallbladder. Pancreas: Unremarkable. No pancreatic ductal dilatation or surrounding inflammatory changes. Spleen: Normal in size without focal abnormality. Adrenals/Urinary Tract: Normal adrenal glands. Bilateral perinephric stranding is similar to the previous examination. Negative for kidney stones or hydronephrosis. Bladder is mildly distended with diffuse bladder wall thickening and this is similar to the previous examination. Stomach/Bowel: Normal appearance of the stomach. Normal appearance of the appendix without inflammatory changes. No evidence for bowel dilatation or focal bowel inflammation. Vascular/Lymphatic: Mild atherosclerotic disease in the aorta and visceral arteries without aortic aneurysm. Proximal femoral arteries are heavily calcified. No lymph node enlargement in the abdomen or pelvis. Reproductive: Changes along the anterior central aspect of prostate compatible with history Tellis Spivak TURP procedure. Again noted are heavily calcified vas deferens. Other: Negative for free fluid. Small  umbilical hernia containing fat. Negative for free air. Musculoskeletal: No acute bone abnormality. IMPRESSION: 1. No acute abnormality in the abdomen or pelvis. 2. Mild diffuse bladder wall thickening and bilateral perinephric stranding is similar to the previous examination. Negative for hydronephrosis or urinary stones. Bladder wall thickening could be related to chronic bladder outlet obstruction. The perinephric stranding is nonspecific and could also represent chronic changes. 3. Small umbilical hernia containing fat. 4. Aortic Atherosclerosis (ICD10-I70.0). Electronically Signed   By: Markus Daft M.D.   On: 04/04/2022 19:19   CT Head Wo Contrast  Result Date: 04/04/2022 CLINICAL DATA:  Headache. EXAM: CT HEAD WITHOUT CONTRAST TECHNIQUE: Contiguous axial images were obtained from the base of the skull through the vertex without intravenous contrast. RADIATION DOSE REDUCTION: This exam was performed according to the departmental dose-optimization program which includes automated exposure control, adjustment of the mA and/or kV according to patient size and/or use of iterative reconstruction technique. COMPARISON:  July 09, 2021 FINDINGS: Brain: There is mild cerebral atrophy with widening of the extra-axial spaces and ventricular dilatation. There are areas of decreased attenuation within the white matter tracts of the supratentorial brain, consistent with microvascular disease changes. Vascular: No hyperdense vessel or unexpected  calcification. Skull: Normal. Negative for fracture or focal lesion. Sinuses/Orbits: No acute finding. Other: None. IMPRESSION: 1. No acute intracranial abnormality. 2. Generalized cerebral atrophy and microvascular disease changes of the supratentorial brain. Electronically Signed   By: Virgina Norfolk M.D.   On: 04/04/2022 17:31   DG ABD ACUTE 2+V W 1V CHEST  Result Date: 04/04/2022 CLINICAL DATA:  Abdominal pain.  Vomiting EXAM: DG ABDOMEN ACUTE WITH 1 VIEW CHEST  COMPARISON:  X-ray 03/24/2022 and CT chest.  Older exams as well FINDINGS: No consolidation, pneumothorax or effusion. No edema. Normal cardiopericardial silhouette. Overlapping cardiac leads. Gas is seen in nondilated loops of small and large bowel. No obstruction. Minimal small bowel gas. No obvious free air seen beneath the diaphragm on the upright view. Scattered colonic stool. Vas deferens calcifications in the pelvis. Separate vascular calcifications. IMPRESSION: Nonspecific bowel gas pattern with scattered stool. No acute cardiopulmonary disease Electronically Signed   By: Jill Side M.D.   On: 04/04/2022 15:40        Scheduled Meds:  amLODipine  10 mg Oral Daily   calcitRIOL  0.25 mcg Oral Daily   clopidogrel  75 mg Oral Daily   famotidine  20 mg Oral Daily   finasteride  5 mg Oral Daily   furosemide  80 mg Oral BID   heparin  5,000 Units Subcutaneous Q8H   insulin aspart  0-15 Units Subcutaneous Q4H   metoprolol succinate  12.5 mg Oral Daily   ondansetron (ZOFRAN) IV  4 mg Intravenous Q6H   rosuvastatin  10 mg Oral Daily   senna-docusate  1 tablet Oral QHS   sodium bicarbonate  650 mg Oral BID   tamsulosin  0.4 mg Oral Daily   Continuous Infusions:  imipenem-cilastatin 500 mg (04/05/22 1221)   promethazine (PHENERGAN) injection (IM or IVPB)       LOS: 1 day    Time spent: over 30 min    Fayrene Helper, MD [facility]   To contact the attending provider between 7A-7P or the covering provider during after hours 7P-7A, please log into the web site [email or web address] and access using universal  password for that web site. If you do not have the password, please call the hospital operator.  04/05/2022, 12:52 PM

## 2022-04-05 NOTE — TOC Initial Note (Signed)
Transition of Care [facility]) - Initial/Assessment Note    Patient Details  Name: Ryan Jenkins MRN: [id number] Date of Birth: 25-Apr-1960  [facility]) CM/SW Contact:    Vassie Moselle, LCSW Phone Number: 04/05/2022, 11:12 AM  Clinical Narrative:                 Pt currently active with [facility] for home health PT,OT,RN services. Pt plans to continue these services at discharge. Pt has a RW at home and denies further DME needs.  HH orders will need to be placed prior to discharge.   Expected Discharge Plan: [place] Barriers to Discharge: No Barriers Identified   Patient Goals and CMS Choice Patient states their goals for this hospitalization and ongoing recovery are:: To return home CMS Medicare.gov Compare Post Acute Care list provided to:: Patient Choice offered to / list presented to : Patient [place] ownership interest in [facility].provided to::  (NA)    Expected Discharge Plan and Services In-house Referral: NA Discharge Planning Services: NA Post Acute Care Choice: Home Health, Resumption of Svcs/PTA Provider Living arrangements for the past 2 months: Apartment                 DME Arranged: N/A DME Agency: NA                  Prior Living Arrangements/Services Living arrangements for the past 2 months: Apartment Lives with:: Self, Siblings Patient language and need for interpreter reviewed:: Yes        Need for Family Participation in Patient Care: No (Comment) Care giver support system in place?: Yes (comment) Current home services: DME, Home OT, Home PT, Home RN (RW) Criminal Activity/Legal Involvement Pertinent to Current Situation/Hospitalization: No - Comment as needed  Activities of Daily Living Home Assistive Devices/Equipment: Environmental consultant (specify type) ADL Screening (condition at time of admission) Patient's cognitive ability adequate to safely complete daily activities?: Yes Is the patient deaf or  have difficulty hearing?: No Does the patient have difficulty seeing, even when wearing glasses/contacts?: No Does the patient have difficulty concentrating, remembering, or making decisions?: No Patient able to express need for assistance with ADLs?: Yes Does the patient have difficulty dressing or bathing?: No Independently performs ADLs?: Yes (appropriate for developmental age) Does the patient have difficulty walking or climbing stairs?: Yes Weakness of Legs: Both Weakness of Arms/Hands: None  Permission Sought/Granted Permission sought to share information with : Facility Sport and exercise psychologist, Case Optician, dispensing granted to share information with : Yes, Verbal Permission Granted     Permission granted to share info w AGENCY: [facility]        Emotional Assessment Appearance:: Appears stated age Attitude/Demeanor/Rapport: Engaged Affect (typically observed): Pleasant Orientation: : Oriented to Self, Oriented to Place, Oriented to  Time, Oriented to Situation Alcohol / Substance Use: Not Applicable Psych Involvement: No (comment)  Admission diagnosis:  Acute cystitis without hematuria [N30.00] UTI due to extended-spectrum beta lactamase (ESBL) producing Escherichia coli [N39.0, B96.29, Z16.12] Nausea and vomiting, unspecified vomiting type [R11.2] Patient Active Problem List   Diagnosis Date Noted   UTI (urinary tract infection) 04/04/2022   Chronic diastolic heart failure ([place]) 04/04/2022   UTI due to extended-spectrum beta lactamase (ESBL) producing Escherichia coli 04/04/2022   Acute respiratory failure with hypoxia ([place]) 03/21/2022   Generalized weakness 01/25/2022   Acute on chronic systolic heart failure ([place]) 01/24/2022   Multifocal pneumonia 12/22/2021   CHF exacerbation ([place]) 10/19/2021  Hypokalemia 10/18/2021   Anemia in chronic kidney disease (CKD) 10/18/2021   BPH (benign prostatic hyperplasia) 10/16/2021   Severe sepsis ([place]) 06/05/2021   Coronary  artery disease 02/07/2021   Acute renal failure superimposed on stage 4 chronic kidney disease (HCC)    CKD (chronic kidney disease) stage 4, GFR 15-29 ml/min ([place]) 07/07/2020   Acute on chronic diastolic CHF (congestive heart failure) ([place]) 07/07/2020   Erectile dysfunction associated with type 2 diabetes mellitus ([place]) 08/07/2019   Vitreous hemorrhage of left eye ([place]) 07/22/2019   Vision loss of left eye 07/22/2019   History of medication noncompliance 04/02/2019   Gastric ulcer without hemorrhage or perforation 12/25/2018   Gastroesophageal reflux disease without esophagitis 09/04/2018   Diabetic retinopathy of both eyes associated with type 2 diabetes mellitus ([place]) 01/03/2018   Intermittent diarrhea 09/27/2017   Gastroparesis 09/27/2017   Macroalbuminuric diabetic nephropathy ([place]) 06/25/2017   History of falling 06/25/2017   HLD (hyperlipidemia) 03/21/2017   Vitamin B 12 deficiency 03/21/2017   DM2 (diabetes mellitus, type 2) ([place]) 02/05/2017   Essential hypertension 02/05/2017   Depression 02/05/2017   Unintended weight loss 02/05/2017   Gait disturbance 02/05/2017   Pronation deformity of both feet 05/11/2014   Diabetic neuropathy, type II diabetes mellitus ([place]) 05/11/2014   Metatarsal deformity 05/11/2014   PCP:  [name], MD Pharmacy:   [facility] [place], [place] - [address] AT [place] [address] [place] [address] Phone: [phone] Fax: [phone]  [id number] - [place], [place] [id number] [place] [id number] Phone: [phone] Fax: [phone]  [facility] [address] [place] [address] Phone: [phone] Fax: [phone]     Social Determinants of Health (SDOH) Social History: [place]: Patient Declined (04/05/2022)  Housing: Low Risk  (04/05/2022)  Transportation  Needs: Patient Declined (04/05/2022)  Utilities: Patient Declined (04/05/2022)  Depression (PHQ2-9): Medium Risk (08/07/2019)  Tobacco Use: Low Risk  (04/05/2022)   SDOH Interventions: Housing Interventions: Patient Refused   Readmission Risk Interventions    04/05/2022   11:10 AM 03/26/2022    5:46 PM 11/29/2021    4:16 PM  Readmission Risk Prevention Plan  Transportation Screening Complete Complete Complete  Medication Review (RN Care Manager) Complete Referral to Pharmacy Complete  PCP or Specialist appointment within 3-5 days of discharge Complete    HRI or [place] Complete Complete Complete  SW Recovery Care/Counseling Consult Complete Complete Complete  Palliative Care Screening Not Applicable Not Applicable Not [place] Not Applicable Not Applicable Not Applicable

## 2022-04-06 ENCOUNTER — Inpatient Hospital Stay (HOSPITAL_COMMUNITY): Payer: Medicaid Other

## 2022-04-06 DIAGNOSIS — B9629 Other Escherichia coli [E. coli] as the cause of diseases classified elsewhere: Secondary | ICD-10-CM | POA: Diagnosis not present

## 2022-04-06 DIAGNOSIS — N390 Urinary tract infection, site not specified: Secondary | ICD-10-CM | POA: Diagnosis not present

## 2022-04-06 DIAGNOSIS — Z1612 Extended spectrum beta lactamase (ESBL) resistance: Secondary | ICD-10-CM | POA: Diagnosis not present

## 2022-04-06 LAB — CBC WITH DIFFERENTIAL/PLATELET
Abs Immature Granulocytes: 0.01 10*3/uL (ref 0.00–0.07)
Basophils Absolute: 0.1 10*3/uL (ref 0.0–0.1)
Basophils Relative: 2 %
Eosinophils Absolute: 0.2 10*3/uL (ref 0.0–0.5)
Eosinophils Relative: 3 %
HCT: 32.4 % — ABNORMAL LOW (ref 39.0–52.0)
Hemoglobin: 10.4 g/dL — ABNORMAL LOW (ref 13.0–17.0)
Immature Granulocytes: 0 %
Lymphocytes Relative: 33 %
Lymphs Abs: 1.6 10*3/uL (ref 0.7–4.0)
MCH: 29.4 pg (ref 26.0–34.0)
MCHC: 32.1 g/dL (ref 30.0–36.0)
MCV: 91.5 fL (ref 80.0–100.0)
Monocytes Absolute: 0.7 10*3/uL (ref 0.1–1.0)
Monocytes Relative: 14 %
Neutro Abs: 2.3 10*3/uL (ref 1.7–7.7)
Neutrophils Relative %: 48 %
Platelets: 421 10*3/uL — ABNORMAL HIGH (ref 150–400)
RBC: 3.54 MIL/uL — ABNORMAL LOW (ref 4.22–5.81)
RDW: 14.7 % (ref 11.5–15.5)
WBC: 4.7 10*3/uL (ref 4.0–10.5)
nRBC: 0 % (ref 0.0–0.2)

## 2022-04-06 LAB — COMPREHENSIVE METABOLIC PANEL
ALT: 8 U/L (ref 0–44)
AST: 13 U/L — ABNORMAL LOW (ref 15–41)
Albumin: 3.3 g/dL — ABNORMAL LOW (ref 3.5–5.0)
Alkaline Phosphatase: 48 U/L (ref 38–126)
Anion gap: 10 (ref 5–15)
BUN: 40 mg/dL — ABNORMAL HIGH (ref 8–23)
CO2: 22 mmol/L (ref 22–32)
Calcium: 8.5 mg/dL — ABNORMAL LOW (ref 8.9–10.3)
Chloride: 105 mmol/L (ref 98–111)
Creatinine, Ser: 4.37 mg/dL — ABNORMAL HIGH (ref 0.61–1.24)
GFR, Estimated: 15 mL/min — ABNORMAL LOW (ref >60)
Glucose, Bld: 77 mg/dL (ref 70–99)
Potassium: 3.6 mmol/L (ref 3.5–5.1)
Sodium: 137 mmol/L (ref 135–145)
Total Bilirubin: 0.5 mg/dL (ref 0.3–1.2)
Total Protein: 7.1 g/dL (ref 6.5–8.1)

## 2022-04-06 LAB — GLUCOSE, CAPILLARY
Glucose-Capillary: 80 mg/dL (ref 70–99)
Glucose-Capillary: 82 mg/dL (ref 70–99)
Glucose-Capillary: 105 mg/dL — ABNORMAL HIGH (ref 70–99)
Glucose-Capillary: 125 mg/dL — ABNORMAL HIGH (ref 70–99)
Glucose-Capillary: 164 mg/dL — ABNORMAL HIGH (ref 70–99)

## 2022-04-06 LAB — PHOSPHORUS: Phosphorus: 4.3 mg/dL (ref 2.5–4.6)

## 2022-04-06 LAB — URINE CULTURE: Culture: >=100000 — AB

## 2022-04-06 LAB — MAGNESIUM: Magnesium: 1.9 mg/dL (ref 1.7–2.4)

## 2022-04-06 MED ORDER — INSULIN ASPART 100 UNIT/ML IJ SOLN
0.0000 [IU] | Freq: Three times a day (TID) | INTRAMUSCULAR | Status: DC
  Administered 2022-04-07 – 2022-04-08 (×4): 2 [IU] via SUBCUTANEOUS

## 2022-04-06 MED ORDER — TECHNETIUM TC 99M SULFUR COLLOID
2.2000 | Freq: Once | INTRAVENOUS | Status: AC | PRN
  Administered 2022-04-06: 2.2 via ORAL

## 2022-04-06 MED ORDER — METOCLOPRAMIDE HCL 5 MG PO TABS
5.0000 mg | ORAL_TABLET | Freq: Three times a day (TID) | ORAL | Status: DC
  Filled 2022-04-06 (×2): qty 1

## 2022-04-06 MED ORDER — POLYETHYLENE GLYCOL 3350 17 G PO PACK
17.0000 g | PACK | Freq: Two times a day (BID) | ORAL | Status: DC
  Administered 2022-04-07 – 2022-04-08 (×4): 17 g via ORAL
  Filled 2022-04-06 (×5): qty 1

## 2022-04-06 NOTE — Plan of Care (Signed)
  Problem: Pain Managment: Goal: General experience of comfort will improve Outcome: Progressing   Problem: Safety: Goal: Ability to remain free from injury will improve Outcome: Progressing   Problem: Nutritional: Goal: Maintenance of adequate nutrition will improve Outcome: Progressing

## 2022-04-06 NOTE — Progress Notes (Addendum)
PROGRESS NOTE    Ryan Jenkins  [id number] DOB: October 10, 1960 DOA: 04/04/2022 PCP: [name], MD  Chief Complaint  Patient presents with   Emesis    Brief Narrative:   This is Ryan Jenkins 62y/o male with PMHx significant for DM2, HTN, HLD, CAD, chronic combined systolic and diastolic CHF, CKD 4, diabetic neuropathy, GERD, chronic anemia, anxiety/depression, arthritis, BPH, vitamin B12 deficiency, impaired mobility.  He was recently admitted 3/6-318 with diagnosis of ESBL Klebsiella pneumonia and mild CHF exacerbation.  Treated with 7 days IV meropenem, discharged 2 days ago. Of note patient grown ESBL Klebsiella in previous cultures 11/2021 and 12/2021. As patient was asymptomatic, these were not treated at that time.     Per patient he is nausea and vomiting resumed the day after discharge.  This been ongoing the last 3 days.  He denies fever, chills, burning urination or hematuria.  He additional denies abdominal pain.  He endorses mild lightheadedness and dizziness and states he has not eaten or taken any home medications in the past 3 days due to his nausea.  In the ER patient's blood pressure during interview was 191/119.  He denies any neurological symptoms, headaches, localized weakness, except nausea and vomiting.   Assessment & Plan:   Principal Problem:   UTI due to extended-spectrum beta lactamase (ESBL) producing Escherichia coli Active Problems:   Diabetic neuropathy, type II diabetes mellitus (HCC)   DM2 (diabetes mellitus, type 2) (HCC)   Essential hypertension   Depression   HLD (hyperlipidemia)   CKD (chronic kidney disease) stage 4, GFR 15-29 ml/min (HCC)   BPH (benign prostatic hyperplasia)   UTI (urinary tract infection)   Chronic diastolic heart failure (HCC)  Urinary Tract Infection  Nausea  Vomiting  Abdominal Pain N/V abd pain due to presumed UTI UA with pyuria Follow final cultures -> again with ESBL klebsiella  Hx klebsiella ESBL infection Continue  carbapenem, will narrow or d/c based on culture recs Continue to consider other causes of his symptoms - not sure UTI completely explains his symptoms, doesn't appear to have pyelo (no CVA tenderness) which I would expect to cause n/v.  Has had longstanding DM with neuropathy.  ? Gastroparesis.  Will obtain gastric emptying study, consider trial of reglan.  Could also consider his CKD/renal disease (but this is overall improved from his latest hospitalization, BUN has trended down --- I believe this is unlikely as Arletha Marschke cause at this time).  CKD IV Creatinine appears to be at baseline Continue bicarb, calcitriol Lasix  Avoid nephrotoxins Labs pending today  CAD  HLD Continue plavix Continue metoprolol Crestor   HFpEF Continue lasix, metop  T2DM A1c 7.4 SSI  BPH Proscar, flomax    DVT prophylaxis: heparin Code Status: full Family Communication: none Disposition:   Status is: Inpatient Remains inpatient appropriate because: pending further improvement, urine culture results   Consultants:  none  Procedures:  none  Antimicrobials:  Anti-infectives (From admission, onward)    Start     Dose/Rate Route Frequency Ordered Stop   04/05/22 0000  imipenem-cilastatin (PRIMAXIN) 500 mg in sodium chloride 0.9 % 100 mL IVPB        500 mg 200 mL/hr over 30 Minutes Intravenous Every 12 hours 04/04/22 2314         Subjective: Nausea vomiting new issues over the past 2 hospitalizations  Objective: Vitals:   04/04/22 2338 04/05/22 0100 04/05/22 0500 04/05/22 0900  BP: (!) 180/90 (!) 185/111 (!) 166/86 (!) 142/76  Pulse:  89 88 72 71  Resp: 18 20 17 18   Temp: 97.8 F (36.6 C) 98.3 F (36.8 C) 97.9 F (36.6 C) 98.1 F (36.7 C)  TempSrc: Oral Oral Oral Oral  SpO2: 100% 100% 100% 100%  Weight:      Height:        Intake/Output Summary (Last 24 hours) at 04/05/2022 1252 Last data filed at 04/05/2022 0846 Gross per 24 hour  Intake 336 ml  Output 350 ml  Net -14 ml    Filed Weights   04/04/22 1420  Weight: 96.8 kg    Examination:  General: No acute distress. Cardiovascular: RRR Lungs: Clear to auscultation bilaterally  Abdomen: Soft, nontender, nondistended with normal active bowel sounds. No masses. No hepatosplenomegaly. Extremities: No clubbing or cyanosis. No edema.  Data Reviewed: I have personally reviewed following labs and imaging studies  CBC: Recent Labs  Lab 04/04/22 1434 04/05/22 0322  WBC 6.6 6.5  NEUTROABS 4.2 4.0  HGB 11.8* 11.8*  HCT 36.2* 36.6*  MCV 91.0 90.6  PLT 516* 484*    Basic Metabolic Panel: Recent Labs  Lab 04/04/22 1434 04/05/22 0322  NA 138 138  K 4.0 3.6  CL 109 108  CO2 23 24  GLUCOSE 132* 113*  BUN 40* 37*  CREATININE 4.17* 3.92*  CALCIUM 8.6* 8.7*    GFR: Estimated Creatinine Clearance: 23 mL/min (Saryn Cherry) (by C-G formula based on SCr of 3.92 mg/dL (H)).  Liver Function Tests: Recent Labs  Lab 04/04/22 1434  AST 14*  ALT 8  ALKPHOS 60  BILITOT 0.7  PROT 8.3*  ALBUMIN 3.9    CBG: Recent Labs  Lab 03/29/22 1601 04/04/22 1542 04/05/22 0311 04/05/22 0753 04/05/22 1119  GLUCAP 106* 117* 122* 110* 150*     Recent Results (from the past 240 hour(s))  Resp panel by RT-PCR (RSV, Flu [facility]&B, Covid) Anterior Nasal Swab     Status: None   Collection Time: 04/04/22  4:58 PM   Specimen: Anterior Nasal Swab  Result Value Ref Range Status   SARS Coronavirus 2 by RT PCR NEGATIVE NEGATIVE Final    Comment: (NOTE) SARS-CoV-2 target nucleic acids are NOT DETECTED.  The SARS-CoV-2 RNA is generally detectable in upper respiratory specimens during the acute phase of infection. The lowest concentration of SARS-CoV-2 viral copies this assay can detect is 138 copies/mL. Conita Amenta negative result does not preclude SARS-Cov-2 infection and should not be used as the sole basis for treatment or other patient management decisions. Yosiel Thieme negative result may occur with  improper specimen collection/handling,  submission of specimen other than nasopharyngeal swab, presence of viral mutation(s) within the areas targeted by this assay, and inadequate number of viral copies(<138 copies/mL). Riyah Bardon negative result must be combined with clinical observations, patient history, and epidemiological information. The expected result is Negative.  Fact Sheet for Patients:  EntrepreneurPulse.com.au  Fact Sheet for Healthcare Providers:  IncredibleEmployment.be  This test is no t yet approved or cleared by the [place] FDA and  has been authorized for detection and/or diagnosis of SARS-CoV-2 by FDA under an Emergency Use Authorization (EUA). This EUA will remain  in effect (meaning this test can be used) for the duration of the COVID-19 declaration under Section 564(b)(1) of the Act, 21 U.S.C.section 360bbb-3(b)(1), unless the authorization is terminated  or revoked sooner.       Influenza Edrian Melucci by PCR NEGATIVE NEGATIVE Final   Influenza B by PCR NEGATIVE NEGATIVE Final    Comment: (NOTE) The Xpert Xpress SARS-CoV-2/FLU/RSV  plus assay is intended as an aid in the diagnosis of influenza from Nasopharyngeal swab specimens and should not be used as Thurl Boen sole basis for treatment. Nasal washings and aspirates are unacceptable for Xpert Xpress SARS-CoV-2/FLU/RSV testing.  Fact Sheet for Patients: EntrepreneurPulse.com.au  Fact Sheet for Healthcare Providers: IncredibleEmployment.be  This test is not yet approved or cleared by the [place] FDA and has been authorized for detection and/or diagnosis of SARS-CoV-2 by FDA under an Emergency Use Authorization (EUA). This EUA will remain in effect (meaning this test can be used) for the duration of the COVID-19 declaration under Section 564(b)(1) of the Act, 21 U.S.C. section 360bbb-3(b)(1), unless the authorization is terminated or revoked.     Resp Syncytial Virus by PCR NEGATIVE  NEGATIVE Final    Comment: (NOTE) Fact Sheet for Patients: EntrepreneurPulse.com.au  Fact Sheet for Healthcare Providers: IncredibleEmployment.be  This test is not yet approved or cleared by the [place] FDA and has been authorized for detection and/or diagnosis of SARS-CoV-2 by FDA under an Emergency Use Authorization (EUA). This EUA will remain in effect (meaning this test can be used) for the duration of the COVID-19 declaration under Section 564(b)(1) of the Act, 21 U.S.C. section 360bbb-3(b)(1), unless the authorization is terminated or revoked.  Performed at [facility], [place] [address], [place], [place] [address]          Radiology Studies: CT ABDOMEN PELVIS WO CONTRAST  Result Date: 04/04/2022 CLINICAL DATA:  Abdominal pain, acute, nonlocalized. EXAM: CT ABDOMEN AND PELVIS WITHOUT CONTRAST TECHNIQUE: Multidetector CT imaging of the abdomen and pelvis was performed following the standard protocol without IV contrast. RADIATION DOSE REDUCTION: This exam was performed according to the departmental dose-optimization program which includes automated exposure control, adjustment of the mA and/or kV according to patient size and/or use of iterative reconstruction technique. COMPARISON:  CT 10/31/2021 FINDINGS: Lower chest: Lung bases are clear.  No pleural effusions. Hepatobiliary: Normal appearance of the liver and gallbladder. Pancreas: Unremarkable. No pancreatic ductal dilatation or surrounding inflammatory changes. Spleen: Normal in size without focal abnormality. Adrenals/Urinary Tract: Normal adrenal glands. Bilateral perinephric stranding is similar to the previous examination. Negative for kidney stones or hydronephrosis. Bladder is mildly distended with diffuse bladder wall thickening and this is similar to the previous examination. Stomach/Bowel: Normal appearance of the stomach. Normal appearance of the appendix  without inflammatory changes. No evidence for bowel dilatation or focal bowel inflammation. Vascular/Lymphatic: Mild atherosclerotic disease in the aorta and visceral arteries without aortic aneurysm. Proximal femoral arteries are heavily calcified. No lymph node enlargement in the abdomen or pelvis. Reproductive: Changes along the anterior central aspect of prostate compatible with history Alainah Phang TURP procedure. Again noted are heavily calcified vas deferens. Other: Negative for free fluid. Small umbilical hernia containing fat. Negative for free air. Musculoskeletal: No acute bone abnormality. IMPRESSION: 1. No acute abnormality in the abdomen or pelvis. 2. Mild diffuse bladder wall thickening and bilateral perinephric stranding is similar to the previous examination. Negative for hydronephrosis or urinary stones. Bladder wall thickening could be related to chronic bladder outlet obstruction. The perinephric stranding is nonspecific and could also represent chronic changes. 3. Small umbilical hernia containing fat. 4. Aortic Atherosclerosis (ICD10-I70.0). Electronically Signed   By: Markus Daft M.D.   On: 04/04/2022 19:19   CT Head Wo Contrast  Result Date: 04/04/2022 CLINICAL DATA:  Headache. EXAM: CT HEAD WITHOUT CONTRAST TECHNIQUE: Contiguous axial images were obtained from the base of the skull through the vertex without  intravenous contrast. RADIATION DOSE REDUCTION: This exam was performed according to the departmental dose-optimization program which includes automated exposure control, adjustment of the mA and/or kV according to patient size and/or use of iterative reconstruction technique. COMPARISON:  July 09, 2021 FINDINGS: Brain: There is mild cerebral atrophy with widening of the extra-axial spaces and ventricular dilatation. There are areas of decreased attenuation within the white matter tracts of the supratentorial brain, consistent with microvascular disease changes. Vascular: No hyperdense vessel or  unexpected calcification. Skull: Normal. Negative for fracture or focal lesion. Sinuses/Orbits: No acute finding. Other: None. IMPRESSION: 1. No acute intracranial abnormality. 2. Generalized cerebral atrophy and microvascular disease changes of the supratentorial brain. Electronically Signed   By: Virgina Norfolk M.D.   On: 04/04/2022 17:31   DG ABD ACUTE 2+V W 1V CHEST  Result Date: 04/04/2022 CLINICAL DATA:  Abdominal pain.  Vomiting EXAM: DG ABDOMEN ACUTE WITH 1 VIEW CHEST COMPARISON:  X-ray 03/24/2022 and CT chest.  Older exams as well FINDINGS: No consolidation, pneumothorax or effusion. No edema. Normal cardiopericardial silhouette. Overlapping cardiac leads. Gas is seen in nondilated loops of small and large bowel. No obstruction. Minimal small bowel gas. No obvious free air seen beneath the diaphragm on the upright view. Scattered colonic stool. Vas deferens calcifications in the pelvis. Separate vascular calcifications. IMPRESSION: Nonspecific bowel gas pattern with scattered stool. No acute cardiopulmonary disease Electronically Signed   By: Jill Side M.D.   On: 04/04/2022 15:40        Scheduled Meds:  amLODipine  10 mg Oral Daily   calcitRIOL  0.25 mcg Oral Daily   clopidogrel  75 mg Oral Daily   famotidine  20 mg Oral Daily   finasteride  5 mg Oral Daily   furosemide  80 mg Oral BID   heparin  5,000 Units Subcutaneous Q8H   insulin aspart  0-15 Units Subcutaneous Q4H   metoprolol succinate  12.5 mg Oral Daily   ondansetron (ZOFRAN) IV  4 mg Intravenous Q6H   rosuvastatin  10 mg Oral Daily   senna-docusate  1 tablet Oral QHS   sodium bicarbonate  650 mg Oral BID   tamsulosin  0.4 mg Oral Daily   Continuous Infusions:  imipenem-cilastatin 500 mg (04/05/22 1221)   promethazine (PHENERGAN) injection (IM or IVPB)       LOS: 1 day    Time spent: over 30 min    Fayrene Helper, MD [facility]   To contact the attending provider between 7A-7P or the covering  provider during after hours 7P-7A, please log into the web site [email or web address] and access using universal [place] password for that web site. If you do not have the password, please call the hospital operator.  04/05/2022, 12:52 PM

## 2022-04-07 DIAGNOSIS — N390 Urinary tract infection, site not specified: Secondary | ICD-10-CM | POA: Diagnosis not present

## 2022-04-07 DIAGNOSIS — B9629 Other Escherichia coli [E. coli] as the cause of diseases classified elsewhere: Secondary | ICD-10-CM | POA: Diagnosis not present

## 2022-04-07 DIAGNOSIS — Z1612 Extended spectrum beta lactamase (ESBL) resistance: Secondary | ICD-10-CM | POA: Diagnosis not present

## 2022-04-07 LAB — CBC
HCT: 34.1 % — ABNORMAL LOW (ref 39.0–52.0)
Hemoglobin: 10.8 g/dL — ABNORMAL LOW (ref 13.0–17.0)
MCH: 29.1 pg (ref 26.0–34.0)
MCHC: 31.7 g/dL (ref 30.0–36.0)
MCV: 91.9 fL (ref 80.0–100.0)
Platelets: 386 10*3/uL (ref 150–400)
RBC: 3.71 MIL/uL — ABNORMAL LOW (ref 4.22–5.81)
RDW: 14.8 % (ref 11.5–15.5)
WBC: 4.9 10*3/uL (ref 4.0–10.5)
nRBC: 0 % (ref 0.0–0.2)

## 2022-04-07 LAB — PHOSPHORUS: Phosphorus: 5 mg/dL — ABNORMAL HIGH (ref 2.5–4.6)

## 2022-04-07 LAB — GLUCOSE, CAPILLARY
Glucose-Capillary: 92 mg/dL (ref 70–99)
Glucose-Capillary: 145 mg/dL — ABNORMAL HIGH (ref 70–99)
Glucose-Capillary: 121 mg/dL — ABNORMAL HIGH (ref 70–99)
Glucose-Capillary: 152 mg/dL — ABNORMAL HIGH (ref 70–99)

## 2022-04-07 LAB — BASIC METABOLIC PANEL
Anion gap: 12 (ref 5–15)
BUN: 41 mg/dL — ABNORMAL HIGH (ref 8–23)
CO2: 20 mmol/L — ABNORMAL LOW (ref 22–32)
Calcium: 8.8 mg/dL — ABNORMAL LOW (ref 8.9–10.3)
Chloride: 104 mmol/L (ref 98–111)
Creatinine, Ser: 4.49 mg/dL — ABNORMAL HIGH (ref 0.61–1.24)
GFR, Estimated: 14 mL/min — ABNORMAL LOW (ref >60)
Glucose, Bld: 97 mg/dL (ref 70–99)
Potassium: 3.7 mmol/L (ref 3.5–5.1)
Sodium: 136 mmol/L (ref 135–145)

## 2022-04-07 LAB — MAGNESIUM: Magnesium: 1.9 mg/dL (ref 1.7–2.4)

## 2022-04-07 MED ORDER — METOCLOPRAMIDE HCL 5 MG PO TABS
5.0000 mg | ORAL_TABLET | Freq: Three times a day (TID) | ORAL | Status: DC
  Administered 2022-04-07 – 2022-04-08 (×6): 5 mg via ORAL
  Filled 2022-04-07 (×6): qty 1

## 2022-04-07 MED ORDER — FAMOTIDINE 20 MG PO TABS
10.0000 mg | ORAL_TABLET | Freq: Every day | ORAL | Status: DC
  Administered 2022-04-08 – 2022-04-09 (×2): 10 mg via ORAL
  Filled 2022-04-07 (×2): qty 1

## 2022-04-07 NOTE — Progress Notes (Signed)
PROGRESS NOTE    [name]  [id number] DOB: Jun 21, 1960 DOA: 04/04/2022 PCP: [name], MD  Chief Complaint  Patient presents with   Emesis    Brief Narrative:   This is Ryan Jenkins 62y/o male with PMHx significant for DM2, HTN, HLD, CAD, chronic combined systolic and diastolic CHF, CKD 4, diabetic neuropathy, GERD, chronic anemia, anxiety/depression, arthritis, BPH, vitamin B12 deficiency, impaired mobility.  He was recently admitted 3/6-318 with diagnosis of ESBL Klebsiella pneumonia and mild CHF exacerbation.  Treated with 7 days IV meropenem, discharged 2 days ago. Of note patient grown ESBL Klebsiella in previous cultures 11/2021 and 12/2021. As patient was asymptomatic, these were not treated at that time.     Per patient he is nausea and vomiting resumed the day after discharge.  This been ongoing the last 3 days.  Gastric emptying study with delayed emptying.  Will treat for gastroparesis.   Assessment & Plan:   Principal Problem:   UTI due to extended-spectrum beta lactamase (ESBL) producing Escherichia coli Active Problems:   Diabetic neuropathy, type II diabetes mellitus (HCC)   DM2 (diabetes mellitus, type 2) (HCC)   Essential hypertension   Depression   HLD (hyperlipidemia)   CKD (chronic kidney disease) stage 4, GFR 15-29 ml/min (HCC)   BPH (benign prostatic hyperplasia)   UTI (urinary tract infection)   Chronic diastolic heart failure (HCC)  Nausea, Vomiting, Abdominal Pain due to Diabetic Gastroparesis Delayed gastric emptying study Will trial reglan for 24 hrs (didn't receive any yet) prior to discharge, hold other antiemetics for now Gastroparesis diet education  Asymptomatic Bacteruria  ESBL Klebsiella I think UTI less likely cause of symptoms above, he doesn't have pyelo without CVA tenderness or imaging findings c/w that, so N/V related to UTI would be less likely.  Will stop abx and monitor with treatment of above.  CKD IV Creatinine appears to be  at baseline Continue bicarb, calcitriol Lasix  Avoid nephrotoxins Relatively stable today  CAD  HLD Continue plavix Continue metoprolol Crestor   HFpEF Continue lasix, metop  T2DM A1c 7.4 SSI  BPH Proscar, flomax    DVT prophylaxis: heparin Code Status: full Family Communication: none Disposition:   Status is: Inpatient Remains inpatient appropriate because: pending further improvement, urine culture results   Consultants:  none  Procedures:  none  Antimicrobials:  Anti-infectives (From admission, onward)    Start     Dose/Rate Route Frequency Ordered Stop   04/05/22 2200  meropenem (MERREM) 500 mg in sodium chloride 0.9 % 100 mL IVPB  Status:  Discontinued        500 mg 200 mL/hr over 30 Minutes Intravenous Every 12 hours 04/05/22 1307 04/06/22 1703   04/05/22 0000  imipenem-cilastatin (PRIMAXIN) 500 mg in sodium chloride 0.9 % 100 mL IVPB  Status:  Discontinued        500 mg 200 mL/hr over 30 Minutes Intravenous Every 12 hours 04/04/22 2314 04/05/22 1307       Subjective: Nausea vomiting new issues over the past 2 hospitalizations  Objective: Vitals:   04/06/22 0455 04/06/22 0919 04/06/22 1924 04/07/22 0639  BP: (!) 144/74 (!) 180/97 (!) 159/86 (!) 157/89  Pulse: 64 70 76 65  Resp: 17 20 19 18   Temp: 97.9 F (36.6 C) 98 F (36.7 C) 97.8 F (36.6 C) 97.9 F (36.6 C)  TempSrc: Oral Oral  Oral  SpO2: 100% 100% 100% 100%  Weight:      Height:  Intake/Output Summary (Last 24 hours) at 04/07/2022 0901 Last data filed at 04/07/2022 0550 Gross per 24 hour  Intake 473 ml  Output 1350 ml  Net -877 ml   Filed Weights   04/04/22 1420  Weight: 96.8 kg    Examination:  General: No acute distress. Cardiovascular: RRR Lungs: unlabored Abdomen: Soft, nontender, nondistended Neurological: Alert and oriented 3. Moves all extremities 4 with equal strength. Cranial nerves II through XII grossly intact. Extremities: No clubbing or cyanosis.  No edema.   Data Reviewed: I have personally reviewed following labs and imaging studies  CBC: Recent Labs  Lab 04/04/22 1434 04/05/22 0322 04/06/22 0542 04/07/22 0532  WBC 6.6 6.5 4.7 4.9  NEUTROABS 4.2 4.0 2.3  --   HGB 11.8* 11.8* 10.4* 10.8*  HCT 36.2* 36.6* 32.4* 34.1*  MCV 91.0 90.6 91.5 91.9  PLT 516* 484* 421* [id number]    Basic Metabolic Panel: Recent Labs  Lab 04/04/22 1434 04/05/22 0322 04/06/22 0542 04/07/22 0532  NA 138 138 137 136  K 4.0 3.6 3.6 3.7  CL 109 108 105 104  CO2 23 24 22  20*  GLUCOSE 132* 113* 77 97  BUN 40* 37* 40* 41*  CREATININE 4.17* 3.92* 4.37* 4.49*  CALCIUM 8.6* 8.7* 8.5* 8.8*  MG  --   --  1.9 1.9  PHOS  --   --  4.3 5.0*    GFR: Estimated Creatinine Clearance: 20.1 mL/min (Kang Ishida) (by C-G formula based on SCr of 4.49 mg/dL (H)).  Liver Function Tests: Recent Labs  Lab 04/04/22 1434 04/06/22 0542  AST 14* 13*  ALT 8 8  ALKPHOS 60 48  BILITOT 0.7 0.5  PROT 8.3* 7.1  ALBUMIN 3.9 3.3*    CBG: Recent Labs  Lab 04/06/22 0731 04/06/22 1125 04/06/22 1649 04/06/22 1940 04/07/22 0753  GLUCAP 82 105* 125* 164* 92     Recent Results (from the past 240 hour(s))  Resp panel by RT-PCR (RSV, Flu Cade Dashner&B, Covid) Anterior Nasal Swab     Status: None   Collection Time: 04/04/22  4:58 PM   Specimen: Anterior Nasal Swab  Result Value Ref Range Status   SARS Coronavirus 2 by RT PCR NEGATIVE NEGATIVE Final    Comment: (NOTE) SARS-CoV-2 target nucleic acids are NOT DETECTED.  The SARS-CoV-2 RNA is generally detectable in upper respiratory specimens during the acute phase of infection. The lowest concentration of SARS-CoV-2 viral copies this assay can detect is 138 copies/mL. Rendon Howell negative result does not preclude SARS-Cov-2 infection and should not be used as the sole basis for treatment or other patient management decisions. Nahia Nissan negative result may occur with  improper specimen collection/handling, submission of specimen other than  nasopharyngeal swab, presence of viral mutation(s) within the areas targeted by this assay, and inadequate number of viral copies(<138 copies/mL). Jae Bruck negative result must be combined with clinical observations, patient history, and epidemiological information. The expected result is Negative.  Fact Sheet for Patients:  EntrepreneurPulse.com.au  Fact Sheet for Healthcare Providers:  IncredibleEmployment.be  This test is no t yet approved or cleared by the [place] FDA and  has been authorized for detection and/or diagnosis of SARS-CoV-2 by FDA under an Emergency Use Authorization (EUA). This EUA will remain  in effect (meaning this test can be used) for the duration of the COVID-19 declaration under Section 564(b)(1) of the Act, 21 U.S.C.section 360bbb-3(b)(1), unless the authorization is terminated  or revoked sooner.       Influenza [place] by PCR NEGATIVE NEGATIVE Final  Influenza B by PCR NEGATIVE NEGATIVE Final    Comment: (NOTE) The Xpert Xpress SARS-CoV-2/FLU/RSV plus assay is intended as an aid in the diagnosis of influenza from Nasopharyngeal swab specimens and should not be used as Telecia Larocque sole basis for treatment. Nasal washings and aspirates are unacceptable for Xpert Xpress SARS-CoV-2/FLU/RSV testing.  Fact Sheet for Patients: EntrepreneurPulse.com.au  Fact Sheet for Healthcare Providers: IncredibleEmployment.be  This test is not yet approved or cleared by the [place] FDA and has been authorized for detection and/or diagnosis of SARS-CoV-2 by FDA under an Emergency Use Authorization (EUA). This EUA will remain in effect (meaning this test can be used) for the duration of the COVID-19 declaration under Section 564(b)(1) of the Act, 21 U.S.C. section 360bbb-3(b)(1), unless the authorization is terminated or revoked.     Resp Syncytial Virus by PCR NEGATIVE NEGATIVE Final    Comment:  (NOTE) Fact Sheet for Patients: EntrepreneurPulse.com.au  Fact Sheet for Healthcare Providers: IncredibleEmployment.be  This test is not yet approved or cleared by the [place] FDA and has been authorized for detection and/or diagnosis of SARS-CoV-2 by FDA under an Emergency Use Authorization (EUA). This EUA will remain in effect (meaning this test can be used) for the duration of the COVID-19 declaration under Section 564(b)(1) of the Act, 21 U.S.C. section 360bbb-3(b)(1), unless the authorization is terminated or revoked.  Performed at [facility], [place] [address], [place], [place] [address]   Urine Culture (for pregnant, neutropenic or urologic patients or patients with an indwelling urinary catheter)     Status: Abnormal   Collection Time: 04/04/22  9:58 PM   Specimen: Urine, Clean Catch  Result Value Ref Range Status   Specimen Description   Final    URINE, CLEAN CATCH Performed at [facility], [place] [address] [place], [place] [address]    Special Requests   Final    NONE Performed at [facility], [place] [address] [place], [place] [address]    Culture (Pius Byrom)  Final    >=100,000 COLONIES/mL KLEBSIELLA PNEUMONIAE Confirmed Extended Spectrum Beta-Lactamase Producer (ESBL).  In bloodstream infections from ESBL organisms, carbapenems are preferred over piperacillin/tazobactam. They are shown to have Luz Burcher lower risk of mortality.    Report Status 04/06/2022 FINAL  Final   Organism ID, Bacteria KLEBSIELLA PNEUMONIAE (Thurman Sarver)  Final      Susceptibility   Klebsiella pneumoniae - MIC*    AMPICILLIN >=32 RESISTANT Resistant     CEFAZOLIN >=64 RESISTANT Resistant     CEFEPIME >=32 RESISTANT Resistant     CEFTRIAXONE >=64 RESISTANT Resistant     CIPROFLOXACIN >=4 RESISTANT Resistant     GENTAMICIN <=1 SENSITIVE Sensitive     IMIPENEM <=0.25 SENSITIVE Sensitive     NITROFURANTOIN 64 INTERMEDIATE  Intermediate     TRIMETH/SULFA >=320 RESISTANT Resistant     AMPICILLIN/SULBACTAM 16 INTERMEDIATE Intermediate     PIP/TAZO 8 SENSITIVE Sensitive     * >=100,000 COLONIES/mL KLEBSIELLA PNEUMONIAE  Culture, blood (Routine X 2) w Reflex to ID Panel     Status: None (Preliminary result)   Collection Time: 04/05/22  3:22 AM   Specimen: BLOOD  Result Value Ref Range Status   Specimen Description   Final    BLOOD BLOOD RIGHT ARM Performed at [place] [address], [place], [place] [address]    Special Requests   Final    BOTTLES DRAWN AEROBIC ONLY Blood Culture adequate volume Performed at [facility],  [place] [address] [place], [place] [address]    Culture   Final    NO GROWTH 1 DAY Performed at [facility], [place] [address], [place], [place] [address]    Report Status PENDING  Incomplete  Culture, blood (Routine X 2) w Reflex to ID Panel     Status: None (Preliminary result)   Collection Time: 04/05/22  3:22 AM   Specimen: BLOOD  Result Value Ref Range Status   Specimen Description   Final    BLOOD BLOOD RIGHT HAND Performed at [place] [address], [place], [place] [address]    Special Requests   Final    BOTTLES DRAWN AEROBIC ONLY Blood Culture adequate volume Performed at [place] [address], [place], [place] [address]    Culture   Final    NO GROWTH 1 DAY Performed at [facility], [place] [address], [place], [place] [address]    Report Status PENDING  Incomplete         Radiology Studies: NM GASTRIC EMPTYING  Result Date: 04/06/2022 CLINICAL DATA:  Nausea and vomiting for 4 days, diabetes EXAM: NUCLEAR MEDICINE GASTRIC EMPTYING SCAN TECHNIQUE: After oral ingestion of radiolabeled meal, sequential abdominal images were obtained for 4 hours. Percentage of activity emptying the stomach was calculated at 1 hour, 2 hour, 3 hour, and 4 hours. RADIOPHARMACEUTICALS:  2.2  mCi Tc-70m sulfur colloid in standardized meal COMPARISON:  CT 04/04/2022 FINDINGS: Expected location of the stomach in the left upper quadrant. Ingested meal empties the stomach gradually over the course of the study. 17% emptied at 1 hr ( normal >= 10%) 32% emptied at 2 hr ( normal >= 40%) 47% emptied at 3 hr ( normal >= 70%) 62% emptied at 4 hr ( normal >= 90%) IMPRESSION: Delayed gastric emptying study. Electronically Signed   By: Randa Ngo M.D.   On: 04/06/2022 16:22        Scheduled Meds:  amLODipine  10 mg Oral Daily   calcitRIOL  0.25 mcg Oral Daily   clopidogrel  75 mg Oral Daily   famotidine  20 mg Oral Daily   finasteride  5 mg Oral Daily   furosemide  80 mg Oral BID   heparin  5,000 Units Subcutaneous Q8H   insulin aspart  0-15 Units Subcutaneous TID WC   metoCLOPramide  5 mg Oral TID AC & HS   metoprolol succinate  12.5 mg Oral Daily   polyethylene glycol  17 g Oral BID   rosuvastatin  10 mg Oral Daily   senna-docusate  1 tablet Oral QHS   sodium bicarbonate  650 mg Oral BID   tamsulosin  0.4 mg Oral Daily   Continuous Infusions:     LOS: 3 days    Time spent: over 30 min    Fayrene Helper, MD [facility]   To contact the attending provider between 7A-7P or the covering provider during after hours 7P-7A, please log into the web site [email or web address] and access using universal [place] password for that web site. If you do not have the password, please call the hospital operator.  04/07/2022, 9:01 AM

## 2022-04-07 NOTE — Evaluation (Signed)
Physical Therapy Evaluation Patient Details Name: Ryan Jenkins MRN: [id number] DOB: [birth year] Today's Date: 04/07/2022  History of Present Illness  62 yo male admitted with UTI. Recent d/c from [facility] 03/27/22. Hx of neuropathy, DM, CKD, HF  Clinical Impression  On eval, pt was Min guard A for mobility. He walked ~120 feet with a rollator. He tolerated distance well. No LOB with rollator use. Discussed d/c plan-pt is hopeful to d/c home soon. Pt will benefit from post acute rehab. Will continue to work with pt during this hospital stay.        Recommendations for follow up therapy are one component of a multi-disciplinary discharge planning process, led by the attending physician.  Recommendations may be updated based on patient status, additional functional criteria and insurance authorization.  Follow Up Recommendations Home health PT      Assistance Recommended at Discharge Intermittent Supervision/Assistance  Patient can return home with the following  A little help with walking and/or transfers;A little help with bathing/dressing/bathroom;Assistance with cooking/housework;Help with stairs or ramp for entrance;Assist for transportation    Equipment Recommendations None recommended by PT  Recommendations for Other Services       Functional Status Assessment Patient has had a recent decline in their functional status and demonstrates the ability to make significant improvements in function in a reasonable and predictable amount of time.     Precautions / Restrictions Precautions Precautions: Fall Restrictions Weight Bearing Restrictions: No      Mobility  Bed Mobility Overal bed mobility: Modified Independent                  Transfers Overall transfer level: Needs assistance Equipment used: Rollator (4 wheels) Transfers: Sit to/from Stand Sit to Stand: Supervision           General transfer comment: Supv for safety.    Ambulation/Gait Ambulation/Gait  assistance: Min guard Gait Distance (Feet): 120 Feet Assistive device: Rollator (4 wheels) Gait Pattern/deviations: Step-through pattern, Decreased stride length       General Gait Details: MIn guard for safety. Slow but steady gait with RW. Pt reports history of neuropathy that causes pain and balance deficits  Stairs            Wheelchair Mobility    Modified Rankin (Stroke Patients Only)       Balance Overall balance assessment: Needs assistance         Standing balance support: During functional activity, Reliant on assistive device for balance Standing balance-Leahy Scale: Fair                               Pertinent Vitals/Pain Pain Assessment Pain Assessment: No/denies pain Pain Intervention(s): Monitored during session    Home Living Family/patient expects to be discharged to:: Private residence Living Arrangements: Other relatives (brother) Available Help at Discharge: Family;Available PRN/intermittently;Personal care attendant Type of Home: Apartment Home Access: Level entry       Home Layout: One level Home Equipment: Conservation officer, nature (2 wheels);Rollator (4 wheels);BSC/3in1;Tub bench;Wheelchair - manual Additional Comments: brother works 5a-2p    Prior Function Prior Level of Function : Needs assist             Mobility Comments: mostly uses WC in the home, rollator outside of the home; could ambulate in community but fatigues; was getting HHPT ADLs Comments: has PCA 3x a week to assist with ADLs if needed     Hand Dominance  Extremity/Trunk Assessment   Upper Extremity Assessment Upper Extremity Assessment: Overall WFL for tasks assessed    Lower Extremity Assessment Lower Extremity Assessment: Generalized weakness RLE Sensation: history of peripheral neuropathy LLE Sensation: history of peripheral neuropathy    Cervical / Trunk Assessment Cervical / Trunk Assessment: Kyphotic Cervical / Trunk Exceptions:  mild kyphosis/rounded shoulder/forward head  Communication   Communication: No difficulties  Cognition Arousal/Alertness: Awake/alert Behavior During Therapy: WFL for tasks assessed/performed Overall Cognitive Status: Within Functional Limits for tasks assessed                                          General Comments      Exercises     Assessment/Plan    PT Assessment Patient needs continued PT services  PT Problem List Decreased balance;Decreased activity tolerance;Decreased mobility;Decreased knowledge of use of DME       PT Treatment Interventions DME instruction;Therapeutic exercise;Gait training;Balance training;Stair training;Functional mobility training;Therapeutic activities;Patient/family education;Modalities;Neuromuscular re-education    PT Goals (Current goals can be found in the Care Plan section)  Acute Rehab PT Goals Patient Stated Goal: return home PT Goal Formulation: With patient Time For Goal Achievement: 04/21/22 Potential to Achieve Goals: Good    Frequency Min 3X/week     Co-evaluation               AM-PAC PT "6 Clicks" Mobility  Outcome Measure Help needed turning from your back to your side while in a flat bed without using bedrails?: None Help needed moving from lying on your back to sitting on the side of a flat bed without using bedrails?: None Help needed moving to and from a bed to a chair (including a wheelchair)?: A Little Help needed standing up from a chair using your arms (e.g., wheelchair or bedside chair)?: A Little Help needed to walk in hospital room?: A Little Help needed climbing 3-5 steps with a railing? : A Lot 6 Click Score: 19    End of Session Equipment Utilized During Treatment: Gait belt Activity Tolerance: Patient tolerated treatment well Patient left: in chair;with call bell/phone within reach   PT Visit Diagnosis: Difficulty in walking, not elsewhere classified (R26.2)    Time:  [id number] PT Time Calculation (min) (ACUTE ONLY): 30 min   Charges:   PT Evaluation $PT Eval Low Complexity: 1 Low PT Treatments $Gait Training: 8-22 mins          Doreatha Massed, PT Acute Rehabilitation  Office: [phone]

## 2022-04-07 NOTE — Discharge Instructions (Signed)
Gastroparesis Nutrition Therapy  Gastroparesis means that your stomach empties very slowly. This happens when the nerves to your stomach are damaged or do not work properly. This can cause bloating, stomach discomfort or pain, feeling full after eating only a small amount of food, nausea, or vomiting. If you have diabetes in addition to gastroparesis, it is important to control your blood glucose. This will help the stomach empty.  Tips Following these tips may help your stomach empty faster:  Eat small, frequent meals (4 to 6 times per day).  Do not eat solid foods that are high in fat and do not add too much fat to foods. See the Foods Not Recommended table for foods that are high fat.  High-fat solid foods may delay the emptying of your stomach.  Liquids that contain fat, such as milkshakes, may be tolerated and can provide needed calories. Do not eat foods high in fiber. Do not take fiber supplements or fiber bulking agents for constipation.  Do not eat foods that increase acid reflux:  Acidic, spicy, fried and greasy foods  Caffeine  Mint Do not drink alcohol or smoke  Do not drink carbonated beverages, as they increase bloating.  Chew foods well before swallowing. Solid foods in the stomach do not empty well. If you have difficulty tolerating solid foods, ground foods may be better.  If symptoms are severe, semi-solid foods or liquids may need to be your main food sources. Choose liquid nutritional supplements that have less than or equal to 2 grams fiber per serving.  Sit upright while eating and sit upright or walk after meals. Do not lie down for 3 to 4 hours after eating to avoid reflux or regurgitation.  If you wish to nap during the day, nap first and then eat.  Drinking fluids at meals can take up room in your stomach, and you might not get enough calories. At every meal, first eat a grain food and a protein food or dairy product if your body can tolerate it. Drink fluids with  calories. It may be better to delay fluids until after the meal and drink more between meals.   Foods Recommended Food Group Foods Recommended   Grains Choose grain foods with less than 2 grams of fiber per serving; these will be made with white flour Crackers: saltines or graham crackers Cold cereal: puffed rice Cream of rice or wheat Grits (fine ground) Gluten free low fiber foods Pretzels White bread, toasted White rice, cook until very soft  Protein Foods Lean meat and poultry: well-cooked, very tender, moist, and chopped fine Fish: tuna, salmon, or white fish Egg whites, scrambled Peanut butter (limit to 1 tablespoon at a time)  Dairy Milk*, drink 2% if tolerated to get more nutrients or lactose-free 2% milk Fortified non-dairy milks: almond, cashew, coconut, or rice (be aware that these options are not good sources of protein so you will need to eat an additional protein food) Fortified pea milk or soymilk (may cause gas and bloating for some) Instant breakfast* (pre-made lactose-free is sold in bottles) Milkshakes* (try blending in  to  cup canned fruit) Ice cream* (low-fat may be tolerated better; use in milkshakes to increase calories) Frozen yogurt Yogurt* Puddings and custard* Sherbet Liquid nutritional supplements with less than or equal to 2 grams fiber per 1 cup serving *Use lactose-free varieties to reduce gas and bloating  Vegetables Canned and well-cooked vegetables without seeds, skins or hulls Carrots, cooked Mashed potatoes (white, red or yellow) Sweet   potato  Fruit Canned, soft and well-cooked fruits without seeds, skins or membranes Applesauce Banana, mashed may be tolerated better Diced peaches/pears fruit cups in juice Melon, very soft, cut into small pieces Fruit nectar juices  Oils When possible choose oils rather than solid fats Canola or olive oil Margarine  Other Clear soup Gelatin Popsicles   Foods Not Recommended Food Group Foods Not  Recommended   Grains Bran Grains foods with 2 or more grams of fiber per serving: barley, brown rice, kasha, quinoa Popcorn Whole grain and high-fiber cereals, including oats or granola Whole grain bread or pasta  Protein Foods Fried meats, poultry or fish Sausage, bacon or hot dogs Seafood Tough meat, meat with gristle: steak, roast beef or pork chops Beans, peas or lentils Nuts  Dairy Cheese slices Liquid nutritional supplements that have more than 2 grams fiber per serving Pea milk, soymilk (may increase gas and bloating)  Vegetables Raw or undercooked vegetables Alfalfa, asparagus, bean sprouts, broccoli, brussels sprouts, cabbage, cauliflower, corn, green peas or any other kind of peas, lima beans, mushrooms, okra, onions, parsnips, peppers, pickles, potato skins, or spinach  Fruit Fresh fruit except for the ones in the foods recommended table Acidic fruit and juices: oranges/orange juice, grapefruit/grapefruit juice, tomatoes/tomato juice Avocado Berries Coconut Dried fruit Fruit skin Mandarin oranges Pineapple  Oils Fried foods of any type  Other Coffee Olives or pickles Pizza Salsa Sushi   Gastroparesis Sample 1-Day Menu  Breakfast 1 slice white toast (1 carbohydrate serving)  1 teaspoon margarine, soft, tub   cup egg substitute  1 cup peach nectar (2 carbohydrate servings)  Morning Snack Smoothie made with:  small banana (1 carbohydrate serving)  1/3 cup Greek strawberry yogurt ( carbohydrate serving)  1 cup 2% milk (1 carbohydrate serving)  Lunch 2 ounces canned chicken  1 teaspoon mayonnaise  9 saltine crackers (1 carbohydrate servings)   cup applesauce (1 carbohydrate serving)  Afternoon Snack 1 slice white toast (1 carbohydrate serving)  1 tablespoon smooth peanut butter  Evening Meal 2 ounces baked fish   cup mashed potatoes (1 carbohydrate serving)  1 teaspoon olive oil  1 cup 2% milk (1 carbohydrate serving)  Evening Snack 1 packet instant  breakfast (1 carbohydrate servings)  1 cup 2% milk (1 carbohydrate serving)   Gastroparesis Vegetarian (Lacto-Ovo) Sample 1-Day Menu  Breakfast  cup cooked farina (1 carbohydrate serving)   cup egg substitute  2 teaspoons olive oil   cup peach nectar (2 carbohydrate servings)   cup 2% milk ( carbohydrate serving)  Morning Snack 1 slice white toast (1 carbohydrate serving)  1 tablespoon smooth peanut butter  Lunch  cup vegetable soup (1 carbohydrate serving)  9 saltine crackers (1 carbohydrate serving)   cup applesauce (1 carbohydrate serving)   cup 2% milk ( carbohydrate serving)  Afternoon Snack 6 ounces plain yogurt (1 carbohydrate serving)   small banana (1 carbohydrate servings)  Evening Meal  cup baked tofu  2/3 cup white rice (2 carbohydrate servings)  2 teaspoons olive oil   cup 2% milk ( carbohydrate serving)  Evening Snack 1 packet instant breakfast (1 carbohydrate servings)  1 cup 2% milk (1 carbohydrate serving)   Gastroparesis Vegan Sample 1-Day Menu  Breakfast  cup cooked farina (1 carbohydrate serving)  1/3 cup tofu scramble  2 teaspoons olive oil   cup peach nectar (2 carbohydrate servings)   cup almond milk fortified with calcium, vitamin B12, and vitamin D  Morning Snack 1 slice white toast (1   carbohydrate serving)  1 tablespoon smooth peanut butter  Lunch  cup vegetable soup (1 carbohydrate serving)  9 saltine crackers (1 carbohydrate serving)   cup applesauce (1 carbohydrate serving)  Afternoon Snack 6 ounces plain soy yogurt (1 carbohydrate servings)   small banana (1 carbohydrate serving)  Evening Meal  cup baked tofu  2/3 cup white rice (2 carbohydrate servings)  2 teaspoons olive oil   cup almond milk fortified with calcium, vitamin B12, and vitamin D  Evening Snack  scoop soy protein powder ( carbohydrate serving)   cup almond milk fortified with calcium, vitamin B12, and vitamin D  Copyright 2020  [employer]. All rights reserved.   Carbohydrate Counting For People With Diabetes  Foods with carbohydrates make your blood glucose level go up. Learning how to count carbohydrates can help you control your blood glucose levels. First, identify the foods you eat that contain carbohydrates. Then, using the Foods with Carbohydrates chart, determine about how much carbohydrates are in your meals and snacks. Make sure you are eating foods with fiber, protein, and healthy fat along with your carbohydrate foods. Foods with Carbohydrates The following table shows carbohydrate foods that have about 15 grams of carbohydrate each. Using measuring cups, spoons, or a food scale when you first begin learning about carbohydrate counting can help you learn about the portion sizes you typically eat. The following foods have 15 grams carbohydrate each:  Grains 1 slice bread (1 ounce)  1 small tortilla (6-inch size)   large bagel (1 ounce)  1/3 cup pasta or rice (cooked)   hamburger or hot dog bun ( ounce)   cup cooked cereal   to  cup ready-to-eat cereal  2 taco shells (5-inch size) Fruit 1 small fresh fruit ( to 1 cup)   medium banana  17 small grapes (3 ounces)  1 cup melon or berries   cup canned or frozen fruit  2 tablespoons dried fruit (blueberries, cherries, cranberries, raisins)   cup unsweetened fruit juice  Starchy Vegetables  cup cooked beans, peas, corn, potatoes/sweet potatoes   large baked potato (3 ounces)  1 cup acorn or butternut squash  Snack Foods 3 to 6 crackers  8 potato chips or 13 tortilla chips ( ounce to 1 ounce)  3 cups popped popcorn  Dairy 3/4 cup (6 ounces) nonfat plain yogurt, or yogurt with sugar-free sweetener  1 cup milk  1 cup plain rice, soy, coconut or flavored almond milk Sweets and Desserts  cup ice cream or frozen yogurt  1 tablespoon jam, jelly, pancake syrup, table sugar, or honey  2 tablespoons light pancake syrup  1 inch square of frosted  cake or 2 inch square of unfrosted cake  2 small cookies (2/3 ounce each) or  large cookie  Sometimes you'll have to estimate carbohydrate amounts if you don't know the exact recipe. One cup of mixed foods like soups can have 1 to 2 carbohydrate servings, while some casseroles might have 2 or more servings of carbohydrate. Foods that have less than 20 calories in each serving can be counted as "free" foods. Count 1 cup raw vegetables, or  cup cooked non-starchy vegetables as "free" foods. If you eat 3 or more servings at one meal, then count them as 1 carbohydrate serving.  Foods without Carbohydrates  Not all foods contain carbohydrates. Meat, some dairy, fats, non-starchy vegetables, and many beverages don't contain carbohydrate. So when you count carbohydrates, you can generally exclude chicken, pork, beef,  fish, seafood, eggs, tofu, cheese, butter, sour cream, avocado, nuts, seeds, olives, mayonnaise, water, black coffee, unsweetened tea, and zero-calorie drinks. Vegetables with no or low carbohydrate include green beans, cauliflower, tomatoes, and onions. How much carbohydrate should I eat at each meal?  Carbohydrate counting can help you plan your meals and manage your weight. Following are some starting points for carbohydrate intake at each meal. Work with your registered dietitian nutritionist to find the best range that works for your blood glucose and weight.   To Lose Weight To Maintain Weight  Women 2 - 3 carb servings 3 - 4 carb servings  Men 3 - 4 carb servings 4 - 5 carb servings  Checking your blood glucose after meals will help you know if you need to adjust the timing, type, or number of carbohydrate servings in your meal plan. Achieve and keep a healthy body weight by balancing your food intake and physical activity.  Tips How should I plan my meals?  Plan for half the food on your plate to include non-starchy vegetables, like salad greens, broccoli, or carrots. Try to eat 3 to 5  servings of non-starchy vegetables every day. Have a protein food at each meal. Protein foods include chicken, fish, meat, eggs, or beans (note that beans contain carbohydrate). These two food groups (non-starchy vegetables and proteins) are low in carbohydrate. If you fill up your plate with these foods, you will eat less carbohydrate but still fill up your stomach. Try to limit your carbohydrate portion to  of the plate.  What fats are healthiest to eat?  Diabetes increases risk for heart disease. To help protect your heart, eat more healthy fats, such as olive oil, nuts, and avocado. Eat less saturated fats like butter, cream, and high-fat meats, like bacon and sausage. Avoid trans fats, which are in all foods that list "partially hydrogenated oil" as an ingredient. What should I drink?  Choose drinks that are not sweetened with sugar. The healthiest choices are water, carbonated or seltzer waters, and tea and coffee without added sugars.  Sweet drinks will make your blood glucose go up very quickly. One serving of soda or energy drink is  cup. It is best to drink these beverages only if your blood glucose is low.  Artificially sweetened, or diet drinks, typically do not increase your blood glucose if they have zero calories in them. Read labels of beverages, as some diet drinks do have carbohydrate and will raise your blood glucose. Label Reading Tips Read Nutrition Facts labels to find out how many grams of carbohydrate are in a food you want to eat. Don't forget: sometimes serving sizes on the label aren't the same as how much food you are going to eat, so you may need to calculate how much carbohydrate is in the food you are serving yourself.   Carbohydrate Counting for People with Diabetes Sample 1-Day Menu  Breakfast  cup yogurt, low fat, low sugar (1 carbohydrate serving)   cup cereal, ready-to-eat, unsweetened (1 carbohydrate serving)  1 cup strawberries (1 carbohydrate serving)   cup  almonds ( carbohydrate serving)  Lunch 1, 5 ounce can chunk light tuna  2 ounces cheese, low fat cheddar  6 whole wheat crackers (1 carbohydrate serving)  1 small apple (1 carbohydrate servings)   cup carrots ( carbohydrate serving)   cup snap peas  1 cup 1% milk (1 carbohydrate serving)   Evening Meal Stir fry made with: 3 ounces chicken  1 cup brown  rice (3 carbohydrate servings)   cup broccoli ( carbohydrate serving)   cup green beans   cup onions  1 tablespoon olive oil  2 tablespoons teriyaki sauce ( carbohydrate serving)  Evening Snack 1 extra small banana (1 carbohydrate serving)  1 tablespoon peanut butter   Carbohydrate Counting for People with Diabetes Vegan Sample 1-Day Menu  Breakfast 1 cup cooked oatmeal (2 carbohydrate servings)   cup blueberries (1 carbohydrate serving)  2 tablespoons flaxseeds  1 cup soymilk fortified with calcium and vitamin D  1 cup coffee  Lunch 2 slices whole wheat bread (2 carbohydrate servings)   cup baked tofu   cup lettuce  2 slices tomato  2 slices avocado   cup baby carrots ( carbohydrate serving)  1 orange (1 carbohydrate serving)  1 cup soymilk fortified with calcium and vitamin D   Evening Meal Burrito made with: 1 6-inch corn tortilla (1 carbohydrate serving)  1 cup refried vegetarian beans (2 carbohydrate servings)   cup chopped tomatoes   cup lettuce   cup salsa  1/3 cup brown rice (1 carbohydrate serving)  1 tablespoon olive oil for rice   cup zucchini   Evening Snack 6 small whole grain crackers (1 carbohydrate serving)  2 apricots ( carbohydrate serving)   cup unsalted peanuts ( carbohydrate serving)    Carbohydrate Counting for People with Diabetes Vegetarian (Lacto-Ovo) Sample 1-Day Menu  Breakfast 1 cup cooked oatmeal (2 carbohydrate servings)   cup blueberries (1 carbohydrate serving)  2 tablespoons flaxseeds  1 egg  1 cup 1% milk (1 carbohydrate serving)  1 cup coffee  Lunch 2 slices  whole wheat bread (2 carbohydrate servings)  2 ounces low-fat cheese   cup lettuce  2 slices tomato  2 slices avocado   cup baby carrots ( carbohydrate serving)  1 orange (1 carbohydrate serving)  1 cup unsweetened tea  Evening Meal Burrito made with: 1 6-inch corn tortilla (1 carbohydrate serving)   cup refried vegetarian beans (1 carbohydrate serving)   cup tomatoes   cup lettuce   cup salsa  1/3 cup brown rice (1 carbohydrate serving)  1 tablespoon olive oil for rice   cup zucchini  1 cup 1% milk (1 carbohydrate serving)  Evening Snack 6 small whole grain crackers (1 carbohydrate serving)  2 apricots ( carbohydrate serving)   cup unsalted peanuts ( carbohydrate serving)    Copyright 2020  [employer]y [employer]. All rights reserved.  Using Nutrition Labels: Carbohydrate  Serving Size  Look at the serving size. All the information on the label is based on this portion. Servings Per Container  The number of servings contained in the package. Guidelines for Carbohydrate  Look at the total grams of carbohydrate in the serving size.  1 carbohydrate choice = 15 grams of carbohydrate. Range of Carbohydrate Grams Per Choice  Carbohydrate Grams/Choice Carbohydrate Choices  6-10   11-20 1  21-25 1  26-35 2  36-40 2  41-50 3  51-55 3  56-65 4  66-70 4  71-80 5    Copyright 2020  [employer]y [employer]. All rights reserved.

## 2022-04-08 DIAGNOSIS — N390 Urinary tract infection, site not specified: Secondary | ICD-10-CM | POA: Diagnosis not present

## 2022-04-08 DIAGNOSIS — B9629 Other Escherichia coli [E. coli] as the cause of diseases classified elsewhere: Secondary | ICD-10-CM | POA: Diagnosis not present

## 2022-04-08 DIAGNOSIS — Z1612 Extended spectrum beta lactamase (ESBL) resistance: Secondary | ICD-10-CM | POA: Diagnosis not present

## 2022-04-08 DIAGNOSIS — E1140 Type 2 diabetes mellitus with diabetic neuropathy, unspecified: Secondary | ICD-10-CM | POA: Diagnosis not present

## 2022-04-08 DIAGNOSIS — E1143 Type 2 diabetes mellitus with diabetic autonomic (poly)neuropathy: Secondary | ICD-10-CM | POA: Diagnosis not present

## 2022-04-08 LAB — CBC WITH DIFFERENTIAL/PLATELET
Abs Immature Granulocytes: 0.01 10*3/uL (ref 0.00–0.07)
Basophils Absolute: 0.1 10*3/uL (ref 0.0–0.1)
Basophils Relative: 1 %
Eosinophils Absolute: 0.2 10*3/uL (ref 0.0–0.5)
Eosinophils Relative: 3 %
HCT: 33.1 % — ABNORMAL LOW (ref 39.0–52.0)
Hemoglobin: 10.8 g/dL — ABNORMAL LOW (ref 13.0–17.0)
Immature Granulocytes: 0 %
Lymphocytes Relative: 36 %
Lymphs Abs: 1.8 10*3/uL (ref 0.7–4.0)
MCH: 29.2 pg (ref 26.0–34.0)
MCHC: 32.6 g/dL (ref 30.0–36.0)
MCV: 89.5 fL (ref 80.0–100.0)
Monocytes Absolute: 0.6 10*3/uL (ref 0.1–1.0)
Monocytes Relative: 12 %
Neutro Abs: 2.5 10*3/uL (ref 1.7–7.7)
Neutrophils Relative %: 48 %
Platelets: 376 10*3/uL (ref 150–400)
RBC: 3.7 MIL/uL — ABNORMAL LOW (ref 4.22–5.81)
RDW: 14.6 % (ref 11.5–15.5)
WBC: 5.1 10*3/uL (ref 4.0–10.5)
nRBC: 0 % (ref 0.0–0.2)

## 2022-04-08 LAB — COMPREHENSIVE METABOLIC PANEL
ALT: 10 U/L (ref 0–44)
AST: 16 U/L (ref 15–41)
Albumin: 3.3 g/dL — ABNORMAL LOW (ref 3.5–5.0)
Alkaline Phosphatase: 53 U/L (ref 38–126)
Anion gap: 11 (ref 5–15)
BUN: 45 mg/dL — ABNORMAL HIGH (ref 8–23)
CO2: 23 mmol/L (ref 22–32)
Calcium: 8.6 mg/dL — ABNORMAL LOW (ref 8.9–10.3)
Chloride: 103 mmol/L (ref 98–111)
Creatinine, Ser: 4.96 mg/dL — ABNORMAL HIGH (ref 0.61–1.24)
GFR, Estimated: 13 mL/min — ABNORMAL LOW (ref >60)
Glucose, Bld: 101 mg/dL — ABNORMAL HIGH (ref 70–99)
Potassium: 3.6 mmol/L (ref 3.5–5.1)
Sodium: 137 mmol/L (ref 135–145)
Total Bilirubin: 0.5 mg/dL (ref 0.3–1.2)
Total Protein: 7.2 g/dL (ref 6.5–8.1)

## 2022-04-08 LAB — GLUCOSE, CAPILLARY
Glucose-Capillary: 122 mg/dL — ABNORMAL HIGH (ref 70–99)
Glucose-Capillary: 121 mg/dL — ABNORMAL HIGH (ref 70–99)
Glucose-Capillary: 116 mg/dL — ABNORMAL HIGH (ref 70–99)
Glucose-Capillary: 211 mg/dL — ABNORMAL HIGH (ref 70–99)

## 2022-04-08 LAB — PHOSPHORUS: Phosphorus: 5.3 mg/dL — ABNORMAL HIGH (ref 2.5–4.6)

## 2022-04-08 LAB — MAGNESIUM: Magnesium: 2.2 mg/dL (ref 1.7–2.4)

## 2022-04-08 MED ORDER — METOCLOPRAMIDE HCL 5 MG PO TABS
5.0000 mg | ORAL_TABLET | Freq: Three times a day (TID) | ORAL | Status: DC
  Administered 2022-04-08 – 2022-04-09 (×3): 5 mg via ORAL
  Filled 2022-04-08 (×3): qty 1

## 2022-04-08 MED ORDER — LACTATED RINGERS IV BOLUS
250.0000 mL | Freq: Once | INTRAVENOUS | Status: AC
  Administered 2022-04-08: 250 mL via INTRAVENOUS

## 2022-04-08 MED ORDER — METOCLOPRAMIDE HCL 10 MG PO TABS: 10.0000 mg | ORAL_TABLET | Freq: Three times a day (TID) | ORAL | Status: DC

## 2022-04-08 NOTE — Progress Notes (Signed)
Mobility Specialist - Progress Note   04/08/22 1411  Mobility  Activity Ambulated with assistance in hallway  Level of Assistance Standby assist, set-up cues, supervision of patient - no hands on  Assistive Device Front wheel walker  Distance Ambulated (ft) 250 ft  Activity Response Tolerated well  Mobility Referral Yes  $Mobility charge 1 Mobility   Pt received in bed and agreeable to mobility. No complaints during session. Pt to recliner after session with all needs met.    [facility]

## 2022-04-08 NOTE — Progress Notes (Addendum)
PROGRESS NOTE    Ryan Jenkins  [id number] DOB: 11-23-1960 DOA: 04/04/2022 PCP: [name], MD  Chief Complaint  Patient presents with   Emesis    Brief Narrative:   This is Ryan Jenkins 62y/o male with PMHx significant for DM2, HTN, HLD, CAD, chronic combined systolic and diastolic CHF, CKD 4, diabetic neuropathy, GERD, chronic anemia, anxiety/depression, arthritis, BPH, vitamin B12 deficiency, impaired mobility.  He was recently admitted 3/6-318 with diagnosis of ESBL Klebsiella pneumonia and mild CHF exacerbation.  Treated with 7 days IV meropenem, discharged 2 days ago. Of note patient grown ESBL Klebsiella in previous cultures 11/2021 and 12/2021. As patient was asymptomatic, these were not treated at that time.     Per patient he is nausea and vomiting resumed the day after discharge.  This been ongoing the last 3 days.  Gastric emptying study with delayed emptying.  Will treat for gastroparesis.   Assessment & Plan:   Principal Problem:   UTI due to extended-spectrum beta lactamase (ESBL) producing Escherichia coli Active Problems:   Diabetic neuropathy, type II diabetes mellitus (HCC)   DM2 (diabetes mellitus, type 2) (HCC)   Essential hypertension   Depression   HLD (hyperlipidemia)   CKD (chronic kidney disease) stage 4, GFR 15-29 ml/min (HCC)   BPH (benign prostatic hyperplasia)   UTI (urinary tract infection)   Chronic diastolic heart failure (HCC)  Orthostasis Positive by BP and w/ complaint of LH Hold lasix today, 250 cc LR Will reevaluate in AM, leave antihypertensive as is for now  Nausea, Vomiting, Abdominal Pain due to Diabetic Gastroparesis Delayed gastric emptying study Continue reglan trial, hold other antiemetics for now Gastroparesis diet education  Asymptomatic Bacteruria  ESBL Klebsiella I think UTI less likely cause of symptoms above, he doesn't have pyelo without CVA tenderness or imaging findings c/w that, so N/V related to UTI would be less  likely.  Will stop abx and monitor with treatment of above.  CKD IV Creatinine appears to be at baseline Continue bicarb, calcitriol Lasix  Avoid nephrotoxins Relatively stable today  CAD  HLD Continue plavix Continue metoprolol Crestor   HFpEF Continue lasix, metop  T2DM A1c 7.4 SSI  BPH Proscar, flomax    DVT prophylaxis: heparin Code Status: full Family Communication: none Disposition:   Status is: Inpatient Remains inpatient appropriate because: pending further improvement, urine culture results   Consultants:  none  Procedures:  none  Antimicrobials:  Anti-infectives (From admission, onward)    Start     Dose/Rate Route Frequency Ordered Stop   04/05/22 2200  meropenem (MERREM) 500 mg in sodium chloride 0.9 % 100 mL IVPB  Status:  Discontinued        500 mg 200 mL/hr over 30 Minutes Intravenous Every 12 hours 04/05/22 1307 04/06/22 1703   04/05/22 0000  imipenem-cilastatin (PRIMAXIN) 500 mg in sodium chloride 0.9 % 100 mL IVPB  Status:  Discontinued        500 mg 200 mL/hr over 30 Minutes Intravenous Every 12 hours 04/04/22 2314 04/05/22 1307       Subjective: Complains of lightheadedness  Objective: Vitals:   04/07/22 1313 04/07/22 1940 04/08/22 0622 04/08/22 1231  BP: (!) 165/92 (!) 147/85 (!) 162/85 (!) 161/89  Pulse: 70 69 68 71  Resp: 16 18 18 16   Temp: 98.1 F (36.7 C) 97.9 F (36.6 C) 98.2 F (36.8 C) 97.9 F (36.6 C)  TempSrc: Oral Oral Oral Oral  SpO2: 100% 100% 100% 100%  Weight:  Height:        Intake/Output Summary (Last 24 hours) at 04/08/2022 1352 Last data filed at 04/08/2022 0800 Gross per 24 hour  Intake 360 ml  Output 1350 ml  Net -990 ml   Filed Weights   04/04/22 1420  Weight: 96.8 kg    Examination:  General: No acute distress. Cardiovascular: RRR Lungs: unlabored, CTAB Abdomen: Soft, nontender, nondistended  Neurological: Alert and oriented 3. Moves all extremities 4 with equal strength.  Cranial nerves II through XII grossly intact. Extremities: No clubbing or cyanosis. No edema.  Data Reviewed: I have personally reviewed following labs and imaging studies  CBC: Recent Labs  Lab 04/04/22 1434 04/05/22 0322 04/06/22 0542 04/07/22 0532 04/08/22 0455  WBC 6.6 6.5 4.7 4.9 5.1  NEUTROABS 4.2 4.0 2.3  --  2.5  HGB 11.8* 11.8* 10.4* 10.8* 10.8*  HCT 36.2* 36.6* 32.4* 34.1* 33.1*  MCV 91.0 90.6 91.5 91.9 89.5  PLT 516* 484* 421* 386 [id number]    Basic Metabolic Panel: Recent Labs  Lab 04/04/22 1434 04/05/22 0322 04/06/22 0542 04/07/22 0532 04/08/22 0455  NA 138 138 137 136 137  K 4.0 3.6 3.6 3.7 3.6  CL 109 108 105 104 103  CO2 23 24 22  20* 23  GLUCOSE 132* 113* 77 97 101*  BUN 40* 37* 40* 41* 45*  CREATININE 4.17* 3.92* 4.37* 4.49* 4.96*  CALCIUM 8.6* 8.7* 8.5* 8.8* 8.6*  MG  --   --  1.9 1.9 2.2  PHOS  --   --  4.3 5.0* 5.3*    GFR: Estimated Creatinine Clearance: 18.2 mL/min (Doretha Goding) (by C-G formula based on SCr of 4.96 mg/dL (H)).  Liver Function Tests: Recent Labs  Lab 04/04/22 1434 04/06/22 0542 04/08/22 0455  AST 14* 13* 16  ALT 8 8 10   ALKPHOS 60 48 53  BILITOT 0.7 0.5 0.5  PROT 8.3* 7.1 7.2  ALBUMIN 3.9 3.3* 3.3*    CBG: Recent Labs  Lab 04/07/22 1226 04/07/22 1708 04/07/22 2020 04/08/22 0754 04/08/22 1229  GLUCAP 145* 121* 152* 122* 121*     Recent Results (from the past 240 hour(s))  Resp panel by RT-PCR (RSV, Flu Courtnee Myer&B, Covid) Anterior Nasal Swab     Status: None   Collection Time: 04/04/22  4:58 PM   Specimen: Anterior Nasal Swab  Result Value Ref Range Status   SARS Coronavirus 2 by RT PCR NEGATIVE NEGATIVE Final    Comment: (NOTE) SARS-CoV-2 target nucleic acids are NOT DETECTED.  The SARS-CoV-2 RNA is generally detectable in upper respiratory specimens during the acute phase of infection. The lowest concentration of SARS-CoV-2 viral copies this assay can detect is 138 copies/mL. Andres Escandon negative result does not preclude  SARS-Cov-2 infection and should not be used as the sole basis for treatment or other patient management decisions. Seichi Kaufhold negative result may occur with  improper specimen collection/handling, submission of specimen other than nasopharyngeal swab, presence of viral mutation(s) within the areas targeted by this assay, and inadequate number of viral copies(<138 copies/mL). Samanth Mirkin negative result must be combined with clinical observations, patient history, and epidemiological information. The expected result is Negative.  Fact Sheet for Patients:  EntrepreneurPulse.com.au  Fact Sheet for Healthcare Providers:  IncredibleEmployment.be  This test is no t yet approved or cleared by the [place] FDA and  has been authorized for detection and/or diagnosis of SARS-CoV-2 by FDA under an Emergency Use Authorization (EUA). This EUA will remain  in effect (meaning this test can be used) for  the duration of the COVID-19 declaration under Section 564(b)(1) of the Act, 21 U.S.C.section 360bbb-3(b)(1), unless the authorization is terminated  or revoked sooner.       Influenza Brieana Shimmin by PCR NEGATIVE NEGATIVE Final   Influenza B by PCR NEGATIVE NEGATIVE Final    Comment: (NOTE) The Xpert Xpress SARS-CoV-2/FLU/RSV plus assay is intended as an aid in the diagnosis of influenza from Nasopharyngeal swab specimens and should not be used as Steve Youngberg sole basis for treatment. Nasal washings and aspirates are unacceptable for Xpert Xpress SARS-CoV-2/FLU/RSV testing.  Fact Sheet for Patients: EntrepreneurPulse.com.au  Fact Sheet for Healthcare Providers: IncredibleEmployment.be  This test is not yet approved or cleared by the [place] FDA and has been authorized for detection and/or diagnosis of SARS-CoV-2 by FDA under an Emergency Use Authorization (EUA). This EUA will remain in effect (meaning this test can be used) for the duration of  the COVID-19 declaration under Section 564(b)(1) of the Act, 21 U.S.C. section 360bbb-3(b)(1), unless the authorization is terminated or revoked.     Resp Syncytial Virus by PCR NEGATIVE NEGATIVE Final    Comment: (NOTE) Fact Sheet for Patients: EntrepreneurPulse.com.au  Fact Sheet for Healthcare Providers: IncredibleEmployment.be  This test is not yet approved or cleared by the [place] FDA and has been authorized for detection and/or diagnosis of SARS-CoV-2 by FDA under an Emergency Use Authorization (EUA). This EUA will remain in effect (meaning this test can be used) for the duration of the COVID-19 declaration under Section 564(b)(1) of the Act, 21 U.S.C. section 360bbb-3(b)(1), unless the authorization is terminated or revoked.  Performed at [facility], [place] [address] [place], [place] [address]   Urine Culture (for pregnant, neutropenic or urologic patients or patients with an indwelling urinary catheter)     Status: Abnormal   Collection Time: 04/04/22  9:58 PM   Specimen: Urine, Clean Catch  Result Value Ref Range Status   Specimen Description   Final    URINE, CLEAN CATCH Performed at [facility], [place] [address], [place], [place] [address]    Special Requests   Final    NONE Performed at [facility], [place] [address], [place],  [address]    Culture (Mariea Mcmartin)  Final    >=100,000 COLONIES/mL KLEBSIELLA PNEUMONIAE Confirmed Extended Spectrum Beta-Lactamase Producer (ESBL).  In bloodstream infections from ESBL organisms, carbapenems are preferred over piperacillin/tazobactam. They are shown to have Reham Slabaugh lower risk of mortality.    Report Status 04/06/2022 FINAL  Final   Organism ID, Bacteria KLEBSIELLA PNEUMONIAE (Ruey Storer)  Final      Susceptibility   Klebsiella pneumoniae - MIC*    AMPICILLIN >=32 RESISTANT Resistant     CEFAZOLIN >=64 RESISTANT Resistant     CEFEPIME >=32  RESISTANT Resistant     CEFTRIAXONE >=64 RESISTANT Resistant     CIPROFLOXACIN >=4 RESISTANT Resistant     GENTAMICIN <=1 SENSITIVE Sensitive     IMIPENEM <=0.25 SENSITIVE Sensitive     NITROFURANTOIN 64 INTERMEDIATE Intermediate     TRIMETH/SULFA >=320 RESISTANT Resistant     AMPICILLIN/SULBACTAM 16 INTERMEDIATE Intermediate     PIP/TAZO 8 SENSITIVE Sensitive     * >=100,000 COLONIES/mL KLEBSIELLA PNEUMONIAE  Culture, blood (Routine X 2) w Reflex to ID Panel     Status: None (Preliminary result)   Collection Time: 04/05/22  3:22 AM   Specimen: BLOOD  Result Value Ref Range Status   Specimen Description   Final    BLOOD BLOOD RIGHT  ARM Performed at [facility], [place] [address] [place], [place] [address]    Special Requests   Final    BOTTLES DRAWN AEROBIC ONLY Blood Culture adequate volume Performed at [place] [address], [place], [place] [address]    Culture   Final    NO GROWTH 3 DAYS Performed at [facility], [place] [address] [place], [place] [address]    Report Status PENDING  Incomplete  Culture, blood (Routine X 2) w Reflex to ID Panel     Status: None (Preliminary result)   Collection Time: 04/05/22  3:22 AM   Specimen: BLOOD  Result Value Ref Range Status   Specimen Description   Final    BLOOD BLOOD RIGHT HAND Performed at [place] [address], [place], [place] [address]    Special Requests   Final    BOTTLES DRAWN AEROBIC ONLY Blood Culture adequate volume Performed at [place] [address] [place], [place] [address]    Culture   Final    NO GROWTH 3 DAYS Performed at [facility], [place] [address], [place], [place] [address]    Report Status PENDING  Incomplete         Radiology Studies: NM GASTRIC EMPTYING  Result Date: 04/06/2022 CLINICAL DATA:  Nausea and vomiting for 4 days, diabetes EXAM: NUCLEAR MEDICINE GASTRIC EMPTYING SCAN TECHNIQUE:  After oral ingestion of radiolabeled meal, sequential abdominal images were obtained for 4 hours. Percentage of activity emptying the stomach was calculated at 1 hour, 2 hour, 3 hour, and 4 hours. RADIOPHARMACEUTICALS:  2.2 mCi Tc-25m sulfur colloid in standardized meal COMPARISON:  CT 04/04/2022 FINDINGS: Expected location of the stomach in the left upper quadrant. Ingested meal empties the stomach gradually over the course of the study. 17% emptied at 1 hr ( normal >= 10%) 32% emptied at 2 hr ( normal >= 40%) 47% emptied at 3 hr ( normal >= 70%) 62% emptied at 4 hr ( normal >= 90%) IMPRESSION: Delayed gastric emptying study. Electronically Signed   By: Randa Ngo M.D.   On: 04/06/2022 16:22        Scheduled Meds:  amLODipine  10 mg Oral Daily   calcitRIOL  0.25 mcg Oral Daily   clopidogrel  75 mg Oral Daily   famotidine  10 mg Oral Daily   finasteride  5 mg Oral Daily   heparin  5,000 Units Subcutaneous Q8H   insulin aspart  0-15 Units Subcutaneous TID WC   metoCLOPramide  5 mg Oral TID AC & HS   metoprolol succinate  12.5 mg Oral Daily   polyethylene glycol  17 g Oral BID   rosuvastatin  10 mg Oral Daily   senna-docusate  1 tablet Oral QHS   sodium bicarbonate  650 mg Oral BID   tamsulosin  0.4 mg Oral Daily   Continuous Infusions:  lactated ringers        LOS: 4 days    Time spent: over 30 min    Fayrene Helper, MD [facility]   To contact the attending provider between 7A-7P or the covering provider during after hours 7P-7A, please log into the web site [email or web address] and access using universal [place] password for that web site. If you do not have the password, please call the hospital operator.  04/08/2022, 1:52 PM

## 2022-04-08 NOTE — Evaluation (Signed)
Occupational Therapy Evaluation/Discharge  Patient Details Name: Ryan Jenkins MRN: [id number] DOB: [birth year] Today's Date: 04/08/2022   History of Present Illness 62 yo male admitted with UTI. Recent d/c from [facility] 03/27/22. Hx of neuropathy, DM, CKD, HF   Clinical Impression   PTA, pt lives with brother, reports typically Modified Independent with ADLs and mobility (w/c inside apartment; Rollator outside apartment) and has aide assist 3x/wk for IADL tasks. Pt presents now near reported baseline, requiring no physical assistance for ADLs and in-room mobility with RW. Discussed pt precautions w/ neuropathy, fall prevention and energy conservation. Encouraged pt to work on mobility with Rollator in the home with [facility] therapies to maximize strength and endurance. Pt interested in resuming HHPT/OT at DC. All needs can be met with [facility] therapy follow up and no further acute OT needs at this time.      Recommendations for follow up therapy are one component of a multi-disciplinary discharge planning process, led by the attending physician.  Recommendations may be updated based on patient status, additional functional criteria and insurance authorization.   Follow Up Recommendations  Home health OT (continuation of [place])     Assistance Recommended at Discharge Intermittent Supervision/Assistance  Patient can return home with the following A little help with walking and/or transfers;A little help with bathing/dressing/bathroom;Assistance with cooking/housework;Assist for transportation;Help with stairs or ramp for entrance    Functional Status Assessment  Patient has not had a recent decline in their functional status  Equipment Recommendations  None recommended by OT    Recommendations for Other Services       Precautions / Restrictions Precautions Precautions: Fall Restrictions Weight Bearing Restrictions: No      Mobility Bed Mobility Overal bed mobility: Modified Independent                   Transfers Overall transfer level: Modified independent Equipment used: Rolling walker (2 wheels) Transfers: Sit to/from Stand Sit to Stand: Modified independent (Device/Increase time)                  Balance Overall balance assessment: Needs assistance Sitting-balance support: No upper extremity supported, Feet unsupported Sitting balance-Leahy Scale: Good     Standing balance support: During functional activity, Reliant on assistive device for balance Standing balance-Leahy Scale: Poor Standing balance comment: reliant on at least one UE support in standing at all times; deferred no UE support for grooming standing at sink                           ADL either performed or assessed with clinical judgement   ADL Overall ADL's : Needs assistance/impaired Eating/Feeding: Independent;Sitting   Grooming: Supervision/safety;Standing;Wash/dry face Grooming Details (indicate cue type and reason): using R hand only, kept L UE on RW handle for entire task - reports not comfortable removing UE from RW Upper Body Bathing: Set up;Sitting   Lower Body Bathing: Supervison/ safety;Sit to/from stand;Sitting/lateral leans   Upper Body Dressing : Sitting;Set up   Lower Body Dressing: Supervision/safety;Sitting/lateral leans Lower Body Dressing Details (indicate cue type and reason): donning socks without issue Toilet Transfer: Supervision/safety;Ambulation;Rolling walker (2 wheels)   Toileting- Clothing Manipulation and Hygiene: Supervision/safety;Sitting/lateral lean;Sit to/from stand       Functional mobility during ADLs: Supervision/safety;Rolling walker (2 wheels) General ADL Comments: Discussed fall prevention, energy conservation as needed though pt denies fatigue with daily tasks with use of shower chair, etc. Discussed using Rollator in the apt sometimes when  comfortable to further progress mobility as pt typically using w/c.     Vision  Baseline Vision/History: 1 Wears glasses Ability to See in Adequate Light: 0 Adequate Patient Visual Report: No change from baseline Vision Assessment?: No apparent visual deficits     Perception     Praxis      Pertinent Vitals/Pain Pain Assessment Pain Assessment: No/denies pain Pain Intervention(s): Monitored during session     Hand Dominance Right   Extremity/Trunk Assessment Upper Extremity Assessment Upper Extremity Assessment: Overall WFL for tasks assessed   Lower Extremity Assessment Lower Extremity Assessment: Defer to PT evaluation   Cervical / Trunk Assessment Cervical / Trunk Assessment: Kyphotic Cervical / Trunk Exceptions: mild kyphosis/rounded shoulder/forward head   Communication Communication Communication: No difficulties   Cognition Arousal/Alertness: Awake/alert Behavior During Therapy: WFL for tasks assessed/performed Overall Cognitive Status: Within Functional Limits for tasks assessed                                       General Comments       Exercises     Shoulder Instructions      Home Living Family/patient expects to be discharged to:: Private residence Living Arrangements: Other relatives (brother) Available Help at Discharge: Family;Available PRN/intermittently;Personal care attendant Type of Home: Apartment Home Access: Level entry     Home Layout: One level     Bathroom Shower/Tub: Teacher, early years/pre: Standard Bathroom Accessibility: Yes How Accessible: Accessible via wheelchair Home Equipment: [place] (2 wheels);Rollator (4 wheels);BSC/3in1;Wheelchair - manual;Shower seat   Additional Comments: brother works 5a-2p      Prior Functioning/Environment Prior Level of Function : Needs assist             Mobility Comments: mostly uses WC in the home, rollator outside of the home; could ambulate in community but fatigues; was getting HHPT ADLs Comments: has PCA 3x a week to  assist with IADLs. reports MOD I for ADLs, uses shower chair, can go into kitchen for simple meals if needed but brother also brings meals home after work. pt does not drive. reports working with [place] on grip strength due to neuropathy        OT Problem List: Decreased strength;Decreased range of motion;Decreased activity tolerance;Impaired balance (sitting and/or standing);Decreased safety awareness;Decreased knowledge of use of DME or AE;Decreased knowledge of precautions      OT Treatment/Interventions:      OT Goals(Current goals can be found in the care plan section) Acute Rehab OT Goals Patient Stated Goal: home today, get tooth fixed OT Goal Formulation: All assessment and education complete, DC therapy  OT Frequency:      Co-evaluation              AM-PAC OT "6 Clicks" Daily Activity     Outcome Measure Help from another person eating meals?: None Help from another person taking care of personal grooming?: None Help from another person toileting, which includes using toliet, bedpan, or urinal?: A Little Help from another person bathing (including washing, rinsing, drying)?: A Little Help from another person to put on and taking off regular upper body clothing?: None Help from another person to put on and taking off regular lower body clothing?: A Little 6 Click Score: 21   End of Session Equipment Utilized During Treatment: Rolling walker (2 wheels);Gait belt Nurse Communication: Mobility status  Activity Tolerance: Patient tolerated treatment well  Patient left: in chair;with call bell/phone within reach  OT Visit Diagnosis: Unsteadiness on feet (R26.81);Other abnormalities of gait and mobility (R26.89);Muscle weakness (generalized) (M62.81);History of falling (Z91.81)                Time: [id number] OT Time Calculation (min): 23 min Charges:  OT General Charges $OT Visit: 1 Visit OT Evaluation $OT Eval Low Complexity: 1 Low  Malachy Chamber, OTR/L Acute Rehab  Services Office: [phone]   Layla Maw 04/08/2022, 7:55 AM

## 2022-04-08 NOTE — Plan of Care (Signed)

## 2022-04-08 NOTE — Progress Notes (Signed)
NUTRITION NOTE RD working remotely.  RD consulted for nutrition education regarding gastroparesis.   Lab Results  Component Value Date   HGBA1C 7.4 (H) 03/22/2022    RD entered "Carbohydrate Counting for People with Diabetes" and "Gastroparesis Nutrition Therapy" handouts from the Academy of Nutrition and Dietetics into the AVS.   Body mass index is 27.4 kg/m. Pt meets criteria for overweight based on current BMI.  Current diet order is Carb Modified, patient is consuming approximately 100% of meals at this time. Labs and medications reviewed. No further nutrition interventions warranted at this time. RD contact information provided. If additional nutrition issues arise, please re-consult RD.     Jarome Matin, MS, RD, LDN, CNSC Clinical Dietitian PRN/Relief staff On-call/weekend pager # available in [facility]

## 2022-04-09 DIAGNOSIS — E1143 Type 2 diabetes mellitus with diabetic autonomic (poly)neuropathy: Secondary | ICD-10-CM | POA: Insufficient documentation

## 2022-04-09 DIAGNOSIS — Z1612 Extended spectrum beta lactamase (ESBL) resistance: Secondary | ICD-10-CM | POA: Diagnosis not present

## 2022-04-09 DIAGNOSIS — N390 Urinary tract infection, site not specified: Secondary | ICD-10-CM | POA: Diagnosis not present

## 2022-04-09 DIAGNOSIS — B9629 Other Escherichia coli [E. coli] as the cause of diseases classified elsewhere: Secondary | ICD-10-CM | POA: Diagnosis not present

## 2022-04-09 LAB — BASIC METABOLIC PANEL
Anion gap: 12 (ref 5–15)
BUN: 46 mg/dL — ABNORMAL HIGH (ref 8–23)
CO2: 24 mmol/L (ref 22–32)
Calcium: 8.3 mg/dL — ABNORMAL LOW (ref 8.9–10.3)
Chloride: 100 mmol/L (ref 98–111)
Creatinine, Ser: 5.12 mg/dL — ABNORMAL HIGH (ref 0.61–1.24)
GFR, Estimated: 12 mL/min — ABNORMAL LOW (ref >60)
Glucose, Bld: 95 mg/dL (ref 70–99)
Potassium: 3.7 mmol/L (ref 3.5–5.1)
Sodium: 136 mmol/L (ref 135–145)

## 2022-04-09 LAB — CBC
HCT: 33 % — ABNORMAL LOW (ref 39.0–52.0)
Hemoglobin: 11 g/dL — ABNORMAL LOW (ref 13.0–17.0)
MCH: 29.8 pg (ref 26.0–34.0)
MCHC: 33.3 g/dL (ref 30.0–36.0)
MCV: 89.4 fL (ref 80.0–100.0)
Platelets: 346 10*3/uL (ref 150–400)
RBC: 3.69 MIL/uL — ABNORMAL LOW (ref 4.22–5.81)
RDW: 14.7 % (ref 11.5–15.5)
WBC: 5.1 10*3/uL (ref 4.0–10.5)
nRBC: 0 % (ref 0.0–0.2)

## 2022-04-09 LAB — PHOSPHORUS: Phosphorus: 6.2 mg/dL — ABNORMAL HIGH (ref 2.5–4.6)

## 2022-04-09 LAB — GLUCOSE, CAPILLARY: Glucose-Capillary: 100 mg/dL — ABNORMAL HIGH (ref 70–99)

## 2022-04-09 LAB — MAGNESIUM: Magnesium: 2.3 mg/dL (ref 1.7–2.4)

## 2022-04-09 MED ORDER — METOCLOPRAMIDE HCL 5 MG PO TABS: 5.0000 mg | ORAL_TABLET | Freq: Three times a day (TID) | ORAL | 0 refills | Status: DC

## 2022-04-09 MED ORDER — FAMOTIDINE 10 MG PO TABS: 10.0000 mg | ORAL_TABLET | Freq: Every day | ORAL | 0 refills | Status: DC

## 2022-04-09 MED ORDER — CLOPIDOGREL BISULFATE 75 MG PO TABS: 75.0000 mg | ORAL_TABLET | Freq: Every day | ORAL | 0 refills | Status: AC

## 2022-04-09 MED ORDER — CALCITRIOL 0.25 MCG PO CAPS: 0.2500 ug | ORAL_CAPSULE | Freq: Every day | ORAL | 0 refills | Status: AC

## 2022-04-09 NOTE — Discharge Summary (Signed)
Physician Discharge Summary  Ryan Jenkins [id number] DOB: [birth year] DOA: 04/04/2022  PCP: Ryan Coma., MD  Admit date: 04/04/2022 Discharge date: 04/09/2022  Time spent: 40 minutes  Recommendations for Outpatient Follow-up:  Follow outpatient CBC/CMP  Follow kidney function with renal outpatient Follow nausea/vomiting - gastroparesis symtpoms - discharged on reglan for 2 weeks, then can transition to prn Suspect his ESBL klebsiella is colonization, asx bacteruria Orthostasis day prior to discharge, resolved now - follow symptoms outpatient - may need BP meds adjusted I refilled plavix - follow outpatient, not clear to me indication for plavix over other antiplatelet like aspirin? Follow with cards Stop glipizide, follow BG's outpatient, continue lantus for now  Discharge Diagnoses:  Principal Problem:   UTI due to extended-spectrum beta lactamase (ESBL) producing Escherichia coli Active Problems:   Diabetic neuropathy, type II diabetes mellitus (HCC)   DM2 (diabetes mellitus, type 2) (HCC)   Essential hypertension   Depression   HLD (hyperlipidemia)   CKD (chronic kidney disease) stage 4, GFR 15-29 ml/min (HCC)   BPH (benign prostatic hyperplasia)   UTI (urinary tract infection)   Chronic diastolic heart failure ([place])   Discharge Condition: stable  Diet recommendation: heart healthy, diabetic  Filed Weights   04/04/22 1420  Weight: 96.8 kg    History of present illness:   62y/o male with PMHx significant for DM2, HTN, HLD, CAD, chronic combined systolic and diastolic CHF, CKD 4, diabetic neuropathy, GERD, chronic anemia, anxiety/depression, arthritis, BPH, vitamin B12 deficiency, impaired mobility.  He was recently admitted 3/6-318 with diagnosis of ESBL Klebsiella pneumonia and mild CHF exacerbation.  Treated with 7 days IV meropenem, discharged 2 days ago. Of note patient grown ESBL Klebsiella in previous cultures 11/2021 and 12/2021. As patient was  asymptomatic, these were not treated at that time.     Nausea and vomiting resumed the day after discharge.  This been ongoing the last 3 days.  Gastric emptying study with delayed emptying.  Will treat for gastroparesis.  Hospital Course:  Assessment and Plan:  Nausea, Vomiting, Abdominal Pain due to Diabetic Gastroparesis Delayed gastric emptying study Doing well with reglan, schedule for 2 weeks, then as needed Gastroparesis diet education   Asymptomatic Bacteruria  ESBL Klebsiella I think UTI less likely cause of symptoms above, he doesn't have pyelo without CVA tenderness or imaging findings c/w that, so N/V related to UTI would be less likely.  Will stop abx and monitor with treatment of above.   CKD IV Creatinine appears to be at baseline Continue bicarb, calcitriol Lasix  Avoid nephrotoxins Creatinine rising, needs outpatient follow up with renal - has fluctuated rather widely over past few weeks   Orthostasis Positive by BP and w/ complaint of LH Resolved without intervention (prior to IVF yesterday) Will follow outpatient, change positions slowly   CAD  HLD Continue plavix refilled at discharge, it's not immediately clear to me why he's on plavix (rather than aspirin) - follow with cards outpatient  Continue metoprolol Crestor    HFpEF Continue lasix, metop   T2DM A1c 7.4 SSI   BPH Proscar, flomax     Procedures: none   Consultations: none  Discharge Exam: Vitals:   04/08/22 1923 04/09/22 0449  BP: (!) 160/93 (!) 158/106  Pulse: 77 81  Resp: 17 17  Temp: 98.4 F (36.9 C) (!) 97.3 F (36.3 C)  SpO2: 100% 100%   No complaints Eager to discharge - asks me to refill plavix and calcitriol  General: No acute distress.  Lungs: unlabored Neurological: Alert and oriented 3. Moves all extremities 4 with equal strength. Cranial nerves II through XII grossly intact. Extremities: No clubbing or cyanosis. No edema.   Discharge  Instructions   Discharge Instructions     Call MD for:  difficulty breathing, headache or visual disturbances   Complete by: As directed    Call MD for:  extreme fatigue   Complete by: As directed    Call MD for:  hives   Complete by: As directed    Call MD for:  persistant dizziness or light-headedness   Complete by: As directed    Call MD for:  persistant nausea and vomiting   Complete by: As directed    Call MD for:  redness, tenderness, or signs of infection (pain, swelling, redness, odor or green/yellow discharge around incision site)   Complete by: As directed    Call MD for:  severe uncontrolled pain   Complete by: As directed    Call MD for:  temperature >100.4   Complete by: As directed    Diet - low sodium heart healthy   Complete by: As directed    Discharge instructions   Complete by: As directed    You were seen for abdominal discomfort, nausea, and vomiting.  I think this was due to diabetic gastroparesis (Ryan Jenkins complication of diabetes related to the nerves in your gut).  We'll start you on reglan.  Take this with meals and at bedtime.  Don't schedule this longer than 2 weeks.  If your symptoms improve prior to 2 weeks, go ahead and take this medicine only as needed at meals and bedtime for nausea.  Eating smaller and more frequent meals can often help with gastroparesis as well.  Your creatinine has fluctuated widely over the past few weeks.  Please follow up with Ryan Jenkins.  Resume your lasix at home.  I refilled your calcitriol.  Follow up with your kidney doctor outpatient for further refills.  I refilled your plavix. Please follow up with cardiology outpatient for further refills.  Stop your glipizide.  Continue the lantus.  If you're not eating well due to nausea and vomiting, the lantus can be held.  Stop the lisinopril.  Return for new, recurrent, or worsening symptoms.  Please ask your PCP to request records from this hospitalization so they know what was  done and what the next steps will be.   Increase activity slowly   Complete by: As directed       Allergies as of 04/09/2022       Reactions   Claritin [loratadine] Swelling, Other (See Comments)   Joint swelling   Hydrochlorothiazide Other (See Comments)   Dizziness   Latex Hives, Swelling   Lyrica [pregabalin] Other (See Comments)   Depression. "Makes me loopy"    Metformin And Related Diarrhea, Nausea And Vomiting        Medication List     STOP taking these medications    glipiZIDE 5 MG tablet Commonly known as: GLUCOTROL   lisinopril 10 MG tablet Commonly known as: ZESTRIL   potassium chloride 10 MEQ tablet Commonly known as: KLOR-CON M       TAKE these medications    amLODipine 10 MG tablet Commonly known as: NORVASC Take 1 tablet (10 mg total) by mouth daily.   calcitRIOL 0.25 MCG capsule Commonly known as: ROCALTROL Take 1 capsule (0.25 mcg total) by mouth daily. Follow up with your kidney doctors for refills What changed: additional instructions  clopidogrel 75 MG tablet Commonly known as: PLAVIX Take 1 tablet (75 mg total) by mouth daily. Follow up with your PCP for additional refills What changed: additional instructions   famotidine 10 MG tablet Commonly known as: PEPCID Take 1 tablet (10 mg total) by mouth daily. What changed:  medication strength how much to take when to take this   finasteride 5 MG tablet Commonly known as: PROSCAR Take 1 tablet (5 mg total) by mouth daily.   furosemide 80 MG tablet Commonly known as: Lasix Take 1 tablet (80 mg total) by mouth 2 (two) times daily.   Lantus SoloStar 100 UNIT/ML Solostar Pen Generic drug: insulin glargine Inject 5 Units into the skin at bedtime.   lidocaine 4 % Place 1 patch onto the skin daily as needed for up to 28 days (pain).   meclizine 25 MG tablet Commonly known as: ANTIVERT Take 25 mg by mouth daily as needed for dizziness.   metoCLOPramide 5 MG tablet Commonly  known as: REGLAN Take 1 tablet (5 mg total) by mouth 4 (four) times daily -  before meals and at bedtime for 14 days. After 2 weeks, follow up with your PCP for further recommendations on how to take.  If your nausea improves prior to completing the 2 week course, you can use before meals and at bedtime only as needed.   metoprolol succinate 25 MG 24 hr tablet Commonly known as: TOPROL-XL Take 0.5 tablets (12.5 mg total) by mouth daily.   oxyCODONE 5 MG immediate release tablet Commonly known as: Oxy IR/ROXICODONE Take 5 mg by mouth every 8 (eight) hours as needed (for pain).   polyethylene glycol 17 g packet Commonly known as: MIRALAX / GLYCOLAX Take 17 g by mouth daily.   rosuvastatin 10 MG tablet Commonly known as: CRESTOR Take 10 mg by mouth daily.   senna-docusate 8.6-50 MG tablet Commonly known as: Senokot-S Take 1 tablet by mouth at bedtime. What changed:  when to take this reasons to take this   sodium bicarbonate 650 MG tablet Take 1,300 mg by mouth 2 (two) times daily.   tamsulosin 0.4 MG Caps capsule Commonly known as: FLOMAX Take 0.4 mg by mouth at bedtime.   triamcinolone ointment 0.5 % Commonly known as: KENALOG Apply 1 Application topically 2 (two) times daily as needed (for itching).   TYLENOL 500 MG tablet Generic drug: acetaminophen Take 500-1,000 mg by mouth every 6 (six) hours as needed for mild pain or headache.   Vitamin D3 1.25 MG (50000 UT) capsule Generic drug: Cholecalciferol Take 50,000 Units by mouth every Friday.       Allergies  Allergen Reactions   Claritin [Loratadine] Swelling and Other (See Comments)    Joint swelling    Hydrochlorothiazide Other (See Comments)    Dizziness   Latex Hives and Swelling   Lyrica [Pregabalin] Other (See Comments)    Depression. "Makes me loopy"    Metformin And Related Diarrhea and Nausea And Vomiting      The results of significant diagnostics from this hospitalization (including imaging,  microbiology, ancillary and laboratory) are listed below for reference.    Significant Diagnostic Studies: NM GASTRIC EMPTYING  Result Date: 04/06/2022 CLINICAL DATA:  Nausea and vomiting for 4 days, diabetes EXAM: NUCLEAR MEDICINE GASTRIC EMPTYING SCAN TECHNIQUE: After oral ingestion of radiolabeled meal, sequential abdominal images were obtained for 4 hours. Percentage of activity emptying the stomach was calculated at 1 hour, 2 hour, 3 hour, and 4 hours. RADIOPHARMACEUTICALS:  2.2  mCi Tc-66m sulfur colloid in standardized meal COMPARISON:  CT 04/04/2022 FINDINGS: Expected location of the stomach in the left upper quadrant. Ingested meal empties the stomach gradually over the course of the study. 17% emptied at 1 hr ( normal >= 10%) 32% emptied at 2 hr ( normal >= 40%) 47% emptied at 3 hr ( normal >= 70%) 62% emptied at 4 hr ( normal >= 90%) IMPRESSION: Delayed gastric emptying study. Electronically Signed   By: Randa Ngo M.D.   On: 04/06/2022 16:22   CT ABDOMEN PELVIS WO CONTRAST  Result Date: 04/04/2022 CLINICAL DATA:  Abdominal pain, acute, nonlocalized. EXAM: CT ABDOMEN AND PELVIS WITHOUT CONTRAST TECHNIQUE: Multidetector CT imaging of the abdomen and pelvis was performed following the standard protocol without IV contrast. RADIATION DOSE REDUCTION: This exam was performed according to the departmental dose-optimization program which includes automated exposure control, adjustment of the mA and/or kV according to patient size and/or use of iterative reconstruction technique. COMPARISON:  CT 10/31/2021 FINDINGS: Lower chest: Lung bases are clear.  No pleural effusions. Hepatobiliary: Normal appearance of the liver and gallbladder. Pancreas: Unremarkable. No pancreatic ductal dilatation or surrounding inflammatory changes. Spleen: Normal in size without focal abnormality. Adrenals/Urinary Tract: Normal adrenal glands. Bilateral perinephric stranding is similar to the previous examination. Negative  for kidney stones or hydronephrosis. Bladder is mildly distended with diffuse bladder wall thickening and this is similar to the previous examination. Stomach/Bowel: Normal appearance of the stomach. Normal appearance of the appendix without inflammatory changes. No evidence for bowel dilatation or focal bowel inflammation. Vascular/Lymphatic: Mild atherosclerotic disease in the aorta and visceral arteries without aortic aneurysm. Proximal femoral arteries are heavily calcified. No lymph node enlargement in the abdomen or pelvis. Reproductive: Changes along the anterior central aspect of prostate compatible with history Daveon Arpino TURP procedure. Again noted are heavily calcified vas deferens. Other: Negative for free fluid. Small umbilical hernia containing fat. Negative for free air. Musculoskeletal: No acute bone abnormality. IMPRESSION: 1. No acute abnormality in the abdomen or pelvis. 2. Mild diffuse bladder wall thickening and bilateral perinephric stranding is similar to the previous examination. Negative for hydronephrosis or urinary stones. Bladder wall thickening could be related to chronic bladder outlet obstruction. The perinephric stranding is nonspecific and could also represent chronic changes. 3. Small umbilical hernia containing fat. 4. Aortic Atherosclerosis (ICD10-I70.0). Electronically Signed   By: Markus Daft M.D.   On: 04/04/2022 19:19   CT Head Wo Contrast  Result Date: 04/04/2022 CLINICAL DATA:  Headache. EXAM: CT HEAD WITHOUT CONTRAST TECHNIQUE: Contiguous axial images were obtained from the base of the skull through the vertex without intravenous contrast. RADIATION DOSE REDUCTION: This exam was performed according to the departmental dose-optimization program which includes automated exposure control, adjustment of the mA and/or kV according to patient size and/or use of iterative reconstruction technique. COMPARISON:  July 09, 2021 FINDINGS: Brain: There is mild cerebral atrophy with widening  of the extra-axial spaces and ventricular dilatation. There are areas of decreased attenuation within the white matter tracts of the supratentorial brain, consistent with microvascular disease changes. Vascular: No hyperdense vessel or unexpected calcification. Skull: Normal. Negative for fracture or focal lesion. Sinuses/Orbits: No acute finding. Other: None. IMPRESSION: 1. No acute intracranial abnormality. 2. Generalized cerebral atrophy and microvascular disease changes of the supratentorial brain. Electronically Signed   By: Virgina Norfolk M.D.   On: 04/04/2022 17:31   DG ABD ACUTE 2+V W 1V CHEST  Result Date: 04/04/2022 CLINICAL DATA:  Abdominal pain.  Vomiting  EXAM: DG ABDOMEN ACUTE WITH 1 VIEW CHEST COMPARISON:  X-ray 03/24/2022 and CT chest.  Older exams as well FINDINGS: No consolidation, pneumothorax or effusion. No edema. Normal cardiopericardial silhouette. Overlapping cardiac leads. Gas is seen in nondilated loops of small and large bowel. No obstruction. Minimal small bowel gas. No obvious free air seen beneath the diaphragm on the upright view. Scattered colonic stool. Vas deferens calcifications in the pelvis. Separate vascular calcifications. IMPRESSION: Nonspecific bowel gas pattern with scattered stool. No acute cardiopulmonary disease Electronically Signed   By: Jill Side M.D.   On: 04/04/2022 15:40   US RENAL  Result Date: 03/28/2022 CLINICAL DATA:  [id number] AKI (acute kidney injury) ([place]) [id number] EXAM: RENAL / URINARY TRACT ULTRASOUND COMPLETE COMPARISON:  None Available. FINDINGS: Right Kidney: Renal measurements: 11.6 x 5.8 x 5.9 cm = volume: 207.2 mL. Increased renal cortical echogenicity. No hydronephrosis. Left Kidney: Renal measurements: 11.2 x 6.3 x 6.6 cm = volume: 245.5 mL. Increased renal cortical echogenicity. No hydronephrosis. Bladder: Mildly distended with circumferential bladder wall thickening. Other: None. IMPRESSION: No hydronephrosis. Increased renal cortical  echogenicity bilaterally as can be seen with medical renal disease. Mildly distended bladder with circumferential bladder wall thickening, could be due to under-distension or cystitis, recommend correlation with urinalysis. Electronically Signed   By: Maurine Simmering M.D.   On: 03/28/2022 16:59   MR LUMBAR SPINE WO CONTRAST  Result Date: 03/26/2022 CLINICAL DATA:  Back pain an EXAM: MRI LUMBAR SPINE WITHOUT CONTRAST TECHNIQUE: Multiplanar, multisequence MR imaging of the lumbar spine was performed. No intravenous contrast was administered. COMPARISON:  None Available. MRI of the lumbar spine from 06/04/2013 could not be retrieved. FINDINGS: Segmentation:  Standard. Alignment: Straightening of the normal lumbar lordosis. No listhesis. Vertebrae:  No fracture, evidence of discitis, or bone lesion. Conus medullaris and cauda equina: Conus extends to the L1 level. Conus and cauda equina appear normal. Paraspinal and other soft tissues: Negative. Disc levels: T12-L1: No significant disc bulge. No spinal canal stenosis or neural foraminal narrowing. L1-L2: No significant disc bulge. No spinal canal stenosis or neural foraminal narrowing. L2-L3: No significant disc bulge. No spinal canal stenosis or neural foraminal narrowing. L3-L4: No significant disc bulge. Mild facet arthropathy. No spinal canal stenosis or neural foraminal narrowing. L4-L5: Minimal disc bulge with right foraminal protrusion. Mild facet arthropathy. Narrowing of the lateral recesses. No spinal canal stenosis. Mild-to-moderate right and mild left neural foraminal narrowing. L5-S1: Mild disc bulge with left subarticular annular fissure. Mild facet arthropathy. No spinal canal stenosis or neural foraminal narrowing. IMPRESSION: 1. L4-L5 mild-to-moderate right and mild left neural foraminal narrowing. Narrowing of the lateral recesses at this level could affect the descending L5 nerve roots. 2. L5-S1 left subarticular annular fissure, which can be Jiovany Scheffel cause  of back pain. 3. Mild multilevel facet arthropathy, which can also be Jamyla Ard cause of back pain. Electronically Signed   By: Merilyn Baba M.D.   On: 03/26/2022 21:39   VAS [name] LOWER EXTREMITY VENOUS (DVT)  Result Date: 03/26/2022  Lower Venous DVT Study Patient Name:  WRANGLER KLEVEN  Date of Exam:   03/26/2022 Medical Rec #: [id number]          Accession #:    [id number] Date of Birth: 1960-07-23          Patient Gender: M Patient Age:   89 years Exam Location:  [facility] Procedure:      VAS [name] LOWER EXTREMITY VENOUS (DVT) Referring Phys: Brookie Wayment POWELL JR --------------------------------------------------------------------------------  Indications: Edema.  Limitations: Shadowing from arterial calcification. Comparison Study: Prior negative left LEV done 01/25/18 Performing Technologist: Sharion Dove RVS  Examination Guidelines: Rajinder Mesick complete evaluation includes B-mode imaging, spectral Doppler, color Doppler, and power Doppler as needed of all accessible portions of each vessel. Bilateral testing is considered an integral part of Trevon Strothers complete examination. Limited examinations for reoccurring indications may be performed as noted. The reflux portion of the exam is performed with the patient in reverse Trendelenburg.  +---------+---------------+---------+-----------+----------+--------------+ RIGHT    CompressibilityPhasicitySpontaneityPropertiesThrombus Aging +---------+---------------+---------+-----------+----------+--------------+ CFV      Full           Yes      Yes                                 +---------+---------------+---------+-----------+----------+--------------+ SFJ      Full                                                        +---------+---------------+---------+-----------+----------+--------------+ FV Prox  Full                                                        +---------+---------------+---------+-----------+----------+--------------+ FV Mid   Full            Yes      Yes                                 +---------+---------------+---------+-----------+----------+--------------+ FV DistalFull                                                        +---------+---------------+---------+-----------+----------+--------------+ PFV      Full                                                        +---------+---------------+---------+-----------+----------+--------------+ POP      Full           Yes      Yes                                 +---------+---------------+---------+-----------+----------+--------------+ PTV      Full                                                        +---------+---------------+---------+-----------+----------+--------------+ PERO     Full                                                        +---------+---------------+---------+-----------+----------+--------------+   +---------+---------------+---------+-----------+----------+--------------+  LEFT     CompressibilityPhasicitySpontaneityPropertiesThrombus Aging +---------+---------------+---------+-----------+----------+--------------+ CFV      Full           Yes      Yes                                 +---------+---------------+---------+-----------+----------+--------------+ SFJ      Full                                                        +---------+---------------+---------+-----------+----------+--------------+ FV Prox  Full                                                        +---------+---------------+---------+-----------+----------+--------------+ FV Mid   Full           Yes      Yes                                 +---------+---------------+---------+-----------+----------+--------------+ FV DistalFull                                                        +---------+---------------+---------+-----------+----------+--------------+ PFV      Full                                                         +---------+---------------+---------+-----------+----------+--------------+ POP      Full           Yes      Yes                                 +---------+---------------+---------+-----------+----------+--------------+ PTV      Full                                                        +---------+---------------+---------+-----------+----------+--------------+ PERO     Full                                                        +---------+---------------+---------+-----------+----------+--------------+     Summary: BILATERAL: - No evidence of deep vein thrombosis seen in the lower extremities, bilaterally. -No evidence of popliteal cyst, bilaterally.   *See table(s) above for measurements and observations. Electronically signed by Orlie Pollen on 03/26/2022 at 5:53:19 PM.    Final    CT CHEST  WO CONTRAST  Result Date: 03/24/2022 CLINICAL DATA:  Chest pain EXAM: CT CHEST WITHOUT CONTRAST TECHNIQUE: Multidetector CT imaging of the chest was performed following the standard protocol without IV contrast. RADIATION DOSE REDUCTION: This exam was performed according to the departmental dose-optimization program which includes automated exposure control, adjustment of the mA and/or kV according to patient size and/or use of iterative reconstruction technique. COMPARISON:  01/24/2022 FINDINGS: Cardiovascular: The heart size is normal. Trace pericardial effusion, stable. Coronary artery calcification is evident. Mild atherosclerotic calcification is noted in the wall of the thoracic aorta. Mediastinum/Nodes: Increased number of small mediastinal lymph nodes are similar to prior with no individual node meeting CT size criteria for pathologic enlargement. No evidence for gross hilar lymphadenopathy although assessment is limited by the lack of intravenous contrast on the current study. The esophagus has normal imaging features. There is no axillary lymphadenopathy. Lungs/Pleura: No focal airspace  consolidation. No suspicious pulmonary nodule or mass. Thickening of the left major fissure with 5 mm nodular component on 66/4, likely subpleural lymph node. No pulmonary edema or pleural effusion. Upper Abdomen: Unremarkable. Musculoskeletal: No worrisome lytic or sclerotic osseous abnormality. Dependent subcutaneous edema and skin thickening is similar to prior. IMPRESSION: 1. No acute findings in the chest. Specifically, no findings to explain the patient's history of chest pain. 2. Similar dependent subcutaneous edema and skin thickening suggests soft tissue edema. 3.  Aortic Atherosclerosis (ICD10-I70.0). Electronically Signed   By: Misty Stanley M.D.   On: 03/24/2022 15:58   DG CHEST PORT 1 VIEW  Result Date: 03/24/2022 CLINICAL DATA:  Chest pain EXAM: PORTABLE CHEST 1 VIEW COMPARISON:  Three hundred twenty-four FINDINGS: Single frontal view of the chest demonstrates an unremarkable cardiac silhouette. No airspace disease, effusion, or pneumothorax. No acute bony abnormalities. IMPRESSION: 1. No acute intrathoracic process. Electronically Signed   By: Randa Ngo M.D.   On: 03/24/2022 10:12   DG CHEST PORT 1 VIEW  Result Date: 03/23/2022 CLINICAL DATA:  Shortness of breath EXAM: PORTABLE CHEST 1 VIEW COMPARISON:  Chest radiograph 2 days prior FINDINGS: Cardiomegaly is unchanged. The upper mediastinal contours are stable. Previously seen vascular congestion/pulmonary edema has improved. There is no new or worsening focal airspace disease. There is no pleural effusion. There is no pneumothorax There is no acute osseous abnormality. IMPRESSION: Improved vascular congestion/pulmonary edema. No new or worsening focal airspace disease. Electronically Signed   By: Valetta Mole M.D.   On: 03/23/2022 08:17   DG Shoulder 1V Right  Result Date: 03/22/2022 CLINICAL DATA:  Right shoulder pain.  No known accident or injury. EXAM: RIGHT SHOULDER - 1 VIEW COMPARISON:  Right shoulder radiographs 12/10/2020.  FINDINGS: Single limited frontal view of the right shoulder. There are again small peripheral acromioclavicular degenerative spurs. No acute fracture is seen. No dislocation. The visualized portion of the right lung is unremarkable. IMPRESSION: Mild degenerative changes of the acromioclavicular joint. Electronically Signed   By: Yvonne Kendall M.D.   On: 03/22/2022 17:48   DG Chest Portable 1 View  Result Date: 03/21/2022 CLINICAL DATA:  Shortness of breath. EXAM: PORTABLE CHEST 1 VIEW COMPARISON:  Chest radiograph dated 01/24/2022 and CT dated 01/24/2022. FINDINGS: Mild cardiomegaly with mild vascular congestion. No focal consolidation, pleural effusion, pneumothorax. No acute osseous pathology. IMPRESSION: Mild cardiomegaly with mild vascular congestion. Electronically Signed   By: Anner Crete M.D.   On: 03/21/2022 20:17    Microbiology: Recent Results (from the past 240 hour(s))  Resp panel by RT-PCR (RSV, Flu Dilraj Killgore&B,  Covid) Anterior Nasal Swab     Status: None   Collection Time: 04/04/22  4:58 PM   Specimen: Anterior Nasal Swab  Result Value Ref Range Status   SARS Coronavirus 2 by RT PCR NEGATIVE NEGATIVE Final    Comment: (NOTE) SARS-CoV-2 target nucleic acids are NOT DETECTED.  The SARS-CoV-2 RNA is generally detectable in upper respiratory specimens during the acute phase of infection. The lowest concentration of SARS-CoV-2 viral copies this assay can detect is 138 copies/mL. Lielle Vandervort negative result does not preclude SARS-Cov-2 infection and should not be used as the sole basis for treatment or other patient management decisions. Leilanni Halvorson negative result may occur with  improper specimen collection/handling, submission of specimen other than nasopharyngeal swab, presence of viral mutation(s) within the areas targeted by this assay, and inadequate number of viral copies(<138 copies/mL). Angelita Harnack negative result must be combined with clinical observations, patient history, and  epidemiological information. The expected result is Negative.  Fact Sheet for Patients:  EntrepreneurPulse.com.au  Fact Sheet for Healthcare Providers:  IncredibleEmployment.be  This test is no t yet approved or cleared by the [place] FDA and  has been authorized for detection and/or diagnosis of SARS-CoV-2 by FDA under an Emergency Use Authorization (EUA). This EUA will remain  in effect (meaning this test can be used) for the duration of the COVID-19 declaration under Section 564(b)(1) of the Act, 21 U.S.C.section 360bbb-3(b)(1), unless the authorization is terminated  or revoked sooner.       Influenza Davon Folta by PCR NEGATIVE NEGATIVE Final   Influenza B by PCR NEGATIVE NEGATIVE Final    Comment: (NOTE) The Xpert Xpress SARS-CoV-2/FLU/RSV plus assay is intended as an aid in the diagnosis of influenza from Nasopharyngeal swab specimens and should not be used as Zaiya Annunziato sole basis for treatment. Nasal washings and aspirates are unacceptable for Xpert Xpress SARS-CoV-2/FLU/RSV testing.  Fact Sheet for Patients: EntrepreneurPulse.com.au  Fact Sheet for Healthcare Providers: IncredibleEmployment.be  This test is not yet approved or cleared by the [place] FDA and has been authorized for detection and/or diagnosis of SARS-CoV-2 by FDA under an Emergency Use Authorization (EUA). This EUA will remain in effect (meaning this test can be used) for the duration of the COVID-19 declaration under Section 564(b)(1) of the Act, 21 U.S.C. section 360bbb-3(b)(1), unless the authorization is terminated or revoked.     Resp Syncytial Virus by PCR NEGATIVE NEGATIVE Final    Comment: (NOTE) Fact Sheet for Patients: EntrepreneurPulse.com.au  Fact Sheet for Healthcare Providers: IncredibleEmployment.be  This test is not yet approved or cleared by the [place] FDA and has been  authorized for detection and/or diagnosis of SARS-CoV-2 by FDA under an Emergency Use Authorization (EUA). This EUA will remain in effect (meaning this test can be used) for the duration of the COVID-19 declaration under Section 564(b)(1) of the Act, 21 U.S.C. section 360bbb-3(b)(1), unless the authorization is terminated or revoked.  Performed at [facility], [place] [address] [place], [place] [address]   Urine Culture (for pregnant, neutropenic or urologic patients or patients with an indwelling urinary catheter)     Status: Abnormal   Collection Time: 04/04/22  9:58 PM   Specimen: Urine, Clean Catch  Result Value Ref Range Status   Specimen Description   Final    URINE, CLEAN CATCH Performed at [facility], [place] [address] [place], [place] [address]    Special Requests   Final    NONE Performed at [facility], [address] [place], [place] [address]    Culture (Tailer Volkert)  Final    >=100,000 COLONIES/mL KLEBSIELLA PNEUMONIAE Confirmed Extended Spectrum Beta-Lactamase Producer (ESBL).  In bloodstream infections from ESBL organisms, carbapenems are preferred over piperacillin/tazobactam. They are shown to have Bera Pinela lower risk of mortality.    Report Status 04/06/2022 FINAL  Final   Organism ID, Bacteria KLEBSIELLA PNEUMONIAE (Jabe Jeanbaptiste)  Final      Susceptibility   Klebsiella pneumoniae - MIC*    AMPICILLIN >=32 RESISTANT Resistant     CEFAZOLIN >=64 RESISTANT Resistant     CEFEPIME >=32 RESISTANT Resistant     CEFTRIAXONE >=64 RESISTANT Resistant     CIPROFLOXACIN >=4 RESISTANT Resistant     GENTAMICIN <=1 SENSITIVE Sensitive     IMIPENEM <=0.25 SENSITIVE Sensitive     NITROFURANTOIN 64 INTERMEDIATE Intermediate     TRIMETH/SULFA >=320 RESISTANT Resistant     AMPICILLIN/SULBACTAM 16 INTERMEDIATE Intermediate     PIP/TAZO 8 SENSITIVE Sensitive     * >=100,000 COLONIES/mL KLEBSIELLA PNEUMONIAE  Culture, blood (Routine X 2) w Reflex  to ID Panel     Status: None (Preliminary result)   Collection Time: 04/05/22  3:22 AM   Specimen: BLOOD  Result Value Ref Range Status   Specimen Description   Final    BLOOD BLOOD RIGHT ARM Performed at [place] [address], [place], [place] [address]    Special Requests   Final    BOTTLES DRAWN AEROBIC ONLY Blood Culture adequate volume Performed at [place] [address], [place], [place] [address]    Culture   Final    NO GROWTH 4 DAYS Performed at [facility], [place] [address], [place], [place] [address]    Report Status PENDING  Incomplete  Culture, blood (Routine X 2) w Reflex to ID Panel     Status: None (Preliminary result)   Collection Time: 04/05/22  3:22 AM   Specimen: BLOOD  Result Value Ref Range Status   Specimen Description   Final    BLOOD BLOOD RIGHT HAND Performed at [place] [address] [place], [place] [address]    Special Requests   Final    BOTTLES DRAWN AEROBIC ONLY Blood Culture adequate volume Performed at [place] [address] [place], [place] [address]    Culture   Final    NO GROWTH 4 DAYS Performed at [facility], [place] [address], [place], [place] [address]    Report Status PENDING  Incomplete     Labs: Basic Metabolic Panel: Recent Labs  Lab 04/05/22 0322 04/06/22 0542 04/07/22 0532 04/08/22 0455 04/09/22 0437  NA 138 137 136 137 136  K 3.6 3.6 3.7 3.6 3.7  CL 108 105 104 103 100  CO2 24 22 20* 23 24  GLUCOSE 113* 77 97 101* 95  BUN 37* 40* 41* 45* 46*  CREATININE 3.92* 4.37* 4.49* 4.96* 5.12*  CALCIUM 8.7* 8.5* 8.8* 8.6* 8.3*  MG  --  1.9 1.9 2.2 2.3  PHOS  --  4.3 5.0* 5.3* 6.2*   Liver Function Tests: Recent Labs  Lab 04/04/22 1434 04/06/22 0542 04/08/22 0455  AST 14* 13* 16  ALT 8 8 10   ALKPHOS 60 48 53  BILITOT 0.7 0.5 0.5  PROT 8.3* 7.1 7.2  ALBUMIN 3.9 3.3* 3.3*   Recent Labs  Lab  04/04/22 1434  LIPASE 32   No results for input(s): "AMMONIA" in the  last 168 hours. CBC: Recent Labs  Lab 04/04/22 1434 04/05/22 0322 04/06/22 0542 04/07/22 0532 04/08/22 0455 04/09/22 0437  WBC 6.6 6.5 4.7 4.9 5.1 5.1  NEUTROABS 4.2 4.0 2.3  --  2.5  --   HGB 11.8* 11.8* 10.4* 10.8* 10.8* 11.0*  HCT 36.2* 36.6* 32.4* 34.1* 33.1* 33.0*  MCV 91.0 90.6 91.5 91.9 89.5 89.4  PLT 516* 484* 421* 386 376 346   Cardiac Enzymes: No results for input(s): "CKTOTAL", "CKMB", "CKMBINDEX", "TROPONINI" in the last 168 hours. BNP: BNP (last 3 results) Recent Labs    01/24/22 1527 03/21/22 1745 04/04/22 1434  BNP 1,653.7* 1,113.1* 503.7*    ProBNP (last 3 results) No results for input(s): "PROBNP" in the last 8760 hours.  CBG: Recent Labs  Lab 04/08/22 0754 04/08/22 1229 04/08/22 1616 04/08/22 2155 04/09/22 0742  GLUCAP 122* 121* 116* 211* 100*       Signed:  Fayrene Helper MD.  [facility] 04/09/2022, 9:07 AM

## 2022-04-10 LAB — CULTURE, BLOOD (ROUTINE X 2)
Culture: NO GROWTH
Culture: NO GROWTH
Special Requests: ADEQUATE
Special Requests: ADEQUATE

## 2022-04-23 ENCOUNTER — Ambulatory Visit (HOSPITAL_COMMUNITY)
Admission: RE | Admit: 2022-04-23 | Discharge: 2022-04-23 | Disposition: A | Discharge status: Home / Self Care | Payer: Medicaid Other | Source: Ambulatory Visit | Attending: Vascular Surgery | Admitting: Vascular Surgery

## 2022-04-23 ENCOUNTER — Ambulatory Visit (INDEPENDENT_AMBULATORY_CARE_PROVIDER_SITE_OTHER)
Admission: RE | Admit: 2022-04-23 | Discharge: 2022-04-23 | Disposition: A | Discharge status: Home / Self Care | Payer: Medicaid Other | Source: Ambulatory Visit | Attending: Vascular Surgery | Admitting: Vascular Surgery

## 2022-04-23 DIAGNOSIS — N184 Chronic kidney disease, stage 4 (severe): Secondary | ICD-10-CM

## 2022-04-24 ENCOUNTER — Ambulatory Visit: Payer: Medicaid Other | Admitting: Vascular Surgery

## 2022-04-24 ENCOUNTER — Encounter: Payer: Self-pay | Admitting: Vascular Surgery

## 2022-04-24 VITALS — BP 174/106 | HR 77 | Temp 97.9°F | Resp 20 | Ht 74.0 in | Wt 208.4 lb

## 2022-04-24 DIAGNOSIS — N185 Chronic kidney disease, stage 5: Secondary | ICD-10-CM

## 2022-04-24 NOTE — Progress Notes (Signed)
Patient name: CARVEL RAYNARD MRN: [id number] DOB: 1960/05/26 Sex: male  REASON FOR CONSULT: Permanent hemodialysis access placement  HPI: MUHAMMAD SAUNDERS is a 62 y.o. male, with history of diabetes, hypertension, stage V CKD that presents for evaluation of permanent hemodialysis access.  The referral from nephrology states place AV fistula now and wait on AV graft.  In reviewing the notes from [facility] appears that his diabetes and hypertension are largely contributing to his chronic kidney disease.  Patient states he is right-handed.  No previous access in the past.  No chest wall implants.  Did have a catheter briefly years ago.  Past Medical History:  Diagnosis Date   Anemia    Anginal pain    Anxiety    Arthritis    BPH with obstruction/lower urinary tract symptoms    CAD (coronary artery disease)    cardiologist--- dr Lucienne Minks badal;  11-06-2019 cardiac cath in [place] (result in care everywhere)  nonobstructive cad involing pRCA 60% (done in setting worseing CHF/ acute pulmonary edema requiring intubation/ AKI   Chronic combined systolic and diastolic CHF (congestive heart failure)    Chronic kidney disease, stage IV (severe)    nephrologist--- dr Ronalee Belts   Depression    Diabetic neuropathy    Dyspnea    Edema of both lower extremities    GERD (gastroesophageal reflux disease)    Heart murmur    History of community acquired pneumonia    ad[facility]-20[facility]  w/ ARF hypoxia w/ severe sepsis   Hyperlipidemia    Hypertension    Insulin dependent type 2 diabetes mellitus    Pneumonia    Retinopathy due to secondary diabetes    Uses walker    Vitamin B12 deficiency    Vitreous hemorrhage     Past Surgical History:  Procedure Laterality Date   CARDIAC CATHETERIZATION  11/06/2019   [facility] in [place];    nonobstructive CAD , pRCA 60% (result in care everywhere)   CATARACT EXTRACTION W/ INTRAOCULAR LENS IMPLANT Bilateral 2017    TRANSURETHRAL RESECTION OF PROSTATE N/A 10/16/2021   Procedure: TRANSURETHRAL RESECTION OF THE PROSTATE (TURP);  Surgeon: Jannifer Hick, MD;  Location: [facility];  Service: Urology;  Laterality: N/A;    Family History  Problem Relation Age of Onset   Renal cancer Mother    Hypertension Mother    Pancreatic cancer Mother    Hypertension Sister    Stroke Sister    Leukemia Maternal Uncle    Sickle cell trait Maternal Aunt    Colon cancer Neg Hx    Esophageal cancer Neg Hx    Rectal cancer Neg Hx    Stomach cancer Neg Hx     SOCIAL HISTORY: Social History   Socioeconomic History   Marital status: Divorced    Spouse name: Not on file   Number of children: 1   Years of education: 12   Highest education level: Not on file  Occupational History   Occupation: unemployed    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, Archivist  Tobacco Use   Smoking status: Never    Passive exposure: Never   Smokeless tobacco: Never  Vaping Use   Vaping Use: Never used  Substance and Sexual Activity   Alcohol use: No    Alcohol/week: 0.0 standard drinks of alcohol   Drug use: No   Sexual activity: Yes  Other Topics Concern   Not on file  Social History Narrative   Not on  file   Social Determinants of Health   Financial Resource Strain: Not on file  Food Insecurity: Patient Declined (04/05/2022)   Hunger Vital Sign    Worried About Running Out of Food in the Last Year: Patient declined    Ran Out of Food in the Last Year: Patient declined  Transportation Needs: Patient Declined (04/05/2022)   PRAPARE - Administrator, Civil Service (Medical): Patient declined    Lack of Transportation (Non-Medical): Patient declined  Physical Activity: Not on file  Stress: Not on file  Social Connections: Not on file  Intimate Partner Violence: Patient Declined (04/05/2022)   Humiliation, Afraid, Rape, and Kick questionnaire    Fear of Current or Ex-Partner: Patient declined    Emotionally Abused: Patient declined     Physically Abused: Patient declined    Sexually Abused: Patient declined    Allergies  Allergen Reactions   Claritin [Loratadine] Swelling and Other (See Comments)    Joint swelling    Hydrochlorothiazide Other (See Comments)    Dizziness   Latex Hives and Swelling   Lyrica [Pregabalin] Other (See Comments)    Depression. "Makes me loopy"    Metformin And Related Diarrhea and Nausea And Vomiting    Current Outpatient Medications  Medication Sig Dispense Refill   amLODipine (NORVASC) 10 MG tablet Take 1 tablet (10 mg total) by mouth daily. 30 tablet 6   calcitRIOL (ROCALTROL) 0.25 MCG capsule Take 1 capsule (0.25 mcg total) by mouth daily. Follow up with your kidney doctors for refills 30 capsule 0   Cholecalciferol (VITAMIN D3) 1.25 MG (50000 UT) CAPS Take 50,000 Units by mouth every Friday.     clopidogrel (PLAVIX) 75 MG tablet Take 1 tablet (75 mg total) by mouth daily. Follow up with your PCP for additional refills 30 tablet 0   famotidine (PEPCID) 10 MG tablet Take 1 tablet (10 mg total) by mouth daily. 30 tablet 0   finasteride (PROSCAR) 5 MG tablet Take 1 tablet (5 mg total) by mouth daily. 30 tablet 2   furosemide (LASIX) 80 MG tablet Take 1 tablet (80 mg total) by mouth 2 (two) times daily. 60 tablet 2   LANTUS SOLOSTAR 100 UNIT/ML Solostar Pen Inject 5 Units into the skin at bedtime.     lidocaine 4 % Place 1 patch onto the skin daily as needed for up to 28 days (pain). 7 patch 3   meclizine (ANTIVERT) 25 MG tablet Take 25 mg by mouth daily as needed for dizziness.     metoprolol succinate (TOPROL-XL) 25 MG 24 hr tablet Take 0.5 tablets (12.5 mg total) by mouth daily. 15 tablet 2   oxyCODONE (OXY IR/ROXICODONE) 5 MG immediate release tablet Take 5 mg by mouth every 8 (eight) hours as needed (for pain).     polyethylene glycol (MIRALAX / GLYCOLAX) 17 g packet Take 17 g by mouth daily. 14 each 0   rosuvastatin (CRESTOR) 10 MG tablet Take 10 mg by mouth daily.      senna-docusate (SENOKOT-S) 8.6-50 MG tablet Take 1 tablet by mouth at bedtime. (Patient taking differently: Take 1 tablet by mouth at bedtime as needed for mild constipation.) 30 tablet 2   sodium bicarbonate 650 MG tablet Take 1,300 mg by mouth 2 (two) times daily.     tamsulosin (FLOMAX) 0.4 MG CAPS capsule Take 0.4 mg by mouth at bedtime.     triamcinolone ointment (KENALOG) 0.5 % Apply 1 Application topically 2 (two) times daily as needed (for  itching).     TYLENOL 500 MG tablet Take 500-1,000 mg by mouth every 6 (six) hours as needed for mild pain or headache.     metoCLOPramide (REGLAN) 5 MG tablet Take 1 tablet (5 mg total) by mouth 4 (four) times daily -  before meals and at bedtime for 14 days. After 2 weeks, follow up with your PCP for further recommendations on how to take.  If your nausea improves prior to completing the 2 week course, you can use before meals and at bedtime only as needed. 56 tablet 0   No current facility-administered medications for this visit.    REVIEW OF SYSTEMS:  [X]  denotes positive finding, [ ]  denotes negative finding Cardiac  Comments:  Chest pain or chest pressure:    Shortness of breath upon exertion:    Short of breath when lying flat:    Irregular heart rhythm:        Vascular    Pain in calf, thigh, or hip brought on by ambulation:    Pain in feet at night that wakes you up from your sleep:     Blood clot in your veins:    Leg swelling:         Pulmonary    Oxygen at home:    Productive cough:     Wheezing:         Neurologic    Sudden weakness in arms or legs:     Sudden numbness in arms or legs:     Sudden onset of difficulty speaking or slurred speech:    Temporary loss of vision in one eye:     Problems with dizziness:         Gastrointestinal    Blood in stool:     Vomited blood:         Genitourinary    Burning when urinating:     Blood in urine:        Psychiatric    Major depression:         Hematologic    Bleeding  problems:    Problems with blood clotting too easily:        Skin    Rashes or ulcers:        Constitutional    Fever or chills:      PHYSICAL EXAM: Vitals:   04/24/22 0938  BP: (!) 174/106  Pulse: 77  Resp: 20  Temp: 97.9 F (36.6 C)  TempSrc: Temporal  SpO2: 98%  Weight: 208 lb 6.4 oz (94.5 kg)  Height: 6\' 2"  (1.88 m)    GENERAL: The patient is a well-nourished male, in no acute distress. The vital signs are documented above. CARDIAC: There is a regular rate and rhythm.  VASCULAR:  Palpable radial and brachial pulses both upper extremities PULMONARY: No respiratory distress. ABDOMEN: Soft and non-tender. MUSCULOSKELETAL: There are no major deformities or cyanosis. NEUROLOGIC: No focal weakness or paresthesias are detected. SKIN: There are no ulcers or rashes noted. PSYCHIATRIC: The patient has a normal affect.  DATA:   UPPER EXTREMITY VEIN MAPPING  Patient Name:  Dwan BoltCHARLES R Cara  Date of Exam:   04/23/2022 Medical Rec #: [id number]          Accession #:    [id number] Date of Birth: 1960-08-14          Patient Gender: M Patient Age:   [age] years [facility]am [facility] Procedure:      VAS US UPPER EXT  VEIN MAPPING (PRE-OP AVF) Referring Phys: Kashius Dominic DICKSON   --------------------------------------------------------------------------- -----   Indications: Pre-access.  Comparison Study: No prior study  Performing Technologist: Gertie Fey MHA, RDMS, RVT, RDCS    Examination Guidelines: A complete evaluation includes B-mode imaging, spectral Doppler, color Doppler, and power Doppler as needed of all accessible portions of each vessel. Bilateral testing is considered an integral part of a complete examination. Limited examinations for reoccurring indications may be performed as noted.  +-----------------+-------------+----------+--------------+ Right Cephalic   Diameter (cm)Depth (cm)   Findings     +-----------------+-------------+----------+--------------+ Shoulder             0.26                              +-----------------+-------------+----------+--------------+ Prox upper arm       0.26                              +-----------------+-------------+----------+--------------+ Mid upper arm        0.36                              +-----------------+-------------+----------+--------------+ Dist upper arm       0.35                              +-----------------+-------------+----------+--------------+ Antecubital fossa    0.21                 branching    +-----------------+-------------+----------+--------------+ Prox forearm         0.09                              +-----------------+-------------+----------+--------------+ Mid forearm          0.09                              +-----------------+-------------+----------+--------------+ Dist forearm                            not visualized +-----------------+-------------+----------+--------------+  +-----------------+-------------+----------+--------------+ Right Basilic    Diameter (cm)Depth (cm)   Findings    +-----------------+-------------+----------+--------------+ Mid upper arm        0.37                              +-----------------+-------------+----------+--------------+ Dist upper arm       0.32                              +-----------------+-------------+----------+--------------+ Antecubital fossa    0.38                              +-----------------+-------------+----------+--------------+ Prox forearm         0.21                              +-----------------+-------------+----------+--------------+ Mid forearm  not visualized +-----------------+-------------+----------+--------------+  +-----------------+-------------+----------+---------+ Left Cephalic    Diameter (cm)Depth (cm)Findings   +-----------------+-------------+----------+---------+ Shoulder             0.19                         +-----------------+-------------+----------+---------+ Prox upper arm       0.19                         +-----------------+-------------+----------+---------+ Mid upper arm        0.28                         +-----------------+-------------+----------+---------+ Dist upper arm       0.26                         +-----------------+-------------+----------+---------+ Antecubital fossa    0.25                         +-----------------+-------------+----------+---------+ Prox forearm         0.17                         +-----------------+-------------+----------+---------+ Mid forearm          0.14               branching +-----------------+-------------+----------+---------+ Dist forearm         0.13                         +-----------------+-------------+----------+---------+  +-----------------+-------------+----------+--------+ Left Basilic     Diameter (cm)Depth (cm)Findings +-----------------+-------------+----------+--------+ Mid upper arm        0.29                        +-----------------+-------------+----------+--------+ Dist upper arm       0.37                        +-----------------+-------------+----------+--------+ Antecubital fossa    0.22                        +-----------------+-------------+----------+--------+ Prox forearm         0.18                        +-----------------+-------------+----------+--------+  *See table(s) above for measurements and observations.     Diagnosing physician: Coral Else MD Electronically signed by Coral Else MD on 04/23/2022 at 4:24:30 PM.      Assessment/Plan:  62 y.o. male, with history of diabetes, hypertension, stage V CKD that presents for evaluation of permanent hemodialysis access.  The referral from nephrology states place AV fistula  now and wait on AV graft.  I discussed typical placement of AV access in the nondominant arm.  Unfortunately his left arm veins look small based on his vein mapping today.  The right arm veins look much better.  I have recommended right arm AV fistula.  He is amendable to proceed.  I discussed basic steps of the procedure as well as risks including small risk of bleeding, infection, failure to mature and steal syndrome.  Will get [facility] at [facility] at his convenience.  I discussed taking 3 months to mature once access  placement.   Cephus Shelling, MD [facility] Office: [phone]

## 2022-04-25 ENCOUNTER — Telehealth: Payer: Self-pay

## 2022-04-25 NOTE — Telephone Encounter (Signed)
Attempted to reach pt to schedule surgery, but no answer. LVM for patient to return call.

## 2022-04-26 NOTE — Telephone Encounter (Signed)
Left vm for patient to return call 

## 2022-04-27 ENCOUNTER — Other Ambulatory Visit: Payer: Self-pay

## 2022-04-27 DIAGNOSIS — N185 Chronic kidney disease, stage 5: Secondary | ICD-10-CM

## 2022-05-02 ENCOUNTER — Telehealth: Payer: Self-pay

## 2022-05-02 NOTE — Telephone Encounter (Signed)
Received a call from pt requesting to cancel surgery on 4/19 and stated he will contact office back when ready to schedule.

## 2022-05-04 ENCOUNTER — Emergency Department (HOSPITAL_COMMUNITY): Payer: Medicaid Other

## 2022-05-04 ENCOUNTER — Emergency Department (HOSPITAL_COMMUNITY)
Admission: EM | Admit: 2022-05-04 | Discharge: 2022-05-04 | Disposition: A | Discharge status: Home / Self Care | Payer: Medicaid Other | Attending: Emergency Medicine | Admitting: Emergency Medicine

## 2022-05-04 ENCOUNTER — Encounter (HOSPITAL_COMMUNITY): Payer: Self-pay

## 2022-05-04 ENCOUNTER — Ambulatory Visit: Admit: 2022-05-04 | Payer: Medicaid Other | Admitting: Vascular Surgery

## 2022-05-04 DIAGNOSIS — R0602 Shortness of breath: Secondary | ICD-10-CM | POA: Insufficient documentation

## 2022-05-04 DIAGNOSIS — Z794 Long term (current) use of insulin: Secondary | ICD-10-CM | POA: Diagnosis not present

## 2022-05-04 DIAGNOSIS — I130 Hypertensive heart and chronic kidney disease with heart failure and stage 1 through stage 4 chronic kidney disease, or unspecified chronic kidney disease: Secondary | ICD-10-CM | POA: Insufficient documentation

## 2022-05-04 DIAGNOSIS — R112 Nausea with vomiting, unspecified: Secondary | ICD-10-CM | POA: Diagnosis not present

## 2022-05-04 DIAGNOSIS — Z1152 Encounter for screening for COVID-19: Secondary | ICD-10-CM | POA: Diagnosis not present

## 2022-05-04 DIAGNOSIS — N390 Urinary tract infection, site not specified: Secondary | ICD-10-CM | POA: Diagnosis not present

## 2022-05-04 DIAGNOSIS — Z91040 Latex allergy status: Secondary | ICD-10-CM | POA: Diagnosis not present

## 2022-05-04 DIAGNOSIS — Z7902 Long term (current) use of antithrombotics/antiplatelets: Secondary | ICD-10-CM | POA: Insufficient documentation

## 2022-05-04 DIAGNOSIS — R079 Chest pain, unspecified: Secondary | ICD-10-CM | POA: Insufficient documentation

## 2022-05-04 DIAGNOSIS — I2510 Atherosclerotic heart disease of native coronary artery without angina pectoris: Secondary | ICD-10-CM | POA: Insufficient documentation

## 2022-05-04 DIAGNOSIS — Z79899 Other long term (current) drug therapy: Secondary | ICD-10-CM | POA: Diagnosis not present

## 2022-05-04 DIAGNOSIS — E1122 Type 2 diabetes mellitus with diabetic chronic kidney disease: Secondary | ICD-10-CM | POA: Diagnosis not present

## 2022-05-04 DIAGNOSIS — I5042 Chronic combined systolic (congestive) and diastolic (congestive) heart failure: Secondary | ICD-10-CM | POA: Diagnosis not present

## 2022-05-04 DIAGNOSIS — N184 Chronic kidney disease, stage 4 (severe): Secondary | ICD-10-CM | POA: Diagnosis not present

## 2022-05-04 LAB — COMPREHENSIVE METABOLIC PANEL
ALT: 9 U/L (ref 0–44)
AST: 15 U/L (ref 15–41)
Albumin: 3.6 g/dL (ref 3.5–5.0)
Alkaline Phosphatase: 63 U/L (ref 38–126)
Anion gap: 12 (ref 5–15)
BUN: 52 mg/dL — ABNORMAL HIGH (ref 8–23)
CO2: 25 mmol/L (ref 22–32)
Calcium: 9.2 mg/dL (ref 8.9–10.3)
Chloride: 99 mmol/L (ref 98–111)
Creatinine, Ser: 4.97 mg/dL — ABNORMAL HIGH (ref 0.61–1.24)
GFR, Estimated: 13 mL/min — ABNORMAL LOW (ref >60)
Glucose, Bld: 177 mg/dL — ABNORMAL HIGH (ref 70–99)
Potassium: 3.5 mmol/L (ref 3.5–5.1)
Sodium: 136 mmol/L (ref 135–145)
Total Bilirubin: 0.5 mg/dL (ref 0.3–1.2)
Total Protein: 7.6 g/dL (ref 6.5–8.1)

## 2022-05-04 LAB — CBC WITH DIFFERENTIAL/PLATELET
Abs Immature Granulocytes: 0.01 10*3/uL (ref 0.00–0.07)
Basophils Absolute: 0.1 10*3/uL (ref 0.0–0.1)
Basophils Relative: 1 %
Eosinophils Absolute: 0.1 10*3/uL (ref 0.0–0.5)
Eosinophils Relative: 2 %
HCT: 37.4 % — ABNORMAL LOW (ref 39.0–52.0)
Hemoglobin: 12.8 g/dL — ABNORMAL LOW (ref 13.0–17.0)
Immature Granulocytes: 0 %
Lymphocytes Relative: 30 %
Lymphs Abs: 1.6 10*3/uL (ref 0.7–4.0)
MCH: 29.6 pg (ref 26.0–34.0)
MCHC: 34.2 g/dL (ref 30.0–36.0)
MCV: 86.6 fL (ref 80.0–100.0)
Monocytes Absolute: 0.5 10*3/uL (ref 0.1–1.0)
Monocytes Relative: 10 %
Neutro Abs: 3 10*3/uL (ref 1.7–7.7)
Neutrophils Relative %: 57 %
Platelets: 306 10*3/uL (ref 150–400)
RBC: 4.32 MIL/uL (ref 4.22–5.81)
RDW: 13.7 % (ref 11.5–15.5)
WBC: 5.2 10*3/uL (ref 4.0–10.5)
nRBC: 0 % (ref 0.0–0.2)

## 2022-05-04 LAB — URINALYSIS, ROUTINE W REFLEX MICROSCOPIC
Bilirubin Urine: NEGATIVE
Glucose, UA: NEGATIVE mg/dL
Ketones, ur: NEGATIVE mg/dL
Nitrite: NEGATIVE
Protein, ur: >=300 mg/dL — AB
Specific Gravity, Urine: 1.011 (ref 1.005–1.030)
WBC, UA: >50 WBC/hpf (ref 0–5)
pH: 5 (ref 5.0–8.0)

## 2022-05-04 LAB — MAGNESIUM: Magnesium: 1.9 mg/dL (ref 1.7–2.4)

## 2022-05-04 LAB — RESP PANEL BY RT-PCR (RSV, FLU A&B, COVID)  RVPGX2
Influenza A by PCR: NEGATIVE
Influenza B by PCR: NEGATIVE
Resp Syncytial Virus by PCR: NEGATIVE
SARS Coronavirus 2 by RT PCR: NEGATIVE

## 2022-05-04 LAB — BRAIN NATRIURETIC PEPTIDE: B Natriuretic Peptide: 393.4 pg/mL — ABNORMAL HIGH (ref 0.0–100.0)

## 2022-05-04 LAB — TROPONIN I (HIGH SENSITIVITY)
Troponin I (High Sensitivity): 53 ng/L — ABNORMAL HIGH (ref <18)
Troponin I (High Sensitivity): 61 ng/L — ABNORMAL HIGH (ref <18)

## 2022-05-04 SURGERY — ARTERIOVENOUS (AV) FISTULA CREATION
Anesthesia: Choice | Laterality: Right

## 2022-05-04 MED ORDER — CEPHALEXIN 500 MG PO CAPS: 500.0000 mg | ORAL_CAPSULE | Freq: Three times a day (TID) | ORAL | 0 refills | Status: AC

## 2022-05-04 MED ORDER — AMLODIPINE BESYLATE 5 MG PO TABS
10.0000 mg | ORAL_TABLET | Freq: Once | ORAL | Status: AC
  Administered 2022-05-04: 10 mg via ORAL
  Filled 2022-05-04: qty 2

## 2022-05-04 MED ORDER — FUROSEMIDE 10 MG/ML IJ SOLN
60.0000 mg | Freq: Once | INTRAMUSCULAR | Status: AC
  Administered 2022-05-04: 60 mg via INTRAVENOUS
  Filled 2022-05-04: qty 6

## 2022-05-04 NOTE — ED Provider Notes (Signed)
[facility] AT [facility] Provider Note   CSN: [id number] Arrival date & time: 05/04/22  2376     History  Chief Complaint  Patient presents with   Shortness of Breath    Ryan Jenkins is a 62 y.o. male.  62 year old male with past medical history significant for CHF presents today for evaluation of chest pain, shortness of breath, nausea and vomiting.  States shortness of breath has been progressively worsening for the past week, chest pain, nausea vomiting developed about 2 days ago.  Currently is chest pain-free.  States he last had chest pain around 6 AM this morning prior to EMS arrival.  He did receive 324 mg of aspirin.  He also states he has had series of UTIs.  He states he did take antibiotics with feels this has not resolved.  Denies fever.  Denies peripheral edema.  States if he bends over to put on his shoes he does get winded.  Reports compliance with his home antihypertensives as well as Lasix.  The history is provided by the patient. No language interpreter was used.       Home Medications Prior to Admission medications   Medication Sig Start Date End Date Taking? Authorizing Provider  amLODipine (NORVASC) 10 MG tablet Take 1 tablet (10 mg total) by mouth daily. 02/08/21   Danford, Earl Lites, MD  calcitRIOL (ROCALTROL) 0.25 MCG capsule Take 1 capsule (0.25 mcg total) by mouth daily. Follow up with your kidney doctors for refills 04/09/22 05/09/22  Zigmund Daniel., MD  Cholecalciferol (VITAMIN D3) 1.25 MG (50000 UT) CAPS Take 50,000 Units by mouth every Friday. 09/12/21   [provider]  clopidogrel (PLAVIX) 75 MG tablet Take 1 tablet (75 mg total) by mouth daily. Follow up with your PCP for additional refills 04/09/22 05/09/22  Zigmund Daniel., MD  famotidine (PEPCID) 10 MG tablet Take 1 tablet (10 mg total) by mouth daily. 04/09/22 05/09/22  Zigmund Daniel., MD  finasteride (PROSCAR) 5 MG tablet Take 1  tablet (5 mg total) by mouth daily. 07/20/20   Hoy Register, MD  furosemide (LASIX) 80 MG tablet Take 1 tablet (80 mg total) by mouth 2 (two) times daily. 03/29/22 06/27/22  Dahal, Melina Schools, MD  LANTUS SOLOSTAR 100 UNIT/ML Solostar Pen Inject 5 Units into the skin at bedtime. 03/29/22   Lorin Glass, MD  meclizine (ANTIVERT) 25 MG tablet Take 25 mg by mouth daily as needed for dizziness. 08/08/21   [provider]  metoCLOPramide (REGLAN) 5 MG tablet Take 1 tablet (5 mg total) by mouth 4 (four) times daily -  before meals and at bedtime for 14 days. After 2 weeks, follow up with your PCP for further recommendations on how to take.  If your nausea improves prior to completing the 2 week course, you can use before meals and at bedtime only as needed. 04/09/22 04/23/22  Zigmund Daniel., MD  metoprolol succinate (TOPROL-XL) 25 MG 24 hr tablet Take 0.5 tablets (12.5 mg total) by mouth daily. 03/30/22 06/28/22  Lorin Glass, MD  oxyCODONE (OXY IR/ROXICODONE) 5 MG immediate release tablet Take 5 mg by mouth every 8 (eight) hours as needed (for pain).    [provider]  polyethylene glycol (MIRALAX / GLYCOLAX) 17 g packet Take 17 g by mouth daily. 03/30/22   Lorin Glass, MD  rosuvastatin (CRESTOR) 10 MG tablet Take 10 mg by mouth daily.    [provider]  senna-docusate (  SENOKOT-S) 8.6-50 MG tablet Take 1 tablet by mouth at bedtime. Patient taking differently: Take 1 tablet by mouth at bedtime as needed for mild constipation. 03/29/22 06/27/22  Lorin Glass, MD  sodium bicarbonate 650 MG tablet Take 1,300 mg by mouth 2 (two) times daily. 09/20/21   [provider]  tamsulosin (FLOMAX) 0.4 MG CAPS capsule Take 0.4 mg by mouth at bedtime. 05/25/21   [provider]  triamcinolone ointment (KENALOG) 0.5 % Apply 1 Application topically 2 (two) times daily as needed (for itching). 03/27/22   [provider]  TYLENOL 500 MG tablet Take 500-1,000 mg by mouth every 6  (six) hours as needed for mild pain or headache.    [provider]      Allergies    Claritin [loratadine], Hydrochlorothiazide, Latex, Lyrica [pregabalin], and Metformin and related    Review of Systems   Review of Systems  Constitutional:  Negative for chills and fever.  Respiratory:  Positive for shortness of breath.   Cardiovascular:  Positive for chest pain. Negative for palpitations and leg swelling.  Gastrointestinal:  Positive for nausea and vomiting. Negative for abdominal pain.  Neurological:  Negative for light-headedness.  All other systems reviewed and are negative.   Physical Exam Updated Vital Signs BP (!) 239/137 (BP Location: Right Arm)   Pulse 92   Temp 98.3 F (36.8 C) (Oral)   Resp 12   SpO2 100%  Physical Exam Vitals and nursing note reviewed.  Constitutional:      General: He is not in acute distress.    Appearance: Normal appearance. He is not ill-appearing.  HENT:     Head: Normocephalic and atraumatic.     Nose: Nose normal.  Eyes:     General: No scleral icterus.    Extraocular Movements: Extraocular movements intact.     Conjunctiva/sclera: Conjunctivae normal.  Cardiovascular:     Rate and Rhythm: Normal rate and regular rhythm.     Pulses: Normal pulses.  Pulmonary:     Effort: Pulmonary effort is normal. No respiratory distress.     Breath sounds: Normal breath sounds. No wheezing or rales.  Abdominal:     General: There is no distension.     Palpations: Abdomen is soft.     Tenderness: There is no abdominal tenderness. There is no guarding.  Musculoskeletal:        General: Normal range of motion.     Cervical back: Normal range of motion.     Right lower leg: No edema.     Left lower leg: No edema.  Skin:    General: Skin is warm and dry.  Neurological:     General: No focal deficit present.     Mental Status: He is alert. Mental status is at baseline.     ED Results / Procedures / Treatments   Labs (all labs  ordered are listed, but only abnormal results are displayed) Labs Reviewed  RESP PANEL BY RT-PCR (RSV, FLU A&B, COVID)  RVPGX2  CBC WITH DIFFERENTIAL/PLATELET  COMPREHENSIVE METABOLIC PANEL  MAGNESIUM  BRAIN NATRIURETIC PEPTIDE  URINALYSIS, ROUTINE W REFLEX MICROSCOPIC  TROPONIN I (HIGH SENSITIVITY)    EKG None  Radiology No results found.  Procedures Procedures    Medications Ordered in ED Medications  amLODipine (NORVASC) tablet 10 mg (has no administration in time range)    ED Course/ Medical Decision Making/ A&P  Medical Decision Making Amount and/or Complexity of Data Reviewed Labs: ordered. Radiology: ordered.  Risk Prescription drug management.   Medical Decision Making / ED Course   This patient presents to the ED for concern of shortness of breath, this involves an extensive number of treatment options, and is a complaint that carries with it a high risk of complications and morbidity.  The differential diagnosis includes ACS, pneumonia, CHF exacerbation, viral URI  MDM: 62 year old male with past medical history as above presents today for evaluation of shortness of breath that has been progressively worsening, chest pain, as well as nausea and vomiting.  Symptoms worse over the past 2 days.  States he reached out to his primary care provider who told him if his symptoms get worse to come into the emergency department.  He is overall well-appearing.  He is hypertensive otherwise hemodynamically stable.  Does not appear to be in acute distress.  No signs of volume overload on exam.  JVD is just above the clavicle.  Will evaluate with labs, chest x-ray.  Will obtain UA as well at patient's request.  Workup overall reassuring.  CBC without leukocytosis or significant anemia.  CMP shows creatinine of 4.97 which is around his baseline with CKD.  No other acute findings.  Respiratory panel negative.  Troponin elevated however flat at 5761  on repeat.  No suspicion for ACS as he is without chest pain.  19.9.  BNP elevated at 393.  Slightly lower than what it was last month.  No significant signs of volume overload on exam, or x-ray.  Will give dose of IV Lasix at 60 mg, he can resume his home dose of 80 mg p.o. tomorrow.  Discussed close follow-up with cardiology.  Patient voices understanding and is in agreement with plan.  UA did show some signs of UTI.  Given he has dysuria we will treat and send for urine culture.  Lab Tests: -I ordered, reviewed, and interpreted labs.   The pertinent results include:   Labs Reviewed  RESP PANEL BY RT-PCR (RSV, FLU A&B, COVID)  RVPGX2  CBC WITH DIFFERENTIAL/PLATELET  COMPREHENSIVE METABOLIC PANEL  MAGNESIUM  BRAIN NATRIURETIC PEPTIDE  URINALYSIS, ROUTINE W REFLEX MICROSCOPIC  TROPONIN I (HIGH SENSITIVITY)      EKG  EKG Interpretation  Date/Time:    Ventricular Rate:    PR Interval:    QRS Duration:   QT Interval:    QTC Calculation:   R Axis:     Text Interpretation:           Imaging Studies ordered: I ordered imaging studies including chest x-ray I independently visualized and interpreted imaging. I agree with the radiologist interpretation   Medicines ordered and prescription drug management: Meds ordered this encounter  Medications   amLODipine (NORVASC) tablet 10 mg    -I have reviewed the patients home medicines and have made adjustments as needed  Reevaluation: After the interventions noted above, I reevaluated the patient and found that they have :improved  Co morbidities that complicate the patient evaluation  Past Medical History:  Diagnosis Date   Anemia    Anginal pain    Anxiety    Arthritis    BPH with obstruction/lower urinary tract symptoms    CAD (coronary artery disease)    cardiologist--- dr Lucienne Minks badal;  11-06-2019 cardiac cath in [place] (result in care everywhere)  nonobstructive cad involing pRCA 60% (done in setting worseing CHF/  acute pulmonary edema requiring intubation/ AKI   Chronic combined  systolic and diastolic CHF (congestive heart failure)    Chronic kidney disease, stage IV (severe)    nephrologist--- dr Ronalee Belts   Depression    Diabetic neuropathy    Dyspnea    Edema of both lower extremities    GERD (gastroesophageal reflux disease)    Heart murmur    History of community acquired pneumonia    ad[facility] 06-04-20[facility]  w/ ARF hypoxia w/ severe sepsis   Hyperlipidemia    Hypertension    Insulin dependent type 2 diabetes mellitus    Pneumonia    Retinopathy due to secondary diabetes    Uses walker    Vitamin B12 deficiency    Vitreous hemorrhage       Dispostion: Patient discharged in stable condition.  Return precaution discussed.  Patient voiced understanding and is in agreement with plan.  Final Clinical Impression(s) / ED Diagnoses Final diagnoses:  Shortness of breath  Urinary tract infection without hematuria, site unspecified    Rx / DC Orders ED Discharge Orders          Ordered    cephALEXin (KEFLEX) 500 MG capsule  3 times daily        05/04/22 1228              Marita Kansas, PA-C 05/04/22 1231    Linwood Dibbles, MD 05/05/22 [id number]

## 2022-05-04 NOTE — Discharge Instructions (Addendum)
Your workup today was was overall reassuring.  No concerning cause of your shortness of breath, or other symptoms.  Your blood work for CHF was somewhat elevated however lower than it was last month.  No signs of significant fluid on your lungs on the x-ray.  You received a dose of Lasix in the emergency department.  Continue taking your home dose of Lasix starting tomorrow.  Follow-up with your heart doctor.  For any concerning symptoms return to the emergency department.  Your blood pressure is elevated.  You received your home dose of amlodipine in the emergency department.  Keep a close eye on this and follow-up with your primary care doctor regarding this.  If you develop vision change, balance issues chest pain please return to the emergency department.

## 2022-05-04 NOTE — ED Triage Notes (Addendum)
Pt brought from home by ems due to N/V, endorsed vomiting this morning, shortness of breath for the past two days. Pt called EMS per his primary doctor if he started feeling worse to come to ED. PMH of renal failure, candidate for dialysis port but currently does not have one placed. Pt is A/O x 4 w/ even and unlabored breathing. Pt is afebrile. Per EMS had small episode of intermittent chest pain so Aspirin given enroute at 0600 324 mg. No current chest pain reported upon arrival.

## 2022-05-06 LAB — URINE CULTURE

## 2022-05-07 LAB — URINE CULTURE

## 2022-05-08 LAB — URINE CULTURE: Culture: >=100000 — AB

## 2022-05-09 ENCOUNTER — Telehealth (HOSPITAL_BASED_OUTPATIENT_CLINIC_OR_DEPARTMENT_OTHER): Payer: Self-pay

## 2022-05-09 NOTE — Telephone Encounter (Signed)
Post ED Visit - Positive Culture Follow-up: Unsuccessful Patient Follow-up  Culture assessed and recommendations reviewed by:   Lorin Alvester Morin, Pharm.D.  Celedonio Miyamoto, Pharm.D., BCPS AQ-ID  Garvin Fila, Pharm.D., BCPS  Georgina Pillion, [address].D., BCPS  [place], [address].D., BCPS, AAHIVP  Estella Husk, Pharm.D., BCPS, AAHIVP  Sherlynn Carbon, PharmD  Pollyann Samples, PharmD, BCPS  Positive urine culture   Patient discharged without antimicrobial prescription and treatment is now indicated  Organism is resistant to prescribed ED discharge antimicrobial  Patient with positive blood cultures  Plan Stop Cephalexin and start Fosfomycin 3g po x 1 , return to ED if needed. reviewed by ED provider Glyn Ade, MD   Unable to contact patient after 3 attempts, letter will be sent to address on file  Sandria Senter 05/09/2022, 12:30 PM

## 2022-05-09 NOTE — Progress Notes (Signed)
ED Antimicrobial Stewardship Positive Culture Follow Up   Ryan Jenkins is an 62 y.o. male who presented to [facility] on 05/04/2022 with a chief complaint of  Chief Complaint  Patient presents with   Shortness of Breath    Recent Results (from the past 720 hour(s))  Resp panel by RT-PCR (RSV, Flu A&B, Covid) Anterior Nasal Swab     Status: None   Collection Time: 05/04/22  6:52 AM   Specimen: Anterior Nasal Swab  Result Value Ref Range Status   SARS Coronavirus 2 by RT PCR NEGATIVE NEGATIVE Final   Influenza A by PCR NEGATIVE NEGATIVE Final   Influenza B by PCR NEGATIVE NEGATIVE Final    Comment: (NOTE) The Xpert Xpress SARS-CoV-2/FLU/RSV plus assay is intended as an aid in the diagnosis of influenza from Nasopharyngeal swab specimens and should not be used as a sole basis for treatment. Nasal washings and aspirates are unacceptable for Xpert Xpress SARS-CoV-2/FLU/RSV testing.  Fact Sheet for Patients: BloggerCourse.com  Fact Sheet for Healthcare Providers: SeriousBroker.it  This test is not yet approved or cleared by the [place] FDA and has been authorized for detection and/or diagnosis of SARS-CoV-2 by FDA under an Emergency Use Authorization (EUA). This EUA will remain in effect (meaning this test can be used) for the duration of the COVID-19 declaration under Section 564(b)(1) of the Act, 21 U.S.C. section 360bbb-3(b)(1), unless the authorization is terminated or revoked.     Resp Syncytial Virus by PCR NEGATIVE NEGATIVE Final    Comment: (NOTE) Fact Sheet for Patients: BloggerCourse.com  Fact Sheet for Healthcare Providers: SeriousBroker.it  This test is not yet approved or cleared by the [place] FDA and has been authorized for detection and/or diagnosis of SARS-CoV-2 by FDA under an Emergency Use Authorization (EUA). This EUA will remain in effect  (meaning this test can be used) for the duration of the COVID-19 declaration under Section 564(b)(1) of the Act, 21 U.S.C. section 360bbb-3(b)(1), unless the authorization is terminated or revoked.  Performed at [facility], [address], [place], [place] [address]   Urine Culture     Status: Abnormal   Collection Time: 05/04/22 11:04 AM   Specimen: Urine, Clean Catch  Result Value Ref Range Status   Specimen Description URINE, CLEAN CATCH  Final   Special Requests   Final    NONE Performed at [facility], [address] [place], [place] [address]    Culture (A)  Final    >=100,000 COLONIES/mL KLEBSIELLA PNEUMONIAE Confirmed Extended Spectrum Beta-Lactamase Producer (ESBL).  In bloodstream infections from ESBL organisms, carbapenems are preferred over piperacillin/tazobactam. They are shown to have a lower risk of mortality.    Report Status 05/08/2022 FINAL  Final   Organism ID, Bacteria KLEBSIELLA PNEUMONIAE (A)  Final      Susceptibility   Klebsiella pneumoniae - MIC*    AMPICILLIN >=32 RESISTANT Resistant     CEFAZOLIN >=64 RESISTANT Resistant     CEFEPIME >=32 RESISTANT Resistant     CEFTRIAXONE >=64 RESISTANT Resistant     CIPROFLOXACIN >=4 RESISTANT Resistant     GENTAMICIN <=1 SENSITIVE Sensitive     IMIPENEM <=0.25 SENSITIVE Sensitive     NITROFURANTOIN 128 RESISTANT Resistant     TRIMETH/SULFA >=320 RESISTANT Resistant     AMPICILLIN/SULBACTAM >=32 RESISTANT Resistant     PIP/TAZO 32 INTERMEDIATE Intermediate     * >=100,000 COLONIES/mL KLEBSIELLA PNEUMONIAE     Treated with cephalexin, organism resistant to prescribed  antimicrobial  Cephalexin will not cover ESBL Klebsiella. Recommend to stop cephalexin. Prescribe fosfomycin 3g PO once. If patient unable to obtain medication from pharmacy, recommend presentation to emergency department to receive treatment.  New antibiotic prescription: Fosfomycin 3g PO once  ED Provider: Glyn Ade,  MD   Ellis Savage, PharmD 05/09/2022, 11:07 AM Clinical Pharmacist Monday - Friday phone -  [phone] Saturday - Sunday phone - [phone]

## 2022-05-15 ENCOUNTER — Emergency Department (HOSPITAL_COMMUNITY): Payer: Medicaid Other

## 2022-05-15 ENCOUNTER — Other Ambulatory Visit: Payer: Self-pay

## 2022-05-15 ENCOUNTER — Encounter (HOSPITAL_COMMUNITY): Payer: Self-pay

## 2022-05-15 ENCOUNTER — Inpatient Hospital Stay (HOSPITAL_COMMUNITY)
Admission: EM | Admit: 2022-05-15 | Discharge: 2022-05-25 | DRG: 673 | Disposition: A | Discharge status: Home Health | Payer: Medicaid Other | Attending: Family Medicine | Admitting: Family Medicine

## 2022-05-15 DIAGNOSIS — B961 Klebsiella pneumoniae [K. pneumoniae] as the cause of diseases classified elsewhere: Secondary | ICD-10-CM | POA: Diagnosis present

## 2022-05-15 DIAGNOSIS — Z7902 Long term (current) use of antithrombotics/antiplatelets: Secondary | ICD-10-CM

## 2022-05-15 DIAGNOSIS — I5032 Chronic diastolic (congestive) heart failure: Secondary | ICD-10-CM | POA: Diagnosis present

## 2022-05-15 DIAGNOSIS — E872 Acidosis, unspecified: Secondary | ICD-10-CM | POA: Diagnosis present

## 2022-05-15 DIAGNOSIS — N179 Acute kidney failure, unspecified: Secondary | ICD-10-CM | POA: Diagnosis present

## 2022-05-15 DIAGNOSIS — E1122 Type 2 diabetes mellitus with diabetic chronic kidney disease: Secondary | ICD-10-CM | POA: Diagnosis present

## 2022-05-15 DIAGNOSIS — I5042 Chronic combined systolic (congestive) and diastolic (congestive) heart failure: Secondary | ICD-10-CM | POA: Diagnosis present

## 2022-05-15 DIAGNOSIS — Z8051 Family history of malignant neoplasm of kidney: Secondary | ICD-10-CM

## 2022-05-15 DIAGNOSIS — N185 Chronic kidney disease, stage 5: Secondary | ICD-10-CM | POA: Diagnosis not present

## 2022-05-15 DIAGNOSIS — Z800 Family history of malignant neoplasm of digestive organs: Secondary | ICD-10-CM

## 2022-05-15 DIAGNOSIS — I10 Essential (primary) hypertension: Secondary | ICD-10-CM | POA: Diagnosis not present

## 2022-05-15 DIAGNOSIS — I2510 Atherosclerotic heart disease of native coronary artery without angina pectoris: Secondary | ICD-10-CM | POA: Diagnosis present

## 2022-05-15 DIAGNOSIS — E785 Hyperlipidemia, unspecified: Secondary | ICD-10-CM | POA: Diagnosis present

## 2022-05-15 DIAGNOSIS — D631 Anemia in chronic kidney disease: Secondary | ICD-10-CM | POA: Diagnosis present

## 2022-05-15 DIAGNOSIS — F32A Depression, unspecified: Secondary | ICD-10-CM | POA: Diagnosis present

## 2022-05-15 DIAGNOSIS — Z79899 Other long term (current) drug therapy: Secondary | ICD-10-CM

## 2022-05-15 DIAGNOSIS — R0600 Dyspnea, unspecified: Principal | ICD-10-CM

## 2022-05-15 DIAGNOSIS — I132 Hypertensive heart and chronic kidney disease with heart failure and with stage 5 chronic kidney disease, or end stage renal disease: Secondary | ICD-10-CM | POA: Diagnosis present

## 2022-05-15 DIAGNOSIS — K219 Gastro-esophageal reflux disease without esophagitis: Secondary | ICD-10-CM | POA: Diagnosis present

## 2022-05-15 DIAGNOSIS — Z8481 Family history of carrier of genetic disease: Secondary | ICD-10-CM

## 2022-05-15 DIAGNOSIS — R7989 Other specified abnormal findings of blood chemistry: Secondary | ICD-10-CM

## 2022-05-15 DIAGNOSIS — I447 Left bundle-branch block, unspecified: Secondary | ICD-10-CM | POA: Diagnosis present

## 2022-05-15 DIAGNOSIS — Z1612 Extended spectrum beta lactamase (ESBL) resistance: Secondary | ICD-10-CM | POA: Diagnosis present

## 2022-05-15 DIAGNOSIS — E1140 Type 2 diabetes mellitus with diabetic neuropathy, unspecified: Secondary | ICD-10-CM | POA: Diagnosis present

## 2022-05-15 DIAGNOSIS — Z9079 Acquired absence of other genital organ(s): Secondary | ICD-10-CM

## 2022-05-15 DIAGNOSIS — N390 Urinary tract infection, site not specified: Principal | ICD-10-CM | POA: Diagnosis present

## 2022-05-15 DIAGNOSIS — E11319 Type 2 diabetes mellitus with unspecified diabetic retinopathy without macular edema: Secondary | ICD-10-CM | POA: Diagnosis present

## 2022-05-15 DIAGNOSIS — F419 Anxiety disorder, unspecified: Secondary | ICD-10-CM | POA: Diagnosis present

## 2022-05-15 DIAGNOSIS — E1165 Type 2 diabetes mellitus with hyperglycemia: Secondary | ICD-10-CM | POA: Diagnosis present

## 2022-05-15 DIAGNOSIS — Z823 Family history of stroke: Secondary | ICD-10-CM

## 2022-05-15 DIAGNOSIS — E7849 Other hyperlipidemia: Secondary | ICD-10-CM | POA: Diagnosis not present

## 2022-05-15 DIAGNOSIS — E538 Deficiency of other specified B group vitamins: Secondary | ICD-10-CM | POA: Diagnosis present

## 2022-05-15 DIAGNOSIS — E119 Type 2 diabetes mellitus without complications: Secondary | ICD-10-CM

## 2022-05-15 DIAGNOSIS — N186 End stage renal disease: Secondary | ICD-10-CM | POA: Diagnosis present

## 2022-05-15 DIAGNOSIS — Z888 Allergy status to other drugs, medicaments and biological substances status: Secondary | ICD-10-CM | POA: Diagnosis not present

## 2022-05-15 DIAGNOSIS — Z832 Family history of diseases of the blood and blood-forming organs and certain disorders involving the immune mechanism: Secondary | ICD-10-CM

## 2022-05-15 DIAGNOSIS — J029 Acute pharyngitis, unspecified: Secondary | ICD-10-CM | POA: Diagnosis present

## 2022-05-15 DIAGNOSIS — E871 Hypo-osmolality and hyponatremia: Secondary | ICD-10-CM | POA: Diagnosis present

## 2022-05-15 DIAGNOSIS — Z560 Unemployment, unspecified: Secondary | ICD-10-CM

## 2022-05-15 DIAGNOSIS — I509 Heart failure, unspecified: Secondary | ICD-10-CM | POA: Diagnosis not present

## 2022-05-15 DIAGNOSIS — B9689 Other specified bacterial agents as the cause of diseases classified elsewhere: Secondary | ICD-10-CM | POA: Diagnosis not present

## 2022-05-15 DIAGNOSIS — Z806 Family history of leukemia: Secondary | ICD-10-CM

## 2022-05-15 DIAGNOSIS — Z91040 Latex allergy status: Secondary | ICD-10-CM

## 2022-05-15 DIAGNOSIS — Z8249 Family history of ischemic heart disease and other diseases of the circulatory system: Secondary | ICD-10-CM

## 2022-05-15 DIAGNOSIS — N401 Enlarged prostate with lower urinary tract symptoms: Secondary | ICD-10-CM | POA: Diagnosis present

## 2022-05-15 DIAGNOSIS — Z794 Long term (current) use of insulin: Secondary | ICD-10-CM | POA: Diagnosis not present

## 2022-05-15 DIAGNOSIS — Z7409 Other reduced mobility: Secondary | ICD-10-CM | POA: Diagnosis present

## 2022-05-15 DIAGNOSIS — Z7984 Long term (current) use of oral hypoglycemic drugs: Secondary | ICD-10-CM

## 2022-05-15 HISTORY — DX: Left bundle-branch block, unspecified: I44.7

## 2022-05-15 LAB — COMPREHENSIVE METABOLIC PANEL
ALT: 13 U/L (ref 0–44)
AST: 12 U/L — ABNORMAL LOW (ref 15–41)
Albumin: 3.4 g/dL — ABNORMAL LOW (ref 3.5–5.0)
Alkaline Phosphatase: 71 U/L (ref 38–126)
Anion gap: 13 (ref 5–15)
BUN: 67 mg/dL — ABNORMAL HIGH (ref 8–23)
CO2: 20 mmol/L — ABNORMAL LOW (ref 22–32)
Calcium: 8.6 mg/dL — ABNORMAL LOW (ref 8.9–10.3)
Chloride: 103 mmol/L (ref 98–111)
Creatinine, Ser: 5.4 mg/dL — ABNORMAL HIGH (ref 0.61–1.24)
GFR, Estimated: 11 mL/min — ABNORMAL LOW (ref >60)
Glucose, Bld: 187 mg/dL — ABNORMAL HIGH (ref 70–99)
Potassium: 3.7 mmol/L (ref 3.5–5.1)
Sodium: 136 mmol/L (ref 135–145)
Total Bilirubin: 0.5 mg/dL (ref 0.3–1.2)
Total Protein: 6.9 g/dL (ref 6.5–8.1)

## 2022-05-15 LAB — CBC WITH DIFFERENTIAL/PLATELET
Abs Immature Granulocytes: 0.01 10*3/uL (ref 0.00–0.07)
Basophils Absolute: 0 10*3/uL (ref 0.0–0.1)
Basophils Relative: 1 %
Eosinophils Absolute: 0.2 10*3/uL (ref 0.0–0.5)
Eosinophils Relative: 3 %
HCT: 34.3 % — ABNORMAL LOW (ref 39.0–52.0)
Hemoglobin: 11.2 g/dL — ABNORMAL LOW (ref 13.0–17.0)
Immature Granulocytes: 0 %
Lymphocytes Relative: 26 %
Lymphs Abs: 1.4 10*3/uL (ref 0.7–4.0)
MCH: 29.6 pg (ref 26.0–34.0)
MCHC: 32.7 g/dL (ref 30.0–36.0)
MCV: 90.5 fL (ref 80.0–100.0)
Monocytes Absolute: 0.5 10*3/uL (ref 0.1–1.0)
Monocytes Relative: 10 %
Neutro Abs: 3.2 10*3/uL (ref 1.7–7.7)
Neutrophils Relative %: 60 %
Platelets: 268 10*3/uL (ref 150–400)
RBC: 3.79 MIL/uL — ABNORMAL LOW (ref 4.22–5.81)
RDW: 14.8 % (ref 11.5–15.5)
WBC: 5.3 10*3/uL (ref 4.0–10.5)
nRBC: 0 % (ref 0.0–0.2)

## 2022-05-15 LAB — URINALYSIS, W/ REFLEX TO CULTURE (INFECTION SUSPECTED)
Bilirubin Urine: NEGATIVE
Glucose, UA: NEGATIVE mg/dL
Hgb urine dipstick: NEGATIVE
Ketones, ur: 5 mg/dL — AB
Nitrite: NEGATIVE
Protein, ur: >=300 mg/dL — AB
Specific Gravity, Urine: 1.009 (ref 1.005–1.030)
WBC, UA: >50 WBC/hpf (ref 0–5)
pH: 5 (ref 5.0–8.0)

## 2022-05-15 LAB — GLUCOSE, CAPILLARY: Glucose-Capillary: 102 mg/dL — ABNORMAL HIGH (ref 70–99)

## 2022-05-15 LAB — URINE CULTURE

## 2022-05-15 LAB — TROPONIN I (HIGH SENSITIVITY)
Troponin I (High Sensitivity): 62 ng/L — ABNORMAL HIGH (ref <18)
Troponin I (High Sensitivity): 78 ng/L — ABNORMAL HIGH (ref <18)

## 2022-05-15 MED ORDER — GABAPENTIN 300 MG PO CAPS
300.0000 mg | ORAL_CAPSULE | Freq: Three times a day (TID) | ORAL | Status: DC
  Administered 2022-05-15 – 2022-05-21 (×17): 300 mg via ORAL
  Filled 2022-05-15 (×17): qty 1

## 2022-05-15 MED ORDER — CLOPIDOGREL BISULFATE 75 MG PO TABS: 75.0000 mg | ORAL_TABLET | Freq: Every day | ORAL | Status: DC

## 2022-05-15 MED ORDER — ROSUVASTATIN CALCIUM 5 MG PO TABS: 10.0000 mg | ORAL_TABLET | Freq: Every day | ORAL | Status: DC

## 2022-05-15 MED ORDER — ROSUVASTATIN CALCIUM 5 MG PO TABS
10.0000 mg | ORAL_TABLET | Freq: Every day | ORAL | Status: DC
  Administered 2022-05-16 – 2022-05-25 (×10): 10 mg via ORAL
  Filled 2022-05-15 (×10): qty 2

## 2022-05-15 MED ORDER — ONDANSETRON HCL 4 MG PO TABS
4.0000 mg | ORAL_TABLET | Freq: Four times a day (QID) | ORAL | Status: DC | PRN
  Administered 2022-05-21: 4 mg via ORAL
  Filled 2022-05-15: qty 1

## 2022-05-15 MED ORDER — AMLODIPINE BESYLATE 5 MG PO TABS: 10.0000 mg | ORAL_TABLET | Freq: Every day | ORAL | Status: DC

## 2022-05-15 MED ORDER — METOCLOPRAMIDE HCL 5 MG PO TABS
5.0000 mg | ORAL_TABLET | Freq: Three times a day (TID) | ORAL | Status: DC
  Administered 2022-05-15 – 2022-05-25 (×34): 5 mg via ORAL
  Filled 2022-05-15 (×34): qty 1

## 2022-05-15 MED ORDER — ACETAMINOPHEN 325 MG PO TABS
650.0000 mg | ORAL_TABLET | Freq: Four times a day (QID) | ORAL | Status: DC | PRN
  Administered 2022-05-16 – 2022-05-25 (×7): 650 mg via ORAL
  Filled 2022-05-15 (×6): qty 2

## 2022-05-15 MED ORDER — CLOPIDOGREL BISULFATE 75 MG PO TABS
75.0000 mg | ORAL_TABLET | Freq: Every day | ORAL | Status: DC
  Administered 2022-05-16 – 2022-05-25 (×10): 75 mg via ORAL
  Filled 2022-05-15 (×10): qty 1

## 2022-05-15 MED ORDER — ACETAMINOPHEN 650 MG RE SUPP: 650.0000 mg | Freq: Four times a day (QID) | RECTAL | Status: DC | PRN

## 2022-05-15 MED ORDER — HYDRALAZINE HCL 25 MG PO TABS
100.0000 mg | ORAL_TABLET | Freq: Three times a day (TID) | ORAL | Status: DC
  Administered 2022-05-15 – 2022-05-25 (×27): 100 mg via ORAL
  Filled 2022-05-15 (×27): qty 4

## 2022-05-15 MED ORDER — SODIUM BICARBONATE 650 MG PO TABS
1300.0000 mg | ORAL_TABLET | Freq: Two times a day (BID) | ORAL | Status: DC
  Administered 2022-05-15 – 2022-05-25 (×20): 1300 mg via ORAL
  Filled 2022-05-15 (×20): qty 2

## 2022-05-15 MED ORDER — FINASTERIDE 5 MG PO TABS
5.0000 mg | ORAL_TABLET | Freq: Every day | ORAL | Status: DC
  Administered 2022-05-16 – 2022-05-25 (×10): 5 mg via ORAL
  Filled 2022-05-15 (×10): qty 1

## 2022-05-15 MED ORDER — INSULIN ASPART 100 UNIT/ML IJ SOLN
0.0000 [IU] | Freq: Three times a day (TID) | INTRAMUSCULAR | Status: DC
  Administered 2022-05-16 (×2): 2 [IU] via SUBCUTANEOUS
  Administered 2022-05-17 (×2): 1 [IU] via SUBCUTANEOUS
  Administered 2022-05-18 (×2): 2 [IU] via SUBCUTANEOUS
  Administered 2022-05-19 – 2022-05-25 (×11): 1 [IU] via SUBCUTANEOUS

## 2022-05-15 MED ORDER — INSULIN ASPART 100 UNIT/ML IJ SOLN
0.0000 [IU] | Freq: Every day | INTRAMUSCULAR | Status: DC
  Administered 2022-05-17: 2 [IU] via SUBCUTANEOUS

## 2022-05-15 MED ORDER — SODIUM CHLORIDE 0.9 % IV SOLN
500.0000 mg | Freq: Two times a day (BID) | INTRAVENOUS | Status: AC
  Administered 2022-05-16 – 2022-05-20 (×10): 500 mg via INTRAVENOUS
  Filled 2022-05-15 (×14): qty 10

## 2022-05-15 MED ORDER — LISINOPRIL 10 MG PO TABS
10.0000 mg | ORAL_TABLET | Freq: Every day | ORAL | Status: DC
  Administered 2022-05-16 – 2022-05-20 (×5): 10 mg via ORAL
  Filled 2022-05-15 (×5): qty 1

## 2022-05-15 MED ORDER — ONDANSETRON HCL 4 MG/2ML IJ SOLN: 4.0000 mg | Freq: Four times a day (QID) | INTRAMUSCULAR | Status: DC | PRN

## 2022-05-15 MED ORDER — FAMOTIDINE 20 MG PO TABS
10.0000 mg | ORAL_TABLET | Freq: Every day | ORAL | Status: DC
  Administered 2022-05-16 – 2022-05-25 (×10): 10 mg via ORAL
  Filled 2022-05-15 (×10): qty 1

## 2022-05-15 MED ORDER — FUROSEMIDE 10 MG/ML IJ SOLN
60.0000 mg | Freq: Once | INTRAMUSCULAR | Status: AC
  Administered 2022-05-15: 60 mg via INTRAVENOUS
  Filled 2022-05-15: qty 6

## 2022-05-15 MED ORDER — HEPARIN SODIUM (PORCINE) 5000 UNIT/ML IJ SOLN
5000.0000 [IU] | Freq: Three times a day (TID) | INTRAMUSCULAR | Status: DC
  Administered 2022-05-15 – 2022-05-25 (×26): 5000 [IU] via SUBCUTANEOUS
  Filled 2022-05-15 (×26): qty 1

## 2022-05-15 MED ORDER — FUROSEMIDE 40 MG PO TABS
80.0000 mg | ORAL_TABLET | Freq: Two times a day (BID) | ORAL | Status: DC
  Administered 2022-05-16 – 2022-05-20 (×9): 80 mg via ORAL
  Filled 2022-05-15 (×9): qty 2

## 2022-05-15 MED ORDER — LISINOPRIL 10 MG PO TABS: 10.0000 mg | ORAL_TABLET | Freq: Every day | ORAL | Status: DC

## 2022-05-15 MED ORDER — MELATONIN 5 MG PO TABS
10.0000 mg | ORAL_TABLET | Freq: Every evening | ORAL | Status: DC | PRN
  Administered 2022-05-15 – 2022-05-24 (×4): 10 mg via ORAL
  Filled 2022-05-15 (×6): qty 2

## 2022-05-15 MED ORDER — INSULIN GLARGINE-YFGN 100 UNIT/ML ~~LOC~~ SOLN
5.0000 [IU] | Freq: Every day | SUBCUTANEOUS | Status: DC
  Administered 2022-05-15 – 2022-05-24 (×10): 5 [IU] via SUBCUTANEOUS
  Filled 2022-05-15 (×15): qty 0.05

## 2022-05-15 MED ORDER — TAMSULOSIN HCL 0.4 MG PO CAPS
0.4000 mg | ORAL_CAPSULE | Freq: Every day | ORAL | Status: DC
  Administered 2022-05-15 – 2022-05-24 (×10): 0.4 mg via ORAL
  Filled 2022-05-15 (×10): qty 1

## 2022-05-15 MED ORDER — METOPROLOL SUCCINATE ER 25 MG PO TB24
12.5000 mg | ORAL_TABLET | Freq: Every day | ORAL | Status: DC
  Administered 2022-05-16 – 2022-05-25 (×11): 12.5 mg via ORAL
  Filled 2022-05-15 (×11): qty 1

## 2022-05-15 MED ORDER — FAMOTIDINE 20 MG PO TABS: 10.0000 mg | ORAL_TABLET | Freq: Every day | ORAL | Status: DC

## 2022-05-15 MED ORDER — POLYETHYLENE GLYCOL 3350 17 G PO PACK
17.0000 g | PACK | Freq: Every day | ORAL | Status: DC
  Administered 2022-05-15 – 2022-05-25 (×7): 17 g via ORAL
  Filled 2022-05-15 (×11): qty 1

## 2022-05-15 MED ORDER — METOPROLOL SUCCINATE ER 25 MG PO TB24: 12.5000 mg | ORAL_TABLET | Freq: Every day | ORAL | Status: DC

## 2022-05-15 MED ORDER — AMLODIPINE BESYLATE 10 MG PO TABS
10.0000 mg | ORAL_TABLET | Freq: Every day | ORAL | Status: DC
  Administered 2022-05-16 – 2022-05-25 (×10): 10 mg via ORAL
  Filled 2022-05-15 (×10): qty 1

## 2022-05-15 MED ORDER — FINASTERIDE 5 MG PO TABS: 5.0000 mg | ORAL_TABLET | Freq: Every day | ORAL | Status: DC

## 2022-05-15 NOTE — Assessment & Plan Note (Addendum)
Baseline creatinine of 3.6-4.6 per nephrology notes. Patient follows with nephrology and had plans prior to admission for AVF vs AVG placement per vascular surgery.

## 2022-05-15 NOTE — ED Provider Notes (Signed)
[place] EMERGENCY DEPARTMENT AT [facility] Provider Note   CSN: [id number] Arrival date & time: 05/15/22  1234     History  Chief Complaint  Patient presents with   fluid retention    Ryan Jenkins is a 62 y.o. male with DM2, HTN, HLD, CAD, chronic combined systolic and diastolic CHF, CKD 4, diabetic neuropathy, GERD, chronic anemia, anxiety/depression, arthritis, BPH, vitamin B12 deficiency, impaired mobility. He was recently admitted 3/6-318 with diagnosis of ESBL Klebsiella pneumonia and mild CHF exacerbation. Treated with 7 days IV meropenem. Subsequently admitted from 3/20-3/25 for again ESBL E Coli UTI. Today he is BIBGEMS from home for c/f fluid retention. Over two weeks patient has gained 10 lbs. Pt is taking lasix at home, hasn't missed any doses. Still is currently taking abx for the UTI he had. Reports he is urinating okay, feels like he is emptying alright, howver he does still have some dysuria. Over the last two days has noticed his feet/ankles begin to swell symmetrically on both sides. Denies any asymmetric swelling/pain/redness in LEs. Denies CP or SOB but thinks he is getting DOE when he walks around as well as fatigue.    HPI     Home Medications Prior to Admission medications   Medication Sig Start Date End Date Taking? Authorizing Provider  amLODipine (NORVASC) 10 MG tablet Take 1 tablet (10 mg total) by mouth daily. 02/08/21   Danford, Earl Lites, MD  Cholecalciferol (VITAMIN D3) 1.25 MG (50000 UT) CAPS Take 50,000 Units by mouth every Friday. 09/12/21   [provider]  famotidine (PEPCID) 10 MG tablet Take 1 tablet (10 mg total) by mouth daily. 04/09/22 05/09/22  Zigmund Daniel., MD  finasteride (PROSCAR) 5 MG tablet Take 1 tablet (5 mg total) by mouth daily. 07/20/20   Hoy Register, MD  furosemide (LASIX) 80 MG tablet Take 1 tablet (80 mg total) by mouth 2 (two) times daily. 03/29/22 06/27/22  Dahal, Melina Schools, MD  LANTUS SOLOSTAR  100 UNIT/ML Solostar Pen Inject 5 Units into the skin at bedtime. 03/29/22   Lorin Glass, MD  meclizine (ANTIVERT) 25 MG tablet Take 25 mg by mouth daily as needed for dizziness. 08/08/21   [provider]  metoCLOPramide (REGLAN) 5 MG tablet Take 1 tablet (5 mg total) by mouth 4 (four) times daily -  before meals and at bedtime for 14 days. After 2 weeks, follow up with your PCP for further recommendations on how to take.  If your nausea improves prior to completing the 2 week course, you can use before meals and at bedtime only as needed. 04/09/22 04/23/22  Zigmund Daniel., MD  metoprolol succinate (TOPROL-XL) 25 MG 24 hr tablet Take 0.5 tablets (12.5 mg total) by mouth daily. 03/30/22 06/28/22  Lorin Glass, MD  oxyCODONE (OXY IR/ROXICODONE) 5 MG immediate release tablet Take 5 mg by mouth every 8 (eight) hours as needed (for pain).    [provider]  polyethylene glycol (MIRALAX / GLYCOLAX) 17 g packet Take 17 g by mouth daily. 03/30/22   Lorin Glass, MD  rosuvastatin (CRESTOR) 10 MG tablet Take 10 mg by mouth daily.    [provider]  senna-docusate (SENOKOT-S) 8.6-50 MG tablet Take 1 tablet by mouth at bedtime. Patient taking differently: Take 1 tablet by mouth at bedtime as needed for mild constipation. 03/29/22 06/27/22  Lorin Glass, MD  sodium bicarbonate 650 MG tablet Take 1,300 mg by mouth 2 (two) times daily. 09/20/21   [provider]  tamsulosin (FLOMAX) 0.4 MG CAPS capsule Take 0.4 mg by mouth at bedtime. 05/25/21   [provider]  triamcinolone ointment (KENALOG) 0.5 % Apply 1 Application topically 2 (two) times daily as needed (for itching). 03/27/22   [provider]  TYLENOL 500 MG tablet Take 500-1,000 mg by mouth every 6 (six) hours as needed for mild pain or headache.    [provider]      Allergies    Claritin [loratadine], Hydrochlorothiazide, Latex, Lyrica [pregabalin], and Metformin and related    Review  of Systems   Review of Systems Review of systems Negative for f/c, CP, cough, hemoptysis.  A 10 point review of systems was performed and is negative unless otherwise reported in HPI.  Physical Exam Updated Vital Signs BP (!) 180/85 (BP Location: Left Arm)   Pulse 87   Temp 98.2 F (36.8 C) (Oral)   Resp 18   Ht 6\' 2"  (1.88 m)   Wt 107.7 kg   SpO2 100%   BMI 30.48 kg/m  Physical Exam General: Normal appearing male, lying in bed.  HEENT: PERRLA, Sclera anicteric, MMM, trachea midline.  Cardiology: RRR, no murmurs/rubs/gallops. BL radial and DP pulses equal bilaterally.  Resp: Normal respiratory rate and effort. CTAB, no wheezes, rhonchi, crackles.  Abd: Soft, non-tender, non-distended. No rebound tenderness or guarding.  GU: Deferred. MSK: No pitting edema. No signs of trauma. Extremities without deformity or TTP. No cyanosis or clubbing. Skin: warm, dry. No rashes or lesions. Back: No CVA tenderness Neuro: A&Ox4, CNs II-XII grossly intact. MAEs. Sensation grossly intact.  Psych: Normal mood and affect.   ED Results / Procedures / Treatments   Labs (all labs ordered are listed, but only abnormal results are displayed) Labs Reviewed - No data to display  EKG None  Radiology No results found.  Procedures Procedures    Medications Ordered in ED Medications  furosemide (LASIX) injection 60 mg (60 mg Intravenous Given 05/15/22 1513)    ED Course/ Medical Decision Making/ A&P                          Medical Decision Making Amount and/or Complexity of Data Reviewed Labs: ordered. Decision-making details documented in ED Course. Radiology: ordered. Decision-making details documented in ED Course.  Risk Prescription drug management. Decision regarding hospitalization.    This patient presents to the ED for concern of swelling/weight gain, this involves an extensive number of treatment options, and is a complaint that carries with it a high risk of complications  and morbidity.  I considered the following differential and admission for this acute, potentially life threatening condition.   MDM:    Consider fluid overload in s/o known CHF or CHF exacerbation for patient. He has been compliant with lasix at home but possible he needs higher dose w/ his poor renal function. He has no asymmetric symptoms to indicate DVT, no CP to indicate PE. W/ DOE consider ACS/arrhythmia. He is still having some dysuria, which could indicate incompletely treated UTI after his ESBL klebsiella recently, and he is fatigued, and renal function worsening, will get UA. No flank pain to indicate nephrolithiasis/pyelonephritis/hydronephrosis. The triage note stated he was having urinary retention but patient actually has no complaint of that, just fluid retention. Consider worsening renal function causing swelling or nephrotic/nephritic syndrome. Patient has poor renal function at baseline, Cr baseline seems to be anywhere from 3-5. Peak during 2nd to last hospitalization was 7.0, got  back to down to 3.9 on 3/21 and now is uptrending again to 5.4.  Patient is signed out to the oncoming ED physician Dr. Elpidio Anis who is made aware of his history, presentation, exam, workup, and plan. Plan is to obtain EKG/trops and likely admit patient for diuresis in s/o worsening renal function.    Clinical Course as of 05/26/22 1410  Tue May 15, 2022  1429 DG Chest 2 View FINDINGS: The cardiac silhouette is borderline enlarged convex situated by AP technique and low lung volumes. No airspace consolidation, edema, pleural effusion, or pneumothorax is identified. No acute osseous abnormality is seen.  IMPRESSION: No active cardiopulmonary disease.   [HN]  1429 Hemoglobin(!): 11.2 At baseline [HN]  1429 WBC: 5.3 No leukocytosis [HN]  1507 12 M with DM2, HTN, HLD, CAD, chronic combined systolic and diastolic CHF, CKD 4 sent here for weight gain and CHF sx. Sent by cardiologist for IV lasix.  Follow up EKG/trops. [VB]  1846 Patient's repeat troponin up trended 78 from 62.  He has no active chest pain on repeat exam.  His EKG has left bundle branch block which is known and no Sgarbossa or acute changes from prior.  Suspect demand ischemia from underlying CHF.  Creatinine 5.4 at baseline and UA consistent with UTI many bacteria greater than 50 WBCs leukocyte Estrace and white blood cell clumps present.  He is still having burning urination.  Last culture showed Klebsiella resistant to multiple antibiotics except for imipenem and gentamicin.  Discussed with pharmacist who will start meropenem.  Will consult hospitalist for admission for continued treatment of resistant Klebsiella UTI and continued IV diuresis. [VB]  1905 S/w Dr Imogene Burn for admission for continued IV diuresis and UTI management.  Recent culture Klebsiella resistant [VB]    Clinical Course User Index [HN] Loetta Rough, MD [VB] Mardene Sayer, MD    Labs: I Ordered, and personally interpreted labs.  The pertinent results include:  those listed above  Imaging Studies ordered: I ordered imaging studies including CXR I independently visualized and interpreted imaging. I agree with the radiologist interpretation  Additional history obtained from chart review.    Cardiac Monitoring: The patient was maintained on a cardiac monitor.  I personally viewed and interpreted the cardiac monitored which showed an underlying rhythm of: NSR  Reevaluation: After the interventions noted above, I reevaluated the patient and found that they have :improved  Social Determinants of Health: Patient lives independently   Disposition:  Admit  Co morbidities that complicate the patient evaluation  Past Medical History:  Diagnosis Date   Anemia    Anginal pain (HCC)    Anxiety    Arthritis    BPH with obstruction/lower urinary tract symptoms    CAD (coronary artery disease)    cardiologist--- dr Lucienne Minks badal;  11-06-2019  cardiac cath in [place] (result in care everywhere)  nonobstructive cad involing pRCA 60% (done in setting worseing CHF/ acute pulmonary edema requiring intubation/ AKI   Chronic combined systolic and diastolic CHF (congestive heart failure) (HCC)    Chronic kidney disease, stage IV (severe) [facility])    nephrologist--- dr Ronalee Belts   Depression    Diabetic neuropathy (HCC)    Dyspnea    Edema of both lower extremities    GERD (gastroesophageal reflux disease)    Heart murmur    History of community acquired pneumonia    admission 06-04-2021 [facility]  w/ ARF hypoxia w/ severe sepsis   Hyperlipidemia    Hypertension  Insulin dependent type 2 diabetes mellitus (HCC)    Pneumonia    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   I have reviewed the patients home medicines and have made adjustments as needed  Problem List / ED Course: Problem List Items Addressed This Visit       Genitourinary   RESOLVED: UTI (urinary tract infection)   Other Visit Diagnoses     Dyspnea, unspecified type    -  Primary   Elevated troponin                       This note was created using dictation software, which may contain spelling or grammatical errors.    Loetta Rough, MD 05/26/22 [id number]

## 2022-05-15 NOTE — ED Notes (Signed)
Pt walked with walker approx. 50 feet and O2 remained at 100 % entire time. Pt did state he felt a little more short of breath than normal but was breathing normal.

## 2022-05-15 NOTE — Assessment & Plan Note (Addendum)
Continue Crestor 10 mg daily. 

## 2022-05-15 NOTE — Assessment & Plan Note (Addendum)
Continue famotidine 

## 2022-05-15 NOTE — Assessment & Plan Note (Addendum)
Mostly controlled. Patient is managed on lisinopril, amlodipine and metoprolol as an outpatient. Lisinopril discontinued this admission. -Continue amlodipine and metoprolol succinate

## 2022-05-15 NOTE — Assessment & Plan Note (Addendum)
Uncontrolled with hyperglycemia. Last hemoglobin A1C is improved to 7.4% from 8.4%. Patient is managed on glipizide and Lantus as an outpatient. Discontinue Glipizide and continue Lantus.

## 2022-05-15 NOTE — H&P (Signed)
History and Physical    Ryan Jenkins ZOX:[id number] DOB: 04/27/1960 DOA: 05/15/2022  DOS: the patient was seen and examined on 05/15/2022  PCP: Malka So., MD   Patient coming from: Home  I have personally briefly reviewed patient's old medical records in [place] [facility]  CC: edema legs HPI: 62 year old African-American male history of CKD stage V, type 2 diabetes, hypertension, history of multidrug-resistant urinary tract infections including ESBL E. coli and most recently ESBL Klebsiella, reflux, hyperlipidemia, anemia of chronic kidney disease presents the ER today with complaints of lower extremity edema and fluid retention for 2 weeks.  He states he has gained about 10 pounds.  He has been taking his Lasix at home.  He was seen on 05/04/2022.  He had a UTI.  He was given p.o. Keflex.  Urine culture ended up growing Klebsiella that was ESBL.  Patient complains of dysuria.  Denies any fever.  On arrival temp 98 heart rate 92 blood pressure 162/97 satting 100% on room air.  Patient walked 50 feet with a walker and his O2 saturations remained 100%.  Sodium 136, potassium 3.7, bicarb of 20, BUN of 67, creatinine 5.4, glucose 187  UA showed large leukocyte esterase, many bacteria, greater than 50 white cells.  White count 5.3, hemoglobin 11.2, platelets of 268  Chest x-ray was negative for pulmonary edema.  EKG shows normal sinus rhythm with a left bundle branch block, interpretation.  Patient has Dr. Derrell Lolling as his PCP who is with [facility].   ED Course: WBC 5.3. no fever. Able to walk 50 feet with 100% RA sats. CXR negative for pulmonary edema. EDP gave 60 mg IV lasix. No LE edema noted on my exam.  Review of Systems:  Review of Systems  Constitutional: Negative.  Negative for fever.  HENT: Negative.    Eyes: Negative.   Cardiovascular:  Positive for leg swelling.  Gastrointestinal: Negative.   Genitourinary:  Positive for dysuria.  Musculoskeletal:  Positive  for back pain.  Skin: Negative.   Neurological: Negative.   Endo/Heme/Allergies: Negative.   Psychiatric/Behavioral: Negative.    All other systems reviewed and are negative.   Past Medical History:  Diagnosis Date   Anemia    Anginal pain (HCC)    Anxiety    Arthritis    BPH with obstruction/lower urinary tract symptoms    CAD (coronary artery disease)    cardiologist--- dr Lucienne Minks badal;  11-06-2019 cardiac cath in [place] (result in care everywhere)  nonobstructive cad involing pRCA 60% (done in setting worseing CHF/ acute pulmonary edema requiring intubation/ AKI   Chronic combined systolic and diastolic CHF (congestive heart failure) (HCC)    Chronic kidney disease, stage IV (severe) (HCC)    nephrologist--- dr Ronalee Belts   Depression    Diabetic neuropathy (HCC)    Dyspnea    Edema of both lower extremities    Gastric ulcer without hemorrhage or perforation 12/25/2018   GERD (gastroesophageal reflux disease)    Heart murmur    History of community acquired pneumonia    ad[facility]20[facility]  w/ [facility]F hypoxia w/ severe sepsis   Hyperlipidemia    Hypertension    Insulin dependent type 2 diabetes mellitus (HCC)    Pneumonia    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 He has never been exposed to tobacco smoke. He has never used smokeless tobacco. He reports that he does not drink alcohol and does not use  drugs.  Allergies  Allergen Reactions   Claritin [Loratadine] Swelling and Other (See Comments)    Joint swelling    Hydrochlorothiazide Other (See Comments)    Dizziness   Latex Hives and Swelling   Lyrica [Pregabalin] Other (See Comments)    Depression. "Makes me loopy"    Metformin And Related Diarrhea and Nausea And Vomiting    Family History  Problem Relation Age of Onset   Renal cancer Mother    Hypertension Mother    Pancreatic cancer Mother    Hypertension Sister    Stroke Sister    Leukemia Maternal Uncle    Sickle cell trait Maternal Aunt    Colon cancer Neg Hx    Esophageal cancer Neg Hx    Rectal cancer Neg Hx    Stomach cancer Neg Hx     Prior to Admission medications   Medication Sig Start Date End Date Taking? Authorizing Provider  amLODipine (NORVASC) 10 MG tablet Take 1 tablet (10 mg total) by mouth daily. 02/08/21  Yes Danford, Earl Lites, MD  Cholecalciferol (VITAMIN D3) 1.25 MG (50000 UT) CAPS Take 50,000 Units by mouth every Friday. 09/12/21  Yes [provider]  clopidogrel (PLAVIX) 75 MG tablet Take 75 mg by mouth daily. 05/10/22  Yes [provider]  doxycycline (VIBRAMYCIN) 100 MG capsule Take 100 mg by mouth 2 (two) times daily. Unsure if he's taking TID 04/20/22  Yes [provider]  famotidine (PEPCID) 10 MG tablet Take 1 tablet (10 mg total) by mouth daily. 04/09/22 05/15/22 Yes Zigmund Daniel., MD  finasteride (PROSCAR) 5 MG tablet Take 1 tablet (5 mg total) by mouth daily. 07/20/20  Yes Hoy Register, MD  furosemide (LASIX) 80 MG tablet Take 1 tablet (80 mg total) by mouth 2 (two) times daily. 03/29/22 06/27/22 Yes Dahal, Melina Schools, MD  gabapentin (NEURONTIN) 300 MG capsule Take 300 mg by mouth 3 (three) times daily. 04/27/22  Yes [provider]  glipiZIDE (GLUCOTROL) 5 MG tablet Take 5 mg by mouth daily. 04/25/22  Yes [provider]  hydrALAZINE (APRESOLINE) 100 MG tablet Take 100 mg by mouth 3 (three)  times daily. 04/25/22  Yes [provider]  LANTUS SOLOSTAR 100 UNIT/ML Solostar Pen Inject 5 Units into the skin at bedtime. 03/29/22  Yes Dahal, Melina Schools, MD  lisinopril (ZESTRIL) 10 MG tablet Take 10 mg by mouth daily. 04/27/22  Yes [provider]  metoCLOPramide (REGLAN) 5 MG tablet Take 1 tablet (5 mg total) by mouth 4 (four) times daily -  before meals and at bedtime for 14 days. After 2 weeks, follow up with your PCP for further recommendations on how to take.  If your nausea improves prior to completing the 2 week course, you can use before meals and at bedtime only as needed. 04/09/22 05/15/22 Yes Zigmund Daniel., MD  metoprolol succinate (TOPROL-XL) 25 MG 24 hr tablet Take  0.5 tablets (12.5 mg total) by mouth daily. 03/30/22 06/28/22 Yes Dahal, Melina Schools, MD  oxyCODONE (OXY IR/ROXICODONE) 5 MG immediate release tablet Take 5 mg by mouth every 8 (eight) hours as needed (for pain).   Yes [provider]  polyethylene glycol (MIRALAX / GLYCOLAX) 17 g packet Take 17 g by mouth daily. 03/30/22  Yes Dahal, Melina Schools, MD  potassium chloride (KLOR-CON M) 10 MEQ tablet Take 10 mEq by mouth daily. 04/25/22  Yes [provider]  rosuvastatin (CRESTOR) 10 MG tablet Take 10 mg by mouth daily.   Yes [provider]  sodium bicarbonate 650 MG tablet Take 1,300 mg by mouth 2 (two) times daily. 09/20/21  Yes [provider]  tamsulosin (FLOMAX) 0.4 MG CAPS capsule Take 0.4 mg by mouth at bedtime. 05/25/21  Yes [provider]  triamcinolone ointment (KENALOG) 0.5 % Apply 1 Application topically 2 (two) times daily as needed (for itching). 03/27/22  Yes [provider]  TYLENOL 500 MG tablet Take 500-1,000 mg by mouth every 6 (six) hours as needed for mild pain or headache.   Yes [provider]  meclizine (ANTIVERT) 25 MG tablet Take 25 mg by mouth daily as needed for dizziness. 08/08/21   [provider]  senna-docusate (SENOKOT-S)  8.6-50 MG tablet Take 1 tablet by mouth at bedtime. Patient not taking: Reported on 05/15/2022 03/29/22 06/27/22  Lorin Glass, MD    Physical Exam: Vitals:   05/15/22 1627 05/15/22 1630 05/15/22 1715 05/15/22 1845  BP:  (!) 165/79 (!) 169/90 (!) 171/99  Pulse:  91 89 90  Resp:  18 14 13   Temp: 98.2 F (36.8 C)     TempSrc: Oral     SpO2:  99% 99% 100%  Weight:      Height:        Physical Exam Vitals and nursing note reviewed.  Constitutional:      General: He is not in acute distress.    Appearance: He is normal weight. He is not toxic-appearing or diaphoretic.  HENT:     Head: Normocephalic and atraumatic.     Nose: Nose normal.  Eyes:     General: No scleral icterus. Cardiovascular:     Rate and Rhythm: Normal rate and regular rhythm.     Pulses: Normal pulses.  Pulmonary:     Effort: Pulmonary effort is normal.     Breath sounds: Normal breath sounds. No rales.  Abdominal:     General: Abdomen is flat. Bowel sounds are normal. There is no distension.     Palpations: Abdomen is soft.  Musculoskeletal:     Right lower leg: No edema.     Left lower leg: No edema.     Comments: No pretibial, ankle or pedal edema noted.  Skin:    General: Skin is warm and dry.     Capillary Refill: Capillary refill takes less than 2 seconds.  Neurological:     General: No focal deficit present.     Mental Status: He is alert and oriented to person, place, and time.      Labs on Admission: I have personally reviewed following labs and imaging studies  CBC: Recent Labs  Lab 05/15/22 1318  WBC 5.3  NEUTROABS 3.2  HGB 11.2*  HCT 34.3*  MCV 90.5  PLT 268   Basic Metabolic Panel: Recent Labs  Lab 05/15/22 1318  NA 136  K 3.7  CL 103  CO2 20*  GLUCOSE 187*  BUN 67*  CREATININE 5.40*  CALCIUM 8.6*   GFR: Estimated Creatinine Clearance: 16.7 mL/min (A) (by C-G formula based on SCr of 5.4 mg/dL (H)). Liver Function Tests: Recent Labs  Lab 05/15/22 1318  AST 12*   ALT 13  ALKPHOS 71  BILITOT 0.5  PROT 6.9  ALBUMIN 3.4*   Cardiac Enzymes: Recent Labs  Lab 05/15/22 1449 05/15/22 1654  TROPONINIHS 62* 78*   BNP (last 3 results) Recent Labs    03/21/22 1745 04/04/22 1434 05/04/22 0652  BNP 1,113.1* 503.7* 393.4*   Urine analysis:    Component Value Date/Time   COLORURINE YELLOW 05/15/2022 1651   APPEARANCEUR CLOUDY (A) 05/15/2022 1651   LABSPEC 1.009 05/15/2022 1651   PHURINE 5.0 05/15/2022 1651   GLUCOSEU NEGATIVE 05/15/2022 1651   HGBUR NEGATIVE 05/15/2022 1651   BILIRUBINUR NEGATIVE 05/15/2022 1651   KETONESUR 5 (A) 05/15/2022 1651   PROTEINUR >=300 (A) 05/15/2022 1651   NITRITE NEGATIVE 05/15/2022 1651   LEUKOCYTESUR LARGE (A) 05/15/2022 1651    Culture  Abnormal   >=100,000 COLONIES/mL KLEBSIELLA PNEUMONIAE Confirmed Extended Spectrum Beta-Lactamase Producer (ESBL).  In bloodstream infections from ESBL organisms, carbapenems are preferred over piperacillin/tazobactam. They are shown to have a lower risk of mortality.  Report Status  05/08/2022 FINAL  Organism ID, Bacteria  Abnormal   KLEBSIELLA PNEUMONIAE   Susceptibility   Klebsiella pneumoniae    MIC   AMPICILLIN >=32 RESIST... R   AMPICILLIN/SULBACTAM >=32 RESIST... R   CEFAZOLIN >=64 RESIST... R   CEFEPIME >=32 RESIST... R   CEFTRIAXONE >=64 RESIST... R   CIPROFLOXACIN >=4 RESISTANT R   GENTAMICIN <=1 SENSITIVE S   IMIPENEM <=0.25 SENS... S   NITROFURANTOIN 128 RESISTANT R   PIP/TAZO 32 INTERMED... I   TRIMETH/SULFA >=320 RESIS... R          Susceptibility Comments  Klebsiella pneumoniae  >=100,000 COLONIES/mL KLEBSIELLA PNEUMONIAE    Radiological Exams on Admission: I have personally reviewed images DG Chest 2 View  Result Date: 05/15/2022 CLINICAL DATA:  Shortness of breath. EXAM: CHEST - 2 VIEW COMPARISON:  Chest radiographs 05/04/2022 FINDINGS: The cardiac silhouette is borderline enlarged convex situated by AP technique and low lung volumes. No  airspace consolidation, edema, pleural effusion, or pneumothorax is identified. No acute osseous abnormality is seen. IMPRESSION: No active cardiopulmonary disease. Electronically Signed   By: Sebastian Ache M.D.   On: 05/15/2022 13:25    EKG: My personal interpretation of EKG shows: NSR, LBBB    Assessment/Plan Principal Problem:   Urinary tract infection due to ESBL Klebsiella Active Problems:   CKD (chronic kidney disease) stage 5, GFR less than 15 ml/min (HCC)   DM2 (diabetes mellitus, type 2) (HCC)   Essential hypertension   HLD (hyperlipidemia)   Gastroesophageal reflux disease without esophagitis   Anemia in chronic kidney disease (CKD)   Chronic diastolic heart failure (HCC)    Assessment and Plan: * Urinary tract infection due to ESBL Klebsiella Admit to med/surg bed. Urine cx from 05-04-2022 growing ESBL Klebsiella. Sensitive only to imipenem and gentamcin. Have started meropenem. May need ID consult.  CKD (chronic kidney disease) stage 5, GFR less than 15 ml/min (HCC) Follows with [facility]. Has been vascular surgery to schedule AVF vs AVG placement.  Chronic diastolic heart failure (HCC) Stable. Euvolemic. No swelling noted in his legs/feet. Cxr negative for pulmonary edema. RA O2 sats 100%.  Anemia in chronic kidney disease (CKD) Chronic. Stable.  Gastroesophageal reflux disease without esophagitis Continue with pepcid and po bicarb.  HLD (hyperlipidemia) Continue crestor 10 mg  Essential hypertension Stable.  DM2 (diabetes mellitus, type 2) (HCC) Chronic.   DVT prophylaxis: SQ Heparin Code Status: Full Code Family Communication: no family at bedside  Disposition Plan: return home  Consults called: none  Admission status: Inpatient, Med-Surg   Carollee Herter, DO [facility] 05/15/2022, 7:39 PM

## 2022-05-15 NOTE — ED Notes (Signed)
Urine culture was already done after abnormal urinalysis

## 2022-05-15 NOTE — Assessment & Plan Note (Addendum)
Chronic Stable 

## 2022-05-15 NOTE — ED Provider Notes (Signed)
Clinical Course as of 05/15/22 1905  Tue May 15, 2022  1429 DG Chest 2 View FINDINGS: The cardiac silhouette is borderline enlarged convex situated by AP technique and low lung volumes. No airspace consolidation, edema, pleural effusion, or pneumothorax is identified. No acute osseous abnormality is seen.  IMPRESSION: No active cardiopulmonary disease.   [HN]  1429 Hemoglobin(!): 11.2 At baseline [HN]  1429 WBC: 5.3 No leukocytosis [HN]  1507 48 M with DM2, HTN, HLD, CAD, chronic combined systolic and diastolic CHF, CKD 4 sent here for weight gain and CHF sx. Sent by cardiologist for IV lasix. Follow up EKG/trops. [VB]  1846 Patient's repeat troponin up trended 78 from 62.  He has no active chest pain on repeat exam.  His EKG has left bundle branch block which is known and no Sgarbossa or acute changes from prior.  Suspect demand ischemia from underlying CHF.  Creatinine 5.4 at baseline and UA consistent with UTI many bacteria greater than 50 WBCs leukocyte Estrace and white blood cell clumps present.  He is still having burning urination.  Last culture showed Klebsiella resistant to multiple antibiotics except for imipenem and gentamicin.  Discussed with pharmacist who will start meropenem.  Will consult hospitalist for admission for continued treatment of resistant Klebsiella UTI and continued IV diuresis. [VB]  1905 S/w Dr Imogene Burn for admission for continued IV diuresis and UTI management.  Recent culture Klebsiella resistant [VB]    Clinical Course User Index [HN] Loetta Rough, MD [VB] Mardene Sayer, MD      Mardene Sayer, MD 05/15/22 Ebony Cargo

## 2022-05-15 NOTE — ED Triage Notes (Signed)
Pt BIBGEMS from home for fluid and urinary retention. Over two weeks patient has gained 10 lbs. Pt is taking lasix at home. Alert and oriented x4. Dyspnea on exertion, pt baseline able to turn and stand. Pt reports fatigue. Started tx last week for UTI.   BP 170/88 HR 85 Rr 18 99% RA CBG 128  Temp 98.5  CHF and CKD

## 2022-05-15 NOTE — Assessment & Plan Note (Addendum)
Urine culture significant for ESBL Klebsiella pneumoniae sensitive to imipenem. Patient completed a 5 day course of meropenem.

## 2022-05-15 NOTE — Subjective & Objective (Signed)
CC: edema legs HPI: 62 year old African-American male history of CKD stage V, type 2 diabetes, hypertension, history of multidrug-resistant urinary tract infections including ESBL E. coli and most recently ESBL Klebsiella, reflux, hyperlipidemia, anemia of chronic kidney disease presents the ER today with complaints of lower extremity edema and fluid retention for 2 weeks.  He states he has gained about 10 pounds.  He has been taking his Lasix at home.  He was seen on 05/04/2022.  He had a UTI.  He was given p.o. Keflex.  Urine culture ended up growing Klebsiella that was ESBL.  Patient complains of dysuria.  Denies any fever.  On arrival temp 98 heart rate 92 blood pressure 162/97 satting 100% on room air.  Patient walked 50 feet with a walker and his O2 saturations remained 100%.  Sodium 136, potassium 3.7, bicarb of 20, BUN of 67, creatinine 5.4, glucose 187  UA showed large leukocyte esterase, many bacteria, greater than 50 white cells.  White count 5.3, hemoglobin 11.2, platelets of 268  Chest x-ray was negative for pulmonary edema.  EKG shows normal sinus rhythm with a left bundle branch block, interpretation.  Patient has Dr. Derrell Lolling as his PCP who is with [facility].

## 2022-05-15 NOTE — ED Notes (Signed)
ED TO INPATIENT HANDOFF REPORT  ED Nurse Name and Phone #:  Pennie Rushing A. Hoyle Barkdull, RN [phone]  S Name/Age/Gender Ryan Jenkins 62 y.o. male Room/Bed: 043C/043C  Code Status   Code Status: Full Code  Home/SNF/Other Home Patient oriented to: self, place, time, and situation Is this baseline? Yes   Triage Complete: Triage complete  Chief Complaint Urinary tract infection due to ESBL Klebsiella [N39.0, B96.89]  Triage Note Pt BIBGEMS from home for fluid and urinary retention. Over two weeks patient has gained 10 lbs. Pt is taking lasix at home. Alert and oriented x4. Dyspnea on exertion, pt baseline able to turn and stand. Pt reports fatigue. Started tx last week for UTI.   BP 170/88 HR 85 Rr 18 99% RA CBG 128  Temp 98.5  CHF and CKD    Allergies Allergies  Allergen Reactions   Claritin [Loratadine] Swelling and Other (See Comments)    Joint swelling    Hydrochlorothiazide Other (See Comments)    Dizziness   Latex Hives and Swelling   Lyrica [Pregabalin] Other (See Comments)    Depression. "Makes me loopy"    Metformin And Related Diarrhea and Nausea And Vomiting    Level of Care/Admitting Diagnosis ED Disposition     ED Disposition  Admit   Condition  --   Comment  Hospital Area: [facility] [[id number]]  Level of Care: Med-Surg [[id number]]  May admit patient to [facility] or [facility] if equivalent level of care is available:: No  Covid Evaluation: Asymptomatic - no recent exposure (last 10 days) testing not required  Diagnosis: Urinary tract infection due to ESBL Klebsiella [[id number]]  Admitting Physician: Imogene Burn, ERIC [[id number]]  Attending Physician: Imogene Burn, ERIC [[id number]]  Certification:: I certify this patient will need inpatient services for at least 2 midnights  Estimated Length of Stay: 5          B Medical/Surgery History Past Medical History:  Diagnosis Date   Anemia    Anginal pain (HCC)    Anxiety    Arthritis    BPH with  obstruction/lower urinary tract symptoms    CAD (coronary artery disease)    cardiologist--- dr Lucienne Minks badal;  11-06-2019 cardiac cath in [place] (result in care everywhere)  nonobstructive cad involing pRCA 60% (done in setting worseing CHF/ acute pulmonary edema requiring intubation/ AKI   Chronic combined systolic and diastolic CHF (congestive heart failure) (HCC)    Chronic kidney disease, stage IV (severe) (HCC)    nephrologist--- dr Ronalee Belts   Depression    Diabetic neuropathy (HCC)    Dyspnea    Edema of both lower extremities    Gastric ulcer without hemorrhage or perforation 12/25/2018   GERD (gastroesophageal reflux disease)    Heart murmur    History of community acquired pneumonia    ad[facility]  w/ [facility]F hypoxia w/ severe sepsis   Hyperlipidemia    Hypertension    Insulin dependent type 2 diabetes mellitus (HCC)    LBBB (left bundle branch block)    Pneumonia    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)    Uses walker    Vitamin B12 deficiency    Vitreous hemorrhage [facility])    Past Surgical History:  Procedure Laterality Date   CARDIAC CATHETERIZATION  11/06/2019   [facility] in [place];    nonobstructive CAD , pRCA 60% (result in care everywhere)   CATARACT EXTRACTION W/ INTRAOCULAR LENS IMPLANT Bilateral 2017   TRANSURETHRAL RESECTION OF  PROSTATE N/A 10/16/2021   Procedure: TRANSURETHRAL RESECTION OF THE PROSTATE (TURP);  Surgeon: Jannifer Hick, MD;  Location: [facility];  Service: Urology;  Laterality: N/A;     A IV Location/Drains/Wounds Patient Lines/Drains/Airways Status     Active Line/Drains/Airways     Name Placement date Placement time Site Days   Peripheral IV 05/15/22 20 G Left Antecubital 05/15/22  1321  Antecubital  less than 1            Intake/Output Last 24 hours  Intake/Output Summary (Last 24 hours) at 05/15/2022 2035 Last data filed at 05/15/2022 1836 Gross per 24 hour  Intake --  Output 1300 ml  Net  -1300 ml    Labs/Imaging Results for orders placed or performed during the hospital encounter of 05/15/22 (from the past 48 hour(s))  CBC with Differential     Status: Abnormal   Collection Time: 05/15/22  1:18 PM  Result Value Ref Range   WBC 5.3 4.0 - 10.5 K/uL   RBC 3.79 (L) 4.22 - 5.81 MIL/uL   Hemoglobin 11.2 (L) 13.0 - 17.0 g/dL   HCT 40.9 (L) 81.1 - 91.4 %   MCV 90.5 80.0 - 100.0 fL   MCH 29.6 26.0 - 34.0 pg   MCHC 32.7 30.0 - 36.0 g/dL   RDW 78.2 95.6 - 21.3 %   Platelets 268 150 - 400 K/uL   nRBC 0.0 0.0 - 0.2 %   Neutrophils Relative % 60 %   Neutro Abs 3.2 1.7 - 7.7 K/uL   Lymphocytes Relative 26 %   Lymphs Abs 1.4 0.7 - 4.0 K/uL   Monocytes Relative 10 %   Monocytes Absolute 0.5 0.1 - 1.0 K/uL   Eosinophils Relative 3 %   Eosinophils Absolute 0.2 0.0 - 0.5 K/uL   Basophils Relative 1 %   Basophils Absolute 0.0 0.0 - 0.1 K/uL   Immature Granulocytes 0 %   Abs Immature Granulocytes 0.01 0.00 - 0.07 K/uL    Comment: Performed at [facility], [address], [place], [place] [address]  Comprehensive metabolic panel     Status: Abnormal   Collection Time: 05/15/22  1:18 PM  Result Value Ref Range   Sodium 136 135 - 145 mmol/L   Potassium 3.7 3.5 - 5.1 mmol/L   Chloride 103 98 - 111 mmol/L   CO2 20 (L) 22 - 32 mmol/L   Glucose, Bld 187 (H) 70 - 99 mg/dL    Comment: Glucose reference range applies only to samples taken after fasting for at least 8 hours.   BUN 67 (H) 8 - 23 mg/dL   Creatinine, Ser 8.46 (H) 0.61 - 1.24 mg/dL   Calcium 8.6 (L) 8.9 - 10.3 mg/dL   Total Protein 6.9 6.5 - 8.1 g/dL   Albumin 3.4 (L) 3.5 - 5.0 g/dL   AST 12 (L) 15 - 41 U/L   ALT 13 0 - 44 U/L   Alkaline Phosphatase 71 38 - 126 U/L   Total Bilirubin 0.5 0.3 - 1.2 mg/dL   GFR, Estimated 11 (L) >60 mL/min    Comment: (NOTE) Calculated using the CKD-EPI Creatinine Equation (2021)    Anion gap 13 5 - 15    Comment: Performed at [facility], [address]  [place], [place] [address]  Troponin I (High Sensitivity)     Status: Abnormal   Collection Time: 05/15/22  2:49 PM  Result Value Ref Range   Troponin I (High Sensitivity) 62 (H) <  18 ng/L    Comment: (NOTE) Elevated high sensitivity troponin I (hsTnI) values and significant  changes across serial measurements may suggest ACS but many other  chronic and acute conditions are known to elevate hsTnI results.  Refer to the "Links" section for chest pain algorithms and additional  guidance. Performed at [facility], [address], [place], [place] [address]   Urinalysis, w/ Reflex to Culture (Infection Suspected) -Urine, Clean Catch     Status: Abnormal   Collection Time: 05/15/22  4:51 PM  Result Value Ref Range   Specimen Source URINE, CLEAN CATCH    Color, Urine YELLOW YELLOW   APPearance CLOUDY (A) CLEAR   Specific Gravity, Urine 1.009 1.005 - 1.030   pH 5.0 5.0 - 8.0   Glucose, UA NEGATIVE NEGATIVE mg/dL   Hgb urine dipstick NEGATIVE NEGATIVE   Bilirubin Urine NEGATIVE NEGATIVE   Ketones, ur 5 (A) NEGATIVE mg/dL   Protein, ur >=621 (A) NEGATIVE mg/dL   Nitrite NEGATIVE NEGATIVE   Leukocytes,Ua LARGE (A) NEGATIVE   RBC / HPF 6-10 0 - 5 RBC/hpf   WBC, UA >50 0 - 5 WBC/hpf    Comment:        Reflex urine culture not performed if WBC <=10, OR if Squamous epithelial cells >5. If Squamous epithelial cells >5 suggest recollection.    Bacteria, UA MANY (A) NONE SEEN   Squamous Epithelial / HPF 0-5 0 - 5 /HPF   WBC Clumps PRESENT     Comment: Performed at [facility], [address], [place], [place] [address]  Troponin I (High Sensitivity)     Status: Abnormal   Collection Time: 05/15/22  4:54 PM  Result Value Ref Range   Troponin I (High Sensitivity) 78 (H) <18 ng/L    Comment: (NOTE) Elevated high sensitivity troponin I (hsTnI) values and significant  changes across serial measurements may suggest ACS but many other  chronic and acute conditions are known to elevate  hsTnI results.  Refer to the "Links" section for chest pain algorithms and additional  guidance. Performed at [facility], [address], [place], [place] [address]    DG Chest 2 View  Result Date: 05/15/2022 CLINICAL DATA:  Shortness of breath. EXAM: CHEST - 2 VIEW COMPARISON:  Chest radiographs 05/04/2022 FINDINGS: The cardiac silhouette is borderline enlarged convex situated by AP technique and low lung volumes. No airspace consolidation, edema, pleural effusion, or pneumothorax is identified. No acute osseous abnormality is seen. IMPRESSION: No active cardiopulmonary disease. Electronically Signed   By: Sebastian Ache M.D.   On: 05/15/2022 13:25    Pending Labs Unresulted Labs (From admission, onward)     Start     Ordered   05/16/22 0500  Comprehensive metabolic panel  Tomorrow morning,   R        05/15/22 2011   05/16/22 0500  Magnesium  Tomorrow morning,   R        05/15/22 2011   05/16/22 0500  CBC with Differential/Platelet  Tomorrow morning,   R        05/15/22 2011   05/15/22 1909  Brain natriuretic peptide  Add-on,   AD        05/15/22 1908   05/15/22 1834  Urine Culture (for pregnant, neutropenic or urologic patients or patients with an indwelling urinary catheter)  (Urine Labs)  Add-on,   AD       Question:  Indication  Answer:  Dysuria  05/15/22 1833   05/15/22 1651  Urine Culture  Once,   R        05/15/22 1651            Vitals/Pain Today's Vitals   05/15/22 1715 05/15/22 1845 05/15/22 1930 05/15/22 2000  BP: (!) 169/90 (!) 171/99 (!) 174/88   Pulse: 89 90 96 93  Resp: 14 13 17 14   Temp:      TempSrc:      SpO2: 99% 100% 100% 100%  Weight:      Height:      PainSc:        Isolation Precautions No active isolations  Medications Medications  meropenem (MERREM) 500 mg in sodium chloride 0.9 % 100 mL IVPB (has no administration in time range)  furosemide (LASIX) tablet 80 mg (has no administration in time range)  hydrALAZINE (APRESOLINE)  tablet 100 mg (has no administration in time range)  metoCLOPramide (REGLAN) tablet 5 mg (has no administration in time range)  polyethylene glycol (MIRALAX / GLYCOLAX) packet 17 g (has no administration in time range)  sodium bicarbonate tablet 1,300 mg (has no administration in time range)  tamsulosin (FLOMAX) capsule 0.4 mg (has no administration in time range)  gabapentin (NEURONTIN) capsule 300 mg (has no administration in time range)  insulin aspart (novoLOG) injection 0-9 Units (has no administration in time range)  insulin aspart (novoLOG) injection 0-5 Units (has no administration in time range)  insulin glargine-yfgn (SEMGLEE) injection 5 Units (has no administration in time range)  heparin injection 5,000 Units (has no administration in time range)  acetaminophen (TYLENOL) tablet 650 mg (has no administration in time range)    Or  acetaminophen (TYLENOL) suppository 650 mg (has no administration in time range)  ondansetron (ZOFRAN) tablet 4 mg (has no administration in time range)    Or  ondansetron (ZOFRAN) injection 4 mg (has no administration in time range)  melatonin tablet 10 mg (has no administration in time range)  amLODipine (NORVASC) tablet 10 mg (has no administration in time range)  lisinopril (ZESTRIL) tablet 10 mg (has no administration in time range)  metoprolol succinate (TOPROL-XL) 24 hr tablet 12.5 mg (has no administration in time range)  rosuvastatin (CRESTOR) tablet 10 mg (has no administration in time range)  famotidine (PEPCID) tablet 10 mg (has no administration in time range)  finasteride (PROSCAR) tablet 5 mg (has no administration in time range)  clopidogrel (PLAVIX) tablet 75 mg (has no administration in time range)  furosemide (LASIX) injection 60 mg (60 mg Intravenous Given 05/15/22 1513)    Mobility walks with person assist     Focused Assessments Pulmonary Assessment Handoff:  Lung sounds: L Breath Sounds: Diminished R Breath Sounds:  Diminished O2 Device: Room Air      R Recommendations: See Admitting Provider Note  Report given to:   Additional Notes: Call or epic message for any additional questions

## 2022-05-15 NOTE — Assessment & Plan Note (Addendum)
Stable. Fluid management will transition to per hemodialysis.

## 2022-05-16 DIAGNOSIS — B9689 Other specified bacterial agents as the cause of diseases classified elsewhere: Secondary | ICD-10-CM | POA: Diagnosis not present

## 2022-05-16 DIAGNOSIS — N390 Urinary tract infection, site not specified: Secondary | ICD-10-CM | POA: Diagnosis not present

## 2022-05-16 LAB — COMPREHENSIVE METABOLIC PANEL
ALT: 11 U/L (ref 0–44)
AST: 13 U/L — ABNORMAL LOW (ref 15–41)
Albumin: 3 g/dL — ABNORMAL LOW (ref 3.5–5.0)
Alkaline Phosphatase: 60 U/L (ref 38–126)
Anion gap: 10 (ref 5–15)
BUN: 64 mg/dL — ABNORMAL HIGH (ref 8–23)
CO2: 21 mmol/L — ABNORMAL LOW (ref 22–32)
Calcium: 8.2 mg/dL — ABNORMAL LOW (ref 8.9–10.3)
Chloride: 102 mmol/L (ref 98–111)
Creatinine, Ser: 5.39 mg/dL — ABNORMAL HIGH (ref 0.61–1.24)
GFR, Estimated: 11 mL/min — ABNORMAL LOW (ref >60)
Glucose, Bld: 223 mg/dL — ABNORMAL HIGH (ref 70–99)
Potassium: 3.7 mmol/L (ref 3.5–5.1)
Sodium: 133 mmol/L — ABNORMAL LOW (ref 135–145)
Total Bilirubin: 0.5 mg/dL (ref 0.3–1.2)
Total Protein: 6.2 g/dL — ABNORMAL LOW (ref 6.5–8.1)

## 2022-05-16 LAB — CBC WITH DIFFERENTIAL/PLATELET
Abs Immature Granulocytes: 0.02 10*3/uL (ref 0.00–0.07)
Basophils Absolute: 0 10*3/uL (ref 0.0–0.1)
Basophils Relative: 0 %
Eosinophils Absolute: 0.1 10*3/uL (ref 0.0–0.5)
Eosinophils Relative: 2 %
HCT: 32 % — ABNORMAL LOW (ref 39.0–52.0)
Hemoglobin: 10.5 g/dL — ABNORMAL LOW (ref 13.0–17.0)
Immature Granulocytes: 0 %
Lymphocytes Relative: 16 %
Lymphs Abs: 1.1 10*3/uL (ref 0.7–4.0)
MCH: 29.4 pg (ref 26.0–34.0)
MCHC: 32.8 g/dL (ref 30.0–36.0)
MCV: 89.6 fL (ref 80.0–100.0)
Monocytes Absolute: 0.5 10*3/uL (ref 0.1–1.0)
Monocytes Relative: 8 %
Neutro Abs: 5 10*3/uL (ref 1.7–7.7)
Neutrophils Relative %: 74 %
Platelets: 274 10*3/uL (ref 150–400)
RBC: 3.57 MIL/uL — ABNORMAL LOW (ref 4.22–5.81)
RDW: 14.7 % (ref 11.5–15.5)
WBC: 6.9 10*3/uL (ref 4.0–10.5)
nRBC: 0 % (ref 0.0–0.2)

## 2022-05-16 LAB — GLUCOSE, CAPILLARY
Glucose-Capillary: 118 mg/dL — ABNORMAL HIGH (ref 70–99)
Glucose-Capillary: 160 mg/dL — ABNORMAL HIGH (ref 70–99)
Glucose-Capillary: 189 mg/dL — ABNORMAL HIGH (ref 70–99)
Glucose-Capillary: 126 mg/dL — ABNORMAL HIGH (ref 70–99)

## 2022-05-16 LAB — BRAIN NATRIURETIC PEPTIDE: B Natriuretic Peptide: 187.8 pg/mL — ABNORMAL HIGH (ref 0.0–100.0)

## 2022-05-16 LAB — MAGNESIUM: Magnesium: 1.8 mg/dL (ref 1.7–2.4)

## 2022-05-16 MED ORDER — HYDRALAZINE HCL 20 MG/ML IJ SOLN: 10.0000 mg | INTRAMUSCULAR | Status: DC | PRN

## 2022-05-16 MED ORDER — IPRATROPIUM-ALBUTEROL 0.5-2.5 (3) MG/3ML IN SOLN: 3.0000 mL | RESPIRATORY_TRACT | Status: DC | PRN

## 2022-05-16 MED ORDER — METOPROLOL TARTRATE 5 MG/5ML IV SOLN: 5.0000 mg | INTRAVENOUS | Status: DC | PRN

## 2022-05-16 MED ORDER — TRAZODONE HCL 50 MG PO TABS
50.0000 mg | ORAL_TABLET | Freq: Every evening | ORAL | Status: DC | PRN
  Administered 2022-05-16 – 2022-05-24 (×5): 50 mg via ORAL
  Filled 2022-05-16 (×5): qty 1

## 2022-05-16 MED ORDER — GUAIFENESIN 100 MG/5ML PO LIQD: 5.0000 mL | ORAL | Status: DC | PRN

## 2022-05-16 MED ORDER — SENNOSIDES-DOCUSATE SODIUM 8.6-50 MG PO TABS
1.0000 | ORAL_TABLET | Freq: Every evening | ORAL | Status: DC | PRN
  Administered 2022-05-19: 1 via ORAL
  Filled 2022-05-16 (×2): qty 1

## 2022-05-16 NOTE — Progress Notes (Signed)
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 coli and most recently ESBL Klebsiella, reflux, hyperlipidemia, anemia of chronic kidney disease presents the ER today with complaints of lower extremity edema and fluid retention for 2 weeks.  Patient was seen on 4/19 and diagnosed with UTI and sent home on Keflex but eventually cultures grew Klebsiella/ESBL.  Upon admission patient was started on meropenem.   Assessment & Plan:  Principal Problem:   Urinary tract infection due to ESBL Klebsiella Active Problems:   CKD (chronic kidney disease) stage 5, GFR less than 15 ml/min (HCC)   DM2 (diabetes mellitus, type 2) (HCC)   Essential hypertension   HLD (hyperlipidemia)   Gastroesophageal reflux disease without esophagitis   Anemia in chronic kidney disease (CKD)   Chronic diastolic heart failure (HCC)     Assessment and Plan: * Urinary tract infection due to ESBL Klebsiella Cultures from 4/19 grew ESBL, currently on meropenem.  Tentatively plan for total 5-day course supportive care.  CKD (chronic kidney disease) stage 5, GFR less than 15 ml/min (HCC) Follows with [facility]. Has been vascular surgery to schedule AVF vs AVG placement. Creatinine appears to be stable around 5.4.  Bicarb and potassium overall stable Daily sodium bicarb twice daily  Chronic diastolic heart failure (HCC) Stable. Euvolemic.  Previous echo December 2023 shows EF 55% with grade 2 DD  Anemia in chronic kidney disease (CKD) Chronic. Stable.  Hemoglobin 10.5  Gastroesophageal reflux disease without esophagitis Continue Pepcid  HLD (hyperlipidemia) Continue crestor 10 mg  Essential hypertension Stable.  Norvasc, Lasix, lisinopril, Toprol-XL.  IV as  needed  DM2 (diabetes mellitus, type 2) (HCC) Chronic.  Semglee 5 units at bedtime, sliding scale and Accu-Cheks  BPH - Flomax and Proscar   DVT prophylaxis: Subcu heparin Code Status: Full code Family Communication:   Status is: Inpatient Continue hospital stay for ESBL treatment for IV antibiotics       Diet Orders (From admission, onward)     Start     Ordered   05/15/22 2012  Diet renal/carb modified with fluid restriction Diet-HS Snack? Nothing; Fluid restriction: 2000 mL Fluid; Room service appropriate? Yes; Fluid consistency: Thin  Diet effective now       Question Answer Comment  Diet-HS Snack? Nothing   Fluid restriction: 2000 mL Fluid   Room service appropriate? Yes   Fluid consistency: Thin      05/15/22 2011            Subjective: Overall feels little better.  Still weak.   Examination:  General exam: Appears calm and comfortable  Respiratory system: Clear to auscultation. Respiratory effort normal. Cardiovascular system: S1 & S2 heard, RRR. No JVD, murmurs, rubs, gallops or clicks. No pedal edema. Gastrointestinal system: Abdomen is nondistended, soft and nontender. No organomegaly or masses felt. Normal bowel sounds heard. Central nervous system: Alert and oriented. No focal neurological deficits. Extremities: Symmetric 5 x 5 power. Skin: No rashes, lesions or ulcers Psychiatry: Judgement and insight appear normal. Mood & affect appropriate.  Objective: Vitals:   05/15/22 2000 05/15/22 2126 05/16/22 0126 05/16/22 0436  BP:  (!) 173/81 (!) 167/91 (!) 142/82  Pulse: 93 96 98 89  Resp: 14 17 18    Temp:  98 F (36.7 C) 98.1 F (36.7 C) 98.2  F (36.8 C)  TempSrc:  Oral Oral Oral  SpO2: 100% 100% 100% 100%  Weight:  96 kg    Height:  6\' 2"  (1.88 m)      Intake/Output Summary (Last 24 hours) at 05/16/2022 0806 Last data filed at 05/16/2022 0022 Gross per 24 hour  Intake --  Output 2114 ml  Net -2114 ml   Filed Weights   05/15/22 1243  05/15/22 2126  Weight: 97.1 kg 96 kg    Scheduled Meds:  amLODipine  10 mg Oral Daily   clopidogrel  75 mg Oral Daily   famotidine  10 mg Oral Daily   finasteride  5 mg Oral Daily   furosemide  80 mg Oral BID   gabapentin  300 mg Oral TID   heparin  5,000 Units Subcutaneous Q8H   hydrALAZINE  100 mg Oral TID   insulin aspart  0-5 Units Subcutaneous QHS   insulin aspart  0-9 Units Subcutaneous TID WC   insulin glargine-yfgn  5 Units Subcutaneous QHS   lisinopril  10 mg Oral Daily   metoCLOPramide  5 mg Oral TID AC & HS   metoprolol succinate  12.5 mg Oral Daily   polyethylene glycol  17 g Oral Daily   rosuvastatin  10 mg Oral Daily   sodium bicarbonate  1,300 mg Oral BID   tamsulosin  0.4 mg Oral QHS   Continuous Infusions:  meropenem (MERREM) IV 500 mg (05/16/22 0127)    Nutritional status     Body mass index is 27.17 kg/m.  Data Reviewed:   CBC: Recent Labs  Lab 05/15/22 1318 05/16/22 0032  WBC 5.3 6.9  NEUTROABS 3.2 5.0  HGB 11.2* 10.5*  HCT 34.3* 32.0*  MCV 90.5 89.6  PLT 268 274   Basic Metabolic Panel: Recent Labs  Lab 05/15/22 1318 05/16/22 0032  NA 136 133*  K 3.7 3.7  CL 103 102  CO2 20* 21*  GLUCOSE 187* 223*  BUN 67* 64*  CREATININE 5.40* 5.39*  CALCIUM 8.6* 8.2*  MG  --  1.8   GFR: Estimated Creatinine Clearance: 16.7 mL/min (A) (by C-G formula based on SCr of 5.39 mg/dL (H)). Liver Function Tests: Recent Labs  Lab 05/15/22 1318 05/16/22 0032  AST 12* 13*  ALT 13 11  ALKPHOS 71 60  BILITOT 0.5 0.5  PROT 6.9 6.2*  ALBUMIN 3.4* 3.0*   No results for input(s): "LIPASE", "AMYLASE" in the last 168 hours. No results for input(s): "AMMONIA" in the last 168 hours. Coagulation Profile: No results for input(s): "INR", "PROTIME" in the last 168 hours. Cardiac Enzymes: No results for input(s): "CKTOTAL", "CKMB", "CKMBINDEX", "TROPONINI" in the last 168 hours. BNP (last 3 results) No results for input(s): "PROBNP" in the last 8760  hours. HbA1C: No results for input(s): "HGBA1C" in the last 72 hours. CBG: Recent Labs  Lab 05/15/22 2216 05/16/22 0732  GLUCAP 102* 118*   Lipid Profile: No results for input(s): "CHOL", "HDL", "LDLCALC", "TRIG", "CHOLHDL", "LDLDIRECT" in the last 72 hours. Thyroid Function Tests: No results for input(s): "TSH", "T4TOTAL", "FREET4", "T3FREE", "THYROIDAB" in the last 72 hours. Anemia Panel: No results for input(s): "VITAMINB12", "FOLATE", "FERRITIN", "TIBC", "IRON", "RETICCTPCT" in the last 72 hours. Sepsis Labs: No results for input(s): "PROCALCITON", "LATICACIDVEN" in the last 168 hours.  Recent Results (from the past 240 hour(s))  Urine Culture     Status: None (Preliminary result)   Collection Time: 05/15/22  4:51 PM   Specimen: Urine, Clean Catch  Result Value  Ref Range Status   Specimen Description URINE, CLEAN CATCH  Final   Special Requests   Final    NONE Reflexed from [id number] Performed at [facility], [address], [place], [place] [address]    Culture PENDING  Incomplete   Report Status PENDING  Incomplete         Radiology Studies: DG Chest 2 View  Result Date: 05/15/2022 CLINICAL DATA:  Shortness of breath. EXAM: CHEST - 2 VIEW COMPARISON:  Chest radiographs 05/04/2022 FINDINGS: The cardiac silhouette is borderline enlarged convex situated by AP technique and low lung volumes. No airspace consolidation, edema, pleural effusion, or pneumothorax is identified. No acute osseous abnormality is seen. IMPRESSION: No active cardiopulmonary disease. Electronically Signed   By: Sebastian Ache M.D.   On: 05/15/2022 13:25           LOS: 1 day   Time spent= 35 mins    Renan Danese Joline Maxcy, MD [facility]  If 7PM-7AM, please contact night-coverage  05/16/2022, 8:06 AM

## 2022-05-17 DIAGNOSIS — N390 Urinary tract infection, site not specified: Secondary | ICD-10-CM | POA: Diagnosis not present

## 2022-05-17 DIAGNOSIS — B9689 Other specified bacterial agents as the cause of diseases classified elsewhere: Secondary | ICD-10-CM | POA: Diagnosis not present

## 2022-05-17 LAB — CBC
HCT: 28.7 % — ABNORMAL LOW (ref 39.0–52.0)
Hemoglobin: 9.7 g/dL — ABNORMAL LOW (ref 13.0–17.0)
MCH: 29.6 pg (ref 26.0–34.0)
MCHC: 33.8 g/dL (ref 30.0–36.0)
MCV: 87.5 fL (ref 80.0–100.0)
Platelets: 250 10*3/uL (ref 150–400)
RBC: 3.28 MIL/uL — ABNORMAL LOW (ref 4.22–5.81)
RDW: 15.1 % (ref 11.5–15.5)
WBC: 4.6 10*3/uL (ref 4.0–10.5)
nRBC: 0 % (ref 0.0–0.2)

## 2022-05-17 LAB — BASIC METABOLIC PANEL
Anion gap: 9 (ref 5–15)
BUN: 76 mg/dL — ABNORMAL HIGH (ref 8–23)
CO2: 21 mmol/L — ABNORMAL LOW (ref 22–32)
Calcium: 8.1 mg/dL — ABNORMAL LOW (ref 8.9–10.3)
Chloride: 105 mmol/L (ref 98–111)
Creatinine, Ser: 6.02 mg/dL — ABNORMAL HIGH (ref 0.61–1.24)
GFR, Estimated: 10 mL/min — ABNORMAL LOW (ref >60)
Glucose, Bld: 166 mg/dL — ABNORMAL HIGH (ref 70–99)
Potassium: 4.2 mmol/L (ref 3.5–5.1)
Sodium: 135 mmol/L (ref 135–145)

## 2022-05-17 LAB — GLUCOSE, CAPILLARY
Glucose-Capillary: 128 mg/dL — ABNORMAL HIGH (ref 70–99)
Glucose-Capillary: 132 mg/dL — ABNORMAL HIGH (ref 70–99)
Glucose-Capillary: 107 mg/dL — ABNORMAL HIGH (ref 70–99)
Glucose-Capillary: 221 mg/dL — ABNORMAL HIGH (ref 70–99)

## 2022-05-17 LAB — MAGNESIUM: Magnesium: 1.8 mg/dL (ref 1.7–2.4)

## 2022-05-17 LAB — URINE CULTURE: Culture: >=100000 — AB

## 2022-05-17 NOTE — Plan of Care (Signed)
  Problem: Education: Goal: Ability to describe self-care measures that may prevent or decrease complications (Diabetes Survival Skills Education) will improve Outcome: Progressing Goal: Individualized Educational Video(s) Outcome: Progressing   Problem: Coping: Goal: Ability to adjust to condition or change in health will improve Outcome: Progressing   Problem: Fluid Volume: Goal: Ability to maintain a balanced intake and output will improve Outcome: Progressing   Problem: Health Behavior/Discharge Planning: Goal: Ability to identify and utilize available resources and services will improve Outcome: Progressing Goal: Ability to manage health-related needs will improve Outcome: Progressing   Problem: Metabolic: Goal: Ability to maintain appropriate glucose levels will improve Outcome: Progressing   Problem: Nutritional: Goal: Maintenance of adequate nutrition will improve Outcome: Progressing Goal: Progress toward achieving an optimal weight will improve Outcome: Progressing   Problem: Skin Integrity: Goal: Risk for impaired skin integrity will decrease Outcome: Progressing   Problem: Tissue Perfusion: Goal: Adequacy of tissue perfusion will improve Outcome: Progressing   Problem: Education: Goal: Knowledge of General Education information will improve Description: Including pain rating scale, medication(s)/side effects and non-pharmacologic comfort measures Outcome: Progressing   Problem: Health Behavior/Discharge Planning: Goal: Ability to manage health-related needs will improve Outcome: Progressing   Problem: Clinical Measurements: Goal: Ability to maintain clinical measurements within normal limits will improve Outcome: Progressing Goal: Will remain free from infection Outcome: Progressing Goal: Diagnostic test results will improve Outcome: Progressing Goal: Respiratory complications will improve Outcome: Progressing Goal: Cardiovascular complication will  be avoided Outcome: Progressing   Problem: Activity: Goal: Risk for activity intolerance will decrease Outcome: Progressing   Problem: Nutrition: Goal: Adequate nutrition will be maintained Outcome: Progressing   Problem: Coping: Goal: Level of anxiety will decrease Outcome: Progressing   Problem: Elimination: Goal: Will not experience complications related to bowel motility Outcome: Progressing Goal: Will not experience complications related to urinary retention Outcome: Progressing   Problem: Pain Managment: Goal: General experience of comfort will improve Outcome: Progressing   Problem: Safety: Goal: Ability to remain free from injury will improve Outcome: Progressing   Problem: Skin Integrity: Goal: Risk for impaired skin integrity will decrease Outcome: Progressing   Problem: Urinary Elimination: Goal: Signs and symptoms of infection will decrease Outcome: Progressing   

## 2022-05-17 NOTE — Progress Notes (Signed)
PROGRESS NOTE    Ryan Jenkins  ZOX:[id number] DOB: 05/05/1960 DOA: 05/15/2022 PCP: Malka So., MD   Brief Narrative:  62 year old African-American male history of CKD stage V, type 2 diabetes, hypertension, history of multidrug-resistant urinary tract infections including ESBL E. coli and most recently ESBL Klebsiella, reflux, hyperlipidemia, anemia of chronic kidney disease presents the ER today with complaints of lower extremity edema and fluid retention for 2 weeks.  Patient was seen on 4/19 and diagnosed with UTI and sent home on Keflex but eventually cultures grew Klebsiella/ESBL.  Upon admission patient was started on meropenem.  05/17/2022: Patient seen.  No new complaints.  Will complete course of IV antibiotics.   Assessment & Plan:  Principal Problem:   Urinary tract infection due to ESBL Klebsiella Active Problems:   DM2 (diabetes mellitus, type 2) (HCC)   Essential hypertension   HLD (hyperlipidemia)   Gastroesophageal reflux disease without esophagitis   Anemia in chronic kidney disease (CKD)   Chronic diastolic heart failure (HCC)   CKD (chronic kidney disease) stage 5, GFR less than 15 ml/min (HCC)     Assessment and Plan: * Urinary tract infection due to ESBL Klebsiella Cultures from 4/19 grew ESBL, currently on meropenem.  Tentatively plan for total 5-day course supportive care. 05/17/2022: Complete course of antibiotics.  CKD (chronic kidney disease) stage 5, GFR less than 15 ml/min (HCC) Follows with [facility]. Has been vascular surgery to schedule AVF vs AVG placement. Creatinine appears to be stable around 5.4.  Bicarb and potassium overall stable Daily sodium bicarb twice daily 05/17/2022: Continue to monitor renal function and electrolytes.  Avoid nephrotoxins.  Keep MAP greater than 65 mmHg.  Chronic diastolic heart failure (HCC) Stable. Euvolemic.  Previous echo December 2023 shows EF 55% with grade 2 DD 05/17/2022: Compensated.  Anemia in  chronic kidney disease (CKD) Chronic. Stable.  Hemoglobin 10.5 05/17/2022: Hemoglobin is at goal.  Gastroesophageal reflux disease without esophagitis Continue Pepcid  HLD (hyperlipidemia) Continue crestor 10 mg  Essential hypertension Stable.  Norvasc, Lasix, lisinopril, Toprol-XL.  IV as needed. 05/17/2022: Goal blood pressure should be less than 130/80 mmHg.  DM2 (diabetes mellitus, type 2) (HCC) Chronic.  Semglee 5 units at bedtime, sliding scale and Accu-Cheks  BPH - Flomax and Proscar   DVT prophylaxis: Subcu heparin Code Status: Full code Family Communication:   Status is: Inpatient Continue hospital stay for ESBL treatment for IV antibiotics       Diet Orders (From admission, onward)     Start     Ordered   05/15/22 2012  Diet renal/carb modified with fluid restriction Diet-HS Snack? Nothing; Fluid restriction: 2000 mL Fluid; Room service appropriate? Yes; Fluid consistency: Thin  Diet effective now       Question Answer Comment  Diet-HS Snack? Nothing   Fluid restriction: 2000 mL Fluid   Room service appropriate? Yes   Fluid consistency: Thin      05/15/22 2011            Subjective: No new complaints. No fever or chills No chest pain. No dysuria.     Examination: General exam: Appears calm and comfortable  Respiratory system: Clear to auscultation.  Cardiovascular system: S1 & S2, soft systolic murmur with increased intensity of S2 component of the heart sound.   Gastrointestinal system: Abdomen is soft and nontender.   Central nervous system: Alert and oriented.  Patient moves all extremities.   Extremities: No leg edema.  Objective: Vitals:   05/16/22 1614  05/16/22 2213 05/17/22 0545 05/17/22 0843  BP: 136/72 (!) 144/72 (!) 152/81 124/63  Pulse: 88 88 90 81  Resp: 18 18 18 16   Temp: 98.5 F (36.9 C) 98.5 F (36.9 C) 98 F (36.7 C) 98.4 F (36.9 C)  TempSrc:  Oral Oral Oral  SpO2: 100% 99% 98% 100%  Weight:      Height:         Intake/Output Summary (Last 24 hours) at 05/17/2022 1452 Last data filed at 05/17/2022 1232 Gross per 24 hour  Intake 700 ml  Output 1350 ml  Net -650 ml    Filed Weights   05/15/22 1243 05/15/22 2126  Weight: 97.1 kg 96 kg    Scheduled Meds:  amLODipine  10 mg Oral Daily   clopidogrel  75 mg Oral Daily   famotidine  10 mg Oral Daily   finasteride  5 mg Oral Daily   furosemide  80 mg Oral BID   gabapentin  300 mg Oral TID   heparin  5,000 Units Subcutaneous Q8H   hydrALAZINE  100 mg Oral TID   insulin aspart  0-5 Units Subcutaneous QHS   insulin aspart  0-9 Units Subcutaneous TID WC   insulin glargine-yfgn  5 Units Subcutaneous QHS   lisinopril  10 mg Oral Daily   metoCLOPramide  5 mg Oral TID AC & HS   metoprolol succinate  12.5 mg Oral Daily   polyethylene glycol  17 g Oral Daily   rosuvastatin  10 mg Oral Daily   sodium bicarbonate  1,300 mg Oral BID   tamsulosin  0.4 mg Oral QHS   Continuous Infusions:  meropenem (MERREM) IV 500 mg (05/17/22 1000)    Nutritional status     Body mass index is 27.17 kg/m.  Data Reviewed:   CBC: Recent Labs  Lab 05/15/22 1318 05/16/22 0032 05/17/22 0412  WBC 5.3 6.9 4.6  NEUTROABS 3.2 5.0  --   HGB 11.2* 10.5* 9.7*  HCT 34.3* 32.0* 28.7*  MCV 90.5 89.6 87.5  PLT 268 274 250    Basic Metabolic Panel: Recent Labs  Lab 05/15/22 1318 05/16/22 0032 05/17/22 0412  NA 136 133* 135  K 3.7 3.7 4.2  CL 103 102 105  CO2 20* 21* 21*  GLUCOSE 187* 223* 166*  BUN 67* 64* 76*  CREATININE 5.40* 5.39* 6.02*  CALCIUM 8.6* 8.2* 8.1*  MG  --  1.8 1.8    GFR: Estimated Creatinine Clearance: 15 mL/min (A) (by C-G formula based on SCr of 6.02 mg/dL (H)). Liver Function Tests: Recent Labs  Lab 05/15/22 1318 05/16/22 0032  AST 12* 13*  ALT 13 11  ALKPHOS 71 60  BILITOT 0.5 0.5  PROT 6.9 6.2*  ALBUMIN 3.4* 3.0*    No results for input(s): "LIPASE", "AMYLASE" in the last 168 hours. No results for input(s): "AMMONIA"  in the last 168 hours. Coagulation Profile: No results for input(s): "INR", "PROTIME" in the last 168 hours. Cardiac Enzymes: No results for input(s): "CKTOTAL", "CKMB", "CKMBINDEX", "TROPONINI" in the last 168 hours. BNP (last 3 results) No results for input(s): "PROBNP" in the last 8760 hours. HbA1C: No results for input(s): "HGBA1C" in the last 72 hours. CBG: Recent Labs  Lab 05/16/22 1112 05/16/22 1612 05/16/22 2214 05/17/22 0719 05/17/22 1121  GLUCAP 160* 189* 126* 128* 132*    Lipid Profile: No results for input(s): "CHOL", "HDL", "LDLCALC", "TRIG", "CHOLHDL", "LDLDIRECT" in the last 72 hours. Thyroid Function Tests: No results for input(s): "TSH", "T4TOTAL", "FREET4", "  T3FREE", "THYROIDAB" in the last 72 hours. Anemia Panel: No results for input(s): "VITAMINB12", "FOLATE", "FERRITIN", "TIBC", "IRON", "RETICCTPCT" in the last 72 hours. Sepsis Labs: No results for input(s): "PROCALCITON", "LATICACIDVEN" in the last 168 hours.  Recent Results (from the past 240 hour(s))  Urine Culture     Status: Abnormal (Preliminary result)   Collection Time: 05/15/22  4:51 PM   Specimen: Urine, Clean Catch  Result Value Ref Range Status   Specimen Description URINE, CLEAN CATCH  Final   Special Requests NONE Reflexed from [id number]  Final   Culture (A)  Final    >=100,000 COLONIES/mL KLEBSIELLA PNEUMONIAE REPEATING SUSCEPTIBILITIES FOR CONFIRMATION Performed at [facility], [address], [place], [place] [address]    Report Status PENDING  Incomplete         Radiology Studies: No results found.         LOS: 2 days   Time spent= 35 mins    Barnetta Chapel, MD [facility]  If 7PM-7AM, please contact night-coverage  05/17/2022, 2:52 PM

## 2022-05-17 NOTE — Progress Notes (Signed)
Mobility Specialist Progress Note   05/17/22 1433  Mobility  Activity Ambulated with assistance in hallway;Transferred from chair to bed  Level of Assistance Contact guard assist, steadying assist  Assistive Device Front wheel walker  Distance Ambulated (ft) 204 ft  Activity Response Tolerated well  Mobility Referral Yes  $Mobility charge 1 Mobility   Received pt in chair requesting to go back to bed but agreeable to hallway ambulation. X2 attempts for pt to stand but able to w/ just minG. Ambulated w/ steady gait but pt did expressing uneasy feeling in L knee. Returned back to bed w/o fault, left w/ call bell in reach and bed alarm on.     Frederico Hamman Mobility Specialist Please contact via SecureChat or  Rehab office at [phone]

## 2022-05-18 ENCOUNTER — Inpatient Hospital Stay (HOSPITAL_COMMUNITY): Payer: Medicaid Other

## 2022-05-18 DIAGNOSIS — N390 Urinary tract infection, site not specified: Secondary | ICD-10-CM | POA: Diagnosis not present

## 2022-05-18 DIAGNOSIS — B9689 Other specified bacterial agents as the cause of diseases classified elsewhere: Secondary | ICD-10-CM | POA: Diagnosis not present

## 2022-05-18 LAB — CARBAPENEM RESISTANCE PANEL
Carba Resistance IMP Gene: NOT DETECTED
Carba Resistance KPC Gene: NOT DETECTED
Carba Resistance NDM Gene: NOT DETECTED
Carba Resistance OXA48 Gene: NOT DETECTED
Carba Resistance VIM Gene: NOT DETECTED

## 2022-05-18 LAB — GLUCOSE, CAPILLARY
Glucose-Capillary: 166 mg/dL — ABNORMAL HIGH (ref 70–99)
Glucose-Capillary: 103 mg/dL — ABNORMAL HIGH (ref 70–99)
Glucose-Capillary: 175 mg/dL — ABNORMAL HIGH (ref 70–99)
Glucose-Capillary: 135 mg/dL — ABNORMAL HIGH (ref 70–99)

## 2022-05-18 LAB — URINE CULTURE

## 2022-05-18 MED ORDER — MENTHOL 3 MG MT LOZG
1.0000 | LOZENGE | OROMUCOSAL | Status: DC | PRN
  Administered 2022-05-18: 3 mg via ORAL
  Filled 2022-05-18 (×2): qty 9

## 2022-05-18 NOTE — Progress Notes (Signed)
PROGRESS NOTE    Ryan Jenkins  WUJ:[id number] DOB: [birth year] DOA: 05/15/2022 PCP: Malka So., MD   Brief Narrative:  62 year old African-American male history of CKD stage V, type 2 diabetes, hypertension, history of multidrug-resistant urinary tract infections including ESBL E. coli and most recently ESBL Klebsiella, reflux, hyperlipidemia, anemia of chronic kidney disease presents the ER today with complaints of lower extremity edema and fluid retention for 2 weeks.  Patient was seen on 4/19 and diagnosed with UTI and sent home on Keflex but eventually cultures grew Klebsiella/ESBL.  Upon admission patient was started on meropenem.  05/17/2022: Patient seen.  No new complaints.  Will complete course of IV antibiotics. 324: Patient seen.  Patient reports sore throat and left hip/thigh pain.  No trauma.  No fever or chills.  Complete course of IV antibiotics.  Throat swab for culture.  X-ray of the left hip.  Cepacol as needed.   Assessment & Plan:  Principal Problem:   Urinary tract infection due to ESBL Klebsiella Active Problems:   DM2 (diabetes mellitus, type 2) (HCC)   Essential hypertension   HLD (hyperlipidemia)   Gastroesophageal reflux disease without esophagitis   Anemia in chronic kidney disease (CKD)   Chronic diastolic heart failure (HCC)   CKD (chronic kidney disease) stage 5, GFR less than 15 ml/min (HCC)     Assessment and Plan: * Urinary tract infection due to ESBL Klebsiella Cultures from 4/19 grew ESBL, currently on meropenem.  Tentatively plan for total 5-day course supportive care. 05/18/2022: Complete course of antibiotics.  CKD (chronic kidney disease) stage 5, GFR less than 15 ml/min (HCC) Follows with [facility]. Has been vascular surgery to schedule AVF vs AVG placement. Creatinine appears to be stable around 5.4.  Bicarb and potassium overall stable Daily sodium bicarb twice daily 05/17/2022: Continue to monitor renal function and electrolytes.   Avoid nephrotoxins.  Keep MAP greater than 65 mmHg. 05/18/2022: No new labs today.  Chronic diastolic heart failure (HCC) Stable. Euvolemic.  Previous echo December 2023 shows EF 55% with grade 2 DD 05/17/2022: Compensated.  Anemia in chronic kidney disease (CKD) Chronic. Stable.  Hemoglobin 10.5 05/17/2022: Hemoglobin is at goal.  Gastroesophageal reflux disease without esophagitis Continue Pepcid  HLD (hyperlipidemia) Continue crestor 10 mg  Essential hypertension Stable.  Norvasc, Lasix, lisinopril, Toprol-XL.  IV as needed. 05/17/2022: Goal blood pressure should be less than 130/80 mmHg.  DM2 (diabetes mellitus, type 2) (HCC) Chronic.  Semglee 5 units at bedtime, sliding scale and Accu-Cheks  BPH - Flomax and Proscar  Sore throat: -Throat swab. -Cepacol as needed  Left hip/thigh pain: -X-ray. -Adequate analgesia.   DVT prophylaxis: Subcu heparin Code Status: Full code Family Communication:   Status is: Inpatient Continue hospital stay for ESBL treatment for IV antibiotics       Diet Orders (From admission, onward)     Start     Ordered   05/15/22 2012  Diet renal/carb modified with fluid restriction Diet-HS Snack? Nothing; Fluid restriction: 2000 mL Fluid; Room service appropriate? Yes; Fluid consistency: Thin  Diet effective now       Question Answer Comment  Diet-HS Snack? Nothing   Fluid restriction: 2000 mL Fluid   Room service appropriate? Yes   Fluid consistency: Thin      05/15/22 2011            Subjective: Patient reports sore throat.   Patient reported left thigh pain.    Examination: General exam: Appears calm and comfortable  Respiratory system: Clear to auscultation.  Cardiovascular system: S1 & S2, soft systolic murmur with increased intensity of S2 component of the heart sound.   Gastrointestinal system: Abdomen is soft and nontender.   Central nervous system: Alert and oriented.  Patient moves all extremities.   Extremities: No  leg edema.  Objective: Vitals:   05/17/22 0843 05/17/22 1602 05/17/22 2052 05/18/22 0515  BP: 124/63 (!) 142/84 135/68 137/72  Pulse: 81 85 90 88  Resp: 16 16 18 18   Temp: 98.4 F (36.9 C) 98 F (36.7 C) 97.9 F (36.6 C) 97.9 F (36.6 C)  TempSrc: Oral Oral Oral Oral  SpO2: 100% 100% 99% 98%  Weight:      Height:        Intake/Output Summary (Last 24 hours) at 05/18/2022 0918 Last data filed at 05/17/2022 2105 Gross per 24 hour  Intake 580 ml  Output 700 ml  Net -120 ml    Filed Weights   05/15/22 1243 05/15/22 2126  Weight: 97.1 kg 96 kg    Scheduled Meds:  amLODipine  10 mg Oral Daily   clopidogrel  75 mg Oral Daily   famotidine  10 mg Oral Daily   finasteride  5 mg Oral Daily   furosemide  80 mg Oral BID   gabapentin  300 mg Oral TID   heparin  5,000 Units Subcutaneous Q8H   hydrALAZINE  100 mg Oral TID   insulin aspart  0-5 Units Subcutaneous QHS   insulin aspart  0-9 Units Subcutaneous TID WC   insulin glargine-yfgn  5 Units Subcutaneous QHS   lisinopril  10 mg Oral Daily   metoCLOPramide  5 mg Oral TID AC & HS   metoprolol succinate  12.5 mg Oral Daily   polyethylene glycol  17 g Oral Daily   rosuvastatin  10 mg Oral Daily   sodium bicarbonate  1,300 mg Oral BID   tamsulosin  0.4 mg Oral QHS   Continuous Infusions:  meropenem (MERREM) IV 500 mg (05/17/22 2105)    Nutritional status     Body mass index is 27.17 kg/m.  Data Reviewed:   CBC: Recent Labs  Lab 05/15/22 1318 05/16/22 0032 05/17/22 0412  WBC 5.3 6.9 4.6  NEUTROABS 3.2 5.0  --   HGB 11.2* 10.5* 9.7*  HCT 34.3* 32.0* 28.7*  MCV 90.5 89.6 87.5  PLT 268 274 250    Basic Metabolic Panel: Recent Labs  Lab 05/15/22 1318 05/16/22 0032 05/17/22 0412  NA 136 133* 135  K 3.7 3.7 4.2  CL 103 102 105  CO2 20* 21* 21*  GLUCOSE 187* 223* 166*  BUN 67* 64* 76*  CREATININE 5.40* 5.39* 6.02*  CALCIUM 8.6* 8.2* 8.1*  MG  --  1.8 1.8    GFR: Estimated Creatinine Clearance: 15  mL/min (A) (by C-G formula based on SCr of 6.02 mg/dL (H)). Liver Function Tests: Recent Labs  Lab 05/15/22 1318 05/16/22 0032  AST 12* 13*  ALT 13 11  ALKPHOS 71 60  BILITOT 0.5 0.5  PROT 6.9 6.2*  ALBUMIN 3.4* 3.0*    No results for input(s): "LIPASE", "AMYLASE" in the last 168 hours. No results for input(s): "AMMONIA" in the last 168 hours. Coagulation Profile: No results for input(s): "INR", "PROTIME" in the last 168 hours. Cardiac Enzymes: No results for input(s): "CKTOTAL", "CKMB", "CKMBINDEX", "TROPONINI" in the last 168 hours. BNP (last 3 results) No results for input(s): "PROBNP" in the last 8760 hours. HbA1C: No results for input(s): "  HGBA1C" in the last 72 hours. CBG: Recent Labs  Lab 05/17/22 0719 05/17/22 1121 05/17/22 1604 05/17/22 2054 05/18/22 0747  GLUCAP 128* 132* 107* 221* 166*    Lipid Profile: No results for input(s): "CHOL", "HDL", "LDLCALC", "TRIG", "CHOLHDL", "LDLDIRECT" in the last 72 hours. Thyroid Function Tests: No results for input(s): "TSH", "T4TOTAL", "FREET4", "T3FREE", "THYROIDAB" in the last 72 hours. Anemia Panel: No results for input(s): "VITAMINB12", "FOLATE", "FERRITIN", "TIBC", "IRON", "RETICCTPCT" in the last 72 hours. Sepsis Labs: No results for input(s): "PROCALCITON", "LATICACIDVEN" in the last 168 hours.  Recent Results (from the past 240 hour(s))  Urine Culture     Status: Abnormal (Preliminary result)   Collection Time: 05/15/22  4:51 PM   Specimen: Urine, Clean Catch  Result Value Ref Range Status   Specimen Description URINE, CLEAN CATCH  Final   Special Requests NONE Reflexed from [id number]  Final   Culture (A)  Final    >=100,000 COLONIES/mL KLEBSIELLA PNEUMONIAE REPEATING SUSCEPTIBILITIES FOR CONFIRMATION Performed at [facility], [address] [place], [place] [address]    Report Status PENDING  Incomplete         Radiology Studies: No results found.         LOS: 3 days   Time spent= 35  mins    Barnetta Chapel, MD [facility]  If 7PM-7AM, please contact night-coverage  05/18/2022, 9:18 AM

## 2022-05-18 NOTE — TOC Initial Note (Signed)
Transition of Care [facility]) - Initial/Assessment Note    Patient Details  Name: Ryan Jenkins MRN: [id number] Date of Birth: 24-Jul-1960  [facility] CM/SW Contact:    Ryan Jenkins, Ryan Coria, RN Phone Number: 05/18/2022, 9:15 AM  Clinical Narrative:                  CM spoke with patient at bedside about needs for post hospital transition. Admitted for UTI 2/2 ESBL Klebsiella, on IV abx. Patient has a hx of CKD V. Followed by [facility]. From home with brother, Ryan Jenkins. Has one daughter who lives in [place]. Not employed, on disability. Does not drive, [employer] and also uses Medicaid transportation.  Has a RW, Rollator and W/C at home. PCP is Jobe, [place]., MD and uses [facility] pharmacy on [address]. Patient is active with [facility] for Home health PT/RN disciplines. No PT/OT recommendations noted at this time.  CM will continue to follow as patient progresses with care towards discharge.            Expected Discharge Plan: Home/Self Care Barriers to Discharge: Continued Medical Work up   Patient Goals and CMS Choice Patient states their goals for this hospitalization and ongoing recovery are:: To return home CMS Medicare.gov Compare Post Acute Care list provided to:: Patient Choice offered to / list presented to : NA      Expected Discharge Plan and Services   Discharge Planning Services: CM Consult Post Acute Care Choice: NA Living arrangements for the past 2 months: Apartment                 DME Arranged: N/A DME Agency: NA       HH Arranged: PT, RN (Active with [facility].) HH Agency: [facility] Date [facility] Agency Contacted: 05/16/22 Time HH Agency Contacted: 1415 Representative spoke with at [facility] Agency: Brandi  Prior Living Arrangements/Services Living arrangements for the past 2 months: Apartment Lives with:: Siblings (Brother, Emergency planning/management officer.) Patient language and need for interpreter reviewed:: Yes Do you  feel safe going back to the place where you live?: Yes      Need for Family Participation in Patient Care: Yes (Comment) Care giver support system in place?: Yes (comment) Current home services: DME, Home PT, Sitter (RW, Rollator, w/c) Criminal Activity/Legal Involvement Pertinent to Current Situation/Hospitalization: No - Comment as needed  Activities of Daily Living Home Assistive Devices/Equipment: Environmental consultant (specify type) (rollator) ADL Screening (condition at time of admission) Patient's cognitive ability adequate to safely complete daily activities?: Yes Is the patient deaf or have difficulty hearing?: No Does the patient have difficulty seeing, even when wearing glasses/contacts?: No Does the patient have difficulty concentrating, remembering, or making decisions?: No Patient able to express need for assistance with ADLs?: Yes Does the patient have difficulty dressing or bathing?: No Independently performs ADLs?: No Communication: Independent Dressing (OT): Independent Grooming: Independent Feeding: Independent Bathing: Independent Toileting: Independent In/Out Bed: Independent with device (comment) Walks in Home: Independent with device (comment) Does the patient have difficulty walking or climbing stairs?: Yes Weakness of Legs: None Weakness of Arms/Hands: None  Permission Sought/Granted Permission sought to share information with : Case Manager, Family Supports, Oceanographer granted to share information with : Yes, Verbal Permission Granted              Emotional Assessment Appearance:: Appears stated age Attitude/Demeanor/Rapport: Gracious Affect (typically observed): Accepting, Appropriate, Calm, Hopeful, Pleasant Orientation: : Oriented to Self, Oriented to Place, Oriented to  Time, Oriented to Situation Alcohol / Substance Use: Not Applicable Psych Involvement: No (comment)  Admission diagnosis:  Elevated troponin [R79.89] Urinary  tract infection due to ESBL Klebsiella [N39.0, B96.89] Urinary tract infection without hematuria, site unspecified [N39.0] Dyspnea, unspecified type [R06.00] Patient Active Problem List   Diagnosis Date Noted   Urinary tract infection due to ESBL Klebsiella 05/15/2022   CKD (chronic kidney disease) stage 5, GFR less than 15 ml/min (HCC) 04/24/2022   Diabetic gastroparesis (HCC) 04/09/2022   Chronic diastolic heart failure (HCC) 04/04/2022   Anemia in chronic kidney disease (CKD) 10/18/2021   BPH (benign prostatic hyperplasia) 10/16/2021   Coronary artery disease 02/07/2021   Erectile dysfunction associated with type 2 diabetes mellitus (HCC) 08/07/2019   Vitreous hemorrhage of left eye (HCC) 07/22/2019   Vision loss of left eye 07/22/2019   History of medication noncompliance 04/02/2019   Gastroesophageal reflux disease without esophagitis 09/04/2018   Diabetic retinopathy of both eyes associated with type 2 diabetes mellitus (HCC) 01/03/2018   Intermittent diarrhea 09/27/2017   Gastroparesis 09/27/2017   Macroalbuminuric diabetic nephropathy (HCC) 06/25/2017   History of falling 06/25/2017   HLD (hyperlipidemia) 03/21/2017   Vitamin B 12 deficiency 03/21/2017   DM2 (diabetes mellitus, type 2) (HCC) 02/05/2017   Essential hypertension 02/05/2017   Depression 02/05/2017   Unintended weight loss 02/05/2017   Gait disturbance 02/05/2017   Pronation deformity of both feet 05/11/2014   Diabetic neuropathy, type II diabetes mellitus (HCC) 05/11/2014   Metatarsal deformity 05/11/2014   PCP:  Ryan So., MD Pharmacy:   [facility] [phone] - [place], [place] - [address] AT [address] [place] [address] Phone: [phone] Fax: [phone]     Social Determinants of Health (SDOH) Social History: SDOH Screenings   Food Insecurity: No Food Insecurity (05/15/2022)  Housing: Low Risk  (05/15/2022)  Transportation Needs: No Transportation Needs  (05/15/2022)  Utilities: Not At Risk (05/15/2022)  Depression (PHQ2-9): Medium Risk (08/07/2019)  Tobacco Use: Low Risk  (05/15/2022)   SDOH Interventions: Transportation Interventions: Intervention Not Indicated, Inpatient TOC, Patient Resources (Friends/Family)   Readmission Risk Interventions    05/18/2022    9:11 AM 04/05/2022   11:10 AM 03/26/2022    5:46 PM  Readmission Risk Prevention Plan  Transportation Screening Complete Complete Complete  Medication Review (RN Care Manager) Referral to Pharmacy Complete Referral to Pharmacy  PCP or Specialist appointment within 3-5 days of discharge Complete Complete   HRI or Home Care Consult Complete Complete Complete  SW Recovery Care/Counseling Consult Complete Complete Complete  Palliative Care Screening Not Applicable Not Applicable Not Applicable  Skilled Nursing Facility Not Applicable Not Applicable Not Applicable

## 2022-05-18 NOTE — Plan of Care (Signed)
  Problem: Education: Goal: Ability to describe self-care measures that may prevent or decrease complications (Diabetes Survival Skills Education) will improve Outcome: Progressing Goal: Individualized Educational Video(s) Outcome: Progressing   Problem: Coping: Goal: Ability to adjust to condition or change in health will improve Outcome: Progressing   Problem: Fluid Volume: Goal: Ability to maintain a balanced intake and output will improve Outcome: Progressing   Problem: Health Behavior/Discharge Planning: Goal: Ability to identify and utilize available resources and services will improve Outcome: Progressing Goal: Ability to manage health-related needs will improve Outcome: Progressing   Problem: Metabolic: Goal: Ability to maintain appropriate glucose levels will improve Outcome: Progressing   Problem: Nutritional: Goal: Maintenance of adequate nutrition will improve Outcome: Progressing Goal: Progress toward achieving an optimal weight will improve Outcome: Progressing   Problem: Skin Integrity: Goal: Risk for impaired skin integrity will decrease Outcome: Progressing   Problem: Tissue Perfusion: Goal: Adequacy of tissue perfusion will improve Outcome: Progressing   Problem: Education: Goal: Knowledge of General Education information will improve Description: Including pain rating scale, medication(s)/side effects and non-pharmacologic comfort measures Outcome: Progressing   Problem: Health Behavior/Discharge Planning: Goal: Ability to manage health-related needs will improve Outcome: Progressing   Problem: Clinical Measurements: Goal: Ability to maintain clinical measurements within normal limits will improve Outcome: Progressing Goal: Will remain free from infection Outcome: Progressing Goal: Diagnostic test results will improve Outcome: Progressing Goal: Respiratory complications will improve Outcome: Progressing Goal: Cardiovascular complication will  be avoided Outcome: Progressing   Problem: Activity: Goal: Risk for activity intolerance will decrease Outcome: Progressing   Problem: Nutrition: Goal: Adequate nutrition will be maintained Outcome: Progressing   Problem: Coping: Goal: Level of anxiety will decrease Outcome: Progressing   Problem: Elimination: Goal: Will not experience complications related to bowel motility Outcome: Progressing Goal: Will not experience complications related to urinary retention Outcome: Progressing   Problem: Pain Managment: Goal: General experience of comfort will improve Outcome: Progressing   Problem: Safety: Goal: Ability to remain free from injury will improve Outcome: Progressing   Problem: Skin Integrity: Goal: Risk for impaired skin integrity will decrease Outcome: Progressing   Problem: Urinary Elimination: Goal: Signs and symptoms of infection will decrease Outcome: Progressing   

## 2022-05-19 DIAGNOSIS — N390 Urinary tract infection, site not specified: Secondary | ICD-10-CM | POA: Diagnosis not present

## 2022-05-19 DIAGNOSIS — B9689 Other specified bacterial agents as the cause of diseases classified elsewhere: Secondary | ICD-10-CM | POA: Diagnosis not present

## 2022-05-19 LAB — CBC WITH DIFFERENTIAL/PLATELET
Abs Immature Granulocytes: 0.01 10*3/uL (ref 0.00–0.07)
Basophils Absolute: 0 10*3/uL (ref 0.0–0.1)
Basophils Relative: 0 %
Eosinophils Absolute: 0.2 10*3/uL (ref 0.0–0.5)
Eosinophils Relative: 3 %
HCT: 28 % — ABNORMAL LOW (ref 39.0–52.0)
Hemoglobin: 9.5 g/dL — ABNORMAL LOW (ref 13.0–17.0)
Immature Granulocytes: 0 %
Lymphocytes Relative: 23 %
Lymphs Abs: 1.2 10*3/uL (ref 0.7–4.0)
MCH: 30.2 pg (ref 26.0–34.0)
MCHC: 33.9 g/dL (ref 30.0–36.0)
MCV: 88.9 fL (ref 80.0–100.0)
Monocytes Absolute: 0.8 10*3/uL (ref 0.1–1.0)
Monocytes Relative: 15 %
Neutro Abs: 3.1 10*3/uL (ref 1.7–7.7)
Neutrophils Relative %: 59 %
Platelets: 241 10*3/uL (ref 150–400)
RBC: 3.15 MIL/uL — ABNORMAL LOW (ref 4.22–5.81)
RDW: 15.1 % (ref 11.5–15.5)
WBC: 5.4 10*3/uL (ref 4.0–10.5)
nRBC: 0 % (ref 0.0–0.2)

## 2022-05-19 LAB — RENAL FUNCTION PANEL
Albumin: 2.5 g/dL — ABNORMAL LOW (ref 3.5–5.0)
Anion gap: 11 (ref 5–15)
BUN: 101 mg/dL — ABNORMAL HIGH (ref 8–23)
CO2: 20 mmol/L — ABNORMAL LOW (ref 22–32)
Calcium: 7.8 mg/dL — ABNORMAL LOW (ref 8.9–10.3)
Chloride: 103 mmol/L (ref 98–111)
Creatinine, Ser: 7.27 mg/dL — ABNORMAL HIGH (ref 0.61–1.24)
GFR, Estimated: 8 mL/min — ABNORMAL LOW (ref >60)
Glucose, Bld: 189 mg/dL — ABNORMAL HIGH (ref 70–99)
Phosphorus: 7.7 mg/dL — ABNORMAL HIGH (ref 2.5–4.6)
Potassium: 5 mmol/L (ref 3.5–5.1)
Sodium: 134 mmol/L — ABNORMAL LOW (ref 135–145)

## 2022-05-19 LAB — GLUCOSE, CAPILLARY
Glucose-Capillary: 131 mg/dL — ABNORMAL HIGH (ref 70–99)
Glucose-Capillary: 130 mg/dL — ABNORMAL HIGH (ref 70–99)
Glucose-Capillary: 144 mg/dL — ABNORMAL HIGH (ref 70–99)
Glucose-Capillary: 162 mg/dL — ABNORMAL HIGH (ref 70–99)

## 2022-05-19 LAB — MAGNESIUM: Magnesium: 1.9 mg/dL (ref 1.7–2.4)

## 2022-05-19 LAB — CULTURE, GROUP A STREP (THRC)

## 2022-05-19 MED ORDER — PNEUMOCOCCAL VAC POLYVALENT 25 MCG/0.5ML IJ INJ: 0.5000 mL | INJECTION | INTRAMUSCULAR | Status: DC | PRN

## 2022-05-19 NOTE — Progress Notes (Signed)
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 coli and most recently ESBL Klebsiella, reflux, hyperlipidemia, anemia of chronic kidney disease presents the ER today with complaints of lower extremity edema and fluid retention for 2 weeks.  Patient was seen on 4/19 and diagnosed with UTI and sent home on Keflex but eventually cultures grew Klebsiella/ESBL.  Upon admission patient was started on meropenem.  05/17/2022: Patient seen.  No new complaints.  Will complete course of IV antibiotics. 05/18/2022: Patient seen.  Patient reports sore throat and left hip/thigh pain.  No trauma.  No fever or chills.  Complete course of IV antibiotics.  Throat swab for culture.  X-ray of the left hip.  Cepacol as needed. 05/19/2022: Patient will complete course of antibiotics tomorrow night.  Worsening renal function.  Will consult the nephrology team.  Patient may have acute kidney injury on chronic kidney disease stage V versus progression from chronic kidney disease stage V to end-stage renal disease.   Assessment & Plan:  Principal Problem:   Urinary tract infection due to ESBL Klebsiella Active Problems:   DM2 (diabetes mellitus, type 2) (HCC)   Essential hypertension   HLD (hyperlipidemia)   Gastroesophageal reflux disease without esophagitis   Anemia in chronic kidney disease (CKD)   Chronic diastolic heart failure (HCC)   CKD (chronic kidney disease) stage 5, GFR less than 15 ml/min (HCC)     Assessment and Plan: * Urinary tract infection due to ESBL Klebsiella Cultures from 4/19 grew ESBL, currently on meropenem.  Tentatively plan for total 5-day course supportive care. 05/19/2022: Complete course of antibiotics.  CKD (chronic kidney disease) stage 5, GFR  less than 15 ml/min (HCC) Follows with [facility]. Has been vascular surgery to schedule AVF vs AVG placement. Creatinine appears to be stable around 5.4.  Bicarb and potassium overall stable Daily sodium bicarb twice daily 05/17/2022: Continue to monitor renal function and electrolytes.  Avoid nephrotoxins.  Keep MAP greater than 65 mmHg. 05/18/2022: No new labs today. 05/19/2022: Query AKI on CKD stage V versus progression from CKD 5 to end-stage renal disease.  Consult nephrology team.  Chronic diastolic heart failure (HCC) Stable. Euvolemic.  Previous echo December 2023 shows EF 55% with grade 2 DD 05/19/2022: Compensated.  Anemia in chronic kidney disease (CKD) Chronic. Stable.  Hemoglobin 10.5 05/19/2022: Hemoglobin is at goal.  Gastroesophageal reflux disease without esophagitis Continue Pepcid  HLD (hyperlipidemia) Continue crestor 10 mg  Essential hypertension Stable.  Norvasc, Lasix, lisinopril, Toprol-XL.  IV as needed. 05/19/2022: Goal blood pressure should be less than 130/80 mmHg.  DM2 (diabetes mellitus, type 2) (HCC) Chronic.  Semglee 5 units at bedtime, sliding scale and Accu-Cheks  BPH - Flomax and Proscar  Sore throat: -Throat swab. -Cepacol as needed 05/19/2022: Cultures are still pending.  No sore throat reported today.  Left hip/thigh pain: -X-ray. -Adequate analgesia.   DVT prophylaxis: Subcu heparin Code Status: Full code Family Communication:   Status is: Inpatient Continue hospital stay for ESBL treatment for IV antibiotics       Diet Orders (From admission, onward)     Start     Ordered   05/15/22 2012  Diet renal/carb modified with fluid restriction Diet-HS Snack? Nothing; Fluid restriction: 2000 mL Fluid; Room service appropriate? Yes; Fluid consistency: Thin  Diet effective now       Question Answer Comment  Diet-HS Snack? Nothing   Fluid restriction: 2000 mL Fluid   Room service appropriate? Yes   Fluid consistency: Thin       05/15/22 2011            Subjective: -No new complaints.   Examination: General exam: Appears calm and comfortable  Respiratory system: Clear to auscultation.  Cardiovascular system: S1 & S2, soft systolic murmur with increased intensity of S2 component of the heart sound.   Gastrointestinal system: Abdomen is soft and nontender.   Central nervous system: Alert and oriented.  Patient moves all extremities.   Extremities: No leg edema.  Objective: Vitals:   05/18/22 1624 05/18/22 2059 05/19/22 0518 05/19/22 0854  BP: 121/63 122/76 122/60 139/71  Pulse: 85 90 78 89  Resp:  18 18 16   Temp: 98.5 F (36.9 C) 99 F (37.2 C) 100.2 F (37.9 C) 98.3 F (36.8 C)  TempSrc: Oral Oral Oral Oral  SpO2: 98% 99% 95% 99%  Weight:   102.9 kg   Height:        Intake/Output Summary (Last 24 hours) at 05/19/2022 1426 Last data filed at 05/19/2022 1300 Gross per 24 hour  Intake 1000.76 ml  Output 400 ml  Net 600.76 ml    Filed Weights   05/15/22 1243 05/15/22 2126 05/19/22 0518  Weight: 97.1 kg 96 kg 102.9 kg    Scheduled Meds:  amLODipine  10 mg Oral Daily   clopidogrel  75 mg Oral Daily   famotidine  10 mg Oral Daily   finasteride  5 mg Oral Daily   furosemide  80 mg Oral BID   gabapentin  300 mg Oral TID   heparin  5,000 Units Subcutaneous Q8H   hydrALAZINE  100 mg Oral TID   insulin aspart  0-5 Units Subcutaneous QHS   insulin aspart  0-9 Units Subcutaneous TID WC   insulin glargine-yfgn  5 Units Subcutaneous QHS   lisinopril  10 mg Oral Daily   metoCLOPramide  5 mg Oral TID AC & HS   metoprolol succinate  12.5 mg Oral Daily   polyethylene glycol  17 g Oral Daily   rosuvastatin  10 mg Oral Daily   sodium bicarbonate  1,300 mg Oral BID   tamsulosin  0.4 mg Oral QHS   Continuous Infusions:  meropenem (MERREM) IV 500 mg (05/19/22 0865)    Nutritional status     Body mass index is 29.13 kg/m.  Data Reviewed:   CBC: Recent Labs  Lab 05/15/22 1318  05/16/22 0032 05/17/22 0412 05/19/22 0219  WBC 5.3 6.9 4.6 5.4  NEUTROABS 3.2 5.0  --  3.1  HGB 11.2* 10.5* 9.7* 9.5*  HCT 34.3* 32.0* 28.7* 28.0*  MCV 90.5 89.6 87.5 88.9  PLT 268 274 250 241    Basic Metabolic Panel: Recent Labs  Lab 05/15/22 1318 05/16/22 0032 05/17/22 0412 05/19/22 0219  NA 136 133* 135 134*  K 3.7 3.7 4.2 5.0  CL 103 102 105 103  CO2 20* 21* 21* 20*  GLUCOSE 187* 223* 166* 189*  BUN 67* 64* 76* 101*  CREATININE 5.40* 5.39* 6.02* 7.27*  CALCIUM 8.6* 8.2* 8.1* 7.8*  MG  --  1.8 1.8 1.9  PHOS  --   --   --  7.7*    GFR: Estimated Creatinine Clearance: 13.7 mL/min (A) (by C-G formula based on SCr of 7.27 mg/dL (H)). Liver Function Tests: Recent Labs  Lab 05/15/22 1318 05/16/22 0032 05/19/22 0219  AST 12* 13*  --   ALT 13 11  --   ALKPHOS 71 60  --   BILITOT 0.5 0.5  --   PROT 6.9 6.2*  --   ALBUMIN 3.4* 3.0* 2.5*    No results for input(s): "LIPASE", "AMYLASE" in the last 168 hours. No results for input(s): "AMMONIA" in the last 168 hours. Coagulation Profile: No results for input(s): "INR", "PROTIME" in the last 168 hours. Cardiac Enzymes: No results for input(s): "CKTOTAL", "CKMB", "CKMBINDEX", "TROPONINI" in the last 168 hours. BNP (last 3 results) No results for input(s): "PROBNP" in the last 8760 hours. HbA1C: No results for input(s): "HGBA1C" in the last 72 hours. CBG: Recent Labs  Lab 05/18/22 1124 05/18/22 1626 05/18/22 2102 05/19/22 0721 05/19/22 1124  GLUCAP 103* 175* 135* 131* 130*    Lipid Profile: No results for input(s): "CHOL", "HDL", "LDLCALC", "TRIG", "CHOLHDL", "LDLDIRECT" in the last 72 hours. Thyroid Function Tests: No results for input(s): "TSH", "T4TOTAL", "FREET4", "T3FREE", "THYROIDAB" in the last 72 hours. Anemia Panel: No results for input(s): "VITAMINB12", "FOLATE", "FERRITIN", "TIBC", "IRON", "RETICCTPCT" in the last 72 hours. Sepsis Labs: No results for input(s): "PROCALCITON", "LATICACIDVEN" in  the last 168 hours.  Recent Results (from the past 240 hour(s))  Urine Culture     Status: Abnormal   Collection Time: 05/15/22  4:51 PM   Specimen: Urine, Clean Catch  Result Value Ref Range Status   Specimen Description URINE, CLEAN CATCH  Final   Special Requests NONE Reflexed from [id number]  Final   Culture (A)  Final    >=100,000 COLONIES/mL KLEBSIELLA PNEUMONIAE MULTI-DRUG RESISTANT ORGANISM CRITICAL RESULT CALLED TO, READ BACK BY AND VERIFIED WITH: PHARMD ADRIENNE Vincente Poli [id number] AT 1950 BY EC Performed at [facility], [address] [place], [place] [address]    Report Status 05/18/2022 FINAL  Final   Organism ID, Bacteria KLEBSIELLA PNEUMONIAE (A)  Final      Susceptibility   Klebsiella pneumoniae - MIC*    AMPICILLIN >=32 RESISTANT Resistant     CEFAZOLIN >=64 RESISTANT Resistant     CEFEPIME >=32 RESISTANT Resistant     CEFTRIAXONE >=64 RESISTANT Resistant     CIPROFLOXACIN >=4 RESISTANT Resistant     GENTAMICIN <=1 SENSITIVE Sensitive     IMIPENEM <=0.25 SENSITIVE Sensitive     NITROFURANTOIN >=512 RESISTANT Resistant     TRIMETH/SULFA >=320 RESISTANT Resistant     AMPICILLIN/SULBACTAM >=32 RESISTANT Resistant     PIP/TAZO >=128 RESISTANT Resistant     * >=100,000 COLONIES/mL KLEBSIELLA PNEUMONIAE  Carbapenem Resistance Panel     Status: None   Collection Time: 05/15/22  4:51 PM  Result Value Ref Range Status   Carba Resistance IMP Gene NOT DETECTED NOT DETECTED Final   Carba Resistance VIM Gene NOT DETECTED NOT DETECTED Final   Carba Resistance NDM Gene NOT DETECTED NOT DETECTED Final   Carba Resistance KPC Gene NOT DETECTED NOT DETECTED Final   Carba Resistance OXA48 Gene NOT DETECTED NOT DETECTED Final    Comment: (NOTE) Cepheid Carba-R is an FDA-cleared nucleic acid amplification test  (NAAT)for the detection and differentiation of genes encoding the  most prevalent carbapenemases in bacterial isolate samples. Carbapenemase gene identification and  implementation of comprehensive  infection control measures are recommended by the CDC to prevent the  spread of the resistant organisms. Performed at [facility], [address], [place], [place] [address]  Radiology Studies: DG FEMUR MIN 2 VIEWS LEFT  Result Date: 05/18/2022 CLINICAL DATA:  Pain EXAM: LEFT FEMUR 2 VIEWS COMPARISON:  None Available. FINDINGS: No fracture or dislocation is seen. There are no focal lytic lesions. Small bony spurs are seen in the patella. There is calcification in medial and lateral meniscal cartilages. Extensive arterial calcifications are seen in soft tissues. IMPRESSION: No acute findings are seen in left femur. Degenerative changes are noted in the left knee with bony spurs and chondrocalcinosis. Extensive arterial calcifications are seen. Electronically Signed   By: Ernie Avena M.D.   On: 05/18/2022 12:29           LOS: 4 days   Time spent= 35 mins    Barnetta Chapel, MD [facility]  If 7PM-7AM, please contact night-coverage  05/19/2022, 2:26 PM

## 2022-05-19 NOTE — Plan of Care (Signed)
  Problem: Tissue Perfusion: Goal: Adequacy of tissue perfusion will improve Outcome: Not Progressing   Problem: Metabolic: Goal: Ability to maintain appropriate glucose levels will improve Outcome: Not Progressing   Problem: Urinary Elimination: Goal: Signs and symptoms of infection will decrease Outcome: Not Progressing   Problem: Clinical Measurements: Goal: Will remain free from infection Outcome: Not Progressing

## 2022-05-20 ENCOUNTER — Inpatient Hospital Stay (HOSPITAL_COMMUNITY): Payer: Medicaid Other

## 2022-05-20 DIAGNOSIS — N390 Urinary tract infection, site not specified: Secondary | ICD-10-CM | POA: Diagnosis not present

## 2022-05-20 DIAGNOSIS — B9689 Other specified bacterial agents as the cause of diseases classified elsewhere: Secondary | ICD-10-CM | POA: Diagnosis not present

## 2022-05-20 LAB — RENAL FUNCTION PANEL
Albumin: 2.6 g/dL — ABNORMAL LOW (ref 3.5–5.0)
Anion gap: 14 (ref 5–15)
BUN: 107 mg/dL — ABNORMAL HIGH (ref 8–23)
CO2: 19 mmol/L — ABNORMAL LOW (ref 22–32)
Calcium: 8.2 mg/dL — ABNORMAL LOW (ref 8.9–10.3)
Chloride: 100 mmol/L (ref 98–111)
Creatinine, Ser: 7.75 mg/dL — ABNORMAL HIGH (ref 0.61–1.24)
GFR, Estimated: 7 mL/min — ABNORMAL LOW (ref >60)
Glucose, Bld: 138 mg/dL — ABNORMAL HIGH (ref 70–99)
Phosphorus: 8.4 mg/dL — ABNORMAL HIGH (ref 2.5–4.6)
Potassium: 5 mmol/L (ref 3.5–5.1)
Sodium: 133 mmol/L — ABNORMAL LOW (ref 135–145)

## 2022-05-20 LAB — GLUCOSE, CAPILLARY
Glucose-Capillary: 124 mg/dL — ABNORMAL HIGH (ref 70–99)
Glucose-Capillary: 139 mg/dL — ABNORMAL HIGH (ref 70–99)
Glucose-Capillary: 106 mg/dL — ABNORMAL HIGH (ref 70–99)
Glucose-Capillary: 186 mg/dL — ABNORMAL HIGH (ref 70–99)

## 2022-05-20 LAB — CREATININE, URINE, RANDOM: Creatinine, Urine: 90 mg/dL

## 2022-05-20 LAB — SODIUM, URINE, RANDOM: Sodium, Ur: 34 mmol/L

## 2022-05-20 LAB — CULTURE, GROUP A STREP (THRC)

## 2022-05-20 NOTE — Plan of Care (Signed)
  Problem: Nutritional: Goal: Maintenance of adequate nutrition will improve Outcome: Not Progressing   Problem: Metabolic: Goal: Ability to maintain appropriate glucose levels will improve Outcome: Not Progressing   Problem: Tissue Perfusion: Goal: Adequacy of tissue perfusion will improve Outcome: Not Progressing

## 2022-05-20 NOTE — Progress Notes (Signed)
An order came through to insert a foley cath, after insertion patient reminded me he was allergic to latex, foley removed, peri care performed, MD notified, new orders inserted. VSS, call light within reach. Per MD in and outs will need to be performed when patient unable to void.    Balinda Quails, RN 05/20/2022 3:31 PM

## 2022-05-20 NOTE — Progress Notes (Signed)
PROGRESS NOTE    Ryan Jenkins  ZOX:[id number] DOB: 1960-09-10 DOA: 05/15/2022 PCP: Malka So., MD   Brief Narrative:  62 year old African-American male history of CKD stage V, type 2 diabetes, hypertension, history of multidrug-resistant urinary tract infections including ESBL E. coli and most recently ESBL Klebsiella, reflux, hyperlipidemia, anemia of chronic kidney disease presents the ER today with complaints of lower extremity edema and fluid retention for 2 weeks.  Patient was seen on 4/19 and diagnosed with UTI and sent home on Keflex but eventually cultures grew Klebsiella/ESBL.  Upon admission patient was started on meropenem.  05/18/2022: Patient seen.  Patient reports sore throat and left hip/thigh pain.  No trauma.  No fever or chills.  Complete course of IV antibiotics.  Throat swab for culture.  X-ray of the left hip.  Cepacol as needed. 05/19/2022: Patient will complete course of antibiotics tomorrow night.  Worsening renal function.  Will consult the nephrology team.  Patient may have acute kidney injury on chronic kidney disease stage V versus progression from chronic kidney disease stage V to end-stage renal disease.  05/20/2022: Patient seen.  Nephrology input is appreciated.  ACE inhibitor and diuretics were held by the nephrology team.  Serum creatinine is up to 7.75 today with BUN of 107.  Nephrology team is following and directing care of acute kidney injury.  Patient will complete antibiotics tonight.    Assessment & Plan:  Principal Problem:   Urinary tract infection due to ESBL Klebsiella Active Problems:   DM2 (diabetes mellitus, type 2) (HCC)   Essential hypertension   HLD (hyperlipidemia)   Gastroesophageal reflux disease without esophagitis   Anemia in chronic kidney disease (CKD)   Chronic diastolic heart failure (HCC)   CKD (chronic kidney disease) stage 5, GFR less than 15 ml/min (HCC)     Assessment and Plan: * Urinary tract infection due to ESBL  Klebsiella Cultures from 4/19 grew ESBL, currently on meropenem.  Tentatively plan for total 5-day course supportive care. 05/20/2022: Antibiotics will be completed today.  Patient will be discharged when cleared for discharge by the nephrology team (current AKI).  Serum creatinine is continue to rise.  Acute kidney injury on chronic kidney disease stage IV/V:  -Follows with [facility]. Has seen vascular surgery to schedule AVF vs AVG placement. -Baseline creatinine appears to be stable around 5.4.   -Serum creatinine has risen to 7.5. -Nephrology team was consulted.  ACE inhibitor and diuretics on hold.   -Nephrology team is directing care.   Chronic diastolic heart failure (HCC) Stable. Euvolemic.  Previous echo December 2023 shows EF 55% with grade 2 DD 05/19/2022: Compensated.  Anemia in chronic kidney disease (CKD) Chronic. Stable.  Hemoglobin 10.5 05/19/2022: Hemoglobin is at goal.  Gastroesophageal reflux disease without esophagitis Continue Pepcid  HLD (hyperlipidemia) Continue crestor 10 mg  Essential hypertension Stable.  Norvasc, Lasix, lisinopril, Toprol-XL.  IV as needed. 05/19/2022: Goal blood pressure should be less than 130/80 mmHg.  DM2 (diabetes mellitus, type 2) (HCC) Chronic.  Semglee 5 units at bedtime, sliding scale and Accu-Cheks  BPH - Flomax and Proscar  Sore throat: -Throat swab. -Cepacol as needed 05/19/2022: Cultures are still pending.  No sore throat reported today.  Left hip/thigh pain: -X-ray. -Adequate analgesia.   DVT prophylaxis: Subcu heparin Code Status: Full code Family Communication:   Status is: Inpatient Patient has completed course of IV antibiotics for UTI secondary to Klebsiella ESBL.   -Discharge home when nephrology team clears patient for discharge.  Current AKI is noted.     Diet Orders (From admission, onward)     Start     Ordered   05/15/22 2012  Diet renal/carb modified with fluid restriction Diet-HS Snack?  Nothing; Fluid restriction: 2000 mL Fluid; Room service appropriate? Yes; Fluid consistency: Thin  Diet effective now       Question Answer Comment  Diet-HS Snack? Nothing   Fluid restriction: 2000 mL Fluid   Room service appropriate? Yes   Fluid consistency: Thin      05/15/22 2011            Subjective: -No new complaints.   Examination: General exam: Appears calm and comfortable  Respiratory system: Clear to auscultation.  Cardiovascular system: S1 & S2, soft systolic murmur with increased intensity of S2 component of the heart sound.   Gastrointestinal system: Abdomen is soft and nontender.   Central nervous system: Alert and oriented.  Patient moves all extremities.   Extremities: No leg edema.  Objective: Vitals:   05/19/22 2101 05/19/22 2101 05/20/22 0512 05/20/22 0937  BP: 129/62 129/62 126/67 (!) 121/59  Pulse:  81 77 82  Resp:  18 18 18   Temp:   98.7 F (37.1 C) 98.5 F (36.9 C)  TempSrc:   Oral Oral  SpO2:  98% 97% 99%  Weight:   108.1 kg   Height:        Intake/Output Summary (Last 24 hours) at 05/20/2022 1331 Last data filed at 05/20/2022 1300 Gross per 24 hour  Intake 752.76 ml  Output 800 ml  Net -47.24 ml    Filed Weights   05/15/22 2126 05/19/22 0518 05/20/22 0512  Weight: 96 kg 102.9 kg 108.1 kg    Scheduled Meds:  amLODipine  10 mg Oral Daily   clopidogrel  75 mg Oral Daily   famotidine  10 mg Oral Daily   finasteride  5 mg Oral Daily   gabapentin  300 mg Oral TID   heparin  5,000 Units Subcutaneous Q8H   hydrALAZINE  100 mg Oral TID   insulin aspart  0-5 Units Subcutaneous QHS   insulin aspart  0-9 Units Subcutaneous TID WC   insulin glargine-yfgn  5 Units Subcutaneous QHS   metoCLOPramide  5 mg Oral TID AC & HS   metoprolol succinate  12.5 mg Oral Daily   polyethylene glycol  17 g Oral Daily   rosuvastatin  10 mg Oral Daily   sodium bicarbonate  1,300 mg Oral BID   tamsulosin  0.4 mg Oral QHS   Continuous  Infusions:    Nutritional status     Body mass index is 30.6 kg/m.  Data Reviewed:   CBC: Recent Labs  Lab 05/15/22 1318 05/16/22 0032 05/17/22 0412 05/19/22 0219  WBC 5.3 6.9 4.6 5.4  NEUTROABS 3.2 5.0  --  3.1  HGB 11.2* 10.5* 9.7* 9.5*  HCT 34.3* 32.0* 28.7* 28.0*  MCV 90.5 89.6 87.5 88.9  PLT 268 274 250 241    Basic Metabolic Panel: Recent Labs  Lab 05/15/22 1318 05/16/22 0032 05/17/22 0412 05/19/22 0219 05/20/22 1114  NA 136 133* 135 134* 133*  K 3.7 3.7 4.2 5.0 5.0  CL 103 102 105 103 100  CO2 20* 21* 21* 20* 19*  GLUCOSE 187* 223* 166* 189* 138*  BUN 67* 64* 76* 101* 107*  CREATININE 5.40* 5.39* 6.02* 7.27* 7.75*  CALCIUM 8.6* 8.2* 8.1* 7.8* 8.2*  MG  --  1.8 1.8 1.9  --   PHOS  --   --   --  7.7* 8.4*    GFR: Estimated Creatinine Clearance: 13.1 mL/min (A) (by C-G formula based on SCr of 7.75 mg/dL (H)). Liver Function Tests: Recent Labs  Lab 05/15/22 1318 05/16/22 0032 05/19/22 0219 05/20/22 1114  AST 12* 13*  --   --   ALT 13 11  --   --   ALKPHOS 71 60  --   --   BILITOT 0.5 0.5  --   --   PROT 6.9 6.2*  --   --   ALBUMIN 3.4* 3.0* 2.5* 2.6*    No results for input(s): "LIPASE", "AMYLASE" in the last 168 hours. No results for input(s): "AMMONIA" in the last 168 hours. Coagulation Profile: No results for input(s): "INR", "PROTIME" in the last 168 hours. Cardiac Enzymes: No results for input(s): "CKTOTAL", "CKMB", "CKMBINDEX", "TROPONINI" in the last 168 hours. BNP (last 3 results) No results for input(s): "PROBNP" in the last 8760 hours. HbA1C: No results for input(s): "HGBA1C" in the last 72 hours. CBG: Recent Labs  Lab 05/19/22 1124 05/19/22 1700 05/19/22 2057 05/20/22 0751 05/20/22 1129  GLUCAP 130* 144* 162* 124* 139*    Lipid Profile: No results for input(s): "CHOL", "HDL", "LDLCALC", "TRIG", "CHOLHDL", "LDLDIRECT" in the last 72 hours. Thyroid Function Tests: No results for input(s): "TSH", "T4TOTAL", "FREET4",  "T3FREE", "THYROIDAB" in the last 72 hours. Anemia Panel: No results for input(s): "VITAMINB12", "FOLATE", "FERRITIN", "TIBC", "IRON", "RETICCTPCT" in the last 72 hours. Sepsis Labs: No results for input(s): "PROCALCITON", "LATICACIDVEN" in the last 168 hours.  Recent Results (from the past 240 hour(s))  Urine Culture     Status: Abnormal   Collection Time: 05/15/22  4:51 PM   Specimen: Urine, Clean Catch  Result Value Ref Range Status   Specimen Description URINE, CLEAN CATCH  Final   Special Requests NONE Reflexed from [phone]  Final   Culture (A)  Final    >=100,000 COLONIES/mL KLEBSIELLA PNEUMONIAE MULTI-DRUG RESISTANT ORGANISM CRITICAL RESULT CALLED TO, READ BACK BY AND VERIFIED WITH: PHARMD ADRIENNE Vincente Poli [id number] AT 1950 BY EC Performed at [facility], [address] [place], [place] [address]    Report Status 05/18/2022 FINAL  Final   Organism ID, Bacteria KLEBSIELLA PNEUMONIAE (A)  Final      Susceptibility   Klebsiella pneumoniae - MIC*    AMPICILLIN >=32 RESISTANT Resistant     CEFAZOLIN >=64 RESISTANT Resistant     CEFEPIME >=32 RESISTANT Resistant     CEFTRIAXONE >=64 RESISTANT Resistant     CIPROFLOXACIN >=4 RESISTANT Resistant     GENTAMICIN <=1 SENSITIVE Sensitive     IMIPENEM <=0.25 SENSITIVE Sensitive     NITROFURANTOIN >=512 RESISTANT Resistant     TRIMETH/SULFA >=320 RESISTANT Resistant     AMPICILLIN/SULBACTAM >=32 RESISTANT Resistant     PIP/TAZO >=128 RESISTANT Resistant     * >=100,000 COLONIES/mL KLEBSIELLA PNEUMONIAE  Carbapenem Resistance Panel     Status: None   Collection Time: 05/15/22  4:51 PM  Result Value Ref Range Status   Carba Resistance IMP Gene NOT DETECTED NOT DETECTED Final   Carba Resistance VIM Gene NOT DETECTED NOT DETECTED Final   Carba Resistance NDM Gene NOT DETECTED NOT DETECTED Final   Carba Resistance KPC Gene NOT DETECTED NOT DETECTED Final   Carba Resistance OXA48 Gene NOT DETECTED NOT DETECTED Final    Comment:  (NOTE) Cepheid Carba-R is an FDA-cleared nucleic acid amplification test  (NAAT)for the detection and differentiation of genes encoding the  most prevalent carbapenemases in bacterial  isolate samples. Carbapenemase gene identification and implementation of comprehensive  infection control measures are recommended by the CDC to prevent the  spread of the resistant organisms. Performed at [facility], [address] [place], [place] [address]   Culture, group A strep     Status: None (Preliminary result)   Collection Time: 05/18/22  9:43 AM   Specimen: Throat  Result Value Ref Range Status   Specimen Description THROAT  Final   Special Requests NONE  Final   Culture   Final    CULTURE REINCUBATED FOR BETTER GROWTH Performed at [facility], [address] [place], [place] [address]    Report Status PENDING  Incomplete         Radiology Studies: No results found.         LOS: 5 days   Time spent= 35 mins    Barnetta Chapel, MD [facility]  If 7PM-7AM, please contact night-coverage  05/20/2022, 1:31 PM

## 2022-05-20 NOTE — Consult Note (Signed)
Renal Service Consult Note [facility]  Ryan Jenkins 05/20/2022 Maree Krabbe, MD Requesting Physician: Dr. Dartha Lodge  Reason for Consult: Renal failure  HPI: The patient is a 62 y.o. year-old w/ PMH as below who presented to ED on 4/30 from home for fluid and urinary retention, 10 lbs wt gain over 2 weeks. Also started tx the week before for UTI. Hax hx of recurrent UTI's w/ difficult bugs to treat.  In ED BP's wnl, SpO2 100% on RA, creat 5.4, UA +bact, >50 wbcs, WBC 5K, Hb 11, CXR neg. No LE edema noted. ED gave IV lasix 60mg  and pt was admitted for UTI d/t ESBL Klebsiella (UCx from 4/19), started on IV meropenem. He was continued on his home lasix 80 bid (following the IV 60mg  lasix per ED). The 1st 3 days he had good UOP w/ total I/O's of 1500 in and 4600 out. Then UOP dropped off the last 2 days and creat increased from 5.3 at admission, to 6.2 and then 7.27 yesterday. We are asked to see for renal failure.   Pt seen in room. Denies any LE edema or SOB, wt gain's and feet swelling are better. Denies any nausea, anorexia or serious fatigue, no arm jerking or confusion. He has seen [facility] to get ready for AVF placement it sounds like from what he said. Pt has had multiple admissions lately (7 in 2023 and this is the 4th in 2024). Has had recurrent ESBL Klebsiella, once thought to be possibly a colonization. Also admits for vol overload/ diuresis. 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    ROS - denies CP, no joint pain, no HA, no blurry vision, no rash, no diarrhea, no nausea/ vomiting, no dysuria, no difficulty voiding   Past Medical History  Past Medical History:  Diagnosis Date   Anemia    Anginal pain (HCC)    Anxiety    Arthritis    BPH with obstruction/lower urinary tract symptoms    CAD (coronary artery disease)    cardiologist--- dr Lucienne Minks badal;  11-06-2019 cardiac cath  in [place] (result in care everywhere)  nonobstructive cad involing pRCA 60% (done in setting worseing CHF/ acute pulmonary edema requiring intubation/ AKI   Chronic combined systolic and diastolic CHF (congestive heart failure) (HCC)    Chronic kidney disease, stage IV (severe) (HCC)    nephrologist--- dr Ronalee Belts   Depression    Diabetic neuropathy (HCC)    Dyspnea    Edema of both lower extremities    Gastric ulcer without hemorrhage or perforation 12/25/2018   GERD (gastroesophageal reflux disease)    Heart murmur    History of community acquired pneumonia    ad[facility]  w/ [facility]F hypoxia w/ severe sepsis   Hyperlipidemia    Hypertension    Insulin dependent type 2 diabetes mellitus (HCC)    LBBB (left bundle branch block)    Pneumonia    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)    Uses walker    Vitamin B12 deficiency    Vitreous hemorrhage [facility])    Past Surgical History  Past Surgical History:  Procedure Laterality Date   CARDIAC CATHETERIZATION  11/06/2019   [facility] in [place];    nonobstructive CAD , pRCA 60% (result in care everywhere)   CATARACT EXTRACTION W/ INTRAOCULAR LENS IMPLANT Bilateral 2017   TRANSURETHRAL RESECTION OF PROSTATE N/A 10/16/2021  Procedure: TRANSURETHRAL RESECTION OF THE PROSTATE (TURP);  Surgeon: Jannifer Hick, MD;  Location: [facility];  Service: Urology;  Laterality: N/A;   Family History  Family History  Problem Relation Age of Onset   Renal cancer Mother    Hypertension Mother    Pancreatic cancer Mother    Hypertension Sister    Stroke Sister    Leukemia Maternal Uncle    Sickle cell trait Maternal Aunt    Colon cancer Neg Hx    Esophageal cancer Neg Hx    Rectal cancer Neg Hx    Stomach cancer Neg Hx    Social History  reports that he has never smoked. He has never been exposed to tobacco smoke. He has never used smokeless tobacco. He reports that he does not drink alcohol and does not use  drugs. Allergies  Allergies  Allergen Reactions   Claritin [Loratadine] Swelling and Other (See Comments)    Joint swelling    Hydrochlorothiazide Other (See Comments)    Dizziness   Latex Hives and Swelling   Lyrica [Pregabalin] Other (See Comments)    Depression. "Makes me loopy"    Metformin And Related Diarrhea and Nausea And Vomiting   Home medications Prior to Admission medications   Medication Sig Start Date End Date Taking? Authorizing Provider  amLODipine (NORVASC) 10 MG tablet Take 1 tablet (10 mg total) by mouth daily. 02/08/21  Yes Danford, Earl Lites, MD  Cholecalciferol (VITAMIN D3) 1.25 MG (50000 UT) CAPS Take 50,000 Units by mouth every Friday. 09/12/21  Yes [provider]  clopidogrel (PLAVIX) 75 MG tablet Take 75 mg by mouth daily. 05/10/22  Yes [provider]  doxycycline (VIBRAMYCIN) 100 MG capsule Take 100 mg by mouth 2 (two) times daily. Unsure if he's taking TID 04/20/22  Yes [provider]  famotidine (PEPCID) 10 MG tablet Take 1 tablet (10 mg total) by mouth daily. 04/09/22 05/15/22 Yes Zigmund Daniel., MD  finasteride (PROSCAR) 5 MG tablet Take 1 tablet (5 mg total) by mouth daily. 07/20/20  Yes Hoy Register, MD  furosemide (LASIX) 80 MG tablet Take 1 tablet (80 mg total) by mouth 2 (two) times daily. 03/29/22 06/27/22 Yes Dahal, Melina Schools, MD  gabapentin (NEURONTIN) 300 MG capsule Take 300 mg by mouth 3 (three) times daily. 04/27/22  Yes [provider]  glipiZIDE (GLUCOTROL) 5 MG tablet Take 5 mg by mouth daily. 04/25/22  Yes [provider]  hydrALAZINE (APRESOLINE) 100 MG tablet Take 100 mg by mouth 3 (three) times daily. 04/25/22  Yes [provider]  LANTUS SOLOSTAR 100 UNIT/ML Solostar Pen Inject 5 Units into the skin at bedtime. 03/29/22  Yes Dahal, Melina Schools, MD  lisinopril (ZESTRIL) 10 MG tablet Take 10 mg by mouth daily. 04/27/22  Yes [provider]  metoCLOPramide (REGLAN) 5 MG tablet Take 1  tablet (5 mg total) by mouth 4 (four) times daily -  before meals and at bedtime for 14 days. After 2 weeks, follow up with your PCP for further recommendations on how to take.  If your nausea improves prior to completing the 2 week course, you can use before meals and at bedtime only as needed. 04/09/22 05/15/22 Yes Zigmund Daniel., MD  metoprolol succinate (TOPROL-XL) 25 MG 24 hr tablet Take 0.5 tablets (12.5 mg total) by mouth daily. 03/30/22 06/28/22 Yes Dahal, Melina Schools, MD  oxyCODONE (OXY IR/ROXICODONE) 5 MG immediate release tablet Take 5 mg by mouth every 8 (eight) hours as needed (for  pain).   Yes [provider]  polyethylene glycol (MIRALAX / GLYCOLAX) 17 g packet Take 17 g by mouth daily. 03/30/22  Yes Dahal, Melina Schools, MD  potassium chloride (KLOR-CON M) 10 MEQ tablet Take 10 mEq by mouth daily. 04/25/22  Yes [provider]  rosuvastatin (CRESTOR) 10 MG tablet Take 10 mg by mouth daily.   Yes [provider]  sodium bicarbonate 650 MG tablet Take 1,300 mg by mouth 2 (two) times daily. 09/20/21  Yes [provider]  tamsulosin (FLOMAX) 0.4 MG CAPS capsule Take 0.4 mg by mouth at bedtime. 05/25/21  Yes [provider]  triamcinolone ointment (KENALOG) 0.5 % Apply 1 Application topically 2 (two) times daily as needed (for itching). 03/27/22  Yes [provider]  TYLENOL 500 MG tablet Take 500-1,000 mg by mouth every 6 (six) hours as needed for mild pain or headache.   Yes [provider]  meclizine (ANTIVERT) 25 MG tablet Take 25 mg by mouth daily as needed for dizziness. 08/08/21   [provider]  senna-docusate (SENOKOT-S) 8.6-50 MG tablet Take 1 tablet by mouth at bedtime. Patient not taking: Reported on 05/15/2022 03/29/22 06/27/22  Lorin Glass, MD     Vitals:   05/19/22 2101 05/19/22 2101 05/20/22 0512 05/20/22 0937  BP: 129/62 129/62 126/67 (!) 121/59  Pulse:  81 77 82  Resp:  18 18 18   Temp:   98.7 F (37.1 C) 98.5  F (36.9 C)  TempSrc:   Oral Oral  SpO2:  98% 97% 99%  Weight:   108.1 kg   Height:       Exam Gen alert, no distress No rash, cyanosis or gangrene Sclera anicteric, throat clear  No jvd or bruits Chest clear bilat to bases, no rales/ wheezing RRR no MRG Abd soft ntnd no mass or ascites +bs GU normal male MS no joint effusions or deformity Ext no LE or UE edema, no wounds or ulcers Neuro is alert, Ox 3 , nf          Date               Creat               eGFR   2022              2.5- 2.23 Jan 2021       2.2- 3.19 May 2021      5.0 >> 3.22 June 2021     2.52- 2.80        27- 41ml/min, CKD IV   Sept 2023      3.08                 22 ml/min   Oct 2023 2.94- 4.28 Nov 2021 3.78- 4.18 Dec 2021       3.74- 6.74   Jan 2024 4.63 1 14 ml/min   March 2024 3.92- 7.00 8- 17 ml/min    05/04/22 4.97  13 ml/min    4/30  5.40    5/01  5.39    5/02  6.02    5/04  7.27    05/20/22 pend   Home meds include - norvasc 10, plavix, pepcid, finasteride, lasix 80 bid, gabapentin, glipizide, hydralazine 100 tid, insulin glargine, lisniopril 10, reglan  5 qid, toprol xl 12.5 qd, oxy IR prn, klorcon 10 qd, crestor, sod bicarb, flomax, prns/ vits/ supps     B/l creatinine 3.9- 4.6, from jan- march 2024, eGFR 14- 17 ml/min     UA pending   UNa, UCr pend    Renal US - pending (last one march 2024)        Assessment/ Plan: AKI on CKD 4 - b/l creatinine 3.9- 4.6, from jan-march 2024, eGFR 14-17 ml/min.  Pt is f/b Dr Ronalee Belts at [facility]. Creatinine here was 5.4 on admit in setting of c/o wt gain and feet swelling, also dx'd w/ another ESBL UTI last week. On admit his po lasix 80 bid was continued, as well as acei, and pt had a good diuresis for 2-3 days then UOP dropped and creat has been rising up to 6, then 7.2 yesterday. Pt looks good, euvolemic/ no edema today and no uremic signs/ symptoms. Does not need acute HD. Will hold his po lasix for a few days and hold acei,  see if creat will stabilize. Get urine lytes, renal US, UA. Get bladder scan and renal US also w/ his hx of urinary bladder retention. Will follow.   ESBL Klebsiella UTI - recurrent issues, IV abx per pmd HTN - BP's wnl, holding acei, cont norvasc/ BB Anemia - related to CKD, stable Hb 9-11 range. Follow.       Vinson Moselle  MD [facility] 05/20/2022, 11:25 AM  Recent Labs  Lab 05/16/22 0032 05/17/22 0412 05/19/22 0219  HGB 10.5* 9.7* 9.5*  ALBUMIN 3.0*  --  2.5*  CALCIUM 8.2* 8.1* 7.8*  PHOS  --   --  7.7*  CREATININE 5.39* 6.02* 7.27*  K 3.7 4.2 5.0   Inpatient medications:  amLODipine  10 mg Oral Daily   clopidogrel  75 mg Oral Daily   famotidine  10 mg Oral Daily   finasteride  5 mg Oral Daily   gabapentin  300 mg Oral TID   heparin  5,000 Units Subcutaneous Q8H   hydrALAZINE  100 mg Oral TID   insulin aspart  0-5 Units Subcutaneous QHS   insulin aspart  0-9 Units Subcutaneous TID WC   insulin glargine-yfgn  5 Units Subcutaneous QHS   metoCLOPramide  5 mg Oral TID AC & HS   metoprolol succinate  12.5 mg Oral Daily   polyethylene glycol  17 g Oral Daily   rosuvastatin  10 mg Oral Daily   sodium bicarbonate  1,300 mg Oral BID   tamsulosin  0.4 mg Oral QHS    acetaminophen **OR** acetaminophen, guaiFENesin, hydrALAZINE, ipratropium-albuterol, melatonin, menthol-cetylpyridinium, metoprolol tartrate, ondansetron **OR** ondansetron (ZOFRAN) IV, pneumococcal 23 valent vaccine, senna-docusate, traZODone

## 2022-05-21 DIAGNOSIS — B9689 Other specified bacterial agents as the cause of diseases classified elsewhere: Secondary | ICD-10-CM | POA: Diagnosis not present

## 2022-05-21 DIAGNOSIS — N390 Urinary tract infection, site not specified: Secondary | ICD-10-CM | POA: Diagnosis not present

## 2022-05-21 LAB — RENAL FUNCTION PANEL
Albumin: 2.7 g/dL — ABNORMAL LOW (ref 3.5–5.0)
Anion gap: 12 (ref 5–15)
BUN: 114 mg/dL — ABNORMAL HIGH (ref 8–23)
CO2: 17 mmol/L — ABNORMAL LOW (ref 22–32)
Calcium: 7.9 mg/dL — ABNORMAL LOW (ref 8.9–10.3)
Chloride: 103 mmol/L (ref 98–111)
Creatinine, Ser: 8.23 mg/dL — ABNORMAL HIGH (ref 0.61–1.24)
GFR, Estimated: 7 mL/min — ABNORMAL LOW (ref >60)
Glucose, Bld: 176 mg/dL — ABNORMAL HIGH (ref 70–99)
Phosphorus: 8.5 mg/dL — ABNORMAL HIGH (ref 2.5–4.6)
Potassium: 4.9 mmol/L (ref 3.5–5.1)
Sodium: 132 mmol/L — ABNORMAL LOW (ref 135–145)

## 2022-05-21 LAB — GLUCOSE, CAPILLARY
Glucose-Capillary: 135 mg/dL — ABNORMAL HIGH (ref 70–99)
Glucose-Capillary: 144 mg/dL — ABNORMAL HIGH (ref 70–99)
Glucose-Capillary: 129 mg/dL — ABNORMAL HIGH (ref 70–99)
Glucose-Capillary: 108 mg/dL — ABNORMAL HIGH (ref 70–99)

## 2022-05-21 MED ORDER — GABAPENTIN 300 MG PO CAPS
300.0000 mg | ORAL_CAPSULE | Freq: Every day | ORAL | Status: DC
  Administered 2022-05-22 – 2022-05-25 (×4): 300 mg via ORAL
  Filled 2022-05-21 (×4): qty 1

## 2022-05-21 MED ORDER — SEVELAMER CARBONATE 800 MG PO TABS
800.0000 mg | ORAL_TABLET | Freq: Three times a day (TID) | ORAL | Status: DC
  Administered 2022-05-21 – 2022-05-25 (×7): 800 mg via ORAL
  Filled 2022-05-21 (×8): qty 1

## 2022-05-21 NOTE — Progress Notes (Signed)
Nephrology Follow-Up Consult note   Assessment/Recommendations: Ryan Jenkins is a/an 62 y.o. male with a past medical history significant for CKD 5, DM2, HTN, recurrent MDR UTI, admitted for AKI/urinary retention, lower extremity edema.       AKI on CKD 5: Baseline creatinine around 3.9-4.6.  Follows with Dr. Ronalee Belts.  Presented with lower extremity edema and underwent diuresis.  Likely over diuresis plus some AKI related to infection and obstruction -Crt higher today up to 8.2 but voiding more freely and no uremic symptoms. CTM for now -May need to further discussions regarding potential dialysis need -Continue with bladder scans and monitor progress -Holding Lasix and ACE inhibitor -Continue to monitor daily Cr, Dose meds for GFR -Monitor Daily I/Os, Daily weight  -Maintain MAP>65 for optimal renal perfusion.  -Avoid nephrotoxic medications including NSAIDs -Use synthetic opioids (Fentanyl/Dilaudid) if needed  ESBL Klebsiella UTI: Antibiotics per primary team.  Hypertension: Holding ACE inhibitor.  Continue amlodipine and beta-blocker.  Continue to monitor  Anemia due to CKD/chronic inflammation; continue to monitor and consider ESA  Hyponatremia: Sodium 132.  Likely related to decreased renal function.  Continue to monitor  Metabolic acidosis: Associated with AKI.  Continue to monitor and consider supplementation  Hyperphosphatemia: Phosphorus 8.5.  Start sevelamer with meals   Recommendations conveyed to primary service.    Darnell Level [facility] 05/21/2022 10:20 AM  ___________________________________________________________  CC: AKI  Interval History/Subjective: Patient states he feels well today.  Required in and out catheterization last night but was able to urinate this morning.  No other complaints denies nausea or vomiting.   Medications:  Current Facility-Administered Medications  Medication Dose Route Frequency Provider Last Rate  Last Admin   acetaminophen (TYLENOL) tablet 650 mg  650 mg Oral Q6H PRN Carollee Herter, DO   650 mg at 05/20/22 4098   Or   acetaminophen (TYLENOL) suppository 650 mg  650 mg Rectal Q6H PRN Carollee Herter, DO       amLODipine (NORVASC) tablet 10 mg  10 mg Oral Daily Carollee Herter, DO   10 mg at 05/21/22 0841   clopidogrel (PLAVIX) tablet 75 mg  75 mg Oral Daily Carollee Herter, DO   75 mg at 05/21/22 0841   famotidine (PEPCID) tablet 10 mg  10 mg Oral Daily Carollee Herter, DO   10 mg at 05/21/22 0841   finasteride (PROSCAR) tablet 5 mg  5 mg Oral Daily Carollee Herter, DO   5 mg at 05/21/22 0841   gabapentin (NEURONTIN) capsule 300 mg  300 mg Oral TID Carollee Herter, DO   300 mg at 05/21/22 0841   guaiFENesin (ROBITUSSIN) 100 MG/5ML liquid 5 mL  5 mL Oral Q4H PRN Dimple Nanas, MD       heparin injection 5,000 Units  5,000 Units Subcutaneous Q8H Carollee Herter, DO   5,000 Units at 05/21/22 1191   hydrALAZINE (APRESOLINE) injection 10 mg  10 mg Intravenous Q4H PRN Amin, Ankit Chirag, MD       hydrALAZINE (APRESOLINE) tablet 100 mg  100 mg Oral TID Carollee Herter, DO   100 mg at 05/21/22 0841   insulin aspart (novoLOG) injection 0-5 Units  0-5 Units Subcutaneous QHS Carollee Herter, DO   2 Units at 05/17/22 2101   insulin aspart (novoLOG) injection 0-9 Units  0-9 Units Subcutaneous TID WC Carollee Herter, DO   1 Units at 05/21/22 0841   insulin glargine-yfgn (SEMGLEE) injection 5 Units  5 Units Subcutaneous QHS Carollee Herter, DO  5 Units at 05/20/22 2156   ipratropium-albuterol (DUONEB) 0.5-2.5 (3) MG/3ML nebulizer solution 3 mL  3 mL Nebulization Q4H PRN Amin, Loura Halt, MD       melatonin tablet 10 mg  10 mg Oral QHS PRN Carollee Herter, DO   10 mg at 05/17/22 2055   menthol-cetylpyridinium (CEPACOL) lozenge 3 mg  1 lozenge Oral PRN Berton Mount I, MD   3 mg at 05/18/22 1155   metoCLOPramide (REGLAN) tablet 5 mg  5 mg Oral TID AC & HS Carollee Herter, DO   5 mg at 05/21/22 0841   metoprolol succinate (TOPROL-XL) 24 hr tablet 12.5 mg  12.5 mg  Oral Daily Carollee Herter, DO   12.5 mg at 05/21/22 0841   metoprolol tartrate (LOPRESSOR) injection 5 mg  5 mg Intravenous Q4H PRN Amin, Loura Halt, MD       ondansetron (ZOFRAN) tablet 4 mg  4 mg Oral Q6H PRN Carollee Herter, DO       Or   ondansetron [facility]) injection 4 mg  4 mg Intravenous Q6H PRN Carollee Herter, DO       pneumococcal 23 valent vaccine (PNEUMOVAX-23) injection 0.5 mL  0.5 mL Intramuscular Prior to discharge Barnetta Chapel, MD       polyethylene glycol (MIRALAX / GLYCOLAX) packet 17 g  17 g Oral Daily Carollee Herter, DO   17 g at 05/18/22 0932   rosuvastatin (CRESTOR) tablet 10 mg  10 mg Oral Daily Carollee Herter, DO   10 mg at 05/21/22 6578   senna-docusate (Senokot-S) tablet 1 tablet  1 tablet Oral QHS PRN Dimple Nanas, MD   1 tablet at 05/19/22 0518   sodium bicarbonate tablet 1,300 mg  1,300 mg Oral BID Carollee Herter, DO   1,300 mg at 05/21/22 0841   tamsulosin (FLOMAX) capsule 0.4 mg  0.4 mg Oral QHS Carollee Herter, DO   0.4 mg at 05/20/22 2153   traZODone (DESYREL) tablet 50 mg  50 mg Oral QHS PRN Dimple Nanas, MD   50 mg at 05/20/22 2151      Review of Systems: 10 systems reviewed and negative except per interval history/subjective  Physical Exam: Vitals:   05/20/22 2151 05/21/22 0838  BP: 138/68 (!) 145/67  Pulse: 92 88  Resp:  18  Temp: 98.7 F (37.1 C) 98.8 F (37.1 C)  SpO2: 98% 99%   Total I/O In: -  Out: 450 [Urine:450]  Intake/Output Summary (Last 24 hours) at 05/21/2022 1020 Last data filed at 05/21/2022 0830 Gross per 24 hour  Intake 480 ml  Output 1150 ml  Net -670 ml   Constitutional: well-appearing, no acute distress ENMT: ears and nose without scars or lesions, MMM CV: normal rate, no edema Respiratory: Bilateral chest rise, normal work of breathing Gastrointestinal: soft, non-tender, no palpable masses or hernias Skin: no visible lesions or rashes Psych: alert, judgement/insight appropriate, appropriate mood and affect   Test Results I  personally reviewed new and old clinical labs and radiology tests Lab Results  Component Value Date   NA 132 (L) 05/21/2022   K 4.9 05/21/2022   CL 103 05/21/2022   CO2 17 (L) 05/21/2022   BUN 114 (H) 05/21/2022   CREATININE 8.23 (H) 05/21/2022   CALCIUM 7.9 (L) 05/21/2022   ALBUMIN 2.7 (L) 05/21/2022   PHOS 8.5 (H) 05/21/2022    CBC Recent Labs  Lab 05/15/22 1318 05/16/22 0032 05/17/22 0412 05/19/22 0219  WBC 5.3 6.9 4.6 5.4  NEUTROABS 3.2  5.0  --  3.1  HGB 11.2* 10.5* 9.7* 9.5*  HCT 34.3* 32.0* 28.7* 28.0*  MCV 90.5 89.6 87.5 88.9  PLT 268 274 250 241

## 2022-05-21 NOTE — Progress Notes (Signed)
Per doctor's order straight catheterization due to bladder scan result of 660 ml urine retention. Per protocol, 500 ml yellow urine removed. Pt tolerated procedure well. Cleaned patient. Lowered bed to safe position.

## 2022-05-21 NOTE — Progress Notes (Signed)
PROGRESS NOTE    Ryan Jenkins  ZOX:[id number]  DOB: 1960/06/20  DOA: 05/15/2022 PCP: Malka So., MD Outpatient Specialists:   Hospital course:  62 year old with CKD 5 and recurrent UTIs is admitted with progression of renal disease and hypervolemia.  In ED patient was noted to have ESBL and started on meropenem.  He has been diuresed with IV Lasix and is followed closely by nephrology.  Subjective: Patient states that he feels okay, but continues to feel very tired without change.  No acute pain.  Patient states that he has required Foley catheters in the past but feels like he is urinating okay for now.  Admits to feeling like he is moving slowly   Objective: Vitals:   05/20/22 1203 05/20/22 1604 05/20/22 2151 05/21/22 0838  BP:  118/82 138/68 (!) 145/67  Pulse:  86 92 88  Resp:  18  18  Temp:  98.4 F (36.9 C) 98.7 F (37.1 C) 98.8 F (37.1 C)  TempSrc:  Oral Oral Oral  SpO2:  99% 98% 99%  Weight: 108.1 kg     Height:        Intake/Output Summary (Last 24 hours) at 05/21/2022 1154 Last data filed at 05/21/2022 0830 Gross per 24 hour  Intake 480 ml  Output 1150 ml  Net -670 ml   Filed Weights   05/19/22 0518 05/20/22 0512 05/20/22 1203  Weight: 102.9 kg 108.1 kg 108.1 kg     Exam:  General: Patient with notable slowness of speech and possible thought. Eyes: sclera anicteric, conjuctiva mild injection bilaterally CVS: S1-S2, regular  Respiratory:  decreased air entry bilaterally secondary to decreased inspiratory effort, rales at bases  GI: NABS, soft, NT  LE: Warm and well-perfused   Data Reviewed:  Basic Metabolic Panel: Recent Labs  Lab 05/16/22 0032 05/17/22 0412 05/19/22 0219 05/20/22 1114 05/21/22 0157  NA 133* 135 134* 133* 132*  K 3.7 4.2 5.0 5.0 4.9  CL 102 105 103 100 103  CO2 21* 21* 20* 19* 17*  GLUCOSE 223* 166* 189* 138* 176*  BUN 64* 76* 101* 107* 114*  CREATININE 5.39* 6.02* 7.27* 7.75* 8.23*  CALCIUM 8.2* 8.1* 7.8*  8.2* 7.9*  MG 1.8 1.8 1.9  --   --   PHOS  --   --  7.7* 8.4* 8.5*    CBC: Recent Labs  Lab 05/15/22 1318 05/16/22 0032 05/17/22 0412 05/19/22 0219  WBC 5.3 6.9 4.6 5.4  NEUTROABS 3.2 5.0  --  3.1  HGB 11.2* 10.5* 9.7* 9.5*  HCT 34.3* 32.0* 28.7* 28.0*  MCV 90.5 89.6 87.5 88.9  PLT 268 274 250 241     Scheduled Meds:  amLODipine  10 mg Oral Daily   clopidogrel  75 mg Oral Daily   famotidine  10 mg Oral Daily   finasteride  5 mg Oral Daily   gabapentin  300 mg Oral TID   heparin  5,000 Units Subcutaneous Q8H   hydrALAZINE  100 mg Oral TID   insulin aspart  0-5 Units Subcutaneous QHS   insulin aspart  0-9 Units Subcutaneous TID WC   insulin glargine-yfgn  5 Units Subcutaneous QHS   metoCLOPramide  5 mg Oral TID AC & HS   metoprolol succinate  12.5 mg Oral Daily   polyethylene glycol  17 g Oral Daily   rosuvastatin  10 mg Oral Daily   sevelamer carbonate  800 mg Oral TID WC   sodium bicarbonate  1,300 mg Oral BID  tamsulosin  0.4 mg Oral QHS   Continuous Infusions:   Assessment & Plan:   Acute on CKD 5 BOO/BPH/areflexic bladder Slowness of thought/speech--unclear if uremic Acidosis Nephrology managing, much appreciated Patient required 600 cc urine removal by straight cath earlier today, but patient states he is voiding well.  Follow PVR, replace Foley catheter if warranted. Continue tamsulosin  ESBL Klebsiella UTI Patient has completed 5 days of meropenem Follow clinically  Sore throat Resolved with Cepacol Throat swab negative for group A strep  HFpEF HTN Patient with good urine output, diuresing well, off Lasix BP is reasonably controlled amlodipine, hydralazine and Toprol-XL ACE inhibitor being held in her nephrology  DM 2 Blood sugar under reasonable control on present regimen  GERD Continue PPI  CAD Continue Plavix per outpatient regimen Continue beta-blocker.     DVT prophylaxis: Subcu heparin Code Status: Full Family  Communication: None today     Studies: US RENAL  Result Date: 05/20/2022 CLINICAL DATA:  [id number] Acute renal failure superimposed on stage 4 chronic kidney disease, unspecified acute renal failure type [facility]) [id number] EXAM: RENAL / URINARY TRACT ULTRASOUND COMPLETE COMPARISON:  March 28, 2022. April 04, 2022 FINDINGS: Right Kidney: Renal measurements: 11.3 x 7.3 x 4.5 cm = volume: 277 mL. Echogenicity is similar in comparison to prior. No mass or hydronephrosis visualized. Left Kidney: Renal measurements: 11.6 x 6.6 x 6.1 cm = volume: 243 mL. Echogenicity is similar in comparison to prior. No mass or hydronephrosis visualized. Bladder: Bladder wall is mildly prominent for degree of distension. Other: None. IMPRESSION: 1. No hydronephrosis. 2. Bladder wall is mildly prominent for degree of distension, similar in comparison to prior CT. This could reflect the sequela of chronic outlet obstruction. Correlate with urinalysis. Electronically Signed   By: Meda Klinefelter M.D.   On: 05/20/2022 15:50    Principal Problem:   Urinary tract infection due to ESBL Klebsiella Active Problems:   CKD (chronic kidney disease) stage 5, GFR less than 15 ml/min (HCC)   DM2 (diabetes mellitus, type 2) (HCC)   Essential hypertension   HLD (hyperlipidemia)   Gastroesophageal reflux disease without esophagitis   Anemia in chronic kidney disease (CKD)   Chronic diastolic heart failure (HCC)     Ia Leeb Tublu Nirvan Laban, [facility]  If 7PM-7AM, please contact night-coverage [email or web address]   LOS: 6 days

## 2022-05-22 DIAGNOSIS — N390 Urinary tract infection, site not specified: Secondary | ICD-10-CM | POA: Diagnosis not present

## 2022-05-22 DIAGNOSIS — B9689 Other specified bacterial agents as the cause of diseases classified elsewhere: Secondary | ICD-10-CM | POA: Diagnosis not present

## 2022-05-22 LAB — RENAL FUNCTION PANEL
Albumin: 2.4 g/dL — ABNORMAL LOW (ref 3.5–5.0)
Anion gap: 13 (ref 5–15)
BUN: 121 mg/dL — ABNORMAL HIGH (ref 8–23)
CO2: 18 mmol/L — ABNORMAL LOW (ref 22–32)
Calcium: 8.1 mg/dL — ABNORMAL LOW (ref 8.9–10.3)
Chloride: 102 mmol/L (ref 98–111)
Creatinine, Ser: 8.34 mg/dL — ABNORMAL HIGH (ref 0.61–1.24)
GFR, Estimated: 7 mL/min — ABNORMAL LOW (ref >60)
Glucose, Bld: 136 mg/dL — ABNORMAL HIGH (ref 70–99)
Phosphorus: 8.8 mg/dL — ABNORMAL HIGH (ref 2.5–4.6)
Potassium: 4.9 mmol/L (ref 3.5–5.1)
Sodium: 133 mmol/L — ABNORMAL LOW (ref 135–145)

## 2022-05-22 LAB — GLUCOSE, CAPILLARY
Glucose-Capillary: 132 mg/dL — ABNORMAL HIGH (ref 70–99)
Glucose-Capillary: 132 mg/dL — ABNORMAL HIGH (ref 70–99)
Glucose-Capillary: 128 mg/dL — ABNORMAL HIGH (ref 70–99)
Glucose-Capillary: 203 mg/dL — ABNORMAL HIGH (ref 70–99)

## 2022-05-22 LAB — URINALYSIS, ROUTINE W REFLEX MICROSCOPIC
Bilirubin Urine: NEGATIVE
Glucose, UA: NEGATIVE mg/dL
Ketones, ur: NEGATIVE mg/dL
Nitrite: NEGATIVE
Protein, ur: 100 mg/dL — AB
Specific Gravity, Urine: 1.011 (ref 1.005–1.030)
WBC, UA: >50 WBC/hpf (ref 0–5)
pH: 5 (ref 5.0–8.0)

## 2022-05-22 LAB — HEPATITIS B SURFACE ANTIGEN: Hepatitis B Surface Ag: NONREACTIVE

## 2022-05-22 MED ORDER — CHLORHEXIDINE GLUCONATE CLOTH 2 % EX PADS
6.0000 | MEDICATED_PAD | Freq: Every day | CUTANEOUS | Status: DC
  Administered 2022-05-23 – 2022-05-25 (×3): 6 via TOPICAL

## 2022-05-22 NOTE — Progress Notes (Signed)
PROGRESS NOTE    Ryan Jenkins  ZOX:[id number]  DOB: 1960/04/17  DOA: 05/15/2022 PCP: Ryan So., MD Outpatient Specialists:   Hospital course:  62 year old with CKD 5 and recurrent UTIs is admitted with progression of renal disease and hypervolemia.  In ED patient was noted to have ESBL and started on meropenem.  He has been diuresed with IV Lasix and is followed closely by nephrology.  Subjective:  Patient states he feels much better today, is not sure why he feels so much better.  Notes he has been sleeping well.  However notes that his mentation is improved and he feels like he has more energy.  Understands plan is for possible initiation of HD with placement of dialysis catheter tomorrow per nephrology.  He is agreeable with plan   Objective: Vitals:   05/21/22 2156 05/22/22 0624 05/22/22 0810 05/22/22 0851  BP: (!) 143/74 137/67 (!) 153/87 (!) 158/76  Pulse: 87 84 87 95  Resp: 18 20 20 18   Temp: 98.6 F (37 C) 98.3 F (36.8 C) 98.3 F (36.8 C) 98.4 F (36.9 C)  TempSrc: Oral Oral Oral Oral  SpO2: 97% 97% 99% 99%  Weight:      Height:        Intake/Output Summary (Last 24 hours) at 05/22/2022 1139 Last data filed at 05/22/2022 1000 Gross per 24 hour  Intake 700 ml  Output 1100 ml  Net -400 ml    Filed Weights   05/19/22 0518 05/20/22 0512 05/20/22 1203  Weight: 102.9 kg 108.1 kg 108.1 kg     Exam:  General: Patient sitting up in bed, appears to have much more energy. Eyes: sclera anicteric, conjuctiva mild injection bilaterally CVS: S1-S2, regular  Respiratory:  decreased air entry bilaterally secondary to decreased inspiratory effort, rales at bases  GI: NABS, soft, NT  LE: Warm and well-perfused   Data Reviewed:  Basic Metabolic Panel: Recent Labs  Lab 05/16/22 0032 05/17/22 0412 05/19/22 0219 05/20/22 1114 05/21/22 0157 05/22/22 0353  NA 133* 135 134* 133* 132* 133*  K 3.7 4.2 5.0 5.0 4.9 4.9  CL 102 105 103 100 103 102  CO2  21* 21* 20* 19* 17* 18*  GLUCOSE 223* 166* 189* 138* 176* 136*  BUN 64* 76* 101* 107* 114* 121*  CREATININE 5.39* 6.02* 7.27* 7.75* 8.23* 8.34*  CALCIUM 8.2* 8.1* 7.8* 8.2* 7.9* 8.1*  MG 1.8 1.8 1.9  --   --   --   PHOS  --   --  7.7* 8.4* 8.5* 8.8*     CBC: Recent Labs  Lab 05/15/22 1318 05/16/22 0032 05/17/22 0412 05/19/22 0219  WBC 5.3 6.9 4.6 5.4  NEUTROABS 3.2 5.0  --  3.1  HGB 11.2* 10.5* 9.7* 9.5*  HCT 34.3* 32.0* 28.7* 28.0*  MCV 90.5 89.6 87.5 88.9  PLT 268 274 250 241      Scheduled Meds:  amLODipine  10 mg Oral Daily   clopidogrel  75 mg Oral Daily   famotidine  10 mg Oral Daily   finasteride  5 mg Oral Daily   gabapentin  300 mg Oral Daily   heparin  5,000 Units Subcutaneous Q8H   hydrALAZINE  100 mg Oral TID   insulin aspart  0-5 Units Subcutaneous QHS   insulin aspart  0-9 Units Subcutaneous TID WC   insulin glargine-yfgn  5 Units Subcutaneous QHS   metoCLOPramide  5 mg Oral TID AC & HS   metoprolol succinate  12.5 mg Oral  Daily   polyethylene glycol  17 g Oral Daily   rosuvastatin  10 mg Oral Daily   sevelamer carbonate  800 mg Oral TID WC   sodium bicarbonate  1,300 mg Oral BID   tamsulosin  0.4 mg Oral QHS   Continuous Infusions:   Assessment & Plan:   Acute on CKD 5 BOO/BPH/areflexic bladder Slowness of thought/speech--unclear if uremic Acidosis Patient feels much better, notes that he feels much less sluggish and has somewhat more energy Per nephrology note, plan is to place [facility] to tomorrow if creatinine not improved and initiate HD in house.  However if creatinine trends around, can cancel Nephrology managing, much appreciated Patient required 600 cc urine removal by straight cath yesterday but patient states he is voiding well.  Follow PVR, replace Foley catheter if warranted. Continue tamsulosin  ESBL Klebsiella UTI Patient has completed 5 days of meropenem Follow clinically  Sore throat Resolved with Cepacol Throat swab  negative for group A strep  HFpEF HTN Patient with good urine output, diuresing well, off Lasix BP is reasonably controlled amlodipine, hydralazine and Toprol-XL ACE inhibitor being held per nephrology  DM 2 Blood sugar under reasonable control on present regimen  GERD Continue PPI  CAD Continue Plavix per outpatient regimen Continue beta-blocker.     DVT prophylaxis: Subcu heparin Code Status: Full Family Communication: None today     Studies: US RENAL  Result Date: 05/20/2022 CLINICAL DATA:  [id number] Acute renal failure superimposed on stage 4 chronic kidney disease, unspecified acute renal failure type [facility]) [id number] EXAM: RENAL / URINARY TRACT ULTRASOUND COMPLETE COMPARISON:  March 28, 2022. April 04, 2022 FINDINGS: Right Kidney: Renal measurements: 11.3 x 7.3 x 4.5 cm = volume: 277 mL. Echogenicity is similar in comparison to prior. No mass or hydronephrosis visualized. Left Kidney: Renal measurements: 11.6 x 6.6 x 6.1 cm = volume: 243 mL. Echogenicity is similar in comparison to prior. No mass or hydronephrosis visualized. Bladder: Bladder wall is mildly prominent for degree of distension. Other: None. IMPRESSION: 1. No hydronephrosis. 2. Bladder wall is mildly prominent for degree of distension, similar in comparison to prior CT. This could reflect the sequela of chronic outlet obstruction. Correlate with urinalysis. Electronically Signed   By: Meda Klinefelter M.D.   On: 05/20/2022 15:50    Principal Problem:   Urinary tract infection due to ESBL Klebsiella Active Problems:   CKD (chronic kidney disease) stage 5, GFR less than 15 ml/min (HCC)   DM2 (diabetes mellitus, type 2) (HCC)   Essential hypertension   HLD (hyperlipidemia)   Gastroesophageal reflux disease without esophagitis   Anemia in chronic kidney disease (CKD)   Chronic diastolic heart failure (HCC)     [name] [name], [facility]  If 7PM-7AM, please contact  night-coverage [email or web address]   LOS: 7 days

## 2022-05-22 NOTE — Progress Notes (Signed)
Mobility Specialist Progress Note   05/22/22 1053  Mobility  Activity Ambulated with assistance in hallway  Level of Assistance Minimal assist, patient does 75% or more  Assistive Device Front wheel walker  Distance Ambulated (ft) 260 ft  Activity Response Tolerated well  Mobility Referral Yes  $Mobility charge 1 Mobility   Received in bed having mild c/o fatigue but agreeable to mobility. Stand by A for bed mobility but minA to stand after x3 attempts. X2 breaks(seated +standing) needed d/t increased fatigue. Able to make it back to bed w/o fault or LOB despite fatigue. Left w/ call bell in reach and bed alarm on   Frederico Hamman Mobility Specialist Please contact via SecureChat or  Rehab office at [phone]

## 2022-05-22 NOTE — Consult Note (Signed)
Chief Complaint: Patient was seen in consultation today for tunneled dialysis catheter placement Chief Complaint  Patient presents with   fluid retention   at the request of Dr Valentino Nose  Supervising Physician: Mir, Mauri Reading  Patient Status: [facility]  History of Present Illness: Ryan Jenkins is a 62 y.o. male   CKD 5 with UTIs; DM; HTN Progression of renal disease  Feeling some better today Sleeping well  Dr Valentino Nose has ordered tunneled dialysis catheter placement in IR 5/8--- dependent on am labs AKI on CKD 5: Baseline creatinine around 3.9-4.6.  Follows with Dr. Ronalee Belts.  Presented with lower extremity edema and underwent diuresis.  Likely over diuresis plus some AKI related to infection and obstruction -Crt persistently elevated at 8.3 today -Consult IR for [facility] tomorrow pending kidney function, if creatinine improves significantly could cancel procedure  Scheduled for possible tunneled dialysis catheter placement in IR  Past Medical History:  Diagnosis Date   Anemia    Anginal pain (HCC)    Anxiety    Arthritis    BPH with obstruction/lower urinary tract symptoms    CAD (coronary artery disease)    cardiologist--- dr Lucienne Minks badal;  11-06-2019 cardiac cath in [place] (result in care everywhere)  nonobstructive cad involing pRCA 60% (done in setting worseing CHF/ acute pulmonary edema requiring intubation/ AKI   Chronic combined systolic and diastolic CHF (congestive heart failure) (HCC)    Chronic kidney disease, stage IV (severe) (HCC)    nephrologist--- dr Ronalee Belts   Depression    Diabetic neuropathy (HCC)    Dyspnea    Edema of both lower extremities    Gastric ulcer without hemorrhage or perforation 12/25/2018   GERD (gastroesophageal reflux disease)    Heart murmur    History of community acquired pneumonia    ad[facility]  w/ ARF hypoxia w/ severe sepsis   Hyperlipidemia    Hypertension    Insulin dependent type 2 diabetes  mellitus (HCC)    LBBB (left bundle branch block)    Pneumonia    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)    Uses walker    Vitamin B12 deficiency    Vitreous hemorrhage [facility])     Past Surgical History:  Procedure Laterality Date   CARDIAC CATHETERIZATION  11/06/2019   [facility] in [place];    nonobstructive CAD , pRCA 60% (result in care everywhere)   CATARACT EXTRACTION W/ INTRAOCULAR LENS IMPLANT Bilateral 2017   TRANSURETHRAL RESECTION OF PROSTATE N/A 10/16/2021   Procedure: TRANSURETHRAL RESECTION OF THE PROSTATE (TURP);  Surgeon: Jannifer Hick, MD;  Location: [facility];  Service: Urology;  Laterality: N/A;    Allergies: Claritin [loratadine], Hydrochlorothiazide, Latex, Lyrica [pregabalin], and Metformin and related  Medications: Prior to Admission medications   Medication Sig Start Date End Date Taking? Authorizing Provider  amLODipine (NORVASC) 10 MG tablet Take 1 tablet (10 mg total) by mouth daily. 02/08/21  Yes Danford, Earl Lites, MD  Cholecalciferol (VITAMIN D3) 1.25 MG (50000 UT) CAPS Take 50,000 Units by mouth every Friday. 09/12/21  Yes [provider]  clopidogrel (PLAVIX) 75 MG tablet Take 75 mg by mouth daily. 05/10/22  Yes [provider]  doxycycline (VIBRAMYCIN) 100 MG capsule Take 100 mg by mouth 2 (two) times daily. Unsure if he's taking TID 04/20/22  Yes [provider]  famotidine (PEPCID) 10 MG tablet Take 1 tablet (10 mg total) by mouth daily. 04/09/22 05/15/22 Yes Zigmund Daniel., MD  finasteride (PROSCAR) 5 MG tablet Take 1 tablet (5 mg total) by mouth daily. 07/20/20  Yes Hoy Register, MD  furosemide (LASIX) 80 MG tablet Take 1 tablet (80 mg total) by mouth 2 (two) times daily. 03/29/22 06/27/22 Yes Dahal, Melina Schools, MD  gabapentin (NEURONTIN) 300 MG capsule Take 300 mg by mouth 3 (three) times daily. 04/27/22  Yes [provider]  glipiZIDE (GLUCOTROL) 5 MG tablet Take 5 mg by mouth daily. 04/25/22  Yes  [provider]  hydrALAZINE (APRESOLINE) 100 MG tablet Take 100 mg by mouth 3 (three) times daily. 04/25/22  Yes [provider]  LANTUS SOLOSTAR 100 UNIT/ML Solostar Pen Inject 5 Units into the skin at bedtime. 03/29/22  Yes Dahal, Melina Schools, MD  lisinopril (ZESTRIL) 10 MG tablet Take 10 mg by mouth daily. 04/27/22  Yes [provider]  metoCLOPramide (REGLAN) 5 MG tablet Take 1 tablet (5 mg total) by mouth 4 (four) times daily -  before meals and at bedtime for 14 days. After 2 weeks, follow up with your PCP for further recommendations on how to take.  If your nausea improves prior to completing the 2 week course, you can use before meals and at bedtime only as needed. 04/09/22 05/15/22 Yes Zigmund Daniel., MD  metoprolol succinate (TOPROL-XL) 25 MG 24 hr tablet Take 0.5 tablets (12.5 mg total) by mouth daily. 03/30/22 06/28/22 Yes Dahal, Melina Schools, MD  oxyCODONE (OXY IR/ROXICODONE) 5 MG immediate release tablet Take 5 mg by mouth every 8 (eight) hours as needed (for pain).   Yes [provider]  polyethylene glycol (MIRALAX / GLYCOLAX) 17 g packet Take 17 g by mouth daily. 03/30/22  Yes Dahal, Melina Schools, MD  potassium chloride (KLOR-CON M) 10 MEQ tablet Take 10 mEq by mouth daily. 04/25/22  Yes [provider]  rosuvastatin (CRESTOR) 10 MG tablet Take 10 mg by mouth daily.   Yes [provider]  sodium bicarbonate 650 MG tablet Take 1,300 mg by mouth 2 (two) times daily. 09/20/21  Yes [provider]  tamsulosin (FLOMAX) 0.4 MG CAPS capsule Take 0.4 mg by mouth at bedtime. 05/25/21  Yes [provider]  triamcinolone ointment (KENALOG) 0.5 % Apply 1 Application topically 2 (two) times daily as needed (for itching). 03/27/22  Yes [provider]  TYLENOL 500 MG tablet Take 500-1,000 mg by mouth every 6 (six) hours as needed for mild pain or headache.   Yes [provider]  meclizine (ANTIVERT) 25 MG tablet Take 25 mg by mouth  daily as needed for dizziness. 08/08/21   [provider]  senna-docusate (SENOKOT-S) 8.6-50 MG tablet Take 1 tablet by mouth at bedtime. Patient not taking: Reported on 05/15/2022 03/29/22 06/27/22  Lorin Glass, MD     Family History  Problem Relation Age of Onset   Renal cancer Mother    Hypertension Mother    Pancreatic cancer Mother    Hypertension Sister    Stroke Sister    Leukemia Maternal Uncle    Sickle cell trait Maternal Aunt    Colon cancer Neg Hx    Esophageal cancer Neg Hx    Rectal cancer Neg Hx    Stomach cancer Neg Hx     Social History   Socioeconomic History   Marital status: Divorced    Spouse name: Not on file   Number of children: 1   Years of education: 12   Highest education level: Not on file  Occupational History   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, rehab tech  Tobacco Use   Smoking status: Never    Passive exposure: Never   Smokeless tobacco: Never  Vaping Use   Vaping Use: Never used  Substance and Sexual Activity   Alcohol use: No    Alcohol/week: 0.0 standard drinks of alcohol   Drug use: No   Sexual activity: Yes  Other Topics Concern   Not on file  Social History Narrative   Not on file   Social Determinants of Health   Financial Resource Strain: Not on file  Food Insecurity: No Food Insecurity (05/15/2022)   Hunger Vital Sign    Worried About Running Out of Food in the Last Year: Never true    Ran Out of Food in the Last Year: Never true  Transportation Needs: No Transportation Needs (05/15/2022)   PRAPARE - Administrator, Civil Service (Medical): No    Lack of Transportation (Non-Medical): No  Physical Activity: Not on file  Stress: Not on file  Social Connections: Not on file    Review of Systems: A 12 point ROS discussed and pertinent positives are indicated in the HPI above.  All other systems are negative.  Review of Systems  Constitutional:  Negative for fatigue and fever.   Gastrointestinal:  Negative for abdominal pain.  Psychiatric/Behavioral:  Negative for behavioral problems and confusion.     Vital Signs: BP (!) 158/76 (BP Location: Right Arm)   Pulse 95   Temp 98.4 F (36.9 C) (Oral)   Resp 18   Ht 6\' 2"  (1.88 m)   Wt 238 lb 5.1 oz (108.1 kg)   SpO2 99%   BMI 30.60 kg/m     Physical Exam Vitals reviewed.  HENT:     Mouth/Throat:     Mouth: Mucous membranes are moist.  Cardiovascular:     Rate and Rhythm: Normal rate and regular rhythm.     Heart sounds: Normal heart sounds.  Pulmonary:     Breath sounds: Normal breath sounds.  Abdominal:     Palpations: Abdomen is soft.  Musculoskeletal:        General: Normal range of motion.  Skin:    General: Skin is warm.  Neurological:     Mental Status: He is alert and oriented to person, place, and time.  Psychiatric:        Behavior: Behavior normal.     Imaging: US RENAL  Result Date: 05/20/2022 CLINICAL DATA:  [id number] Acute renal failure superimposed on stage 4 chronic kidney disease, unspecified acute renal failure type [facility]) [id number] EXAM: RENAL / URINARY TRACT ULTRASOUND COMPLETE COMPARISON:  March 28, 2022. April 04, 2022 FINDINGS: Right Kidney: Renal measurements: 11.3 x 7.3 x 4.5 cm = volume: 277 mL. Echogenicity is similar in comparison to prior. No mass or hydronephrosis visualized. Left Kidney: Renal measurements: 11.6 x 6.6 x 6.1 cm = volume: 243 mL. Echogenicity is similar in comparison to prior. No mass or hydronephrosis visualized. Bladder: Bladder wall is mildly prominent for degree of distension. Other: None. IMPRESSION: 1. No hydronephrosis. 2. Bladder wall is mildly prominent for degree of distension, similar in comparison to prior CT. This could reflect the sequela of chronic outlet obstruction. Correlate with urinalysis. Electronically Signed   By: Meda Klinefelter M.D.   On: 05/20/2022 15:50   DG FEMUR MIN 2 VIEWS LEFT  Result Date: 05/18/2022 CLINICAL DATA:  Pain  EXAM: LEFT FEMUR 2 VIEWS COMPARISON:  None Available. FINDINGS: No fracture or dislocation is seen.  There are no focal lytic lesions. Small bony spurs are seen in the patella. There is calcification in medial and lateral meniscal cartilages. Extensive arterial calcifications are seen in soft tissues. IMPRESSION: No acute findings are seen in left femur. Degenerative changes are noted in the left knee with bony spurs and chondrocalcinosis. Extensive arterial calcifications are seen. Electronically Signed   By: Ernie Avena M.D.   On: 05/18/2022 12:29   DG Chest 2 View  Result Date: 05/15/2022 CLINICAL DATA:  Shortness of breath. EXAM: CHEST - 2 VIEW COMPARISON:  Chest radiographs 05/04/2022 FINDINGS: The cardiac silhouette is borderline enlarged convex situated by AP technique and low lung volumes. No airspace consolidation, edema, pleural effusion, or pneumothorax is identified. No acute osseous abnormality is seen. IMPRESSION: No active cardiopulmonary disease. Electronically Signed   By: Sebastian Ache M.D.   On: 05/15/2022 13:25   DG Chest 2 View  Result Date: 05/04/2022 CLINICAL DATA:  62 year old male with shortness of breath. Vomiting. EXAM: CHEST - 2 VIEW COMPARISON:  Chest and abdominal radiographs 04/04/2022 and earlier. FINDINGS: Upright AP and lateral views of the chest at 0714 hours. Mildly lower lung volumes. Mediastinal contours remain within normal limits. Both lungs appear clear. No pneumothorax or pleural effusion. Negative visible bowel gas. No evidence of pneumoperitoneum. Visualized tracheal air column is within normal limits. No osseous abnormality identified. IMPRESSION: Lower lung volumes. No acute cardiopulmonary abnormality. Electronically Signed   By: Odessa Fleming M.D.   On: 05/04/2022 07:21   VAS [name] UPPER EXT VEIN MAPPING (PRE-OP AVF)  Result Date: 04/23/2022 UPPER EXTREMITY VEIN MAPPING Patient Name:  ACELIN TUFFORD  Date of Exam:   04/23/2022 Medical Rec #: [id number]           Accession #:    [id number] Date of Birth: February 10, 1960          Patient Gender: M Patient Age:   31 years Exam Location:  [facility] Procedure:      VAS [name] UPPER EXT VEIN MAPPING (PRE-OP AVF) Referring Phys: Cristal Deer DICKSON --------------------------------------------------------------------------------  Indications: Pre-access. Comparison Study: No prior study Performing Technologist: Gertie Fey MHA, RDMS, RVT, RDCS  Examination Guidelines: A complete evaluation includes B-mode imaging, spectral Doppler, color Doppler, and power Doppler as needed of all accessible portions of each vessel. Bilateral testing is considered an integral part of a complete examination. Limited examinations for reoccurring indications may be performed as noted. +-----------------+-------------+----------+--------------+ Right Cephalic   Diameter (cm)Depth (cm)   Findings    +-----------------+-------------+----------+--------------+ Shoulder             0.26                              +-----------------+-------------+----------+--------------+ Prox upper arm       0.26                              +-----------------+-------------+----------+--------------+ Mid upper arm        0.36                              +-----------------+-------------+----------+--------------+ Dist upper arm       0.35                              +-----------------+-------------+----------+--------------+ Antecubital fossa  0.21                 branching    +-----------------+-------------+----------+--------------+ Prox forearm         0.09                              +-----------------+-------------+----------+--------------+ Mid forearm          0.09                              +-----------------+-------------+----------+--------------+ Dist forearm                            not visualized +-----------------+-------------+----------+--------------+  +-----------------+-------------+----------+--------------+ Right Basilic    Diameter (cm)Depth (cm)   Findings    +-----------------+-------------+----------+--------------+ Mid upper arm        0.37                              +-----------------+-------------+----------+--------------+ Dist upper arm       0.32                              +-----------------+-------------+----------+--------------+ Antecubital fossa    0.38                              +-----------------+-------------+----------+--------------+ Prox forearm         0.21                              +-----------------+-------------+----------+--------------+ Mid forearm                             not visualized +-----------------+-------------+----------+--------------+ +-----------------+-------------+----------+---------+ Left Cephalic    Diameter (cm)Depth (cm)Findings  +-----------------+-------------+----------+---------+ Shoulder             0.19                         +-----------------+-------------+----------+---------+ Prox upper arm       0.19                         +-----------------+-------------+----------+---------+ Mid upper arm        0.28                         +-----------------+-------------+----------+---------+ Dist upper arm       0.26                         +-----------------+-------------+----------+---------+ Antecubital fossa    0.25                         +-----------------+-------------+----------+---------+ Prox forearm         0.17                         +-----------------+-------------+----------+---------+ Mid forearm          0.14               branching +-----------------+-------------+----------+---------+ Dist forearm  0.13                         +-----------------+-------------+----------+---------+ +-----------------+-------------+----------+--------+ Left Basilic     Diameter (cm)Depth (cm)Findings  +-----------------+-------------+----------+--------+ Mid upper arm        0.29                        +-----------------+-------------+----------+--------+ Dist upper arm       0.37                        +-----------------+-------------+----------+--------+ Antecubital fossa    0.22                        +-----------------+-------------+----------+--------+ Prox forearm         0.18                        +-----------------+-------------+----------+--------+ *See table(s) above for measurements and observations.  Diagnosing physician: Coral Else MD Electronically signed by Coral Else MD on 04/23/2022 at 4:24:30 PM.    Final    VAS [name] UPPER EXTREMITY ARTERIAL DUPLEX  Result Date: 04/23/2022  UPPER EXTREMITY DUPLEX STUDY Patient Name:  PARKER DELACERDA  Date of Exam:   04/23/2022 Medical Rec #: [id number]          Accession #:    [id number] Date of Birth: [birth year]          Patient Gender: M Patient Age:   88 years Exam Location:  [facility] Procedure:      VAS [name] UPPER EXTREMITY ARTERIAL DUPLEX Referring Phys: Cristal Deer DICKSON --------------------------------------------------------------------------------  Indications: Pre-surgical evaluation.  Comparison Study: No prior study Performing Technologist: Gertie Fey MHA, RDMS, RVT, RDCS  Examination Guidelines: A complete evaluation includes B-mode imaging, spectral Doppler, color Doppler, and power Doppler as needed of all accessible portions of each vessel. Bilateral testing is considered an integral part of a complete examination. Limited examinations for reoccurring indications may be performed as noted.  Right Pre-Dialysis Findings: +----------------------------+----------+-------------------+---------+--------+ Location                    PSV (cm/s)Intralum. Diam.    Waveform Comments                                       (cm)                                  +----------------------------+----------+-------------------+---------+--------+ Radial artery distal upper  70        0.3                biphasic          arm                                                                        +----------------------------+----------+-------------------+---------+--------+ Ulnar artery distal upper   91        0.3  biphasic          arm                                                                        +----------------------------+----------+-------------------+---------+--------+ Radial Art at Wrist         68        0.20               triphasic         +----------------------------+----------+-------------------+---------+--------+ Ulnar Art at Wrist          33        0.12               triphasic         +----------------------------+----------+-------------------+---------+--------+  Left Pre-Dialysis Findings: +-----------------------------+----------+-------------------+--------+--------+ Location                     PSV (cm/s)Intralum. Diam.    WaveformComments                                        (cm)                                +-----------------------------+----------+-------------------+--------+--------+ Radial artery distal upper   100       0.3                                 arm                                                                        +-----------------------------+----------+-------------------+--------+--------+ Ulnar artery distal upper arm77        0.3                                 +-----------------------------+----------+-------------------+--------+--------+ Radial Art at Wrist          59        0.16                                +-----------------------------+----------+-------------------+--------+--------+ Ulnar Art at Wrist           57        0.11                                 +-----------------------------+----------+-------------------+--------+--------+  Summary:  Right: No obstruction visualized in the right upper extremity. Left: No obstruction visualized in the left upper extremity. *See table(s) above for measurements and observations. Electronically signed by Coral Else MD on 04/23/2022 at 4:24:17 PM.    Final  Labs:  CBC: Recent Labs    05/15/22 1318 05/16/22 0032 05/17/22 0412 05/19/22 0219  WBC 5.3 6.9 4.6 5.4  HGB 11.2* 10.5* 9.7* 9.5*  HCT 34.3* 32.0* 28.7* 28.0*  PLT 268 274 250 241    COAGS: Recent Labs    07/03/21 1110 07/09/21 1212  INR 1.0 1.0  APTT 31  --     BMP: Recent Labs    05/19/22 0219 05/20/22 1114 05/21/22 0157 05/22/22 0353  NA 134* 133* 132* 133*  K 5.0 5.0 4.9 4.9  CL 103 100 103 102  CO2 20* 19* 17* 18*  GLUCOSE 189* 138* 176* 136*  BUN 101* 107* 114* 121*  CALCIUM 7.8* 8.2* 7.9* 8.1*  CREATININE 7.27* 7.75* 8.23* 8.34*  GFRNONAA 8* 7* 7* 7*    LIVER FUNCTION TESTS: Recent Labs    04/08/22 0455 05/04/22 0652 05/15/22 1318 05/16/22 0032 05/19/22 0219 05/20/22 1114 05/21/22 0157 05/22/22 0353  BILITOT 0.5 0.5 0.5 0.5  --   --   --   --   AST 16 15 12* 13*  --   --   --   --   ALT 10 9 13 11   --   --   --   --   ALKPHOS 53 63 71 60  --   --   --   --   PROT 7.2 7.6 6.9 6.2*  --   --   --   --   ALBUMIN 3.3* 3.6 3.4* 3.0* 2.5* 2.6* 2.7* 2.4*    TUMOR MARKERS: No results for input(s): "AFPTM", "CEA", "CA199", "CHROMGRNA" in the last 8760 hours.  Assessment and Plan:  Scheduled for possible tunneled dialysis catheter placement in IR 5/8 Dependent on am labs Risks and benefits discussed with the patient including, but not limited to bleeding, infection, vascular injury, pneumothorax which may require chest tube placement, air embolism or even death  All of the patient's questions were answered, patient is agreeable to proceed. Consent signed and in chart.  Thank you for this interesting  consult.  I greatly enjoyed meeting MESHACH NEZAT and look forward to participating in their care.  A copy of this report was sent to the requesting provider on this date.  Electronically Signed: Robet Leu, PA-C 05/22/2022, 12:35 PM   I spent a total of 20 Minutes    in face to face in clinical consultation, greater than 50% of which was counseling/coordinating care for possible tunneled HD catheter placement in IR

## 2022-05-22 NOTE — Progress Notes (Signed)
Nephrology Follow-Up Consult note   Assessment/Recommendations: Ryan Jenkins is a/an 62 y.o. male with a past medical history significant for CKD 5, DM2, HTN, recurrent MDR UTI, admitted for AKI/urinary retention, lower extremity edema.       AKI on CKD 5: Baseline creatinine around 3.9-4.6.  Follows with Dr. Ronalee Belts.  Presented with lower extremity edema and underwent diuresis.  Likely over diuresis plus some AKI related to infection and obstruction -Crt persistently elevated at 8.3 today -Consult IR for [facility] tomorrow pending kidney function, if creatinine improves significantly could cancel procedure -If patient starts dialysis tomorrow will consult [facility] for permanent access creation -Continue with bladder scans and monitor progress -Holding Lasix and ACE inhibitor -Continue to monitor daily Cr, Dose meds for GFR -Monitor Daily I/Os, Daily weight  -Maintain MAP>65 for optimal renal perfusion.  -Avoid nephrotoxic medications including NSAIDs -Use synthetic opioids (Fentanyl/Dilaudid) if needed  ESBL Klebsiella UTI: Antibiotics per primary team.  Hypertension: Holding ACE inhibitor.  Continue amlodipine and beta-blocker.  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  Likely related to decreased renal function.  Continue to monitor  Metabolic acidosis: Associated with AKI.  Continue to monitor and consider supplementation  Hyperphosphatemia: Phosphorus 8.5.  Started sevelamer with meals   Recommendations conveyed to primary service.    Darnell Level  [facility] 05/22/2022 10:39 AM  ___________________________________________________________  CC: AKI  Interval History/Subjective: Patient continues to feel well with no complaints.  Denies nausea or vomiting.  Has not required in and out catheterization again.  We discussed that his kidney function has not improved.  Even if it  did come back down to his baseline I think he would likely need dialysis in the next year.  We decided that if his kidney function is not better we will proceed with planning for dialysis.   Medications:  Current Facility-Administered Medications  Medication Dose Route Frequency Provider Last Rate Last Admin   acetaminophen (TYLENOL) tablet 650 mg  650 mg Oral Q6H PRN Carollee Herter, DO   650 mg at 05/22/22 1610   Or   acetaminophen (TYLENOL) suppository 650 mg  650 mg Rectal Q6H PRN Carollee Herter, DO       amLODipine (NORVASC) tablet 10 mg  10 mg Oral Daily Carollee Herter, DO   10 mg at 05/22/22 9604   clopidogrel (PLAVIX) tablet 75 mg  75 mg Oral Daily Carollee Herter, DO   75 mg at 05/22/22 5409   famotidine (PEPCID) tablet 10 mg  10 mg Oral Daily Carollee Herter, DO   10 mg at 05/22/22 8119   finasteride (PROSCAR) tablet 5 mg  5 mg Oral Daily Carollee Herter, DO   5 mg at 05/22/22 1478   gabapentin (NEURONTIN) capsule 300 mg  300 mg Oral Daily Leandro Reasoner Tublu, MD   300 mg at 05/22/22 2956   guaiFENesin (ROBITUSSIN) 100 MG/5ML liquid 5 mL  5 mL Oral Q4H PRN Dimple Nanas, MD       heparin injection 5,000 Units  5,000 Units Subcutaneous Q8H Carollee Herter, DO   5,000 Units at 05/22/22 0541   hydrALAZINE (APRESOLINE) injection 10 mg  10 mg Intravenous Q4H PRN Amin, Ankit Chirag, MD       hydrALAZINE (APRESOLINE) tablet 100 mg  100 mg Oral TID Carollee Herter, DO   100 mg at 05/22/22 2130   insulin aspart (novoLOG) injection 0-5 Units  0-5 Units Subcutaneous QHS Imogene Burn,  Eric, DO   2 Units at 05/17/22 2101   insulin aspart (novoLOG) injection 0-9 Units  0-9 Units Subcutaneous TID WC Carollee Herter, DO   1 Units at 05/22/22 0815   insulin glargine-yfgn [facility]) injection 5 Units  5 Units Subcutaneous QHS Carollee Herter, DO   5 Units at 05/21/22 2202   ipratropium-albuterol (DUONEB) 0.5-2.5 (3) MG/3ML nebulizer solution 3 mL  3 mL Nebulization Q4H PRN Amin, Loura Halt, MD       melatonin tablet 10 mg  10 mg Oral QHS PRN  Carollee Herter, DO   10 mg at 05/17/22 2055   menthol-cetylpyridinium (CEPACOL) lozenge 3 mg  1 lozenge Oral PRN Berton Mount I, MD   3 mg at 05/18/22 1155   metoCLOPramide (REGLAN) tablet 5 mg  5 mg Oral TID AC & HS Carollee Herter, DO   5 mg at 05/22/22 4782   metoprolol succinate (TOPROL-XL) 24 hr tablet 12.5 mg  12.5 mg Oral Daily Carollee Herter, DO   12.5 mg at 05/22/22 9562   metoprolol tartrate (LOPRESSOR) injection 5 mg  5 mg Intravenous Q4H PRN Dimple Nanas, MD       ondansetron (ZOFRAN) tablet 4 mg  4 mg Oral Q6H PRN Carollee Herter, DO   4 mg at 05/21/22 1739   Or   ondansetron (ZOFRAN) injection 4 mg  4 mg Intravenous Q6H PRN Carollee Herter, DO       pneumococcal 23 valent vaccine (PNEUMOVAX-23) injection 0.5 mL  0.5 mL Intramuscular Prior to discharge Barnetta Chapel, MD       polyethylene glycol (MIRALAX / GLYCOLAX) packet 17 g  17 g Oral Daily Carollee Herter, DO   17 g at 05/22/22 1308   rosuvastatin (CRESTOR) tablet 10 mg  10 mg Oral Daily Carollee Herter, DO   10 mg at 05/22/22 6578   senna-docusate (Senokot-S) tablet 1 tablet  1 tablet Oral QHS PRN Dimple Nanas, MD   1 tablet at 05/19/22 0518   sevelamer carbonate (RENVELA) tablet 800 mg  800 mg Oral TID WC Darnell Level, MD   800 mg at 05/22/22 4696   sodium bicarbonate tablet 1,300 mg  1,300 mg Oral BID Carollee Herter, DO   1,300 mg at 05/22/22 2952   tamsulosin (FLOMAX) capsule 0.4 mg  0.4 mg Oral QHS Carollee Herter, DO   0.4 mg at 05/21/22 2158   traZODone (DESYREL) tablet 50 mg  50 mg Oral QHS PRN Dimple Nanas, MD   50 mg at 05/20/22 2151      Review of Systems: 10 systems reviewed and negative except per interval history/subjective  Physical Exam: Vitals:   05/22/22 0810 05/22/22 0851  BP: (!) 153/87 (!) 158/76  Pulse: 87 95  Resp: 20 18  Temp: 98.3 F (36.8 C) 98.4 F (36.9 C)  SpO2: 99% 99%   Total I/O In: 240 [P.O.:240] Out: 300 [Urine:300]  Intake/Output Summary (Last 24 hours) at 05/22/2022 1039 Last data  filed at 05/22/2022 1000 Gross per 24 hour  Intake 480 ml  Output 1100 ml  Net -620 ml   Constitutional: well-appearing, no acute distress ENMT: ears and nose without scars or lesions, MMM CV: normal rate, no edema Respiratory: Bilateral chest rise, normal work of breathing Gastrointestinal: soft, non-tender, no palpable masses or hernias Skin: no visible lesions or rashes Psych: alert, judgement/insight appropriate, appropriate mood and affect   Test Results I personally reviewed new and old clinical labs and radiology tests Lab Results  Component Value Date   NA 133 (L) 05/22/2022   K 4.9 05/22/2022   CL 102 05/22/2022   CO2 18 (L) 05/22/2022   BUN 121 (H) 05/22/2022   CREATININE 8.34 (H) 05/22/2022   CALCIUM 8.1 (L) 05/22/2022   ALBUMIN 2.4 (L) 05/22/2022   PHOS 8.8 (H) 05/22/2022    CBC Recent Labs  Lab 05/15/22 1318 05/16/22 0032 05/17/22 0412 05/19/22 0219  WBC 5.3 6.9 4.6 5.4  NEUTROABS 3.2 5.0  --  3.1  HGB 11.2* 10.5* 9.7* 9.5*  HCT 34.3* 32.0* 28.7* 28.0*  MCV 90.5 89.6 87.5 88.9  PLT 268 274 250 241

## 2022-05-23 ENCOUNTER — Inpatient Hospital Stay (HOSPITAL_COMMUNITY): Payer: Medicaid Other

## 2022-05-23 DIAGNOSIS — N179 Acute kidney failure, unspecified: Secondary | ICD-10-CM | POA: Insufficient documentation

## 2022-05-23 DIAGNOSIS — N390 Urinary tract infection, site not specified: Secondary | ICD-10-CM | POA: Diagnosis not present

## 2022-05-23 DIAGNOSIS — E1122 Type 2 diabetes mellitus with diabetic chronic kidney disease: Secondary | ICD-10-CM | POA: Diagnosis not present

## 2022-05-23 DIAGNOSIS — N185 Chronic kidney disease, stage 5: Secondary | ICD-10-CM

## 2022-05-23 DIAGNOSIS — I5032 Chronic diastolic (congestive) heart failure: Secondary | ICD-10-CM | POA: Diagnosis not present

## 2022-05-23 HISTORY — PX: IR FLUORO GUIDE CV LINE RIGHT: IMG2283

## 2022-05-23 HISTORY — PX: IR US GUIDE VASC ACCESS RIGHT: IMG2390

## 2022-05-23 LAB — GLUCOSE, CAPILLARY
Glucose-Capillary: 133 mg/dL — ABNORMAL HIGH (ref 70–99)
Glucose-Capillary: 115 mg/dL — ABNORMAL HIGH (ref 70–99)
Glucose-Capillary: 144 mg/dL — ABNORMAL HIGH (ref 70–99)

## 2022-05-23 LAB — RENAL FUNCTION PANEL
Albumin: 2.4 g/dL — ABNORMAL LOW (ref 3.5–5.0)
Anion gap: 11 (ref 5–15)
BUN: 120 mg/dL — ABNORMAL HIGH (ref 8–23)
CO2: 20 mmol/L — ABNORMAL LOW (ref 22–32)
Calcium: 8 mg/dL — ABNORMAL LOW (ref 8.9–10.3)
Chloride: 103 mmol/L (ref 98–111)
Creatinine, Ser: 8.23 mg/dL — ABNORMAL HIGH (ref 0.61–1.24)
GFR, Estimated: 7 mL/min — ABNORMAL LOW (ref >60)
Glucose, Bld: 140 mg/dL — ABNORMAL HIGH (ref 70–99)
Phosphorus: 8.3 mg/dL — ABNORMAL HIGH (ref 2.5–4.6)
Potassium: 4.9 mmol/L (ref 3.5–5.1)
Sodium: 134 mmol/L — ABNORMAL LOW (ref 135–145)

## 2022-05-23 LAB — HEPATITIS B SURFACE ANTIBODY, QUANTITATIVE: Hep B S AB Quant (Post): 20.4 m[IU]/mL (ref >9.9)

## 2022-05-23 LAB — SURGICAL PCR SCREEN
MRSA, PCR: NEGATIVE
Staphylococcus aureus: NEGATIVE

## 2022-05-23 LAB — HEPATITIS B CORE ANTIBODY, TOTAL: Hep B Core Total Ab: NONREACTIVE

## 2022-05-23 MED ORDER — MIDAZOLAM HCL 2 MG/2ML IJ SOLN
INTRAMUSCULAR | Status: AC | PRN
  Administered 2022-05-23: 1 mg via INTRAVENOUS

## 2022-05-23 MED ORDER — CEFAZOLIN SODIUM-DEXTROSE 2-4 GM/100ML-% IV SOLN
INTRAVENOUS | Status: AC
  Filled 2022-05-23: qty 100

## 2022-05-23 MED ORDER — LIDOCAINE HCL 1 % IJ SOLN
INTRAMUSCULAR | Status: AC
  Filled 2022-05-23: qty 20

## 2022-05-23 MED ORDER — MIDAZOLAM HCL 2 MG/2ML IJ SOLN
INTRAMUSCULAR | Status: AC
  Filled 2022-05-23: qty 2

## 2022-05-23 MED ORDER — CEFAZOLIN SODIUM-DEXTROSE 2-4 GM/100ML-% IV SOLN
INTRAVENOUS | Status: AC | PRN
  Administered 2022-05-23: 2 g via INTRAVENOUS

## 2022-05-23 MED ORDER — CEFAZOLIN SODIUM-DEXTROSE 1-4 GM/50ML-% IV SOLN
1.0000 g | INTRAVENOUS | Status: AC
  Filled 2022-05-23 (×2): qty 50

## 2022-05-23 MED ORDER — FENTANYL CITRATE (PF) 100 MCG/2ML IJ SOLN
INTRAMUSCULAR | Status: AC | PRN
  Administered 2022-05-23: 25 ug via INTRAVENOUS
  Administered 2022-05-23: 50 ug via INTRAVENOUS

## 2022-05-23 MED ORDER — MUPIROCIN 2 % EX OINT
1.0000 | TOPICAL_OINTMENT | Freq: Two times a day (BID) | CUTANEOUS | Status: DC
  Administered 2022-05-23 – 2022-05-25 (×4): 1 via NASAL
  Filled 2022-05-23 (×2): qty 22

## 2022-05-23 MED ORDER — FENTANYL CITRATE (PF) 100 MCG/2ML IJ SOLN
INTRAMUSCULAR | Status: AC
  Filled 2022-05-23: qty 2

## 2022-05-23 MED ORDER — HEPARIN SODIUM (PORCINE) 1000 UNIT/ML IJ SOLN
INTRAMUSCULAR | Status: AC
  Filled 2022-05-23: qty 10

## 2022-05-23 MED ORDER — HEPARIN SODIUM (PORCINE) 1000 UNIT/ML DIALYSIS
1000.0000 [IU] | INTRAMUSCULAR | Status: DC | PRN
  Administered 2022-05-23: 3200 [IU] via INTRAVENOUS_CENTRAL
  Filled 2022-05-23: qty 1

## 2022-05-23 NOTE — Assessment & Plan Note (Addendum)
Nephrology on board. Baseline creatinine of 3.6-4.6  per nephrology. Creatinine of 5.40 on admission which has continued to worsen in setting of severe renal impairment.  TDC placed on 5/8.  Hemodialysis started on 5/8.  Right forearm fistula created on 5/9. Patient set up with outpatient HD on MWF.

## 2022-05-23 NOTE — Progress Notes (Signed)
   05/23/22 1835  Vitals  Temp 97.9 F (36.6 C)  Pulse Rate 81  Resp 18  BP (!) 151/75  SpO2 99 %  O2 Device Room Air  Weight 108.1 kg  Type of Weight Post-Dialysis  Post Treatment  Dialyzer Clearance Clear  Duration of HD Treatment -hour(s) 2 hour(s)  Hemodialysis Intake (mL) 0 mL  Liters Processed 30  Fluid Removed (mL) 0 mL  Tolerated HD Treatment Yes   Received patient in bed to unit.  Alert and oriented.  Informed consent signed and in chart.   TX duration:2.0----pts first tx  Patient tolerated well.  Transported back to the room  Alert, without acute distress.  Hand-off given to patient's nurse.   Access used: [facility] Access issues: no complications  Total UF removed: ran even per order Medication(s) given: none   [facility]

## 2022-05-23 NOTE — Progress Notes (Addendum)
Attempted to meet with pt at bedside to discuss need for out-pt HD at d/c. Pt off the unit. Will attempt to f/u later today.   Olivia Canter Renal Navigator [phone]  Addendum at 3:02 pm: Met with pt at bedside. Introduced self and explained role. Pt contacted pt's sister via phone to have her be a part of the conversation. Explained to pt that unfortunately pt's insurance is not accepted by [facility]. For this reason, pt will need a clinic outside of [place]. Discussed option of referral to [facility] in [facility] or [facility] in [place] or [place]. Pt and sister prefer [facility] in [facility] if possible. Contacted Beverly with [facility] out-pt HD. Pt's information faxed to [facility] for review for out-pt HD placement. Pt plans to use transportation through managed medicaid to get to/from HD appts. Pt's sister said that family can assist if needed as well. Update provided to nephrologist and RN CM. Will assist as needed.

## 2022-05-23 NOTE — Hospital Course (Signed)
Ryan Jenkins is a 62 y.o. male with a history of CKD stage V, diabetes mellitus type 2, hypertension, ESBL infection, GERD, hyperlipidemia, anemia of chronic kidney disease. Patient presented secondary to bilateral leg edema with associated AKI concerning for progressive CKD. Patient started on IV Lasix diuresis. Nephrology consulted. Patient also found to have ESBL Klebsiella pneumonia UTI, treated with meropenem. Nephrology recommendations for patient to transition to intermittent hemodialysis as an inpatient.

## 2022-05-23 NOTE — Consult Note (Addendum)
Hospital Consult    Reason for Consult:  dialysis access Requesting Physician:  Valentino Nose MRN #:  [id number]  History of Present Illness: This is a 62 y.o. 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  He is followed by Dr. Ronalee Belts.    Pt was seen in our office by Dr. Chestine Spore on 04/24/2022 and at that time, we were asked to place a fistula but not graft at that time.  His veins on the left were small and it was discussed proceeding with right arm fistula.  He was scheduled on 4/19, but pt called to cancel appt and would call to reschedule.   Pt presented to the hospital about a week ago with fluid retention/weight gain. He had Klebsiella on urine culture 4/19 and was admitted. He had been recently treated for UTI and was recurrent with difficult organisms to treat.  Over his hospitalization, his creatinine worsened and he has [facility] placement scheduled with IR and [facility] is consulted for access placement.   He has lots of questions about the process, catheter and access.  He denies any pain in his feet.  He has had some mild swelling in his legs.  He has neuropathy in his feet.    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    The pt is on a statin for cholesterol management.  The pt is not on a daily aspirin.   Other AC:  Plavix.  Sq heparin The pt is on CCB, hydralazine, diuretic, BB for hypertension.   The pt is diabetic.   Tobacco hx:  never  No family hx of AAA.  Past Medical History:  Diagnosis Date   Anemia    Anginal pain (HCC)    Anxiety    Arthritis    BPH with obstruction/lower urinary tract symptoms    CAD (coronary artery disease)    cardiologist--- dr Lucienne Minks badal;  11-06-2019 cardiac cath in [place] (result in care everywhere)  nonobstructive cad involing pRCA 60% (done in setting worseing CHF/ acute pulmonary edema requiring intubation/ AKI   Chronic combined systolic and diastolic CHF (congestive heart failure) (HCC)    Chronic kidney disease, stage IV (severe)  (HCC)    nephrologist--- dr Ronalee Belts   Depression    Diabetic neuropathy (HCC)    Dyspnea    Edema of both lower extremities    Gastric ulcer without hemorrhage or perforation 12/25/2018   GERD (gastroesophageal reflux disease)    Heart murmur    History of community acquired pneumonia    admission 06-04-2021 [facility]  w/ ARF hypoxia w/ severe sepsis   Hyperlipidemia    Hypertension    Insulin dependent type 2 diabetes mellitus (HCC)    LBBB (left bundle branch block)    Pneumonia    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)    Uses walker    Vitamin B12 deficiency    Vitreous hemorrhage [facility])     Past Surgical History:  Procedure Laterality Date   CARDIAC CATHETERIZATION  11/06/2019   [facility] in [place];    nonobstructive CAD , pRCA 60% (result in care everywhere)   CATARACT EXTRACTION W/ INTRAOCULAR LENS IMPLANT Bilateral 2017   TRANSURETHRAL RESECTION OF PROSTATE N/A 10/16/2021   Procedure: TRANSURETHRAL RESECTION OF THE PROSTATE (TURP);  Surgeon: Jannifer Hick, MD;  Location: [facility];  Service: Urology;  Laterality: N/A;    Allergies  Allergen Reactions   Claritin [Loratadine] Swelling and  Other (See Comments)    Joint swelling    Hydrochlorothiazide Other (See Comments)    Dizziness   Latex Hives and Swelling   Lyrica [Pregabalin] Other (See Comments)    Depression. "Makes me loopy"    Metformin And Related Diarrhea and Nausea And Vomiting    Prior to Admission medications   Medication Sig Start Date End Date Taking? Authorizing Provider  amLODipine (NORVASC) 10 MG tablet Take 1 tablet (10 mg total) by mouth daily. 02/08/21  Yes Danford, Earl Lites, MD  Cholecalciferol (VITAMIN D3) 1.25 MG (50000 UT) CAPS Take 50,000 Units by mouth every Friday. 09/12/21  Yes [provider]  clopidogrel (PLAVIX) 75 MG tablet Take 75 mg by mouth daily. 05/10/22  Yes [provider]  doxycycline (VIBRAMYCIN) 100 MG capsule Take 100 mg by mouth 2  (two) times daily. Unsure if he's taking TID 04/20/22  Yes [provider]  famotidine (PEPCID) 10 MG tablet Take 1 tablet (10 mg total) by mouth daily. 04/09/22 05/15/22 Yes Zigmund Daniel., MD  finasteride (PROSCAR) 5 MG tablet Take 1 tablet (5 mg total) by mouth daily. 07/20/20  Yes Hoy Register, MD  furosemide (LASIX) 80 MG tablet Take 1 tablet (80 mg total) by mouth 2 (two) times daily. 03/29/22 06/27/22 Yes Dahal, Melina Schools, MD  gabapentin (NEURONTIN) 300 MG capsule Take 300 mg by mouth 3 (three) times daily. 04/27/22  Yes [provider]  glipiZIDE (GLUCOTROL) 5 MG tablet Take 5 mg by mouth daily. 04/25/22  Yes [provider]  hydrALAZINE (APRESOLINE) 100 MG tablet Take 100 mg by mouth 3 (three) times daily. 04/25/22  Yes [provider]  LANTUS SOLOSTAR 100 UNIT/ML Solostar Pen Inject 5 Units into the skin at bedtime. 03/29/22  Yes Dahal, Melina Schools, MD  lisinopril (ZESTRIL) 10 MG tablet Take 10 mg by mouth daily. 04/27/22  Yes [provider]  metoCLOPramide (REGLAN) 5 MG tablet Take 1 tablet (5 mg total) by mouth 4 (four) times daily -  before meals and at bedtime for 14 days. After 2 weeks, follow up with your PCP for further recommendations on how to take.  If your nausea improves prior to completing the 2 week course, you can use before meals and at bedtime only as needed. 04/09/22 05/15/22 Yes Zigmund Daniel., MD  metoprolol succinate (TOPROL-XL) 25 MG 24 hr tablet Take 0.5 tablets (12.5 mg total) by mouth daily. 03/30/22 06/28/22 Yes Dahal, Melina Schools, MD  oxyCODONE (OXY IR/ROXICODONE) 5 MG immediate release tablet Take 5 mg by mouth every 8 (eight) hours as needed (for pain).   Yes [provider]  polyethylene glycol (MIRALAX / GLYCOLAX) 17 g packet Take 17 g by mouth daily. 03/30/22  Yes Dahal, Melina Schools, MD  potassium chloride (KLOR-CON M) 10 MEQ tablet Take 10 mEq by mouth daily. 04/25/22  Yes [provider]  rosuvastatin (CRESTOR) 10  MG tablet Take 10 mg by mouth daily.   Yes [provider]  sodium bicarbonate 650 MG tablet Take 1,300 mg by mouth 2 (two) times daily. 09/20/21  Yes [provider]  tamsulosin (FLOMAX) 0.4 MG CAPS capsule Take 0.4 mg by mouth at bedtime. 05/25/21  Yes [provider]  triamcinolone ointment (KENALOG) 0.5 % Apply 1 Application topically 2 (two) times daily as needed (for itching). 03/27/22  Yes [provider]  TYLENOL 500 MG tablet Take 500-1,000 mg by mouth every 6 (six) hours as needed for mild pain or headache.   Yes [provider]  meclizine (ANTIVERT) 25 MG tablet Take 25 mg by mouth daily as needed for dizziness. 08/08/21   [provider]  senna-docusate (SENOKOT-S) 8.6-50 MG tablet Take 1 tablet by mouth at bedtime. 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, Archivist  Tobacco Use   Smoking status: Never    Passive exposure: Never   Smokeless tobacco: Never  Vaping Use   Vaping Use: Never used  Substance and Sexual Activity   Alcohol use: No    Alcohol/week: 0.0 standard drinks of alcohol   Drug use: No   Sexual activity: Yes  Other Topics Concern   Not on file  Social History Narrative   Not on file   Social Determinants of Health   Financial Resource Strain: Not on file  Food Insecurity: No Food Insecurity (05/15/2022)   Hunger Vital Sign    Worried About Running Out of Food in the Last Year: Never true    Ran Out of Food in the Last Year: Never true  Transportation Needs: No Transportation Needs (05/15/2022)   PRAPARE - Administrator, Civil Service (Medical): No    Lack of Transportation (Non-Medical): No   Physical Activity: Not on file  Stress: Not on file  Social Connections: Not on file  Intimate Partner Violence: Not At Risk (05/15/2022)   Humiliation, Afraid, Rape, and Kick questionnaire    Fear of Current or Ex-Partner: No    Emotionally Abused: No    Physically Abused: No    Sexually Abused: No     Family History  Problem Relation Age of Onset   Renal cancer Mother    Hypertension Mother    Pancreatic cancer Mother    Hypertension Sister    Stroke Sister    Leukemia Maternal Uncle    Sickle cell trait Maternal Aunt    Colon cancer Neg Hx    Esophageal cancer Neg Hx    Rectal cancer Neg Hx    Stomach cancer Neg Hx     ROS: [x]  Positive   [ ]  Negative   [ ]  All sytems reviewed and are negative  Cardiac: [x]  HTN [x]  HLD [x]  heart failure [x]  CAD  Vascular: []  pain in legs while walking []  non-healing ulcers []  hx of DVT [x]  swelling in legs  Pulmonary: []  asthma/wheezing []  home O2  Neurologic: []  hx of CVA []  mini stroke   Hematologic: []  hx of cancer  Endocrine:   [x]  diabetes []  thyroid disease  GI [x]  GERD [x]  hx gastric ulcer   GU: [x]  CKD/renal failure []  HD--[]  M/W/F or []  T/T/S [x]  BPH-hx TURP  Psychiatric: [x]  anxiety [x]  depression  Musculoskeletal: [x]  arthritis []  joint pain  Integumentary: []  rashes []  ulcers  Constitutional: []  fever  []  chills   Physical Examination  Vitals:   05/23/22 0526 05/23/22 0856  BP: (!) 148/73 (!) 143/80  Pulse: 85 90  Resp: 15 16  Temp: 98.3 F (36.8 C) 98.3 F (36.8 C)  SpO2: 99% 99%   Body mass index is 30.6 kg/m.  General:  WDWN in NAD Gait: Not observed HENT: WNL, normocephalic Pulmonary: normal non-labored  breathing Cardiac: regular Abdomen:  soft, NT/ND Skin: no rashes Vascular Exam/Pulses:  Right Left  Radial 2+ (normal) 2+ (normal)   Extremities: without ischemic changes, without Gangrene , without cellulitis; without open wounds; mild BLE edema; sensory not  intact bilateral feet. Musculoskeletal: no muscle wasting or atrophy  Neurologic: A&O X 3; speech is fluent/normal Psychiatric:  The pt has Normal affect.   CBC    Component Value Date/Time   WBC 5.4 05/19/2022 0219   RBC 3.15 (L) 05/19/2022 0219   HGB 9.5 (L) 05/19/2022 0219   HGB 10.9 (L) 07/13/2020 1208   HCT 28.0 (L) 05/19/2022 0219   HCT 34.6 (L) 07/13/2020 1208   PLT 241 05/19/2022 0219   PLT 395 07/13/2020 1208   MCV 88.9 05/19/2022 0219   MCV 94 07/13/2020 1208   MCH 30.2 05/19/2022 0219   MCHC 33.9 05/19/2022 0219   RDW 15.1 05/19/2022 0219   RDW 15.1 07/13/2020 1208   LYMPHSABS 1.2 05/19/2022 0219   LYMPHSABS 2.6 08/07/2017 1424   MONOABS 0.8 05/19/2022 0219   EOSABS 0.2 05/19/2022 0219   EOSABS 0.2 08/07/2017 1424   BASOSABS 0.0 05/19/2022 0219   BASOSABS 0.0 08/07/2017 1424    BMET    Component Value Date/Time   NA 134 (L) 05/23/2022 0208   NA 139 07/13/2020 1208   K 4.9 05/23/2022 0208   CL 103 05/23/2022 0208   CO2 20 (L) 05/23/2022 0208   GLUCOSE 140 (H) 05/23/2022 0208   BUN 120 (H) 05/23/2022 0208   BUN 25 07/13/2020 1208   CREATININE 8.23 (H) 05/23/2022 0208   CALCIUM 8.0 (L) 05/23/2022 0208   GFRNONAA 7 (L) 05/23/2022 0208   GFRAA 45 (L) 08/07/2019 1215    COAGS: Lab Results  Component Value Date   INR 1.0 07/09/2021   INR 1.0 07/03/2021     Non-Invasive Vascular Imaging:   BUE Vein Mapping on 04/23/2022: +-----------------+-------------+----------+--------------+  Right Cephalic   Diameter (cm)Depth (cm)   Findings     +-----------------+-------------+----------+--------------+  Shoulder            0.26                               +-----------------+-------------+----------+--------------+  Prox upper arm       0.26                               +-----------------+-------------+----------+--------------+  Mid upper arm        0.36                                +-----------------+-------------+----------+--------------+  Dist upper arm       0.35                               +-----------------+-------------+----------+--------------+  Antecubital fossa    0.21                 branching     +-----------------+-------------+----------+--------------+  Prox forearm         0.09                               +-----------------+-------------+----------+--------------+  Mid forearm  0.09                               +-----------------+-------------+----------+--------------+  Dist forearm                            not visualized  +-----------------+-------------+----------+--------------+   +-----------------+-------------+----------+--------------+  Right Basilic    Diameter (cm)Depth (cm)   Findings     +-----------------+-------------+----------+--------------+  Mid upper arm        0.37                               +-----------------+-------------+----------+--------------+  Dist upper arm       0.32                               +-----------------+-------------+----------+--------------+  Antecubital fossa    0.38                               +-----------------+-------------+----------+--------------+  Prox forearm         0.21                               +-----------------+-------------+----------+--------------+  Mid forearm                             not visualized  +-----------------+-------------+----------+--------------+   +-----------------+-------------+----------+---------+  Left Cephalic    Diameter (cm)Depth (cm)Findings   +-----------------+-------------+----------+---------+  Shoulder            0.19                          +-----------------+-------------+----------+---------+  Prox upper arm       0.19                          +-----------------+-------------+----------+---------+  Mid upper arm        0.28                           +-----------------+-------------+----------+---------+  Dist upper arm       0.26                          +-----------------+-------------+----------+---------+  Antecubital fossa    0.25                          +-----------------+-------------+----------+---------+  Prox forearm         0.17                          +-----------------+-------------+----------+---------+  Mid forearm          0.14               branching  +-----------------+-------------+----------+---------+  Dist forearm         0.13                          +-----------------+-------------+----------+---------+   +-----------------+-------------+----------+--------+  Left Basilic  Diameter (cm)Depth (cm)Findings  +-----------------+-------------+----------+--------+  Mid upper arm        0.29                         +-----------------+-------------+----------+--------+  Dist upper arm       0.37                         +-----------------+-------------+----------+--------+  Antecubital fossa    0.22                         +-----------------+-------------+----------+--------+  Prox forearm         0.18                         +-----------------+-------------+----------+--------+    ASSESSMENT/PLAN: This is a 62 y.o. male with AKI on CKD 5 in need of permanent dialysis access.  He was seen by Dr. Chestine Spore on 04/24/2022 with plans for right arm fistula.  He is scheduled for [facility] with IR.   -pt is right hand dominant.  Vein mapping last month revealed right basilic vein would be best option.  He is getting [facility] today with IR.   -will tentatively plan for right 1st stage BVT on Thursday with Dr. Karin Lieu -will restrict right upper extremity-discussed with pt not to let anyone stick needles in the right arm.  -discussed with pt that access may require additional interventions or even new access in the future as dialysis access does not last forever.   -Dr. Chestine Spore to see pt  later today and discuss further.   -If pt is scheduled for surgery tomorrow, will need to schedule his HD around OR time.  -pt is on sq heparin   Doreatha Massed, PA-C [facility] [phone]   I have seen and evaluated the patient. I agree with the PA note as documented above.  62 year old male admitted with AKI on CKD 5 now with ESRD also with UTI that vascular surgery has been consulted for permanent HD access.  He was previously seen in the office and we offered him a right arm AV fistula as he had usable vein in the right arm (left arm vein was small) even though he is right arm dominant.  Now in IR getting TDC.  Will tentatively post tomorrow for right arm AV fistula with Dr. Karin Lieu.  I will follow-up with patient later this afternoon to further discuss.  Please keep n.p.o. after midnight.  Will order consent form.  Cephus Shelling, MD [facility] [place] Office: [phone]

## 2022-05-23 NOTE — Progress Notes (Signed)
Nephrology Follow-Up Consult note   Assessment/Recommendations: Ryan Jenkins is a/an 62 y.o. male with a past medical history significant for CKD 5, DM2, HTN, recurrent MDR UTI, admitted for AKI/urinary retention, lower extremity edema.       AKI on CKD 5: Baseline creatinine around 3.9-4.6.  Follows with Dr. Ronalee Belts.  Presented with lower extremity edema and underwent diuresis.  Likely over diuresis plus some AKI related to infection and obstruction.  Also with some advancement of disease.  Discussed with primary nephrologist.  Patient has not been very compliant and he is worried he likely now has ESRD.  Will pursue dialysis planning -IR for [facility], appreciate help -Consult [facility] for AVF/AVG -Start dialysis today after access is obtained.  Continue dialysis daily for 3 days -Start clip -Holding Lasix and ACE inhibitor -Continue to monitor daily Cr, Dose meds for GFR -Monitor Daily I/Os, Daily weight  -Maintain MAP>65 for optimal renal perfusion.  -Avoid nephrotoxic medications including NSAIDs -Use synthetic opioids (Fentanyl/Dilaudid) if needed  ESBL Klebsiella UTI: Antibiotics per primary team.  Hypertension: Holding ACE inhibitor.  Continue amlodipine and beta-blocker.  Continue to monitor  Anemia due to CKD/chronic inflammation; continue to monitor and consider ESA  Hyponatremia: Minimal abnormality likely associated with AKI on CKD.  Dialysis as above  Metabolic acidosis: Associated with AKI.  Minimal.  Likely will improve with dialysis  Hyperphosphatemia: Continue sevelamer.  Dialysis as above   Recommendations conveyed to primary service.    Darnell Level [facility] 05/23/2022 9:45 AM  ___________________________________________________________  CC: AKI  Interval History/Subjective: Patient states he feels well today.  Denies shortness of breath, chest pain, nausea, vomiting.  Planning for dialysis catheter today   Medications:  Current  Facility-Administered Medications  Medication Dose Route Frequency Provider Last Rate Last Admin   acetaminophen (TYLENOL) tablet 650 mg  650 mg Oral Q6H PRN Carollee Herter, DO   650 mg at 05/22/22 1191   Or   acetaminophen (TYLENOL) suppository 650 mg  650 mg Rectal Q6H PRN Carollee Herter, DO       amLODipine (NORVASC) tablet 10 mg  10 mg Oral Daily Carollee Herter, DO   10 mg at 05/23/22 0857   Chlorhexidine Gluconate Cloth 2 % PADS 6 each  6 each Topical Q0600 Darnell Level, MD   6 each at 05/23/22 0534   clopidogrel (PLAVIX) tablet 75 mg  75 mg Oral Daily Carollee Herter, DO   75 mg at 05/23/22 0857   famotidine (PEPCID) tablet 10 mg  10 mg Oral Daily Carollee Herter, DO   10 mg at 05/23/22 0858   finasteride (PROSCAR) tablet 5 mg  5 mg Oral Daily Carollee Herter, DO   5 mg at 05/23/22 0858   gabapentin (NEURONTIN) capsule 300 mg  300 mg Oral Daily Leandro Reasoner Tublu, MD   300 mg at 05/23/22 0857   guaiFENesin (ROBITUSSIN) 100 MG/5ML liquid 5 mL  5 mL Oral Q4H PRN Dimple Nanas, MD       heparin injection 5,000 Units  5,000 Units Subcutaneous Q8H Carollee Herter, DO   5,000 Units at 05/23/22 0534   hydrALAZINE (APRESOLINE) injection 10 mg  10 mg Intravenous Q4H PRN Amin, Ankit Chirag, MD       hydrALAZINE (APRESOLINE) tablet 100 mg  100 mg Oral TID Carollee Herter, DO   100 mg at 05/23/22 0858   insulin aspart (novoLOG) injection 0-5 Units  0-5 Units Subcutaneous QHS Carollee Herter, DO   2 Units at 05/17/22  2101   insulin aspart (novoLOG) injection 0-9 Units  0-9 Units Subcutaneous TID WC Carollee Herter, DO   1 Units at 05/23/22 0900   insulin glargine-yfgn (SEMGLEE) injection 5 Units  5 Units Subcutaneous QHS Carollee Herter, DO   5 Units at 05/22/22 2327   ipratropium-albuterol (DUONEB) 0.5-2.5 (3) MG/3ML nebulizer solution 3 mL  3 mL Nebulization Q4H PRN Amin, Ankit Chirag, MD       melatonin tablet 10 mg  10 mg Oral QHS PRN Carollee Herter, DO   10 mg at 05/17/22 2055   menthol-cetylpyridinium (CEPACOL) lozenge 3 mg  1 lozenge  Oral PRN Berton Mount I, MD   3 mg at 05/18/22 1155   metoCLOPramide (REGLAN) tablet 5 mg  5 mg Oral TID AC & HS Carollee Herter, DO   5 mg at 05/23/22 0857   metoprolol succinate (TOPROL-XL) 24 hr tablet 12.5 mg  12.5 mg Oral Daily Carollee Herter, DO   12.5 mg at 05/23/22 0857   metoprolol tartrate (LOPRESSOR) injection 5 mg  5 mg Intravenous Q4H PRN Amin, Ankit Chirag, MD       ondansetron (ZOFRAN) tablet 4 mg  4 mg Oral Q6H PRN Carollee Herter, DO   4 mg at 05/21/22 1739   Or   ondansetron (ZOFRAN) injection 4 mg  4 mg Intravenous Q6H PRN Carollee Herter, DO       pneumococcal 23 valent vaccine (PNEUMOVAX-23) injection 0.5 mL  0.5 mL Intramuscular Prior to discharge Berton Mount I, MD       polyethylene glycol (MIRALAX / GLYCOLAX) packet 17 g  17 g Oral Daily Carollee Herter, DO   17 g at 05/22/22 1478   rosuvastatin (CRESTOR) tablet 10 mg  10 mg Oral Daily Carollee Herter, DO   10 mg at 05/23/22 0857   senna-docusate (Senokot-S) tablet 1 tablet  1 tablet Oral QHS PRN Dimple Nanas, MD   1 tablet at 05/19/22 0518   sevelamer carbonate (RENVELA) tablet 800 mg  800 mg Oral TID WC Darnell Level, MD   800 mg at 05/22/22 1709   sodium bicarbonate tablet 1,300 mg  1,300 mg Oral BID Carollee Herter, DO   1,300 mg at 05/23/22 0857   tamsulosin (FLOMAX) capsule 0.4 mg  0.4 mg Oral QHS Carollee Herter, DO   0.4 mg at 05/22/22 2255   traZODone (DESYREL) tablet 50 mg  50 mg Oral QHS PRN Dimple Nanas, MD   50 mg at 05/20/22 2151      Review of Systems: 10 systems reviewed and negative except per interval history/subjective  Physical Exam: Vitals:   05/23/22 0526 05/23/22 0856  BP: (!) 148/73 (!) 143/80  Pulse: 85 90  Resp: 15 16  Temp: 98.3 F (36.8 C) 98.3 F (36.8 C)  SpO2: 99% 99%   No intake/output data recorded.  Intake/Output Summary (Last 24 hours) at 05/23/2022 0945 Last data filed at 05/23/2022 2956 Gross per 24 hour  Intake 240 ml  Output 1840 ml  Net -1600 ml   Constitutional: well-appearing,  no acute distress ENMT: ears and nose without scars or lesions, MMM CV: normal rate, no edema Respiratory: Bilateral chest rise, normal work of breathing Gastrointestinal: soft, non-tender, no palpable masses or hernias Skin: no visible lesions or rashes Psych: alert, judgement/insight appropriate, appropriate mood and affect   Test Results I personally reviewed new and old clinical labs and radiology tests Lab Results  Component Value Date   NA 134 (L) 05/23/2022   K  4.9 05/23/2022   CL 103 05/23/2022   CO2 20 (L) 05/23/2022   BUN 120 (H) 05/23/2022   CREATININE 8.23 (H) 05/23/2022   CALCIUM 8.0 (L) 05/23/2022   ALBUMIN 2.4 (L) 05/23/2022   PHOS 8.3 (H) 05/23/2022    CBC Recent Labs  Lab 05/17/22 0412 05/19/22 0219  WBC 4.6 5.4  NEUTROABS  --  3.1  HGB 9.7* 9.5*  HCT 28.7* 28.0*  MCV 87.5 88.9  PLT 250 241

## 2022-05-23 NOTE — Procedures (Signed)
Interventional Radiology Procedure Note  Procedure: Tunneled HD catheter placement  Complications: None  Estimated Blood Loss: < 10 mL  Findings: Right IJ 19 cm tip to cuff length Palindrome catheter placed with tip in upper RA. OK to use.  Jodi Marble. Fredia Sorrow, M.D Pager:  [phone]

## 2022-05-23 NOTE — Progress Notes (Signed)
I returned to see the patient after his right IJ TDC was placed.  We discussed right arm AV fistula access tomorrow in the operating room with Dr. Karin Lieu.  He has better vein in the right arm based on vein mapping.  Risk benefits discussed including with the sister on the phone.  Patient wishes to proceed.  Cephus Shelling, MD [facility] [place] Office: [phone]   Cephus Shelling

## 2022-05-23 NOTE — Plan of Care (Signed)
Patient AOX4, VSS throughout shift.  All meds given on time as ordered.  Diminished lungs, IS taught and encouraged.  CHG bath given.  Pt remains NPO since 12am.  Pt voided in urinal, bladder scan this morning but pt then voided .  Provider notified for I&O cath order parameters which were placed.  POC maintained, will continue to monitor.  Problem: Education: Goal: Ability to describe self-care measures that may prevent or decrease complications (Diabetes Survival Skills Education) will improve Outcome: Progressing Goal: Individualized Educational Video(s) Outcome: Progressing   Problem: Coping: Goal: Ability to adjust to condition or change in health will improve Outcome: Progressing   Problem: Fluid Volume: Goal: Ability to maintain a balanced intake and output will improve Outcome: Progressing   Problem: Health Behavior/Discharge Planning: Goal: Ability to identify and utilize available resources and services will improve Outcome: Progressing Goal: Ability to manage health-related needs will improve Outcome: Progressing   Problem: Metabolic: Goal: Ability to maintain appropriate glucose levels will improve Outcome: Progressing   Problem: Nutritional: Goal: Maintenance of adequate nutrition will improve Outcome: Progressing Goal: Progress toward achieving an optimal weight will improve Outcome: Progressing   Problem: Skin Integrity: Goal: Risk for impaired skin integrity will decrease Outcome: Progressing   Problem: Tissue Perfusion: Goal: Adequacy of tissue perfusion will improve Outcome: Progressing   Problem: Education: Goal: Knowledge of General Education information will improve Description: Including pain rating scale, medication(s)/side effects and non-pharmacologic comfort measures Outcome: Progressing   Problem: Health Behavior/Discharge Planning: Goal: Ability to manage health-related needs will improve Outcome: Progressing   Problem: Clinical  Measurements: Goal: Ability to maintain clinical measurements within normal limits will improve Outcome: Progressing Goal: Will remain free from infection Outcome: Progressing Goal: Diagnostic test results will improve Outcome: Progressing Goal: Respiratory complications will improve Outcome: Progressing Goal: Cardiovascular complication will be avoided Outcome: Progressing   Problem: Activity: Goal: Risk for activity intolerance will decrease Outcome: Progressing   Problem: Nutrition: Goal: Adequate nutrition will be maintained Outcome: Progressing   Problem: Coping: Goal: Level of anxiety will decrease Outcome: Progressing   Problem: Elimination: Goal: Will not experience complications related to bowel motility Outcome: Progressing Goal: Will not experience complications related to urinary retention Outcome: Progressing   Problem: Pain Managment: Goal: General experience of comfort will improve Outcome: Progressing   Problem: Safety: Goal: Ability to remain free from injury will improve Outcome: Progressing   Problem: Skin Integrity: Goal: Risk for impaired skin integrity will decrease Outcome: Progressing   Problem: Urinary Elimination: Goal: Signs and symptoms of infection will decrease Outcome: Progressing

## 2022-05-23 NOTE — Progress Notes (Signed)
Mobility Specialist Progress Note   05/23/22 1026  Mobility  Activity Ambulated with assistance in hallway  Level of Assistance Minimal assist, patient does 75% or more  Distance Ambulated (ft) 84 ft  Activity Response Tolerated well  Mobility Referral Yes  $Mobility charge 1 Mobility  Mobility Specialist Start Time (ACUTE ONLY) 1010  Mobility Specialist Stop Time (ACUTE ONLY) 1025  Mobility Specialist Time Calculation (min) (ACUTE ONLY) 15 min   Received in bed c/o fatigue but agreeable. Stand by assist to EOB but minA to stand from an elevated surface. Session limited by increased fatigue today, pt requesting to cut ambulation short expressing LE's about to give out. No buckling witnessed, returned back to bed w/o incident. Call bell in reach, bed alarm on and RN notified.    Frederico Hamman Mobility Specialist Please contact via SecureChat or  Rehab office at [phone]

## 2022-05-23 NOTE — TOC Progression Note (Signed)
Transition of Care [facility]) - Progression Note    Patient Details  Name: Ryan Jenkins MRN: [id number] Date of Birth: August 27, 1960  [facility]) CM/SW Contact  Tom-Johnson, Hershal Coria, RN Phone Number: 05/23/2022, 3:32 PM  Clinical Narrative:     Patient to start new HD in patient. Underwent Rt IJ placement today 05/23/22 and scheduled for AVF placement tomorrow 05/24/22. Vascular and Nephrology following.  Clipping underway for outpatient dialysis. CM will continue to follow as patient progresses with care towards discharge.      Expected Discharge Plan: Home/Self Care Barriers to Discharge: Continued Medical Work up  Expected Discharge Plan and Services   Discharge Planning Services: CM Consult Post Acute Care Choice: NA Living arrangements for the past 2 months: Apartment                 DME Arranged: N/A DME Agency: NA       HH Arranged: PT, RN (Active with [facility] HH Agency: [facility] Date HH Agency Contacted: 05/16/22 Time HH Agency Contacted: 1415 Representative spoke with at [facility] Agency: Brandi   Social Determinants of Health (SDOH) Interventions SDOH Screenings   Food Insecurity: No Food Insecurity (05/15/2022)  Housing: Low Risk  (05/15/2022)  Transportation Needs: No Transportation Needs (05/15/2022)  Utilities: Not At Risk (05/15/2022)  Depression (PHQ2-9): Medium Risk (08/07/2019)  Tobacco Use: Low Risk  (05/23/2022)    Readmission Risk Interventions    05/18/2022    9:11 AM 04/05/2022   11:10 AM 03/26/2022    5:46 PM  Readmission Risk Prevention Plan  Transportation Screening Complete Complete Complete  Medication Review (RN Care Manager) Referral to Pharmacy Complete Referral to Pharmacy  PCP or Specialist appointment within 3-5 days of discharge Complete Complete   HRI or Home Care Consult Complete Complete Complete  SW Recovery Care/Counseling Consult Complete Complete Complete  Palliative Care Screening Not  Applicable Not Applicable Not Applicable  Skilled Nursing Facility Not Applicable Not Applicable Not Applicable

## 2022-05-23 NOTE — Progress Notes (Signed)
PROGRESS NOTE    JACARION PRIMAVERA  ZOX:[id number] DOB: 03-Jul-1960 DOA: 05/15/2022 PCP: Malka So., MD   Brief Narrative: Ryan Jenkins is a 62 y.o. male with a history of CKD stage V, diabetes mellitus type 2, hypertension, ESBL infection, GERD, hyperlipidemia, anemia of chronic kidney disease. Patient presented secondary to bilateral leg edema with associated AKI concerning for progressive CKD. Patient started on IV Lasix diuresis. Nephrology consulted. Patient also found to have ESBL Klebsiella pneumonia UTI, treated with meropenem. Nephrology recommendations for patient to transition to intermittent hemodialysis as an inpatient.   Assessment and Plan: * Urinary tract infection due to ESBL Klebsiella-resolved as of 05/23/2022 Urine culture significant for ESBL Klebsiella pneumoniae sensitive to imipenem. Patient completed a 5 day course of meropenem.  CKD (chronic kidney disease) stage 5, GFR less than 15 ml/min (HCC) Baseline creatinine of 3.6-4.6 per nephrology notes. Patient follows with nephrology and had plans prior to admission for AVF vs AVG placement per vascular surgery.  Chronic diastolic heart failure (HCC) Stable. Fluid management will transition to per hemodialysis.  Anemia in chronic kidney disease (CKD) Chronic. Stable.  Gastroesophageal reflux disease without esophagitis -Continue famotidine  HLD (hyperlipidemia) -Continue Crestor 10 mg daily  Essential hypertension Mostly controlled. Patient is managed on lisinopril, amlodipine and metoprolol as an outpatient. Lisinopril discontinued this admission. -Continue amlodipine and metoprolol succinate  DM2 (diabetes mellitus, type 2) (HCC) Uncontrolled with hyperglycemia. Last hemoglobin A1C is improved to 7.4% from 8.4%. Patient is managed on glipizide and Lantus as an outpatient. -Continue Semglee in SSI while inpatient  AKI (acute kidney injury) [facility]) Nephrology on board. Baseline creatinine of 3.6-4.6   per nephrology. Creatinine of 5.40 on admission which has continued to worsen in setting of severe renal impairment. Plan to initiate hemodialysis as an inpatient -Nephrology recommendations: Vascular surgery for [facility] placement     DVT prophylaxis: Heparin subq Code Status:   Code Status: Full Code Family Communication: None at bedside Disposition Plan: Discharge home likely in 2-4 days pending outpatient HD placement and continued nephrology recommendations for inpatient management   Consultants:  Nephrology Vascular surgery  Procedures:  None  Antimicrobials: Meropenem    Subjective: Patient without specific concerns today. Awaiting dialysis catheter placement.  Objective: BP (!) 143/80 (BP Location: Right Arm)   Pulse 90   Temp 98.3 F (36.8 C) (Oral)   Resp 16   Ht 6\' 2"  (1.88 m)   Wt 108.1 kg   SpO2 99%   BMI 30.60 kg/m   Examination:  General exam: Appears calm and comfortable Respiratory system: Clear to auscultation. Respiratory effort normal. Cardiovascular system: S1 & S2 heard, RRR. No murmurs, rubs, gallops or clicks. Gastrointestinal system: Abdomen is nondistended, soft and nontender. No organomegaly or masses felt. Normal bowel sounds heard. Central nervous system: Alert and oriented. No focal neurological deficits. Musculoskeletal: 1+ BLE pitting edema. No calf tenderness Skin: No cyanosis. No rashes Psychiatry: Judgement and insight appear normal. Mood & affect appropriate.    Data Reviewed: I have personally reviewed following labs and imaging studies  CBC Lab Results  Component Value Date   WBC 5.4 05/19/2022   RBC 3.15 (L) 05/19/2022   HGB 9.5 (L) 05/19/2022   HCT 28.0 (L) 05/19/2022   MCV 88.9 05/19/2022   MCH 30.2 05/19/2022   PLT 241 05/19/2022   MCHC 33.9 05/19/2022   RDW 15.1 05/19/2022   LYMPHSABS 1.2 05/19/2022   MONOABS 0.8 05/19/2022   EOSABS 0.2 05/19/2022   BASOSABS 0.0  05/19/2022     Last metabolic panel Lab  Results  Component Value Date   NA 134 (L) 05/23/2022   K 4.9 05/23/2022   CL 103 05/23/2022   CO2 20 (L) 05/23/2022   BUN 120 (H) 05/23/2022   CREATININE 8.23 (H) 05/23/2022   GLUCOSE 140 (H) 05/23/2022   GFRNONAA 7 (L) 05/23/2022   GFRAA 45 (L) 08/07/2019   CALCIUM 8.0 (L) 05/23/2022   PHOS 8.3 (H) 05/23/2022   PROT 6.2 (L) 05/16/2022   ALBUMIN 2.4 (L) 05/23/2022   LABGLOB 3.5 12/25/2018   AGRATIO 1.2 12/25/2018   BILITOT 0.5 05/16/2022   ALKPHOS 60 05/16/2022   AST 13 (L) 05/16/2022   ALT 11 05/16/2022   ANIONGAP 11 05/23/2022    GFR: Estimated Creatinine Clearance: 12.3 mL/min (A) (by C-G formula based on SCr of 8.23 mg/dL (H)).  Recent Results (from the past 240 hour(s))  Urine Culture     Status: Abnormal   Collection Time: 05/15/22  4:51 PM   Specimen: Urine, Clean Catch  Result Value Ref Range Status   Specimen Description URINE, CLEAN CATCH  Final   Special Requests NONE Reflexed from [id number]  Final   Culture (A)  Final    >=100,000 COLONIES/mL KLEBSIELLA PNEUMONIAE MULTI-DRUG RESISTANT ORGANISM CRITICAL RESULT CALLED TO, READ BACK BY AND VERIFIED WITH: PHARMD ADRIENNE Vincente Poli [id number] AT 1950 BY EC Performed at [facility], [address], [place], [place] [address]    Report Status 05/18/2022 FINAL  Final   Organism ID, Bacteria KLEBSIELLA PNEUMONIAE (A)  Final      Susceptibility   Klebsiella pneumoniae - MIC*    AMPICILLIN >=32 RESISTANT Resistant     CEFAZOLIN >=64 RESISTANT Resistant     CEFEPIME >=32 RESISTANT Resistant     CEFTRIAXONE >=64 RESISTANT Resistant     CIPROFLOXACIN >=4 RESISTANT Resistant     GENTAMICIN <=1 SENSITIVE Sensitive     IMIPENEM <=0.25 SENSITIVE Sensitive     NITROFURANTOIN >=512 RESISTANT Resistant     TRIMETH/SULFA >=320 RESISTANT Resistant     AMPICILLIN/SULBACTAM >=32 RESISTANT Resistant     PIP/TAZO >=128 RESISTANT Resistant     * >=100,000 COLONIES/mL KLEBSIELLA PNEUMONIAE  Carbapenem Resistance Panel      Status: None   Collection Time: 05/15/22  4:51 PM  Result Value Ref Range Status   Carba Resistance IMP Gene NOT DETECTED NOT DETECTED Final   Carba Resistance VIM Gene NOT DETECTED NOT DETECTED Final   Carba Resistance NDM Gene NOT DETECTED NOT DETECTED Final   Carba Resistance KPC Gene NOT DETECTED NOT DETECTED Final   Carba Resistance OXA48 Gene NOT DETECTED NOT DETECTED Final    Comment: (NOTE) Cepheid Carba-R is an FDA-cleared nucleic acid amplification test  (NAAT)for the detection and differentiation of genes encoding the  most prevalent carbapenemases in bacterial isolate samples. Carbapenemase gene identification and implementation of comprehensive  infection control measures are recommended by the CDC to prevent the  spread of the resistant organisms. Performed at [facility], [address], [place], [place] [address]   Culture, group A strep     Status: None   Collection Time: 05/18/22  9:43 AM   Specimen: Throat  Result Value Ref Range Status   Specimen Description THROAT  Final   Special Requests NONE  Final   Culture   Final    NO GROUP A STREP (S.PYOGENES) ISOLATED Performed at [facility], [address], [place], [place] [address]  Report Status 05/20/2022 FINAL  Final      Radiology Studies: No results found.    LOS: 8 days    Jacquelin Hawking, MD [facility] 05/23/2022, 10:29 AM   If 7PM-7AM, please contact night-coverage [email or web address]

## 2022-05-24 ENCOUNTER — Other Ambulatory Visit: Payer: Self-pay

## 2022-05-24 ENCOUNTER — Encounter (HOSPITAL_COMMUNITY)
Admission: EM | Disposition: A | Discharge status: Home Health | Payer: Self-pay | Source: Home / Self Care | Attending: Internal Medicine

## 2022-05-24 ENCOUNTER — Inpatient Hospital Stay (HOSPITAL_COMMUNITY): Payer: Medicaid Other | Admitting: Anesthesiology

## 2022-05-24 ENCOUNTER — Encounter (HOSPITAL_COMMUNITY): Payer: Self-pay | Admitting: Internal Medicine

## 2022-05-24 DIAGNOSIS — N185 Chronic kidney disease, stage 5: Secondary | ICD-10-CM | POA: Diagnosis not present

## 2022-05-24 DIAGNOSIS — Z794 Long term (current) use of insulin: Secondary | ICD-10-CM

## 2022-05-24 DIAGNOSIS — D631 Anemia in chronic kidney disease: Secondary | ICD-10-CM

## 2022-05-24 DIAGNOSIS — I132 Hypertensive heart and chronic kidney disease with heart failure and with stage 5 chronic kidney disease, or end stage renal disease: Secondary | ICD-10-CM

## 2022-05-24 DIAGNOSIS — E1122 Type 2 diabetes mellitus with diabetic chronic kidney disease: Secondary | ICD-10-CM

## 2022-05-24 DIAGNOSIS — I5032 Chronic diastolic (congestive) heart failure: Secondary | ICD-10-CM | POA: Diagnosis not present

## 2022-05-24 DIAGNOSIS — I2510 Atherosclerotic heart disease of native coronary artery without angina pectoris: Secondary | ICD-10-CM

## 2022-05-24 DIAGNOSIS — N186 End stage renal disease: Secondary | ICD-10-CM

## 2022-05-24 DIAGNOSIS — I509 Heart failure, unspecified: Secondary | ICD-10-CM

## 2022-05-24 DIAGNOSIS — N390 Urinary tract infection, site not specified: Secondary | ICD-10-CM | POA: Diagnosis not present

## 2022-05-24 DIAGNOSIS — N179 Acute kidney failure, unspecified: Secondary | ICD-10-CM | POA: Diagnosis not present

## 2022-05-24 HISTORY — PX: AV FISTULA PLACEMENT: SHX1204

## 2022-05-24 LAB — CBC
HCT: 27.4 % — ABNORMAL LOW (ref 39.0–52.0)
Hemoglobin: 9.5 g/dL — ABNORMAL LOW (ref 13.0–17.0)
MCH: 30.3 pg (ref 26.0–34.0)
MCHC: 34.7 g/dL (ref 30.0–36.0)
MCV: 87.3 fL (ref 80.0–100.0)
Platelets: 269 10*3/uL (ref 150–400)
RBC: 3.14 MIL/uL — ABNORMAL LOW (ref 4.22–5.81)
RDW: 14.5 % (ref 11.5–15.5)
WBC: 5.7 10*3/uL (ref 4.0–10.5)
nRBC: 0 % (ref 0.0–0.2)

## 2022-05-24 LAB — BASIC METABOLIC PANEL
Anion gap: 12 (ref 5–15)
BUN: 84 mg/dL — ABNORMAL HIGH (ref 8–23)
CO2: 21 mmol/L — ABNORMAL LOW (ref 22–32)
Calcium: 8.2 mg/dL — ABNORMAL LOW (ref 8.9–10.3)
Chloride: 103 mmol/L (ref 98–111)
Creatinine, Ser: 6.49 mg/dL — ABNORMAL HIGH (ref 0.61–1.24)
GFR, Estimated: 9 mL/min — ABNORMAL LOW (ref >60)
Glucose, Bld: 117 mg/dL — ABNORMAL HIGH (ref 70–99)
Potassium: 4.1 mmol/L (ref 3.5–5.1)
Sodium: 136 mmol/L (ref 135–145)

## 2022-05-24 LAB — GLUCOSE, CAPILLARY
Glucose-Capillary: 98 mg/dL (ref 70–99)
Glucose-Capillary: 100 mg/dL — ABNORMAL HIGH (ref 70–99)
Glucose-Capillary: 118 mg/dL — ABNORMAL HIGH (ref 70–99)
Glucose-Capillary: 124 mg/dL — ABNORMAL HIGH (ref 70–99)
Glucose-Capillary: 116 mg/dL — ABNORMAL HIGH (ref 70–99)
Glucose-Capillary: 98 mg/dL (ref 70–99)
Glucose-Capillary: 178 mg/dL — ABNORMAL HIGH (ref 70–99)

## 2022-05-24 SURGERY — ARTERIOVENOUS (AV) FISTULA CREATION
Anesthesia: Monitor Anesthesia Care | Site: Arm Upper | Laterality: Right

## 2022-05-24 MED ORDER — HEPARIN SODIUM (PORCINE) 1000 UNIT/ML IJ SOLN
3200.0000 [IU] | Freq: Once | INTRAMUSCULAR | Status: AC
  Administered 2022-05-24: 3200 [IU]
  Filled 2022-05-24: qty 4

## 2022-05-24 MED ORDER — CEFAZOLIN SODIUM-DEXTROSE 2-3 GM-%(50ML) IV SOLR
INTRAVENOUS | Status: DC | PRN
  Administered 2022-05-24: 2 g via INTRAVENOUS

## 2022-05-24 MED ORDER — LIDOCAINE-EPINEPHRINE (PF) 1.5 %-1:200000 IJ SOLN
INTRAMUSCULAR | Status: DC | PRN
  Administered 2022-05-24: 30 mL via PERINEURAL

## 2022-05-24 MED ORDER — MIDAZOLAM HCL 5 MG/5ML IJ SOLN
INTRAMUSCULAR | Status: DC | PRN
  Administered 2022-05-24: 1 mg via INTRAVENOUS

## 2022-05-24 MED ORDER — FENTANYL CITRATE (PF) 100 MCG/2ML IJ SOLN
INTRAMUSCULAR | Status: AC
  Filled 2022-05-24: qty 2

## 2022-05-24 MED ORDER — SODIUM CHLORIDE 0.9 % IV SOLN: INTRAVENOUS | Status: DC

## 2022-05-24 MED ORDER — ORAL CARE MOUTH RINSE: 15.0000 mL | OROMUCOSAL | Status: DC | PRN

## 2022-05-24 MED ORDER — HYDROCODONE-ACETAMINOPHEN 5-325 MG PO TABS
1.0000 | ORAL_TABLET | Freq: Four times a day (QID) | ORAL | Status: DC | PRN
  Administered 2022-05-24 (×2): 1 via ORAL
  Filled 2022-05-24 (×3): qty 1

## 2022-05-24 MED ORDER — PROPOFOL 500 MG/50ML IV EMUL
INTRAVENOUS | Status: DC | PRN
  Administered 2022-05-24: 75 ug/kg/min via INTRAVENOUS

## 2022-05-24 MED ORDER — ALBUTEROL SULFATE HFA 108 (90 BASE) MCG/ACT IN AERS
INHALATION_SPRAY | RESPIRATORY_TRACT | Status: AC
  Filled 2022-05-24: qty 6.7

## 2022-05-24 MED ORDER — MIDAZOLAM HCL 2 MG/2ML IJ SOLN
INTRAMUSCULAR | Status: AC
  Filled 2022-05-24: qty 2

## 2022-05-24 MED ORDER — HEPARIN SODIUM (PORCINE) 1000 UNIT/ML IJ SOLN
INTRAMUSCULAR | Status: DC | PRN
  Administered 2022-05-24: 2000 [IU] via INTRAVENOUS

## 2022-05-24 MED ORDER — HEPARIN 6000 UNIT IRRIGATION SOLUTION
Status: AC
  Filled 2022-05-24: qty 500

## 2022-05-24 MED ORDER — FENTANYL CITRATE (PF) 100 MCG/2ML IJ SOLN: 25.0000 ug | INTRAMUSCULAR | Status: DC | PRN

## 2022-05-24 MED ORDER — HEPARIN 6000 UNIT IRRIGATION SOLUTION
Status: DC | PRN
  Administered 2022-05-24: 1

## 2022-05-24 MED ORDER — ORAL CARE MOUTH RINSE: 15.0000 mL | Freq: Once | OROMUCOSAL | Status: AC

## 2022-05-24 MED ORDER — 0.9 % SODIUM CHLORIDE (POUR BTL) OPTIME
TOPICAL | Status: DC | PRN
  Administered 2022-05-24: 1000 mL

## 2022-05-24 MED ORDER — FENTANYL CITRATE (PF) 100 MCG/2ML IJ SOLN
INTRAMUSCULAR | Status: DC | PRN
  Administered 2022-05-24: 50 ug via INTRAVENOUS

## 2022-05-24 MED ORDER — PHENYLEPHRINE HCL-NACL 20-0.9 MG/250ML-% IV SOLN
INTRAVENOUS | Status: AC
  Filled 2022-05-24: qty 250

## 2022-05-24 MED ORDER — PAPAVERINE HCL 30 MG/ML IJ SOLN
INTRAMUSCULAR | Status: AC
  Filled 2022-05-24: qty 2

## 2022-05-24 MED ORDER — PHENYLEPHRINE HCL-NACL 20-0.9 MG/250ML-% IV SOLN
INTRAVENOUS | Status: DC | PRN
  Administered 2022-05-24: 50 ug/min via INTRAVENOUS

## 2022-05-24 MED ORDER — HEPARIN SODIUM (PORCINE) 1000 UNIT/ML IJ SOLN
INTRAMUSCULAR | Status: AC
  Filled 2022-05-24: qty 10

## 2022-05-24 MED ORDER — ACETAMINOPHEN 10 MG/ML IV SOLN: 1000.0000 mg | Freq: Once | INTRAVENOUS | Status: DC | PRN

## 2022-05-24 MED ORDER — CHLORHEXIDINE GLUCONATE 0.12 % MT SOLN
OROMUCOSAL | Status: AC
  Administered 2022-05-24: 15 mL via OROMUCOSAL
  Filled 2022-05-24: qty 15

## 2022-05-24 MED ORDER — CHLORHEXIDINE GLUCONATE 0.12 % MT SOLN: 15.0000 mL | Freq: Once | OROMUCOSAL | Status: AC

## 2022-05-24 MED ORDER — LIDOCAINE HCL (PF) 1 % IJ SOLN
INTRAMUSCULAR | Status: AC
  Filled 2022-05-24: qty 30

## 2022-05-24 SURGICAL SUPPLY — 34 items
ADH SKN CLS APL DERMABOND .7 (GAUZE/BANDAGES/DRESSINGS) ×1
ARMBAND PINK RESTRICT EXTREMIT (MISCELLANEOUS) ×1 IMPLANT
BAG COUNTER SPONGE SURGICOUNT (BAG) ×1 IMPLANT
BAG SPNG CNTER NS LX DISP (BAG) ×1
BLADE CLIPPER SURG (BLADE) ×1 IMPLANT
BNDG ELASTIC 4X5.8 VLCR STR LF (GAUZE/BANDAGES/DRESSINGS) ×1 IMPLANT
CANISTER SUCT 3000ML PPV (MISCELLANEOUS) ×1 IMPLANT
CLIP TI WIDE RED SMALL 6 (CLIP) ×1 IMPLANT
COVER PROBE W GEL 5X96 (DRAPES) ×1 IMPLANT
DERMABOND ADVANCED .7 DNX12 (GAUZE/BANDAGES/DRESSINGS) ×1 IMPLANT
ELECT REM PT RETURN 9FT ADLT (ELECTROSURGICAL) ×1
ELECTRODE REM PT RTRN 9FT ADLT (ELECTROSURGICAL) ×1 IMPLANT
GLOVE BIOGEL PI IND STRL 8 (GLOVE) ×1 IMPLANT
GOWN STRL REUS W/ TWL LRG LVL3 (GOWN DISPOSABLE) ×2 IMPLANT
GOWN STRL REUS W/TWL 2XL LVL3 (GOWN DISPOSABLE) ×2 IMPLANT
GOWN STRL REUS W/TWL LRG LVL3 (GOWN DISPOSABLE) ×2
KIT BASIN OR (CUSTOM PROCEDURE TRAY) ×1 IMPLANT
KIT TURNOVER KIT B (KITS) ×1 IMPLANT
NS IRRIG 1000ML POUR BTL (IV SOLUTION) ×1 IMPLANT
PACK CV ACCESS (CUSTOM PROCEDURE TRAY) ×1 IMPLANT
PAD ARMBOARD 7.5X6 YLW CONV (MISCELLANEOUS) ×2 IMPLANT
SLING ARM FOAM STRAP LRG (SOFTGOODS) IMPLANT
SLING ARM FOAM STRAP MED (SOFTGOODS) IMPLANT
SPIKE FLUID TRANSFER (MISCELLANEOUS) ×1 IMPLANT
SUT MNCRL AB 4-0 PS2 18 (SUTURE) ×1 IMPLANT
SUT PROLENE 6 0 BV (SUTURE) ×1 IMPLANT
SUT PROLENE 7 0 BV 1 (SUTURE) IMPLANT
SUT SILK 2 0 SH (SUTURE) IMPLANT
SUT SILK 3 0 SH CR/8 (SUTURE) ×1 IMPLANT
SUT VIC AB 3-0 SH 27 (SUTURE) ×1
SUT VIC AB 3-0 SH 27X BRD (SUTURE) ×1 IMPLANT
TOWEL GREEN STERILE (TOWEL DISPOSABLE) ×1 IMPLANT
UNDERPAD 30X36 HEAVY ABSORB (UNDERPADS AND DIAPERS) ×1 IMPLANT
WATER STERILE IRR 1000ML POUR (IV SOLUTION) ×1 IMPLANT

## 2022-05-24 NOTE — Op Note (Signed)
    NAME: Ryan Jenkins    MRN: [id number] DOB: 1960-02-11    DATE OF OPERATION: 05/24/2022  PREOP DIAGNOSIS:    End stage renal disease  POSTOP DIAGNOSIS:    Same  PROCEDURE:    Right arm radiocephalic fistula   SURGEON: Victorino Sparrow  ASSIST: Mosetta Pigeon  ANESTHESIA: General   EBL: 10ml  INDICATIONS:    RAYQUAN [name] is a 62 y.o. male with end stage renal disease needing long term HD access. After discussing the risks and benefits of right arm fistula creation, Durron elected to proceed.   FINDINGS:   4mm cephalic vein High brachial bifurcation 3mm radial artery in the antecubital fossa  TECHNIQUE:   The patient was brought to the operating room and placed in supine position. The right arm was prepped and draped in standard fashion. IV antibiotics were administered. A timeout was performed.   The case began with ultrasound insonation of the brachial artery and cephalic vein. The brachial artery had a high bifurcation. The radial artery and cephalic vein demonstrated sufficient size at the antecubital fossa for arteriovenous fistula.   A transverse incision was made below the elbow creese in the antecubital fossa. The cephalic vein was isolated for 3 cm in length. Next the aponeurosis was partially released and the radial artery secured with a vessel loop. The patient was heparinized. The cephalic vein was transected and ligated distally with a 2-0 silk stick-tie. The vein was dilated with coronary dilators and flushed with heparin saline. Vascular clamps were placed proximally and distally on the brachial artery and a 5 mm arteriotomy was created on the brachial artery. This was flushed with heparin saline.  An anastomosis was created in end to side fashion on the brachial artery using running 6-0 Prolene suture.  Prior to completing the anastomosis, the vessels were flushed and the suture line was tied down. There was a light thrill in the cephalic vein  at the anastomosis, with great signal to the mid upper bicipital region. The patient had a multiphasic radial and ulnar signal. He had an excellent doppler signal in the fistula. The incision was irrigated and hemostasis achieved with cautery and suture. The deeper tissue was closed with 3-0 Vicryl and the skin closed with 4-0 Monocryl.  Dermabond was applied to the incisions. The patient was transferred to PACU in stable condition.  Given the complexity of the case,  the assistant was necessary in order to expedient the procedure and safely perform the technical aspects of the operation.  The assistant provided traction and countertraction to assist with exposure of the artery and vein.  They also assisted with suture ligation of multiple venous branches.  They played a critical role in the anastomosis. These skills, especially following the Prolene suture for the anastomosis, could not have been adequately performed by a scrub tech assistant.    Ladonna Snide, MD [facility] DATE OF DICTATION:   05/24/2022

## 2022-05-24 NOTE — Progress Notes (Addendum)
Hep B total core antibody faxed to [facility] with [facility] this morning. Referral submitted yesterday and is under review. Will assist as needed.   Olivia Canter Renal Navigator [phone]  Addendum at 10:58 am: Spoke to [place] at [facility]. Pt's case is being reviewed by a provider and awaiting determination.

## 2022-05-24 NOTE — Plan of Care (Signed)
  Problem: Education: Goal: Ability to describe self-care measures that may prevent or decrease complications (Diabetes Survival Skills Education) will improve Outcome: Progressing Goal: Individualized Educational Video(s) Outcome: Progressing   Problem: Coping: Goal: Ability to adjust to condition or change in health will improve Outcome: Progressing   Problem: Fluid Volume: Goal: Ability to maintain a balanced intake and output will improve Outcome: Progressing   Problem: Health Behavior/Discharge Planning: Goal: Ability to identify and utilize available resources and services will improve Outcome: Progressing Goal: Ability to manage health-related needs will improve Outcome: Progressing   Problem: Metabolic: Goal: Ability to maintain appropriate glucose levels will improve Outcome: Progressing   Problem: Nutritional: Goal: Maintenance of adequate nutrition will improve Outcome: Progressing Goal: Progress toward achieving an optimal weight will improve Outcome: Progressing   Problem: Skin Integrity: Goal: Risk for impaired skin integrity will decrease Outcome: Progressing   Problem: Tissue Perfusion: Goal: Adequacy of tissue perfusion will improve Outcome: Progressing   Problem: Education: Goal: Knowledge of General Education information will improve Description: Including pain rating scale, medication(s)/side effects and non-pharmacologic comfort measures Outcome: Progressing   Problem: Health Behavior/Discharge Planning: Goal: Ability to manage health-related needs will improve Outcome: Progressing   Problem: Clinical Measurements: Goal: Ability to maintain clinical measurements within normal limits will improve Outcome: Progressing Goal: Will remain free from infection Outcome: Progressing Goal: Diagnostic test results will improve Outcome: Progressing Goal: Respiratory complications will improve Outcome: Progressing Goal: Cardiovascular complication will  be avoided Outcome: Progressing   Problem: Activity: Goal: Risk for activity intolerance will decrease Outcome: Progressing   Problem: Nutrition: Goal: Adequate nutrition will be maintained Outcome: Progressing   Problem: Coping: Goal: Level of anxiety will decrease Outcome: Progressing   Problem: Elimination: Goal: Will not experience complications related to bowel motility Outcome: Progressing Goal: Will not experience complications related to urinary retention Outcome: Progressing   Problem: Pain Managment: Goal: General experience of comfort will improve Outcome: Progressing   Problem: Safety: Goal: Ability to remain free from injury will improve Outcome: Progressing   Problem: Skin Integrity: Goal: Risk for impaired skin integrity will decrease Outcome: Progressing   Problem: Urinary Elimination: Goal: Signs and symptoms of infection will decrease Outcome: Progressing   Problem: Education: Goal: Knowledge of disease and its progression will improve Outcome: Progressing Goal: Individualized Educational Video(s) Outcome: Progressing   Problem: Fluid Volume: Goal: Compliance with measures to maintain balanced fluid volume will improve Outcome: Progressing   Problem: Health Behavior/Discharge Planning: Goal: Ability to manage health-related needs will improve Outcome: Progressing   Problem: Nutritional: Goal: Ability to make healthy dietary choices will improve Outcome: Progressing   Problem: Clinical Measurements: Goal: Complications related to the disease process, condition or treatment will be avoided or minimized Outcome: Progressing

## 2022-05-24 NOTE — Progress Notes (Signed)
Nephrology Follow-Up Consult note   Assessment/Recommendations: Ryan Jenkins is a/an 62 y.o. male with a past medical history significant for CKD 5, DM2, HTN, recurrent MDR UTI, admitted for AKI/urinary retention, lower extremity edema.       AKI on CKD 5: Baseline creatinine around 3.9-4.6.  Follows with Dr. Ronalee Belts.  Presented with lower extremity edema and underwent diuresis.  Likely over diuresis plus some AKI related to infection and obstruction.  Also with some advancement of disease.  Discussed with primary nephrologist.  Patient has not been very compliant and he is worried he likely now has ESRD.  -Status post TDC with IR.  Appreciate help -Undergoing access creation with vascular surgery today, appreciate help -Continue with dialysis tomorrow -Follow-up clip process -Holding Lasix and ACE inhibitor -Continue to monitor daily Cr, Dose meds for GFR -Monitor Daily I/Os, Daily weight  -Maintain MAP>65 for optimal renal perfusion.  -Avoid nephrotoxic medications including NSAIDs -Use synthetic opioids (Fentanyl/Dilaudid) if needed  ESBL Klebsiella UTI: Antibiotics per primary team.  Hypertension: Holding ACE inhibitor.  Continue amlodipine and beta-blocker.  Continue to monitor  Anemia due to CKD/chronic inflammation; obtain iron studies and consider esa.  Hyponatremia: Resolved with his  Metabolic acidosis: Associated with AKI.  Minimal.  Likely will improve with dialysis  Hyperphosphatemia: Continue sevelamer.  Dialysis as above   Recommendations conveyed to primary service.    Darnell Level [facility] 05/24/2022 9:40 AM  ___________________________________________________________  CC: AKI  Interval History/Subjective: Patient feels well today.  Tolerated dialysis with no issues.  Planning for dialysis today as well as access creation with vascular surgery   Medications:  Current Facility-Administered Medications  Medication Dose Route  Frequency Provider Last Rate Last Admin   0.9 %  sodium chloride infusion   Intravenous Continuous Stoltzfus, Gregory P, DO 10 mL/hr at 05/24/22 0907 New Bag at 05/24/22 0907   [MAR Hold] acetaminophen (TYLENOL) tablet 650 mg  650 mg Oral Q6H PRN Carollee Herter, DO   650 mg at 05/22/22 0853   Or   [MAR Hold] acetaminophen (TYLENOL) suppository 650 mg  650 mg Rectal Q6H PRN Carollee Herter, DO       [MAR Hold] amLODipine (NORVASC) tablet 10 mg  10 mg Oral Daily Carollee Herter, DO   10 mg at 05/23/22 0857   [MAR Hold] ceFAZolin (ANCEF) IVPB 1 g/50 mL premix  1 g Intravenous On Call Rhyne, Ames Coupe, PA-C       [MAR Hold] Chlorhexidine Gluconate Cloth 2 % PADS 6 each  6 each Topical Q0600 Darnell Level, MD   6 each at 05/24/22 0559   [MAR Hold] clopidogrel (PLAVIX) tablet 75 mg  75 mg Oral Daily Carollee Herter, DO   75 mg at 05/23/22 0857   [MAR Hold] famotidine (PEPCID) tablet 10 mg  10 mg Oral Daily Carollee Herter, DO   10 mg at 05/23/22 0858   fentaNYL (SUBLIMAZE) 100 MCG/2ML injection            [MAR Hold] finasteride (PROSCAR) tablet 5 mg  5 mg Oral Daily Carollee Herter, DO   5 mg at 05/23/22 0858   [MAR Hold] gabapentin (NEURONTIN) capsule 300 mg  300 mg Oral Daily Leandro Reasoner Tublu, MD   300 mg at 05/23/22 0857   [MAR Hold] guaiFENesin (ROBITUSSIN) 100 MG/5ML liquid 5 mL  5 mL Oral Q4H PRN Amin, Loura Halt, MD       [MAR Hold] heparin injection 5,000 Units  5,000 Units Subcutaneous Q8H  Carollee Herter, DO   5,000 Units at 05/23/22 2210   [MAR Hold] hydrALAZINE (APRESOLINE) injection 10 mg  10 mg Intravenous Q4H PRN Dimple Nanas, MD       [MAR Hold] hydrALAZINE (APRESOLINE) tablet 100 mg  100 mg Oral TID Carollee Herter, DO   100 mg at 05/23/22 2209   [facility] Hold] insulin aspart (novoLOG) injection 0-5 Units  0-5 Units Subcutaneous QHS Carollee Herter, DO   2 Units at 05/17/22 2101   [facility] Hold] insulin aspart (novoLOG) injection 0-9 Units  0-9 Units Subcutaneous TID WC Carollee Herter, DO   1 Units at 05/23/22 0900    [MAR Hold] insulin glargine-yfgn (SEMGLEE) injection 5 Units  5 Units Subcutaneous QHS Carollee Herter, DO   5 Units at 05/23/22 2209   [MAR Hold] ipratropium-albuterol (DUONEB) 0.5-2.5 (3) MG/3ML nebulizer solution 3 mL  3 mL Nebulization Q4H PRN Amin, Loura Halt, MD       [MAR Hold] melatonin tablet 10 mg  10 mg Oral QHS PRN Carollee Herter, DO   10 mg at 05/17/22 2055   [MAR Hold] menthol-cetylpyridinium (CEPACOL) lozenge 3 mg  1 lozenge Oral PRN Berton Mount I, MD   3 mg at 05/18/22 1155   [MAR Hold] metoCLOPramide (REGLAN) tablet 5 mg  5 mg Oral TID AC & HS Carollee Herter, DO   5 mg at 05/23/22 2209   [facility] Hold] metoprolol succinate (TOPROL-XL) 24 hr tablet 12.5 mg  12.5 mg Oral Daily Carollee Herter, DO   12.5 mg at 05/24/22 0926   [MAR Hold] metoprolol tartrate (LOPRESSOR) injection 5 mg  5 mg Intravenous Q4H PRN Amin, Ankit Chirag, MD       midazolam (VERSED) 2 MG/2ML injection            [MAR Hold] mupirocin ointment (BACTROBAN) 2 % 1 Application  1 Application Nasal BID Narda Bonds, MD   1 Application at 05/23/22 2212   [MAR Hold] ondansetron (ZOFRAN) tablet 4 mg  4 mg Oral Q6H PRN Carollee Herter, DO   4 mg at 05/21/22 1739   Or   [MAR Hold] ondansetron (ZOFRAN) injection 4 mg  4 mg Intravenous Q6H PRN Carollee Herter, DO       [facility] Hold] Oral care mouth rinse  15 mL Mouth Rinse PRN Narda Bonds, MD       pneumococcal 23 valent vaccine (PNEUMOVAX-23) injection 0.5 mL  0.5 mL Intramuscular Prior to discharge Berton Mount I, MD       [facility] Hold] polyethylene glycol (MIRALAX / GLYCOLAX) packet 17 g  17 g Oral Daily Carollee Herter, DO   17 g at 05/22/22 0812   [MAR Hold] rosuvastatin (CRESTOR) tablet 10 mg  10 mg Oral Daily Carollee Herter, DO   10 mg at 05/23/22 0857   [MAR Hold] senna-docusate (Senokot-S) tablet 1 tablet  1 tablet Oral QHS PRN Dimple Nanas, MD   1 tablet at 05/19/22 0518   [MAR Hold] sevelamer carbonate (RENVELA) tablet 800 mg  800 mg Oral TID WC Darnell Level, MD   800 mg at  05/22/22 1709   [MAR Hold] sodium bicarbonate tablet 1,300 mg  1,300 mg Oral BID Carollee Herter, DO   1,300 mg at 05/23/22 2209   [MAR Hold] tamsulosin (FLOMAX) capsule 0.4 mg  0.4 mg Oral QHS Carollee Herter, DO   0.4 mg at 05/23/22 2208   [MAR Hold] traZODone (DESYREL) tablet 50 mg  50 mg Oral QHS PRN Dimple Nanas, MD  50 mg at 05/23/22 2209      Review of Systems: 10 systems reviewed and negative except per interval history/subjective  Physical Exam: Vitals:   05/24/22 0530 05/24/22 0858  BP: (!) 143/77 (!) 175/86  Pulse: 79 88  Resp: 17 18  Temp: 98.6 F (37 C) 98.2 F (36.8 C)  SpO2: 100% 98%   No intake/output data recorded.  Intake/Output Summary (Last 24 hours) at 05/24/2022 0940 Last data filed at 05/24/2022 0600 Gross per 24 hour  Intake 360 ml  Output 1500 ml  Net -1140 ml   Constitutional: well-appearing, no acute distress ENMT: ears and nose without scars or lesions, MMM CV: normal rate, no edema Respiratory: Bilateral chest rise, normal work of breathing Gastrointestinal: soft, non-tender, no palpable masses or hernias Skin: no visible lesions or rashes Psych: alert, judgement/insight appropriate, appropriate mood and affect   Test Results I personally reviewed new and old clinical labs and radiology tests Lab Results  Component Value Date   NA 136 05/24/2022   K 4.1 05/24/2022   CL 103 05/24/2022   CO2 21 (L) 05/24/2022   BUN 84 (H) 05/24/2022   CREATININE 6.49 (H) 05/24/2022   CALCIUM 8.2 (L) 05/24/2022   ALBUMIN 2.4 (L) 05/23/2022   PHOS 8.3 (H) 05/23/2022    CBC Recent Labs  Lab 05/19/22 0219 05/24/22 0445  WBC 5.4 5.7  NEUTROABS 3.1  --   HGB 9.5* 9.5*  HCT 28.0* 27.4*  MCV 88.9 87.3  PLT 241 269

## 2022-05-24 NOTE — Anesthesia Procedure Notes (Signed)
Anesthesia Regional Block: Supraclavicular block   Pre-Anesthetic Checklist: , timeout performed,  Correct Patient, Correct Site, Correct Laterality,  Correct Procedure, Correct Position, site marked,  Risks and benefits discussed,  Surgical consent,  Pre-op evaluation,  At surgeon's request and post-op pain management  Laterality: Right  Prep: Dura Prep       Needles:  Injection technique: Single-shot  Needle Type: Echogenic Stimulator Needle     Needle Length: 5cm  Needle Gauge: 20     Additional Needles:   Procedures:,,,, ultrasound used (permanent image in chart),,    Narrative:  Start time: 05/24/2022 9:24 AM End time: 05/24/2022 9:28 AM Injection made incrementally with aspirations every 5 mL.  Performed by: Personally  Anesthesiologist: Atilano Median, DO  Additional Notes: Patient identified. Risks/Benefits/Options discussed with patient including but not limited to bleeding, infection, nerve damage, failed block, incomplete pain control. Patient expressed understanding and wished to proceed. All questions were answered. Sterile technique was used throughout the entire procedure. Please see nursing notes for vital signs. Aspirated in 5cc intervals with injection for negative confirmation. Patient was given instructions on fall risk and not to get out of bed. All questions and concerns addressed with instructions to call with any issues or inadequate analgesia.

## 2022-05-24 NOTE — Progress Notes (Signed)
  Daily Progress Note   Subjective: No complaints  Objective: Vitals:   05/24/22 0530 05/24/22 0858  BP: (!) 143/77 (!) 175/86  Pulse: 79 88  Resp: 17 18  Temp: 98.6 F (37 C) 98.2 F (36.8 C)  SpO2: 100% 98%    Physical Examination Nonlaboered breathing Regular rate Pleasant Alert and oriented 2+ right radial  ASSESSMENT/PLAN:  Pt with ESRD in need of long term HD access.  Plan for right -sided fistula creation.  After discussing the risks and benefits, pt and sister elected to proceed.    Fara Olden MD [facility] [phone] 05/24/2022  9:30 AM

## 2022-05-24 NOTE — Anesthesia Preprocedure Evaluation (Addendum)
Anesthesia Evaluation  Patient identified by MRN, date of birth, ID band Patient awake    Reviewed: Allergy & Precautions, NPO status , Patient's Chart, lab work & pertinent test results  Airway Mallampati: III  TM Distance: >3 FB Neck ROM: Full    Dental no notable dental hx.    Pulmonary neg pulmonary ROS   Pulmonary exam normal        Cardiovascular hypertension, Pt. on medications and Pt. on home beta blockers + CAD and +CHF  + dysrhythmias  Rhythm:Regular Rate:Normal     Neuro/Psych   Anxiety Depression    negative neurological ROS     GI/Hepatic Neg liver ROS, PUD,GERD  ,,  Endo/Other  diabetes, Type 2, Insulin Dependent    Renal/GU CRFRenal disease  negative genitourinary   Musculoskeletal  (+) Arthritis , Osteoarthritis,    Abdominal Normal abdominal exam  (+)   Peds  Hematology  (+) Blood dyscrasia, anemia Lab Results      Component                Value               Date                      WBC                      5.7                 05/24/2022                HGB                      9.5 (L)             05/24/2022                HCT                      27.4 (L)            05/24/2022                MCV                      87.3                05/24/2022                PLT                      269                 05/24/2022             Lab Results      Component                Value               Date                      NA                       136                 05/24/2022  K                        4.1                 05/24/2022                CO2                      21 (L)              05/24/2022                GLUCOSE                  117 (H)             05/24/2022                BUN                      84 (H)              05/24/2022                CREATININE               6.49 (H)            05/24/2022                CALCIUM                  8.2 (L)             05/24/2022                 EGFR                     32 (L)              07/13/2020                GFRNONAA                 9 (L)               05/24/2022              Anesthesia Other Findings   Reproductive/Obstetrics                             Anesthesia Physical Anesthesia Plan  ASA: 3  Anesthesia Plan: MAC and Regional   Post-op Pain Management: Regional block*   Induction: Intravenous  PONV Risk Score and Plan: 1 and Ondansetron, Propofol infusion, Midazolam and Treatment may vary due to age or medical condition  Airway Management Planned: Simple Face Mask and Nasal Cannula  Additional Equipment: None  Intra-op Plan:   Post-operative Plan:   Informed Consent: I have reviewed the patients History and Physical, chart, labs and discussed the procedure including the risks, benefits and alternatives for the proposed anesthesia with the patient or authorized representative who has indicated his/her understanding and acceptance.     Dental advisory given  Plan Discussed with: CRNA  Anesthesia Plan Comments:        Anesthesia Quick Evaluation

## 2022-05-24 NOTE — Progress Notes (Signed)
Pt educated on new right AVF. Pt given a stethoscope to check for bruit and thrill. Pt understandseducation and successfully demonstrated how to check assess for thrill and bruit.

## 2022-05-24 NOTE — Progress Notes (Signed)
Received patient in bed to unit.  Alert and oriented.  Informed consent signed and in chart.   TX duration:2.5  Patient tolerated well.  Transported back to the room  Alert, without acute distress.  Hand-off given to patient's nurse.   Access used: right [facility] Access issues: NONE  Total UF removed: 0  Medication(s) given: NORCO/VICODIN    05/24/22 1648  Vitals  Temp 98.1 F (36.7 C)  Temp Source Oral  BP (!) 158/80  MAP (mmHg) 104  BP Location Left Arm  BP Method Automatic  Patient Position (if appropriate) Lying  Pulse Rate 74  Pulse Rate Source Monitor  ECG Heart Rate 76  Resp 12  Oxygen Therapy  SpO2 98 %  O2 Device Room Air  During Treatment Monitoring  HD Safety Checks Performed Yes  Intra-Hemodialysis Comments Tx completed;Tolerated well  Dialysis Fluid Bolus Normal Saline  Bolus Amount (mL) 300 mL  Hemodialysis Catheter Right Subclavian Double lumen Permanent (Tunneled)  Placement Date/Time: 05/23/22 1230   Placed prior to admission: Yes  Serial / Lot #: [id number]  Expiration Date: 11/15/26  Time Out: Correct patient;Correct site;Correct procedure  Maximum sterile barrier precautions: Hand hygiene;Cap;Mask;Sterile gow...  Site Condition No complications  Blue Lumen Status Flushed;Heparin locked  Red Lumen Status Flushed;Heparin locked  Purple Lumen Status N/A  Catheter fill solution Heparin 1000 units/ml  Catheter fill volume (Arterial) 1.6 cc  Catheter fill volume (Venous) 1.6  Dressing Type Transparent  Dressing Status Antimicrobial disc in place;Clean, Dry, Intact  Drainage Description None  Dressing Change Due 05/30/22  Post treatment catheter status Capped and Clamped      Maliq Pilley S [facility]

## 2022-05-24 NOTE — Progress Notes (Signed)
OR called and instructed that pt is going to be going to OR.  Report called to Marissa, RN in short stay [id number].

## 2022-05-24 NOTE — Progress Notes (Signed)
PROGRESS NOTE    Ryan Jenkins  ZOX:[id number] DOB: [birth year] DOA: 05/15/2022 PCP: Malka So., MD   Brief Narrative: Ryan Jenkins is a 62 y.o. male with a history of CKD stage V, diabetes mellitus type 2, hypertension, ESBL infection, GERD, hyperlipidemia, anemia of chronic kidney disease. Patient presented secondary to bilateral leg edema with associated AKI concerning for progressive CKD. Patient started on IV Lasix diuresis. Nephrology consulted. Patient also found to have ESBL Klebsiella pneumonia UTI, treated with meropenem. Nephrology recommendations for patient to transition to intermittent hemodialysis as an inpatient.  Tunneled dialysis catheter was placed on 5/8 with subsequent initiation of hemodialysis.  Vascular surgery.  Right forearm fistula on 5/9.   Assessment and Plan: * Urinary tract infection due to ESBL Klebsiella-resolved as of 05/23/2022 Urine culture significant for ESBL Klebsiella pneumoniae sensitive to imipenem. Patient completed a 5 day course of meropenem.  CKD (chronic kidney disease) stage 5, GFR less than 15 ml/min (HCC) Baseline creatinine of 3.6-4.6 per nephrology notes. Patient follows with nephrology and had plans prior to admission for AVF vs AVG placement per vascular surgery.  Chronic diastolic heart failure (HCC) Stable. Fluid management will transition to per hemodialysis.  Anemia in chronic kidney disease (CKD) Chronic. Stable.  Gastroesophageal reflux disease without esophagitis -Continue famotidine  HLD (hyperlipidemia) -Continue Crestor 10 mg daily  Essential hypertension Mostly controlled. Patient is managed on lisinopril, amlodipine and metoprolol as an outpatient. Lisinopril discontinued this admission. -Continue amlodipine and metoprolol succinate  DM2 (diabetes mellitus, type 2) (HCC) Uncontrolled with hyperglycemia. Last hemoglobin A1C is improved to 7.4% from 8.4%. Patient is managed on glipizide and Lantus as an  outpatient. -Continue Semglee in SSI while inpatient  AKI (acute kidney injury) [facility]) Nephrology on board. Baseline creatinine of 3.6-4.6  per nephrology. Creatinine of 5.40 on admission which has continued to worsen in setting of severe renal impairment.  TDC placed on 5/8.  Hemodialysis started on 5/8.  Right forearm fistula created on 5/9. -Nephrology recommendations: Continued hemodialysis     DVT prophylaxis: Heparin subq Code Status:   Code Status: Full Code Family Communication: None at bedside Disposition Plan: Discharge home likely in 2-4 days pending outpatient HD placement and continued nephrology recommendations for inpatient management   Consultants:  Nephrology Vascular surgery  Procedures:  None  Antimicrobials: Meropenem    Subjective: Patient reports no specific issues overnight.  Patient does report some right shoulder and arm pain.  Pain is not improved with Tylenol.  Objective: BP (!) 147/76 (BP Location: Left Arm)   Pulse 88   Temp 97.8 F (36.6 C) (Oral)   Resp 14   Ht 6\' 2"  (1.88 m)   Wt 109 kg   SpO2 99%   BMI 30.85 kg/m   Examination:  General exam: Appears calm and comfortable  Respiratory system: Respiratory effort normal. Gastrointestinal system: Abdomen is nondistended, soft and nontender. Central nervous system: Alert and oriented.  Musculoskeletal: No calf tenderness Skin: No cyanosis. No rashes Psychiatry: Judgement and insight appear normal. Mood & affect appropriate.    Data Reviewed: I have personally reviewed following labs and imaging studies  CBC Lab Results  Component Value Date   WBC 5.7 05/24/2022   RBC 3.14 (L) 05/24/2022   HGB 9.5 (L) 05/24/2022   HCT 27.4 (L) 05/24/2022   MCV 87.3 05/24/2022   MCH 30.3 05/24/2022   PLT 269 05/24/2022   MCHC 34.7 05/24/2022   RDW 14.5 05/24/2022   LYMPHSABS 1.2 05/19/2022  MONOABS 0.8 05/19/2022   EOSABS 0.2 05/19/2022   BASOSABS 0.0 05/19/2022     Last metabolic  panel Lab Results  Component Value Date   NA 136 05/24/2022   K 4.1 05/24/2022   CL 103 05/24/2022   CO2 21 (L) 05/24/2022   BUN 84 (H) 05/24/2022   CREATININE 6.49 (H) 05/24/2022   GLUCOSE 117 (H) 05/24/2022   GFRNONAA 9 (L) 05/24/2022   GFRAA 45 (L) 08/07/2019   CALCIUM 8.2 (L) 05/24/2022   PHOS 8.3 (H) 05/23/2022   PROT 6.2 (L) 05/16/2022   ALBUMIN 2.4 (L) 05/23/2022   LABGLOB 3.5 12/25/2018   AGRATIO 1.2 12/25/2018   BILITOT 0.5 05/16/2022   ALKPHOS 60 05/16/2022   AST 13 (L) 05/16/2022   ALT 11 05/16/2022   ANIONGAP 12 05/24/2022    GFR: Estimated Creatinine Clearance: 15.7 mL/min (A) (by C-G formula based on SCr of 6.49 mg/dL (H)).  Recent Results (from the past 240 hour(s))  Urine Culture     Status: Abnormal   Collection Time: 05/15/22  4:51 PM   Specimen: Urine, Clean Catch  Result Value Ref Range Status   Specimen Description URINE, CLEAN CATCH  Final   Special Requests NONE Reflexed from [phone]  Final   Culture (A)  Final    >=100,000 COLONIES/mL KLEBSIELLA PNEUMONIAE MULTI-DRUG RESISTANT ORGANISM CRITICAL RESULT CALLED TO, READ BACK BY AND VERIFIED WITH: PHARMD ADRIENNE Vincente Poli [id number] AT 1950 BY EC Performed at [facility], [address], [place], [place] [address]    Report Status 05/18/2022 FINAL  Final   Organism ID, Bacteria KLEBSIELLA PNEUMONIAE (A)  Final      Susceptibility   Klebsiella pneumoniae - MIC*    AMPICILLIN >=32 RESISTANT Resistant     CEFAZOLIN >=64 RESISTANT Resistant     CEFEPIME >=32 RESISTANT Resistant     CEFTRIAXONE >=64 RESISTANT Resistant     CIPROFLOXACIN >=4 RESISTANT Resistant     GENTAMICIN <=1 SENSITIVE Sensitive     IMIPENEM <=0.25 SENSITIVE Sensitive     NITROFURANTOIN >=512 RESISTANT Resistant     TRIMETH/SULFA >=320 RESISTANT Resistant     AMPICILLIN/SULBACTAM >=32 RESISTANT Resistant     PIP/TAZO >=128 RESISTANT Resistant     * >=100,000 COLONIES/mL KLEBSIELLA PNEUMONIAE  Carbapenem Resistance Panel      Status: None   Collection Time: 05/15/22  4:51 PM  Result Value Ref Range Status   Carba Resistance IMP Gene NOT DETECTED NOT DETECTED Final   Carba Resistance VIM Gene NOT DETECTED NOT DETECTED Final   Carba Resistance NDM Gene NOT DETECTED NOT DETECTED Final   Carba Resistance KPC Gene NOT DETECTED NOT DETECTED Final   Carba Resistance OXA48 Gene NOT DETECTED NOT DETECTED Final    Comment: (NOTE) Cepheid Carba-R is an FDA-cleared nucleic acid amplification test  (NAAT)for the detection and differentiation of genes encoding the  most prevalent carbapenemases in bacterial isolate samples. Carbapenemase gene identification and implementation of comprehensive  infection control measures are recommended by the CDC to prevent the  spread of the resistant organisms. Performed at [facility], [address], [place], [place] [address]   Culture, group A strep     Status: None   Collection Time: 05/18/22  9:43 AM   Specimen: Throat  Result Value Ref Range Status   Specimen Description THROAT  Final   Special Requests NONE  Final   Culture   Final    NO GROUP A STREP (S.PYOGENES) ISOLATED Performed at [facility], [address] [place], [place] [address]    Report Status 05/20/2022 FINAL  Final  Surgical PCR screen     Status: None   Collection Time: 05/23/22  8:40 PM   Specimen: Nasal Mucosa; Nasal Swab  Result Value Ref Range Status   MRSA, PCR NEGATIVE NEGATIVE Final   Staphylococcus aureus NEGATIVE NEGATIVE Final    Comment: (NOTE) The Xpert SA Assay (FDA approved for NASAL specimens in patients 61 years of age and older), is one component of a comprehensive surveillance program. It is not intended to diagnose infection nor to guide or monitor treatment. Performed at [facility], [address] [place], [place] [address]       Radiology Studies: IR Fluoro Guide CV Line Right  Result Date: 05/23/2022 CLINICAL DATA:  Progressive renal failure now  requiring hemodialysis. Request to place a tunneled hemodialysis catheter. EXAM: TUNNELED CENTRAL VENOUS HEMODIALYSIS CATHETER PLACEMENT WITH ULTRASOUND AND FLUOROSCOPIC GUIDANCE ANESTHESIA/SEDATION: Moderate (conscious) sedation was employed during this procedure. A total of Versed 1.0 mg and Fentanyl 75 mcg was administered intravenously. Moderate Sedation Time: 22 minutes. The patient's level of consciousness and vital signs were monitored continuously by radiology nursing throughout the procedure under my direct supervision. MEDICATIONS: 2 g IV Ancef. FLUOROSCOPY: 24 seconds.  4.0 mGy. PROCEDURE: The procedure, risks, benefits, and alternatives were explained to the patient. Questions regarding the procedure were encouraged and answered. The patient understands and consents to the procedure. A timeout was performed prior to initiating the procedure. The right neck and chest were prepped with chlorhexidine in a sterile fashion, and a sterile drape was applied covering the operative field. Maximum barrier sterile technique with sterile gowns and gloves were used for the procedure. Local anesthesia was provided with 1% lidocaine. Ultrasound was used to confirm patency of the right internal jugular vein. A permanent ultrasound image was saved and recorded. After creating a small venotomy incision, a 21 gauge needle was advanced into the right internal jugular vein under direct, real-time ultrasound guidance. Ultrasound image documentation was performed. After securing guidewire access, an 8 Fr dilator was placed. A J-wire was kinked to measure appropriate catheter length. A Palindrome tunneled hemodialysis catheter measuring 19 cm from tip to cuff was chosen for placement. This was tunneled in a retrograde fashion from the chest wall to the venotomy incision. At the venotomy, serial dilatation was performed and a 15 Fr peel-away sheath was placed over a guidewire. The catheter was then placed through the sheath and  the sheath removed. Final catheter positioning was confirmed and documented with a fluoroscopic spot image. The catheter was aspirated, flushed with saline, and injected with appropriate volume heparin dwells. The venotomy incision was closed with subcuticular 4-0 Vicryl. Dermabond was applied to the incision. The catheter exit site was secured with 0-Prolene retention sutures. COMPLICATIONS: None.  No pneumothorax. FINDINGS: After catheter placement, the tip lies in the right atrium. The catheter aspirates normally and is ready for immediate use. IMPRESSION: Placement of tunneled hemodialysis catheter via the right internal jugular vein. The catheter tip lies in the right atrium. The catheter is ready for immediate use. Electronically Signed   By: Irish Lack M.D.   On: 05/23/2022 14:50   IR US Guide Vasc Access Right  Result Date: 05/23/2022 CLINICAL DATA:  Progressive renal failure now requiring hemodialysis. Request to place a tunneled hemodialysis catheter. EXAM: TUNNELED CENTRAL VENOUS HEMODIALYSIS CATHETER PLACEMENT WITH ULTRASOUND AND FLUOROSCOPIC GUIDANCE ANESTHESIA/SEDATION: Moderate (conscious) sedation was employed  during this procedure. A total of Versed 1.0 mg and Fentanyl 75 mcg was administered intravenously. Moderate Sedation Time: 22 minutes. The patient's level of consciousness and vital signs were monitored continuously by radiology nursing throughout the procedure under my direct supervision. MEDICATIONS: 2 g IV Ancef. FLUOROSCOPY: 24 seconds.  4.0 mGy. PROCEDURE: The procedure, risks, benefits, and alternatives were explained to the patient. Questions regarding the procedure were encouraged and answered. The patient understands and consents to the procedure. A timeout was performed prior to initiating the procedure. The right neck and chest were prepped with chlorhexidine in a sterile fashion, and a sterile drape was applied covering the operative field. Maximum barrier sterile technique  with sterile gowns and gloves were used for the procedure. Local anesthesia was provided with 1% lidocaine. Ultrasound was used to confirm patency of the right internal jugular vein. A permanent ultrasound image was saved and recorded. After creating a small venotomy incision, a 21 gauge needle was advanced into the right internal jugular vein under direct, real-time ultrasound guidance. Ultrasound image documentation was performed. After securing guidewire access, an 8 Fr dilator was placed. A J-wire was kinked to measure appropriate catheter length. A Palindrome tunneled hemodialysis catheter measuring 19 cm from tip to cuff was chosen for placement. This was tunneled in a retrograde fashion from the chest wall to the venotomy incision. At the venotomy, serial dilatation was performed and a 15 Fr peel-away sheath was placed over a guidewire. The catheter was then placed through the sheath and the sheath removed. Final catheter positioning was confirmed and documented with a fluoroscopic spot image. The catheter was aspirated, flushed with saline, and injected with appropriate volume heparin dwells. The venotomy incision was closed with subcuticular 4-0 Vicryl. Dermabond was applied to the incision. The catheter exit site was secured with 0-Prolene retention sutures. COMPLICATIONS: None.  No pneumothorax. FINDINGS: After catheter placement, the tip lies in the right atrium. The catheter aspirates normally and is ready for immediate use. IMPRESSION: Placement of tunneled hemodialysis catheter via the right internal jugular vein. The catheter tip lies in the right atrium. The catheter is ready for immediate use. Electronically Signed   By: Irish Lack M.D.   On: 05/23/2022 14:50      LOS: 9 days    Jacquelin Hawking, MD [facility] 05/24/2022, 1:28 PM   If 7PM-7AM, please contact night-coverage [email or web address]

## 2022-05-24 NOTE — Transfer of Care (Addendum)
Immediate Anesthesia Transfer of Care Note  Patient: Ryan Jenkins  Procedure(s) Performed: RADIOCEPHALIC ARTERIOVENOUS (AV) FISTULA CREATION (Right: Arm Upper)  Patient Location: PACU  Anesthesia Type:MAC and General  Level of Consciousness: awake, alert , and patient cooperative  Airway & Oxygen Therapy: Patient Spontanous Breathing  Post-op Assessment: Report given to RN and Post -op Vital signs reviewed and stable  Post vital signs: Reviewed and stable  Last Vitals:  Vitals Value Taken Time  BP 122/65 05/24/22 1104  Temp    Pulse 94 05/24/22 1108  Resp 14 05/24/22 1108  SpO2 95 % 05/24/22 1108  Vitals shown include unvalidated device data.  Last Pain:  Vitals:   05/24/22 0902  TempSrc:   PainSc: 0-No pain      Patients Stated Pain Goal: 0 (05/22/22 1610)  Complications: No notable events documented.

## 2022-05-24 NOTE — Anesthesia Postprocedure Evaluation (Signed)
Anesthesia Post Note  Patient: Ryan Jenkins  Procedure(s) Performed: RADIOCEPHALIC ARTERIOVENOUS (AV) FISTULA CREATION (Right: Arm Upper)     Patient location during evaluation: PACU Anesthesia Type: MAC and Regional Level of consciousness: awake and alert Pain management: pain level controlled Vital Signs Assessment: post-procedure vital signs reviewed and stable Respiratory status: spontaneous breathing, nonlabored ventilation, respiratory function stable and patient connected to nasal cannula oxygen Cardiovascular status: stable and blood pressure returned to baseline Postop Assessment: no apparent nausea or vomiting Anesthetic complications: no   No notable events documented.  Last Vitals:  Vitals:   05/24/22 1648 05/24/22 1655  BP: (!) 158/80 (!) 167/82  Pulse: 74 73  Resp: 12 13  Temp: 36.7 C   SpO2: 98% 99%    Last Pain:  Vitals:   05/24/22 1648  TempSrc: Oral  PainSc:                  Nelle Don Hildreth Robart

## 2022-05-25 ENCOUNTER — Encounter (HOSPITAL_COMMUNITY): Payer: Self-pay | Admitting: Vascular Surgery

## 2022-05-25 DIAGNOSIS — B9689 Other specified bacterial agents as the cause of diseases classified elsewhere: Secondary | ICD-10-CM | POA: Diagnosis not present

## 2022-05-25 DIAGNOSIS — N390 Urinary tract infection, site not specified: Secondary | ICD-10-CM | POA: Diagnosis not present

## 2022-05-25 LAB — CBC
HCT: 30.1 % — ABNORMAL LOW (ref 39.0–52.0)
Hemoglobin: 9.6 g/dL — ABNORMAL LOW (ref 13.0–17.0)
MCH: 29.5 pg (ref 26.0–34.0)
MCHC: 31.9 g/dL (ref 30.0–36.0)
MCV: 92.6 fL (ref 80.0–100.0)
Platelets: 273 10*3/uL (ref 150–400)
RBC: 3.25 MIL/uL — ABNORMAL LOW (ref 4.22–5.81)
RDW: 14.3 % (ref 11.5–15.5)
WBC: 6.6 10*3/uL (ref 4.0–10.5)
nRBC: 0 % (ref 0.0–0.2)

## 2022-05-25 LAB — RENAL FUNCTION PANEL
Albumin: 2.7 g/dL — ABNORMAL LOW (ref 3.5–5.0)
Anion gap: 10 (ref 5–15)
BUN: 51 mg/dL — ABNORMAL HIGH (ref 8–23)
CO2: 26 mmol/L (ref 22–32)
Calcium: 8.2 mg/dL — ABNORMAL LOW (ref 8.9–10.3)
Chloride: 100 mmol/L (ref 98–111)
Creatinine, Ser: 5.05 mg/dL — ABNORMAL HIGH (ref 0.61–1.24)
GFR, Estimated: 12 mL/min — ABNORMAL LOW (ref >60)
Glucose, Bld: 118 mg/dL — ABNORMAL HIGH (ref 70–99)
Phosphorus: 4.5 mg/dL (ref 2.5–4.6)
Potassium: 4.2 mmol/L (ref 3.5–5.1)
Sodium: 136 mmol/L (ref 135–145)

## 2022-05-25 LAB — IRON AND TIBC
Iron: 71 ug/dL (ref 45–182)
Saturation Ratios: 31 % (ref 17.9–39.5)
TIBC: 228 ug/dL — ABNORMAL LOW (ref 250–450)
UIBC: 157 ug/dL

## 2022-05-25 LAB — GLUCOSE, CAPILLARY
Glucose-Capillary: 131 mg/dL — ABNORMAL HIGH (ref 70–99)
Glucose-Capillary: 91 mg/dL (ref 70–99)

## 2022-05-25 LAB — FERRITIN: Ferritin: 136 ng/mL (ref 24–336)

## 2022-05-25 MED ORDER — HEPARIN SODIUM (PORCINE) 1000 UNIT/ML IJ SOLN
3200.0000 [IU] | Freq: Once | INTRAMUSCULAR | Status: AC
  Administered 2022-05-25: 3200 [IU]
  Filled 2022-05-25: qty 4

## 2022-05-25 MED ORDER — FAMOTIDINE 10 MG PO TABS: 10.0000 mg | ORAL_TABLET | ORAL | 0 refills | Status: DC

## 2022-05-25 MED ORDER — GABAPENTIN 300 MG PO CAPS: 300.0000 mg | ORAL_CAPSULE | Freq: Every day | ORAL | Status: DC

## 2022-05-25 NOTE — Progress Notes (Signed)
Pt is voiding. Last time voided was 0200 and was 350 ml. PT voided 650 tonight as well. PVR after last void was 68ml. Pt refuses 0400 bladder scan at this time and states "I am peeing why do we keep doing this." RN will continue to monitor intake and output. No complications with new fistula and thrill is present.

## 2022-05-25 NOTE — Progress Notes (Addendum)
Pt has been accepted at [facility] on MWF. Pt can start on Monday and will need to arrive at 11:25 for 11:45 chair time. Dr Patton Salles is the accepting nephrologist. Met with pt at bedside to discuss arrangements. Pt agreeable to plan and information sheet provided to pt with clinic and schedule details noted. Arrangements added to AVS as well. Update provided to attending, nephrologist, pt's RN, and RN CM. Plan is for pt to d/c today and start at clinic on Monday. Spoke to Mooar with [facility] to make her aware that pt will d/c today and start at [facility] on Monday. Will fax d/c summary and last renal note to [facility] once completed for continuation of care. Pt requested assistance with contacting his managed medicaid transportation [phone]) through [employer]. Pt called transportation while navigator in the room to schedule transportation appts for next week (MWF). Pt advised that he will need to call next week to schedule appts for the next week or clinic staff may be able to assist once pt starts at clinic on Monday. Pt voices understanding.    Olivia Canter Renal Navigator [phone]  Addendum at 4:00 pm: D/C summary, today's renal note, and HD RN note faxed to [facility] with [facility] to provide to clinic.

## 2022-05-25 NOTE — Progress Notes (Signed)
Nephrology Follow-Up Consult note   Assessment/Recommendations: ASA Ryan Jenkins is a/an 62 y.o. male with a past medical history significant for CKD 5, DM2, HTN, recurrent MDR UTI, admitted for AKI/urinary retention, lower extremity edema.       AKI on CKD 5: Baseline creatinine around 3.9-4.6.  Follows with Dr. Ronalee Belts.  Presented with lower extremity edema and underwent diuresis.  Likely over diuresis plus some AKI related to infection and obstruction.  Also with some advancement of disease.  Discussed with primary nephrologist.  Patient has not been very compliant and he is worried he likely now has ESRD.  -Status post TDC with IR.  Appreciate help -Status post right upper extremity AVF with Dr. Karin Lieu on 5/9. Appreciate help -Continue with dialysis today -Follow-up clip process; still pending -Holding Lasix and ACE inhibitor -Continue to monitor daily Cr, Dose meds for GFR -Monitor Daily I/Os, Daily weight  -Maintain MAP>65 for optimal renal perfusion.  -Avoid nephrotoxic medications including NSAIDs -Use synthetic opioids (Fentanyl/Dilaudid) if needed  ESBL Klebsiella UTI: Antibiotics per primary team; now complete  Hypertension: Holding ACE inhibitor.  Continue amlodipine and beta-blocker.  Continue to monitor  Anemia due to CKD/chronic inflammation; obtain iron studies and consider esa.  Metabolic acidosis: Associated with AKI.  Minimal.  Likely will improve with dialysis  Hyperphosphatemia: Continue sevelamer.  Dialysis as above   Recommendations conveyed to primary service.    Darnell Level [facility] 05/25/2022 10:36 AM  ___________________________________________________________  CC: AKI  Interval History/Subjective: Patient continues to feel well.  Tolerated surgery yesterday with no issues.  Good thrill.  Awaiting acceptance for dialysis   Medications:  Current Facility-Administered Medications  Medication Dose Route Frequency Provider  Last Rate Last Admin   acetaminophen (TYLENOL) tablet 650 mg  650 mg Oral Q6H PRN Carollee Herter, DO   650 mg at 05/25/22 0120   Or   acetaminophen (TYLENOL) suppository 650 mg  650 mg Rectal Q6H PRN Carollee Herter, DO       amLODipine (NORVASC) tablet 10 mg  10 mg Oral Daily Carollee Herter, DO   10 mg at 05/24/22 1729   Chlorhexidine Gluconate Cloth 2 % PADS 6 each  6 each Topical Q0600 Darnell Level, MD   6 each at 05/25/22 0505   clopidogrel (PLAVIX) tablet 75 mg  75 mg Oral Daily Carollee Herter, DO   75 mg at 05/24/22 1726   famotidine (PEPCID) tablet 10 mg  10 mg Oral Daily Carollee Herter, DO   10 mg at 05/24/22 1726   finasteride (PROSCAR) tablet 5 mg  5 mg Oral Daily Carollee Herter, DO   5 mg at 05/24/22 1728   gabapentin (NEURONTIN) capsule 300 mg  300 mg Oral Daily Leandro Reasoner Tublu, MD   300 mg at 05/24/22 1728   guaiFENesin (ROBITUSSIN) 100 MG/5ML liquid 5 mL  5 mL Oral Q4H PRN Amin, Loura Halt, MD       heparin injection 5,000 Units  5,000 Units Subcutaneous Q8H Carollee Herter, DO   5,000 Units at 05/25/22 0121   hydrALAZINE (APRESOLINE) injection 10 mg  10 mg Intravenous Q4H PRN Amin, Ankit Chirag, MD       hydrALAZINE (APRESOLINE) tablet 100 mg  100 mg Oral TID Carollee Herter, DO   100 mg at 05/25/22 0123   HYDROcodone-acetaminophen (NORCO/VICODIN) 5-325 MG per tablet 1 tablet  1 tablet Oral Q6H PRN Narda Bonds, MD   1 tablet at 05/24/22 2254   insulin aspart (novoLOG) injection 0-5  Units  0-5 Units Subcutaneous QHS Carollee Herter, DO   2 Units at 05/17/22 2101   insulin aspart (novoLOG) injection 0-9 Units  0-9 Units Subcutaneous TID WC Carollee Herter, DO   1 Units at 05/25/22 0908   insulin glargine-yfgn (SEMGLEE) injection 5 Units  5 Units Subcutaneous QHS Carollee Herter, DO   5 Units at 05/24/22 2325   ipratropium-albuterol (DUONEB) 0.5-2.5 (3) MG/3ML nebulizer solution 3 mL  3 mL Nebulization Q4H PRN Amin, Loura Halt, MD       melatonin tablet 10 mg  10 mg Oral QHS PRN Carollee Herter, DO   10 mg at  05/24/22 2254   menthol-cetylpyridinium (CEPACOL) lozenge 3 mg  1 lozenge Oral PRN Berton Mount I, MD   3 mg at 05/18/22 1155   metoCLOPramide (REGLAN) tablet 5 mg  5 mg Oral TID AC & HS Carollee Herter, DO   5 mg at 05/25/22 0124   metoprolol succinate (TOPROL-XL) 24 hr tablet 12.5 mg  12.5 mg Oral Daily Carollee Herter, DO   12.5 mg at 05/24/22 1728   metoprolol tartrate (LOPRESSOR) injection 5 mg  5 mg Intravenous Q4H PRN Amin, Loura Halt, MD       mupirocin ointment (BACTROBAN) 2 % 1 Application  1 Application Nasal BID Narda Bonds, MD   1 Application at 05/24/22 2247   ondansetron (ZOFRAN) tablet 4 mg  4 mg Oral Q6H PRN Carollee Herter, DO   4 mg at 05/21/22 1739   Or   ondansetron (ZOFRAN) injection 4 mg  4 mg Intravenous Q6H PRN Carollee Herter, DO       pneumococcal 23 valent vaccine (PNEUMOVAX-23) injection 0.5 mL  0.5 mL Intramuscular Prior to discharge Barnetta Chapel, MD       polyethylene glycol (MIRALAX / GLYCOLAX) packet 17 g  17 g Oral Daily Carollee Herter, DO   17 g at 05/24/22 1728   rosuvastatin (CRESTOR) tablet 10 mg  10 mg Oral Daily Carollee Herter, DO   10 mg at 05/24/22 1727   senna-docusate (Senokot-S) tablet 1 tablet  1 tablet Oral QHS PRN Dimple Nanas, MD   1 tablet at 05/19/22 0518   sevelamer carbonate (RENVELA) tablet 800 mg  800 mg Oral TID WC Darnell Level, MD   800 mg at 05/24/22 1727   sodium bicarbonate tablet 1,300 mg  1,300 mg Oral BID Carollee Herter, DO   1,300 mg at 05/25/22 0124   tamsulosin (FLOMAX) capsule 0.4 mg  0.4 mg Oral QHS Carollee Herter, DO   0.4 mg at 05/24/22 2326   traZODone (DESYREL) tablet 50 mg  50 mg Oral QHS PRN Dimple Nanas, MD   50 mg at 05/24/22 2253      Review of Systems: 10 systems reviewed and negative except per interval history/subjective  Physical Exam: Vitals:   05/25/22 0501 05/25/22 0921  BP: 127/67 (!) 162/80  Pulse: 74 91  Resp: 18 19  Temp: 98.4 F (36.9 C) 98.4 F (36.9 C)  SpO2: 99% 96%   Total I/O In: 240  [P.O.:240] Out: 450 [Urine:450]  Intake/Output Summary (Last 24 hours) at 05/25/2022 1036 Last data filed at 05/25/2022 0900 Gross per 24 hour  Intake 550 ml  Output 1430 ml  Net -880 ml   Constitutional: well-appearing, no acute distress ENMT: ears and nose without scars or lesions, MMM CV: normal rate, no edema Respiratory: Bilateral chest rise, normal work of breathing Gastrointestinal: soft, non-tender, no palpable masses or hernias Skin:  no visible lesions or rashes Psych: alert, judgement/insight appropriate, appropriate mood and affect   Test Results I personally reviewed new and old clinical labs and radiology tests Lab Results  Component Value Date   NA 136 05/24/2022   K 4.1 05/24/2022   CL 103 05/24/2022   CO2 21 (L) 05/24/2022   BUN 84 (H) 05/24/2022   CREATININE 6.49 (H) 05/24/2022   CALCIUM 8.2 (L) 05/24/2022   ALBUMIN 2.4 (L) 05/23/2022   PHOS 8.3 (H) 05/23/2022    CBC Recent Labs  Lab 05/19/22 0219 05/24/22 0445  WBC 5.4 5.7  NEUTROABS 3.1  --   HGB 9.5* 9.5*  HCT 28.0* 27.4*  MCV 88.9 87.3  PLT 241 269

## 2022-05-25 NOTE — Plan of Care (Signed)
  Problem: Education: Goal: Ability to describe self-care measures that may prevent or decrease complications (Diabetes Survival Skills Education) will improve Outcome: Adequate for Discharge Goal: Individualized Educational Video(s) Outcome: Adequate for Discharge   Problem: Coping: Goal: Ability to adjust to condition or change in health will improve Outcome: Adequate for Discharge   Problem: Fluid Volume: Goal: Ability to maintain a balanced intake and output will improve Outcome: Adequate for Discharge   Problem: Health Behavior/Discharge Planning: Goal: Ability to identify and utilize available resources and services will improve Outcome: Adequate for Discharge Goal: Ability to manage health-related needs will improve Outcome: Adequate for Discharge   Problem: Metabolic: Goal: Ability to maintain appropriate glucose levels will improve Outcome: Adequate for Discharge   Problem: Nutritional: Goal: Maintenance of adequate nutrition will improve Outcome: Adequate for Discharge Goal: Progress toward achieving an optimal weight will improve Outcome: Adequate for Discharge   Problem: Skin Integrity: Goal: Risk for impaired skin integrity will decrease Outcome: Adequate for Discharge   Problem: Tissue Perfusion: Goal: Adequacy of tissue perfusion will improve Outcome: Adequate for Discharge   Problem: Education: Goal: Knowledge of General Education information will improve Description: Including pain rating scale, medication(s)/side effects and non-pharmacologic comfort measures Outcome: Adequate for Discharge   Problem: Health Behavior/Discharge Planning: Goal: Ability to manage health-related needs will improve Outcome: Adequate for Discharge   Problem: Clinical Measurements: Goal: Ability to maintain clinical measurements within normal limits will improve Outcome: Adequate for Discharge Goal: Will remain free from infection Outcome: Adequate for Discharge Goal:  Diagnostic test results will improve Outcome: Adequate for Discharge Goal: Respiratory complications will improve Outcome: Adequate for Discharge Goal: Cardiovascular complication will be avoided Outcome: Adequate for Discharge   Problem: Activity: Goal: Risk for activity intolerance will decrease Outcome: Adequate for Discharge   Problem: Nutrition: Goal: Adequate nutrition will be maintained Outcome: Adequate for Discharge   Problem: Coping: Goal: Level of anxiety will decrease Outcome: Adequate for Discharge   Problem: Elimination: Goal: Will not experience complications related to bowel motility Outcome: Adequate for Discharge Goal: Will not experience complications related to urinary retention Outcome: Adequate for Discharge   Problem: Pain Managment: Goal: General experience of comfort will improve Outcome: Adequate for Discharge   Problem: Safety: Goal: Ability to remain free from injury will improve Outcome: Adequate for Discharge   Problem: Skin Integrity: Goal: Risk for impaired skin integrity will decrease Outcome: Adequate for Discharge   Problem: Urinary Elimination: Goal: Signs and symptoms of infection will decrease Outcome: Adequate for Discharge   Problem: Education: Goal: Knowledge of disease and its progression will improve Outcome: Adequate for Discharge Goal: Individualized Educational Video(s) Outcome: Adequate for Discharge   Problem: Fluid Volume: Goal: Compliance with measures to maintain balanced fluid volume will improve Outcome: Adequate for Discharge   Problem: Health Behavior/Discharge Planning: Goal: Ability to manage health-related needs will improve Outcome: Adequate for Discharge   Problem: Nutritional: Goal: Ability to make healthy dietary choices will improve Outcome: Adequate for Discharge   Problem: Clinical Measurements: Goal: Complications related to the disease process, condition or treatment will be avoided or  minimized Outcome: Adequate for Discharge

## 2022-05-25 NOTE — Addendum Note (Signed)
Addendum  created 05/25/22 1604 by Alwyn Ren, CRNA   Clinical Note Signed

## 2022-05-25 NOTE — TOC Transition Note (Signed)
Transition of Care [facility]) - CM/SW Discharge Note   Patient Details  Name: Ryan Jenkins MRN: [id number] Date of Birth: October 29, 1960  [facility] CM/SW Contact:  Tom-Johnson, Hershal Coria, RN Phone Number: 05/25/2022, 4:36 PM   Clinical Narrative:     Patient is scheduled for discharge today.  Readmission Risk Assessment done.  Outpatient f/u, hospital f/u and discharge instructions on AVS. Sister, Burna Mortimer to transport at discharge.  No further TOC needs noted.        Final next level of care: Home w Home Health Services Barriers to Discharge: Barriers Resolved   Patient Goals and CMS Choice CMS Medicare.gov Compare Post Acute Care list provided to:: Patient Choice offered to / list presented to : NA  Discharge Placement                  Patient to be transferred to facility by: Sister Name of family member notified: [facility]    Discharge Plan and Services Additional resources added to the After Visit Summary for     Discharge Planning Services: CM Consult Post Acute Care Choice: NA          DME Arranged: N/A DME Agency: NA       HH Arranged: PT, RN (Active with [facility].) HH Agency: [facility] Date [facility] Agency Contacted: 05/16/22 Time HH Agency Contacted: 1415 Representative spoke with at [facility] Agency: Brandi  Social Determinants of Health (SDOH) Interventions SDOH Screenings   Food Insecurity: No Food Insecurity (05/15/2022)  Housing: Low Risk  (05/15/2022)  Transportation Needs: No Transportation Needs (05/15/2022)  Utilities: Not At Risk (05/15/2022)  Depression (PHQ2-9): Medium Risk (08/07/2019)  Tobacco Use: Low Risk  (05/25/2022)     Readmission Risk Interventions    05/18/2022    9:11 AM 04/05/2022   11:10 AM 03/26/2022    5:46 PM  Readmission Risk Prevention Plan  Transportation Screening Complete Complete Complete  Medication Review (RN Care Manager) Referral to Pharmacy Complete Referral to Pharmacy  PCP or Specialist  appointment within 3-5 days of discharge Complete Complete   HRI or Home Care Consult Complete Complete Complete  SW Recovery Care/Counseling Consult Complete Complete Complete  Palliative Care Screening Not Applicable Not Applicable Not Applicable  Skilled Nursing Facility Not Applicable Not Applicable Not Applicable

## 2022-05-25 NOTE — Progress Notes (Addendum)
      Right UE incision healing well no sign of infection or hematoma Right UE N/V/M intact Palpable thrill and radial pulse intact  S/P Right arm radiocephalic fistula  Stable post op fistula creation without symptoms of steal He will f/u in 5-6 weeks with duplex to check for maturity The fistula may not be accessed for 12 weeks.   Mosetta Pigeon PA-C [facility]   VASCULAR STAFF ADDENDUM: I have independently interviewed and examined the patient. I agree with the above.    Fara Olden, MD [facility] Phone Number: [phone] 05/25/2022 8:18 AM

## 2022-05-25 NOTE — Progress Notes (Signed)
New Dialysis Start    Patient identified as new dialysis start. Kidney Education packet assembled and given. Discussed the following items with patient:     Current medications and possible changes once started:  Discussed that patient's medications may change over time.  Ex; hypertension medications and diabetes medication.  Nephrologists will adjust as needed.   Fluid restrictions reviewed:  32 oz daily goal:  All liquids count; soups, ice, jello, fruits.    Phosphorus and potassium: Handout given showing high potassium and phosphorus foods.  Alternative food and drink options given.   Family support:  None at bedside, but stated that he has family support.   Outpatient Clinic Resources:  Discussed roles of Outpatient clinic staff and advised to make a list of needs, if any, to talk with outpatient staff if needed.   Care plan schedule: Informed patient of Care Plans in outpatient setting and to participate in the care plan.  An invitation would be given from outpatient clinic.    Dialysis Access Options:  Reviewed access options with patients. Discussed in detail about care at home with new AVG & AVF. Reviewed checking bruit and thrill. If dialysis catheter present, educated that patient could not take showers.  Catheter dressing changes were to be done by outpatient clinic staff only.   Home therapy options:  Educated patient about home therapy options:  PD vs home hemo.  Patient said that he is not interested in home dialysis.   Patient verbalized understanding. Will continue to round on patient during admission.    Jean Rosenthal Dialysis Nurse Coordinator [phone]

## 2022-05-25 NOTE — Discharge Summary (Signed)
Physician Discharge Summary   Patient: Ryan Jenkins MRN: [id number] DOB: [birth year]  Admit date:     05/15/2022  Discharge date: 05/25/22  Discharge Physician: Jacquelin Hawking, MD   PCP: Malka So., MD   Recommendations at discharge:  PCP follow-up Nephrology follow-up Vascular surgery follow-up Ongoing hemodialysis  Discharge Diagnoses: Active Problems:   CKD (chronic kidney disease) stage 5, GFR less than 15 ml/min (HCC)   DM2 (diabetes mellitus, type 2) (HCC)   Essential hypertension   HLD (hyperlipidemia)   Gastroesophageal reflux disease without esophagitis   Anemia in chronic kidney disease (CKD)   Chronic diastolic heart failure (HCC)   AKI (acute kidney injury) (HCC)  Principal Problem (Resolved):   Urinary tract infection due to ESBL Klebsiella  Hospital Course: Ryan Jenkins is a 62 y.o. male with a history of CKD stage V, diabetes mellitus type 2, hypertension, ESBL infection, GERD, hyperlipidemia, anemia of chronic kidney disease. Patient presented secondary to bilateral leg edema with associated AKI concerning for progressive CKD. Patient started on IV Lasix diuresis. Nephrology consulted. Patient also found to have ESBL Klebsiella pneumonia UTI, treated with meropenem. Nephrology recommendations for patient to transition to intermittent hemodialysis as an inpatient.  Tunneled dialysis catheter was placed on 5/8 with subsequent initiation of hemodialysis.  Vascular surgery.  Right forearm fistula on 5/9.  Assessment and Plan: * Urinary tract infection due to ESBL Klebsiella-resolved as of 05/23/2022 Urine culture significant for ESBL Klebsiella pneumoniae sensitive to imipenem. Patient completed a 5 day course of meropenem.  CKD (chronic kidney disease) stage 5, GFR less than 15 ml/min (HCC) Baseline creatinine of 3.6-4.6 per nephrology notes. Patient follows with nephrology and had plans prior to admission for AVF vs AVG placement per vascular  surgery.  Chronic diastolic heart failure (HCC) Stable. Fluid management will transition to per hemodialysis.  Anemia in chronic kidney disease (CKD) Chronic. Stable.  Gastroesophageal reflux disease without esophagitis Continue famotidine.  HLD (hyperlipidemia) Continue Crestor 10 mg daily.  Essential hypertension Mostly controlled. Patient is managed on lisinopril, amlodipine and metoprolol as an outpatient. Lisinopril discontinued this admission. Continue amlodipine and metoprolol succinate.  DM2 (diabetes mellitus, type 2) (HCC) Uncontrolled with hyperglycemia. Last hemoglobin A1C is improved to 7.4% from 8.4%. Patient is managed on glipizide and Lantus as an outpatient. Discontinue Glipizide and continue Lantus.  AKI (acute kidney injury) [facility]) Nephrology on board. Baseline creatinine of 3.6-4.6  per nephrology. Creatinine of 5.40 on admission which has continued to worsen in setting of severe renal impairment.  TDC placed on 5/8.  Hemodialysis started on 5/8.  Right forearm fistula created on 5/9. Patient set up with outpatient HD on MWF.    Consultants: Nephrology, Vascular surgery Procedures performed:  5/8: Tunneled HD catheter placement 5/9: Right arm radiocephalic fistula   Disposition: Home Diet recommendation: Renal diet   DISCHARGE MEDICATION: Allergies as of 05/25/2022       Reactions   Claritin [loratadine] Swelling, Other (See Comments)   Joint swelling   Hydrochlorothiazide Other (See Comments)   Dizziness   Latex Hives, Swelling   Lyrica [pregabalin] Other (See Comments)   Depression. "Makes me loopy"    Metformin And Related Diarrhea, Nausea And Vomiting        Medication List     STOP taking these medications    doxycycline 100 MG capsule Commonly known as: VIBRAMYCIN   furosemide 80 MG tablet Commonly known as: Lasix   glipiZIDE 5 MG tablet Commonly known as: GLUCOTROL   lisinopril  10 MG tablet Commonly known as: ZESTRIL    meclizine 25 MG tablet Commonly known as: ANTIVERT   potassium chloride 10 MEQ tablet Commonly known as: KLOR-CON M       TAKE these medications    amLODipine 10 MG tablet Commonly known as: NORVASC Take 1 tablet (10 mg total) by mouth daily.   clopidogrel 75 MG tablet Commonly known as: PLAVIX Take 75 mg by mouth daily.   famotidine 10 MG tablet Commonly known as: PEPCID Take 1 tablet (10 mg total) by mouth every Monday, Wednesday, and Friday with hemodialysis. Take after hemodialysis. What changed:  when to take this additional instructions   finasteride 5 MG tablet Commonly known as: PROSCAR Take 1 tablet (5 mg total) by mouth daily.   gabapentin 300 MG capsule Commonly known as: NEURONTIN Take 1 capsule (300 mg total) by mouth daily. What changed: when to take this   hydrALAZINE 100 MG tablet Commonly known as: APRESOLINE Take 100 mg by mouth 3 (three) times daily.   Lantus SoloStar 100 UNIT/ML Solostar Pen Generic drug: insulin glargine Inject 5 Units into the skin at bedtime.   metoCLOPramide 5 MG tablet Commonly known as: REGLAN Take 1 tablet (5 mg total) by mouth 4 (four) times daily -  before meals and at bedtime for 14 days. After 2 weeks, follow up with your PCP for further recommendations on how to take.  If your nausea improves prior to completing the 2 week course, you can use before meals and at bedtime only as needed.   metoprolol succinate 25 MG 24 hr tablet Commonly known as: TOPROL-XL Take 0.5 tablets (12.5 mg total) by mouth daily.   oxyCODONE 5 MG immediate release tablet Commonly known as: Oxy IR/ROXICODONE Take 5 mg by mouth every 8 (eight) hours as needed (for pain).   polyethylene glycol 17 g packet Commonly known as: MIRALAX / GLYCOLAX Take 17 g by mouth daily.   rosuvastatin 10 MG tablet Commonly known as: CRESTOR Take 10 mg by mouth daily.   senna-docusate 8.6-50 MG tablet Commonly known as: Senokot-S Take 1 tablet by  mouth at bedtime.   sodium bicarbonate 650 MG tablet Take 1,300 mg by mouth 2 (two) times daily.   tamsulosin 0.4 MG Caps capsule Commonly known as: FLOMAX Take 0.4 mg by mouth at bedtime.   triamcinolone ointment 0.5 % Commonly known as: KENALOG Apply 1 Application topically 2 (two) times daily as needed (for itching).   TYLENOL 500 MG tablet Generic drug: acetaminophen Take 500-1,000 mg by mouth every 6 (six) hours as needed for mild pain or headache.   Vitamin D3 1.25 MG (50000 UT) Caps Take 50,000 Units by mouth every Friday.        Follow-up Information     [facility].   Specialty: Home Health Services Why: Someone will call you to schedule first home visit. If you have not received a call after two days of discharging home, call their number listed. If no one comes to assess, call Case Manager at [phone]. Contact information: [address] [place] [address] [phone]         [facility]. Go on 05/28/2022.   Why: Schedule is Monday/Wednesday/Friday.  Arrive at 11:25 am for 11:45 am chair time.  Start on Monday, May 13. Contact information: [address] [place], [place] [address] [phone]        Malka So., MD. Schedule  an appointment as soon as possible for a visit in 1 week(s).   Specialty: Internal Medicine Why: For hospital follow-up Contact information: [address]urell [address] [facility] [place] [address] [phone]                Discharge Exam: BP (!) 171/88 (BP Location: Left Arm)   Pulse 84   Temp 98.3 F (36.8 C) (Oral)   Resp 15   Ht 6\' 2"  (1.88 m)   Wt 107.7 kg   SpO2 99%   BMI 30.48 kg/m   General exam: Appears calm and comfortable Respiratory system: Clear to auscultation. Respiratory effort normal. Cardiovascular system: S1 & S2 heard, RRR. No murmurs, rubs, gallops or clicks. Gastrointestinal system: Abdomen is distended,  soft and nontender. Normal bowel sounds heard. Central nervous system: Alert and oriented. No focal neurological deficits. Musculoskeletal: No edema. No calf tenderness Skin: No cyanosis. No rashes Psychiatry: Judgement and insight appear normal. Mood & affect appropriate.    Condition at discharge: stable  The results of significant diagnostics from this hospitalization (including imaging, microbiology, ancillary and laboratory) are listed below for reference.   Imaging Studies: IR Fluoro Guide CV Line Right  Result Date: 05/23/2022 CLINICAL DATA:  Progressive renal failure now requiring hemodialysis. Request to place a tunneled hemodialysis catheter. EXAM: TUNNELED CENTRAL VENOUS HEMODIALYSIS CATHETER PLACEMENT WITH ULTRASOUND AND FLUOROSCOPIC GUIDANCE ANESTHESIA/SEDATION: Moderate (conscious) sedation was employed during this procedure. A total of Versed 1.0 mg and Fentanyl 75 mcg was administered intravenously. Moderate Sedation Time: 22 minutes. The patient's level of consciousness and vital signs were monitored continuously by radiology nursing throughout the procedure under my direct supervision. MEDICATIONS: 2 g IV Ancef. FLUOROSCOPY: 24 seconds.  4.0 mGy. PROCEDURE: The procedure, risks, benefits, and alternatives were explained to the patient. Questions regarding the procedure were encouraged and answered. The patient understands and consents to the procedure. A timeout was performed prior to initiating the procedure. The right neck and chest were prepped with chlorhexidine in a sterile fashion, and a sterile drape was applied covering the operative field. Maximum barrier sterile technique with sterile gowns and gloves were used for the procedure. Local anesthesia was provided with 1% lidocaine. Ultrasound was used to confirm patency of the right internal jugular vein. A permanent ultrasound image was saved and recorded. After creating a small venotomy incision, a 21 gauge needle was advanced  into the right internal jugular vein under direct, real-time ultrasound guidance. Ultrasound image documentation was performed. After securing guidewire access, an 8 Fr dilator was placed. A J-wire was kinked to measure appropriate catheter length. A Palindrome tunneled hemodialysis catheter measuring 19 cm from tip to cuff was chosen for placement. This was tunneled in a retrograde fashion from the chest wall to the venotomy incision. At the venotomy, serial dilatation was performed and a 15 Fr peel-away sheath was placed over a guidewire. The catheter was then placed through the sheath and the sheath removed. Final catheter positioning was confirmed and documented with a fluoroscopic spot image. The catheter was aspirated, flushed with saline, and injected with appropriate volume heparin dwells. The venotomy incision was closed with subcuticular 4-0 Vicryl. Dermabond was applied to the incision. The catheter exit site was secured with 0-Prolene retention sutures. COMPLICATIONS: None.  No pneumothorax. FINDINGS: After catheter placement, the tip lies in the right atrium. The catheter aspirates normally and is ready for immediate use. IMPRESSION: Placement of tunneled hemodialysis catheter via the right internal jugular vein. The catheter tip lies in the  right atrium. The catheter is ready for immediate use. Electronically Signed   By: Irish Lack M.D.   On: 05/23/2022 14:50   IR US Guide Vasc Access Right  Result Date: 05/23/2022 CLINICAL DATA:  Progressive renal failure now requiring hemodialysis. Request to place a tunneled hemodialysis catheter. EXAM: TUNNELED CENTRAL VENOUS HEMODIALYSIS CATHETER PLACEMENT WITH ULTRASOUND AND FLUOROSCOPIC GUIDANCE ANESTHESIA/SEDATION: Moderate (conscious) sedation was employed during this procedure. A total of Versed 1.0 mg and Fentanyl 75 mcg was administered intravenously. Moderate Sedation Time: 22 minutes. The patient's level of consciousness and vital signs were  monitored continuously by radiology nursing throughout the procedure under my direct supervision. MEDICATIONS: 2 g IV Ancef. FLUOROSCOPY: 24 seconds.  4.0 mGy. PROCEDURE: The procedure, risks, benefits, and alternatives were explained to the patient. Questions regarding the procedure were encouraged and answered. The patient understands and consents to the procedure. A timeout was performed prior to initiating the procedure. The right neck and chest were prepped with chlorhexidine in a sterile fashion, and a sterile drape was applied covering the operative field. Maximum barrier sterile technique with sterile gowns and gloves were used for the procedure. Local anesthesia was provided with 1% lidocaine. Ultrasound was used to confirm patency of the right internal jugular vein. A permanent ultrasound image was saved and recorded. After creating a small venotomy incision, a 21 gauge needle was advanced into the right internal jugular vein under direct, real-time ultrasound guidance. Ultrasound image documentation was performed. After securing guidewire access, an 8 Fr dilator was placed. A J-wire was kinked to measure appropriate catheter length. A Palindrome tunneled hemodialysis catheter measuring 19 cm from tip to cuff was chosen for placement. This was tunneled in a retrograde fashion from the chest wall to the venotomy incision. At the venotomy, serial dilatation was performed and a 15 Fr peel-away sheath was placed over a guidewire. The catheter was then placed through the sheath and the sheath removed. Final catheter positioning was confirmed and documented with a fluoroscopic spot image. The catheter was aspirated, flushed with saline, and injected with appropriate volume heparin dwells. The venotomy incision was closed with subcuticular 4-0 Vicryl. Dermabond was applied to the incision. The catheter exit site was secured with 0-Prolene retention sutures. COMPLICATIONS: None.  No pneumothorax. FINDINGS: After  catheter placement, the tip lies in the right atrium. The catheter aspirates normally and is ready for immediate use. IMPRESSION: Placement of tunneled hemodialysis catheter via the right internal jugular vein. The catheter tip lies in the right atrium. The catheter is ready for immediate use. Electronically Signed   By: Irish Lack M.D.   On: 05/23/2022 14:50   US RENAL  Result Date: 05/20/2022 CLINICAL DATA:  [id number] Acute renal failure superimposed on stage 4 chronic kidney disease, unspecified acute renal failure type [facility]) [id number] EXAM: RENAL / URINARY TRACT ULTRASOUND COMPLETE COMPARISON:  March 28, 2022. April 04, 2022 FINDINGS: Right Kidney: Renal measurements: 11.3 x 7.3 x 4.5 cm = volume: 277 mL. Echogenicity is similar in comparison to prior. No mass or hydronephrosis visualized. Left Kidney: Renal measurements: 11.6 x 6.6 x 6.1 cm = volume: 243 mL. Echogenicity is similar in comparison to prior. No mass or hydronephrosis visualized. Bladder: Bladder wall is mildly prominent for degree of distension. Other: None. IMPRESSION: 1. No hydronephrosis. 2. Bladder wall is mildly prominent for degree of distension, similar in comparison to prior CT. This could reflect the sequela of chronic outlet obstruction. Correlate with urinalysis. Electronically Signed   By: Meda Klinefelter  M.D.   On: 05/20/2022 15:50   DG FEMUR MIN 2 VIEWS LEFT  Result Date: 05/18/2022 CLINICAL DATA:  Pain EXAM: LEFT FEMUR 2 VIEWS COMPARISON:  None Available. FINDINGS: No fracture or dislocation is seen. There are no focal lytic lesions. Small bony spurs are seen in the patella. There is calcification in medial and lateral meniscal cartilages. Extensive arterial calcifications are seen in soft tissues. IMPRESSION: No acute findings are seen in left femur. Degenerative changes are noted in the left knee with bony spurs and chondrocalcinosis. Extensive arterial calcifications are seen. Electronically Signed   By: Ernie Avena M.D.   On: 05/18/2022 12:29   DG Chest 2 View  Result Date: 05/15/2022 CLINICAL DATA:  Shortness of breath. EXAM: CHEST - 2 VIEW COMPARISON:  Chest radiographs 05/04/2022 FINDINGS: The cardiac silhouette is borderline enlarged convex situated by AP technique and low lung volumes. No airspace consolidation, edema, pleural effusion, or pneumothorax is identified. No acute osseous abnormality is seen. IMPRESSION: No active cardiopulmonary disease. Electronically Signed   By: Sebastian Ache M.D.   On: 05/15/2022 13:25   DG Chest 2 View  Result Date: 05/04/2022 CLINICAL DATA:  62 year old male with shortness of breath. Vomiting. EXAM: CHEST - 2 VIEW COMPARISON:  Chest and abdominal radiographs 04/04/2022 and earlier. FINDINGS: Upright AP and lateral views of the chest at 0714 hours. Mildly lower lung volumes. Mediastinal contours remain within normal limits. Both lungs appear clear. No pneumothorax or pleural effusion. Negative visible bowel gas. No evidence of pneumoperitoneum. Visualized tracheal air column is within normal limits. No osseous abnormality identified. IMPRESSION: Lower lung volumes. No acute cardiopulmonary abnormality. Electronically Signed   By: Odessa Fleming M.D.   On: 05/04/2022 07:21    Microbiology: Results for orders placed or performed during the hospital encounter of 05/15/22  Urine Culture     Status: Abnormal   Collection Time: 05/15/22  4:51 PM   Specimen: Urine, Clean Catch  Result Value Ref Range Status   Specimen Description URINE, CLEAN CATCH  Final   Special Requests NONE Reflexed from [id number]  Final   Culture (A)  Final    >=100,000 COLONIES/mL KLEBSIELLA PNEUMONIAE MULTI-DRUG RESISTANT ORGANISM CRITICAL RESULT CALLED TO, READ BACK BY AND VERIFIED WITH: PHARMD ADRIENNE Vincente Poli [id number] AT 1950 BY EC Performed at [facility], [address], [place], [place] [address]    Report Status 05/18/2022 FINAL  Final   Organism ID, Bacteria KLEBSIELLA  PNEUMONIAE (A)  Final      Susceptibility   Klebsiella pneumoniae - MIC*    AMPICILLIN >=32 RESISTANT Resistant     CEFAZOLIN >=64 RESISTANT Resistant     CEFEPIME >=32 RESISTANT Resistant     CEFTRIAXONE >=64 RESISTANT Resistant     CIPROFLOXACIN >=4 RESISTANT Resistant     GENTAMICIN <=1 SENSITIVE Sensitive     IMIPENEM <=0.25 SENSITIVE Sensitive     NITROFURANTOIN >=512 RESISTANT Resistant     TRIMETH/SULFA >=320 RESISTANT Resistant     AMPICILLIN/SULBACTAM >=32 RESISTANT Resistant     PIP/TAZO >=128 RESISTANT Resistant     * >=100,000 COLONIES/mL KLEBSIELLA PNEUMONIAE  Carbapenem Resistance Panel     Status: None   Collection Time: 05/15/22  4:51 PM  Result Value Ref Range Status   Carba Resistance IMP Gene NOT DETECTED NOT DETECTED Final   Carba Resistance VIM Gene NOT DETECTED NOT DETECTED Final   Carba Resistance NDM Gene NOT DETECTED NOT DETECTED Final   Carba Resistance KPC Gene NOT DETECTED NOT  DETECTED Final   Carba Resistance OXA48 Gene NOT DETECTED NOT DETECTED Final    Comment: (NOTE) Cepheid Carba-R is an FDA-cleared nucleic acid amplification test  (NAAT)for the detection and differentiation of genes encoding the  most prevalent carbapenemases in bacterial isolate samples. Carbapenemase gene identification and implementation of comprehensive  infection control measures are recommended by the CDC to prevent the  spread of the resistant organisms. Performed at [facility], [address], [place], [place] [address]   Culture, group A strep     Status: None   Collection Time: 05/18/22  9:43 AM   Specimen: Throat  Result Value Ref Range Status   Specimen Description THROAT  Final   Special Requests NONE  Final   Culture   Final    NO GROUP A STREP (S.PYOGENES) ISOLATED Performed at [facility], [address] [place], [place] [address]    Report Status 05/20/2022 FINAL  Final  Surgical PCR screen     Status: None   Collection Time: 05/23/22   8:40 PM   Specimen: Nasal Mucosa; Nasal Swab  Result Value Ref Range Status   MRSA, PCR NEGATIVE NEGATIVE Final   Staphylococcus aureus NEGATIVE NEGATIVE Final    Comment: (NOTE) The Xpert SA Assay (FDA approved for NASAL specimens in patients 34 years of age and older), is one component of a comprehensive surveillance program. It is not intended to diagnose infection nor to guide or monitor treatment. Performed at [facility], [address], [place], [place] [address]     Labs: CBC: Recent Labs  Lab 05/19/22 0219 05/24/22 0445 05/25/22 1155  WBC 5.4 5.7 6.6  NEUTROABS 3.1  --   --   HGB 9.5* 9.5* 9.6*  HCT 28.0* 27.4* 30.1*  MCV 88.9 87.3 92.6  PLT 241 269 273   Basic Metabolic Panel: Recent Labs  Lab 05/19/22 0219 05/20/22 1114 05/21/22 0157 05/22/22 0353 05/23/22 0208 05/24/22 0445 05/25/22 1155  NA 134* 133* 132* 133* 134* 136 136  K 5.0 5.0 4.9 4.9 4.9 4.1 4.2  CL 103 100 103 102 103 103 100  CO2 20* 19* 17* 18* 20* 21* 26  GLUCOSE 189* 138* 176* 136* 140* 117* 118*  BUN 101* 107* 114* 121* 120* 84* 51*  CREATININE 7.27* 7.75* 8.23* 8.34* 8.23* 6.49* 5.05*  CALCIUM 7.8* 8.2* 7.9* 8.1* 8.0* 8.2* 8.2*  MG 1.9  --   --   --   --   --   --   PHOS 7.7* 8.4* 8.5* 8.8* 8.3*  --  4.5   Liver Function Tests: Recent Labs  Lab 05/20/22 1114 05/21/22 0157 05/22/22 0353 05/23/22 0208 05/25/22 1155  ALBUMIN 2.6* 2.7* 2.4* 2.4* 2.7*   CBG: Recent Labs  Lab 05/24/22 1108 05/24/22 1148 05/24/22 1715 05/24/22 2112 05/25/22 0727  GLUCAP 118* 116* 98 178* 131*    Discharge time spent: 35 minutes.  Signed: Jacquelin Hawking, MD [facility] 05/25/2022

## 2022-05-25 NOTE — Progress Notes (Signed)
Received patient in bed to unit.  Alert and oriented.  Informed consent signed and in chart.   TX duration:3.5  Patient tolerated well.  Transported back to the room  Alert, without acute distress.  Hand-off given to patient's nurse.   Access used: right tdc Access issues: none  Total UF removed: 0 Medication(s) given: none    05/25/22 1517  Vitals  Temp 98.3 F (36.8 C)  Temp Source Oral  BP (!) 166/81  MAP (mmHg) 106  BP Location Left Arm  BP Method Automatic  Patient Position (if appropriate) Lying  Pulse Rate 76  Pulse Rate Source Monitor  ECG Heart Rate 78  Resp 12  Oxygen Therapy  SpO2 100 %  O2 Device Room Air  During Treatment Monitoring  HD Safety Checks Performed Yes  Intra-Hemodialysis Comments Tx completed;Tolerated well  Dialysis Fluid Bolus Normal Saline  Bolus Amount (mL) 300 mL    Gearold Wainer [facility]

## 2022-05-25 NOTE — Plan of Care (Signed)

## 2022-05-27 ENCOUNTER — Other Ambulatory Visit: Payer: Self-pay

## 2022-05-27 ENCOUNTER — Observation Stay (HOSPITAL_COMMUNITY)
Admission: EM | Admit: 2022-05-27 | Discharge: 2022-05-28 | Disposition: A | Discharge status: Home Health | Payer: Medicaid Other | Attending: Family Medicine | Admitting: Family Medicine

## 2022-05-27 ENCOUNTER — Emergency Department (HOSPITAL_COMMUNITY): Payer: Medicaid Other

## 2022-05-27 ENCOUNTER — Encounter (HOSPITAL_COMMUNITY): Payer: Self-pay

## 2022-05-27 DIAGNOSIS — E7849 Other hyperlipidemia: Secondary | ICD-10-CM

## 2022-05-27 DIAGNOSIS — N185 Chronic kidney disease, stage 5: Secondary | ICD-10-CM | POA: Diagnosis present

## 2022-05-27 DIAGNOSIS — Z992 Dependence on renal dialysis: Secondary | ICD-10-CM | POA: Insufficient documentation

## 2022-05-27 DIAGNOSIS — I5033 Acute on chronic diastolic (congestive) heart failure: Secondary | ICD-10-CM | POA: Diagnosis not present

## 2022-05-27 DIAGNOSIS — I2510 Atherosclerotic heart disease of native coronary artery without angina pectoris: Secondary | ICD-10-CM | POA: Diagnosis not present

## 2022-05-27 DIAGNOSIS — Z794 Long term (current) use of insulin: Secondary | ICD-10-CM

## 2022-05-27 DIAGNOSIS — N401 Enlarged prostate with lower urinary tract symptoms: Secondary | ICD-10-CM

## 2022-05-27 DIAGNOSIS — D72829 Elevated white blood cell count, unspecified: Secondary | ICD-10-CM | POA: Diagnosis not present

## 2022-05-27 DIAGNOSIS — I132 Hypertensive heart and chronic kidney disease with heart failure and with stage 5 chronic kidney disease, or end stage renal disease: Secondary | ICD-10-CM | POA: Insufficient documentation

## 2022-05-27 DIAGNOSIS — Z79899 Other long term (current) drug therapy: Secondary | ICD-10-CM | POA: Diagnosis not present

## 2022-05-27 DIAGNOSIS — E1122 Type 2 diabetes mellitus with diabetic chronic kidney disease: Secondary | ICD-10-CM | POA: Insufficient documentation

## 2022-05-27 DIAGNOSIS — N189 Chronic kidney disease, unspecified: Secondary | ICD-10-CM | POA: Diagnosis present

## 2022-05-27 DIAGNOSIS — K3184 Gastroparesis: Secondary | ICD-10-CM | POA: Diagnosis present

## 2022-05-27 DIAGNOSIS — I5032 Chronic diastolic (congestive) heart failure: Secondary | ICD-10-CM | POA: Diagnosis present

## 2022-05-27 DIAGNOSIS — R0902 Hypoxemia: Secondary | ICD-10-CM

## 2022-05-27 DIAGNOSIS — I5042 Chronic combined systolic (congestive) and diastolic (congestive) heart failure: Secondary | ICD-10-CM | POA: Insufficient documentation

## 2022-05-27 DIAGNOSIS — E1143 Type 2 diabetes mellitus with diabetic autonomic (poly)neuropathy: Secondary | ICD-10-CM | POA: Diagnosis present

## 2022-05-27 DIAGNOSIS — Z91040 Latex allergy status: Secondary | ICD-10-CM | POA: Diagnosis not present

## 2022-05-27 DIAGNOSIS — E119 Type 2 diabetes mellitus without complications: Secondary | ICD-10-CM

## 2022-05-27 DIAGNOSIS — F32A Depression, unspecified: Secondary | ICD-10-CM | POA: Diagnosis present

## 2022-05-27 DIAGNOSIS — R9431 Abnormal electrocardiogram [ECG] [EKG]: Secondary | ICD-10-CM | POA: Insufficient documentation

## 2022-05-27 DIAGNOSIS — E785 Hyperlipidemia, unspecified: Secondary | ICD-10-CM | POA: Diagnosis present

## 2022-05-27 DIAGNOSIS — K219 Gastro-esophageal reflux disease without esophagitis: Secondary | ICD-10-CM | POA: Diagnosis present

## 2022-05-27 DIAGNOSIS — N186 End stage renal disease: Secondary | ICD-10-CM | POA: Diagnosis not present

## 2022-05-27 DIAGNOSIS — I10 Essential (primary) hypertension: Secondary | ICD-10-CM | POA: Diagnosis present

## 2022-05-27 DIAGNOSIS — Z7902 Long term (current) use of antithrombotics/antiplatelets: Secondary | ICD-10-CM | POA: Insufficient documentation

## 2022-05-27 DIAGNOSIS — J9601 Acute respiratory failure with hypoxia: Principal | ICD-10-CM | POA: Diagnosis present

## 2022-05-27 DIAGNOSIS — D631 Anemia in chronic kidney disease: Secondary | ICD-10-CM | POA: Diagnosis present

## 2022-05-27 DIAGNOSIS — R5383 Other fatigue: Secondary | ICD-10-CM | POA: Diagnosis present

## 2022-05-27 DIAGNOSIS — N400 Enlarged prostate without lower urinary tract symptoms: Secondary | ICD-10-CM | POA: Diagnosis present

## 2022-05-27 DIAGNOSIS — I2583 Coronary atherosclerosis due to lipid rich plaque: Secondary | ICD-10-CM

## 2022-05-27 DIAGNOSIS — R3914 Feeling of incomplete bladder emptying: Secondary | ICD-10-CM

## 2022-05-27 LAB — COMPREHENSIVE METABOLIC PANEL
ALT: 7 U/L (ref 0–44)
AST: 20 U/L (ref 15–41)
Albumin: 3.2 g/dL — ABNORMAL LOW (ref 3.5–5.0)
Alkaline Phosphatase: 58 U/L (ref 38–126)
Anion gap: 16 — ABNORMAL HIGH (ref 5–15)
BUN: 34 mg/dL — ABNORMAL HIGH (ref 8–23)
CO2: 20 mmol/L — ABNORMAL LOW (ref 22–32)
Calcium: 8.6 mg/dL — ABNORMAL LOW (ref 8.9–10.3)
Chloride: 100 mmol/L (ref 98–111)
Creatinine, Ser: 4.41 mg/dL — ABNORMAL HIGH (ref 0.61–1.24)
GFR, Estimated: 14 mL/min — ABNORMAL LOW (ref >60)
Glucose, Bld: 154 mg/dL — ABNORMAL HIGH (ref 70–99)
Potassium: 3.8 mmol/L (ref 3.5–5.1)
Sodium: 136 mmol/L (ref 135–145)
Total Bilirubin: 0.9 mg/dL (ref 0.3–1.2)
Total Protein: 7.3 g/dL (ref 6.5–8.1)

## 2022-05-27 LAB — MAGNESIUM: Magnesium: 1.4 mg/dL — ABNORMAL LOW (ref 1.7–2.4)

## 2022-05-27 LAB — I-STAT VENOUS BLOOD GAS, ED
Acid-Base Excess: 0 mmol/L (ref 0.0–2.0)
Bicarbonate: 22.3 mmol/L (ref 20.0–28.0)
Calcium, Ion: 1.05 mmol/L — ABNORMAL LOW (ref 1.15–1.40)
HCT: 32 % — ABNORMAL LOW (ref 39.0–52.0)
Hemoglobin: 10.9 g/dL — ABNORMAL LOW (ref 13.0–17.0)
O2 Saturation: 92 %
Potassium: 3.9 mmol/L (ref 3.5–5.1)
Sodium: 138 mmol/L (ref 135–145)
TCO2: 23 mmol/L (ref 22–32)
pCO2, Ven: 29 mmHg — ABNORMAL LOW (ref 44–60)
pH, Ven: 7.494 — ABNORMAL HIGH (ref 7.25–7.43)
pO2, Ven: 58 mmHg — ABNORMAL HIGH (ref 32–45)

## 2022-05-27 LAB — URINALYSIS, W/ REFLEX TO CULTURE (INFECTION SUSPECTED)
Bacteria, UA: NONE SEEN
Bilirubin Urine: NEGATIVE
Glucose, UA: NEGATIVE mg/dL
Ketones, ur: 5 mg/dL — AB
Leukocytes,Ua: NEGATIVE
Nitrite: NEGATIVE
Protein, ur: >=300 mg/dL — AB
Specific Gravity, Urine: 1.012 (ref 1.005–1.030)
pH: 7 (ref 5.0–8.0)

## 2022-05-27 LAB — I-STAT CHEM 8, ED
BUN: 33 mg/dL — ABNORMAL HIGH (ref 8–23)
Calcium, Ion: 1.04 mmol/L — ABNORMAL LOW (ref 1.15–1.40)
Chloride: 104 mmol/L (ref 98–111)
Creatinine, Ser: 5.1 mg/dL — ABNORMAL HIGH (ref 0.61–1.24)
Glucose, Bld: 153 mg/dL — ABNORMAL HIGH (ref 70–99)
HCT: 32 % — ABNORMAL LOW (ref 39.0–52.0)
Hemoglobin: 10.9 g/dL — ABNORMAL LOW (ref 13.0–17.0)
Potassium: 4 mmol/L (ref 3.5–5.1)
Sodium: 138 mmol/L (ref 135–145)
TCO2: 22 mmol/L (ref 22–32)

## 2022-05-27 LAB — CBC WITH DIFFERENTIAL/PLATELET
Abs Immature Granulocytes: 0.05 10*3/uL (ref 0.00–0.07)
Basophils Absolute: 0.1 10*3/uL (ref 0.0–0.1)
Basophils Relative: 0 %
Eosinophils Absolute: 0.1 10*3/uL (ref 0.0–0.5)
Eosinophils Relative: 1 %
HCT: 31.4 % — ABNORMAL LOW (ref 39.0–52.0)
Hemoglobin: 10 g/dL — ABNORMAL LOW (ref 13.0–17.0)
Immature Granulocytes: 0 %
Lymphocytes Relative: 5 %
Lymphs Abs: 0.6 10*3/uL — ABNORMAL LOW (ref 0.7–4.0)
MCH: 29.4 pg (ref 26.0–34.0)
MCHC: 31.8 g/dL (ref 30.0–36.0)
MCV: 92.4 fL (ref 80.0–100.0)
Monocytes Absolute: 0.7 10*3/uL (ref 0.1–1.0)
Monocytes Relative: 6 %
Neutro Abs: 10.6 10*3/uL — ABNORMAL HIGH (ref 1.7–7.7)
Neutrophils Relative %: 88 %
Platelets: 272 10*3/uL (ref 150–400)
RBC: 3.4 MIL/uL — ABNORMAL LOW (ref 4.22–5.81)
RDW: 14.2 % (ref 11.5–15.5)
WBC: 12.1 10*3/uL — ABNORMAL HIGH (ref 4.0–10.5)
nRBC: 0 % (ref 0.0–0.2)

## 2022-05-27 LAB — BRAIN NATRIURETIC PEPTIDE: B Natriuretic Peptide: 1478.7 pg/mL — ABNORMAL HIGH (ref 0.0–100.0)

## 2022-05-27 LAB — CULTURE, BLOOD (ROUTINE X 2)
Culture: NO GROWTH
Special Requests: ADEQUATE

## 2022-05-27 LAB — APTT: aPTT: 30 seconds (ref 24–36)

## 2022-05-27 LAB — PROTIME-INR
INR: 1 (ref 0.8–1.2)
Prothrombin Time: 13.2 seconds (ref 11.4–15.2)

## 2022-05-27 LAB — LACTIC ACID, PLASMA: Lactic Acid, Venous: 0.9 mmol/L (ref 0.5–1.9)

## 2022-05-27 LAB — PROCALCITONIN: Procalcitonin: 0.11 ng/mL

## 2022-05-27 LAB — D-DIMER, QUANTITATIVE: D-Dimer, Quant: 1.94 ug/mL-FEU — ABNORMAL HIGH (ref 0.00–0.50)

## 2022-05-27 LAB — GLUCOSE, CAPILLARY: Glucose-Capillary: 94 mg/dL (ref 70–99)

## 2022-05-27 MED ORDER — POLYETHYLENE GLYCOL 3350 17 G PO PACK: 17.0000 g | PACK | Freq: Every day | ORAL | Status: DC | PRN

## 2022-05-27 MED ORDER — SODIUM CHLORIDE 0.9 % IV SOLN
2.0000 g | Freq: Once | INTRAVENOUS | Status: AC
  Administered 2022-05-27: 2 g via INTRAVENOUS
  Filled 2022-05-27: qty 12.5

## 2022-05-27 MED ORDER — HEPARIN SODIUM (PORCINE) 5000 UNIT/ML IJ SOLN
5000.0000 [IU] | Freq: Three times a day (TID) | INTRAMUSCULAR | Status: DC
  Administered 2022-05-27 – 2022-05-28 (×4): 5000 [IU] via SUBCUTANEOUS
  Filled 2022-05-27 (×4): qty 1

## 2022-05-27 MED ORDER — METOLAZONE 5 MG PO TABS
5.0000 mg | ORAL_TABLET | Freq: Once | ORAL | Status: AC
  Administered 2022-05-27: 5 mg via ORAL
  Filled 2022-05-27: qty 1

## 2022-05-27 MED ORDER — TAMSULOSIN HCL 0.4 MG PO CAPS
0.4000 mg | ORAL_CAPSULE | Freq: Every day | ORAL | Status: DC
  Administered 2022-05-27: 0.4 mg via ORAL
  Filled 2022-05-27: qty 1

## 2022-05-27 MED ORDER — ROSUVASTATIN CALCIUM 5 MG PO TABS
10.0000 mg | ORAL_TABLET | Freq: Every day | ORAL | Status: DC
  Administered 2022-05-28: 10 mg via ORAL
  Filled 2022-05-27: qty 2

## 2022-05-27 MED ORDER — CHLORHEXIDINE GLUCONATE CLOTH 2 % EX PADS
6.0000 | MEDICATED_PAD | Freq: Every day | CUTANEOUS | Status: DC
  Administered 2022-05-28: 6 via TOPICAL

## 2022-05-27 MED ORDER — VANCOMYCIN HCL 2000 MG/400ML IV SOLN
2000.0000 mg | Freq: Once | INTRAVENOUS | Status: AC
  Administered 2022-05-27: 2000 mg via INTRAVENOUS
  Filled 2022-05-27: qty 400

## 2022-05-27 MED ORDER — FAMOTIDINE 20 MG PO TABS
10.0000 mg | ORAL_TABLET | ORAL | Status: DC
  Administered 2022-05-28: 10 mg via ORAL
  Filled 2022-05-27 (×2): qty 1

## 2022-05-27 MED ORDER — IOHEXOL 350 MG/ML SOLN
75.0000 mL | Freq: Once | INTRAVENOUS | Status: AC | PRN
  Administered 2022-05-27: 75 mL via INTRAVENOUS

## 2022-05-27 MED ORDER — ONDANSETRON HCL 4 MG/2ML IJ SOLN
4.0000 mg | Freq: Once | INTRAMUSCULAR | Status: AC
  Administered 2022-05-27: 4 mg via INTRAVENOUS
  Filled 2022-05-27: qty 2

## 2022-05-27 MED ORDER — ACETAMINOPHEN 650 MG RE SUPP: 650.0000 mg | Freq: Four times a day (QID) | RECTAL | Status: DC | PRN

## 2022-05-27 MED ORDER — METOPROLOL SUCCINATE ER 25 MG PO TB24
12.5000 mg | ORAL_TABLET | Freq: Every day | ORAL | Status: DC
  Administered 2022-05-28: 12.5 mg via ORAL
  Filled 2022-05-27: qty 1

## 2022-05-27 MED ORDER — VANCOMYCIN HCL IN DEXTROSE 1-5 GM/200ML-% IV SOLN: 1000.0000 mg | Freq: Once | INTRAVENOUS | Status: DC

## 2022-05-27 MED ORDER — METRONIDAZOLE 500 MG/100ML IV SOLN
500.0000 mg | Freq: Once | INTRAVENOUS | Status: AC
  Administered 2022-05-27: 500 mg via INTRAVENOUS
  Filled 2022-05-27: qty 100

## 2022-05-27 MED ORDER — GABAPENTIN 300 MG PO CAPS
300.0000 mg | ORAL_CAPSULE | Freq: Every day | ORAL | Status: DC
  Administered 2022-05-28: 300 mg via ORAL
  Filled 2022-05-27: qty 1

## 2022-05-27 MED ORDER — AMLODIPINE BESYLATE 10 MG PO TABS
10.0000 mg | ORAL_TABLET | Freq: Every day | ORAL | Status: DC
  Administered 2022-05-28: 10 mg via ORAL
  Filled 2022-05-27: qty 1

## 2022-05-27 MED ORDER — ACETAMINOPHEN 325 MG PO TABS
650.0000 mg | ORAL_TABLET | Freq: Once | ORAL | Status: AC
  Administered 2022-05-27: 650 mg via ORAL
  Filled 2022-05-27: qty 2

## 2022-05-27 MED ORDER — SODIUM CHLORIDE 0.9% FLUSH
3.0000 mL | Freq: Two times a day (BID) | INTRAVENOUS | Status: DC
  Administered 2022-05-27 – 2022-05-28 (×3): 3 mL via INTRAVENOUS

## 2022-05-27 MED ORDER — ACETAMINOPHEN 325 MG PO TABS
650.0000 mg | ORAL_TABLET | Freq: Four times a day (QID) | ORAL | Status: DC | PRN
  Administered 2022-05-27 – 2022-05-28 (×3): 650 mg via ORAL
  Filled 2022-05-27 (×3): qty 2

## 2022-05-27 MED ORDER — FINASTERIDE 5 MG PO TABS
5.0000 mg | ORAL_TABLET | Freq: Every day | ORAL | Status: DC
  Administered 2022-05-28: 5 mg via ORAL
  Filled 2022-05-27: qty 1

## 2022-05-27 MED ORDER — FUROSEMIDE 10 MG/ML IJ SOLN
160.0000 mg | Freq: Four times a day (QID) | INTRAVENOUS | Status: AC
  Administered 2022-05-27 (×2): 160 mg via INTRAVENOUS
  Filled 2022-05-27: qty 10
  Filled 2022-05-27 (×3): qty 16

## 2022-05-27 MED ORDER — HYDRALAZINE HCL 50 MG PO TABS
100.0000 mg | ORAL_TABLET | Freq: Three times a day (TID) | ORAL | Status: DC
  Administered 2022-05-27 (×2): 100 mg via ORAL
  Filled 2022-05-27 (×2): qty 2

## 2022-05-27 NOTE — ED Provider Notes (Signed)
[place] EMERGENCY DEPARTMENT AT [facility] Provider Note  CSN: [id number] Arrival date & time: 05/27/22 5284  Chief Complaint(s) Post-op Problem  HPI Ryan Jenkins is a 62 y.o. male with extensive past medical history listed below including hypertension, hyperlipidemia, diabetes, CKD stage V recently started on dialysis through a right chest wall permacath in place on May 9.  Patient also had a right arm AV fistula surgery at the same time.  He was discharged from the hospital on May 10 May 10.  During the admission patient was treated for UTI due to ESBL Klebsiella sensitive to imipenem.  He completed a 5-day course of meropenem during admission.  He presents today for generalized fatigue and nausea vomiting.  He reports that since being discharged from the hospital he has had severe generalized fatigue.  Nausea and vomiting began this evening.  He denies any chest pain.  Does report some right upper shoulder pain radiating to the right arm that is intermittent.  Currently denies any pain at the moment.  He denies any associated shortness of breath.  He does report lower extremity edema.  No reported fevers but has chills.  No coughing or congestion.  HPI  Past Medical History Past Medical History:  Diagnosis Date  . Anemia   . Anginal pain (HCC)   . Anxiety   . Arthritis   . BPH with obstruction/lower urinary tract symptoms   . CAD (coronary artery disease)    cardiologist--- dr Lucienne Minks badal;  11-06-2019 cardiac cath in [place] (result in care everywhere)  nonobstructive cad involing pRCA 60% (done in setting worseing CHF/ acute pulmonary edema requiring intubation/ AKI  . Chronic combined systolic and diastolic CHF (congestive heart failure) (HCC)   . Chronic kidney disease, stage IV (severe) [facility])    nephrologist--- dr Ronalee Belts  . Depression   . Diabetic neuropathy (HCC)   . Dyspnea   . Edema of both lower extremities   . Gastric ulcer without hemorrhage or  perforation 12/25/2018  . GERD (gastroesophageal reflux disease)   . Heart murmur   . History of community acquired pneumonia    ad[facility]-04-20[facility]  w/ ARF hypoxia w/ severe sepsis  . Hyperlipidemia   . Hypertension   . Insulin dependent type 2 diabetes mellitus (HCC)   . LBBB (left bundle branch block)   . Pneumonia   . 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 Uses walker   . Vitamin B12 deficiency   . Vitreous hemorrhage [facility])    Patient Active Problem List   Diagnosis Date Noted  . AKI (acute kidney injury) (HCC) 05/23/2022  . CKD (chronic kidney disease) stage 5, GFR less than 15 ml/min (HCC) 04/24/2022  . Diabetic gastroparesis (HCC) 04/09/2022  . Chronic diastolic heart failure (HCC) 04/04/2022  . Anemia in chronic kidney disease (CKD) 10/18/2021  . BPH (benign prostatic hyperplasia) 10/16/2021  . Coronary artery disease 02/07/2021  . Erectile dysfunction associated with type 2 diabetes mellitus (HCC) 08/07/2019  . Vitreous hemorrhage of left eye (HCC) 07/22/2019  . Vision loss of left eye 07/22/2019  . History of medication noncompliance 04/02/2019  . Gastroesophageal reflux disease without esophagitis 09/04/2018  . Diabetic retinopathy of both eyes associated with type 2 diabetes mellitus (HCC) 01/03/2018  . Intermittent diarrhea 09/27/2017  . Gastroparesis 09/27/2017  . Macroalbuminuric diabetic nephropathy (HCC) 06/25/2017  . History of falling 06/25/2017  . HLD (hyperlipidemia) 03/21/2017  . Vitamin B 12 deficiency 03/21/2017  . DM2 (diabetes  mellitus, type 2) (HCC) 02/05/2017  . Essential hypertension 02/05/2017  . Depression 02/05/2017  . Unintended weight loss 02/05/2017  . Gait disturbance 02/05/2017  . 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 Diabetic neuropathy, type II diabetes mellitus (HCC) 05/11/2014  . Metatarsal deformity 05/11/2014   Home Medication(s) Prior to Admission medications   Medication Sig Start Date End Date  Taking? Authorizing Provider  amLODipine (NORVASC) 10 MG tablet Take 1 tablet (10 mg total) by mouth daily. 02/08/21   Danford, Earl Lites, MD  Cholecalciferol (VITAMIN D3) 1.25 MG (50000 UT) CAPS Take 50,000 Units by mouth every Friday. 09/12/21   [provider]  clopidogrel (PLAVIX) 75 MG tablet Take 75 mg by mouth daily. 05/10/22   [provider]  famotidine (PEPCID) 10 MG tablet Take 1 tablet (10 mg total) by mouth every Monday, Wednesday, and Friday with hemodialysis. Take after hemodialysis. 05/25/22 06/24/22  Narda Bonds, MD  finasteride (PROSCAR) 5 MG tablet Take 1 tablet (5 mg total) by mouth daily. 07/20/20   Hoy Register, MD  gabapentin (NEURONTIN) 300 MG capsule Take 1 capsule (300 mg total) by mouth daily. 05/25/22   Narda Bonds, MD  hydrALAZINE (APRESOLINE) 100 MG tablet Take 100 mg by mouth 3 (three) times daily. 04/25/22   [provider]  LANTUS SOLOSTAR 100 UNIT/ML Solostar Pen Inject 5 Units into the skin at bedtime. 03/29/22   Lorin Glass, MD  metoCLOPramide (REGLAN) 5 MG tablet Take 1 tablet (5 mg total) by mouth 4 (four) times daily -  before meals and at bedtime for 14 days. After 2 weeks, follow up with your PCP for further recommendations on how to take.  If your nausea improves prior to completing the 2 week course, you can use before meals and at bedtime only as needed. 04/09/22 05/15/22  Zigmund Daniel., MD  metoprolol succinate (TOPROL-XL) 25 MG 24 hr tablet Take 0.5 tablets (12.5 mg total) by mouth daily. 03/30/22 06/28/22  Lorin Glass, MD  oxyCODONE (OXY IR/ROXICODONE) 5 MG immediate release tablet Take 5 mg by mouth every 8 (eight) hours as needed (for pain).    [provider]  polyethylene glycol (MIRALAX / GLYCOLAX) 17 g packet Take 17 g by mouth daily. 03/30/22   Lorin Glass, MD  rosuvastatin (CRESTOR) 10 MG tablet Take 10 mg by mouth daily.    [provider]  senna-docusate (SENOKOT-S) 8.6-50 MG tablet Take  1 tablet by mouth at bedtime. Patient not taking: Reported on 05/15/2022 03/29/22 06/27/22  Lorin Glass, MD  sodium bicarbonate 650 MG tablet Take 1,300 mg by mouth 2 (two) times daily. 09/20/21   [provider]  tamsulosin (FLOMAX) 0.4 MG CAPS capsule Take 0.4 mg by mouth at bedtime. 05/25/21   [provider]  triamcinolone ointment (KENALOG) 0.5 % Apply 1 Application topically 2 (two) times daily as needed (for itching). 03/27/22   [provider]  TYLENOL 500 MG tablet Take 500-1,000 mg by mouth every 6 (six) hours as needed for mild pain or headache.    [provider]  Allergies Claritin [loratadine], Hydrochlorothiazide, Latex, Lyrica [pregabalin], and Metformin and related  Review of Systems Review of Systems As noted in HPI  Physical Exam Vital Signs  I have reviewed the triage vital signs BP (!) 179/80 (BP Location: Left Arm)   Pulse (!) 106   Temp 98.8 F (37.1 C) (Oral)   Resp 19   Ht 6\' 2"  (1.88 m)   Wt 107.7 kg   SpO2 95%   BMI 30.48 kg/m   Physical Exam Vitals reviewed.  Constitutional:      General: He is not in acute distress.    Appearance: He is well-developed. He is not diaphoretic.  HENT:     Head: Normocephalic and atraumatic.     Nose: Nose normal.  Eyes:     General: No scleral icterus.       Right eye: No discharge.        Left eye: No discharge.     Conjunctiva/sclera: Conjunctivae normal.     Pupils: Pupils are equal, round, and reactive to light.  Cardiovascular:     Rate and Rhythm: Normal rate and regular rhythm.     Heart sounds: No murmur heard.    No friction rub. No gallop.  Pulmonary:     Effort: Pulmonary effort is normal. No respiratory distress.     Breath sounds: Normal breath sounds. No stridor. No rales.  Chest:    Abdominal:     General: There is no distension.      Palpations: Abdomen is soft.     Tenderness: There is no abdominal tenderness. There is no guarding or rebound.  Musculoskeletal:        General: No tenderness.       Arms:     Cervical back: Normal range of motion and neck supple.     Right lower leg: 1+ Pitting Edema present.     Left lower leg: 1+ Pitting Edema present.  Skin:    General: Skin is warm and dry.     Findings: No erythema or rash.  Neurological:     Mental Status: He is alert and oriented to person, place, and time.     ED Results and Treatments Labs (all labs ordered are listed, but only abnormal results are displayed) Labs Reviewed  I-STAT CHEM 8, ED - Abnormal; Notable for the following components:      Result Value   BUN 33 (*)    Creatinine, Ser 5.10 (*)    Glucose, Bld 153 (*)    Calcium, Ion 1.04 (*)    Hemoglobin 10.9 (*)    HCT 32.0 (*)    All other components within normal limits  I-STAT VENOUS BLOOD GAS, ED - Abnormal; Notable for the following components:   pH, Ven 7.494 (*)    pCO2, Ven 29.0 (*)    pO2, Ven 58 (*)    Calcium, Ion 1.05 (*)    HCT 32.0 (*)    Hemoglobin 10.9 (*)    All other components within normal limits  CULTURE, BLOOD (ROUTINE X 2)  CULTURE, BLOOD (ROUTINE X 2)  LACTIC ACID, PLASMA  LACTIC ACID, PLASMA  COMPREHENSIVE METABOLIC PANEL  CBC WITH DIFFERENTIAL/PLATELET  PROTIME-INR  APTT  URINALYSIS, W/ REFLEX TO CULTURE (INFECTION SUSPECTED)  EKG  EKG Interpretation  Date/Time:  Sunday May 27 2022 05:40:44 EDT Ventricular Rate:  106 PR Interval:  173 QRS Duration: 104 QT Interval:  379 QTC Calculation: 504 R Axis:   -39 Text Interpretation: Sinus tachycardia Probable left atrial enlargement Left axis deviation Anterior infarct, old Nonspecific repol abnormality, lateral leads Prolonged QT interval  Otherwise no significant change  Confirmed by  Drema Pry [phone]) on 05/27/2022 5:50:45 AM       Radiology DG Chest Port 1 View  Result Date: 05/27/2022 CLINICAL DATA:  Questionable sepsis EXAM: PORTABLE CHEST 1 VIEW COMPARISON:  05/15/2022 FINDINGS: Perma catheter with tip at the upper cavoatrial junction. Borderline heart size. There is no edema, consolidation, effusion, or pneumothorax. IMPRESSION: Negative for pneumonia. Electronically Signed   By: Tiburcio Pea M.D.   On: 05/27/2022 06:38    Medications Ordered in ED Medications  ondansetron (ZOFRAN) injection 4 mg (4 mg Intravenous Given 05/27/22 0622)  acetaminophen (TYLENOL) tablet 650 mg (650 mg Oral Given 05/27/22 6045)                                                                                                                                     Procedures Procedures  (including critical care time)  Medical Decision Making / ED Course  Click here for ABCD2, HEART and other calculators  Medical Decision Making Amount and/or Complexity of Data Reviewed Labs: ordered. Radiology: ordered. ECG/medicine tests: ordered.  Risk OTC drugs. Prescription drug management.    This patient presents to the ED for: Generalized fatigue Nausea vomiting   Key initial findings: Patient is extremely warm to touch.  Noted to have low-grade oral fever. PermCath sites and AV fistula site do not appear to be infected. Abdomen is benign. Patient also noted to be hypoxic on room air satting 86%.  Placed on 2 L nasal cannula.  Additional history obtained: Notes from recent discharge and surgical note.  Presentations involves an extensive number of treatment options, and is a complaint that carries with it a high risk of complications and morbidity. The differential diagnosis includes but not limited to:  Infectious etiology.  Most concerning for recurrent UTI or bacteremia. Will assess for PNA Also need to check for pulmonary edema.  Will need to consider pulmonary embolism  as well.  Hospitalization considered: Yes  Initial intervention:  Zofran Tylenol   Work up Interpretation and Management:  Cardiac Monitoring/EKG: Telemetry with sinus tachycardia. EKG with sinus tach.  No acute ischemic changes, dysrhythmias, blocks.  Laboratory Tests ordered listed below with my independent interpretation:    Imaging Studies ordered listed below with my independent interpretation:  Clinical Course as of 05/27/22 0730  Sun May 27, 2022  4098 Chest x-ray without evidence for pneumonia, pneumothorax, pulmonary edema or pleural effusions. [PC]  1191 I-STAT Chem-8 without significant electrolyte derangements.  Baseline renal function.  Baseline hemoglobin. [PC]  0700 CBC with leukocytosis. Stable Hb. [  PC]  0708 Started on empiric Abx [PC]  0729 Consulted with nephrology given possible need for CT imaging or dialysis.  He recommended obtaining a D-dimer.  If positive obtaining a CTA to rule out a PE.  Dr. Burgess Estelle evaluated the patient in the emergency department.  He will give him a dose of Lasix.  No plan for dialysis today. [PC]    Clinical Course User Index [PC] Tayen Narang, Amadeo Garnet, MD   Patient care turned over to oncoming provider. Patient case and results discussed in detail; please see their note for further ED managment.      Final Clinical Impression(s) / ED Diagnoses Final diagnoses:  None           This chart was dictated using voice recognition software.  Despite best efforts to proofread,  errors can occur which can change the documentation meaning.    Nira Conn, MD 05/27/22 [phone]

## 2022-05-27 NOTE — Progress Notes (Signed)
Pt received to 5M13 from ED.  Pt oriented to room and call bell.  Pt denies pain or distress at this time.  Pt denies nausea.  See admit data base, assessment, v/s's.  NSR 94.

## 2022-05-27 NOTE — ED Triage Notes (Signed)
Pt from home BIB [facility] c/o n/v and generalized aches. Pt had recent dialysis fistula placed on 5/9 to right upper chest, pt has CKD. Recent dialysis treatment was Friday 5/10. Pt c/o CP at the new AV access site, EMS gave pt 4 mg Zofran, 324 ASA, and 0.4 mg NTG.  Placed on 2L [place] for comfort. Pt A&O x4,

## 2022-05-27 NOTE — Plan of Care (Signed)

## 2022-05-27 NOTE — ED Notes (Signed)
Patient transported to CT 

## 2022-05-27 NOTE — ED Notes (Signed)
ED TO INPATIENT HANDOFF REPORT  ED Nurse Name and Phone #: [phone]  S Name/Age/Gender Ryan Jenkins 62 y.o. male Room/Bed: 022C/022C  Code Status   Code Status: Full Code  Home/SNF/Other Home Patient oriented to: self, place, time, and situation Is this baseline? Yes   Triage Complete: Triage complete  Chief Complaint Acute respiratory failure with hypoxia (HCC) [J96.01]  Triage Note Pt from home BIB [facility] c/o n/v and generalized aches. Pt had recent dialysis fistula placed on 5/9 to right upper chest, pt has CKD. Recent dialysis treatment was Friday 5/10. Pt c/o CP at the new AV access site, EMS gave pt 4 mg Zofran, 324 ASA, and 0.4 mg NTG.  Placed on 2L [place] for comfort. Pt A&O x4,    Allergies Allergies  Allergen Reactions   Claritin [Loratadine] Swelling and Other (See Comments)    Joint swelling    Hydrochlorothiazide Other (See Comments)    Dizziness   Latex Hives and Swelling   Lyrica [Pregabalin] Other (See Comments)    Depression. "Makes me loopy"    Metformin And Related Diarrhea and Nausea And Vomiting    Level of Care/Admitting Diagnosis ED Disposition     ED Disposition  Admit   Condition  --   Comment  Hospital Area: [facility] [[id number]]  Level of Care: Telemetry Medical [104]  May place patient in observation at [facility] or [facility] if equivalent level of care is available:: No  Covid Evaluation: Asymptomatic - no recent exposure (last 10 days) testing not required  Diagnosis: Acute respiratory failure with hypoxia [facility]) [[id number]]  Admitting Physician: Synetta Fail [[id number]]  Attending Physician: Synetta Fail [[id number]]          B Medical/Surgery History Past Medical History:  Diagnosis Date   Anemia    Anginal pain (HCC)    Anxiety    Arthritis    BPH with obstruction/lower urinary tract symptoms    CAD (coronary artery disease)    cardiologist--- dr Lucienne Minks badal;  11-06-2019 cardiac cath in  [place] (result in care everywhere)  nonobstructive cad involing pRCA 60% (done in setting worseing CHF/ acute pulmonary edema requiring intubation/ AKI   Chronic combined systolic and diastolic CHF (congestive heart failure) (HCC)    Chronic kidney disease, stage IV (severe) (HCC)    nephrologist--- dr Ronalee Belts   Depression    Diabetic neuropathy (HCC)    Dyspnea    Edema of both lower extremities    Gastric ulcer without hemorrhage or perforation 12/25/2018   GERD (gastroesophageal reflux disease)    Heart murmur    History of community acquired pneumonia    ad[facility]-2021 [facility]  w/ ARF hypoxia w/ severe sepsis   Hyperlipidemia    Hypertension    Insulin dependent type 2 diabetes mellitus (HCC)    LBBB (left bundle branch block)    Pneumonia    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)    Uses walker    Vitamin B12 deficiency    Vitreous hemorrhage (HCC)    Past Surgical History:  Procedure Laterality Date   AV FISTULA PLACEMENT Right 05/24/2022   Procedure: RADIOCEPHALIC ARTERIOVENOUS (AV) FISTULA CREATION;  Surgeon: Victorino Sparrow, MD;  [facility];  Service: Vascular;  Laterality: Right;   CARDIAC CATHETERIZATION  11/06/2019   [facility] in [place];    nonobstructive CAD , pRCA 60% (result in care everywhere)   CATARACT EXTRACTION W/ INTRAOCULAR LENS IMPLANT Bilateral 2017  IR FLUORO GUIDE CV LINE RIGHT  05/23/2022   IR US GUIDE VASC ACCESS RIGHT  05/23/2022   TRANSURETHRAL RESECTION OF PROSTATE N/A 10/16/2021   Procedure: TRANSURETHRAL RESECTION OF THE PROSTATE (TURP);  Surgeon: Jannifer Hick, MD;  Location: [facility] ORS;  Service: Urology;  Laterality: N/A;     A IV Location/Drains/Wounds Patient Lines/Drains/Airways Status     Active Line/Drains/Airways     Name Placement date Placement time Site Days   Peripheral IV 05/27/22 20 G Left;Posterior Hand 05/27/22  0525  Hand  less than 1   Peripheral IV 05/27/22 20 G 1.88" Left;Anterior;Upper  Forearm 05/27/22  0945  Forearm  less than 1   Fistula / Graft Right Upper arm Arteriovenous fistula 05/24/22  1042  Upper arm  3   Hemodialysis Catheter Right Subclavian Double lumen Permanent (Tunneled) 05/23/22  1230  Subclavian  4            Intake/Output Last 24 hours  Intake/Output Summary (Last 24 hours) at 05/27/2022 1340 Last data filed at 05/27/2022 1316 Gross per 24 hour  Intake 197.19 ml  Output 500 ml  Net -302.81 ml    Labs/Imaging Results for orders placed or performed during the hospital encounter of 05/27/22 (from the past 48 hour(s))  Comprehensive metabolic panel     Status: Abnormal   Collection Time: 05/27/22  6:30 AM  Result Value Ref Range   Sodium 136 135 - 145 mmol/L   Potassium 3.8 3.5 - 5.1 mmol/L   Chloride 100 98 - 111 mmol/L   CO2 20 (L) 22 - 32 mmol/L   Glucose, Bld 154 (H) 70 - 99 mg/dL    Comment: Glucose reference range applies only to samples taken after fasting for at least 8 hours.   BUN 34 (H) 8 - 23 mg/dL   Creatinine, Ser 1.61 (H) 0.61 - 1.24 mg/dL   Calcium 8.6 (L) 8.9 - 10.3 mg/dL   Total Protein 7.3 6.5 - 8.1 g/dL   Albumin 3.2 (L) 3.5 - 5.0 g/dL   AST 20 15 - 41 U/L   ALT 7 0 - 44 U/L   Alkaline Phosphatase 58 38 - 126 U/L   Total Bilirubin 0.9 0.3 - 1.2 mg/dL   GFR, Estimated 14 (L) >60 mL/min    Comment: (NOTE) Calculated using the CKD-EPI Creatinine Equation (2021)    Anion gap 16 (H) 5 - 15    Comment: Performed at [facility], [address] [place], [place] [address]  CBC with Differential     Status: Abnormal   Collection Time: 05/27/22  6:30 AM  Result Value Ref Range   WBC 12.1 (H) 4.0 - 10.5 K/uL   RBC 3.40 (L) 4.22 - 5.81 MIL/uL   Hemoglobin 10.0 (L) 13.0 - 17.0 g/dL   HCT 54.0 (L) 98.1 - 19.1 %   MCV 92.4 80.0 - 100.0 fL   MCH 29.4 26.0 - 34.0 pg   MCHC 31.8 30.0 - 36.0 g/dL   RDW 47.8 29.5 - 62.1 %   Platelets 272 150 - 400 K/uL   nRBC 0.0 0.0 - 0.2 %   Neutrophils Relative % 88 %   Neutro Abs  10.6 (H) 1.7 - 7.7 K/uL   Lymphocytes Relative 5 %   Lymphs Abs 0.6 (L) 0.7 - 4.0 K/uL   Monocytes Relative 6 %   Monocytes Absolute 0.7 0.1 - 1.0 K/uL   Eosinophils Relative 1 %   Eosinophils Absolute 0.1 0.0 - 0.5  K/uL   Basophils Relative 0 %   Basophils Absolute 0.1 0.0 - 0.1 K/uL   Immature Granulocytes 0 %   Abs Immature Granulocytes 0.05 0.00 - 0.07 K/uL    Comment: Performed at [facility], [address], [place], [place] [address]  Protime-INR     Status: None   Collection Time: 05/27/22  6:30 AM  Result Value Ref Range   Prothrombin Time 13.2 11.4 - 15.2 seconds   INR 1.0 0.8 - 1.2    Comment: (NOTE) INR goal varies based on device and disease states. Performed at [facility], [address] [place], [place] [address]   APTT     Status: None   Collection Time: 05/27/22  6:30 AM  Result Value Ref Range   aPTT 30 24 - 36 seconds    Comment: Performed at [facility], [address], [place], [place] [address]  Lactic acid, plasma     Status: None   Collection Time: 05/27/22  6:31 AM  Result Value Ref Range   Lactic Acid, Venous 0.9 0.5 - 1.9 mmol/L    Comment: Performed at [facility], [address], [place], [place] [address]  I-Stat venous blood gas, ED ([facility])     Status: Abnormal   Collection Time: 05/27/22  6:35 AM  Result Value Ref Range   pH, Ven 7.494 (H) 7.25 - 7.43   pCO2, Ven 29.0 (L) 44 - 60 mmHg   pO2, Ven 58 (H) 32 - 45 mmHg   Bicarbonate 22.3 20.0 - 28.0 mmol/L   TCO2 23 22 - 32 mmol/L   O2 Saturation 92 %   Acid-Base Excess 0.0 0.0 - 2.0 mmol/L   Sodium 138 135 - 145 mmol/L   Potassium 3.9 3.5 - 5.1 mmol/L   Calcium, Ion 1.05 (L) 1.15 - 1.40 mmol/L   HCT 32.0 (L) 39.0 - 52.0 %   Hemoglobin 10.9 (L) 13.0 - 17.0 g/dL   Sample type VENOUS   I-Stat Chem 8, ED     Status: Abnormal   Collection Time: 05/27/22  6:36 AM  Result Value Ref Range   Sodium 138 135 - 145 mmol/L   Potassium 4.0 3.5 - 5.1 mmol/L   Chloride  104 98 - 111 mmol/L   BUN 33 (H) 8 - 23 mg/dL   Creatinine, Ser 2.95 (H) 0.61 - 1.24 mg/dL   Glucose, Bld 621 (H) 70 - 99 mg/dL    Comment: Glucose reference range applies only to samples taken after fasting for at least 8 hours.   Calcium, Ion 1.04 (L) 1.15 - 1.40 mmol/L   TCO2 22 22 - 32 mmol/L   Hemoglobin 10.9 (L) 13.0 - 17.0 g/dL   HCT 30.8 (L) 65.7 - 84.6 %  D-dimer, quantitative     Status: Abnormal   Collection Time: 05/27/22  8:11 AM  Result Value Ref Range   D-Dimer, Quant 1.94 (H) 0.00 - 0.50 ug/mL-FEU    Comment: (NOTE) At the manufacturer cut-off value of 0.5 g/mL FEU, this assay has a negative predictive value of 95-100%.This assay is intended for use in conjunction with a clinical pretest probability (PTP) assessment model to exclude pulmonary embolism (PE) and deep venous thrombosis (DVT) in outpatients suspected of PE or DVT. Results should be correlated with clinical presentation. Performed at [facility], [address], [place], [place] [address]   Urinalysis, w/ Reflex to Culture (Infection Suspected) -Urine, Clean Catch  Status: Abnormal   Collection Time: 05/27/22  1:10 PM  Result Value Ref Range   Specimen Source URINE, CLEAN CATCH    Color, Urine YELLOW YELLOW   APPearance CLEAR CLEAR   Specific Gravity, Urine 1.012 1.005 - 1.030   pH 7.0 5.0 - 8.0   Glucose, UA NEGATIVE NEGATIVE mg/dL   Hgb urine dipstick SMALL (A) NEGATIVE   Bilirubin Urine NEGATIVE NEGATIVE   Ketones, ur 5 (A) NEGATIVE mg/dL   Protein, ur >=161 (A) NEGATIVE mg/dL   Nitrite NEGATIVE NEGATIVE   Leukocytes,Ua NEGATIVE NEGATIVE   RBC / HPF 0-5 0 - 5 RBC/hpf   WBC, UA 0-5 0 - 5 WBC/hpf    Comment:        Reflex urine culture not performed if WBC <=10, OR if Squamous epithelial cells >5. If Squamous epithelial cells >5 suggest recollection.    Bacteria, UA NONE SEEN NONE SEEN   Squamous Epithelial / HPF 0-5 0 - 5 /HPF   Mucus PRESENT     Comment: Performed at [facility], [address] [place], [place] [address]   CT Angio Chest PE W/Cm &/Or Wo Cm  Result Date: 05/27/2022 CLINICAL DATA:  62 year old male with clinical history concerning for potential pulmonary embolism. EXAM: CT ANGIOGRAPHY CHEST WITH CONTRAST TECHNIQUE: Multidetector CT imaging of the chest was performed using the standard protocol during bolus administration of intravenous contrast. Multiplanar CT image reconstructions and MIPs were obtained to evaluate the vascular anatomy. RADIATION DOSE REDUCTION: This exam was performed according to the departmental dose-optimization program which includes automated exposure control, adjustment of the mA and/or kV according to patient size and/or use of iterative reconstruction technique. CONTRAST:  75mL OMNIPAQUE IOHEXOL 350 MG/ML SOLN COMPARISON:  No priors. FINDINGS: Cardiovascular: No filling defects within the pulmonary arterial tree to suggest pulmonary embolism. Heart size is borderline enlarged. Concentric left ventricular hypertrophy. There is no significant pericardial fluid, thickening or pericardial calcification. There is aortic atherosclerosis, as well as atherosclerosis of the great vessels of the mediastinum and the coronary arteries, including calcified atherosclerotic plaque in the left anterior descending, left circumflex and right coronary arteries. Right internal jugular PermCath with tip terminating in the right atrium. Mediastinum/Nodes: No pathologically enlarged mediastinal or hilar lymph nodes. Small hiatal hernia. No axillary lymphadenopathy. Lungs/Pleura: Widespread areas of ground-glass attenuation with some patchy peribronchovascular airspace consolidation and septal thickening noted in the lungs bilaterally. Small to moderate bilateral pleural effusions. No definite suspicious appearing pulmonary nodules or masses are noted. Upper Abdomen: Unremarkable. Musculoskeletal: There are no aggressive appearing lytic or blastic lesions  noted in the visualized portions of the skeleton. Review of the MIP images confirms the above findings. IMPRESSION: 1. No evidence of pulmonary embolism. 2. There is diffuse ground-glass attenuation, peribronchovascular airspace consolidation and interlobular septal thickening. Overall, the appearance of the lungs is favored to reflect pulmonary edema. Small to moderate bilateral pleural effusions are also noted. Given the borderline cardiac enlargement with concentric left ventricular hypertrophy clinical correlation for signs and symptoms of potential diastolic heart failure is suggested. Alternatively, the appearance of the lungs could suggest developing multilobar bilateral bronchopneumonia. 3. Aortic atherosclerosis, in addition to three-vessel coronary artery disease. Please note that although the presence of coronary artery calcium documents the presence of coronary artery disease, the severity of this disease and any potential stenosis cannot be assessed on this non-gated CT examination. Assessment for potential risk factor modification, dietary therapy or pharmacologic therapy may be warranted, if clinically indicated. Aortic  Atherosclerosis (ICD10-I70.0). Electronically Signed   By: Trudie Reed M.D.   On: 05/27/2022 10:50   DG Chest Port 1 View  Result Date: 05/27/2022 CLINICAL DATA:  Questionable sepsis EXAM: PORTABLE CHEST 1 VIEW COMPARISON:  05/15/2022 FINDINGS: Perma catheter with tip at the upper cavoatrial junction. Borderline heart size. There is no edema, consolidation, effusion, or pneumothorax. IMPRESSION: Negative for pneumonia. Electronically Signed   By: Tiburcio Pea M.D.   On: 05/27/2022 06:38    Pending Labs Unresulted Labs (From admission, onward)     Start     Ordered   05/28/22 0500  Comprehensive metabolic panel  Tomorrow morning,   R        05/27/22 1302   05/28/22 0500  CBC  Tomorrow morning,   R        05/27/22 1302   05/27/22 1316  Procalcitonin  Once,   R        References:    Procalcitonin Lower Respiratory Tract Infection AND Sepsis Procalcitonin Algorithm   05/27/22 1315   05/27/22 1312  Magnesium  Add-on,   AD        05/27/22 1311   05/27/22 1310  Brain natriuretic peptide  Add-on,   AD        05/27/22 1309   05/27/22 0601  Blood Culture (routine x 2)  (Undifferentiated presentation (screening labs and basic nursing orders))  BLOOD CULTURE X 2,   STAT      05/27/22 0604            Vitals/Pain Today's Vitals   05/27/22 1130 05/27/22 1200 05/27/22 1230 05/27/22 1311  BP: (!) 147/69 (!) 155/72 (!) 162/81   Pulse: 84 82 89   Resp: 12 19 11    Temp:    97.8 F (36.6 C)  TempSrc:    Oral  SpO2: 100% 100% 96%   Weight:      Height:        Isolation Precautions No active isolations  Medications Medications  Chlorhexidine Gluconate Cloth 2 % PADS 6 each (0 each Topical Hold 05/27/22 1021)  amLODipine (NORVASC) tablet 10 mg (has no administration in time range)  hydrALAZINE (APRESOLINE) tablet 100 mg (has no administration in time range)  metoprolol succinate (TOPROL-XL) 24 hr tablet 12.5 mg (has no administration in time range)  rosuvastatin (CRESTOR) tablet 10 mg (has no administration in time range)  finasteride (PROSCAR) tablet 5 mg (has no administration in time range)  tamsulosin (FLOMAX) capsule 0.4 mg (has no administration in time range)  famotidine (PEPCID) tablet 10 mg (has no administration in time range)  heparin injection 5,000 Units (has no administration in time range)  sodium chloride flush (NS) 0.9 % injection 3 mL (has no administration in time range)  acetaminophen (TYLENOL) tablet 650 mg (has no administration in time range)    Or  acetaminophen (TYLENOL) suppository 650 mg (has no administration in time range)  polyethylene glycol (MIRALAX / GLYCOLAX) packet 17 g (has no administration in time range)  gabapentin (NEURONTIN) capsule 300 mg (has no administration in time range)  ondansetron (ZOFRAN) injection 4  mg (4 mg Intravenous Given 05/27/22 0622)  acetaminophen (TYLENOL) tablet 650 mg (650 mg Oral Given 05/27/22 0621)  ceFEPIme (MAXIPIME) 2 g in sodium chloride 0.9 % 100 mL IVPB (0 g Intravenous Stopped 05/27/22 0845)  metroNIDAZOLE (FLAGYL) IVPB 500 mg (0 mg Intravenous Stopped 05/27/22 1215)  vancomycin (VANCOREADY) IVPB 2000 mg/400 mL (0 mg Intravenous Stopped 05/27/22 1108)  iohexol (OMNIPAQUE) 350  MG/ML injection 75 mL (75 mLs Intravenous Contrast Given 05/27/22 1037)    Mobility walks     Focused Assessments Renal Assessment Handoff:  Hemodialysis Schedule:  Last Hemodialysis date and time:    Restricted appendage: right arm   R Recommendations: See Admitting Provider Note  Report given to:   Additional Notes:

## 2022-05-27 NOTE — ED Provider Notes (Signed)
Patient signed out to me at 0700 by Dr. Eudelia Bunch pending D-dimer with plan for admission.  In short this is a 62 year old male with a past medical history of CKD recently started on dialysis during hospital admission about 2 weeks ago for urinary retention and ESBL UTI, diabetes, hypertension presented to the emergency department today with nausea and vomiting.  The patient was initially hypoxic on arrival and placed on 2 L nasal cannula and is mildly tachycardic.  EKG showed normal sinus rhythm on arrival.  Patient had labs and chest x-ray performed.  X-ray showed no obvious disease to explain his hypoxia or symptoms.  Patient's labs show no significant change from his baseline with a mildly elevated anion gap that may be secondary to his uremia, CKD versus mild starvation ketosis in the setting of vomiting.  Patient did have a leukocytosis and blood cultures were sent and was started on broad-spectrum antibiotics.  Patient's lactic was normal.  D-dimer is pending at this time for possible PE as a cause of his tachycardia and hypoxia he will require admission.  Clinical Course as of 05/27/22 1234  Sun May 27, 2022  1610 Chest x-ray without evidence for pneumonia, pneumothorax, pulmonary edema or pleural effusions. [PC]  9604 I-STAT Chem-8 without significant electrolyte derangements.  Baseline renal function.  Baseline hemoglobin. [PC]  0700 CBC with leukocytosis. Stable Hb. [PC]  0708 Started on empiric Abx [PC]  5409 Consulted with nephrology given possible need for CT imaging or dialysis.  He recommended obtaining a D-dimer.  If positive obtaining a CTA to rule out a PE.  Dr. Burgess Estelle evaluated the patient in the emergency department.  He will give him a dose of Lasix.  No plan for dialysis today. [PC]  [id number] D-dimer is elevated, he will have CTPE study performed.  [VK]  1233 CTPE negative for PE, does show evidence of pulmonary edema vs multifocal pneumonia. He has received broad spectrum antibiotics and  lasix and is making urine. He will be admitted for further management. [VK]    Clinical Course User Index [PC] Cardama, Amadeo Garnet, MD [VK] Rexford Maus, DO      Rexford Maus, [place] 05/27/22 1234

## 2022-05-27 NOTE — Progress Notes (Addendum)
Nephrology Follow-Up Consult note   Assessment/Recommendations: Ryan Jenkins is a/an 62 y.o. male with a past medical history significant for CKD 5, DM2, HTN, recurrent MDR UTI, admitted for AKI/urinary retention, lower extremity edema.       ESRD: AKI during recent admission and discharged to start dialysis outpatient at [facility].  Now back with several symptoms. -Plan for dialysis tomorrow per planned outpatient schedule of MWF -Status post right upper extremity AVF with Dr. Karin Lieu on 5/9. Appreciate help -Continue to monitor daily Cr, Dose meds for GFR -Monitor Daily I/Os, Daily weight  -Maintain MAP>65 for optimal renal perfusion.  -Avoid nephrotoxic medications including NSAIDs -Use synthetic opioids (Fentanyl/Dilaudid) if needed  Chest pain: CTA per primary team.  Hypoxia: Mild.  Possibly related to aspiration given his nausea and vomiting.  Concern for infection.  Recent significant UTI only sensitive to meropenem.  Blood cultures and further workup per primary team  Hypertension: Can continue home medications.  Anemia due to CKD/chronic inflammation; hemoglobin at goal.   Hyperphosphatemia: Continue home sevelamer  ADDENDUM: CTA negative for PE. Concerning for pulmonary edema. Will give lasix 160mg  x2 doses + metolazone 5mg  and plan for HD first session tomorrow morning   Recommendations conveyed to primary service.    Darnell Level [facility] 05/27/2022 9:52 AM  ___________________________________________________________  CC: AKI  Interval History/Subjective: Patient was discharged on 5/10.  Return to the emergency department today because of fatigue, nausea, vomiting.  He has had some chest pain that radiates to his shoulder.  No significant shortness of breath or fevers that he has noticed.  He required 2 L nasal cannula in the emergency department.  Chest x-ray was without major concern.  There was concern for possible infection with  leukocytosis and blood cultures were sent.  Also with tachycardia possibly concerning for PE that is being evaluated.   Medications:  Current Facility-Administered Medications  Medication Dose Route Frequency Provider Last Rate Last Admin   metroNIDAZOLE (FLAGYL) IVPB 500 mg  500 mg Intravenous Once Cardama, Amadeo Garnet, MD       vancomycin (VANCOREADY) IVPB 2000 mg/400 mL  2,000 mg Intravenous Once Juliette Mangle, RPH 200 mL/hr at 05/27/22 0844 2,000 mg at 05/27/22 0844   Current Outpatient Medications  Medication Sig Dispense Refill   amLODipine (NORVASC) 10 MG tablet Take 1 tablet (10 mg total) by mouth daily. 30 tablet 6   Cholecalciferol (VITAMIN D3) 1.25 MG (50000 UT) CAPS Take 50,000 Units by mouth every Friday.     clopidogrel (PLAVIX) 75 MG tablet Take 75 mg by mouth daily.     famotidine (PEPCID) 10 MG tablet Take 1 tablet (10 mg total) by mouth every Monday, Wednesday, and Friday with hemodialysis. Take after hemodialysis. 12 tablet 0   finasteride (PROSCAR) 5 MG tablet Take 1 tablet (5 mg total) by mouth daily. 30 tablet 2   gabapentin (NEURONTIN) 300 MG capsule Take 1 capsule (300 mg total) by mouth daily.     hydrALAZINE (APRESOLINE) 100 MG tablet Take 100 mg by mouth 3 (three) times daily.     LANTUS SOLOSTAR 100 UNIT/ML Solostar Pen Inject 5 Units into the skin at bedtime.     metoCLOPramide (REGLAN) 5 MG tablet Take 1 tablet (5 mg total) by mouth 4 (four) times daily -  before meals and at bedtime for 14 days. After 2 weeks, follow up with your PCP for further recommendations on how to take.  If your nausea improves prior to completing the  2 week course, you can use before meals and at bedtime only as needed. 56 tablet 0   metoprolol succinate (TOPROL-XL) 25 MG 24 hr tablet Take 0.5 tablets (12.5 mg total) by mouth daily. 15 tablet 2   oxyCODONE (OXY IR/ROXICODONE) 5 MG immediate release tablet Take 5 mg by mouth every 8 (eight) hours as needed (for pain).     polyethylene  glycol (MIRALAX / GLYCOLAX) 17 g packet Take 17 g by mouth daily. 14 each 0   rosuvastatin (CRESTOR) 10 MG tablet Take 10 mg by mouth daily.     senna-docusate (SENOKOT-S) 8.6-50 MG tablet Take 1 tablet by mouth at bedtime. (Patient not taking: Reported on 05/15/2022) 30 tablet 2   sodium bicarbonate 650 MG tablet Take 1,300 mg by mouth 2 (two) times daily.     tamsulosin (FLOMAX) 0.4 MG CAPS capsule Take 0.4 mg by mouth at bedtime.     triamcinolone ointment (KENALOG) 0.5 % Apply 1 Application topically 2 (two) times daily as needed (for itching).     TYLENOL 500 MG tablet Take 500-1,000 mg by mouth every 6 (six) hours as needed for mild pain or headache.        Review of Systems: 10 systems reviewed and negative except per interval history/subjective  Physical Exam: Vitals:   05/27/22 0830 05/27/22 0900  BP: (!) 149/77 (!) 141/73  Pulse: 83 78  Resp: (!) 4 15  Temp:    SpO2: 100% 98%   Total I/O In: 100.3 [IV Piggyback:100.3] Out: -   Intake/Output Summary (Last 24 hours) at 05/27/2022 2956 Last data filed at 05/27/2022 0845 Gross per 24 hour  Intake 100.3 ml  Output --  Net 100.3 ml   Constitutional: well-appearing, no acute distress ENMT: ears and nose without scars or lesions, MMM CV: normal rate, no edema Respiratory: Bilateral chest rise, normal work of breathing Gastrointestinal: soft, non-tender, no palpable masses or hernias Skin: no visible lesions or rashes Psych: alert, judgement/insight appropriate, appropriate mood and affect   Test Results I personally reviewed new and old clinical labs and radiology tests Lab Results  Component Value Date   NA 138 05/27/2022   K 4.0 05/27/2022   CL 104 05/27/2022   CO2 20 (L) 05/27/2022   BUN 33 (H) 05/27/2022   CREATININE 5.10 (H) 05/27/2022   CALCIUM 8.6 (L) 05/27/2022   ALBUMIN 3.2 (L) 05/27/2022   PHOS 4.5 05/25/2022    CBC Recent Labs  Lab 05/24/22 0445 05/25/22 1155 05/27/22 0630 05/27/22 0635  05/27/22 0636  WBC 5.7 6.6 12.1*  --   --   NEUTROABS  --   --  10.6*  --   --   HGB 9.5* 9.6* 10.0* 10.9* 10.9*  HCT 27.4* 30.1* 31.4* 32.0* 32.0*  MCV 87.3 92.6 92.4  --   --   PLT 269 273 272  --   --

## 2022-05-27 NOTE — H&P (Addendum)
History and Physical   TRESTEN ELWOOD ZOX:[id number] DOB: 1960-05-06 DOA: 05/27/2022  PCP: Malka So., MD   Patient coming from: Home  Chief Complaint: Fatigue, nausea, vomiting  HPI: Ryan Jenkins is a 62 y.o. male with medical history significant of diabetes, hyperlipidemia, hypertension, GERD, BPH, anemia, depression, gastroparesis, CAD, CKD 5, diastolic CHF presenting with fatigue, nausea, vomiting.  Patient recently admitted from 4/30 until 5/10.  Admit for AKI and progression of CKD to ESRD and started on dialysis.  Tunnel dialysis catheter was placed and AV fistula created.  Also had UTI which grew out ESBL Klebsiella pneumonia sensitive to meropenem.  Reportedly since discharge 2 days ago he has had some degree of fatigue.  He started to have nausea and vomiting yesterday evening.  Also reporting some right shoulder pain.  Denies fevers, chills, chest pain, shortness of breath, abdominal pain, constipation, diarrhea.  ED Course: Vital signs in the ED notable for blood pressure in the 140s to 170 systolic, heart rate in the 80s to 100s, hypoxic to 86% on room air requiring 2 L to maintain saturations.  Lab workup included CMP with bicarb 20, gap 16, BUN 34, creatinine stable at 4.41, glucose 154, calcium 8.6, albumin 3.2.  CBC with leukocytosis to 12.1, hemoglobin stable at 10.  PT, PTT, INR normal.  Lactic acid normal.  D-dimer mildly elevated 1.94.  Urinalysis and blood culture pending.  Chest x-ray showed no acute normality did note catheter tip at the caval atrial junction.  CTA PE study was negative for PE but did show diffuse groundglass attenuation with peribronchovascular consolidation and septal thickening as well as small to moderate bilateral effusions representing pulmonary edema versus developing bronchopneumonia.  Patient received vancomycin, cefepime, Flagyl in the ED as well as Tylenol and Zofran.  Nephrology seen the patient and plan for dialysis today.  Review  of Systems: As per HPI otherwise all other systems reviewed and are negative.  Past Medical History:  Diagnosis Date   Anemia    Anginal pain (HCC)    Anxiety    Arthritis    BPH with obstruction/lower urinary tract symptoms    CAD (coronary artery disease)    cardiologist--- dr Lucienne Minks badal;  11-06-2019 cardiac cath in [place] (result in care everywhere)  nonobstructive cad involing pRCA 60% (done in setting worseing CHF/ acute pulmonary edema requiring intubation/ AKI   Chronic combined systolic and diastolic CHF (congestive heart failure) (HCC)    Chronic kidney disease, stage IV (severe) (HCC)    nephrologist--- dr Ronalee Belts   Depression    Diabetic neuropathy (HCC)    Dyspnea    Edema of both lower extremities    Gastric ulcer without hemorrhage or perforation 12/25/2018   GERD (gastroesophageal reflux disease)    Heart murmur    History of community acquired pneumonia    admission 06-04-2021 [facility]  w/ ARF hypoxia w/ severe sepsis   Hyperlipidemia    Hypertension    Insulin dependent type 2 diabetes mellitus (HCC)    LBBB (left bundle branch block)    Pneumonia    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)    Uses walker    Vitamin B12 deficiency    Vitreous hemorrhage (HCC)     Past Surgical History:  Procedure Laterality Date   AV FISTULA PLACEMENT Right 05/24/2022   Procedure: RADIOCEPHALIC ARTERIOVENOUS (AV) FISTULA CREATION;  Surgeon: Victorino Sparrow, MD;  Location: [facility] OR;  Service: Vascular;  Laterality: Right;   CARDIAC CATHETERIZATION  11/06/2019   [facility] in [place];    nonobstructive CAD , pRCA 60% (result in care everywhere)   CATARACT EXTRACTION W/ INTRAOCULAR LENS IMPLANT Bilateral 2017   IR FLUORO GUIDE CV LINE RIGHT  05/23/2022   IR US GUIDE VASC ACCESS RIGHT  05/23/2022   TRANSURETHRAL RESECTION OF PROSTATE N/A 10/16/2021   Procedure: TRANSURETHRAL RESECTION OF THE PROSTATE (TURP);  Surgeon: Jannifer Hick, MD;  Location: [facility] ORS;   Service: Urology;  Laterality: N/A;    Social History  reports that he has never smoked. He has never been exposed to tobacco smoke. He has never used smokeless tobacco. He reports that he does not drink alcohol and does not use drugs.  Allergies  Allergen Reactions   Claritin [Loratadine] Swelling and Other (See Comments)    Joint swelling    Hydrochlorothiazide Other (See Comments)    Dizziness   Latex Hives and Swelling   Lyrica [Pregabalin] Other (See Comments)    Depression. "Makes me loopy"    Metformin And Related Diarrhea and Nausea And Vomiting    Family History  Problem Relation Age of Onset   Renal cancer Mother    Hypertension Mother    Pancreatic cancer Mother    Hypertension Sister    Stroke Sister    Leukemia Maternal Uncle    Sickle cell trait Maternal Aunt    Colon cancer Neg Hx    Esophageal cancer Neg Hx    Rectal cancer Neg Hx    Stomach cancer Neg Hx   Reviewed on admission  Prior to Admission medications   Medication Sig Start Date End Date Taking? Authorizing Provider  amLODipine (NORVASC) 10 MG tablet Take 1 tablet (10 mg total) by mouth daily. 02/08/21   Danford, Earl Lites, MD  Cholecalciferol (VITAMIN D3) 1.25 MG (50000 UT) CAPS Take 50,000 Units by mouth every Friday. 09/12/21   [provider]  clopidogrel (PLAVIX) 75 MG tablet Take 75 mg by mouth daily. 05/10/22   [provider]  famotidine (PEPCID) 10 MG tablet Take 1 tablet (10 mg total) by mouth every Monday, Wednesday, and Friday with hemodialysis. Take after hemodialysis. 05/25/22 06/24/22  Narda Bonds, MD  finasteride (PROSCAR) 5 MG tablet Take 1 tablet (5 mg total) by mouth daily. 07/20/20   Hoy Register, MD  gabapentin (NEURONTIN) 300 MG capsule Take 1 capsule (300 mg total) by mouth daily. 05/25/22   Narda Bonds, MD  hydrALAZINE (APRESOLINE) 100 MG tablet Take 100 mg by mouth 3 (three) times daily. 04/25/22   [provider]  LANTUS SOLOSTAR 100 UNIT/ML  Solostar Pen Inject 5 Units into the skin at bedtime. 03/29/22   Lorin Glass, MD  metoCLOPramide (REGLAN) 5 MG tablet Take 1 tablet (5 mg total) by mouth 4 (four) times daily -  before meals and at bedtime for 14 days. After 2 weeks, follow up with your PCP for further recommendations on how to take.  If your nausea improves prior to completing the 2 week course, you can use before meals and at bedtime only as needed. 04/09/22 05/15/22  Zigmund Daniel., MD  metoprolol succinate (TOPROL-XL) 25 MG 24 hr tablet Take 0.5 tablets (12.5 mg total) by mouth daily. 03/30/22 06/28/22  Lorin Glass, MD  oxyCODONE (OXY IR/ROXICODONE) 5 MG immediate release tablet Take 5 mg by mouth every 8 (eight) hours as needed (for pain).    [provider]  polyethylene glycol (MIRALAX / GLYCOLAX) 17 g packet Take 17  g by mouth daily. 03/30/22   Lorin Glass, MD  rosuvastatin (CRESTOR) 10 MG tablet Take 10 mg by mouth daily.    [provider]  senna-docusate (SENOKOT-S) 8.6-50 MG tablet Take 1 tablet by mouth at bedtime. Patient not taking: Reported on 05/15/2022 03/29/22 06/27/22  Lorin Glass, MD  sodium bicarbonate 650 MG tablet Take 1,300 mg by mouth 2 (two) times daily. 09/20/21   [provider]  tamsulosin (FLOMAX) 0.4 MG CAPS capsule Take 0.4 mg by mouth at bedtime. 05/25/21   [provider]  triamcinolone ointment (KENALOG) 0.5 % Apply 1 Application topically 2 (two) times daily as needed (for itching). 03/27/22   [provider]  TYLENOL 500 MG tablet Take 500-1,000 mg by mouth every 6 (six) hours as needed for mild pain or headache.    [provider]    Physical Exam: Vitals:   05/27/22 1123 05/27/22 1130 05/27/22 1200 05/27/22 1230  BP:  (!) 147/69 (!) 155/72 (!) 162/81  Pulse:  84 82 89  Resp:  12 19 11   Temp:      TempSrc: Oral     SpO2:  100% 100% 96%  Weight:      Height:        Physical Exam Constitutional:      General: He is not in acute  distress.    Appearance: Normal appearance.  HENT:     Head: Normocephalic and atraumatic.     Mouth/Throat:     Mouth: Mucous membranes are moist.     Pharynx: Oropharynx is clear.  Eyes:     Extraocular Movements: Extraocular movements intact.     Pupils: Pupils are equal, round, and reactive to light.  Cardiovascular:     Rate and Rhythm: Normal rate and regular rhythm.     Pulses: Normal pulses.     Heart sounds: Normal heart sounds.  Pulmonary:     Effort: Pulmonary effort is normal. No respiratory distress.     Breath sounds: Normal breath sounds.  Abdominal:     General: Bowel sounds are normal. There is no distension.     Palpations: Abdomen is soft.     Tenderness: There is no abdominal tenderness.  Musculoskeletal:        General: No swelling or deformity.  Skin:    General: Skin is warm and dry.     Comments: AV fistula site and dialysis catheter site without stigmata of infection  Neurological:     General: No focal deficit present.     Mental Status: Mental status is at baseline.    Labs on Admission: I have personally reviewed following labs and imaging studies  CBC: Recent Labs  Lab 05/24/22 0445 05/25/22 1155 05/27/22 0630 05/27/22 0635 05/27/22 0636  WBC 5.7 6.6 12.1*  --   --   NEUTROABS  --   --  10.6*  --   --   HGB 9.5* 9.6* 10.0* 10.9* 10.9*  HCT 27.4* 30.1* 31.4* 32.0* 32.0*  MCV 87.3 92.6 92.4  --   --   PLT 269 273 272  --   --     Basic Metabolic Panel: Recent Labs  Lab 05/21/22 0157 05/22/22 0353 05/23/22 0208 05/24/22 0445 05/25/22 1155 05/27/22 0630 05/27/22 0635 05/27/22 0636  NA 132* 133* 134* 136 136 136 138 138  K 4.9 4.9 4.9 4.1 4.2 3.8 3.9 4.0  CL 103 102 103 103 100 100  --  104  CO2 17* 18* 20* 21* 26  20*  --   --   GLUCOSE 176* 136* 140* 117* 118* 154*  --  153*  BUN 114* 121* 120* 84* 51* 34*  --  33*  CREATININE 8.23* 8.34* 8.23* 6.49* 5.05* 4.41*  --  5.10*  CALCIUM 7.9* 8.1* 8.0* 8.2* 8.2* 8.6*  --   --    PHOS 8.5* 8.8* 8.3*  --  4.5  --   --   --     GFR: Estimated Creatinine Clearance: 19.9 mL/min (A) (by C-G formula based on SCr of 5.1 mg/dL (H)).  Liver Function Tests: Recent Labs  Lab 05/21/22 0157 05/22/22 0353 05/23/22 0208 05/25/22 1155 05/27/22 0630  AST  --   --   --   --  20  ALT  --   --   --   --  7  ALKPHOS  --   --   --   --  58  BILITOT  --   --   --   --  0.9  PROT  --   --   --   --  7.3  ALBUMIN 2.7* 2.4* 2.4* 2.7* 3.2*    Urine analysis:    Component Value Date/Time   COLORURINE YELLOW 05/22/2022 1700   APPEARANCEUR HAZY (A) 05/22/2022 1700   LABSPEC 1.011 05/22/2022 1700   PHURINE 5.0 05/22/2022 1700   GLUCOSEU NEGATIVE 05/22/2022 1700   HGBUR SMALL (A) 05/22/2022 1700   BILIRUBINUR NEGATIVE 05/22/2022 1700   KETONESUR NEGATIVE 05/22/2022 1700   PROTEINUR 100 (A) 05/22/2022 1700   NITRITE NEGATIVE 05/22/2022 1700   LEUKOCYTESUR LARGE (A) 05/22/2022 1700    Radiological Exams on Admission: CT Angio Chest PE W/Cm &/Or Wo Cm  Result Date: 05/27/2022 CLINICAL DATA:  62 year old male with clinical history concerning for potential pulmonary embolism. EXAM: CT ANGIOGRAPHY CHEST WITH CONTRAST TECHNIQUE: Multidetector CT imaging of the chest was performed using the standard protocol during bolus administration of intravenous contrast. Multiplanar CT image reconstructions and MIPs were obtained to evaluate the vascular anatomy. RADIATION DOSE REDUCTION: This exam was performed according to the departmental dose-optimization program which includes automated exposure control, adjustment of the mA and/or kV according to patient size and/or use of iterative reconstruction technique. CONTRAST:  75mL OMNIPAQUE IOHEXOL 350 MG/ML SOLN COMPARISON:  No priors. FINDINGS: Cardiovascular: No filling defects within the pulmonary arterial tree to suggest pulmonary embolism. Heart size is borderline enlarged. Concentric left ventricular hypertrophy. There is no significant  pericardial fluid, thickening or pericardial calcification. There is aortic atherosclerosis, as well as atherosclerosis of the great vessels of the mediastinum and the coronary arteries, including calcified atherosclerotic plaque in the left anterior descending, left circumflex and right coronary arteries. Right internal jugular PermCath with tip terminating in the right atrium. Mediastinum/Nodes: No pathologically enlarged mediastinal or hilar lymph nodes. Small hiatal hernia. No axillary lymphadenopathy. Lungs/Pleura: Widespread areas of ground-glass attenuation with some patchy peribronchovascular airspace consolidation and septal thickening noted in the lungs bilaterally. Small to moderate bilateral pleural effusions. No definite suspicious appearing pulmonary nodules or masses are noted. Upper Abdomen: Unremarkable. Musculoskeletal: There are no aggressive appearing lytic or blastic lesions noted in the visualized portions of the skeleton. Review of the MIP images confirms the above findings. IMPRESSION: 1. No evidence of pulmonary embolism. 2. There is diffuse ground-glass attenuation, peribronchovascular airspace consolidation and interlobular septal thickening. Overall, the appearance of the lungs is favored to reflect pulmonary edema. Small to moderate bilateral pleural effusions are also noted. Given the borderline cardiac enlargement with  concentric left ventricular hypertrophy clinical correlation for signs and symptoms of potential diastolic heart failure is suggested. Alternatively, the appearance of the lungs could suggest developing multilobar bilateral bronchopneumonia. 3. Aortic atherosclerosis, in addition to three-vessel coronary artery disease. Please note that although the presence of coronary artery calcium documents the presence of coronary artery disease, the severity of this disease and any potential stenosis cannot be assessed on this non-gated CT examination. Assessment for potential risk  factor modification, dietary therapy or pharmacologic therapy may be warranted, if clinically indicated. Aortic Atherosclerosis (ICD10-I70.0). Electronically Signed   By: Trudie Reed M.D.   On: 05/27/2022 10:50   DG Chest Port 1 View  Result Date: 05/27/2022 CLINICAL DATA:  Questionable sepsis EXAM: PORTABLE CHEST 1 VIEW COMPARISON:  05/15/2022 FINDINGS: Perma catheter with tip at the upper cavoatrial junction. Borderline heart size. There is no edema, consolidation, effusion, or pneumothorax. IMPRESSION: Negative for pneumonia. Electronically Signed   By: Tiburcio Pea M.D.   On: 05/27/2022 06:38    EKG: Independently reviewed.  Sinus tachycardia at 106 bpm.  J-point elevation/repolarization abnormality anterior leads.  Minimal baseline wander.  QTc prolonged at 504.  Assessment/Plan Active Problems:   CKD (chronic kidney disease) stage 5, GFR less than 15 ml/min (HCC)   DM2 (diabetes mellitus, type 2) (HCC)   Essential hypertension   HLD (hyperlipidemia)   Gastroesophageal reflux disease without esophagitis   Anemia in chronic kidney disease (CKD)   Chronic diastolic heart failure (HCC)   Depression   Coronary artery disease   BPH (benign prostatic hyperplasia)   Diabetic gastroparesis (HCC)   Acute respiratory failure with hypoxia (HCC)   Acute respiratory failure with hypoxia Suspect volume overload Acute on chronic diastolic CHF Rule out pneumonia > Last echo was 2023 with EF 50-55%, G2 DD, mildly reduced RV function. > Presented with fatigue, nausea vomiting since discharge from recent admission for CKD progressed to ESRD and ESBL Klebsiella UTI. > Plan to be hypoxic to 86% on room air in the ED requiring 2 L to maintain saturations. > Chest x-ray unrevealing CT PE study showed evidence of pulmonary edema versus early pneumonia. > Does have mild leukocytosis as below at 12.1.  So has been covered with antibiotics, with small bilateral effusions and lower extremity edema  does appear more consistent with volume overload/diastolic CHF exacerbation. > Received antibiotics in the ED as below.  Also nephrology consulted and plan for dialysis today. - Monitor on telemetry - Appreciate nephrology assistance with volume management with HD - Continue supple oxygen, wean as tolerated - Add on BNP - Check procalcitonin - Trend fever curve and WBC - Strict I's and O's, daily weights - Most recent echo was in the last 6 months - Trend renal function and electrolytes - Check magnesium Addendum > Procalcitonin 0.11, lactic acid negative, BNP greater than 1400.  Leukocytosis mild at 12.1. > Current presentation favoring volume overload and acute on chronic CHF with pneumonia being less likely. - Hold off on further antibiotics for now - Continue with primary volume management with HD - Continue with Lasix as ordered by nephrology  Leukocytosis Rule out pneumonia and UTI > Presenting with fatigue, nausea, vomiting.  Found to have hypoxia as above.  Noted to have leukocytosis to 12.1.  Recently admitted for ESBL Klebsiella UTI, sensitive to meropenem. > Imaging in the ED consistent with pulmonary edema versus early pneumonia.  Urinalysis still pending.  Blood cultures pending. > Did receive broad-spectrum antibiotics with vancomycin, cefepime, Flagyl in  the ED. > Suspect CT chest findings are more consistent with pulmonary edema/volume overload than pneumonia as above. > If urinalysis concerning for persistent/recurrent UTI will need to resume meropenem, if urinalysis negative will likely continue with cefepime/vancomycin to cover for HCAP considering recent admission and antibiotic course. Could also be reactive component, will check procalcitonin. - Monitor on telemetry - Trend fever curve and WBC - Continue with vancomycin, cefepime for now - Follow-up urine culture, blood culture - Procalcitonin Addendum > Procalcitonin 0.11, lactic acid negative, BNP greater than  1400.  Leukocytosis mild at 12.1, urinalysis not consistent with UTI.  Also alternative explanation for nausea vomiting with recent diagnosis of gastroparesis. > Current presentation favoring volume overload and acute on chronic CHF and reactive leukocytosis. With pneumonia being less likely and not suspicious for UTI. - Hold off on further antibiotics for now - Volume management as above  ESRD > Progression from CKD 5 to ESRD now on dialysis during recent admission. > Nephrology consulted in the ED and will be taken patient for dialysis today. - Appreciate nephrology recommendations - Continue with HD - Trend renal function and electrolytes - Lasix as above  Nausea vomiting QTc prolongation > QTc mildly elevated at 504.  Has come in with nausea vomiting.  Recent diagnosis of diabetic gastroparesis (there is prior emptying study that did show delayed emptying 2 months ago). > Will hold off on Reglan for now given that elevated QTc. - Repeat EKG in a.m. - Supportive care  Diabetes - SSI  Hypertension - Continue home amlodipine, hydralazine, metoprolol  Hyperlipidemia - Continue home rosuvastatin  BPH - Continue home tamsulosin and finasteride  CAD > Cath in 2021 with nonobstructive CAD. > No longer on plavix and not sure why he was previously. - Continue home metoprolol  GERD - Continue home Pepcid  Anemia > Hemoglobin stable at 10. - Trend CBC  DVT prophylaxis: Heparin Code Status:   Full Family Communication:  Updated at beside  Disposition Plan:   Patient is from:  Home  Anticipated DC to:  Home  Anticipated DC date:  1 to 4 days  Anticipated DC barriers: None  Consults called:  Nephrology Admission status:  Observation, telemetry  Severity of Illness: The appropriate patient status for this patient is OBSERVATION. Observation status is judged to be reasonable and necessary in order to provide the required intensity of service to ensure the patient's safety.  The patient's presenting symptoms, physical exam findings, and initial radiographic and laboratory data in the context of their medical condition is felt to place them at decreased risk for further clinical deterioration. Furthermore, it is anticipated that the patient will be medically stable for discharge from the hospital within 2 midnights of admission.    Synetta Fail MD [facility]  How to contact the [facility] Attending or Consulting provider 7A - 7P or covering provider during after hours 7P -7A, for this patient?   Check the care team in [facility] and look for a) attending/consulting [facility] provider listed and b) the [facility] team listed Log into [email or web address] and use [place]'s universal password to access. If you do not have the password, please contact the hospital operator. Locate the [facility] provider you are looking for under [facility] and page to a number that you can be directly reached. If you still have difficulty reaching the provider, please page the [facility] (Director on Call) for the Hospitalists listed on amion for assistance.  05/27/2022, 1:07 PM

## 2022-05-28 DIAGNOSIS — I2510 Atherosclerotic heart disease of native coronary artery without angina pectoris: Secondary | ICD-10-CM | POA: Diagnosis not present

## 2022-05-28 DIAGNOSIS — E1122 Type 2 diabetes mellitus with diabetic chronic kidney disease: Secondary | ICD-10-CM | POA: Diagnosis not present

## 2022-05-28 DIAGNOSIS — I132 Hypertensive heart and chronic kidney disease with heart failure and with stage 5 chronic kidney disease, or end stage renal disease: Secondary | ICD-10-CM | POA: Diagnosis not present

## 2022-05-28 DIAGNOSIS — J9601 Acute respiratory failure with hypoxia: Secondary | ICD-10-CM | POA: Diagnosis not present

## 2022-05-28 LAB — COMPREHENSIVE METABOLIC PANEL
ALT: 7 U/L (ref 0–44)
AST: 15 U/L (ref 15–41)
Albumin: 2.8 g/dL — ABNORMAL LOW (ref 3.5–5.0)
Alkaline Phosphatase: 45 U/L (ref 38–126)
Anion gap: 11 (ref 5–15)
BUN: 39 mg/dL — ABNORMAL HIGH (ref 8–23)
CO2: 23 mmol/L (ref 22–32)
Calcium: 8.4 mg/dL — ABNORMAL LOW (ref 8.9–10.3)
Chloride: 102 mmol/L (ref 98–111)
Creatinine, Ser: 4.8 mg/dL — ABNORMAL HIGH (ref 0.61–1.24)
GFR, Estimated: 13 mL/min — ABNORMAL LOW (ref >60)
Glucose, Bld: 91 mg/dL (ref 70–99)
Potassium: 3.5 mmol/L (ref 3.5–5.1)
Sodium: 136 mmol/L (ref 135–145)
Total Bilirubin: 0.6 mg/dL (ref 0.3–1.2)
Total Protein: 6.6 g/dL (ref 6.5–8.1)

## 2022-05-28 LAB — CBC
HCT: 27 % — ABNORMAL LOW (ref 39.0–52.0)
Hemoglobin: 8.7 g/dL — ABNORMAL LOW (ref 13.0–17.0)
MCH: 29.9 pg (ref 26.0–34.0)
MCHC: 32.2 g/dL (ref 30.0–36.0)
MCV: 92.8 fL (ref 80.0–100.0)
Platelets: 251 10*3/uL (ref 150–400)
RBC: 2.91 MIL/uL — ABNORMAL LOW (ref 4.22–5.81)
RDW: 14.2 % (ref 11.5–15.5)
WBC: 8.6 10*3/uL (ref 4.0–10.5)
nRBC: 0 % (ref 0.0–0.2)

## 2022-05-28 LAB — GLUCOSE, CAPILLARY: Glucose-Capillary: 88 mg/dL (ref 70–99)

## 2022-05-28 LAB — CULTURE, BLOOD (ROUTINE X 2): Culture: NO GROWTH

## 2022-05-28 MED ORDER — ALTEPLASE 2 MG IJ SOLR: 2.0000 mg | Freq: Once | INTRAMUSCULAR | Status: DC | PRN

## 2022-05-28 MED ORDER — HEPARIN SODIUM (PORCINE) 1000 UNIT/ML DIALYSIS: 1000.0000 [IU] | INTRAMUSCULAR | Status: DC | PRN

## 2022-05-28 MED ORDER — LIDOCAINE-PRILOCAINE 2.5-2.5 % EX CREA: 1.0000 | TOPICAL_CREAM | CUTANEOUS | Status: DC | PRN

## 2022-05-28 MED ORDER — LIDOCAINE HCL (PF) 1 % IJ SOLN: 5.0000 mL | INTRAMUSCULAR | Status: DC | PRN

## 2022-05-28 MED ORDER — PENTAFLUOROPROP-TETRAFLUOROETH EX AERO: 1.0000 | INHALATION_SPRAY | CUTANEOUS | Status: DC | PRN

## 2022-05-28 NOTE — Progress Notes (Signed)
Received patient in bed to unit.  Alert and oriented.  Informed consent signed and in chart.   TX duration: 4hrs  Patient tolerated well.  Alert, without acute distress.  Hand-off given to patient's nurse.   Access used: R IJ Access issues: None  Total UF removed: Medication(s) given: None    05/28/22 1242  Vitals  Temp 98.4 F (36.9 C)  Temp Source Oral  BP (!) 157/79  MAP (mmHg) 102  BP Location Left Arm  BP Method Automatic  Patient Position (if appropriate) Lying  Pulse Rate 80  Pulse Rate Source Monitor  ECG Heart Rate 81  Resp 15  Oxygen Therapy  SpO2 100 %  O2 Device Nasal Cannula  O2 Flow Rate (L/min) 2 L/min  During Treatment Monitoring  Intra-Hemodialysis Comments Tx completed;Tolerated well  Post Treatment  Dialyzer Clearance Heavily streaked  Duration of HD Treatment -hour(s) 4 hour(s)  Hemodialysis Intake (mL) 96 mL  Fluid Removed (mL) 4000 mL  Tolerated HD Treatment Yes  Hemodialysis Catheter Right Subclavian Double lumen Permanent (Tunneled)  Placement Date/Time: 05/23/22 1230   Placed prior to admission: Yes  Serial / Lot #: [id number]  Expiration Date: 11/15/26  Time Out: Correct patient;Correct site;Correct procedure  Maximum sterile barrier precautions: Hand hygiene;Cap;Mask;Sterile gow...  Site Condition No complications  Blue Lumen Status Dead end cap in place;Heparin locked  Red Lumen Status Dead end cap in place;Heparin locked  Purple Lumen Status N/A  Catheter fill solution Heparin 1000 units/ml  Catheter fill volume (Arterial) 1.6 cc  Catheter fill volume (Venous) 1.6  Dressing Type Transparent  Dressing Status Clean, Dry, Intact  Interventions Other (Comment)  Drainage Description None  Dressing Change Due 05/30/22  Post treatment catheter status Capped and Clamped     Jerelle Virden G [facility]

## 2022-05-28 NOTE — Discharge Summary (Signed)
Physician Discharge Summary  Ryan Jenkins ZOX:[id number] DOB: Jun 27, 1960 DOA: 05/27/2022  PCP: Malka So., MD  Admit date: 05/27/2022 Discharge date: 05/28/2022 30 Day Unplanned Readmission Risk Score    Flowsheet Row ED to Hosp-Admission (Discharged) from 05/15/2022 in [facility]M KIDNEY UNIT  30 Day Unplanned Readmission Risk Score (%) 54.81 Filed at 05/25/2022 1600       This score is the patient's risk of an unplanned readmission within 30 days of being discharged (0 -100%). The score is based on dignosis, age, lab data, medications, orders, and past utilization.   Low:  0-14.9   Medium: 15-21.9   High: 22-29.9   Extreme: 30 and above          Admitted From: Home Disposition: Home  Recommendations for Outpatient Follow-up:  Follow up with PCP in 1-2 weeks Please obtain BMP/CBC in one week Please follow up with your PCP on the following pending results: Unresulted Labs (From admission, onward)    None         Home Health: Yes Equipment/Devices: None  Discharge Condition: Stable CODE STATUS: Full code Diet recommendation: Renal/low-sodium  Subjective: Seen and examined.  He feels well after dialysis and feels comfortable going home.  No complaints.  Brief/Interim Summary: Ryan Jenkins is a 62 y.o. male with medical history significant of diabetes, hyperlipidemia, hypertension, GERD, BPH, anemia, depression, gastroparesis, CAD, CKD 5, diastolic CHF presented with fatigue, nausea, vomiting.   Patient recently admitted from 4/30 until 5/10.  Admit for AKI and progression of CKD to ESRD and started on dialysis.  Tunnel dialysis catheter was placed and AV fistula created.  Also had UTI which grew out ESBL Klebsiella pneumonia sensitive to meropenem.   Reportedly since discharge 2 days ago he has had some degree of fatigue.  He started to have nausea and vomiting a day prior.  No fever, chills, diarrhea or any other complaint.  Upon arrival to ED,  blood pressure was fine but he was slightly hypoxic at 86% on room air and required 2 L of oxygen.  D-dimer was elevated, CTA PE study was negative for PE but showed diffuse groundglass attenuation with peribronchovascular consolidation and septal thickening as well as small to moderate bilateral effusions representing pulmonary edema versus developing bronchopneumonia.  Patient received vancomycin, cefepime, Flagyl in the ED as well as Tylenol and Zofran.  Patient was initially admitted to hospitalist service for acute hypoxic respiratory failure secondary to volume overload and presumed pneumonia however later on based on the procalcitonin, pneumonia was ruled out and antibiotics were not continued.  He had mild leukocytosis which also resolved today even before dialysis.  He had his dialysis with nephrology today, feels well, has been weaned to room air and is saturating 92 to 94% with exertion.  Typically uses rollator at home.  He now feels much better to the point that he prefers to go home.  Lengthy discussion with him, regarding dietary restriction such as sodium and fluid restriction as well as sodium restriction.  He verbalized understanding.  He is being discharged in stable condition.  Resume home medications.  Discharge plan was discussed with patient and/or family member and they verbalized understanding and agreed with it.  Discharge Diagnoses:  Principal Problem:   Acute respiratory failure with hypoxia (HCC) Active Problems:   CKD (chronic kidney disease) stage 5, GFR less than 15 ml/min (HCC)   DM2 (diabetes mellitus, type 2) (HCC)   Essential hypertension   HLD (hyperlipidemia)  Gastroesophageal reflux disease without esophagitis   Anemia in chronic kidney disease (CKD)   Chronic diastolic heart failure (HCC)   Depression   Coronary artery disease   BPH (benign prostatic hyperplasia)   Diabetic gastroparesis (HCC)    Discharge Instructions   Allergies as of 05/28/2022        Reactions   Claritin [loratadine] Swelling, Other (See Comments)   Joint swelling   Hydrochlorothiazide Other (See Comments)   Dizziness   Latex Hives, Swelling   Lyrica [pregabalin] Other (See Comments)   Depression. "Makes me loopy"    Metformin And Related Diarrhea, Nausea And Vomiting        Medication List     STOP taking these medications    metoCLOPramide 5 MG tablet Commonly known as: REGLAN   senna-docusate 8.6-50 MG tablet Commonly known as: Senokot-S       TAKE these medications    amLODipine 10 MG tablet Commonly known as: NORVASC Take 1 tablet (10 mg total) by mouth daily.   famotidine 10 MG tablet Commonly known as: PEPCID Take 1 tablet (10 mg total) by mouth every Monday, Wednesday, and Friday with hemodialysis. Take after hemodialysis.   finasteride 5 MG tablet Commonly known as: PROSCAR Take 1 tablet (5 mg total) by mouth daily.   furosemide 40 MG tablet Commonly known as: LASIX Take 40 mg by mouth 2 (two) times daily.   gabapentin 300 MG capsule Commonly known as: NEURONTIN Take 1 capsule (300 mg total) by mouth daily.   hydrALAZINE 100 MG tablet Commonly known as: APRESOLINE Take 100 mg by mouth 3 (three) times daily.   Lantus SoloStar 100 UNIT/ML Solostar Pen Generic drug: insulin glargine Inject 5 Units into the skin at bedtime.   metoprolol succinate 25 MG 24 hr tablet Commonly known as: TOPROL-XL Take 0.5 tablets (12.5 mg total) by mouth daily.   polyethylene glycol 17 g packet Commonly known as: MIRALAX / GLYCOLAX Take 17 g by mouth daily.   promethazine 25 MG tablet Commonly known as: PHENERGAN Take 25 mg by mouth every 6 (six) hours as needed for nausea or vomiting.   rosuvastatin 10 MG tablet Commonly known as: CRESTOR Take 10 mg by mouth daily.   sodium bicarbonate 650 MG tablet Take 1,300 mg by mouth 2 (two) times daily.   tamsulosin 0.4 MG Caps capsule Commonly known as: FLOMAX Take 0.4 mg by mouth at  bedtime.   triamcinolone ointment 0.5 % Commonly known as: KENALOG Apply 1 Application topically 2 (two) times daily as needed (for itching).   TYLENOL 500 MG tablet Generic drug: acetaminophen Take 500-1,000 mg by mouth every 6 (six) hours as needed for mild pain or headache.   Vitamin D3 1.25 MG (50000 UT) Caps Take 50,000 Units by mouth every Friday.        Follow-up Information     [facility].   Specialty: Home Health Services Why: Someone will call you to schedule first home visit. If you have not received a call after two days of discharging home, call their number listed. If no one comes to assess, call Case Manager at [phone]. Contact information: [address] [place] [address] [phone]         Malka So., MD Follow up in 1 month(s).   Specialty: Internal Medicine Contact information: [address] [place] [address] [phone]  Allergies  Allergen Reactions   Claritin [Loratadine] Swelling and Other (See Comments)    Joint swelling    Hydrochlorothiazide Other (See Comments)    Dizziness   Latex Hives and Swelling   Lyrica [Pregabalin] Other (See Comments)    Depression. "Makes me loopy"    Metformin And Related Diarrhea and Nausea And Vomiting    Consultations: Nephrology   Procedures/Studies: CT Angio Chest PE W/Cm &/Or Wo Cm  Result Date: 05/27/2022 CLINICAL DATA:  62 year old male with clinical history concerning for potential pulmonary embolism. EXAM: CT ANGIOGRAPHY CHEST WITH CONTRAST TECHNIQUE: Multidetector CT imaging of the chest was performed using the standard protocol during bolus administration of intravenous contrast. Multiplanar CT image reconstructions and MIPs were obtained to evaluate the vascular anatomy. RADIATION DOSE REDUCTION: This exam was performed according to the departmental dose-optimization program which includes automated  exposure control, adjustment of the mA and/or kV according to patient size and/or use of iterative reconstruction technique. CONTRAST:  75mL OMNIPAQUE IOHEXOL 350 MG/ML SOLN COMPARISON:  No priors. FINDINGS: Cardiovascular: No filling defects within the pulmonary arterial tree to suggest pulmonary embolism. Heart size is borderline enlarged. Concentric left ventricular hypertrophy. There is no significant pericardial fluid, thickening or pericardial calcification. There is aortic atherosclerosis, as well as atherosclerosis of the great vessels of the mediastinum and the coronary arteries, including calcified atherosclerotic plaque in the left anterior descending, left circumflex and right coronary arteries. Right internal jugular PermCath with tip terminating in the right atrium. Mediastinum/Nodes: No pathologically enlarged mediastinal or hilar lymph nodes. Small hiatal hernia. No axillary lymphadenopathy. Lungs/Pleura: Widespread areas of ground-glass attenuation with some patchy peribronchovascular airspace consolidation and septal thickening noted in the lungs bilaterally. Small to moderate bilateral pleural effusions. No definite suspicious appearing pulmonary nodules or masses are noted. Upper Abdomen: Unremarkable. Musculoskeletal: There are no aggressive appearing lytic or blastic lesions noted in the visualized portions of the skeleton. Review of the MIP images confirms the above findings. IMPRESSION: 1. No evidence of pulmonary embolism. 2. There is diffuse ground-glass attenuation, peribronchovascular airspace consolidation and interlobular septal thickening. Overall, the appearance of the lungs is favored to reflect pulmonary edema. Small to moderate bilateral pleural effusions are also noted. Given the borderline cardiac enlargement with concentric left ventricular hypertrophy clinical correlation for signs and symptoms of potential diastolic heart failure is suggested. Alternatively, the appearance of  the lungs could suggest developing multilobar bilateral bronchopneumonia. 3. Aortic atherosclerosis, in addition to three-vessel coronary artery disease. Please note that although the presence of coronary artery calcium documents the presence of coronary artery disease, the severity of this disease and any potential stenosis cannot be assessed on this non-gated CT examination. Assessment for potential risk factor modification, dietary therapy or pharmacologic therapy may be warranted, if clinically indicated. Aortic Atherosclerosis (ICD10-I70.0). Electronically Signed   By: Trudie Reed M.D.   On: 05/27/2022 10:50   DG Chest Port 1 View  Result Date: 05/27/2022 CLINICAL DATA:  Questionable sepsis EXAM: PORTABLE CHEST 1 VIEW COMPARISON:  05/15/2022 FINDINGS: Perma catheter with tip at the upper cavoatrial junction. Borderline heart size. There is no edema, consolidation, effusion, or pneumothorax. IMPRESSION: Negative for pneumonia. Electronically Signed   By: Tiburcio Pea M.D.   On: 05/27/2022 06:38   IR Fluoro Guide CV Line Right  Result Date: 05/23/2022 CLINICAL DATA:  Progressive renal failure now requiring hemodialysis. Request to place a tunneled hemodialysis catheter. EXAM: TUNNELED CENTRAL VENOUS HEMODIALYSIS CATHETER PLACEMENT WITH ULTRASOUND AND FLUOROSCOPIC GUIDANCE ANESTHESIA/SEDATION: Moderate (conscious) sedation was  employed during this procedure. A total of Versed 1.0 mg and Fentanyl 75 mcg was administered intravenously. Moderate Sedation Time: 22 minutes. The patient's level of consciousness and vital signs were monitored continuously by radiology nursing throughout the procedure under my direct supervision. MEDICATIONS: 2 g IV Ancef. FLUOROSCOPY: 24 seconds.  4.0 mGy. PROCEDURE: The procedure, risks, benefits, and alternatives were explained to the patient. Questions regarding the procedure were encouraged and answered. The patient understands and consents to the procedure. A timeout  was performed prior to initiating the procedure. The right neck and chest were prepped with chlorhexidine in a sterile fashion, and a sterile drape was applied covering the operative field. Maximum barrier sterile technique with sterile gowns and gloves were used for the procedure. Local anesthesia was provided with 1% lidocaine. Ultrasound was used to confirm patency of the right internal jugular vein. A permanent ultrasound image was saved and recorded. After creating a small venotomy incision, a 21 gauge needle was advanced into the right internal jugular vein under direct, real-time ultrasound guidance. Ultrasound image documentation was performed. After securing guidewire access, an 8 Fr dilator was placed. A J-wire was kinked to measure appropriate catheter length. A Palindrome tunneled hemodialysis catheter measuring 19 cm from tip to cuff was chosen for placement. This was tunneled in a retrograde fashion from the chest wall to the venotomy incision. At the venotomy, serial dilatation was performed and a 15 Fr peel-away sheath was placed over a guidewire. The catheter was then placed through the sheath and the sheath removed. Final catheter positioning was confirmed and documented with a fluoroscopic spot image. The catheter was aspirated, flushed with saline, and injected with appropriate volume heparin dwells. The venotomy incision was closed with subcuticular 4-0 Vicryl. Dermabond was applied to the incision. The catheter exit site was secured with 0-Prolene retention sutures. COMPLICATIONS: None.  No pneumothorax. FINDINGS: After catheter placement, the tip lies in the right atrium. The catheter aspirates normally and is ready for immediate use. IMPRESSION: Placement of tunneled hemodialysis catheter via the right internal jugular vein. The catheter tip lies in the right atrium. The catheter is ready for immediate use. Electronically Signed   By: Irish Lack M.D.   On: 05/23/2022 14:50   IR US  Guide Vasc Access Right  Result Date: 05/23/2022 CLINICAL DATA:  Progressive renal failure now requiring hemodialysis. Request to place a tunneled hemodialysis catheter. EXAM: TUNNELED CENTRAL VENOUS HEMODIALYSIS CATHETER PLACEMENT WITH ULTRASOUND AND FLUOROSCOPIC GUIDANCE ANESTHESIA/SEDATION: Moderate (conscious) sedation was employed during this procedure. A total of Versed 1.0 mg and Fentanyl 75 mcg was administered intravenously. Moderate Sedation Time: 22 minutes. The patient's level of consciousness and vital signs were monitored continuously by radiology nursing throughout the procedure under my direct supervision. MEDICATIONS: 2 g IV Ancef. FLUOROSCOPY: 24 seconds.  4.0 mGy. PROCEDURE: The procedure, risks, benefits, and alternatives were explained to the patient. Questions regarding the procedure were encouraged and answered. The patient understands and consents to the procedure. A timeout was performed prior to initiating the procedure. The right neck and chest were prepped with chlorhexidine in a sterile fashion, and a sterile drape was applied covering the operative field. Maximum barrier sterile technique with sterile gowns and gloves were used for the procedure. Local anesthesia was provided with 1% lidocaine. Ultrasound was used to confirm patency of the right internal jugular vein. A permanent ultrasound image was saved and recorded. After creating a small venotomy incision, a 21 gauge needle was advanced into the right internal jugular  vein under direct, real-time ultrasound guidance. Ultrasound image documentation was performed. After securing guidewire access, an 8 Fr dilator was placed. A J-wire was kinked to measure appropriate catheter length. A Palindrome tunneled hemodialysis catheter measuring 19 cm from tip to cuff was chosen for placement. This was tunneled in a retrograde fashion from the chest wall to the venotomy incision. At the venotomy, serial dilatation was performed and a 15 Fr  peel-away sheath was placed over a guidewire. The catheter was then placed through the sheath and the sheath removed. Final catheter positioning was confirmed and documented with a fluoroscopic spot image. The catheter was aspirated, flushed with saline, and injected with appropriate volume heparin dwells. The venotomy incision was closed with subcuticular 4-0 Vicryl. Dermabond was applied to the incision. The catheter exit site was secured with 0-Prolene retention sutures. COMPLICATIONS: None.  No pneumothorax. FINDINGS: After catheter placement, the tip lies in the right atrium. The catheter aspirates normally and is ready for immediate use. IMPRESSION: Placement of tunneled hemodialysis catheter via the right internal jugular vein. The catheter tip lies in the right atrium. The catheter is ready for immediate use. Electronically Signed   By: Irish Lack M.D.   On: 05/23/2022 14:50   US RENAL  Result Date: 05/20/2022 CLINICAL DATA:  [id number] Acute renal failure superimposed on stage 4 chronic kidney disease, unspecified acute renal failure type [facility]) [id number] EXAM: RENAL / URINARY TRACT ULTRASOUND COMPLETE COMPARISON:  March 28, 2022. April 04, 2022 FINDINGS: Right Kidney: Renal measurements: 11.3 x 7.3 x 4.5 cm = volume: 277 mL. Echogenicity is similar in comparison to prior. No mass or hydronephrosis visualized. Left Kidney: Renal measurements: 11.6 x 6.6 x 6.1 cm = volume: 243 mL. Echogenicity is similar in comparison to prior. No mass or hydronephrosis visualized. Bladder: Bladder wall is mildly prominent for degree of distension. Other: None. IMPRESSION: 1. No hydronephrosis. 2. Bladder wall is mildly prominent for degree of distension, similar in comparison to prior CT. This could reflect the sequela of chronic outlet obstruction. Correlate with urinalysis. Electronically Signed   By: Meda Klinefelter M.D.   On: 05/20/2022 15:50   DG FEMUR MIN 2 VIEWS LEFT  Result Date: 05/18/2022 CLINICAL DATA:   Pain EXAM: LEFT FEMUR 2 VIEWS COMPARISON:  None Available. FINDINGS: No fracture or dislocation is seen. There are no focal lytic lesions. Small bony spurs are seen in the patella. There is calcification in medial and lateral meniscal cartilages. Extensive arterial calcifications are seen in soft tissues. IMPRESSION: No acute findings are seen in left femur. Degenerative changes are noted in the left knee with bony spurs and chondrocalcinosis. Extensive arterial calcifications are seen. Electronically Signed   By: Ernie Avena M.D.   On: 05/18/2022 12:29   DG Chest 2 View  Result Date: 05/15/2022 CLINICAL DATA:  Shortness of breath. EXAM: CHEST - 2 VIEW COMPARISON:  Chest radiographs 05/04/2022 FINDINGS: The cardiac silhouette is borderline enlarged convex situated by AP technique and low lung volumes. No airspace consolidation, edema, pleural effusion, or pneumothorax is identified. No acute osseous abnormality is seen. IMPRESSION: No active cardiopulmonary disease. Electronically Signed   By: Sebastian Ache M.D.   On: 05/15/2022 13:25   DG Chest 2 View  Result Date: 05/04/2022 CLINICAL DATA:  62 year old male with shortness of breath. Vomiting. EXAM: CHEST - 2 VIEW COMPARISON:  Chest and abdominal radiographs 04/04/2022 and earlier. FINDINGS: Upright AP and lateral views of the chest at 0714 hours. Mildly lower lung volumes. Mediastinal contours remain  within normal limits. Both lungs appear clear. No pneumothorax or pleural effusion. Negative visible bowel gas. No evidence of pneumoperitoneum. Visualized tracheal air column is within normal limits. No osseous abnormality identified. IMPRESSION: Lower lung volumes. No acute cardiopulmonary abnormality. Electronically Signed   By: Odessa Fleming M.D.   On: 05/04/2022 07:21     Discharge Exam: Vitals:   05/28/22 1242 05/28/22 1249  BP: (!) 157/79 (!) 168/78  Pulse: 80 78  Resp: 15 18  Temp: 98.4 F (36.9 C)   SpO2: 100% 100%   Vitals:   05/28/22  1154 05/28/22 1224 05/28/22 1242 05/28/22 1249  BP: (!) 151/75 (!) 154/80 (!) 157/79 (!) 168/78  Pulse: 80 79 80 78  Resp: 16 13 15 18   Temp:   98.4 F (36.9 C)   TempSrc:   Oral   SpO2: 100% 100% 100% 100%  Weight:   98.2 kg   Height:        General: Pt is alert, awake, not in acute distress Cardiovascular: RRR, S1/S2 +, no rubs, no gallops Respiratory: CTA bilaterally, no wheezing, no rhonchi Abdominal: Soft, NT, ND, bowel sounds + Extremities: no edema, no cyanosis    The results of significant diagnostics from this hospitalization (including imaging, microbiology, ancillary and laboratory) are listed below for reference.     Microbiology: Recent Results (from the past 240 hour(s))  Surgical PCR screen     Status: None   Collection Time: 05/23/22  8:40 PM   Specimen: Nasal Mucosa; Nasal Swab  Result Value Ref Range Status   MRSA, PCR NEGATIVE NEGATIVE Final   Staphylococcus aureus NEGATIVE NEGATIVE Final    Comment: (NOTE) The Xpert SA Assay (FDA approved for NASAL specimens in patients 37 years of age and older), is one component of a comprehensive surveillance program. It is not intended to diagnose infection nor to guide or monitor treatment. Performed at [facility], [address], [place], [place] [address]   Blood Culture (routine x 2)     Status: None (Preliminary result)   Collection Time: 05/27/22  6:30 AM   Specimen: BLOOD LEFT HAND  Result Value Ref Range Status   Specimen Description BLOOD LEFT HAND  Final   Special Requests   Final    BOTTLES DRAWN AEROBIC AND ANAEROBIC Blood Culture adequate volume   Culture   Final    NO GROWTH 1 DAY Performed at [facility], [address], [place], [place] [address]    Report Status PENDING  Incomplete  Blood Culture (routine x 2)     Status: None (Preliminary result)   Collection Time: 05/27/22  6:35 AM   Specimen: BLOOD  Result Value Ref Range Status   Specimen Description BLOOD BLOOD LEFT  WRIST  Final   Special Requests   Final    BOTTLES DRAWN AEROBIC AND ANAEROBIC Blood Culture results may not be optimal due to an excessive volume of blood received in culture bottles   Culture   Final    NO GROWTH 1 DAY Performed at [facility], [address], [place], [place] [address]    Report Status PENDING  Incomplete     Labs: BNP (last 3 results) Recent Labs    05/04/22 0652 05/16/22 0032 05/27/22 0630  BNP 393.4* 187.8* 1,478.7*   Basic Metabolic Panel: Recent Labs  Lab 05/22/22 0353 05/23/22 0208 05/24/22 0445 05/25/22 1155 05/27/22 0630 05/27/22 0635 05/27/22 0636 05/28/22 0206  NA 133* 134* 136 136 136  138 138 136  K 4.9 4.9 4.1 4.2 3.8 3.9 4.0 3.5  CL 102 103 103 100 100  --  104 102  CO2 18* 20* 21* 26 20*  --   --  23  GLUCOSE 136* 140* 117* 118* 154*  --  153* 91  BUN 121* 120* 84* 51* 34*  --  33* 39*  CREATININE 8.34* 8.23* 6.49* 5.05* 4.41*  --  5.10* 4.80*  CALCIUM 8.1* 8.0* 8.2* 8.2* 8.6*  --   --  8.4*  MG  --   --   --   --  1.4*  --   --   --   PHOS 8.8* 8.3*  --  4.5  --   --   --   --    Liver Function Tests: Recent Labs  Lab 05/22/22 0353 05/23/22 0208 05/25/22 1155 05/27/22 0630 05/28/22 0206  AST  --   --   --  20 15  ALT  --   --   --  7 7  ALKPHOS  --   --   --  58 45  BILITOT  --   --   --  0.9 0.6  PROT  --   --   --  7.3 6.6  ALBUMIN 2.4* 2.4* 2.7* 3.2* 2.8*   No results for input(s): "LIPASE", "AMYLASE" in the last 168 hours. No results for input(s): "AMMONIA" in the last 168 hours. CBC: Recent Labs  Lab 05/24/22 0445 05/25/22 1155 05/27/22 0630 05/27/22 0635 05/27/22 0636 05/28/22 0206  WBC 5.7 6.6 12.1*  --   --  8.6  NEUTROABS  --   --  10.6*  --   --   --   HGB 9.5* 9.6* 10.0* 10.9* 10.9* 8.7*  HCT 27.4* 30.1* 31.4* 32.0* 32.0* 27.0*  MCV 87.3 92.6 92.4  --   --  92.8  PLT 269 273 272  --   --  251   Cardiac Enzymes: No results for input(s): "CKTOTAL", "CKMB", "CKMBINDEX", "TROPONINI" in the last  168 hours. BNP: Invalid input(s): "POCBNP" CBG: Recent Labs  Lab 05/24/22 2112 05/25/22 0727 05/25/22 1609 05/27/22 1536 05/27/22 2147  GLUCAP 178* 131* 91 88 94   D-Dimer Recent Labs    05/27/22 0811  DDIMER 1.94*   Hgb A1c No results for input(s): "HGBA1C" in the last 72 hours. Lipid Profile No results for input(s): "CHOL", "HDL", "LDLCALC", "TRIG", "CHOLHDL", "LDLDIRECT" in the last 72 hours. Thyroid function studies No results for input(s): "TSH", "T4TOTAL", "T3FREE", "THYROIDAB" in the last 72 hours.  Invalid input(s): "FREET3" Anemia work up No results for input(s): "VITAMINB12", "FOLATE", "FERRITIN", "TIBC", "IRON", "RETICCTPCT" in the last 72 hours. Urinalysis    Component Value Date/Time   COLORURINE YELLOW 05/27/2022 1310   APPEARANCEUR CLEAR 05/27/2022 1310   LABSPEC 1.012 05/27/2022 1310   PHURINE 7.0 05/27/2022 1310   GLUCOSEU NEGATIVE 05/27/2022 1310   HGBUR SMALL (A) 05/27/2022 1310   BILIRUBINUR NEGATIVE 05/27/2022 1310   KETONESUR 5 (A) 05/27/2022 1310   PROTEINUR >=300 (A) 05/27/2022 1310   NITRITE NEGATIVE 05/27/2022 1310   LEUKOCYTESUR NEGATIVE 05/27/2022 1310   Sepsis Labs Recent Labs  Lab 05/24/22 0445 05/25/22 1155 05/27/22 0630 05/28/22 0206  WBC 5.7 6.6 12.1* 8.6   Microbiology Recent Results (from the past 240 hour(s))  Surgical PCR screen     Status: None   Collection Time: 05/23/22  8:40 PM   Specimen: Nasal Mucosa; Nasal Swab  Result Value Ref Range Status  MRSA, PCR NEGATIVE NEGATIVE Final   Staphylococcus aureus NEGATIVE NEGATIVE Final    Comment: (NOTE) The Xpert SA Assay (FDA approved for NASAL specimens in patients 3 years of age and older), is one component of a comprehensive surveillance program. It is not intended to diagnose infection nor to guide or monitor treatment. Performed at [facility], [address] [place], [place] [address]   Blood Culture (routine x 2)     Status: None (Preliminary  result)   Collection Time: 05/27/22  6:30 AM   Specimen: BLOOD LEFT HAND  Result Value Ref Range Status   Specimen Description BLOOD LEFT HAND  Final   Special Requests   Final    BOTTLES DRAWN AEROBIC AND ANAEROBIC Blood Culture adequate volume   Culture   Final    NO GROWTH 1 DAY Performed at [facility], [address] [place], [place] [address]    Report Status PENDING  Incomplete  Blood Culture (routine x 2)     Status: None (Preliminary result)   Collection Time: 05/27/22  6:35 AM   Specimen: BLOOD  Result Value Ref Range Status   Specimen Description BLOOD BLOOD LEFT WRIST  Final   Special Requests   Final    BOTTLES DRAWN AEROBIC AND ANAEROBIC Blood Culture results may not be optimal due to an excessive volume of blood received in culture bottles   Culture   Final    NO GROWTH 1 DAY Performed at [facility], [address] [place], [place] [address]    Report Status PENDING  Incomplete     Time coordinating discharge: Over 30 minutes  SIGNED:   Hughie Closs, MD  [facility] 05/28/2022, 2:55 PM *Please note that this is a verbal dictation therefore any spelling or grammatical errors are due to the "Dragon Medical One" system interpretation. If 7PM-7AM, please contact night-coverage [email or web address]

## 2022-05-28 NOTE — Progress Notes (Addendum)
Advised by Renal Educator of pt's return to hospital. [facility] with with [facility] out-pt HD to make her aware that pt has been re-hospitalized and will not be starting at [facility] today as planned. H and P and renal consult note faxed to [facility] per her request. Will provide updates to [facility] regarding pt's d/c date in order to coordinate pt's start date at [facility]. Will assist as needed.   Olivia Canter Renal Navigator [phone]  Addendum at 4:22 pm: D/C order noted. Contacted Beverly with [facility] to make her aware that pt will d/c today and will start at [facility] on Wednesday. Pt's d/c summary and today's renal note faxed to [facility] to provide to clinic for continuation of care.

## 2022-05-28 NOTE — Evaluation (Signed)
Physical Therapy Evaluation Patient Details Name: Ryan Jenkins MRN: [id number] DOB: Mar 11, 1960 Today's Date: 05/28/2022  History of Present Illness  62 y.o. male presents to [facility] hospital on 05/27/2022 with fatigue, nausea and vomiting. Pt was recently admitted from 4/30-5/10 with AKI progressing to ESRD, along with UTI. Imaging this admission consistent with pulmonary edema vs early PNA. PMH includes: diabetes with neuropathy and gastroparesis, HTN, CKD 4, depression, anxiety, HLD, GERD, CHF, CAD, anemia  Clinical Impression  Pt presents to PT with deficits in strength, power, endurance, gait, balance. Pt is able to ambulate for household distances with support of rollator. Pt appears to fatigue quickly, although activity tolerance at his baseline is limited. Pt will benefit from continued frequent mobilization in an effort to improve activity tolerance. Pt reports a brake on his rollator is broken, he reports he will purchase a new one on his own if not covered by insurance in the hospital. PT recommends continued HHPT.       Recommendations for follow up therapy are one component of a multi-disciplinary discharge planning process, led by the attending physician.  Recommendations may be updated based on patient status, additional functional criteria and insurance authorization.  Follow Up Recommendations       Assistance Recommended at Discharge Intermittent Supervision/Assistance  Patient can return home with the following  A little help with bathing/dressing/bathroom;Assistance with cooking/housework;Assist for transportation;Help with stairs or ramp for entrance    Equipment Recommendations Rollator (4 wheels) (pt will purchase himself if not covered by insurance)  Recommendations for Other Services       Functional Status Assessment Patient has had a recent decline in their functional status and demonstrates the ability to make significant improvements in function in a reasonable and  predictable amount of time.     Precautions / Restrictions Precautions Precautions: Fall Restrictions Weight Bearing Restrictions: No      Mobility  Bed Mobility Overal bed mobility: Needs Assistance Bed Mobility: Supine to Sit     Supine to sit: Supervision, HOB elevated          Transfers Overall transfer level: Needs assistance Equipment used: Rollator (4 wheels) Transfers: Sit to/from Stand Sit to Stand: Supervision, From elevated surface           General transfer comment: increased time to ascend into standing    Ambulation/Gait Ambulation/Gait assistance: Supervision Gait Distance (Feet): 200 Feet Assistive device: Rollator (4 wheels) Gait Pattern/deviations: Step-through pattern Gait velocity: reduced Gait velocity interpretation: <1.8 ft/sec, indicate of risk for recurrent falls   General Gait Details: slowed step-through gait  Stairs            Wheelchair Mobility    Modified Rankin (Stroke Patients Only)       Balance Overall balance assessment: Needs assistance Sitting-balance support: No upper extremity supported, Feet supported Sitting balance-Leahy Scale: Good     Standing balance support: Single extremity supported, Reliant on assistive device for balance Standing balance-Leahy Scale: Poor                               Pertinent Vitals/Pain Pain Assessment Pain Assessment: No/denies pain    Home Living Family/patient expects to be discharged to:: Private residence Living Arrangements: Other relatives (brother) Available Help at Discharge: Family;Available PRN/intermittently;Personal care attendant Type of Home: Apartment Home Access: Level entry       Home Layout: One level Home Equipment: Agricultural consultant (2 wheels);Rollator (4 wheels);BSC/3in1;Wheelchair - manual;Shower  seat Additional Comments: pt reports his rollator has a broken brake. Brother works 5A[employer]    Prior Function Prior Level of Function :  Needs assist             Mobility Comments: mostly uses WC in the home, rollator outside of the home; could ambulate in community but fatigues; was getting HHPT ADLs Comments: has PCA 3x a week to assist with IADLs. reports MOD I for ADLs, uses shower chair, can go into kitchen for simple meals if needed but brother also brings meals home after work. pt does not drive. reports working with HHOT on grip strength due to neuropathy     Hand Dominance   Dominant Hand: Right    Extremity/Trunk Assessment   Upper Extremity Assessment Upper Extremity Assessment: Generalized weakness    Lower Extremity Assessment Lower Extremity Assessment: Generalized weakness    Cervical / Trunk Assessment Cervical / Trunk Assessment: Kyphotic  Communication   Communication: No difficulties  Cognition Arousal/Alertness: Awake/alert Behavior During Therapy: WFL for tasks assessed/performed Overall Cognitive Status: Within Functional Limits for tasks assessed                                          General Comments General comments (skin integrity, edema, etc.): VSS on RA, sats from 92-95% with ambulation on room air    Exercises     Assessment/Plan    PT Assessment Patient needs continued PT services  PT Problem List Decreased strength;Decreased activity tolerance;Decreased balance;Decreased mobility;Decreased knowledge of use of DME;Cardiopulmonary status limiting activity       PT Treatment Interventions DME instruction;Gait training;Functional mobility training;Therapeutic activities;Therapeutic exercise;Balance training;Neuromuscular re-education;Patient/family education    PT Goals (Current goals can be found in the Care Plan section)  Acute Rehab PT Goals Patient Stated Goal: to return home, improve activity tolerance and strength PT Goal Formulation: With patient Time For Goal Achievement: 06/11/22 Potential to Achieve Goals: Good    Frequency Min 2X/week      Co-evaluation               AM-PAC PT "6 Clicks" Mobility  Outcome Measure Help needed turning from your back to your side while in a flat bed without using bedrails?: None Help needed moving from lying on your back to sitting on the side of a flat bed without using bedrails?: None Help needed moving to and from a bed to a chair (including a wheelchair)?: A Little Help needed standing up from a chair using your arms (e.g., wheelchair or bedside chair)?: A Little Help needed to walk in hospital room?: A Little Help needed climbing 3-5 steps with a railing? : A Little 6 Click Score: 20    End of Session Equipment Utilized During Treatment: Gait belt Activity Tolerance: Patient tolerated treatment well Patient left: in bed;with call bell/phone within reach Nurse Communication: Mobility status PT Visit Diagnosis: Other abnormalities of gait and mobility (R26.89);Muscle weakness (generalized) (M62.81)    Time: 1610-9604 PT Time Calculation (min) (ACUTE ONLY): 29 min   Charges:   PT Evaluation $PT Eval Low Complexity: 1 Low          Arlyss Gandy, PT, DPT Acute Rehabilitation Office [phone]   Arlyss Gandy 05/28/2022, 2:57 PM

## 2022-05-28 NOTE — Progress Notes (Signed)
Heart Failure Navigator Progress Note  Assessed for Heart & Vascular TOC clinic readiness.  Patient does not meet criteria due to ESRD on hemodialysis.   Navigator will sign off at this time.    Raidon Swanner, BSN, RN Heart Failure Nurse Navigator Secure Chat Only   

## 2022-05-28 NOTE — TOC Transition Note (Signed)
Transition of Care [facility]) - CM/SW Discharge Note   Patient Details  Name: Ryan Jenkins MRN: [id number] Date of Birth: 1960/12/28  [facility]) CM/SW Contact:  Tom-Johnson, Hershal Coria, RN Phone Number: 05/28/2022, 2:56 PM   Clinical Narrative:     Patient is scheduled for discharge today.  Home health resumption of care info, hospital f/u and discharge instructions on AVS. Patient has scheduled outpatient dialysis on Wednesday and will be transported by [facility] transportation.  Patient requested for a new rollator and the one he currently has is not high enough for his height. Patient informed that he will have to pay out of pocket d/t having current rollator within 5 yrs. Patient states he will order one from [place]. Brother, Marilu Favre to transport at discharge.  No further TOC needs noted.       Final next level of care: Home w Home Health Services Barriers to Discharge: Barriers Resolved   Patient Goals and CMS Choice CMS Medicare.gov Compare Post Acute Care list provided to:: Patient Choice offered to / list presented to : Patient  Discharge Placement                  Patient to be transferred to facility by: Brother Name of family member notified: [facility]    Discharge Plan and Services Additional resources added to the After Visit Summary for                  DME Arranged: N/A DME Agency: NA       HH Arranged: PT, RN (Resumption of care) HH Agency: [facility] Date [facility] Agency Contacted: 05/28/22 Time HH Agency Contacted: 1440 Representative spoke with at [facility] Agency: Tresa Endo  Social Determinants of Health (SDOH) Interventions SDOH Screenings   Food Insecurity: No Food Insecurity (05/27/2022)  Housing: Low Risk  (05/27/2022)  Transportation Needs: No Transportation Needs (05/27/2022)  Utilities: Not At Risk (05/27/2022)  Depression (PHQ2-9): Medium Risk (08/07/2019)  Tobacco Use: Low Risk  (05/27/2022)     Readmission Risk  Interventions    05/18/2022    9:11 AM 04/05/2022   11:10 AM 03/26/2022    5:46 PM  Readmission Risk Prevention Plan  Transportation Screening Complete Complete Complete  Medication Review (RN Care Manager) Referral to Pharmacy Complete Referral to Pharmacy  PCP or Specialist appointment within 3-5 days of discharge Complete Complete   HRI or Home Care Consult Complete Complete Complete  SW Recovery Care/Counseling Consult Complete Complete Complete  Palliative Care Screening Not Applicable Not Applicable Not Applicable  Skilled Nursing Facility Not Applicable Not Applicable Not Applicable

## 2022-05-28 NOTE — Progress Notes (Signed)
[facility] Progress Note   Assessment/ Plan:   Ryan Jenkins is a/an 62 y.o. male with a past medical history significant for CKD 5, DM2, HTN, recurrent MDR UTI, admitted for AKI/urinary retention, lower extremity edema.          ESRD: AKI during recent admission and discharged to start dialysis outpatient at [facility].  Now back with several symptoms. -Plan for dialysis tomorrow per planned outpatient schedule of MWF-- helathy UF goal -Status post right upper extremity AVF with Dr. Karin Lieu on 5/9. Appreciate help -Continue to monitor daily Cr, Dose meds for GFR -Monitor Daily I/Os, Daily weight  -Maintain MAP>65 for optimal renal perfusion.  -Avoid nephrotoxic medications including NSAIDs -Use synthetic opioids (Fentanyl/Dilaudid) if needed   Chest pain: CTA per primary team.   Hypoxia: Mild.  Possibly related to aspiration given his nausea and vomiting.   Concern for infection.  Recent significant UTI only sensitive to meropenem.  Blood cultures and further workup per primary team   Hypertension: Can continue home medications.   Anemia due to CKD/chronic inflammation; hemoglobin at goal.     Hyperphosphatemia: Continue home sevelamer  Subjective:    Seen on HD.  Tolerating well.  Feeling much better.     Objective:   BP (!) 168/78 (BP Location: Left Arm)   Pulse 78   Temp 98.4 F (36.9 C) (Oral)   Resp 18   Ht 6\' 2"  (1.88 m)   Wt 98.2 kg   SpO2 100%   BMI 27.80 kg/m   Physical Exam: Gen:NAD, sitting in bed CVS: RRR Resp: clear Abd: soft Ext:no LE edema ACCESS: TDC, AVF + T/B  Labs: BMET Recent Labs  Lab 05/22/22 0353 05/23/22 0208 05/24/22 0445 05/25/22 1155 05/27/22 0630 05/27/22 0635 05/27/22 0636 05/28/22 0206  NA 133* 134* 136 136 136 138 138 136  K 4.9 4.9 4.1 4.2 3.8 3.9 4.0 3.5  CL 102 103 103 100 100  --  104 102  CO2 18* 20* 21* 26 20*  --   --  23  GLUCOSE 136* 140* 117* 118* 154*  --  153* 91  BUN 121* 120* 84* 51* 34*   --  33* 39*  CREATININE 8.34* 8.23* 6.49* 5.05* 4.41*  --  5.10* 4.80*  CALCIUM 8.1* 8.0* 8.2* 8.2* 8.6*  --   --  8.4*  PHOS 8.8* 8.3*  --  4.5  --   --   --   --    CBC Recent Labs  Lab 05/24/22 0445 05/25/22 1155 05/27/22 0630 05/27/22 0635 05/27/22 0636 05/28/22 0206  WBC 5.7 6.6 12.1*  --   --  8.6  NEUTROABS  --   --  10.6*  --   --   --   HGB 9.5* 9.6* 10.0* 10.9* 10.9* 8.7*  HCT 27.4* 30.1* 31.4* 32.0* 32.0* 27.0*  MCV 87.3 92.6 92.4  --   --  92.8  PLT 269 273 272  --   --  251      Medications:     amLODipine  10 mg Oral Daily   Chlorhexidine Gluconate Cloth  6 each Topical Q0600   famotidine  10 mg Oral Q M,W,F-HD   finasteride  5 mg Oral Daily   gabapentin  300 mg Oral Daily   heparin  5,000 Units Subcutaneous Q8H   hydrALAZINE  100 mg Oral TID   metoprolol succinate  12.5 mg Oral Daily   rosuvastatin  10 mg Oral Daily   sodium  chloride flush  3 mL Intravenous Q12H   tamsulosin  0.4 mg Oral QHS     Bufford Buttner, MD 05/28/2022, 1:48 PM

## 2022-05-28 NOTE — Procedures (Signed)
Patient seen and examined on Hemodialysis. The procedure was supervised and I have made appropriate changes. BP (!) 168/78 (BP Location: Left Arm)   Pulse 78   Temp 98.4 F (36.9 C) (Oral)   Resp 18   Ht 6\' 2"  (1.88 m)   Wt 98.2 kg   SpO2 100%   BMI 27.80 kg/m   QB 400 mL/ min , UF goal 3L  Tolerating treatment without complaints at this time.   Bufford Buttner MD [facility] Pgr [phone] 1:54 PM

## 2022-05-30 LAB — CULTURE, BLOOD (ROUTINE X 2)

## 2022-06-01 LAB — CULTURE, BLOOD (ROUTINE X 2)

## 2022-06-18 ENCOUNTER — Encounter (HOSPITAL_COMMUNITY): Payer: Self-pay | Admitting: *Deleted

## 2022-06-18 ENCOUNTER — Ambulatory Visit (HOSPITAL_COMMUNITY)
Admission: EM | Admit: 2022-06-18 | Discharge: 2022-06-18 | Disposition: A | Discharge status: Home / Self Care | Payer: Medicaid Other | Attending: Internal Medicine | Admitting: Internal Medicine

## 2022-06-18 DIAGNOSIS — G4486 Cervicogenic headache: Secondary | ICD-10-CM | POA: Diagnosis not present

## 2022-06-18 HISTORY — DX: Dependence on renal dialysis: Z99.2

## 2022-06-18 MED ORDER — TRAMADOL HCL 50 MG PO TABS: 25.0000 mg | ORAL_TABLET | Freq: Two times a day (BID) | ORAL | 0 refills | Status: DC | PRN

## 2022-06-18 NOTE — ED Triage Notes (Addendum)
Pt states he just left hemodialysis.  C/O constant "muscle cramping" sensation on both sides of upper scalp radiating down into bilat neck onset yesterday. States crampins is worse when turning his head to the sides. Denies any lightheadedness or dizziness, but c/o transient "haze" to vision in bilat eyes and also c/o transient loss of hearing in right ear only. Denies HA. States he feels "a little" more generalized weakness than normal. Pt's brother states his speech, ambulation, and behavior seem normal for him. Pt states he has tried Tylenol and heat.  Pt unable to verify list of current meds.

## 2022-06-18 NOTE — ED Provider Notes (Signed)
[facility]    CSN: [id number] Arrival date & time: 06/18/22  1635      History   Chief Complaint Chief Complaint  Patient presents with   Scalp Pain    HPI Ryan Jenkins is a 62 y.o. male comes to urgent care with headache which started yesterday.  Patient describes the headache as throbbing mainly in the occipital region and the neck.  Patient denies any falls or trauma.  Headache is of moderate severity.  He has taken Tylenol with no improvement in symptoms.  He used a heating pad for about 4 hours yesterday but the pain worsened after the heating pad use.  Pain does not radiate into the extremities.  No numbness or tingling in the upper extremities.  Patient complains of decreased hearing in the right ear.  He recently started hemodialysis about 3 weeks ago.  Decreased hearing in the ear has been waxing and waning since he started hemodialysis.  No discharge from the ears.Ryan Jenkins   HPI  Past Medical History:  Diagnosis Date   Anemia    Anginal pain (HCC)    Anxiety    Arthritis    BPH with obstruction/lower urinary tract symptoms    CAD (coronary artery disease)    cardiologist--- dr Lucienne Minks badal;  11-06-2019 cardiac cath in [place] (result in care everywhere)  nonobstructive cad involing pRCA 60% (done in setting worseing CHF/ acute pulmonary edema requiring intubation/ AKI   Chronic combined systolic and diastolic CHF (congestive heart failure) (HCC)    Chronic kidney disease, stage IV (severe) (HCC)    nephrologist--- dr Ronalee Belts   Depression    Diabetic neuropathy (HCC)    Dyspnea    Edema of both lower extremities    Gastric ulcer without hemorrhage or perforation 12/25/2018   GERD (gastroesophageal reflux disease)    Heart murmur    Hemodialysis patient (HCC)    History of community acquired pneumonia    ad[facility] 06-04-2021 [facility]  w/ ARF hypoxia w/ severe sepsis   Hyperlipidemia    Hypertension    Insulin dependent type 2 diabetes mellitus (HCC)     LBBB (left bundle branch block)    Pneumonia    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)    Uses walker    Vitamin B12 deficiency    Vitreous hemorrhage (HCC)     Patient Active Problem List   Diagnosis Date Noted   Acute respiratory failure with hypoxia (HCC) 05/27/2022   AKI (acute kidney injury) (HCC) 05/23/2022   CKD (chronic kidney disease) stage 5, GFR less than 15 ml/min (HCC) 04/24/2022   Diabetic gastroparesis (HCC) 04/09/2022   Chronic diastolic heart failure (HCC) 04/04/2022   Anemia in chronic kidney disease (CKD) 10/18/2021   BPH (benign prostatic hyperplasia) 10/16/2021   Coronary artery disease 02/07/2021   Erectile dysfunction associated with type 2 diabetes mellitus (HCC) 08/07/2019   Vitreous hemorrhage of left eye (HCC) 07/22/2019   Vision loss of left eye 07/22/2019   History of medication noncompliance 04/02/2019   Gastroesophageal reflux disease without esophagitis 09/04/2018   Diabetic retinopathy of both eyes associated with type 2 diabetes mellitus (HCC) 01/03/2018   Intermittent diarrhea 09/27/2017   Macroalbuminuric diabetic nephropathy (HCC) 06/25/2017   History of falling 06/25/2017   HLD (hyperlipidemia) 03/21/2017   Vitamin B 12 deficiency 03/21/2017   DM2 (diabetes mellitus, type 2) (HCC) 02/05/2017   Essential hypertension 02/05/2017   Depression 02/05/2017   Unintended weight loss 02/05/2017   Gait disturbance  02/05/2017   Pronation deformity of both feet 05/11/2014   Diabetic neuropathy, type II diabetes mellitus (HCC) 05/11/2014   Metatarsal deformity 05/11/2014    Past Surgical History:  Procedure Laterality Date   AV FISTULA PLACEMENT Right 05/24/2022   Procedure: RADIOCEPHALIC ARTERIOVENOUS (AV) FISTULA CREATION;  Surgeon: Victorino Sparrow, MD;  Location: [facility] OR;  Service: Vascular;  Laterality: Right;   CARDIAC CATHETERIZATION  11/06/2019   [facility] in [place];    nonobstructive CAD , pRCA 60% (result in care  everywhere)   CATARACT EXTRACTION W/ INTRAOCULAR LENS IMPLANT Bilateral 2017   IR FLUORO GUIDE CV LINE RIGHT  05/23/2022   IR US GUIDE VASC ACCESS RIGHT  05/23/2022   TRANSURETHRAL RESECTION OF PROSTATE N/A 10/16/2021   Procedure: TRANSURETHRAL RESECTION OF THE PROSTATE (TURP);  Surgeon: Jannifer Hick, MD;  Location: [facility] ORS;  Service: Urology;  Laterality: N/A;       Home Medications    Prior to Admission medications   Medication Sig Start Date End Date Taking? Authorizing Provider  traMADol (ULTRAM) 50 MG tablet Take 0.5 tablets (25 mg total) by mouth every 12 (twelve) hours as needed. 06/18/22  Yes Zoejane Gaulin, Britta Mccreedy, MD  amLODipine (NORVASC) 10 MG tablet Take 1 tablet (10 mg total) by mouth daily. 02/08/21   Danford, Earl Lites, MD  Cholecalciferol (VITAMIN D3) 1.25 MG (50000 UT) CAPS Take 50,000 Units by mouth every Friday. 09/12/21   [provider]  famotidine (PEPCID) 10 MG tablet Take 1 tablet (10 mg total) by mouth every Monday, Wednesday, and Friday with hemodialysis. Take after hemodialysis. 05/25/22 06/24/22  Narda Bonds, MD  finasteride (PROSCAR) 5 MG tablet Take 1 tablet (5 mg total) by mouth daily. 07/20/20   Hoy Register, MD  furosemide (LASIX) 40 MG tablet Take 40 mg by mouth 2 (two) times daily. 05/24/22   [provider]  gabapentin (NEURONTIN) 300 MG capsule Take 1 capsule (300 mg total) by mouth daily. 05/25/22   Narda Bonds, MD  hydrALAZINE (APRESOLINE) 100 MG tablet Take 100 mg by mouth 3 (three) times daily. 04/25/22   [provider]  LANTUS SOLOSTAR 100 UNIT/ML Solostar Pen Inject 5 Units into the skin at bedtime. 03/29/22   Lorin Glass, MD  metoprolol succinate (TOPROL-XL) 25 MG 24 hr tablet Take 0.5 tablets (12.5 mg total) by mouth daily. 03/30/22 06/28/22  Dahal, Melina Schools, MD  polyethylene glycol (MIRALAX / GLYCOLAX) 17 g packet Take 17 g by mouth daily. 03/30/22   Lorin Glass, MD  promethazine (PHENERGAN) 25 MG tablet Take 25 mg by mouth  every 6 (six) hours as needed for nausea or vomiting. 05/25/22   [provider]  rosuvastatin (CRESTOR) 10 MG tablet Take 10 mg by mouth daily.    [provider]  sodium bicarbonate 650 MG tablet Take 1,300 mg by mouth 2 (two) times daily. 09/20/21   [provider]  tamsulosin (FLOMAX) 0.4 MG CAPS capsule Take 0.4 mg by mouth at bedtime. 05/25/21   [provider]  triamcinolone ointment (KENALOG) 0.5 % Apply 1 Application topically 2 (two) times daily as needed (for itching). 03/27/22   [provider]  TYLENOL 500 MG tablet Take 500-1,000 mg by mouth every 6 (six) hours as needed for mild pain or headache.    [provider]    Family History Family History  Problem Relation Age of Onset   Renal cancer Mother    Hypertension Mother    Pancreatic cancer Mother  Hypertension Sister    Stroke Sister    Leukemia Maternal Uncle    Sickle cell trait Maternal Aunt    Colon cancer Neg Hx    Esophageal cancer Neg Hx    Rectal cancer Neg Hx    Stomach cancer Neg Hx     Social History Social History   Tobacco Use   Smoking status: Never    Passive exposure: Never   Smokeless tobacco: Never  Vaping Use   Vaping Use: Never used  Substance Use Topics   Alcohol use: No    Alcohol/week: 0.0 standard drinks of alcohol   Drug use: No     Allergies   Claritin [loratadine], Hydrochlorothiazide, Latex, Lyrica [pregabalin], and Metformin and related   Review of Systems Review of Systems As per HPI  Physical Exam Triage Vital Signs ED Triage Vitals  Enc Vitals Group     BP 06/18/22 1648 (!) 146/72     Pulse Rate 06/18/22 1648 88     Resp 06/18/22 1656 20     Temp 06/18/22 1656 98.6 F (37 C)     Temp Source 06/18/22 1656 Oral     SpO2 06/18/22 1648 100 %     Weight --      Height --      Head Circumference --      Peak Flow --      Pain Score --      Pain Loc --      Pain Edu? --      Excl. in GC? --    No data  found.  Updated Vital Signs BP (!) 146/72   Pulse 88   Temp 98.6 F (37 C) (Oral)   Resp 20   SpO2 100%   Visual Acuity Right Eye Distance:   Left Eye Distance:   Bilateral Distance:    Right Eye Near:   Left Eye Near:    Bilateral Near:     Physical Exam Vitals and nursing note reviewed.  Constitutional:      General: He is not in acute distress.    Appearance: Normal appearance. He is not ill-appearing.  HENT:     Right Ear: Tympanic membrane normal.     Left Ear: Tympanic membrane normal.  Cardiovascular:     Rate and Rhythm: Normal rate and regular rhythm.  Musculoskeletal:        General: Normal range of motion.     Cervical back: Normal range of motion and neck supple.     Comments: Tenderness at the site of muscle insertion into the occiput.  Pain is more pronounced if patient flexes cervical spine.  Tenderness is reproducible.  Neurological:     Mental Status: He is alert.      UC Treatments / Results  Labs (all labs ordered are listed, but only abnormal results are displayed) Labs Reviewed - No data to display  EKG   Radiology No results found.  Procedures Procedures (including critical care time)  Medications Ordered in UC Medications - No data to display  Initial Impression / Assessment and Plan / UC Course  I have reviewed the triage vital signs and the nursing notes.  Pertinent labs & imaging results that were available during my care of the patient were reviewed by me and considered in my medical decision making (see chart for details).     1.  Cervicogenic headache: Continue Tylenol as needed for pain Tramadol 25 mg every 12 hours as needed for pain  Heating pad use only 20 minutes on-20 minutes off cycle Gentle range of motion exercises Please return to urgent care if you have any other concerns. Final Clinical Impressions(s) / UC Diagnoses   Final diagnoses:  Cervicogenic headache     Discharge Instructions      Gentle  range of motion exercises Heating pad use only 20 minutes on-20 minutes off cycle If you have worsening symptoms please return to urgent care to be reevaluated There is no indication for imaging at this time.    ED Prescriptions     Medication Sig Dispense Auth. Provider   traMADol (ULTRAM) 50 MG tablet Take 0.5 tablets (25 mg total) by mouth every 12 (twelve) hours as needed. 10 tablet Doha Boling, Britta Mccreedy, MD      I have reviewed the PDMP during this encounter.   Merrilee Jansky, MD 06/18/22 1750

## 2022-06-18 NOTE — Discharge Instructions (Addendum)
Gentle range of motion exercises Heating pad use only 20 minutes on-20 minutes off cycle If you have worsening symptoms please return to urgent care to be reevaluated There is no indication for imaging at this time.

## 2022-06-27 ENCOUNTER — Other Ambulatory Visit: Payer: Self-pay | Admitting: *Deleted

## 2022-06-27 DIAGNOSIS — N185 Chronic kidney disease, stage 5: Secondary | ICD-10-CM

## 2022-07-03 NOTE — H&P (View-Only) (Signed)
POST OPERATIVE OFFICE NOTE    CC:  F/u for surgery  HPI:  This is a 62 y.o. male who is s/p right RC AVF on 05/24/2022 by Dr. Karin Lieu.  He had a TDC placed 05/23/2022 by IR.  Pt states he does not have pain/numbness in the right hand.   He states that he does lose his hearing while on dialysis but it comes back.  He does have some pain in his shoulder since surgery.  The pt is on dialysis M/W/F at [facility] location on [address] in [facility].    Allergies  Allergen Reactions   Claritin [Loratadine] Swelling and Other (See Comments)    Joint swelling    Hydrochlorothiazide Other (See Comments)    Dizziness   Latex Hives and Swelling   Lyrica [Pregabalin] Other (See Comments)    Depression. "Makes me loopy"    Metformin And Related Diarrhea and Nausea And Vomiting    Current Outpatient Medications  Medication Sig Dispense Refill   amLODipine (NORVASC) 10 MG tablet Take 1 tablet (10 mg total) by mouth daily. 30 tablet 6   Cholecalciferol (VITAMIN D3) 1.25 MG (50000 UT) CAPS Take 50,000 Units by mouth every Friday.     famotidine (PEPCID) 10 MG tablet Take 1 tablet (10 mg total) by mouth every Monday, Wednesday, and Friday with hemodialysis. Take after hemodialysis. 12 tablet 0   finasteride (PROSCAR) 5 MG tablet Take 1 tablet (5 mg total) by mouth daily. 30 tablet 2   furosemide (LASIX) 40 MG tablet Take 40 mg by mouth 2 (two) times daily.     gabapentin (NEURONTIN) 300 MG capsule Take 1 capsule (300 mg total) by mouth daily.     hydrALAZINE (APRESOLINE) 100 MG tablet Take 100 mg by mouth 3 (three) times daily.     LANTUS SOLOSTAR 100 UNIT/ML Solostar Pen Inject 5 Units into the skin at bedtime.     metoprolol succinate (TOPROL-XL) 25 MG 24 hr tablet Take 0.5 tablets (12.5 mg total) by mouth daily. 15 tablet 2   polyethylene glycol (MIRALAX / GLYCOLAX) 17 g packet Take 17 g by mouth daily. 14 each 0   promethazine (PHENERGAN) 25 MG tablet Take 25 mg by mouth every 6 (six)  hours as needed for nausea or vomiting.     rosuvastatin (CRESTOR) 10 MG tablet Take 10 mg by mouth daily.     sodium bicarbonate 650 MG tablet Take 1,300 mg by mouth 2 (two) times daily.     tamsulosin (FLOMAX) 0.4 MG CAPS capsule Take 0.4 mg by mouth at bedtime.     traMADol (ULTRAM) 50 MG tablet Take 0.5 tablets (25 mg total) by mouth every 12 (twelve) hours as needed. 10 tablet 0   triamcinolone ointment (KENALOG) 0.5 % Apply 1 Application topically 2 (two) times daily as needed (for itching).     TYLENOL 500 MG tablet Take 500-1,000 mg by mouth every 6 (six) hours as needed for mild pain or headache.     No current facility-administered medications for this visit.     ROS:  See HPI  Physical Exam:  Today's Vitals   07/05/22 0909  BP: (!) 171/80  Pulse: 81  Resp: 20  Temp: 97.9 F (36.6 C)  TempSrc: Temporal  SpO2: 99%  Weight: 210 lb 14.4 oz (95.7 kg)  Height: 6\' 2"  (1.88 m)  PainSc: 4   PainLoc: Shoulder   Body mass index is 27.08 kg/m.   Incision:  healed nicley Extremities:  There is a palpable right radial pulse.   Motor and sensory are in tact.   There is a thrill/bruit present.  Access is  easily palpable with good thrill but is pulsatile mid way up his arm.    Dialysis Duplex on 07/05/2022: Findings:  +--------------------+----------+-----------------+--------+  AVF                PSV (cm/s)Flow Vol (mL/min)Comments  +--------------------+----------+-----------------+--------+  Native artery inflow   272           327                 +--------------------+----------+-----------------+--------+  AVF Anastomosis        229                               +--------------------+----------+-----------------+--------+   +------------+----------+-------------+----------+-------------------------  OUTFLOW VEINPSV (cm/s)Diameter (cm)Depth (cm)          Describe        +------------+----------+-------------+----------+-------------------------   Shoulder      168        0.39        0.76                                 +------------+----------+-------------+----------+-------------------------  Prox UA        115        0.44        0.62     competing branch measuring 0.14cm         +------------+----------+-------------+----------+-------------------------  Mid UA         110        0.55        0.39                             +------------+----------+-------------+----------+-------------------------  Dist UA        510        0.43        0.29    multiple competing branches, largest measuring 0.25cm     +------------+----------+-------------+----------+-------------------------  AC Fossa       487        0.51        0.49          Retained valve         +------------+----------+-------------+----------+-------------------------     Summary:  Patent arteriovenous fistula. Multiple competing branches, largest measuring 0.25cm. Retained valve noted in the outflow vein at the [facility] fossa.    Assessment/Plan:  This is a 62 y.o. male who is s/p: right RC AVF on 05/24/2022 by Dr. Karin Lieu.  He had a TDC placed 05/23/2022 by IR.  -the pt does not have evidence of steal. -pt's fistula is slow to mature with measurement of 0.39-0.43cm in the proximal and distal arm and shoulder.  He also has competing branches present.  He has low flow volume.  Will need to have fistulogram to evaluate fistula and possible intervention.  May need ligation of branches at some point in the future pending results of fistulogram.  -discussed with pt that access does not last forever and will need intervention or even new access at some point.  -we will schedule fistulogram with Dr. Karin Lieu.    Doreatha Massed, [facility] [phone]  Clinic MD:  Edilia Bo

## 2022-07-03 NOTE — Progress Notes (Unsigned)
POST OPERATIVE OFFICE NOTE    CC:  F/u for surgery  HPI:  This is a 62 y.o. male who is s/p right RC AVF on 05/24/2022 by Dr. Karin Lieu.  He had a TDC placed 05/23/2022 by IR.  Pt states he does not have pain/numbness in the right hand.   He states that he does lose his hearing while on dialysis but it comes back.  He does have some pain in his shoulder since surgery.  The pt is on dialysis M/W/F at [facility] location on [address] in [facility].    Allergies  Allergen Reactions   Claritin [Loratadine] Swelling and Other (See Comments)    Joint swelling    Hydrochlorothiazide Other (See Comments)    Dizziness   Latex Hives and Swelling   Lyrica [Pregabalin] Other (See Comments)    Depression. "Makes me loopy"    Metformin And Related Diarrhea and Nausea And Vomiting    Current Outpatient Medications  Medication Sig Dispense Refill   amLODipine (NORVASC) 10 MG tablet Take 1 tablet (10 mg total) by mouth daily. 30 tablet 6   Cholecalciferol (VITAMIN D3) 1.25 MG (50000 UT) CAPS Take 50,000 Units by mouth every Friday.     famotidine (PEPCID) 10 MG tablet Take 1 tablet (10 mg total) by mouth every Monday, Wednesday, and Friday with hemodialysis. Take after hemodialysis. 12 tablet 0   finasteride (PROSCAR) 5 MG tablet Take 1 tablet (5 mg total) by mouth daily. 30 tablet 2   furosemide (LASIX) 40 MG tablet Take 40 mg by mouth 2 (two) times daily.     gabapentin (NEURONTIN) 300 MG capsule Take 1 capsule (300 mg total) by mouth daily.     hydrALAZINE (APRESOLINE) 100 MG tablet Take 100 mg by mouth 3 (three) times daily.     LANTUS SOLOSTAR 100 UNIT/ML Solostar Pen Inject 5 Units into the skin at bedtime.     metoprolol succinate (TOPROL-XL) 25 MG 24 hr tablet Take 0.5 tablets (12.5 mg total) by mouth daily. 15 tablet 2   polyethylene glycol (MIRALAX / GLYCOLAX) 17 g packet Take 17 g by mouth daily. 14 each 0   promethazine (PHENERGAN) 25 MG tablet Take 25 mg by mouth every 6 (six)  hours as needed for nausea or vomiting.     rosuvastatin (CRESTOR) 10 MG tablet Take 10 mg by mouth daily.     sodium bicarbonate 650 MG tablet Take 1,300 mg by mouth 2 (two) times daily.     tamsulosin (FLOMAX) 0.4 MG CAPS capsule Take 0.4 mg by mouth at bedtime.     traMADol (ULTRAM) 50 MG tablet Take 0.5 tablets (25 mg total) by mouth every 12 (twelve) hours as needed. 10 tablet 0   triamcinolone ointment (KENALOG) 0.5 % Apply 1 Application topically 2 (two) times daily as needed (for itching).     TYLENOL 500 MG tablet Take 500-1,000 mg by mouth every 6 (six) hours as needed for mild pain or headache.     No current facility-administered medications for this visit.     ROS:  See HPI  Physical Exam:  Today's Vitals   07/05/22 0909  BP: (!) 171/80  Pulse: 81  Resp: 20  Temp: 97.9 F (36.6 C)  TempSrc: Temporal  SpO2: 99%  Weight: 210 lb 14.4 oz (95.7 kg)  Height: 6\' 2"  (1.88 m)  PainSc: 4   PainLoc: Shoulder   Body mass index is 27.08 kg/m.   Incision:  healed nicley Extremities:  There is a palpable right radial pulse.   Motor and sensory are in tact.   There is a thrill/bruit present.  Access is  easily palpable with good thrill but is pulsatile mid way up his arm.    Dialysis Duplex on 07/05/2022: Findings:  +--------------------+----------+-----------------+--------+  AVF                PSV (cm/s)Flow Vol (mL/min)Comments  +--------------------+----------+-----------------+--------+  Native artery inflow   272           327                 +--------------------+----------+-----------------+--------+  AVF Anastomosis        229                               +--------------------+----------+-----------------+--------+   +------------+----------+-------------+----------+-------------------------  OUTFLOW VEINPSV (cm/s)Diameter (cm)Depth (cm)          Describe        +------------+----------+-------------+----------+-------------------------   Shoulder      168        0.39        0.76                                 +------------+----------+-------------+----------+-------------------------  Prox UA        115        0.44        0.62     competing branch measuring 0.14cm         +------------+----------+-------------+----------+-------------------------  Mid UA         110        0.55        0.39                             +------------+----------+-------------+----------+-------------------------  Dist UA        510        0.43        0.29    multiple competing branches, largest measuring 0.25cm     +------------+----------+-------------+----------+-------------------------  AC Fossa       487        0.51        0.49          Retained valve         +------------+----------+-------------+----------+-------------------------     Summary:  Patent arteriovenous fistula. Multiple competing branches, largest measuring 0.25cm. Retained valve noted in the outflow vein at the [facility] fossa.    Assessment/Plan:  This is a 62 y.o. male who is s/p: right RC AVF on 05/24/2022 by Dr. Karin Lieu.  He had a TDC placed 05/23/2022 by IR.  -the pt does not have evidence of steal. -pt's fistula is slow to mature with measurement of 0.39-0.43cm in the proximal and distal arm and shoulder.  He also has competing branches present.  He has low flow volume.  Will need to have fistulogram to evaluate fistula and possible intervention.  May need ligation of branches at some point in the future pending results of fistulogram.  -discussed with pt that access does not last forever and will need intervention or even new access at some point.  -we will schedule fistulogram with Dr. Karin Lieu.    Doreatha Massed, [facility] [phone]  Clinic MD:  Edilia Bo

## 2022-07-05 ENCOUNTER — Encounter: Payer: Self-pay | Admitting: Physician Assistant

## 2022-07-05 ENCOUNTER — Ambulatory Visit (INDEPENDENT_AMBULATORY_CARE_PROVIDER_SITE_OTHER): Payer: Medicaid Other | Admitting: Physician Assistant

## 2022-07-05 ENCOUNTER — Ambulatory Visit (HOSPITAL_COMMUNITY)
Admission: RE | Admit: 2022-07-05 | Discharge: 2022-07-05 | Disposition: A | Discharge status: Home / Self Care | Payer: Medicare Other | Source: Ambulatory Visit | Attending: Vascular Surgery | Admitting: Vascular Surgery

## 2022-07-05 VITALS — BP 171/80 | HR 81 | Temp 97.9°F | Resp 20 | Ht 74.0 in | Wt 210.9 lb

## 2022-07-05 DIAGNOSIS — N185 Chronic kidney disease, stage 5: Secondary | ICD-10-CM | POA: Diagnosis not present

## 2022-07-05 DIAGNOSIS — Z992 Dependence on renal dialysis: Secondary | ICD-10-CM

## 2022-07-05 DIAGNOSIS — N186 End stage renal disease: Secondary | ICD-10-CM

## 2022-07-09 ENCOUNTER — Telehealth: Payer: Self-pay

## 2022-07-09 NOTE — Telephone Encounter (Signed)
Attempted to reach patient to schedule fistulogram. Left VM for patient to return call.  

## 2022-07-10 ENCOUNTER — Other Ambulatory Visit: Payer: Self-pay

## 2022-07-10 DIAGNOSIS — N186 End stage renal disease: Secondary | ICD-10-CM

## 2022-07-10 MED ORDER — SODIUM CHLORIDE 0.9 % IV SOLN
250.0000 mL | INTRAVENOUS | Status: DC | PRN
Start: 2022-07-10 — End: 2023-01-27

## 2022-07-10 MED ORDER — SODIUM CHLORIDE 0.9% FLUSH
3.0000 mL | Freq: Two times a day (BID) | INTRAVENOUS | Status: DC
Start: 2022-07-10 — End: 2023-01-27

## 2022-07-10 NOTE — Telephone Encounter (Signed)
Spoke with patient and procedure scheduled for 7/2. Instructions provided and he voiced understanding.

## 2022-07-17 ENCOUNTER — Ambulatory Visit (HOSPITAL_COMMUNITY)
Admission: RE | Admit: 2022-07-17 | Discharge: 2022-07-17 | Disposition: A | Discharge status: Home / Self Care | Payer: Medicare Other | Source: Ambulatory Visit | Attending: Surgery | Admitting: Surgery

## 2022-07-17 ENCOUNTER — Encounter (HOSPITAL_COMMUNITY)
Admission: RE | Disposition: A | Discharge status: Home / Self Care | Payer: Self-pay | Source: Ambulatory Visit | Attending: Surgery

## 2022-07-17 DIAGNOSIS — Z992 Dependence on renal dialysis: Secondary | ICD-10-CM | POA: Insufficient documentation

## 2022-07-17 DIAGNOSIS — T82898A Other specified complication of vascular prosthetic devices, implants and grafts, initial encounter: Secondary | ICD-10-CM | POA: Insufficient documentation

## 2022-07-17 DIAGNOSIS — Y832 Surgical operation with anastomosis, bypass or graft as the cause of abnormal reaction of the patient, or of later complication, without mention of misadventure at the time of the procedure: Secondary | ICD-10-CM | POA: Diagnosis not present

## 2022-07-17 DIAGNOSIS — N185 Chronic kidney disease, stage 5: Secondary | ICD-10-CM | POA: Diagnosis not present

## 2022-07-17 DIAGNOSIS — N186 End stage renal disease: Secondary | ICD-10-CM | POA: Insufficient documentation

## 2022-07-17 HISTORY — PX: A/V FISTULAGRAM: CATH118298

## 2022-07-17 LAB — POCT I-STAT, CHEM 8
BUN: 38 mg/dL — ABNORMAL HIGH (ref 8–23)
Calcium, Ion: 1.1 mmol/L — ABNORMAL LOW (ref 1.15–1.40)
Chloride: 105 mmol/L (ref 98–111)
Creatinine, Ser: 6 mg/dL — ABNORMAL HIGH (ref 0.61–1.24)
Glucose, Bld: 132 mg/dL — ABNORMAL HIGH (ref 70–99)
HCT: 43 % (ref 39.0–52.0)
Hemoglobin: 14.6 g/dL (ref 13.0–17.0)
Potassium: 4.3 mmol/L (ref 3.5–5.1)
Sodium: 139 mmol/L (ref 135–145)
TCO2: 25 mmol/L (ref 22–32)

## 2022-07-17 LAB — GLUCOSE, CAPILLARY: Glucose-Capillary: 109 mg/dL — ABNORMAL HIGH (ref 70–99)

## 2022-07-17 IMAGING — DX DG CHEST 1V PORT
1 series · 1 of 1 positions shown · non-contrast
Comparison: Chest radiograph 02/02/2021.

CLINICAL DATA: History of pneumonia.

EXAM:
PORTABLE CHEST 1 VIEW

[chest ap]
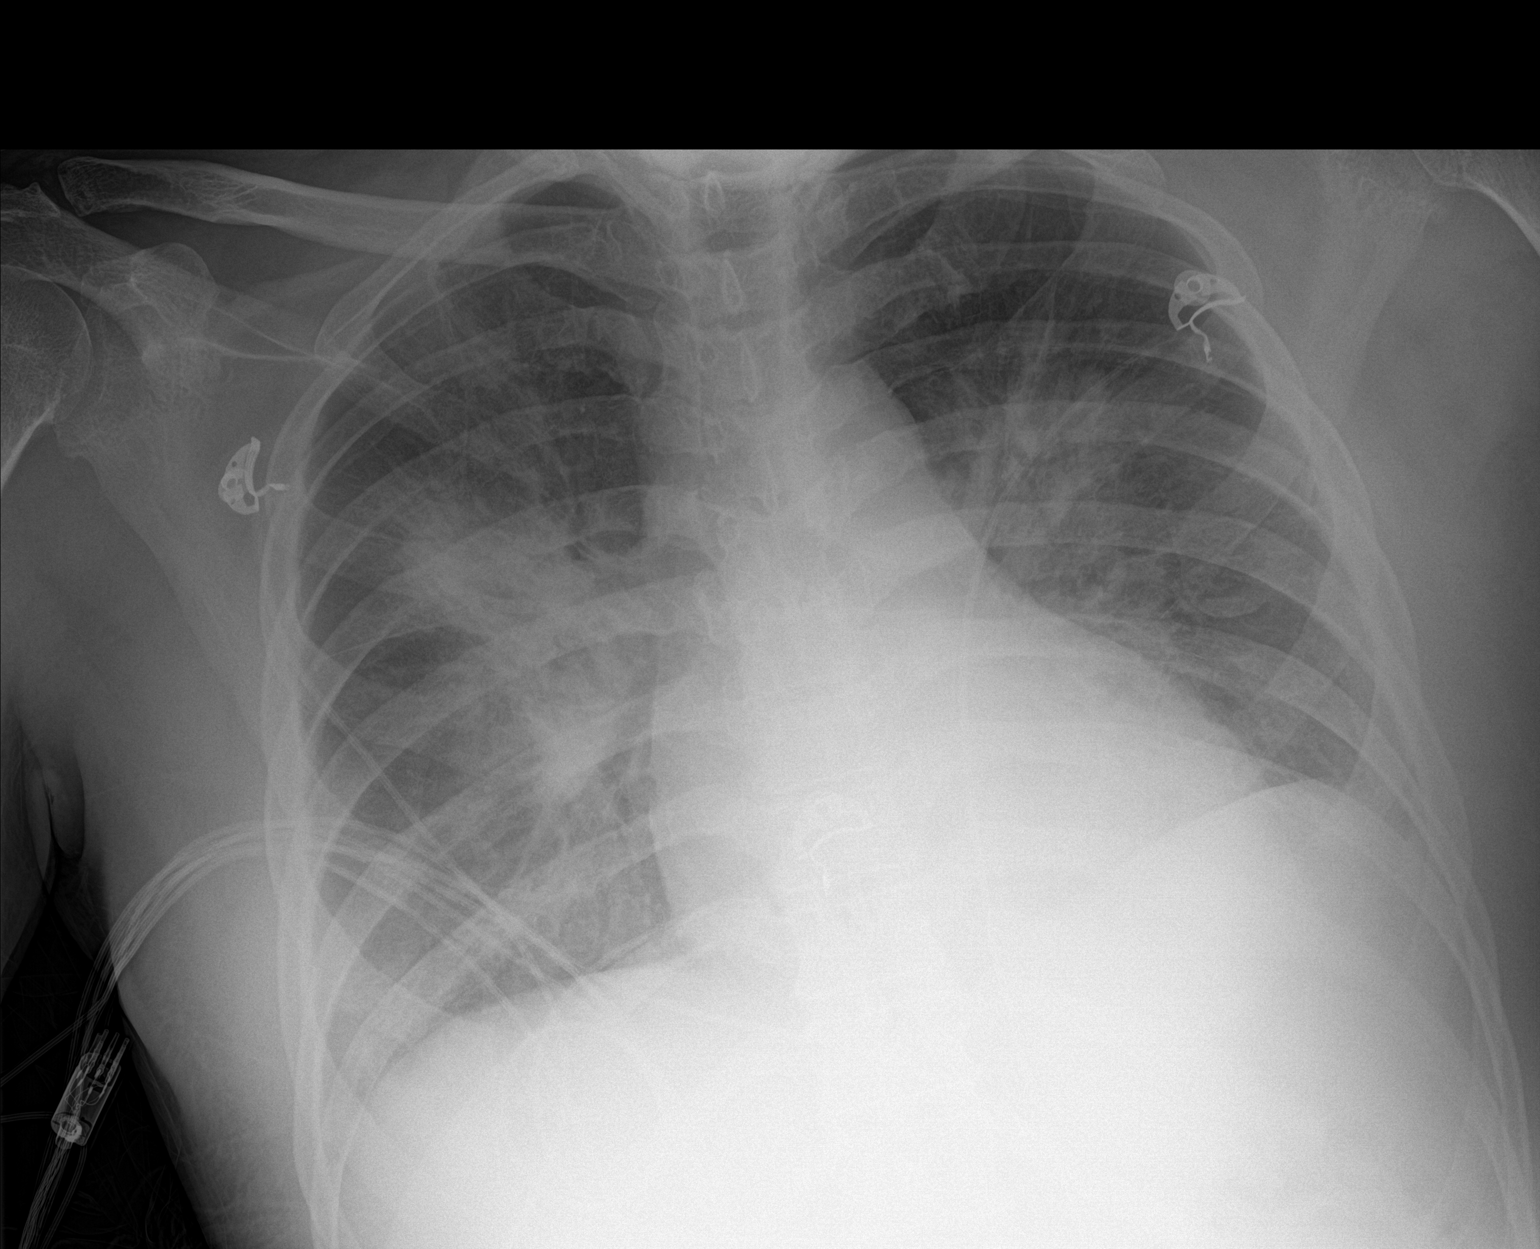

[1 of 1 positions shown; findings below may reference images not displayed]

FINDINGS: Monitoring leads overlie the patient. Stable cardiac and mediastinal
contours. Slight interval increase in right-greater-than-left
midlung consolidative opacities. No pleural effusion or
pneumothorax.
IMPRESSION: Interval increase in right greater than left predominantly perihilar
consolidation which may represent pneumonia in the appropriate
clinical setting. Followup PA and lateral chest X-ray is recommended
in 3-4 weeks following trial of antibiotic therapy to ensure
resolution and exclude underlying malignancy.

## 2022-07-17 SURGERY — A/V FISTULAGRAM
Anesthesia: LOCAL | Laterality: Right

## 2022-07-17 MED ORDER — LIDOCAINE HCL (PF) 1 % IJ SOLN
INTRAMUSCULAR | Status: AC
  Filled 2022-07-17: qty 30

## 2022-07-17 MED ORDER — LIDOCAINE HCL (PF) 1 % IJ SOLN
INTRAMUSCULAR | Status: DC | PRN
  Administered 2022-07-17: 10 mL

## 2022-07-17 MED ORDER — LABETALOL HCL 5 MG/ML IV SOLN
INTRAVENOUS | Status: DC | PRN
  Administered 2022-07-17: 10 mg via INTRAVENOUS

## 2022-07-17 MED ORDER — LABETALOL HCL 5 MG/ML IV SOLN
INTRAVENOUS | Status: AC
  Filled 2022-07-17: qty 4

## 2022-07-17 MED ORDER — HEPARIN (PORCINE) IN NACL 1000-0.9 UT/500ML-% IV SOLN
INTRAVENOUS | Status: DC | PRN
  Administered 2022-07-17: 500 mL

## 2022-07-17 MED ORDER — SODIUM CHLORIDE 0.9% FLUSH: 3.0000 mL | INTRAVENOUS | Status: DC | PRN

## 2022-07-17 MED ORDER — FENTANYL CITRATE (PF) 100 MCG/2ML IJ SOLN
INTRAMUSCULAR | Status: AC
  Filled 2022-07-17: qty 2

## 2022-07-17 MED ORDER — MIDAZOLAM HCL 2 MG/2ML IJ SOLN
INTRAMUSCULAR | Status: DC | PRN
  Administered 2022-07-17: 1 mg via INTRAVENOUS

## 2022-07-17 MED ORDER — FENTANYL CITRATE (PF) 100 MCG/2ML IJ SOLN
INTRAMUSCULAR | Status: DC | PRN
  Administered 2022-07-17: 50 ug via INTRAVENOUS

## 2022-07-17 MED ORDER — MIDAZOLAM HCL 2 MG/2ML IJ SOLN
INTRAMUSCULAR | Status: AC
  Filled 2022-07-17: qty 2

## 2022-07-17 SURGICAL SUPPLY — 8 items
COVER DOME SNAP 22 D (MISCELLANEOUS) ×1 IMPLANT
KIT MICROPUNCTURE NIT STIFF (SHEATH) IMPLANT
PROTECTION STATION PRESSURIZED (MISCELLANEOUS) ×1
SHEATH PROBE COVER 6X72 (BAG) ×1 IMPLANT
STATION PROTECTION PRESSURIZED (MISCELLANEOUS) ×1 IMPLANT
STOPCOCK MORSE 400PSI 3WAY (MISCELLANEOUS) ×1 IMPLANT
TRAY PV CATH (CUSTOM PROCEDURE TRAY) ×1 IMPLANT
TUBING CIL FLEX 10 FLL-RA (TUBING) ×1 IMPLANT

## 2022-07-17 NOTE — Interval H&P Note (Signed)
History and Physical Interval Note:  07/17/2022 4:14 PM  [name]  has presented today for surgery, with the diagnosis of instage renal.  The various methods of treatment have been discussed with the patient and family. After consideration of risks, benefits and other options for treatment, the patient has consented to  Procedure(s): A/V Fistulagram (Right) as a surgical intervention.  The patient's history has been reviewed, patient examined, no change in status, stable for surgery.  I have reviewed the patient's chart and labs.  Questions were answered to the patient's satisfaction.     Ryan Jenkins

## 2022-07-17 NOTE — Op Note (Signed)
   PATIENT: Ryan Jenkins      MRN: [id number] DOB: Jun 04, 1960    DATE OF PROCEDURE: 07/17/2022  INDICATIONS:    Ryan Jenkins is a 62 y.o. male who had a right radiocephalic fistula placed by Dr. Karin Lieu on 05/24/2022.  The patient has a high bifurcation of the brachial artery and the patient was noted to have a 3 mm radial artery at the antecubital fossa and a 4 mm cephalic vein.  He dialyzes on Monday Wednesdays and Fridays.  He was seen in the office on 07/05/2022.  The fistula was slow to mature.  Diameters range from 3.9-4.3 mm.  He had multiple competing branches.  He also had an area in the proximal fistula with a retained valve.  He presents for a fistulogram and possible intervention  PROCEDURE:    Conscious sedation Ultrasound-guided access to the right radiocephalic AV fistula Fistulogram right radiocephalic fistula  SURGEON: Di Kindle. Edilia Bo, MD, FACS  ANESTHESIA: Local with sedation  EBL: Minimal  TECHNIQUE: The patient was brought to the peripheral vascular lab and was sedated. The period of conscious sedation was 20 minutes.  During that time period, I was present face-to-face 100% of the time.  The patient was administered 1 mg of Versed and 50 mcg of fentanyl. The patient's heart rate, blood pressure, and oxygen saturation were monitored by the nurse continuously during the procedure.  The right arm was prepped and draped in usual sterile fashion.  Under ultrasound guidance, after the skin was anesthetized, I cannulated the proximal fistula with a micropuncture needle and a micropuncture sheath was introduced over a wire.  A fistulogram was then obtained to evaluate the fistula from the point of cannulation to include the central veins.  I then compressed distal to the cannulation site for reflux shot.  At completion site was closed with a 4-0 Monocryl.  There was good hemostasis.  FINDINGS:   There are multiple competing branches in his right upper arm radiocephalic  fistula. The vein in general is fairly small. There is an area of stenosis just proximal to the largest of the competing branches above the antecubital fossa. There is no central venous stenosis. No significant stenosis at the anastomosis is noted.  CLINICAL NOTE: This patient will be scheduled for ligation of multiple competing branches and possible patch angioplasty of the area of stenosis in the proximal fistula.  He dialyzes on Monday Wednesdays and Fridays so this will be done on a Tuesday or Thursday.  Waverly Ferrari, MD, FACS [facility]  DATE OF DICTATION:   07/17/2022

## 2022-07-17 NOTE — Interval H&P Note (Signed)
History and Physical Interval Note:  07/17/2022 1:05 PM  Ryan Jenkins  has presented today for surgery, with the diagnosis of instage renal.  The various methods of treatment have been discussed with the patient and family. After consideration of risks, benefits and other options for treatment, the patient has consented to  Procedure(s): A/V Fistulagram (Right) as a surgical intervention.  The patient's history has been reviewed, patient examined, no change in status, stable for surgery.  I have reviewed the patient's chart and labs.  Questions were answered to the patient's satisfaction.     Durene Cal

## 2022-07-18 ENCOUNTER — Encounter (HOSPITAL_COMMUNITY): Payer: Self-pay | Admitting: Vascular Surgery

## 2022-07-18 IMAGING — US US RENAL
1 series · 15 of 25 positions shown · non-contrast
Comparison: None.

CLINICAL DATA: Acute on chronic renal failure

EXAM:
RENAL / URINARY TRACT ULTRASOUND COMPLETE

[Series 1: us renal mc & wl · 15 of 32 slices shown]
[im 1/32]
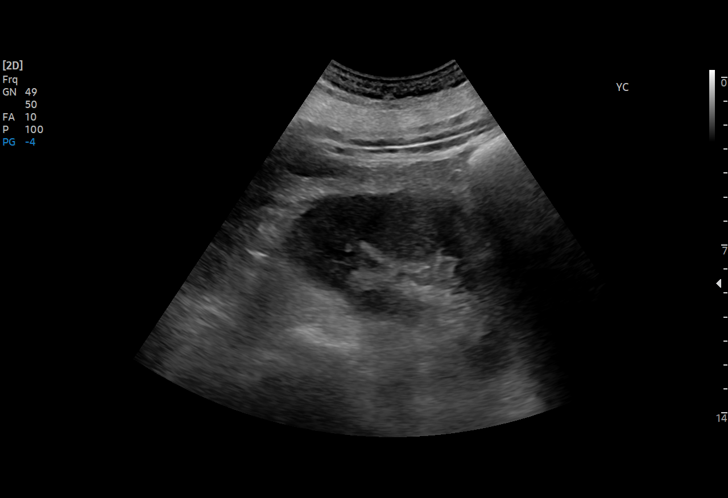
[im 3/32]
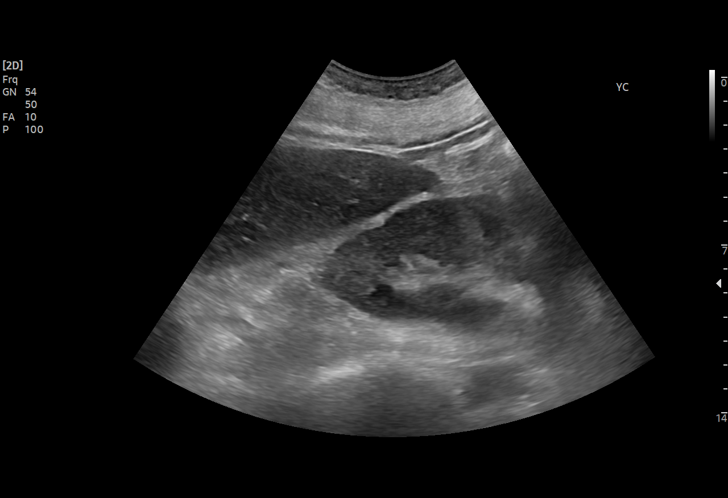
[im 6/32]
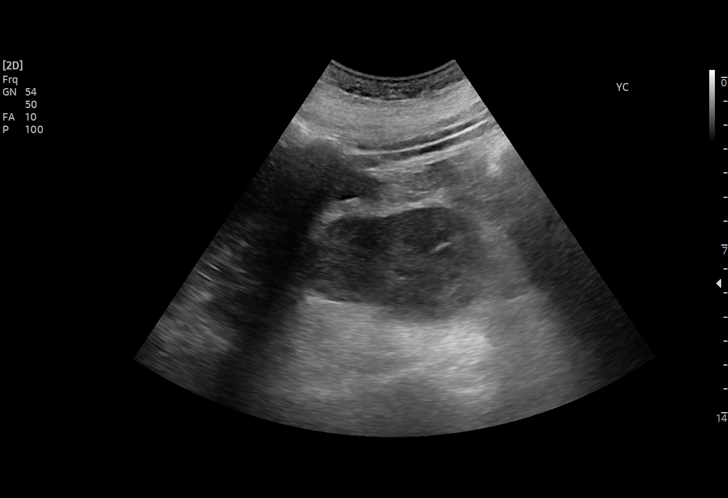
[im 7/32]
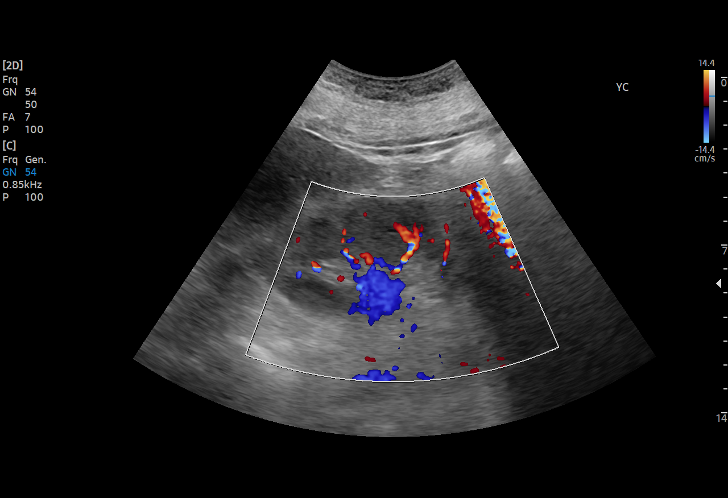
[im 10/32]
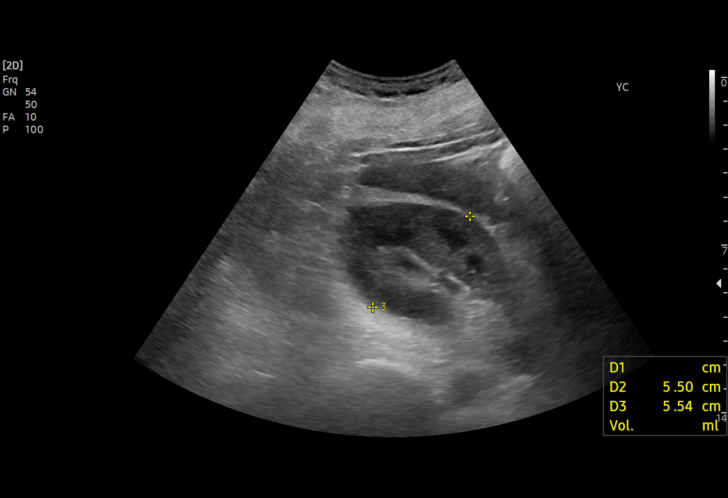
[im 12/32]
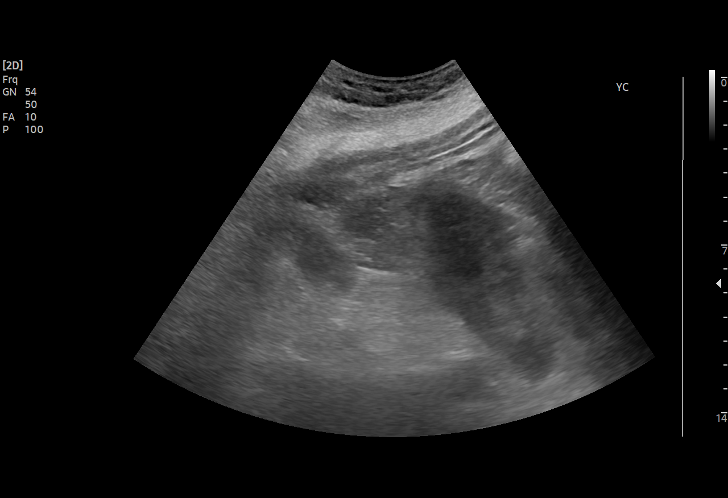
[im 13/32]
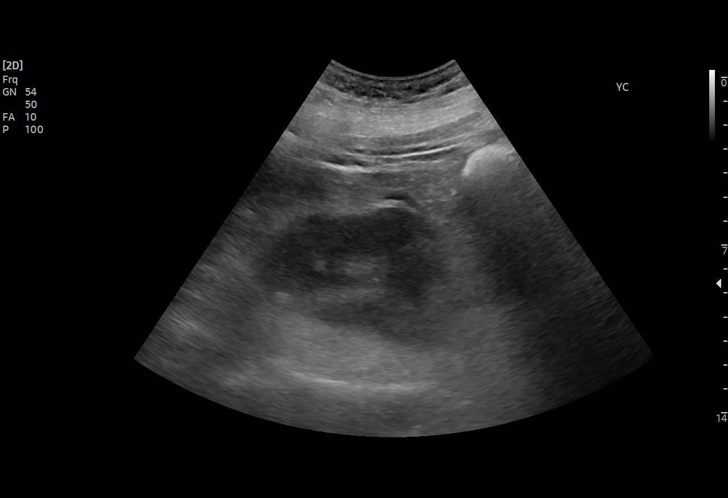
[im 16/32]
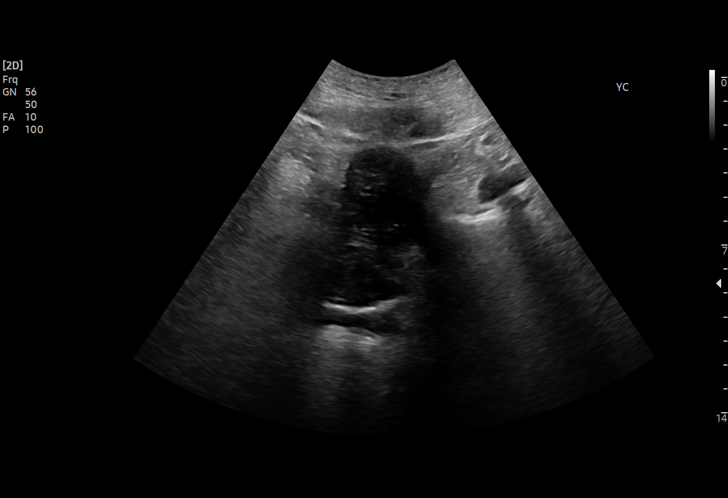
[im 19/32]
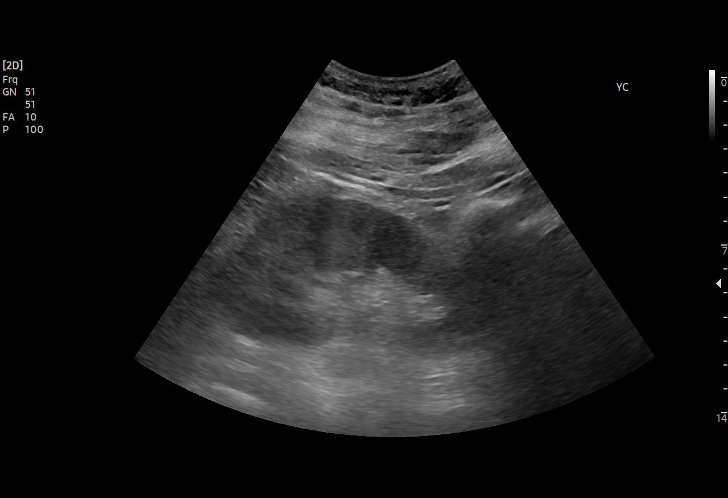
[im 20/32]
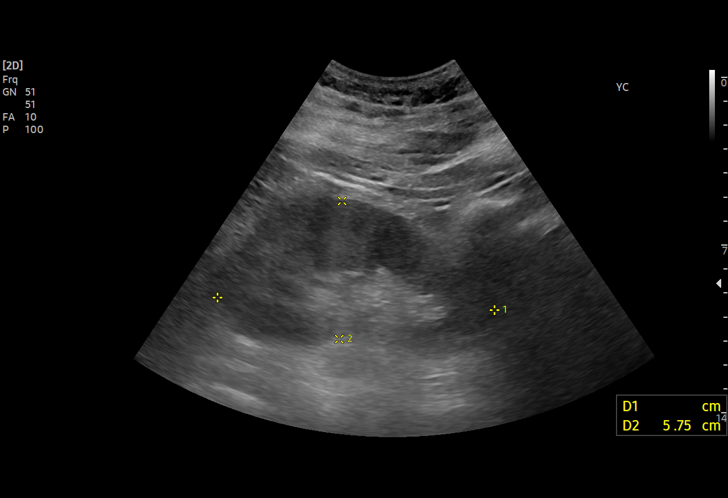
[im 22/32]
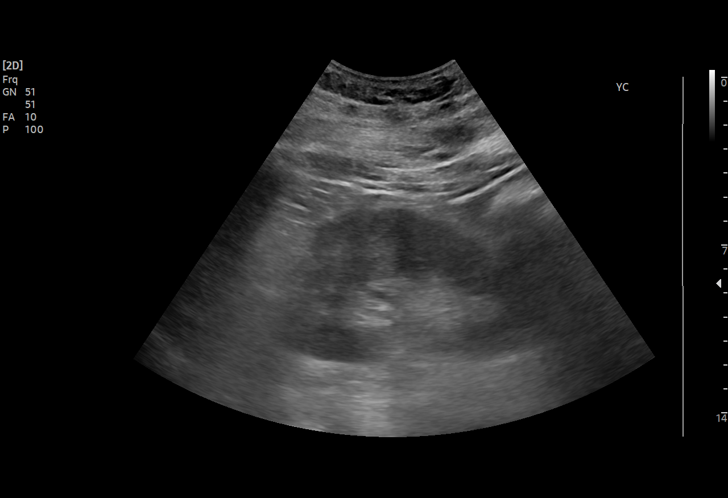
[im 25/32]
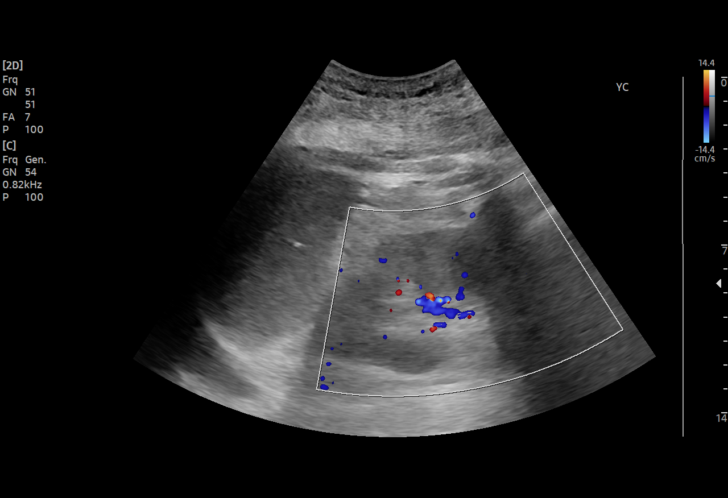
[im 26/32]
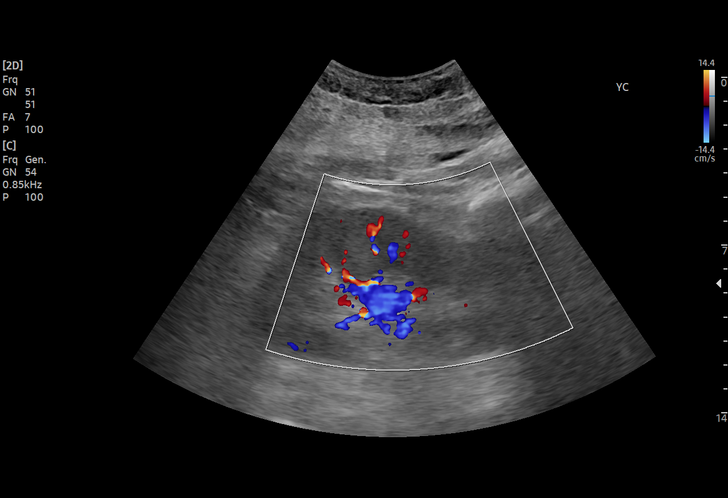
[im 29/32]
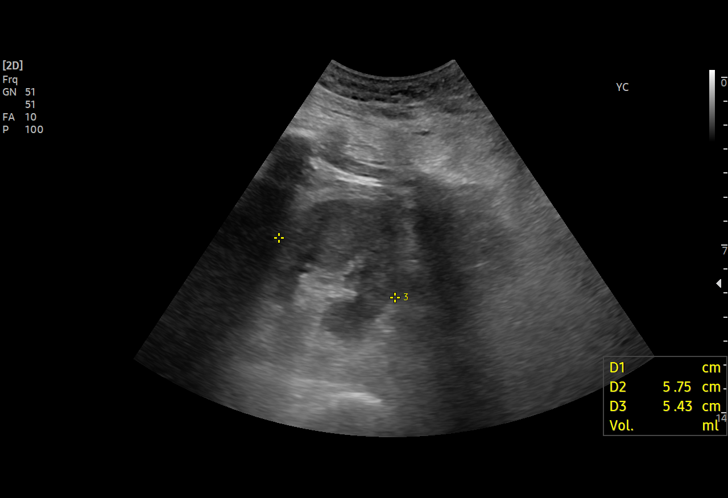
[im 32/32]
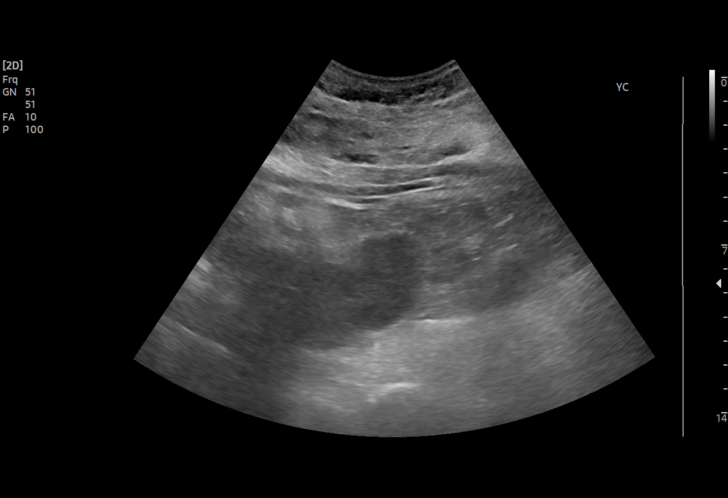

[15 of 25 positions shown; findings below may reference images not displayed]

FINDINGS: Right Kidney:

Renal measurements: 11.2 x 5.5 x 5.5 cm = volume: 178 mL.
Echogenicity within normal limits. No mass or hydronephrosis
visualized.

Left Kidney:

Renal measurements: 11.5 x 5.8 x 5.4 cm = volume: 189 mL.
Echogenicity within normal limits. No mass or hydronephrosis
visualized.

Bladder:

Decompressed by Foley catheter.

Other:

None.
IMPRESSION: No acute abnormality detected.  No hydronephrosis.

## 2022-07-20 IMAGING — DX DG CHEST 1V PORT
1 series · 1 of 1 positions shown · non-contrast
Comparison: 12/07/2021

CLINICAL DATA: Pneumonia.  Follow-up exam.

EXAM:
PORTABLE CHEST 1 VIEW

[chest ap]
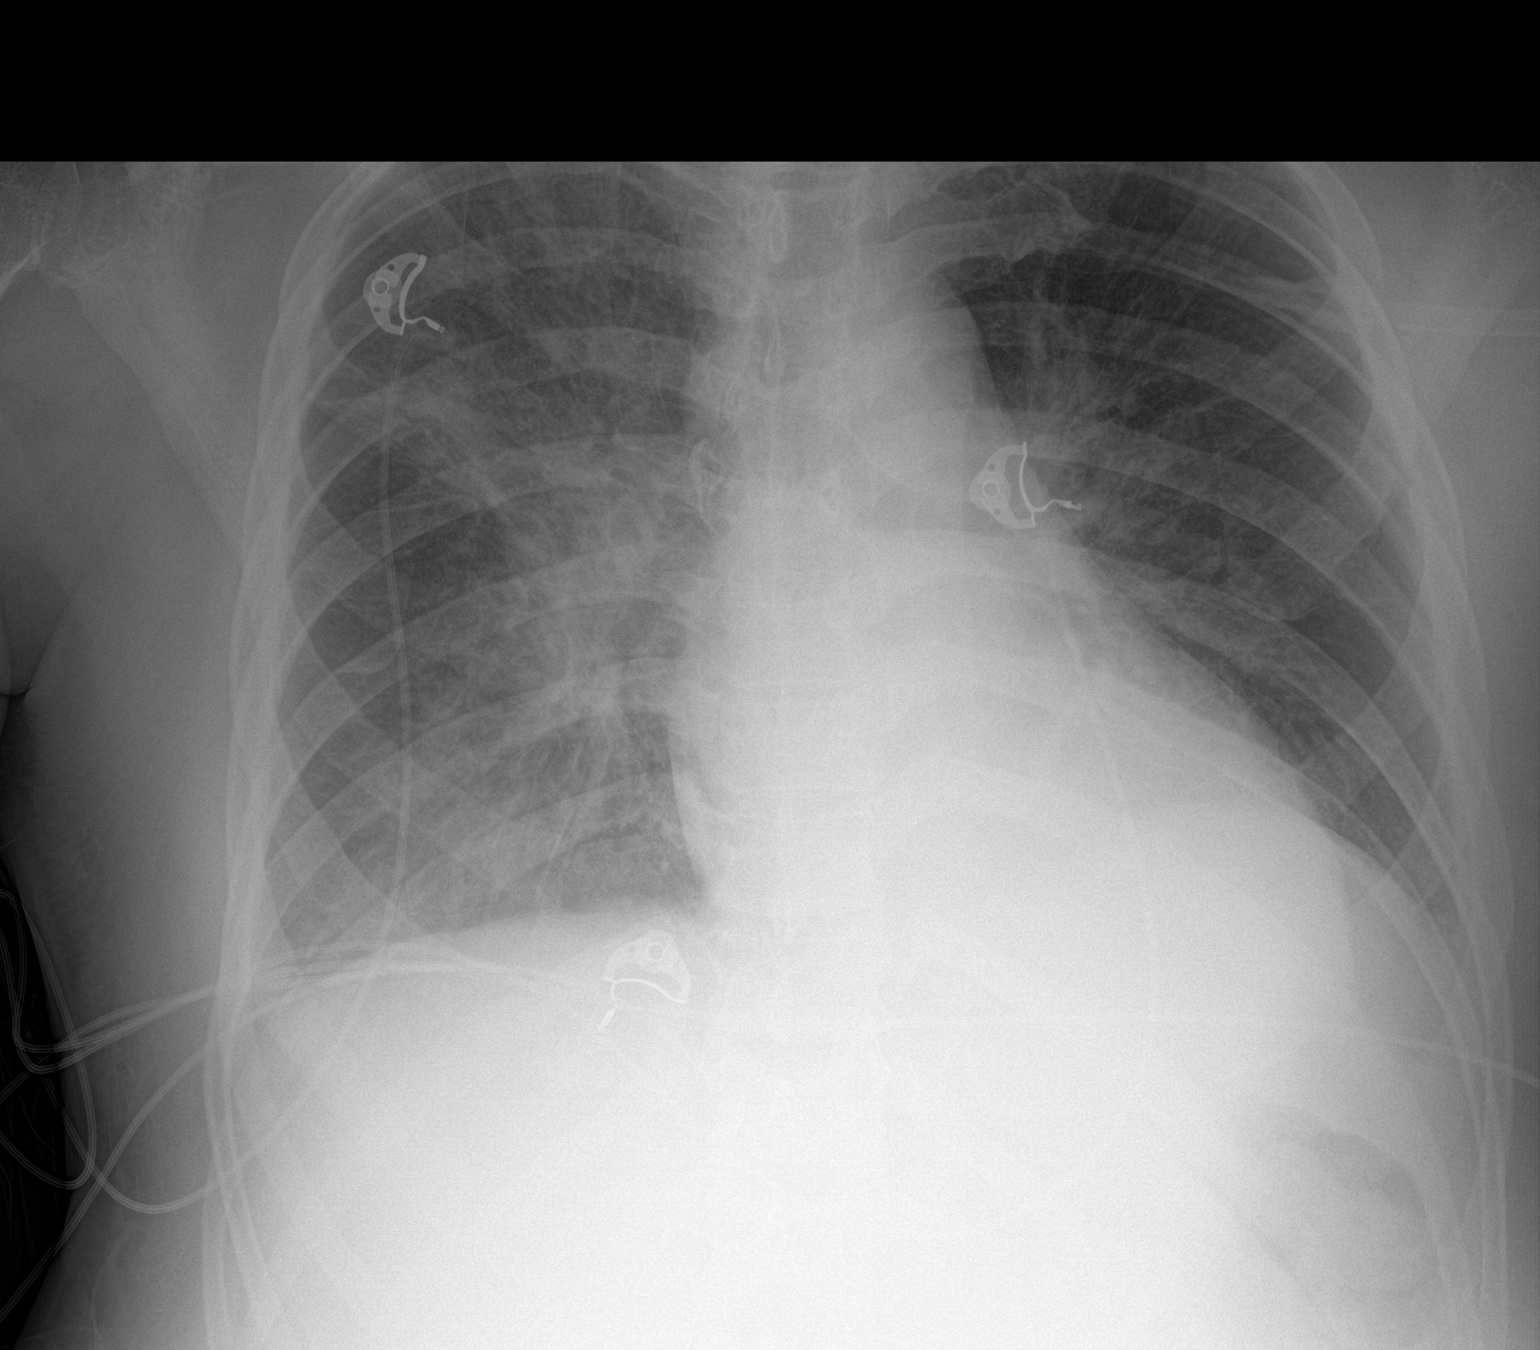

[1 of 1 positions shown; findings below may reference images not displayed]

FINDINGS: Persistent airspace disease in the right lung particularly in the
right perihilar region. Aeration at the left lung base has slightly
improved. Heart size is stable. Trachea is midline. Negative for a
pneumothorax.
IMPRESSION: 1. Improved aeration at the left lung base.
2. No significant change in the parenchymal disease / airspace
disease in the right lung.

## 2022-08-06 ENCOUNTER — Other Ambulatory Visit: Payer: Self-pay

## 2022-08-17 ENCOUNTER — Other Ambulatory Visit: Payer: Self-pay | Admitting: *Deleted

## 2022-08-17 ENCOUNTER — Encounter: Payer: Self-pay | Admitting: *Deleted

## 2022-08-17 DIAGNOSIS — N186 End stage renal disease: Secondary | ICD-10-CM

## 2022-08-23 ENCOUNTER — Telehealth: Payer: Self-pay

## 2022-08-23 NOTE — Telephone Encounter (Signed)
Received notification from [facility] preadmission testing nurse that patient is cancelling surgery on tomorrow because of a "real bad yeast infection" and has a MD appt on tomorrow. Attempted to reach patient to reschedule surgery, but no answer. Left VM to return call to office.

## 2022-08-23 NOTE — Progress Notes (Signed)
Ryan Jenkins  states that he will not be having surgery, "I have a real bad infection," patient reported.I asked patient what symptoms he has, "I have a yeast infection, I have an appoint with my Dr. in the morning," patient said. I asked patient if he had called Dr. Edilia Bo, he had not.  I left a message on the nurse's line with the information Ryan Jenkins gave me.

## 2022-08-24 ENCOUNTER — Ambulatory Visit (HOSPITAL_COMMUNITY): Admission: RE | Admit: 2022-08-24 | Payer: Medicare Other | Source: Ambulatory Visit | Admitting: Vascular Surgery

## 2022-08-24 SURGERY — REVISON OF ARTERIOVENOUS FISTULA
Anesthesia: Choice | Laterality: Right

## 2022-08-24 NOTE — Telephone Encounter (Signed)
Contacted patient to follow up on rescheduling surgery. Patient stated he cannot schedule surgery at this time, but will call office back when ready. Provided patient with contact information.

## 2022-09-06 ENCOUNTER — Other Ambulatory Visit: Payer: Self-pay

## 2022-09-06 DIAGNOSIS — N186 End stage renal disease: Secondary | ICD-10-CM

## 2022-09-14 ENCOUNTER — Other Ambulatory Visit: Payer: Self-pay

## 2022-09-14 ENCOUNTER — Encounter (HOSPITAL_COMMUNITY): Payer: Self-pay | Admitting: Vascular Surgery

## 2022-09-14 NOTE — Progress Notes (Signed)
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  He is status post right RC AVF on 05/24/2022 by Dr. Karin Lieu.  History of cardiac cath 10/2019 showing 60% stenosis in nondominant proximal RCA, no major blockages in the rest of the coronary arteries.  Follow-up stress test on 05/30/2021 was negative for ischemia/infarction.  Echo 12/23/2021 showed EF 50 to 55%, grade 2 DD, RV systolic function mildly reduced, no significant valvular abnormalities.  Patient will need day of surgery labs and evaluation.  EKG 05/28/2022: Normal sinus rhythm.  Rate 89. T wave abnormality, consider lateral ischemia.  No significant change.  TTE 12/23/2021:  1. Left ventricular ejection fraction, by estimation, is 50 to 55%. The  left ventricle has low normal function. The left ventricle demonstrates  global hypokinesis. There is moderate concentric left ventricular  hypertrophy. Left ventricular diastolic  parameters are consistent with Grade II diastolic dysfunction  (pseudonormalization).   2. Right ventricular systolic function is mildly reduced. The right  ventricular size is mildly enlarged. Tricuspid regurgitation signal is  inadequate for assessing PA pressure.   3. Left atrial size was mildly dilated.   4. The mitral valve is grossly normal. Trivial mitral valve  regurgitation. No evidence of mitral stenosis.   5. The aortic valve is tricuspid. Aortic valve regurgitation is not  visualized. Aortic valve sclerosis is present, with no evidence of aortic  valve stenosis.   6. The inferior vena cava is dilated in size with >50% respiratory  variability, suggesting right atrial pressure of 8 mmHg.   Nuclear stress 05/29/2021 (Care Everywhere): Impression:   1. No chest pain with stress.  2. No ST changes to  suggest ischemia.  3. Normal LV systolic function and wall motion.  4. No ischemia by perfusion imaging.  5. Low risk study.     Zannie Cove [facility] Short Stay Center/Anesthesiology Phone [phone] 09/14/2022 10:05 AM

## 2022-09-14 NOTE — Anesthesia Preprocedure Evaluation (Signed)
Anesthesia Evaluation  Patient identified by MRN, date of birth, ID band Patient awake    Reviewed: Allergy & Precautions, NPO status , Patient's Chart, lab work & pertinent test results, reviewed documented beta blocker date and time   Airway Mallampati: III  TM Distance: >3 FB Neck ROM: Full    Dental no notable dental hx.    Pulmonary neg pulmonary ROS   Pulmonary exam normal        Cardiovascular hypertension, Pt. on medications and Pt. on home beta blockers + CAD and +CHF  Normal cardiovascular exam+ dysrhythmias  Rhythm:Regular Rate:Normal     Neuro/Psych   Anxiety Depression    negative neurological ROS     GI/Hepatic Neg liver ROS, PUD,GERD  ,,  Endo/Other  diabetes, Type 2, Insulin Dependent    Renal/GU CRFRenal disease  negative genitourinary   Musculoskeletal  (+) Arthritis , Osteoarthritis,    Abdominal Normal abdominal exam  (+)   Peds  Hematology  (+) Blood dyscrasia, anemia Lab Results      Component                Value               Date                      WBC                      5.7                 05/24/2022                HGB                      9.5 (L)             05/24/2022                HCT                      27.4 (L)            05/24/2022                MCV                      87.3                05/24/2022                PLT                      269                 05/24/2022             Lab Results      Component                Value               Date                      NA                       136                 05/24/2022  K                        4.1                 05/24/2022                CO2                      21 (L)              05/24/2022                GLUCOSE                  117 (H)             05/24/2022                BUN                      84 (H)              05/24/2022                CREATININE               6.49 (H)            05/24/2022                 CALCIUM                  8.2 (L)             05/24/2022                EGFR                     32 (L)              07/13/2020                GFRNONAA                 9 (L)               05/24/2022              Anesthesia Other Findings   Reproductive/Obstetrics                              Anesthesia Physical Anesthesia Plan  ASA: 3  Anesthesia Plan: MAC and Regional   Post-op Pain Management: Regional block*   Induction: Intravenous  PONV Risk Score and Plan: 1 and Ondansetron, Propofol infusion, Midazolam and Treatment may vary due to age or medical condition  Airway Management Planned: Simple Face Mask and Nasal Cannula  Additional Equipment: None  Intra-op Plan:   Post-operative Plan:   Informed Consent: I have reviewed the patients History and Physical, chart, labs and discussed the procedure including the risks, benefits and alternatives for the proposed anesthesia with the patient or authorized representative who has indicated his/her understanding and acceptance.     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  He is  status post right RC AVF on 05/24/2022 by Dr. Karin Lieu.  History of cardiac cath 10/2019 showing 60% stenosis in nondominant proximal RCA, no major blockages in the rest of the coronary arteries.  Follow-up stress test on 05/30/2021 was negative for ischemia/infarction.  Echo 12/23/2021 showed EF 50 to 55%, grade 2 DD, RV systolic function mildly reduced, no significant valvular abnormalities.  Patient will need day of surgery labs and evaluation.  EKG 05/28/2022: Normal sinus rhythm.  Rate 89. T wave abnormality, consider lateral ischemia.  No  significant change.  TTE 12/23/2021:  1. Left ventricular ejection fraction, by estimation, is 50 to 55%. The  left ventricle has low normal function. The left ventricle demonstrates  global hypokinesis. There is moderate concentric left ventricular  hypertrophy. Left ventricular diastolic  parameters are consistent with Grade II diastolic dysfunction  (pseudonormalization).   2. Right ventricular systolic function is mildly reduced. The right  ventricular size is mildly enlarged. Tricuspid regurgitation signal is  inadequate for assessing PA pressure.   3. Left atrial size was mildly dilated.   4. The mitral valve is grossly normal. Trivial mitral valve  regurgitation. No evidence of mitral stenosis.   5. The aortic valve is tricuspid. Aortic valve regurgitation is not  visualized. Aortic valve sclerosis is present, with no evidence of aortic  valve stenosis.   6. The inferior vena cava is dilated in size with >50% respiratory  variability, suggesting right atrial pressure of 8 mmHg.   Nuclear stress 05/29/2021 (Care Everywhere): Impression:   1. No chest pain with stress.  2. No ST changes to suggest ischemia.  3. Normal LV systolic function and wall motion.  4. No ischemia by perfusion imaging.  5. Low risk study.    )        Anesthesia Quick Evaluation

## 2022-09-14 NOTE — Progress Notes (Signed)
PCP - Nyra Jabs Cardiologist - Madan Badal  PPM/ICD - Denies Device Orders - n/a Rep Notified - n/a  Chest x-ray - 05-28-22 EKG - 05-15-22 ECHO - 12-23-21 Cardiac Cath - 11-06-19  CPAP - Denies  GLP-1 -Denies  Fasting Blood Sugar - 90's per patient Checks Blood Sugar BID  Blood Thinner Instructions: Denies Aspirin Instructions: n/a  ERAS Protcol - NPO  COVID TEST- n/a  Anesthesia review: Yes Cardiac history  Patient verbally denies any shortness of breath, fever, cough and chest pain during phone call   -------------  SDW INSTRUCTIONS given:  Your procedure is scheduled on Sept.3,2024.  Report to [facility] Main Entrance "A" at 7:40 A.M., and check in at the Admitting office.  Call this number if you have problems the morning of surgery:  [phone]   Remember:  Do not eat or drink after midnight the night before your surgery     Take these medicines the morning of surgery with A SIP OF WATER  amLODipine (NORVASC)  finasteride (PROSCAR)  metoprolol succinate (TOPROL-XL)  rosuvastatin (CRESTOR)  tamsulosin (FLOMAX)   IF needed TYLENOL   As of today, STOP taking any Aspirin (unless otherwise instructed by your surgeon) Aleve, Naproxen, Ibuprofen, Motrin, Advil, Goody's, BC's, all herbal medications, fish oil, and all vitamins. WHAT DO I DO ABOUT MY DIABETES MEDICATION?   Do not take oral diabetes medicines (pills) the morning of surgery.  THE NIGHT BEFORE SURGERY, take _2.5 units of insulin.       The day of surgery, do not take other diabetes injectables, including Byetta (exenatide), Bydureon (exenatide ER), Victoza (liraglutide), or Trulicity (dulaglutide).  If your CBG is greater than 220 mg/dL, you may take  of your sliding scale (correction) dose of insulin.   HOW TO MANAGE YOUR DIABETES BEFORE AND AFTER SURGERY  Why is it important to control my blood sugar before and after surgery? Improving blood sugar levels before and after surgery  helps healing and can limit problems. A way of improving blood sugar control is eating a healthy diet by:  Eating less sugar and carbohydrates  Increasing activity/exercise  Talking with your doctor about reaching your blood sugar goals High blood sugars (greater than 180 mg/dL) can raise your risk of infections and slow your recovery, so you will need to focus on controlling your diabetes during the weeks before surgery. Make sure that the doctor who takes care of your diabetes knows about your planned surgery including the date and location.  How do I manage my blood sugar before surgery? Check your blood sugar at least 4 times a day, starting 2 days before surgery, to make sure that the level is not too high or low.  Check your blood sugar the morning of your surgery when you wake up and every 2 hours until you get to the Short Stay unit.  If your blood sugar is less than 70 mg/dL, you will need to treat for low blood sugar: Do not take insulin. Treat a low blood sugar (less than 70 mg/dL) with  cup of clear juice (cranberry or apple), 4 glucose tablets, OR glucose gel. Recheck blood sugar in 15 minutes after treatment (to make sure it is greater than 70 mg/dL). If your blood sugar is not greater than 70 mg/dL on recheck, call [phone] for further instructions. Report your blood sugar to the short stay nurse when you get to Short Stay.  If you are admitted to the hospital after surgery: Your blood sugar will  be checked by the staff and you will probably be given insulin after surgery (instead of oral diabetes medicines) to make sure you have good blood sugar levels. The goal for blood sugar control after surgery is 80-180 mg/dL.                       Do not wear jewelry, make up, or nail polish            Do not wear lotions, powders, perfumes/colognes, or deodorant.            Do not shave 48 hours prior to surgery.  Men may shave face and neck.            Do not bring valuables  to the hospital.            [facility] is not responsible for any belongings or valuables.  Do NOT Smoke (Tobacco/Vaping) 24 hours prior to your procedure If you use a CPAP at night, you may bring all equipment for your overnight stay.   Contacts, glasses, dentures or bridgework may not be worn into surgery.      For patients admitted to the hospital, discharge time will be determined by your treatment team.   Patients discharged the day of surgery will not be allowed to drive home, and someone needs to stay with them for 24 hours.    Special instructions:   [facility]- Preparing For Surgery  Before surgery, you can play an important role. Because skin is not sterile, your skin needs to be as free of germs as possible. You can reduce the number of germs on your skin by washing with CHG (chlorahexidine gluconate) Soap before surgery.  CHG is an antiseptic cleaner which kills germs and bonds with the skin to continue killing germs even after washing.    Oral Hygiene is also important to reduce your risk of infection.  Remember - BRUSH YOUR TEETH THE MORNING OF SURGERY WITH YOUR REGULAR TOOTHPASTE  Please do not use if you have an allergy to CHG or antibacterial soaps. If your skin becomes reddened/irritated stop using the CHG.  Do not shave (including legs and underarms) for at least 48 hours prior to first CHG shower. It is OK to shave your face.  Please follow these instructions carefully.   Shower the NIGHT BEFORE SURGERY and the MORNING OF SURGERY with DIAL Soap.   Pat yourself dry with a CLEAN TOWEL.  Wear CLEAN PAJAMAS to bed the night before surgery  Place CLEAN SHEETS on your bed the night of your first shower and DO NOT SLEEP WITH PETS.   Day of Surgery: Please shower morning of surgery  Wear Clean/Comfortable clothing the morning of surgery Do not apply any deodorants/lotions.   Remember to brush your teeth WITH YOUR REGULAR TOOTHPASTE.   Questions were answered.  Patient verbalized understanding of instructions.

## 2022-09-18 ENCOUNTER — Encounter (HOSPITAL_COMMUNITY)
Admission: RE | Disposition: A | Discharge status: Home / Self Care | Payer: Self-pay | Source: Ambulatory Visit | Attending: Vascular Surgery

## 2022-09-18 ENCOUNTER — Ambulatory Visit (HOSPITAL_COMMUNITY): Payer: Self-pay | Admitting: Physician Assistant

## 2022-09-18 ENCOUNTER — Encounter (HOSPITAL_COMMUNITY): Payer: Self-pay

## 2022-09-18 ENCOUNTER — Other Ambulatory Visit (HOSPITAL_COMMUNITY): Payer: Self-pay

## 2022-09-18 ENCOUNTER — Ambulatory Visit (HOSPITAL_COMMUNITY)
Admission: RE | Admit: 2022-09-18 | Discharge: 2022-09-18 | Disposition: A | Discharge status: Home / Self Care | Payer: Medicare Other | Source: Ambulatory Visit | Attending: Vascular Surgery | Admitting: Vascular Surgery

## 2022-09-18 ENCOUNTER — Ambulatory Visit (HOSPITAL_BASED_OUTPATIENT_CLINIC_OR_DEPARTMENT_OTHER): Payer: Medicare Other | Admitting: Physician Assistant

## 2022-09-18 ENCOUNTER — Other Ambulatory Visit: Payer: Self-pay

## 2022-09-18 ENCOUNTER — Emergency Department (HOSPITAL_COMMUNITY): Payer: Medicare Other

## 2022-09-18 ENCOUNTER — Encounter (HOSPITAL_COMMUNITY): Payer: Self-pay | Admitting: Vascular Surgery

## 2022-09-18 ENCOUNTER — Emergency Department (HOSPITAL_COMMUNITY)
Admission: EM | Admit: 2022-09-18 | Discharge: 2022-09-19 | Disposition: A | Discharge status: Home / Self Care | Payer: Medicare Other | Attending: Emergency Medicine | Admitting: Emergency Medicine

## 2022-09-18 DIAGNOSIS — M79601 Pain in right arm: Secondary | ICD-10-CM | POA: Insufficient documentation

## 2022-09-18 DIAGNOSIS — Z794 Long term (current) use of insulin: Secondary | ICD-10-CM | POA: Insufficient documentation

## 2022-09-18 DIAGNOSIS — I132 Hypertensive heart and chronic kidney disease with heart failure and with stage 5 chronic kidney disease, or end stage renal disease: Secondary | ICD-10-CM | POA: Diagnosis not present

## 2022-09-18 DIAGNOSIS — N184 Chronic kidney disease, stage 4 (severe): Secondary | ICD-10-CM | POA: Insufficient documentation

## 2022-09-18 DIAGNOSIS — K3184 Gastroparesis: Secondary | ICD-10-CM | POA: Diagnosis not present

## 2022-09-18 DIAGNOSIS — Z79899 Other long term (current) drug therapy: Secondary | ICD-10-CM | POA: Insufficient documentation

## 2022-09-18 DIAGNOSIS — I130 Hypertensive heart and chronic kidney disease with heart failure and stage 1 through stage 4 chronic kidney disease, or unspecified chronic kidney disease: Secondary | ICD-10-CM | POA: Insufficient documentation

## 2022-09-18 DIAGNOSIS — Z992 Dependence on renal dialysis: Secondary | ICD-10-CM | POA: Diagnosis not present

## 2022-09-18 DIAGNOSIS — Y832 Surgical operation with anastomosis, bypass or graft as the cause of abnormal reaction of the patient, or of later complication, without mention of misadventure at the time of the procedure: Secondary | ICD-10-CM | POA: Insufficient documentation

## 2022-09-18 DIAGNOSIS — N185 Chronic kidney disease, stage 5: Secondary | ICD-10-CM | POA: Diagnosis not present

## 2022-09-18 DIAGNOSIS — N186 End stage renal disease: Secondary | ICD-10-CM | POA: Insufficient documentation

## 2022-09-18 DIAGNOSIS — T82898A Other specified complication of vascular prosthetic devices, implants and grafts, initial encounter: Secondary | ICD-10-CM | POA: Diagnosis not present

## 2022-09-18 DIAGNOSIS — E1143 Type 2 diabetes mellitus with diabetic autonomic (poly)neuropathy: Secondary | ICD-10-CM | POA: Insufficient documentation

## 2022-09-18 DIAGNOSIS — E11319 Type 2 diabetes mellitus with unspecified diabetic retinopathy without macular edema: Secondary | ICD-10-CM | POA: Insufficient documentation

## 2022-09-18 DIAGNOSIS — T82591A Other mechanical complication of surgically created arteriovenous shunt, initial encounter: Secondary | ICD-10-CM | POA: Diagnosis present

## 2022-09-18 DIAGNOSIS — E1140 Type 2 diabetes mellitus with diabetic neuropathy, unspecified: Secondary | ICD-10-CM | POA: Diagnosis not present

## 2022-09-18 DIAGNOSIS — I2510 Atherosclerotic heart disease of native coronary artery without angina pectoris: Secondary | ICD-10-CM | POA: Insufficient documentation

## 2022-09-18 DIAGNOSIS — I5042 Chronic combined systolic (congestive) and diastolic (congestive) heart failure: Secondary | ICD-10-CM | POA: Diagnosis not present

## 2022-09-18 DIAGNOSIS — I5032 Chronic diastolic (congestive) heart failure: Secondary | ICD-10-CM

## 2022-09-18 DIAGNOSIS — E1122 Type 2 diabetes mellitus with diabetic chronic kidney disease: Secondary | ICD-10-CM | POA: Insufficient documentation

## 2022-09-18 DIAGNOSIS — W19XXXA Unspecified fall, initial encounter: Secondary | ICD-10-CM

## 2022-09-18 DIAGNOSIS — T82858A Stenosis of vascular prosthetic devices, implants and grafts, initial encounter: Secondary | ICD-10-CM | POA: Insufficient documentation

## 2022-09-18 DIAGNOSIS — W010XXA Fall on same level from slipping, tripping and stumbling without subsequent striking against object, initial encounter: Secondary | ICD-10-CM | POA: Insufficient documentation

## 2022-09-18 HISTORY — PX: PATCH ANGIOPLASTY: SHX6230

## 2022-09-18 HISTORY — PX: REVISON OF ARTERIOVENOUS FISTULA: SHX6074

## 2022-09-18 LAB — GLUCOSE, CAPILLARY
Glucose-Capillary: 326 mg/dL — ABNORMAL HIGH (ref 70–99)
Glucose-Capillary: 303 mg/dL — ABNORMAL HIGH (ref 70–99)
Glucose-Capillary: 236 mg/dL — ABNORMAL HIGH (ref 70–99)

## 2022-09-18 LAB — POCT I-STAT, CHEM 8
BUN: 38 mg/dL — ABNORMAL HIGH (ref 8–23)
Calcium, Ion: 1.06 mmol/L — ABNORMAL LOW (ref 1.15–1.40)
Chloride: 98 mmol/L (ref 98–111)
Creatinine, Ser: 5.6 mg/dL — ABNORMAL HIGH (ref 0.61–1.24)
Glucose, Bld: 366 mg/dL — ABNORMAL HIGH (ref 70–99)
HCT: 44 % (ref 39.0–52.0)
Hemoglobin: 15 g/dL (ref 13.0–17.0)
Potassium: 3.9 mmol/L (ref 3.5–5.1)
Sodium: 135 mmol/L (ref 135–145)
TCO2: 25 mmol/L (ref 22–32)

## 2022-09-18 SURGERY — REVISON OF ARTERIOVENOUS FISTULA
Anesthesia: Monitor Anesthesia Care | Site: Arm Upper | Laterality: Right

## 2022-09-18 MED ORDER — METOPROLOL SUCCINATE ER 25 MG PO TB24
12.5000 mg | ORAL_TABLET | Freq: Every day | ORAL | Status: DC
  Administered 2022-09-18: 12.5 mg via ORAL
  Filled 2022-09-18: qty 1

## 2022-09-18 MED ORDER — PHENYLEPHRINE 80 MCG/ML (10ML) SYRINGE FOR IV PUSH (FOR BLOOD PRESSURE SUPPORT)
PREFILLED_SYRINGE | INTRAVENOUS | Status: AC
  Filled 2022-09-18: qty 20

## 2022-09-18 MED ORDER — CHLORHEXIDINE GLUCONATE 4 % EX SOLN: 60.0000 mL | Freq: Once | CUTANEOUS | Status: DC

## 2022-09-18 MED ORDER — INSULIN ASPART 100 UNIT/ML IJ SOLN: 0.0000 [IU] | INTRAMUSCULAR | Status: DC | PRN

## 2022-09-18 MED ORDER — PHENYLEPHRINE HCL (PRESSORS) 10 MG/ML IV SOLN
INTRAVENOUS | Status: DC | PRN
Start: 2022-09-18 — End: 2022-09-18
  Administered 2022-09-18: 80 ug via INTRAVENOUS

## 2022-09-18 MED ORDER — 0.9 % SODIUM CHLORIDE (POUR BTL) OPTIME
TOPICAL | Status: DC | PRN
  Administered 2022-09-18: 1000 mL

## 2022-09-18 MED ORDER — PHENYLEPHRINE HCL-NACL 20-0.9 MG/250ML-% IV SOLN
INTRAVENOUS | Status: DC | PRN
Start: 2022-09-18 — End: 2022-09-18
  Administered 2022-09-18: 30 ug/min via INTRAVENOUS

## 2022-09-18 MED ORDER — OXYCODONE HCL 5 MG PO TABS
5.0000 mg | ORAL_TABLET | Freq: Four times a day (QID) | ORAL | 0 refills | Status: DC | PRN
Start: 2022-09-18 — End: 2022-11-28
  Filled 2022-09-18: qty 15, 4d supply, fill #0

## 2022-09-18 MED ORDER — PROTAMINE SULFATE 10 MG/ML IV SOLN
INTRAVENOUS | Status: DC | PRN
Start: 2022-09-18 — End: 2022-09-18
  Administered 2022-09-18: 50 mg via INTRAVENOUS

## 2022-09-18 MED ORDER — MIDAZOLAM HCL 2 MG/2ML IJ SOLN
INTRAMUSCULAR | Status: AC
  Administered 2022-09-18: 2 mg via INTRAVENOUS
  Filled 2022-09-18: qty 2

## 2022-09-18 MED ORDER — HEPARIN 6000 UNIT IRRIGATION SOLUTION
Status: DC | PRN
  Administered 2022-09-18: 1

## 2022-09-18 MED ORDER — HEPARIN SODIUM (PORCINE) 1000 UNIT/ML IJ SOLN
INTRAMUSCULAR | Status: DC | PRN
Start: 2022-09-18 — End: 2022-09-18
  Administered 2022-09-18: 9000 [IU] via INTRAVENOUS

## 2022-09-18 MED ORDER — CEFAZOLIN SODIUM-DEXTROSE 2-3 GM-%(50ML) IV SOLR
INTRAVENOUS | Status: DC | PRN
Start: 2022-09-18 — End: 2022-09-18
  Administered 2022-09-18: 2 g via INTRAVENOUS

## 2022-09-18 MED ORDER — PROPOFOL 10 MG/ML IV BOLUS
INTRAVENOUS | Status: DC | PRN
  Administered 2022-09-18 (×2): 20 mg via INTRAVENOUS

## 2022-09-18 MED ORDER — MIDAZOLAM HCL 2 MG/2ML IJ SOLN: 2.0000 mg | Freq: Once | INTRAMUSCULAR | Status: AC

## 2022-09-18 MED ORDER — MIDAZOLAM HCL 2 MG/2ML IJ SOLN
INTRAMUSCULAR | Status: DC | PRN
  Administered 2022-09-18: 2 mg via INTRAVENOUS

## 2022-09-18 MED ORDER — SODIUM CHLORIDE 0.9 % IV SOLN
INTRAVENOUS | Status: DC
  Administered 2022-09-18: 10 mL/h via INTRAVENOUS

## 2022-09-18 MED ORDER — CHLORHEXIDINE GLUCONATE 0.12 % MT SOLN
15.0000 mL | Freq: Once | OROMUCOSAL | Status: AC
  Administered 2022-09-18: 15 mL via OROMUCOSAL
  Filled 2022-09-18: qty 15

## 2022-09-18 MED ORDER — FENTANYL CITRATE (PF) 100 MCG/2ML IJ SOLN
INTRAMUSCULAR | Status: AC
  Administered 2022-09-18: 100 ug via INTRAVENOUS
  Filled 2022-09-18: qty 2

## 2022-09-18 MED ORDER — CEFAZOLIN SODIUM-DEXTROSE 2-4 GM/100ML-% IV SOLN
2.0000 g | INTRAVENOUS | Status: AC
  Administered 2022-09-18: 2 g via INTRAVENOUS
  Filled 2022-09-18: qty 100

## 2022-09-18 MED ORDER — ORAL CARE MOUTH RINSE: 15.0000 mL | Freq: Once | OROMUCOSAL | Status: AC

## 2022-09-18 MED ORDER — PROPOFOL 500 MG/50ML IV EMUL
INTRAVENOUS | Status: DC | PRN
  Administered 2022-09-18: 70 ug/kg/min via INTRAVENOUS

## 2022-09-18 MED ORDER — FENTANYL CITRATE (PF) 100 MCG/2ML IJ SOLN: 100.0000 ug | Freq: Once | INTRAMUSCULAR | Status: AC

## 2022-09-18 MED ORDER — LIDOCAINE-EPINEPHRINE (PF) 1 %-1:200000 IJ SOLN
INTRAMUSCULAR | Status: DC | PRN
Start: 2022-09-18 — End: 2022-09-18
  Administered 2022-09-18: 17 mL

## 2022-09-18 MED ORDER — EPHEDRINE 5 MG/ML INJ
INTRAVENOUS | Status: AC
  Filled 2022-09-18: qty 5

## 2022-09-18 MED ORDER — INSULIN ASPART 100 UNIT/ML IJ SOLN
INTRAMUSCULAR | Status: AC
  Administered 2022-09-18: 10 [IU] via SUBCUTANEOUS
  Filled 2022-09-18: qty 1

## 2022-09-18 MED ORDER — ROPIVACAINE HCL 5 MG/ML IJ SOLN
INTRAMUSCULAR | Status: DC | PRN
  Administered 2022-09-18 (×4): 5 mL via PERINEURAL

## 2022-09-18 SURGICAL SUPPLY — 36 items
ADH SKN CLS APL DERMABOND .7 (GAUZE/BANDAGES/DRESSINGS) ×1
ARMBAND PINK RESTRICT EXTREMIT (MISCELLANEOUS) ×1 IMPLANT
BAG COUNTER SPONGE SURGICOUNT (BAG) ×1 IMPLANT
BAG SPNG CNTER NS LX DISP (BAG) ×1
CANISTER SUCT 3000ML PPV (MISCELLANEOUS) ×1 IMPLANT
CANNULA VESSEL 3MM 2 BLNT TIP (CANNULA) ×1 IMPLANT
CLIP TI MEDIUM 6 (CLIP) ×1 IMPLANT
CLIP TI WIDE RED SMALL 6 (CLIP) ×1 IMPLANT
COVER PROBE W GEL 5X96 (DRAPES) IMPLANT
DERMABOND ADVANCED .7 DNX12 (GAUZE/BANDAGES/DRESSINGS) ×1 IMPLANT
ELECT REM PT RETURN 9FT ADLT (ELECTROSURGICAL) ×1
ELECTRODE REM PT RTRN 9FT ADLT (ELECTROSURGICAL) ×1 IMPLANT
GLOVE BIO SURGEON STRL SZ7.5 (GLOVE) ×1 IMPLANT
GLOVE BIOGEL PI IND STRL 8 (GLOVE) ×1 IMPLANT
GLOVE SURG POLY ORTHO LF SZ7.5 (GLOVE) IMPLANT
GLOVE SURG UNDER LTX SZ8 (GLOVE) ×1 IMPLANT
GOWN STRL REUS W/ TWL LRG LVL3 (GOWN DISPOSABLE) ×3 IMPLANT
GOWN STRL REUS W/TWL LRG LVL3 (GOWN DISPOSABLE) ×3
KIT BASIN OR (CUSTOM PROCEDURE TRAY) ×1 IMPLANT
KIT TURNOVER KIT B (KITS) ×1 IMPLANT
NS IRRIG 1000ML POUR BTL (IV SOLUTION) ×1 IMPLANT
PACK CV ACCESS (CUSTOM PROCEDURE TRAY) ×1 IMPLANT
PAD ARMBOARD 7.5X6 YLW CONV (MISCELLANEOUS) ×2 IMPLANT
PATCH VASC XENOSURE 1X6 (Vascular Products) IMPLANT
SLING ARM FOAM STRAP LRG (SOFTGOODS) IMPLANT
SLING ARM FOAM STRAP MED (SOFTGOODS) IMPLANT
SPONGE SURGIFOAM ABS GEL 100 (HEMOSTASIS) IMPLANT
SUT MNCRL AB 4-0 PS2 18 (SUTURE) ×1 IMPLANT
SUT PROLENE 6 0 BV (SUTURE) ×1 IMPLANT
SUT SILK 3 0 (SUTURE) ×1
SUT SILK 3-0 18XBRD TIE 12 (SUTURE) IMPLANT
SUT VIC AB 3-0 SH 27 (SUTURE) ×1
SUT VIC AB 3-0 SH 27X BRD (SUTURE) ×1 IMPLANT
TOWEL GREEN STERILE (TOWEL DISPOSABLE) ×1 IMPLANT
UNDERPAD 30X36 HEAVY ABSORB (UNDERPADS AND DIAPERS) ×1 IMPLANT
WATER STERILE IRR 1000ML POUR (IV SOLUTION) ×1 IMPLANT

## 2022-09-18 NOTE — Transfer of Care (Signed)
Immediate Anesthesia Transfer of Care Note  Patient: Ryan Jenkins  Procedure(s) Performed: REVISON OF RIGHT UPPER ARM ARTERIOVENOUS FISTULA (Right: Arm Upper) BOVINE PATCH ANGIOPLASTY 1CM x 6CM (Right: Arm Upper)  Patient Location: PACU  Anesthesia Type:MAC combined with regional for post-op pain  Level of Consciousness: awake and alert   Airway & Oxygen Therapy: Patient Spontanous Breathing and Patient connected to face mask oxygen  Post-op Assessment: Report given to RN and Post -op Vital signs reviewed and stable  Post vital signs: Reviewed and stable  Last Vitals:  Vitals Value Taken Time  BP 119/71 09/18/22 1135  Temp    Pulse 80 09/18/22 1137  Resp 15 09/18/22 1137  SpO2 100 % 09/18/22 1137  Vitals shown include unfiled device data.  Last Pain:  Vitals:   09/18/22 0835  TempSrc:   PainSc: 4          Complications: No notable events documented.

## 2022-09-18 NOTE — ED Provider Triage Note (Signed)
Emergency Medicine Provider Triage Evaluation Note  Ryan Jenkins , a 62 y.o. male  was evaluated in triage.  Pt complains of fall. Pt was trying to transfer himself from his recliner to his wheelchair when he slipped, fell and injured his R upper arm.  No neck, shoulder or elbow pain.  No numbness  Review of Systems  Positive: As above Negative: As above  Physical Exam  BP (!) 167/92 (BP Location: Left Arm)   Pulse 86   Temp 97.7 F (36.5 C) (Oral)   Resp 18   SpO2 100%  Gen:   Awake, no distress   Resp:  Normal effort  MSK:   Moves extremities without difficulty  Other:  Wearing a sling on the right side  Medical Decision Making  Medically screening exam initiated at 9:42 PM.  Appropriate orders placed.  Ryan Jenkins was informed that the remainder of the evaluation will be completed by another provider, this initial triage assessment does not replace that evaluation, and the importance of remaining in the ED until their evaluation is complete.     Fayrene Helper, PA-C 09/18/22 2144

## 2022-09-18 NOTE — Discharge Instructions (Signed)
[facility]  Discharge Instructions  AV Fistula or Graft Surgery for Dialysis Access  Please refer to the following instructions for your post-procedure care. Your surgeon or physician assistant will discuss any changes with you.  Activity  You may drive the day following your surgery, if you are comfortable and no longer taking prescription pain medication. Resume full activity as the soreness in your incision resolves.  Bathing/Showering  You may shower after you go home. Keep your incision dry for 48 hours. Do not soak in a bathtub, hot tub, or swim until the incision heals completely. You may not shower if you have a hemodialysis catheter.  Incision Care  Clean your incision with mild soap and water after 48 hours. Pat the area dry with a clean towel. You do not need a bandage unless otherwise instructed. Do not apply any ointments or creams to your incision. You may have skin glue on your incision. Do not peel it off. It will come off on its own in about one week. Your arm may swell a bit after surgery. To reduce swelling use pillows to elevate your arm so it is above your heart. Your doctor will tell you if you need to lightly wrap your arm with an ACE bandage.  Diet  Resume your normal diet. There are not special food restrictions following this procedure. In order to heal from your surgery, it is CRITICAL to get adequate nutrition. Your body requires vitamins, minerals, and protein. Vegetables are the best source of vitamins and minerals. Vegetables also provide the perfect balance of protein. Processed food has little nutritional value, so try to avoid this.  Medications  Resume taking all of your medications. If your incision is causing pain, you may take over-the counter pain relievers such as acetaminophen (Tylenol). If you were prescribed a stronger pain medication, please be aware these medications can cause nausea and constipation. Prevent  nausea by taking the medication with a snack or meal. Avoid constipation by drinking plenty of fluids and eating foods with high amount of fiber, such as fruits, vegetables, and grains.  Do not take Tylenol if you are taking prescription pain medications.  Follow up Your surgeon may want to see you in the office following your access surgery. If so, this will be arranged at the time of your surgery.  Please call us immediately for any of the following conditions:  Increased pain, redness, drainage (pus) from your incision site Fever of 101 degrees or higher Severe or worsening pain at your incision site Hand pain or numbness.  Reduce your risk of vascular disease:  Stop smoking. If you would like help, call [facility] at 1-800-QUIT-NOW ((701)545-6736) or [place] at (985)869-2485  Manage your cholesterol Maintain a desired weight Control your diabetes Keep your blood pressure down  Dialysis  It will take several weeks to several months for your new dialysis access to be ready for use. Your surgeon will determine when it is okay to use it. Your nephrologist will continue to direct your dialysis. You can continue to use your Permcath until your new access is ready for use.   09/18/2022 Ryan Jenkins [id number] 12-15-1960  Surgeon(s): Chuck Hint, MD  Procedure(s): REVISON OF RIGHT UPPER ARM ARTERIOVENOUS FISTULA BOVINE PATCH ANGIOPLASTY 1CM x 6CM   May stick graft immediately   May stick graft on designated area only:   x Do not stick fistula for 8 weeks    If you have any  questions, please call the office at [phone].

## 2022-09-18 NOTE — ED Triage Notes (Addendum)
Pt bib [facility] coming from home after a fall. Discharged from the hospital today after surgery (fistula) on his right upper arm. Fell on arm and has not been able to feel it prior to the fall. Pt states he has not been able to feel in his arm but now he can after the fall and is concerned. Pt was trying to get up from the bed in the chair when fall occurred, has weakness in legs. Pt denies LOC. Hx of CHF, Diabetes, Neuropathy, dialysis pt (MWF). Pt alert and oriented.   EMS vital signs: 148/80 86  98% RA 376 cbg

## 2022-09-18 NOTE — H&P (Signed)
ASSESSMENT & PLAN   END-STAGE RENAL DISEASE: This patient has a poorly maturing right upper arm fistula.  Based on his fistulogram I have recommended ligation of multiple competing branches and possible patch angioplasty of the proximal fistula.  We have discussed the indications for the procedure and the potential complications and he is agreeable to proceed.  He has a functioning catheter.  He dialyzes on Monday Wednesdays and Fridays.   REASON FOR VISIT:    For revision of right upper arm fistula  HPI:   Ryan Jenkins is a 62 y.o. male who had a right radiocephalic fistula placed by Dr. Karin Lieu on 05/24/2022.  He has a high bifurcation of the brachial artery and was noted to have a 3 mm radial artery at the antecubital fossa and a 4 mm cephalic vein.  He dialyzes on Monday Wednesdays and Fridays.  He was seen in the office on 07/05/2022 and the fistula was slow to mature.  Diameters of the fistula ranged from 3.9-4.3 mm.  He had multiple competing branches and also an area in the proximal fistula with a retained valve.  For this reason he was set up for a fistulogram.  His fistulogram was performed on 7-24.  He had multiple competing branches in the right upper arm radiocephalic fistula.  The vein in general was small.  There was an area of stenosis just proximal to the largest competing branch above the antecubital fossa.  There was no central venous stenosis.  I felt the best option to try to salvage this fistula would be ligation of multiple competing branches and possible patch angioplasty of the area of stenosis in the proximal fistula.   Past Medical History:  Diagnosis Date   Anemia    Anginal pain (HCC)    Anxiety    Arthritis    BPH with obstruction/lower urinary tract symptoms    CAD (coronary artery disease)    cardiologist--- dr Lucienne Minks badal;  11-06-2019 cardiac cath in [place] (result in care everywhere)  nonobstructive cad involing pRCA 60% (done in setting worseing CHF/  acute pulmonary edema requiring intubation/ AKI   Chronic combined systolic and diastolic CHF (congestive heart failure) (HCC)    Chronic kidney disease, stage IV (severe) (HCC)    nephrologist--- dr Ronalee Belts   Depression    Diabetic neuropathy (HCC)    Dyspnea    Edema of both lower extremities    Gastric ulcer without hemorrhage or perforation 12/25/2018   GERD (gastroesophageal reflux disease)    Heart murmur    Hemodialysis patient (HCC)    History of community acquired pneumonia    ad[facility]  w/ ARF hypoxia w/ severe sepsis   Hyperlipidemia    Hypertension    Insulin dependent type 2 diabetes mellitus (HCC)    LBBB (left bundle branch block)    Pneumonia    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)    Uses walker    Vitamin B12 deficiency    Vitreous hemorrhage (HCC)     Family History  Problem Relation Age of Onset   Renal cancer Mother    Hypertension Mother    Pancreatic cancer Mother    Hypertension Sister    Stroke Sister    Leukemia Maternal Uncle    Sickle cell trait Maternal Aunt    Colon cancer Neg Hx    Esophageal cancer Neg Hx    Rectal cancer Neg Hx    Stomach cancer Neg Hx  SOCIAL HISTORY: Social History   Tobacco Use   Smoking status: Never    Passive exposure: Never   Smokeless tobacco: Never  Substance Use Topics   Alcohol use: No    Alcohol/week: 0.0 standard drinks of alcohol    Allergies  Allergen Reactions   Claritin [Loratadine] Swelling and Other (See Comments)    Joint swelling    Hydrochlorothiazide Other (See Comments)    Dizziness   Latex Hives and Swelling   Lyrica [Pregabalin] Other (See Comments)    Depression. "Makes me loopy"    Metformin And Related Diarrhea and Nausea And Vomiting    Current Facility-Administered Medications  Medication Dose Route Frequency Provider Last Rate Last Admin   0.9 %  sodium chloride infusion   Intravenous Continuous Chuck Hint, MD       ceFAZolin  (ANCEF) IVPB 2g/100 mL premix  2 g Intravenous 30 min Pre-Op Chuck Hint, MD       chlorhexidine (HIBICLENS) 4 % liquid 4 Application  60 mL Topical Once Chuck Hint, MD       And   [START ON 09/19/2022] chlorhexidine (HIBICLENS) 4 % liquid 4 Application  60 mL Topical Once Chuck Hint, MD        REVIEW OF SYSTEMS:  [X]  denotes positive finding, [ ]  denotes negative finding Cardiac  Comments:  Chest pain or chest pressure:    Shortness of breath upon exertion:    Short of breath when lying flat:    Irregular heart rhythm:        Vascular    Pain in calf, thigh, or hip brought on by ambulation:    Pain in feet at night that wakes you up from your sleep:     Blood clot in your veins:    Leg swelling:         Pulmonary    Oxygen at home:    Productive cough:     Wheezing:         Neurologic    Sudden weakness in arms or legs:     Sudden numbness in arms or legs:     Sudden onset of difficulty speaking or slurred speech:    Temporary loss of vision in one eye:     Problems with dizziness:         Gastrointestinal    Blood in stool:     Vomited blood:         Genitourinary    Burning when urinating:     Blood in urine:        Psychiatric    Major depression:         Hematologic    Bleeding problems:    Problems with blood clotting too easily:        Skin    Rashes or ulcers:        Constitutional    Fever or chills:    -  PHYSICAL EXAM:   Vitals:   09/14/22 1602  Weight: 98.4 kg   Body mass index is 27.86 kg/m. GENERAL: The patient is a well-nourished male, in no acute distress. The vital signs are documented above. CARDIAC: There is a regular rate and rhythm.  VASCULAR: He has a palpable thrill in his right upper arm fistula. PULMONARY: There is good air exchange bilaterally.  MUSCULOSKELETAL: There are no major deformities. NEUROLOGIC: No focal weakness or paresthesias are detected. SKIN: There are no ulcers or rashes  noted. PSYCHIATRIC: The patient  has a normal affect.  DATA:    FISTULOGRAM:        Waverly Ferrari [facility]

## 2022-09-18 NOTE — Anesthesia Procedure Notes (Signed)
Anesthesia Regional Block: Supraclavicular block   Pre-Anesthetic Checklist: , timeout performed,  Correct Patient, Correct Site, Correct Laterality,  Correct Procedure, Correct Position, site marked,  Risks and benefits discussed,  Surgical consent,  Pre-op evaluation,  At surgeon's request and post-op pain management  Laterality: Upper  Prep: chloraprep       Needles:  Injection technique: Single-shot  Needle Type: Echogenic Stimulator Needle     Needle Length: 9cm  Needle Gauge: 20   Needle insertion depth: 2 cm   Additional Needles:   Procedures:,,,, ultrasound used (permanent image in chart),,    Narrative:  Start time: 09/18/2022 9:22 AM End time: 09/18/2022 9:30 AM Injection made incrementally with aspirations every 5 mL.  Performed by: Personally  Anesthesiologist: Leilani Able, MD

## 2022-09-18 NOTE — Op Note (Signed)
    NAME: Ryan Jenkins    MRN: [id number] DOB: 10-03-1960    DATE OF OPERATION: 09/18/2022  PREOP DIAGNOSIS:    Poorly maturing right upper arm fistula  POSTOP DIAGNOSIS:    Same  PROCEDURE:    Revision of right upper arm fistula. Ligation of multiple competing branches. Bovine pericardial patch angioplasty of proximal stenosis  SURGEON: Di Kindle. Edilia Bo, MD  ASSIST: Kayren Eaves, PA  ANESTHESIA: Block  EBL: Minimal  INDICATIONS:    Ryan Jenkins is a 62 y.o. male who had a right upper arm radiocephalic fistula placed on 05/24/2022.  He has a high bifurcation of the brachial artery.  The vein was slow to mature.  A fistulogram showed a retained valve and significant stenosis proximally with multiple competing branches.  He comes in for revision  FINDINGS:   Approximately 6 7-8 branches were ligated.  There was significant intimal hyperplasia in the proximal fistula and this was patched with a bovine pericardial patch.  The vein in the upper arm was somewhat small.  TECHNIQUE:   The patient was taken to the operating room.  I looked at the fistula in the right arm with the SonoSite.  Multiple branches were identified.  The 2 largest branches were identified.  The area of stenosis was identified.  The right arm was then prepped and draped in the usual sterile fashion.  A longitudinal incision was made over the proximal fistula where there was a stenosis.  The vein here was dissected free.  Using 2 additional incisions along the distal in the right upper arm I dissected the fistula circumferentially freed all the way up to the shoulder.  Multiple branches were divided between clips and 3-0 silk ties.  There were approximately 7-8  branches.  None of the branches were big enough to uses a vein patch.  I therefore elected to use a bovine pericardial patch proximally.  The patient was heparinized.  The fistula was clamped proximally distally and a longitudinal  arteriotomy was made.  There was marked intimal hyperplasia proximally and I felt the best option was to patch this versus placing an interposition PTFE graft.  The bovine pericardial patch was then sewn using continuous 6-0 Prolene suture.  There was a good thrill at the completion.  Hemostasis was obtained in the wounds and the heparin was partially reversed with protamine.  Each of the wounds was closed with a 4-0 Monocryl.  Dermabond was applied.  The patient tolerated the procedure well and was transferred to the recovery in stable condition.  All needle and sponge counts were correct.  Given the complexity of the case a first assistant was necessary in order to expedient the procedure and safely perform the technical aspects of the operation.  Waverly Ferrari, MD, FACS [facility]  DATE OF DICTATION:   09/18/2022

## 2022-09-18 NOTE — Anesthesia Postprocedure Evaluation (Signed)
Anesthesia Post Note  Patient: Ryan Jenkins  Procedure(s) Performed: REVISON OF RIGHT UPPER ARM ARTERIOVENOUS FISTULA (Right: Arm Upper) BOVINE PATCH ANGIOPLASTY 1CM x 6CM (Right: Arm Upper)     Patient location during evaluation: PACU Anesthesia Type: Regional and MAC Level of consciousness: awake Pain management: pain level controlled Vital Signs Assessment: post-procedure vital signs reviewed and stable Respiratory status: spontaneous breathing Cardiovascular status: stable Postop Assessment: no apparent nausea or vomiting Anesthetic complications: no  No notable events documented.  Last Vitals:  Vitals:   09/18/22 1145 09/18/22 1200  BP: 116/75 139/79  Pulse: 80 79  Resp: 17 14  Temp:  36.5 C  SpO2: 98% 99%    Last Pain:  Vitals:   09/18/22 1200  TempSrc:   PainSc: 0-No pain                 John F Scharlene Corn

## 2022-09-19 ENCOUNTER — Encounter (HOSPITAL_COMMUNITY): Payer: Self-pay | Admitting: Vascular Surgery

## 2022-09-19 MED ORDER — OXYCODONE HCL 5 MG PO TABS
5.0000 mg | ORAL_TABLET | Freq: Once | ORAL | Status: DC
  Filled 2022-09-19: qty 1

## 2022-09-19 NOTE — ED Provider Notes (Signed)
[facility] DEPT [facility] Emergency Department Provider Note MRN:  [id number]  Arrival date & time: 09/19/22     Chief Complaint   Fall and Arm Injury   History of Present Illness   Ryan Jenkins is a 62 y.o. year-old male with a history of CAD, CKD presenting to the ED with chief complaint of arm pain.  Patient tripped and fell this evening.  Had surgery on the right arm, fistula revision earlier same day.  Was concerned that he disrupted the new fistula, here for evaluation.  No bleeding, no swelling, denies head trauma or loss consciousness, no other injuries or complaints.  The arm hurts.  Earlier this morning the arm was numb from the nerve block, now it hurts.  Review of Systems  A thorough review of systems was obtained and all systems are negative except as noted in the HPI and PMH.   Patient's Health History    Past Medical History:  Diagnosis Date   Anemia    Anginal pain (HCC)    Anxiety    Arthritis    BPH with obstruction/lower urinary tract symptoms    CAD (coronary artery disease)    cardiologist--- dr Lucienne Minks badal;  11-06-2019 cardiac cath in [place] (result in care everywhere)  nonobstructive cad involing pRCA 60% (done in setting worseing CHF/ acute pulmonary edema requiring intubation/ AKI   Chronic combined systolic and diastolic CHF (congestive heart failure) (HCC)    Chronic kidney disease, stage IV (severe) (HCC)    nephrologist--- dr Ronalee Belts   Depression    Diabetic neuropathy (HCC)    Dyspnea    Edema of both lower extremities    Gastric ulcer without hemorrhage or perforation 12/25/2018   GERD (gastroesophageal reflux disease)    Heart murmur    Hemodialysis patient (HCC)    History of community acquired pneumonia    ad[facility]-20[facility]  w/ ARF hypoxia w/ severe sepsis   Hyperlipidemia    Hypertension    Insulin dependent type 2 diabetes mellitus (HCC)    LBBB (left bundle branch block)    Pneumonia    Retinopathy due  to secondary diabetes (HCC)    Uses walker    Vitamin B12 deficiency    Vitreous hemorrhage [facility])     Past Surgical History:  Procedure Laterality Date   A/V FISTULAGRAM Right 07/17/2022   Procedure: A/V Fistulagram;  Surgeon: Chuck Hint, MD;  Location: [facility] INVASIVE CV LAB;  Service: Vascular;  Laterality: Right;   AV FISTULA PLACEMENT Right 05/24/2022   Procedure: RADIOCEPHALIC ARTERIOVENOUS (AV) FISTULA CREATION;  Surgeon: Victorino Sparrow, MD;  Location: [facility] OR;  Service: Vascular;  Laterality: Right;   CARDIAC CATHETERIZATION  11/06/2019   [facility] in [place];    nonobstructive CAD , pRCA 60% (result in care everywhere)   CATARACT EXTRACTION W/ INTRAOCULAR LENS IMPLANT Bilateral 2017   IR FLUORO GUIDE CV LINE RIGHT  05/23/2022   IR US GUIDE VASC ACCESS RIGHT  05/23/2022   TRANSURETHRAL RESECTION OF PROSTATE N/A 10/16/2021   Procedure: TRANSURETHRAL RESECTION OF THE PROSTATE (TURP);  Surgeon: Jannifer Hick, MD;  Location: [facility] ORS;  Service: Urology;  Laterality: N/A;    Family History  Problem Relation Age of Onset   Renal cancer Mother    Hypertension Mother    Pancreatic cancer Mother    Hypertension Sister    Stroke Sister    Leukemia Maternal Uncle    Sickle cell trait Maternal Aunt  Colon cancer Neg Hx    Esophageal cancer Neg Hx    Rectal cancer Neg Hx    Stomach cancer Neg Hx     Social History   Socioeconomic History   Marital status: Divorced    Spouse name: Not on file   Number of children: 1   Years of education: 12   Highest education level: Not on file  Occupational History   Occupation: unemployed    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, Archivist  Tobacco Use   Smoking status: Never    Passive exposure: Never   Smokeless tobacco: Never  Vaping Use   Vaping status: Never Used  Substance and Sexual Activity   Alcohol use: No    Alcohol/week: 0.0 standard drinks of alcohol   Drug use: No   Sexual activity: Not on file  Other Topics Concern   Not  on file  Social History Narrative   Not on file   Social Determinants of Health   Financial Resource Strain: Low Risk  (01/18/2022)   Received from [facility], [facility]   Overall Financial Resource Strain (CARDIA)    Difficulty of Paying Living Expenses: Not very hard  Food Insecurity: No Food Insecurity (05/27/2022)   Hunger Vital Sign    Worried About Running Out of Food in the Last Year: Never true    Ran Out of Food in the Last Year: Never true  Transportation Needs: No Transportation Needs (05/27/2022)   PRAPARE - Administrator, Civil Service (Medical): No    Lack of Transportation (Non-Medical): No  Physical Activity: Inactive (11/23/2020)   Received from [facility], [facility]   Exercise Vital Sign    Days of Exercise per Week: 0 days    Minutes of Exercise per Session: 0 min  Stress: Stress Concern Present (11/23/2020)   Received from [facility], [facility] - Occupational Stress Questionnaire    Feeling of Stress : To some extent  Social Connections: Unknown (05/16/2021)   Received from [facility], [facility]   Social Network    Social Network: Not on file  Intimate Partner Violence: Not At Risk (05/27/2022)   Humiliation, Afraid, Rape, and Kick questionnaire    Fear of Current or Ex-Partner: No    Emotionally Abused: No    Physically Abused: No    Sexually Abused: No     Physical Exam   Vitals:   09/18/22 2336 09/19/22 0248  BP: (!) 146/76 (!) 176/89  Pulse: 88 95  Resp: 18 (!) 22  Temp: 99 F (37.2 C)   SpO2: 100% 100%    CONSTITUTIONAL: Well-appearing, NAD NEURO/PSYCH:  Alert and oriented x 3, no focal deficits EYES:  eyes equal and reactive ENT/NECK:  no LAD, no JVD CARDIO: Regular rate, well-perfused, normal S1 and S2 PULM:  CTAB no wheezing or rhonchi GI/GU:  non-distended, non-tender MSK/SPINE:  No gross deformities, no edema SKIN:  no rash, atraumatic   *Additional  and/or pertinent findings included in MDM below  Diagnostic and Interventional Summary    EKG Interpretation Date/Time:    Ventricular Rate:    PR Interval:    QRS Duration:    QT Interval:    QTC Calculation:   R Axis:      Text Interpretation:         Labs Reviewed - No data to display  DG Humerus Right  Final Result      Medications  oxyCODONE (Oxy  IR/ROXICODONE) immediate release tablet 5 mg (has no administration in time range)     Procedures  /  Critical Care Procedures  ED Course and Medical Decision Making  Initial Impression and Ddx Patient is largely nontraumatic exam.  The arm looks good.  The incision sites have intact Dermabond, he has normal range of motion of the shoulder elbow and wrist.  X-ray of the humerus obtained in triage is normal.  He has a good palpable thrill to the AV fistula.  The limb is neurovascularly intact, there is no significant swelling, nothing to suggest AV fistula failure or significant vascular complication.  Past medical/surgical history that increases complexity of ED encounter: CKD  Interpretation of Diagnostics I personally reviewed the humerus x-ray and my interpretation is as follows: No fracture    Patient Reassessment and Ultimate Disposition/Management     No emergent process, appropriate for discharge with vascular surgery follow-up.  Patient management required discussion with the following services or consulting groups: None  Complexity of Problems Addressed Acute illness or injury that poses threat of life of bodily function  Additional Data Reviewed and Analyzed Further history obtained from: Recent Consult notes  Additional Factors Impacting ED Encounter Risk None  Elmer Sow. Pilar Plate, MD [facility] Emergency Medicine [facility] [email or web address]  Final Clinical Impressions(s) / ED Diagnoses     ICD-10-CM   1. Fall, initial encounter  W19.XXXA     2. Pain of right upper extremity   M79.601       ED Discharge Orders     None        Discharge Instructions Discussed with and Provided to Patient:   Discharge Instructions   None      Sabas Sous, MD 09/19/22 0330

## 2022-10-16 ENCOUNTER — Other Ambulatory Visit: Payer: Self-pay | Admitting: *Deleted

## 2022-10-16 DIAGNOSIS — N186 End stage renal disease: Secondary | ICD-10-CM

## 2022-10-29 ENCOUNTER — Ambulatory Visit (HOSPITAL_COMMUNITY): Payer: Medicare Other

## 2022-11-13 ENCOUNTER — Ambulatory Visit: Payer: Medicare HMO | Admitting: Podiatry

## 2022-11-28 ENCOUNTER — Emergency Department (HOSPITAL_COMMUNITY)
Admission: EM | Admit: 2022-11-28 | Discharge: 2022-11-28 | Disposition: A | Discharge status: Home / Self Care | Payer: Medicare HMO | Attending: Emergency Medicine | Admitting: Emergency Medicine

## 2022-11-28 ENCOUNTER — Encounter (HOSPITAL_COMMUNITY): Payer: Self-pay

## 2022-11-28 ENCOUNTER — Emergency Department (HOSPITAL_COMMUNITY): Payer: Medicare HMO

## 2022-11-28 DIAGNOSIS — Z794 Long term (current) use of insulin: Secondary | ICD-10-CM | POA: Diagnosis not present

## 2022-11-28 DIAGNOSIS — Z79899 Other long term (current) drug therapy: Secondary | ICD-10-CM | POA: Insufficient documentation

## 2022-11-28 DIAGNOSIS — R0789 Other chest pain: Secondary | ICD-10-CM | POA: Insufficient documentation

## 2022-11-28 DIAGNOSIS — I130 Hypertensive heart and chronic kidney disease with heart failure and stage 1 through stage 4 chronic kidney disease, or unspecified chronic kidney disease: Secondary | ICD-10-CM | POA: Insufficient documentation

## 2022-11-28 DIAGNOSIS — Z91040 Latex allergy status: Secondary | ICD-10-CM | POA: Insufficient documentation

## 2022-11-28 DIAGNOSIS — N189 Chronic kidney disease, unspecified: Secondary | ICD-10-CM | POA: Diagnosis not present

## 2022-11-28 DIAGNOSIS — I509 Heart failure, unspecified: Secondary | ICD-10-CM | POA: Diagnosis not present

## 2022-11-28 DIAGNOSIS — I2510 Atherosclerotic heart disease of native coronary artery without angina pectoris: Secondary | ICD-10-CM | POA: Insufficient documentation

## 2022-11-28 DIAGNOSIS — E1122 Type 2 diabetes mellitus with diabetic chronic kidney disease: Secondary | ICD-10-CM | POA: Diagnosis not present

## 2022-11-28 DIAGNOSIS — Z992 Dependence on renal dialysis: Secondary | ICD-10-CM | POA: Insufficient documentation

## 2022-11-28 LAB — COMPREHENSIVE METABOLIC PANEL
ALT: 16 U/L (ref 0–44)
AST: 27 U/L (ref 15–41)
Albumin: 3.9 g/dL (ref 3.5–5.0)
Alkaline Phosphatase: 88 U/L (ref 38–126)
Anion gap: 15 (ref 5–15)
BUN: 25 mg/dL — ABNORMAL HIGH (ref 8–23)
CO2: 23 mmol/L (ref 22–32)
Calcium: 8.9 mg/dL (ref 8.9–10.3)
Chloride: 95 mmol/L — ABNORMAL LOW (ref 98–111)
Creatinine, Ser: 5.18 mg/dL — ABNORMAL HIGH (ref 0.61–1.24)
GFR, Estimated: 12 mL/min — ABNORMAL LOW (ref >60)
Glucose, Bld: 164 mg/dL — ABNORMAL HIGH (ref 70–99)
Potassium: 4.4 mmol/L (ref 3.5–5.1)
Sodium: 133 mmol/L — ABNORMAL LOW (ref 135–145)
Total Bilirubin: 0.5 mg/dL (ref <1.2)
Total Protein: 7.6 g/dL (ref 6.5–8.1)

## 2022-11-28 LAB — CBC
HCT: 37.8 % — ABNORMAL LOW (ref 39.0–52.0)
Hemoglobin: 12.4 g/dL — ABNORMAL LOW (ref 13.0–17.0)
MCH: 31.3 pg (ref 26.0–34.0)
MCHC: 32.8 g/dL (ref 30.0–36.0)
MCV: 95.5 fL (ref 80.0–100.0)
Platelets: 261 10*3/uL (ref 150–400)
RBC: 3.96 MIL/uL — ABNORMAL LOW (ref 4.22–5.81)
RDW: 13.5 % (ref 11.5–15.5)
WBC: 7.3 10*3/uL (ref 4.0–10.5)
nRBC: 0 % (ref 0.0–0.2)

## 2022-11-28 LAB — TROPONIN I (HIGH SENSITIVITY)
Troponin I (High Sensitivity): 47 ng/L — ABNORMAL HIGH (ref <18)
Troponin I (High Sensitivity): 43 ng/L — ABNORMAL HIGH (ref <18)

## 2022-11-28 MED ORDER — ACETAMINOPHEN 500 MG PO TABS
1000.0000 mg | ORAL_TABLET | Freq: Once | ORAL | Status: AC
  Administered 2022-11-28: 1000 mg via ORAL
  Filled 2022-11-28: qty 2

## 2022-11-28 MED ORDER — FAMOTIDINE 20 MG PO TABS
20.0000 mg | ORAL_TABLET | Freq: Once | ORAL | Status: AC
  Administered 2022-11-28: 20 mg via ORAL
  Filled 2022-11-28: qty 1

## 2022-11-28 MED ORDER — ALUM & MAG HYDROXIDE-SIMETH 200-200-20 MG/5ML PO SUSP
30.0000 mL | Freq: Once | ORAL | Status: AC
  Administered 2022-11-28: 30 mL via ORAL
  Filled 2022-11-28: qty 30

## 2022-11-28 NOTE — ED Notes (Signed)
Got patient into a gown on the monitor did EKG patient is resting with call bell in reach

## 2022-11-28 NOTE — ED Triage Notes (Signed)
[facility] reports pt coming from PCP for chest pain for the past month. The last two weeks having nausea, lightheadedness and sob on exertion. Dialysis pt goes T-thurs-sat

## 2022-11-28 NOTE — ED Provider Notes (Signed)
[place] EMERGENCY DEPARTMENT AT [facility] Provider Note   CSN: [id number] Arrival date & time: 11/28/22  1016     History  Chief Complaint  Patient presents with   Chest Pain    Ryan Jenkins is a 62 y.o. male with PMHx PUD, angina, anxiety, arthritis, CAD, CHF, CKD on dialysis, diabetic neuropathy, dyspnea, GERD, HLD, HTN, DM who presents to ED concerned for constant chest pain/tightness x2 weeks.  Patient has also been having developing intermittent nausea, lightheadedness, DOE x 2 weeks. Patient also endorses intermittent chest pain/tightness during dialysis over the past 2 months. Endorses DOE but states that he has no problems walking around his apartment.   Denies fever, cough, vomiting, diarrhea, dysuria, hematuria, hematochezia. Denies recent surgery/immobilization, hx DVT/PE, hemoptysis, hx cancer in the past 6 months, calf swelling/tenderness.     Chest Pain      Home Medications Prior to Admission medications   Medication Sig Start Date End Date Taking? Authorizing Provider  amLODipine (NORVASC) 5 MG tablet Take 5 mg by mouth daily. 11/22/22  Yes [provider]  calcium acetate (PHOSLO) 667 MG capsule Take 1,334 mg by mouth with breakfast, with lunch, and with evening meal. 07/03/22  Yes [provider]  finasteride (PROSCAR) 5 MG tablet Take 1 tablet (5 mg total) by mouth daily. 07/20/20  Yes Hoy Register, MD  fosfomycin (MONUROL) 3 g PACK Take 3 g by mouth See admin instructions. Every 2-3 days for 3 doses. 11/16/22  Yes [provider]  furosemide (LASIX) 40 MG tablet Take 40 mg by mouth in the morning. 05/24/22  Yes [provider]  gabapentin (NEURONTIN) 300 MG capsule Take 300 mg by mouth 3 (three) times a week. Tuesday, Thursday, Saturday after dialysis. 11/22/22  Yes [provider]  hydrALAZINE (APRESOLINE) 25 MG tablet Take 25 mg by mouth 3 (three) times daily. 08/24/22  Yes [provider]   LANTUS SOLOSTAR 100 UNIT/ML Solostar Pen Inject 5 Units into the skin at bedtime. Patient taking differently: Inject 15 Units into the skin at bedtime. 03/29/22  Yes Dahal, Melina Schools, MD  metoprolol succinate (TOPROL-XL) 25 MG 24 hr tablet Take 0.5 tablets (12.5 mg total) by mouth daily. 03/30/22 08/19/24 Yes Dahal, Melina Schools, MD  pioglitazone (ACTOS) 15 MG tablet Take 15 mg by mouth every morning. 10/25/22  Yes [provider]  rosuvastatin (CRESTOR) 10 MG tablet Take 10 mg by mouth in the morning.   Yes [provider]  tamsulosin (FLOMAX) 0.4 MG CAPS capsule Take 0.4 mg by mouth in the morning. 05/25/21  Yes [provider]  TYLENOL 500 MG tablet Take 500-1,000 mg by mouth every 6 (six) hours as needed for mild pain or headache.   Yes [provider]      Allergies    Claritin [loratadine], Hydrochlorothiazide, Latex, Lyrica [pregabalin], and Metformin and related    Review of Systems   Review of Systems  Cardiovascular:  Positive for chest pain.    Physical Exam Updated Vital Signs BP (!) 156/96   Pulse 87   Temp 97.7 F (36.5 C) (Oral)   Resp 20   SpO2 100%  Physical Exam Vitals and nursing note reviewed.  Constitutional:      General: He is not in acute distress.    Appearance: He is not ill-appearing or toxic-appearing.  HENT:     Head: Normocephalic and atraumatic.     Mouth/Throat:     Mouth: Mucous membranes are moist.  Pharynx: No oropharyngeal exudate or posterior oropharyngeal erythema.  Eyes:     General: No scleral icterus.       Right eye: No discharge.        Left eye: No discharge.     Conjunctiva/sclera: Conjunctivae normal.  Cardiovascular:     Rate and Rhythm: Normal rate and regular rhythm.     Pulses: Normal pulses.          Dorsalis pedis pulses are 2+ on the right side and 2+ on the left side.     Heart sounds: Normal heart sounds. No murmur heard. Pulmonary:     Effort: Pulmonary effort is normal. No respiratory  distress.     Breath sounds: Normal breath sounds. No wheezing, rhonchi or rales.  Abdominal:     Tenderness: There is no abdominal tenderness.     Comments: No calf tenderness to palpation  Musculoskeletal:     Right lower leg: No edema.     Left lower leg: No edema.  Skin:    General: Skin is warm and dry.     Findings: No rash.     Comments: GCS 15. Speech is goal oriented. No deficits appreciated to CN III-XII; symmetric eyebrow raise, no facial drooping, tongue midline. Patient has equal grip strength bilaterally with 5/5 strength against resistance in all major muscle groups bilaterally. Sensation to light touch intact. Patient moves extremities without ataxia. Patient ambulatory with steady gait.   Neurological:     General: No focal deficit present.     Mental Status: He is alert. Mental status is at baseline.  Psychiatric:        Mood and Affect: Mood normal.     ED Results / Procedures / Treatments   Labs (all labs ordered are listed, but only abnormal results are displayed) Labs Reviewed  CBC - Abnormal; Notable for the following components:      Result Value   RBC 3.96 (*)    Hemoglobin 12.4 (*)    HCT 37.8 (*)    All other components within normal limits  COMPREHENSIVE METABOLIC PANEL - Abnormal; Notable for the following components:   Sodium 133 (*)    Chloride 95 (*)    Glucose, Bld 164 (*)    BUN 25 (*)    Creatinine, Ser 5.18 (*)    GFR, Estimated 12 (*)    All other components within normal limits  TROPONIN I (HIGH SENSITIVITY) - Abnormal; Notable for the following components:   Troponin I (High Sensitivity) 47 (*)    All other components within normal limits  TROPONIN I (HIGH SENSITIVITY) - Abnormal; Notable for the following components:   Troponin I (High Sensitivity) 43 (*)    All other components within normal limits    EKG EKG Interpretation Date/Time:  Wednesday November 28 2022 10:24:14 EST Ventricular Rate:  94 PR Interval:  187 QRS  Duration:  108 QT Interval:  393 QTC Calculation: 492 R Axis:   -39  Text Interpretation: Sinus rhythm Left atrial enlargement LVH with secondary repolarization abnormality Anterior Q waves, possibly due to LVH Confirmed by Glyn Ade [phone]) on 11/28/2022 1:22:08 PM  Radiology DG Chest 2 View  Result Date: 11/28/2022 CLINICAL DATA:  Chest pain. EXAM: CHEST - 2 VIEW COMPARISON:  05/27/2022. FINDINGS: Bilateral lung fields are clear. Bilateral costophrenic angles are clear. Normal cardio-mediastinal silhouette. No acute osseous abnormalities. The soft tissues are within normal limits. Right IJ hemodialysis catheter noted with its tip overlying the midportion of  superior vena cava. IMPRESSION: *No active cardiopulmonary disease. Electronically Signed   By: Jules Schick M.D.   On: 11/28/2022 13:19    Procedures Procedures    Medications Ordered in ED Medications  alum & mag hydroxide-simeth (MAALOX/MYLANTA) 200-200-20 MG/5ML suspension 30 mL (30 mLs Oral Given 11/28/22 1331)  famotidine (PEPCID) tablet 20 mg (20 mg Oral Given 11/28/22 1331)  acetaminophen (TYLENOL) tablet 1,000 mg (1,000 mg Oral Given 11/28/22 1331)    ED Course/ Medical Decision Making/ A&P                                 Medical Decision Making Amount and/or Complexity of Data Reviewed Labs: ordered. Radiology: ordered.  Risk OTC drugs.   This patient presents to the ED for concern of chest pain, this involves an extensive number of treatment options, and is a complaint that carries with it a high risk of complications and morbidity.  The differential diagnosis includes acute coronary syndrome, congestive heart failure, pericarditis, pneumonia, pulmonary embolism, tension pneumothorax, esophageal rupture, aortic dissection, cardiac tamponade, musculoskeletal   Co morbidities that complicate the patient evaluation  PUD, angina, anxiety, arthritis, CAD, CHF, CKD on dialysis, diabetic neuropathy,  dyspnea, GERD, HLD, HTN, DM   Additional history obtained:  Dr. Warren Danes PCP   Lab Tests:  I Ordered, and personally interpreted labs.  The pertinent results include:  - Troponin: near baseline at 47 down trending to 43 - CMP: BUN/Cr improved since last result but still elevated near baseline. - CBC: Mild anemia at 12.4.  No leukocytosis.   Imaging Studies ordered:  I ordered imaging studies including -chest xray: to assess for process contributing to patient's symptoms I independently visualized and interpreted imaging I agree with the radiologist interpretation   Cardiac Monitoring: / EKG:  The patient was maintained on a cardiac monitor.  I personally viewed and interpreted the cardiac monitored which showed an underlying rhythm of: No acute ST changes or arrythmias   Risk Stratification Score:  - Wells Score: 0   Problem List / ED Course / Critical interventions / Medication management  Patient presented for chest pain.  Patient with other intermittent vague symptoms such as lightheadedness, DOE, nausea.  Patient is overall well-appearing.  Patient is afebrile with stable vitals.  Patient ambulated in the ED with O2 >97% on room air.  Wells PE score 0.  Patient denying any other infectious symptoms today.  Patient was sent to ED by PCP concerned for his EKG. This EKG read "Acute MI" on the top but was reviewed by me and appears similar to past EKG's without acute ST changes or arrhythmias. Initial troponin near patient's baseline at 47 which is down trended to 43.  CMP with elevated BUN/creatinine at patient's baseline that is actually improved from last result.  CBC with mild anemia.  No leukocytosis.  Chest x-ray without acute cardiopulmonary disease.  EKG reassuring. Shared results with patient and recommend follow with PCP.  Patient verbalized understanding of plan. I have reviewed the patients home medicines and have made adjustments as needed Patient was given  return precautions. Patient stable for discharge at this time.  Patient verbalized understanding of plan.  Ddx:  These are considered less likely due to history of present illness and physical exam findings.  -Acute coronary syndrome: EKG and troponins within normal limits  -Congestive heart failure: patient denies orthopnea, cough, and leg edema -Pericarditis: pain is not positional and  patient denies orthopnea and recent illness -Pneumonia: lungs are clear to auscultation bilaterally -Pulmonary embolism: no recent surgeries, blood clot hx, hemoptysis, cancer hx, vitals stable -Pneumothorax: lungs are clear to auscultation bilaterally -Cardiac tamponade: chest xray reassuring   Social Determinants of Health:  none          Final Clinical Impression(s) / ED Diagnoses Final diagnoses:  Atypical chest pain    Rx / DC Orders ED Discharge Orders     None         Dorthy Cooler, [place] 11/28/22 1443    Glyn Ade, MD 11/29/22 [phone]

## 2022-11-28 NOTE — ED Notes (Signed)
Walked patient on pulse oxy patient did well patient is now back in bed on the monitor patient oxygen level went down to 97 on room air and back up to 99 room air patient stated that is was little SOB but not much

## 2022-11-28 NOTE — Discharge Instructions (Signed)
It was a pleasure caring for you today.  Workup today was reassuring.  Please follow-up with your primary care provider.  Seek emergency care if experiencing any new or worsening symptoms.

## 2022-11-30 ENCOUNTER — Encounter: Payer: Self-pay | Admitting: Urology

## 2022-12-07 ENCOUNTER — Ambulatory Visit: Payer: Medicare HMO | Admitting: Infectious Diseases

## 2022-12-07 NOTE — Progress Notes (Deleted)
Patient Active Problem List   Diagnosis Date Noted  . Acute respiratory failure with hypoxia (HCC) 05/27/2022  . AKI (acute kidney injury) (HCC) 05/23/2022  . CKD (chronic kidney disease) stage 5, GFR less than 15 ml/min (HCC) 04/24/2022  . Diabetic gastroparesis (HCC) 04/09/2022  . Chronic diastolic heart failure (HCC) 04/04/2022  . Anemia in chronic kidney disease (CKD) 10/18/2021  . BPH (benign prostatic hyperplasia) 10/16/2021  . Coronary artery disease 02/07/2021  . Erectile dysfunction associated with type 2 diabetes mellitus (HCC) 08/07/2019  . Vitreous hemorrhage of left eye (HCC) 07/22/2019  . Vision loss of left eye 07/22/2019  . History of medication noncompliance 04/02/2019  . Gastroesophageal reflux disease without esophagitis 09/04/2018  . Diabetic retinopathy of both eyes associated with type 2 diabetes mellitus (HCC) 01/03/2018  . Intermittent diarrhea 09/27/2017  . Macroalbuminuric diabetic nephropathy (HCC) 06/25/2017  . History of falling 06/25/2017  . HLD (hyperlipidemia) 03/21/2017  . Vitamin B 12 deficiency 03/21/2017  . DM2 (diabetes mellitus, type 2) (HCC) 02/05/2017  . Essential hypertension 02/05/2017  . Depression 02/05/2017  . Unintended weight loss 02/05/2017  . Gait disturbance 02/05/2017  . 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 Diabetic neuropathy, type II diabetes mellitus (HCC) 05/11/2014  . Metatarsal deformity 05/11/2014    Patient's Medications  New Prescriptions   No medications on file  Previous Medications   AMLODIPINE (NORVASC) 5 MG TABLET    Take 5 mg by mouth daily.   CALCIUM ACETATE (PHOSLO) 667 MG CAPSULE    Take 1,334 mg by mouth with breakfast, with lunch, and with evening meal.   FINASTERIDE (PROSCAR) 5 MG TABLET    Take 1 tablet (5 mg total) by mouth daily.   FOSFOMYCIN (MONUROL) 3 G PACK    Take 3 g by mouth See admin instructions. Every 2-3 days for 3 doses.   FUROSEMIDE (LASIX) 40 MG TABLET    Take 40  mg by mouth in the morning.   GABAPENTIN (NEURONTIN) 300 MG CAPSULE    Take 300 mg by mouth 3 (three) times a week. Tuesday, Thursday, Saturday after dialysis.   HYDRALAZINE (APRESOLINE) 25 MG TABLET    Take 25 mg by mouth 3 (three) times daily.   LANTUS SOLOSTAR 100 UNIT/ML SOLOSTAR PEN    Inject 5 Units into the skin at bedtime.   METOPROLOL SUCCINATE (TOPROL-XL) 25 MG 24 HR TABLET    Take 0.5 tablets (12.5 mg total) by mouth daily.   PIOGLITAZONE (ACTOS) 15 MG TABLET    Take 15 mg by mouth every morning.   ROSUVASTATIN (CRESTOR) 10 MG TABLET    Take 10 mg by mouth in the morning.   TAMSULOSIN (FLOMAX) 0.4 MG CAPS CAPSULE    Take 0.4 mg by mouth in the morning.   TYLENOL 500 MG TABLET    Take 500-1,000 mg by mouth every 6 (six) hours as needed for mild pain or headache.  Modified Medications   No medications on file  Discontinued Medications   No medications on file    Subjective: ***   Review of Systems: ROS  Past Medical History:  Diagnosis Date  . Anemia   . Anginal pain (HCC)   . Anxiety   . Arthritis   . BPH with obstruction/lower urinary tract symptoms   . CAD (coronary artery disease)    cardiologist--- dr Lucienne Minks badal;  11-06-2019 cardiac cath in [place] (result in care everywhere)  nonobstructive cad involing pRCA  60% (done in setting worseing CHF/ acute pulmonary edema requiring intubation/ AKI  . Chronic combined systolic and diastolic CHF (congestive heart failure) (HCC)   . Chronic kidney disease, stage IV (severe) [facility])    nephrologist--- dr Ronalee Belts  . Depression   . Diabetic neuropathy (HCC)   . Dyspnea   . Edema of both lower extremities   . Gastric ulcer without hemorrhage or perforation 12/25/2018  . GERD (gastroesophageal reflux disease)   . Heart murmur   . Hemodialysis patient (HCC)   . History of community acquired pneumonia    ad[facility] 06-04-2021 [place] [facility]  w/ ARF hypoxia w/ severe sepsis  . Hyperlipidemia   . Hypertension   . Insulin dependent  type 2 diabetes mellitus (HCC)   . LBBB (left bundle branch block)   . Pneumonia   . 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 Uses walker   . Vitamin B12 deficiency   . Vitreous hemorrhage (HCC)     Social History   Tobacco Use  . Smoking status: Never    Passive exposure: Never  . Smokeless tobacco: Never  Vaping Use  . Vaping status: Never Used  Substance Use Topics  . Alcohol use: No    Alcohol/week: 0.0 standard drinks of alcohol  . Drug use: No    Family History  Problem Relation Age of Onset  . Renal cancer Mother   . Hypertension Mother   . Pancreatic cancer Mother   . Hypertension Sister   . Stroke Sister   . Leukemia Maternal Uncle   . Sickle cell trait Maternal Aunt   . Colon cancer Neg Hx   . Esophageal cancer Neg Hx   . Rectal cancer Neg Hx   . Stomach cancer Neg Hx     Allergies  Allergen Reactions  . Claritin [Loratadine] Swelling and Other (See Comments)    Joint swelling   . Hydrochlorothiazide Other (See Comments)    Dizziness  . Latex Hives and Swelling  . Lyrica [Pregabalin] Other (See Comments)    Depression. "Makes me loopy"   . Metformin And Related Diarrhea and Nausea And Vomiting    Health Maintenance  Topic Date Due  . Medicare Annual Wellness (AWV)  Never done  . OPHTHALMOLOGY EXAM  Never done  . Zoster Vaccines- Shingrix (1 of 2) Never done  . FOOT EXAM  02/05/2018  . COVID-19 Vaccine (3 - Pfizer risk series) 06/22/2019  . INFLUENZA VACCINE  08/16/2022  . HEMOGLOBIN A1C  09/22/2022  . Colonoscopy  05/23/2023  . DTaP/Tdap/Td (2 - Td or Tdap) 03/22/2027  . Hepatitis C Screening  Completed  . HIV Screening  Completed  . HPV VACCINES  Aged Out    Objective:  There were no vitals filed for this visit. There is no height or weight on file to calculate BMI.  Physical Exam Constitutional:      Appearance: Normal appearance.  HENT:     Head: Normocephalic and atraumatic.      Mouth: Mucous membranes are moist.   Eyes:    Conjunctiva/sclera: Conjunctivae normal.     Pupils: Pupils are equal, round, and reactive to light.   Cardiovascular:     Rate and Rhythm: Normal rate and regular rhythm.     Heart sounds: No murmur heard. No friction rub. No gallop.   Pulmonary:     Effort: Pulmonary effort is normal.     Breath sounds: Normal breath sounds.   Abdominal:     General:  Non distended     Palpations: soft.   Musculoskeletal:        General: Normal range of motion.   Skin:    General: Skin is warm and dry.     Comments:  Neurological:     General: grossly non focal     Mental Status: awake, alert and oriented to person, place, and time.   Psychiatric:        Mood and Affect: Mood normal.   Lab Results Lab Results  Component Value Date   WBC 7.3 11/28/2022   HGB 12.4 (L) 11/28/2022   HCT 37.8 (L) 11/28/2022   MCV 95.5 11/28/2022   PLT 261 11/28/2022    Lab Results  Component Value Date   CREATININE 5.18 (H) 11/28/2022   BUN 25 (H) 11/28/2022   NA 133 (L) 11/28/2022   K 4.4 11/28/2022   CL 95 (L) 11/28/2022   CO2 23 11/28/2022    Lab Results  Component Value Date   ALT 16 11/28/2022   AST 27 11/28/2022   ALKPHOS 88 11/28/2022   BILITOT 0.5 11/28/2022    Lab Results  Component Value Date   CHOL 104 07/04/2021   HDL 37 (L) 07/04/2021   LDLCALC 61 07/04/2021   TRIG 28 07/04/2021   CHOLHDL 2.8 07/04/2021   No results found for: "LABRPR", "RPRTITER" No results found for: "HIV1RNAQUANT", "HIV1RNAVL", "CD4TABS"   Problem List Items Addressed This Visit   None   I have personally spent at least 60 minutes involved in face-to-face and non-face-to-face activities for this patient on the day of the visit. Professional time spent includes the following activities: Preparing to see the patient (review of tests), Obtaining and/or reviewing separately obtained history (admission/discharge record), Performing a medically appropriate examination and/or evaluation , Ordering  medications/tests/procedures, referring and communicating with other health care professionals, Documenting clinical information in the EMR, Independently interpreting results (not separately reported), Communicating results to the patient/family/caregiver, Counseling and educating the patient/family/caregiver and Care coordination (not separately reported).   Of note, portions of this note may have been created with voice recognition software. While this note has been edited for accuracy, occasional wrong-word or 'sound-a-like' substitutions may have occurred due to the inherent limitations of voice recognition software.   Victoriano Lain, MD [facility] 12/07/2022, 6:52 AM

## 2022-12-20 ENCOUNTER — Ambulatory Visit: Payer: Medicare Other | Admitting: Cardiovascular Disease

## 2022-12-26 ENCOUNTER — Other Ambulatory Visit: Payer: Self-pay

## 2022-12-26 ENCOUNTER — Ambulatory Visit: Payer: Medicare HMO | Admitting: Infectious Diseases

## 2022-12-26 ENCOUNTER — Encounter: Payer: Self-pay | Admitting: Infectious Diseases

## 2022-12-26 VITALS — BP 171/98 | HR 97 | Resp 16 | Ht 74.0 in | Wt 216.7 lb

## 2022-12-26 DIAGNOSIS — Z8619 Personal history of other infectious and parasitic diseases: Secondary | ICD-10-CM

## 2022-12-26 DIAGNOSIS — N390 Urinary tract infection, site not specified: Secondary | ICD-10-CM

## 2022-12-26 DIAGNOSIS — Z79899 Other long term (current) drug therapy: Secondary | ICD-10-CM | POA: Diagnosis not present

## 2022-12-26 NOTE — Progress Notes (Addendum)
Patient Active Problem List   Diagnosis Date Noted   Acute respiratory failure with hypoxia (HCC) 05/27/2022   AKI (acute kidney injury) (HCC) 05/23/2022   CKD (chronic kidney disease) stage 5, GFR less than 15 ml/min (HCC) 04/24/2022   Diabetic gastroparesis (HCC) 04/09/2022   Chronic diastolic heart failure (HCC) 04/04/2022   Anemia in chronic kidney disease (CKD) 10/18/2021   BPH (benign prostatic hyperplasia) 10/16/2021   Coronary artery disease 02/07/2021   Erectile dysfunction associated with type 2 diabetes mellitus (HCC) 08/07/2019   Vitreous hemorrhage of left eye (HCC) 07/22/2019   Vision loss of left eye 07/22/2019   History of medication noncompliance 04/02/2019   Gastroesophageal reflux disease without esophagitis 09/04/2018   Diabetic retinopathy of both eyes associated with type 2 diabetes mellitus (HCC) 01/03/2018   Intermittent diarrhea 09/27/2017   Macroalbuminuric diabetic nephropathy (HCC) 06/25/2017   History of falling 06/25/2017   HLD (hyperlipidemia) 03/21/2017   Vitamin B 12 deficiency 03/21/2017   DM2 (diabetes mellitus, type 2) (HCC) 02/05/2017   Essential hypertension 02/05/2017   Depression 02/05/2017   Unintended weight loss 02/05/2017   Gait disturbance 02/05/2017   Pronation deformity of both feet 05/11/2014   Diabetic neuropathy, type II diabetes mellitus (HCC) 05/11/2014   Metatarsal deformity 05/11/2014    Patient's Medications  New Prescriptions   No medications on file  Previous Medications   AMLODIPINE (NORVASC) 5 MG TABLET    Take 5 mg by mouth daily.   CALCIUM ACETATE (PHOSLO) 667 MG CAPSULE    Take 1,334 mg by mouth with breakfast, with lunch, and with evening meal.   FINASTERIDE (PROSCAR) 5 MG TABLET    Take 1 tablet (5 mg total) by mouth daily.   FOSFOMYCIN (MONUROL) 3 G PACK    Take 3 g by mouth See admin instructions. Every 2-3 days for 3 doses.   FUROSEMIDE (LASIX) 40 MG TABLET    Take 40 mg by mouth in the morning.    GABAPENTIN (NEURONTIN) 300 MG CAPSULE    Take 300 mg by mouth 3 (three) times a week. Tuesday, Thursday, Saturday after dialysis.   HYDRALAZINE (APRESOLINE) 25 MG TABLET    Take 25 mg by mouth 3 (three) times daily.   LANTUS SOLOSTAR 100 UNIT/ML SOLOSTAR PEN    Inject 5 Units into the skin at bedtime.   METOPROLOL SUCCINATE (TOPROL-XL) 25 MG 24 HR TABLET    Take 0.5 tablets (12.5 mg total) by mouth daily.   PIOGLITAZONE (ACTOS) 15 MG TABLET    Take 15 mg by mouth every morning.   ROSUVASTATIN (CRESTOR) 10 MG TABLET    Take 10 mg by mouth in the morning.   TAMSULOSIN (FLOMAX) 0.4 MG CAPS CAPSULE    Take 0.4 mg by mouth in the morning.   TYLENOL 500 MG TABLET    Take 500-1,000 mg by mouth every 6 (six) hours as needed for mild pain or headache.  Modified Medications   No medications on file  Discontinued Medications   No medications on file    Subjective: Discussed the use of AI scribe software for clinical note transcription with the patient, who gave verbal consent to proceed.  62 Y O Male with PMH of CAD, CHF, BPH s/p TURP, ESRD on HD, DM2 w neuropathy and retinopathy, HTN, HLD, Anxiety/Depression, GERD, colonization with ESBL Kleb pneumoniae who is referred from Urology Dr Wallace Cullens for recurrent UTI.   He reports that  UTIs began after a significant hospitalization in 2021,  during which he was on life support and subsequently had to relearn basic functions. It appears he had indwelling Foleys for a year in 2022. He describes the UTIs as causing pain during urination, though not consistently, and a strong, unpleasant odor in his urine. He denies frequent urination or urgency. He also reports neuropathy, which causes widespread pain , chronic back pajn, and balance issues requiring the use of a walking aid. He has ESRD and started on HD which he started in May, and he reports nausea and bloating, which he attributes to the dialysis. He was advised to take over-the-counter medications for these symptoms  but has not started them yet. He also takes imodium for diarrhea.  He denies any current symptoms of a UTI like burning urination, increased frequency, suprapubic pain or flank pain. Denies fevers and chills.   Per outside records. Last note from Urology reviewed ( media) 08/24/22 urine cx Probable ESBL Kleb pneumoniae 08/30/2022 urine cx Greater than 100k Klebsiella oxytoca 10/09/22 cytopathology negative for high grade for urothelial carcinoma, numerous acute inflammatory cells with scattered rare reactive urothelial cells.  10/09/22 urine cx greater than 100k ESBL Kleb pneumoniae  Last seen by Urology 11/15/22 when he was treated for UTI with gentamicin injection and fosfomycin 3 doses. No urine cx.   Review of Systems: all systems reviewed including GU and negative except as above  Past Medical History:  Diagnosis Date   Anemia    Anginal pain (HCC)    Anxiety    Arthritis    BPH with obstruction/lower urinary tract symptoms    CAD (coronary artery disease)    cardiologist--- dr Lucienne Minks badal;  11-06-2019 cardiac cath in [place] (result in care everywhere)  nonobstructive cad involing pRCA 60% (done in setting worseing CHF/ acute pulmonary edema requiring intubation/ AKI   Chronic combined systolic and diastolic CHF (congestive heart failure) (HCC)    Chronic kidney disease, stage IV (severe) (HCC)    nephrologist--- dr Ronalee Belts   Depression    Diabetic neuropathy (HCC)    Dyspnea    Edema of both lower extremities    Gastric ulcer without hemorrhage or perforation 12/25/2018   GERD (gastroesophageal reflux disease)    Heart murmur    Hemodialysis patient (HCC)    History of community acquired pneumonia    ad[facility]  w/ [facility]F hypoxia w/ severe sepsis   Hyperlipidemia    Hypertension    Insulin dependent type 2 diabetes mellitus (HCC)    LBBB (left bundle branch block)    Pneumonia    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)    Uses walker    Vitamin B12  deficiency    Vitreous hemorrhage [facility])    Past Surgical History:  Procedure Laterality Date   A/V FISTULAGRAM Right 07/17/2022   Procedure: A/V Fistulagram;  Surgeon: Chuck Hint, MD;  Location: [facility] INVASIVE CV LAB;  Service: Vascular;  Laterality: Right;   AV FISTULA PLACEMENT Right 05/24/2022   Procedure: RADIOCEPHALIC ARTERIOVENOUS (AV) FISTULA CREATION;  Surgeon: Victorino Sparrow, MD;  Location: [facility] OR;  Service: Vascular;  Laterality: Right;   CARDIAC CATHETERIZATION  11/06/2019   [facility] in [place];    nonobstructive CAD , pRCA 60% (result in care everywhere)   CATARACT EXTRACTION W/ INTRAOCULAR LENS IMPLANT Bilateral 2017   IR FLUORO GUIDE CV LINE RIGHT  05/23/2022   IR US GUIDE VASC ACCESS RIGHT  05/23/2022   PATCH ANGIOPLASTY Right 09/18/2022   Procedure: BOVINE PATCH  ANGIOPLASTY 1CM x 6CM;  Surgeon: Chuck Hint, MD;  Location: [facility] OR;  Service: Vascular;  Laterality: Right;   REVISON OF ARTERIOVENOUS FISTULA Right 09/18/2022   Procedure: REVISON OF RIGHT UPPER ARM ARTERIOVENOUS FISTULA;  Surgeon: Chuck Hint, MD;  Location: [facility] OR;  Service: Vascular;  Laterality: Right;   TRANSURETHRAL RESECTION OF PROSTATE N/A 10/16/2021   Procedure: TRANSURETHRAL RESECTION OF THE PROSTATE (TURP);  Surgeon: Jannifer Hick, MD;  Location: [facility] ORS;  Service: Urology;  Laterality: N/A;  '  Social History   Tobacco Use   Smoking status: Never    Passive exposure: Never   Smokeless tobacco: Never  Vaping Use   Vaping status: Never Used  Substance Use Topics   Alcohol use: No    Alcohol/week: 0.0 standard drinks of alcohol   Drug use: No    Family History  Problem Relation Age of Onset   Renal cancer Mother    Hypertension Mother    Pancreatic cancer Mother    Hypertension Sister    Stroke Sister    Leukemia Maternal Uncle    Sickle cell trait Maternal Aunt    Colon cancer Neg Hx    Esophageal cancer Neg Hx    Rectal cancer Neg Hx    Stomach  cancer Neg Hx     Allergies  Allergen Reactions   Claritin [Loratadine] Swelling and Other (See Comments)    Joint swelling    Hydrochlorothiazide Other (See Comments)    Dizziness   Latex Hives and Swelling   Lyrica [Pregabalin] Other (See Comments)    Depression. "Makes me loopy"    Metformin And Related Diarrhea and Nausea And Vomiting    Health Maintenance  Topic Date Due   Medicare Annual Wellness (AWV)  Never done   OPHTHALMOLOGY EXAM  Never done   Zoster Vaccines- Shingrix (1 of 2) Never done   FOOT EXAM  02/05/2018   COVID-19 Vaccine (3 - Pfizer risk series) 06/22/2019   INFLUENZA VACCINE  08/16/2022   HEMOGLOBIN A1C  09/22/2022   Colonoscopy  05/23/2023   DTaP/Tdap/Td (2 - Td or Tdap) 03/22/2027   Hepatitis C Screening  Completed   HIV Screening  Completed   HPV VACCINES  Aged Out    Objective: BP (!) 171/98   Pulse 97   Resp 16   Ht 6\' 2"  (1.88 m)   Wt 216 lb 11.2 oz (98.3 kg)   BMI 27.82 kg/m    Physical Exam Constitutional:      Appearance: Normal appearance.  HENT:     Head: Normocephalic and atraumatic.      Mouth: Mucous membranes are moist.  Eyes:    Conjunctiva/sclera: Conjunctivae normal.     Pupils: Pupils are equal, round, and bilaterally symmetrical  Cardiovascular:     Rate and Rhythm: Normal rate and regular rhythm.     Heart sounds: s1s2  Pulmonary:     Effort: Pulmonary effort is normal.     Breath sounds: Normal breath sounds.   Abdominal:     General: Non distended     Palpations: soft.   Musculoskeletal:        General: Normal range of motion.   Skin:    General: Skin is warm and dry.     Comments:  Neurological:     General: grossly non focal     Mental Status: awake, alert and oriented to person, place, and time.   Psychiatric:        Mood  and Affect: Mood normal.   Lab Results Lab Results  Component Value Date   WBC 7.3 11/28/2022   HGB 12.4 (L) 11/28/2022   HCT 37.8 (L) 11/28/2022   MCV 95.5 11/28/2022    PLT 261 11/28/2022    Lab Results  Component Value Date   CREATININE 5.18 (H) 11/28/2022   BUN 25 (H) 11/28/2022   NA 133 (L) 11/28/2022   K 4.4 11/28/2022   CL 95 (L) 11/28/2022   CO2 23 11/28/2022    Lab Results  Component Value Date   ALT 16 11/28/2022   AST 27 11/28/2022   ALKPHOS 88 11/28/2022   BILITOT 0.5 11/28/2022    Lab Results  Component Value Date   CHOL 104 07/04/2021   HDL 37 (L) 07/04/2021   LDLCALC 61 07/04/2021   TRIG 28 07/04/2021   CHOLHDL 2.8 07/04/2021   No results found for: "LABRPR", "RPRTITER" No results found for: "HIV1RNAQUANT", "HIV1RNAVL", "CD4TABS"   Assessment/plan # Recurrent UTI  # Colonization with ESBL Kleb pneumoniae CT 04/04/22 with no obstructive abnormalities or kidney stones   Plan  - discussed at length regarding signs and symptoms of UTI and poor smell by itself in absence of other GU symptoms does not indicate UTI  - No indication of abtx currently as no active symptoms  - discussed adequate water intake  - Do not think he will benefit from antibiotic prophylaxis currently and will defer - Fu as needed   # BPH - fu with Urology as instructed   I have personally spent 63 minutes involved in face-to-face and non-face-to-face activities for this patient on the day of the visit. Professional time spent includes the following activities: Preparing to see the patient (review of tests), Obtaining and/or reviewing separately obtained history (admission/discharge record), Performing a medically appropriate examination and/or evaluation , Ordering medications/tests/procedures, referring and communicating with other health care professionals, Documenting clinical information in the EMR, Independently interpreting results (not separately reported), Communicating results to the patient/family/caregiver, Counseling and educating the patient/family/caregiver and Care coordination (not separately reported).   Of note, portions of this note may  have been created with voice recognition software. While this note has been edited for accuracy, occasional wrong-word or 'sound-a-like' substitutions may have occurred due to the inherent limitations of voice recognition software.   Victoriano Lain, MD [facility] 12/26/2022, 12:57 PM

## 2023-01-03 DIAGNOSIS — Z8619 Personal history of other infectious and parasitic diseases: Secondary | ICD-10-CM | POA: Insufficient documentation

## 2023-01-15 ENCOUNTER — Telehealth: Payer: Self-pay

## 2023-01-15 NOTE — Telephone Encounter (Signed)
 Copied from CRM [id number]. Topic: General - Other >> Jan 15, 2023 10:06 AM Antony Haste wrote: Reason for CRM: PT would like to have a blood-transfusion completed for today if possible.   Callback #[phone]

## 2023-01-17 ENCOUNTER — Telehealth: Payer: Self-pay

## 2023-01-17 NOTE — Telephone Encounter (Signed)
 Copied from CRM [id number]. Topic: General - Call Back - No Documentation >> Jan 15, 2023  1:40 PM Bascom S wrote: Reason for CRM: Patient returning a call from [facility] regarding a transfusion. Patient advised office has closed. Patient's callback number is [phone].

## 2023-01-18 ENCOUNTER — Non-Acute Institutional Stay (HOSPITAL_COMMUNITY)
Admission: RE | Admit: 2023-01-18 | Discharge: 2023-01-18 | Disposition: A | Discharge status: Home / Self Care | Payer: Medicare HMO | Source: Ambulatory Visit | Attending: Internal Medicine | Admitting: Internal Medicine

## 2023-01-18 VITALS — BP 161/71 | HR 74 | Temp 98.5°F | Resp 16

## 2023-01-18 DIAGNOSIS — N185 Chronic kidney disease, stage 5: Secondary | ICD-10-CM | POA: Diagnosis present

## 2023-01-18 LAB — PREPARE RBC (CROSSMATCH)

## 2023-01-18 MED ORDER — SODIUM CHLORIDE 0.9% IV SOLUTION
Freq: Once | INTRAVENOUS | Status: AC
Start: 2023-01-18 — End: 2023-01-18

## 2023-01-18 NOTE — Progress Notes (Signed)
 PATIENT CARE CENTER NOTE  Diagnosis: Chronic Kidney Disease   Provider: Delores Levan NP  Procedure: 1 unit PRBCS  Note: Patient received 1 unit PRBCS via PIV. No pre-medications per orders. Blood consent obtained prior to transfusion. Pt tolerated transfusion with no adverse reaction. AVS printed and given to pt. Pt is alert, oriented, and ambulatory at discharge with personal walker.

## 2023-01-21 LAB — BPAM RBC
Blood Product Expiration Date: 202501252359
ISSUE DATE / TIME: 202501031037
Unit Type and Rh: 7300

## 2023-01-21 LAB — TYPE AND SCREEN
ABO/RH(D): B POS
Antibody Screen: NEGATIVE
Unit division: 0

## 2023-01-28 ENCOUNTER — Ambulatory Visit (HOSPITAL_COMMUNITY): Admission: RE | Admit: 2023-01-28 | Payer: Medicare HMO | Source: Ambulatory Visit | Admitting: Nephrology

## 2023-01-28 ENCOUNTER — Encounter (HOSPITAL_COMMUNITY): Admission: RE | Payer: Self-pay | Source: Ambulatory Visit

## 2023-01-28 SURGERY — A/V FISTULAGRAM
Anesthesia: LOCAL

## 2023-02-01 ENCOUNTER — Other Ambulatory Visit: Payer: Self-pay

## 2023-02-01 ENCOUNTER — Ambulatory Visit (HOSPITAL_COMMUNITY)
Admission: RE | Admit: 2023-02-01 | Discharge: 2023-02-01 | Disposition: A | Discharge status: Home / Self Care | Payer: Medicare HMO | Source: Ambulatory Visit | Attending: Nephrology | Admitting: Nephrology

## 2023-02-01 ENCOUNTER — Encounter (HOSPITAL_COMMUNITY)
Admission: RE | Disposition: A | Discharge status: Home / Self Care | Payer: Self-pay | Source: Ambulatory Visit | Attending: Nephrology

## 2023-02-01 DIAGNOSIS — E1122 Type 2 diabetes mellitus with diabetic chronic kidney disease: Secondary | ICD-10-CM | POA: Diagnosis not present

## 2023-02-01 DIAGNOSIS — I5042 Chronic combined systolic (congestive) and diastolic (congestive) heart failure: Secondary | ICD-10-CM | POA: Diagnosis not present

## 2023-02-01 DIAGNOSIS — I132 Hypertensive heart and chronic kidney disease with heart failure and with stage 5 chronic kidney disease, or end stage renal disease: Secondary | ICD-10-CM | POA: Diagnosis not present

## 2023-02-01 DIAGNOSIS — T82858A Stenosis of vascular prosthetic devices, implants and grafts, initial encounter: Secondary | ICD-10-CM | POA: Diagnosis present

## 2023-02-01 DIAGNOSIS — Z794 Long term (current) use of insulin: Secondary | ICD-10-CM | POA: Diagnosis not present

## 2023-02-01 DIAGNOSIS — Y832 Surgical operation with anastomosis, bypass or graft as the cause of abnormal reaction of the patient, or of later complication, without mention of misadventure at the time of the procedure: Secondary | ICD-10-CM | POA: Insufficient documentation

## 2023-02-01 DIAGNOSIS — N186 End stage renal disease: Secondary | ICD-10-CM | POA: Insufficient documentation

## 2023-02-01 DIAGNOSIS — Z992 Dependence on renal dialysis: Secondary | ICD-10-CM | POA: Diagnosis not present

## 2023-02-01 DIAGNOSIS — Z7984 Long term (current) use of oral hypoglycemic drugs: Secondary | ICD-10-CM | POA: Diagnosis not present

## 2023-02-01 DIAGNOSIS — Z8249 Family history of ischemic heart disease and other diseases of the circulatory system: Secondary | ICD-10-CM | POA: Diagnosis not present

## 2023-02-01 HISTORY — PX: PERIPHERAL VASCULAR BALLOON ANGIOPLASTY: CATH118281

## 2023-02-01 HISTORY — PX: A/V FISTULAGRAM: CATH118298

## 2023-02-01 SURGERY — A/V FISTULAGRAM
Anesthesia: LOCAL | Laterality: Right

## 2023-02-01 MED ORDER — LIDOCAINE HCL (PF) 1 % IJ SOLN
INTRAMUSCULAR | Status: AC
  Filled 2023-02-01: qty 30

## 2023-02-01 MED ORDER — IODIXANOL 320 MG/ML IV SOLN
INTRAVENOUS | Status: DC | PRN
  Administered 2023-02-01: 15 mL via INTRAVENOUS

## 2023-02-01 MED ORDER — FENTANYL CITRATE (PF) 100 MCG/2ML IJ SOLN
INTRAMUSCULAR | Status: DC | PRN
  Administered 2023-02-01: 25 ug via INTRAVENOUS

## 2023-02-01 MED ORDER — ACETAMINOPHEN 325 MG PO TABS: 650.0000 mg | ORAL_TABLET | ORAL | Status: DC | PRN

## 2023-02-01 MED ORDER — ONDANSETRON HCL 4 MG/2ML IJ SOLN: 4.0000 mg | Freq: Four times a day (QID) | INTRAMUSCULAR | Status: DC | PRN

## 2023-02-01 MED ORDER — MIDAZOLAM HCL 2 MG/2ML IJ SOLN
INTRAMUSCULAR | Status: DC | PRN
  Administered 2023-02-01: 2 mg via INTRAVENOUS

## 2023-02-01 MED ORDER — HEPARIN (PORCINE) IN NACL 1000-0.9 UT/500ML-% IV SOLN
INTRAVENOUS | Status: DC | PRN
  Administered 2023-02-01: 500 mL

## 2023-02-01 MED ORDER — FENTANYL CITRATE (PF) 100 MCG/2ML IJ SOLN
INTRAMUSCULAR | Status: AC
  Filled 2023-02-01: qty 2

## 2023-02-01 MED ORDER — LIDOCAINE HCL (PF) 1 % IJ SOLN
INTRAMUSCULAR | Status: DC | PRN
  Administered 2023-02-01: 2 mL via INTRADERMAL

## 2023-02-01 MED ORDER — MIDAZOLAM HCL 2 MG/2ML IJ SOLN
INTRAMUSCULAR | Status: AC
  Filled 2023-02-01: qty 2

## 2023-02-01 MED ORDER — SODIUM CHLORIDE 0.9 % IV SOLN: INTRAVENOUS | Status: DC

## 2023-02-01 MED ORDER — SODIUM CHLORIDE 0.9% FLUSH: 3.0000 mL | INTRAVENOUS | Status: DC | PRN

## 2023-02-01 SURGICAL SUPPLY — 12 items
BAG SNAP BAND KOVER 36X36 (MISCELLANEOUS) ×2 IMPLANT
BALLN MUSTANG 6.0X40 75 (BALLOONS) ×2
BALLOON MUSTANG 6.0X40 75 (BALLOONS) IMPLANT
CATH BEACON 5 .035 65 KMP TIP (CATHETERS) IMPLANT
COVER DOME SNAP 22 D (MISCELLANEOUS) ×2 IMPLANT
GUIDEWIRE ANGLED .035X150CM (WIRE) IMPLANT
KIT MICROPUNCTURE NIT STIFF (SHEATH) IMPLANT
SHEATH PINNACLE R/O II 6F 4CM (SHEATH) IMPLANT
SHEATH PROBE COVER 6X72 (BAG) IMPLANT
SYR MEDALLION 10ML (SYRINGE) IMPLANT
TRAY PV CATH (CUSTOM PROCEDURE TRAY) ×2 IMPLANT
WIRE BENTSON .035X145CM (WIRE) IMPLANT

## 2023-02-01 NOTE — H&P (Signed)
Ryan Jenkins is an 63 y.o. male.   Chief Complaint: Cannulation difficulties of right radiocephalic fistula HPI:  63 year old man with past medical history significant for coronary artery disease, combined systolic/diastolic heart failure, hypertension, insulin-dependent type 2 diabetes mellitus complicated by retinopathy and end-stage renal disease on hemodialysis.  He had a right arm radiocephalic fistula created on 05/24/2022 that was revised on 10/15/2022 because of poor maturation with ligation of branches and bovine pericardial patch angioplasty of proximal vein stenosis by Dr. Edilia Jenkins.  He has had continued problems with cannulation difficulties over the past few months and usually has 1 needle cannulating the fistula and 1 port attached to his right IJ TDC.  He complains of right upper arm/shoulder pain and inquires if this is related to his catheter or fistula.  He denies any constitutional complaints such as fever or chills.  The angiogram/angioplasty procedure was explained to him and is willing to proceed to evaluate for cannulation difficulties.  Past Medical History:  Diagnosis Date   Anemia    Anginal pain (HCC)    Anxiety    Arthritis    BPH with obstruction/lower urinary tract symptoms    CAD (coronary artery disease)    cardiologist--- dr Ryan Jenkins;  11-06-2019 cardiac cath in [place] (result in care everywhere)  nonobstructive cad involing pRCA 60% (done in setting worseing CHF/ acute pulmonary edema requiring intubation/ AKI   Chronic combined systolic and diastolic CHF (congestive heart failure) (HCC)    Chronic kidney disease, stage IV (severe) (HCC)    nephrologist--- dr Ryan Jenkins   Depression    Diabetic neuropathy (HCC)    Dyspnea    Edema of both lower extremities    Gastric ulcer without hemorrhage or perforation 12/25/2018   GERD (gastroesophageal reflux disease)    Heart murmur    Hemodialysis patient (HCC)    History of community acquired pneumonia     ad[facility]04-20[facility]  w/ [facility]F hypoxia w/ severe sepsis   Hyperlipidemia    Hypertension    Insulin dependent type 2 diabetes mellitus (HCC)    LBBB (left bundle branch block)    Pneumonia    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)    Uses walker    Vitamin B12 deficiency    Vitreous hemorrhage [facility])     Past Surgical History:  Procedure Laterality Date   A/V FISTULAGRAM Right 07/17/2022   Procedure: A/V Fistulagram;  Surgeon: Ryan Hint, MD;  Location: [facility] INVASIVE CV LAB;  Service: Vascular;  Laterality: Right;   AV FISTULA PLACEMENT Right 05/24/2022   Procedure: RADIOCEPHALIC ARTERIOVENOUS (AV) FISTULA CREATION;  Surgeon: Ryan Sparrow, MD;  Location: [facility] OR;  Service: Vascular;  Laterality: Right;   CARDIAC CATHETERIZATION  11/06/2019   [facility] in [place];    nonobstructive CAD , pRCA 60% (result in care everywhere)   CATARACT EXTRACTION W/ INTRAOCULAR LENS IMPLANT Bilateral 2017   IR FLUORO GUIDE CV LINE RIGHT  05/23/2022   IR US GUIDE VASC ACCESS RIGHT  05/23/2022   PATCH ANGIOPLASTY Right 09/18/2022   Procedure: BOVINE PATCH ANGIOPLASTY 1CM x 6CM;  Surgeon: Ryan Hint, MD;  Location: [facility] OR;  Service: Vascular;  Laterality: Right;   REVISON OF ARTERIOVENOUS FISTULA Right 09/18/2022   Procedure: REVISON OF RIGHT UPPER ARM ARTERIOVENOUS FISTULA;  Surgeon: Ryan Hint, MD;  Location: [facility] OR;  Service: Vascular;  Laterality: Right;   TRANSURETHRAL RESECTION OF PROSTATE N/A 10/16/2021   Procedure: TRANSURETHRAL RESECTION OF THE PROSTATE (  TURP);  Surgeon: Ryan Hick, MD;  Location: [facility] ORS;  Service: Urology;  Laterality: N/A;    Family History  Problem Relation Age of Onset   Renal cancer Mother    Hypertension Mother    Pancreatic cancer Mother    Hypertension Sister    Stroke Sister    Leukemia Maternal Uncle    Sickle cell trait Maternal Aunt    Colon cancer Neg Hx    Esophageal cancer Neg Hx    Rectal cancer Neg Hx     Stomach cancer Neg Hx    Social History:  reports that he has never smoked. He has never been exposed to tobacco smoke. He has never used smokeless tobacco. He reports that he does not drink alcohol and does not use drugs.  Allergies:  Allergies  Allergen Reactions   Claritin [Loratadine] Swelling and Other (See Comments)    Joint swelling    Hydrochlorothiazide Other (See Comments)    Dizziness   Latex Hives and Swelling   Lyrica [Pregabalin] Other (See Comments)    Depression. "Makes me loopy"    Metformin And Related Diarrhea and Nausea And Vomiting    Medications Prior to Admission  Medication Sig Dispense Refill   amLODipine (NORVASC) 5 MG tablet Take 5 mg by mouth daily.     calcium acetate (PHOSLO) 667 MG capsule Take 1,334 mg by mouth with breakfast, with lunch, and with evening meal.     famotidine (PEPCID) 40 MG tablet Take 40 mg by mouth daily as needed for heartburn or indigestion.     finasteride (PROSCAR) 5 MG tablet Take 1 tablet (5 mg total) by mouth daily. (Patient taking differently: Take 5 mg by mouth every evening.) 30 tablet 2   furosemide (LASIX) 40 MG tablet Take 40 mg by mouth in the morning.     gabapentin (NEURONTIN) 300 MG capsule Take 300 mg by mouth Every Tuesday,Thursday,and Saturday with dialysis. Tuesday, Thursday, Saturday after dialysis.     hydrALAZINE (APRESOLINE) 25 MG tablet Take 25 mg by mouth 3 (three) times daily.     LANTUS SOLOSTAR 100 UNIT/ML Solostar Pen Inject 5 Units into the skin at bedtime. (Patient taking differently: Inject 15 Units into the skin at bedtime.)     meclizine (ANTIVERT) 25 MG tablet Take 25 mg by mouth daily.     metoprolol succinate (TOPROL-XL) 25 MG 24 hr tablet Take 0.5 tablets (12.5 mg total) by mouth daily. (Patient taking differently: Take 12.5 mg by mouth every evening.) 15 tablet 2   pioglitazone (ACTOS) 15 MG tablet Take 15 mg by mouth every morning.     polyethylene glycol (MIRALAX / GLYCOLAX) 17 g packet Take  17 g by mouth daily.     rosuvastatin (CRESTOR) 10 MG tablet Take 10 mg by mouth in the morning.     tamsulosin (FLOMAX) 0.4 MG CAPS capsule Take 0.4 mg by mouth in the morning.     TYLENOL 500 MG tablet Take 500-1,000 mg by mouth every 6 (six) hours as needed (pain/headaches.).      No results found for this or any previous visit (from the past 48 hours). No results found.  Review of Systems  Musculoskeletal:  Positive for arthralgias and myalgias.       Right shoulder  All other systems reviewed and are negative.   Blood pressure (!) 155/77, pulse 82, resp. rate 14, weight 98.4 kg, SpO2 98%. Physical Exam Vitals and nursing note reviewed.  Constitutional:  Appearance: Normal appearance. He is normal weight.  HENT:     Head: Normocephalic and atraumatic.     Right Ear: External ear normal.     Left Ear: External ear normal.     Nose: Nose normal.     Mouth/Throat:     Mouth: Mucous membranes are dry.     Pharynx: Oropharynx is clear.  Eyes:     Extraocular Movements: Extraocular movements intact.     Conjunctiva/sclera: Conjunctivae normal.  Cardiovascular:     Rate and Rhythm: Normal rate and regular rhythm.     Pulses: Normal pulses.     Heart sounds: Normal heart sounds.  Pulmonary:     Effort: Pulmonary effort is normal.     Breath sounds: Normal breath sounds. No wheezing or rales.     Comments: Right IJ TDC Abdominal:     General: Abdomen is flat. Bowel sounds are normal.     Palpations: Abdomen is soft.  Musculoskeletal:     Cervical back: Normal range of motion and neck supple.     Right lower leg: No edema.     Left lower leg: No edema.     Comments: Right arm fistula with poorly palpable outflow thrill.  High-pitched bruit at inflow.  Skin:    General: Skin is warm and dry.  Neurological:     Mental Status: He is alert.      Assessment/Plan 1.  Problems with arteriovenous fistula: Having continued problems with cannulation difficulty of his right  upper arm fistula in spite of revision.  Will undertake fistulogram for evaluation and possible angioplasty today. 2.  End-stage renal disease: Continue hemodialysis on TTS schedule. 3.  Hypertension: Will monitor blood pressures through treatment to decide on need for possible intervention.  Resume antihypertensive therapy as an outpatient. 4.  Right shoulder pain: This does not appear to be related to his dialysis catheter or his arteriovenous fistula.  Recommend evaluation/management for musculoskeletal etiology.  Dagoberto Ligas, MD 02/01/2023, 9:05 AM

## 2023-02-01 NOTE — Op Note (Signed)
Patient presents for concerns of difficult cannulation in his right BCF which was created on 05/24/2022 and revised on 10/15/2022 by Dr. Edilia Bo with ligation of accessory branches and bovine pericardial patch angioplasty of proximal stenosis.  He has had problems with cannulation of the venous needle for the past several weeks and only 1 needle can be used in the fistula.  On the physical exam, the fistula is hyperpulsatile at the inflow with high-pitched inflow bruit and poor thrill in the outflow. He has a right internal jugular tunneled catheter present.  Summary:  1)      The patient had successful angioplasty (6 mm Mustang FE ~16 atm) of significant 80% stenosis in the inflow cephalic vein and 60-70% stenosis in the juxta arterial anastomosis.  2)      Flows improved after outflow angioplasty.  The fistula is suboptimally developed and ranges between 6 to 7 mm in the entire outflow with a patent cephalic arch and right sided central veins with an ipsilateral right IJ TDC. 3)      this fistula remains amenable to limited percutaneous interventions.  Description of procedure: The arm was prepped and draped in the usual sterile fashion. The right brachiocephalic fistula was cannulated (16109) with an 18G Angiocath needle directed in a retrograde direction in venous limb of the fistula. A guidewire was inserted and exchanged for a 6 Fr sheath. Contrast [phone]) injection via the side port of the sheath was performed. The angiogram of the fistula (09811) showed a patent but poorly developed outflow cephalic vein, cephalic arch and right centrals with an ipsilateral right IJ TDC in place. There was a 70% inflow juxta arterial anastomosis stenosis and 80% inflow cephalic vein stenosis.  The angled Glidewire was advanced and manipulated until the tip of the wire was in the proximal brachial artery.  Arteriogram revealed a patent brachial artery, arterial anastomosis with a 70% juxta anastomotic and 80% inflow  cephalic vein stenosis. A 6 x 4 Mustang angioplasty balloon was then inserted over the guidewire and positioned first at the cephalic vein inflow stenosis and then advanced to the juxta anastomosis stenosis.   Venous angioplasty (91478) was carried out to 16 ATM with FULL effacement of the waist on the balloon at both the sites of stenosis. The repeat angiogram showed 10% residual stenosis with no evidence of extravasation or dissection.  Hemostasis: A 3-0 ethilon purse string suture was placed at the cannulation site on removal of the sheath.  Sedation: 2mg  Versed, Fentanyl. Sedation time. 16 minutes  Contrast. 20 mL  Monitoring: Because of the patient's comorbid conditions and sedation during the procedure, continuous EKG monitoring and O2 saturation monitoring was performed throughout the procedure by the RN. There were no abnormal arrhythmias encountered.  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  Please resume fistula cannulation in 3 weeks-starting on 02/25/2023 with 17G needles 2. Remove the suture next treatment.   Discharge: The patient was discharged home in stable condition. The patient was given education regarding the care of the dialysis access AVF and specific instructions in case of any problems.

## 2023-02-01 NOTE — Discharge Instructions (Addendum)
Okay to discharge home anytime after 11:20 AM as long as he is clinically stable  General care instructions: - Do not drive or operate heavy machinery for 24hrs - Avoid making any important decisions for the remainder of the day. - You should be able to eat, drink, and resume your normal medications. - Avoid any strenuous activity for the remainder of the day. Potential complications: - Your hand is more cold or numb than usual. - You are bleeding at the site and it will not stop with direct pressure. If it was a declot expect some oozing at the site. Avoid extreme pressure to the site. - You have a change in the bruit and /or thrill in your fistula or graft. - You have a fever, swelling, see redness or feel heat at or near the puncture site. Medication instructions: - Continue routine medications unless otherwise instructed. 4. Please have your sutures removed at your next scheduled dialysis treatment.

## 2023-02-04 ENCOUNTER — Encounter (HOSPITAL_COMMUNITY): Payer: Self-pay | Admitting: Nephrology

## 2023-03-28 ENCOUNTER — Emergency Department (HOSPITAL_COMMUNITY)

## 2023-03-28 ENCOUNTER — Other Ambulatory Visit: Payer: Self-pay

## 2023-03-28 ENCOUNTER — Emergency Department (HOSPITAL_COMMUNITY)
Admission: EM | Admit: 2023-03-28 | Discharge: 2023-03-28 | Disposition: A | Discharge status: Home / Self Care | Attending: Emergency Medicine | Admitting: Emergency Medicine

## 2023-03-28 ENCOUNTER — Encounter (HOSPITAL_COMMUNITY): Payer: Self-pay | Admitting: Emergency Medicine

## 2023-03-28 DIAGNOSIS — Z992 Dependence on renal dialysis: Secondary | ICD-10-CM | POA: Insufficient documentation

## 2023-03-28 DIAGNOSIS — Z794 Long term (current) use of insulin: Secondary | ICD-10-CM | POA: Diagnosis not present

## 2023-03-28 DIAGNOSIS — Z91040 Latex allergy status: Secondary | ICD-10-CM | POA: Diagnosis not present

## 2023-03-28 DIAGNOSIS — R519 Headache, unspecified: Secondary | ICD-10-CM | POA: Diagnosis not present

## 2023-03-28 DIAGNOSIS — R079 Chest pain, unspecified: Secondary | ICD-10-CM | POA: Insufficient documentation

## 2023-03-28 DIAGNOSIS — E119 Type 2 diabetes mellitus without complications: Secondary | ICD-10-CM | POA: Insufficient documentation

## 2023-03-28 LAB — CBC
HCT: 34.1 % — ABNORMAL LOW (ref 39.0–52.0)
Hemoglobin: 11.9 g/dL — ABNORMAL LOW (ref 13.0–17.0)
MCH: 31.1 pg (ref 26.0–34.0)
MCHC: 34.9 g/dL (ref 30.0–36.0)
MCV: 89 fL (ref 80.0–100.0)
Platelets: 304 10*3/uL (ref 150–400)
RBC: 3.83 MIL/uL — ABNORMAL LOW (ref 4.22–5.81)
RDW: 14.5 % (ref 11.5–15.5)
WBC: 7.5 10*3/uL (ref 4.0–10.5)
nRBC: 0 % (ref 0.0–0.2)

## 2023-03-28 LAB — BASIC METABOLIC PANEL
Anion gap: 15 (ref 5–15)
BUN: 24 mg/dL — ABNORMAL HIGH (ref 8–23)
CO2: 24 mmol/L (ref 22–32)
Calcium: 9.2 mg/dL (ref 8.9–10.3)
Chloride: 96 mmol/L — ABNORMAL LOW (ref 98–111)
Creatinine, Ser: 4.39 mg/dL — ABNORMAL HIGH (ref 0.61–1.24)
GFR, Estimated: 14 mL/min — ABNORMAL LOW (ref >60)
Glucose, Bld: 401 mg/dL — ABNORMAL HIGH (ref 70–99)
Potassium: 3.8 mmol/L (ref 3.5–5.1)
Sodium: 135 mmol/L (ref 135–145)

## 2023-03-28 LAB — TROPONIN I (HIGH SENSITIVITY)
Troponin I (High Sensitivity): 36 ng/L — ABNORMAL HIGH (ref <18)
Troponin I (High Sensitivity): 37 ng/L — ABNORMAL HIGH (ref <18)

## 2023-03-28 LAB — CBG MONITORING, ED: Glucose-Capillary: 286 mg/dL — ABNORMAL HIGH (ref 70–99)

## 2023-03-28 MED ORDER — ACETAMINOPHEN 325 MG PO TABS
650.0000 mg | ORAL_TABLET | Freq: Four times a day (QID) | ORAL | Status: DC | PRN
  Administered 2023-03-28: 650 mg via ORAL
  Filled 2023-03-28: qty 2

## 2023-03-28 MED ORDER — INSULIN ASPART 100 UNIT/ML IJ SOLN
5.0000 [IU] | Freq: Once | INTRAMUSCULAR | Status: AC
  Administered 2023-03-28: 5 [IU] via SUBCUTANEOUS

## 2023-03-28 NOTE — Discharge Instructions (Signed)
 Call your dialysis center tomorrow to schedule further dialysis.  Follow up with your Physicain for recheck

## 2023-03-28 NOTE — ED Triage Notes (Signed)
 Pt BIB [facility] from dialysis center due to chest pain.  Dialysis center only got to pull off one hour treatment.  [facility] gave 324mg  aspirin en route.  Pt does report nausea and abdomen is distended.  Fistula on right upper arm. VS BP 174/100, HR 104, SpO2 100%, CBG 399

## 2023-03-28 NOTE — ED Provider Notes (Signed)
 [facility] AT [facility] Provider Note   CSN: [id number] Arrival date & time: 03/28/23  1341     History  No chief complaint on file.   Ryan Jenkins is a 63 y.o. male.  Pt complains of chest pain while having dialysis today.  Pt reports he has had similar in the past.  Pt reports he has also had a headache today.  Pt reports he was only on dialysis for one hour.  Pt has a past medical history of  renal failure on dialysis.  Pt is an insulin dependent diabetic patient denies any fever or chills.  He denies any cough or congestion patient patient denies any shortness of breath.  He is not having any abdominal pain.  Patient states that he does not feel like he is having any fluid overload.        Home Medications Prior to Admission medications   Medication Sig Start Date End Date Taking? Authorizing Provider  amLODipine (NORVASC) 5 MG tablet Take 5 mg by mouth daily. 11/22/22  Yes [provider]  famotidine (PEPCID) 40 MG tablet Take 40 mg by mouth daily as needed for heartburn or indigestion.   Yes [provider]  furosemide (LASIX) 40 MG tablet Take 40 mg by mouth 2 (two) times daily. 05/24/22  Yes [provider]  gabapentin (NEURONTIN) 300 MG capsule Take 300 mg by mouth See admin instructions. Take 300 mg by mouth on Tuesday, Thursday, Saturday after dialysis 11/22/22  Yes [provider]  LANTUS SOLOSTAR 100 UNIT/ML Solostar Pen Inject 5 Units into the skin at bedtime. Patient taking differently: Inject 15 Units into the skin at bedtime. 03/29/22  Yes Dahal, Melina Schools, MD  meclizine (ANTIVERT) 25 MG tablet Take 25 mg by mouth at bedtime. 01/10/23  Yes [provider]  pioglitazone (ACTOS) 15 MG tablet Take 15 mg by mouth every morning. 10/25/22  Yes [provider]  polyethylene glycol (MIRALAX / GLYCOLAX) 17 g packet Take 17 g by mouth daily as needed for mild constipation.   Yes [provider]  rosuvastatin (CRESTOR) 10 MG tablet Take 10 mg by mouth at bedtime.   Yes [provider]  tamsulosin (FLOMAX) 0.4 MG CAPS capsule Take 0.8 mg by mouth in the morning. 05/25/21  Yes [provider]  triamcinolone ointment (KENALOG) 0.5 % Apply 1 Application topically 2 (two) times daily as needed (for itching).   Yes [provider]  TYLENOL 500 MG tablet Take 500-1,000 mg by mouth every 6 (six) hours as needed (for pain or headaches).   Yes [provider]  finasteride (PROSCAR) 5 MG tablet Take 1 tablet (5 mg total) by mouth daily. Patient not taking: Reported on 03/28/2023 07/20/20   Hoy Register, MD  metoprolol succinate (TOPROL-XL) 25 MG 24 hr tablet Take 0.5 tablets (12.5 mg total) by mouth daily. Patient not taking: Reported on 03/28/2023 03/30/22 08/19/24  Lorin Glass, MD      Allergies    Latex, Loratadine, Hydrochlorothiazide, Lyrica [pregabalin], and Metformin and related    Review of Systems   Review of Systems  Respiratory:  Positive for shortness of breath.   Cardiovascular:  Positive for chest pain.  All other systems reviewed and are negative.   Physical Exam Updated Vital Signs BP (!) 143/83   Pulse 92   Temp 98 F (36.7 C) (Oral)   Resp (!) 9   Ht 6\' 2"  (1.88 m)   Wt 95.7 kg  SpO2 100%   BMI 27.09 kg/m  Physical Exam Vitals and nursing note reviewed.  Constitutional:      Appearance: He is well-developed.  HENT:     Head: Normocephalic.     Right Ear: Tympanic membrane normal.     Left Ear: Tympanic membrane normal.     Nose: Nose normal.     Mouth/Throat:     Mouth: Mucous membranes are moist.  Cardiovascular:     Rate and Rhythm: Normal rate.  Pulmonary:     Effort: Pulmonary effort is normal.  Abdominal:     General: There is no distension.  Musculoskeletal:        General: Normal range of motion.     Cervical back: Normal range of motion.  Skin:    General: Skin is warm.  Neurological:      General: No focal deficit present.     Mental Status: He is alert and oriented to person, place, and time.     ED Results / Procedures / Treatments   Labs (all labs ordered are listed, but only abnormal results are displayed) Labs Reviewed  BASIC METABOLIC PANEL - Abnormal; Notable for the following components:      Result Value   Chloride 96 (*)    Glucose, Bld 401 (*)    BUN 24 (*)    Creatinine, Ser 4.39 (*)    GFR, Estimated 14 (*)    All other components within normal limits  CBC - Abnormal; Notable for the following components:   RBC 3.83 (*)    Hemoglobin 11.9 (*)    HCT 34.1 (*)    All other components within normal limits  CBG MONITORING, ED - Abnormal; Notable for the following components:   Glucose-Capillary 286 (*)    All other components within normal limits  TROPONIN I (HIGH SENSITIVITY) - Abnormal; Notable for the following components:   Troponin I (High Sensitivity) 36 (*)    All other components within normal limits  TROPONIN I (HIGH SENSITIVITY) - Abnormal; Notable for the following components:   Troponin I (High Sensitivity) 37 (*)    All other components within normal limits    EKG EKG Interpretation Date/Time:  Thursday March 28 2023 13:49:26 EDT Ventricular Rate:  105 PR Interval:  187 QRS Duration:  111 QT Interval:  327 QTC Calculation: 433 R Axis:   -9  Text Interpretation: Sinus tachycardia LVH with IVCD and secondary repol abnrm Confirmed by Anders Simmonds [phone]) on 03/28/2023 1:50:46 PM  Radiology DG Chest Port 1 View Result Date: 03/28/2023 CLINICAL DATA:  Pain. EXAM: PORTABLE CHEST 1 VIEW COMPARISON:  11/28/2022 FINDINGS: Right-sided dialysis catheter is been removed. The heart is upper normal in size. No focal airspace disease, pulmonary edema, pleural effusion or pneumothorax. IMPRESSION: No acute chest findings. Electronically Signed   By: Narda Rutherford M.D.   On: 03/28/2023 17:05    Procedures Procedures    Medications  Ordered in ED Medications  acetaminophen (TYLENOL) tablet 650 mg (650 mg Oral Given 03/28/23 1610)  insulin aspart (novoLOG) injection 5 Units (5 Units Subcutaneous Given 03/28/23 1611)    ED Course/ Medical Decision Making/ A&P                                 Medical Decision Making Patient reports he had chest pain while he was receiving dialysis today.  Amount and/or Complexity of Data Reviewed Independent Historian: EMS  Details: Patient is brought in by EMS. Labs: ordered. Decision-making details documented in ED Course.    Details: Troponin is slightly elevated at 30 6 repeat troponin is 37.  Glucose is elevated at 401 BUN is 24 creatinine is 4.39.  Patient's potassium is normal. Radiology: ordered and independent interpretation performed. Decision-making details documented in ED Course.    Details: Chest x-ray no acute cardiopulmonary disease ECG/medicine tests: ordered and independent interpretation performed. Decision-making details documented in ED Course.    Details: EKG sinus tachycardia no acute changes  Risk OTC drugs. Prescription drug management. Risk Details: Patient given Tylenol here for headache.  Patient's labs are reassuring.  Patient is advised to contact his dialysis center to complete his dialysis tomorrow.  He is advised to return to the emergency department if any problems.           Final Clinical Impression(s) / ED Diagnoses Final diagnoses:  Chest pain, unspecified type    Rx / DC Orders ED Discharge Orders     None      An After Visit Summary was printed and given to the patient.    Elson Areas, PA-C 03/28/23 1859    Rolan Bucco, MD 03/28/23 2351

## 2023-04-18 ENCOUNTER — Encounter: Payer: Self-pay | Admitting: Neurology

## 2023-05-10 ENCOUNTER — Ambulatory Visit (HOSPITAL_COMMUNITY)
Admission: RE | Admit: 2023-05-10 | Discharge: 2023-05-10 | Disposition: A | Discharge status: Home / Self Care | Attending: Nephrology | Admitting: Nephrology

## 2023-05-10 ENCOUNTER — Other Ambulatory Visit: Payer: Self-pay

## 2023-05-10 ENCOUNTER — Encounter (HOSPITAL_COMMUNITY)
Admission: RE | Disposition: A | Discharge status: Home / Self Care | Payer: Self-pay | Source: Home / Self Care | Attending: Nephrology

## 2023-05-10 DIAGNOSIS — T82858A Stenosis of vascular prosthetic devices, implants and grafts, initial encounter: Secondary | ICD-10-CM | POA: Insufficient documentation

## 2023-05-10 DIAGNOSIS — Y832 Surgical operation with anastomosis, bypass or graft as the cause of abnormal reaction of the patient, or of later complication, without mention of misadventure at the time of the procedure: Secondary | ICD-10-CM | POA: Diagnosis not present

## 2023-05-10 DIAGNOSIS — I132 Hypertensive heart and chronic kidney disease with heart failure and with stage 5 chronic kidney disease, or end stage renal disease: Secondary | ICD-10-CM | POA: Diagnosis not present

## 2023-05-10 DIAGNOSIS — N250 Renal osteodystrophy: Secondary | ICD-10-CM | POA: Insufficient documentation

## 2023-05-10 DIAGNOSIS — I5042 Chronic combined systolic (congestive) and diastolic (congestive) heart failure: Secondary | ICD-10-CM | POA: Diagnosis not present

## 2023-05-10 DIAGNOSIS — Z794 Long term (current) use of insulin: Secondary | ICD-10-CM | POA: Diagnosis not present

## 2023-05-10 DIAGNOSIS — E785 Hyperlipidemia, unspecified: Secondary | ICD-10-CM | POA: Insufficient documentation

## 2023-05-10 DIAGNOSIS — D631 Anemia in chronic kidney disease: Secondary | ICD-10-CM | POA: Insufficient documentation

## 2023-05-10 DIAGNOSIS — I871 Compression of vein: Secondary | ICD-10-CM | POA: Diagnosis not present

## 2023-05-10 DIAGNOSIS — Z8249 Family history of ischemic heart disease and other diseases of the circulatory system: Secondary | ICD-10-CM | POA: Diagnosis not present

## 2023-05-10 DIAGNOSIS — N186 End stage renal disease: Secondary | ICD-10-CM | POA: Diagnosis not present

## 2023-05-10 DIAGNOSIS — E1122 Type 2 diabetes mellitus with diabetic chronic kidney disease: Secondary | ICD-10-CM | POA: Diagnosis not present

## 2023-05-10 DIAGNOSIS — Z992 Dependence on renal dialysis: Secondary | ICD-10-CM | POA: Diagnosis not present

## 2023-05-10 HISTORY — PX: VENOUS ANGIOPLASTY: CATH118376

## 2023-05-10 HISTORY — PX: A/V SHUNT INTERVENTION: CATH118220

## 2023-05-10 LAB — GLUCOSE, CAPILLARY: Glucose-Capillary: 218 mg/dL — ABNORMAL HIGH (ref 70–99)

## 2023-05-10 SURGERY — A/V SHUNT INTERVENTION
Anesthesia: LOCAL | Laterality: Right

## 2023-05-10 MED ORDER — MIDAZOLAM HCL 2 MG/2ML IJ SOLN
INTRAMUSCULAR | Status: DC | PRN
  Administered 2023-05-10: 1 mg via INTRAVENOUS

## 2023-05-10 MED ORDER — HEPARIN (PORCINE) IN NACL 1000-0.9 UT/500ML-% IV SOLN
INTRAVENOUS | Status: DC | PRN
  Administered 2023-05-10: 500 mL

## 2023-05-10 MED ORDER — LIDOCAINE HCL (PF) 1 % IJ SOLN
INTRAMUSCULAR | Status: DC | PRN
  Administered 2023-05-10: 2 mL via SUBCUTANEOUS

## 2023-05-10 MED ORDER — FENTANYL CITRATE (PF) 100 MCG/2ML IJ SOLN
INTRAMUSCULAR | Status: AC
  Filled 2023-05-10: qty 2

## 2023-05-10 MED ORDER — LIDOCAINE HCL (PF) 1 % IJ SOLN
INTRAMUSCULAR | Status: AC
Start: 2023-05-10 — End: ?
  Filled 2023-05-10: qty 30

## 2023-05-10 MED ORDER — FENTANYL CITRATE (PF) 100 MCG/2ML IJ SOLN
INTRAMUSCULAR | Status: DC | PRN
  Administered 2023-05-10: 50 ug via INTRAVENOUS

## 2023-05-10 MED ORDER — MIDAZOLAM HCL 2 MG/2ML IJ SOLN
INTRAMUSCULAR | Status: AC
  Filled 2023-05-10: qty 2

## 2023-05-10 MED ORDER — IODIXANOL 320 MG/ML IV SOLN
INTRAVENOUS | Status: DC | PRN
  Administered 2023-05-10: 8 mL via INTRAVENOUS

## 2023-05-10 SURGICAL SUPPLY — 7 items
BAG SNAP BAND KOVER 36X36 (MISCELLANEOUS) ×2 IMPLANT
BALLOON ATHLETIS 7X40X75 (BALLOONS) IMPLANT
COVER DOME SNAP 22 D (MISCELLANEOUS) ×2 IMPLANT
SHEATH PINNACLE R/O II 6F 4CM (SHEATH) IMPLANT
SYR MEDALLION 10ML (SYRINGE) IMPLANT
TRAY PV CATH (CUSTOM PROCEDURE TRAY) ×2 IMPLANT
WIRE BENTSON .035X145CM (WIRE) IMPLANT

## 2023-05-10 NOTE — Op Note (Signed)
 Patient presents with decreased access flows of his right  BCF (placed 05/24/2022 with ligation of competing branches and bovine pericardial patch of the proximal stenosis on 09/18/2022).  On examination, the brachial cephalic fistula is hyperpulsatile the outflow.  Summary:  1)      The patient had successful angioplasty (7x4 Athletis FE ~18 atm) of significant stenosis in the cephalic vein arch not involving the confluence.  Minor irregularities in the outflow cephalic vein not treated; those areas were approximately 6 mm in diameter and he would need a 7 by a long balloon which may cause aggravation and more frequent future angioplasties. 2)      The body of the cephalic vein fistula was patent with good flows.  Approximately 50% arterial anastomosis stenosis not treated as access flows are very rapid already and distal arteries are small. 3)      The cephalic confluence and centrals were widely patent. 4)      This right BCF remains amenable to future percutaneous intervention.  Description of procedure: The arm was prepped and draped in the usual sterile fashion. The left upper arm brachial cephalic fistula was cannulated (16109) with an 18G Angiocath needle directed in an antegrade direction. A guidewire was inserted and exchanged for a 6 Fr sheath. Contrast [phone]) injection via the side port of the sheath was performed. The angiogram of the fistula (09811) showed a patent body of the right BCF, patent outflow cephalic vein there were irregularities along the outflow beyond the cannulation zone and a 70-80% cephalic vein arch stenosis approximately 1 inch away from the confluence. The inflow anastomosis a 50% stenosis and right sided centrals were patent.  The wire was advanced centrally without any difficulty. A 7x4 Athletis angioplasty balloon was inserted over a glide wire and positioned at the cephalic vein arch stenosis. Venous angioplasty (91478) was carried out to 16-18 ATM with FULL effacement  of the waist on the balloon at the arch lesion.  Repeat angiogram showed 10% residual at the site of angioplasty with no evidence of extravasation.  The flow of contrast was quicker and the fistula was markedly less pulsatile.  Hemostasis: A 3-0 ethilon purse string suture was placed at the cannulation site on removal of the sheath.  Sedation: 1 mg Versed , 50 mcg Fentanyl . Sedation time. 13 minutes  Contrast. 8 mL  Monitoring: Because of the patient's comorbid conditions and sedation during the procedure, continuous EKG monitoring and O2 saturation monitoring was performed throughout the procedure by the RN. There were no abnormal arrhythmias encountered.  Complications: None.   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 Continue to cannulate the fistula with 15G needles.  2. Refer back for problems with flows. 3. Remove the suture next treatment.   Discharge: The patient was discharged home in stable condition. The patient was given education regarding the care of the dialysis access AVF and specific instructions in case of any problems.

## 2023-05-10 NOTE — H&P (Signed)
 Chief Complaint: Decreased flows  Interval H&P  The patient has presented today for an angiogram/ angioplasty.  Various methods of treatment have been discussed with the patient.  After consideration of risk, benefits and other options for treatment, the patient has consented to a angiogram/ angioplasty with  possible stent placement.   Risks of angiogram with potential angioplasty and stenting if needed.contrast reaction, extravasation/ bleeding, dissection, hypotension and death were explained to the patient.  The patient's history has been reviewed and the patient has been examined, no changes in status.  Stable for angiogram/angioplasty  I have reviewed the patient's chart and labs.  Questions were answered to the patient's satisfaction.  Assessment/Plan: ESRD dialyzing  TTS  Decreased access flows and a right arm radial cephalic fistula placed on 05/24/2022 with a revision including ligation of competing branches as well as a bovine pericardial patch angioplasty of a proximal stenosis by Dr. Shaunna Delaware on 09/18/2022 - planning on angiogram with possibly angioplasty. Renal osteodystrophy - continue binders per home regimen. Anemia - managed with ESA's and IV iron at dialysis center. HTN - resume home regimen.   HPI: Ryan Jenkins is an 63 y.o. male with BPH, coronary atherosclerotic heart disease, diabetes, hypertension, hyperlipidemia, end-stage renal disease being referred for decreasing access flows in the right arm radial cephalic fistula.  ROS Per HPI.  Chemistry and CBC: Creatinine, Ser  Date/Time Value Ref Range Status  03/28/2023 02:03 PM 4.39 (H) 0.61 - 1.24 mg/dL Final  13/08/6576 46:96 AM 5.18 (H) 0.61 - 1.24 mg/dL Final  29/52/8413 24:40 AM 5.60 (H) 0.61 - 1.24 mg/dL Final  11/11/2534 64:40 AM 6.00 (H) 0.61 - 1.24 mg/dL Final  34/74/2595 63:87 AM 4.80 (H) 0.61 - 1.24 mg/dL Final  56/43/3295 18:84 AM 5.10 (H) 0.61 - 1.24 mg/dL Final  16/60/6301 60:10 AM 4.41 (H) 0.61 -  1.24 mg/dL Final  93/23/5573 22:02 AM 5.05 (H) 0.61 - 1.24 mg/dL Final  54/27/0623 76:28 AM 6.49 (H) 0.61 - 1.24 mg/dL Final  31/51/7616 07:37 AM 8.23 (H) 0.61 - 1.24 mg/dL Final  10/62/6948 54:62 AM 8.34 (H) 0.61 - 1.24 mg/dL Final  70/35/0093 81:82 AM 8.23 (H) 0.61 - 1.24 mg/dL Final  99/37/1696 78:93 AM 7.75 (H) 0.61 - 1.24 mg/dL Final  81/01/7508 25:85 AM 7.27 (H) 0.61 - 1.24 mg/dL Final  27/78/2423 53:61 AM 6.02 (H) 0.61 - 1.24 mg/dL Final  44/31/5400 86:76 AM 5.39 (H) 0.61 - 1.24 mg/dL Final  19/50/9326 71:24 PM 5.40 (H) 0.61 - 1.24 mg/dL Final  58/09/9831 82:50 AM 4.97 (H) 0.61 - 1.24 mg/dL Final  53/97/6734 19:37 AM 5.12 (H) 0.61 - 1.24 mg/dL Final  90/24/0973 53:29 AM 4.96 (H) 0.61 - 1.24 mg/dL Final  92/42/6834 19:62 AM 4.49 (H) 0.61 - 1.24 mg/dL Final  22/97/9892 11:94 AM 4.37 (H) 0.61 - 1.24 mg/dL Final  17/40/8144 81:85 AM 3.92 (H) 0.61 - 1.24 mg/dL Final  63/14/9702 63:78 PM 4.17 (H) 0.61 - 1.24 mg/dL Final  58/85/0277 41:28 AM 6.81 (H) 0.61 - 1.24 mg/dL Final  78/67/6720 94:70 AM 7.00 (H) 0.61 - 1.24 mg/dL Final  96/28/3662 94:76 AM 6.51 (H) 0.61 - 1.24 mg/dL Final  54/65/0354 65:68 AM 5.82 (H) 0.61 - 1.24 mg/dL Final  12/75/1700 17:49 AM 5.70 (H) 0.61 - 1.24 mg/dL Final  44/96/7591 63:84 AM 5.81 (H) 0.61 - 1.24 mg/dL Final  66/59/9357 01:77 AM 5.78 (H) 0.61 - 1.24 mg/dL Final  93/90/3009 23:30 AM 5.91 (H) 0.61 - 1.24 mg/dL Final  07/62/2633 35:45 PM 6.12 (  H) 0.61 - 1.24 mg/dL Final  16/10/9602 54:09 AM 4.63 (H) 0.61 - 1.24 mg/dL Final  81/19/1478 29:56 PM 4.49 (H) 0.61 - 1.24 mg/dL Final  21/30/8657 84:69 AM 3.74 (H) 0.61 - 1.24 mg/dL Final  62/95/2841 32:44 AM 4.05 (H) 0.61 - 1.24 mg/dL Final  01/17/7251 66:44 AM 4.76 (H) 0.61 - 1.24 mg/dL Final  03/47/4259 56:38 AM 6.04 (H) 0.61 - 1.24 mg/dL Final  75/64/3329 51:88 AM 6.29 (H) 0.61 - 1.24 mg/dL Final  41/66/0630 16:01 AM 6.74 (H) 0.61 - 1.24 mg/dL Final  09/32/3557 32:20 AM 6.66 (H) 0.61 - 1.24 mg/dL Final   25/42/7062 37:62 AM 5.43 (H) 0.61 - 1.24 mg/dL Final  83/15/1761 60:73 AM 4.97 (H) 0.61 - 1.24 mg/dL Final  71/06/2692 85:46 AM 4.04 (H) 0.61 - 1.24 mg/dL Final  27/03/5007 38:18 AM 3.85 (H) 0.61 - 1.24 mg/dL Final  29/93/7169 67:89 AM 3.92 (H) 0.61 - 1.24 mg/dL Final  38/10/1749 02:58 AM 3.90 (H) 0.61 - 1.24 mg/dL Final  52/77/8242 35:36 PM 3.79 (H) 0.61 - 1.24 mg/dL Final  14/43/1540 08:67 PM 3.78 (H) 0.61 - 1.24 mg/dL Final  61/95/0932 67:12 PM 2.94 (H) 0.61 - 1.24 mg/dL Final  45/80/9983 38:25 AM 4.14 (H) 0.61 - 1.24 mg/dL Final   No results for input(s): "NA", "K", "CL", "CO2", "GLUCOSE", "BUN", "CREATININE", "CALCIUM ", "PHOS" in the last 168 hours.  Invalid input(s): "ALB" No results for input(s): "WBC", "NEUTROABS", "HGB", "HCT", "MCV", "PLT" in the last 168 hours. Liver Function Tests: No results for input(s): "AST", "ALT", "ALKPHOS", "BILITOT", "PROT", "ALBUMIN " in the last 168 hours. No results for input(s): "LIPASE", "AMYLASE" in the last 168 hours. No results for input(s): "AMMONIA" in the last 168 hours. Cardiac Enzymes: No results for input(s): "CKTOTAL", "CKMB", "CKMBINDEX", "TROPONINI" in the last 168 hours. Iron Studies: No results for input(s): "IRON", "TIBC", "TRANSFERRIN", "FERRITIN" in the last 72 hours. PT/INR: @LABRCNTIP (inr:5)  Xrays/Other Studies: )No results found for this or any previous visit (from the past 48 hours). No results found.  PMH:   Past Medical History:  Diagnosis Date   Anemia    Anginal pain (HCC)    Anxiety    Arthritis    BPH with obstruction/lower urinary tract symptoms    CAD (coronary artery disease)    cardiologist--- dr Adline Hook badal;  11-06-2019 cardiac cath in [place]  (result in care everywhere)  nonobstructive cad involing pRCA 60% (done in setting worseing CHF/ acute pulmonary edema requiring intubation/ AKI   Chronic combined systolic and diastolic CHF (congestive heart failure) (HCC)    Chronic kidney disease, stage IV  (severe) (HCC)    nephrologist--- dr Edson Graces   Depression    Diabetic neuropathy (HCC)    Dyspnea    Edema of both lower extremities    Gastric ulcer without hemorrhage or perforation 12/25/2018   GERD (gastroesophageal reflux disease)    Heart murmur    Hemodialysis patient (HCC)    History of community acquired pneumonia    ad[facility]04-20[facility]  w/ ARF hypoxia w/ severe sepsis   Hyperlipidemia    Hypertension    Insulin  dependent type 2 diabetes mellitus (HCC)    LBBB (left bundle branch block)    Pneumonia    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)    Uses walker    Vitamin B12 deficiency    Vitreous hemorrhage (HCC)     PSH:   Past Surgical History:  Procedure Laterality Date   A/V FISTULAGRAM Right 07/17/2022   Procedure:  A/V Fistulagram;  Surgeon: Dannis Dy, MD;  Location: [facility] INVASIVE CV LAB;  Service: Vascular;  Laterality: Right;   A/V FISTULAGRAM N/A 02/01/2023   Procedure: A/V Fistulagram;  Surgeon: Melodie Spry, MD;  Location: [facility] INVASIVE CV LAB;  Service: Cardiovascular;  Laterality: N/A;   AV FISTULA PLACEMENT Right 05/24/2022   Procedure: RADIOCEPHALIC ARTERIOVENOUS (AV) FISTULA CREATION;  Surgeon: Kayla Part, MD;  Location: [facility] OR;  Service: Vascular;  Laterality: Right;   CARDIAC CATHETERIZATION  11/06/2019   [facility] in [place] ;    nonobstructive CAD , pRCA 60% (result in care everywhere)   CATARACT EXTRACTION W/ INTRAOCULAR LENS IMPLANT Bilateral 2017   IR FLUORO GUIDE CV LINE RIGHT  05/23/2022   IR US  GUIDE VASC ACCESS RIGHT  05/23/2022   PATCH ANGIOPLASTY Right 09/18/2022   Procedure: BOVINE PATCH ANGIOPLASTY 1CM x 6CM;  Surgeon: Dannis Dy, MD;  Location: [facility] OR;  Service: Vascular;  Laterality: Right;   PERIPHERAL VASCULAR BALLOON ANGIOPLASTY Right 02/01/2023   Procedure: PERIPHERAL VASCULAR BALLOON ANGIOPLASTY;  Surgeon: Melodie Spry, MD;  Location[facility] INVASIVE CV LAB;  Service: Cardiovascular;  Laterality:  Right;  AA and Cphalic inflow vein   REVISON OF ARTERIOVENOUS FISTULA Right 09/18/2022   Procedure: REVISON OF RIGHT UPPER ARM ARTERIOVENOUS FISTULA;  Surgeon: Dannis Dy, MD;  Location: [facility] OR;  Service: Vascular;  Laterality: Right;   TRANSURETHRAL RESECTION OF PROSTATE N/A 10/16/2021   Procedure: TRANSURETHRAL RESECTION OF THE PROSTATE (TURP);  Surgeon: Lahoma Pigg, MD;  Location: [facility] ORS;  Service: Urology;  Laterality: N/A;    Allergies:  Allergies  Allergen Reactions   Latex Hives and Swelling    catheter   Loratadine Swelling and Other (See Comments)    Joint swelling   Hydrochlorothiazide  Other (See Comments)    Dizziness   Lyrica [Pregabalin] Other (See Comments)    Depression. "Makes me loopy"    Metformin And Related Diarrhea and Nausea And Vomiting    Medications:   Prior to Admission medications   Medication Sig Start Date End Date Taking? Authorizing Provider  acetaminophen  (TYLENOL ) 650 MG CR tablet Take 650 mg by mouth every 8 (eight) hours as needed for pain.   Yes [provider]  amLODipine  (NORVASC ) 5 MG tablet Take 5 mg by mouth daily. 11/22/22  Yes [provider]  B Complex-C-Folic Acid  (RENAL-VITE) 0.8 MG TABS Take 1 tablet by mouth daily.   Yes [provider]  calcium  acetate (PHOSLO) 667 MG capsule Take 1,334 mg by mouth 3 (three) times daily with meals.   Yes [provider]  famotidine  (PEPCID ) 40 MG tablet Take 40 mg by mouth daily as needed for heartburn or indigestion. Complete   Yes [provider]  furosemide  (LASIX ) 40 MG tablet Take 40 mg by mouth daily. 05/24/22  Yes [provider]  gabapentin  (NEURONTIN ) 300 MG capsule Take 300 mg by mouth every Monday, Wednesday, and Friday at 8 PM. After diaysis 11/22/22  Yes [provider]  hydrALAZINE  (APRESOLINE ) 100 MG tablet Take 100 mg by mouth 2 (two) times daily.   Yes [provider]  LANTUS  SOLOSTAR 100 UNIT/ML Solostar Pen  Inject 5 Units into the skin at bedtime. Patient taking differently: Inject 15 Units into the skin at bedtime. 03/29/22  Yes Dahal, Aminta Baldy, MD  meclizine  (ANTIVERT ) 25 MG tablet Take 25 mg by mouth at bedtime. 01/10/23  Yes [provider]  pioglitazone (ACTOS) 15 MG tablet Take 15  mg by mouth every morning. 10/25/22  Yes [provider]  polyethylene glycol (MIRALAX  / GLYCOLAX ) 17 g packet Take 17 g by mouth daily as needed for mild constipation.   Yes [provider]  rosuvastatin  (CRESTOR ) 10 MG tablet Take 10 mg by mouth at bedtime.   Yes [provider]  metoprolol  succinate (TOPROL -XL) 25 MG 24 hr tablet Take 0.5 tablets (12.5 mg total) by mouth daily. Patient not taking: Reported on 03/28/2023 03/30/22 08/19/24  Hoyt Macleod, MD    Discontinued Meds:   Medications Discontinued During This Encounter  Medication Reason   TYLENOL  500 MG tablet Patient Preference   finasteride  (PROSCAR ) 5 MG tablet Patient Preference   tamsulosin  (FLOMAX ) 0.4 MG CAPS capsule Patient Preference   triamcinolone ointment (KENALOG) 0.5 % Patient Preference    Social History:  reports that he has never smoked. He has never been exposed to tobacco smoke. He has never used smokeless tobacco. He reports that he does not drink alcohol and does not use drugs.  Family History:   Family History  Problem Relation Age of Onset   Renal cancer Mother    Hypertension Mother    Pancreatic cancer Mother    Hypertension Sister    Stroke Sister    Leukemia Maternal Uncle    Sickle cell trait Maternal Aunt    Colon cancer Neg Hx    Esophageal cancer Neg Hx    Rectal cancer Neg Hx    Stomach cancer Neg Hx     There were no vitals taken for this visit. GEN: NAD, A&Ox3, NCAT HEENT: No conjunctival pallor, EOMI NECK: Supple, no thyromegaly LUNGS: CTA B/L no rales, rhonchi or wheezing CV: RRR, No M/R/G ABD: SNDNT +BS  EXT: No lower extremity edema ACCESS:        Patrick Boor, MD 05/10/2023, 7:38 AM

## 2023-05-10 NOTE — Discharge Instructions (Signed)

## 2023-05-12 ENCOUNTER — Encounter (HOSPITAL_COMMUNITY): Payer: Self-pay | Admitting: Nephrology

## 2023-05-29 ENCOUNTER — Encounter: Payer: Self-pay | Admitting: Neurology

## 2023-05-29 ENCOUNTER — Ambulatory Visit (INDEPENDENT_AMBULATORY_CARE_PROVIDER_SITE_OTHER): Admitting: Neurology

## 2023-05-29 VITALS — BP 143/66 | HR 101 | Ht 74.0 in | Wt 210.0 lb

## 2023-05-29 DIAGNOSIS — E1140 Type 2 diabetes mellitus with diabetic neuropathy, unspecified: Secondary | ICD-10-CM

## 2023-05-29 DIAGNOSIS — Z794 Long term (current) use of insulin: Secondary | ICD-10-CM

## 2023-05-29 NOTE — Progress Notes (Signed)
 [facility] Neurology Division Clinic Note - Initial Visit   Date: 05/29/2023   Ryan Jenkins MRN: [id number] DOB: [birth year]   Dear Wilmon Hashimoto, P-C:  Thank you for your kind referral of [name] for consultation of neuropathy. Although his history is well known to you, please allow us  to reiterate it for the purpose of our medical record. The patient was accompanied to the clinic by self.    Ryan Jenkins is a 63 y.o. right-handed male with uncontrolled diabetes mellitus (HbA1c 11.8) complicated by retinopathy, neuropathy and CKD, hyperlipidemia, hypertension, GERD, BPH, anemia, depression, GAD, and ESRD on dialysis (Tue, Thur, Sat) presenting for evaluation of neuropathy.   IMPRESSION/PLAN: Longstanding diabetic neuropathy neuropathy affecting a stocking-glove distribution with predominately numbness involving the lower legs, feet, and hands.  He does not have significant painful paresthesias.  Discussed that there is no treatment for numbness and that medications such as gabapentin  are used for pain, which is why he had no relief with gabapentin  in the past.  NCS/EMG would not change management and therefore opted not to proceed. Neuropathy tends to be slowly progressive and management is symptomatic.  Patient educated on daily foot inspection, fall prevention, and safety precautions around the home.  Continue PT for balance training.  Educated on importance of optimal control of diabetes to prevent progression.  Right arm pain involving the shoulder and proximal arm.  Symptoms seem musculoskeletal.  He does not have radicular symptoms.   Return to clinic as needed  ------------------------------------------------------------- History of present illness: He has a long history of diabetes and was diagnosed in 1996.  He has symptoms of neuropathy for the past 10 + years.  He has numbness involving the feet, imbalance, and weakness in the feet.  He also has  numbness in the hands. He has been using a rollator since 2021.  He denies tingling, burning, or stabbing pain.  He was on gabapentin  300mg  TID and did not have any relief, so PCP stopped it.  He also complains of right shoulder pain and muscle pain involving the back of the upper arm.   He lives at home with brother in a one-level home.  Nonsmoker and does not drink alcohol.    Out-side paper records, electronic medical record, and images have been reviewed where available and summarized as:  Lab Results  Component Value Date   HGBA1C 7.4 (H) 03/22/2022   Lab Results  Component Value Date   VITAMINB12 370 02/03/2021   Lab Results  Component Value Date   TSH 3.010 01/25/2022   No results found for: "ESRSEDRATE", "POCTSEDRATE"  Past Medical History:  Diagnosis Date   Anemia    Anginal pain (HCC)    Anxiety    Arthritis    BPH with obstruction/lower urinary tract symptoms    CAD (coronary artery disease)    cardiologist--- dr Adline Hook badal;  11-06-2019 cardiac cath in [place]  (result in care everywhere)  nonobstructive cad involing pRCA 60% (done in setting worseing CHF/ acute pulmonary edema requiring intubation/ AKI   Chronic combined systolic and diastolic CHF (congestive heart failure) (HCC)    Chronic kidney disease, stage IV (severe) [facility])    nephrologist--- dr Edson Graces   Depression    Diabetic neuropathy (HCC)    Dyspnea    Edema of both lower extremities    Gastric ulcer without hemorrhage or perforation 12/25/2018   GERD (gastroesophageal reflux disease)    Heart murmur    Hemodialysis patient (HCC)  History of community acquired pneumonia    admission 06-04-2021 in [facility]  w/ ARF hypoxia w/ severe sepsis   Hyperlipidemia    Hypertension    Insulin  dependent type 2 diabetes mellitus (HCC)    LBBB (left bundle branch block)    Pneumonia    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)    Uses walker    Vitamin B12 deficiency    Vitreous hemorrhage [facility])      Past Surgical History:  Procedure Laterality Date   A/V FISTULAGRAM Right 07/17/2022   Procedure: A/V Fistulagram;  Surgeon: Dannis Dy, MD;  Location: [facility] INVASIVE CV LAB;  Service: Vascular;  Laterality: Right;   A/V FISTULAGRAM N/A 02/01/2023   Procedure: A/V Fistulagram;  Surgeon: Melodie Spry, MD;  Location: [facility] INVASIVE CV LAB;  Service: Cardiovascular;  Laterality: N/A;   A/V SHUNT INTERVENTION N/A 05/10/2023   Procedure: A/V SHUNT INTERVENTION;  Surgeon: Patrick Boor, MD;  Location: [facility] INVASIVE CV LAB;  Service: Cardiovascular;  Laterality: N/A;   AV FISTULA PLACEMENT Right 05/24/2022   Procedure: RADIOCEPHALIC ARTERIOVENOUS (AV) FISTULA CREATION;  Surgeon: Kayla Part, MD;  Location: [facility] OR;  Service: Vascular;  Laterality: Right;   CARDIAC CATHETERIZATION  11/06/2019   [facility] in [place] ;    nonobstructive CAD , pRCA 60% (result in care everywhere)   CATARACT EXTRACTION W/ INTRAOCULAR LENS IMPLANT Bilateral 2017   IR FLUORO GUIDE CV LINE RIGHT  05/23/2022   IR US  GUIDE VASC ACCESS RIGHT  05/23/2022   PATCH ANGIOPLASTY Right 09/18/2022   Procedure: BOVINE PATCH ANGIOPLASTY 1CM x 6CM;  Surgeon: Dannis Dy, MD;  Location: [facility] OR;  Service: Vascular;  Laterality: Right;   PERIPHERAL VASCULAR BALLOON ANGIOPLASTY Right 02/01/2023   Procedure: PERIPHERAL VASCULAR BALLOON ANGIOPLASTY;  Surgeon: Melodie Spry, MD;  Location: [facility] INVASIVE CV LAB;  Service: Cardiovascular;  Laterality: Right;  AA and Cphalic inflow vein   REVISON OF ARTERIOVENOUS FISTULA Right 09/18/2022   Procedure: REVISON OF RIGHT UPPER ARM ARTERIOVENOUS FISTULA;  Surgeon: Dannis Dy, MD;  Location: [facility] OR;  Service: Vascular;  Laterality: Right;   TRANSURETHRAL RESECTION OF PROSTATE N/A 10/16/2021   Procedure: TRANSURETHRAL RESECTION OF THE PROSTATE (TURP);  Surgeon: Lahoma Pigg, MD;  Location: [facility] ORS;  Service: Urology;  Laterality: N/A;   VENOUS ANGIOPLASTY Right 05/10/2023    Procedure: VENOUS ANGIOPLASTY;  Surgeon: Patrick Boor, MD;  Location: [facility] INVASIVE CV LAB;  Service: Cardiovascular;  Laterality: Right;  70% Cephalic Arch     Medications:  Outpatient Encounter Medications as of 05/29/2023  Medication Sig   acetaminophen  (TYLENOL ) 650 MG CR tablet Take 650 mg by mouth every 8 (eight) hours as needed for pain.   amLODipine  (NORVASC ) 5 MG tablet Take 5 mg by mouth daily.   B Complex-C-Folic Acid  (RENAL-VITE) 0.8 MG TABS Take 1 tablet by mouth daily.   calcium  acetate (PHOSLO) 667 MG capsule Take 1,334 mg by mouth 3 (three) times daily with meals.   famotidine  (PEPCID ) 40 MG tablet Take 40 mg by mouth daily as needed for heartburn or indigestion. Complete   furosemide  (LASIX ) 40 MG tablet Take 40 mg by mouth daily.   gabapentin  (NEURONTIN ) 300 MG capsule Take 300 mg by mouth every Monday, Wednesday, and Friday at 8 PM. After diaysis (Patient not taking: Reported on 05/29/2023)   hydrALAZINE  (APRESOLINE ) 100 MG tablet Take 100 mg by mouth 2 (two) times daily.   LANTUS  SOLOSTAR 100 UNIT/ML Solostar Pen  Inject 5 Units into the skin at bedtime. (Patient taking differently: Inject 15 Units into the skin at bedtime.)   meclizine  (ANTIVERT ) 25 MG tablet Take 25 mg by mouth at bedtime.   metoprolol  succinate (TOPROL -XL) 25 MG 24 hr tablet Take 0.5 tablets (12.5 mg total) by mouth daily.   pioglitazone (ACTOS) 15 MG tablet Take 15 mg by mouth every morning.   polyethylene glycol (MIRALAX  / GLYCOLAX ) 17 g packet Take 17 g by mouth daily as needed for mild constipation.   rosuvastatin  (CRESTOR ) 10 MG tablet Take 10 mg by mouth at bedtime.   No facility-administered encounter medications on file as of 05/29/2023.    Allergies:  Allergies  Allergen Reactions   Latex Hives and Swelling    catheter   Loratadine Swelling and Other (See Comments)    Joint swelling   Hydrochlorothiazide  Other (See Comments)    Dizziness   Lyrica [Pregabalin] Other (See Comments)     Depression. "Makes me loopy"    Metformin And Related Diarrhea and Nausea And Vomiting    Family History: Family History  Problem Relation Age of Onset   Renal cancer Mother    Hypertension Mother    Pancreatic cancer Mother    Hypertension Sister    Stroke Sister    Leukemia Maternal Uncle    Sickle cell trait Maternal Aunt    Colon cancer Neg Hx    Esophageal cancer Neg Hx    Rectal cancer Neg Hx    Stomach cancer Neg Hx     Social History: Social History   Tobacco Use   Smoking status: Never    Passive exposure: Never   Smokeless tobacco: Never  Vaping Use   Vaping status: Never Used  Substance Use Topics   Alcohol use: No    Alcohol/week: 0.0 standard drinks of alcohol   Drug use: No   Social History   Social History Narrative   Are you right handed or left handed? Right Handed    Are you currently employed ? 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 No    Who lives with you? With brother    What type of home do you live in: 1 story or 2 story? Lives in a one story apartment.        Vital Signs:  BP (!) 143/66   Pulse (!) 101   Ht 6\' 2"  (1.88 m)   Wt 210 lb (95.3 kg)   SpO2 99%   BMI 26.96 kg/m   Neurological Exam: MENTAL STATUS including orientation to time, place, person, recent and remote memory, attention span and concentration, language, and fund of knowledge is normal.  Speech is not dysarthric.  CRANIAL NERVES: II:  No visual field defects.     III-IV-VI: Pupils equal round and reactive to light.  Normal conjugate, extra-ocular eye movements in all directions of gaze.  No nystagmus.  No ptosis.   V:  Normal facial sensation.    VII:  Normal facial symmetry and movements.   VIII:  Normal hearing and vestibular function.   IX-X:  Normal palatal movement.   XI:  Normal shoulder shrug and head rotation.   XII:  Normal tongue strength and range of motion, no deviation or  fasciculation.  MOTOR:  Generalized loss of muscle bulk in the arms and legs with FDI atrophy bilaterally.  No fasciculations or abnormal movements.  No  pronator drift.   Upper Extremity:  Right  Left  Deltoid  5/5   5/5   Biceps  5/5   5/5   Triceps  5/5   5/5   Wrist extensors  5/5   5/5   Wrist flexors  5/5   5/5   Finger extensors  5/5   5/5   Finger flexors  5/5   5/5   Dorsal interossei  4/5   4/5   Abductor pollicis  5/5   5/5   Tone (Ashworth scale)  0  0   Lower Extremity:  Right  Left  Hip flexors  5/5   5/5   Knee flexors  5/5   5/5   Knee extensors  5/5   5/5   Dorsiflexors  5-/5   5-/5   Plantarflexors  5-/5   5-/5   Toe extensors  4/5   4/5   Toe flexors  4/5   4/5   Tone (Ashworth scale)  0  0   MSRs:                                           Right        Left brachioradialis 1+  1+  biceps 1+  1+  triceps 1+  1+  patellar 0  0  ankle jerk 0  0  plantar response down  down   SENSORY:  Vibration reduced at the knees and absent below the ankles.  Gradient pattern of pin prick and temperature loss distal to mid calf.  Sensation intact in the hands.  Romberg's sign positive.   COORDINATION/GAIT: Normal finger-to- nose-finger.  Intact rapid alternating movements bilaterally.  Gait is assisted with rollator, mildly wide-base, stable, and slow.     Thank you for allowing me to participate in patient's care.  If I can answer any additional questions, I would be pleased to do so.    Sincerely,    Dakota Stangl K. Lydia Sams, DO

## 2023-06-21 ENCOUNTER — Encounter (HOSPITAL_COMMUNITY)
Admission: RE | Disposition: A | Discharge status: Home / Self Care | Payer: Self-pay | Source: Ambulatory Visit | Attending: Surgery

## 2023-06-21 ENCOUNTER — Ambulatory Visit (HOSPITAL_COMMUNITY)
Admission: RE | Admit: 2023-06-21 | Discharge: 2023-06-21 | Disposition: A | Discharge status: Home / Self Care | Source: Ambulatory Visit | Attending: Surgery | Admitting: Surgery

## 2023-06-21 ENCOUNTER — Other Ambulatory Visit: Payer: Self-pay

## 2023-06-21 DIAGNOSIS — I447 Left bundle-branch block, unspecified: Secondary | ICD-10-CM | POA: Diagnosis not present

## 2023-06-21 DIAGNOSIS — I5042 Chronic combined systolic (congestive) and diastolic (congestive) heart failure: Secondary | ICD-10-CM | POA: Diagnosis not present

## 2023-06-21 DIAGNOSIS — E1122 Type 2 diabetes mellitus with diabetic chronic kidney disease: Secondary | ICD-10-CM | POA: Insufficient documentation

## 2023-06-21 DIAGNOSIS — N185 Chronic kidney disease, stage 5: Secondary | ICD-10-CM

## 2023-06-21 DIAGNOSIS — K219 Gastro-esophageal reflux disease without esophagitis: Secondary | ICD-10-CM | POA: Diagnosis not present

## 2023-06-21 DIAGNOSIS — T82858A Stenosis of vascular prosthetic devices, implants and grafts, initial encounter: Secondary | ICD-10-CM

## 2023-06-21 DIAGNOSIS — Z794 Long term (current) use of insulin: Secondary | ICD-10-CM | POA: Diagnosis not present

## 2023-06-21 DIAGNOSIS — I871 Compression of vein: Secondary | ICD-10-CM | POA: Diagnosis not present

## 2023-06-21 DIAGNOSIS — I2510 Atherosclerotic heart disease of native coronary artery without angina pectoris: Secondary | ICD-10-CM | POA: Insufficient documentation

## 2023-06-21 DIAGNOSIS — T82898A Other specified complication of vascular prosthetic devices, implants and grafts, initial encounter: Secondary | ICD-10-CM

## 2023-06-21 DIAGNOSIS — N186 End stage renal disease: Secondary | ICD-10-CM | POA: Diagnosis not present

## 2023-06-21 DIAGNOSIS — I132 Hypertensive heart and chronic kidney disease with heart failure and with stage 5 chronic kidney disease, or end stage renal disease: Secondary | ICD-10-CM | POA: Insufficient documentation

## 2023-06-21 DIAGNOSIS — Z992 Dependence on renal dialysis: Secondary | ICD-10-CM | POA: Diagnosis not present

## 2023-06-21 HISTORY — PX: A/V SHUNT INTERVENTION: CATH118220

## 2023-06-21 SURGERY — A/V SHUNT INTERVENTION
Anesthesia: LOCAL

## 2023-06-21 MED ORDER — LIDOCAINE HCL (PF) 1 % IJ SOLN
INTRAMUSCULAR | Status: AC
  Filled 2023-06-21: qty 30

## 2023-06-21 MED ORDER — MIDAZOLAM HCL 2 MG/2ML IJ SOLN
INTRAMUSCULAR | Status: DC | PRN
  Administered 2023-06-21 (×2): 1 mg via INTRAVENOUS

## 2023-06-21 MED ORDER — LIDOCAINE HCL (PF) 1 % IJ SOLN
INTRAMUSCULAR | Status: DC | PRN
  Administered 2023-06-21: 5 mL via INTRADERMAL

## 2023-06-21 MED ORDER — MIDAZOLAM HCL 2 MG/2ML IJ SOLN
INTRAMUSCULAR | Status: AC
Start: 2023-06-21 — End: ?
  Filled 2023-06-21: qty 2

## 2023-06-21 MED ORDER — HEPARIN (PORCINE) IN NACL 1000-0.9 UT/500ML-% IV SOLN
INTRAVENOUS | Status: DC | PRN
  Administered 2023-06-21: 500 mL

## 2023-06-21 SURGICAL SUPPLY — 12 items
BALLOON ATHLETIS 7.0X80X75 (BALLOONS) IMPLANT
BALLOON MUSTANG 10.0X40 75 (BALLOONS) IMPLANT
BALLOON MUSTANG 5X80X75 (BALLOONS) IMPLANT
KIT ENCORE 26 ADVANTAGE (KITS) IMPLANT
KIT MICROPUNCTURE NIT STIFF (SHEATH) IMPLANT
KIT PV (KITS) ×1 IMPLANT
SHEATH PINNACLE R/O II 6F 4CM (SHEATH) IMPLANT
SHEATH PROBE COVER 6X72 (BAG) IMPLANT
TRAY PV CATH (CUSTOM PROCEDURE TRAY) ×1 IMPLANT
TUBING CIL FLEX 10 FLL-RA (TUBING) IMPLANT
WIRE BENTSON .035X145CM (WIRE) IMPLANT
WIRE TORQFLEX AUST .018X40CM (WIRE) IMPLANT

## 2023-06-21 NOTE — Op Note (Signed)
    Patient name: Ryan Jenkins MRN: [id number] DOB: April 07, 1960 Sex: male  06/21/2023 Pre-operative Diagnosis: ESRD Post-operative diagnosis:  Same Surgeon:  Gareld June Procedure Performed:  1.  Ultrasound-guided access, right brachiocephalic fistula  2.  Fistulogram  3.  Peripheral balloon venoplasty (right cephalic vein)  4.  Central balloon venoplasty (right innominate vein)   Indications: This is a 63 year old gentleman with difficulty with dialysis who comes in today for further evaluation  Procedure:  The patient was identified in the holding area and taken to room 8.  The patient was then placed supine on the table and prepped and draped in the usual sterile fashion.  A time out was called.  Ultrasound was used to evaluate the fistula.  The vein was patent and compressible.  A digital ultrasound image was acquired.  The fistula was then accessed under ultrasound guidance using a micropuncture needle.  An 018 wire was then asvanced without resistance and a micropuncture sheath was placed.  Contrast injections were then performed through the sheath.  Findings: There was approximately a 60% stenosis within the right brachiocephalic vein.  The remaining central venous system was widely patent.  The cephalic vein in the upper arm is small in caliber.  The arteriovenous anastomosis is without stenosis.  The vein does not appear well matured   Intervention: After the above images were acquired the decision was made to proceed with intervention.  Over a Bentson wire, a 6 French sheath was placed.  I first elected to perform balloon venoplasty of the cephalic vein with a 7 mm balloon.  I did not take this to nominal pressure because the profile of the balloon was suggesting that this was a impending rupture vein plus the patient was having very mild discomfort.  I did did balloon the majority of the upper arm cephalic vein with a 7 mm balloon.  Completion imaging was performed which showed  suboptimal results.  I then took a 5 mm balloon and essentially perform balloon venoplasty of the entire cephalic vein from the sheath tip up the central venous system.  I then selected a 10 x 40 Mustang balloon and performed balloon venoplasty of the innominate vein stenosis.  This resolved nicely after venoplasty.  At this point, I did not feel any additional treatment was necessary.  If the patient has early recurrence, he should be considered for new access or stenting of the majority of his fistula.  Impression:  #1  Successful venoplasty of the cephalic vein however the vein remains poorly matured and small in caliber.  If this does not resolve the issues, the patient should be considered for new access versus stenting of the majority of his fistula  #2  Successful venoplasty of a right innominate vein stenosis with a 10 mm balloon   V. Gareld June, M.D., [facility] [place] Office: [phone] Pager:  [phone]

## 2023-06-21 NOTE — H&P (Signed)
 [facility] [place]  Patient name: Ryan Jenkins MRN: [id number] DOB: 1960/10/21 Sex: male   REASON FOR VISIT:    Dialysis access challenges  HISOTRY OF PRESENT ILLNESS:    Ryan Jenkins is a 63 y.o. male who is status post right arm radiocephalic fistula on 05/24/2022.  He underwent branch ligation on 09/18/2022.  He has undergone angioplasty on 02/01/2023 and 05/10/2023.  He continues to have difficulty with cannulation.  Patient suffers from coronary artery disease.  He is medically managed for hypertension.  He is a type II diabetic.  He takes a statin for hypercholesterolemia.   PAST MEDICAL HISTORY:   Past Medical History:  Diagnosis Date   Anemia    Anginal pain (HCC)    Anxiety    Arthritis    BPH with obstruction/lower urinary tract symptoms    CAD (coronary artery disease)    cardiologist--- dr Adline Hook badal;  11-06-2019 cardiac cath in [place]  (result in care everywhere)  nonobstructive cad involing pRCA 60% (done in setting worseing CHF/ acute pulmonary edema requiring intubation/ AKI   Chronic combined systolic and diastolic CHF (congestive heart failure) (HCC)    Chronic kidney disease, stage IV (severe) (HCC)    nephrologist--- dr Edson Graces   Depression    Diabetic neuropathy (HCC)    Dyspnea    Edema of both lower extremities    Gastric ulcer without hemorrhage or perforation 12/25/2018   GERD (gastroesophageal reflux disease)    Heart murmur    Hemodialysis patient (HCC)    History of community acquired pneumonia    admission 06-04-2021 [facility]  w/ ARF hypoxia w/ severe sepsis   Hyperlipidemia    Hypertension    Insulin  dependent type 2 diabetes mellitus (HCC)    LBBB (left bundle branch block)    Pneumonia    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)    Uses walker    Vitamin B12 deficiency    Vitreous hemorrhage (HCC)      FAMILY HISTORY:   Family History  Problem Relation Age of  Onset   Renal cancer Mother    Hypertension Mother    Pancreatic cancer Mother    Hypertension Sister    Stroke Sister    Leukemia Maternal Uncle    Sickle cell trait Maternal Aunt    Colon cancer Neg Hx    Esophageal cancer Neg Hx    Rectal cancer Neg Hx    Stomach cancer Neg Hx     SOCIAL HISTORY:   Social History   Tobacco Use   Smoking status: Never    Passive exposure: Never   Smokeless tobacco: Never  Substance Use Topics   Alcohol use: No    Alcohol/week: 0.0 standard drinks of alcohol     ALLERGIES:   Allergies  Allergen Reactions   Latex Hives and Swelling    catheter   Loratadine Swelling and Other (See Comments)    Joint swelling   Hydrochlorothiazide  Other (See Comments)    Dizziness   Lyrica [Pregabalin] Other (See Comments)    Depression. "Makes me loopy"    Metformin And Related Diarrhea and Nausea And Vomiting     CURRENT MEDICATIONS:   No current facility-administered medications for this encounter.    REVIEW OF SYSTEMS:   [X]  denotes positive finding, [ ]  denotes negative finding Cardiac  Comments:  Chest pain or chest pressure:    Shortness of breath upon exertion:    Short of breath when  lying flat:    Irregular heart rhythm:        Vascular    Pain in calf, thigh, or hip brought on by ambulation:    Pain in feet at night that wakes you up from your sleep:     Blood clot in your veins:    Leg swelling:         Pulmonary    Oxygen at home:    Productive cough:     Wheezing:         Neurologic    Sudden weakness in arms or legs:     Sudden numbness in arms or legs:     Sudden onset of difficulty speaking or slurred speech:    Temporary loss of vision in one eye:     Problems with dizziness:         Gastrointestinal    Blood in stool:     Vomited blood:         Genitourinary    Burning when urinating:     Blood in urine:        Psychiatric    Major depression:         Hematologic    Bleeding problems:     Problems with blood clotting too easily:        Skin    Rashes or ulcers:        Constitutional    Fever or chills:      PHYSICAL EXAM:   Vitals:   06/21/23 1243  BP: (!) 179/92  Pulse: 91  Resp: 12  SpO2: 98%    GENERAL: The patient is a well-nourished male, in no acute distress. The vital signs are documented above. CARDIAC: There is a regular rate and rhythm.  VASCULAR: Thrill within radiocephalic fistula PULMONARY: Non-labored respirations NEUROLOGIC: No focal weakness or paresthesias are detected. SKIN: There are no ulcers or rashes noted. PSYCHIATRIC: The patient has a normal affect.  STUDIES:   none  MEDICAL ISSUES:   ESRD: Discussed proceeding with fistulogram and intervention as indicated.  All questions were answered.    [name] Slates, MD, FACS [facility] [phone] Pager [phone]

## 2023-06-24 ENCOUNTER — Encounter (HOSPITAL_COMMUNITY): Payer: Self-pay | Admitting: Surgery

## 2023-07-02 ENCOUNTER — Emergency Department (HOSPITAL_COMMUNITY)

## 2023-07-02 ENCOUNTER — Other Ambulatory Visit: Payer: Self-pay

## 2023-07-02 ENCOUNTER — Encounter (HOSPITAL_COMMUNITY): Payer: Self-pay

## 2023-07-02 ENCOUNTER — Observation Stay (HOSPITAL_COMMUNITY)
Admission: EM | Admit: 2023-07-02 | Discharge: 2023-07-04 | Disposition: A | Discharge status: Home Health | Attending: Infectious Diseases | Admitting: Infectious Diseases

## 2023-07-02 DIAGNOSIS — Z79899 Other long term (current) drug therapy: Secondary | ICD-10-CM | POA: Insufficient documentation

## 2023-07-02 DIAGNOSIS — E1122 Type 2 diabetes mellitus with diabetic chronic kidney disease: Secondary | ICD-10-CM | POA: Diagnosis not present

## 2023-07-02 DIAGNOSIS — M5441 Lumbago with sciatica, right side: Secondary | ICD-10-CM | POA: Insufficient documentation

## 2023-07-02 DIAGNOSIS — R531 Weakness: Secondary | ICD-10-CM | POA: Diagnosis present

## 2023-07-02 DIAGNOSIS — Z992 Dependence on renal dialysis: Secondary | ICD-10-CM | POA: Diagnosis not present

## 2023-07-02 DIAGNOSIS — R0602 Shortness of breath: Secondary | ICD-10-CM

## 2023-07-02 DIAGNOSIS — E1142 Type 2 diabetes mellitus with diabetic polyneuropathy: Secondary | ICD-10-CM | POA: Diagnosis not present

## 2023-07-02 DIAGNOSIS — I5042 Chronic combined systolic (congestive) and diastolic (congestive) heart failure: Secondary | ICD-10-CM | POA: Insufficient documentation

## 2023-07-02 DIAGNOSIS — G8929 Other chronic pain: Secondary | ICD-10-CM

## 2023-07-02 DIAGNOSIS — R079 Chest pain, unspecified: Secondary | ICD-10-CM

## 2023-07-02 DIAGNOSIS — Z91040 Latex allergy status: Secondary | ICD-10-CM | POA: Insufficient documentation

## 2023-07-02 DIAGNOSIS — R29898 Other symptoms and signs involving the musculoskeletal system: Principal | ICD-10-CM

## 2023-07-02 DIAGNOSIS — E785 Hyperlipidemia, unspecified: Secondary | ICD-10-CM | POA: Diagnosis not present

## 2023-07-02 DIAGNOSIS — E11319 Type 2 diabetes mellitus with unspecified diabetic retinopathy without macular edema: Secondary | ICD-10-CM | POA: Diagnosis not present

## 2023-07-02 DIAGNOSIS — Z7984 Long term (current) use of oral hypoglycemic drugs: Secondary | ICD-10-CM | POA: Insufficient documentation

## 2023-07-02 DIAGNOSIS — I132 Hypertensive heart and chronic kidney disease with heart failure and with stage 5 chronic kidney disease, or end stage renal disease: Secondary | ICD-10-CM | POA: Diagnosis not present

## 2023-07-02 DIAGNOSIS — K219 Gastro-esophageal reflux disease without esophagitis: Secondary | ICD-10-CM | POA: Diagnosis not present

## 2023-07-02 DIAGNOSIS — N186 End stage renal disease: Secondary | ICD-10-CM | POA: Diagnosis not present

## 2023-07-02 DIAGNOSIS — Z794 Long term (current) use of insulin: Secondary | ICD-10-CM | POA: Diagnosis not present

## 2023-07-02 DIAGNOSIS — I2510 Atherosclerotic heart disease of native coronary artery without angina pectoris: Secondary | ICD-10-CM | POA: Diagnosis not present

## 2023-07-02 DIAGNOSIS — D631 Anemia in chronic kidney disease: Secondary | ICD-10-CM | POA: Diagnosis not present

## 2023-07-02 DIAGNOSIS — E119 Type 2 diabetes mellitus without complications: Secondary | ICD-10-CM

## 2023-07-02 DIAGNOSIS — I10 Essential (primary) hypertension: Secondary | ICD-10-CM | POA: Diagnosis present

## 2023-07-02 LAB — CBC
HCT: 33.5 % — ABNORMAL LOW (ref 39.0–52.0)
Hemoglobin: 11.4 g/dL — ABNORMAL LOW (ref 13.0–17.0)
MCH: 31.4 pg (ref 26.0–34.0)
MCHC: 34 g/dL (ref 30.0–36.0)
MCV: 92.3 fL (ref 80.0–100.0)
Platelets: 290 10*3/uL (ref 150–400)
RBC: 3.63 MIL/uL — ABNORMAL LOW (ref 4.22–5.81)
RDW: 13 % (ref 11.5–15.5)
WBC: 5.3 10*3/uL (ref 4.0–10.5)
nRBC: 0 % (ref 0.0–0.2)

## 2023-07-02 LAB — BASIC METABOLIC PANEL WITH GFR
Anion gap: 12 (ref 5–15)
BUN: 25 mg/dL — ABNORMAL HIGH (ref 8–23)
CO2: 27 mmol/L (ref 22–32)
Calcium: 9.1 mg/dL (ref 8.9–10.3)
Chloride: 97 mmol/L — ABNORMAL LOW (ref 98–111)
Creatinine, Ser: 5.27 mg/dL — ABNORMAL HIGH (ref 0.61–1.24)
GFR, Estimated: 12 mL/min — ABNORMAL LOW (ref >60)
Glucose, Bld: 363 mg/dL — ABNORMAL HIGH (ref 70–99)
Potassium: 4.2 mmol/L (ref 3.5–5.1)
Sodium: 136 mmol/L (ref 135–145)

## 2023-07-02 LAB — I-STAT VENOUS BLOOD GAS, ED
Acid-Base Excess: 5 mmol/L — ABNORMAL HIGH (ref 0.0–2.0)
Bicarbonate: 28.4 mmol/L — ABNORMAL HIGH (ref 20.0–28.0)
Calcium, Ion: 1.04 mmol/L — ABNORMAL LOW (ref 1.15–1.40)
HCT: 33 % — ABNORMAL LOW (ref 39.0–52.0)
Hemoglobin: 11.2 g/dL — ABNORMAL LOW (ref 13.0–17.0)
O2 Saturation: 99 %
Potassium: 3 mmol/L — ABNORMAL LOW (ref 3.5–5.1)
Sodium: 136 mmol/L (ref 135–145)
TCO2: 29 mmol/L (ref 22–32)
pCO2, Ven: 34.8 mmHg — ABNORMAL LOW (ref 44–60)
pH, Ven: 7.519 — ABNORMAL HIGH (ref 7.25–7.43)
pO2, Ven: 130 mmHg — ABNORMAL HIGH (ref 32–45)

## 2023-07-02 LAB — TROPONIN I (HIGH SENSITIVITY)
Troponin I (High Sensitivity): 46 ng/L — ABNORMAL HIGH (ref <18)
Troponin I (High Sensitivity): 50 ng/L — ABNORMAL HIGH (ref <18)

## 2023-07-02 MED ORDER — AMLODIPINE BESYLATE 5 MG PO TABS
5.0000 mg | ORAL_TABLET | Freq: Once | ORAL | Status: AC
  Administered 2023-07-02: 5 mg via ORAL
  Filled 2023-07-02: qty 1

## 2023-07-02 MED ORDER — FENTANYL CITRATE PF 50 MCG/ML IJ SOSY
50.0000 ug | PREFILLED_SYRINGE | Freq: Once | INTRAMUSCULAR | Status: AC
  Administered 2023-07-02: 50 ug via INTRAVENOUS
  Filled 2023-07-02: qty 1

## 2023-07-02 MED ORDER — FUROSEMIDE 20 MG PO TABS
40.0000 mg | ORAL_TABLET | Freq: Once | ORAL | Status: AC
  Administered 2023-07-02: 40 mg via ORAL
  Filled 2023-07-02: qty 2

## 2023-07-02 MED ORDER — HYDRALAZINE HCL 25 MG PO TABS
100.0000 mg | ORAL_TABLET | Freq: Once | ORAL | Status: AC
  Administered 2023-07-02: 100 mg via ORAL
  Filled 2023-07-02: qty 4

## 2023-07-02 NOTE — ED Provider Triage Note (Signed)
 Emergency Medicine Provider Triage Evaluation Note  Ryan Jenkins , a 63 y.o. male  was evaluated in triage.  Pt complains of patient with a history of hypertension, diabetes, end-stage renal disease, with today's anticipated dialysis today presents with chest pain.  Onset was yesterday, severe episode followed by decrease in pain.  However, to this morning patient experienced exacerbation of pain, sternal, nonradiating, and now presents for evaluation.   Review of Systems  Positive: Chest pain Negative: Fever, syncope, nausea  Physical Exam  BP (!) 173/80 (BP Location: Left Arm)   Pulse 85   Temp 99.5 F (37.5 C)   Resp 17   SpO2 100%  Gen:   Awake, no distress   Resp:  Normal effort  MSK:   Moves extremities without difficulty  Other:  Neuro grossly intact  Medical Decision Making  Medically screening exam initiated at 10:42 AM.  Appropriate orders placed.  Ryan Jenkins was informed that the remainder of the evaluation will be completed by another provider, this initial triage assessment does not replace that evaluation, and the importance of remaining in the ED until their evaluation is complete.   Dorenda Gandy, MD 07/02/23 1043

## 2023-07-02 NOTE — Discharge Instructions (Addendum)
 Dear Mr Capano , It was a pleasure taking care of you at [facility]. We are discharging you home now that you are doing better. Please follow the following instructions.  1) You follow up imaging for your back back all negative for infections. You have been set up for home PT/OT.  They will be calling you to set up regular appointment. 2) Please continue to stick to dialysis schedule as usual  Take care,  Dr. Drue Grow, MD       Thank you for letting us  evaluate you today.  You are CT of your head and MRI of your head were negative for acute intracranial abnormalities.  You did not wish to obtain CT study today to rule out PE.  You have received dialysis today.  Please follow-up with neurology outpatient regarding weakness of your legs.  Return to Emergency Department if you experience one sided weakness, loss of sensation, slurred speech, chest pain, shortness of breath

## 2023-07-02 NOTE — ED Notes (Signed)
 Assisted PA in standing pt at the side of the bed. Pt does struggle with mobility at bedside. He sts it takes him a little to get going and walks with a walker. Pt was able to stand but feels weak in both legs. Put weight on both legs for less than 5 min and sat down. Unable to walk at this time.

## 2023-07-02 NOTE — ED Notes (Signed)
 CCMD called to admit patient for cardiac monitoring.

## 2023-07-02 NOTE — ED Notes (Signed)
 Alert, NDA, calm, interactive, no changes, VSS, to CT.

## 2023-07-02 NOTE — ED Triage Notes (Signed)
 Pt to ED via [facility] from home. Pt is Tues/Thur/Sat dialysis pt. Pt states chest pressure started yesterday, felt tender to touch on left side and radiates into left arm. Pt c/o shortness of breath and some nausea, hasn't vomited. Pt states he was also weak and unable to stand this morning with walker like he usually does. Pt states his legs feel heavy, has neuropathy that is unchanged in both legs.   20g Lhand Pt received ASA per EMS.

## 2023-07-02 NOTE — ED Notes (Signed)
 Alert, NAD, calm, interactive, VSS, to MRI via stretcher.

## 2023-07-02 NOTE — ED Provider Notes (Signed)
 Received patient in signout from previous provider pending CT, ED workup completion.  See her note  In short, patient presents to the emergency department for evaluation of sharp chest pain, shortness of breath that started this morning. Intermittent CP for past couple weeks. He is a dialysis patient and typically goes to dialysis to [facility] but did not go today due to chest pain.  Also endorses that he has been having weakness began this morning.  Wife reports that he has been having aphasia since talking on the phone this morning. This morning. Difficulty with getting up from seated position.  ED workup notable for flat troponins of 46 and 50.  Baseline 36-78 over past year likely secondary to ESRD.  Chest x-ray negative for cardiopulmonary disease.  CT pending.  UA and VBG pending.  Does not appear to need emergent dialysis at this time as he does not appear fluid overloaded no hyperkalemia.  CP: Trop neg flat and per his baseline likely d/t ESRD, chronic conditions. EKG NSR with inverted T waves in leads one and AVL per previous EKGs Has cardiologist through [facility] but his cardiologist has recently retired. Patient will need to make an appointment to follow up with them regarding CP  SHOB: low suspicion for fluid overload. CXR negative for fluid, PNA. Shared decision making is had with patient regarding obtaining CTPE to r/o PE as etiology of CP, SHOB as he recently has surgery of shunt placement in RUE on 06/21/23. No hx of clots, unilateral pedal edema, travel. At this time, patient does not wish to pursue imaging. Fortunately, pain and shob have improved. He has never been hypoxic nor tachycardic in ED. He has never required O2 supplementation.   Leg weakness: Reports subjective bilateral leg weakness but is strong and equal on my exam. Peripheral neuropathy per his baseline based on neuro note from 05/29/23. Slow to get up but able to WB equally. CT, MRI neg. No infectious symptoms nor  infection on labs. May be related to missing dialysis today. He wishes to stay till dialysis tomorrow. Spoke to nephrology Dr.  Christianne Cowper who agrees that patient does not require emergent dialysis today and can get it done tomorrow.  Physical Exam  Eyes:     General: Lids are normal. Vision grossly intact. No visual field deficit.    Extraocular Movements:     Right eye: Normal extraocular motion and no nystagmus.     Left eye: Normal extraocular motion and no nystagmus.   Pulmonary:     Effort: Pulmonary effort is normal.     Comments: Speaking full complete sentences without difficulty  Musculoskeletal:     Right lower leg: No tenderness. No edema.     Left lower leg: No tenderness. No edema.   Neurological:     Mental Status: He is oriented to person, place, and time.     GCS: GCS eye subscore is 4. GCS verbal subscore is 5. GCS motor subscore is 6.     Cranial Nerves: No dysarthria or facial asymmetry.     Motor: No weakness, tremor, abnormal muscle tone, seizure activity or pronator drift.     Coordination: Coordination normal. Finger-Nose-Finger Test and Heel to [place] Test normal.     Gait: Gait is intact. Gait normal.     Comments: No pronator drift. Grip strength equal bilaterally. Sensation 1/2 of at feet bilaterally per baseline neuropathy. Sensation 2/2 of BUE. Motor 5/5 of BLE and BUE. Able to WB equally   DC pending  dialysis tomorrow. Following dialysis, patient is medically clear to be DC to follow up with neurology, PCP outpatient    Royann Cords, PA 07/02/23 2358    Tegeler, Marine Sia, MD 07/03/23 0000

## 2023-07-02 NOTE — ED Provider Notes (Signed)
[place] EMERGENCY DEPARTMENT AT [facility] Provider Note   CSN: [id number] Arrival date & time: 07/02/23  1026     Patient presents with: Chest Pain   SAN LOHMEYER is a 63 y.o. male with a history of ESRD on dialysis, diabetes mellitus, hypertension, and CHF who presents the ED today for multiple concerns.  Patient reports intermittent chest pain for the past week, this morning he had sharp pains on the right side of the chest which is what brought him in today.  Also endorses worsening shortness of breath. He was supposed to have dialysis this morning but came here instead due to his chest pain. Also endorses generalized weakness.  States that he has difficulty getting out of bed that began this morning.  Does not know if this could be due to his neuropathy.  Family member at bedside states that his speech sounds more pressured than before.  No aphasia or slurred speech.  No recent falls or head injuries.  Denies nausea, vomiting, dysuria, or diarrhea.  Also reports that his blood sugars have been in the 400-500s for the past several months.     Prior to Admission medications   Medication Sig Start Date End Date Taking? Authorizing Provider  acetaminophen  (TYLENOL ) 650 MG CR tablet Take 650 mg by mouth every 8 (eight) hours as needed for pain.    [provider]  amLODipine  (NORVASC ) 5 MG tablet Take 5 mg by mouth daily. 11/22/22   [provider]  B Complex-C-Folic Acid  (RENAL-VITE) 0.8 MG TABS Take 1 tablet by mouth daily.    [provider]  calcium  acetate (PHOSLO) 667 MG capsule Take 1,334 mg by mouth 3 (three) times daily with meals.    [provider]  famotidine  (PEPCID ) 40 MG tablet Take 40 mg by mouth daily as needed for heartburn or indigestion. Complete    [provider]  furosemide  (LASIX ) 40 MG tablet Take 40 mg by mouth daily. 05/24/22   [provider]  gabapentin  (NEURONTIN ) 300 MG capsule Take 300 mg by  mouth every Monday, Wednesday, and Friday at 8 PM. After diaysis Patient not taking: Reported on 05/29/2023 11/22/22   [provider]  hydrALAZINE  (APRESOLINE ) 100 MG tablet Take 100 mg by mouth 2 (two) times daily.    [provider]  LANTUS  SOLOSTAR 100 UNIT/ML Solostar Pen Inject 5 Units into the skin at bedtime. Patient taking differently: Inject 15 Units into the skin at bedtime. 03/29/22   Hoyt Macleod, MD  meclizine  (ANTIVERT ) 25 MG tablet Take 25 mg by mouth at bedtime. 01/10/23   [provider]  metoprolol  succinate (TOPROL -XL) 25 MG 24 hr tablet Take 0.5 tablets (12.5 mg total) by mouth daily. 03/30/22 08/19/24  Hoyt Macleod, MD  pioglitazone (ACTOS) 15 MG tablet Take 15 mg by mouth every morning. 10/25/22   [provider]  polyethylene glycol (MIRALAX  / GLYCOLAX ) 17 g packet Take 17 g by mouth daily as needed for mild constipation.    [provider]  rosuvastatin  (CRESTOR ) 10 MG tablet Take 10 mg by mouth at bedtime.    [provider]    Allergies: Latex, Loratadine, Hydrochlorothiazide , Lyrica [pregabalin], and Metformin and related    Review of Systems  Cardiovascular:  Positive for chest pain.  Neurological:  Positive for weakness.  All other systems reviewed and are negative.   Updated Vital Signs BP (!) 205/89   Pulse 77   Temp 98.3 F (36.8 C) (Oral)  Resp 18   Ht 6' 2 (1.88 m)   Wt 95 kg   SpO2 100%   BMI 26.89 kg/m   Physical Exam Vitals and nursing note reviewed.  Constitutional:      General: He is not in acute distress.    Appearance: Normal appearance.  HENT:     Head: Normocephalic and atraumatic.     Mouth/Throat:     Mouth: Mucous membranes are moist.   Eyes:     Conjunctiva/sclera: Conjunctivae normal.     Pupils: Pupils are equal, round, and reactive to light.    Cardiovascular:     Rate and Rhythm: Normal rate and regular rhythm.     Pulses: Normal pulses.  Pulmonary:     Effort:  Pulmonary effort is normal.     Breath sounds: Normal breath sounds.  Abdominal:     Palpations: Abdomen is soft.     Tenderness: There is no abdominal tenderness.   Skin:    General: Skin is warm and dry.     Findings: No rash.   Neurological:     General: No focal deficit present.     Mental Status: He is alert.   Psychiatric:        Mood and Affect: Mood normal.        Behavior: Behavior normal.     (all labs ordered are listed, but only abnormal results are displayed) Labs Reviewed  BASIC METABOLIC PANEL WITH GFR - Abnormal; Notable for the following components:      Result Value   Chloride 97 (*)    Glucose, Bld 363 (*)    BUN 25 (*)    Creatinine, Ser 5.27 (*)    GFR, Estimated 12 (*)    All other components within normal limits  CBC - Abnormal; Notable for the following components:   RBC 3.63 (*)    Hemoglobin 11.4 (*)    HCT 33.5 (*)    All other components within normal limits  TROPONIN I (HIGH SENSITIVITY) - Abnormal; Notable for the following components:   Troponin I (High Sensitivity) 46 (*)    All other components within normal limits  TROPONIN I (HIGH SENSITIVITY) - Abnormal; Notable for the following components:   Troponin I (High Sensitivity) 50 (*)    All other components within normal limits  URINALYSIS, W/ REFLEX TO CULTURE (INFECTION SUSPECTED)  I-STAT VENOUS BLOOD GAS, ED    EKG: EKG Interpretation Date/Time:  Tuesday July 02 2023 10:43:40 EDT Ventricular Rate:  84 PR Interval:  202 QRS Duration:  112 QT Interval:  422 QTC Calculation: 498 R Axis:   -26  Text Interpretation: Normal sinus rhythm Left ventricular hypertrophy with repolarization abnormality ( R in aVL , Cornell product ) Cannot rule out Septal infarct , age undetermined Abnormal ECG When compared with ECG of 28-Mar-2023 13:49, PREVIOUS ECG IS PRESENT Confirmed by Rosealee Concha ([id number]) on 07/02/2023 2:02:08 PM  Radiology: CT Head Wo Contrast Result Date: 07/02/2023 CLINICAL  DATA:  Neuro deficit, acute, stroke suspected EXAM: CT HEAD WITHOUT CONTRAST TECHNIQUE: Contiguous axial images were obtained from the base of the skull through the vertex without intravenous contrast. RADIATION DOSE REDUCTION: This exam was performed according to the departmental dose-optimization program which includes automated exposure control, adjustment of the mA and/or kV according to patient size and/or use of iterative reconstruction technique. COMPARISON:  04/04/2022 FINDINGS: Brain: No acute intracranial abnormality. Specifically, no hemorrhage, hydrocephalus, mass lesion, acute infarction, or significant intracranial injury. Mild cerebral atrophy.  Vascular: No hyperdense vessel or unexpected calcification. Skull: No acute calvarial abnormality. Sinuses/Orbits: No acute findings Other: None IMPRESSION: No acute intracranial abnormality. Electronically Signed   By: Janeece Mechanic M.D.   On: 07/02/2023 15:38   DG Chest 1 View Result Date: 07/02/2023 CLINICAL DATA:  Chest pain EXAM: CHEST  1 VIEW portable COMPARISON:  X-ray 03/28/2023 FINDINGS: No consolidation, pneumothorax or effusion. Normal cardiopericardial silhouette without edema. Artifact from overlapping gown snaps. Films are under penetrated. IMPRESSION: No acute cardiopulmonary disease. Electronically Signed   By: Adrianna Horde M.D.   On: 07/02/2023 12:55     Procedures   Medications Ordered in the ED  amLODipine  (NORVASC ) tablet 5 mg (5 mg Oral Given 07/02/23 1455)  furosemide  (LASIX ) tablet 40 mg (40 mg Oral Given 07/02/23 1455)  hydrALAZINE  (APRESOLINE ) tablet 100 mg (100 mg Oral Given 07/02/23 1454)                                    Medical Decision Making Amount and/or Complexity of Data Reviewed Radiology: ordered.  Risk Prescription drug management.   This patient presents to the ED for concern of chest pain and weakness, this involves an extensive number of treatment options, and is a complaint that carries with it a  high risk of complications and morbidity.   Differential diagnosis includes: ACS, PE, UTI, dehydration, electrolyte abnormality, AKI, pneumonia, sepsis, stroke, etc.   Comorbidities  See HPI above   Additional History  Additional history obtained from prior records   Cardiac Monitoring / EKG  The patient was maintained on a cardiac monitor.  I personally viewed and interpreted the cardiac monitored which showed: NSR with a heart rate of 84 bpm.   Lab Tests  I ordered and personally interpreted labs.  The pertinent results include:   Initial troponin of 46, delta of 15 Glucose of 363 on BMP, otherwise within normal limits for patient CBC within normal limits VBG pending UA pending   Imaging Studies  I ordered imaging studies including CXR and CT head  I independently visualized and interpreted imaging which showed:  CT head is reassuring CXR shows no acute cardiopulmonary disease. I agree with the radiologist interpretation   Problem List / ED Course / Critical Interventions / Medication Management  Patient reports right sided chest pain intermittently for the past week with associated shortness of breath. This morning the pain was sharp and didn't go away, which prompted him to call EMS. He missed his dialysis today to come to the ED for evaluation. Also endorses new weakness. He had a had time getting up out of bed and out of chairs. Doesn't know if this is associated with his neuropathy or not. Family at bedside says that his speech is slower as well. No aphasia or slurred speech appreciated. No focal neuro deficits on exam. I ordered medications including: Hydralazine , amlodipine , and furosemide  for home BP meds Fentanyl  for chest pain I have reviewed the patients home medicines and have made adjustments as needed   Social Determinants of Health  Physical activity   Test / Admission - Considered  Patient care signed out to oncoming provider at shift change.  Disposition pending labs and imaging.    Final diagnoses:  None    ED Discharge Orders     None          Sonnie Dusky, PA-C 07/03/23 2130    Rosealee Concha, MD  07/04/23 0855  

## 2023-07-03 ENCOUNTER — Observation Stay (HOSPITAL_COMMUNITY)

## 2023-07-03 DIAGNOSIS — M5441 Lumbago with sciatica, right side: Secondary | ICD-10-CM

## 2023-07-03 DIAGNOSIS — E1122 Type 2 diabetes mellitus with diabetic chronic kidney disease: Secondary | ICD-10-CM

## 2023-07-03 DIAGNOSIS — N186 End stage renal disease: Secondary | ICD-10-CM | POA: Diagnosis not present

## 2023-07-03 DIAGNOSIS — Z794 Long term (current) use of insulin: Secondary | ICD-10-CM

## 2023-07-03 DIAGNOSIS — G8929 Other chronic pain: Secondary | ICD-10-CM | POA: Diagnosis not present

## 2023-07-03 DIAGNOSIS — Z992 Dependence on renal dialysis: Secondary | ICD-10-CM

## 2023-07-03 DIAGNOSIS — R531 Weakness: Principal | ICD-10-CM

## 2023-07-03 LAB — HEMOGLOBIN A1C
Hgb A1c MFr Bld: 10.6 % — ABNORMAL HIGH (ref 4.8–5.6)
Mean Plasma Glucose: 257.52 mg/dL

## 2023-07-03 LAB — GLUCOSE, CAPILLARY
Glucose-Capillary: 229 mg/dL — ABNORMAL HIGH (ref 70–99)
Glucose-Capillary: 265 mg/dL — ABNORMAL HIGH (ref 70–99)
Glucose-Capillary: 101 mg/dL — ABNORMAL HIGH (ref 70–99)

## 2023-07-03 LAB — URINALYSIS, W/ REFLEX TO CULTURE (INFECTION SUSPECTED)
Bilirubin Urine: NEGATIVE
Glucose, UA: >=500 mg/dL — AB
Ketones, ur: NEGATIVE mg/dL
Leukocytes,Ua: NEGATIVE
Nitrite: NEGATIVE
Protein, ur: 100 mg/dL — AB
Specific Gravity, Urine: 1.012 (ref 1.005–1.030)
pH: 5 (ref 5.0–8.0)

## 2023-07-03 LAB — TROPONIN I (HIGH SENSITIVITY): Troponin I (High Sensitivity): 40 ng/L — ABNORMAL HIGH (ref <18)

## 2023-07-03 LAB — HEPATITIS B SURFACE ANTIGEN: Hepatitis B Surface Ag: NONREACTIVE

## 2023-07-03 LAB — HIV ANTIBODY (ROUTINE TESTING W REFLEX): HIV Screen 4th Generation wRfx: NONREACTIVE

## 2023-07-03 MED ORDER — INSULIN GLARGINE-YFGN 100 UNIT/ML ~~LOC~~ SOLN
20.0000 [IU] | Freq: Every day | SUBCUTANEOUS | Status: DC
  Administered 2023-07-03 – 2023-07-04 (×2): 20 [IU] via SUBCUTANEOUS
  Filled 2023-07-03 (×2): qty 0.2

## 2023-07-03 MED ORDER — INSULIN ASPART 100 UNIT/ML IJ SOLN: 0.0000 [IU] | Freq: Every day | INTRAMUSCULAR | Status: DC

## 2023-07-03 MED ORDER — ROSUVASTATIN CALCIUM 5 MG PO TABS
10.0000 mg | ORAL_TABLET | Freq: Every day | ORAL | Status: DC
  Administered 2023-07-03: 10 mg via ORAL
  Filled 2023-07-03: qty 2

## 2023-07-03 MED ORDER — ACETAMINOPHEN ER 650 MG PO TBCR: 650.0000 mg | EXTENDED_RELEASE_TABLET | Freq: Four times a day (QID) | ORAL | Status: DC

## 2023-07-03 MED ORDER — HYDRALAZINE HCL 100 MG PO TABS: 100.0000 mg | ORAL_TABLET | Freq: Once | ORAL | Status: DC

## 2023-07-03 MED ORDER — ALUM & MAG HYDROXIDE-SIMETH 200-200-20 MG/5ML PO SUSP
30.0000 mL | ORAL | Status: DC | PRN
  Administered 2023-07-03: 30 mL via ORAL
  Filled 2023-07-03: qty 30

## 2023-07-03 MED ORDER — INSULIN ASPART 100 UNIT/ML IJ SOLN
0.0000 [IU] | Freq: Three times a day (TID) | INTRAMUSCULAR | Status: DC
  Administered 2023-07-03: 3 [IU] via SUBCUTANEOUS
  Administered 2023-07-04 (×3): 1 [IU] via SUBCUTANEOUS

## 2023-07-03 MED ORDER — HEPARIN SODIUM (PORCINE) 5000 UNIT/ML IJ SOLN
5000.0000 [IU] | Freq: Three times a day (TID) | INTRAMUSCULAR | Status: DC
  Administered 2023-07-03 – 2023-07-04 (×4): 5000 [IU] via SUBCUTANEOUS
  Filled 2023-07-03 (×4): qty 1

## 2023-07-03 MED ORDER — LIDOCAINE 5 % EX PTCH
1.0000 | MEDICATED_PATCH | CUTANEOUS | Status: DC
  Administered 2023-07-03 – 2023-07-04 (×2): 1 via TRANSDERMAL
  Filled 2023-07-03 (×2): qty 1

## 2023-07-03 MED ORDER — HYDROMORPHONE HCL 2 MG PO TABS: 2.0000 mg | ORAL_TABLET | Freq: Four times a day (QID) | ORAL | Status: DC | PRN

## 2023-07-03 MED ORDER — AMLODIPINE BESYLATE 5 MG PO TABS
5.0000 mg | ORAL_TABLET | Freq: Every day | ORAL | Status: DC
  Administered 2023-07-03 – 2023-07-04 (×2): 5 mg via ORAL
  Filled 2023-07-03 (×2): qty 1

## 2023-07-03 MED ORDER — HYDRALAZINE HCL 100 MG PO TABS: 100.0000 mg | ORAL_TABLET | Freq: Two times a day (BID) | ORAL | Status: DC

## 2023-07-03 MED ORDER — RENA-VITE PO TABS
1.0000 | ORAL_TABLET | Freq: Every day | ORAL | Status: DC
  Administered 2023-07-03: 1 via ORAL
  Filled 2023-07-03: qty 1

## 2023-07-03 MED ORDER — ORAL CARE MOUTH RINSE: 15.0000 mL | OROMUCOSAL | Status: DC | PRN

## 2023-07-03 MED ORDER — ACETAMINOPHEN ER 650 MG PO TBCR: 650.0000 mg | EXTENDED_RELEASE_TABLET | Freq: Three times a day (TID) | ORAL | Status: DC | PRN

## 2023-07-03 MED ORDER — CHLORHEXIDINE GLUCONATE CLOTH 2 % EX PADS
6.0000 | MEDICATED_PAD | Freq: Every day | CUTANEOUS | Status: DC
  Administered 2023-07-04: 6 via TOPICAL

## 2023-07-03 MED ORDER — METOPROLOL SUCCINATE ER 25 MG PO TB24
12.5000 mg | ORAL_TABLET | Freq: Every day | ORAL | Status: DC
  Administered 2023-07-03 – 2023-07-04 (×2): 12.5 mg via ORAL
  Filled 2023-07-03 (×2): qty 1

## 2023-07-03 MED ORDER — ACETAMINOPHEN 325 MG PO TABS
650.0000 mg | ORAL_TABLET | Freq: Four times a day (QID) | ORAL | Status: DC
  Administered 2023-07-03 – 2023-07-04 (×4): 650 mg via ORAL
  Filled 2023-07-03 (×5): qty 2

## 2023-07-03 MED ORDER — INSULIN GLARGINE 100 UNITS/ML SOLOSTAR PEN
20.0000 [IU] | PEN_INJECTOR | SUBCUTANEOUS | Status: DC
  Filled 2023-07-03: qty 3

## 2023-07-03 MED ORDER — DICLOFENAC SODIUM 1 % EX GEL
2.0000 g | Freq: Three times a day (TID) | CUTANEOUS | Status: DC | PRN
  Administered 2023-07-03: 2 g via TOPICAL
  Filled 2023-07-03 (×2): qty 100

## 2023-07-03 NOTE — Consult Note (Signed)
 Reason for Consult: To manage dialysis and dialysis related needs Referring Physician: Sonnie Dusky, PA-C  Ryan Jenkins is an 63 y.o. male.  HPI: PT is a 68M with a PMH sig for ESRD on HD TTS, CAD, HTN, HLD, DM II who is now seen in consultation at the request of Sonnie Dusky, paC for provision of HD and management of ESRD.    Pt presented to ED Tuesday after having CP, abd pain, and barely being able to walk.  He describes a 2 month history of worsening mobility.  Was able to participate in PT previously but is having an increasingly hard time.  Came to ED.  Trops flat, EKG reassuring, CT head and MRI head negative.  In this setting we are asked to see,  Pt report that he's concerned about how he will get around at home and transfer from wheelchair to HD chair if he can't walk/    Dialyzes at [facility] TTS  Past Medical History:  Diagnosis Date   Anemia    Anginal pain (HCC)    Anxiety    Arthritis    BPH with obstruction/lower urinary tract symptoms    CAD (coronary artery disease)    cardiologist--- dr Adline Hook badal;  11-06-2019 cardiac cath in [place]  (result in care everywhere)  nonobstructive cad involing pRCA 60% (done in setting worseing CHF/ acute pulmonary edema requiring intubation/ AKI   Chronic combined systolic and diastolic CHF (congestive heart failure) (HCC)    Chronic kidney disease, stage IV (severe) (HCC)    nephrologist--- dr Edson Graces   Depression    Diabetic neuropathy (HCC)    Dyspnea    Edema of both lower extremities    Gastric ulcer without hemorrhage or perforation 12/25/2018   GERD (gastroesophageal reflux disease)    Heart murmur    Hemodialysis patient (HCC)    History of community acquired pneumonia    ad[facility]04-20[facility]  w/ ARF hypoxia w/ severe sepsis   Hyperlipidemia    Hypertension    Insulin  dependent type 2 diabetes mellitus (HCC)    LBBB (left bundle branch block)    Pneumonia    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)     Uses walker    Vitamin B12 deficiency    Vitreous hemorrhage [facility])     Past Surgical History:  Procedure Laterality Date   A/V FISTULAGRAM Right 07/17/2022   Procedure: A/V Fistulagram;  Surgeon: Dannis Dy, MD;  Location: [facility] INVASIVE CV LAB;  Service: Vascular;  Laterality: Right;   A/V FISTULAGRAM N/A 02/01/2023   Procedure: A/V Fistulagram;  Surgeon: Melodie Spry, MD;  Location: [facility] INVASIVE CV LAB;  Service: Cardiovascular;  Laterality: N/A;   A/V SHUNT INTERVENTION N/A 05/10/2023   Procedure: A/V SHUNT INTERVENTION;  Surgeon: Patrick Boor, MD;  Location: [facility] INVASIVE CV LAB;  Service: Cardiovascular;  Laterality: N/A;   A/V SHUNT INTERVENTION N/A 06/21/2023   Procedure: A/V SHUNT INTERVENTION;  Surgeon: Margherita Shell, MD;  Location: [facility] PV LAB;  Service: Cardiovascular;  Laterality: N/A;   AV FISTULA PLACEMENT Right 05/24/2022   Procedure: RADIOCEPHALIC ARTERIOVENOUS (AV) FISTULA CREATION;  Surgeon: Kayla Part, MD;  Location: [facility] OR;  Service: Vascular;  Laterality: Right;   CARDIAC CATHETERIZATION  11/06/2019   [facility] in [place] ;    nonobstructive CAD , pRCA 60% (result in care everywhere)   CATARACT EXTRACTION W/ INTRAOCULAR LENS IMPLANT Bilateral 2017   IR FLUORO GUIDE CV LINE RIGHT  05/23/2022  IR US  GUIDE VASC ACCESS RIGHT  05/23/2022   PATCH ANGIOPLASTY Right 09/18/2022   Procedure: BOVINE PATCH ANGIOPLASTY 1CM x 6CM;  Surgeon: Dannis Dy, MD;  Location: [facility] OR;  Service: Vascular;  Laterality: Right;   PERIPHERAL VASCULAR BALLOON ANGIOPLASTY Right 02/01/2023   Procedure: PERIPHERAL VASCULAR BALLOON ANGIOPLASTY;  Surgeon: Melodie Spry, MD;  Location: [facility] INVASIVE CV LAB;  Service: Cardiovascular;  Laterality: Right;  AA and Cphalic inflow vein   REVISON OF ARTERIOVENOUS FISTULA Right 09/18/2022   Procedure: REVISON OF RIGHT UPPER ARM ARTERIOVENOUS FISTULA;  Surgeon: Dannis Dy, MD;  Location: [facility] OR;  Service: Vascular;   Laterality: Right;   TRANSURETHRAL RESECTION OF PROSTATE N/A 10/16/2021   Procedure: TRANSURETHRAL RESECTION OF THE PROSTATE (TURP);  Surgeon: Lahoma Pigg, MD;  Location: [facility] ORS;  Service: Urology;  Laterality: N/A;   VENOUS ANGIOPLASTY Right 05/10/2023   Procedure: VENOUS ANGIOPLASTY;  Surgeon: Patrick Boor, MD;  Location: [facility]  INVASIVE CV LAB;  Service: Cardiovascular;  Laterality: Right;  70% Cephalic Arch    Family History  Problem Relation Age of Onset   Renal cancer Mother    Hypertension Mother    Pancreatic cancer Mother    Hypertension Sister    Stroke Sister    Leukemia Maternal Uncle    Sickle cell trait Maternal Aunt    Colon cancer Neg Hx    Esophageal cancer Neg Hx    Rectal cancer Neg Hx    Stomach cancer Neg Hx     Social History:  reports that he has never smoked. He has never been exposed to tobacco smoke. He has never used smokeless tobacco. He reports that he does not drink alcohol and does not use drugs.  Allergies:  Allergies  Allergen Reactions   Latex Hives and Swelling    catheter   Loratadine Swelling and Other (See Comments)    Joint swelling   Hydrochlorothiazide  Other (See Comments)    Dizziness   Lyrica [Pregabalin] Other (See Comments)    Depression. Makes me loopy    Metformin And Related Diarrhea and Nausea And Vomiting    Medications: Scheduled:  Chlorhexidine  Gluconate Cloth  6 each Topical Q0600     Results for orders placed or performed during the hospital encounter of 07/02/23 (from the past 48 hours)  Basic metabolic panel     Status: Abnormal   Collection Time: 07/02/23 10:44 AM  Result Value Ref Range   Sodium 136 135 - 145 mmol/L   Potassium 4.2 3.5 - 5.1 mmol/L    Comment: HEMOLYSIS AT THIS LEVEL MAY AFFECT RESULT   Chloride 97 (L) 98 - 111 mmol/L   CO2 27 22 - 32 mmol/L   Glucose, Bld 363 (H) 70 - 99 mg/dL    Comment: Glucose reference range applies only to samples taken after fasting for at least 8 hours.   BUN 25  (H) 8 - 23 mg/dL   Creatinine, Ser 4.09 (H) 0.61 - 1.24 mg/dL   Calcium  9.1 8.9 - 10.3 mg/dL   GFR, Estimated 12 (L) >60 mL/min    Comment: (NOTE) Calculated using the CKD-EPI Creatinine Equation (2021)    Anion gap 12 5 - 15    Comment: Performed at [facility] Lab, [address], [place], [place] [address]  CBC     Status: Abnormal   Collection Time: 07/02/23 10:44 AM  Result Value Ref Range   WBC 5.3 4.0 - 10.5 K/uL   RBC 3.63 (L)  4.22 - 5.81 MIL/uL   Hemoglobin 11.4 (L) 13.0 - 17.0 g/dL   HCT 56.2 (L) 13.0 - 86.5 %   MCV 92.3 80.0 - 100.0 fL   MCH 31.4 26.0 - 34.0 pg   MCHC 34.0 30.0 - 36.0 g/dL   RDW 78.4 69.6 - 29.5 %   Platelets 290 150 - 400 K/uL   nRBC 0.0 0.0 - 0.2 %    Comment: Performed at [facility] Lab, [address], [place], [place] [address]  Troponin I (High Sensitivity)     Status: Abnormal   Collection Time: 07/02/23 10:44 AM  Result Value Ref Range   Troponin I (High Sensitivity) 46 (H) <18 ng/L    Comment: (NOTE) Elevated high sensitivity troponin I (hsTnI) values and significant  changes across serial measurements may suggest ACS but many other  chronic and acute conditions are known to elevate hsTnI results.  Refer to the Links section for chest pain algorithms and additional  guidance. Performed at [facility], [address], [place], [place] [address]   Troponin I (High Sensitivity)     Status: Abnormal   Collection Time: 07/02/23 12:59 PM  Result Value Ref Range   Troponin I (High Sensitivity) 50 (H) <18 ng/L    Comment: (NOTE) Elevated high sensitivity troponin I (hsTnI) values and significant  changes across serial measurements may suggest ACS but many other  chronic and acute conditions are known to elevate hsTnI results.  Refer to the Links section for chest pain algorithms and additional  guidance. Performed at [facility], [address] [place], [place] [address]   I-Stat venous blood gas, [facility] ED, [facility],  [facility])     Status: Abnormal   Collection Time: 07/02/23  2:40 PM  Result Value Ref Range   pH, Ven 7.519 (H) 7.25 - 7.43   pCO2, Ven 34.8 (L) 44 - 60 mmHg   pO2, Ven 130 (H) 32 - 45 mmHg   Bicarbonate 28.4 (H) 20.0 - 28.0 mmol/L   TCO2 29 22 - 32 mmol/L   O2 Saturation 99 %   Acid-Base Excess 5.0 (H) 0.0 - 2.0 mmol/L   Sodium 136 135 - 145 mmol/L   Potassium 3.0 (L) 3.5 - 5.1 mmol/L   Calcium , Ion 1.04 (L) 1.15 - 1.40 mmol/L   HCT 33.0 (L) 39.0 - 52.0 %   Hemoglobin 11.2 (L) 13.0 - 17.0 g/dL   Sample type VENOUS   Urinalysis, w/ Reflex to Culture (Infection Suspected) -Urine, Clean Catch     Status: Abnormal   Collection Time: 07/03/23  4:11 AM  Result Value Ref Range   Specimen Source URINE, CLEAN CATCH    Color, Urine YELLOW YELLOW   APPearance HAZY (A) CLEAR   Specific Gravity, Urine 1.012 1.005 - 1.030   pH 5.0 5.0 - 8.0   Glucose, UA >=500 (A) NEGATIVE mg/dL   Hgb urine dipstick SMALL (A) NEGATIVE   Bilirubin Urine NEGATIVE NEGATIVE   Ketones, ur NEGATIVE NEGATIVE mg/dL   Protein, ur 366 (A) NEGATIVE mg/dL   Nitrite NEGATIVE NEGATIVE   Leukocytes,Ua NEGATIVE NEGATIVE   RBC / HPF 0-5 0 - 5 RBC/hpf   WBC, UA 0-5 0 - 5 WBC/hpf    Comment:        Reflex urine culture not performed if WBC <=10, OR if Squamous epithelial cells >5. If Squamous epithelial cells >5 suggest recollection.    Bacteria, UA RARE (A) NONE SEEN   Squamous Epithelial /  HPF 0-5 0 - 5 /HPF   Mucus PRESENT    Hyaline Casts, UA PRESENT     Comment: Performed at [facility] Lab, [address] [place], [place] [address]    MR BRAIN WO CONTRAST Result Date: 07/02/2023 CLINICAL DATA:  Neuro deficit, concern for stroke. EXAM: MRI HEAD WITHOUT CONTRAST TECHNIQUE: Multiplanar, multiecho pulse sequences of the brain and surrounding structures were obtained without intravenous contrast. COMPARISON:  Same day CT head. FINDINGS: Brain: No acute infarct. No evidence of intracranial hemorrhage. T2/FLAIR  hyperintensity in the periventricular white matter. No edema, mass effect, or midline shift. Posterior fossa is unremarkable. Normal appearance of midline structures. The basilar cisterns are patent. No extra-axial fluid collections. Ventricles: Normal size and configuration of the ventricles. Vascular: Skull base flow voids are visualized. Skull and upper cervical spine: No focal abnormality. Sinuses/Orbits: Bilateral lens replacement. No significant mucosal thickening in the paranasal sinuses. Other: Right mastoid effusion. IMPRESSION: No acute intracranial abnormality. Mild chronic microvascular ischemic changes. Right mastoid effusion. Electronically Signed   By: Denny Flack M.D.   On: 07/02/2023 18:08   CT Head Wo Contrast Result Date: 07/02/2023 CLINICAL DATA:  Neuro deficit, acute, stroke suspected EXAM: CT HEAD WITHOUT CONTRAST TECHNIQUE: Contiguous axial images were obtained from the base of the skull through the vertex without intravenous contrast. RADIATION DOSE REDUCTION: This exam was performed according to the departmental dose-optimization program which includes automated exposure control, adjustment of the mA and/or kV according to patient size and/or use of iterative reconstruction technique. COMPARISON:  04/04/2022 FINDINGS: Brain: No acute intracranial abnormality. Specifically, no hemorrhage, hydrocephalus, mass lesion, acute infarction, or significant intracranial injury. Mild cerebral atrophy. Vascular: No hyperdense vessel or unexpected calcification. Skull: No acute calvarial abnormality. Sinuses/Orbits: No acute findings Other: None IMPRESSION: No acute intracranial abnormality. Electronically Signed   By: Janeece Mechanic M.D.   On: 07/02/2023 15:38   DG Chest 1 View Result Date: 07/02/2023 CLINICAL DATA:  Chest pain EXAM: CHEST  1 VIEW portable COMPARISON:  X-ray 03/28/2023 FINDINGS: No consolidation, pneumothorax or effusion. Normal cardiopericardial silhouette without edema. Artifact  from overlapping gown snaps. Films are under penetrated. IMPRESSION: No acute cardiopulmonary disease. Electronically Signed   By: Adrianna Horde M.D.   On: 07/02/2023 12:55    ROS: all other systems reviewed and are negative except as per HPI Blood pressure (!) 144/86, pulse 100, temperature 98.2 F (36.8 C), resp. rate 15, height 6' 2 (1.88 m), weight (S) 121 kg, SpO2 100%. GEN nad HEENT eomi perrl NECK no jvd PULM clear CV rrr ABD soft EXT no le edema NEURO AAO x 3  ACCESS: R AVF + T/b  Assessment/Plan: 1 CP/ abd pain: trops flat, EKG OK 2.  Back pain/ mobility issues: this is most concerning to me- this is a pt who is very independent as he can be at baseline- MRI showing foraminal stenosis in March but worsening back pain with increased inability to move in a dialysis pt makes me concerned for possible discitis/ hematogenous spread of bacteria.  Additionally safety risk considering decreased mobility- have d/w EDP- will admit, get MRI T and L spine.  Will also need PT/OT eval too.  Greatly appreciate assistance 3 ESRD: TTS normally= HD today off schedule and then back to normal Schedule Thursday 4 Hypertension: reasonably well-controlled 5 Anemia of ESRD: Hgb OK for now 6 Metabolic Bone Disease:  binders/ vits when eating  Dispo: being admitted  Carey Lafon, Nellie Banas 07/03/2023, 9:54 AM

## 2023-07-03 NOTE — Care Management Obs Status (Signed)
 MEDICARE OBSERVATION STATUS NOTIFICATION   Patient Details  Name: Ryan Jenkins MRN: [id number] Date of Birth: [birth year]   Medicare Observation Status Notification Given:     Spoke with Patient in his room obs explained and advised to mail copy to his home address  Wynonia Hedges 07/03/2023, 2:56 PM

## 2023-07-03 NOTE — Progress Notes (Signed)
 NEW ADMISSION NOTE New Admission Note:   Arrival Method: stretcher Mental Orientation: alert and oriented x 4 Telemetry: none Assessment: Completed Skin: intact IV: PIV NSL Pain: see flowsheet Tubes: none Safety Measures: Safety Fall Prevention Plan has been given, discussed and signed Admission: Completed [facility] Orientation: Patient has been orientated to the room, unit and staff.  Family: brother present  Orders have been reviewed and implemented. Will continue to monitor the patient. Call light has been placed within reach and bed alarm has been activated. Pt came to us  from HD and hungry. Tylenol  and lidocaine  patch allowed him to sleep/rest.   Johnsie Nani, RN

## 2023-07-03 NOTE — Plan of Care (Signed)
  Problem: Education: Goal: Ability to describe self-care measures that may prevent or decrease complications (Diabetes Survival Skills Education) will improve Outcome: Progressing Goal: Individualized Educational Video(s) Outcome: Progressing   Problem: Coping: Goal: Ability to adjust to condition or change in health will improve Outcome: Progressing   Problem: Activity: Goal: Risk for activity intolerance will decrease Outcome: Not Progressing   Problem: Pain Managment: Goal: General experience of comfort will improve and/or be controlled Outcome: Not Progressing   Back pain being evaluated.

## 2023-07-03 NOTE — Progress Notes (Signed)
   07/03/23 1236  Vitals  Temp 97.8 F (36.6 C)  Pulse Rate 88  Resp (!) 22  BP (!) 182/94  SpO2 100 %  Weight (S)   (Non-weight bed)  Type of Weight Post-Dialysis  Oxygen Therapy  Patient Activity (if Appropriate) In bed  Post Treatment  Dialyzer Clearance Clear  Hemodialysis Intake (mL) 0 mL  Liters Processed 68.9  Fluid Removed (mL) 1100 mL  Tolerated HD Treatment Yes  Post-Hemodialysis Comments Pt. resting and talking with MD and associates  AVG/AVF Arterial Site Held (minutes) 5 minutes  AVG/AVF Venous Site Held (minutes) 5 minutes   Received patient in bed to unit.  Alert and oriented.  Informed consent signed and in chart.   TX duration: 3.5  Patient tolerated well.  Transported back to the room  Alert, without acute distress.  Hand-off given to patient's nurse.   Access used: Yes Access issues: No  Total UF removed: 1100, unable to removed previous UF goal, D/ T decrease SBP Medication(s) given: No medication given Post HD VS: See Above Grid Post HD weight: Non Weight Bed   [facility]

## 2023-07-03 NOTE — Procedures (Signed)
 Patient seen and examined on Hemodialysis. The procedure was supervised and I have made appropriate changes. BP (!) 144/86   Pulse 100   Temp 98.2 F (36.8 C)   Resp 15   Ht 6' 2 (1.88 m)   Wt (S) 121 kg Comment: pt did standing scale  SpO2 100%   BMI 34.25 kg/m   QB 400 mL/min via AVF, UF goal 3L  Tolerating treatment without complaints at this time.   Leandra Pro MD [facility] [phone] 10:06 AM

## 2023-07-03 NOTE — TOC CM/SW Note (Signed)
 Transition of Care [facility]) - Inpatient Brief Assessment   Patient Details  Name: Ryan Jenkins MRN: [id number] Date of Birth: 10-15-1960  Transition of Care [facility]) CM/SW Contact:    Juliane Och, LCSW Phone Number: 07/03/2023, 3:49 PM   Clinical Narrative:  3:49 PM Per chart review, patient resides at home with relatives. Patient has a PCP and insurance. Patient does not have SNF history. Patient has HH history with [facility]. Patient has DME history (rolling walker, wheelchair, BSC, shower stool). Patient's preferred pharmacy is [facility] [phone] [facility]. No TOC needs were identified at this time. TOC will continue to follow and be available to assist.  Transition of Care Asessment: Insurance and Status: Insurance coverage has been reviewed Patient has primary care physician: Yes Home environment has been reviewed: Private Residence Prior level of function:: N/A Prior/Current Home Services: No current home services (Has HH/DME history) Social Drivers of Health Review: SDOH reviewed no interventions necessary Readmission risk has been reviewed: Yes Transition of care needs: no transition of care needs at this time

## 2023-07-03 NOTE — Hospital Course (Signed)
  A 63 year old male with a history of type 2 diabetes mellitus (on insulin ) complicated by retinopathy and end-stage renal disease (since 2025) presented to the emergency department after experiencing chest pain and weakness severe enough to prevent him from completing dressing. He was recently evaluated during hemodialysis for concerns of discitis. On examination, he had mild tenderness over the lower back but showed no signs of systemic infection. He reported vague, radiating pain starting from his right ear down his back to the lower back. Troponin levels were unremarkable, and a CT head scan revealed no acute intracranial abnormalities. MRI of the cervical, thoracic, and lumbar spine demonstrated the following: at C5-6, moderate mid-to-left and moderate far right posterior disc bulges causing mild mass effect on the left ventral cord, severe left greater than right lateral recess stenosis, moderate to severe right and mild left neuroforaminal stenosis, and overall mild left central canal stenosis. At C3-4, mild left neuroforaminal stenosis was also noted. There were no signs of infection. By hospital day 2, his pain improved following dialysis. His weakness was attributed to electrolyte imbalances and physical deconditioning. He was discharged with a referral to outpatient physical therapy and advised to follow up with his primary care provider.

## 2023-07-03 NOTE — ED Provider Notes (Cosign Needed)
 While patient was in dialysis today, Dr. Christianne Cowper with nephrology contacted me, requesting patient to be admitted for his weakness. He did not report back pain while in the ED but told her he has it - she is concerned patient could possibly have discitis. Thoracic and lumbar MRIs ordered.  Physical Exam  BP (!) 147/83   Pulse 96   Temp 98.2 F (36.8 C)   Resp 13   Ht 6' 2 (1.88 m)   Wt (S) 121 kg Comment: pt did standing scale  SpO2 100%   BMI 34.25 kg/m   Physical Exam  Procedures  Procedures  ED Course / MDM    Medical Decision Making Amount and/or Complexity of Data Reviewed Radiology: ordered.  Risk Prescription drug management.   I consulted and spoke with Dr. Nooruddin with IM residents. He is agreeable to admit patient for further workup and evaluation of his weakness.    Sonnie Dusky, PA-C 07/03/23 1123

## 2023-07-03 NOTE — H&P (Addendum)
 Date: 07/03/2023               Patient Name:  Ryan Jenkins MRN: [id number]  DOB: January 13, 1961 Age / Sex: 63 y.o., male   PCP: [facility]              Medical Service: Internal Medicine Teaching Service              Attending Physician: Dr. Alwin Baars, Aretha Kubas, MD    First Contact: Darletta Ehrich, MS4    Second Contact: Dr. Fay Hoop    Third Contact Dr. Saad Nooruddin         After Hours (After 5p/  First Contact Pager: [phone]  weekends / holidays): Second Contact Pager: [phone]   Chief Complaint: chest pain and weakness  History of Present Illness:  [name] is a 63 year-old male with PMH of ESRD on TTS HD, HTN, T2DM c/b retinopathy and neuropathy, CAD, HLD, GERD, anemia, anxiety, and depression who presents for chest pain and weakness.  Patient started experiencing chest pain in the evening 2 days ago (6/16). This chest pain turned into chest tightness overnight, which was not as severe. When he woke up the following morning (6/17), he was dressing himself when he was suddenly unable to stand to pull up his pants. He tried several more times but ultimately called EMS for assistance. Since being in the hospital, patient has developed back pain in both the cervical and lumbar areas. The cervical pain radiates from his right ear to his right shoulder. The lumbar pain radiates to his right lower abdomen and down the right lower extremity. He intermittently experiences similar pain at home, but the pain is generally moderate in intensity (5/10) and only lasts 20-60 minutes. These episodes are also associated with similar weakness that resolves when the pain stops. He describes the current pain as severe (8/10), and it has lasted ~30 hours at this point in association with the weakness.  Patient otherwise reports experiencing chills, clamminess, and palpitations for the past several days. He developed headaches 3 days ago (6/15) that are located over his right eye, last  3-4 minutes each, and occur 3 times daily. His appetite has been poor for the past month. He had some shortness of breath with the chest pain that has since resolved. He denies nasal congestion, sore throat, cough, diarrhea, constipation, nausea, or vomiting.   Upon arrival to the ED, patient was afebrile (T 99.95F), hypertensive (BP 173/80), and saturating well on room air. Workup was notable for Hgb 11.4, WBC 5.3, Na 136, K 4.2, glucose 363, BUN 25, Cr 5.27, EKG NSR, troponin 46 -> 50, VBG 7.519/34.8/130/28.4, UA with >500 glucose, small Hgb, 11 protein, rare bacteria, and negative leukocytes/nitrites, CXR without acute cardiopulmonary disease, MRI brain without acute intracranial abnormality but mild chronic microvascular ischemic changes and right mastoid effusion. He received amlodipine  5 mg x1, fentanyl  50 mcg x1, lasix  40 mg x1, and hydralazine  100 mg x1.  Meds:  - amlodipine  5 mg daily - renal vitamin daily - calcium  acetate 1334 mg TID - famotidine  40 mg daily - furosemide  40 mg daily - hydralazine  100 mg BID - Lantus  15 units nightly - meclizine  25 mg nightly - metoprolol  12.5 mg daily - pioglitazone 15 mg daily - Miralax  17 g PRN - rosuvastatin  10 mg daily  Allergies: Allergies as of 07/02/2023 - Review Complete 07/02/2023  Allergen Reaction Noted   Latex Hives and Swelling 03/09/2020   Loratadine Swelling and Other (  See Comments) 12/15/2013   Hydrochlorothiazide  Other (See Comments) 02/25/2018   Lyrica [pregabalin] Other (See Comments) 03/21/2017   Metformin and related Diarrhea and Nausea And Vomiting 07/03/2018   Past Medical History:  Diagnosis Date   Anemia    Anginal pain (HCC)    Anxiety    Arthritis    BPH with obstruction/lower urinary tract symptoms    CAD (coronary artery disease)    cardiologist--- dr Adline Hook badal;  11-06-2019 cardiac cath in [place]  (result in care everywhere)  nonobstructive cad involing pRCA 60% (done in setting worseing CHF/ acute  pulmonary edema requiring intubation/ AKI   Chronic combined systolic and diastolic CHF (congestive heart failure) (HCC)    Chronic kidney disease, stage IV (severe) (HCC)    nephrologist--- dr Edson Graces   Depression    Diabetic neuropathy (HCC)    Dyspnea    Edema of both lower extremities    Gastric ulcer without hemorrhage or perforation 12/25/2018   GERD (gastroesophageal reflux disease)    Heart murmur    Hemodialysis patient (HCC)    History of community acquired pneumonia    admission 06-04-2021 in [facility]  w/ ARF hypoxia w/ severe sepsis   Hyperlipidemia    Hypertension    Insulin  dependent type 2 diabetes mellitus (HCC)    LBBB (left bundle branch block)    Pneumonia    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)    Uses walker    Vitamin B12 deficiency    Vitreous hemorrhage (HCC)    Family History: - Mother: ESRD - Father: unknown - Sister: CVA x2 - 3 other siblings: healthy - Daughter: healthy  Social History:  Patient lives in [place] with his brother. He spends most days going to medical appointments. He does not drive but uses public transportation. He is independent with iADLs/ADLs, though brother typically helps with cooking. He denies tobacco, alcohol, marijuana, heroin, cocaine, opioid, or benzodiazepine use. Emergency contact is Lydia Sams (brother) and Wenzel Backlund (sister). His daughter lives in [place], [place] , so she does not visit often.  Review of Systems: A complete ROS was negative except as per HPI.  Physical Exam: Blood pressure (!) 182/94, pulse 88, temperature 97.8 F (36.6 C), resp. rate (!) 22, height 6' 2 (1.88 m), weight (S) 121 kg, SpO2 100%. General: Chronically ill-appearing male lying in hospital bed while undergoing dialysis, appears tired but in NAD HEENT: Normocephalic and atraumatic. Conjunctivae clear. No nasal congestion or rhinorrhea. Oropharynx without lesions, erythema, or exudate. MMM. Poor dentition. Neck: Normal  range of motion. No palpable lymphadenopathy or masses.  Cardiovascular: Normal rate with regular rhythm. No murmurs, rubs, or gallops. AV fistula in RUE, did not assess for bruit or thrill since currently accessed for dialysis. No LE edema.  Pulmonary: Lungs CTAB. No wheezes, rales, or rhonchi. Normal respiratory effort on room air. Abdominal: Soft. Non-distended. Tenderness to palpation in periumbilical area. Normal bowel sounds.  Musculoskeletal: Right shoulder tender to palpation around entire joint. Sternum mildly tender to palpation but does not reproduce chest pain. Point tenderness to palpation around C7 and mid-lumbar back. No paraspinal tenderness.    Neurological: Alert, responds to questions slowly but appropriately. PERRL. EOMI. Face appears symmetric. Hearing intact. Palate rises symmetrically and tongue protrudes midline. Strength 5/5 in bilateral UE to flexion, extension, and hand grip. RUE strength 3/5 to abduction given pain. Strength 5/5 in LLE to flexion and extension. Strength 4/5 in RLE to flexion and extension. Sensation diminished below bilateral knees (diabetic neuropathy) but intact  elsewhere. Skin: Warm and dry.  EKG: personally reviewed my interpretation is NSR.  CXR: personally reviewed my interpretation is no acute abnormalities  Assessment & Plan by Problem: Principal Problem:   Weakness generalized Active Problems:   DM2 (diabetes mellitus, type 2) (HCC)   Essential hypertension   HLD (hyperlipidemia)   ESRD on dialysis (HCC)   Chronic bilateral low back pain with right-sided sciatica  Jordynn Perrier is a 63 year-old male with PMH of ESRD on TTS HD, HTN, T2DM c/b retinopathy and neuropathy, CAD, HLD, GERD, anemia, anxiety, and depression who presents for chest pain and weakness and admitted on 07/03/2023 for further workup.  #Weakness #Chronic bilateral lower back pain with R-sided sciatica Patient presents with 58-month history of progressive generalized  weakness that acutely worsened 1 day ago in the setting of localized back pain (8/10 intensity with ~30 hours duration) that feels different from his chronic back pain with sciatica (5/10 intensity with 20-60 minute duration). Physical exam notable for point tenderness around C7 and mid-lumbar region as well as reduced RLE strength (4/5) in comparison to LLE strength (5/5). Presentation is concerning for discitis or osteomyelitis given relatively high-risk for intermittent bacteremia from ESRD. Will obtain MRI cervical, thoracic, and lumbar spine to assess for infection vs structural (compression fracture, bulging disc, stenosis) abnormalities. Will also obtain blood cultures, ESR, and CRP for further infectious workup. Other sources of infection less likely since UA and CXR without signs of UTI or pneumonia. Other differentials include deconditioning, electrolyte disturbances, myopathy, and intracranial abnormalities. Deconditioning is possible since there is a chronic component to his weakness. Will consult PT/OT for evaluation. Electrolyte disturbances also possible given his ESRD but BMP reassuringly normal. Will add on magnesium  and phosphorous. Myopathy seems less likely she patient denies muscle pain and no tenderness on exam, though will order CK level to check. Intracranial abnormalities and CVA unlikely since MRI brain unremarkable. - MRI cervical, thoracic, and lumbar spine wo contrast - CRP, ESR, blood cultures - TSH, CK level, Mg, Phos - PT/OT consult - PRN Tylenol  650 mg Q6H PRN - Lidocaine  patch - Voltaren  1% gel - Repeat BMP and CBC tomorrow AM  #Chest pain #Nonobstructive CAD #HLD Patient reporting chest pain and chest tightness that started about 2 days ago. Workup in ED reassuring with EKG NSR and troponin relatively flat (46 -> 50). Patient also appears euvolemic on exam. However, patient was hospitalized in 10/2019 for respiratory distress due to possible heart failure, requiring  intubation for 1 week. Patient underwent cardiac catheterization during that hospitalization that showed non-obstructive CAD (proximal RCA lesion 60% stenosis). He has since been medically managed, currently on rosuvastatin  10 mg daily. Most recent echocardiogram in 12/2021 with EF 50-55%, low-normal LV function with global hypokinesis, moderate concentric LVH, mildly reduced RV function with mild RV enlargement, and mild left atrial dilation. Could consider echocardiogram to assess for weakness 2/2 to low cardiac output if workup above unrevealing. 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 He missed his dialysis session yesterday (Tuesday 6/17) since he was in the ED. He underwent dialysis today with -11000 mL. He will have dialysis again tomorrow (Thursday 6/19) while inpatient to resume regular schedule. Appreciate nephrology's assistance with inpatient management. - Nephrology consulted; appreciate recs - Home renal vitamin daily - Defer restarting home calcium  acetate 1334 mg TID to nephro  #  T2DM #Diabetic retinopathy #Diabetic neuropathy Glucose elevated between 286-363 in the ED. Patient does not consistently check his blood glucose at home, but he reports his blood glucose has been running high. He takes Lantus  15 units nightly at home. Will increase Lantus  to 20 units nightly and start sensitive SSI with meals for now. Will hold home pioglitazone while hospitalized. - Repeat HgbA1c - Increase Lantus  to 20 units nightly - Hold home pioglitazone 15 mg daily - SSI with meals - CBGs ACHS  #HTN Blood pressure has been very elevated between 129/73-207/93 in the ED. Will restart home amlodipine , hydralazine , and metoprolol  while hospitalized. Will hold home furosemide  since he seems euvolemic on exam. Will titrate medications as  needed for better blood pressure control. - Home amlodipine  5 mg daily - Home hydralazine  100 mg BID - Home metoprolol  12.5 mg daily - Hold home furosemide  40 mg daily  #GERD - Home famotidine  40 mg daily  Dispo: Admit patient to Observation with expected length of stay less than 2 midnights.  Signed: Elmira Haddock, Medical Student 07/03/2023, 1:22 PM

## 2023-07-04 DIAGNOSIS — R531 Weakness: Secondary | ICD-10-CM | POA: Diagnosis not present

## 2023-07-04 LAB — SURGICAL PCR SCREEN
MRSA, PCR: NEGATIVE
Staphylococcus aureus: NEGATIVE

## 2023-07-04 LAB — PHOSPHORUS: Phosphorus: 2.9 mg/dL (ref 2.5–4.6)

## 2023-07-04 LAB — CBC
HCT: 33.7 % — ABNORMAL LOW (ref 39.0–52.0)
Hemoglobin: 11.4 g/dL — ABNORMAL LOW (ref 13.0–17.0)
MCH: 30.8 pg (ref 26.0–34.0)
MCHC: 33.8 g/dL (ref 30.0–36.0)
MCV: 91.1 fL (ref 80.0–100.0)
Platelets: 295 10*3/uL (ref 150–400)
RBC: 3.7 MIL/uL — ABNORMAL LOW (ref 4.22–5.81)
RDW: 12.9 % (ref 11.5–15.5)
WBC: 6.4 10*3/uL (ref 4.0–10.5)
nRBC: 0 % (ref 0.0–0.2)

## 2023-07-04 LAB — BASIC METABOLIC PANEL WITH GFR
Anion gap: 8 (ref 5–15)
BUN: 17 mg/dL (ref 8–23)
CO2: 32 mmol/L (ref 22–32)
Calcium: 8.5 mg/dL — ABNORMAL LOW (ref 8.9–10.3)
Chloride: 95 mmol/L — ABNORMAL LOW (ref 98–111)
Creatinine, Ser: 4.34 mg/dL — ABNORMAL HIGH (ref 0.61–1.24)
GFR, Estimated: 15 mL/min — ABNORMAL LOW (ref >60)
Glucose, Bld: 101 mg/dL — ABNORMAL HIGH (ref 70–99)
Potassium: 3.1 mmol/L — ABNORMAL LOW (ref 3.5–5.1)
Sodium: 135 mmol/L (ref 135–145)

## 2023-07-04 LAB — GLUCOSE, CAPILLARY
Glucose-Capillary: 190 mg/dL — ABNORMAL HIGH (ref 70–99)
Glucose-Capillary: 188 mg/dL — ABNORMAL HIGH (ref 70–99)
Glucose-Capillary: 165 mg/dL — ABNORMAL HIGH (ref 70–99)

## 2023-07-04 LAB — CK: Total CK: 177 U/L (ref 49–397)

## 2023-07-04 LAB — TSH: TSH: 3.713 u[IU]/mL (ref 0.350–4.500)

## 2023-07-04 LAB — MAGNESIUM: Magnesium: 1.9 mg/dL (ref 1.7–2.4)

## 2023-07-04 MED ORDER — POTASSIUM CHLORIDE CRYS ER 20 MEQ PO TBCR
40.0000 meq | EXTENDED_RELEASE_TABLET | Freq: Two times a day (BID) | ORAL | Status: DC
  Administered 2023-07-04: 40 meq via ORAL
  Filled 2023-07-04: qty 2

## 2023-07-04 NOTE — TOC Transition Note (Signed)
 Transition of Care [facility]) - Discharge Note   Patient Details  Name: Ryan Jenkins MRN: [id number] Date of Birth: [birth year]  Transition of [facility]) CM/SW Contact:  Tom-Johnson, Tarvares Lant Daphne, RN Phone Number: 07/04/2023, 1:00 PM   Clinical Narrative:     Patient is scheduled for discharge today.  Home health info, hospital f/u and discharge instructions on AVS. Family to transport at discharge.  No further TOC needs noted.       Final next level of care: Home w Home Health Services Barriers to Discharge: Barriers Resolved   Patient Goals and CMS Choice Patient states their goals for this hospitalization and ongoing recovery are:: To return home CMS Medicare.gov Compare Post Acute Care list provided to:: Patient   [place] ownership interest in [facility].provided to:: Patient    Discharge Placement                Patient to be transferred to facility by: Family      Discharge Plan and Services Additional resources added to the After Visit Summary for                  DME Arranged: N/A DME Agency: NA       HH Arranged: PT, RN, Disease Management HH Agency: [facility] Date [facility] Agency Contacted: 07/04/23 Time HH Agency Contacted: 1250 Representative spoke with at [facility] Agency: Loetta Ringer  Social Drivers of Health (SDOH) Interventions SDOH Screenings   Food Insecurity: No Food Insecurity (07/03/2023)  Housing: Low Risk  (07/03/2023)  Transportation Needs: No Transportation Needs (07/03/2023)  Utilities: Not At Risk (07/03/2023)  Depression (PHQ2-9): Low Risk  (12/26/2022)  Financial Resource Strain: Low Risk  (01/18/2022)   Received from [facility]  Physical Activity: Inactive (11/23/2020)   Received from [facility]  Social Connections: Unknown (05/16/2021)   Received from [facility]  Stress: Stress Concern Present (11/23/2020)   Received from [facility]  Tobacco Use: Low Risk  (07/02/2023)     Readmission Risk  Interventions    05/18/2022    9:11 AM 04/05/2022   11:10 AM 03/26/2022    5:46 PM  Readmission Risk Prevention Plan  Transportation Screening Complete Complete Complete  Medication Review (RN Care Manager) Referral to Pharmacy Complete Referral to Pharmacy  PCP or Specialist appointment within 3-5 days of discharge Complete Complete   HRI or Home Care Consult Complete Complete Complete  SW Recovery Care/Counseling Consult Complete Complete Complete  Palliative Care Screening Not Applicable Not Applicable Not Applicable  Skilled Nursing Facility Not Applicable Not Applicable Not Applicable

## 2023-07-04 NOTE — Evaluation (Signed)
 Physical Therapy Evaluation Patient Details Name: Ryan Jenkins MRN: [id number] DOB: May 23, 1960 Today's Date: 07/04/2023  History of Present Illness  63 y.o. male presents to [facility] hospital on 07/02/23 with chest pain that radiates into LUE and difficulty standing. Chest pain then subsided and pt began to have LBP and neck pain. PMH includes: diabetes with neuropathy and gastroparesis, HTN, CKD 4, ESRD, HD TThS, depression, anxiety, HLD, GERD, CHF, CAD, anemia  Clinical Impression  Pt admitted with above diagnosis. Pt from home with his brother who works, pt gets transportation to HD. Uses w/c and rollator for mobility. Pt has had similar back issues in the past but not in recent months. Pt experienced R sided LBP with ambulation >60' but remained tolerable. Encouraged pt to use his 2 wheel RW when back is flared to promote more upright posture. Education given on back precautions, posture, and activity modifications to maintain a healthy back. Pt has been receiving HHPT and rec continuing this to address weakness and decreased mobility.  Pt currently with functional limitations due to the deficits listed below (see PT Problem List). Pt will benefit from acute skilled PT to increase their independence and safety with mobility to allow discharge.           If plan is discharge home, recommend the following: A little help with walking and/or transfers;A little help with bathing/dressing/bathroom;Assist for transportation;Assistance with cooking/housework   Can travel by private vehicle        Equipment Recommendations None recommended by PT  Recommendations for Other Services       Functional Status Assessment Patient has had a recent decline in their functional status and demonstrates the ability to make significant improvements in function in a reasonable and predictable amount of time.     Precautions / Restrictions Precautions Precautions: Fall Recall of Precautions/Restrictions:  Intact Restrictions Weight Bearing Restrictions Per Provider Order: No      Mobility  Bed Mobility Overal bed mobility: Needs Assistance Bed Mobility: Supine to Sit     Supine to sit: Contact guard, Used rails     General bed mobility comments: able to come to EOB without physical assist    Transfers Overall transfer level: Needs assistance Equipment used: Rolling walker (2 wheels) Transfers: Sit to/from Stand Sit to Stand: Contact guard assist, Min assist           General transfer comment: min A to stand the first time but stood without physical assist the second time.    Ambulation/Gait Ambulation/Gait assistance: Contact guard assist Gait Distance (Feet): 125 Feet Assistive device: Rolling walker (2 wheels) Gait Pattern/deviations: Step-through pattern       General Gait Details: guarded gait, obvious proprioceptive deficits B feet,  no LOB with use of RW. Encouraged use of RW instead of rollator when back is hurting for more erect posture. Discomfort R sided low back began after 30' but remained tolerable  Stairs            Wheelchair Mobility     Tilt Bed    Modified Rankin (Stroke Patients Only)       Balance Overall balance assessment: Needs assistance Sitting-balance support: Feet supported Sitting balance-Leahy Scale: Good     Standing balance support: Reliant on assistive device for balance Standing balance-Leahy Scale: Poor                               Pertinent Vitals/Pain Pain Assessment Pain  Assessment: Faces Faces Pain Scale: Hurts little more Pain Location: R sided low back after 60' ambulation Pain Descriptors / Indicators: Throbbing, Spasm Pain Intervention(s): Limited activity within patient's tolerance, Monitored during session    Home Living Family/patient expects to be discharged to:: Private residence Living Arrangements: Other relatives Available Help at Discharge: Family;Available  PRN/intermittently Type of Home: Apartment Home Access: Level entry       Home Layout: One level Home Equipment: Agricultural consultant (2 wheels);Rollator (4 wheels);Wheelchair - Lawyer Comments: lives with brother who works. Gets transportation to HD. Brother and sister also help him out.    Prior Function Prior Level of Function : Independent/Modified Independent             Mobility Comments: Uses w/c in the home and 4WRW in the community for short distances. ADLs Comments: Mod I for ADL and iADL from w/c level.     Extremity/Trunk Assessment   Upper Extremity Assessment Upper Extremity Assessment: Defer to OT evaluation    Lower Extremity Assessment Lower Extremity Assessment: Generalized weakness (R stronger than L)    Cervical / Trunk Assessment Cervical / Trunk Assessment: Normal  Communication   Communication Communication: No apparent difficulties    Cognition Arousal: Alert Behavior During Therapy: WFL for tasks assessed/performed   PT - Cognitive impairments: No apparent impairments                         Following commands: Intact       Cueing Cueing Techniques: Verbal cues     General Comments General comments (skin integrity, edema, etc.): discussed back precautions and proper posture for maintenance of healthy back    Exercises     Assessment/Plan    PT Assessment Patient needs continued PT services  PT Problem List Decreased strength;Decreased activity tolerance;Decreased mobility;Decreased balance;Decreased knowledge of precautions;Pain;Impaired sensation       PT Treatment Interventions DME instruction;Gait training;Functional mobility training;Therapeutic activities;Therapeutic exercise;Balance training;Patient/family education;Neuromuscular re-education    PT Goals (Current goals can be found in the Care Plan section)  Acute Rehab PT Goals Patient Stated Goal: return home PT Goal Formulation: With  patient Time For Goal Achievement: 07/18/23 Potential to Achieve Goals: Good    Frequency Min 2X/week     Co-evaluation               AM-PAC PT 6 Clicks Mobility  Outcome Measure Help needed turning from your back to your side while in a flat bed without using bedrails?: None Help needed moving from lying on your back to sitting on the side of a flat bed without using bedrails?: None Help needed moving to and from a bed to a chair (including a wheelchair)?: A Little Help needed standing up from a chair using your arms (e.g., wheelchair or bedside chair)?: A Little Help needed to walk in hospital room?: A Little Help needed climbing 3-5 steps with a railing? : A Lot 6 Click Score: 19    End of Session Equipment Utilized During Treatment: Gait belt Activity Tolerance: Patient tolerated treatment well Patient left: in chair;with call bell/phone within reach;with family/visitor present Nurse Communication: Mobility status PT Visit Diagnosis: Pain;Difficulty in walking, not elsewhere classified (R26.2) Pain - part of body:  (back)    Time: 4098-1191 PT Time Calculation (min) (ACUTE ONLY): 40 min   Charges:   PT Evaluation $PT Eval Moderate Complexity: 1 Mod PT Treatments $Gait Training: 8-22 mins $Therapeutic Activity: 8-22 mins PT General  Charges $$ ACUTE PT VISIT: 1 Visit         Amey Ka, PT  Acute Rehab Services Secure chat preferred Office [phone]   Sharman Debar 07/04/2023, 12:23 PM

## 2023-07-04 NOTE — Progress Notes (Cosign Needed Addendum)
 [facility] Progress Note   Subjective: Up in chair talking with PT. No C/Os. Did walk with PT this am. Contact iso for MRSA. HD today to resume T,TH,S schedule b Objective Vitals:   07/03/23 1414 07/03/23 1940 07/04/23 0555 07/04/23 0719  BP: (!) 177/88 133/73 (!) 158/72 (!) 147/75  Pulse: 98 73 75 86  Resp: 16 18  19   Temp: 97.9 F (36.6 C) 98.2 F (36.8 C) 97.9 F (36.6 C) 98.4 F (36.9 C)  TempSrc:  Oral Oral   SpO2: 100% 100% 100% 100%  Weight: 93.4 kg     Height:        Additional Objective Labs: Basic Metabolic Panel: Recent Labs  Lab 07/02/23 1044 07/02/23 1440 07/04/23 0531  NA 136 136 135  K 4.2 3.0* 3.1*  CL 97*  --  95*  CO2 27  --  32  GLUCOSE 363*  --  101*  BUN 25*  --  17  CREATININE 5.27*  --  4.34*  CALCIUM  9.1  --  8.5*  PHOS  --   --  2.9   Liver Function Tests: No results for input(s): AST, ALT, ALKPHOS, BILITOT, PROT, ALBUMIN  in the last 168 hours. No results for input(s): LIPASE, AMYLASE in the last 168 hours. CBC: Recent Labs  Lab 07/02/23 1044 07/02/23 1440 07/04/23 0531  WBC 5.3  --  6.4  HGB 11.4* 11.2* 11.4*  HCT 33.5* 33.0* 33.7*  MCV 92.3  --  91.1  PLT 290  --  295   Blood Culture    Component Value Date/Time   SDES BLOOD BLOOD LEFT WRIST 05/27/2022 0635   SPECREQUEST  05/27/2022 1610    BOTTLES DRAWN AEROBIC AND ANAEROBIC Blood Culture results may not be optimal due to an excessive volume of blood received in culture bottles   CULT  05/27/2022 0635    NO GROWTH 5 DAYS Performed at [facility], [address] [place], [place] [address]    REPTSTATUS 06/01/2022 FINAL 05/27/2022 0635    Cardiac Enzymes: Recent Labs  Lab 07/04/23 0531  CKTOTAL 177   CBG: Recent Labs  Lab 07/03/23 1527 07/03/23 1627 07/03/23 2259 07/04/23 0723  GLUCAP 229* 265* 101* 190*   Iron Studies: No results for input(s): IRON, TIBC, TRANSFERRIN, FERRITIN in the last 72  hours. @lablastinr3 @ Studies/Results: MR LUMBAR SPINE WO CONTRAST Result Date: 07/03/2023 CLINICAL DATA:  Mid back pain. Infection suspected. Since being in the hospital, patient has developed back pain in both the cervical and lumbar areas. Lumbar pain radiates to right lower abdomen and down the right leg. EXAM: MRI LUMBAR SPINE WITHOUT CONTRAST TECHNIQUE: Multiplanar, multisequence MR imaging of the lumbar spine was performed. No intravenous contrast was administered. COMPARISON:  Lumbar spine radiographs 07/07/2020, MRI lumbar spine 03/26/2022 FINDINGS: Segmentation: There are 5 non-rib-bearing lumbar-type vertebral bodies. Alignment:  No sagittal spondylolisthesis. Vertebrae: Vertebral body heights are maintained. Disc spaces are preserved. Decreased disc hydration at L5-S1 greater than L4-5. Normal marrow signal. No endplate erosions to indicate discitis/osteomyelitis. Conus medullaris and cauda equina: Conus extends to the inferior T12 level. Conus and cauda equina appear normal. Paraspinal and other soft tissues: Limited images of the retroperitoneum are unremarkable. Disc levels: T12-L1: Unremarkable. L1-2: No posterior disc bulge, central canal narrowing, or neuroforaminal stenosis. L2-3: No posterior disc bulge, central canal narrowing, or neuroforaminal stenosis. L3-4: Small bilateral intraforaminal annular disc tears and minimal bilateral intraforaminal disc protrusions, not simply changed from prior. Very mild right greater than left  neuroforaminal stenosis. No central canal stenosis. L4-5: Mild bilateral facet joint hypertrophy. Right-greater-than-left intraforaminal annular disc tears. Mild right and left posterior disc bulge with moderate right greater than left intraforaminal disc protrusions. Moderate right greater than left neuroforaminal narrowing, slightly worsened from prior. Moderate right and mild left lateral recess stenosis is mildly worsened from prior. Borderline mild central canal  stenosis is new. L5-S1: Mild bilateral facet joint hypertrophy. Mild left facet joint effusion is similar to prior. Left and trace right intraforaminal annular disc tears. Moderate left and mild right intraforaminal disc protrusions. Mild left neuroforaminal stenosis and borderline mild right neuroforaminal stenosis, similar to prior. No central canal stenosis. Disc mildly contacts the bilateral descending L5 nerves, left greater than right, unchanged from prior. IMPRESSION: 1. No evidence of discitis/osteomyelitis. 2. Multilevel degenerative disc and joint changes as above. 3. At L4-5, moderate right greater than left neuroforaminal stenosis is slightly worsened from prior. Moderate right and mild left lateral recess stenosis is mildly worsened from prior. Borderline mild central canal stenosis is new. 4. At L5-S1, mild left neuroforaminal stenosis and borderline mild right neuroforaminal stenosis, similar to prior. Disc mildly contacts the bilateral descending L5 nerves, left greater than right, unchanged. Electronically Signed   By: Bertina Broccoli M.D.   On: 07/03/2023 20:43   MR THORACIC SPINE WO CONTRAST Result Date: 07/03/2023 CLINICAL DATA:  Mid back pain. Infection suspected. Since being in the hospital, patient has developed back pain in both the cervical and lumbar areas. The lumbar pain radiates to the right lower abdomen and down the right lower extremity. EXAM: MRI THORACIC SPINE WITHOUT CONTRAST TECHNIQUE: Multiplanar, multisequence MR imaging of the thoracic spine was performed. No intravenous contrast was administered. COMPARISON:  AP chest 07/02/2023, chest two views 11/28/2022, CTA chest 05/27/2022 FINDINGS: Alignment:  No sagittal spondylolisthesis. Vertebrae: Vertebral body heights are maintained. Small decreased T1 and decreased T2 signal sclerotic focus within the anterior superior T7 vertebral body is unchanged in size from sclerotic lesion on prior 05/27/2022 CT and likely represents a  benign bone island. On prior CT there is a region of focal lucency within the midline posteroinferior T8 vertebral body that is unchanged in size on the current study, demonstrating central decreased T1 and decreased T2 and peripheral increased T1, T2, and STIR signal. This may represent a benign hemangioma versus benign vascular lesion. Mild to moderate anterior T5-6 and mild anterior T4-5 and T6-7 disc space narrowing. Decreased disc hydration is seen throughout the T2-3 through T8-9 levels, greatest at T5-6. Cord: The thoracic cord demonstrates normal signal and caliber. The conus terminates at the superior L1 level. Paraspinal and other soft tissues: No paravertebral abscess. No significant abnormality is seen within the paraspinal soft tissues. Disc levels: T5-6: Mild posterior disc bulge and mild extension into the inferior aspect of the left neural foramen. Mild left neuroforaminal narrowing. Mild left greater than right lateral recess narrowing. No central canal stenosis. T8-9: Minimal right greater than left posterior disc bulge. Mild right lateral recess narrowing. No central canal or neuroforaminal stenosis. IMPRESSION: 1. T5-6 mild posterior disc bulge extending into the inferior aspect of the left neural foramen. Mild left T5-6 neuroforaminal narrowing. 2. Mild right T8-9 lateral recess narrowing. 3. No central canal stenosis within the thoracic spine. 4. No MRI evidence of discitis/osteomyelitis or paravertebral abscess. Electronically Signed   By: Bertina Broccoli M.D.   On: 07/03/2023 20:32   MR CERVICAL SPINE WO CONTRAST Result Date: 07/03/2023 CLINICAL DATA:  Chronic neck pain. Cervical and  lumbar back pain. Cervical pain radiates from right ear to right shoulder. EXAM: MRI CERVICAL SPINE WITHOUT CONTRAST TECHNIQUE: Multiplanar, multisequence MR imaging of the cervical spine was performed. No intravenous contrast was administered. COMPARISON:  None Available. FINDINGS: Alignment: There is reversal  of the normal cervical lordosis with mild kyphotic angulation centered at C4-5. The atlantodens interval is intact. Vertebrae: Vertebral body heights are maintained. Mild superior C4 endplate shallow degenerative Schmorl's nodes. Mild anterior C4-5 through C6-7 endplate osteophytes. Minimal anterior C5-6 edematous marrow endplate degenerative changes. No endplate erosion. No acute fracture or destructive bone lesion. Cord: The cervical cord demonstrates normal signal and caliber. Posterior Fossa, vertebral arteries, paraspinal tissues: Negative. Disc levels: C2-3: Moderate left and mild right facet joint hypertrophy. No posterior disc bulge. No central canal or neuroforaminal stenosis. C3-4: Moderate bilateral facet joint hypertrophy. Mild broad-based posterior disc bulge. Mild left intraforaminal disc and endplate spurring. Mild left neuroforaminal stenosis. No central canal stenosis. C4-5: Mild bilateral facet joint hypertrophy. Minimal posterior disc bulge. No central canal or neuroforaminal stenosis. C5-6: Mild-to-moderate bilateral facet joint hypertrophy. Moderate mid to left and moderate far right posterior disc bulges (axial series 4, image 26). Mild mass effect on the left ventral cord. Severe left greater than right lateral recess stenosis. Moderate to severe right and mild left neuroforaminal stenosis. Overall mild left central canal stenosis. C6-7: Mild bilateral facet joint hypertrophy. Mild left greater than right posterior disc bulge. No central canal or neuroforaminal stenosis. C7-T1: No posterior disc bulge, central canal narrowing, or neuroforaminal stenosis. please see report from contemporaneous MRI thoracic spine for evaluation of the thoracic disc levels. IMPRESSION: 1. At C5-6, moderate mid to left and moderate far right posterior disc bulges with mild mass effect on the left ventral cord. Severe left greater than right lateral recess stenosis. Moderate to severe right and mild left  neuroforaminal stenosis. Overall mild left central canal stenosis. 2. At C3-4, mild left neuroforaminal stenosis. Electronically Signed   By: Bertina Broccoli M.D.   On: 07/03/2023 20:17   MR BRAIN WO CONTRAST Result Date: 07/02/2023 CLINICAL DATA:  Neuro deficit, concern for stroke. EXAM: MRI HEAD WITHOUT CONTRAST TECHNIQUE: Multiplanar, multiecho pulse sequences of the brain and surrounding structures were obtained without intravenous contrast. COMPARISON:  Same day CT head. FINDINGS: Brain: No acute infarct. No evidence of intracranial hemorrhage. T2/FLAIR hyperintensity in the periventricular white matter. No edema, mass effect, or midline shift. Posterior fossa is unremarkable. Normal appearance of midline structures. The basilar cisterns are patent. No extra-axial fluid collections. Ventricles: Normal size and configuration of the ventricles. Vascular: Skull base flow voids are visualized. Skull and upper cervical spine: No focal abnormality. Sinuses/Orbits: Bilateral lens replacement. No significant mucosal thickening in the paranasal sinuses. Other: Right mastoid effusion. IMPRESSION: No acute intracranial abnormality. Mild chronic microvascular ischemic changes. Right mastoid effusion. Electronically Signed   By: Denny Flack M.D.   On: 07/02/2023 18:08   CT Head Wo Contrast Result Date: 07/02/2023 CLINICAL DATA:  Neuro deficit, acute, stroke suspected EXAM: CT HEAD WITHOUT CONTRAST TECHNIQUE: Contiguous axial images were obtained from the base of the skull through the vertex without intravenous contrast. RADIATION DOSE REDUCTION: This exam was performed according to the departmental dose-optimization program which includes automated exposure control, adjustment of the mA and/or kV according to patient size and/or use of iterative reconstruction technique. COMPARISON:  04/04/2022 FINDINGS: Brain: No acute intracranial abnormality. Specifically, no hemorrhage, hydrocephalus, mass lesion, acute  infarction, or significant intracranial injury. Mild cerebral atrophy. Vascular: No  hyperdense vessel or unexpected calcification. Skull: No acute calvarial abnormality. Sinuses/Orbits: No acute findings Other: None IMPRESSION: No acute intracranial abnormality. Electronically Signed   By: Janeece Mechanic M.D.   On: 07/02/2023 15:38   DG Chest 1 View Result Date: 07/02/2023 CLINICAL DATA:  Chest pain EXAM: CHEST  1 VIEW portable COMPARISON:  X-ray 03/28/2023 FINDINGS: No consolidation, pneumothorax or effusion. Normal cardiopericardial silhouette without edema. Artifact from overlapping gown snaps. Films are under penetrated. IMPRESSION: No acute cardiopulmonary disease. Electronically Signed   By: Adrianna Horde M.D.   On: 07/02/2023 12:55   Medications:   acetaminophen   650 mg Oral Q6H   amLODipine   5 mg Oral Daily   Chlorhexidine  Gluconate Cloth  6 each Topical Q0600   heparin   5,000 Units Subcutaneous Q8H   insulin  aspart  0-5 Units Subcutaneous QHS   insulin  aspart  0-6 Units Subcutaneous TID WC   insulin  glargine-yfgn  20 Units Subcutaneous Daily   lidocaine   1 patch Transdermal Q24H   metoprolol  succinate  12.5 mg Oral Daily   multivitamin  1 tablet Oral QHS   rosuvastatin   10 mg Oral QHS    Physical Exam General: Pleasant male in NAD Heart:S1,S2 RRR no M/R/G Lungs: CTAB A/P Abdomen: NABS NT Extremities:No LE edema  Dialysis Access: R AVF + T/B  Dialysis Orders: [facility] T,Th,S 4 hr 180NRe 400/800 91.1 kg 2.0 K/2.5 V UFP 2 AVF - Heparin  4500 units IV ITW - Hectorol 3 mcg IV three times per week   Assessment/Plan: 1 Hypokalemia: K+ 3.1 this AM. KCL 40 meq PO X 2 doses. Use 4.0 K bath with HD. Follow labs.  2.  Back pain/ mobility issues: this is most concerning to me- this is a pt who is very independent as he can be at baseline- MRI showing foraminal stenosis in March but worsening back pain with increased inability to move in a dialysis pt makes me concerned for possible discitis/  hematogenous spread of bacteria.  Additionally safety risk considering decreased mobility- have d/w EDP- will admit, get MRI T and L spine.  Will also need PT/OT eval too. Spinal stenosis noted on MRI.  No MRI evidence of discitis/osteomyelitis or paravertebral abscess. 3 ESRD: TTS normally-HD today off schedule and then back to normal Schedule Thursday 4 Hypertension: reasonably well-controlled. Not consistently meeting EDW as OP. He C/O weakness. Raise EDW on discharge 0.5 kg. UF as tolerated.  5 Anemia of ESRD: Hgb OK for now 6 Metabolic Bone Disease: PO4/Ca+ at goal. Continue BMD meds.   Addendum: Notified by C. Doree Games, RN that patient has discharge order but has not received HD yet. Apparently patient will not go until late in evening after 10 PM D/T staffing issues. Called Dr. Landa Pine call MD for [facility], notified of all parameters. OK for patient to be discharged. Notified C. Building surveyor.  Appreciate RN advocating for this patient to ensure he receives proper care prior to DC.   Gearline Spilman H. Maximo Spratling NP-C 07/04/2023, 10:57 AM  [employer] [phone]

## 2023-07-04 NOTE — Discharge Summary (Signed)
 Name: Ryan Jenkins MRN: [id number] DOB: 12/13/1960 63 y.o. PCP: [facility] [address]  Date of Admission: 07/02/2023 10:37 AM Date of Discharge: 07/04/2023 3:03 PM Attending Physician: Dr. Alwin Baars  Discharge Diagnosis: Principal Problem:   Weakness generalized Active Problems:   DM2 (diabetes mellitus, type 2) (HCC)   Essential hypertension   HLD (hyperlipidemia)   ESRD on dialysis (HCC)   Chronic bilateral low back pain with right-sided sciatica    Discharge Medications: Allergies as of 07/04/2023       Reactions   Claritin [loratadine] Swelling, Other (See Comments)   Joint swelling   Latex Hives, Swelling   catheter   Hydrochlorothiazide  Other (See Comments)   Dizziness   Lyrica [pregabalin] Other (See Comments)   Depression Makes me loopy    Metformin And Related Diarrhea, Nausea And Vomiting        Medication List     TAKE these medications    acetaminophen  650 MG CR tablet Commonly known as: TYLENOL  Take 650 mg by mouth every 8 (eight) hours as needed for pain.   amLODipine  5 MG tablet Commonly known as: NORVASC  Take 5 mg by mouth See admin instructions. Take 1 tablet (5mg ) by mouth once daily - on Tuesday, Thursday and Saturday take 1 tablet in the afternoon, after dialysis. On non-dialysis days, take 1 tablet every morning.   Belsomra 5 MG Tabs Generic drug: Suvorexant Take 1 tablet by mouth at bedtime.   calcium  acetate 667 MG capsule Commonly known as: PHOSLO Take 1,334 mg by mouth 3 (three) times daily with meals.   famotidine  40 MG tablet Commonly known as: PEPCID  Take 40 mg by mouth daily in the afternoon.   furosemide  40 MG tablet Commonly known as: LASIX  Take 40 mg by mouth See admin instructions. Take 1 tablet (40mg ) by mouth daily. On Tuesday, Thursday and Saturday take 1 tablet in the afternoon, after dialysis. On non-dialysis days, take 1 tablet every morning.   hydrALAZINE  100 MG tablet Commonly known as: APRESOLINE  Take 100  mg by mouth 3 (three) times daily.   Lantus  SoloStar 100 UNIT/ML Solostar Pen Generic drug: insulin  glargine Inject 5 Units into the skin at bedtime. What changed: how much to take   meclizine  25 MG tablet Commonly known as: ANTIVERT  Take 25 mg by mouth at bedtime.   metoprolol  succinate 25 MG 24 hr tablet Commonly known as: TOPROL -XL Take 0.5 tablets (12.5 mg total) by mouth daily. What changed:  when to take this additional instructions   multivitamin Tabs tablet Take 1 tablet by mouth daily in the afternoon.   pioglitazone 15 MG tablet Commonly known as: ACTOS Take 15 mg by mouth daily.   polyethylene glycol 17 g packet Commonly known as: MIRALAX  / GLYCOLAX  Take 17 g by mouth daily as needed (constipation).   rosuvastatin  10 MG tablet Commonly known as: CRESTOR  Take 10 mg by mouth at bedtime.   sevelamer  carbonate 800 MG tablet Commonly known as: RENVELA  Take 1,600 mg by mouth 3 (three) times daily with meals.   tamsulosin  0.4 MG Caps capsule Commonly known as: FLOMAX  Take 0.8 mg by mouth daily.        Disposition and follow-up:   [name] was discharged from [facility] in Good condition.  At the hospital follow up visit please address:  1.  Follow-up: Please ensure adherence to his dialysis sessions. Kindly follow up on his progress and how he is responding to physical therapy. If his pain does  not improve with ongoing physical therapy, please evaluate whether medication is needed and initiate treatment as appropriate.   4.  Medication Changes     None    Follow-up Appointments:  Follow-up Information     [facility] Follow up.   Specialty: Home Health Services Why: Someone will call you to schedule resumption of care visit. Contact information: [address] [place] [address] [phone]         [facility], [address] Follow up.   Contact information: [address] [place]  [address] [phone]                 Hospital Course by problem list:  A 63 year old male with a history of type 2 diabetes mellitus (on insulin ) complicated by retinopathy and end-stage renal disease (since 2025) presented to the emergency department after experiencing chest pain and weakness severe enough to prevent him from completing dressing. He was recently evaluated during hemodialysis for concerns of discitis. On examination, he had mild tenderness over the lower back but showed no signs of systemic infection. He reported vague, radiating pain starting from his right ear down his back to the lower back. Troponin levels were unremarkable, and a CT head scan revealed no acute intracranial abnormalities. MRI of the cervical, thoracic, and lumbar spine demonstrated the following: at C5-6, moderate mid-to-left and moderate far right posterior disc bulges causing mild mass effect on the left ventral cord, severe left greater than right lateral recess stenosis, moderate to severe right and mild left neuroforaminal stenosis, and overall mild left central canal stenosis. At C3-4, mild left neuroforaminal stenosis was also noted. There were no signs of infection. By hospital day 2, his pain improved following dialysis. His weakness was attributed to electrolyte imbalances and physical deconditioning. He was discharged with a referral to outpatient physical therapy and advised to follow up with his primary care provider.   Discharge Subjective:   Discharge Exam:   Blood pressure (!) 147/75, pulse 86, temperature 98.4 F (36.9 C), resp. rate 19, height 6' 2 (1.88 m), weight 93.4 kg, SpO2 100%.  Constitutional:well-appearing man, sitting in bed, in no acute distress HENT: normocephalic atraumatic, mucous membranes moist Cardiovascular: regular rate and rhythm Pulmonary/Chest: normal work of breathing on room air MSK: no gross abnormalities. pitting edema Skin: warm and dry Psych: Normal mood  and affect  Pertinent Labs, Studies, and Procedures:     Latest Ref Rng & Units 07/04/2023    5:31 AM 07/02/2023    2:40 PM 07/02/2023   10:44 AM  CBC  WBC 4.0 - 10.5 K/uL 6.4   5.3   Hemoglobin 13.0 - 17.0 g/dL 57.8  46.9  62.9   Hematocrit 39.0 - 52.0 % 33.7  33.0  33.5   Platelets 150 - 400 K/uL 295   290        Latest Ref Rng & Units 07/04/2023    5:31 AM 07/02/2023    2:40 PM 07/02/2023   10:44 AM  CMP  Glucose 70 - 99 mg/dL 528   413   BUN 8 - 23 mg/dL 17   25   Creatinine 2.44 - 1.24 mg/dL 0.10   2.72   Sodium 536 - 145 mmol/L 135  136  136   Potassium 3.5 - 5.1 mmol/L 3.1  3.0  4.2   Chloride 98 - 111 mmol/L 95   97   CO2 22 - 32 mmol/L 32   27   Calcium  8.9 -  10.3 mg/dL 8.5   9.1     MR LUMBAR SPINE WO CONTRAST Result Date: 07/03/2023 CLINICAL DATA:  Mid back pain. Infection suspected. Since being in the hospital, patient has developed back pain in both the cervical and lumbar areas. Lumbar pain radiates to right lower abdomen and down the right leg. EXAM: MRI LUMBAR SPINE WITHOUT CONTRAST TECHNIQUE: Multiplanar, multisequence MR imaging of the lumbar spine was performed. No intravenous contrast was administered. COMPARISON:  Lumbar spine radiographs 07/07/2020, MRI lumbar spine 03/26/2022 FINDINGS: Segmentation: There are 5 non-rib-bearing lumbar-type vertebral bodies. Alignment:  No sagittal spondylolisthesis. Vertebrae: Vertebral body heights are maintained. Disc spaces are preserved. Decreased disc hydration at L5-S1 greater than L4-5. Normal marrow signal. No endplate erosions to indicate discitis/osteomyelitis. Conus medullaris and cauda equina: Conus extends to the inferior T12 level. Conus and cauda equina appear normal. Paraspinal and other soft tissues: Limited images of the retroperitoneum are unremarkable. Disc levels: T12-L1: Unremarkable. L1-2: No posterior disc bulge, central canal narrowing, or neuroforaminal stenosis. L2-3: No posterior disc bulge, central canal  narrowing, or neuroforaminal stenosis. L3-4: Small bilateral intraforaminal annular disc tears and minimal bilateral intraforaminal disc protrusions, not simply changed from prior. Very mild right greater than left neuroforaminal stenosis. No central canal stenosis. L4-5: Mild bilateral facet joint hypertrophy. Right-greater-than-left intraforaminal annular disc tears. Mild right and left posterior disc bulge with moderate right greater than left intraforaminal disc protrusions. Moderate right greater than left neuroforaminal narrowing, slightly worsened from prior. Moderate right and mild left lateral recess stenosis is mildly worsened from prior. Borderline mild central canal stenosis is new. L5-S1: Mild bilateral facet joint hypertrophy. Mild left facet joint effusion is similar to prior. Left and trace right intraforaminal annular disc tears. Moderate left and mild right intraforaminal disc protrusions. Mild left neuroforaminal stenosis and borderline mild right neuroforaminal stenosis, similar to prior. No central canal stenosis. Disc mildly contacts the bilateral descending L5 nerves, left greater than right, unchanged from prior. IMPRESSION: 1. No evidence of discitis/osteomyelitis. 2. Multilevel degenerative disc and joint changes as above. 3. At L4-5, moderate right greater than left neuroforaminal stenosis is slightly worsened from prior. Moderate right and mild left lateral recess stenosis is mildly worsened from prior. Borderline mild central canal stenosis is new. 4. At L5-S1, mild left neuroforaminal stenosis and borderline mild right neuroforaminal stenosis, similar to prior. Disc mildly contacts the bilateral descending L5 nerves, left greater than right, unchanged. Electronically Signed   By: Bertina Broccoli M.D.   On: 07/03/2023 20:43   MR THORACIC SPINE WO CONTRAST Result Date: 07/03/2023 CLINICAL DATA:  Mid back pain. Infection suspected. Since being in the hospital, patient has developed back  pain in both the cervical and lumbar areas. The lumbar pain radiates to the right lower abdomen and down the right lower extremity. EXAM: MRI THORACIC SPINE WITHOUT CONTRAST TECHNIQUE: Multiplanar, multisequence MR imaging of the thoracic spine was performed. No intravenous contrast was administered. COMPARISON:  AP chest 07/02/2023, chest two views 11/28/2022, CTA chest 05/27/2022 FINDINGS: Alignment:  No sagittal spondylolisthesis. Vertebrae: Vertebral body heights are maintained. Small decreased T1 and decreased T2 signal sclerotic focus within the anterior superior T7 vertebral body is unchanged in size from sclerotic lesion on prior 05/27/2022 CT and likely represents a benign bone island. On prior CT there is a region of focal lucency within the midline posteroinferior T8 vertebral body that is unchanged in size on the current study, demonstrating central decreased T1 and decreased T2 and peripheral increased T1, T2, and STIR  signal. This may represent a benign hemangioma versus benign vascular lesion. Mild to moderate anterior T5-6 and mild anterior T4-5 and T6-7 disc space narrowing. Decreased disc hydration is seen throughout the T2-3 through T8-9 levels, greatest at T5-6. Cord: The thoracic cord demonstrates normal signal and caliber. The conus terminates at the superior L1 level. Paraspinal and other soft tissues: No paravertebral abscess. No significant abnormality is seen within the paraspinal soft tissues. Disc levels: T5-6: Mild posterior disc bulge and mild extension into the inferior aspect of the left neural foramen. Mild left neuroforaminal narrowing. Mild left greater than right lateral recess narrowing. No central canal stenosis. T8-9: Minimal right greater than left posterior disc bulge. Mild right lateral recess narrowing. No central canal or neuroforaminal stenosis. IMPRESSION: 1. T5-6 mild posterior disc bulge extending into the inferior aspect of the left neural foramen. Mild left T5-6  neuroforaminal narrowing. 2. Mild right T8-9 lateral recess narrowing. 3. No central canal stenosis within the thoracic spine. 4. No MRI evidence of discitis/osteomyelitis or paravertebral abscess. Electronically Signed   By: Bertina Broccoli M.D.   On: 07/03/2023 20:32   MR CERVICAL SPINE WO CONTRAST Result Date: 07/03/2023 CLINICAL DATA:  Chronic neck pain. Cervical and lumbar back pain. Cervical pain radiates from right ear to right shoulder. EXAM: MRI CERVICAL SPINE WITHOUT CONTRAST TECHNIQUE: Multiplanar, multisequence MR imaging of the cervical spine was performed. No intravenous contrast was administered. COMPARISON:  None Available. FINDINGS: Alignment: There is reversal of the normal cervical lordosis with mild kyphotic angulation centered at C4-5. The atlantodens interval is intact. Vertebrae: Vertebral body heights are maintained. Mild superior C4 endplate shallow degenerative Schmorl's nodes. Mild anterior C4-5 through C6-7 endplate osteophytes. Minimal anterior C5-6 edematous marrow endplate degenerative changes. No endplate erosion. No acute fracture or destructive bone lesion. Cord: The cervical cord demonstrates normal signal and caliber. Posterior Fossa, vertebral arteries, paraspinal tissues: Negative. Disc levels: C2-3: Moderate left and mild right facet joint hypertrophy. No posterior disc bulge. No central canal or neuroforaminal stenosis. C3-4: Moderate bilateral facet joint hypertrophy. Mild broad-based posterior disc bulge. Mild left intraforaminal disc and endplate spurring. Mild left neuroforaminal stenosis. No central canal stenosis. C4-5: Mild bilateral facet joint hypertrophy. Minimal posterior disc bulge. No central canal or neuroforaminal stenosis. C5-6: Mild-to-moderate bilateral facet joint hypertrophy. Moderate mid to left and moderate far right posterior disc bulges (axial series 4, image 26). Mild mass effect on the left ventral cord. Severe left greater than right lateral recess  stenosis. Moderate to severe right and mild left neuroforaminal stenosis. Overall mild left central canal stenosis. C6-7: Mild bilateral facet joint hypertrophy. Mild left greater than right posterior disc bulge. No central canal or neuroforaminal stenosis. C7-T1: No posterior disc bulge, central canal narrowing, or neuroforaminal stenosis. please see report from contemporaneous MRI thoracic spine for evaluation of the thoracic disc levels. IMPRESSION: 1. At C5-6, moderate mid to left and moderate far right posterior disc bulges with mild mass effect on the left ventral cord. Severe left greater than right lateral recess stenosis. Moderate to severe right and mild left neuroforaminal stenosis. Overall mild left central canal stenosis. 2. At C3-4, mild left neuroforaminal stenosis. Electronically Signed   By: Bertina Broccoli M.D.   On: 07/03/2023 20:17   MR BRAIN WO CONTRAST Result Date: 07/02/2023 CLINICAL DATA:  Neuro deficit, concern for stroke. EXAM: MRI HEAD WITHOUT CONTRAST TECHNIQUE: Multiplanar, multiecho pulse sequences of the brain and surrounding structures were obtained without intravenous contrast. COMPARISON:  Same day CT head. FINDINGS:  Brain: No acute infarct. No evidence of intracranial hemorrhage. T2/FLAIR hyperintensity in the periventricular white matter. No edema, mass effect, or midline shift. Posterior fossa is unremarkable. Normal appearance of midline structures. The basilar cisterns are patent. No extra-axial fluid collections. Ventricles: Normal size and configuration of the ventricles. Vascular: Skull base flow voids are visualized. Skull and upper cervical spine: No focal abnormality. Sinuses/Orbits: Bilateral lens replacement. No significant mucosal thickening in the paranasal sinuses. Other: Right mastoid effusion. IMPRESSION: No acute intracranial abnormality. Mild chronic microvascular ischemic changes. Right mastoid effusion. Electronically Signed   By: Denny Flack M.D.   On:  07/02/2023 18:08   CT Head Wo Contrast Result Date: 07/02/2023 CLINICAL DATA:  Neuro deficit, acute, stroke suspected EXAM: CT HEAD WITHOUT CONTRAST TECHNIQUE: Contiguous axial images were obtained from the base of the skull through the vertex without intravenous contrast. RADIATION DOSE REDUCTION: This exam was performed according to the departmental dose-optimization program which includes automated exposure control, adjustment of the mA and/or kV according to patient size and/or use of iterative reconstruction technique. COMPARISON:  04/04/2022 FINDINGS: Brain: No acute intracranial abnormality. Specifically, no hemorrhage, hydrocephalus, mass lesion, acute infarction, or significant intracranial injury. Mild cerebral atrophy. Vascular: No hyperdense vessel or unexpected calcification. Skull: No acute calvarial abnormality. Sinuses/Orbits: No acute findings Other: None IMPRESSION: No acute intracranial abnormality. Electronically Signed   By: Janeece Mechanic M.D.   On: 07/02/2023 15:38   DG Chest 1 View Result Date: 07/02/2023 CLINICAL DATA:  Chest pain EXAM: CHEST  1 VIEW portable COMPARISON:  X-ray 03/28/2023 FINDINGS: No consolidation, pneumothorax or effusion. Normal cardiopericardial silhouette without edema. Artifact from overlapping gown snaps. Films are under penetrated. IMPRESSION: No acute cardiopulmonary disease. Electronically Signed   By: Adrianna Horde M.D.   On: 07/02/2023 12:55     Discharge Instructions: Discharge Instructions     Diet - low sodium heart healthy   Complete by: As directed    Increase activity slowly   Complete by: As directed        Signed: Fay Hoop, MD [facility] - PGY1 Pager: [phone] 07/04/2023, 3:03 PM    Please contact the on call pager after 5 pm and on weekends at [phone].

## 2023-07-04 NOTE — Evaluation (Signed)
 Occupational Therapy Evaluation Patient Details Name: Ryan Jenkins MRN: [id number] DOB: 1960-09-27 Today's Date: 07/04/2023   History of Present Illness   63 y.o. male presents to [facility] hospital on 07/02/23 with chest pain that radiates into LUE and difficulty standing. Chest pain then subsided and pt began to have LBP and neck pain. PMH includes: diabetes with neuropathy and gastroparesis, HTN, CKD 4, ESRD, HD TThS, depression, anxiety, HLD, GERD, CHF, CAD, anemia     Clinical Impressions Patient admitted for the diagnosis above.  PTA he lives at home with his brother, who works days until 2:30pm.  Patient generally is Mod I from wheelchair level in the home, but does mobilize short distances with the 4WRW, and is currently undergoing HH PT.  Patient is weak and a little unsteady, but states he is close to baseline and no post acute OT is anticipated.  He is scheduled for discharge today.       If plan is discharge home, recommend the following:   Assist for transportation     Functional Status Assessment   Patient has had a recent decline in their functional status and demonstrates the ability to make significant improvements in function in a reasonable and predictable amount of time.     Equipment Recommendations   None recommended by OT     Recommendations for Other Services         Precautions/Restrictions   Precautions Precautions: Fall Recall of Precautions/Restrictions: Intact Restrictions Weight Bearing Restrictions Per Provider Order: No     Mobility Bed Mobility Overal bed mobility: Needs Assistance             General bed mobility comments: up in recliner post PT    Transfers Overall transfer level: Needs assistance Equipment used: Rolling walker (2 wheels) Transfers: Sit to/from Stand, Bed to chair/wheelchair/BSC Sit to Stand: Contact guard assist     Step pivot transfers: Supervision            Balance Overall balance  assessment: Needs assistance Sitting-balance support: Feet supported Sitting balance-Leahy Scale: Good     Standing balance support: Reliant on assistive device for balance Standing balance-Leahy Scale: Poor                             ADL either performed or assessed with clinical judgement   ADL       Grooming: Set up;Sitting               Lower Body Dressing: Supervision/safety;Sitting/lateral leans;Sit to/from stand;Contact guard assist   Toilet Transfer: Supervision/safety;Regular Toilet;Rolling walker (2 wheels);Ambulation                   Vision Patient Visual Report: No change from baseline       Perception Perception: Not tested       Praxis Praxis: Not tested       Pertinent Vitals/Pain Pain Assessment Pain Assessment: No/denies pain     Extremity/Trunk Assessment Upper Extremity Assessment Upper Extremity Assessment: Overall WFL for tasks assessed   Lower Extremity Assessment Lower Extremity Assessment: Defer to PT evaluation   Cervical / Trunk Assessment Cervical / Trunk Assessment: Normal   Communication Communication Communication: No apparent difficulties   Cognition Arousal: Alert Behavior During Therapy: WFL for tasks assessed/performed Cognition: No apparent impairments  Following commands: Intact       Cueing  General Comments   Cueing Techniques: Verbal cues   VSS on RA   Exercises     Shoulder Instructions      Home Living Family/patient expects to be discharged to:: Private residence Living Arrangements: Other relatives Available Help at Discharge: Family;Available PRN/intermittently Type of Home: Apartment Home Access: Level entry     Home Layout: One level     Bathroom Shower/Tub: Chief Strategy Officer: Standard Bathroom Accessibility: Yes How Accessible: Accessible via wheelchair;Accessible via walker Home Equipment: Rolling  Walker (2 wheels);Rollator (4 wheels);Wheelchair - manual;Shower seat          Prior Functioning/Environment Prior Level of Function : Independent/Modified Independent             Mobility Comments: Uses w/c in the home and 4WRW in the community for short distances. ADLs Comments: Mod I for ADL and iADL from w/c level.    OT Problem List: Impaired balance (sitting and/or standing);Decreased activity tolerance;Decreased strength   OT Treatment/Interventions:        OT Goals(Current goals can be found in the care plan section)   Acute Rehab OT Goals Patient Stated Goal: Return home OT Goal Formulation: With patient Time For Goal Achievement: 07/18/23 Potential to Achieve Goals: Good   OT Frequency:       Co-evaluation              AM-PAC OT 6 Clicks Daily Activity     Outcome Measure Help from another person eating meals?: None Help from another person taking care of personal grooming?: None Help from another person toileting, which includes using toliet, bedpan, or urinal?: A Little Help from another person bathing (including washing, rinsing, drying)?: A Little Help from another person to put on and taking off regular upper body clothing?: None Help from another person to put on and taking off regular lower body clothing?: A Little 6 Click Score: 21   End of Session Equipment Utilized During Treatment: Gait belt;Rolling walker (2 wheels) Nurse Communication: Mobility status  Activity Tolerance: Patient tolerated treatment well Patient left: in chair;with call bell/phone within reach  OT Visit Diagnosis: Unsteadiness on feet (R26.81);Muscle weakness (generalized) (M62.81)                Time: 1130-1150 OT Time Calculation (min): 20 min Charges:  OT General Charges $OT Visit: 1 Visit OT Evaluation $OT Eval Moderate Complexity: 1 Mod  07/04/2023  RP, OTR/L  Acute Rehabilitation Services  Office:  [phone]   Ryan Jenkins 07/04/2023,  12:10 PM

## 2023-07-04 NOTE — Plan of Care (Signed)

## 2023-07-05 ENCOUNTER — Telehealth: Payer: Self-pay | Admitting: Nurse Practitioner

## 2023-07-05 ENCOUNTER — Encounter: Payer: Self-pay | Admitting: Infectious Diseases

## 2023-07-05 DIAGNOSIS — Z789 Other specified health status: Secondary | ICD-10-CM | POA: Insufficient documentation

## 2023-07-05 LAB — HEPATITIS B SURFACE ANTIBODY, QUANTITATIVE: Hep B S AB Quant (Post): 11.9 m[IU]/mL

## 2023-07-05 NOTE — Discharge Planning (Signed)
 [facility] Patient Discharge Orders- [facility] CLINIC: [place]  Patient's name: KELDEN LAVALLEE Admit/DC Dates: 07/02/2023 - 07/04/2023  Discharge Diagnoses: Weakness generalized    Chronic bilateral low back pain with right-sided sciatica   Aranesp : Given: No   Date and amount of last dose: NA   PRBC's Given: No Date/# of units: NA Last Hgb: 11.4 ESA dose for discharge: ESA per protocol IV Iron dose at discharge: Per protocol  Heparin  change: No  EDW Change: Yes New EDW: 92 kg  Bath Change: Yes 3.0 K bath with weekly K+ levels please.   Access intervention/Change: NA Details:  Hectorol/Calcitriol  change: No  Discharge Labs: Calcium  8.5 Phosphorus 2.9 Albumin   NA K+ 3.0  IV Antibiotics: No Details:  On Coumadin ?: No Last INR: Next INR: Managed By:   OTHER/APPTS/LAB ORDERS:    D/C Meds to be reconciled by nurse after every discharge.  Completed By: Jacobo Masters [facility] [phone]    Reviewed by: MD:______ RN_______

## 2023-07-05 NOTE — Telephone Encounter (Signed)
 Transition of care contact from inpatient facility  Date of Discharge: 06/19/205 Date of Contact: 07/05/2023 Method of contact: Phone  Attempted to contact patient to discuss transition of care from inpatient admission. Patient did not answer the phone. Message was left on the patient's voicemail with call back number [phone].

## 2023-07-06 ENCOUNTER — Telehealth (HOSPITAL_COMMUNITY): Payer: Self-pay | Admitting: Nephrology

## 2023-07-06 NOTE — Telephone Encounter (Signed)
 Transition of care contact from inpatient facility  Date of Discharge: 07/04/23 Date of Contact: 07/06/23 - attempt #2 Method of contact: Phone  Attempted to contact patient to discuss transition of care from inpatient admission. Patient did not answer the phone. Message was left on the patient's voicemail with call back number [phone].  Izetta Boehringer, PA-C [employer] Pager [phone]

## 2023-07-17 ENCOUNTER — Encounter: Payer: Self-pay | Admitting: Neurology

## 2023-07-17 ENCOUNTER — Ambulatory Visit (INDEPENDENT_AMBULATORY_CARE_PROVIDER_SITE_OTHER): Admitting: Neurology

## 2023-07-17 VITALS — BP 166/81 | HR 97 | Ht 74.0 in | Wt 206.0 lb

## 2023-07-17 DIAGNOSIS — R29898 Other symptoms and signs involving the musculoskeletal system: Secondary | ICD-10-CM

## 2023-07-17 DIAGNOSIS — R269 Unspecified abnormalities of gait and mobility: Secondary | ICD-10-CM

## 2023-07-17 DIAGNOSIS — M79601 Pain in right arm: Secondary | ICD-10-CM

## 2023-07-17 DIAGNOSIS — R531 Weakness: Secondary | ICD-10-CM

## 2023-07-17 DIAGNOSIS — E1140 Type 2 diabetes mellitus with diabetic neuropathy, unspecified: Secondary | ICD-10-CM

## 2023-07-17 DIAGNOSIS — Z794 Long term (current) use of insulin: Secondary | ICD-10-CM

## 2023-07-17 NOTE — Progress Notes (Signed)
 Follow-up Visit   Date: 07/17/2023    Ryan Jenkins MRN: [id number] DOB: 12-04-1960    Ryan Jenkins a 63 y.o. right-handed African American male with uncontrolled diabetes mellitus (HbA1c 11.8) complicated by retinopathy, neuropathy and CKD, hyperlipidemia, hypertension, GERD, BPH, anemia, depression, GAD, and ESRD on dialysis (Tue, Thur, Sat) returning to the clinic for follow-up of gait difficulty.  The patient was accompanied to the clinic by self.   IMPRESSION/PLAN: Right arm pain, possibly due to cervical radiculopathy.  Imaging shows biforaminal stenosis at C5-6, which is worse on the right.    - NCS/EMG of the right arm   - Continue pregabalin 50mg  daily (started by PCP)  - Home PT for neck ROM  2.  Bilateral leg weakness.  No evidence of canal stenosis or myopathy.  His acute leg weakness has improved and most likely due to hypokalemia.  He continues to have baseline weakness and gait difficulty.  There is deconditioning contributing to this.  - NCS/EMG of the right leg to further evaluate symptoms.    - Home PT for leg strengthening and balance  3.  Longstanding history of diabetic neuropathy affecting stocking-glove distrubution with predominately numbness involving the lower legs, feet, and hands.    - Home OT for hand weakness  - He is compliant with using walker  - Fall precautions discussed  Return to clinic in 4 months  --------------------------------------------- History of present illness: He has a long history of diabetes and was diagnosed in 74.  He has symptoms of neuropathy for the past 10 + years.  He has numbness involving the feet, imbalance, and weakness in the feet.  He also has numbness in the hands. He has been using a rollator since 2021.  He denies tingling, burning, or stabbing pain.  He was on gabapentin  300mg  TID and did not have any relief, so PCP stopped it.  He also complains of right shoulder pain and muscle pain involving the  back of the upper arm.    He lives at home with brother in a one-level home.  Nonsmoker and does not drink alcohol.    UPDATE 07/17/2023:  He is here for follow-up after presenting to the ER on 6/17 with bilateral leg weakness.  He was hospitalized at [facility] from 6/17 - 6/19 because of acute onset of bilateral leg weakness, where he was unable to stand up to put his pants on, so called EMS.  MRI of the cervical, thoracic, and lumbar spine showed mild disc bulge at C5-6 and T5-6.  There is lateral recess stenosis at C5-6 worse on the left and moderate L4-5 neuroforaminal stenosis.  CK was normal.  Labs were notable for potassium of 3.1 and it was felt that his weakness was due to electrolyte imbalance.  He was able to stand up and walk by day 2 and discharged home with home PT.   Today, he is able to walk closer to his baseline with his walker.  He complains of sharp pain in his low back as well as neck pain involving the right arm.  PCP started him Lyrica 50mg  which he took yesterday.  He is getting home PT for balance.    He lives at home with brother.  Over the past two weeks, home health is also coming to assist him.  Medications:  Current Outpatient Medications on File Prior to Visit  Medication Sig Dispense Refill   acetaminophen  (TYLENOL ) 650 MG CR tablet Take 650 mg by mouth  every 8 (eight) hours as needed for pain.     amLODipine  (NORVASC ) 5 MG tablet Take 5 mg by mouth See admin instructions. Take 1 tablet (5mg ) by mouth once daily - on Tuesday, Thursday and Saturday take 1 tablet in the afternoon, after dialysis. On non-dialysis days, take 1 tablet every morning.     BELSOMRA 5 MG TABS Take 1 tablet by mouth at bedtime.     calcium  acetate (PHOSLO) 667 MG capsule Take 1,334 mg by mouth 3 (three) times daily with meals.     famotidine  (PEPCID ) 40 MG tablet Take 40 mg by mouth daily in the afternoon.     furosemide  (LASIX ) 40 MG tablet Take 40 mg by mouth See admin instructions. Take 1 tablet  (40mg ) by mouth daily. On Tuesday, Thursday and Saturday take 1 tablet in the afternoon, after dialysis. On non-dialysis days, take 1 tablet every morning.     hydrALAZINE  (APRESOLINE ) 100 MG tablet Take 100 mg by mouth 3 (three) times daily.     LANTUS  SOLOSTAR 100 UNIT/ML Solostar Pen Inject 5 Units into the skin at bedtime. (Patient taking differently: Inject 15 Units into the skin at bedtime.)     meclizine  (ANTIVERT ) 25 MG tablet Take 25 mg by mouth at bedtime.     metoprolol  succinate (TOPROL -XL) 25 MG 24 hr tablet Take 0.5 tablets (12.5 mg total) by mouth daily. (Patient taking differently: Take 12.5 mg by mouth See admin instructions. Take 1/2 tablet (12.5mg ) by mouth daily. On Tuesday, Thursday and Saturday take 1/2 tablet in the afternoon, after dialysis. On non-dialysis days take 1/2 tablet every morning.) 15 tablet 2   multivitamin (RENA-VIT) TABS tablet Take 1 tablet by mouth daily in the afternoon.     pioglitazone (ACTOS) 15 MG tablet Take 15 mg by mouth daily.     polyethylene glycol (MIRALAX  / GLYCOLAX ) 17 g packet Take 17 g by mouth daily as needed (constipation).     rosuvastatin  (CRESTOR ) 10 MG tablet Take 10 mg by mouth at bedtime.     sevelamer  carbonate (RENVELA ) 800 MG tablet Take 1,600 mg by mouth 3 (three) times daily with meals.     tamsulosin  (FLOMAX ) 0.4 MG CAPS capsule Take 0.8 mg by mouth daily.     No current facility-administered medications on file prior to visit.    Allergies:  Allergies  Allergen Reactions   Claritin [Loratadine] Swelling and Other (See Comments)    Joint swelling   Latex Hives and Swelling    catheter   Hydrochlorothiazide  Other (See Comments)    Dizziness   Lyrica [Pregabalin] Other (See Comments)    Depression Makes me loopy    Metformin And Related Diarrhea and Nausea And Vomiting    Vital Signs:  BP (!) 166/81   Pulse 97   Ht 6' 2 (1.88 m)   Wt 206 lb (93.4 kg)   SpO2 100%   BMI 26.45 kg/m   Neurological Exam: MENTAL  STATUS including orientation to time, place, person, recent and remote memory, attention span and concentration, language, and fund of knowledge is normal.  Speech is not dysarthric.  CRANIAL NERVES:  No visual field defects.  Pupils equal round and reactive to light.  Normal conjugate, extra-ocular eye movements in all directions of gaze.  No ptosis.  Face is symmetric. Palate elevates symmetrically.  Tongue is midline.  MOTOR:  Generalized loss of muscle bulk in the arms and legs with FDI atrophy bilaterally.  No fasciculations or abnormal movements.  No pronator drift.    Upper Extremity:  Right   Left  Deltoid  5/5    5/5   Biceps  5/5    5/5   Triceps  5/5    5/5   Wrist extensors  5/5    5/5   Wrist flexors  5/5    5/5   Finger extensors  5-/5    5-/5   Finger flexors  5-/5    5-/5   Dorsal interossei  4/5    4/5   Abductor pollicis  4/5    4/5   Tone (Ashworth scale)  0   0    Lower Extremity:  Right   Left  Hip flexors  4/5    4/5   Knee flexors  5/5    5/5   Knee extensors  5/5    5/5   Dorsiflexors  5-/5    5-/5   Plantarflexors  5-/5    5-/5   Toe extensors  4/5    4/5   Toe flexors  4/5    4/5   Tone (Ashworth scale)  0   0    MSRs:                                              Right        Left brachioradialis 1+   1+  biceps 1+   1+  triceps 1+   1+  patellar 0   0  ankle jerk 0   0  plantar response down   down    SENSORY:  Vibration reduced at the knees and absent below the ankles.  Loss of temperature from mid-calf into the feet. Sensation intact in the hands.    COORDINATION/GAIT: Normal finger-to- nose-finger. Unable to stand up from chair without using arms to push off.  Gait is assisted with rollator, mildly wide-base, stable, and slow.       Data: Lab Results  Component Value Date   HGBA1C 10.6 (H) 07/03/2023    Lab Results  Component Value Date   CKTOTAL 177 07/04/2023   Lab Results  Component Value Date   TSH 3.713 07/04/2023   Lab  Results  Component Value Date   VITAMINB12 370 02/03/2021    MRI cervical spine wo contrast 07/03/2023: 1. At C5-6, moderate mid to left and moderate far right posterior disc bulges with mild mass effect on the left ventral cord. Severe left greater than right lateral recess stenosis. Moderate to severe right and mild left neuroforaminal stenosis. Overall mild left central canal stenosis. 2. At C3-4, mild left neuroforaminal stenosis.  MRI thoracic spine wo contrast 07/03/2023: 1. T5-6 mild posterior disc bulge extending into the inferior aspect of the left neural foramen. Mild left T5-6 neuroforaminal narrowing. 2. Mild right T8-9 lateral recess narrowing. 3. No central canal stenosis within the thoracic spine. 4. No MRI evidence of discitis/osteomyelitis or paravertebral abscess.   MRI lumbar spine wo contrast 07/03/2023: 1. No evidence of discitis/osteomyelitis. 2. Multilevel degenerative disc and joint changes as above. 3. At L4-5, moderate right greater than left neuroforaminal stenosis is slightly worsened from prior. Moderate right and mild left lateral recess stenosis is mildly worsened from prior. Borderline mild central canal stenosis is new. 4. At L5-S1, mild left neuroforaminal stenosis and borderline mild right neuroforaminal stenosis, similar to prior. Disc mildly  contacts the bilateral descending L5 nerves, left greater than right, unchanged.  MRI brain wo contrast 07/02/2023: No acute intracranial abnormality. Mild chronic microvascular ischemic changes. Right mastoid effusion.  Total time spent reviewing records, interview, history/exam, documentation, and coordination of care on day of encounter:  40 minutes    Thank you for allowing me to participate in patient's care.  If I can answer any additional questions, I would be pleased to do so.    Sincerely,    Ginamarie Banfield K. Tobie, DO

## 2023-07-17 NOTE — Patient Instructions (Signed)
ELECTROMYOGRAM AND NERVE CONDUCTION STUDIES (EMG/NCS) INSTRUCTIONS  How to Prepare The neurologist conducting the EMG will need to know if you have certain medical conditions. Tell the neurologist and other EMG lab personnel if you: . Have a pacemaker or any other electrical medical device . Take blood-thinning medications . Have hemophilia, a blood-clotting disorder that causes prolonged bleeding Bathing Take a shower or bath shortly before your exam in order to remove oils from your skin. Don't apply lotions or creams before the exam.  What to Expect You'll likely be asked to change into a hospital gown for the procedure and lie down on an examination table. The following explanations can help you understand what will happen during the exam.  . Electrodes. The neurologist or a technician places surface electrodes at various locations on your skin depending on where you're experiencing symptoms. Or the neurologist may insert needle electrodes at different sites depending on your symptoms.  . Sensations. The electrodes will at times transmit a tiny electrical current that you may feel as a twinge or spasm. The needle electrode may cause discomfort or pain that usually ends shortly after the needle is removed. If you are concerned about discomfort or pain, you may want to talk to the neurologist about taking a short break during the exam.  . Instructions. During the needle EMG, the neurologist will assess whether there is any spontaneous electrical activity when the muscle is at rest - activity that isn't present in healthy muscle tissue - and the degree of activity when you slightly contract the muscle.  He or she will give you instructions on resting and contracting a muscle at appropriate times. Depending on what muscles and nerves the neurologist is examining, he or she may ask you to change positions during the exam.  After your EMG You may experience some temporary, minor bruising where the  needle electrode was inserted into your muscle. This bruising should fade within several days. If it persists, contact your primary care doctor.    

## 2023-08-30 ENCOUNTER — Encounter: Admitting: Neurology

## 2023-09-17 NOTE — Therapy (Incomplete)
 OUTPATIENT PHYSICAL THERAPY CERVICAL EVALUATION   Patient Name: Ryan Jenkins MRN: [id number] DOB:03-23-1960, 63 y.o., male Today's Date: 09/17/2023  END OF SESSION:   Past Medical History:  Diagnosis Date   Anemia    Anginal pain (HCC)    Anxiety    Arthritis    BPH with obstruction/lower urinary tract symptoms    CAD (coronary artery disease)    cardiologist--- dr marcello badal;  11-06-2019 cardiac cath in [place]  (result in care everywhere)  nonobstructive cad involing pRCA 60% (done in setting worseing CHF/ acute pulmonary edema requiring intubation/ AKI   Chronic combined systolic and diastolic CHF (congestive heart failure) (HCC)    Chronic kidney disease, stage IV (severe) (HCC)    nephrologist--- dr dolan   Depression    Diabetic neuropathy (HCC)    Dyspnea    Edema of both lower extremities    Gastric ulcer without hemorrhage or perforation 12/25/2018   GERD (gastroesophageal reflux disease)    Heart murmur    Hemodialysis patient (HCC)    History of community acquired pneumonia    ad[facility]04-20[facility]  w/ [facility]F hypoxia w/ severe sepsis   Hyperlipidemia    Hypertension    Insulin  dependent type 2 diabetes mellitus (HCC)    LBBB (left bundle branch block)    Pneumonia    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)    Uses walker    Vitamin B12 deficiency    Vitreous hemorrhage [facility])    Past Surgical History:  Procedure Laterality Date   A/V FISTULAGRAM Right 07/17/2022   Procedure: A/V Fistulagram;  Surgeon: Ryan Lonni RAMAN, MD;  Location: [facility] INVASIVE CV LAB;  Service: Vascular;  Laterality: Right;   A/V FISTULAGRAM N/A 02/01/2023   Procedure: A/V Fistulagram;  Surgeon: Ryan Gordy POUR, MD;  Location: [facility] INVASIVE CV LAB;  Service: Cardiovascular;  Laterality: N/A;   A/V SHUNT INTERVENTION N/A 05/10/2023   Procedure: A/V SHUNT INTERVENTION;  Surgeon: Ryan Lynwood ORN, MD;  Location: [facility] INVASIVE CV LAB;  Service: Cardiovascular;  Laterality: N/A;    A/V SHUNT INTERVENTION N/A 06/21/2023   Procedure: A/V SHUNT INTERVENTION;  Surgeon: Ryan Gaile ORN, MD;  Location: [facility] PV LAB;  Service: Cardiovascular;  Laterality: N/A;   AV FISTULA PLACEMENT Right 05/24/2022   Procedure: RADIOCEPHALIC ARTERIOVENOUS (AV) FISTULA CREATION;  Surgeon: Ryan Fonda BRAVO, MD;  Location: [facility] OR;  Service: Vascular;  Laterality: Right;   CARDIAC CATHETERIZATION  11/06/2019   [facility] in [place] ;    nonobstructive CAD , pRCA 60% (result in care everywhere)   CATARACT EXTRACTION W/ INTRAOCULAR LENS IMPLANT Bilateral 2017   IR FLUORO GUIDE CV LINE RIGHT  05/23/2022   IR US  GUIDE VASC ACCESS RIGHT  05/23/2022   PATCH ANGIOPLASTY Right 09/18/2022   Procedure: BOVINE PATCH ANGIOPLASTY 1CM x 6CM;  Surgeon: Ryan Lonni RAMAN, MD;  Location: [facility] OR;  Service: Vascular;  Laterality: Right;   PERIPHERAL VASCULAR BALLOON ANGIOPLASTY Right 02/01/2023   Procedure: PERIPHERAL VASCULAR BALLOON ANGIOPLASTY;  Surgeon: Ryan Gordy POUR, MD;  Location: [facility] INVASIVE CV LAB;  Service: Cardiovascular;  Laterality: Right;  AA and Cphalic inflow vein   REVISON OF ARTERIOVENOUS FISTULA Right 09/18/2022   Procedure: REVISON OF RIGHT UPPER ARM ARTERIOVENOUS FISTULA;  Surgeon: Ryan Lonni RAMAN, MD;  Location: [facility] OR;  Service: Vascular;  Laterality: Right;   TRANSURETHRAL RESECTION OF PROSTATE N/A 10/16/2021   Procedure: TRANSURETHRAL RESECTION OF THE PROSTATE (TURP);  Surgeon: Ryan Donnice JONELLE, MD;  Location: [facility] ORS;  Service: Urology;  Laterality: N/A;   VENOUS ANGIOPLASTY Right 05/10/2023   Procedure: VENOUS ANGIOPLASTY;  Surgeon: Ryan Lynwood ORN, MD;  Location: [facility] INVASIVE CV LAB;  Service: Cardiovascular;  Laterality: Right;  70% Cephalic Arch   Patient Active Problem List   Diagnosis Date Noted   Hepatitis B immune 07/05/2023   Weakness generalized 07/03/2023   ESRD on dialysis (HCC) 07/03/2023   Chronic bilateral low back pain with right-sided sciatica 07/03/2023    History of ESBL Klebsiella pneumoniae infection 01/03/2023   Recurrent UTI 12/26/2022   Medication management 12/26/2022   Acute respiratory failure with hypoxia (HCC) 05/27/2022   AKI (acute kidney injury) (HCC) 05/23/2022   CKD (chronic kidney disease) stage 5, GFR less than 15 ml/min (HCC) 04/24/2022   Diabetic gastroparesis (HCC) 04/09/2022   Chronic diastolic heart failure (HCC) 04/04/2022   Anemia in chronic kidney disease (CKD) 10/18/2021   BPH (benign prostatic hyperplasia) 10/16/2021   Coronary artery disease 02/07/2021   Erectile dysfunction associated with type 2 diabetes mellitus (HCC) 08/07/2019   Vitreous hemorrhage of left eye (HCC) 07/22/2019   Vision loss of left eye 07/22/2019   History of medication noncompliance 04/02/2019   Gastroesophageal reflux disease without esophagitis 09/04/2018   Diabetic retinopathy of both eyes associated with type 2 diabetes mellitus (HCC) 01/03/2018   Intermittent diarrhea 09/27/2017   Macroalbuminuric diabetic nephropathy (HCC) 06/25/2017   History of falling 06/25/2017   HLD (hyperlipidemia) 03/21/2017   Vitamin B 12 deficiency 03/21/2017   DM2 (diabetes mellitus, type 2) (HCC) 02/05/2017   Essential hypertension 02/05/2017   Depression 02/05/2017   Unintended weight loss 02/05/2017   Gait disturbance 02/05/2017   Pronation deformity of both feet 05/11/2014   Diabetic neuropathy, type II diabetes mellitus (HCC) 05/11/2014   Metatarsal deformity 05/11/2014    [facility] [address]   REFERRING PROVIDER: Gillie Duncans, MD   REFERRING DIAG: [phone] (ICD-10-CM) - Other spondylosis with radiculopathy, cervical region   THERAPY DIAG:  No diagnosis found.  Rationale for Evaluation and Treatment: Rehabilitation  ONSET DATE: ***  SUBJECTIVE:                                                                                                                                                                                                          SUBJECTIVE STATEMENT: ***  Hand dominance: {MISC; OT HAND [phone]}  PERTINENT HISTORY:  ***  PAIN:  Are you having pain? Yes: NPRS scale: *** Pain location: *** Pain description: *** Aggravating factors: *** Relieving factors: ***  PRECAUTIONS: {Therapy precautions:24002}  RED  FLAGS: {PT Red Flags:29287}     WEIGHT BEARING RESTRICTIONS: {Yes ***/No:24003}  FALLS:  Has patient fallen in last 6 months? {fallsyesno:27318}  LIVING ENVIRONMENT: Lives with: {[facility] lives with:25569::lives with their family} Lives in: {Lives in:25570} Stairs: {opstairs:27293} Has following equipment at home: {Assistive devices:23999}  OCCUPATION: ***  PLOF: {PLOF:24004}  PATIENT GOALS: ***  NEXT MD VISIT: ***  OBJECTIVE:  Note: Objective measures were completed at Evaluation unless otherwise noted.  DIAGNOSTIC FINDINGS:  ***  PATIENT SURVEYS:  {rehab surveys:24030}  COGNITION: Overall cognitive status: {cognition:24006}  SENSATION: {sensation:27233}  POSTURE: {posture:25561}  PALPATION: ***   CERVICAL ROM:   {AROM/PROM:27142} ROM A/PROM (deg) eval  Flexion   Extension   Right lateral flexion   Left lateral flexion   Right rotation   Left rotation    (Blank rows = not tested)  UPPER EXTREMITY ROM:  {AROM/PROM:27142} ROM Right eval Left eval  Shoulder flexion    Shoulder extension    Shoulder abduction    Shoulder adduction    Shoulder extension    Shoulder internal rotation    Shoulder external rotation    Elbow flexion    Elbow extension    Wrist flexion    Wrist extension    Wrist ulnar deviation    Wrist radial deviation    Wrist pronation    Wrist supination     (Blank rows = not tested)  UPPER EXTREMITY MMT:  MMT Right eval Left eval  Shoulder flexion    Shoulder extension    Shoulder abduction    Shoulder adduction    Shoulder extension    Shoulder internal rotation    Shoulder external rotation    Middle  trapezius    Lower trapezius    Elbow flexion    Elbow extension    Wrist flexion    Wrist extension    Wrist ulnar deviation    Wrist radial deviation    Wrist pronation    Wrist supination    Grip strength     (Blank rows = not tested)  CERVICAL SPECIAL TESTS:  {Cervical special tests:25246}  FUNCTIONAL TESTS:  {Functional tests:24029}  TREATMENT DATE:  [facility] Adult PT Treatment:                                                DATE: 09/18/23 Therapeutic Exercise: *** Manual Therapy: *** Neuromuscular re-ed: *** Therapeutic Activity: *** Modalities: *** Self Care: ***                                                                                                                                  PATIENT EDUCATION:  Education details: *** Person educated: {Person educated:25204} Education method: {Education Method:25205} Education comprehension: {Education Comprehension:25206}  HOME EXERCISE PROGRAM: ***  ASSESSMENT:  CLINICAL IMPRESSION: Patient is  a 63 y.o. male who was seen today for physical therapy evaluation and treatment for M47.22 (ICD-10-CM) - Other spondylosis with radiculopathy, cervical region .   OBJECTIVE IMPAIRMENTS: {opptimpairments:25111}.   ACTIVITY LIMITATIONS: {activitylimitations:27494}  PARTICIPATION LIMITATIONS: {participationrestrictions:25113}  PERSONAL FACTORS: {Personal factors:25162} are also affecting patient's functional outcome.   REHAB POTENTIAL: {rehabpotential:25112}  CLINICAL DECISION MAKING: {clinical decision making:25114}  EVALUATION COMPLEXITY: {Evaluation complexity:25115}   GOALS: Goals reviewed with patient? {yes/no:20286}  SHORT TERM GOALS: Target date: ***  *** Baseline:  Goal status: INITIAL  2.  *** Baseline:  Goal status: INITIAL  3.  *** Baseline:  Goal status: INITIAL  4.  *** Baseline:  Goal status: INITIAL  5.  *** Baseline:  Goal status: INITIAL  6.  *** Baseline:  Goal status:  INITIAL  LONG TERM GOALS: Target date: ***  *** Baseline:  Goal status: INITIAL  2.  *** Baseline:  Goal status: INITIAL  3.  *** Baseline:  Goal status: INITIAL  4.  *** Baseline:  Goal status: INITIAL  5.  *** Baseline:  Goal status: INITIAL  6.  *** Baseline:  Goal status: INITIAL   PLAN:  PT FREQUENCY: {rehab frequency:25116}  PT DURATION: {rehab duration:25117}  PLANNED INTERVENTIONS: {rehab planned interventions:25118::97110-Therapeutic exercises,97530- Therapeutic [phone]- Neuromuscular re-education,97535- Self Rjmz,02859- Manual therapy}  PLAN FOR NEXT SESSION: PIERRETTE Dasie Daft, PT 09/17/2023, 7:28 PM

## 2023-09-18 ENCOUNTER — Ambulatory Visit

## 2023-10-03 ENCOUNTER — Encounter (HOSPITAL_COMMUNITY): Payer: Self-pay | Admitting: Surgery

## 2023-10-03 ENCOUNTER — Encounter (HOSPITAL_COMMUNITY)
Admission: RE | Disposition: A | Discharge status: Home / Self Care | Payer: Self-pay | Source: Home / Self Care | Attending: Surgery

## 2023-10-03 ENCOUNTER — Ambulatory Visit (HOSPITAL_COMMUNITY)
Admission: RE | Admit: 2023-10-03 | Discharge: 2023-10-03 | Disposition: A | Discharge status: Home / Self Care | Attending: Surgery | Admitting: Surgery

## 2023-10-03 ENCOUNTER — Other Ambulatory Visit: Payer: Self-pay

## 2023-10-03 DIAGNOSIS — Z992 Dependence on renal dialysis: Secondary | ICD-10-CM | POA: Diagnosis not present

## 2023-10-03 DIAGNOSIS — Y832 Surgical operation with anastomosis, bypass or graft as the cause of abnormal reaction of the patient, or of later complication, without mention of misadventure at the time of the procedure: Secondary | ICD-10-CM | POA: Diagnosis not present

## 2023-10-03 DIAGNOSIS — E1122 Type 2 diabetes mellitus with diabetic chronic kidney disease: Secondary | ICD-10-CM | POA: Insufficient documentation

## 2023-10-03 DIAGNOSIS — N186 End stage renal disease: Secondary | ICD-10-CM | POA: Insufficient documentation

## 2023-10-03 DIAGNOSIS — I132 Hypertensive heart and chronic kidney disease with heart failure and with stage 5 chronic kidney disease, or end stage renal disease: Secondary | ICD-10-CM | POA: Insufficient documentation

## 2023-10-03 DIAGNOSIS — Z79899 Other long term (current) drug therapy: Secondary | ICD-10-CM | POA: Insufficient documentation

## 2023-10-03 DIAGNOSIS — I2510 Atherosclerotic heart disease of native coronary artery without angina pectoris: Secondary | ICD-10-CM | POA: Diagnosis not present

## 2023-10-03 DIAGNOSIS — T82868A Thrombosis of vascular prosthetic devices, implants and grafts, initial encounter: Secondary | ICD-10-CM | POA: Diagnosis present

## 2023-10-03 DIAGNOSIS — E7800 Pure hypercholesterolemia, unspecified: Secondary | ICD-10-CM | POA: Diagnosis not present

## 2023-10-03 HISTORY — PX: DIALYSIS/PERMA CATHETER INSERTION: CATH118288

## 2023-10-03 LAB — GLUCOSE, CAPILLARY: Glucose-Capillary: 174 mg/dL — ABNORMAL HIGH (ref 70–99)

## 2023-10-03 SURGERY — DIALYSIS/PERMA CATHETER INSERTION
Anesthesia: LOCAL

## 2023-10-03 MED ORDER — MIDAZOLAM HCL 2 MG/2ML IJ SOLN
INTRAMUSCULAR | Status: DC | PRN
  Administered 2023-10-03: 1 mg via INTRAVENOUS

## 2023-10-03 MED ORDER — HEPARIN SODIUM (PORCINE) 1000 UNIT/ML IJ SOLN
INTRAMUSCULAR | Status: DC | PRN
  Administered 2023-10-03: 3800 [IU] via INTRAVENOUS

## 2023-10-03 MED ORDER — HEPARIN (PORCINE) IN NACL 1000-0.9 UT/500ML-% IV SOLN
INTRAVENOUS | Status: DC | PRN
  Administered 2023-10-03: 500 mL

## 2023-10-03 MED ORDER — MIDAZOLAM HCL 2 MG/2ML IJ SOLN
INTRAMUSCULAR | Status: AC
  Filled 2023-10-03: qty 2

## 2023-10-03 MED ORDER — FENTANYL CITRATE (PF) 100 MCG/2ML IJ SOLN
INTRAMUSCULAR | Status: DC | PRN
  Administered 2023-10-03: 50 ug via INTRAVENOUS

## 2023-10-03 MED ORDER — HEPARIN SODIUM (PORCINE) 1000 UNIT/ML IJ SOLN
INTRAMUSCULAR | Status: AC
  Filled 2023-10-03: qty 10

## 2023-10-03 MED ORDER — FENTANYL CITRATE (PF) 100 MCG/2ML IJ SOLN
INTRAMUSCULAR | Status: AC
  Filled 2023-10-03: qty 2

## 2023-10-03 MED ORDER — LIDOCAINE HCL (PF) 1 % IJ SOLN
INTRAMUSCULAR | Status: DC | PRN
  Administered 2023-10-03: 10 mL
  Administered 2023-10-03: 20 mL

## 2023-10-03 MED ORDER — LIDOCAINE HCL (PF) 1 % IJ SOLN
INTRAMUSCULAR | Status: AC
  Filled 2023-10-03: qty 30

## 2023-10-03 MED ORDER — IODIXANOL 320 MG/ML IV SOLN
INTRAVENOUS | Status: DC | PRN
  Administered 2023-10-03: 10 mL via INTRAVENOUS

## 2023-10-03 SURGICAL SUPPLY — 4 items
CATH PALINDROME-P 23 W/VT (CATHETERS) IMPLANT
KIT MICROPUNCTURE NIT STIFF (SHEATH) IMPLANT
SHEATH PROBE COVER 6X72 (BAG) IMPLANT
TRAY PV CATH (CUSTOM PROCEDURE TRAY) ×1 IMPLANT

## 2023-10-03 NOTE — Op Note (Signed)
    Patient name: Ryan Jenkins MRN: [id number] DOB: [birth year] Sex: male  10/03/2023 Pre-operative Diagnosis: ESRD Post-operative diagnosis:  Same Surgeon:  Malvina New Procedure:   #1: Ultrasound-guided access, right internal jugular vein   #2: 23 cm tunneled dialysis catheter under fluoroscopic guidance   #3: Central venogram  Anesthesia: Conscious sedation  Indications: This is a 27 gentleman with his right arm fistula that has thrombosed.  I did not feel like it was salvageable and recommended a catheter.  Procedure:  The patient was identified in the holding area and taken to [facility]  The patient was then placed supine on the table.  Conscious sedation was utilized with IV fentanyl  and Versed .  Continuous cardiopulmonary monitoring was performed by myself and the staff.  Ultrasound used to evaluate the right internal jugular vein which was widely patent and easily compressible.  A #11 blade was used to make a skin nick.  1% lidocaine  was used local anesthesia.  The vein was cannulated under ultrasound guidance.  I had difficulty getting through the anterior wall but ultimately did.  I advanced a 018 wire and I did not like the course of the wire.  I elected paste the dilator puncture sheath over the wire and performed angiography which showed that I cannulated the carotid artery.  I removed the micropuncture dilator and held pressure for 8 minutes.  I then cannulated the internal jugular vein under ultrasound guidance.  A wire was advanced into the central venous system followed placement micropuncture sheath.  An 035 wire was then advanced into the inferior vena cava I performed a showed patency of the central venous system.  The subcutaneous tissue was then dilated with sequential dilators and the peel-away sheath was placed.  I selected a skin exit site in the right chest.  A skin nick was made and then I anesthetized the subcutaneous tract with lidocaine .  A tunnel was then  created with a tunneler and the catheter was fed through the peel-away sheath which was removed.  The catheter tip was at the cavoatrial junction.  Both ports flushed and aspirated without difficulty.  The cath was filled with the appropriate vies a heparin .  The neck incision was closed with 4 Monocryl.  The catheter was sutured to the skin with 2-0 nylon.  Sterile dressings were applied.  Disposition: #1: Successful placement of a right internal jugular vein tunneled dialysis catheter   #2: Cath is ready for immediate use   #3: Patient be scheduled for new extremity access in the near future.   ALONSO Malvina New, M.D., [facility] [place] Office: [phone] Pager:  [phone]

## 2023-10-03 NOTE — H&P (Signed)
 [facility] [place]  Patient name: Ryan Jenkins MRN: [id number] DOB: 10/11/1960 Sex: male   REASON FOR VISIT:    Clotted dialysis access  HISOTRY OF PRESENT ILLNESS:    Ryan Jenkins is a 63 y.o. male who is status post right upper arm fistula on 05/24/2022.  He underwent branch ligation on 09/18/2022.  He is undergoing percutaneous intervention on 02/01/2023, 05/10/2023, and 06/21/2023.  He comes in due to fistula occlusion.  He has not had dialysis for 5 days  Patient suffers from coronary artery disease.  He is medically managed for hypertension.  He is a type II diabetic.  He takes a statin for hypercholesterolemia.    PAST MEDICAL HISTORY:   Past Medical History:  Diagnosis Date   Anemia    Anginal pain (HCC)    Anxiety    Arthritis    BPH with obstruction/lower urinary tract symptoms    CAD (coronary artery disease)    cardiologist--- dr marcello badal;  11-06-2019 cardiac cath in [place]  (result in care everywhere)  nonobstructive cad involing pRCA 60% (done in setting worseing CHF/ acute pulmonary edema requiring intubation/ AKI   Chronic combined systolic and diastolic CHF (congestive heart failure) (HCC)    Chronic kidney disease, stage IV (severe) (HCC)    nephrologist--- dr dolan   Depression    Diabetic neuropathy (HCC)    Dyspnea    Edema of both lower extremities    Gastric ulcer without hemorrhage or perforation 12/25/2018   GERD (gastroesophageal reflux disease)    Heart murmur    Hemodialysis patient (HCC)    History of community acquired pneumonia    ad[facility]20[facility]  w/ [facility]F hypoxia w/ severe sepsis   Hyperlipidemia    Hypertension    Insulin  dependent type 2 diabetes mellitus (HCC)    LBBB (left bundle branch block)    Pneumonia    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)    Uses walker    Vitamin B12 deficiency    Vitreous hemorrhage (HCC)      FAMILY HISTORY:    Family History  Problem Relation Age of Onset   Renal cancer Mother    Hypertension Mother    Pancreatic cancer Mother    Hypertension Sister    Stroke Sister    Leukemia Maternal Uncle    Sickle cell trait Maternal Aunt    Colon cancer Neg Hx    Esophageal cancer Neg Hx    Rectal cancer Neg Hx    Stomach cancer Neg Hx     SOCIAL HISTORY:   Social History   Tobacco Use   Smoking status: Never    Passive exposure: Never   Smokeless tobacco: Never  Substance Use Topics   Alcohol use: No    Alcohol/week: 0.0 standard drinks of alcohol     ALLERGIES:   Allergies  Allergen Reactions   Claritin [Loratadine] Swelling and Other (See Comments)    Joint swelling   Latex Hives and Swelling    catheter   Hydrochlorothiazide  Other (See Comments)    Dizziness   Lyrica [Pregabalin] Other (See Comments)    Depression Makes me loopy    Metformin And Related Diarrhea and Nausea And Vomiting     CURRENT MEDICATIONS:   No current facility-administered medications for this encounter.    REVIEW OF SYSTEMS:   [X]  denotes positive finding, [ ]  denotes negative finding Cardiac  Comments:  Chest pain or chest pressure:  Shortness of breath upon exertion:    Short of breath when lying flat:    Irregular heart rhythm:        Vascular    Pain in calf, thigh, or hip brought on by ambulation:    Pain in feet at night that wakes you up from your sleep:     Blood clot in your veins:    Leg swelling:         Pulmonary    Oxygen at home:    Productive cough:     Wheezing:         Neurologic    Sudden weakness in arms or legs:     Sudden numbness in arms or legs:     Sudden onset of difficulty speaking or slurred speech:    Temporary loss of vision in one eye:     Problems with dizziness:         Gastrointestinal    Blood in stool:     Vomited blood:         Genitourinary    Burning when urinating:     Blood in urine:        Psychiatric    Major depression:          Hematologic    Bleeding problems:    Problems with blood clotting too easily:        Skin    Rashes or ulcers:        Constitutional    Fever or chills:      PHYSICAL EXAM:   Vitals:   10/03/23 0934 10/03/23 0949  BP: (!) 143/72 132/69  Pulse: 94 92  Resp: 12 14  Temp: 98.1 F (36.7 C)   TempSrc: Oral   SpO2: 100% 98%    GENERAL: The patient is a well-nourished male, in no acute distress. The vital signs are documented above. CARDIAC: There is a regular rate and rhythm.  VASCULAR: No thrill within right arm fistula PULMONARY: Non-labored respirations MUSCULOSKELETAL: There are no major deformities or cyanosis. NEUROLOGIC: No focal weakness or paresthesias are detected. SKIN: There are no ulcers or rashes noted. PSYCHIATRIC: The patient has a normal affect.    MEDICAL ISSUES:   ESRD: Patient has an occluded right arm fistula.  He has had to have multiple interventions in the recent past to try to salvage this.  Based off the appearance of his vein, this was a marginal access.  It is now occluded.  I do not feel that it is salvageable.  I have recommended placing a catheter so that he can continue with dialysis and then plan for new access    Malvina New, IV, MD, FACS [facility] [phone] Pager [phone]

## 2023-10-04 ENCOUNTER — Encounter: Admitting: Neurology

## 2023-10-04 ENCOUNTER — Encounter: Payer: Self-pay | Admitting: Neurology

## 2023-10-07 ENCOUNTER — Telehealth: Payer: Self-pay

## 2023-10-07 NOTE — Telephone Encounter (Signed)
 Attempted to call for surgery scheduling. LVM

## 2023-10-08 NOTE — Therapy (Incomplete)
 OUTPATIENT PHYSICAL THERAPY CERVICAL EVALUATION   Patient Name: Ryan Jenkins MRN: [id number] DOB:1960-05-26, 63 y.o., male Today's Date: 10/08/2023  END OF SESSION:   Past Medical History:  Diagnosis Date   Anemia    Anginal pain    Anxiety    Arthritis    BPH with obstruction/lower urinary tract symptoms    CAD (coronary artery disease)    cardiologist--- dr marcello badal;  11-06-2019 cardiac cath in [place]  (result in care everywhere)  nonobstructive cad involing pRCA 60% (done in setting worseing CHF/ acute pulmonary edema requiring intubation/ AKI   Chronic combined systolic and diastolic CHF (congestive heart failure) (HCC)    Chronic kidney disease, stage IV (severe) (HCC)    nephrologist--- dr dolan   Depression    Diabetic neuropathy (HCC)    Dyspnea    Edema of both lower extremities    Gastric ulcer without hemorrhage or perforation 12/25/2018   GERD (gastroesophageal reflux disease)    Heart murmur    Hemodialysis patient    History of community acquired pneumonia    ad[facility]-04-2021 [facility]  w/ ARF hypoxia w/ severe sepsis   Hyperlipidemia    Hypertension    Insulin  dependent type 2 diabetes mellitus (HCC)    LBBB (left bundle branch block)    Pneumonia    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    Past Surgical History:  Procedure Laterality Date   A/V FISTULAGRAM Right 07/17/2022   Procedure: A/V Fistulagram;  Surgeon: Eliza Lonni RAMAN, MD;  Location[facility] INVASIVE CV LAB;  Service: Vascular;  Laterality: Right;   A/V FISTULAGRAM N/A 02/01/2023   Procedure: A/V Fistulagram;  Surgeon: Tobie Gordy POUR, MD;  Location: [facility] INVASIVE CV LAB;  Service: Cardiovascular;  Laterality: N/A;   A/V SHUNT INTERVENTION N/A 05/10/2023   Procedure: A/V SHUNT INTERVENTION;  Surgeon: Melia Lynwood ORN, MD;  Location: [facility] INVASIVE CV LAB;  Service: Cardiovascular;  Laterality: N/A;   A/V SHUNT  INTERVENTION N/A 06/21/2023   Procedure: A/V SHUNT INTERVENTION;  Surgeon: Serene Gaile ORN, MD;  Location: [facility] PV LAB;  Service: Cardiovascular;  Laterality: N/A;   AV FISTULA PLACEMENT Right 05/24/2022   Procedure: RADIOCEPHALIC ARTERIOVENOUS (AV) FISTULA CREATION;  Surgeon: Lanis Fonda BRAVO, MD;  Location: [facility] OR;  Service: Vascular;  Laterality: Right;   CARDIAC CATHETERIZATION  11/06/2019   [facility] in [place] ;    nonobstructive CAD , pRCA 60% (result in care everywhere)   CATARACT EXTRACTION W/ INTRAOCULAR LENS IMPLANT Bilateral 2017   DIALYSIS/PERMA CATHETER INSERTION N/A 10/03/2023   Procedure: DIALYSIS/PERMA CATHETER INSERTION;  Surgeon: Serene Gaile ORN, MD;  Location: [facility] LAB;  Service: Cardiovascular;  Laterality: N/A;   IR FLUORO GUIDE CV LINE RIGHT  05/23/2022   IR US  GUIDE VASC ACCESS RIGHT  05/23/2022   PATCH ANGIOPLASTY Right 09/18/2022   Procedure: BOVINE PATCH ANGIOPLASTY 1CM x 6CM;  Surgeon: Eliza Lonni RAMAN, MD;  Location: [facility] OR;  Service: Vascular;  Laterality: Right;   PERIPHERAL VASCULAR BALLOON ANGIOPLASTY Right 02/01/2023   Procedure: PERIPHERAL VASCULAR BALLOON ANGIOPLASTY;  Surgeon: Tobie Gordy POUR, MD;  Location: [facility] INVASIVE CV LAB;  Service: Cardiovascular;  Laterality: Right;  AA and Cphalic inflow vein   REVISON OF ARTERIOVENOUS FISTULA Right 09/18/2022   Procedure: REVISON OF RIGHT UPPER ARM ARTERIOVENOUS FISTULA;  Surgeon: Eliza Lonni RAMAN, MD;  Location: [facility] OR;  Service: Vascular;  Laterality:  Right;   TRANSURETHRAL RESECTION OF PROSTATE N/A 10/16/2021   Procedure: TRANSURETHRAL RESECTION OF THE PROSTATE (TURP);  Surgeon: Selma Donnice SAUNDERS, MD;  Location: [facility] ORS;  Service: Urology;  Laterality: N/A;   VENOUS ANGIOPLASTY Right 05/10/2023   Procedure: VENOUS ANGIOPLASTY;  Surgeon: Melia Lynwood ORN, MD;  Location: [facility] INVASIVE CV LAB;  Service: Cardiovascular;  Laterality: Right;  70% Cephalic Arch   Patient Active Problem List   Diagnosis Date  Noted   Hepatitis B immune 07/05/2023   Weakness generalized 07/03/2023   ESRD on dialysis (HCC) 07/03/2023   Chronic bilateral low back pain with right-sided sciatica 07/03/2023   History of ESBL Klebsiella pneumoniae infection 01/03/2023   Recurrent UTI 12/26/2022   Medication management 12/26/2022   Acute respiratory failure with hypoxia (HCC) 05/27/2022   AKI (acute kidney injury) 05/23/2022   CKD (chronic kidney disease) stage 5, GFR less than 15 ml/min (HCC) 04/24/2022   Diabetic gastroparesis (HCC) 04/09/2022   Chronic diastolic heart failure (HCC) 04/04/2022   Anemia in chronic kidney disease (CKD) 10/18/2021   BPH (benign prostatic hyperplasia) 10/16/2021   Coronary artery disease 02/07/2021   Erectile dysfunction associated with type 2 diabetes mellitus (HCC) 08/07/2019   Vitreous hemorrhage of left eye (HCC) 07/22/2019   Vision loss of left eye 07/22/2019   History of medication noncompliance 04/02/2019   Gastroesophageal reflux disease without esophagitis 09/04/2018   Diabetic retinopathy of both eyes associated with type 2 diabetes mellitus (HCC) 01/03/2018   Intermittent diarrhea 09/27/2017   Macroalbuminuric diabetic nephropathy (HCC) 06/25/2017   History of falling 06/25/2017   HLD (hyperlipidemia) 03/21/2017   Vitamin B 12 deficiency 03/21/2017   DM2 (diabetes mellitus, type 2) (HCC) 02/05/2017   Essential hypertension 02/05/2017   Depression 02/05/2017   Unintended weight loss 02/05/2017   Gait disturbance 02/05/2017   Pronation deformity of both feet 05/11/2014   Diabetic neuropathy, type II diabetes mellitus (HCC) 05/11/2014   Metatarsal deformity 05/11/2014    [facility] [address]   REFERRING PROVIDER: Gillie Duncans, MD   REFERRING DIAG: [phone] (ICD-10-CM) - Other spondylosis with radiculopathy, cervical region   THERAPY DIAG:  No diagnosis found.  Rationale for Evaluation and Treatment: Rehabilitation  ONSET DATE: ***  SUBJECTIVE:                                                                                                                                                                                                          SUBJECTIVE STATEMENT: ***  Hand dominance: {MISC; OT HAND [phone]}  PERTINENT HISTORY:  ***  PAIN:  Are you having pain? Yes: NPRS scale: *** Pain location: *** Pain description: *** Aggravating factors: *** Relieving factors: ***  PRECAUTIONS: {Therapy precautions:24002}  RED FLAGS: {PT Red Flags:29287}     WEIGHT BEARING RESTRICTIONS: {Yes ***/No:24003}  FALLS:  Has patient fallen in last 6 months? {fallsyesno:27318}  LIVING ENVIRONMENT: Lives with: {[facility] lives with:25569::lives with their family} Lives in: {Lives in:25570} Stairs: {opstairs:27293} Has following equipment at home: {Assistive devices:23999}  OCCUPATION: ***  PLOF: {PLOF:24004}  PATIENT GOALS: ***  NEXT MD VISIT: ***  OBJECTIVE:  Note: Objective measures were completed at Evaluation unless otherwise noted.  DIAGNOSTIC FINDINGS:  ***  PATIENT SURVEYS:  {rehab surveys:24030}  COGNITION: Overall cognitive status: {cognition:24006}  SENSATION: {sensation:27233}  POSTURE: {posture:25561}  PALPATION: ***   CERVICAL ROM:   {AROM/PROM:27142} ROM A/PROM (deg) eval  Flexion   Extension   Right lateral flexion   Left lateral flexion   Right rotation   Left rotation    (Blank rows = not tested)  UPPER EXTREMITY ROM:  {AROM/PROM:27142} ROM Right eval Left eval  Shoulder flexion    Shoulder extension    Shoulder abduction    Shoulder adduction    Shoulder extension    Shoulder internal rotation    Shoulder external rotation    Elbow flexion    Elbow extension    Wrist flexion    Wrist extension    Wrist ulnar deviation    Wrist radial deviation    Wrist pronation    Wrist supination     (Blank rows = not tested)  UPPER EXTREMITY MMT:  MMT Right eval Left eval   Shoulder flexion    Shoulder extension    Shoulder abduction    Shoulder adduction    Shoulder extension    Shoulder internal rotation    Shoulder external rotation    Middle trapezius    Lower trapezius    Elbow flexion    Elbow extension    Wrist flexion    Wrist extension    Wrist ulnar deviation    Wrist radial deviation    Wrist pronation    Wrist supination    Grip strength     (Blank rows = not tested)  CERVICAL SPECIAL TESTS:  {Cervical special tests:25246}  FUNCTIONAL TESTS:  {Functional tests:24029}  TREATMENT DATE:  [facility] Adult PT Treatment:                                                DATE: 10/09/23 Therapeutic Exercise: *** Manual Therapy: *** Neuromuscular re-ed: *** Therapeutic Activity: *** Modalities: *** Self Care: ***                                                                                                                                  PATIENT EDUCATION:  Education details: *** Person educated: {Person educated:25204} Education method: {Education Method:25205} Education comprehension: {Education Comprehension:25206}  HOME EXERCISE PROGRAM: ***  ASSESSMENT:  CLINICAL IMPRESSION: Patient is a 63 y.o. male who was seen today for physical therapy evaluation and treatment for M47.22 (ICD-10-CM) - Other spondylosis with radiculopathy, cervical region .   OBJECTIVE IMPAIRMENTS: {opptimpairments:25111}.   ACTIVITY LIMITATIONS: {activitylimitations:27494}  PARTICIPATION LIMITATIONS: {participationrestrictions:25113}  PERSONAL FACTORS: {Personal factors:25162} are also affecting patient's functional outcome.   REHAB POTENTIAL: {rehabpotential:25112}  CLINICAL DECISION MAKING: {clinical decision making:25114}  EVALUATION COMPLEXITY: {Evaluation complexity:25115}   GOALS: Goals reviewed with patient? {yes/no:20286}  SHORT TERM GOALS: Target date: ***  *** Baseline:  Goal status: INITIAL  2.  *** Baseline:  Goal  status: INITIAL  3.  *** Baseline:  Goal status: INITIAL  4.  *** Baseline:  Goal status: INITIAL  5.  *** Baseline:  Goal status: INITIAL  6.  *** Baseline:  Goal status: INITIAL  LONG TERM GOALS: Target date: ***  *** Baseline:  Goal status: INITIAL  2.  *** Baseline:  Goal status: INITIAL  3.  *** Baseline:  Goal status: INITIAL  4.  *** Baseline:  Goal status: INITIAL  5.  *** Baseline:  Goal status: INITIAL  6.  *** Baseline:  Goal status: INITIAL   PLAN:  PT FREQUENCY: {rehab frequency:25116}  PT DURATION: {rehab duration:25117}  PLANNED INTERVENTIONS: {rehab planned interventions:25118::97110-Therapeutic exercises,97530- Therapeutic [phone]- Neuromuscular re-education,97535- Self Rjmz,02859- Manual therapy}  PLAN FOR NEXT SESSION: ***   Merryl Buckels, PT 10/08/2023, 8:23 AM

## 2023-10-09 ENCOUNTER — Ambulatory Visit

## 2023-10-11 ENCOUNTER — Emergency Department (HOSPITAL_COMMUNITY): Admission: EM | Admit: 2023-10-11 | Discharge: 2023-10-11 | Disposition: A | Discharge status: Home / Self Care

## 2023-10-11 ENCOUNTER — Other Ambulatory Visit: Payer: Self-pay

## 2023-10-11 ENCOUNTER — Emergency Department (HOSPITAL_COMMUNITY)

## 2023-10-11 ENCOUNTER — Encounter (HOSPITAL_COMMUNITY): Payer: Self-pay

## 2023-10-11 DIAGNOSIS — M542 Cervicalgia: Secondary | ICD-10-CM | POA: Insufficient documentation

## 2023-10-11 DIAGNOSIS — R079 Chest pain, unspecified: Secondary | ICD-10-CM | POA: Diagnosis present

## 2023-10-11 DIAGNOSIS — Z91040 Latex allergy status: Secondary | ICD-10-CM | POA: Insufficient documentation

## 2023-10-11 DIAGNOSIS — Z992 Dependence on renal dialysis: Secondary | ICD-10-CM | POA: Insufficient documentation

## 2023-10-11 DIAGNOSIS — I2510 Atherosclerotic heart disease of native coronary artery without angina pectoris: Secondary | ICD-10-CM | POA: Diagnosis not present

## 2023-10-11 DIAGNOSIS — N186 End stage renal disease: Secondary | ICD-10-CM | POA: Diagnosis not present

## 2023-10-11 DIAGNOSIS — I700 Atherosclerosis of aorta: Secondary | ICD-10-CM | POA: Diagnosis not present

## 2023-10-11 DIAGNOSIS — Z79899 Other long term (current) drug therapy: Secondary | ICD-10-CM | POA: Insufficient documentation

## 2023-10-11 DIAGNOSIS — I120 Hypertensive chronic kidney disease with stage 5 chronic kidney disease or end stage renal disease: Secondary | ICD-10-CM | POA: Diagnosis not present

## 2023-10-11 DIAGNOSIS — R0602 Shortness of breath: Secondary | ICD-10-CM | POA: Diagnosis present

## 2023-10-11 LAB — CBC
HCT: 37.6 % — ABNORMAL LOW (ref 39.0–52.0)
Hemoglobin: 12.4 g/dL — ABNORMAL LOW (ref 13.0–17.0)
MCH: 32 pg (ref 26.0–34.0)
MCHC: 33 g/dL (ref 30.0–36.0)
MCV: 97.2 fL (ref 80.0–100.0)
Platelets: 266 K/uL (ref 150–400)
RBC: 3.87 MIL/uL — ABNORMAL LOW (ref 4.22–5.81)
RDW: 14.4 % (ref 11.5–15.5)
WBC: 6.5 K/uL (ref 4.0–10.5)
nRBC: 0 % (ref 0.0–0.2)

## 2023-10-11 LAB — TROPONIN I (HIGH SENSITIVITY)
Troponin I (High Sensitivity): 30 ng/L — ABNORMAL HIGH (ref <18)
Troponin I (High Sensitivity): 32 ng/L — ABNORMAL HIGH (ref <18)

## 2023-10-11 LAB — BASIC METABOLIC PANEL WITH GFR
Anion gap: 17 — ABNORMAL HIGH (ref 5–15)
BUN: 19 mg/dL (ref 8–23)
CO2: 21 mmol/L — ABNORMAL LOW (ref 22–32)
Calcium: 9 mg/dL (ref 8.9–10.3)
Chloride: 98 mmol/L (ref 98–111)
Creatinine, Ser: 5.29 mg/dL — ABNORMAL HIGH (ref 0.61–1.24)
GFR, Estimated: 11 mL/min — ABNORMAL LOW (ref >60)
Glucose, Bld: 207 mg/dL — ABNORMAL HIGH (ref 70–99)
Potassium: 4.3 mmol/L (ref 3.5–5.1)
Sodium: 136 mmol/L (ref 135–145)

## 2023-10-11 LAB — BRAIN NATRIURETIC PEPTIDE: B Natriuretic Peptide: 282.4 pg/mL — ABNORMAL HIGH (ref 0.0–100.0)

## 2023-10-11 MED ORDER — ONDANSETRON HCL 4 MG/2ML IJ SOLN
4.0000 mg | Freq: Once | INTRAMUSCULAR | Status: AC
  Administered 2023-10-11: 4 mg via INTRAVENOUS
  Filled 2023-10-11: qty 2

## 2023-10-11 MED ORDER — LIDOCAINE 5 % EX PTCH: 1.0000 | MEDICATED_PATCH | CUTANEOUS | 0 refills | Status: AC

## 2023-10-11 MED ORDER — IOHEXOL 350 MG/ML SOLN
75.0000 mL | Freq: Once | INTRAVENOUS | Status: AC | PRN
  Administered 2023-10-11: 75 mL via INTRAVENOUS

## 2023-10-11 MED ORDER — OXYCODONE-ACETAMINOPHEN 5-325 MG PO TABS: 1.0000 | ORAL_TABLET | Freq: Four times a day (QID) | ORAL | 0 refills | Status: DC | PRN

## 2023-10-11 MED ORDER — TIZANIDINE HCL 2 MG PO TABS: 2.0000 mg | ORAL_TABLET | Freq: Every day | ORAL | 0 refills | Status: AC

## 2023-10-11 MED ORDER — MORPHINE SULFATE (PF) 4 MG/ML IV SOLN
4.0000 mg | Freq: Once | INTRAVENOUS | Status: AC
  Administered 2023-10-11: 4 mg via INTRAVENOUS
  Filled 2023-10-11: qty 1

## 2023-10-11 MED ORDER — LABETALOL HCL 5 MG/ML IV SOLN
10.0000 mg | Freq: Once | INTRAVENOUS | Status: AC
  Administered 2023-10-11: 10 mg via INTRAVENOUS
  Filled 2023-10-11: qty 4

## 2023-10-11 NOTE — ED Provider Notes (Signed)
 [place] EMERGENCY DEPARTMENT AT [facility] Provider Note   CSN: [id number] Arrival date & time: 10/11/23  1053     Patient presents with: Chest Pain and Hypertension   Ryan Jenkins is a 63 y.o. male.   63 year old male with past medical history of hypertension and end-stage renal disease on dialysis Tuesdays, Thursdays, and Saturdays presenting to the emergency department today with chest pain.  The patient states that he has been having intermittent chest pain on and off now for the past week or so.  He states the pain is in the center of his chest and also in his back from his neck down to his mid back.  The pain is nonpleuritic.  The patient states that this particular episode of pain started last night and has been worsening.  He has had some nausea but denies any vomiting.  Pain is not exertional.  He states that his last dialysis was yesterday and he denies any missed dialysis sessions.   Chest Pain Hypertension Associated symptoms include chest pain.       Prior to Admission medications   Medication Sig Start Date End Date Taking? Authorizing Provider  lidocaine  (LIDODERM ) 5 % Place 1 patch onto the skin daily. Remove & Discard patch within 12 hours or as directed by MD 10/11/23  Yes Ula Prentice JONELLE, MD  oxyCODONE -acetaminophen  (PERCOCET/ROXICET) 5-325 MG tablet Take 1 tablet by mouth every 6 (six) hours as needed for severe pain (pain score 7-10). 10/11/23  Yes Ula Prentice JONELLE, MD  tiZANidine  (ZANAFLEX ) 2 MG tablet Take 1 tablet (2 mg total) by mouth daily. 10/11/23  Yes Ula Prentice JONELLE, MD  acetaminophen  (TYLENOL ) 650 MG CR tablet Take 650 mg by mouth every 8 (eight) hours as needed for pain.    [provider]  amLODipine  (NORVASC ) 5 MG tablet Take 5 mg by mouth See admin instructions. Take 1 tablet (5mg ) by mouth once daily - on Tuesday, Thursday and Saturday take 1 tablet in the afternoon, after dialysis. On non-dialysis days, take 1 tablet every morning.  11/22/22   [provider]  BELSOMRA 5 MG TABS Take 1 tablet by mouth at bedtime. 04/29/23   [provider]  calcium  acetate (PHOSLO) 667 MG capsule Take 1,334 mg by mouth 3 (three) times daily with meals.    [provider]  famotidine  (PEPCID ) 40 MG tablet Take 40 mg by mouth daily in the afternoon.    [provider]  furosemide  (LASIX ) 40 MG tablet Take 40 mg by mouth See admin instructions. Take 1 tablet (40mg ) by mouth daily. On Tuesday, Thursday and Saturday take 1 tablet in the afternoon, after dialysis. On non-dialysis days, take 1 tablet every morning. 05/24/22   [provider]  hydrALAZINE  (APRESOLINE ) 100 MG tablet Take 100 mg by mouth 3 (three) times daily.    [provider]  LANTUS  SOLOSTAR 100 UNIT/ML Solostar Pen Inject 5 Units into the skin at bedtime. Patient taking differently: Inject 15 Units into the skin at bedtime. 03/29/22   Arlice Reichert, MD  meclizine  (ANTIVERT ) 25 MG tablet Take 25 mg by mouth at bedtime. 01/10/23   [provider]  metoprolol  succinate (TOPROL -XL) 25 MG 24 hr tablet Take 0.5 tablets (12.5 mg total) by mouth daily. Patient taking differently: Take 12.5 mg by mouth See admin instructions. Take 1/2 tablet (12.5mg ) by mouth daily. On Tuesday, Thursday and Saturday take 1/2 tablet in the afternoon, after dialysis. On non-dialysis days take 1/2 tablet  every morning. 03/30/22 08/19/24  Arlice Reichert, MD  multivitamin (RENA-VIT) TABS tablet Take 1 tablet by mouth daily in the afternoon.    [provider]  pioglitazone (ACTOS) 15 MG tablet Take 15 mg by mouth daily. 10/25/22   [provider]  polyethylene glycol (MIRALAX  / GLYCOLAX ) 17 g packet Take 17 g by mouth daily as needed (constipation).    [provider]  pregabalin (LYRICA) 50 MG capsule Take 50 mg by mouth daily.    [provider]  rosuvastatin  (CRESTOR ) 10 MG tablet Take 10 mg by mouth at bedtime.     [provider]  sevelamer  carbonate (RENVELA ) 800 MG tablet Take 1,600 mg by mouth 3 (three) times daily with meals.    [provider]  tamsulosin  (FLOMAX ) 0.4 MG CAPS capsule Take 0.8 mg by mouth daily.    [provider]    Allergies: Claritin [loratadine], Latex, Hydrochlorothiazide , Lyrica [pregabalin], and Metformin and related    Review of Systems  Cardiovascular:  Positive for chest pain.  All other systems reviewed and are negative.   Updated Vital Signs BP (!) 158/81   Pulse 79   Temp 98.3 F (36.8 C) (Oral)   Resp 18   Ht 6' 2 (1.88 m)   Wt 95.3 kg   SpO2 98%   BMI 26.96 kg/m   Physical Exam Vitals and nursing note reviewed.   Gen: NAD Eyes: PERRL, EOMI HEENT: no oropharyngeal swelling Neck: trachea midline Resp: clear to auscultation bilaterally Card: RRR, no murmurs, rubs, or gallops Abd: nontender, nondistended Extremities: no calf tenderness, no edema Vascular: 2+ radial pulses bilaterally, 2+ DP pulses bilaterally Neuro: no focal deficits Skin: no rashes Psyc: acting appropriately   (all labs ordered are listed, but only abnormal results are displayed) Labs Reviewed  BASIC METABOLIC PANEL WITH GFR - Abnormal; Notable for the following components:      Result Value   CO2 21 (*)    Glucose, Bld 207 (*)    Creatinine, Ser 5.29 (*)    GFR, Estimated 11 (*)    Anion gap 17 (*)    All other components within normal limits  CBC - Abnormal; Notable for the following components:   RBC 3.87 (*)    Hemoglobin 12.4 (*)    HCT 37.6 (*)    All other components within normal limits  BRAIN NATRIURETIC PEPTIDE - Abnormal; Notable for the following components:   B Natriuretic Peptide 282.4 (*)    All other components within normal limits  TROPONIN I (HIGH SENSITIVITY) - Abnormal; Notable for the following components:   Troponin I (High Sensitivity) 30 (*)    All other components within normal limits  TROPONIN I (HIGH  SENSITIVITY) - Abnormal; Notable for the following components:   Troponin I (High Sensitivity) 32 (*)    All other components within normal limits    EKG: EKG Interpretation Date/Time:  Friday October 11 2023 10:59:18 EDT Ventricular Rate:  92 PR Interval:  155 QRS Duration:  108 QT Interval:  361 QTC Calculation: 447 R Axis:   -9  Text Interpretation: Sinus rhythm LVH with secondary repolarization abnormality Anterior Q waves, possibly due to LVH Confirmed by Ula Barter [id number]) on 10/11/2023 11:12:48 AM  Radiology: CT Angio Chest Aorta W and/or Wo Contrast Result Date: 10/11/2023 CLINICAL DATA:  Upper neck pain radiating to left shoulder, hypertension, headache EXAM: CT ANGIOGRAPHY CHEST WITH CONTRAST TECHNIQUE: Multidetector CT imaging of the chest was performed using the standard protocol  during bolus administration of intravenous contrast. Multiplanar CT image reconstructions and MIPs were obtained to evaluate the vascular anatomy. RADIATION DOSE REDUCTION: This exam was performed according to the departmental dose-optimization program which includes automated exposure control, adjustment of the mA and/or kV according to patient size and/or use of iterative reconstruction technique. CONTRAST:  75mL OMNIPAQUE  IOHEXOL  350 MG/ML SOLN COMPARISON:  05/27/2022, 10/11/2023 FINDINGS: Cardiovascular: No evidence of thoracic aortic aneurysm or dissection. Minimal atherosclerosis of the aortic arch. Great vessels are widely patent. There is technically adequate opacification of the pulmonary vasculature. No filling defects or pulmonary emboli. The heart is normal in size. Trace pericardial fluid without significant pericardial effusion. Stable coronary artery atherosclerosis. Right internal jugular catheter tip within the superior vena cava. Mediastinum/Nodes: No enlarged mediastinal, hilar, or axillary lymph nodes. Thyroid  gland, trachea, and esophagus demonstrate no significant findings.  Lungs/Pleura: No acute airspace disease, effusion, or pneumothorax. Central airways are patent. Upper Abdomen: No acute abnormality. Musculoskeletal: No acute or destructive bony abnormalities. Reconstructed images demonstrate no additional findings. Review of the MIP images confirms the above findings. IMPRESSION: 1. No evidence of thoracic aortic aneurysm or dissection. 2. No evidence of pulmonary embolus. 3. No acute intrathoracic process. 4. Aortic Atherosclerosis (ICD10-I70.0). Coronary artery atherosclerosis. Electronically Signed   By: Ozell Daring M.D.   On: 10/11/2023 14:25   DG Chest 2 View Result Date: 10/11/2023 EXAM: 2 VIEW(S) XRAY OF THE CHEST 10/11/2023 11:29:00 AM COMPARISON: 07/02/2023 CLINICAL HISTORY: CP. CP; hypertension; Per triage notes: Pt here for upper neck pain that radiates down to left shoulder from spinal stenosis. Pt hypertensive in 220's systolic, hx of same and non compliant with medication. C/O HA. Axox4. Pt full dialysis tx was yesterday. C/O [facility] and ; denies CP right now. ; Pt states he now has chest pain across his upper chest FINDINGS: LINES, TUBES AND DEVICES: Right chest tunneled dialysis catheter, tip at distal SVC. LUNGS AND PLEURA: No focal pulmonary opacity. No pulmonary edema. No pleural effusion. No pneumothorax. HEART AND MEDIASTINUM: No acute abnormality of the cardiac and mediastinal silhouettes. BONES AND SOFT TISSUES: No acute osseous abnormality. IMPRESSION: 1. No acute cardiopulmonary process. 2. Right chest tunneled dialysis catheter with tip in the distal SVC, in expected position. Electronically signed by: Waddell Calk MD 10/11/2023 11:54 AM EDT RP Workstation: [id number]     Procedures   Medications Ordered in the ED  labetalol  (NORMODYNE ) injection 10 mg (10 mg Intravenous Given 10/11/23 1132)  morphine  (PF) 4 MG/ML injection 4 mg (4 mg Intravenous Given 10/11/23 1131)  ondansetron  (ZOFRAN ) injection 4 mg (4 mg Intravenous Given 10/11/23 1130)   iohexol  (OMNIPAQUE ) 350 MG/ML injection 75 mL (75 mLs Intravenous Contrast Given 10/11/23 1322)                                    Medical Decision Making 63 year old male with past medical history of hypertension and end-stage renal disease on dialysis presenting to the emergency department today with chest pain.  I will further evaluate the patient here with basic labs Wels and EKG, chest x-ray, and troponin to further evaluate for ACS, pulmonary edema, pulmonary infiltrates, pneumothorax.  With reassuring heart rate and pulse ox suspicion for pulmonary embolism is low at this time especially given the waxing and waning nature of this.  Also feel that aortic dissection is less likely given the fact that the pain has been waxing and waning in intensity.  I will further evaluate the patient here with the above workup and give morphine  and Zofran  for symptoms.  Will give him labetalol  here as well for his blood pressure.  I will reevaluate for ultimate disposition.  The patient's EKG interpreted by me does show a sinus rhythm with LVH as well as some J-point elevation in the anterior leads.  This does appear relatively similar in morphology to his previous EKGs.  He does have some T wave inversions in the lateral leads that do appear to be slightly more pronounced than his previous EKGs.  The patient had 2 troponins here that are negative.  He is complaining more now of neck pain.  I did order a CT angiogram prior to this complaint and does not show any findings consistent with dissection or other acute findings.  The patient is stable here and his blood pressure is improved.  I think that he is stable for outpatient management for likely musculoskeletal thoracic wall pain.  Amount and/or Complexity of Data Reviewed Labs: ordered. Radiology: ordered.  Risk Prescription drug management.        Final diagnoses:  Nonspecific chest pain    ED Discharge Orders          Ordered     tiZANidine  (ZANAFLEX ) 2 MG tablet  Daily        10/11/23 1502    oxyCODONE -acetaminophen  (PERCOCET/ROXICET) 5-325 MG tablet  Every 6 hours PRN        10/11/23 1502    lidocaine  (LIDODERM ) 5 %  Every 24 hours        10/11/23 1502               Ula Prentice SAUNDERS, MD 10/11/23 1504

## 2023-10-11 NOTE — Discharge Instructions (Signed)
 Your workup today was reassuring.  Please stop the codeine  medication as well as your methocarbamol .  Try the Zanaflex  once daily.  You may also try the Percocet as needed.  This is more of a short-term medication send follow-up with your doctor.  Try the lighted Derm patches on your neck.  Return to the ER for worsening symptoms.

## 2023-10-11 NOTE — ED Notes (Signed)
 Patient discharged to home with family member. Patient given written and oral discharge instructions. Patient verbalized understanding. Patient taken to private car via wheelchair by ER tech. Patient breathing without difficulty. Patient denies questions, concerns or needs at this time.

## 2023-10-11 NOTE — ED Triage Notes (Signed)
 Pt here for upper neck pain that radiates down to left shoulder from spinal stenosis.  Pt hypertensive in 220's systolic, hx of same and non compliant with medication. C/O HA. Axox4. Pt full dialysis tx was yesterday. C/O [facility] and denies CP right now.

## 2023-10-11 NOTE — ED Notes (Signed)
 Patient transported to CT

## 2023-10-15 ENCOUNTER — Ambulatory Visit: Attending: Cardiovascular Disease | Admitting: Cardiovascular Disease

## 2023-10-15 VITALS — BP 170/70 | HR 84 | Ht 74.0 in | Wt 223.0 lb

## 2023-10-15 DIAGNOSIS — E785 Hyperlipidemia, unspecified: Secondary | ICD-10-CM

## 2023-10-15 DIAGNOSIS — E1122 Type 2 diabetes mellitus with diabetic chronic kidney disease: Secondary | ICD-10-CM

## 2023-10-15 DIAGNOSIS — I5032 Chronic diastolic (congestive) heart failure: Secondary | ICD-10-CM

## 2023-10-15 DIAGNOSIS — Z992 Dependence on renal dialysis: Secondary | ICD-10-CM

## 2023-10-15 DIAGNOSIS — I10 Essential (primary) hypertension: Secondary | ICD-10-CM | POA: Diagnosis not present

## 2023-10-15 DIAGNOSIS — N186 End stage renal disease: Secondary | ICD-10-CM | POA: Diagnosis not present

## 2023-10-15 DIAGNOSIS — Z794 Long term (current) use of insulin: Secondary | ICD-10-CM

## 2023-10-15 MED ORDER — CARVEDILOL 6.25 MG PO TABS: 6.2500 mg | ORAL_TABLET | Freq: Two times a day (BID) | ORAL | 3 refills | Status: AC

## 2023-10-15 MED ORDER — AMLODIPINE BESYLATE 5 MG PO TABS: ORAL_TABLET | ORAL | 3 refills | Status: AC

## 2023-10-15 NOTE — Patient Instructions (Addendum)
 Medication Instructions:  Stop Hydralazine  Stop Metoprolol  Start Carvedilol  6.25 mg twice daily On Non-Dialysis days- Take Amlodipine  10 mg On Dialysis Days- After Dialysis- Take Amlodipine  5 mg Keep a log of your BP and let us  know if it is too high or too low.  *If you need a refill on your cardiac medications before your next appointment, please call your pharmacy*  Lab Work: None ordered If you have labs (blood work) drawn today and your tests are completely normal, you will receive your results only by: MyChart Message (if you have MyChart) OR A paper copy in the mail If you have any lab test that is abnormal or we need to change your treatment, we will call you to review the results.  Testing/Procedures: None ordered  Follow-Up: At [facility], you and your health needs are our priority.  As part of our continuing mission to provide you with exceptional heart care, our providers are all part of one team.  This team includes your primary Cardiologist (physician) and Advanced Practice Providers or APPs (Physician Assistants and Nurse Practitioners) who all work together to provide you with the care you need, when you need it.  Your next appointment:    1 month with APP   We recommend signing up for the patient portal called MyChart.  Sign up information is provided on this After Visit Summary.  MyChart is used to connect with patients for Virtual Visits (Telemedicine).  Patients are able to view lab/test results, encounter notes, upcoming appointments, etc.  Non-urgent messages can be sent to your provider as well.   To learn more about what you can do with MyChart, go to [email or web address].

## 2023-10-21 ENCOUNTER — Ambulatory Visit

## 2023-10-21 NOTE — Therapy (Deleted)
 OUTPATIENT PHYSICAL THERAPY CERVICAL EVALUATION   Patient Name: Ryan Jenkins MRN: [id number] DOB:06-Jan-1961, 63 y.o., male Today's Date: 10/21/2023  END OF SESSION:   Past Medical History:  Diagnosis Date   Anemia    Anginal pain    Anxiety    Arthritis    BPH with obstruction/lower urinary tract symptoms    CAD (coronary artery disease)    cardiologist--- dr marcello badal;  11-06-2019 cardiac cath in [place]  (result in care everywhere)  nonobstructive cad involing pRCA 60% (done in setting worseing CHF/ acute pulmonary edema requiring intubation/ AKI   Chronic combined systolic and diastolic CHF (congestive heart failure) (HCC)    Chronic kidney disease, stage IV (severe) (HCC)    nephrologist--- dr dolan   Depression    Diabetic neuropathy (HCC)    Dyspnea    Edema of both lower extremities    Gastric ulcer without hemorrhage or perforation 12/25/2018   GERD (gastroesophageal reflux disease)    Heart murmur    Hemodialysis patient    History of community acquired pneumonia    admission 06-04-2021 [facility]  w/ ARF hypoxia w/ severe sepsis   Hyperlipidemia    Hypertension    Insulin  dependent type 2 diabetes mellitus (HCC)    LBBB (left bundle branch block)    Pneumonia    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)    Uses walker    Vitamin B12 deficiency    Vitreous hemorrhage [facility])    Past Surgical History:  Procedure Laterality Date   A/V FISTULAGRAM Right 07/17/2022   Procedure: A/V Fistulagram;  Surgeon: Eliza Lonni RAMAN, MD;  Location: [facility] INVASIVE CV LAB;  Service: Vascular;  Laterality: Right;   A/V FISTULAGRAM N/A 02/01/2023   Procedure: A/V Fistulagram;  Surgeon: Tobie Gordy POUR, MD;  Location: [facility] INVASIVE CV LAB;  Service: Cardiovascular;  Laterality: N/A;   A/V SHUNT INTERVENTION N/A 05/10/2023   Procedure: A/V SHUNT INTERVENTION;  Surgeon: Melia Lynwood ORN, MD;  Location: [facility] INVASIVE CV LAB;  Service: Cardiovascular;  Laterality: N/A;   A/V SHUNT  INTERVENTION N/A 06/21/2023   Procedure: A/V SHUNT INTERVENTION;  Surgeon: Serene Gaile ORN, MD;  Location: [facility] PV LAB;  Service: Cardiovascular;  Laterality: N/A;   AV FISTULA PLACEMENT Right 05/24/2022   Procedure: RADIOCEPHALIC ARTERIOVENOUS (AV) FISTULA CREATION;  Surgeon: Lanis Fonda BRAVO, MD;  Location: [facility] OR;  Service: Vascular;  Laterality: Right;   CARDIAC CATHETERIZATION  11/06/2019   [facility] in [place] ;    nonobstructive CAD , pRCA 60% (result in care everywhere)   CATARACT EXTRACTION W/ INTRAOCULAR LENS IMPLANT Bilateral 2017   DIALYSIS/PERMA CATHETER INSERTION N/A 10/03/2023   Procedure: DIALYSIS/PERMA CATHETER INSERTION;  Surgeon: Serene Gaile ORN, MD;  Location: [facility];  Service: Cardiovascular;  Laterality: N/A;   IR FLUORO GUIDE CV LINE RIGHT  05/23/2022   IR US  GUIDE VASC ACCESS RIGHT  05/23/2022   PATCH ANGIOPLASTY Right 09/18/2022   Procedure: BOVINE PATCH ANGIOPLASTY 1CM x 6CM;  Surgeon: Eliza Lonni RAMAN, MD;  Location: [facility] OR;  Service: Vascular;  Laterality: Right;   PERIPHERAL VASCULAR BALLOON ANGIOPLASTY Right 02/01/2023   Procedure: PERIPHERAL VASCULAR BALLOON ANGIOPLASTY;  Surgeon: Tobie Gordy POUR, MD;  Location: [facility] INVASIVE CV LAB;  Service: Cardiovascular;  Laterality: Right;  AA and Cphalic inflow vein   REVISON OF ARTERIOVENOUS FISTULA Right 09/18/2022   Procedure: REVISON OF RIGHT UPPER ARM ARTERIOVENOUS FISTULA;  Surgeon: Eliza Lonni RAMAN, MD;  Location: [facility] OR;  Service: Vascular;  Laterality:  Right;   TRANSURETHRAL RESECTION OF PROSTATE N/A 10/16/2021   Procedure: TRANSURETHRAL RESECTION OF THE PROSTATE (TURP);  Surgeon: Selma Donnice SAUNDERS, MD;  Location: [facility] ORS;  Service: Urology;  Laterality: N/A;   VENOUS ANGIOPLASTY Right 05/10/2023   Procedure: VENOUS ANGIOPLASTY;  Surgeon: Melia Lynwood ORN, MD;  Location: [facility] INVASIVE CV LAB;  Service: Cardiovascular;  Laterality: Right;  70% Cephalic Arch   Patient Active Problem List   Diagnosis Date  Noted   Hepatitis B immune 07/05/2023   Weakness generalized 07/03/2023   ESRD on dialysis (HCC) 07/03/2023   Chronic bilateral low back pain with right-sided sciatica 07/03/2023   History of ESBL Klebsiella pneumoniae infection 01/03/2023   Recurrent UTI 12/26/2022   Medication management 12/26/2022   Acute respiratory failure with hypoxia (HCC) 05/27/2022   AKI (acute kidney injury) 05/23/2022   CKD (chronic kidney disease) stage 5, GFR less than 15 ml/min (HCC) 04/24/2022   Diabetic gastroparesis (HCC) 04/09/2022   Chronic diastolic heart failure (HCC) 04/04/2022   Anemia in chronic kidney disease (CKD) 10/18/2021   BPH (benign prostatic hyperplasia) 10/16/2021   Coronary artery disease 02/07/2021   Erectile dysfunction associated with type 2 diabetes mellitus (HCC) 08/07/2019   Vitreous hemorrhage of left eye (HCC) 07/22/2019   Vision loss of left eye 07/22/2019   History of medication noncompliance 04/02/2019   Gastroesophageal reflux disease without esophagitis 09/04/2018   Diabetic retinopathy of both eyes associated with type 2 diabetes mellitus (HCC) 01/03/2018   Intermittent diarrhea 09/27/2017   Macroalbuminuric diabetic nephropathy (HCC) 06/25/2017   History of falling 06/25/2017   HLD (hyperlipidemia) 03/21/2017   Vitamin B 12 deficiency 03/21/2017   DM2 (diabetes mellitus, type 2) (HCC) 02/05/2017   Essential hypertension 02/05/2017   Depression 02/05/2017   Unintended weight loss 02/05/2017   Gait disturbance 02/05/2017   Pronation deformity of both feet 05/11/2014   Diabetic neuropathy, type II diabetes mellitus (HCC) 05/11/2014   Metatarsal deformity 05/11/2014    [facility] [address]   REFERRING PROVIDER: Gillie Duncans, MD  REFERRING DIAG:  Diagnosis  M47.22 (ICD-10-CM) - Other spondylosis with radiculopathy, cervical region    THERAPY DIAG:  No diagnosis found.  Rationale for Evaluation and Treatment: Rehabilitation  ONSET DATE:  ***  SUBJECTIVE:                                                                                                                                                                                                         SUBJECTIVE STATEMENT: *** Hand dominance: {MISC; OT HAND DOMINANCE:[id number]}  PERTINENT HISTORY:  PAIN:  Are you having pain? Yes: NPRS scale: *** Pain location: *** Pain description: *** Aggravating factors: *** Relieving factors: ***  PRECAUTIONS: Fall  RED FLAGS: {PT Red Flags:29287}     WEIGHT BEARING RESTRICTIONS: No  FALLS:  Has patient fallen in last 6 months? Yes. Number of falls ***  OCCUPATION: not working  PLOF: Independent with basic ADLs  PATIENT GOALS: To manage my symptoms  NEXT MD VISIT: TBD  OBJECTIVE:  Note: Objective measures were completed at Evaluation unless otherwise noted.  DIAGNOSTIC FINDINGS:  Cervical stenosis  PATIENT SURVEYS:  NDI:   Minimum Detectable Change (90% confidence): 5 points or 10% points   POSTURE: {posture:25561}  PALPATION: ***   CERVICAL ROM:   {AROM/PROM:27142} ROM A/PROM (deg) eval  Flexion   Extension   Right lateral flexion   Left lateral flexion   Right rotation   Left rotation    (Blank rows = not tested)  UPPER EXTREMITY ROM:  {AROM/PROM:27142} ROM Right eval Left eval  Shoulder flexion    Shoulder extension    Shoulder abduction    Shoulder adduction    Shoulder extension    Shoulder internal rotation    Shoulder external rotation    Elbow flexion    Elbow extension    Wrist flexion    Wrist extension    Wrist ulnar deviation    Wrist radial deviation    Wrist pronation    Wrist supination     (Blank rows = not tested)  UPPER EXTREMITY MMT:  MMT Right eval Left eval  Shoulder flexion    Shoulder extension    Shoulder abduction    Shoulder adduction    Shoulder extension    Shoulder internal rotation    Shoulder external rotation    Middle  trapezius    Lower trapezius    Elbow flexion    Elbow extension    Wrist flexion    Wrist extension    Wrist ulnar deviation    Wrist radial deviation    Wrist pronation    Wrist supination    Grip strength     (Blank rows = not tested)  CERVICAL SPECIAL TESTS:  {Cervical special tests:25246}  FUNCTIONAL TESTS:  {Functional tests:24029}  TREATMENT:                                                                                                                           [facility] Adult PT Treatment:                                                DATE: *** Eval and HEP Self Care: Additional minutes spent for educating on updated Therapeutic Home Exercise Program as well as comparing current status to condition at start of symptoms. This included exercises focusing on stretching, strengthening, with focus on eccentric  aspects. Long term goals include an improvement in range of motion, strength, endurance as well as avoiding reinjury. Patient's frequency would include in 1-2 times a day, 3-5 times a week for a duration of 6-12 weeks. Proper technique shown and discussed handout in great detail. All questions were discussed and addressed.      PATIENT EDUCATION:  Education details: Discussed eval findings, rehab rationale and POC and patient is in agreement  Person educated: Patient Education method: Explanation and Handouts Education comprehension: verbalized understanding and needs further education  HOME EXERCISE PROGRAM: ***  ASSESSMENT:  CLINICAL IMPRESSION: Patient is a 63 y.o. male  who was seen today for physical therapy evaluation and treatment for ***.   OBJECTIVE IMPAIRMENTS: {opptimpairments:25111}.   ACTIVITY LIMITATIONS: {activitylimitations:27494}  PERSONAL FACTORS: {Personal factors:25162} are also affecting patient's functional outcome.   REHAB POTENTIAL: Poor ***  CLINICAL DECISION MAKING: Evolving/moderate complexity  EVALUATION COMPLEXITY:  High   GOALS: Goals reviewed with patient? No  SHORT TERM GOALS: Target date: ***  *** Baseline:  Goal status: INITIAL  2.  *** Baseline:  Goal status: INITIAL  3.  *** Baseline:  Goal status: INITIAL  4.  *** Baseline:  Goal status: INITIAL  5.  *** Baseline:  Goal status: INITIAL  6.  *** Baseline:  Goal status: INITIAL  LONG TERM GOALS: Target date: ***  *** Baseline:  Goal status: INITIAL  2.  *** Baseline:  Goal status: INITIAL  3.  *** Baseline:  Goal status: INITIAL  4.  *** Baseline:  Goal status: INITIAL  5.  *** Baseline:  Goal status: INITIAL  6.  *** Baseline:  Goal status: INITIAL   PLAN:  PT FREQUENCY: 1-2x/week  PT DURATION: 6 weeks  PLANNED INTERVENTIONS: 97110-Therapeutic exercises, 97530- Therapeutic activity, 97112- Neuromuscular re-education, 97535- Self Care, 02859- Manual therapy, [phone]- Gait training, and Patient/Family education  PLAN FOR NEXT SESSION: HEP review and update, manual techniques as appropriate, aerobic tasks, ROM and flexibility activities, strengthening and PREs, TPDN, gait and balance training as needed     Reyes CHRISTELLA Kohut, PT 10/21/2023, 8:00 AM

## 2023-10-22 NOTE — Progress Notes (Signed)
 Cardiology Office Note:    Date:  10/22/2023   ID:  Ryan Jenkins, DOB [birth year], MRN [id number]  PCP:  [facility]   [facility] Providers Cardiologist:  None     Referring MD: Gretta Lonni PARAS, MD   Chief Complaint  Patient presents with   Hypertension    History of Present Illness:    Ryan Jenkins is a 63 y.o. male with a hx of chronic combined systolic and diastolic heart failure, type 2 diabetes mellitus (complicated by ESRD and chronic hemodialysis, retinopathy, gastroparesis, neuropathy), HTN, aortic atherosclerosis, dyslipidemia, aortic atherosclerosis, history of moderate atherosclerosis without obstructive lesions (cardiac catheterization 2021 with 60% proximal RCA), LBBB, referred for consolidation of cardiac care.  He was last seen by cardiology during the hospitalization in October 2023 for acute heart failure exacerbation.  He generally does well with hemodialysis, but pretty much every session at the end of dialysis developed hypotension and has to be placed in Trendelenburg.  He has been on dialysis now for about the last 6 months, Tuesday/Thursday/Saturday schedule.  His nephrologist is Dr. Dolan.  His current antihypertensive regimen consists of hydralazine  100 mg 3 times daily, but his sleep schedule is unpredictable and he mode more often than not misses 1 or 2 doses of the hydralazine .  He is also on metoprolol .  He has had repeated problems with his dialysis access.  He has a right upper arm fistula that has required percutaneous intervention repeatedly.  He most recently was found to have nonsalvageable occlusion of that fistula after an evaluation with Dr. Serene just 2 weeks ago.  He now has a temporary tunneled catheter for dialysis.  He has never had complaints of chest pain.  He had cardiac catheterization in 2021 in [place]  where he was found to have only a 60% stenosis in the right coronary artery, without any obstructive  lesions.  Metabolic control is good, but his last lipid profile was performed more than a year ago in April 2024: Total cholesterol 122, HDL 44, LDL 59, triglycerides 105.  Glycemic control on the other hand is very poor with a recent hemoglobin A1c of 10.6%.  He is not anemic.    Past Medical History:  Diagnosis Date   Anemia    Anginal pain    Anxiety    Arthritis    BPH with obstruction/lower urinary tract symptoms    CAD (coronary artery disease)    cardiologist--- dr marcello badal;  11-06-2019 cardiac cath in [place]  (result in care everywhere)  nonobstructive cad involing pRCA 60% (done in setting worseing CHF/ acute pulmonary edema requiring intubation/ AKI   Chronic combined systolic and diastolic CHF (congestive heart failure) (HCC)    Chronic kidney disease, stage IV (severe) (HCC)    nephrologist--- dr dolan   Depression    Diabetic neuropathy (HCC)    Dyspnea    Edema of both lower extremities    Gastric ulcer without hemorrhage or perforation 12/25/2018   GERD (gastroesophageal reflux disease)    Heart murmur    Hemodialysis patient    History of community acquired pneumonia    ad[facility]-04-20[facility] [place] [facility]  w/ ARF hypoxia w/ severe sepsis   Hyperlipidemia    Hypertension    Insulin  dependent type 2 diabetes mellitus (HCC)    LBBB (left bundle branch block)    Pneumonia    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)    Uses walker    Vitamin B12 deficiency  Vitreous hemorrhage [facility])     Past Surgical History:  Procedure Laterality Date   A/V FISTULAGRAM Right 07/17/2022   Procedure: A/V Fistulagram;  Surgeon: Eliza Lonni RAMAN, MD;  Location: [facility] INVASIVE CV LAB;  Service: Vascular;  Laterality: Right;   A/V FISTULAGRAM N/A 02/01/2023   Procedure: A/V Fistulagram;  Surgeon: Tobie Gordy POUR, MD;  Location: [facility] INVASIVE CV LAB;  Service: Cardiovascular;  Laterality: N/A;   A/V SHUNT INTERVENTION N/A 05/10/2023   Procedure: A/V SHUNT INTERVENTION;  Surgeon:  Melia Lynwood ORN, MD;  Location: [facility] INVASIVE CV LAB;  Service: Cardiovascular;  Laterality: N/A;   A/V SHUNT INTERVENTION N/A 06/21/2023   Procedure: A/V SHUNT INTERVENTION;  Surgeon: Serene Gaile ORN, MD;  Location: [facility] PV LAB;  Service: Cardiovascular;  Laterality: N/A;   AV FISTULA PLACEMENT Right 05/24/2022   Procedure: RADIOCEPHALIC ARTERIOVENOUS (AV) FISTULA CREATION;  Surgeon: Lanis Fonda BRAVO, MD;  Location: [facility] OR;  Service: Vascular;  Laterality: Right;   CARDIAC CATHETERIZATION  11/06/2019   [facility] in [place] ;    nonobstructive CAD , pRCA 60% (result in care everywhere)   CATARACT EXTRACTION W/ INTRAOCULAR LENS IMPLANT Bilateral 2017   DIALYSIS/PERMA CATHETER INSERTION N/A 10/03/2023   Procedure: DIALYSIS/PERMA CATHETER INSERTION;  Surgeon: Serene Gaile ORN, MD;  Location: [facility] PV LAB;  Service: Cardiovascular;  Laterality: N/A;   IR FLUORO GUIDE CV LINE RIGHT  05/23/2022   IR US  GUIDE VASC ACCESS RIGHT  05/23/2022   PATCH ANGIOPLASTY Right 09/18/2022   Procedure: BOVINE PATCH ANGIOPLASTY 1CM x 6CM;  Surgeon: Eliza Lonni RAMAN, MD;  Location: [facility] OR;  Service: Vascular;  Laterality: Right;   PERIPHERAL VASCULAR BALLOON ANGIOPLASTY Right 02/01/2023   Procedure: PERIPHERAL VASCULAR BALLOON ANGIOPLASTY;  Surgeon: Tobie Gordy POUR, MD;  Location: [facility] INVASIVE CV LAB;  Service: Cardiovascular;  Laterality: Right;  AA and Cphalic inflow vein   REVISON OF ARTERIOVENOUS FISTULA Right 09/18/2022   Procedure: REVISON OF RIGHT UPPER ARM ARTERIOVENOUS FISTULA;  Surgeon: Eliza Lonni RAMAN, MD;  Location: [facility] OR;  Service: Vascular;  Laterality: Right;   TRANSURETHRAL RESECTION OF PROSTATE N/A 10/16/2021   Procedure: TRANSURETHRAL RESECTION OF THE PROSTATE (TURP);  Surgeon: Selma Donnice SAUNDERS, MD;  Location: [facility] ORS;  Service: Urology;  Laterality: N/A;   VENOUS ANGIOPLASTY Right 05/10/2023   Procedure: VENOUS ANGIOPLASTY;  Surgeon: Melia Lynwood ORN, MD;  Location: [facility] INVASIVE CV LAB;   Service: Cardiovascular;  Laterality: Right;  70% Cephalic Arch    Current Medications: Current Meds  Medication Sig   acetaminophen  (TYLENOL ) 650 MG CR tablet Take 650 mg by mouth every 8 (eight) hours as needed for pain.   BELSOMRA 5 MG TABS Take 1 tablet by mouth at bedtime.   calcium  acetate (PHOSLO) 667 MG capsule Take 1,334 mg by mouth 3 (three) times daily with meals.   carvedilol  (COREG ) 6.25 MG tablet Take 1 tablet (6.25 mg total) by mouth 2 (two) times daily.   famotidine  (PEPCID ) 40 MG tablet Take 40 mg by mouth daily in the afternoon.   furosemide  (LASIX ) 40 MG tablet Take 40 mg by mouth See admin instructions. Take 1 tablet (40mg ) by mouth daily. On Tuesday, Thursday and Saturday take 1 tablet in the afternoon, after dialysis. On non-dialysis days, take 1 tablet every morning.   LANTUS  SOLOSTAR 100 UNIT/ML Solostar Pen Inject 5 Units into the skin at bedtime. (Patient taking differently: Inject 15 Units into the skin at bedtime.)   lidocaine  (LIDODERM ) 5 % Place 1 patch onto the  skin daily. Remove & Discard patch within 12 hours or as directed by MD   meclizine  (ANTIVERT ) 25 MG tablet Take 25 mg by mouth at bedtime.   multivitamin (RENA-VIT) TABS tablet Take 1 tablet by mouth daily in the afternoon.   oxyCODONE -acetaminophen  (PERCOCET/ROXICET) 5-325 MG tablet Take 1 tablet by mouth every 6 (six) hours as needed for severe pain (pain score 7-10).   pioglitazone (ACTOS) 15 MG tablet Take 15 mg by mouth daily.   polyethylene glycol (MIRALAX  / GLYCOLAX ) 17 g packet Take 17 g by mouth daily as needed (constipation).   pregabalin (LYRICA) 50 MG capsule Take 50 mg by mouth daily.   rosuvastatin  (CRESTOR ) 10 MG tablet Take 10 mg by mouth at bedtime.   sevelamer  carbonate (RENVELA ) 800 MG tablet Take 1,600 mg by mouth 3 (three) times daily with meals.   tamsulosin  (FLOMAX ) 0.4 MG CAPS capsule Take 0.8 mg by mouth daily.   tiZANidine  (ZANAFLEX ) 2 MG tablet Take 1 tablet (2 mg total) by  mouth daily.   [DISCONTINUED] amLODipine  (NORVASC ) 5 MG tablet Take 5 mg by mouth See admin instructions. Take 1 tablet (5mg ) by mouth once daily - on Tuesday, Thursday and Saturday take 1 tablet in the afternoon, after dialysis. On non-dialysis days, take 1 tablet every morning.   [DISCONTINUED] hydrALAZINE  (APRESOLINE ) 100 MG tablet Take 100 mg by mouth 3 (three) times daily.   [DISCONTINUED] metoprolol  succinate (TOPROL -XL) 25 MG 24 hr tablet Take 0.5 tablets (12.5 mg total) by mouth daily. (Patient taking differently: Take 12.5 mg by mouth See admin instructions. Take 1/2 tablet (12.5mg ) by mouth daily. On Tuesday, Thursday and Saturday take 1/2 tablet in the afternoon, after dialysis. On non-dialysis days take 1/2 tablet every morning.)     Allergies:   Claritin [loratadine], Latex, Hydrochlorothiazide , Lyrica [pregabalin], and Metformin and related   Family History: The patient's family history includes Hypertension in his mother and sister; Leukemia in his maternal uncle; Pancreatic cancer in his mother; Renal cancer in his mother; Sickle cell trait in his maternal aunt; Stroke in his sister. There is no history of Colon cancer, Esophageal cancer, Rectal cancer, or Stomach cancer.  ROS:   Please see the history of present illness.     All other systems reviewed and are negative.  EKGs/Labs/Other Studies Reviewed:    The following studies were reviewed today: Notes from vascular surgery 10/03/2023  Echocardiogram 12/23/2021   1. Left ventricular ejection fraction, by estimation, is 50 to 55%. The  left ventricle has low normal function. The left ventricle demonstrates  global hypokinesis. There is moderate concentric left ventricular  hypertrophy. Left ventricular diastolic  parameters are consistent with Grade II diastolic dysfunction  (pseudonormalization).   2. Right ventricular systolic function is mildly reduced. The right  ventricular size is mildly enlarged. Tricuspid  regurgitation signal is  inadequate for assessing PA pressure.   3. Left atrial size was mildly dilated.   4. The mitral valve is grossly normal. Trivial mitral valve  regurgitation. No evidence of mitral stenosis.   5. The aortic valve is tricuspid. Aortic valve regurgitation is not  visualized. Aortic valve sclerosis is present, with no evidence of aortic  valve stenosis.   6. The inferior vena cava is dilated in size with >50% respiratory  variability, suggesting right atrial pressure of 8 mmHg.   Comparison(s): LVEF 50-55% on this study, slightly reduced from 55-60.       Recent Labs: 11/28/2022: ALT 16 07/04/2023: Magnesium  1.9; TSH 3.713 10/11/2023:  B Natriuretic Peptide 282.4; BUN 19; Creatinine, Ser 5.29; Hemoglobin 12.4; Platelets 266; Potassium 4.3; Sodium 136   Recent Lipid Panel    Component Value Date/Time   CHOL 104 07/04/2021 0601   CHOL 172 02/25/2018 1657   TRIG 28 07/04/2021 0601   HDL 37 (L) 07/04/2021 0601   HDL 39 (L) 02/25/2018 1657   CHOLHDL 2.8 07/04/2021 0601   VLDL 6 07/04/2021 0601   LDLCALC 61 07/04/2021 0601   LDLCALC 101 (H) 02/25/2018 1657     Risk Assessment/Calculations:            Physical Exam:    VS:  BP (!) 170/70   Pulse 84   Ht 6' 2 (1.88 m)   Wt 223 lb (101.2 kg)   SpO2 99%   BMI 28.63 kg/m     Wt Readings from Last 3 Encounters:  10/15/23 223 lb (101.2 kg)  10/11/23 210 lb (95.3 kg)  07/17/23 206 lb (93.4 kg)     GEN:  Well nourished, well developed in no acute distress HEENT: Normal NECK: No JVD; No carotid bruits LYMPHATICS: No lymphadenopathy CARDIAC: RRR, no murmurs, rubs, gallops.  Occluded left upper arm fistula.  Tunneled dialysis catheter right IJ. RESPIRATORY:  Clear to auscultation without rales, wheezing or rhonchi  ABDOMEN: Soft, non-tender, non-distended MUSCULOSKELETAL:  No edema; No deformity  SKIN: Warm and dry NEUROLOGIC:  Alert and oriented x 3 PSYCHIATRIC:  Normal affect   ASSESSMENT:     No diagnosis found. PLAN:    In order of problems listed above:  HTN: His blood pressure is repeatedly low after dialysis.  He has a very inconsistent antihypertensive regimen, often missing doses of hydralazine .  Will place with amlodipine  and carvedilol  rather than hydralazine  and metoprolol .  Will give him a differential dose of amlodipine  on dialysis days (take 5 mg only after dialysis) versus nondialysis days (take 10 mg in the morning).  We may have to treat this further.  Asked him to keep a log of his blood pressure and let us  know whether the system is working.  Bring him back in a month to fine-tune this new regimen. CHF: Had issues with pulmonary edema/volume overload before starting dialysis, but not since.  Borderline LVEF at 50 to 55%.  One of his antidiabetic medications is Actos, albeit in a low dose. ESRD: Due to diabetic/hypertensive nephropathy, still make some urine which gives us  some flexibility with his volume status.  Problems with dialysis access.  Currently has temporary tunneled catheter. HLP: HDL is not great, but he has an excellent LDL and triglyceride levels.  Continue rosuvastatin  DM: poorly controlled, on insulin  and a low-dose of Actos.           Medication Adjustments/Labs and Tests Ordered: Current medicines are reviewed at length with the patient today.  Concerns regarding medicines are outlined above.  No orders of the defined types were placed in this encounter.  Meds ordered this encounter  Medications   amLODipine  (NORVASC ) 5 MG tablet    Sig: Take 10 mg on Non Dialysis days. On Dialysis Days, Take 5 mg After Dialysis.    Dispense:  150 tablet    Refill:  3   carvedilol  (COREG ) 6.25 MG tablet    Sig: Take 1 tablet (6.25 mg total) by mouth 2 (two) times daily.    Dispense:  180 tablet    Refill:  3    Patient Instructions  Medication Instructions:  Stop Hydralazine  Stop Metoprolol  Start Carvedilol  6.25  mg twice daily On Non-Dialysis  days- Take Amlodipine  10 mg On Dialysis Days- After Dialysis- Take Amlodipine  5 mg Keep a log of your BP and let us  know if it is too high or too low.  *If you need a refill on your cardiac medications before your next appointment, please call your pharmacy*  Lab Work: None ordered If you have labs (blood work) drawn today and your tests are completely normal, you will receive your results only by: MyChart Message (if you have MyChart) OR A paper copy in the mail If you have any lab test that is abnormal or we need to change your treatment, we will call you to review the results.  Testing/Procedures: None ordered  Follow-Up: At [facility], you and your health needs are our priority.  As part of our continuing mission to provide you with exceptional heart care, our providers are all part of one team.  This team includes your primary Cardiologist (physician) and Advanced Practice Providers or APPs (Physician Assistants and Nurse Practitioners) who all work together to provide you with the care you need, when you need it.  Your next appointment:    1 month with APP   We recommend signing up for the patient portal called MyChart.  Sign up information is provided on this After Visit Summary.  MyChart is used to connect with patients for Virtual Visits (Telemedicine).  Patients are able to view lab/test results, encounter notes, upcoming appointments, etc.  Non-urgent messages can be sent to your provider as well.   To learn more about what you can do with MyChart, go to [email or web address].      Signed, Jerel Balding, MD  10/22/2023 8:52 PM    [facility]

## 2023-10-23 ENCOUNTER — Other Ambulatory Visit: Payer: Self-pay

## 2023-10-23 DIAGNOSIS — N186 End stage renal disease: Secondary | ICD-10-CM

## 2023-10-25 ENCOUNTER — Emergency Department (HOSPITAL_COMMUNITY)

## 2023-10-25 ENCOUNTER — Encounter: Admitting: Neurology

## 2023-10-25 ENCOUNTER — Other Ambulatory Visit: Payer: Self-pay

## 2023-10-25 ENCOUNTER — Emergency Department (HOSPITAL_COMMUNITY)
Admission: EM | Admit: 2023-10-25 | Discharge: 2023-10-25 | Disposition: A | Discharge status: Home / Self Care | Attending: Emergency Medicine | Admitting: Emergency Medicine

## 2023-10-25 ENCOUNTER — Encounter (HOSPITAL_COMMUNITY): Payer: Self-pay | Admitting: *Deleted

## 2023-10-25 DIAGNOSIS — K529 Noninfective gastroenteritis and colitis, unspecified: Secondary | ICD-10-CM | POA: Insufficient documentation

## 2023-10-25 DIAGNOSIS — Z794 Long term (current) use of insulin: Secondary | ICD-10-CM | POA: Insufficient documentation

## 2023-10-25 DIAGNOSIS — Z91040 Latex allergy status: Secondary | ICD-10-CM | POA: Insufficient documentation

## 2023-10-25 DIAGNOSIS — R252 Cramp and spasm: Secondary | ICD-10-CM | POA: Diagnosis present

## 2023-10-25 LAB — CBC WITH DIFFERENTIAL/PLATELET
Abs Immature Granulocytes: 0.02 K/uL (ref 0.00–0.07)
Basophils Absolute: 0.1 K/uL (ref 0.0–0.1)
Basophils Relative: 1 %
Eosinophils Absolute: 0.1 K/uL (ref 0.0–0.5)
Eosinophils Relative: 1 %
HCT: 39 % (ref 39.0–52.0)
Hemoglobin: 12.8 g/dL — ABNORMAL LOW (ref 13.0–17.0)
Immature Granulocytes: 0 %
Lymphocytes Relative: 31 %
Lymphs Abs: 2.2 K/uL (ref 0.7–4.0)
MCH: 31.6 pg (ref 26.0–34.0)
MCHC: 32.8 g/dL (ref 30.0–36.0)
MCV: 96.3 fL (ref 80.0–100.0)
Monocytes Absolute: 0.6 K/uL (ref 0.1–1.0)
Monocytes Relative: 9 %
Neutro Abs: 4 K/uL (ref 1.7–7.7)
Neutrophils Relative %: 58 %
Platelets: 246 K/uL (ref 150–400)
RBC: 4.05 MIL/uL — ABNORMAL LOW (ref 4.22–5.81)
RDW: 14.2 % (ref 11.5–15.5)
WBC: 7 K/uL (ref 4.0–10.5)
nRBC: 0 % (ref 0.0–0.2)

## 2023-10-25 LAB — LIPASE, BLOOD: Lipase: 113 U/L — ABNORMAL HIGH (ref 11–51)

## 2023-10-25 LAB — HEPATIC FUNCTION PANEL
ALT: 9 U/L (ref 0–44)
AST: 11 U/L — ABNORMAL LOW (ref 15–41)
Albumin: 3.6 g/dL (ref 3.5–5.0)
Alkaline Phosphatase: 44 U/L (ref 38–126)
Bilirubin, Direct: 0.1 mg/dL (ref 0.0–0.2)
Indirect Bilirubin: 0.6 mg/dL (ref 0.3–0.9)
Total Bilirubin: 0.7 mg/dL (ref 0.0–1.2)
Total Protein: 7.6 g/dL (ref 6.5–8.1)

## 2023-10-25 LAB — BASIC METABOLIC PANEL WITH GFR
Anion gap: 14 (ref 5–15)
BUN: 33 mg/dL — ABNORMAL HIGH (ref 8–23)
CO2: 23 mmol/L (ref 22–32)
Calcium: 8.9 mg/dL (ref 8.9–10.3)
Chloride: 98 mmol/L (ref 98–111)
Creatinine, Ser: 6.82 mg/dL — ABNORMAL HIGH (ref 0.61–1.24)
GFR, Estimated: 8 mL/min — ABNORMAL LOW (ref >60)
Glucose, Bld: 119 mg/dL — ABNORMAL HIGH (ref 70–99)
Potassium: 3.8 mmol/L (ref 3.5–5.1)
Sodium: 135 mmol/L (ref 135–145)

## 2023-10-25 LAB — CK: Total CK: 413 U/L — ABNORMAL HIGH (ref 49–397)

## 2023-10-25 LAB — MAGNESIUM: Magnesium: 1.9 mg/dL (ref 1.7–2.4)

## 2023-10-25 MED ORDER — ACETAMINOPHEN 500 MG PO TABS
1000.0000 mg | ORAL_TABLET | Freq: Once | ORAL | Status: AC
  Administered 2023-10-25: 1000 mg via ORAL
  Filled 2023-10-25: qty 2

## 2023-10-25 NOTE — ED Provider Notes (Signed)
  Physical Exam  BP 118/72 (BP Location: Left Arm)   Pulse 86   Temp 98.4 F (36.9 C) (Oral)   Resp 18   Ht 6' 2 (1.88 m)   Wt 95.3 kg   SpO2 100%   BMI 26.96 kg/m   Physical Exam  Procedures  Procedures  ED Course / MDM   Clinical Course as of 10/25/23 1747  Fri Oct 25, 2023  1553 S. HD T/Tr/Sat.  Cramping abdominal pain + N/V/D, now resolved. Generalized weakness.  [ ]  F/u labs  [WB]    Clinical Course User Index [WB] Cebert Dettmann, Elsie, MD   Medical Decision Making Amount and/or Complexity of Data Reviewed Labs: ordered. Radiology: ordered.  Risk OTC drugs.   On reassessment, patient resting comfortably no acute distress.  CK mildly elevated, however no evidence of acute renal impairment and not diagnostic of exertional or nonexertional rhabdomyolysis.  Hepatic function panel unremarkable.  BMP without evidence of acute electrolytic disturbance.  Lipase elevated, patient with no focal epigastric tenderness and no focal radiographic findings consistent with acute appendicitis.  CBC without leukocytosis.  At this time, patient hemodynamically stable and appropriate for discharge.  Patient reassessed and updated regarding findings.      Dorcus Elsie, MD 10/25/23 LANNETTE    Cleotilde Rogue, MD 10/27/23 TRENNA

## 2023-10-25 NOTE — Discharge Instructions (Addendum)
 You were evaluated in the emergency department for generalized abdominal pain with concern for gastroenteritis.  Your pain was treated with oral Tylenol .  Your labs were unremarkable.  A CT of your abdomen was performed that was negative for evidence of acute abdominal or pelvic abnormality.

## 2023-10-25 NOTE — ED Notes (Signed)
 Pt reports pain has subsided since being medicated with tylenol  and resting comfortably.

## 2023-10-25 NOTE — ED Notes (Signed)
 Phlebotomist enroute to collect labs

## 2023-10-25 NOTE — ED Provider Notes (Signed)
 [place] EMERGENCY DEPARTMENT AT [facility] Provider Note   CSN: [id number] Arrival date & time: 10/25/23  1257     Patient presents with: Muscle Pain   Ryan Jenkins is a 63 y.o. male.    Muscle Pain   Patient is a Tuesday Thursday Saturday dialysis patient here in the emergency department today for bilateral calf cramps but seems to have improved.  He had dialysis yesterday had a full treatment he states that he has felt somewhat weak over the past few days was seen in MD office and told to go to emergency room to have labs checked.  Denies any focal weakness no chest pain or difficulty breathing no syncope or near syncope no head injuries overall feeling well.      Prior to Admission medications   Medication Sig Start Date End Date Taking? Authorizing Provider  acetaminophen  (TYLENOL ) 650 MG CR tablet Take 650 mg by mouth every 8 (eight) hours as needed for pain.    [provider]  amLODipine  (NORVASC ) 5 MG tablet Take 10 mg on Non Dialysis days. On Dialysis Days, Take 5 mg After Dialysis. 10/15/23   Croitoru, Mihai, MD  BELSOMRA 5 MG TABS Take 1 tablet by mouth at bedtime. 04/29/23   [provider]  calcium  acetate (PHOSLO) 667 MG capsule Take 1,334 mg by mouth 3 (three) times daily with meals.    [provider]  carvedilol  (COREG ) 6.25 MG tablet Take 1 tablet (6.25 mg total) by mouth 2 (two) times daily. 10/15/23   Croitoru, Mihai, MD  famotidine  (PEPCID ) 40 MG tablet Take 40 mg by mouth daily in the afternoon.    [provider]  furosemide  (LASIX ) 40 MG tablet Take 40 mg by mouth See admin instructions. Take 1 tablet (40mg ) by mouth daily. On Tuesday, Thursday and Saturday take 1 tablet in the afternoon, after dialysis. On non-dialysis days, take 1 tablet every morning. 05/24/22   [provider]  LANTUS  SOLOSTAR 100 UNIT/ML Solostar Pen Inject 5 Units into the skin at bedtime. Patient taking differently: Inject 15  Units into the skin at bedtime. 03/29/22   Arlice Reichert, MD  lidocaine  (LIDODERM ) 5 % Place 1 patch onto the skin daily. Remove & Discard patch within 12 hours or as directed by MD 10/11/23   Ula Prentice JONELLE, MD  meclizine  (ANTIVERT ) 25 MG tablet Take 25 mg by mouth at bedtime. 01/10/23   [provider]  multivitamin (RENA-VIT) TABS tablet Take 1 tablet by mouth daily in the afternoon.    [provider]  oxyCODONE -acetaminophen  (PERCOCET/ROXICET) 5-325 MG tablet Take 1 tablet by mouth every 6 (six) hours as needed for severe pain (pain score 7-10). 10/11/23   Ula Prentice JONELLE, MD  pioglitazone (ACTOS) 15 MG tablet Take 15 mg by mouth daily. 10/25/22   [provider]  polyethylene glycol (MIRALAX  / GLYCOLAX ) 17 g packet Take 17 g by mouth daily as needed (constipation).    [provider]  pregabalin (LYRICA) 50 MG capsule Take 50 mg by mouth daily.    [provider]  rosuvastatin  (CRESTOR ) 10 MG tablet Take 10 mg by mouth at bedtime.    [provider]  sevelamer  carbonate (RENVELA ) 800 MG tablet Take 1,600 mg by mouth 3 (three) times daily with meals.    [provider]  tamsulosin  (FLOMAX ) 0.4 MG CAPS capsule Take 0.8 mg by mouth daily.    [provider]  tiZANidine  (ZANAFLEX ) 2 MG tablet Take  1 tablet (2 mg total) by mouth daily. 10/11/23   Ula Prentice SAUNDERS, MD    Allergies: Claritin [loratadine], Latex, Hydrochlorothiazide , Lyrica [pregabalin], and Metformin and related    Review of Systems  Updated Vital Signs BP (!) 119/92 (BP Location: Right Arm)   Pulse 83   Temp 98.4 F (36.9 C) (Oral)   Resp 16   Ht 6' 2 (1.88 m)   Wt 95.3 kg   SpO2 100%   BMI 26.96 kg/m   Physical Exam Vitals and nursing note reviewed.  Constitutional:      General: He is not in acute distress. HENT:     Head: Normocephalic and atraumatic.     Nose: Nose normal.  Eyes:     General: No scleral icterus. Cardiovascular:     Rate and  Rhythm: Normal rate and regular rhythm.     Pulses: Normal pulses.     Heart sounds: Normal heart sounds.     Comments: Dialysis port in right chest Pulmonary:     Effort: Pulmonary effort is normal. No respiratory distress.     Breath sounds: No wheezing.  Abdominal:     Palpations: Abdomen is soft.     Tenderness: There is no abdominal tenderness.  Musculoskeletal:     Cervical back: Normal range of motion.     Right lower leg: No edema.     Left lower leg: No edema.     Comments: No lower extremity edema.  No calf tenderness legs are symmetric  Skin:    General: Skin is warm and dry.     Capillary Refill: Capillary refill takes less than 2 seconds.  Neurological:     Mental Status: He is alert. Mental status is at baseline.  Psychiatric:        Mood and Affect: Mood normal.        Behavior: Behavior normal.     (all labs ordered are listed, but only abnormal results are displayed) Labs Reviewed  BASIC METABOLIC PANEL WITH GFR - Abnormal; Notable for the following components:      Result Value   Glucose, Bld 119 (*)    BUN 33 (*)    Creatinine, Ser 6.82 (*)    GFR, Estimated 8 (*)    All other components within normal limits  CBC WITH DIFFERENTIAL/PLATELET - Abnormal; Notable for the following components:   RBC 4.05 (*)    Hemoglobin 12.8 (*)    All other components within normal limits  HEPATIC FUNCTION PANEL - Abnormal; Notable for the following components:   AST 11 (*)    All other components within normal limits  CK - Abnormal; Notable for the following components:   Total CK 413 (*)    All other components within normal limits  LIPASE, BLOOD - Abnormal; Notable for the following components:   Lipase 113 (*)    All other components within normal limits  MAGNESIUM     EKG: None  Radiology: CT ABDOMEN PELVIS WO CONTRAST Result Date: 10/25/2023 EXAM: CT ABDOMEN AND PELVIS WITHOUT CONTRAST 10/25/2023 02:51:28 PM TECHNIQUE: CT of the abdomen and pelvis was  performed without the administration of intravenous contrast. Multiplanar reformatted images are provided for review. Automated exposure control, iterative reconstruction, and/or weight-based adjustment of the mA/kV was utilized to reduce the radiation dose to as low as reasonably achievable. COMPARISON: 04/04/2022 CLINICAL HISTORY: Abdominal pain, acute, nonlocalized; NV and diarrhea now resolved. FINDINGS: LOWER CHEST: No acute abnormality. LIVER: The liver is unremarkable. GALLBLADDER AND BILE  DUCTS: Gallbladder is unremarkable. No biliary ductal dilatation. SPLEEN: No acute abnormality. PANCREAS: No acute abnormality. ADRENAL GLANDS: No acute abnormality. KIDNEYS, URETERS AND BLADDER: Small nonobstructive left renal calculus. Mild urinary bladder wall thickening is noted which may be due to incomplete distention, although cystitis cannot be excluded. No hydronephrosis. No perinephric or periureteral stranding. GI AND BOWEL: Stomach demonstrates no acute abnormality. There is no bowel obstruction. PERITONEUM AND RETROPERITONEUM: No ascites. No free air. VASCULATURE: Aorta is normal in caliber. LYMPH NODES: No lymphadenopathy. REPRODUCTIVE ORGANS: No acute abnormality. BONES AND SOFT TISSUES: No acute osseous abnormality. No focal soft tissue abnormality. IMPRESSION: 1. No acute abdominal or pelvic abnormality detected. 2. Nonobstructive left renal calculus. 3. Mild urinary bladder wall thickening, which may reflect underdistention; cystitis is also a consideration. Electronically signed by: Lynwood Seip MD 10/25/2023 03:16 PM EDT RP Workstation: [id number]     Procedures   Medications Ordered in the ED  acetaminophen  (TYLENOL ) tablet 1,000 mg (1,000 mg Oral Given 10/25/23 1340)    Clinical Course as of 10/26/23 0740  Fri Oct 25, 2023  1553 S. HD T/Tr/Sat.  Cramping abdominal pain + N/V/D, now resolved. Generalized weakness.  [ ]  F/u labs  [WB]    Clinical Course User Index [WB] Beam, Elsie, MD                                  Medical Decision Making Amount and/or Complexity of Data Reviewed Labs: ordered. Radiology: ordered.  Risk OTC drugs.   Patient is a Tuesday Thursday Saturday dialysis patient here in the emergency department today for bilateral calf cramps but seems to have improved.  He had dialysis yesterday had a full treatment he states that he has felt somewhat weak over the past few days was seen in MD office and told to go to emergency room to have labs checked.  Denies any focal weakness no chest pain or difficulty breathing no syncope or near syncope no head injuries overall feeling well.  Patient well-appearing on exam.  Labs unremarkable CK mildly elevated in setting of patient being a CKD on dialysis patient not concerning for rhabdomyolysis or polymyositis in this particular case.  Patient is neurologically intact moving all extremities no asymmetry with gait or strength or sensation.  Factoring in patient's recent nausea vomiting and diarrhea which has now completely resolved I suspect the patient was somewhat dehydrated probably worsened somewhat by recent dialysis.  Magnesium  levels and potassium are normal he is tolerating p.o.  Will discharge home with follow-up with PCP.  Patient given Tylenol  with resolution of symptoms.  Tolerating p.o. at this time CT abdomen pelvis without contrast unremarkable.   Final diagnoses:  Gastroenteritis    ED Discharge Orders     None          Neldon Hamp RAMAN, [place] 10/26/23 9258    Charlyn Sora, MD 10/26/23 1017

## 2023-10-25 NOTE — ED Provider Notes (Incomplete)
 [place] EMERGENCY DEPARTMENT AT [facility] Provider Note   CSN: [id number] Arrival date & time: 10/25/23  1257     Patient presents with: Muscle Pain   Ryan Jenkins is a 63 y.o. male.  {Add pertinent medical, surgical, social history, OB history to HPI:32947} HPI     63 y.o. male with a hx of chronic combined systolic and diastolic heart failure, type 2 diabetes mellitus (complicated by ESRD and chronic hemodialysis, retinopathy, gastroparesis, neuropathy), HTN, aortic atherosclerosis, dyslipidemia, aortic atherosclerosis, history of moderate atherosclerosis without obstructive lesions (cardiac catheterization 2021 with 60% proximal RCA), LBBB   Prior to Admission medications   Medication Sig Start Date End Date Taking? Authorizing Provider  acetaminophen  (TYLENOL ) 650 MG CR tablet Take 650 mg by mouth every 8 (eight) hours as needed for pain.    [provider]  amLODipine  (NORVASC ) 5 MG tablet Take 10 mg on Non Dialysis days. On Dialysis Days, Take 5 mg After Dialysis. 10/15/23   Croitoru, Mihai, MD  BELSOMRA 5 MG TABS Take 1 tablet by mouth at bedtime. 04/29/23   [provider]  calcium  acetate (PHOSLO) 667 MG capsule Take 1,334 mg by mouth 3 (three) times daily with meals.    [provider]  carvedilol  (COREG ) 6.25 MG tablet Take 1 tablet (6.25 mg total) by mouth 2 (two) times daily. 10/15/23   Croitoru, Mihai, MD  famotidine  (PEPCID ) 40 MG tablet Take 40 mg by mouth daily in the afternoon.    [provider]  furosemide  (LASIX ) 40 MG tablet Take 40 mg by mouth See admin instructions. Take 1 tablet (40mg ) by mouth daily. On Tuesday, Thursday and Saturday take 1 tablet in the afternoon, after dialysis. On non-dialysis days, take 1 tablet every morning. 05/24/22   [provider]  LANTUS  SOLOSTAR 100 UNIT/ML Solostar Pen Inject 5 Units into the skin at bedtime. Patient taking differently: Inject 15 Units into the skin at  bedtime. 03/29/22   Arlice Reichert, MD  lidocaine  (LIDODERM ) 5 % Place 1 patch onto the skin daily. Remove & Discard patch within 12 hours or as directed by MD 10/11/23   Ula Prentice JONELLE, MD  meclizine  (ANTIVERT ) 25 MG tablet Take 25 mg by mouth at bedtime. 01/10/23   [provider]  multivitamin (RENA-VIT) TABS tablet Take 1 tablet by mouth daily in the afternoon.    [provider]  oxyCODONE -acetaminophen  (PERCOCET/ROXICET) 5-325 MG tablet Take 1 tablet by mouth every 6 (six) hours as needed for severe pain (pain score 7-10). 10/11/23   Ula Prentice JONELLE, MD  pioglitazone (ACTOS) 15 MG tablet Take 15 mg by mouth daily. 10/25/22   [provider]  polyethylene glycol (MIRALAX  / GLYCOLAX ) 17 g packet Take 17 g by mouth daily as needed (constipation).    [provider]  pregabalin (LYRICA) 50 MG capsule Take 50 mg by mouth daily.    [provider]  rosuvastatin  (CRESTOR ) 10 MG tablet Take 10 mg by mouth at bedtime.    [provider]  sevelamer  carbonate (RENVELA ) 800 MG tablet Take 1,600 mg by mouth 3 (three) times daily with meals.    [provider]  tamsulosin  (FLOMAX ) 0.4 MG CAPS capsule Take 0.8 mg by mouth daily.    [provider]  tiZANidine  (ZANAFLEX ) 2 MG tablet Take 1 tablet (2 mg total) by mouth daily. 10/11/23   Ula Prentice JONELLE, MD    Allergies: Claritin [loratadine], Latex, Hydrochlorothiazide , Lyrica [pregabalin], and Metformin and related  Review of Systems  Updated Vital Signs BP 102/71 (BP Location: Left Arm)   Pulse 96   Temp 98.3 F (36.8 C) (Oral)   Resp 15   Ht 6' 2 (1.88 m)   Wt 95.3 kg   SpO2 100%   BMI 26.96 kg/m   Physical Exam  (all labs ordered are listed, but only abnormal results are displayed) Labs Reviewed - No data to display  EKG: None  Radiology: No results found.  {Document cardiac monitor, telemetry assessment procedure when appropriate:32947} Procedures   Medications  Ordered in the ED - No data to display    {Click here for ABCD2, HEART and other calculators REFRESH Note before signing:1}                              Medical Decision Making  ***  {Document critical care time when appropriate  Document review of labs and clinical decision tools ie CHADS2VASC2, etc  Document your independent review of radiology images and any outside records  Document your discussion with family members, caretakers and with consultants  Document social determinants of health affecting pt's care  Document your decision making why or why not admission, treatments were needed:32947:::1}   Final diagnoses:  None    ED Discharge Orders     None

## 2023-10-25 NOTE — ED Triage Notes (Signed)
 Patient presents to ed ed via [facility] states he had dialysis yest and had a full treatment T-TH-S, c/o muscle cramps after dialysis yest worse today c/o feeling weak, was seen in MD office today and sent to the ED. Right arm restricted.

## 2023-11-05 ENCOUNTER — Encounter (HOSPITAL_COMMUNITY): Payer: Self-pay | Admitting: Vascular Surgery

## 2023-11-05 NOTE — Progress Notes (Signed)
 SDW CALL  Unable to reach pt to give  pre-op  instructions over the phone. Voice mail instructions and email instructions sent to patient including time and place of arrival;medications to take,NPO status,and short stay phone number [phone].   PCP - [facility] Cardiologist - Mihai Croitoru,MD LOV 10/15/23  PPM/ICD -  Device Orders -  Rep Notified -   Chest x-ray - 10/11/23 EKG - 10/11/23 Stress Test - NM 05/30/21 ECHO - 12/9/233 Cardiac Cath - 11/06/2019 [facility] in [place] ; CE  Sleep Study -  CPAP -   Fasting Blood Sugar -  Checks Blood Sugar _____ times a day  Do not take oral diabetes medicines (pills) the morning of surgery. Do not take Pioglitazone (Actos) the day of surgery.   THE NIGHT BEFORE SURGERY, take 7 units of Lantus  insulin .    Check your blood sugar the morning of your surgery when you wake up and every 2 hours until you get to the Short Stay unit.  If your blood sugar is less than 70 mg/dL, you will need to treat for low blood sugar: Do not take insulin . Treat a low blood sugar (less than 70 mg/dL) with  cup of clear juice (cranberry or apple), 4 glucose tablets, OR glucose gel. Recheck blood sugar in 15 minutes after treatment (to make sure it is greater than 70 mg/dL). If your blood sugar is not greater than 70 mg/dL on recheck, call [phone] for further instructions. Report your blood sugar to the short stay nurse when you get to Short Stay.  Blood Thinner Instructions:na Aspirin  Instructions:na  ERAS Protcol - NPO PRE-SURGERY Ensure or G2- no  COVID TEST- na   Anesthesia review: yes-heart murmur, CAD, LBBB, HTN, DM, CKD   Special instructions:    Oral Hygiene is also important to reduce your risk of infection.  Remember - BRUSH YOUR TEETH THE MORNING OF SURGERY WITH YOUR REGULAR TOOTHPASTE

## 2023-11-05 NOTE — Progress Notes (Signed)
 Anesthesia Chart Review: Same day workup  63 year old male follows with cardiology for history of chronic combined heart failure, HTN, HLD, aortic atherosclerosis, moderate nonobstructive CAD, left bundle branch block.  He was seen by Dr. Francyne at 10/15/2023 to consolidate cardiac care.  He was noted to have persistent hypotension following dialysis and his medications were adjusted.  Hydralazine  and metoprolol  were discontinued and he was started on amlodipine  carvedilol   ESRD on HD Tuesday Thursday Saturday via [facility] (thrombosed right arm fistula), uncontrolled IDDM 2 (A1c 10.5 on 07/30/2023) complicated by retinopathy, gastroparesis, neuropathy.  CMP and CBC 10/25/2023 reviewed, creatinine 6.82 consistent with ESRD, mild anemia hemoglobin 12.8, otherwise unremarkable.  Patient will need day of surgery labs and evaluation.  EKG 10/11/23: Sinus rhythm.  Rate 92. LVH with secondary repolarization abnormality. Anterior Q waves, possibly due to LVH  TTE 12/23/2021: 1. Left ventricular ejection fraction, by estimation, is 50 to 55%. The  left ventricle has low normal function. The left ventricle demonstrates  global hypokinesis. There is moderate concentric left ventricular  hypertrophy. Left ventricular diastolic  parameters are consistent with Grade II diastolic dysfunction  (pseudonormalization).   2. Right ventricular systolic function is mildly reduced. The right  ventricular size is mildly enlarged. Tricuspid regurgitation signal is  inadequate for assessing PA pressure.   3. Left atrial size was mildly dilated.   4. The mitral valve is grossly normal. Trivial mitral valve  regurgitation. No evidence of mitral stenosis.   5. The aortic valve is tricuspid. Aortic valve regurgitation is not  visualized. Aortic valve sclerosis is present, with no evidence of aortic  valve stenosis.   6. The inferior vena cava is dilated in size with >50% respiratory  variability, suggesting right atrial  pressure of 8 mmHg.   Comparison(s): LVEF 50-55% on this study, slightly reduced from 55-60.   Nuclear stress 05/29/2021 (Care Everywhere): Impression:   1. No chest pain with stress.  2. No ST changes to suggest ischemia.  3. Normal LV systolic function and wall motion.  4. No ischemia by perfusion imaging.  5. Low risk study.   Cath 11/06/2019 (Care Everywhere): This result has an attachment that is not available.   Prox RCA lesion with 60% stenosis.   Coronary angiogram demonstrated a 60% proximal nondominant small right  coronary stenosis which is not responsible for patient's current clinical  picture.   Total contrast used these procedures 35 cc likely will not affect  patient's renal status   Sheath removal, bedrest, hydration, continue medical management  Coronary Findings Diagnostic Dominance: Left  Right Coronary Artery: Prox RCA lesion with 60% stenosis.   Intervention  No interventions have been documented.      Lynwood Geofm RIGGERS [facility] Short Stay Center/Anesthesiology Phone [phone] 11/05/2023 10:05 AM

## 2023-11-05 NOTE — Anesthesia Preprocedure Evaluation (Signed)
 Anesthesia Evaluation    Airway        Dental   Pulmonary           Cardiovascular hypertension,      Neuro/Psych    GI/Hepatic   Endo/Other  diabetes    Renal/GU      Musculoskeletal   Abdominal   Peds  Hematology   Anesthesia Other Findings   Reproductive/Obstetrics                              Anesthesia Physical Anesthesia Plan  ASA:   Anesthesia Plan:    Post-op Pain Management:    Induction:   PONV Risk Score and Plan:   Airway Management Planned:   Additional Equipment:   Intra-op Plan:   Post-operative Plan:   Informed Consent:   Plan Discussed with:   Anesthesia Plan Comments: (PAT note by Lynwood Hope, PA-C:  63 year old male follows with cardiology for history of chronic combined heart failure, HTN, HLD, aortic atherosclerosis, moderate nonobstructive CAD, left bundle branch block.  He was seen by Dr. Francyne at 10/15/2023 to consolidate cardiac care.  He was noted to have persistent hypotension following dialysis and his medications were adjusted.  Hydralazine  and metoprolol  were discontinued and he was started on amlodipine  carvedilol   ESRD on HD Tuesday Thursday Saturday via [facility] (thrombosed right arm fistula), uncontrolled IDDM 2 (A1c 10.5 on 07/30/2023) complicated by retinopathy, gastroparesis, neuropathy.  CMP and CBC 10/25/2023 reviewed, creatinine 6.82 consistent with ESRD, mild anemia hemoglobin 12.8, otherwise unremarkable.  Patient will need day of surgery labs and evaluation.  EKG 10/11/23: Sinus rhythm.  Rate 92. LVH with secondary repolarization abnormality. Anterior Q waves, possibly due to LVH  TTE 12/23/2021: 1. Left ventricular ejection fraction, by estimation, is 50 to 55%. The  left ventricle has low normal function. The left ventricle demonstrates  global hypokinesis. There is moderate concentric left ventricular  hypertrophy. Left ventricular  diastolic  parameters are consistent with Grade II diastolic dysfunction  (pseudonormalization).   2. Right ventricular systolic function is mildly reduced. The right  ventricular size is mildly enlarged. Tricuspid regurgitation signal is  inadequate for assessing PA pressure.   3. Left atrial size was mildly dilated.   4. The mitral valve is grossly normal. Trivial mitral valve  regurgitation. No evidence of mitral stenosis.   5. The aortic valve is tricuspid. Aortic valve regurgitation is not  visualized. Aortic valve sclerosis is present, with no evidence of aortic  valve stenosis.   6. The inferior vena cava is dilated in size with >50% respiratory  variability, suggesting right atrial pressure of 8 mmHg.   Comparison(s): LVEF 50-55% on this study, slightly reduced from 55-60.   Nuclear stress 05/29/2021 (Care Everywhere): Impression:   1. No chest pain with stress.  2. No ST changes to suggest ischemia.  3. Normal LV systolic function and wall motion.  4. No ischemia by perfusion imaging.  5. Low risk study.   Cath 11/06/2019 (Care Everywhere): This result has an attachment that is not available.   Prox RCA lesion with 60% stenosis.   Coronary angiogram demonstrated a 60% proximal nondominant small right  coronary stenosis which is not responsible for patient's current clinical  picture.   Total contrast used these procedures 35 cc likely will not affect  patient's renal status   Sheath removal, bedrest, hydration, continue medical management  Coronary Findings Diagnostic Dominance: Left  Right Coronary Artery: Prox  RCA lesion with 60% stenosis.   Intervention  No interventions have been documented.    )         Anesthesia Quick Evaluation

## 2023-11-06 ENCOUNTER — Ambulatory Visit (HOSPITAL_COMMUNITY): Admission: RE | Admit: 2023-11-06 | Source: Home / Self Care | Admitting: Vascular Surgery

## 2023-11-06 ENCOUNTER — Encounter (HOSPITAL_COMMUNITY): Payer: Self-pay | Admitting: Physician Assistant

## 2023-11-06 SURGERY — ARTERIOVENOUS (AV) FISTULA CREATION
Anesthesia: Choice | Laterality: Right

## 2023-11-07 ENCOUNTER — Other Ambulatory Visit: Payer: Self-pay

## 2023-11-07 ENCOUNTER — Encounter (HOSPITAL_COMMUNITY): Payer: Self-pay | Admitting: Vascular Surgery

## 2023-11-07 DIAGNOSIS — N186 End stage renal disease: Secondary | ICD-10-CM

## 2023-11-07 NOTE — Progress Notes (Signed)
 Updated patient with new surgery date/time of 11/11/2023 at [facility]

## 2023-11-07 NOTE — Progress Notes (Signed)
 SDW call  Patient was given pre-op  instructions over the phone. Patient verbalized understanding of instructions provided.     PCP - [facility] Cardiologist - Dr. Jerel Croitoru, LOV 10/15/2023 Nephrologist. Dr. Tobie at [facility], dialysis T, [place], S Pulmonary:    PPM/ICD - denies Device Orders - na Rep Notified - na   Chest x-ray - 10/11/2023 EKG -  10/11/2023 Stress Test - 05/29/2021 - CE ECHO - 12/23/2021 Cardiac Cath - 11/06/2019-CE  Sleep Study/sleep apnea/CPAP: denies  Type II diabetic. A1C 10.6 on 07/03/2023 Fasting Blood sugar range: 100-200 How often check sugars: BID Actos, hold DOS Lantus , 7 units night before surgery which is 50% of your regular dose   Blood Thinner Instructions: denies Aspirin  Instructions:denies   ERAS Protcol - NPO   Anesthesia review: Yes. CHF, angina, CAD, HTN, ESRD with dialysis   Patient denies shortness of breath, fever, cough and chest pain over the phone call  Your procedure is scheduled on Friday November 08, 2023  Report to [facility] Main Entrance A at  1100  A.M., then check in with the Admitting office.  Call this number if you have problems the morning of surgery:  [phone]   If you have any questions prior to your surgery date call [phone]: Open Monday-Friday 8am-4pm If you experience any cold or flu symptoms such as cough, fever, chills, shortness of breath, etc. between now and your scheduled surgery, please notify us  at the above number    Remember:  Do not eat or drink after midnight the night before your surgery  Take these medicines the morning of surgery with A SIP OF WATER:  Amlodipine , carvedilol , lyrica, flomax , zanaflex   As needed: Tylenol , percocet  As of today, STOP taking any Aspirin  (unless otherwise instructed by your surgeon) Aleve, Naproxen, Ibuprofen, Motrin, Advil, Goody's, BC's, all herbal medications, fish oil, and all vitamins.

## 2023-11-11 ENCOUNTER — Encounter (HOSPITAL_COMMUNITY): Payer: Self-pay | Admitting: Vascular Surgery

## 2023-11-11 ENCOUNTER — Other Ambulatory Visit: Payer: Self-pay

## 2023-11-11 ENCOUNTER — Ambulatory Visit (HOSPITAL_COMMUNITY): Admitting: Anesthesiology

## 2023-11-11 ENCOUNTER — Ambulatory Visit (HOSPITAL_COMMUNITY)
Admission: RE | Admit: 2023-11-11 | Discharge: 2023-11-11 | Disposition: A | Discharge status: Home / Self Care | Attending: Vascular Surgery | Admitting: Vascular Surgery

## 2023-11-11 ENCOUNTER — Other Ambulatory Visit (HOSPITAL_COMMUNITY): Payer: Self-pay

## 2023-11-11 ENCOUNTER — Encounter (HOSPITAL_COMMUNITY)
Admission: RE | Disposition: A | Discharge status: Home / Self Care | Payer: Self-pay | Source: Home / Self Care | Attending: Vascular Surgery

## 2023-11-11 ENCOUNTER — Ambulatory Visit (HOSPITAL_COMMUNITY)

## 2023-11-11 DIAGNOSIS — Z79899 Other long term (current) drug therapy: Secondary | ICD-10-CM | POA: Diagnosis not present

## 2023-11-11 DIAGNOSIS — I120 Hypertensive chronic kidney disease with stage 5 chronic kidney disease or end stage renal disease: Secondary | ICD-10-CM | POA: Diagnosis not present

## 2023-11-11 DIAGNOSIS — J9601 Acute respiratory failure with hypoxia: Secondary | ICD-10-CM

## 2023-11-11 DIAGNOSIS — E11319 Type 2 diabetes mellitus with unspecified diabetic retinopathy without macular edema: Secondary | ICD-10-CM | POA: Insufficient documentation

## 2023-11-11 DIAGNOSIS — I5042 Chronic combined systolic (congestive) and diastolic (congestive) heart failure: Secondary | ICD-10-CM | POA: Insufficient documentation

## 2023-11-11 DIAGNOSIS — K219 Gastro-esophageal reflux disease without esophagitis: Secondary | ICD-10-CM | POA: Insufficient documentation

## 2023-11-11 DIAGNOSIS — I132 Hypertensive heart and chronic kidney disease with heart failure and with stage 5 chronic kidney disease, or end stage renal disease: Secondary | ICD-10-CM | POA: Diagnosis present

## 2023-11-11 DIAGNOSIS — Z794 Long term (current) use of insulin: Secondary | ICD-10-CM | POA: Insufficient documentation

## 2023-11-11 DIAGNOSIS — E1122 Type 2 diabetes mellitus with diabetic chronic kidney disease: Secondary | ICD-10-CM | POA: Insufficient documentation

## 2023-11-11 DIAGNOSIS — Z992 Dependence on renal dialysis: Secondary | ICD-10-CM | POA: Insufficient documentation

## 2023-11-11 DIAGNOSIS — D631 Anemia in chronic kidney disease: Secondary | ICD-10-CM | POA: Diagnosis not present

## 2023-11-11 DIAGNOSIS — N186 End stage renal disease: Secondary | ICD-10-CM

## 2023-11-11 DIAGNOSIS — E1140 Type 2 diabetes mellitus with diabetic neuropathy, unspecified: Secondary | ICD-10-CM | POA: Diagnosis not present

## 2023-11-11 DIAGNOSIS — Z7984 Long term (current) use of oral hypoglycemic drugs: Secondary | ICD-10-CM | POA: Diagnosis not present

## 2023-11-11 DIAGNOSIS — I2510 Atherosclerotic heart disease of native coronary artery without angina pectoris: Secondary | ICD-10-CM | POA: Diagnosis not present

## 2023-11-11 HISTORY — PX: INSERTION OF ARTERIOVENOUS (AV) ARTEGRAFT ARM: SHX6779

## 2023-11-11 HISTORY — PX: EXCHANGE OF A DIALYSIS CATHETER: SHX5818

## 2023-11-11 LAB — POCT I-STAT, CHEM 8
BUN: 38 mg/dL — ABNORMAL HIGH (ref 8–23)
Calcium, Ion: 1.08 mmol/L — ABNORMAL LOW (ref 1.15–1.40)
Chloride: 101 mmol/L (ref 98–111)
Creatinine, Ser: 8.2 mg/dL — ABNORMAL HIGH (ref 0.61–1.24)
Glucose, Bld: 158 mg/dL — ABNORMAL HIGH (ref 70–99)
HCT: 37 % — ABNORMAL LOW (ref 39.0–52.0)
Hemoglobin: 12.6 g/dL — ABNORMAL LOW (ref 13.0–17.0)
Potassium: 4.2 mmol/L (ref 3.5–5.1)
Sodium: 138 mmol/L (ref 135–145)
TCO2: 23 mmol/L (ref 22–32)

## 2023-11-11 LAB — GLUCOSE, CAPILLARY
Glucose-Capillary: 150 mg/dL — ABNORMAL HIGH (ref 70–99)
Glucose-Capillary: 148 mg/dL — ABNORMAL HIGH (ref 70–99)
Glucose-Capillary: 138 mg/dL — ABNORMAL HIGH (ref 70–99)

## 2023-11-11 SURGERY — INSERTION, GRAFT, ARTERIOVENOUS, UPPER EXTREMITY
Anesthesia: General | Site: Chest

## 2023-11-11 MED ORDER — HEPARIN SODIUM (PORCINE) 1000 UNIT/ML IJ SOLN
INTRAMUSCULAR | Status: DC | PRN
Start: 2023-11-11 — End: 2023-11-11
  Administered 2023-11-11: 5000 [IU] via INTRAVENOUS

## 2023-11-11 MED ORDER — ONDANSETRON HCL 4 MG/2ML IJ SOLN
INTRAMUSCULAR | Status: AC
  Filled 2023-11-11: qty 2

## 2023-11-11 MED ORDER — FENTANYL CITRATE (PF) 250 MCG/5ML IJ SOLN
INTRAMUSCULAR | Status: DC | PRN
  Administered 2023-11-11: 25 ug via INTRAVENOUS
  Administered 2023-11-11: 50 ug via INTRAVENOUS
  Administered 2023-11-11: 25 ug via INTRAVENOUS
  Administered 2023-11-11: 50 ug via INTRAVENOUS

## 2023-11-11 MED ORDER — PHENYLEPHRINE 80 MCG/ML (10ML) SYRINGE FOR IV PUSH (FOR BLOOD PRESSURE SUPPORT)
PREFILLED_SYRINGE | INTRAVENOUS | Status: DC | PRN
  Administered 2023-11-11 (×2): 240 ug via INTRAVENOUS

## 2023-11-11 MED ORDER — LIDOCAINE 2% (20 MG/ML) 5 ML SYRINGE
INTRAMUSCULAR | Status: DC | PRN
  Administered 2023-11-11: 20 mg via INTRAVENOUS

## 2023-11-11 MED ORDER — OXYCODONE HCL 5 MG PO TABS: 5.0000 mg | ORAL_TABLET | Freq: Once | ORAL | Status: DC | PRN

## 2023-11-11 MED ORDER — HEPARIN SODIUM (PORCINE) 1000 UNIT/ML IJ SOLN
INTRAMUSCULAR | Status: DC | PRN
  Administered 2023-11-11: 3800 [IU] via INTRAVENOUS

## 2023-11-11 MED ORDER — LACTATED RINGERS IV SOLN: INTRAVENOUS | Status: DC | PRN

## 2023-11-11 MED ORDER — EPHEDRINE 5 MG/ML INJ
INTRAVENOUS | Status: AC
  Filled 2023-11-11: qty 10

## 2023-11-11 MED ORDER — OXYCODONE-ACETAMINOPHEN 5-325 MG PO TABS
1.0000 | ORAL_TABLET | Freq: Four times a day (QID) | ORAL | 0 refills | Status: AC | PRN
  Filled 2023-11-11: qty 20, 5d supply, fill #0

## 2023-11-11 MED ORDER — PROPOFOL 10 MG/ML IV BOLUS
INTRAVENOUS | Status: AC
  Filled 2023-11-11: qty 20

## 2023-11-11 MED ORDER — CHLORHEXIDINE GLUCONATE 4 % EX SOLN: 60.0000 mL | Freq: Once | CUTANEOUS | Status: DC

## 2023-11-11 MED ORDER — ACETAMINOPHEN 500 MG PO TABS
1000.0000 mg | ORAL_TABLET | Freq: Once | ORAL | Status: AC
  Administered 2023-11-11: 1000 mg via ORAL
  Filled 2023-11-11: qty 2

## 2023-11-11 MED ORDER — ONDANSETRON HCL 4 MG/2ML IJ SOLN
INTRAMUSCULAR | Status: DC | PRN
  Administered 2023-11-11: 4 mg via INTRAVENOUS

## 2023-11-11 MED ORDER — CARVEDILOL 3.125 MG PO TABS
6.2500 mg | ORAL_TABLET | Freq: Once | ORAL | Status: AC
  Administered 2023-11-11: 6.25 mg via ORAL
  Filled 2023-11-11: qty 2

## 2023-11-11 MED ORDER — PHENYLEPHRINE 80 MCG/ML (10ML) SYRINGE FOR IV PUSH (FOR BLOOD PRESSURE SUPPORT)
PREFILLED_SYRINGE | INTRAVENOUS | Status: AC
  Filled 2023-11-11: qty 10

## 2023-11-11 MED ORDER — FENTANYL CITRATE (PF) 250 MCG/5ML IJ SOLN
INTRAMUSCULAR | Status: AC
  Filled 2023-11-11: qty 5

## 2023-11-11 MED ORDER — MIDAZOLAM HCL (PF) 2 MG/2ML IJ SOLN
INTRAMUSCULAR | Status: DC | PRN
  Administered 2023-11-11: 1 mg via INTRAVENOUS

## 2023-11-11 MED ORDER — CHLORHEXIDINE GLUCONATE 0.12 % MT SOLN
OROMUCOSAL | Status: AC
  Administered 2023-11-11: 15 mL
  Filled 2023-11-11: qty 15

## 2023-11-11 MED ORDER — LIDOCAINE 2% (20 MG/ML) 5 ML SYRINGE
INTRAMUSCULAR | Status: AC
  Filled 2023-11-11: qty 5

## 2023-11-11 MED ORDER — PROTAMINE SULFATE 10 MG/ML IV SOLN
INTRAVENOUS | Status: DC | PRN
  Administered 2023-11-11: 20 mg via INTRAVENOUS

## 2023-11-11 MED ORDER — MIDAZOLAM HCL (PF) 2 MG/2ML IJ SOLN: 0.5000 mg | Freq: Once | INTRAMUSCULAR | Status: DC | PRN

## 2023-11-11 MED ORDER — PHENYLEPHRINE HCL-NACL 20-0.9 MG/250ML-% IV SOLN
INTRAVENOUS | Status: DC | PRN
Start: 2023-11-11 — End: 2023-11-11
  Administered 2023-11-11: 40 ug/min via INTRAVENOUS

## 2023-11-11 MED ORDER — EPHEDRINE SULFATE-NACL 50-0.9 MG/10ML-% IV SOSY
PREFILLED_SYRINGE | INTRAVENOUS | Status: DC | PRN
  Administered 2023-11-11 (×2): 10 mg via INTRAVENOUS
  Administered 2023-11-11: 5 mg via INTRAVENOUS

## 2023-11-11 MED ORDER — SODIUM CHLORIDE 0.9 % IV SOLN: INTRAVENOUS | Status: DC

## 2023-11-11 MED ORDER — CEFAZOLIN SODIUM 1 G IJ SOLR
INTRAMUSCULAR | Status: AC
  Filled 2023-11-11: qty 20

## 2023-11-11 MED ORDER — PROTAMINE SULFATE 10 MG/ML IV SOLN
INTRAVENOUS | Status: AC
  Filled 2023-11-11: qty 5

## 2023-11-11 MED ORDER — CEFAZOLIN SODIUM-DEXTROSE 2-4 GM/100ML-% IV SOLN
2.0000 g | INTRAVENOUS | Status: AC
  Administered 2023-11-11: 2 g via INTRAVENOUS
  Filled 2023-11-11: qty 100

## 2023-11-11 MED ORDER — HEPARIN 6000 UNIT IRRIGATION SOLUTION
Status: DC | PRN
  Administered 2023-11-11: 1

## 2023-11-11 MED ORDER — IODIXANOL 320 MG/ML IV SOLN
INTRAVENOUS | Status: DC | PRN
  Administered 2023-11-11: 30 mL via INTRAVENOUS

## 2023-11-11 MED ORDER — 0.9 % SODIUM CHLORIDE (POUR BTL) OPTIME
TOPICAL | Status: DC | PRN
  Administered 2023-11-11: 1000 mL

## 2023-11-11 MED ORDER — MIDAZOLAM HCL 2 MG/2ML IJ SOLN
INTRAMUSCULAR | Status: AC
  Filled 2023-11-11: qty 2

## 2023-11-11 MED ORDER — PROPOFOL 10 MG/ML IV BOLUS
INTRAVENOUS | Status: DC | PRN
Start: 2023-11-11 — End: 2023-11-11
  Administered 2023-11-11: 100 mg via INTRAVENOUS
  Administered 2023-11-11: 50 mg via INTRAVENOUS

## 2023-11-11 MED ORDER — EPHEDRINE 5 MG/ML INJ
INTRAVENOUS | Status: AC
  Filled 2023-11-11: qty 5

## 2023-11-11 MED ORDER — FENTANYL CITRATE (PF) 100 MCG/2ML IJ SOLN
INTRAMUSCULAR | Status: AC
  Filled 2023-11-11: qty 2

## 2023-11-11 MED ORDER — OXYCODONE HCL 5 MG/5ML PO SOLN: 5.0000 mg | Freq: Once | ORAL | Status: DC | PRN

## 2023-11-11 MED ORDER — INSULIN ASPART 100 UNIT/ML IJ SOLN: 0.0000 [IU] | INTRAMUSCULAR | Status: DC | PRN

## 2023-11-11 MED ORDER — FENTANYL CITRATE (PF) 100 MCG/2ML IJ SOLN
25.0000 ug | INTRAMUSCULAR | Status: DC | PRN
  Administered 2023-11-11: 25 ug via INTRAVENOUS

## 2023-11-11 SURGICAL SUPPLY — 43 items
ARMBAND PINK RESTRICT EXTREMIT (MISCELLANEOUS) ×3 IMPLANT
BIOPATCH RED 1 DISK 7.0 (GAUZE/BANDAGES/DRESSINGS) ×3 IMPLANT
BLADE CLIPPER SURG (BLADE) ×3 IMPLANT
CANISTER SUCTION 3000ML PPV (SUCTIONS) ×3 IMPLANT
CATH PALINDROME-P 23 W/VT (CATHETERS) ×1 IMPLANT
CLIP TI MEDIUM 6 (CLIP) ×3 IMPLANT
CLIP TI WIDE RED SMALL 6 (CLIP) ×4 IMPLANT
COVER PROBE W GEL 5X96 (DRAPES) ×3 IMPLANT
COVER SURGICAL LIGHT HANDLE (MISCELLANEOUS) ×3 IMPLANT
DERMABOND ADVANCED .7 DNX12 (GAUZE/BANDAGES/DRESSINGS) ×3 IMPLANT
DRAPE CHEST BREAST 15X10 FENES (DRAPES) ×3 IMPLANT
DRSG COVADERM 4X6 (GAUZE/BANDAGES/DRESSINGS) ×1 IMPLANT
ELECTRODE REM PT RTRN 9FT ADLT (ELECTROSURGICAL) ×3 IMPLANT
GAUZE 4X4 16PLY ~~LOC~~+RFID DBL (SPONGE) ×3 IMPLANT
GLIDEWIRE ADV .035X180CM (WIRE) ×1 IMPLANT
GLOVE BIOGEL PI IND STRL 8 (GLOVE) ×3 IMPLANT
GOWN STRL REUS W/ TWL LRG LVL3 (GOWN DISPOSABLE) ×6 IMPLANT
GOWN STRL REUS W/TWL 2XL LVL3 (GOWN DISPOSABLE) ×6 IMPLANT
GRAFT GORETEX STRT 4-7X45 (Vascular Products) ×1 IMPLANT
KIT BASIN OR (CUSTOM PROCEDURE TRAY) ×3 IMPLANT
KIT PALINDROME-P 55CM (CATHETERS) IMPLANT
KIT TURNOVER KIT B (KITS) ×3 IMPLANT
NDL 18GX1X1/2 (RX/OR ONLY) (NEEDLE) ×2 IMPLANT
NEEDLE 18GX1X1/2 (RX/OR ONLY) (NEEDLE) ×3 IMPLANT
PACK CV ACCESS (CUSTOM PROCEDURE TRAY) ×3 IMPLANT
PAD ARMBOARD POSITIONER FOAM (MISCELLANEOUS) ×6 IMPLANT
SET MICROPUNCTURE 5F STIFF (MISCELLANEOUS) ×1 IMPLANT
SOAP 2 % CHG 4 OZ (WOUND CARE) ×3 IMPLANT
SOLN 0.9% NACL POUR BTL 1000ML (IV SOLUTION) ×3 IMPLANT
SOLN STERILE WATER BTL 1000 ML (IV SOLUTION) ×3 IMPLANT
SPONGE T-LAP 18X18 ~~LOC~~+RFID (SPONGE) ×1 IMPLANT
SUT ETHILON 3 0 PS 1 (SUTURE) ×3 IMPLANT
SUT MNCRL AB 4-0 PS2 18 (SUTURE) ×4 IMPLANT
SUT PROLENE 6 0 BV (SUTURE) ×5 IMPLANT
SUT PROLENE 7 0 BV 1 (SUTURE) IMPLANT
SUT VIC AB 3-0 SH 27X BRD (SUTURE) ×4 IMPLANT
SYR 10ML LL (SYRINGE) ×3 IMPLANT
SYR 20ML LL LF (SYRINGE) ×6 IMPLANT
SYR 30ML LL (SYRINGE) IMPLANT
SYR 5ML LL (SYRINGE) ×3 IMPLANT
SYR CONTROL 10ML LL (SYRINGE) ×3 IMPLANT
TOWEL GREEN STERILE (TOWEL DISPOSABLE) ×3 IMPLANT
UNDERPAD 30X36 HEAVY ABSORB (UNDERPADS AND DIAPERS) ×3 IMPLANT

## 2023-11-11 NOTE — H&P (Signed)
 CC: End-stage renal disease requiring dialysis Requesting Provider:  No ref. provider found  HPI: Ryan Jenkins is a 63 y.o. (01-18-1960) male presenting at the request of [facility], [address] with end-stage renal disease requiring dialysis.  Patient recently underwent right arm fistulogram with tunneled dialysis catheter placement.  The prior radiocephalic fistula which was created in the right AC fossa was abandoned.  He presents today for new access.  He has no complaints.  Denies previous procedures in the left upper extremity.  Has had multiple balloon angioplasties for central stenosis in the right chest.   Past Medical History:  Diagnosis Date   Anemia    Anginal pain    Anxiety    Arthritis    BPH with obstruction/lower urinary tract symptoms    CAD (coronary artery disease)    cardiologist--- dr marcello badal;  11-06-2019 cardiac cath in [place]  (result in care everywhere)  nonobstructive cad involing pRCA 60% (done in setting worseing CHF/ acute pulmonary edema requiring intubation/ AKI   Chronic combined systolic and diastolic CHF (congestive heart failure) (HCC)    Chronic kidney disease, stage IV (severe) (HCC)    nephrologist--- dr dolan   Depression    Diabetic neuropathy (HCC)    Dyspnea    Edema of both lower extremities    Gastric ulcer without hemorrhage or perforation 12/25/2018   GERD (gastroesophageal reflux disease)    Heart murmur    Hemodialysis patient    History of community acquired pneumonia    admission 06-04-2021 [place] [facility]  w/ ARF hypoxia w/ severe sepsis   Hyperlipidemia    Hypertension    Insulin  dependent type 2 diabetes mellitus (HCC)    LBBB (left bundle branch block)    Pneumonia    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)    Uses walker    Vitamin B12 deficiency    Vitreous hemorrhage [facility])     Past Surgical History:  Procedure Laterality Date   A/V FISTULAGRAM Right 07/17/2022   Procedure: A/V Fistulagram;  Surgeon:  Eliza Lonni RAMAN, MD;  Location: [facility] INVASIVE CV LAB;  Service: Vascular;  Laterality: Right;   A/V FISTULAGRAM N/A 02/01/2023   Procedure: A/V Fistulagram;  Surgeon: Tobie Gordy POUR, MD;  Location: [facility] INVASIVE CV LAB;  Service: Cardiovascular;  Laterality: N/A;   A/V SHUNT INTERVENTION N/A 05/10/2023   Procedure: A/V SHUNT INTERVENTION;  Surgeon: Melia Lynwood ORN, MD;  [facility];  Service: Cardiovascular;  Laterality: N/A;   A/V SHUNT INTERVENTION N/A 06/21/2023   Procedure: A/V SHUNT INTERVENTION;  Surgeon: Serene Gaile ORN, MD;  Location: [facility] PV LAB;  Service: Cardiovascular;  Laterality: N/A;   AV FISTULA PLACEMENT Right 05/24/2022   Procedure: RADIOCEPHALIC ARTERIOVENOUS (AV) FISTULA CREATION;  Surgeon: Lanis Fonda BRAVO, MD;  Location: [facility] OR;  Service: Vascular;  Laterality: Right;   CARDIAC CATHETERIZATION  11/06/2019   [facility] in [place] ;    nonobstructive CAD , pRCA 60% (result in care everywhere)   CATARACT EXTRACTION W/ INTRAOCULAR LENS IMPLANT Bilateral 2017   DIALYSIS/PERMA CATHETER INSERTION N/A 10/03/2023   Procedure: DIALYSIS/PERMA CATHETER INSERTION;  Surgeon: Serene Gaile ORN, MD;  Location: [facility] PV LAB;  Service: Cardiovascular;  Laterality: N/A;   IR FLUORO GUIDE CV LINE RIGHT  05/23/2022   IR US  GUIDE VASC ACCESS RIGHT  05/23/2022   PATCH ANGIOPLASTY Right 09/18/2022   Procedure: BOVINE PATCH ANGIOPLASTY 1CM x 6CM;  Surgeon: Eliza Lonni RAMAN, MD;  Location: [facility] OR;  Service: Vascular;  Laterality: Right;   PERIPHERAL VASCULAR BALLOON ANGIOPLASTY Right 02/01/2023   Procedure: PERIPHERAL VASCULAR BALLOON ANGIOPLASTY;  Surgeon: Tobie Gordy POUR, MD;  Location: [facility] INVASIVE CV LAB;  Service: Cardiovascular;  Laterality: Right;  AA and Cphalic inflow vein   REVISON OF ARTERIOVENOUS FISTULA Right 09/18/2022   Procedure: REVISON OF RIGHT UPPER ARM ARTERIOVENOUS FISTULA;  Surgeon: Eliza Lonni RAMAN, MD;  Location: [facility] OR;  Service: Vascular;   Laterality: Right;   TRANSURETHRAL RESECTION OF PROSTATE N/A 10/16/2021   Procedure: TRANSURETHRAL RESECTION OF THE PROSTATE (TURP);  Surgeon: Selma Donnice SAUNDERS, MD;  Location: [facility] ORS;  Service: Urology;  Laterality: N/A;   VENOUS ANGIOPLASTY Right 05/10/2023   Procedure: VENOUS ANGIOPLASTY;  Surgeon: Melia Lynwood ORN, MD;  Location: [facility] INVASIVE CV LAB;  Service: Cardiovascular;  Laterality: Right;  70% Cephalic Arch    Social History   Socioeconomic History   Marital status: Divorced    Spouse name: Not on file   Number of children: 1   Years of education: 12   Highest education level: Not on file  Occupational History   Occupation: unemployed    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 Right Handed    Are you currently employed ? 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 No    Who lives with you? With brother    What type of home do you live in: 1 story or 2 story? Lives in a one story apartment.       Social Drivers of Corporate Investment Banker Strain: Low Risk  (01/18/2022)   Received from [facility]   Overall Financial Resource Strain (CARDIA)    Difficulty of Paying Living Expenses: Not very hard  Food Insecurity: No Food Insecurity (07/03/2023)   Hunger Vital Sign    Worried About Running Out of Food in the Last Year: Never true    Ran Out of Food in the Last Year: Never true  Transportation Needs: No Transportation Needs (07/03/2023)   PRAPARE - Administrator, Civil Service (Medical): No    Lack of Transportation (Non-Medical): No  Physical  Activity: Inactive (11/23/2020)   Received from [facility]   Exercise Vital Sign    On average, how many days per week do you engage in moderate to strenuous exercise (like a brisk walk)?: 0 days    On average, how many minutes do you engage in exercise at this level?: 0 min  Stress: Stress Concern Present (11/23/2020)   Received from [facility] - Occupational Stress Questionnaire    Feeling of Stress : To some extent  Social Connections: Unknown (05/16/2021)   Received from [facility]   Social Network    Social Network: Not on file  Intimate Partner Violence: Not At Risk (07/03/2023)   Humiliation, Afraid, Rape, and Kick questionnaire    Fear of Current or Ex-Partner: No  Emotionally Abused: No    Physically Abused: No    Sexually Abused: No   Family History  Problem Relation Age of Onset   Renal cancer Mother    Hypertension Mother    Pancreatic cancer Mother    Hypertension Sister    Stroke Sister    Leukemia Maternal Uncle    Sickle cell trait Maternal Aunt    Colon cancer Neg Hx    Esophageal cancer Neg Hx    Rectal cancer Neg Hx    Stomach cancer Neg Hx     Current Facility-Administered Medications  Medication Dose Route Frequency Provider Last Rate Last Admin   0.9 %  sodium chloride  infusion   Intravenous Continuous Lanis Fonda BRAVO, MD 10 mL/hr at 11/11/23 1009 New Bag at 11/11/23 1009   ceFAZolin  (ANCEF ) IVPB 2g/100 mL premix  2 g Intravenous 30 min Pre-Op  Lanis Fonda BRAVO, MD       chlorhexidine  (HIBICLENS ) 4 % liquid 4 Application  60 mL Topical Once Lanis Fonda BRAVO, MD       insulin  aspart (novoLOG ) injection 0-7 Units  0-7 Units Subcutaneous Q2H PRN Leonce Athens, MD        Allergies  Allergen Reactions   Claritin [Loratadine] Swelling and Other (See Comments)    Joint swelling   Latex Hives and Swelling    catheter   Hydrochlorothiazide  Other (See Comments)    Dizziness   Lyrica [Pregabalin] Other  (See Comments)    Depression Makes me loopy    Metformin And Related Diarrhea and Nausea And Vomiting     REVIEW OF SYSTEMS:  [X]  denotes positive finding, [ ]  denotes negative finding Cardiac  Comments:  Chest pain or chest pressure:    Shortness of breath upon exertion:    Short of breath when lying flat:    Irregular heart rhythm:        Vascular    Pain in calf, thigh, or hip brought on by ambulation:    Pain in feet at night that wakes you up from your sleep:     Blood clot in your veins:    Leg swelling:         Pulmonary    Oxygen at home:    Productive cough:     Wheezing:         Neurologic    Sudden weakness in arms or legs:     Sudden numbness in arms or legs:     Sudden onset of difficulty speaking or slurred speech:    Temporary loss of vision in one eye:     Problems with dizziness:         Gastrointestinal    Blood in stool:     Vomited blood:         Genitourinary    Burning when urinating:     Blood in urine:        Psychiatric    Major depression:         Hematologic    Bleeding problems:    Problems with blood clotting too easily:        Skin    Rashes or ulcers:        Constitutional    Fever or chills:      PHYSICAL EXAMINATION:  Vitals:   11/07/23 0922 11/11/23 0906  BP:  123/67  Pulse:  96  Temp:  98.4 F (36.9 C)  TempSrc:  Oral  SpO2:  98%  Weight: 95.3 kg 95.3  kg  Height: 6' 2 (1.88 m) 6' 2 (1.88 m)    General:  WDWN in NAD; vital signs documented above Gait: Not observed HENT: WNL, normocephalic Pulmonary: normal non-labored breathing , without wheezing Cardiac: regular HR Abdomen: soft, NT, no masses Skin: without rashes Vascular Exam/Pulses:  Right Left  Radial 2+ (normal) 2+ (normal)                       Extremities: without ischemic changes, without Gangrene , without cellulitis; without open wounds;  Musculoskeletal: no muscle wasting or atrophy  Neurologic: A&O X 3;  No focal weakness or  paresthesias are detected Psychiatric:  The pt has Normal affect.   Non-Invasive Vascular Imaging:   Hide brachial bifurcations bilaterally    ASSESSMENT/PLAN: DAMYN WEITZEL is a 63 y.o. male presenting with end-stage renal disease requiring dialysis.  He does not have any usable vein on ultrasound today.  I think he would be best served with AV graft.  Being that he has had multiple balloon angioplasties for central stenosis in the right upper extremity, I do not want to place new access in the right arm.  I think that he would be best served with left-sided access.  We discussed that if the radial artery is being enough-due to the high bifurcation-I would do a radial artery to axillary vein AV graft.  If the radial artery is small, I would perform a left arm AV loop graft.  After discussing the risks and benefits, Ryan Jenkins elected to proceed with left arm access creation.    Fonda FORBES Rim, MD [facility] [phone]

## 2023-11-11 NOTE — Op Note (Signed)
    NAME: Ryan Jenkins    MRN: [id number] DOB: 06-Aug-1960    DATE OF OPERATION: 11/11/2023  PREOP DIAGNOSIS:    End-stage renal disease requiring dialysis  POSTOP DIAGNOSIS:    Same  PROCEDURE:    Left upper arm AV loop graft 4-7 tapered PTFE  SURGEON: Fonda FORBES Rim  ASSIST: Lucie Motto, PA  ANESTHESIA: General  EBL: 50ml  INDICATIONS:   BEECHER FURIO is a 63 y.o. male with end-stage renal disease and history of right sided radiocephalic fistula at the antecubital fossa.  He has known bilateral high brachial artery bifurcations.  After discussing the risks and benefits of left arm AV graft creation, Dedrick elected to proceed.  FINDINGS:   2.5 mm left radial artery at the antecubital fossa 5 mm axillary artery 7 mm axillary vein  TECHNIQUE:   Patient is brought to the OR laid in supine position.  General anesthesia was induced and the patient's prepped and draped in standard fashion.  The case began with ultrasound insonation of the left arm.  The left radial artery was marked at the level immediately cephalad to the antecubital fossa, and the radial artery was identified.  This was very small, this was not amenable to fistula creation.  Therefore, I moved to the axilla.  A longitudinal incision was made.  The axillary artery and axillary vein were both dissected out.  This took considerable amount of time as the location was slightly abnormal.  The artery and vein were wrapped in brachial plexus which is unusual at this location.  I spent a conserve amount of time carefully dissecting and creating a window for anastomosis on both the artery and vein.  A large loop was made with a counterincision, and the 4 7 tapered graft tunneled through the loop.  The patient was then heparinized and the artery clamped both proximally and distally.  A longitudinal arteriotomy made.  The artery was very fragile.  6-0 Prolene suture was used in running fashion to sew the arterial  anastomosis in end-to-side fashion.  Next, the graft was pressurized to ensure there was no kinking.  There was not.  I was happy with the limb of the graft.  This was pulled to length to avoid stretching and subsequent kinking.  The vein was then transected and an end-to-end anastomosis performed using running 6-0 Prolene suture.  Prior to completion, the graft was de-aired.  At completion, there was a thrill within the graft.  I was happy with this result.  The wound beds were irrigated with copious amounts of saline and closed in layers using Vicryl with Monocryl and Dermabond to the level of the skin.    Fonda FORBES Rim, MD [facility] DATE OF DICTATION:   11/11/2023

## 2023-11-11 NOTE — Anesthesia Procedure Notes (Signed)
 Procedure Name: LMA Insertion Date/Time: 11/11/2023 1:01 PM  Performed by: Myrna Homer, CRNAPre-anesthesia Checklist: Patient identified, Emergency Drugs available, Suction available and Patient being monitored Patient Re-evaluated:Patient Re-evaluated prior to induction Oxygen Delivery Method: Circle System Utilized Preoxygenation: Pre-oxygenation with 100% oxygen Induction Type: IV induction Ventilation: Mask ventilation without difficulty LMA: LMA inserted LMA Size: 5.0 Number of attempts: 1 Airway Equipment and Method: Bite block Placement Confirmation: positive ETCO2 Tube secured with: Tape Dental Injury: Teeth and Oropharynx as per pre-operative assessment

## 2023-11-11 NOTE — Transfer of Care (Signed)
 Immediate Anesthesia Transfer of Care Note  Patient: Ryan Jenkins  Procedure(s) Performed: INSERTION, GRAFT, ARTERIOVENOUS, UPPER EXTREMITY (Left: Arm Upper) EXCHANGE OF A DIALYSIS CATHETER (Chest)  Patient Location: PACU  Anesthesia Type:General  Level of Consciousness: awake, patient cooperative, and responds to stimulation  Airway & Oxygen Therapy: Patient Spontanous Breathing and Patient connected to face mask oxygen  Post-op Assessment: Report given to RN and Post -op Vital signs reviewed and stable  Post vital signs: Reviewed and stable  Last Vitals:  Vitals Value Taken Time  BP    Temp    Pulse 75 11/11/23 15:35  Resp 13 11/11/23 15:35  SpO2 100 % 11/11/23 15:35  Vitals shown include unfiled device data.  Last Pain:  Vitals:   11/11/23 1012  TempSrc:   PainSc: 0-No pain         Complications: No notable events documented.

## 2023-11-11 NOTE — Discharge Instructions (Signed)
   [facility]  Discharge Instructions  AV Fistula or Graft Surgery for Dialysis Access  Please refer to the following instructions for your post-procedure care. Your surgeon or physician assistant will discuss any changes with you.  Activity  You may drive the day following your surgery, if you are comfortable and no longer taking prescription pain medication. Resume full activity as the soreness in your incision resolves.  Bathing/Showering  You may shower after you go home. Keep your incision dry for 48 hours. Do not soak in a bathtub, hot tub, or swim until the incision heals completely. You may not shower if you have a hemodialysis catheter.  Incision Care  Clean your incision with mild soap and water after 48 hours. Pat the area dry with a clean towel. You do not need a bandage unless otherwise instructed. Do not apply any ointments or creams to your incision. You may have skin glue on your incision. Do not peel it off. It will come off on its own in about one week. Your arm may swell a bit after surgery. To reduce swelling use pillows to elevate your arm so it is above your heart. Your doctor will tell you if you need to lightly wrap your arm with an ACE bandage.  Diet  Resume your normal diet. There are not special food restrictions following this procedure. In order to heal from your surgery, it is CRITICAL to get adequate nutrition. Your body requires vitamins, minerals, and protein. Vegetables are the best source of vitamins and minerals. Vegetables also provide the perfect balance of protein. Processed food has little nutritional value, so try to avoid this.  Medications  Resume taking all of your medications. If your incision is causing pain, you may take over-the counter pain relievers such as acetaminophen  (Tylenol ). If you were prescribed a stronger pain medication, please be aware these medications can cause nausea and constipation. Prevent  nausea by taking the medication with a snack or meal. Avoid constipation by drinking plenty of fluids and eating foods with high amount of fiber, such as fruits, vegetables, and grains.  Do not take Tylenol  if you are taking prescription pain medications.  Follow up Your surgeon may want to see you in the office following your access surgery. If so, this will be arranged at the time of your surgery.  Please call us  immediately for any of the following conditions:  Increased pain, redness, drainage (pus) from your incision site Fever of 101 degrees or higher Severe or worsening pain at your incision site Hand pain or numbness.  Reduce your risk of vascular disease:  Stop smoking. If you would like help, call [facility] at 1-800-QUIT-NOW (281-462-4980) or [place] at 838 277 5107  Manage your cholesterol Maintain a desired weight Control your diabetes Keep your blood pressure down  Dialysis  It will take several weeks to several months for your new dialysis access to be ready for use. Your surgeon will determine when it is okay to use it. Your nephrologist will continue to direct your dialysis. You can continue to use your Permcath until your new access is ready for use.   11/11/2023 LAMAR METER [id number] 1960/08/12  Surgeon(s): Lanis Fonda BRAVO, MD  Procedure(s): INSERTION, GRAFT, ARTERIOVENOUS, UPPER EXTREMITY EXCHANGE OF A DIALYSIS CATHETER  x Do not stick graft for 4 weeks    If you have any questions, please call the office at [phone].

## 2023-11-11 NOTE — Anesthesia Preprocedure Evaluation (Addendum)
 Anesthesia Evaluation  Patient identified by MRN, date of birth, ID band Patient awake    Reviewed: Allergy & Precautions, NPO status , Patient's Chart, lab work & pertinent test results, reviewed documented beta blocker date and time   History of Anesthesia Complications Negative for: history of anesthetic complications  Airway Mallampati: I  TM Distance: >3 FB Neck ROM: Full    Dental  (+) Missing, Dental Advisory Given   Pulmonary neg pulmonary ROS   breath sounds clear to auscultation       Cardiovascular hypertension, Pt. on medications and Pt. on home beta blockers (-) angina + CAD (non-obstructive)   Rhythm:Regular Rate:Normal  '23 ECHO:  --LV: Left ventricle size is normal.There is moderate concentric LVH.   Left Ventricle: Systolic function is normal. EF: 60-65%. mild diastolic dysfunction and low to normal LA pressure.   Left Atrium: Left atrium is mildly dilated.   Right Ventricle: Right ventricle is normal.Systolic function is normal.   No major valvular abnormalities.    Neuro/Psych   Anxiety Depression    negative neurological ROS     GI/Hepatic Neg liver ROS,GERD  Medicated and Controlled,,  Endo/Other  diabetes, Insulin  Dependent, Oral Hypoglycemic Agents  BMI 27  Renal/GU ESRF and DialysisRenal diseaseK+ 4.2     Musculoskeletal  (+) Arthritis ,    Abdominal   Peds  Hematology negative hematology ROS (+) Hb 12.6   Anesthesia Other Findings   Reproductive/Obstetrics                              Anesthesia Physical Anesthesia Plan  ASA: 3  Anesthesia Plan: General   Post-op Pain Management: Tylenol  PO (pre-op )*   Induction: Intravenous  PONV Risk Score and Plan: 2 and Ondansetron  and Dexamethasone   Airway Management Planned: LMA  Additional Equipment: None  Intra-op Plan:   Post-operative Plan:   Informed Consent: I have reviewed the patients History  and Physical, chart, labs and discussed the procedure including the risks, benefits and alternatives for the proposed anesthesia with the patient or authorized representative who has indicated his/her understanding and acceptance.     Dental advisory given  Plan Discussed with: CRNA and Surgeon  Anesthesia Plan Comments:          Anesthesia Quick Evaluation

## 2023-11-12 ENCOUNTER — Other Ambulatory Visit: Payer: Self-pay

## 2023-11-12 ENCOUNTER — Encounter (HOSPITAL_COMMUNITY): Payer: Self-pay | Admitting: Vascular Surgery

## 2023-11-12 ENCOUNTER — Emergency Department (HOSPITAL_COMMUNITY)
Admission: EM | Admit: 2023-11-12 | Discharge: 2023-11-12 | Disposition: A | Discharge status: Home / Self Care | Attending: Emergency Medicine | Admitting: Emergency Medicine

## 2023-11-12 DIAGNOSIS — R1013 Epigastric pain: Secondary | ICD-10-CM | POA: Insufficient documentation

## 2023-11-12 DIAGNOSIS — R1012 Left upper quadrant pain: Secondary | ICD-10-CM | POA: Diagnosis present

## 2023-11-12 DIAGNOSIS — Z91040 Latex allergy status: Secondary | ICD-10-CM | POA: Diagnosis not present

## 2023-11-12 DIAGNOSIS — R112 Nausea with vomiting, unspecified: Secondary | ICD-10-CM | POA: Diagnosis not present

## 2023-11-12 LAB — CBC WITH DIFFERENTIAL/PLATELET
Abs Immature Granulocytes: 0.02 K/uL (ref 0.00–0.07)
Basophils Absolute: 0.1 K/uL (ref 0.0–0.1)
Basophils Relative: 1 %
Eosinophils Absolute: 0.1 K/uL (ref 0.0–0.5)
Eosinophils Relative: 2 %
HCT: 36.9 % — ABNORMAL LOW (ref 39.0–52.0)
Hemoglobin: 11.9 g/dL — ABNORMAL LOW (ref 13.0–17.0)
Immature Granulocytes: 0 %
Lymphocytes Relative: 22 %
Lymphs Abs: 1.6 K/uL (ref 0.7–4.0)
MCH: 31.6 pg (ref 26.0–34.0)
MCHC: 32.2 g/dL (ref 30.0–36.0)
MCV: 98.1 fL (ref 80.0–100.0)
Monocytes Absolute: 0.8 K/uL (ref 0.1–1.0)
Monocytes Relative: 10 %
Neutro Abs: 4.9 K/uL (ref 1.7–7.7)
Neutrophils Relative %: 65 %
Platelets: 198 K/uL (ref 150–400)
RBC: 3.76 MIL/uL — ABNORMAL LOW (ref 4.22–5.81)
RDW: 14.3 % (ref 11.5–15.5)
WBC: 7.6 K/uL (ref 4.0–10.5)
nRBC: 0 % (ref 0.0–0.2)

## 2023-11-12 LAB — COMPREHENSIVE METABOLIC PANEL WITH GFR
ALT: 7 U/L (ref 0–44)
AST: 14 U/L — ABNORMAL LOW (ref 15–41)
Albumin: 3.5 g/dL (ref 3.5–5.0)
Alkaline Phosphatase: 48 U/L (ref 38–126)
Anion gap: 15 (ref 5–15)
BUN: 23 mg/dL (ref 8–23)
CO2: 24 mmol/L (ref 22–32)
Calcium: 8.7 mg/dL — ABNORMAL LOW (ref 8.9–10.3)
Chloride: 98 mmol/L (ref 98–111)
Creatinine, Ser: 5.65 mg/dL — ABNORMAL HIGH (ref 0.61–1.24)
GFR, Estimated: 11 mL/min — ABNORMAL LOW (ref >60)
Glucose, Bld: 161 mg/dL — ABNORMAL HIGH (ref 70–99)
Potassium: 4.1 mmol/L (ref 3.5–5.1)
Sodium: 137 mmol/L (ref 135–145)
Total Bilirubin: 0.5 mg/dL (ref 0.0–1.2)
Total Protein: 7.3 g/dL (ref 6.5–8.1)

## 2023-11-12 LAB — LIPASE, BLOOD: Lipase: 30 U/L (ref 11–51)

## 2023-11-12 MED ORDER — PANTOPRAZOLE SODIUM 40 MG IV SOLR
40.0000 mg | Freq: Once | INTRAVENOUS | Status: AC
  Administered 2023-11-12: 40 mg via INTRAVENOUS
  Filled 2023-11-12: qty 10

## 2023-11-12 MED ORDER — ONDANSETRON HCL 4 MG/2ML IJ SOLN
4.0000 mg | Freq: Once | INTRAMUSCULAR | Status: AC
  Administered 2023-11-12: 4 mg via INTRAVENOUS
  Filled 2023-11-12: qty 2

## 2023-11-12 MED ORDER — ONDANSETRON 4 MG PO TBDP: 4.0000 mg | ORAL_TABLET | Freq: Three times a day (TID) | ORAL | 0 refills | Status: AC | PRN

## 2023-11-12 NOTE — ED Notes (Signed)
Phlebotomy contacted for blood work.

## 2023-11-12 NOTE — ED Notes (Signed)
 Attempted IV x2.  Contacted 2nd RN to try for a line.

## 2023-11-12 NOTE — ED Notes (Signed)
 Attempted blood draw on patient X 2.  Drew 2 gold tops, and a blue top as extras.  Unable to get CMP, Lipase, and CBC.

## 2023-11-12 NOTE — Anesthesia Postprocedure Evaluation (Signed)
 Anesthesia Post Note  Patient: Ryan Jenkins  Procedure(s) Performed: INSERTION, GRAFT, ARTERIOVENOUS, UPPER EXTREMITY (Left: Arm Upper) EXCHANGE OF A DIALYSIS CATHETER (Chest)     Patient location during evaluation: PACU Anesthesia Type: General Level of consciousness: awake Pain management: pain level controlled Vital Signs Assessment: post-procedure vital signs reviewed and stable Respiratory status: spontaneous breathing, nonlabored ventilation and respiratory function stable Cardiovascular status: blood pressure returned to baseline and stable Postop Assessment: no apparent nausea or vomiting Anesthetic complications: no   No notable events documented.  Last Vitals:  Vitals:   11/11/23 1600 11/11/23 1615  BP: 133/65 127/66  Pulse: 72 77  Resp: 14 13  Temp: 36.6 C   SpO2: 98% 100%    Last Pain:  Vitals:   11/11/23 1600  TempSrc:   PainSc: 3                  Deliah Strehlow P Michaela Shankel

## 2023-11-12 NOTE — ED Notes (Signed)
 Phlebotomy at bedside.

## 2023-11-12 NOTE — ED Triage Notes (Signed)
 Pt having NV abd pain for  2 hours. Denies diarrhea. Pain is luq and ruq. Abd is slightly distended and hard. Pt coming dialysis. Pt finished dialysis. VS 90/p hr 50 cbg 190 rr 18. LBM yesterday and normal

## 2023-11-12 NOTE — ED Provider Notes (Signed)
 [facility] Provider Note   CSN: [id number] Arrival date & time: 11/12/23  1538     Patient presents with: Abdominal Pain   Ryan Jenkins is a 63 y.o. male.    Abdominal Pain Patient has abdominal pain.  Upper abdomen.  Some nausea and vomiting.  History of same.  Dialysis patient was dialyzed today.  Had been seen in the ER just over 2 weeks ago with some similar symptoms.  Had CT scan at that time was reassuring.  States symptoms now returned but has been feeling better for few days.  No fevers.  No cough.     Prior to Admission medications   Medication Sig Start Date End Date Taking? Authorizing Provider  ondansetron  (ZOFRAN -ODT) 4 MG disintegrating tablet Take 1 tablet (4 mg total) by mouth every 8 (eight) hours as needed. 11/12/23  Yes Patsey Lot, MD  acetaminophen  (TYLENOL ) 650 MG CR tablet Take 650 mg by mouth every 8 (eight) hours as needed for pain.    [provider]  acetaminophen -codeine  (TYLENOL  #2) 300-15 MG tablet Take 1 tablet by mouth every 8 (eight) hours as needed for moderate pain (pain score 4-6). 10/09/23   [provider]  amLODipine  (NORVASC ) 5 MG tablet Take 10 mg on Non Dialysis days. On Dialysis Days, Take 5 mg After Dialysis. 10/15/23   Croitoru, Mihai, MD  BELSOMRA 5 MG TABS Take 1 tablet by mouth at bedtime as needed (Sleep). 04/29/23   [provider]  calcium  acetate (PHOSLO) 667 MG capsule Take 1,334 mg by mouth 3 (three) times daily with meals.    [provider]  carvedilol  (COREG ) 6.25 MG tablet Take 1 tablet (6.25 mg total) by mouth 2 (two) times daily. 10/15/23   Croitoru, Mihai, MD  famotidine  (PEPCID ) 40 MG tablet Take 40 mg by mouth at bedtime as needed for heartburn.    [provider]  furosemide  (LASIX ) 40 MG tablet Take 40 mg by mouth daily. 05/24/22   [provider]  glimepiride (AMARYL) 1 MG tablet Take 1 mg by mouth daily. 10/10/23    [provider]  LANTUS  SOLOSTAR 100 UNIT/ML Solostar Pen Inject 5 Units into the skin at bedtime. Patient taking differently: Inject 15 Units into the skin at bedtime. 03/29/22   Arlice Reichert, MD  lidocaine  (LIDODERM ) 5 % Place 1 patch onto the skin daily. Remove & Discard patch within 12 hours or as directed by MD Patient taking differently: Place 1 patch onto the skin daily as needed (Pain). Remove & Discard patch within 12 hours or as directed by MD 10/11/23   Ula Prentice JONELLE, MD  meclizine  (ANTIVERT ) 25 MG tablet Take 25 mg by mouth at bedtime. 01/10/23   [provider]  methocarbamol  (ROBAXIN ) 500 MG tablet Take 500 mg by mouth 2 (two) times daily as needed for muscle spasms. 10/07/23   [provider]  multivitamin (RENA-VIT) TABS tablet Take 1 tablet by mouth daily in the afternoon.    [provider]  oxyCODONE -acetaminophen  (PERCOCET/ROXICET) 5-325 MG tablet Take 1 tablet by mouth every 6 (six) hours as needed for severe pain (pain score 7-10). 11/11/23   Rhyne, Samantha J, PA-C  pantoprazole  (PROTONIX ) 20 MG tablet Take 20 mg by mouth every morning. 08/26/23   [provider]  pioglitazone (ACTOS) 15 MG tablet Take 15 mg by mouth daily. 10/25/22   [provider]  polyethylene glycol (MIRALAX  / GLYCOLAX ) 17 g packet Take 17  g by mouth daily as needed (constipation).    [provider]  pregabalin (LYRICA) 50 MG capsule Take 50 mg by mouth daily. Patient not taking: Reported on 11/11/2023    [provider]  rosuvastatin  (CRESTOR ) 10 MG tablet Take 10 mg by mouth at bedtime.    [provider]  sevelamer  carbonate (RENVELA ) 800 MG tablet Take 1,600 mg by mouth 3 (three) times daily with meals.    [provider]  tamsulosin  (FLOMAX ) 0.4 MG CAPS capsule Take 0.8 mg by mouth daily.    [provider]  tiZANidine  (ZANAFLEX ) 2 MG tablet Take 1 tablet (2 mg total) by mouth daily. Patient taking  differently: Take 2 mg by mouth daily as needed for muscle spasms. 10/11/23   Ula Prentice SAUNDERS, MD    Allergies: Claritin [loratadine], Latex, Hydrochlorothiazide , Lyrica [pregabalin], and Metformin and related    Review of Systems  Gastrointestinal:  Positive for abdominal pain.    Updated Vital Signs BP 128/62   Pulse 92   Temp 98 F (36.7 C) (Oral)   Resp 19   Ht 6' 2 (1.88 m)   Wt 95.3 kg   SpO2 100%   BMI 26.98 kg/m   Physical Exam Nursing note reviewed.  Cardiovascular:     Rate and Rhythm: Normal rate.  Abdominal:     Tenderness: There is abdominal tenderness.     Hernia: No hernia is present.     Comments: Mild upper abdominal tenderness.  No distention.  No hernia palpated.  Skin:    General: Skin is warm.  Neurological:     Mental Status: He is alert.   Postsurgical changes left upper extremity from recent dialysis surgery.  (all labs ordered are listed, but only abnormal results are displayed) Labs Reviewed  COMPREHENSIVE METABOLIC PANEL WITH GFR - Abnormal; Notable for the following components:      Result Value   Glucose, Bld 161 (*)    Creatinine, Ser 5.65 (*)    Calcium  8.7 (*)    AST 14 (*)    GFR, Estimated 11 (*)    All other components within normal limits  CBC WITH DIFFERENTIAL/PLATELET - Abnormal; Notable for the following components:   RBC 3.76 (*)    Hemoglobin 11.9 (*)    HCT 36.9 (*)    All other components within normal limits  LIPASE, BLOOD  URINALYSIS, ROUTINE W REFLEX MICROSCOPIC    EKG: None  Radiology: DG Chest Port 1 View Result Date: 11/11/2023 CLINICAL DATA:  Status post dialysis catheter insertion EXAM: PORTABLE CHEST 1 VIEW COMPARISON:  Chest radiograph dated 10/11/2023 FINDINGS: Lines/tubes: Right internal jugular venous catheter tip projects over the right atrium. Lungs: Low lung volumes with bronchovascular crowding. No focal consolidation. Pleura: No pneumothorax or pleural effusion. Heart/mediastinum: The heart size  and mediastinal contours are within normal limits. Bones: No acute osseous abnormality. IMPRESSION: Right internal jugular venous catheter tip projects over the right atrium. No pneumothorax. Electronically Signed   By: Limin  Xu M.D.   On: 11/11/2023 16:20   HYBRID OR IMAGING ([facility] ONLY) Result Date: 11/11/2023 There is no interpretation for this exam.  This order is for images obtained during a surgical procedure.  Please See Surgeries Tab for more information regarding the procedure.     Procedures   Medications Ordered in the ED  ondansetron  (ZOFRAN ) injection 4 mg (4 mg Intravenous Given 11/12/23 1740)  pantoprazole  (PROTONIX ) injection 40 mg (40 mg Intravenous Given 11/12/23 1741)  Medical Decision Making Amount and/or Complexity of Data Reviewed Labs: ordered.  Risk Prescription drug management.    Patient with upper abdominal pain.  Some nausea.  Differential diagnose includes causes such as pancreatitis, obstruction.  Will get basic blood work.  Will give symptomatic treatment.  Reviewed recent ER visit with similar symptoms that had negative CT at that time. Blood work reassuring.  Feeling better after treatment.  Will treat symptomatically.  Will need to follow-up with PCP.  Potential causes such as gastroparesis considered.    Final diagnoses:  Epigastric pain    ED Discharge Orders          Ordered    ondansetron  (ZOFRAN -ODT) 4 MG disintegrating tablet  Every 8 hours PRN        11/12/23 1948               Patsey Lot, MD 11/12/23 2246

## 2023-11-12 NOTE — ED Provider Triage Note (Signed)
 Emergency Medicine Provider Triage Evaluation Note  Ryan Jenkins , a 63 y.o. male  was evaluated in triage.  Pt complains of vomiting and abdominal pain.  Pt was at dialysis and   Review of Systems  Positive: Abdominal pain Negative: fever  Physical Exam  BP 116/62 (BP Location: Right Arm)   Pulse 100   Temp 98.1 F (36.7 C)   Resp 15   Ht 6' 2 (1.88 m)   Wt 95.3 kg   SpO2 100%   BMI 26.98 kg/m  Gen:   Awake, no distress  Resp:  Normal effort  MSK:   Moves extremities without difficulty  Other:    Medical Decision Making  Medically screening exam initiated at 4:19 PM.  Appropriate orders placed.  ABDULHAMID OLGIN was informed that the remainder of the evaluation will be completed by another provider, this initial triage assessment does not replace that evaluation, and the importance of remaining in the ED until their evaluation is complete.  Pt to move to Ed room for further evaluation    Flint Sonny POUR, PA-C 11/12/23 1621

## 2023-11-12 NOTE — ED Notes (Signed)
 Pt aware a urine sample is needed. PER PT he is unable to urinate at this time. Urinal at bedside.  Breathing is even and unlabored. Call light within reach, encouraged to use when needs arise.  PT given a warm blanket at this time.

## 2023-11-13 ENCOUNTER — Encounter: Payer: Self-pay | Admitting: *Deleted

## 2023-11-14 NOTE — Progress Notes (Deleted)
{  This patient may be at risk for Amyloid. He has one or more dx on the prob list or PMH from the following list -  Abnormal EKG, HFpEF/Diastolic CHF, Aortic Stenosis, LVH, Bilateral Carpal Tunnel Syndrome, Biceps Tendon Rupture, Spinal Stenosis, Pericardial Effusion, Left Atrial Enlargement, Conduction System Disorder. See list below or review PMH.  Diagnoses From Problem List           Noted     Chronic bilateral low back pain with right-sided sciatica 07/03/2023     Chronic diastolic heart failure (HCC) 04/04/2022     Diabetic gastroparesis (HCC) 04/09/2022     Diabetic neuropathy, type II diabetes mellitus (HCC) 05/11/2014    Click HERE to open Cardiac Amyloid Screening SmartSet to order screening OR Click HERE to defer testing for 1 year or permanently :1}     OFFICE NOTE:    Date:  11/14/2023  ID:  Ryan Jenkins, DOB Jun 28, 1960, MRN [id number] PCP: [facility]  [facility] Providers Cardiologist:  None { Click to update primary MD,subspecialty MD or APP then REFRESH:1}       Coronary artery disease LHC 11/06/19 ([place] ): pRCA 60 SPECT MPI 05/29/21 ([facility]): no ischemia, low risk  (HFpEF) heart failure with preserved ejection fraction  TTE 12/23/21: EF 50-55, global HK, mod LVH, Gr 2 DD, mildly reduced RVSF, mild RVE, mild LAE, trivial MR, AV sclerosis Diabetes mellitus Type 2 ESRD on hemodialysis  C/b hypotension Neph: Dr. Dolan  Hypertension  Hyperlipidemia  Aortic atherosclerosis  Gastroparesis  Neuropathy Left Bundle Branch Block        Discussed the use of AI scribe software for clinical note transcription with the patient, who gave verbal consent to proceed. History of Present Illness Ryan Jenkins is a 63 y.o. male for follow up of BP. He established with Dr. Francyne 10/15/23. BP was uncontrolled. He has hypotension at dialysis. He was missing doses of hydralazine . Meds were consolidated to Carvedilol  and Amlodipine . He was asked to take  Amlodipine  5 mg after dialysis (10 mg on nondialysis days).     ROS-See HPI***    Studies Reviewed:      *** Results  Risk Assessment/Calculations: {Does this patient have ATRIAL FIBRILLATION?:[phone]} No BP recorded.  {Refresh Note OR Click here to enter BP  :1}***      Physical Exam:  VS:  There were no vitals taken for this visit.       Wt Readings from Last 3 Encounters:  11/12/23 210 lb 1.6 oz (95.3 kg)  11/11/23 210 lb (95.3 kg)  10/25/23 210 lb (95.3 kg)    Physical Exam***     Assessment and Plan:    Assessment & Plan Essential hypertension  Coronary artery disease due to lipid rich plaque  Chronic heart failure with preserved ejection fraction (HFpEF) (HCC)  ESRD (end stage renal disease) (HCC)  Assessment and Plan Assessment & Plan    {      :1}    {Are you ordering a CV Procedure (e.g. stress test, cath, DCCV, TEE, etc)?   Press F2        :[id number]}  Dispo:  No follow-ups on file.  Signed, Glendia Ferrier, PA-C

## 2023-11-15 ENCOUNTER — Ambulatory Visit: Attending: Physician Assistant | Admitting: Physician Assistant

## 2023-11-15 ENCOUNTER — Other Ambulatory Visit: Payer: Self-pay | Admitting: *Deleted

## 2023-11-15 DIAGNOSIS — I2510 Atherosclerotic heart disease of native coronary artery without angina pectoris: Secondary | ICD-10-CM

## 2023-11-15 DIAGNOSIS — N186 End stage renal disease: Secondary | ICD-10-CM

## 2023-11-15 DIAGNOSIS — I10 Essential (primary) hypertension: Secondary | ICD-10-CM

## 2023-11-15 DIAGNOSIS — I5032 Chronic diastolic (congestive) heart failure: Secondary | ICD-10-CM

## 2023-11-18 ENCOUNTER — Encounter: Payer: Self-pay | Admitting: Physician Assistant

## 2023-11-27 ENCOUNTER — Ambulatory Visit: Admitting: Neurology

## 2023-11-27 ENCOUNTER — Encounter: Payer: Self-pay | Admitting: Neurology

## 2023-12-05 ENCOUNTER — Ambulatory Visit (HOSPITAL_COMMUNITY)

## 2023-12-09 ENCOUNTER — Ambulatory Visit (HOSPITAL_COMMUNITY)

## 2023-12-09 ENCOUNTER — Ambulatory Visit

## 2023-12-30 ENCOUNTER — Ambulatory Visit (HOSPITAL_BASED_OUTPATIENT_CLINIC_OR_DEPARTMENT_OTHER)
Admission: RE | Admit: 2023-12-30 | Discharge: 2023-12-30 | Disposition: A | Discharge status: Home / Self Care | Source: Ambulatory Visit | Attending: Vascular Surgery | Admitting: Vascular Surgery

## 2023-12-30 ENCOUNTER — Other Ambulatory Visit: Payer: Self-pay | Admitting: Vascular Surgery

## 2023-12-30 ENCOUNTER — Ambulatory Visit (INDEPENDENT_AMBULATORY_CARE_PROVIDER_SITE_OTHER): Admitting: Physician Assistant

## 2023-12-30 ENCOUNTER — Ambulatory Visit (HOSPITAL_COMMUNITY)
Admission: RE | Admit: 2023-12-30 | Discharge: 2023-12-30 | Disposition: A | Discharge status: Home / Self Care | Source: Ambulatory Visit | Attending: Vascular Surgery | Admitting: Vascular Surgery

## 2023-12-30 VITALS — BP 179/96 | HR 101 | Temp 97.9°F | Wt 214.6 lb

## 2023-12-30 DIAGNOSIS — N186 End stage renal disease: Secondary | ICD-10-CM

## 2023-12-30 DIAGNOSIS — Z992 Dependence on renal dialysis: Secondary | ICD-10-CM | POA: Insufficient documentation

## 2023-12-30 NOTE — Progress Notes (Signed)
 POST OPERATIVE OFFICE NOTE    CC:  F/u for surgery  HPI:  This is a 63 y.o. male who is s/p left upper arm AV loop graft by Dr. Lanis on 11/11/2023.  He has known high brachial artery bifurcations bilaterally.  He denies any steal symptoms in his left hand.  He believes his incisions are well-healed.  He is currently dialyzing via right IJ [name] on a Tuesday Thursday Saturday at the [facility] location.  He has had access in his right arm previously.  Allergies[1]  Current Outpatient Medications  Medication Sig Dispense Refill   acetaminophen  (TYLENOL ) 650 MG CR tablet Take 650 mg by mouth every 8 (eight) hours as needed for pain.     acetaminophen -codeine  (TYLENOL  #2) 300-15 MG tablet Take 1 tablet by mouth every 8 (eight) hours as needed for moderate pain (pain score 4-6).     amLODipine  (NORVASC ) 5 MG tablet Take 10 mg on Non Dialysis days. On Dialysis Days, Take 5 mg After Dialysis. 150 tablet 3   BELSOMRA 5 MG TABS Take 1 tablet by mouth at bedtime as needed (Sleep).     calcium  acetate (PHOSLO) 667 MG capsule Take 1,334 mg by mouth 3 (three) times daily with meals.     carvedilol  (COREG ) 6.25 MG tablet Take 1 tablet (6.25 mg total) by mouth 2 (two) times daily. 180 tablet 3   famotidine  (PEPCID ) 40 MG tablet Take 40 mg by mouth at bedtime as needed for heartburn.     furosemide  (LASIX ) 40 MG tablet Take 40 mg by mouth daily.     glimepiride (AMARYL) 1 MG tablet Take 1 mg by mouth daily.     LANTUS  SOLOSTAR 100 UNIT/ML Solostar Pen Inject 5 Units into the skin at bedtime. (Patient taking differently: Inject 15 Units into the skin at bedtime.)     lidocaine  (LIDODERM ) 5 % Place 1 patch onto the skin daily. Remove & Discard patch within 12 hours or as directed by MD (Patient taking differently: Place 1 patch onto the skin daily as needed (Pain). Remove & Discard patch within 12 hours or as directed by MD) 30 patch 0   meclizine  (ANTIVERT ) 25 MG tablet Take 25 mg by mouth at bedtime.      methocarbamol  (ROBAXIN ) 500 MG tablet Take 500 mg by mouth 2 (two) times daily as needed for muscle spasms.     multivitamin (RENA-VIT) TABS tablet Take 1 tablet by mouth daily in the afternoon.     ondansetron  (ZOFRAN -ODT) 4 MG disintegrating tablet Take 1 tablet (4 mg total) by mouth every 8 (eight) hours as needed. 8 tablet 0   oxyCODONE -acetaminophen  (PERCOCET/ROXICET) 5-325 MG tablet Take 1 tablet by mouth every 6 (six) hours as needed for severe pain (pain score 7-10). 20 tablet 0   pantoprazole  (PROTONIX ) 20 MG tablet Take 20 mg by mouth every morning.     pioglitazone (ACTOS) 15 MG tablet Take 15 mg by mouth daily.     polyethylene glycol (MIRALAX  / GLYCOLAX ) 17 g packet Take 17 g by mouth daily as needed (constipation).     pregabalin (LYRICA) 50 MG capsule Take 50 mg by mouth daily. (Patient not taking: Reported on 11/11/2023)     rosuvastatin  (CRESTOR ) 10 MG tablet Take 10 mg by mouth at bedtime.     sevelamer  carbonate (RENVELA ) 800 MG tablet Take 1,600 mg by mouth 3 (three) times daily with meals.     tamsulosin  (FLOMAX ) 0.4 MG CAPS capsule Take 0.8 mg by  mouth daily.     tiZANidine  (ZANAFLEX ) 2 MG tablet Take 1 tablet (2 mg total) by mouth daily. (Patient taking differently: Take 2 mg by mouth daily as needed for muscle spasms.) 15 tablet 0   No current facility-administered medications for this visit.     ROS:  See HPI  Physical Exam:  Vitals:   12/30/23 1309  BP: (!) 179/96  Pulse: (!) 101  Temp: 97.9 F (36.6 C)  TempSrc: Temporal  Weight: 214 lb 9.6 oz (97.3 kg)    Incision:  L arm incisions c/d/i Extremities:  palpable L radial pulse; palpable thrill throughout AVG Neuro: A&O  Assessment/Plan:  This is a 63 y.o. male who is s/p: Left upper arm loop graft  Left hand well-perfused with a palpable radial pulse.  He has a patent left arm AV graft with a palpable thrill and no signs or symptoms of steal syndrome.  Okay to begin cannulating the graft as early as  tomorrow at 12/31/2023.  Right IJ TDC can be removed when nephrology is comfortable with the performance of the graft.  He will follow-up on an as-needed basis.   Ryan Sender, PA-C [facility] [phone]  Clinic MD:  [facility]     [1]  Allergies Allergen Reactions   Claritin [Loratadine] Swelling and Other (See Comments)    Joint swelling   Latex Hives and Swelling    catheter   Hydrochlorothiazide  Other (See Comments)    Dizziness   Lyrica [Pregabalin] Other (See Comments)    Depression Makes me loopy    Metformin And Related Diarrhea and Nausea And Vomiting

## 2024-01-06 ENCOUNTER — Other Ambulatory Visit (HOSPITAL_COMMUNITY): Payer: Self-pay | Admitting: Nephrology

## 2024-01-06 ENCOUNTER — Telehealth (HOSPITAL_COMMUNITY): Payer: Self-pay

## 2024-01-06 DIAGNOSIS — N186 End stage renal disease: Secondary | ICD-10-CM

## 2024-01-06 NOTE — Telephone Encounter (Signed)
LMOM for pt to callback for scheduling

## 2024-01-13 ENCOUNTER — Telehealth (HOSPITAL_COMMUNITY): Payer: Self-pay

## 2024-01-13 ENCOUNTER — Other Ambulatory Visit (HOSPITAL_COMMUNITY): Payer: Self-pay | Admitting: Nephrology

## 2024-01-13 ENCOUNTER — Telehealth: Payer: Self-pay

## 2024-01-13 DIAGNOSIS — N186 End stage renal disease: Secondary | ICD-10-CM

## 2024-01-13 NOTE — Telephone Encounter (Signed)
 LMOM for pt to call back to reschedule.

## 2024-01-13 NOTE — Telephone Encounter (Signed)
 Fax rec'd from Dr Gearline regarding need for fistulagram due to prolonged bleeding.  Patient will need fistulagram prior to [facility] removal.  [facility] IR dept informed of need.  TDC removal will be postponed until further notice.  Fistulagram will take precedence.

## 2024-01-14 ENCOUNTER — Other Ambulatory Visit (HOSPITAL_COMMUNITY)

## 2024-01-20 ENCOUNTER — Other Ambulatory Visit: Payer: Self-pay

## 2024-01-20 ENCOUNTER — Other Ambulatory Visit (HOSPITAL_COMMUNITY): Payer: Self-pay | Admitting: Nephrology

## 2024-01-20 ENCOUNTER — Ambulatory Visit (HOSPITAL_COMMUNITY)
Admission: RE | Admit: 2024-01-20 | Discharge: 2024-01-20 | Disposition: A | Discharge status: Home / Self Care | Source: Ambulatory Visit | Attending: Nephrology | Admitting: Nephrology

## 2024-01-20 ENCOUNTER — Encounter (HOSPITAL_COMMUNITY): Payer: Self-pay

## 2024-01-20 VITALS — BP 130/80 | HR 92 | Temp 98.2°F | Resp 16 | Ht 74.0 in | Wt 212.0 lb

## 2024-01-20 DIAGNOSIS — Z992 Dependence on renal dialysis: Secondary | ICD-10-CM | POA: Diagnosis not present

## 2024-01-20 DIAGNOSIS — T82858A Stenosis of vascular prosthetic devices, implants and grafts, initial encounter: Secondary | ICD-10-CM | POA: Diagnosis not present

## 2024-01-20 DIAGNOSIS — Z01818 Encounter for other preprocedural examination: Secondary | ICD-10-CM

## 2024-01-20 DIAGNOSIS — Z7984 Long term (current) use of oral hypoglycemic drugs: Secondary | ICD-10-CM | POA: Diagnosis not present

## 2024-01-20 DIAGNOSIS — Y832 Surgical operation with anastomosis, bypass or graft as the cause of abnormal reaction of the patient, or of later complication, without mention of misadventure at the time of the procedure: Secondary | ICD-10-CM | POA: Insufficient documentation

## 2024-01-20 DIAGNOSIS — E1122 Type 2 diabetes mellitus with diabetic chronic kidney disease: Secondary | ICD-10-CM | POA: Diagnosis not present

## 2024-01-20 DIAGNOSIS — I5042 Chronic combined systolic (congestive) and diastolic (congestive) heart failure: Secondary | ICD-10-CM | POA: Insufficient documentation

## 2024-01-20 DIAGNOSIS — N186 End stage renal disease: Secondary | ICD-10-CM | POA: Diagnosis present

## 2024-01-20 DIAGNOSIS — Z560 Unemployment, unspecified: Secondary | ICD-10-CM | POA: Insufficient documentation

## 2024-01-20 DIAGNOSIS — Z794 Long term (current) use of insulin: Secondary | ICD-10-CM | POA: Insufficient documentation

## 2024-01-20 DIAGNOSIS — I130 Hypertensive heart and chronic kidney disease with heart failure and stage 1 through stage 4 chronic kidney disease, or unspecified chronic kidney disease: Secondary | ICD-10-CM | POA: Insufficient documentation

## 2024-01-20 DIAGNOSIS — I871 Compression of vein: Secondary | ICD-10-CM | POA: Diagnosis not present

## 2024-01-20 HISTORY — PX: IR US GUIDE VASC ACCESS LEFT: IMG2389

## 2024-01-20 HISTORY — PX: IR AV DIALY SHUNT INTRO NEEDLE/INTRACATH INITIAL W/PTA/IMG LEFT: IMG6103

## 2024-01-20 LAB — GLUCOSE, CAPILLARY: Glucose-Capillary: 195 mg/dL — ABNORMAL HIGH (ref 70–99)

## 2024-01-20 MED ORDER — LIDOCAINE-EPINEPHRINE 1 %-1:100000 IJ SOLN
20.0000 mL | Freq: Once | INTRAMUSCULAR | Status: AC
  Administered 2024-01-20: 10 mL via INTRADERMAL

## 2024-01-20 MED ORDER — MIDAZOLAM HCL 2 MG/2ML IJ SOLN
INTRAMUSCULAR | Status: AC
  Filled 2024-01-20: qty 2

## 2024-01-20 MED ORDER — FENTANYL CITRATE (PF) 100 MCG/2ML IJ SOLN
INTRAMUSCULAR | Status: AC | PRN
  Administered 2024-01-20 (×2): 50 ug via INTRAVENOUS

## 2024-01-20 MED ORDER — HEPARIN SODIUM (PORCINE) 1000 UNIT/ML IJ SOLN
INTRAMUSCULAR | Status: AC | PRN
  Administered 2024-01-20: 5000 [IU] via INTRAVENOUS

## 2024-01-20 MED ORDER — SODIUM CHLORIDE 0.9 % IV SOLN: INTRAVENOUS | Status: DC

## 2024-01-20 MED ORDER — LIDOCAINE-EPINEPHRINE 1 %-1:100000 IJ SOLN
INTRAMUSCULAR | Status: AC
  Filled 2024-01-20: qty 1

## 2024-01-20 MED ORDER — FENTANYL CITRATE (PF) 100 MCG/2ML IJ SOLN
INTRAMUSCULAR | Status: AC
  Filled 2024-01-20: qty 2

## 2024-01-20 MED ORDER — HEPARIN SODIUM (PORCINE) 1000 UNIT/ML IJ SOLN
INTRAMUSCULAR | Status: AC
  Filled 2024-01-20: qty 10

## 2024-01-20 MED ORDER — IOHEXOL 300 MG/ML  SOLN
100.0000 mL | Freq: Once | INTRAMUSCULAR | Status: AC | PRN
  Administered 2024-01-20: 60 mL via INTRA_ARTERIAL

## 2024-01-20 MED ORDER — MIDAZOLAM HCL (PF) 2 MG/2ML IJ SOLN
INTRAMUSCULAR | Status: AC | PRN
  Administered 2024-01-20 (×2): 1 mg via INTRAVENOUS

## 2024-01-20 NOTE — H&P (Addendum)
 "     Chief Complaint: Patient was seen in consultation today for prolonged bleeding from LUA loop graft  Referring Physician(s): Upton,Elizabeth  Supervising Physician: Hughes Simmonds  Patient Status: [facility] - Out-pt  History of Present Illness: Ryan Jenkins is a 64 y.o. male with past medical history of CAD, GERD, HTN, LBBB, and CKD on long-term HD via LUA loop graft who presents to [facility] Radiology today at the request of Dr. Gearline for prolonged bleeding after HD sessions.  Patient also has a R internal jugular tunneled catheter in place although this has not be used since his graft has been usable. LUA loop graft was created by Dr. Lanis 11/11/23 after his RUA fistula clotted.   Patient assessed at bedside today.  Confirms the above history and reports his graft has been in use for the past 1 month with episodes of prolonged bleeding post HD.  His tunn HD cath remains in place, but has not been used recently and at last use was pulling clots.  He is aware of the goals, risks, and benefits of the procedure today including the ultimate need for establishing reliable access at this time.  He is agreeable to proceed.   He has been NPO.   He is FULL CODE.   Past Medical History:  Diagnosis Date   Anemia    Anginal pain    Anxiety    Arthritis    BPH with obstruction/lower urinary tract symptoms    CAD (coronary artery disease)    cardiologist--- dr marcello badal;  11-06-2019 cardiac cath in [place]  (result in care everywhere)  nonobstructive cad involing pRCA 60% (done in setting worseing CHF/ acute pulmonary edema requiring intubation/ AKI   Chronic combined systolic and diastolic CHF (congestive heart failure) (HCC)    Chronic kidney disease, stage IV (severe) (HCC)    nephrologist--- dr dolan   Depression    Diabetic neuropathy (HCC)    Dyspnea    Edema of both lower extremities    Gastric ulcer without hemorrhage or perforation 12/25/2018   GERD (gastroesophageal reflux  disease)    Heart murmur    Hemodialysis patient    History of community acquired pneumonia    admission 06-04-2021 in [facility]  w/ ARF hypoxia w/ severe sepsis   Hyperlipidemia    Hypertension    Insulin  dependent type 2 diabetes mellitus (HCC)    LBBB (left bundle branch block)    Pneumonia    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)    Uses walker    Vitamin B12 deficiency    Vitreous hemorrhage [facility])     Past Surgical History:  Procedure Laterality Date   A/V FISTULAGRAM Right 07/17/2022   Procedure: A/V Fistulagram;  Surgeon: Eliza Lonni RAMAN, MD;  Location: [facility] INVASIVE CV LAB;  Service: Vascular;  Laterality: Right;   A/V FISTULAGRAM N/A 02/01/2023   Procedure: A/V Fistulagram;  Surgeon: Tobie Gordy POUR, MD;  Location: [facility] [place] [facility] INVASIVE CV LAB;  Service: Cardiovascular;  Laterality: N/A;   A/V SHUNT INTERVENTION N/A 05/10/2023   Procedure: A/V SHUNT INTERVENTION;  Surgeon: Melia Lynwood ORN, MD;  Location: [facility] INVASIVE CV LAB;  Service: Cardiovascular;  Laterality: N/A;   A/V SHUNT INTERVENTION N/A 06/21/2023   Procedure: A/V SHUNT INTERVENTION;  Surgeon: Serene Gaile ORN, MD;  Location: [facility] PV LAB;  Service: Cardiovascular;  Laterality: N/A;   AV FISTULA PLACEMENT Right 05/24/2022   Procedure: RADIOCEPHALIC ARTERIOVENOUS (AV) FISTULA CREATION;  Surgeon: Lanis Fonda BRAVO, MD;  Location: [facility] OR;  Service: Vascular;  Laterality: Right;   CARDIAC CATHETERIZATION  11/06/2019   [facility] in [place] ;    nonobstructive CAD , pRCA 60% (result in care everywhere)   CATARACT EXTRACTION W/ INTRAOCULAR LENS IMPLANT Bilateral 2017   DIALYSIS/PERMA CATHETER INSERTION N/A 10/03/2023   Procedure: DIALYSIS/PERMA CATHETER INSERTION;  Surgeon: Serene Gaile ORN, MD;  Location: [facility] PV LAB;  Service: Cardiovascular;  Laterality: N/A;   EXCHANGE OF A DIALYSIS CATHETER N/A 11/11/2023   Procedure: EXCHANGE OF A DIALYSIS CATHETER;  Surgeon: Lanis Fonda BRAVO, MD;  Location: [facility] OR;  Service: Vascular;   Laterality: N/A;   INSERTION OF ARTERIOVENOUS (AV) ARTEGRAFT ARM Left 11/11/2023   Procedure: INSERTION, GRAFT, ARTERIOVENOUS, UPPER EXTREMITY;  Surgeon: Lanis Fonda BRAVO, MD;  Location: [facility] OR;  Service: Vascular;  Laterality: Left;   IR FLUORO GUIDE CV LINE RIGHT  05/23/2022   IR US  GUIDE VASC ACCESS RIGHT  05/23/2022   PATCH ANGIOPLASTY Right 09/18/2022   Procedure: BOVINE PATCH ANGIOPLASTY 1CM x 6CM;  Surgeon: Eliza Lonni RAMAN, MD;  Location: [facility] OR;  Service: Vascular;  Laterality: Right;   PERIPHERAL VASCULAR BALLOON ANGIOPLASTY Right 02/01/2023   Procedure: PERIPHERAL VASCULAR BALLOON ANGIOPLASTY;  Surgeon: Tobie Gordy POUR, MD;  Location: [facility] INVASIVE CV LAB;  Service: Cardiovascular;  Laterality: Right;  AA and Cphalic inflow vein   REVISON OF ARTERIOVENOUS FISTULA Right 09/18/2022   Procedure: REVISON OF RIGHT UPPER ARM ARTERIOVENOUS FISTULA;  Surgeon: Eliza Lonni RAMAN, MD;  Location: [facility] OR;  Service: Vascular;  Laterality: Right;   TRANSURETHRAL RESECTION OF PROSTATE N/A 10/16/2021   Procedure: TRANSURETHRAL RESECTION OF THE PROSTATE (TURP);  Surgeon: Selma Donnice SAUNDERS, MD;  Location: [facility] ORS;  Service: Urology;  Laterality: N/A;   VENOUS ANGIOPLASTY Right 05/10/2023   Procedure: VENOUS ANGIOPLASTY;  Surgeon: Melia Lynwood ORN, MD;  Location: [facility] INVASIVE CV LAB;  Service: Cardiovascular;  Laterality: Right;  70% Cephalic Arch    Allergies: Claritin [loratadine], Latex, Hydrochlorothiazide , Lyrica [pregabalin], and Metformin and related  Medications: Prior to Admission medications  Medication Sig Start Date End Date Taking? Authorizing Provider  acetaminophen  (TYLENOL ) 650 MG CR tablet Take 650 mg by mouth every 8 (eight) hours as needed for pain.   Yes [provider]  amLODipine  (NORVASC ) 5 MG tablet Take 10 mg on Non Dialysis days. On Dialysis Days, Take 5 mg After Dialysis. 10/15/23  Yes Croitoru, Mihai, MD  BELSOMRA 5 MG TABS Take 1 tablet by mouth at bedtime as needed  (Sleep). 04/29/23  Yes [provider]  calcium  acetate (PHOSLO) 667 MG capsule Take 1,334 mg by mouth 3 (three) times daily with meals.   Yes [provider]  carvedilol  (COREG ) 6.25 MG tablet Take 1 tablet (6.25 mg total) by mouth 2 (two) times daily. 10/15/23  Yes Croitoru, Mihai, MD  famotidine  (PEPCID ) 40 MG tablet Take 40 mg by mouth at bedtime as needed for heartburn.   Yes [provider]  furosemide  (LASIX ) 40 MG tablet Take 40 mg by mouth daily. 05/24/22  Yes [provider]  glimepiride (AMARYL) 1 MG tablet Take 1 mg by mouth daily. 10/10/23  Yes [provider]  LANTUS  SOLOSTAR 100 UNIT/ML Solostar Pen Inject 5 Units into the skin at bedtime. Patient taking differently: Inject 15 Units into the skin at bedtime. 03/29/22  Yes Dahal, Chapman, MD  lidocaine  (LIDODERM ) 5 % Place 1 patch onto the skin daily. Remove & Discard patch within 12 hours or as directed by MD Patient taking differently: Place  1 patch onto the skin daily as needed (Pain). Remove & Discard patch within 12 hours or as directed by MD 10/11/23  Yes Ula Prentice SAUNDERS, MD  meclizine  (ANTIVERT ) 25 MG tablet Take 25 mg by mouth at bedtime. 01/10/23  Yes [provider]  methocarbamol  (ROBAXIN ) 500 MG tablet Take 500 mg by mouth 2 (two) times daily as needed for muscle spasms. 10/07/23  Yes [provider]  multivitamin (RENA-VIT) TABS tablet Take 1 tablet by mouth daily in the afternoon.   Yes [provider]  ondansetron  (ZOFRAN -ODT) 4 MG disintegrating tablet Take 1 tablet (4 mg total) by mouth every 8 (eight) hours as needed. 11/12/23  Yes Patsey Lot, MD  oxyCODONE -acetaminophen  (PERCOCET/ROXICET) 5-325 MG tablet Take 1 tablet by mouth every 6 (six) hours as needed for severe pain (pain score 7-10). 11/11/23  Yes Rhyne, Samantha J, PA-C  pantoprazole  (PROTONIX ) 20 MG tablet Take 20 mg by mouth every morning. 08/26/23  Yes [provider]  pioglitazone  (ACTOS) 15 MG tablet Take 15 mg by mouth daily. 10/25/22  Yes [provider]  pregabalin (LYRICA) 50 MG capsule Take 50 mg by mouth daily.   Yes [provider]  rosuvastatin  (CRESTOR ) 10 MG tablet Take 10 mg by mouth at bedtime.   Yes [provider]  sevelamer  carbonate (RENVELA ) 800 MG tablet Take 1,600 mg by mouth 3 (three) times daily with meals.   Yes [provider]  tamsulosin  (FLOMAX ) 0.4 MG CAPS capsule Take 0.8 mg by mouth daily.   Yes [provider]  tiZANidine  (ZANAFLEX ) 2 MG tablet Take 1 tablet (2 mg total) by mouth daily. Patient taking differently: Take 2 mg by mouth daily as needed for muscle spasms. 10/11/23  Yes Ula Prentice SAUNDERS, MD  acetaminophen -codeine  (TYLENOL  #2) 300-15 MG tablet Take 1 tablet by mouth every 8 (eight) hours as needed for moderate pain (pain score 4-6). 10/09/23   [provider]  polyethylene glycol (MIRALAX  / GLYCOLAX ) 17 g packet Take 17 g by mouth daily as needed (constipation).    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 Right Handed    Are you currently employed ? 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 No    Who lives with you? With brother    What type of home do you live in: 1 story or 2 story? Lives in a one story apartment.       Social Drivers of Health   Tobacco Use: Low Risk (01/20/2024)   Patient History    Smoking Tobacco Use: Never    Smokeless Tobacco Use: Never    Passive Exposure: Never  Financial Resource Strain: Low Risk (01/18/2022)   Received from [facility]   Overall Financial Resource Strain (CARDIA)    Difficulty of Paying Living Expenses: Not very hard  Food Insecurity: No Food Insecurity (07/03/2023)   Epic    Worried About Programme Researcher, Broadcasting/film/video in the Last Year: Never true    Ran Out of Food in the Last Year: Never true  Transportation Needs: No Transportation Needs (07/03/2023)   Epic    Lack of Transportation (Medical): No    Lack of Transportation (Non-Medical): No  Physical Activity: Not on file  Stress: Not on file  Social Connections: Not on file  Depression (PHQ2-9): Low Risk (12/26/2022)   Depression (PHQ2-9)    PHQ-2 Score: 1  Alcohol Screen: Not on file  Housing: Low Risk (07/03/2023)   Epic    Unable to Pay for Housing in the Last Year: No    Number of Times Moved in the Last Year: 1    Homeless in the Last Year: No  Utilities: Not At Risk (07/03/2023)   Epic    Threatened with loss of utilities: No  Health Literacy: Not on file     Review of Systems: A 12 point ROS discussed and pertinent positives are indicated in the HPI above.  All other systems are negative.  Review of Systems  Constitutional:  Negative for fatigue and fever.  Respiratory:  Negative for cough and shortness of breath.   Cardiovascular:  Negative for chest pain.  Gastrointestinal:  Negative for abdominal pain.  Musculoskeletal:  Negative for back  pain.  Psychiatric/Behavioral:  Negative for behavioral problems and confusion.     Vital Signs: BP (!) 174/84   Pulse 100   Temp 98.2 F (36.8 C)   Resp 16   Ht 6' 2 (1.88 m)   Wt 212 lb (96.2 kg)   SpO2 100%   BMI 27.22 kg/m   Physical Exam Vitals and nursing note reviewed.  Constitutional:      General: He is not in acute distress. HENT:     Mouth/Throat:     Mouth: Mucous membranes are moist.     Pharynx: Oropharynx is clear.  Cardiovascular:     Rate and Rhythm: Normal rate.  Pulmonary:     Effort: Pulmonary effort is normal.     Breath sounds: Normal breath sounds.  Musculoskeletal:     Comments: RUA fistula dormant.  LUA intact with palpable and audible bruit throughout.  No palpable thrill.    Skin:    General: Skin is warm and dry.  Neurological:     General: No focal deficit present.     Mental Status: He is alert and oriented to person, place, and time. Mental status is at baseline.  Psychiatric:        Mood and Affect: Mood normal.        Behavior: Behavior normal.        Thought Content: Thought content normal.  Judgment: Judgment normal.      MD Evaluation Airway: WNL Heart: WNL Abdomen: WNL Chest/ Lungs: WNL ASA  Classification: 3 Mallampati/Airway Score: Two   Imaging: VAS US  DUPLEX DIALYSIS ACCESS (AVF,AVG) Result Date: 12/30/2023 DIALYSIS ACCESS Patient Name:  Ryan Jenkins  Date of Exam:   12/30/2023 Medical Rec #: [id number]          Accession #:    [id number] Date of Birth: 02/04/1960          Patient Gender: M Patient Age:   22 years Exam Location:  [address] Procedure:      VAS US  DUPLEX DIALYSIS ACCESS (AVF, AVG) Referring Phys: JOSHUA ROBINS --------------------------------------------------------------------------------  Reason for Exam: Routine follow up. Access Site: Left Upper Extremity. Access Type: Upper arm loop AVG. History: Left upper arm AV loop graft 11/11/2023. Performing Technologist: Geni Lodge RVS,  RCS  Examination Guidelines: A complete evaluation includes B-mode imaging, spectral Doppler, color Doppler, and power Doppler as needed of all accessible portions of each vessel. Unilateral testing is considered an integral part of a complete examination. Limited examinations for reoccurring indications may be performed as noted.  Findings:   +--------------------+----------+-----------------+--------+ AVG                 PSV (cm/s)Flow Vol (mL/min)Describe +--------------------+----------+-----------------+--------+ Native artery inflow   240                              +--------------------+----------+-----------------+--------+ Arterial anastomosis   311                              +--------------------+----------+-----------------+--------+ Prox graft             256                              +--------------------+----------+-----------------+--------+ Mid graft               83                              +--------------------+----------+-----------------+--------+ Distal graft            77                              +--------------------+----------+-----------------+--------+ Venous anastomosis     631                              +--------------------+----------+-----------------+--------+ Venous outflow         189                              +--------------------+----------+-----------------+--------+  Summary: Patent arteriovenous graft with elevated velocities involving the venous anastomosis. *See table(s) above for measurements and observations.  Diagnosing physician: Debby Robertson Electronically signed by Debby Robertson on 12/30/2023 at 8:06:14 PM.    --------------------------------------------------------------------------------   Final     Labs:  CBC: Recent Labs    07/04/23 0531 10/11/23 1109 10/25/23 1458 11/11/23 1022 11/12/23 1755  WBC 6.4 6.5 7.0  --  7.6  HGB 11.4* 12.4* 12.8* 12.6* 11.9*  HCT 33.7* 37.6* 39.0 37.0* 36.9*  PLT  295 266 246  --  198    COAGS: No results for input(s): INR, APTT in the last 8760 hours.  BMP: Recent Labs    07/04/23 0531 10/11/23 1109 10/25/23 1458 11/11/23 1022 11/12/23 1755  NA 135 136 135 138 137  K 3.1* 4.3 3.8 4.2 4.1  CL 95* 98 98 101 98  CO2 32 21* 23  --  24  GLUCOSE 101* 207* 119* 158* 161*  BUN 17 19 33* 38* 23  CALCIUM  8.5* 9.0 8.9  --  8.7*  CREATININE 4.34* 5.29* 6.82* 8.20* 5.65*  GFRNONAA 15* 11* 8*  --  11*    LIVER FUNCTION TESTS: Recent Labs    10/25/23 1458 11/12/23 1755  BILITOT 0.7 0.5  AST 11* 14*  ALT 9 7  ALKPHOS 44 48  PROT 7.6 7.3  ALBUMIN  3.6 3.5    TUMOR MARKERS: No results for input(s): AFPTM, CEA, CA199, CHROMGRNA in the last 8760 hours.  Assessment and Plan: Patient with past medical history of LUA loop graft created 10/2023 presents with complaint of prolonged bleeding after HD.  IR consulted for fistulagram with intervention at the request of Dr. Gearline. Case reviewed by Dr. Hughes who approves patient for procedure.  Patient presents today in their usual state of health.  He has been NPO and is not currently on blood thinners.  Patient last had successful dialysis on Saturday.    Risks and benefits discussed with the patient including, but not limited to bleeding, infection, vascular injury, pulmonary embolism, need for tunneled HD catheter placement or even death.  All of the patient's questions were answered, patient is agreeable to proceed. Consent signed and in chart.   Thank you for this interesting consult.  I greatly enjoyed meeting Ryan Jenkins and look forward to participating in their care.  A copy of this report was sent to the requesting provider on this date.  Electronically Signed: Lanier Millon Sue-Ellen Dashawn Bartnick, PA 01/20/2024, 10:45 AM   I spent a total of  30 Minutes   in face to face in clinical consultation, greater than 50% of which was counseling/coordinating care for LUA loop graft  malfunction.   "

## 2024-01-20 NOTE — Procedures (Addendum)
 Vascular and Interventional Radiology Procedure Note  Patient: Ryan Jenkins DOB: December 27, 1960 Medical Record Number: [id number] Note Date/Time: 01/20/2024 12:22 PM   Performing Physician: Thom Hall, MD Assistant(s): None  Diagnosis: HD access malfunction. Prolonged bleeding.   Procedure:  LEFT UPPER EXTREMITY VENOGRAM DIALYSIS CIRCUIT STUDY / FISTULAGRAM ANGIOPLASTY OF OUTFLOW VEIN STENOSIS   Anesthesia: Conscious Sedation Complications: None Estimated Blood Loss: Minimal Specimens: None   Findings:  - access antegrade at LUE AVG. - LUE axillary AVG with patent AA. Severe axillary outflow vein anastomosis stenosis.  - PTA w 7 mm balloon with success.  Final report to follow once all images are reviewed and compared with previous studies.  See detailed dictation with images in PACS. The patient tolerated the procedure well without incident or complication and was returned to Recovery in stable condition.    Thom Hall, MD [facility] Inter[facility] Rad[facility]   Pager. [phone] Clinic. [phone]

## 2024-01-21 ENCOUNTER — Telehealth (HOSPITAL_BASED_OUTPATIENT_CLINIC_OR_DEPARTMENT_OTHER): Payer: Self-pay | Admitting: *Deleted

## 2024-01-21 ENCOUNTER — Other Ambulatory Visit: Payer: Self-pay | Admitting: Gastroenterology

## 2024-01-21 NOTE — Telephone Encounter (Signed)
" ° °  Name: Ryan Jenkins  DOB: [birth year]  MRN: [id number]  Primary Cardiologist: None  Chart reviewed as part of pre-operative protocol coverage. Because of Ryan Jenkins past medical history and time since last visit, he will require a follow-up in-office visit in order to better assess preoperative cardiovascular risk. No showed one month follow up visit with Glendia Ferrier, PA.   Pre-op  covering staff: - Please schedule appointment and call patient to inform them. If patient already had an upcoming appointment within acceptable timeframe, please add pre-op  clearance to the appointment notes so provider is aware. - Please contact requesting surgeon's office via preferred method (i.e, phone, fax) to inform them of need for appointment prior to surgery.  Woodroe Vogan D Khamille Beynon, NP  01/21/2024, 12:05 PM   "

## 2024-01-21 NOTE — Telephone Encounter (Signed)
 Left message for the pt to call back and schedule an appt in office. Needs preop clearance as well as Dr. Francyne ov notes pt was to f/u in 1 month which pt no showed.

## 2024-01-21 NOTE — Telephone Encounter (Signed)
"  ° °  Pre-operative Risk Assessment    Patient Name: Ryan Jenkins  DOB: [birth year] MRN: [id number]   Date of last office visit: 10/15/23 DR. CROITORU Date of next office visit: NONE   Request for Surgical Clearance    Procedure:  COLONOSCOPY AND ENDOSCOPY  Date of Surgery:  Clearance 03/30/24                                Surgeon:  DR. DIANNA Surgeon's Group or Practice Name:  [facility]  Phone number:  [phone] Fax number:  [phone]   Type of Clearance Requested:   - Medical ; PER FORM THE PROCEDURE WILL BE DONE IN THE HOSPITAL    Type of Anesthesia:  PROPOFOL     Additional requests/questions:    Bonney Niels Jest   01/21/2024, 11:30 AM   "

## 2024-01-21 NOTE — Telephone Encounter (Signed)
 Pt scheduled for OV on 2/9

## 2024-01-30 ENCOUNTER — Telehealth (HOSPITAL_COMMUNITY): Payer: Self-pay

## 2024-01-30 NOTE — Telephone Encounter (Signed)
LMOM for pt to callback for scheduling

## 2024-02-05 ENCOUNTER — Ambulatory Visit (HOSPITAL_COMMUNITY)
Admission: RE | Admit: 2024-02-05 | Discharge: 2024-02-05 | Disposition: A | Source: Ambulatory Visit | Attending: Nephrology | Admitting: Nephrology

## 2024-02-05 DIAGNOSIS — N186 End stage renal disease: Secondary | ICD-10-CM | POA: Diagnosis not present

## 2024-02-05 DIAGNOSIS — Z4901 Encounter for fitting and adjustment of extracorporeal dialysis catheter: Secondary | ICD-10-CM | POA: Insufficient documentation

## 2024-02-05 HISTORY — PX: IR REMOVAL TUN CV CATH W/O FL: IMG2289

## 2024-02-05 MED ORDER — LIDOCAINE-EPINEPHRINE 1 %-1:100000 IJ SOLN
INTRAMUSCULAR | Status: AC
  Filled 2024-02-05: qty 20

## 2024-02-05 MED ORDER — LIDOCAINE-EPINEPHRINE 1 %-1:100000 IJ SOLN
20.0000 mL | Freq: Once | INTRAMUSCULAR | Status: AC
  Administered 2024-02-05: 10 mL via INTRADERMAL

## 2024-02-05 NOTE — Procedures (Signed)
 PROCEDURE SUMMARY:  Successful removal of tunneled hemodialysis catheter.  Patient tolerated well.  EBL < 1 mL  See full dictation in Imaging for details.  Tommaso Cavitt H Brendaly Townsel PA-C 02/05/2024 10:26 AM

## 2024-02-24 ENCOUNTER — Ambulatory Visit: Admitting: Emergency Medicine

## 2024-03-30 ENCOUNTER — Encounter (HOSPITAL_COMMUNITY): Payer: Self-pay

## 2024-03-30 ENCOUNTER — Ambulatory Visit (HOSPITAL_COMMUNITY): Admit: 2024-03-30 | Admitting: Gastroenterology

## 2024-03-30 SURGERY — COLONOSCOPY
Anesthesia: Monitor Anesthesia Care
